# Patient Record
Sex: Female | Born: 1965 | Race: Black or African American | Hispanic: No | Smoking: Current every day smoker
Health system: Southern US, Community
[De-identification: ages and names within clinical notes are randomized; demographics above are authoritative.]

## PROBLEM LIST (undated history)

## (undated) DIAGNOSIS — F1910 Other psychoactive substance abuse, uncomplicated: Secondary | ICD-10-CM

## (undated) DIAGNOSIS — I219 Acute myocardial infarction, unspecified: Secondary | ICD-10-CM

## (undated) DIAGNOSIS — R0601 Orthopnea: Secondary | ICD-10-CM

## (undated) DIAGNOSIS — E559 Vitamin D deficiency, unspecified: Secondary | ICD-10-CM

## (undated) DIAGNOSIS — N200 Calculus of kidney: Secondary | ICD-10-CM

## (undated) DIAGNOSIS — M1990 Unspecified osteoarthritis, unspecified site: Secondary | ICD-10-CM

## (undated) DIAGNOSIS — E1042 Type 1 diabetes mellitus with diabetic polyneuropathy: Secondary | ICD-10-CM

## (undated) DIAGNOSIS — E08621 Diabetes mellitus due to underlying condition with foot ulcer: Secondary | ICD-10-CM

## (undated) DIAGNOSIS — K3184 Gastroparesis: Secondary | ICD-10-CM

## (undated) DIAGNOSIS — Z8719 Personal history of other diseases of the digestive system: Secondary | ICD-10-CM

## (undated) DIAGNOSIS — I10 Essential (primary) hypertension: Secondary | ICD-10-CM

## (undated) DIAGNOSIS — F329 Major depressive disorder, single episode, unspecified: Secondary | ICD-10-CM

## (undated) DIAGNOSIS — Z8711 Personal history of peptic ulcer disease: Secondary | ICD-10-CM

## (undated) DIAGNOSIS — K2210 Ulcer of esophagus without bleeding: Secondary | ICD-10-CM

## (undated) DIAGNOSIS — N181 Chronic kidney disease, stage 1: Secondary | ICD-10-CM

## (undated) DIAGNOSIS — E1069 Type 1 diabetes mellitus with other specified complication: Secondary | ICD-10-CM

## (undated) DIAGNOSIS — E119 Type 2 diabetes mellitus without complications: Secondary | ICD-10-CM

## (undated) DIAGNOSIS — A6000 Herpesviral infection of urogenital system, unspecified: Secondary | ICD-10-CM

## (undated) DIAGNOSIS — R51 Headache: Secondary | ICD-10-CM

## (undated) DIAGNOSIS — N289 Disorder of kidney and ureter, unspecified: Secondary | ICD-10-CM

## (undated) DIAGNOSIS — I200 Unstable angina: Secondary | ICD-10-CM

## (undated) DIAGNOSIS — I2119 ST elevation (STEMI) myocardial infarction involving other coronary artery of inferior wall: Secondary | ICD-10-CM

## (undated) DIAGNOSIS — I639 Cerebral infarction, unspecified: Secondary | ICD-10-CM

## (undated) DIAGNOSIS — E1143 Type 2 diabetes mellitus with diabetic autonomic (poly)neuropathy: Secondary | ICD-10-CM

## (undated) DIAGNOSIS — L723 Sebaceous cyst: Secondary | ICD-10-CM

## (undated) DIAGNOSIS — N1831 Chronic kidney disease, stage 3a: Secondary | ICD-10-CM

## (undated) DIAGNOSIS — F319 Bipolar disorder, unspecified: Secondary | ICD-10-CM

## (undated) DIAGNOSIS — D649 Anemia, unspecified: Secondary | ICD-10-CM

## (undated) DIAGNOSIS — I2129 ST elevation (STEMI) myocardial infarction involving other sites: Secondary | ICD-10-CM

## (undated) DIAGNOSIS — E1142 Type 2 diabetes mellitus with diabetic polyneuropathy: Secondary | ICD-10-CM

## (undated) DIAGNOSIS — K219 Gastro-esophageal reflux disease without esophagitis: Secondary | ICD-10-CM

## (undated) DIAGNOSIS — E1122 Type 2 diabetes mellitus with diabetic chronic kidney disease: Secondary | ICD-10-CM

## (undated) DIAGNOSIS — F419 Anxiety disorder, unspecified: Secondary | ICD-10-CM

## (undated) DIAGNOSIS — N179 Acute kidney failure, unspecified: Secondary | ICD-10-CM

## (undated) DIAGNOSIS — I209 Angina pectoris, unspecified: Secondary | ICD-10-CM

## (undated) DIAGNOSIS — J189 Pneumonia, unspecified organism: Secondary | ICD-10-CM

## (undated) DIAGNOSIS — R011 Cardiac murmur, unspecified: Secondary | ICD-10-CM

## (undated) DIAGNOSIS — K222 Esophageal obstruction: Secondary | ICD-10-CM

## (undated) DIAGNOSIS — M069 Rheumatoid arthritis, unspecified: Secondary | ICD-10-CM

## (undated) DIAGNOSIS — G43909 Migraine, unspecified, not intractable, without status migrainosus: Secondary | ICD-10-CM

## (undated) DIAGNOSIS — F32A Depression, unspecified: Secondary | ICD-10-CM

## (undated) DIAGNOSIS — E1043 Type 1 diabetes mellitus with diabetic autonomic (poly)neuropathy: Secondary | ICD-10-CM

## (undated) DIAGNOSIS — E785 Hyperlipidemia, unspecified: Secondary | ICD-10-CM

## (undated) DIAGNOSIS — L97509 Non-pressure chronic ulcer of other part of unspecified foot with unspecified severity: Secondary | ICD-10-CM

## (undated) DIAGNOSIS — R519 Headache, unspecified: Secondary | ICD-10-CM

## (undated) DIAGNOSIS — I2510 Atherosclerotic heart disease of native coronary artery without angina pectoris: Secondary | ICD-10-CM

## (undated) HISTORY — DX: Esophageal obstruction: K22.2

## (undated) HISTORY — DX: Type 1 diabetes mellitus with other specified complication: E10.69

## (undated) HISTORY — DX: Gastro-esophageal reflux disease without esophagitis: K21.9

## (undated) HISTORY — DX: Ulcer of esophagus without bleeding: K22.10

## (undated) HISTORY — DX: Type 1 diabetes mellitus with diabetic autonomic (poly)neuropathy: K31.84

## (undated) HISTORY — DX: Disorder of kidney and ureter, unspecified: N28.9

## (undated) HISTORY — DX: Type 1 diabetes mellitus with diabetic autonomic (poly)neuropathy: E10.43

## (undated) HISTORY — DX: Type 1 diabetes mellitus with diabetic polyneuropathy: E10.42

## (undated) HISTORY — DX: Gastroparesis: K31.84

## (undated) HISTORY — DX: Anxiety disorder, unspecified: F41.9

## (undated) HISTORY — DX: Chronic kidney disease, stage 1: E11.22

## (undated) HISTORY — DX: Vitamin D deficiency, unspecified: E55.9

## (undated) HISTORY — DX: Type 2 diabetes mellitus with diabetic polyneuropathy: E11.42

## (undated) HISTORY — DX: Atherosclerotic heart disease of native coronary artery without angina pectoris: I25.10

## (undated) HISTORY — DX: Calculus of kidney: N20.0

## (undated) HISTORY — PX: EYE SURGERY: SHX253

## (undated) HISTORY — DX: Sebaceous cyst: L72.3

## (undated) HISTORY — DX: Acute kidney failure, unspecified: N17.9

## (undated) HISTORY — DX: Depression, unspecified: F32.A

## (undated) HISTORY — DX: Non-pressure chronic ulcer of other part of unspecified foot with unspecified severity: L97.509

## (undated) HISTORY — DX: Major depressive disorder, single episode, unspecified: F32.9

## (undated) HISTORY — DX: Herpesviral infection of urogenital system, unspecified: A60.00

## (undated) HISTORY — DX: Type 2 diabetes mellitus with diabetic autonomic (poly)neuropathy: E11.43

## (undated) HISTORY — DX: Diabetes mellitus due to underlying condition with foot ulcer: E08.621

## (undated) HISTORY — DX: Chronic kidney disease, stage 3a: N18.31

## (undated) HISTORY — DX: Type 2 diabetes mellitus with diabetic autonomic (poly)neuropathy: K31.84

## (undated) HISTORY — DX: Other psychoactive substance abuse, uncomplicated: F19.10

## (undated) HISTORY — DX: Unstable angina: I20.0

## (undated) HISTORY — DX: Hyperlipidemia, unspecified: E78.5

## (undated) HISTORY — DX: Type 2 diabetes mellitus with diabetic chronic kidney disease: N18.1

## (undated) HISTORY — DX: Anemia, unspecified: D64.9

## (undated) HISTORY — DX: Rheumatoid arthritis, unspecified: M06.9

---

## 1998-12-07 ENCOUNTER — Emergency Department (HOSPITAL_COMMUNITY): Admission: EM | Admit: 1998-12-07 | Discharge: 1998-12-07 | Payer: Self-pay | Admitting: Emergency Medicine

## 1998-12-07 ENCOUNTER — Encounter: Payer: Self-pay | Admitting: Emergency Medicine

## 1998-12-08 ENCOUNTER — Inpatient Hospital Stay (HOSPITAL_COMMUNITY): Admission: EM | Admit: 1998-12-08 | Discharge: 1998-12-12 | Payer: Self-pay | Admitting: Emergency Medicine

## 1998-12-09 ENCOUNTER — Encounter: Payer: Self-pay | Admitting: Internal Medicine

## 1999-08-31 ENCOUNTER — Encounter: Payer: Self-pay | Admitting: Emergency Medicine

## 1999-08-31 ENCOUNTER — Emergency Department (HOSPITAL_COMMUNITY): Admission: EM | Admit: 1999-08-31 | Discharge: 1999-08-31 | Payer: Self-pay | Admitting: Emergency Medicine

## 1999-09-05 ENCOUNTER — Emergency Department (HOSPITAL_COMMUNITY): Admission: EM | Admit: 1999-09-05 | Discharge: 1999-09-05 | Payer: Self-pay | Admitting: Emergency Medicine

## 1999-12-31 ENCOUNTER — Encounter: Payer: Self-pay | Admitting: Emergency Medicine

## 1999-12-31 ENCOUNTER — Inpatient Hospital Stay (HOSPITAL_COMMUNITY): Admission: EM | Admit: 1999-12-31 | Discharge: 2000-01-03 | Payer: Self-pay | Admitting: Emergency Medicine

## 2000-01-11 ENCOUNTER — Encounter: Admission: RE | Admit: 2000-01-11 | Discharge: 2000-01-11 | Payer: Self-pay | Admitting: Hematology and Oncology

## 2000-07-28 ENCOUNTER — Emergency Department (HOSPITAL_COMMUNITY): Admission: EM | Admit: 2000-07-28 | Discharge: 2000-07-28 | Payer: Self-pay | Admitting: Emergency Medicine

## 2000-07-31 ENCOUNTER — Emergency Department (HOSPITAL_COMMUNITY): Admission: EM | Admit: 2000-07-31 | Discharge: 2000-07-31 | Payer: Self-pay | Admitting: Emergency Medicine

## 2000-08-03 ENCOUNTER — Inpatient Hospital Stay (HOSPITAL_COMMUNITY): Admission: EM | Admit: 2000-08-03 | Discharge: 2000-08-09 | Payer: Self-pay | Admitting: Emergency Medicine

## 2000-08-05 ENCOUNTER — Encounter: Payer: Self-pay | Admitting: Pulmonary Disease

## 2000-08-05 ENCOUNTER — Encounter: Payer: Self-pay | Admitting: Internal Medicine

## 2000-08-06 ENCOUNTER — Encounter: Payer: Self-pay | Admitting: Internal Medicine

## 2000-08-06 ENCOUNTER — Encounter: Payer: Self-pay | Admitting: Pulmonary Disease

## 2000-10-30 ENCOUNTER — Encounter: Payer: Self-pay | Admitting: Emergency Medicine

## 2000-10-30 ENCOUNTER — Inpatient Hospital Stay (HOSPITAL_COMMUNITY): Admission: EM | Admit: 2000-10-30 | Discharge: 2000-11-08 | Payer: Self-pay | Admitting: Emergency Medicine

## 2000-11-01 ENCOUNTER — Encounter: Payer: Self-pay | Admitting: Internal Medicine

## 2000-11-07 ENCOUNTER — Encounter: Payer: Self-pay | Admitting: Internal Medicine

## 2000-11-10 ENCOUNTER — Inpatient Hospital Stay (HOSPITAL_COMMUNITY): Admission: EM | Admit: 2000-11-10 | Discharge: 2000-11-16 | Payer: Self-pay | Admitting: Emergency Medicine

## 2000-11-10 ENCOUNTER — Encounter: Payer: Self-pay | Admitting: Internal Medicine

## 2000-11-13 ENCOUNTER — Encounter: Payer: Self-pay | Admitting: Internal Medicine

## 2000-11-20 ENCOUNTER — Encounter: Admission: RE | Admit: 2000-11-20 | Discharge: 2000-11-20 | Payer: Self-pay | Admitting: Internal Medicine

## 2000-11-28 ENCOUNTER — Encounter: Admission: RE | Admit: 2000-11-28 | Discharge: 2000-11-28 | Payer: Self-pay

## 2001-03-20 ENCOUNTER — Encounter: Payer: Self-pay | Admitting: Internal Medicine

## 2001-03-20 ENCOUNTER — Encounter: Payer: Self-pay | Admitting: Emergency Medicine

## 2001-03-20 ENCOUNTER — Inpatient Hospital Stay (HOSPITAL_COMMUNITY): Admission: EM | Admit: 2001-03-20 | Discharge: 2001-03-24 | Payer: Self-pay | Admitting: Emergency Medicine

## 2001-03-21 ENCOUNTER — Encounter: Payer: Self-pay | Admitting: Internal Medicine

## 2001-03-25 ENCOUNTER — Emergency Department (HOSPITAL_COMMUNITY): Admission: EM | Admit: 2001-03-25 | Discharge: 2001-03-25 | Payer: Self-pay

## 2001-03-29 ENCOUNTER — Encounter: Payer: Self-pay | Admitting: Emergency Medicine

## 2001-03-29 ENCOUNTER — Inpatient Hospital Stay (HOSPITAL_COMMUNITY): Admission: EM | Admit: 2001-03-29 | Discharge: 2001-04-02 | Payer: Self-pay | Admitting: Emergency Medicine

## 2001-04-01 ENCOUNTER — Encounter: Payer: Self-pay | Admitting: Internal Medicine

## 2001-05-28 ENCOUNTER — Encounter: Payer: Self-pay | Admitting: Internal Medicine

## 2001-05-28 ENCOUNTER — Inpatient Hospital Stay (HOSPITAL_COMMUNITY): Admission: EM | Admit: 2001-05-28 | Discharge: 2001-05-30 | Payer: Self-pay | Admitting: Emergency Medicine

## 2001-05-28 ENCOUNTER — Encounter: Payer: Self-pay | Admitting: Emergency Medicine

## 2001-05-29 ENCOUNTER — Encounter: Payer: Self-pay | Admitting: Internal Medicine

## 2001-06-12 ENCOUNTER — Encounter: Payer: Self-pay | Admitting: Emergency Medicine

## 2001-06-12 ENCOUNTER — Inpatient Hospital Stay (HOSPITAL_COMMUNITY): Admission: EM | Admit: 2001-06-12 | Discharge: 2001-06-16 | Payer: Self-pay | Admitting: Emergency Medicine

## 2001-06-13 ENCOUNTER — Encounter: Payer: Self-pay | Admitting: Family Medicine

## 2001-06-20 ENCOUNTER — Encounter: Admission: RE | Admit: 2001-06-20 | Discharge: 2001-06-20 | Payer: Self-pay | Admitting: Family Medicine

## 2001-06-24 ENCOUNTER — Inpatient Hospital Stay (HOSPITAL_COMMUNITY): Admission: EM | Admit: 2001-06-24 | Discharge: 2001-06-27 | Payer: Self-pay | Admitting: *Deleted

## 2001-06-24 ENCOUNTER — Encounter: Payer: Self-pay | Admitting: *Deleted

## 2001-06-24 ENCOUNTER — Encounter: Payer: Self-pay | Admitting: Internal Medicine

## 2001-09-17 ENCOUNTER — Encounter: Payer: Self-pay | Admitting: Emergency Medicine

## 2001-09-17 ENCOUNTER — Inpatient Hospital Stay (HOSPITAL_COMMUNITY): Admission: EM | Admit: 2001-09-17 | Discharge: 2001-09-22 | Payer: Self-pay

## 2001-09-18 ENCOUNTER — Encounter: Payer: Self-pay | Admitting: Family Medicine

## 2001-09-19 ENCOUNTER — Encounter: Payer: Self-pay | Admitting: Family Medicine

## 2002-01-01 ENCOUNTER — Encounter: Payer: Self-pay | Admitting: Internal Medicine

## 2002-01-01 ENCOUNTER — Inpatient Hospital Stay (HOSPITAL_COMMUNITY): Admission: EM | Admit: 2002-01-01 | Discharge: 2002-01-04 | Payer: Self-pay | Admitting: Emergency Medicine

## 2002-09-22 ENCOUNTER — Encounter: Admission: RE | Admit: 2002-09-22 | Discharge: 2002-09-22 | Payer: Self-pay | Admitting: Family Medicine

## 2002-09-22 ENCOUNTER — Encounter: Payer: Self-pay | Admitting: Family Medicine

## 2003-01-30 ENCOUNTER — Encounter: Payer: Self-pay | Admitting: Emergency Medicine

## 2003-01-30 ENCOUNTER — Observation Stay (HOSPITAL_COMMUNITY): Admission: EM | Admit: 2003-01-30 | Discharge: 2003-01-31 | Payer: Self-pay

## 2003-07-11 ENCOUNTER — Emergency Department (HOSPITAL_COMMUNITY): Admission: EM | Admit: 2003-07-11 | Discharge: 2003-07-12 | Payer: Self-pay | Admitting: Emergency Medicine

## 2004-04-05 ENCOUNTER — Ambulatory Visit: Payer: Self-pay | Admitting: *Deleted

## 2004-04-18 ENCOUNTER — Ambulatory Visit: Payer: Self-pay | Admitting: Family Medicine

## 2004-05-15 ENCOUNTER — Inpatient Hospital Stay (HOSPITAL_COMMUNITY): Admission: EM | Admit: 2004-05-15 | Discharge: 2004-05-19 | Payer: Self-pay | Admitting: Emergency Medicine

## 2004-05-15 ENCOUNTER — Ambulatory Visit: Payer: Self-pay | Admitting: Family Medicine

## 2004-05-23 ENCOUNTER — Ambulatory Visit: Payer: Self-pay | Admitting: Family Medicine

## 2004-06-27 ENCOUNTER — Ambulatory Visit: Payer: Self-pay | Admitting: Family Medicine

## 2004-07-24 ENCOUNTER — Emergency Department (HOSPITAL_COMMUNITY): Admission: EM | Admit: 2004-07-24 | Discharge: 2004-07-24 | Payer: Self-pay | Admitting: Emergency Medicine

## 2004-10-09 ENCOUNTER — Emergency Department (HOSPITAL_COMMUNITY): Admission: EM | Admit: 2004-10-09 | Discharge: 2004-10-09 | Payer: Self-pay | Admitting: Emergency Medicine

## 2004-12-13 ENCOUNTER — Ambulatory Visit: Payer: Self-pay | Admitting: Family Medicine

## 2004-12-15 IMAGING — CR DG CHEST 1V PORT
1 series · 1 of 1 positions shown · non-contrast
Comparison: none

CLINICAL DATA: Chest pain. 
 CHEST PORTABLE, ONE VIEW 05/15/04 AT 5761 HOURS
 The heart size and mediastinal contours are within normal limits.  The lungs are clear.
 IMPRESSION
 No acute disease.

[view not recorded]
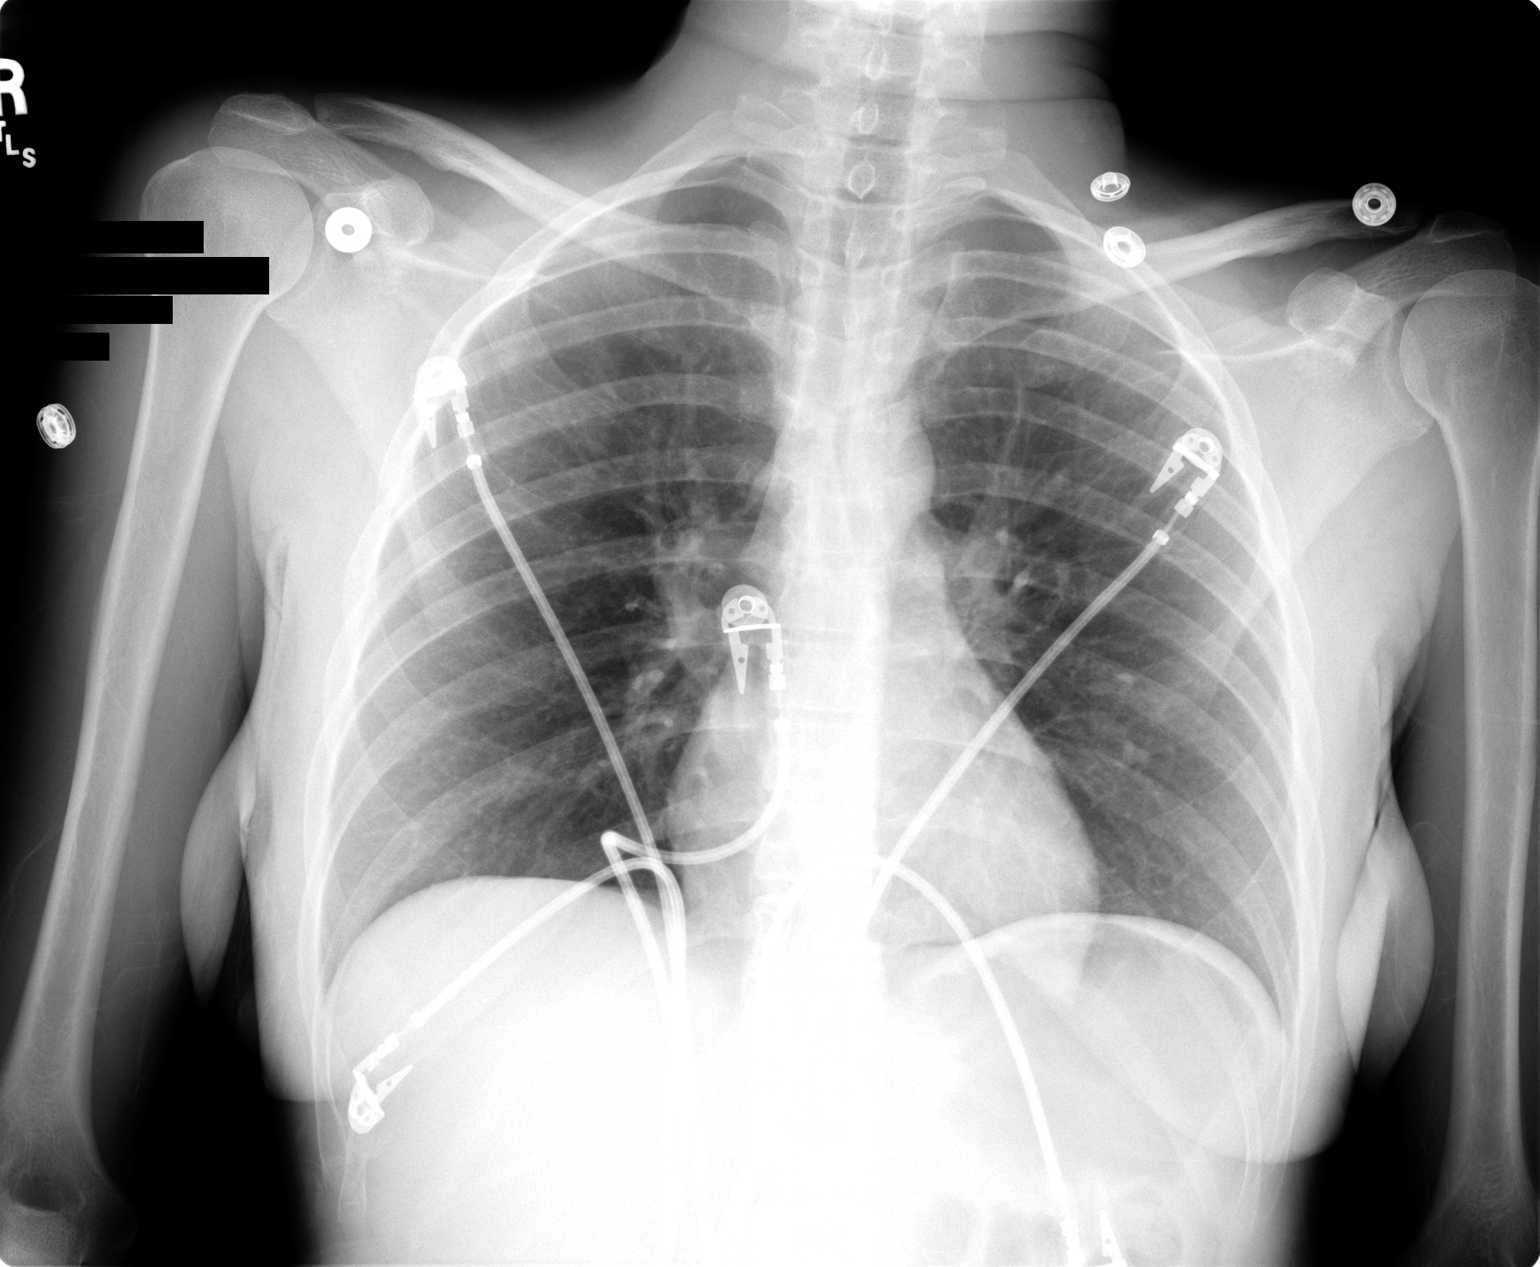

[1 of 1 positions shown; findings below may reference images not displayed]

## 2005-02-23 ENCOUNTER — Ambulatory Visit: Payer: Self-pay | Admitting: Family Medicine

## 2005-02-23 IMAGING — CR DG SHOULDER 2+V*L*
3 series · 3 of 3 positions shown · non-contrast
Comparison: none

CLINICAL DATA: Lt shoulder pain.
 THREE VIEWS OF THE LEFT SHOULDER:
 Glenohumeral alignment is anatomic.  Negative for fracture or subluxation.  AC joint degenerative changes appreciated.

[view not recorded (1 of 3)]
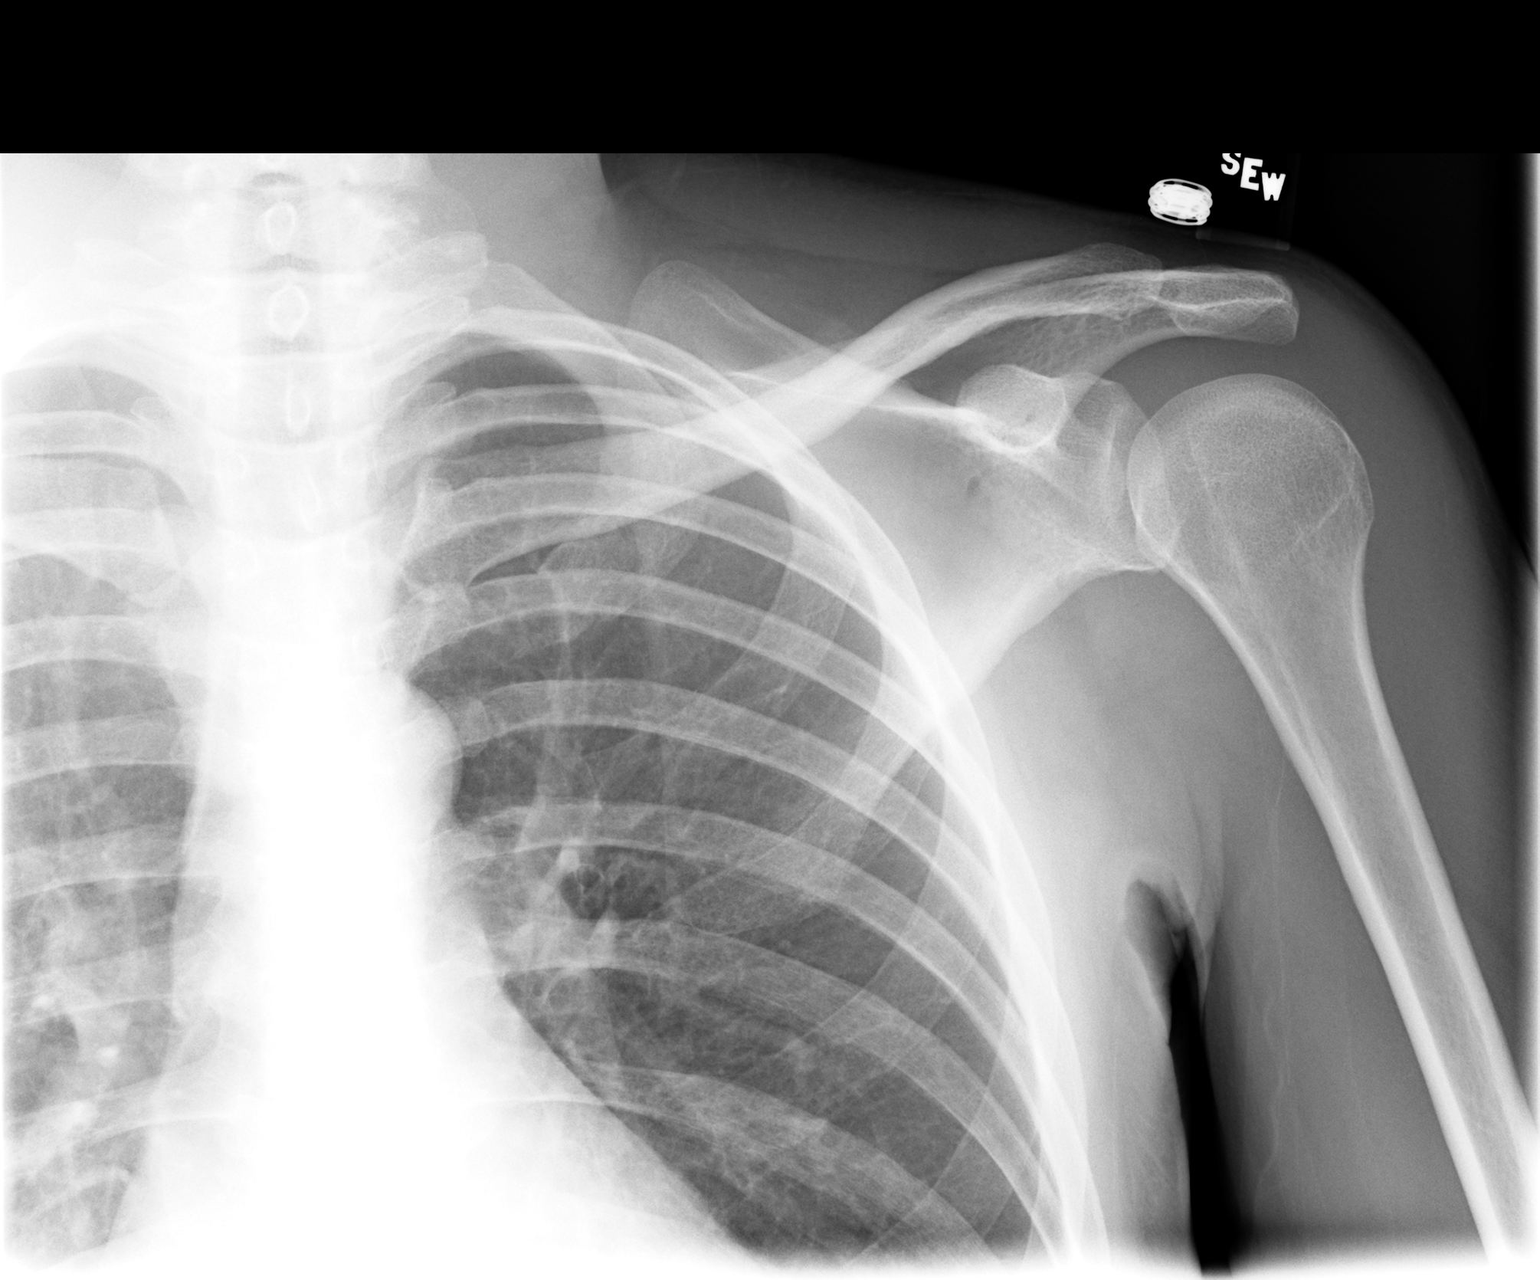

[view not recorded (2 of 3)]
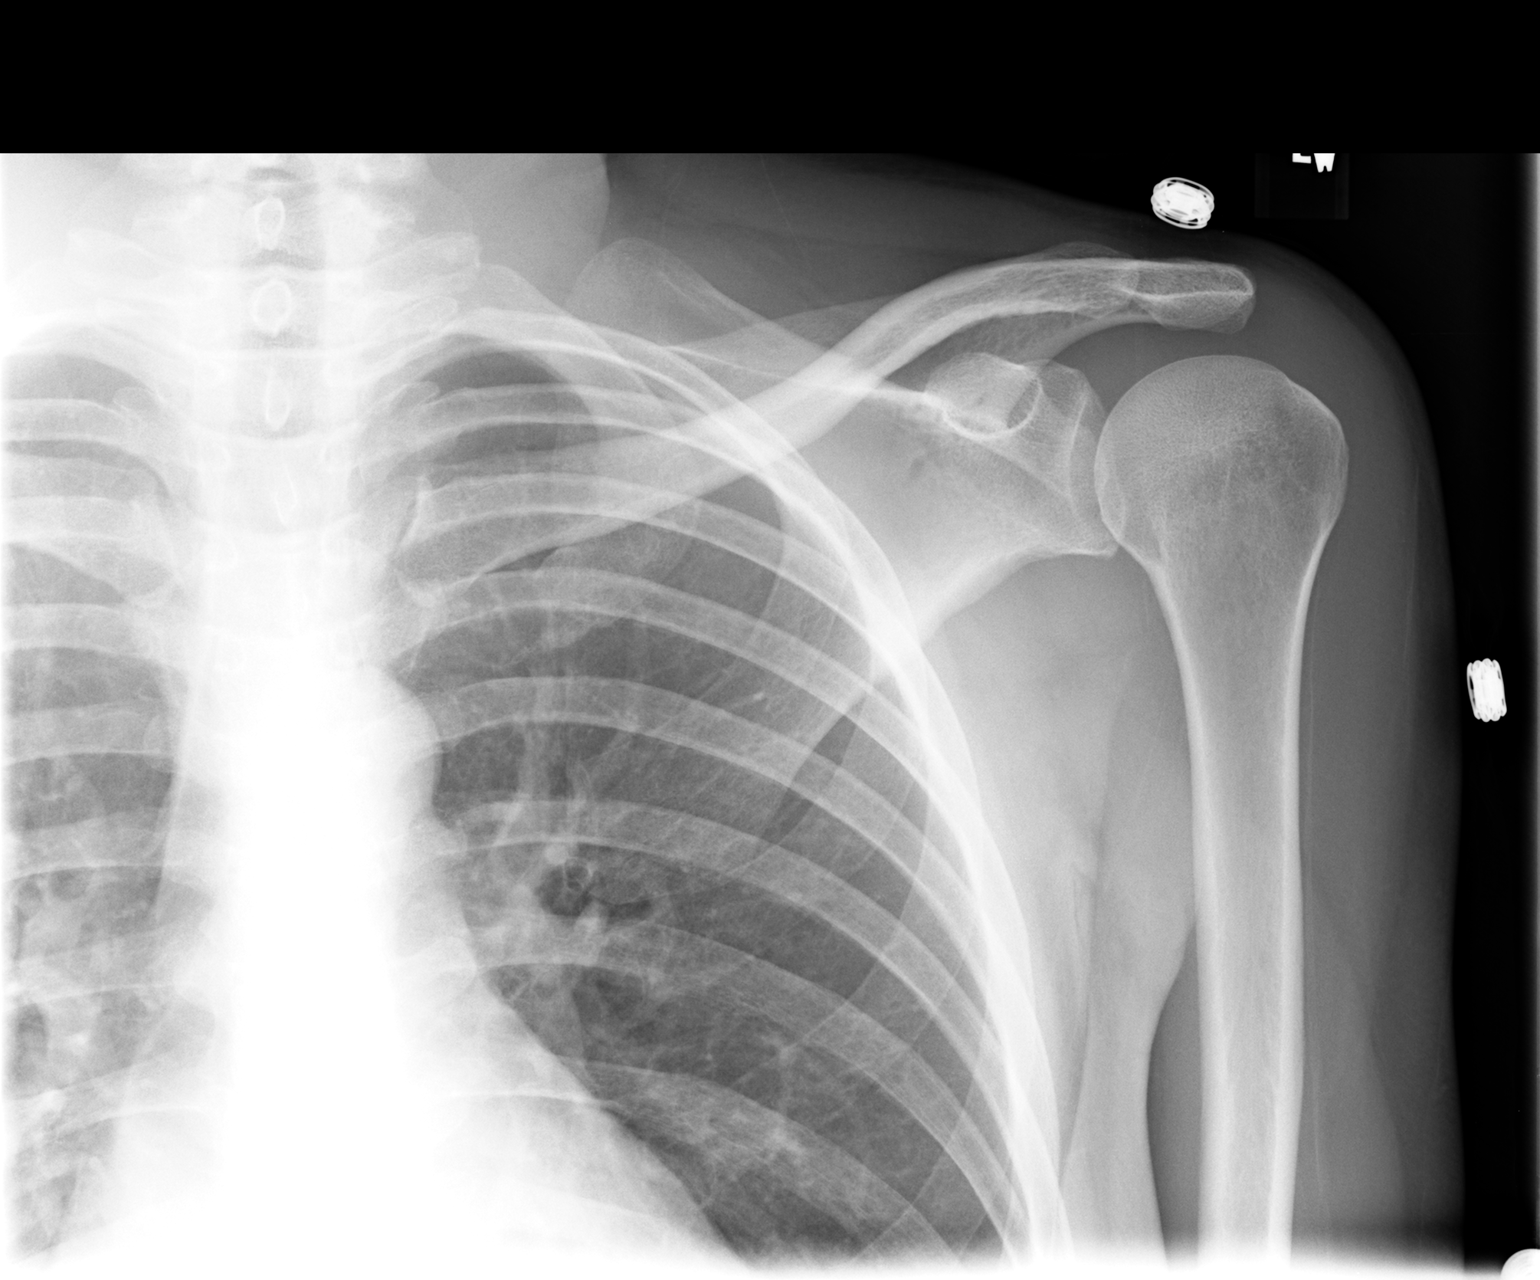

[view not recorded (3 of 3)]
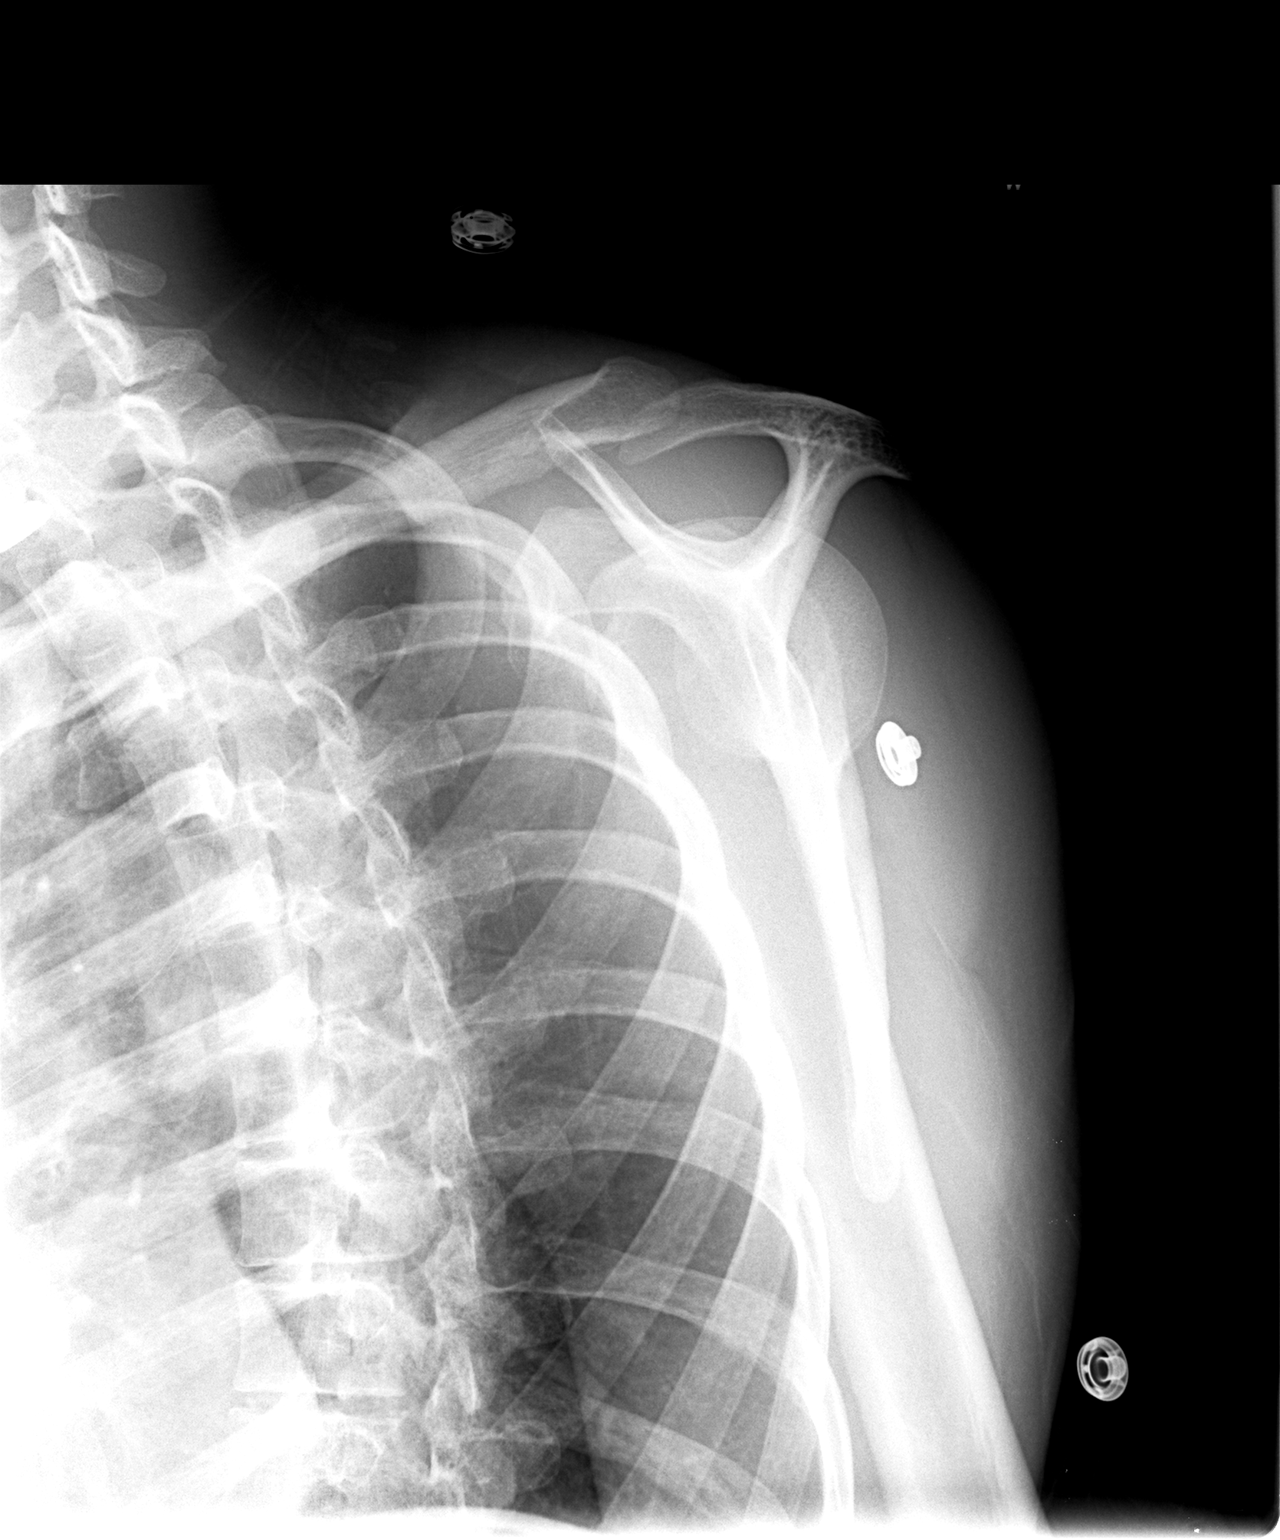

[3 of 3 positions shown; findings below may reference images not displayed]

IMPRESSION: Degenerative changes.  Negative for fracture or subluxation.

## 2005-05-15 ENCOUNTER — Ambulatory Visit: Payer: Self-pay | Admitting: Family Medicine

## 2005-07-12 ENCOUNTER — Ambulatory Visit: Payer: Self-pay | Admitting: Family Medicine

## 2005-08-11 ENCOUNTER — Ambulatory Visit: Payer: Self-pay | Admitting: Family Medicine

## 2005-08-22 ENCOUNTER — Ambulatory Visit: Payer: Self-pay | Admitting: Family Medicine

## 2005-08-30 ENCOUNTER — Ambulatory Visit (HOSPITAL_COMMUNITY): Admission: RE | Admit: 2005-08-30 | Discharge: 2005-08-30 | Payer: Self-pay | Admitting: Internal Medicine

## 2005-08-30 ENCOUNTER — Ambulatory Visit: Payer: Self-pay | Admitting: Family Medicine

## 2005-10-31 ENCOUNTER — Encounter (INDEPENDENT_AMBULATORY_CARE_PROVIDER_SITE_OTHER): Payer: Self-pay | Admitting: Family Medicine

## 2005-10-31 LAB — CONVERTED CEMR LAB

## 2005-11-08 ENCOUNTER — Encounter (INDEPENDENT_AMBULATORY_CARE_PROVIDER_SITE_OTHER): Payer: Self-pay | Admitting: Family Medicine

## 2005-11-08 ENCOUNTER — Ambulatory Visit: Payer: Self-pay | Admitting: Family Medicine

## 2005-12-05 ENCOUNTER — Ambulatory Visit: Payer: Self-pay | Admitting: Family Medicine

## 2005-12-22 ENCOUNTER — Ambulatory Visit: Payer: Self-pay | Admitting: Family Medicine

## 2006-01-08 ENCOUNTER — Ambulatory Visit: Payer: Self-pay | Admitting: Family Medicine

## 2006-02-12 ENCOUNTER — Ambulatory Visit: Payer: Self-pay | Admitting: Family Medicine

## 2006-03-28 ENCOUNTER — Ambulatory Visit: Payer: Self-pay | Admitting: Family Medicine

## 2006-05-09 ENCOUNTER — Ambulatory Visit: Payer: Self-pay | Admitting: Family Medicine

## 2006-06-01 ENCOUNTER — Emergency Department (HOSPITAL_COMMUNITY): Admission: EM | Admit: 2006-06-01 | Discharge: 2006-06-02 | Payer: Self-pay | Admitting: Emergency Medicine

## 2006-06-03 ENCOUNTER — Inpatient Hospital Stay (HOSPITAL_COMMUNITY): Admission: EM | Admit: 2006-06-03 | Discharge: 2006-06-09 | Payer: Self-pay | Admitting: Emergency Medicine

## 2006-06-11 ENCOUNTER — Ambulatory Visit: Payer: Self-pay | Admitting: Family Medicine

## 2006-06-11 ENCOUNTER — Emergency Department (HOSPITAL_COMMUNITY): Admission: EM | Admit: 2006-06-11 | Discharge: 2006-06-12 | Payer: Self-pay | Admitting: Emergency Medicine

## 2006-06-13 ENCOUNTER — Emergency Department (HOSPITAL_COMMUNITY): Admission: EM | Admit: 2006-06-13 | Discharge: 2006-06-13 | Payer: Self-pay | Admitting: Emergency Medicine

## 2006-06-14 ENCOUNTER — Inpatient Hospital Stay (HOSPITAL_COMMUNITY): Admission: EM | Admit: 2006-06-14 | Discharge: 2006-06-21 | Payer: Self-pay | Admitting: Emergency Medicine

## 2006-06-21 DIAGNOSIS — E118 Type 2 diabetes mellitus with unspecified complications: Secondary | ICD-10-CM | POA: Insufficient documentation

## 2006-06-21 DIAGNOSIS — E108 Type 1 diabetes mellitus with unspecified complications: Secondary | ICD-10-CM

## 2006-06-21 HISTORY — DX: Type 1 diabetes mellitus with unspecified complications: E10.8

## 2006-06-26 ENCOUNTER — Ambulatory Visit: Payer: Self-pay | Admitting: Family Medicine

## 2006-07-05 ENCOUNTER — Inpatient Hospital Stay (HOSPITAL_COMMUNITY): Admission: EM | Admit: 2006-07-05 | Discharge: 2006-07-18 | Payer: Self-pay | Admitting: Emergency Medicine

## 2006-07-06 ENCOUNTER — Encounter (INDEPENDENT_AMBULATORY_CARE_PROVIDER_SITE_OTHER): Payer: Self-pay | Admitting: Specialist

## 2006-07-06 DIAGNOSIS — K2210 Ulcer of esophagus without bleeding: Secondary | ICD-10-CM | POA: Insufficient documentation

## 2006-07-06 DIAGNOSIS — K222 Esophageal obstruction: Secondary | ICD-10-CM | POA: Insufficient documentation

## 2006-07-26 ENCOUNTER — Ambulatory Visit: Payer: Self-pay | Admitting: Internal Medicine

## 2006-07-30 ENCOUNTER — Ambulatory Visit (HOSPITAL_COMMUNITY): Admission: RE | Admit: 2006-07-30 | Discharge: 2006-07-30 | Payer: Self-pay | Admitting: Gastroenterology

## 2006-08-03 ENCOUNTER — Ambulatory Visit: Payer: Self-pay | Admitting: Family Medicine

## 2006-08-27 ENCOUNTER — Ambulatory Visit: Payer: Self-pay | Admitting: Family Medicine

## 2006-08-30 ENCOUNTER — Emergency Department (HOSPITAL_COMMUNITY): Admission: EM | Admit: 2006-08-30 | Discharge: 2006-08-30 | Payer: Self-pay | Admitting: Emergency Medicine

## 2006-08-30 ENCOUNTER — Ambulatory Visit (HOSPITAL_COMMUNITY): Admission: RE | Admit: 2006-08-30 | Discharge: 2006-08-30 | Payer: Self-pay | Admitting: Gastroenterology

## 2006-09-04 ENCOUNTER — Ambulatory Visit: Payer: Self-pay | Admitting: Family Medicine

## 2006-12-05 ENCOUNTER — Ambulatory Visit: Payer: Self-pay | Admitting: Family Medicine

## 2007-01-01 IMAGING — CR DG ABDOMEN ACUTE W/ 1V CHEST
3 series · 3 of 3 positions shown · non-contrast
Comparison: none

HISTORY: Hyperglycemia, abdominal pain, nausea, vomiting

[view not recorded (1 of 3)]
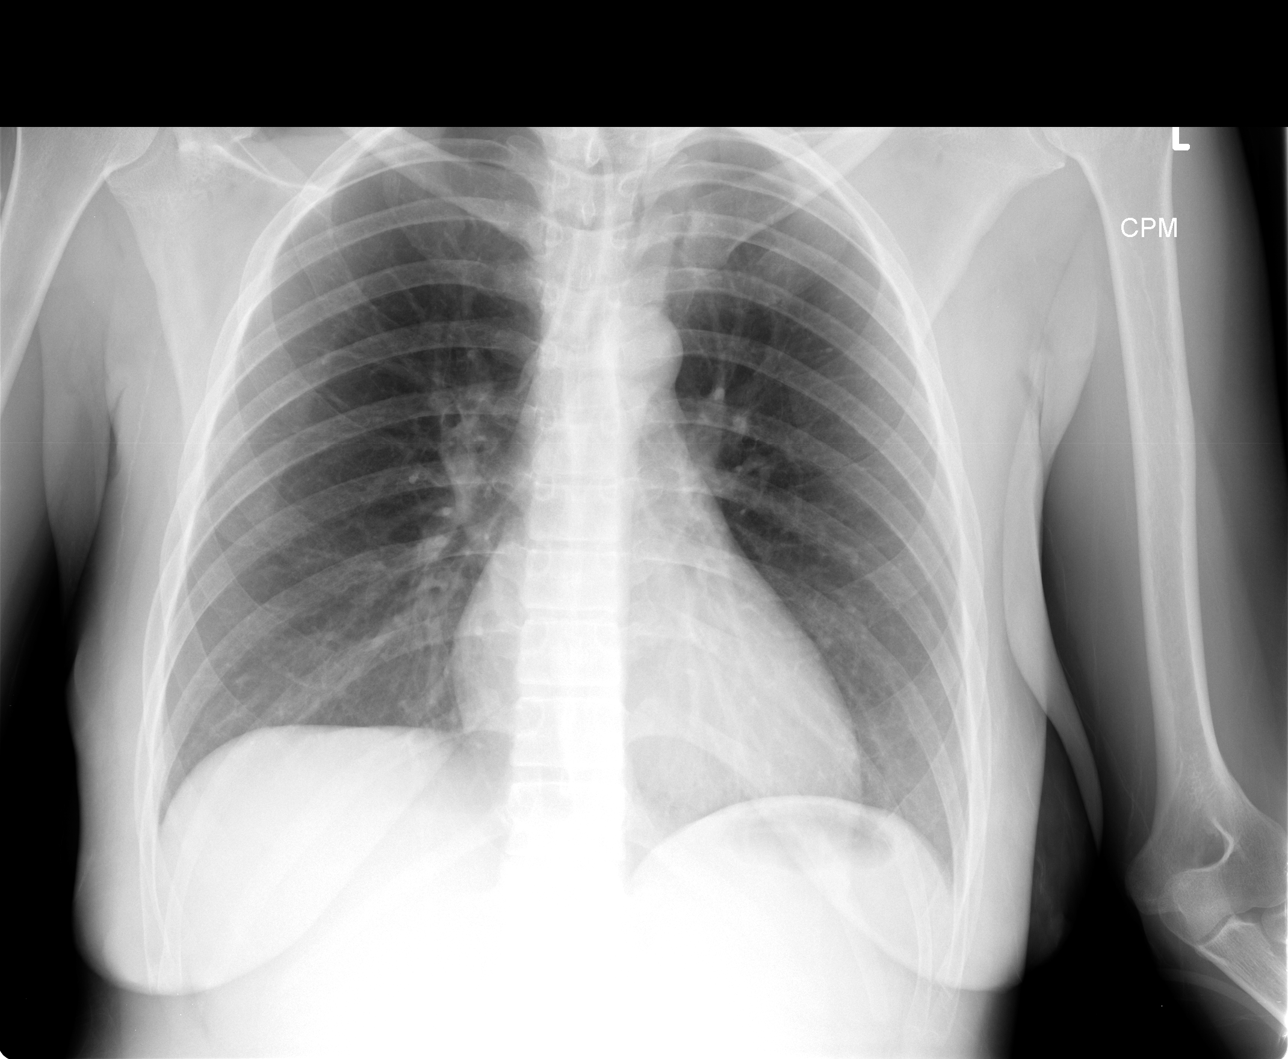

[view not recorded (2 of 3)]
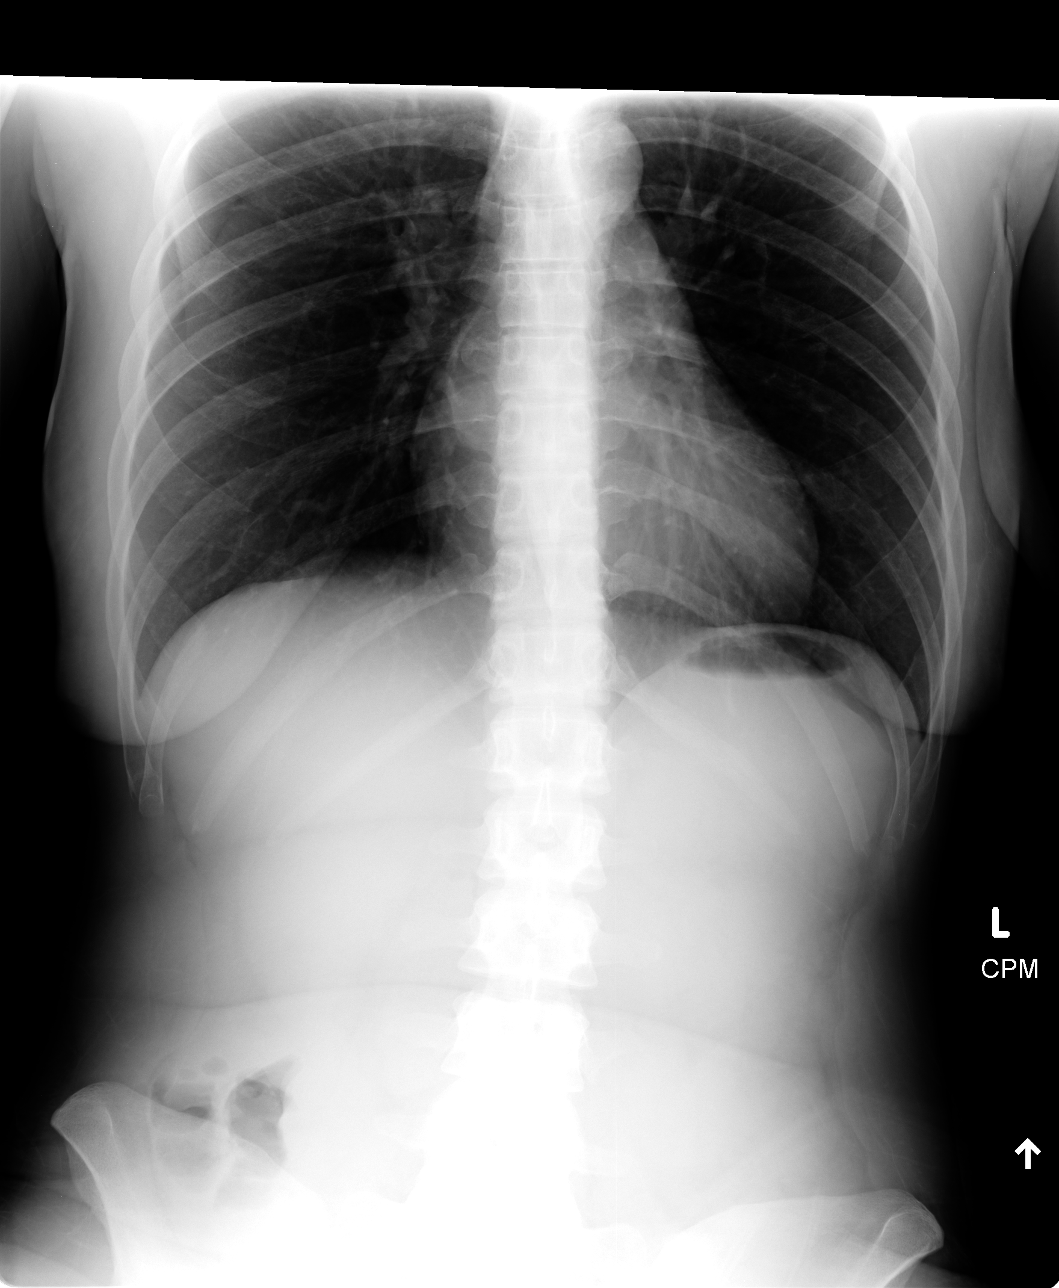

[view not recorded (3 of 3)]
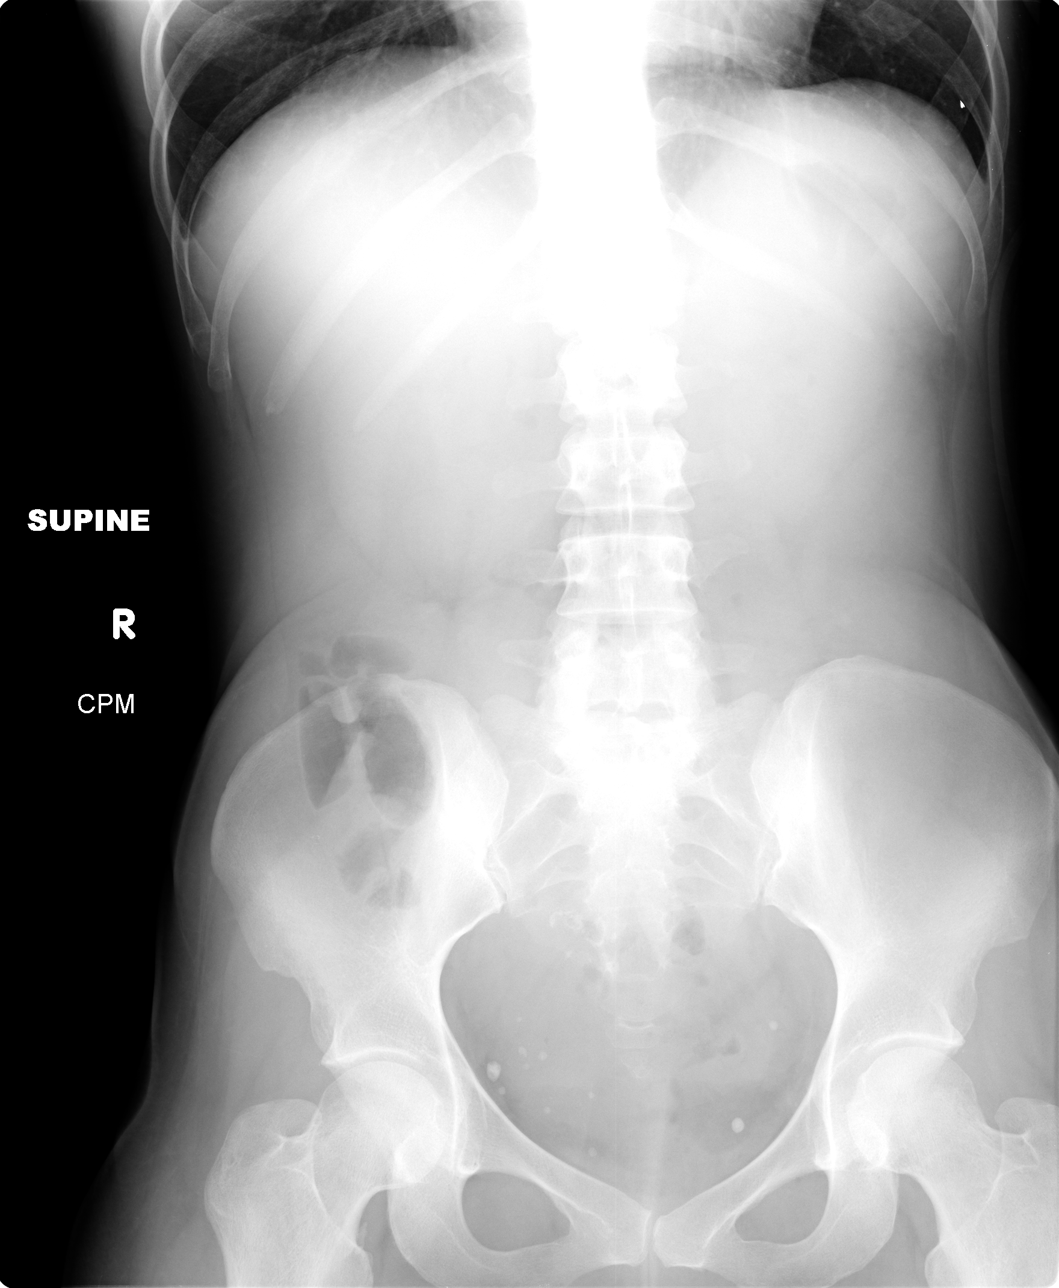

[3 of 3 positions shown; findings below may reference images not displayed]

ABDOMEN ACUTE WITH PA CHEST:

No prior exams available for comparison.

Normal heart size, mediastinal contours, and pulmonary vascularity.
Minimal peribronchial thickening.
No infiltrate or effusion.

Paucity of bowel gas in abdomen.
Numerous pelvic phleboliths.
No definite evidence of bowel obstruction or bowel wall thickening.
Bones unremarkable.
No definite urinary tract calcification.
IMPRESSION: No cardiopulmonary abnormality.
Paucity of bowel gas in abdomen, question fluid filled or decompressed bowel.
No other acute findings.

## 2007-01-02 ENCOUNTER — Emergency Department (HOSPITAL_COMMUNITY): Admission: EM | Admit: 2007-01-02 | Discharge: 2007-01-03 | Payer: Self-pay | Admitting: Emergency Medicine

## 2007-01-03 IMAGING — CR DG CHEST 2V
2 series · 2 of 2 positions shown · non-contrast
Comparison: 05/15/2004

CLINICAL DATA: Nausea and vomiting. Fever.

CHEST - 2 VIEW

[w chest pa]
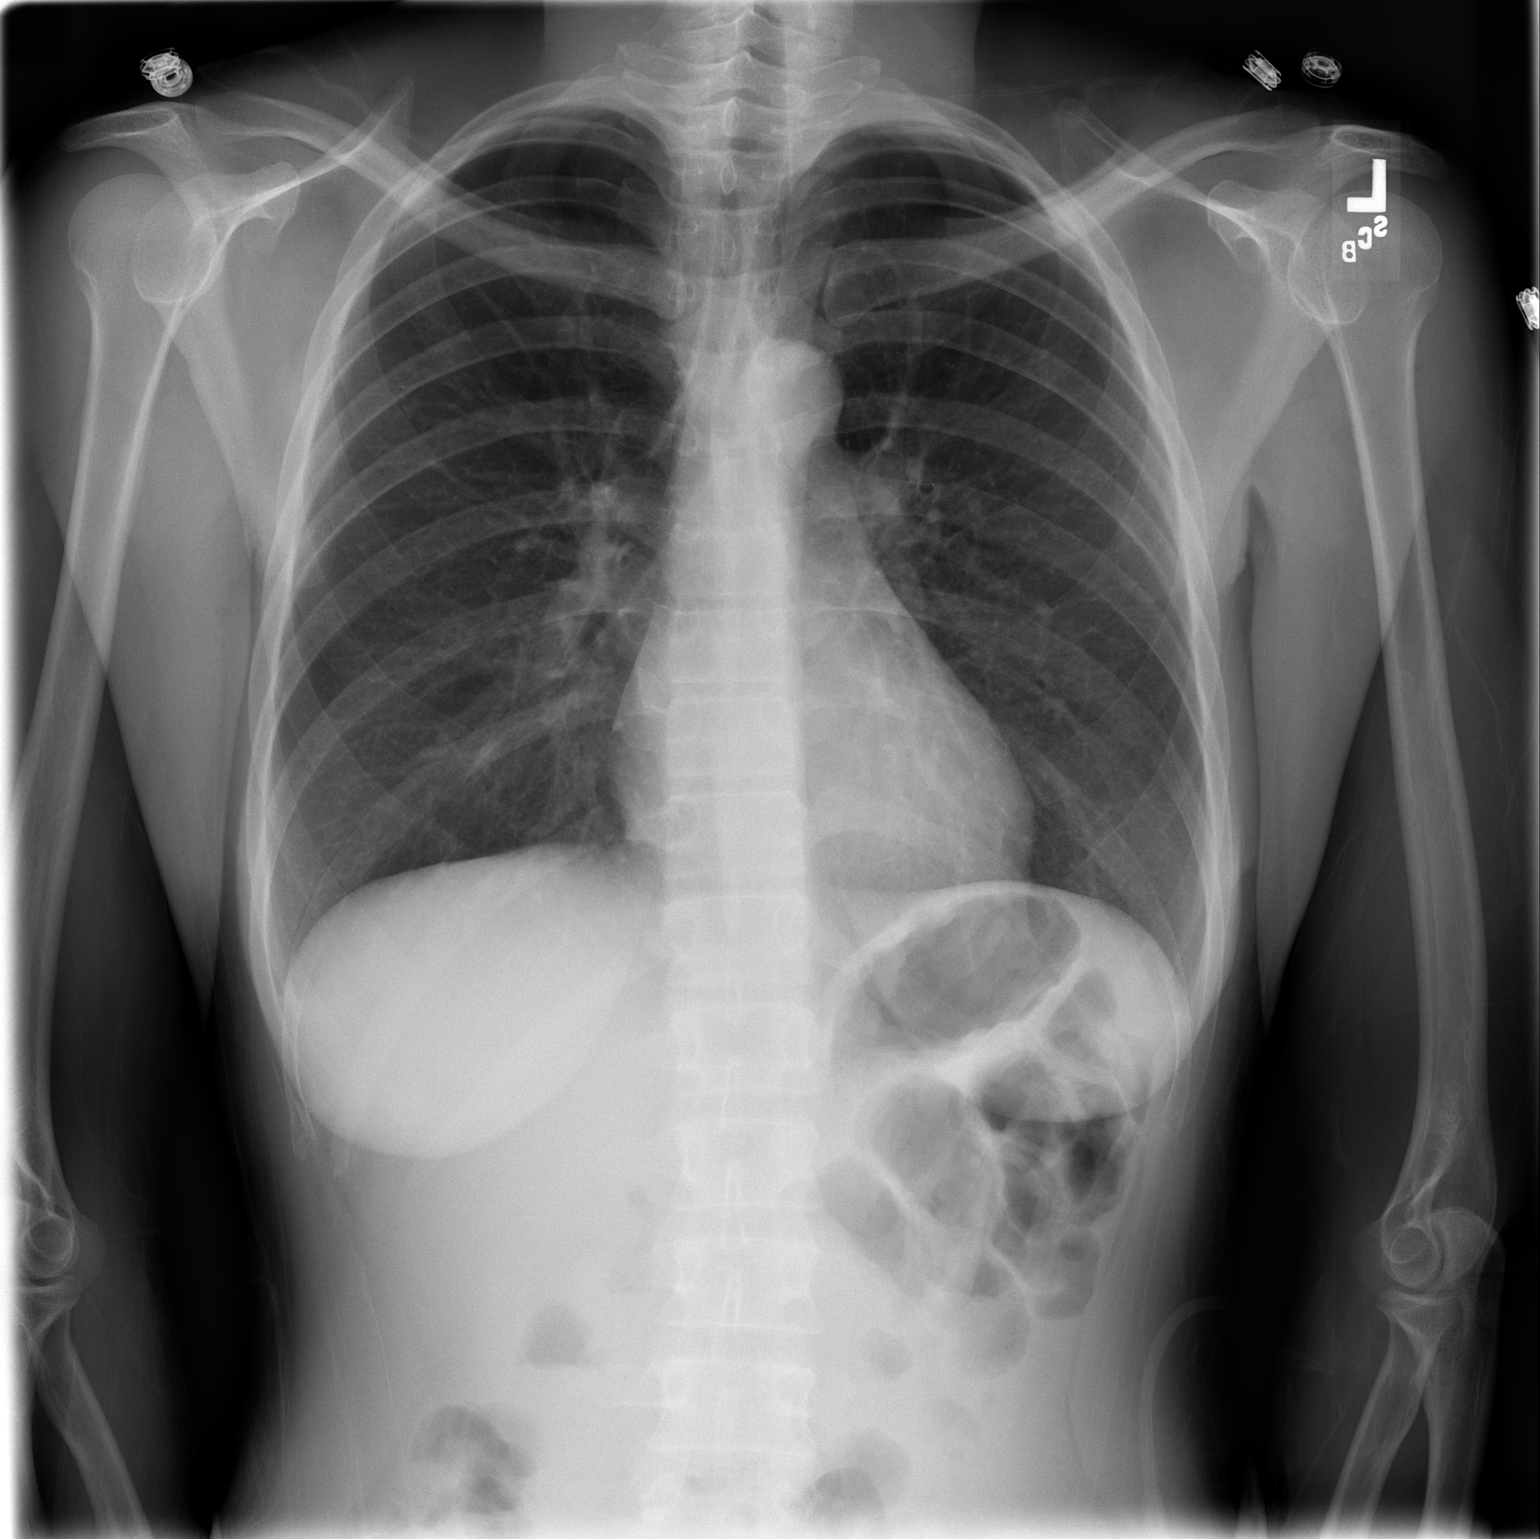

[w chest lat]
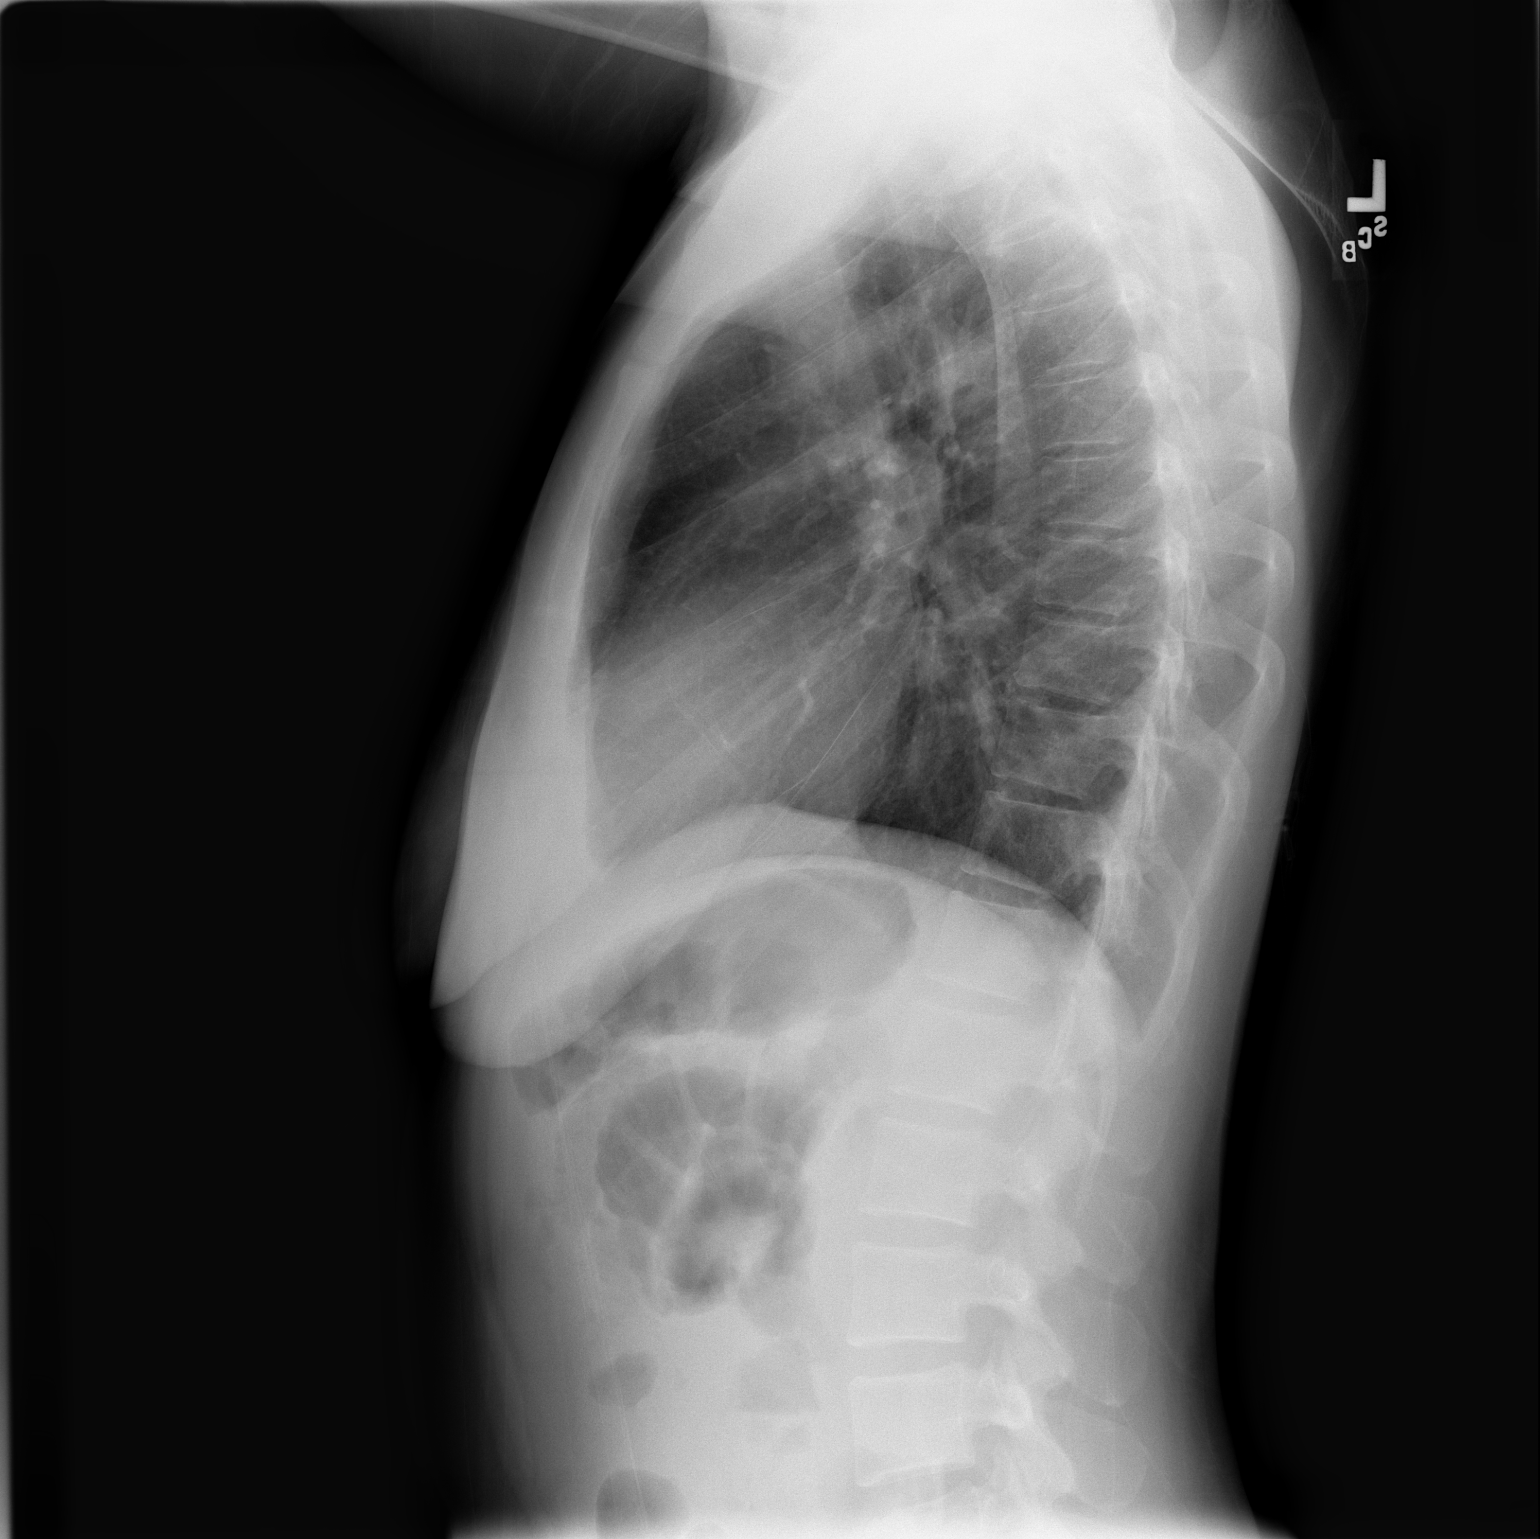

[2 of 2 positions shown; findings below may reference images not displayed]

FINDINGS: The lungs appear clear. The heart and mediastinum appear
unremarkable.

No free intraperitoneal gas is evident. A loop of small bowel in the left upper
quadrant measures 2.9 cm in diameter, which is at the upper limits of the normal
range. There also appears to be gas in the colon.

IMPRESSION

1. No active cardiopulmonary disease is radiographically apparent.
2. There is a borderline dilated loop of small bowel in the left upper quadrant.

## 2007-01-05 IMAGING — CT CT PELVIS W/ CM
2 of 5 series · 17 of 46 positions shown, 19 images · IV contrast (APPLIED)
Comparison: none

CLINICAL DATA: Nausea, vomiting.    
 ABDOMEN CT WITH CONTRAST:
TECHNIQUE: Multidetector CT imaging of the abdomen was performed following the standard protocol during bolus administration of intravenous contrast.
 Contrast:  100 cc Omnipaque 300
TECHNIQUE: Multidetector CT imaging of the pelvis was performed following the standard protocol during bolus administration of intravenous contrast.

[Series 2: abd/pelv with 5.0 b31f st · axial · 0.60mm/px · z∈[-402,-36]mm · 14 of 83 slices shown, 16 images]
[im 5/83  soft-tissue]
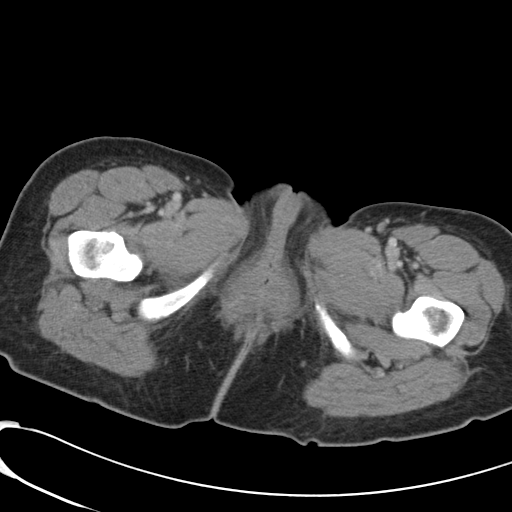
[im 5/83  bone]
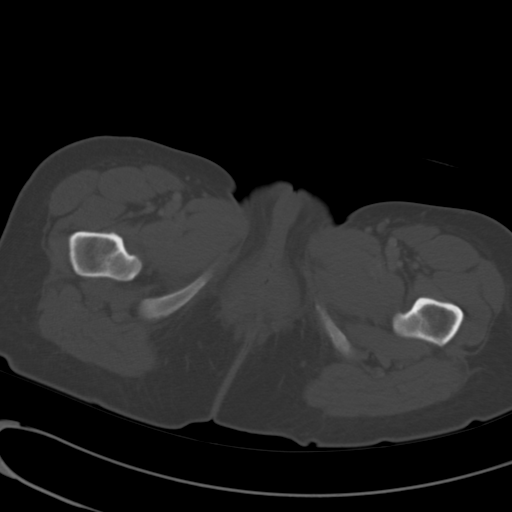
[im 10/83  soft-tissue]
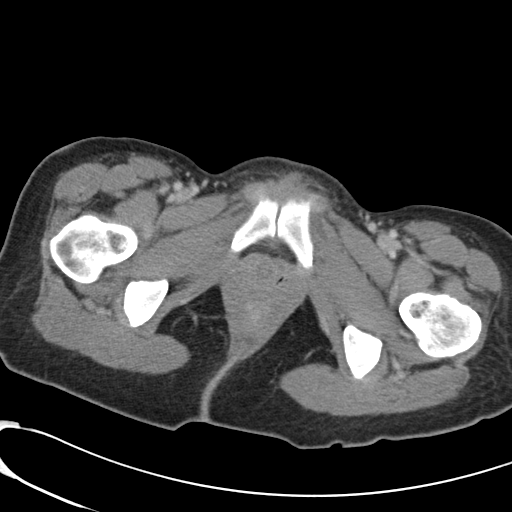
[im 15/83  soft-tissue]
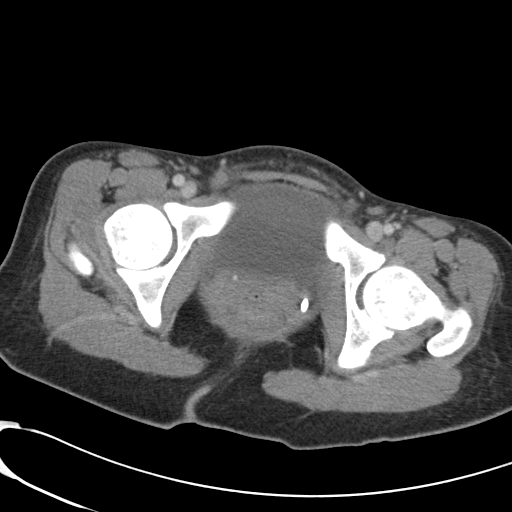
[im 25/83  soft-tissue]
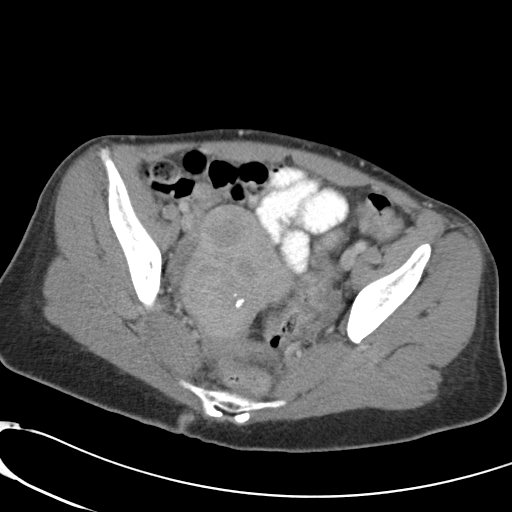
[im 29/83  soft-tissue]
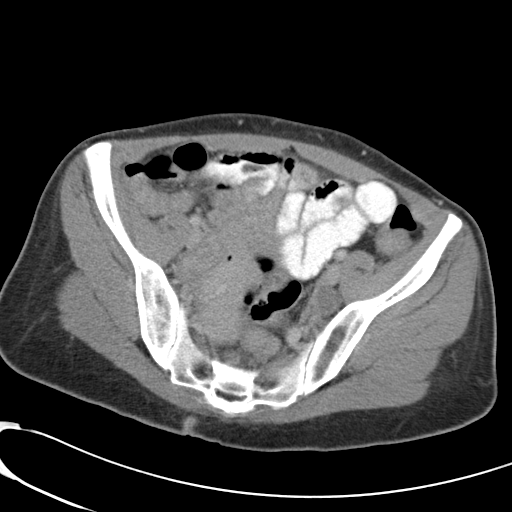
[im 34/83  soft-tissue]
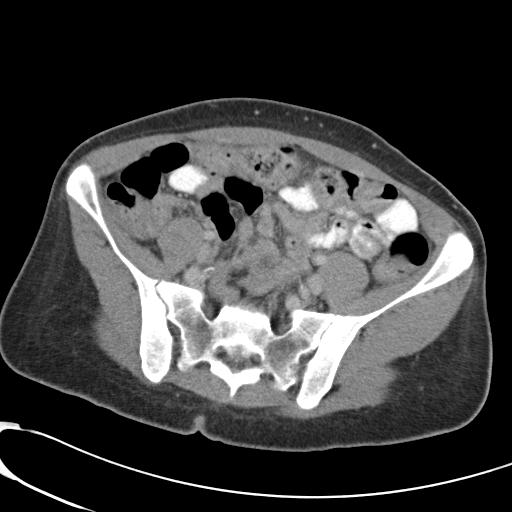
[im 39/83  soft-tissue]
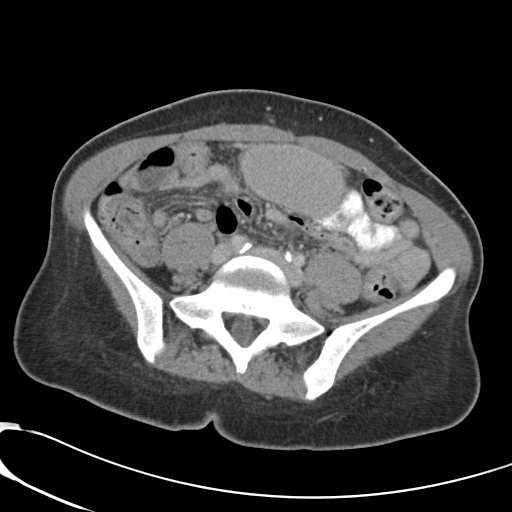
[im 44/83  soft-tissue]
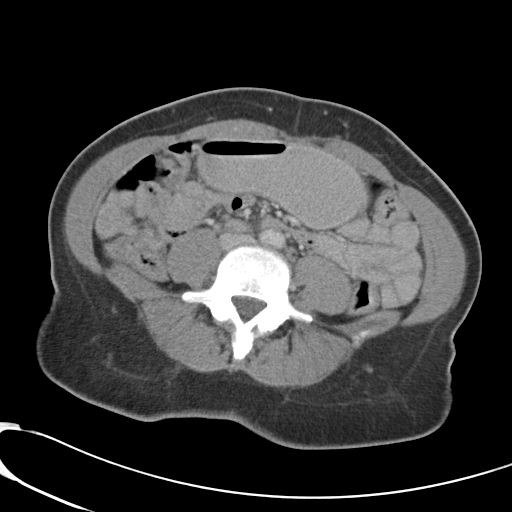
[im 49/83  soft-tissue]
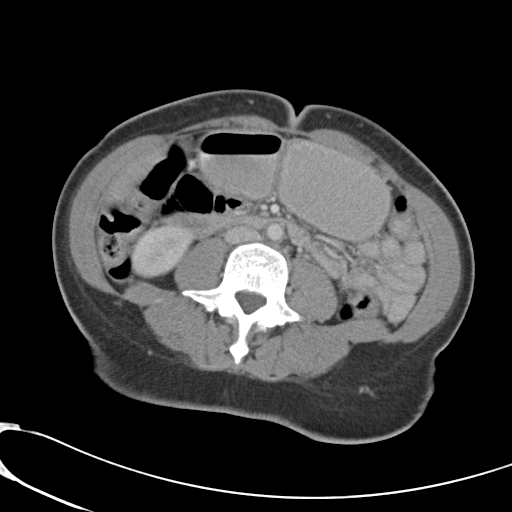
[im 49/83  bone]
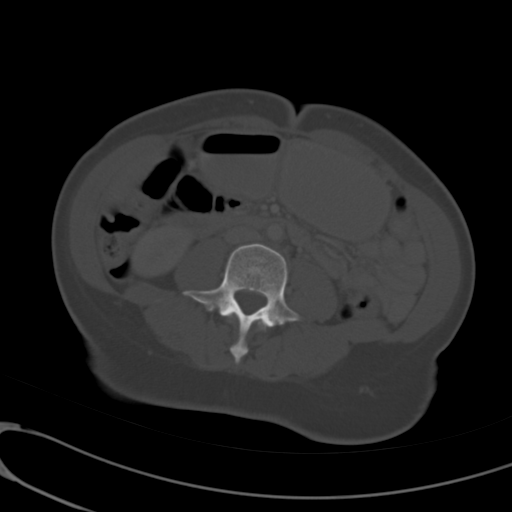
[im 54/83  soft-tissue]
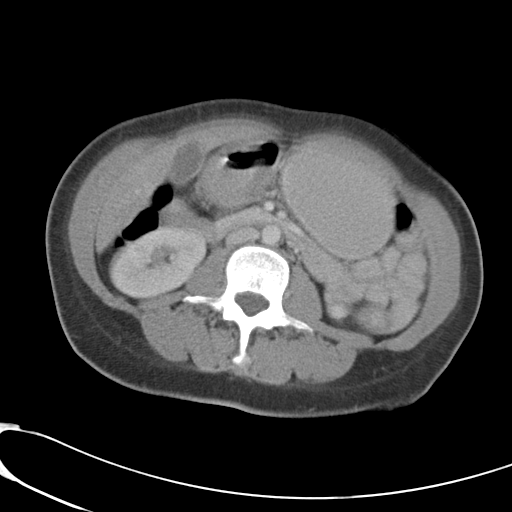
[im 63/83  soft-tissue]
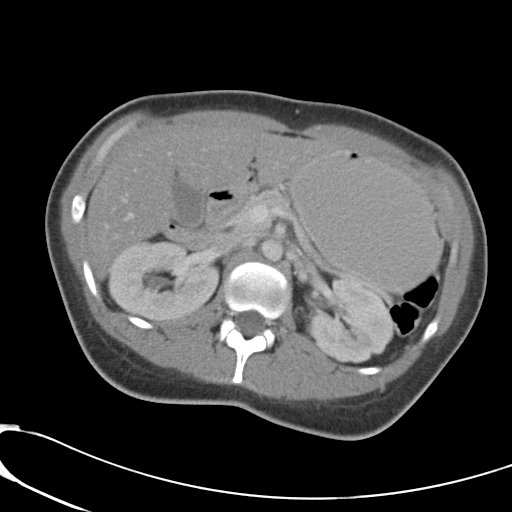
[im 68/83  soft-tissue]
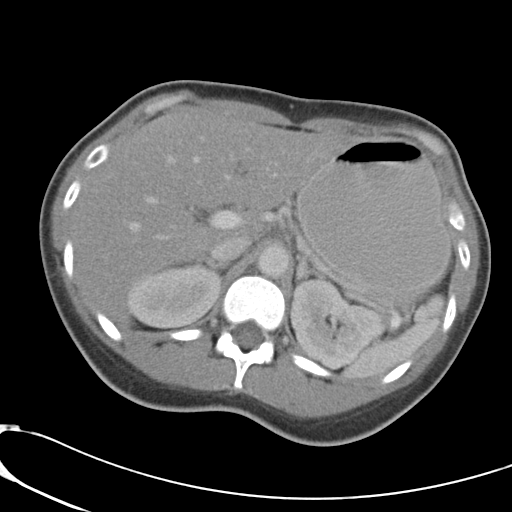
[im 73/83  soft-tissue]
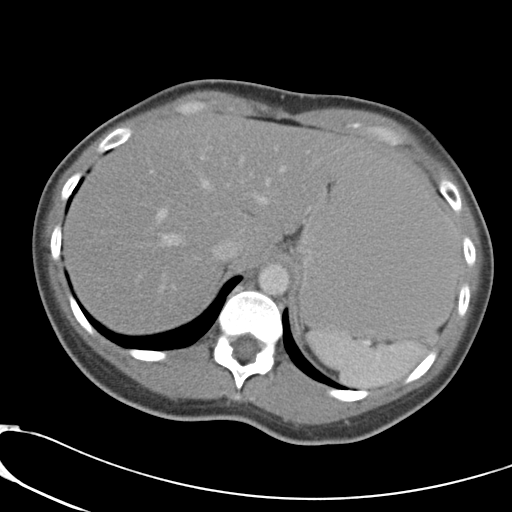
[im 78/83  soft-tissue]
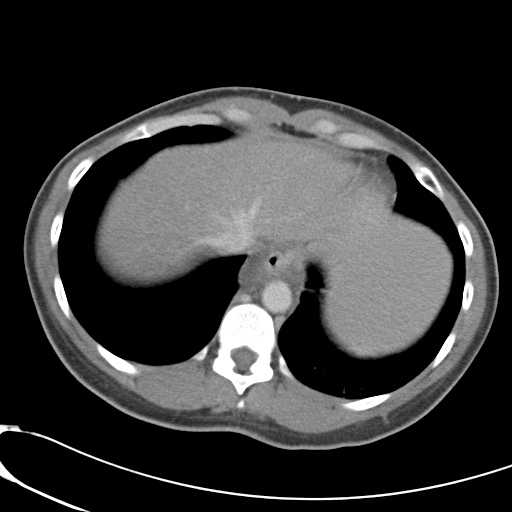

[Series 5: abd/pelv with 2.0 spo st · coronal · 0.81mm/px · 3 of 97 slices shown]
[im 33/97  soft-tissue]
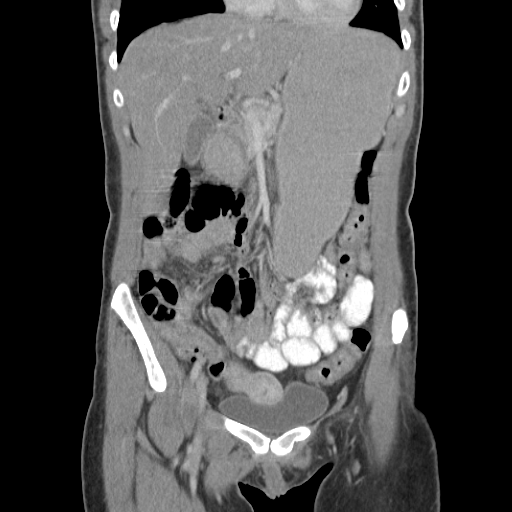
[im 43/97  soft-tissue]
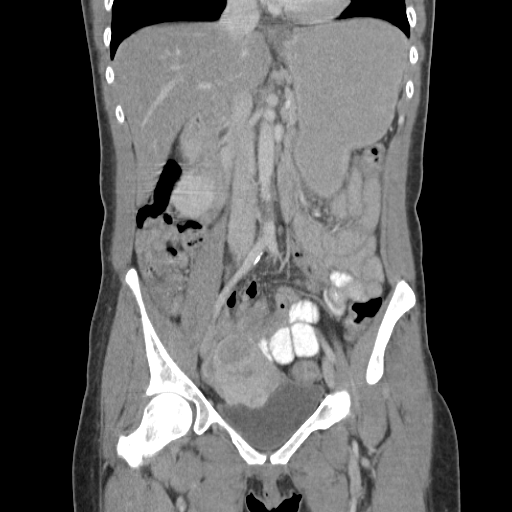
[im 54/97  soft-tissue]
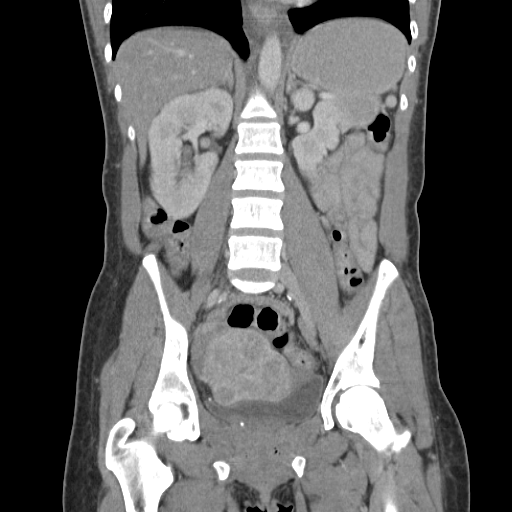

[17 of 46 positions shown; findings below may reference images not displayed]

FINDINGS: Marked thickening and edema of the distal esophageal wall is noted in the limited images of the lower chest.  
 Diffuse fatty infiltration of the liver is present.  The spleen is seen adjacent to small splenules.  There is an irregular contour of the left kidney which is stable compared to the prior CT compatible with scarring.  The pancreas is atrophic.  The gallbladder and adrenal glands are within normal limits.  The stomach is somewhat distended.   Negative free fluid.
IMPRESSION: 1.   Marked thickening of the distal esophageal wall with edema.   Considerations would include both inflammatory and neoplastic etiology.  Esophagram may be helpful. 
 2.  Fatty infiltration of the liver.  
 3.  Stable chronic changes of the left kidney.  
 PELVIS CT WITH CONTRAST:
FINDINGS: The uterus is enlarged containing multiple masses, most consistent with uterine fibroids. Some are calcified.  The appendix is not clearly defined.  The bladder is within normal limits.  Negative free fluid or abnormal adenopathy.
IMPRESSION: Multiple uterine fibroids.

## 2007-01-06 IMAGING — CR DG CHEST 1V PORT
1 series · 1 of 1 positions shown · non-contrast
Comparison: 06/03/06

CLINICAL DATA: 40 year-old with nausea and vomiting, shortness of breath.  
 PORTABLE CHEST- 1 VIEW:

[view not recorded]
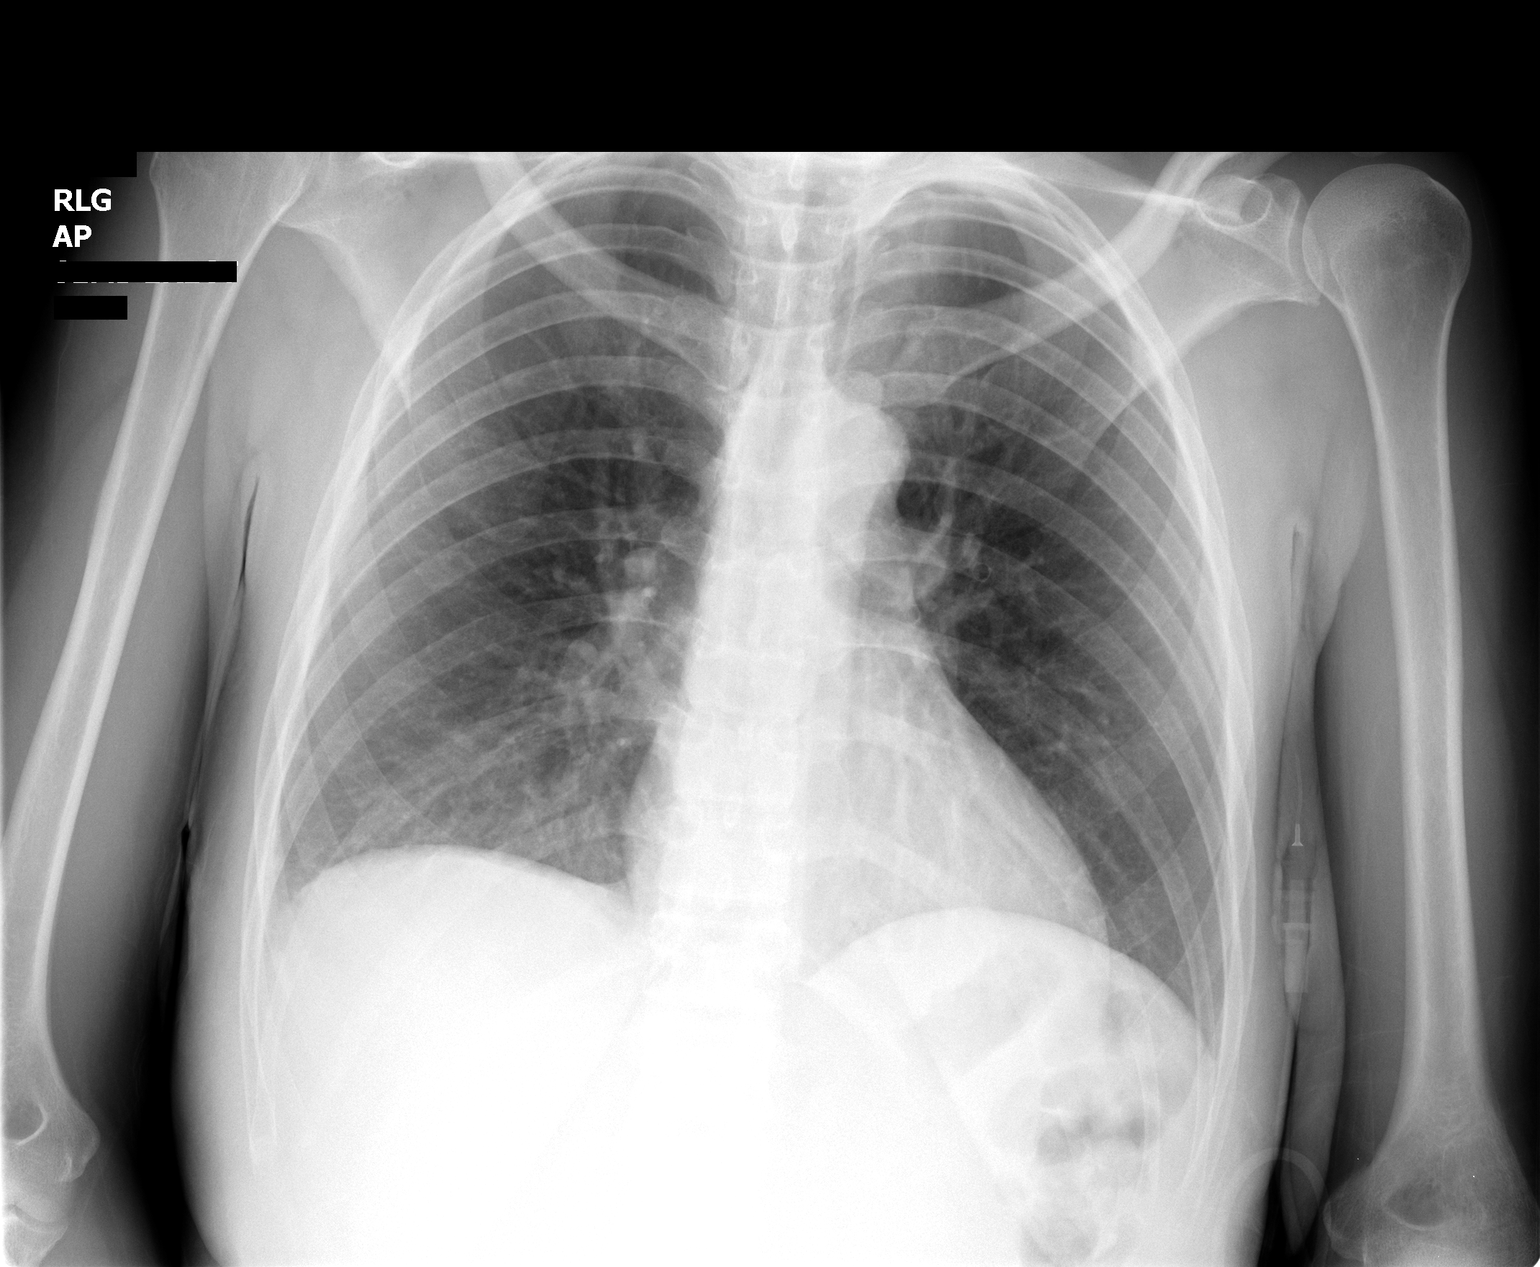

[1 of 1 positions shown; findings below may reference images not displayed]

FINDINGS: Cardiac silhouette, mediastinal and hilar contours are within normal limits and stable.  Lungs are slightly increased.  Streaky density at the right lung base likely reflecting atelectasis.  Could not exclude an early infiltrate.  No pulmonary edema or pleural effusions.
IMPRESSION: 1.  Streaky right basilar density new since prior study.  May reflect atelectasis or an early infiltrate.

## 2007-01-07 IMAGING — US US ABDOMEN COMPLETE
1 series · 13 of 25 positions shown · non-contrast
Comparison: CT scan of 06/05/06.

CLINICAL DATA: Abdominal pain, nausea and vomiting.
ABDOMEN ULTRASOUND:
TECHNIQUE: Complete abdominal ultrasound examination was performed including evaluation of the liver, gallbladder, bile ducts, pancreas, kidneys, spleen, IVC, and abdominal aorta.

[Series 1: unknown · 0.33mm/px · 13 of 55 slices shown]
[im 1/55]
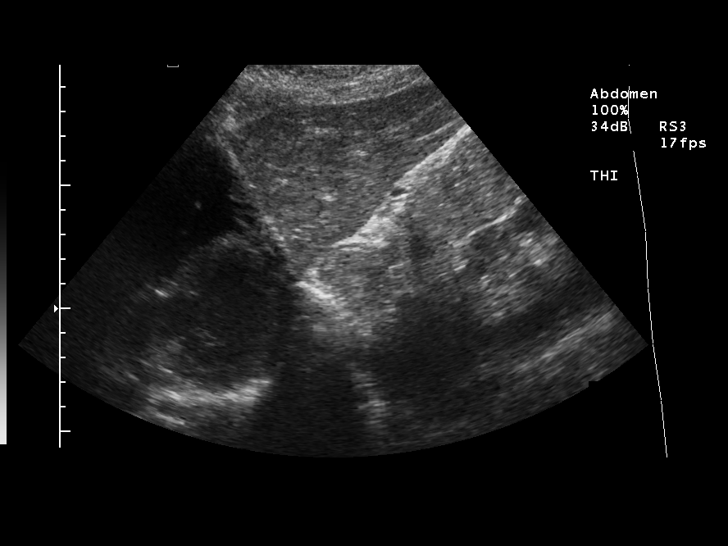
[im 5/55]
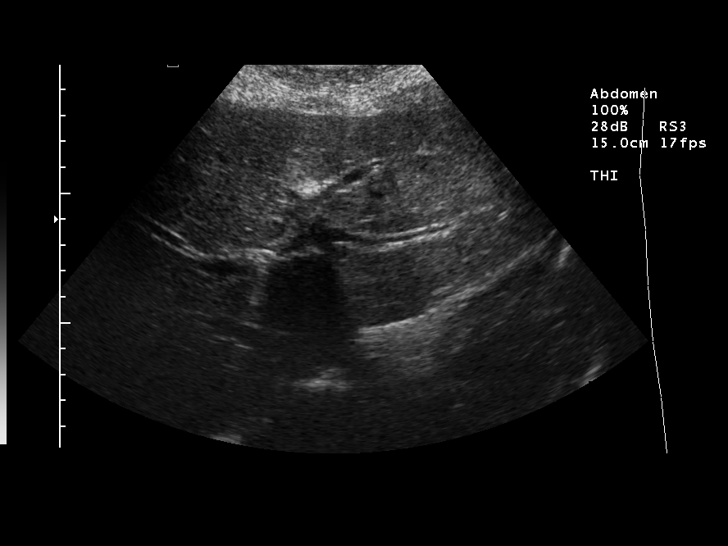
[im 10/55]
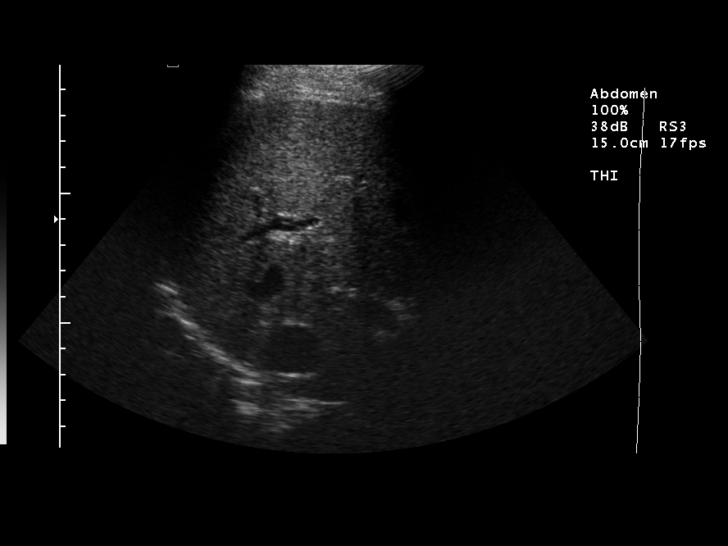
[im 14/55]
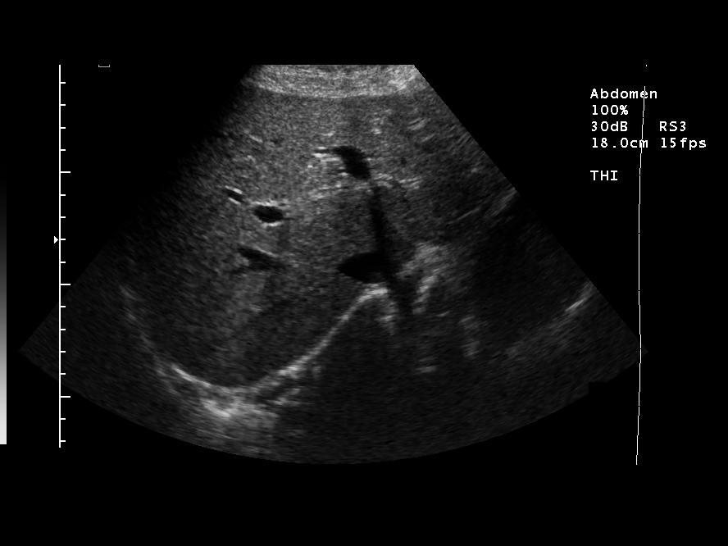
[im 19/55]
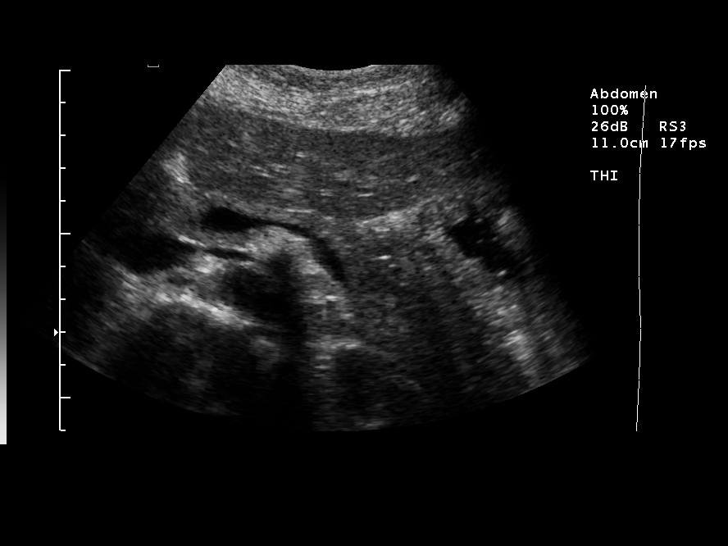
[im 23/55]
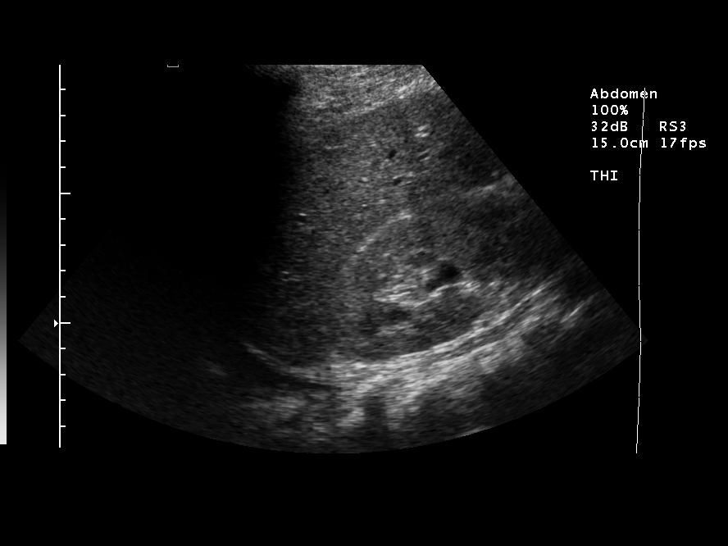
[im 28/55]
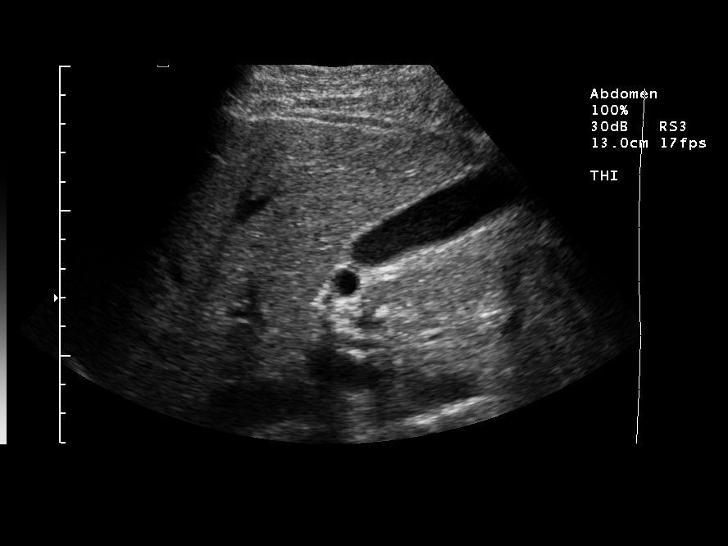
[im 32/55]
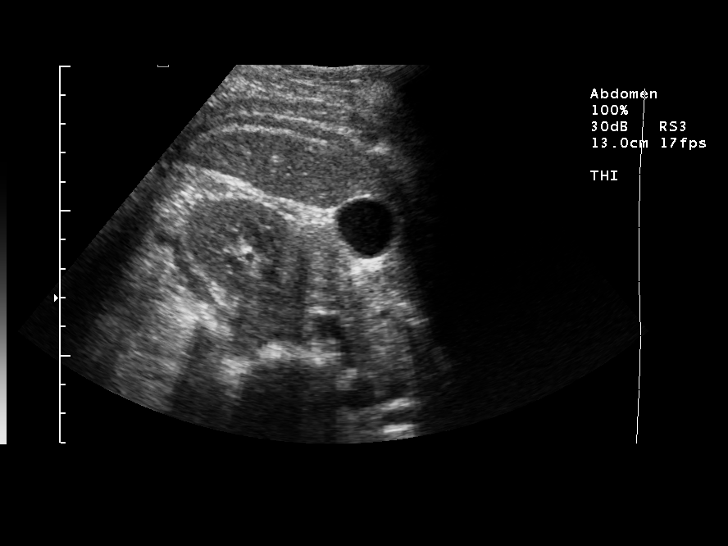
[im 37/55]
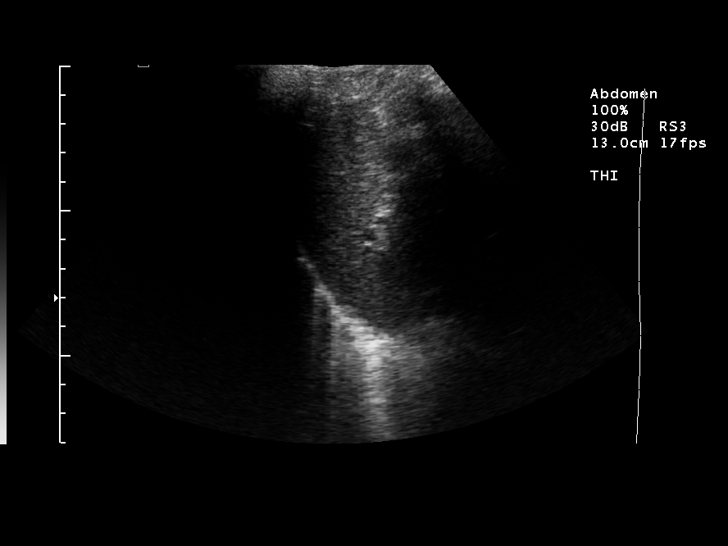
[im 41/55]
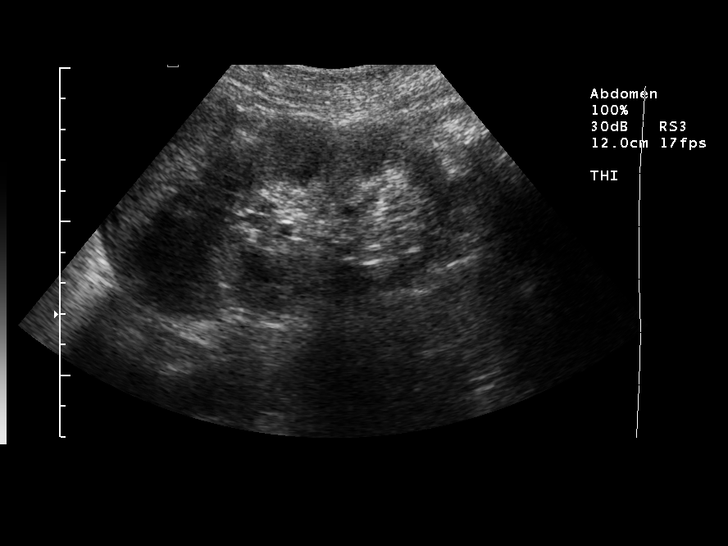
[im 46/55]
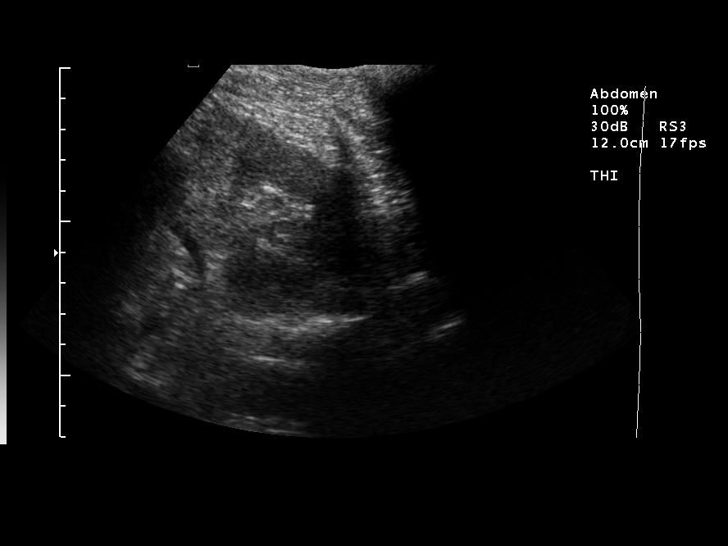
[im 50/55]
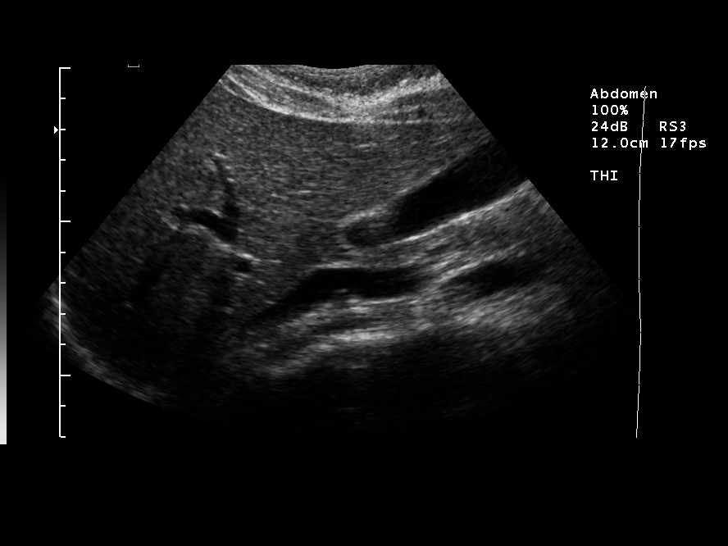
[im 55/55]
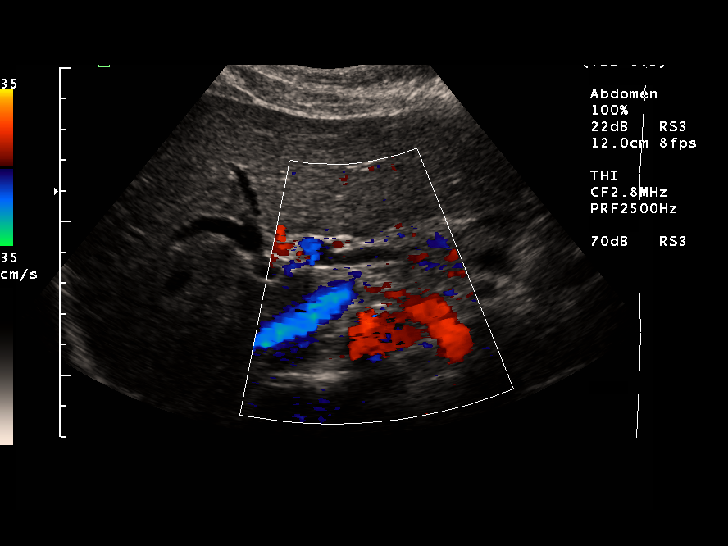

[13 of 25 positions shown; findings below may reference images not displayed]

FINDINGS: The liver is sonographically unremarkable.  No focal hepatic lesions or intrahepatic duct dilatation.  The common bile duct measures a maximum of 6.5 mm and the region of the porta hepatis are normal.  The region of the head of the pancreas appears normal in caliber.  They appeared normal on CT scan also.  The gallbladder demonstrates echogenic sludge.  No gallstones or evidence for acute cholecystitis. 
The pancreas is fairly well visualized and demonstrates no sonographic abnormalities.  The spleen is normal in size and demonstrates no lesions.
The IVC and aorta are normal in caliber.  The right kidney measures 11.7 cm.  The left kidney measures 10.8 cm.  Both kidneys demonstrate normal echogenicity without focal lesions or hydronephrosis.
IMPRESSION: 1. Echogenic gallbladder sludge but no gallstones or evidence for acute cholecystis. 
2. Common bile duct is normal in caliber.
3. Slightly lobulated appearance of the left kidney.  Appears stable since prior CT.  May reflect fetal lobulations.

## 2007-01-08 IMAGING — CR DG CHEST 1V PORT
1 series · 1 of 1 positions shown · non-contrast
Comparison: 06/06/06
 PICC line has been inserted via right upper extremity vein approach.

CLINICAL DATA: PICC line placement.  
 PORTABLE CHEST- 1 VIEW (7777 hours):

[view not recorded]
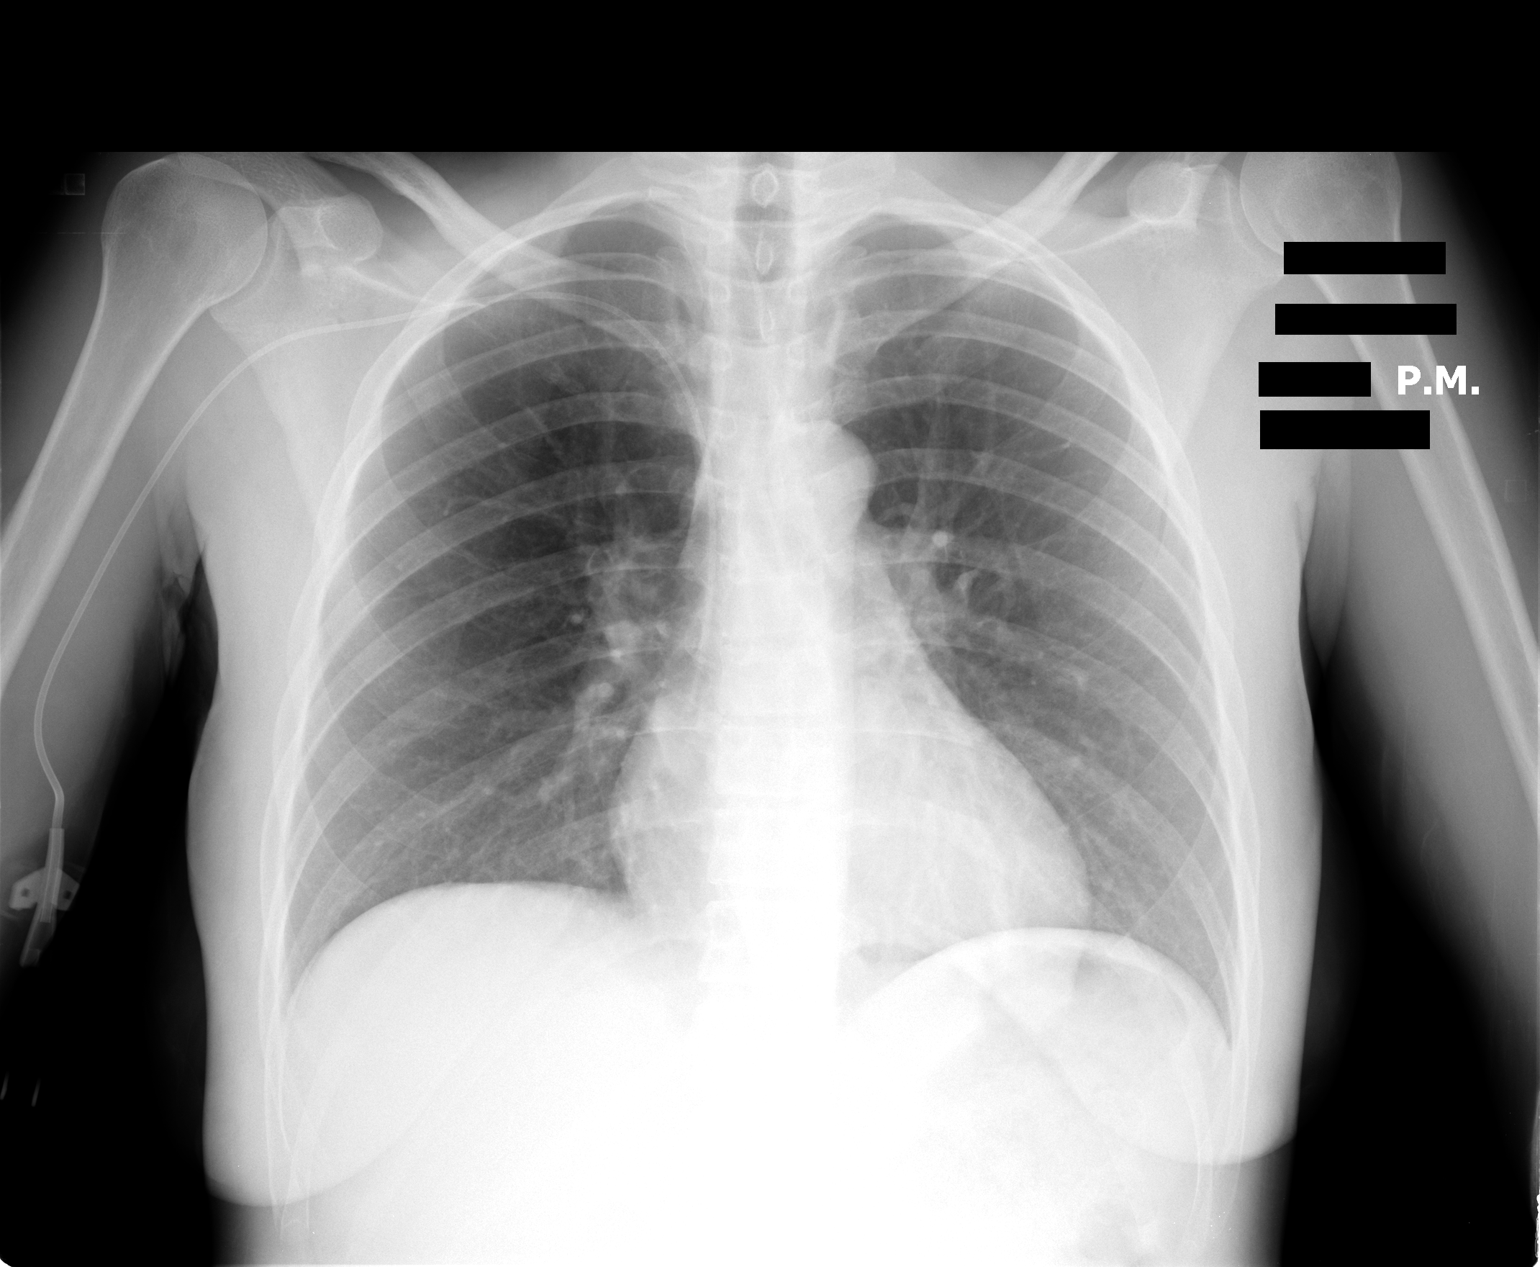

[1 of 1 positions shown; findings below may reference images not displayed]

Tip is very near the SVC/right atrial junction.  No evidence of active cardiopulmonary disease.
IMPRESSION: PICC line tip is at the SVC/right atrial junction region.

## 2007-01-11 IMAGING — CR DG ABDOMEN ACUTE W/ 1V CHEST
3 series · 3 of 3 positions shown · non-contrast
Comparison: Chest x-ray, 06/08/06.

CLINICAL DATA: Cough.  Chest pain.  Shortness of breath.  Abdominal pain and fever. 
 ACUTE ABDOMINAL SERIES:

[w chest pa]
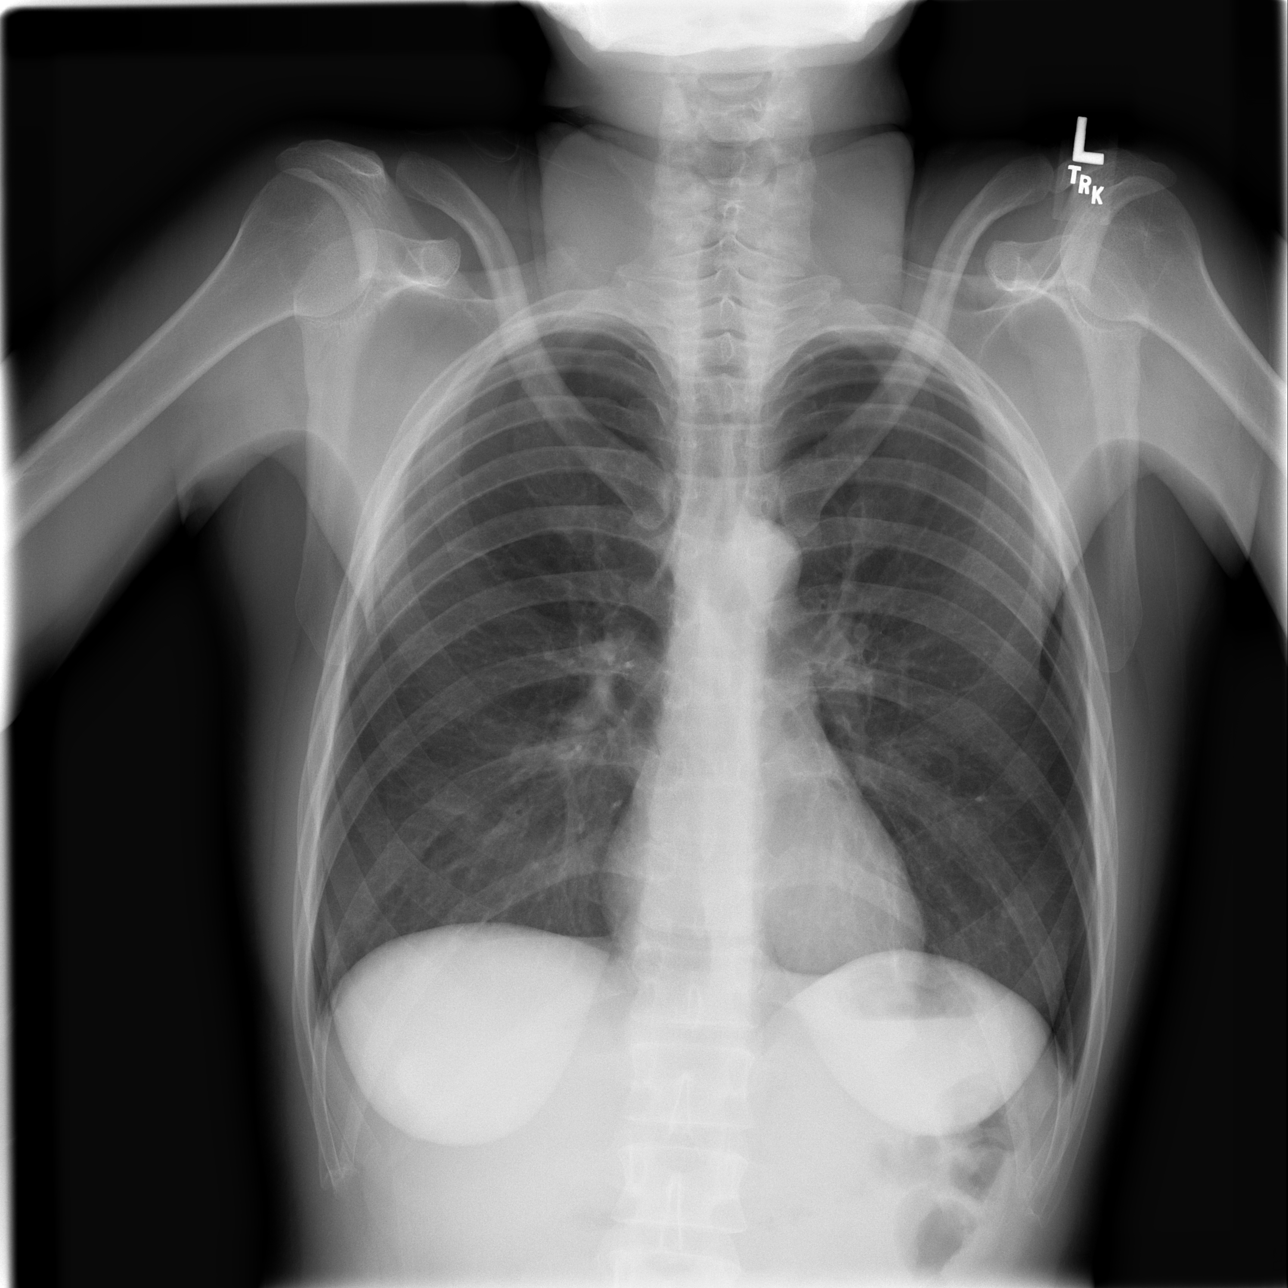

[w abdomen upright]
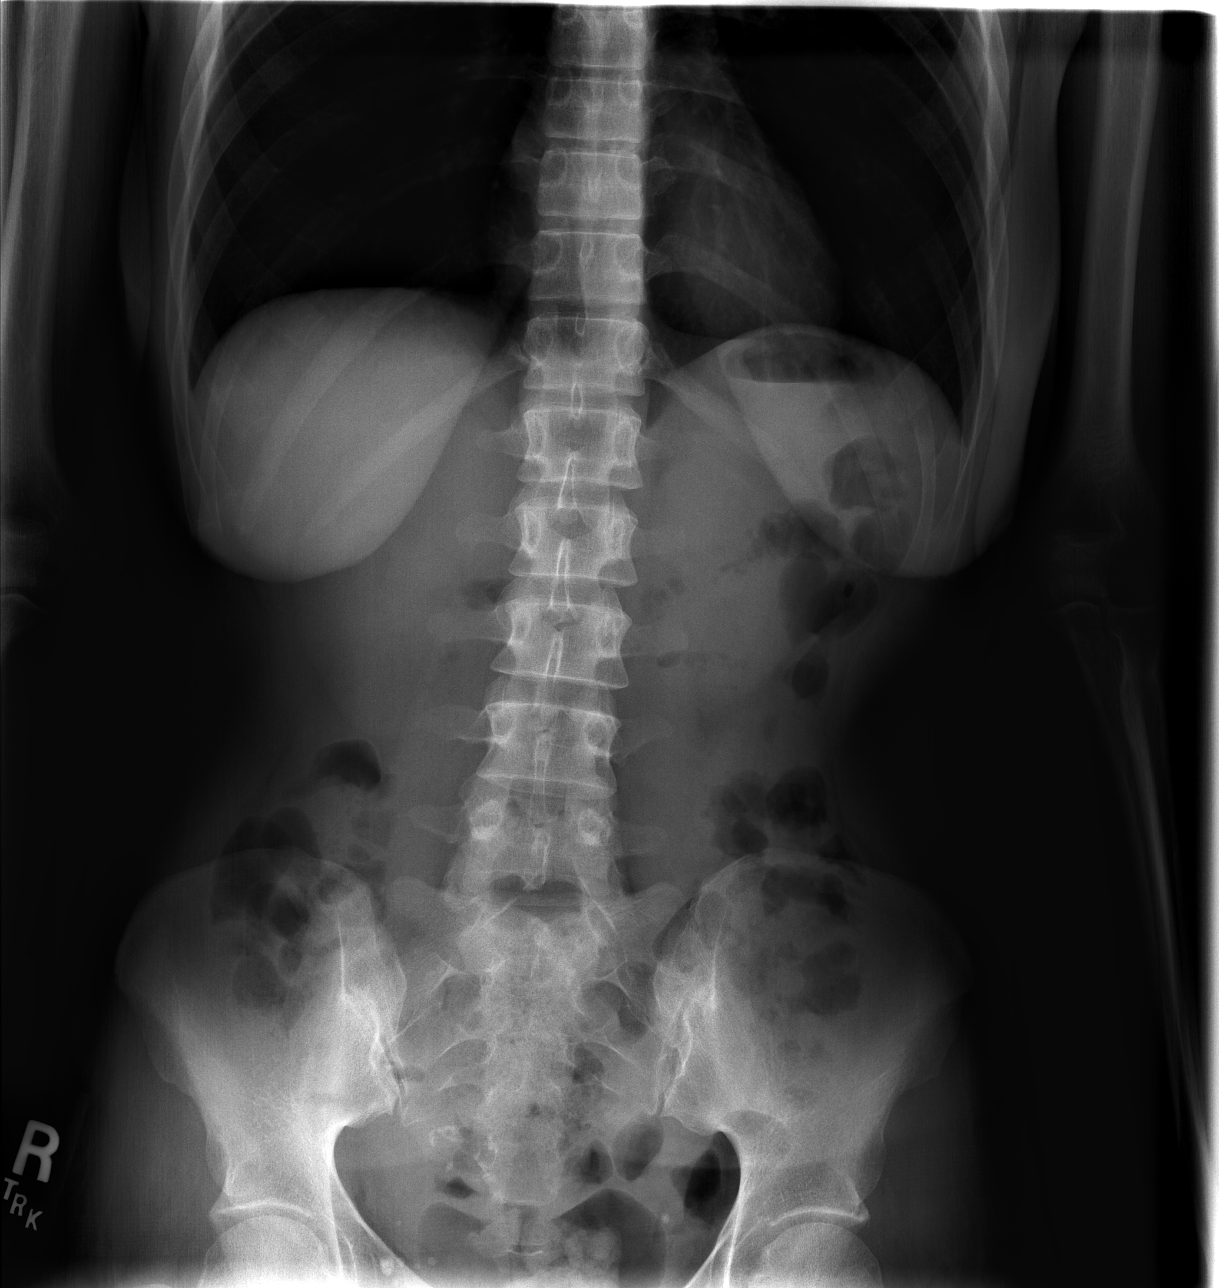

[t abdomen supine]
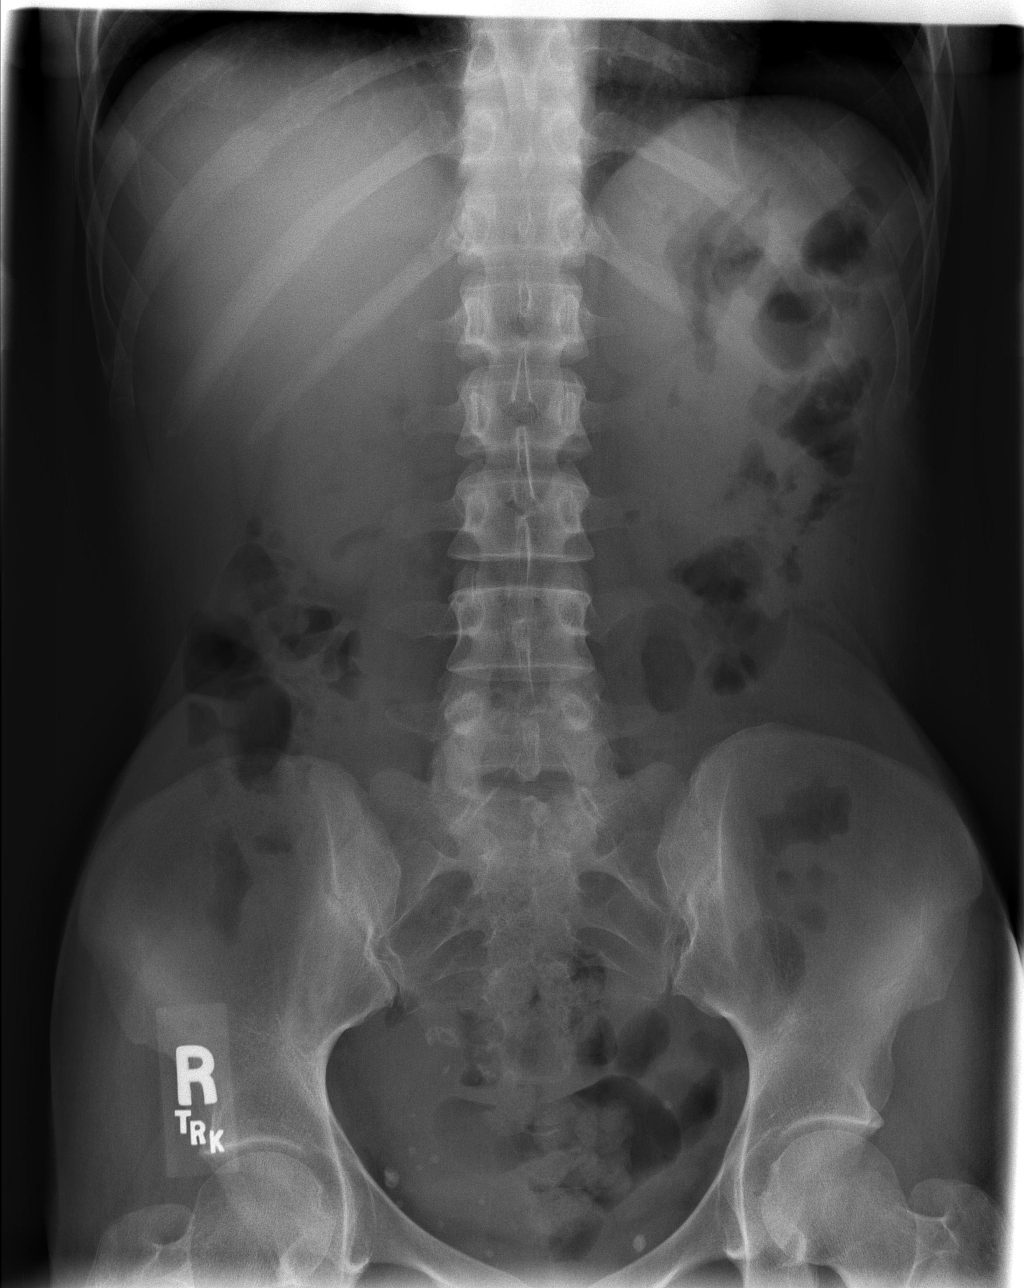

[3 of 3 positions shown; findings below may reference images not displayed]

FINDINGS: An upright view of the chest as well as supine and upright views of the abdomen were performed.  Previously noted PICC line has been removed.  Lungs are clear.  The heart size is normal. 
 Abdominal films show no evidence of bowel obstruction or free intraperitoneal air.  No abnormal calcifications.  Visualized bony structures are unremarkable.
IMPRESSION: No acute findings.

## 2007-01-12 IMAGING — CT CT ABDOMEN W/ CM
2 of 5 series · 13 of 32 positions shown, 18 images · IV contrast (100 ML OMNI 300)
Comparison: 06/05/06.

CLINICAL DATA: 40-year-old with abdominal pain.
 ABDOMEN CT WITH CONTRAST:
TECHNIQUE: Multidetector CT imaging of the abdomen was performed following the standard protocol during bolus administration of intravenous contrast.
 Contrast:  100 cc Omnipaque 300.
TECHNIQUE: Multidetector CT imaging of the pelvis was performed following the standard protocol during bolus administration of intravenous contrast.

[Series 2: routine abdomen · axial · 0.61mm/px · z∈[-377,-97]mm · 5 of 84 slices shown, 10 images]
[im 14/84  soft-tissue]
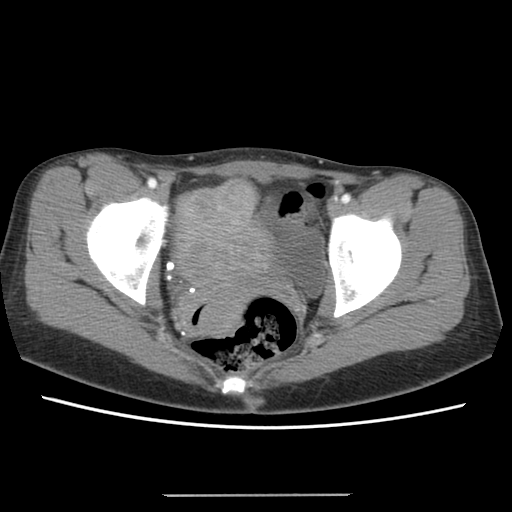
[im 14/84  bone]
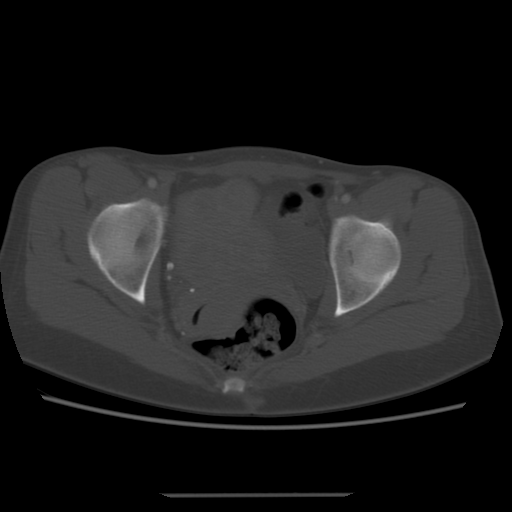
[im 28/84  soft-tissue]
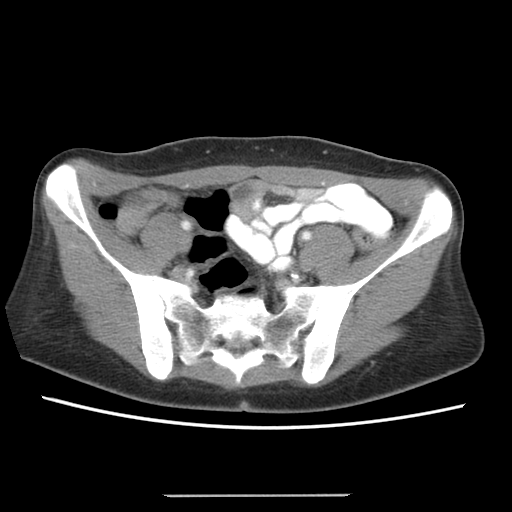
[im 28/84  lung]
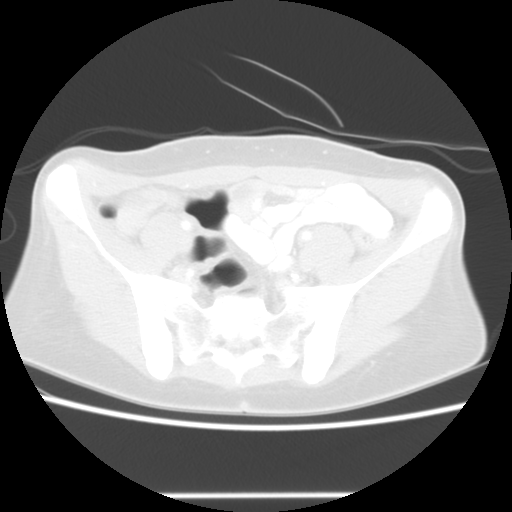
[im 42/84  soft-tissue]
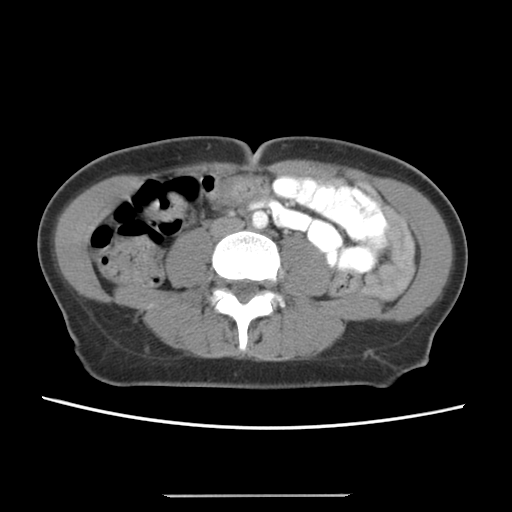
[im 42/84  lung]
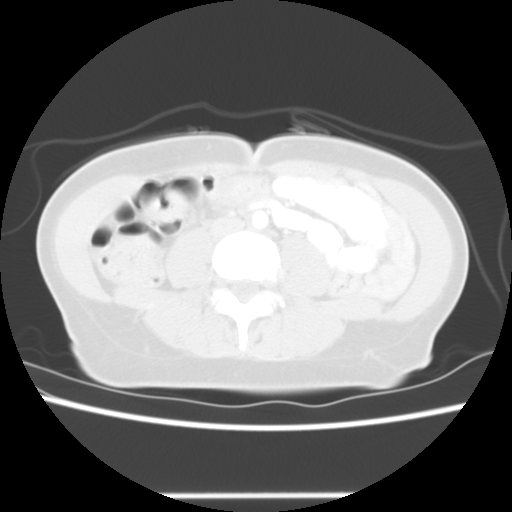
[im 56/84  soft-tissue]
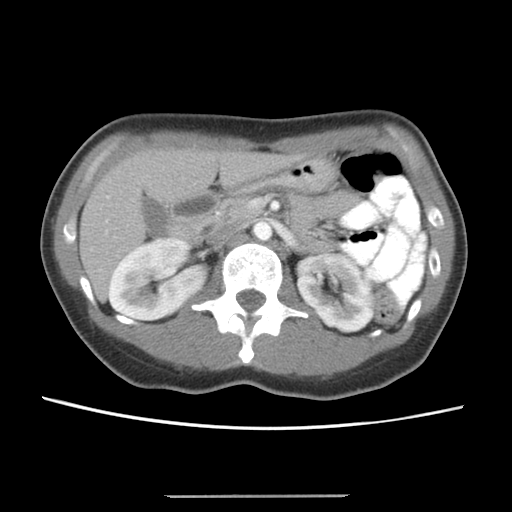
[im 56/84  lung]
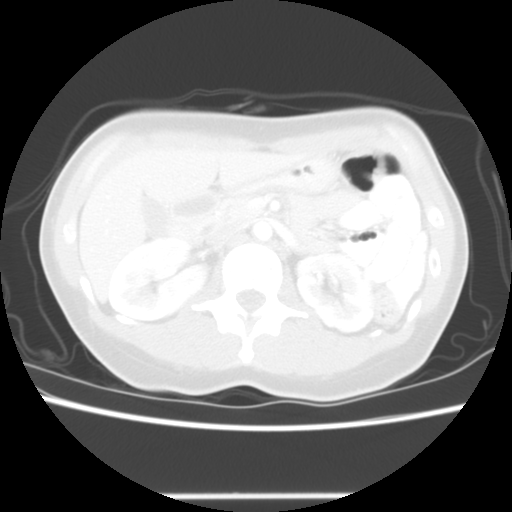
[im 70/84  soft-tissue]
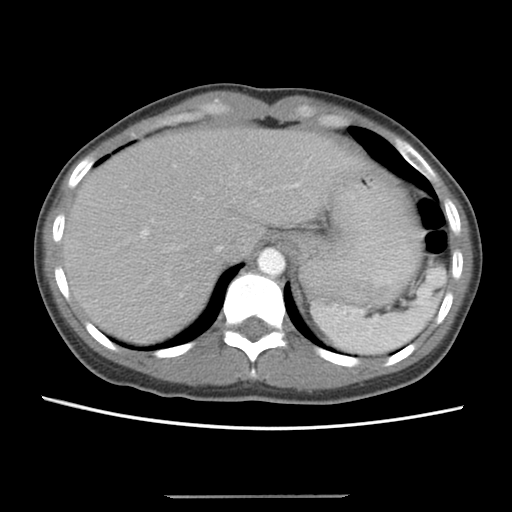
[im 70/84  lung]
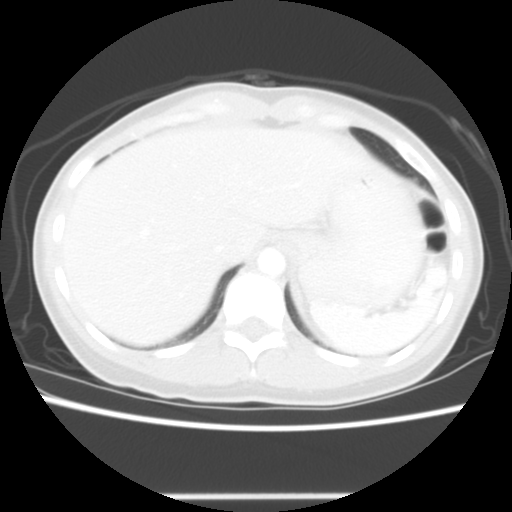

[Series 400: reformatted · sagittal · 0.88mm/px · 8 of 136 slices shown]
[im 14/136  soft-tissue]
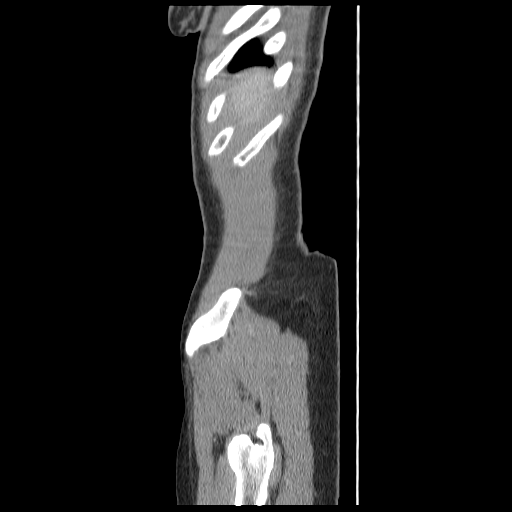
[im 28/136  soft-tissue]
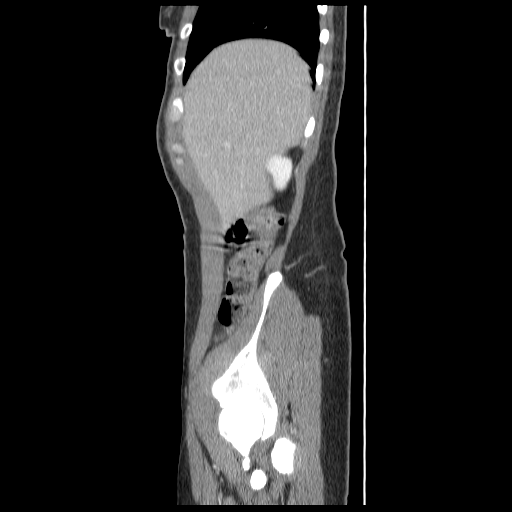
[im 41/136  soft-tissue]
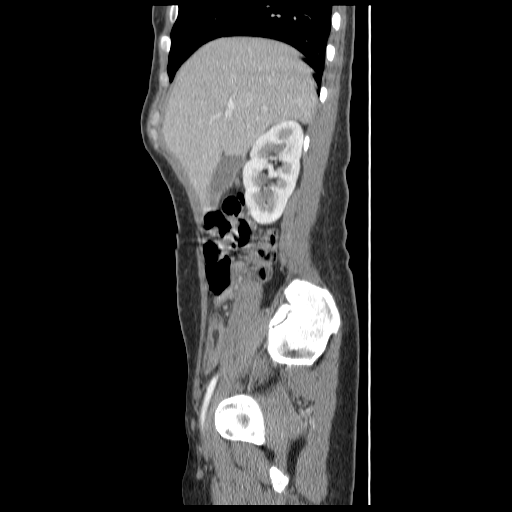
[im 55/136  soft-tissue]
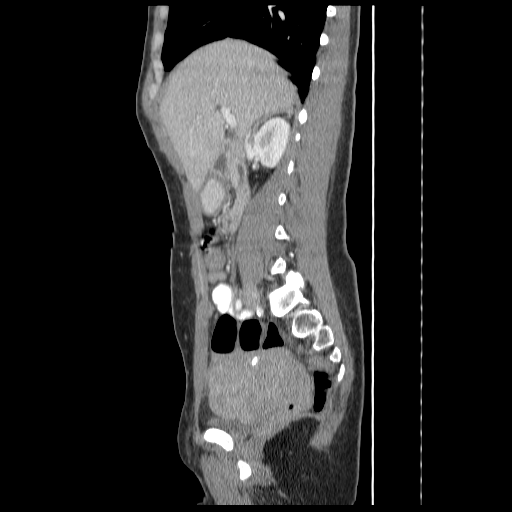
[im 82/136  soft-tissue]
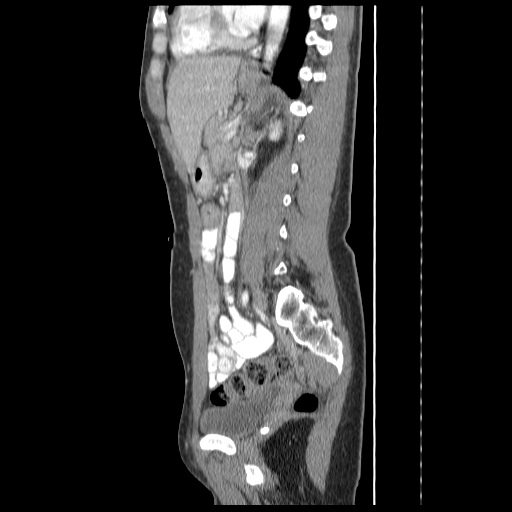
[im 95/136  soft-tissue]
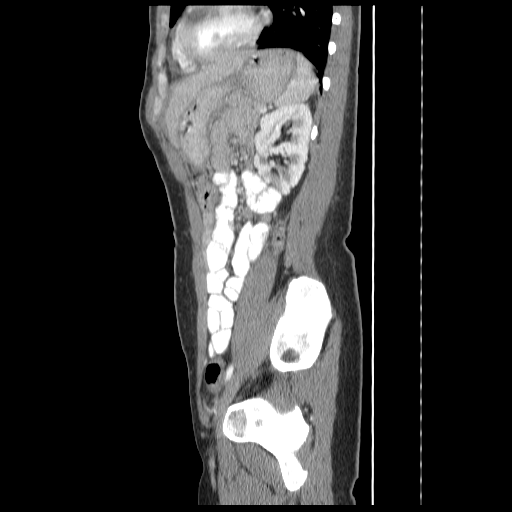
[im 109/136  soft-tissue]
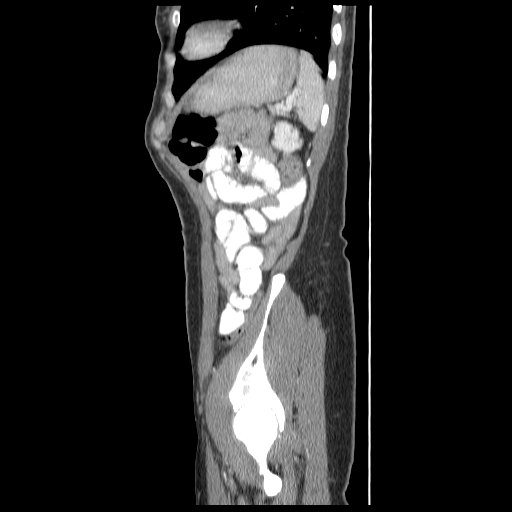
[im 122/136  soft-tissue]
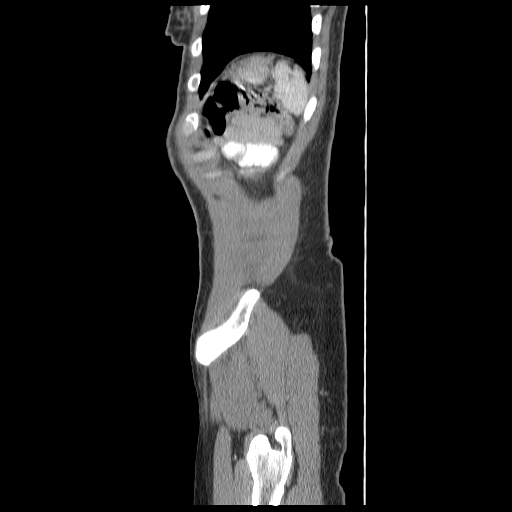

[13 of 32 positions shown; findings below may reference images not displayed]

FINDINGS: The lung bases are clear except for minimal dependent atelectasis.  Distal esophageal wall thickening is again noted and may be secondary to reflux esophagitis.  Recommend clinical correlation. 
 The liver and spleen are unremarkable and stable.  Small accessory spleens are noted. The pancreas demonstrates no significant findings. The adrenal glands and kidneys are stable with marked fetal lobulations of the left kidney.  The gallbladder appears normal. The stomach is fairly well distended with contrast and no gross abnormalities are seen. There is mild contraction in the region of the antral pylorus. 
 The aorta is normal in caliber. The major branch vessels are normal. The portal and splenic veins are patent. No enlarged mesenteric or retroperitoneal nodes.
IMPRESSION: Unremarkable and stable CT appearance of the abdomen.  There is persistent distal esophageal wall thickening, which may represent esophagitis. 
 PELVIS CT WITH CONTRAST:
FINDINGS: Enlarged fibroid uterus is again demonstrated. Both ovaries appear normal.  There is a small amount of free pelvic fluid. The rectum, sigmoid colon, and visualized small bowel loops are grossly normal. The appendix is not visualized for certain, but I do not see any secondary findings to suggest appendicitis. Mild narrowing of the terminal ileum may be due to contraction.
IMPRESSION: 1.  Enlarged fibroid uterus. 
 2.  Small amount of free pelvic fluid.
 3.  Normal appearance of the ovaries.
 4.  Appendix appears normal. There may be some contrast in it from the recent CT scan, but there is no evidence for appendicitis.

## 2007-01-15 IMAGING — CR DG CHEST 1V PORT
1 series · 1 of 1 positions shown · non-contrast
Comparison: 06/08/06.

CLINICAL DATA: Nausea/vomiting, PICC line placement. 
 PORTABLE CHEST - 1 VIEW ? 06/15/06:

[view not recorded]
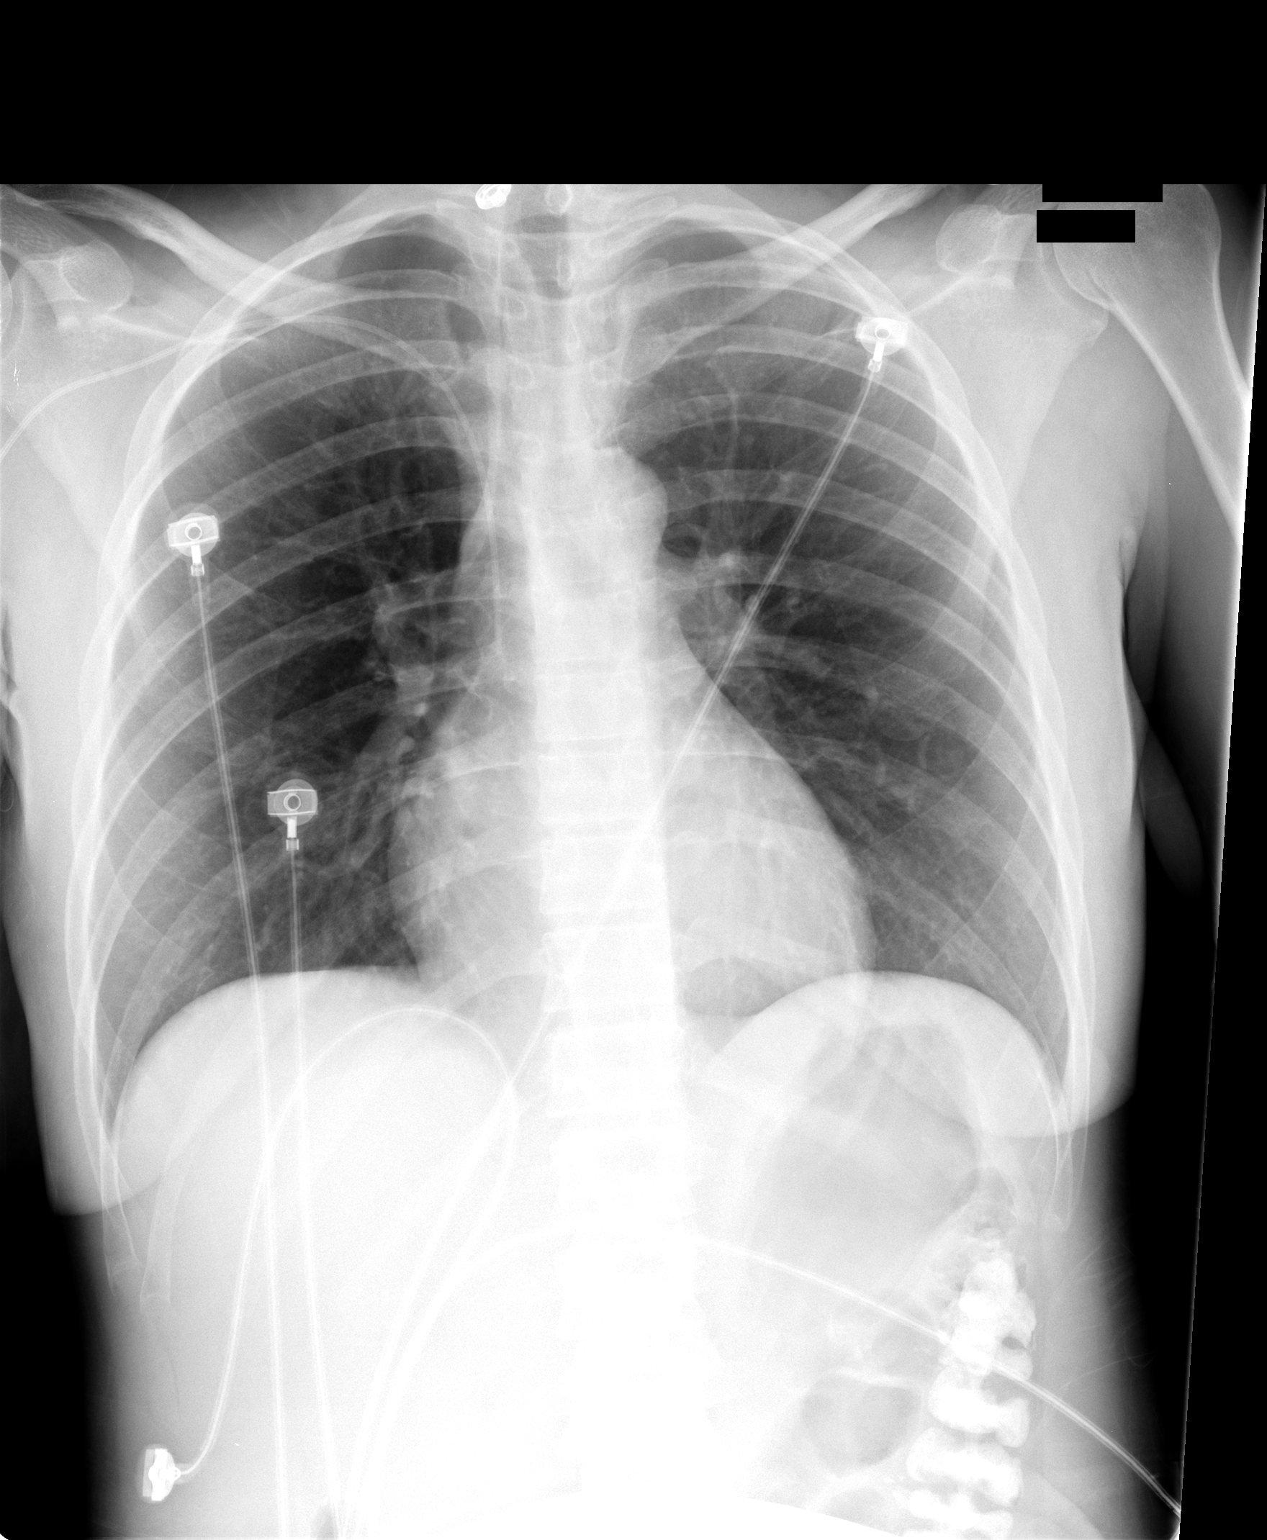

[1 of 1 positions shown; findings below may reference images not displayed]

FINDINGS: The patient has a right-sided PICC line with the tip in the superior vena cava.  No pneumothorax.  The lungs are clear.     No effusion.  Heart size normal.   Note is made of contrast material noted in the left colon.
IMPRESSION: PICC line in place without complicating features.  No acute disease.

## 2007-01-19 IMAGING — NM NM HEPATO W/GB/PHARM/[PERSON_NAME]
2 series · 12 of 12 positions shown · non-contrast
Comparison: none

CLINICAL DATA: Abdominal pain, nausea and vomiting.
NUCLEAR MEDICINE HEPATOBILIARY SCAN WITH EJECTION FRACTION:
TECHNIQUE: Sequential abdominal images were obtained following intravenous injection of radiopharmaceutical.  Sequential images were continued during slow intravenous infusion of sincalide, and the gallbladder ejection fraction was calculated.
Radiopharmaceutical:  5 mCi Xc-ZZm Choletec.

[Series 1: he hepatobiliary · 3.21mm/px · 6 of 60 frames shown (1 of 2)]
[frame 6/60]
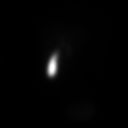
[frame 16/60]
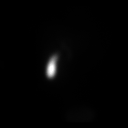
[frame 26/60]
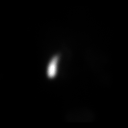
[frame 36/60]
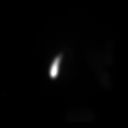
[frame 46/60]
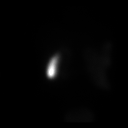
[frame 56/60]
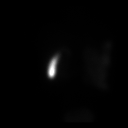

[Series 1: he hepatobiliary · 3.21mm/px · 6 of 54 frames shown (2 of 2)]
[frame 5/54]
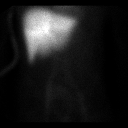
[frame 14/54]
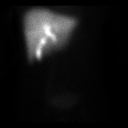
[frame 23/54]
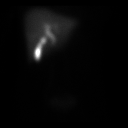
[frame 32/54]
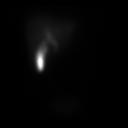
[frame 41/54]
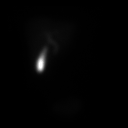
[frame 50/54]
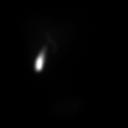

[12 of 12 positions shown; findings below may reference images not displayed]

FINDINGS: Normal tracer uptake by the liver.  Biliary ductal and gallbladder activity are noted at 13 to 14 minutes.  Small bowel activity is then appreciated.  Gallbladder ejection fraction is 24% at 1 hour.  Normally at 1 hour the gallbladder ejection fraction is greater than or equal to 50%.
IMPRESSION: Patency of the biliary and cystic ducts.  Gallbladder ejection fraction at 1 hour is low, 24%.

## 2007-02-02 ENCOUNTER — Ambulatory Visit: Payer: Self-pay | Admitting: Internal Medicine

## 2007-02-02 ENCOUNTER — Inpatient Hospital Stay (HOSPITAL_COMMUNITY): Admission: EM | Admit: 2007-02-02 | Discharge: 2007-02-05 | Payer: Self-pay | Admitting: Emergency Medicine

## 2007-02-04 ENCOUNTER — Encounter (INDEPENDENT_AMBULATORY_CARE_PROVIDER_SITE_OTHER): Payer: Self-pay | Admitting: Internal Medicine

## 2007-02-04 IMAGING — CR DG CHEST 1V PORT
1 series · 1 of 1 positions shown · non-contrast
Comparison: 06/15/06.

CLINICAL DATA: PICC line placement.
 PORTABLE CHEST - 1 VIEW - 07/05/06 AT 9830 HOURS:

[view not recorded]
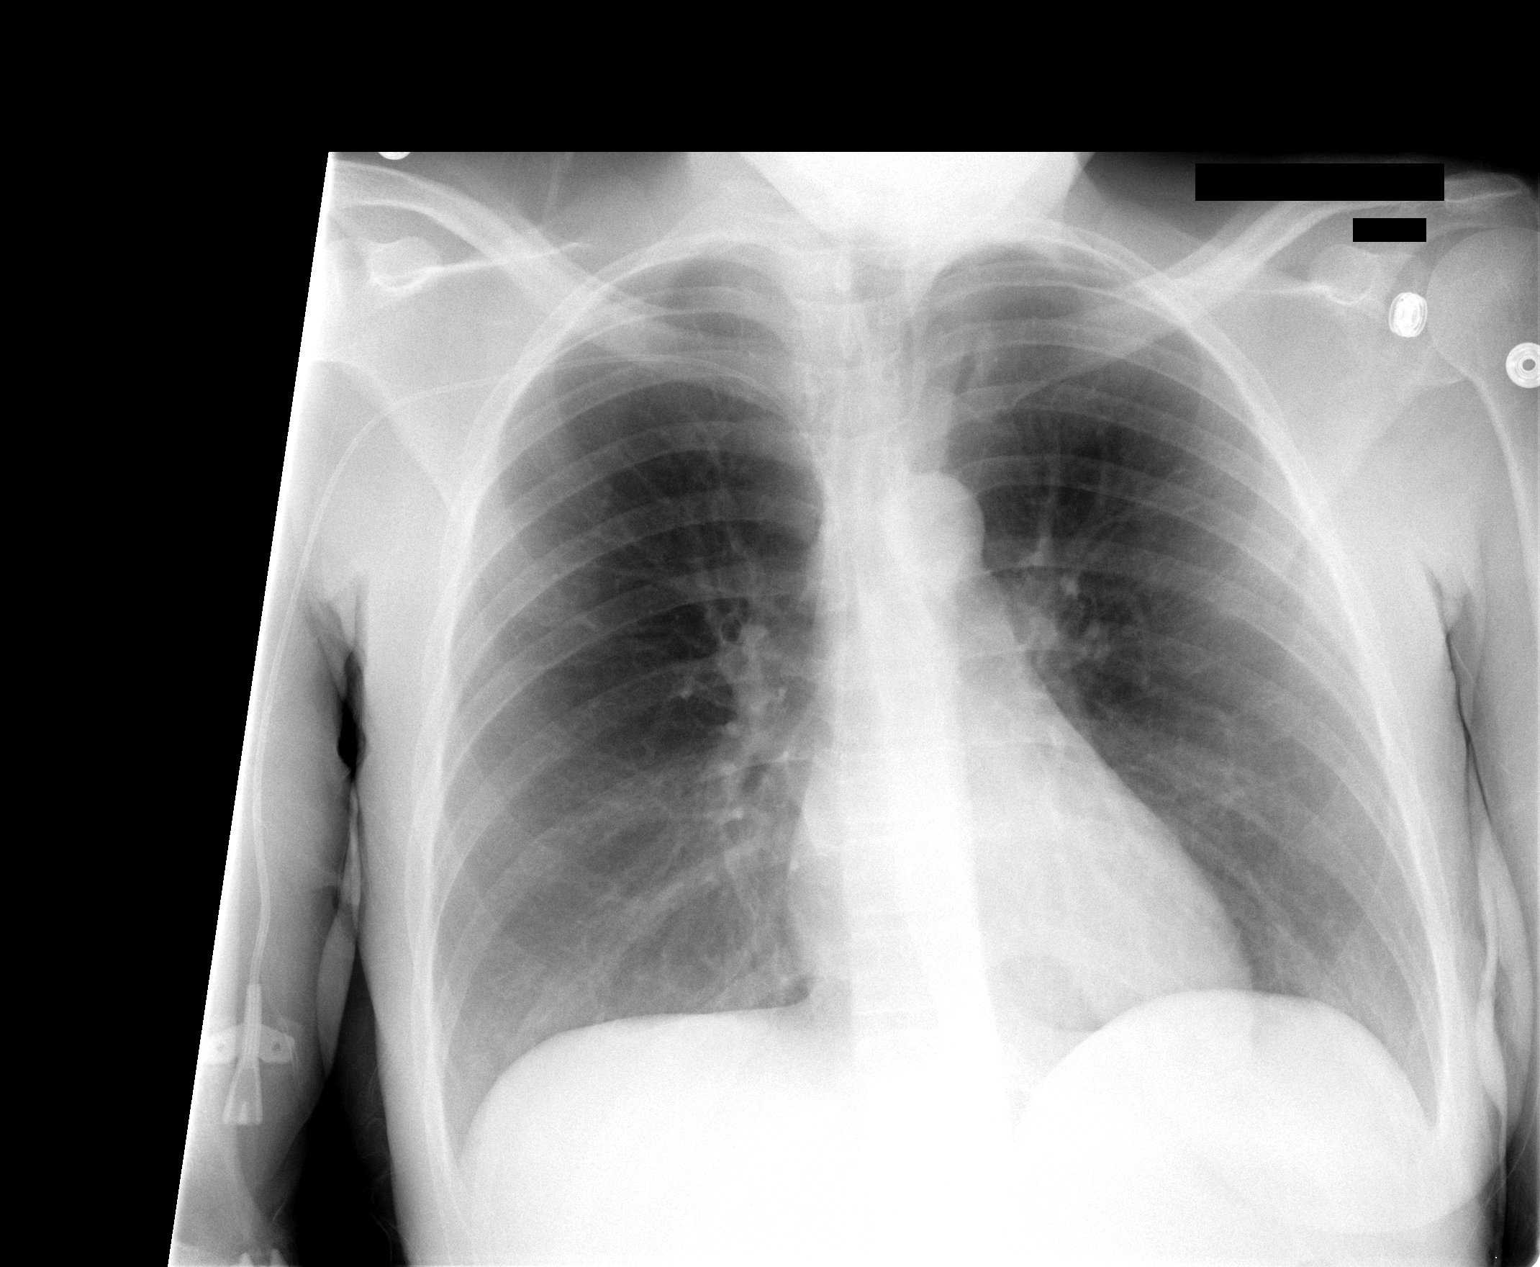

[1 of 1 positions shown; findings below may reference images not displayed]

FINDINGS: The lungs are clear.   A right PICC line is seen with the tip in the low SVC.  No pneumothorax is seen.  The heart is within normal limits in size.
IMPRESSION: PICC tip in low SVC.  No active lung disease.

## 2007-02-05 ENCOUNTER — Encounter (INDEPENDENT_AMBULATORY_CARE_PROVIDER_SITE_OTHER): Payer: Self-pay | Admitting: Internal Medicine

## 2007-02-05 IMAGING — RF DG ESOPHAGUS
14 of 24 series · 14 of 24 positions shown · non-contrast
Comparison: none

CLINICAL DATA: Dysphagia.  Sensation of food sticking in esophagus.  Frequent heartburn.  Twenty pound weight loss in the last month.
BARIUM ESOPHAGRAM ? 07/06/06:
TECHNIQUE: Single and double contrast studies were performed.  The patient was examined in the erect and recumbent positions.  I did not ask the patient to swallow a barium tablet because it was quite obvious that the tablet would become obstructed by a marked distal esophageal stricture noted.

[Series 1: run · 1 of 1 slices shown (1 of 14)]
[im 1/1]
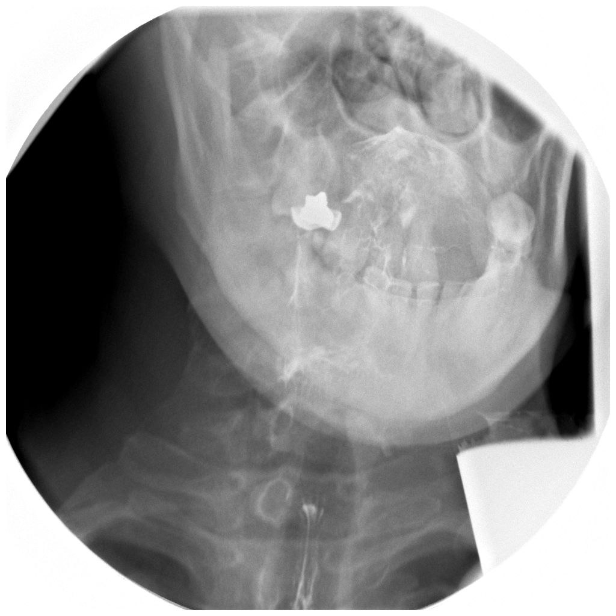

[Series 3: run · 1 of 4 slices shown (2 of 14)]
[im 1/4]
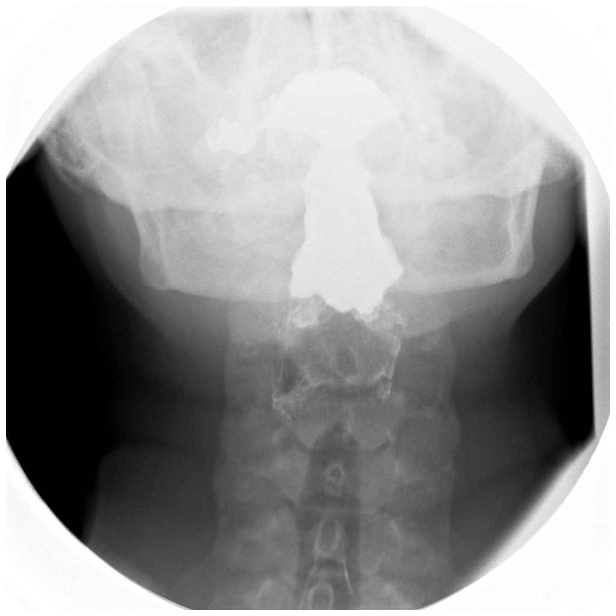

[Series 5: run · 1 of 3 slices shown (3 of 14)]
[im 1/3]
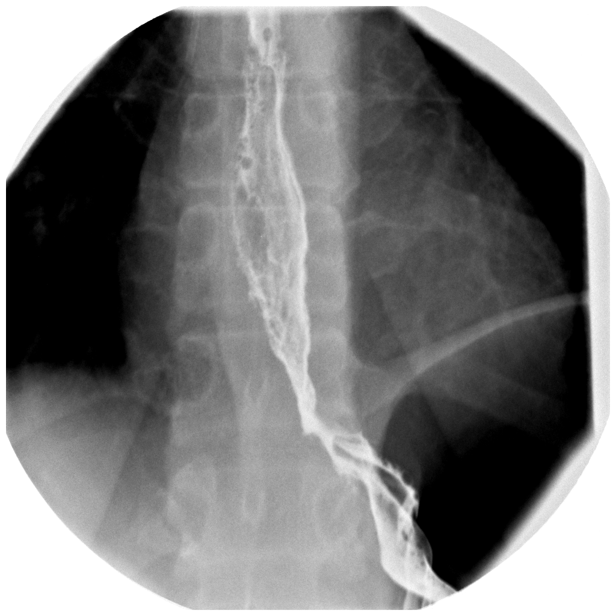

[Series 7: run · 1 of 1 slices shown (4 of 14)]
[im 1/1]
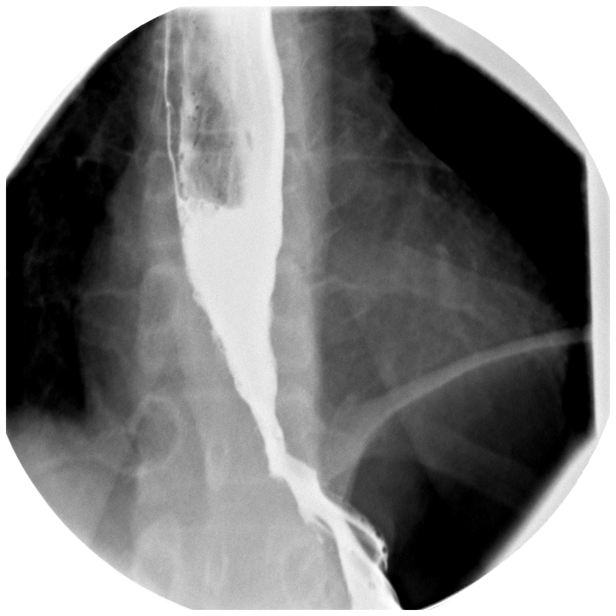

[Series 8: run · 1 of 2 slices shown (5 of 14)]
[im 1/2]
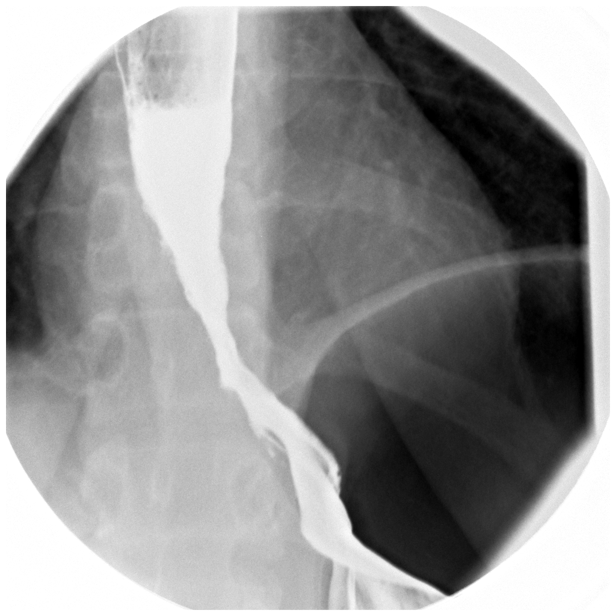

[Series 10: run · 1 of 5 slices shown (6 of 14)]
[im 1/5]
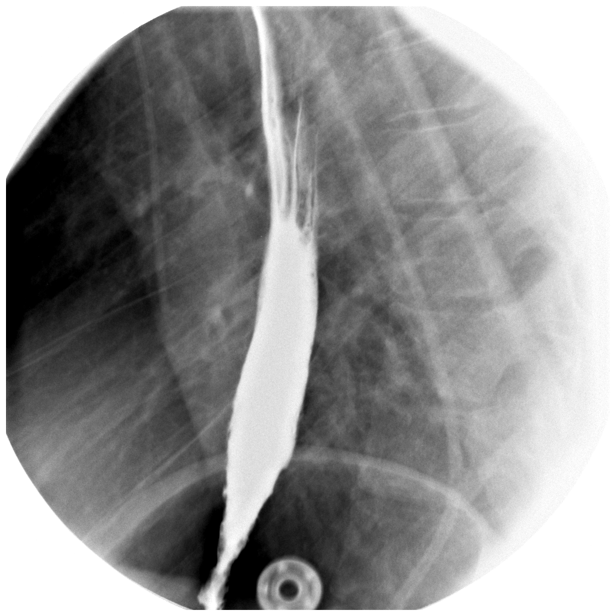

[Series 12: run · 1 of 3 slices shown (7 of 14)]
[im 1/3]
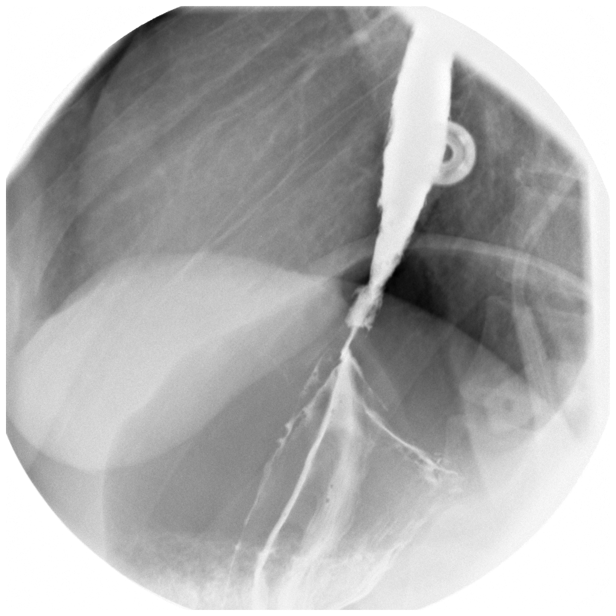

[Series 13: run · 1 of 1 slices shown (8 of 14)]
[im 1/1]
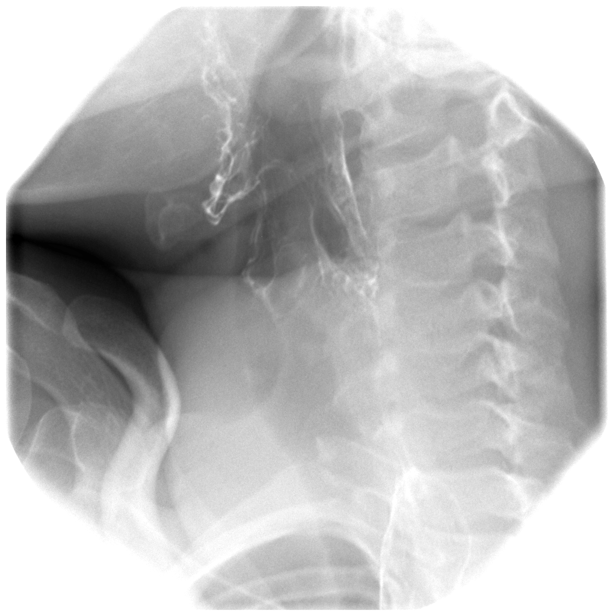

[Series 15: run · 1 of 1 slices shown (9 of 14)]
[im 1/1]
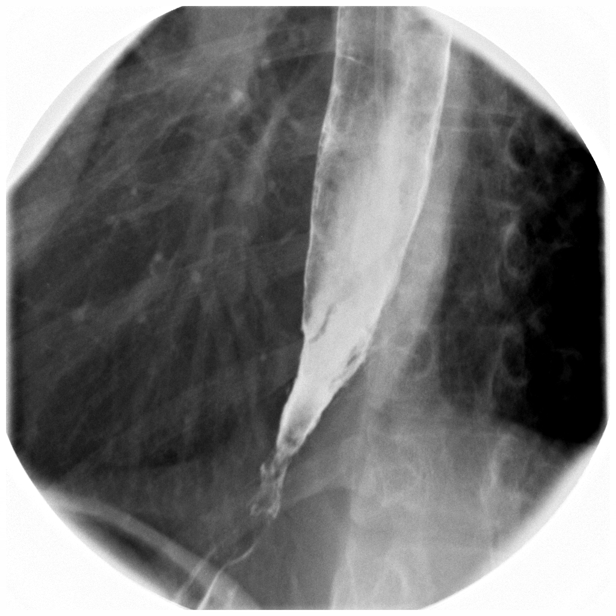

[Series 17: run · 1 of 1 slices shown (10 of 14)]
[im 1/1]
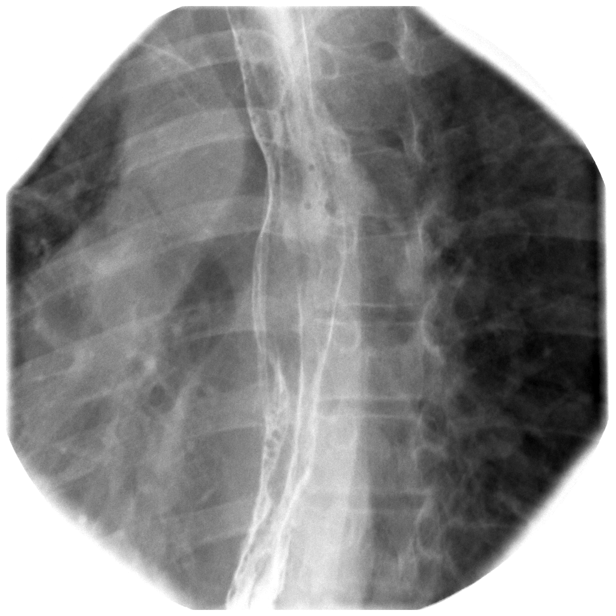

[Series 19: run · 1 of 1 slices shown (11 of 14)]
[im 1/1]
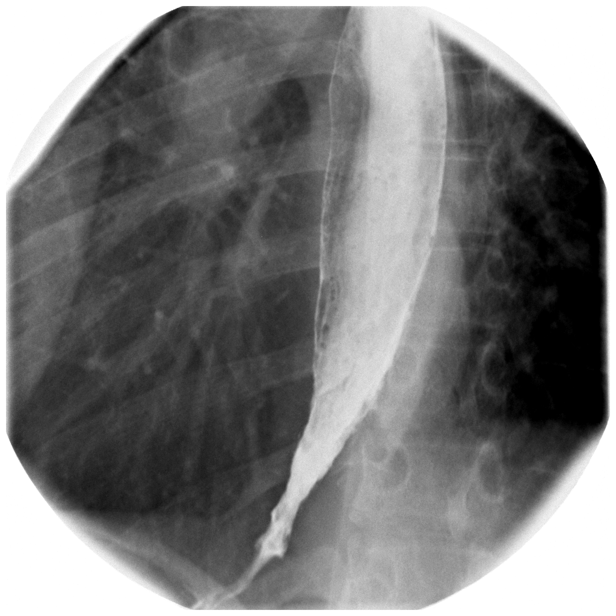

[Series 20: run · 1 of 1 slices shown (12 of 14)]
[im 1/1]
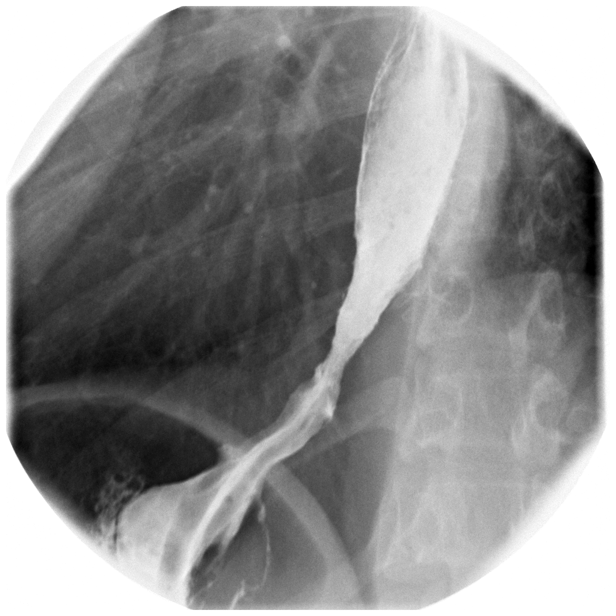

[Series 22: run · 1 of 1 slices shown (13 of 14)]
[im 1/1]
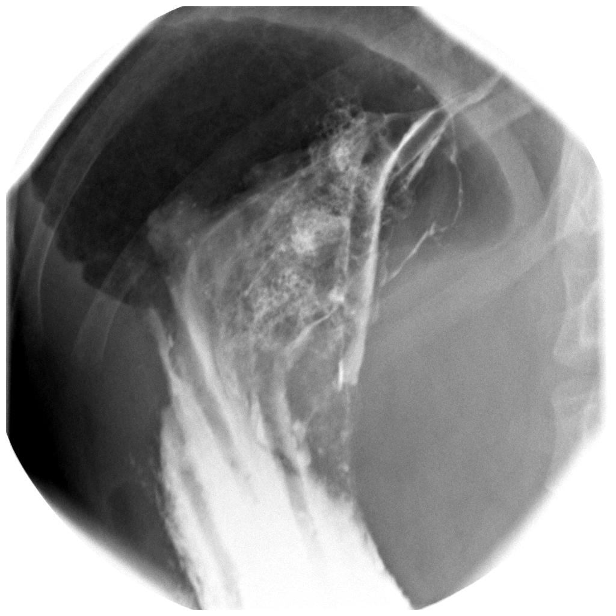

[Series 24: run · 1 of 1 slices shown (14 of 14)]
[im 1/1]
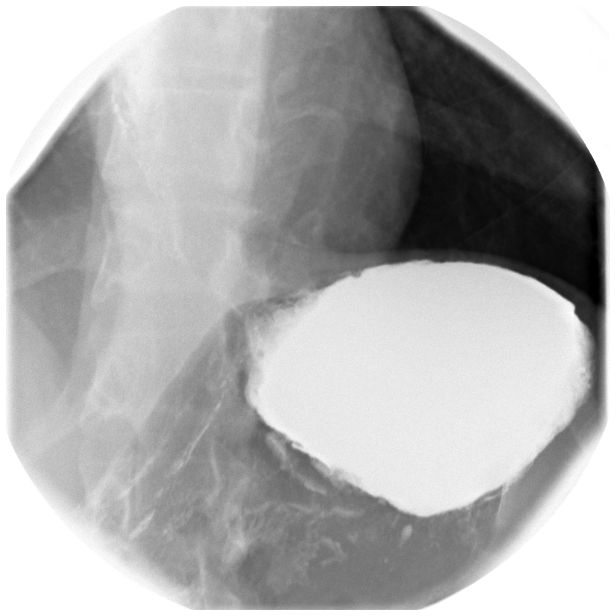

[14 of 24 positions shown; findings below may reference images not displayed]

FINDINGS: There is marked narrowing and irregularity of the distal esophagus extending down to the gastroesophageal junction.  Mild esophageal dilatation proximal to the stricture is noted.  Some of the irregularity may be due to esophageal ulcerations.  The findings are consistent with esophagitis in addition to the stricture.  However, esophageal carcinoma cannot be excluded.  
The primary stripping wave of the esophagus is intact.  There is a small hiatal hernia.  No gastroesophageal reflux was noted.
IMPRESSION: Marked distal esophageal stricture.  Extensive irregularity of the mucosa and probable ulcerations.  The major considerations are benign stricture due to chronic reflux with esophagitis versus esophageal carcinoma.  Recommend endoscopy/biopsy. Findings were called to Dr. Hartung.

## 2007-02-05 IMAGING — US US RENAL
1 series · 14 of 25 positions shown · non-contrast
Comparison: none

CLINICAL DATA: Pyelonephritis.  
RENAL/URINARY TRACT ULTRASOUND:
TECHNIQUE: Complete ultrasound examination of the urinary tract was performed including evaluation of the kidneys, renal collecting systems, and urinary bladder.

[Series 1: unknown · 0.28mm/px · 14 of 29 slices shown]
[im 1/29]
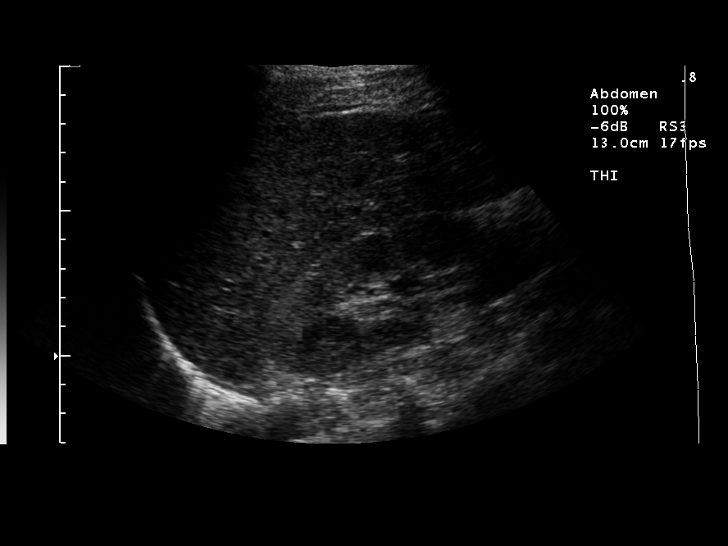
[im 3/29]
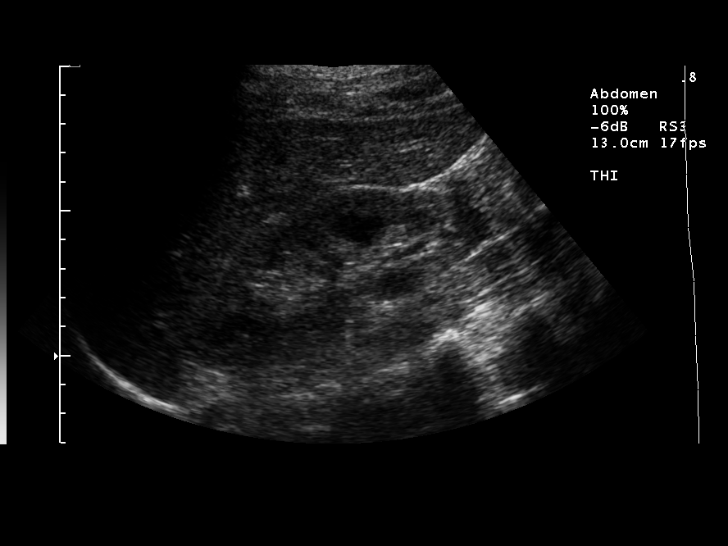
[im 5/29]
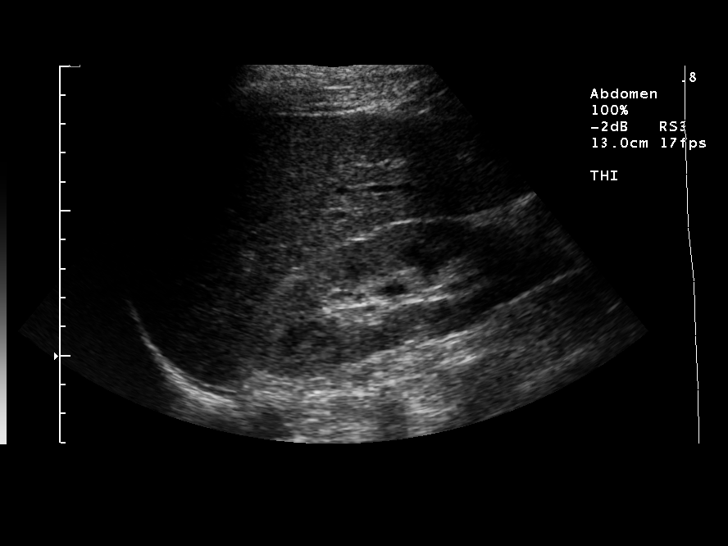
[im 8/29]
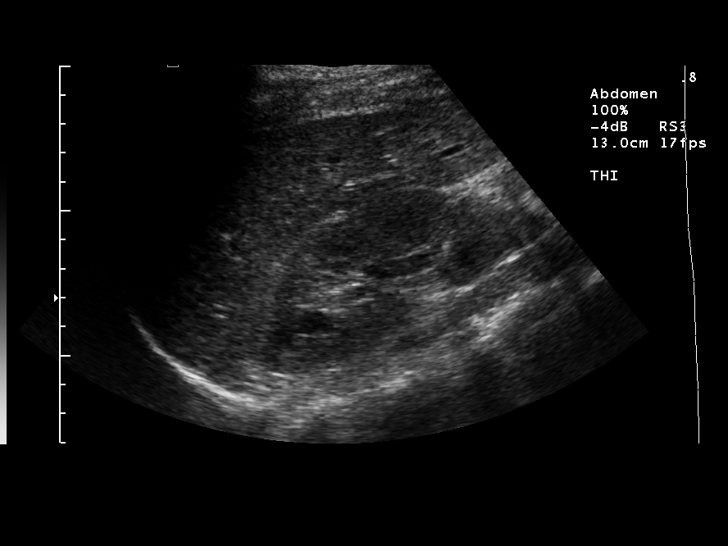
[im 10/29]
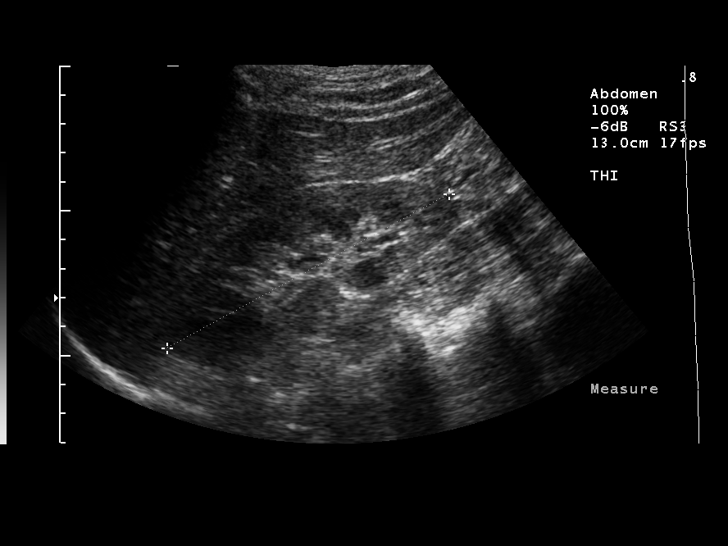
[im 11/29]
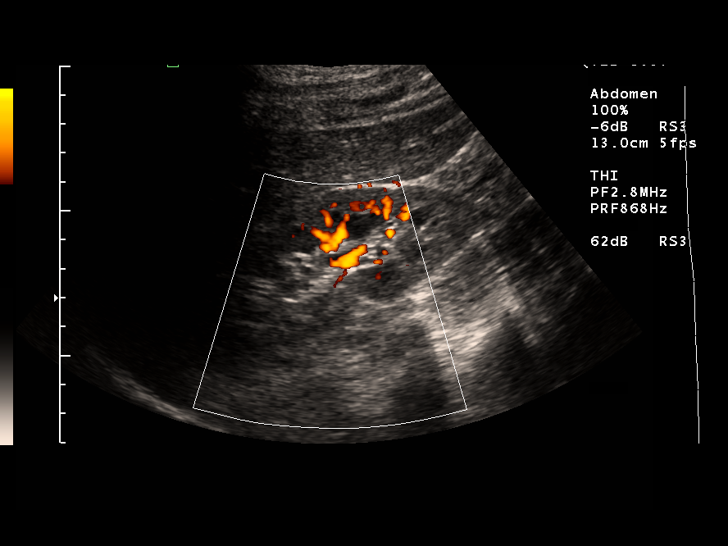
[im 13/29]
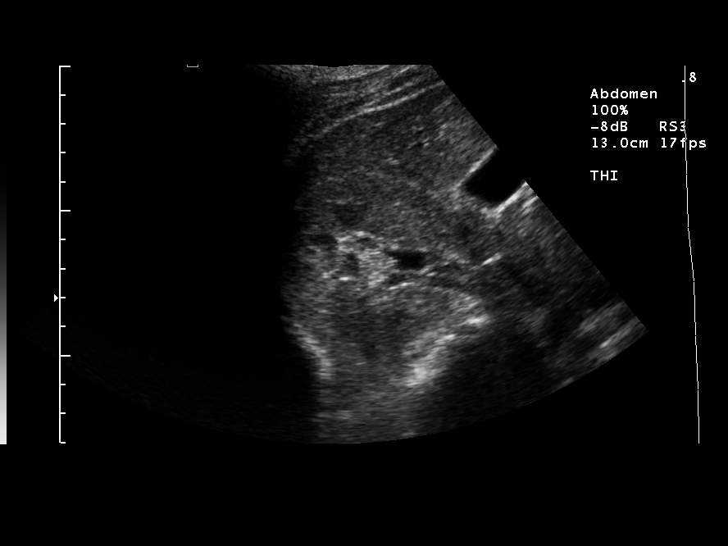
[im 16/29]
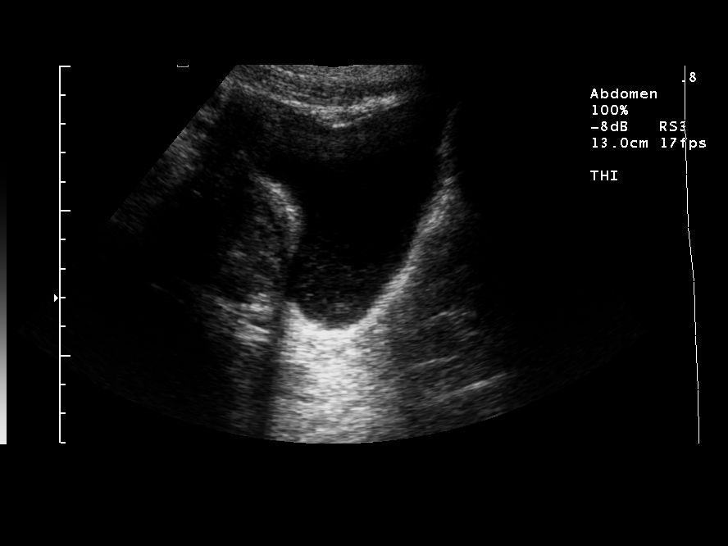
[im 18/29]
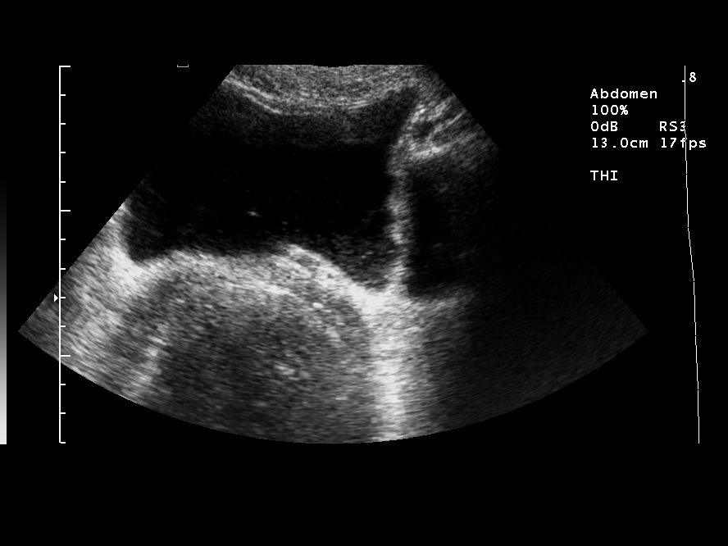
[im 19/29]
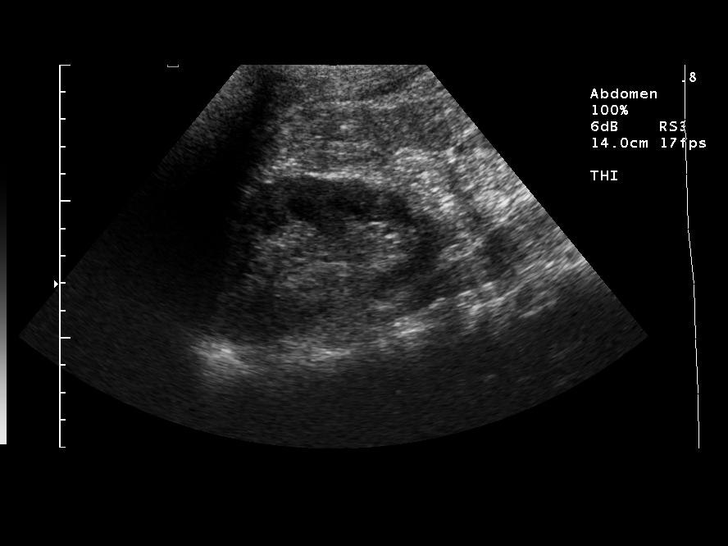
[im 22/29]
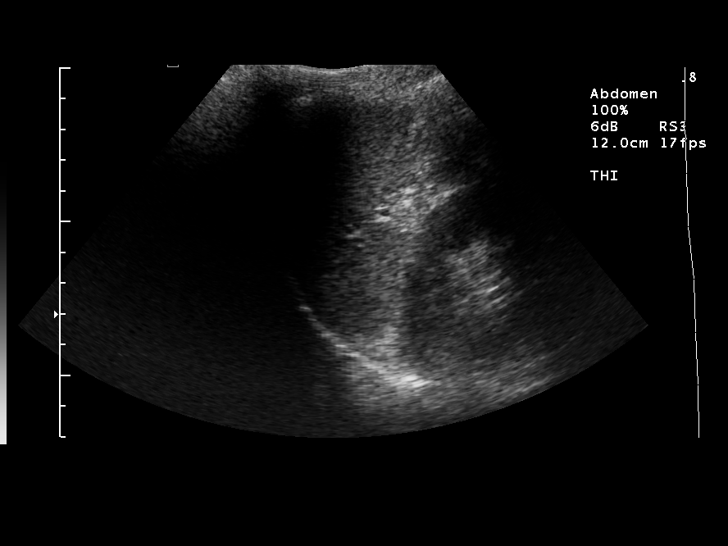
[im 24/29]
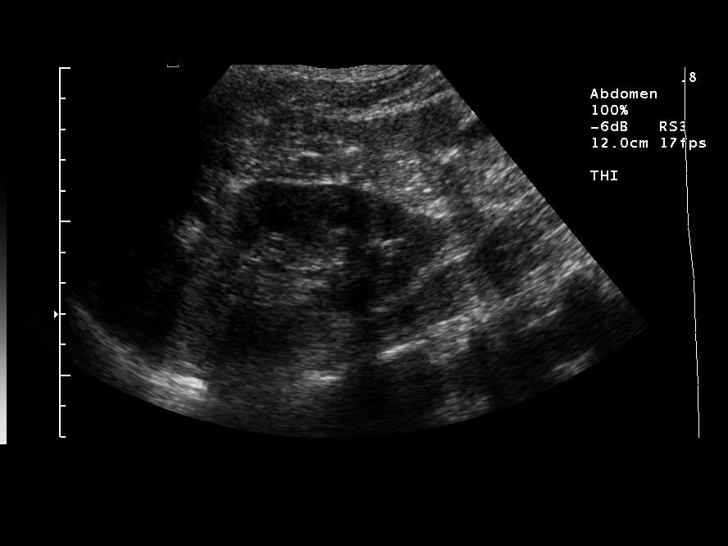
[im 26/29]
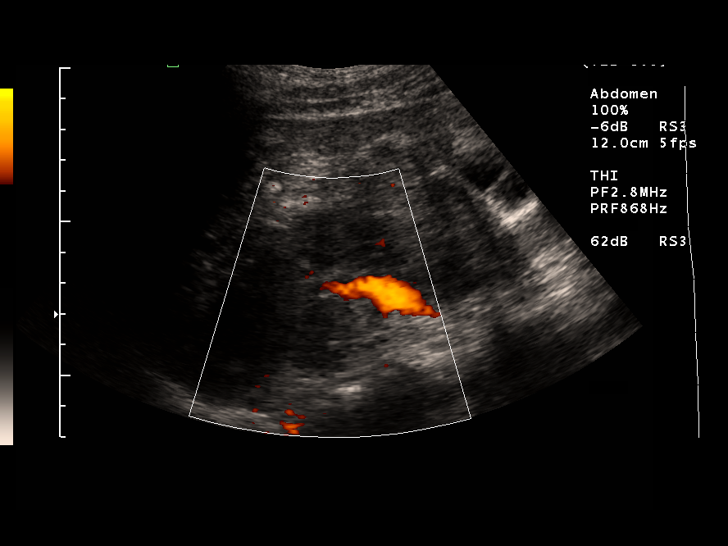
[im 29/29]
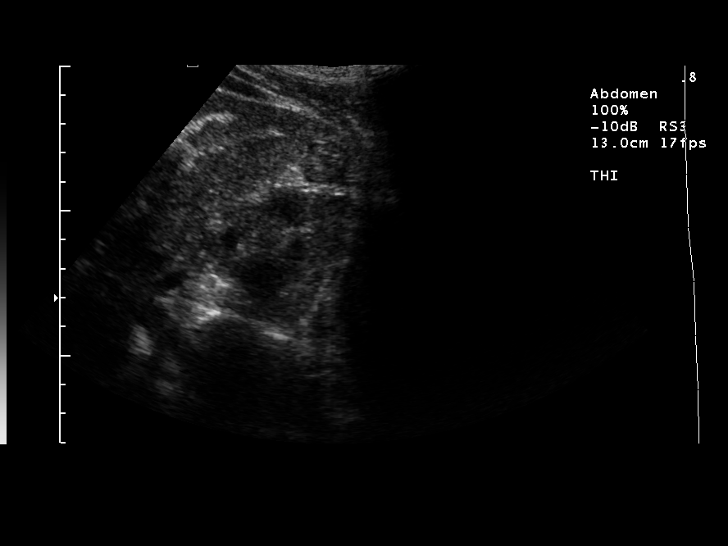

[14 of 25 positions shown; findings below may reference images not displayed]

FINDINGS: Right and left kidneys measure 11.3 cm and 9.6 cm in length, respectively.  No ultrasound findings to strongly suggest pyelonephritis.  Negative for renal or perirenal abscess.  Suggestion of mild increase in renal cortical echogenicity.  Medullary pyramids appear to standout as relatively prominent sonolucent structures compared to the echogenicity of the renal cortex.  No hydronephrosis.  There are some echoes in the bladder, which may represent debris.
IMPRESSION: Negative for renal abscess or findings to strongly suggest acute pyelonephritis.  Findings raise the question of nonspecific renal medical disease.  There may be some debris in the bladder.

## 2007-02-06 ENCOUNTER — Ambulatory Visit: Payer: Self-pay | Admitting: Family Medicine

## 2007-02-06 IMAGING — US US ABDOMEN COMPLETE
1 series · 14 of 25 positions shown · non-contrast
Comparison: none

CLINICAL DATA: Pyelonephritis.  Cholecystitis.  
 ABDOMEN ULTRASOUND:
TECHNIQUE: Complete abdominal ultrasound examination was performed including evaluation of the liver, gallbladder, bile ducts, pancreas, kidneys, spleen, IVC, and abdominal aorta.

[Series 1: unknown · 0.32mm/px · 14 of 50 slices shown]
[im 1/50]
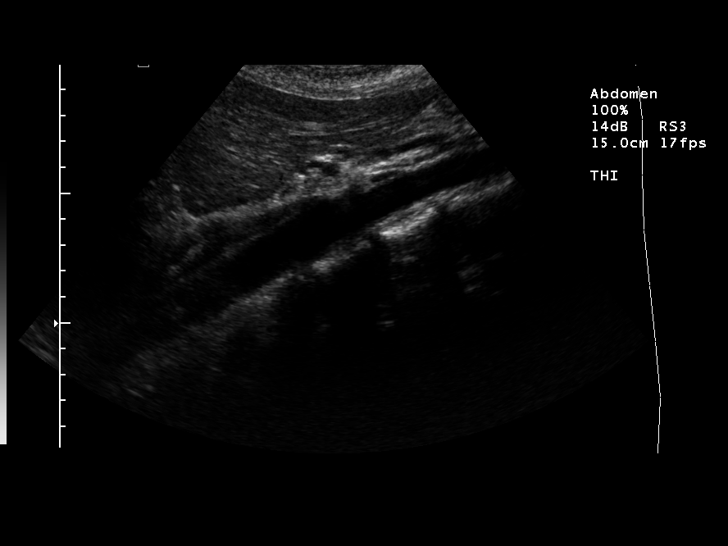
[im 5/50]
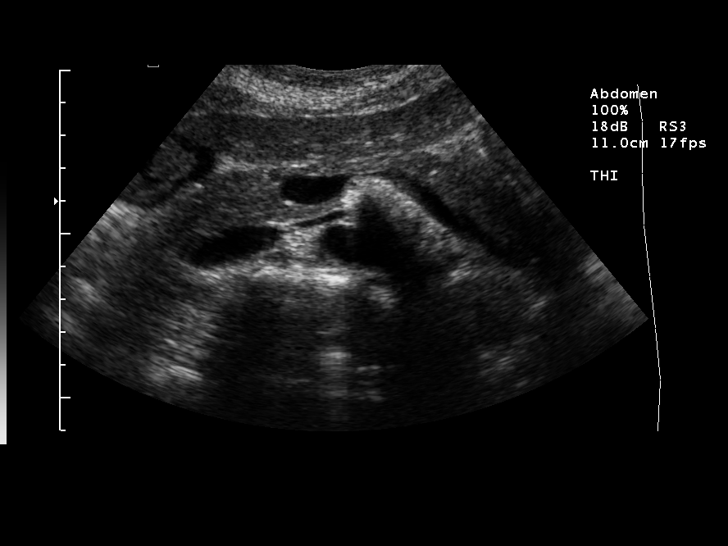
[im 9/50]
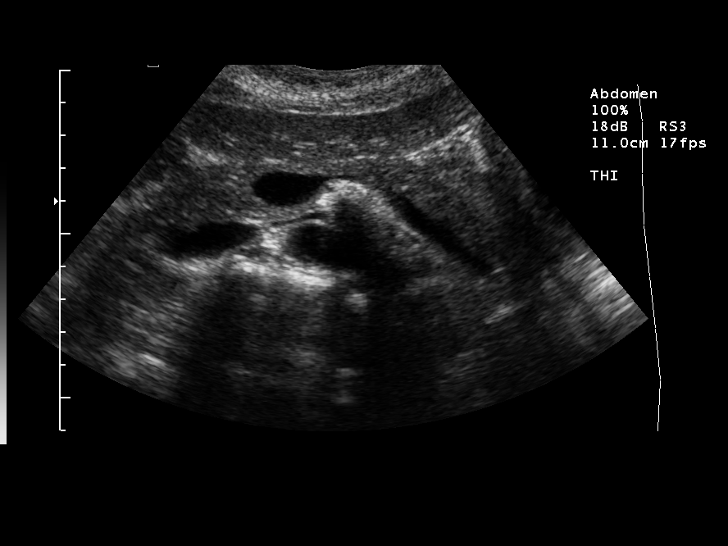
[im 13/50]
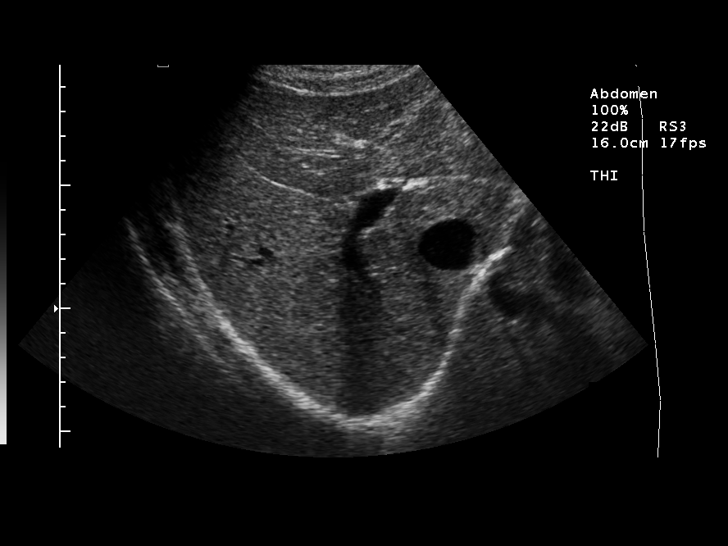
[im 17/50]
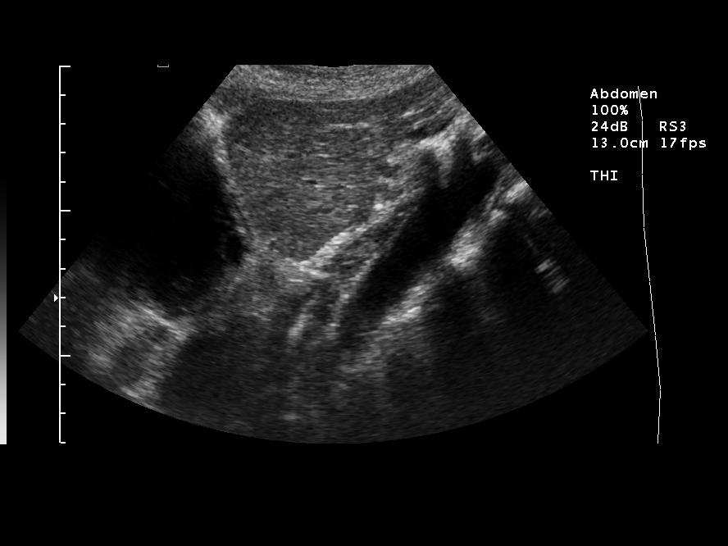
[im 19/50]
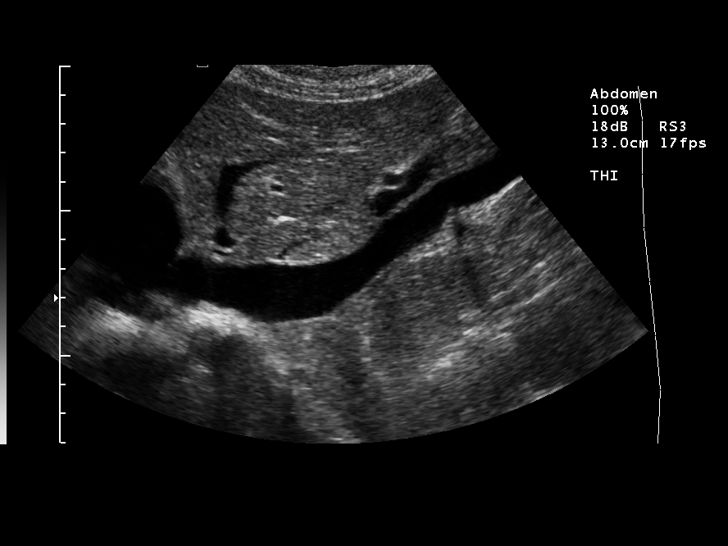
[im 23/50]
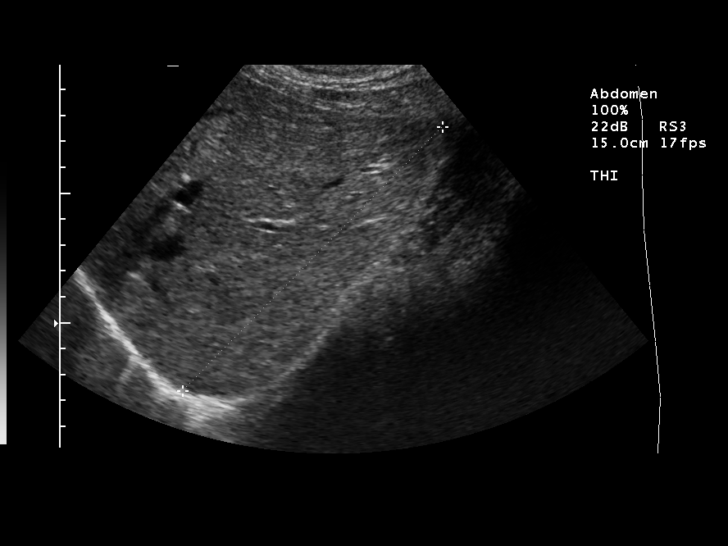
[im 27/50]
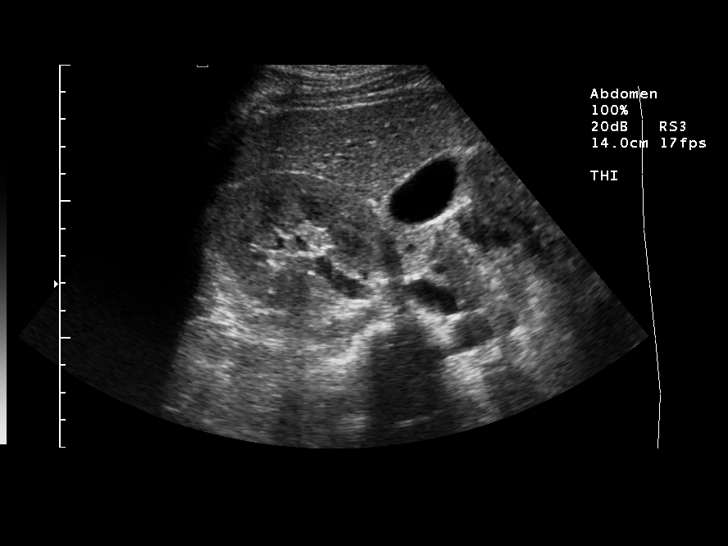
[im 31/50]
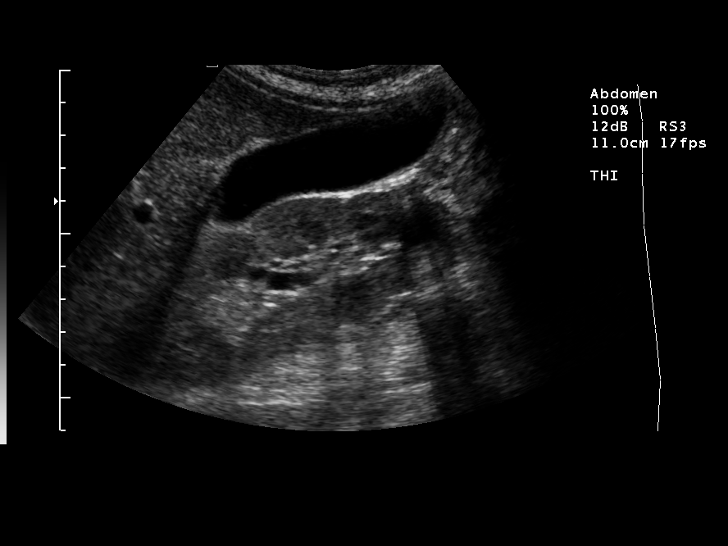
[im 33/50]
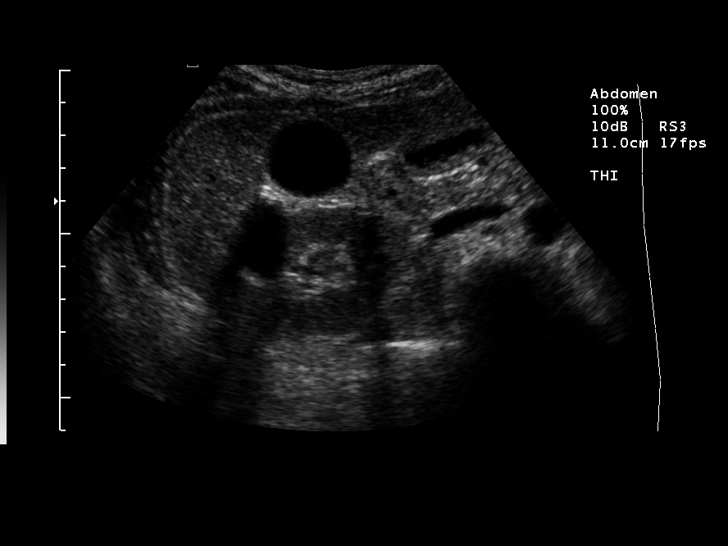
[im 37/50]
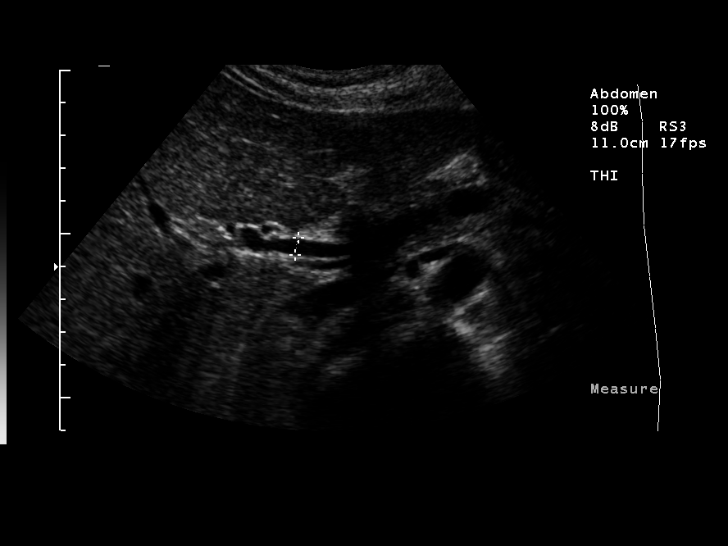
[im 41/50]
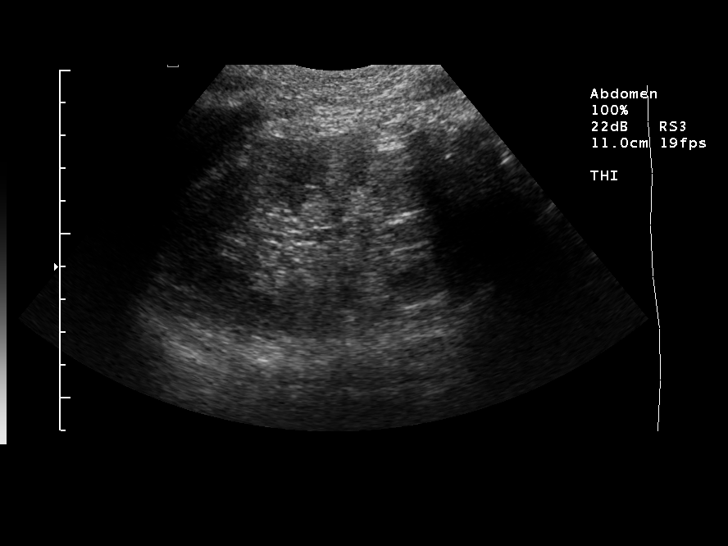
[im 45/50]
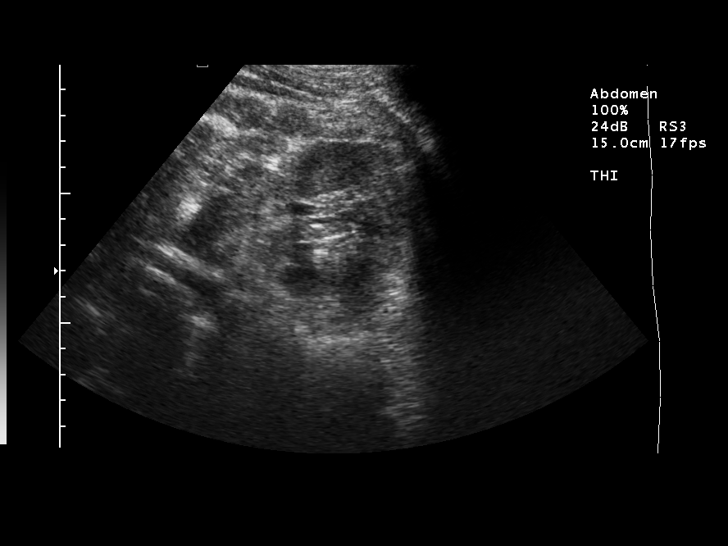
[im 50/50]
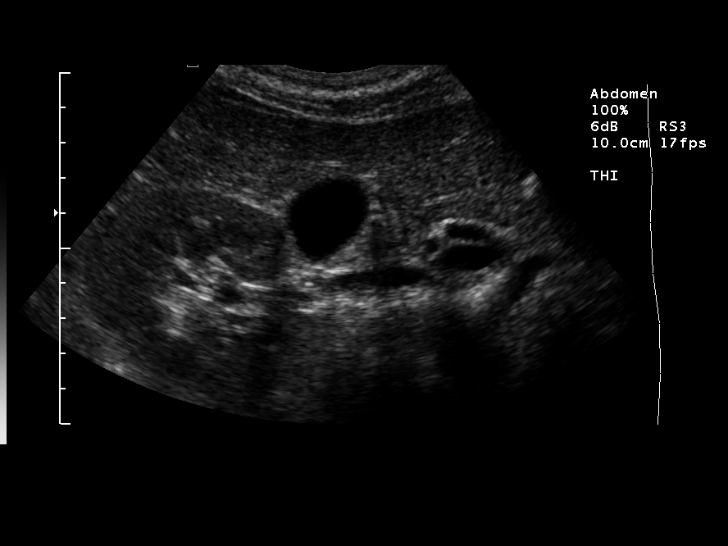

[14 of 25 positions shown; findings below may reference images not displayed]

FINDINGS: There is no evidence of gallstones or biliary ductal dilatation.  The liver is within normal limits in echogenicity, and no focal liver lesions are seen.  The visualized portions of the IVC and pancreas are unremarkable.
 There is no evidence of splenomegaly.  The kidneys are unremarkable, and there is no evidence of hydronephrosis.  The abdominal aorta is non-dilated.
IMPRESSION: Negative abdominal ultrasound.

## 2007-03-25 ENCOUNTER — Encounter (INDEPENDENT_AMBULATORY_CARE_PROVIDER_SITE_OTHER): Payer: Self-pay | Admitting: Family Medicine

## 2007-03-25 DIAGNOSIS — E785 Hyperlipidemia, unspecified: Secondary | ICD-10-CM | POA: Insufficient documentation

## 2007-03-25 DIAGNOSIS — F329 Major depressive disorder, single episode, unspecified: Secondary | ICD-10-CM

## 2007-03-25 DIAGNOSIS — F411 Generalized anxiety disorder: Secondary | ICD-10-CM

## 2007-03-27 ENCOUNTER — Ambulatory Visit: Payer: Self-pay | Admitting: Family Medicine

## 2007-03-27 ENCOUNTER — Encounter: Payer: Self-pay | Admitting: Family Medicine

## 2007-03-27 ENCOUNTER — Encounter (INDEPENDENT_AMBULATORY_CARE_PROVIDER_SITE_OTHER): Payer: Self-pay | Admitting: Family Medicine

## 2007-03-27 DIAGNOSIS — F1910 Other psychoactive substance abuse, uncomplicated: Secondary | ICD-10-CM

## 2007-03-27 DIAGNOSIS — E109 Type 1 diabetes mellitus without complications: Secondary | ICD-10-CM

## 2007-03-27 DIAGNOSIS — K219 Gastro-esophageal reflux disease without esophagitis: Secondary | ICD-10-CM

## 2007-03-27 DIAGNOSIS — N189 Chronic kidney disease, unspecified: Secondary | ICD-10-CM | POA: Insufficient documentation

## 2007-04-17 ENCOUNTER — Encounter (INDEPENDENT_AMBULATORY_CARE_PROVIDER_SITE_OTHER): Payer: Self-pay | Admitting: *Deleted

## 2007-05-24 ENCOUNTER — Encounter (INDEPENDENT_AMBULATORY_CARE_PROVIDER_SITE_OTHER): Payer: Self-pay | Admitting: Family Medicine

## 2007-05-27 ENCOUNTER — Telehealth (INDEPENDENT_AMBULATORY_CARE_PROVIDER_SITE_OTHER): Payer: Self-pay | Admitting: *Deleted

## 2007-05-28 ENCOUNTER — Encounter (INDEPENDENT_AMBULATORY_CARE_PROVIDER_SITE_OTHER): Payer: Self-pay | Admitting: Family Medicine

## 2007-05-30 ENCOUNTER — Encounter (INDEPENDENT_AMBULATORY_CARE_PROVIDER_SITE_OTHER): Payer: Self-pay | Admitting: Family Medicine

## 2007-06-08 ENCOUNTER — Emergency Department (HOSPITAL_COMMUNITY): Admission: EM | Admit: 2007-06-08 | Discharge: 2007-06-08 | Payer: Self-pay | Admitting: Emergency Medicine

## 2007-06-17 ENCOUNTER — Ambulatory Visit: Payer: Self-pay | Admitting: Family Medicine

## 2007-06-17 DIAGNOSIS — M25579 Pain in unspecified ankle and joints of unspecified foot: Secondary | ICD-10-CM

## 2007-06-17 LAB — CONVERTED CEMR LAB: Blood Glucose, Fingerstick: 114

## 2007-06-24 ENCOUNTER — Ambulatory Visit (HOSPITAL_COMMUNITY): Admission: RE | Admit: 2007-06-24 | Discharge: 2007-06-24 | Payer: Self-pay | Admitting: Family Medicine

## 2007-06-25 ENCOUNTER — Telehealth (INDEPENDENT_AMBULATORY_CARE_PROVIDER_SITE_OTHER): Payer: Self-pay | Admitting: Family Medicine

## 2007-06-25 DIAGNOSIS — M86179 Other acute osteomyelitis, unspecified ankle and foot: Secondary | ICD-10-CM | POA: Insufficient documentation

## 2007-07-01 ENCOUNTER — Encounter (INDEPENDENT_AMBULATORY_CARE_PROVIDER_SITE_OTHER): Payer: Self-pay | Admitting: Family Medicine

## 2007-07-11 ENCOUNTER — Encounter (INDEPENDENT_AMBULATORY_CARE_PROVIDER_SITE_OTHER): Payer: Self-pay | Admitting: Family Medicine

## 2007-08-05 ENCOUNTER — Telehealth (INDEPENDENT_AMBULATORY_CARE_PROVIDER_SITE_OTHER): Payer: Self-pay | Admitting: *Deleted

## 2007-08-13 ENCOUNTER — Ambulatory Visit: Payer: Self-pay | Admitting: Family Medicine

## 2007-08-13 DIAGNOSIS — E11319 Type 2 diabetes mellitus with unspecified diabetic retinopathy without macular edema: Secondary | ICD-10-CM | POA: Insufficient documentation

## 2007-08-13 DIAGNOSIS — E162 Hypoglycemia, unspecified: Secondary | ICD-10-CM

## 2007-08-23 ENCOUNTER — Encounter (INDEPENDENT_AMBULATORY_CARE_PROVIDER_SITE_OTHER): Payer: Self-pay | Admitting: Family Medicine

## 2007-09-04 IMAGING — CT CT HEAD W/O CM
1 of 2 series · 13 of 30 positions shown, 17 images · non-contrast
Comparison: none

CLINICAL DATA: Left-sided weakness and numbness

[Series 2: brain · axial · 0.47mm/px · z∈[-134,-11]mm · 13 of 28 slices shown, 17 images]
[im 2/28  brain]
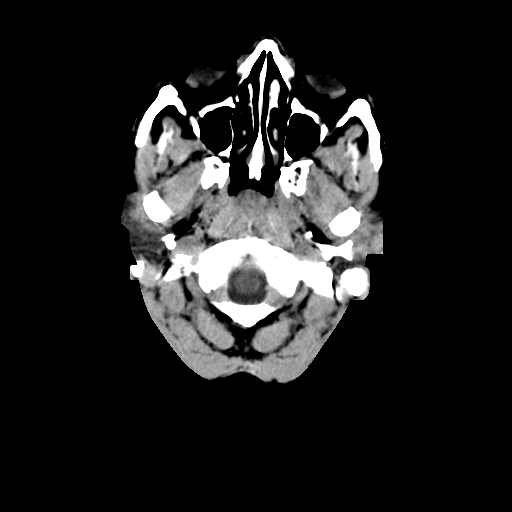
[im 2/28  bone]
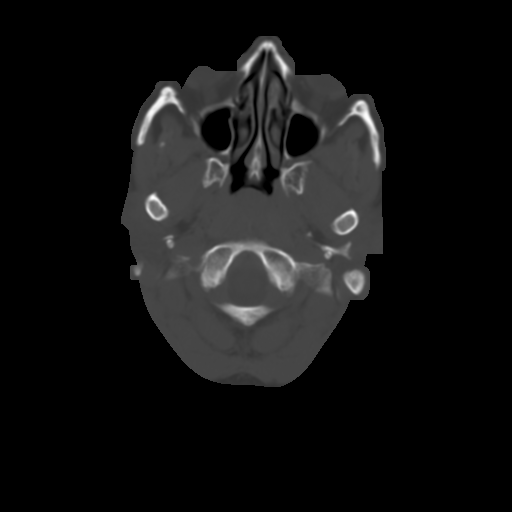
[im 4/28  brain]
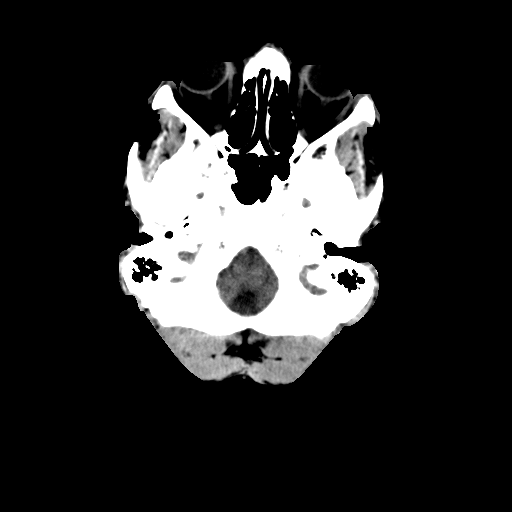
[im 6/28  brain]
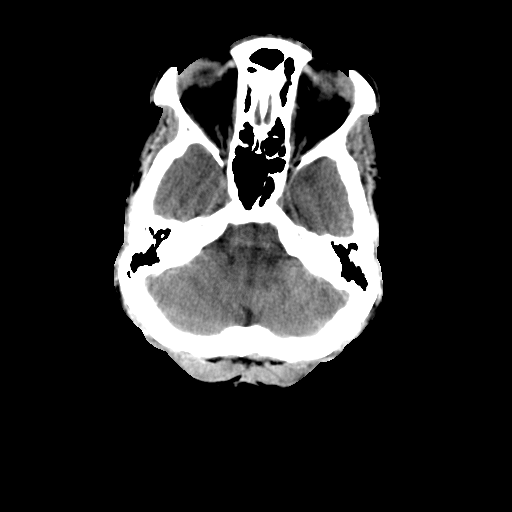
[im 8/28  brain]
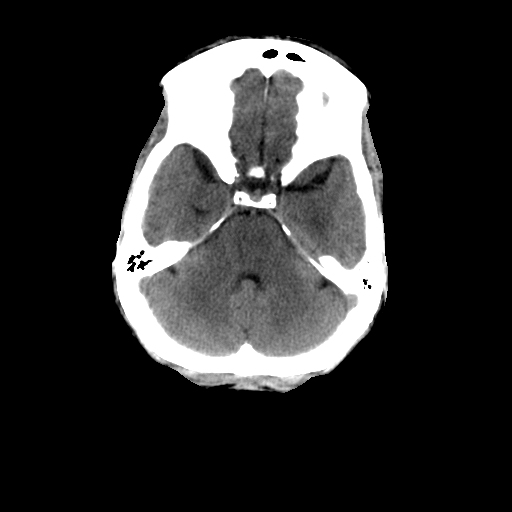
[im 10/28  brain]
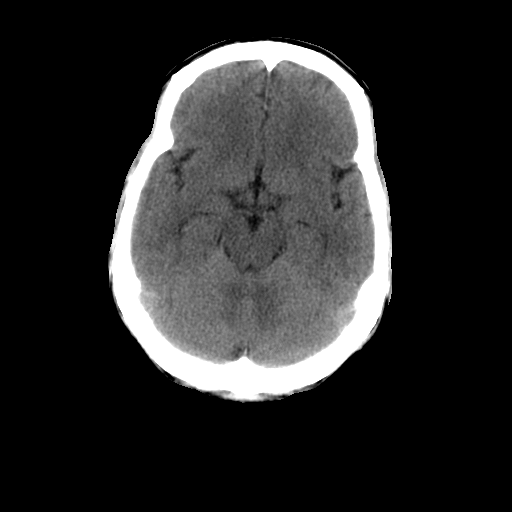
[im 10/28  bone]
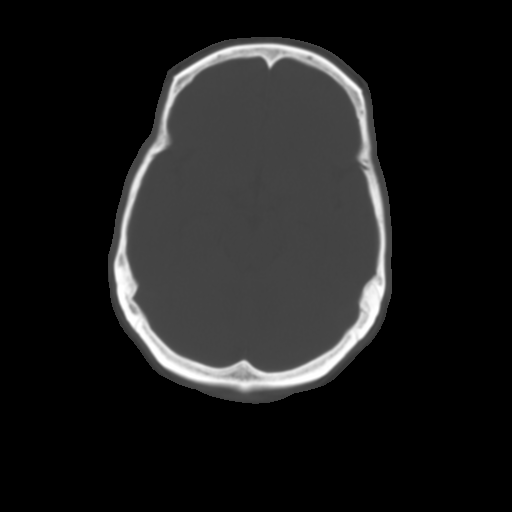
[im 12/28  brain]
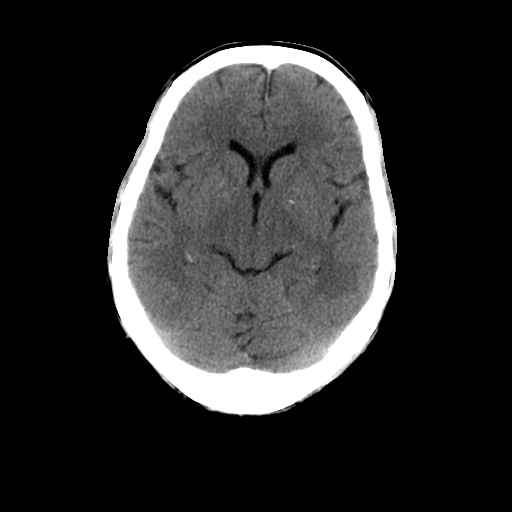
[im 14/28  brain]
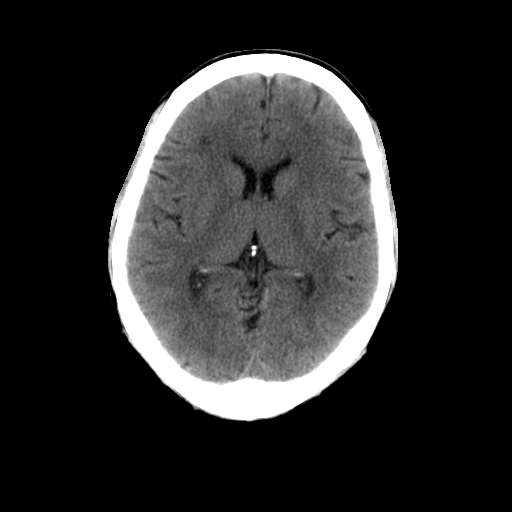
[im 16/28  brain]
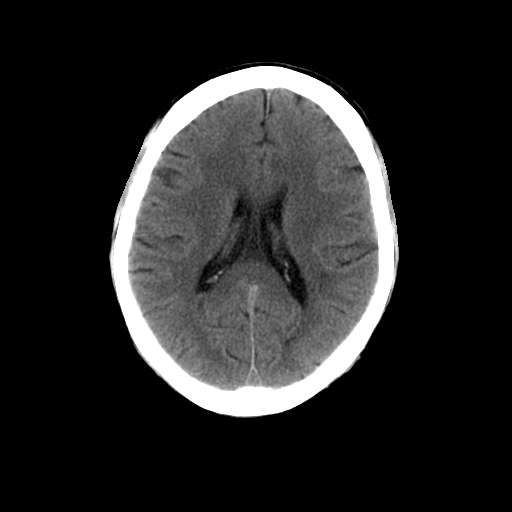
[im 18/28  brain]
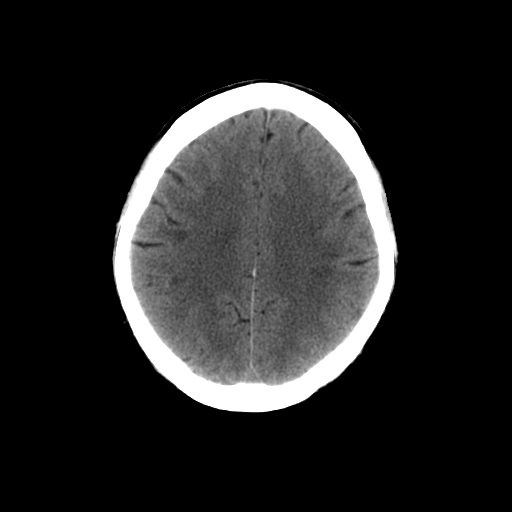
[im 18/28  bone]
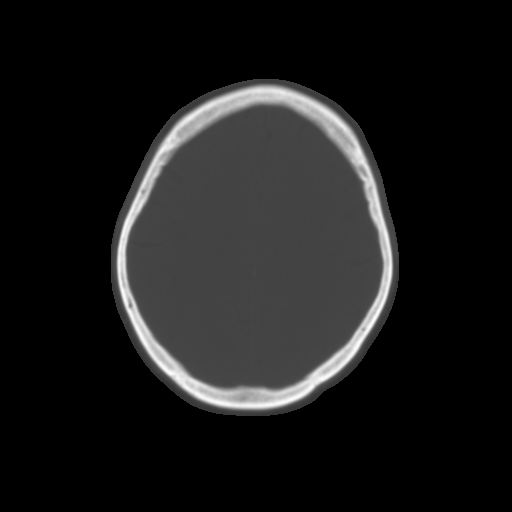
[im 20/28  brain]
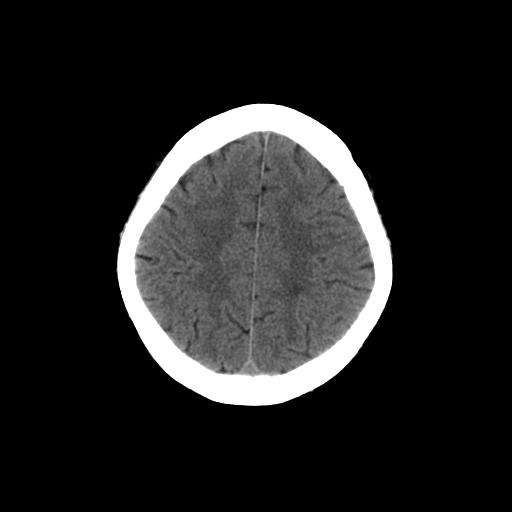
[im 22/28  brain]
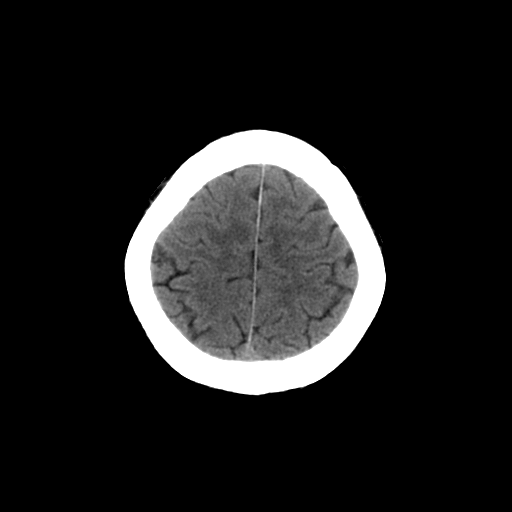
[im 24/28  brain]
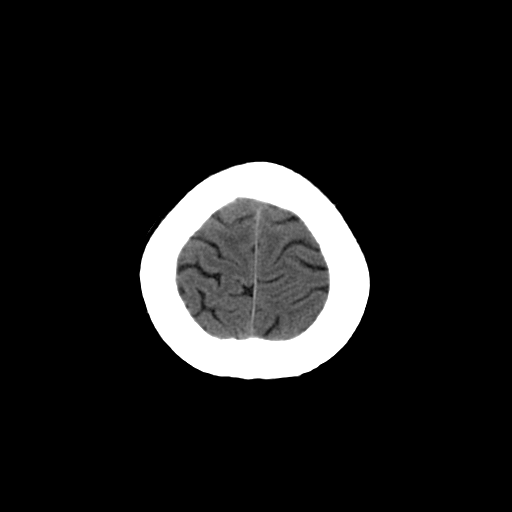
[im 26/28  brain]
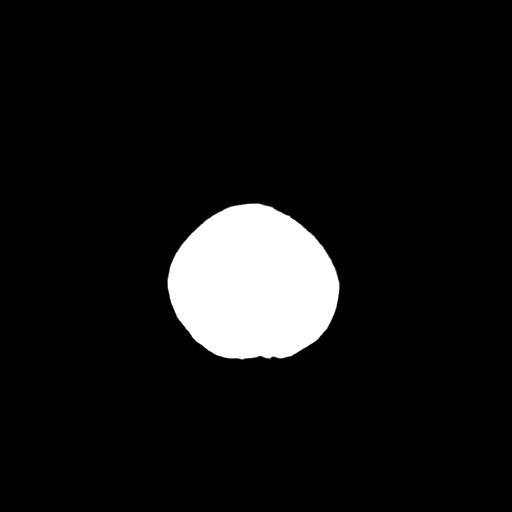
[im 26/28  bone]
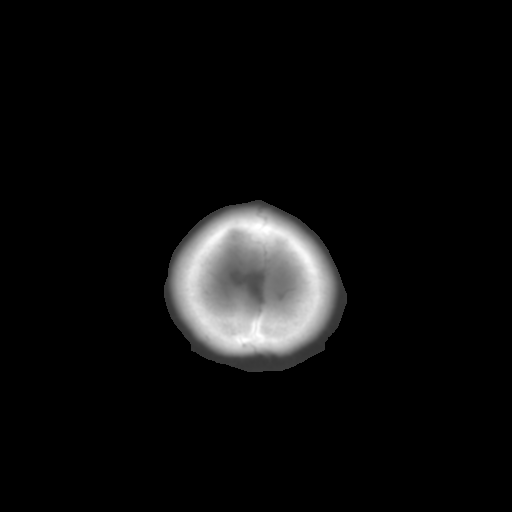

[13 of 30 positions shown; findings below may reference images not displayed]

CT head without contrast:

Comparison 08/31/1999 by report only. Patchy focal areas of hypoattenuation in
deep and periventricular white matter, more prominent than typically seen for
age. Negative for acute intracranial hemorrhage , midline shift, or mass-effect.
Basal ganglia   calcifications, which were described on previous study.
Ventricles and sulci remain normal in size and symmetry  Bone windows
unremarkable.
IMPRESSION: 1. Prominent focal areas of ill-defined hypoattenuation in deep and
periventricular white matter, more conspicuous than typical for age. Consider MR
for further characterization.
2. Negative for bleed or other acute intracranial process.

## 2007-09-04 IMAGING — CR DG CHEST 1V PORT
1 series · 1 of 1 positions shown · non-contrast
Comparison: Portable chest x-ray earlier in the [AGE] hours.

CLINICAL DATA: PICC placement.

PORTABLE CHEST - 1 VIEW  [DATE]/[id number] hours:

[view not recorded]
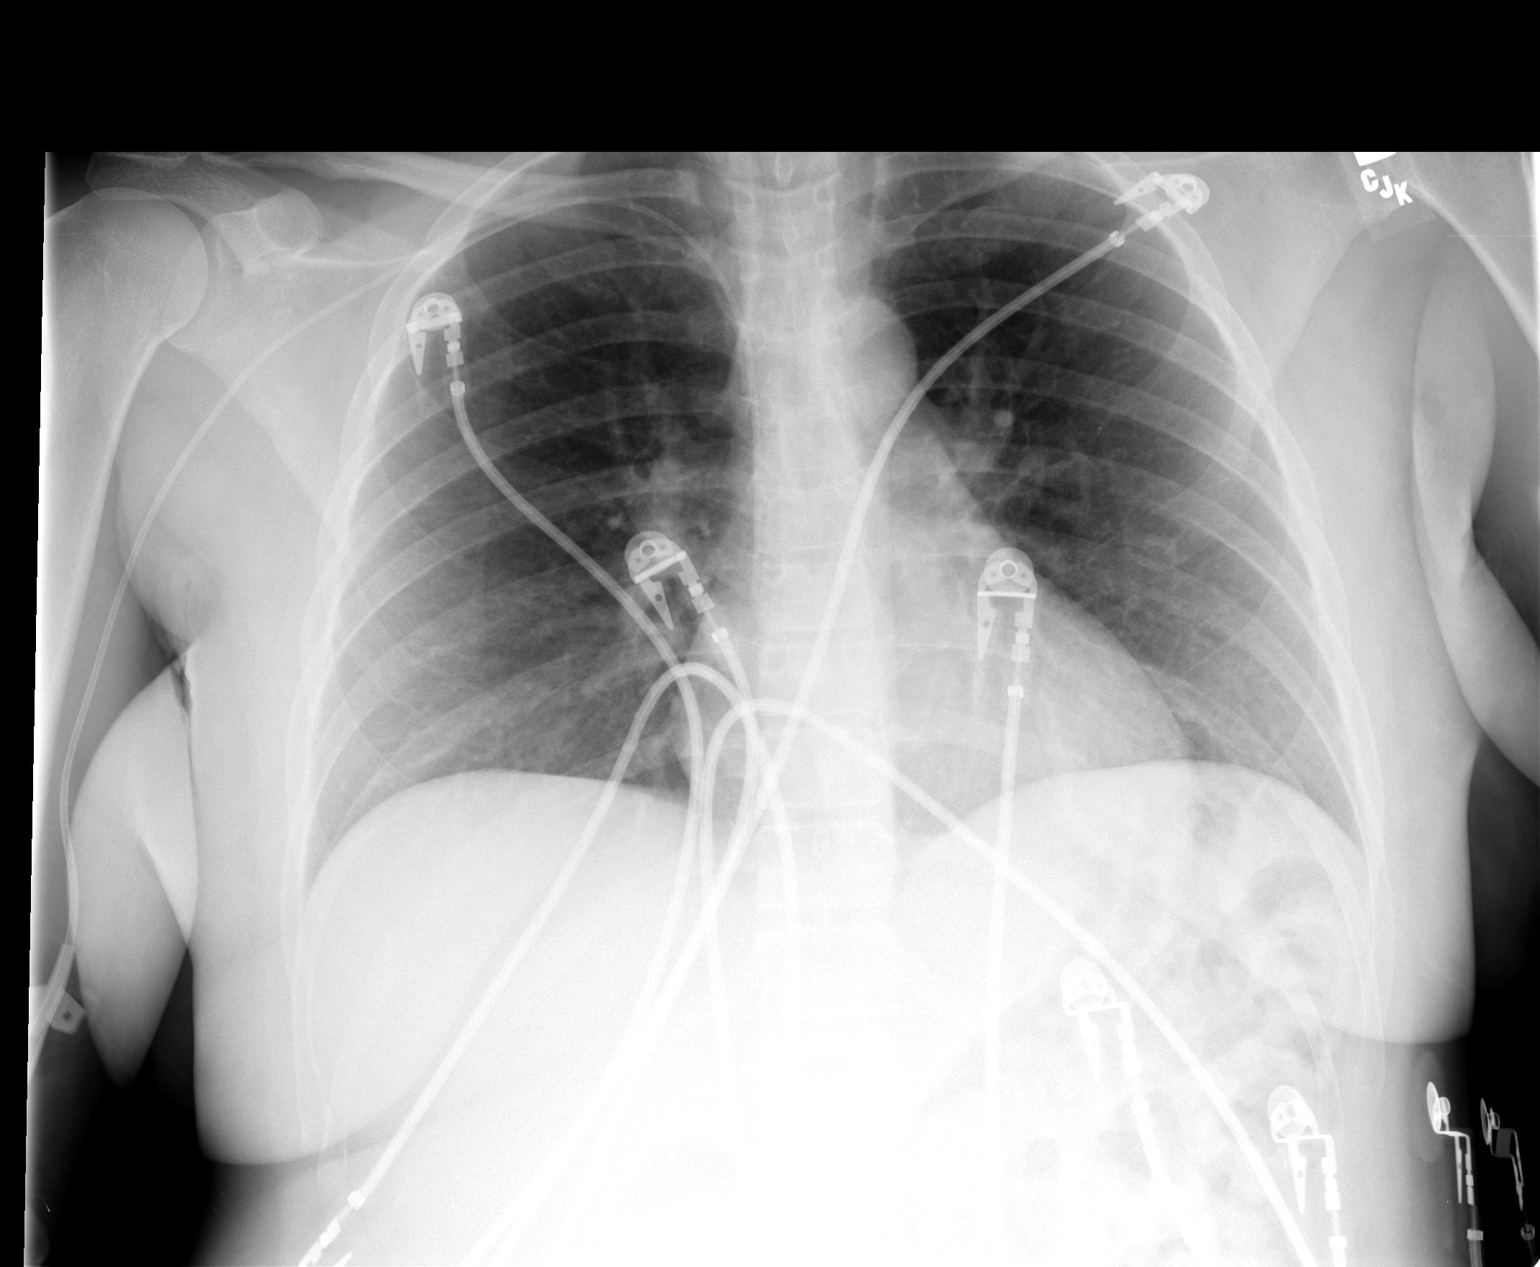

[1 of 1 positions shown; findings below may reference images not displayed]

FINDINGS: Right arm PICC tip in the lower SVC. Cardiomediastinal silhouette
unremarkable. Lungs remain clear.
IMPRESSION: 1. Right arm PICC tip in the lower SVC.
2. No acute cardiopulmonary disease.

## 2007-09-04 IMAGING — MR MR MRA NECK WO/W CM
12 of 18 series · 23 of 48 positions shown · IV contrast (20    MAGNEVIST)
Comparison: No comparison MRI.  Comparison CT 02/02/07.

CLINICAL DATA: Left arm numb with tingling and weakness.  
MRI BRAIN WITHOUT AND WITH CONTRAST:
TECHNIQUE: Multiplanar and multiecho pulse sequences of the brain and surrounding structures were obtained according to standard protocol before and after administration of intravenous contrast.
Contrast:  20cc Magnevist.
TECHNIQUE: 3-D time of flight pulse sequence was performed to examine the cerebral vasculature, centered at the circle of Willis, without IV contrast.  Multiplanar MR image reconstructions were generated to evaluate the vascular anatomy.
TECHNIQUE: 2-D and 3-D time of flight pulse sequences were performed to examine the cranial vasculature from the aortic arch to the circle of Willis, centered at the carotid bifurcation, both before and after bolus injection of intravenous contrast.  Multiplanar MR image reconstructions were generated to evaluate the vascular anatomy.

[Series 1: 3 plane loc · axial · 5.0mm · 0.94mm/px · 1 of 9 slices shown]
[im 1/9]
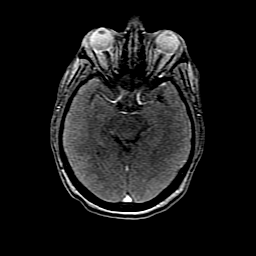

[Series 2: T1 · sagittal · 5.0mm · 0.43mm/px · 1 of 17 slices shown (1 of 3)]
[im 1/17]
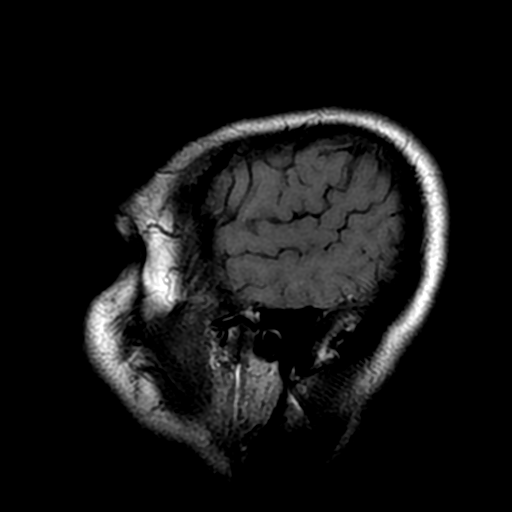

[Series 3: DWI · axial · 5.0mm · 1.25mm/px · 1 of 48 slices shown]
[im 1/48]
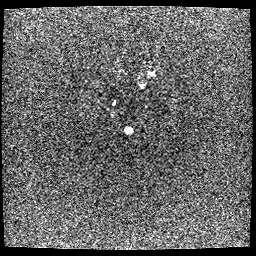

[Series 5: T2 · axial · 5.0mm · 0.43mm/px · 1 of 19 slices shown (1 of 2)]
[im 1/19]
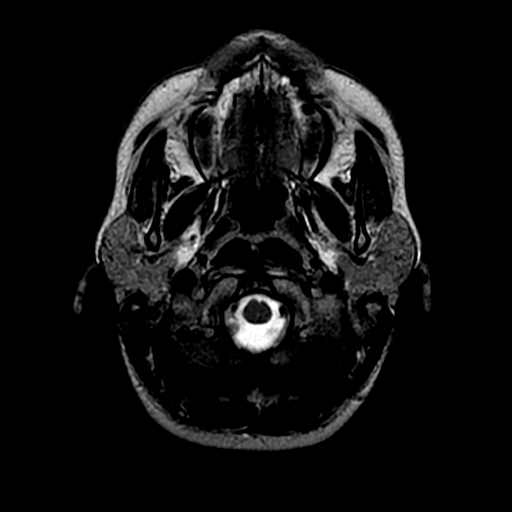

[Series 6: FLAIR · axial · 5.0mm · 0.43mm/px · 1 of 19 slices shown]
[im 1/19]
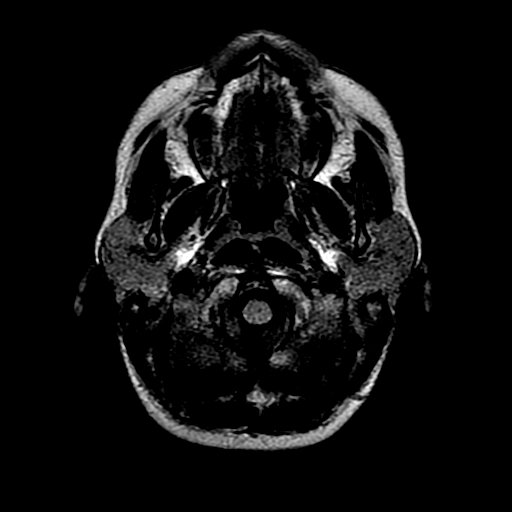

[Series 7: T2-star · axial · 5.0mm · 0.43mm/px · 1 of 19 slices shown]
[im 1/19]
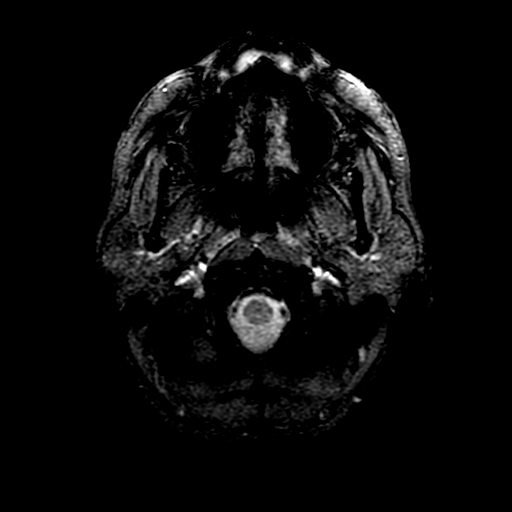

[Series 9: T2 · coronal · 5.0mm · 0.43mm/px · 1 of 20 slices shown (2 of 2)]
[im 1/20]
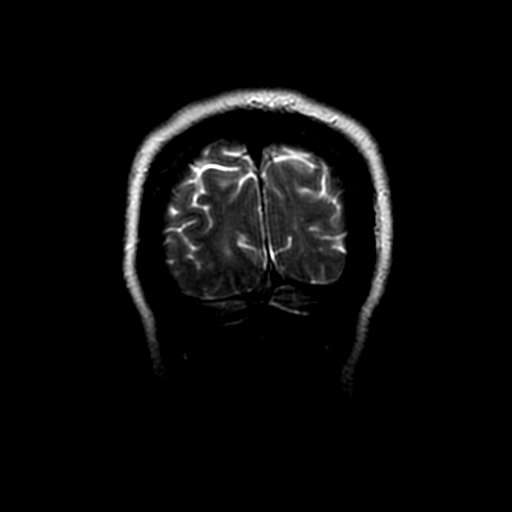

[Series 10: loc cor · axial · 5.0mm · 0.68mm/px · 1 of 21 slices shown]
[im 1/21]
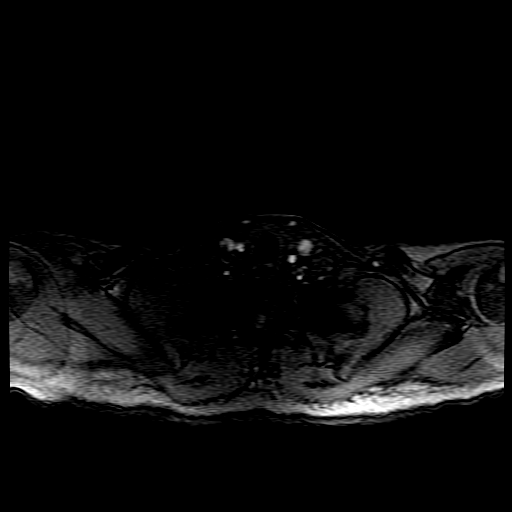

[Series 11: ax (id) spgr · axial · 2.6mm · 0.78mm/px · z∈[-197,-7]mm · 7 of 140 slices shown]
[im 1/140]
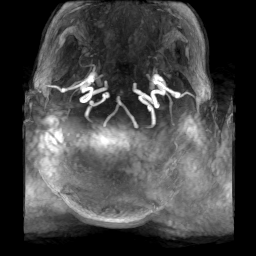
[im 24/140]
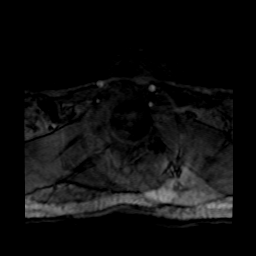
[im 47/140]
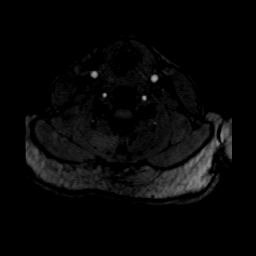
[im 70/140]
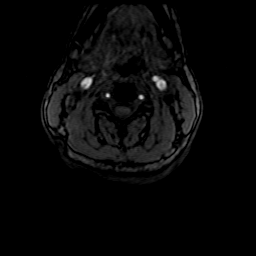
[im 93/140]
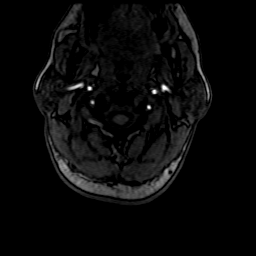
[im 116/140]
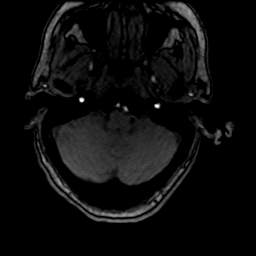
[im 140/140]
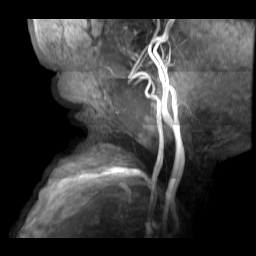

[Series 12: mask · sagittal · 1.4mm · 0.55mm/px · 6 of 132 slices shown]
[im 1/132]
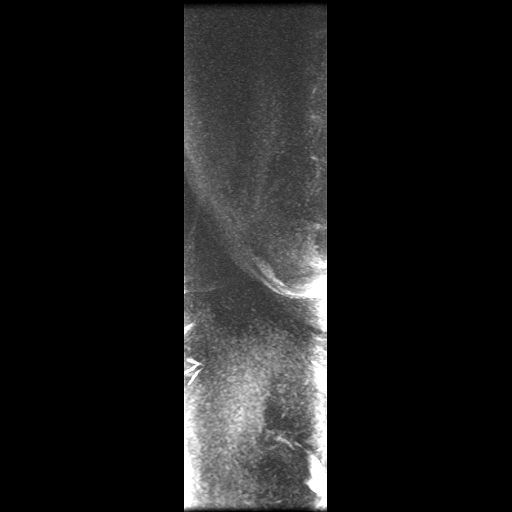
[im 27/132]
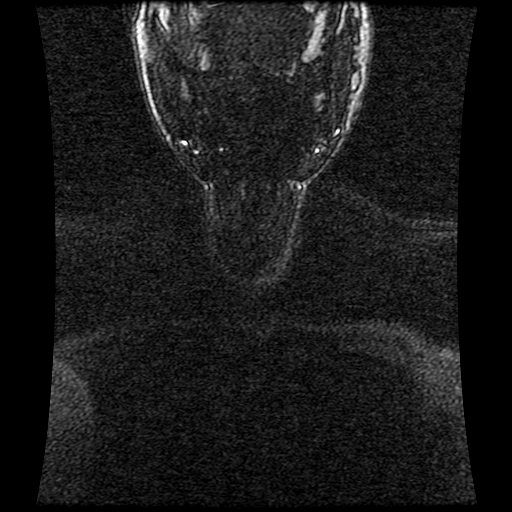
[im 53/132]
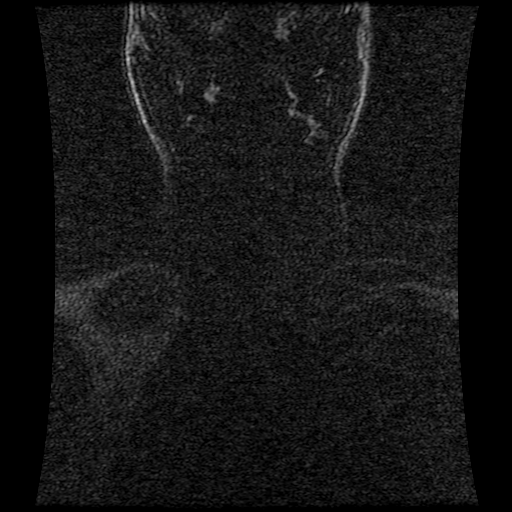
[im 79/132]
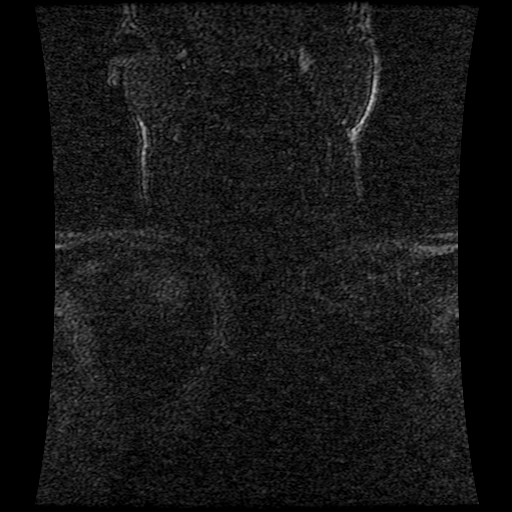
[im 105/132]
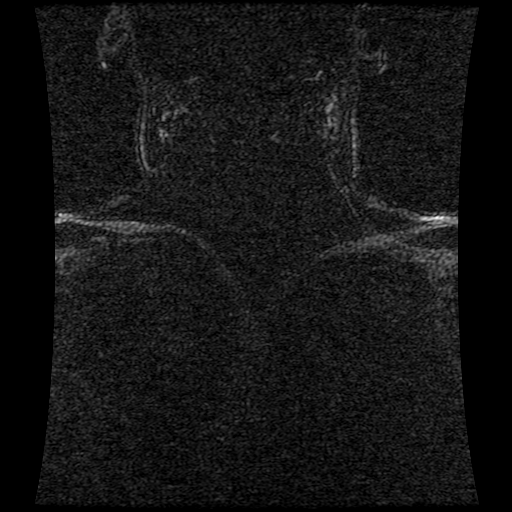
[im 132/132]
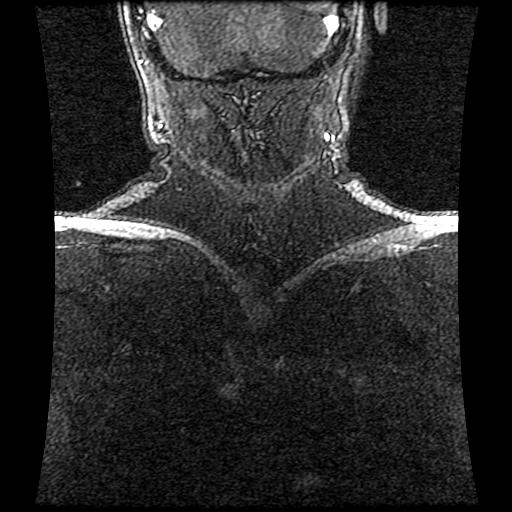

[Series 16: T1 · sagittal · 5.0mm · 0.43mm/px · 1 of 17 slices shown (2 of 3)]
[im 1/17]
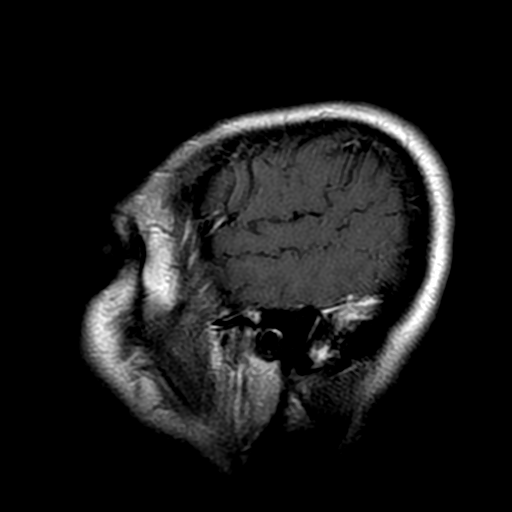

[Series 17: T1 · sagittal · 5.0mm · 0.43mm/px · 1 of 17 slices shown (3 of 3)]
[im 1/17]
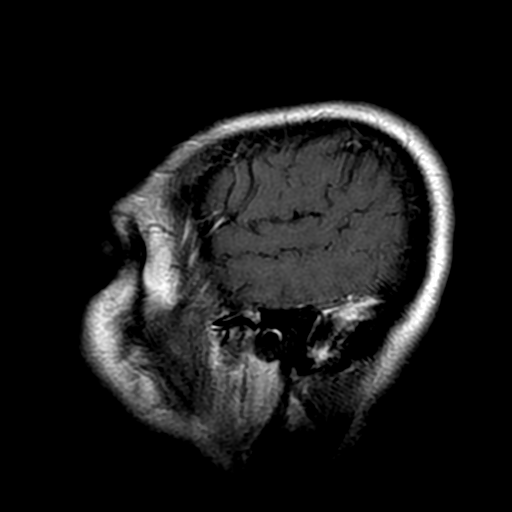

[23 of 48 positions shown; findings below may reference images not displayed]

FINDINGS: No acute infarct or abnormal intracranial enhancing lesion.  Several intracranial patchy white-matter type changes supratentorial periventricular and subcortical region bilaterally.  Additionally, there are white matter-type changes involving the pons.  The involvement of the pons raises the possibility of white matter changes related to small-vessel disease.  Is the patient hypertensive and/or diabetic?  In a patient of this age and gender, demyelinating process is also a prime consideration.  Other causes of white matter-type changes such as that related to; vasculitis, inflammatory process, or migraine headaches cannot be completely excluded.  Clinical and laboratory correlation and followup imaging may be necessary for further delineation.  Exophthalmus.
IMPRESSION: 1.  Prominent nonspecific white matter-type changes with above described considerations.
2.  No acute infarct.  
MR ANGIOGRAPHY OF HEAD WITHOUT CONTRAST:
FINDINGS: Prominent focal stenosis right internal carotid artery cavernous segment.  This has an appearance suggesting that the apparent narrowing may be at least partially caused by artifact.  Mild narrowing of the left internal carotid artery cavernous segment.  Mild narrowing proximal A1 and M1 segment of the left anterior cerebral artery and middle cerebral artery respectively.  Fenestration distal A1 segment left anterior cerebral artery.  No associated aneurysm.  Aneurysm 5mm or smaller may not be detected by the present technique.  If it were necessary to exclude an aneurysm of this size or to evaluate for the possibility of underlying vasculitis or to grade intracranial stenosis, a catheter angiogram would be necessary.  Moderate narrowing of proximal middle cerebral artery branch vessels bilaterally.  Mild narrowing distal basilar artery I suspect is artifactual in origin.  Branch-vessel narrowing may be related to limitation of present exam versus atherosclerotic-type changes or possibly vasculitis.  Mild narrowing distal right vertebral artery.
IMPRESSION: 1.  Mild irregularity of medium and small-sized vessels may represent limitation of present exam, atherosclerotic-type changes or vasculitis.  
2.  Narrowing of the cavernous segment of the internal carotid arteries, right greater than left, may be partially explained by artifact.  
MR ANGIOGRAPHY OF NECK WITHOUT AND WITH CONTRAST:
FINDINGS: Common origin of the right innominate artery and left common carotid artery.  No evidence of hemodynamically-significant stenosis involving either carotid bifurcation.  Codominant vertebral arteries without significant stenosis of proximal or mid aspect.  The complete course of the vertebral arteries at the level of C1-2 and skull base was not included on the present exam.
IMPRESSION: 1.  No evidence of hemodynamically-significant stenosis involving either carotid bifurcation.  
2.  Portion of the distal vertebral artery is not included on present exam as noted above.

## 2007-09-04 IMAGING — CR DG CHEST 1V PORT
1 series · 1 of 1 positions shown · non-contrast
Comparison: 07/05/2006

CLINICAL DATA: Left arm numbness.
 PORTABLE CHEST:

[view not recorded]
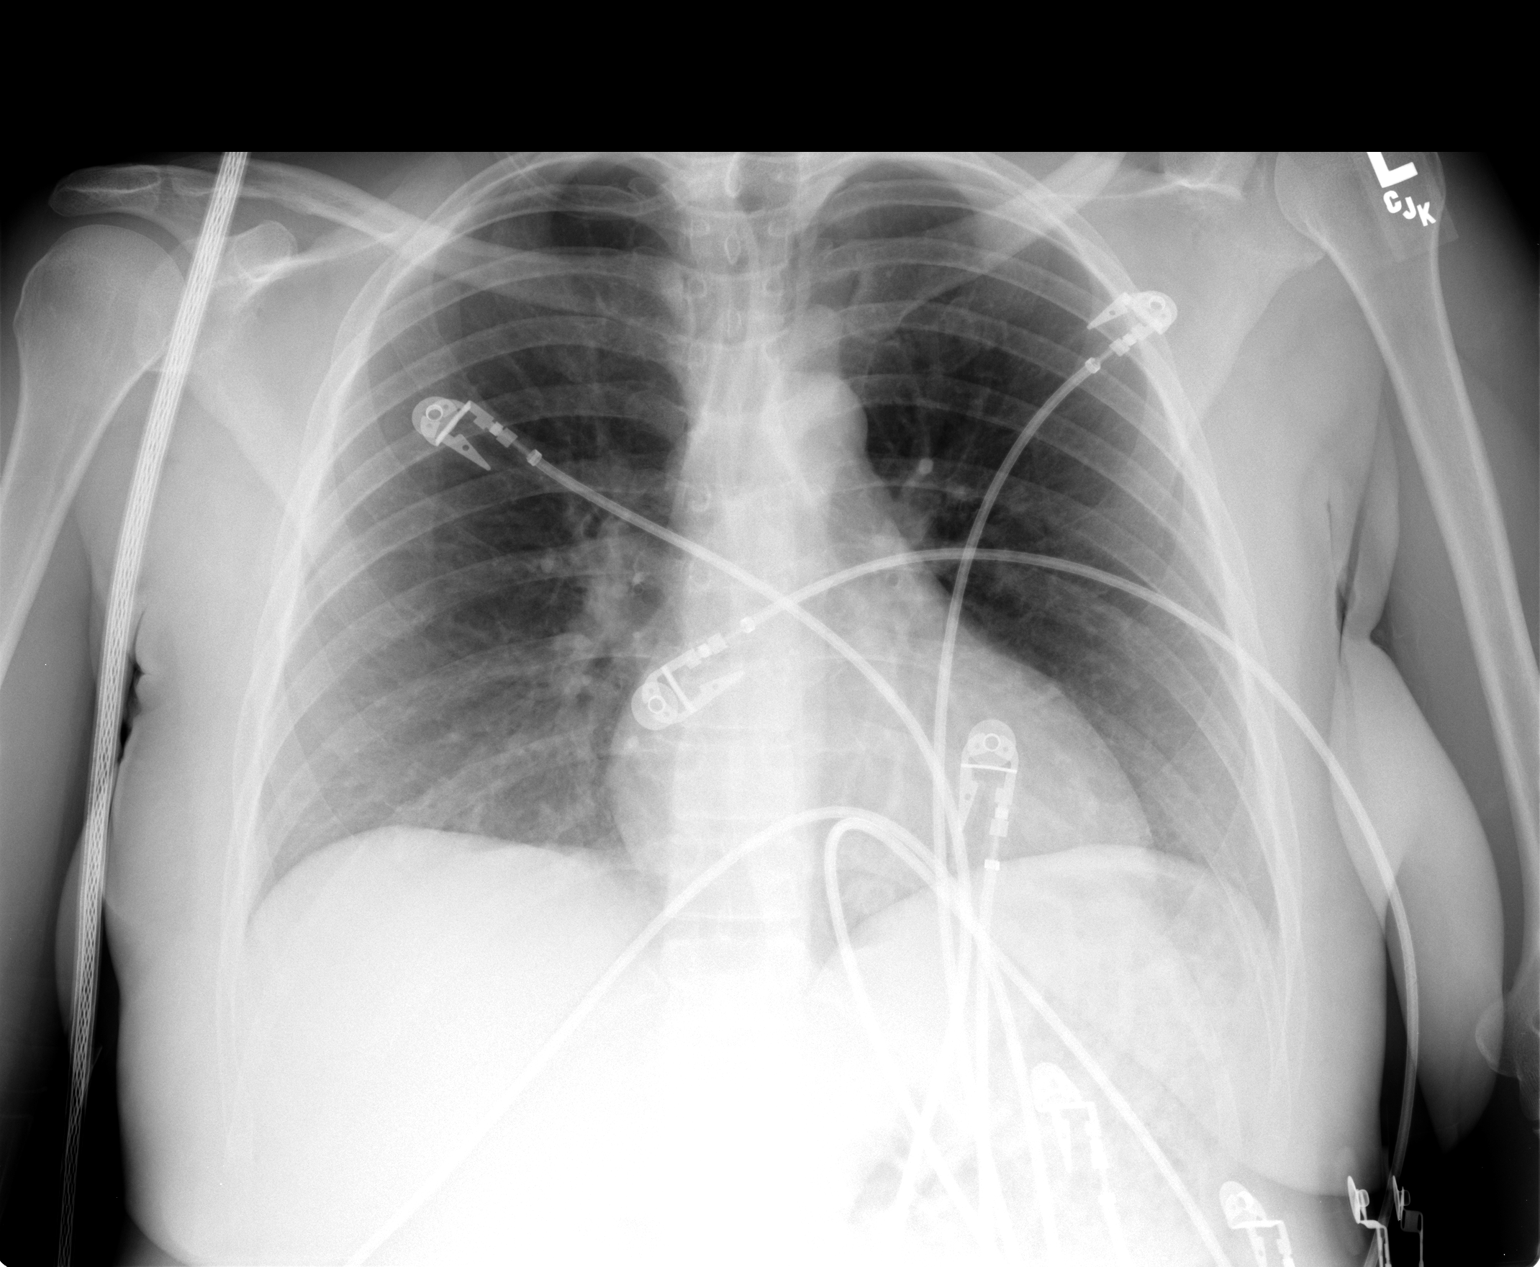

[1 of 1 positions shown; findings below may reference images not displayed]

FINDINGS: Cardiomediastinal silhouette is unremarkable. Lungs are clear. There are no pleural effusions or pneumothorax identified.
IMPRESSION: No evidence of acute cardiopulmonary disease.

## 2007-09-05 IMAGING — CR DG CHEST 2V
2 series · 2 of 2 positions shown · non-contrast
Comparison: Portable chest x-ray yesterday and 07/05/2006.

CLINICAL DATA: Possible meningitis. TIA.

CHEST - 2 VIEW  02/03/2007:

[w chest pa]
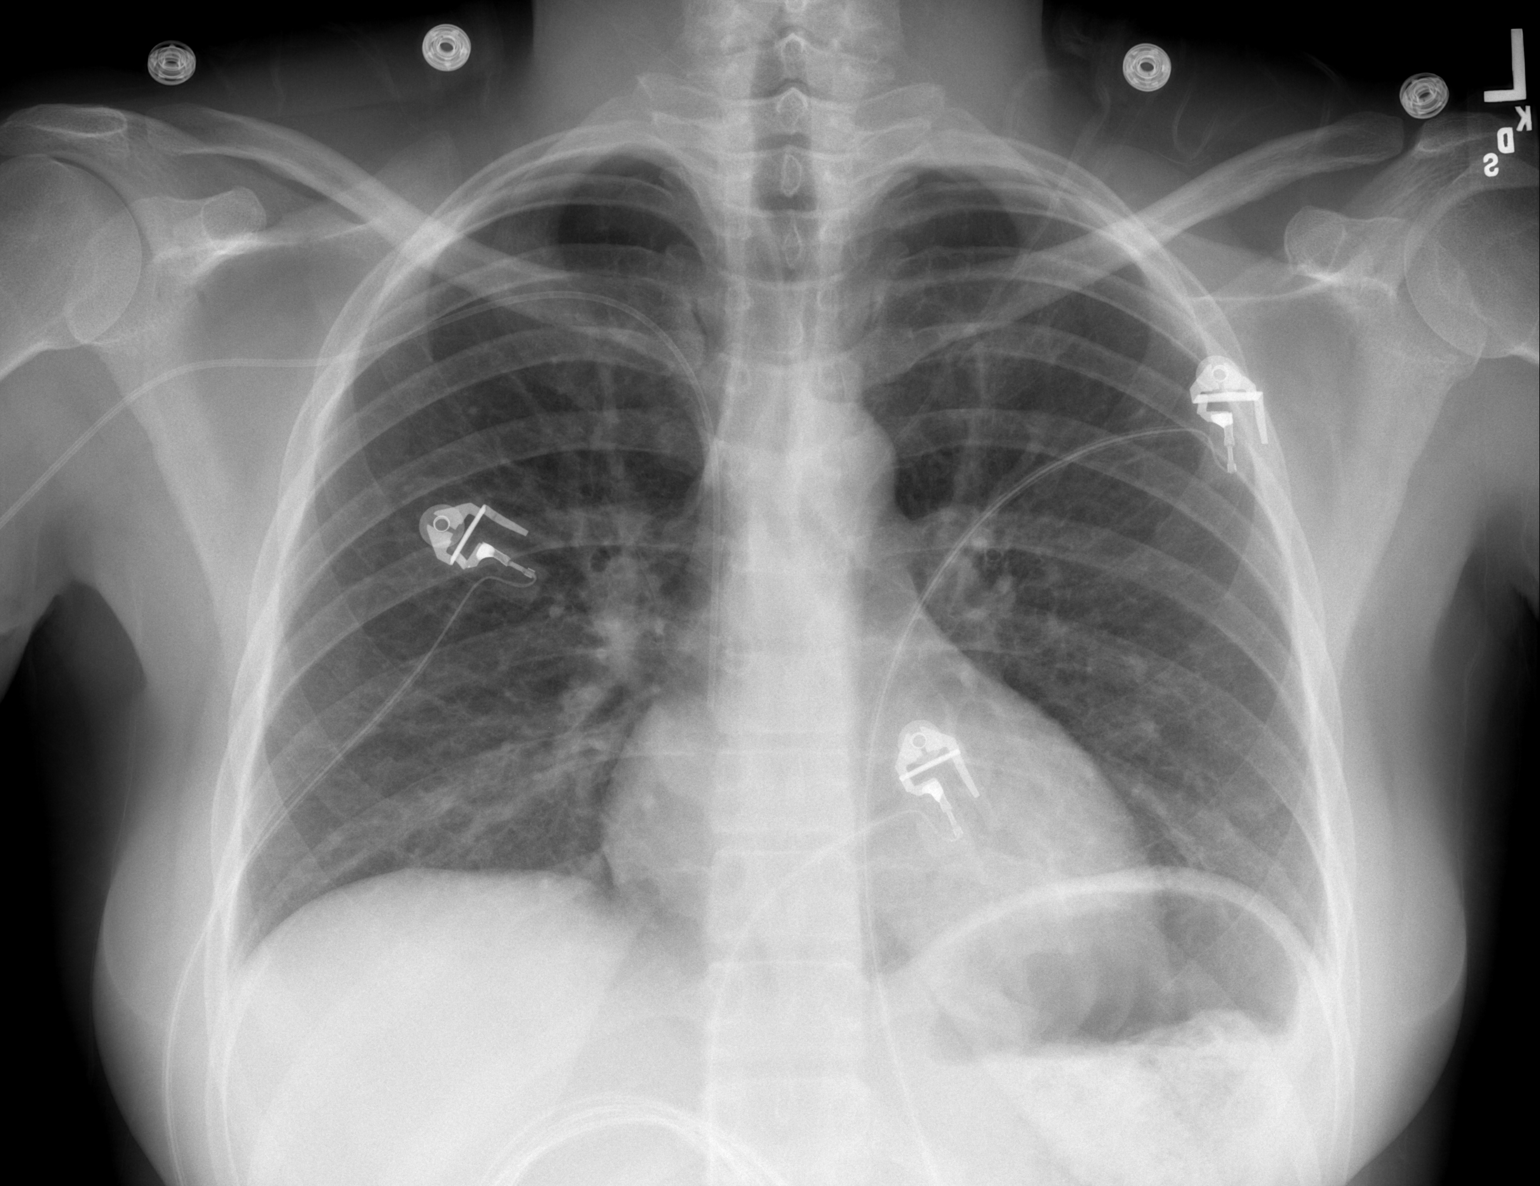

[w chest lat]
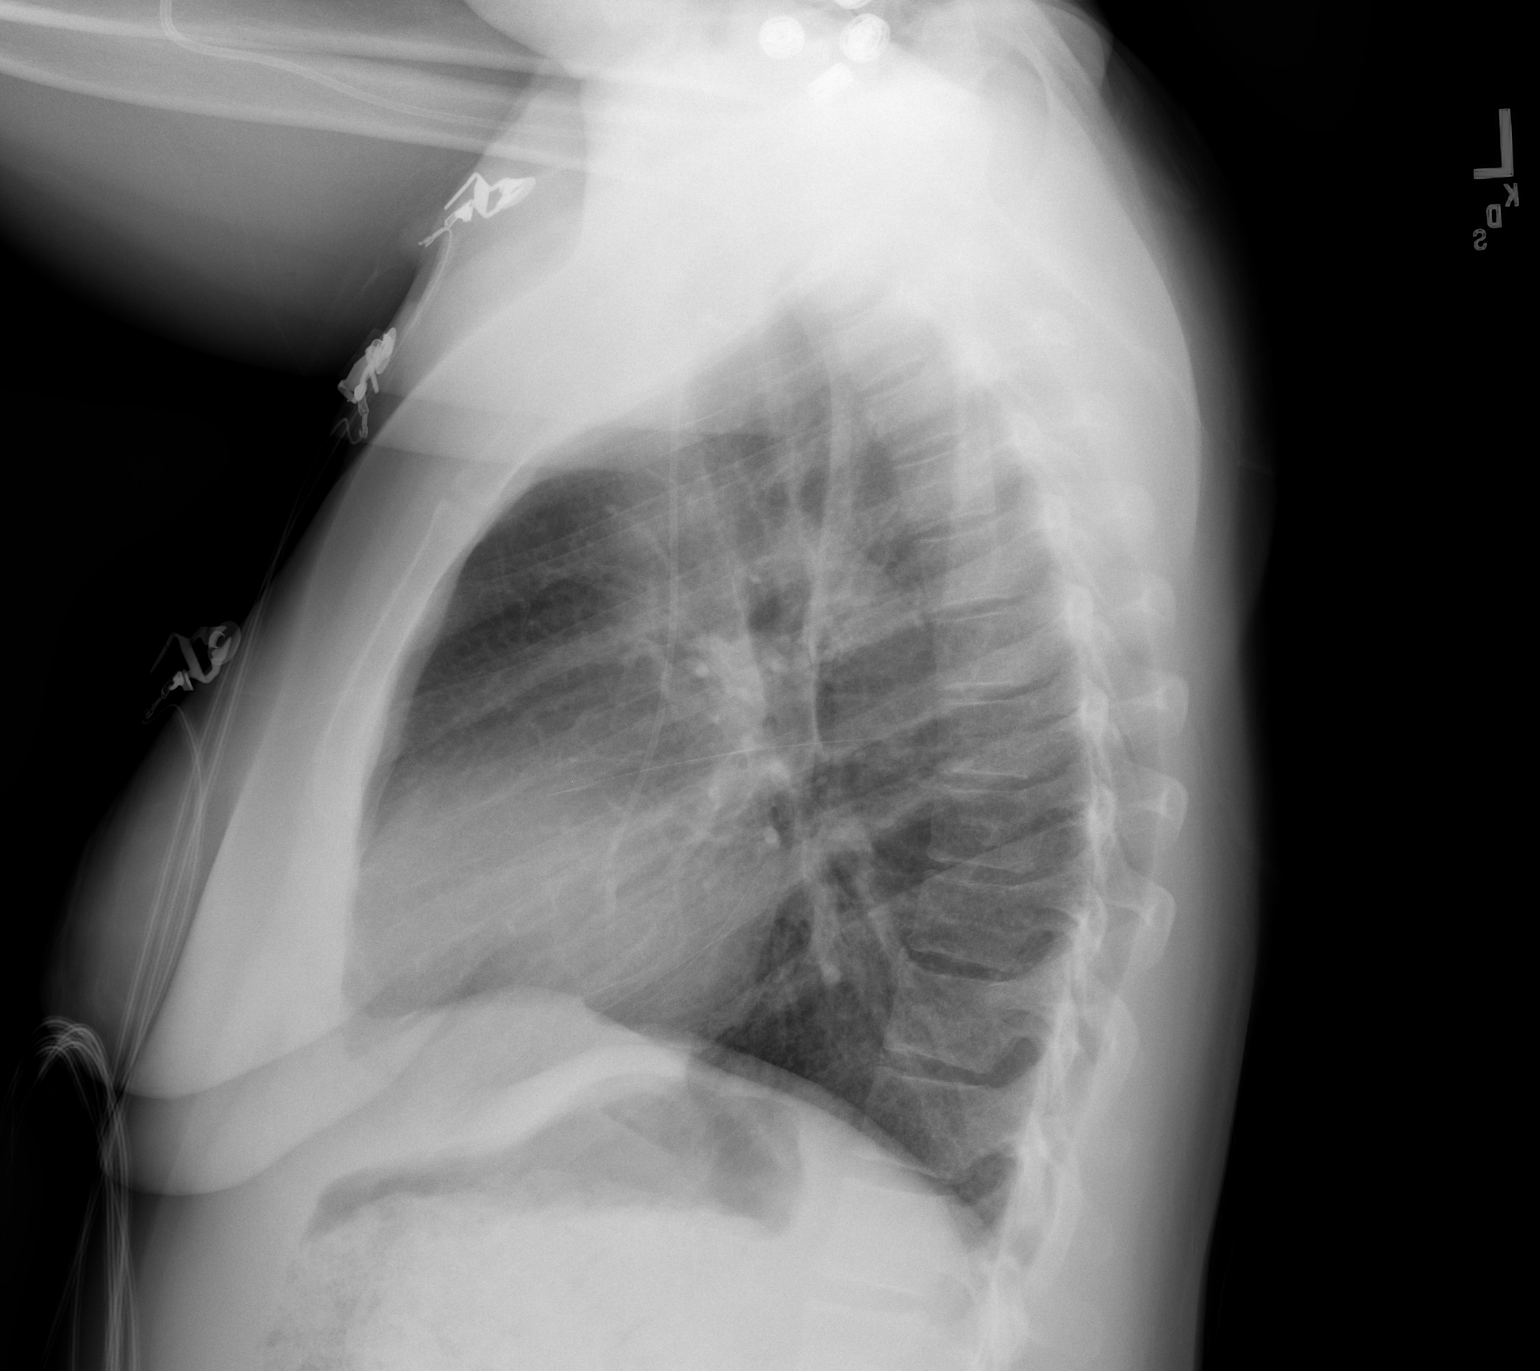

[2 of 2 positions shown; findings below may reference images not displayed]

FINDINGS: Suboptimal inspiration on the current examination accounting for the
crowded bronchovascular markings in the bases, also accentuating the heart size.
Taking this into account, heart size normal and lungs clear. No pleural
effusions. Right arm PICC tip at the cavoatrial junction, unchanged. Visualized
bony thorax intact.
IMPRESSION: Suboptimal inspiration. No acute cardiopulmonary disease.

## 2007-09-16 ENCOUNTER — Telehealth (INDEPENDENT_AMBULATORY_CARE_PROVIDER_SITE_OTHER): Payer: Self-pay | Admitting: Family Medicine

## 2007-11-20 ENCOUNTER — Telehealth (INDEPENDENT_AMBULATORY_CARE_PROVIDER_SITE_OTHER): Payer: Self-pay | Admitting: *Deleted

## 2007-12-02 ENCOUNTER — Ambulatory Visit: Payer: Self-pay | Admitting: Family Medicine

## 2007-12-03 ENCOUNTER — Telehealth (INDEPENDENT_AMBULATORY_CARE_PROVIDER_SITE_OTHER): Payer: Self-pay | Admitting: Family Medicine

## 2007-12-16 ENCOUNTER — Ambulatory Visit: Payer: Self-pay | Admitting: Family Medicine

## 2008-01-08 IMAGING — CR DG KNEE COMPLETE 4+V*L*
4 series · 4 of 4 positions shown · non-contrast
Comparison: none

CLINICAL DATA: Severe knee pain.  No known trauma. 
 LEFT KNEE ? 4 VIEW:

[t knee ap left]
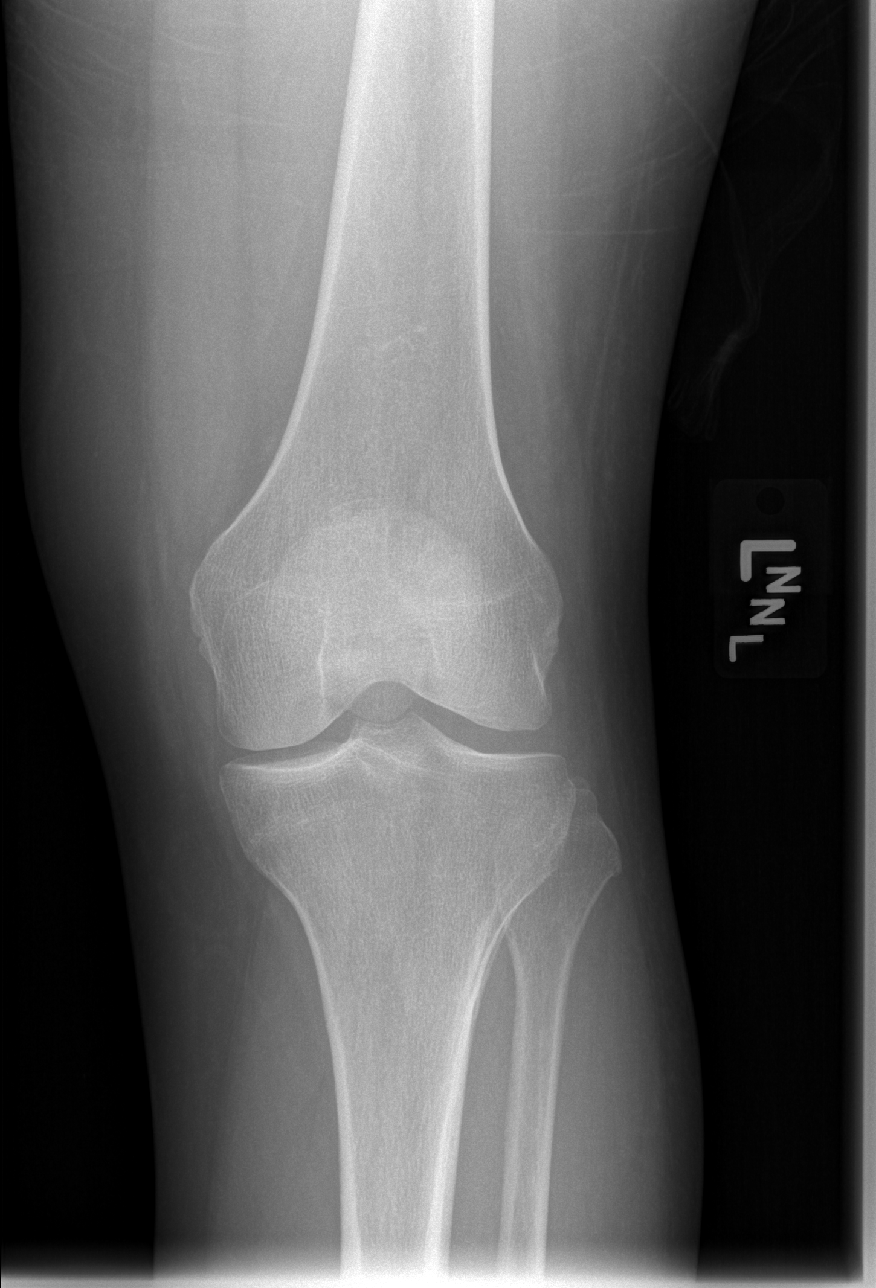

[t knee oblique left (1 of 2)]
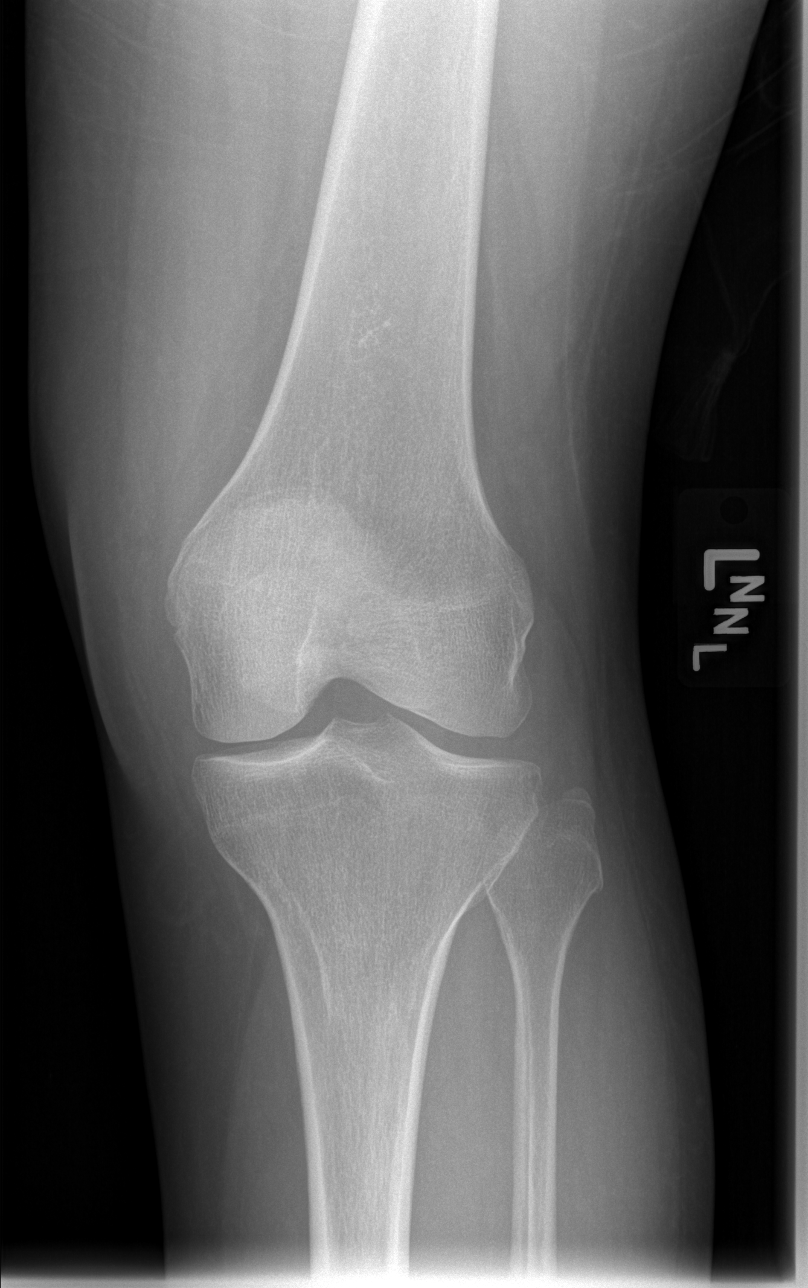

[t knee oblique left (2 of 2)]
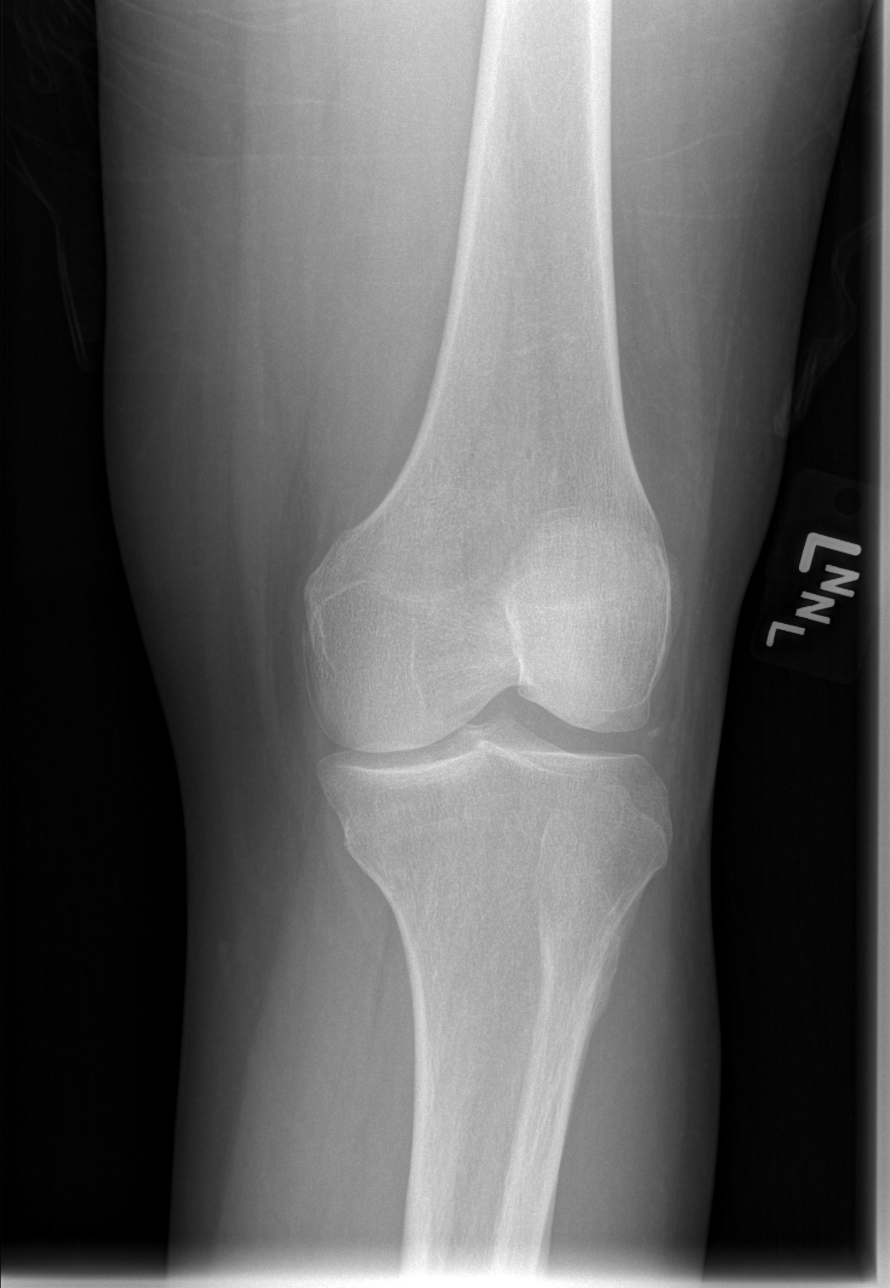

[t knee lat left]
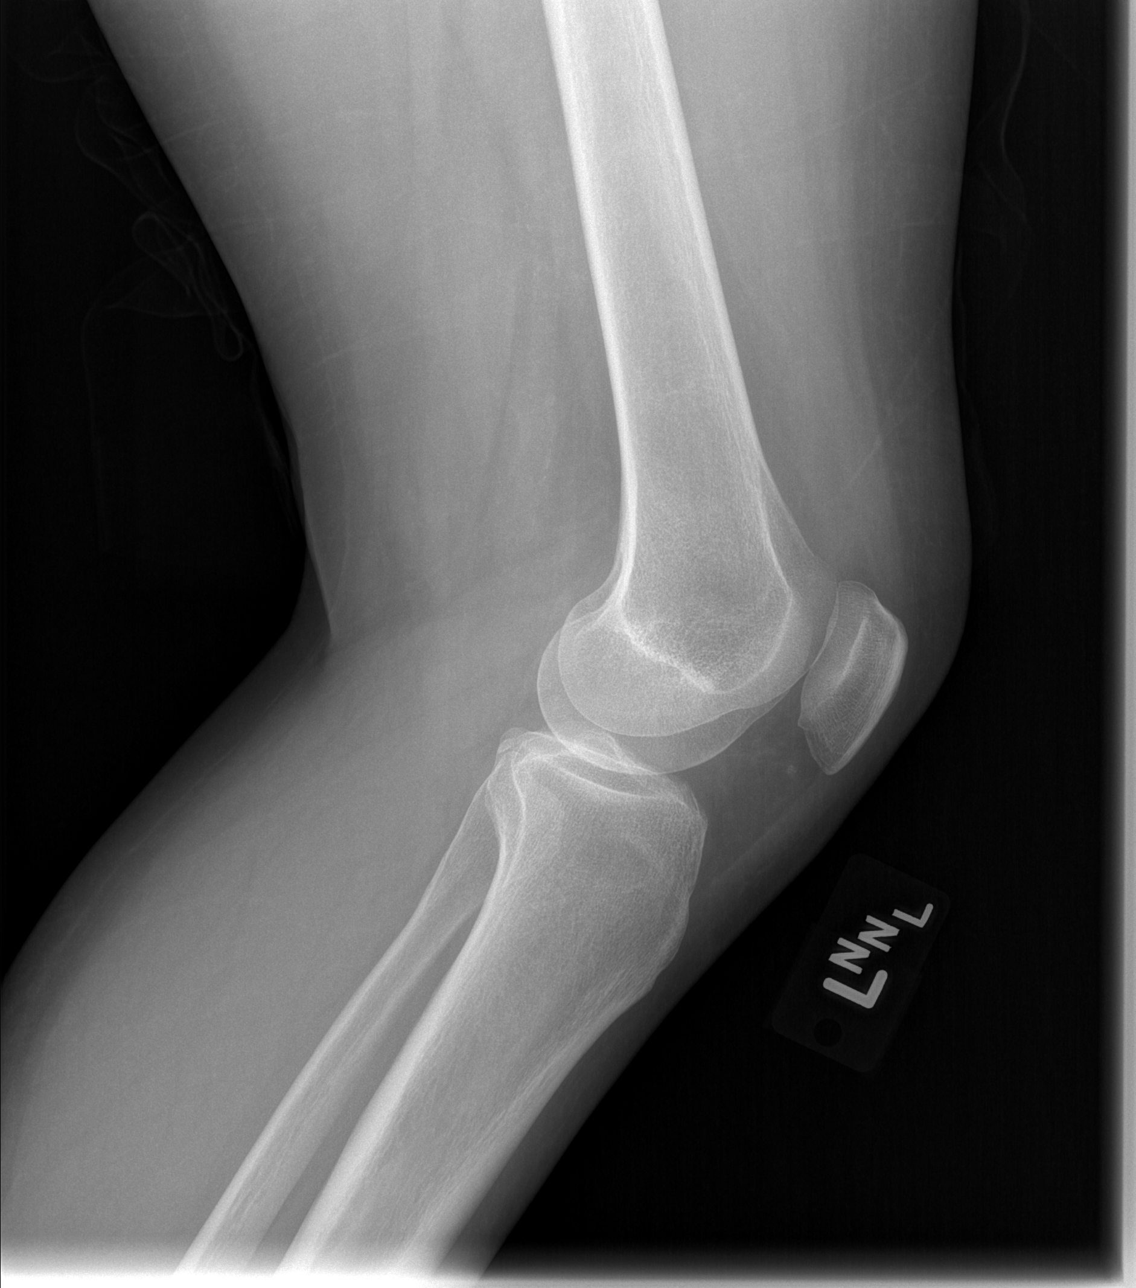

[4 of 4 positions shown; findings below may reference images not displayed]

FINDINGS: There is no evidence of fracture, dislocation, or joint effusion.  There is no evidence of arthropathy or other focal bone abnormality.  Soft tissues are unremarkable.
IMPRESSION: Negative.

## 2008-01-14 LAB — CONVERTED CEMR LAB
Alkaline Phosphatase: 148 units/L — ABNORMAL HIGH (ref 39–117)
CO2: 19 meq/L (ref 19–32)
Cholesterol: 233 mg/dL — ABNORMAL HIGH (ref 0–200)
Creatinine, Ser: 1.33 mg/dL — ABNORMAL HIGH (ref 0.40–1.20)
Eosinophils Absolute: 0.1 10*3/uL (ref 0.0–0.7)
Eosinophils Relative: 1 % (ref 0–5)
Glucose, Bld: 294 mg/dL — ABNORMAL HIGH (ref 70–99)
HCT: 39.3 % (ref 36.0–46.0)
HDL: 74 mg/dL (ref >39)
Hemoglobin: 13 g/dL (ref 12.0–15.0)
LDL Cholesterol: 141 mg/dL — ABNORMAL HIGH (ref 0–99)
Lymphs Abs: 1.6 10*3/uL (ref 0.7–4.0)
MCV: 88.5 fL (ref 78.0–100.0)
Monocytes Absolute: 0.6 10*3/uL (ref 0.1–1.0)
Platelets: 324 10*3/uL (ref 150–400)
RDW: 14.2 % (ref 11.5–15.5)
Sodium: 134 meq/L — ABNORMAL LOW (ref 135–145)
Total Bilirubin: 0.4 mg/dL (ref 0.3–1.2)
Total CHOL/HDL Ratio: 3.1
Total Protein: 8.1 g/dL (ref 6.0–8.3)
Triglycerides: 91 mg/dL (ref <150)
VLDL: 18 mg/dL (ref 0–40)
WBC: 9.8 10*3/uL (ref 4.0–10.5)

## 2008-01-24 IMAGING — NM NM BONE 3 PHASE
2 series · 12 of 12 positions shown · non-contrast
Comparison: None.

CLINICAL DATA: Diabetic with right foot pain.  Evaluate for osteomyelitis.
 NUCLEAR MEDICINE 3-PHASE BONE SCAN:
TECHNIQUE: Radionuclide angiographic, immediate static, and 3-hour delayed images were obtained after intravenous injection of radiopharmaceutical.
 Radiopharmaceutical:  22.1 mCi Dc-22m MDP.

[Series 1: bf bone flow · 9.39mm/px · 6 of 48 frames shown (1 of 2)]
[frame 5/48]
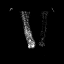
[frame 13/48]
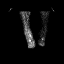
[frame 21/48]
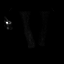
[frame 29/48]
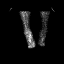
[frame 37/48]
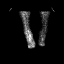
[frame 45/48]
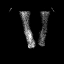

[Series 1: bf bone flow · 9.39mm/px · 6 of 48 frames shown (2 of 2)]
[frame 5/48]
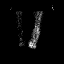
[frame 13/48]
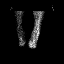
[frame 21/48]
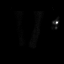
[frame 29/48]
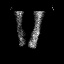
[frame 37/48]
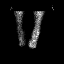
[frame 45/48]
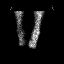

[12 of 12 positions shown; findings below may reference images not displayed]

FINDINGS: In the vascular phase, there is mild hyperemia of the right lower extremity compared to the left.  In the blood pool images, there is diffuse increase uptake in the right foot and to some degree in the ankle, compared to the left.  
 On the delayed static images, there is focal increase in uptake at the dorsum of the foot, near the midline anteriorly, at the tarsal metatarsal junction.  The findings are consistent with osteomyelitis.  Recommend careful clinical correlation.  Consider MRI if further assessment is needed clinically.
IMPRESSION: The study is abnormal with findings suspicious for osteomyelitis of the dorsum of the right foot as described above.

## 2008-02-11 ENCOUNTER — Ambulatory Visit: Payer: Self-pay | Admitting: Family Medicine

## 2008-02-12 ENCOUNTER — Encounter (INDEPENDENT_AMBULATORY_CARE_PROVIDER_SITE_OTHER): Payer: Self-pay | Admitting: Family Medicine

## 2008-02-12 LAB — CONVERTED CEMR LAB
AST: 45 units/L — ABNORMAL HIGH (ref 0–37)
Albumin: 3.6 g/dL (ref 3.5–5.2)
BUN: 33 mg/dL — ABNORMAL HIGH (ref 6–23)
CO2: 20 meq/L (ref 19–32)
Calcium: 9.2 mg/dL (ref 8.4–10.5)
Chloride: 104 meq/L (ref 96–112)
Creatinine, Ser: 1.08 mg/dL (ref 0.40–1.20)
Glucose, Bld: 100 mg/dL — ABNORMAL HIGH (ref 70–99)
Potassium: 4.9 meq/L (ref 3.5–5.3)

## 2008-03-27 ENCOUNTER — Telehealth (INDEPENDENT_AMBULATORY_CARE_PROVIDER_SITE_OTHER): Payer: Self-pay | Admitting: *Deleted

## 2008-03-31 ENCOUNTER — Telehealth (INDEPENDENT_AMBULATORY_CARE_PROVIDER_SITE_OTHER): Payer: Self-pay | Admitting: Family Medicine

## 2008-04-17 ENCOUNTER — Telehealth (INDEPENDENT_AMBULATORY_CARE_PROVIDER_SITE_OTHER): Payer: Self-pay | Admitting: *Deleted

## 2008-04-20 ENCOUNTER — Telehealth (INDEPENDENT_AMBULATORY_CARE_PROVIDER_SITE_OTHER): Payer: Self-pay | Admitting: *Deleted

## 2008-05-05 ENCOUNTER — Ambulatory Visit: Payer: Self-pay | Admitting: Family Medicine

## 2008-05-05 DIAGNOSIS — A088 Other specified intestinal infections: Secondary | ICD-10-CM | POA: Insufficient documentation

## 2008-05-05 LAB — CONVERTED CEMR LAB: Blood Glucose, Fingerstick: 306

## 2008-05-13 ENCOUNTER — Encounter (INDEPENDENT_AMBULATORY_CARE_PROVIDER_SITE_OTHER): Payer: Self-pay | Admitting: Family Medicine

## 2008-06-22 ENCOUNTER — Encounter (INDEPENDENT_AMBULATORY_CARE_PROVIDER_SITE_OTHER): Payer: Self-pay | Admitting: Family Medicine

## 2008-06-29 ENCOUNTER — Telehealth (INDEPENDENT_AMBULATORY_CARE_PROVIDER_SITE_OTHER): Payer: Self-pay | Admitting: Family Medicine

## 2008-07-01 ENCOUNTER — Encounter (INDEPENDENT_AMBULATORY_CARE_PROVIDER_SITE_OTHER): Payer: Self-pay | Admitting: Family Medicine

## 2008-07-21 ENCOUNTER — Ambulatory Visit: Payer: Self-pay | Admitting: Family Medicine

## 2008-07-21 LAB — CONVERTED CEMR LAB
ALT: 17 units/L (ref 0–35)
Albumin: 3.7 g/dL (ref 3.5–5.2)
Alkaline Phosphatase: 107 units/L (ref 39–117)
Basophils Absolute: 0 10*3/uL (ref 0.0–0.1)
Blood Glucose, Fingerstick: 324
CO2: 18 meq/L — ABNORMAL LOW (ref 19–32)
Eosinophils Absolute: 0.1 10*3/uL (ref 0.0–0.7)
Eosinophils Relative: 1 % (ref 0–5)
HCT: 36.3 % (ref 36.0–46.0)
LDL Cholesterol: 152 mg/dL — ABNORMAL HIGH (ref 0–99)
Lymphocytes Relative: 24 % (ref 12–46)
Lymphs Abs: 2.3 10*3/uL (ref 0.7–4.0)
Neutrophils Relative %: 68 % (ref 43–77)
Platelets: 313 10*3/uL (ref 150–400)
Potassium: 4.9 meq/L (ref 3.5–5.3)
RDW: 14.7 % (ref 11.5–15.5)
Sodium: 135 meq/L (ref 135–145)
Total Bilirubin: 0.3 mg/dL (ref 0.3–1.2)
Total Protein: 7.2 g/dL (ref 6.0–8.3)
VLDL: 29 mg/dL (ref 0–40)
WBC: 9.8 10*3/uL (ref 4.0–10.5)

## 2008-07-27 ENCOUNTER — Telehealth (INDEPENDENT_AMBULATORY_CARE_PROVIDER_SITE_OTHER): Payer: Self-pay | Admitting: *Deleted

## 2008-08-11 ENCOUNTER — Telehealth (INDEPENDENT_AMBULATORY_CARE_PROVIDER_SITE_OTHER): Payer: Self-pay | Admitting: Family Medicine

## 2008-08-13 ENCOUNTER — Ambulatory Visit (HOSPITAL_COMMUNITY): Admission: RE | Admit: 2008-08-13 | Discharge: 2008-08-13 | Payer: Self-pay | Admitting: Family Medicine

## 2008-08-19 ENCOUNTER — Encounter: Admission: RE | Admit: 2008-08-19 | Discharge: 2008-08-19 | Payer: Self-pay | Admitting: Family Medicine

## 2008-08-27 ENCOUNTER — Encounter (INDEPENDENT_AMBULATORY_CARE_PROVIDER_SITE_OTHER): Payer: Self-pay | Admitting: Family Medicine

## 2008-09-18 ENCOUNTER — Encounter (INDEPENDENT_AMBULATORY_CARE_PROVIDER_SITE_OTHER): Payer: Self-pay | Admitting: Family Medicine

## 2008-09-21 ENCOUNTER — Ambulatory Visit: Payer: Self-pay | Admitting: Family Medicine

## 2008-09-21 ENCOUNTER — Telehealth (INDEPENDENT_AMBULATORY_CARE_PROVIDER_SITE_OTHER): Payer: Self-pay | Admitting: Family Medicine

## 2008-09-22 ENCOUNTER — Encounter (INDEPENDENT_AMBULATORY_CARE_PROVIDER_SITE_OTHER): Payer: Self-pay | Admitting: Family Medicine

## 2008-10-02 ENCOUNTER — Encounter (INDEPENDENT_AMBULATORY_CARE_PROVIDER_SITE_OTHER): Payer: Self-pay | Admitting: Family Medicine

## 2008-10-10 ENCOUNTER — Emergency Department (HOSPITAL_COMMUNITY): Admission: EM | Admit: 2008-10-10 | Discharge: 2008-10-10 | Payer: Self-pay | Admitting: Emergency Medicine

## 2008-10-12 ENCOUNTER — Telehealth (INDEPENDENT_AMBULATORY_CARE_PROVIDER_SITE_OTHER): Payer: Self-pay | Admitting: *Deleted

## 2008-11-19 ENCOUNTER — Ambulatory Visit: Payer: Self-pay | Admitting: Family Medicine

## 2008-11-19 LAB — CONVERTED CEMR LAB
ALT: <8 units/L (ref 0–35)
AST: 12 units/L (ref 0–37)
Albumin: 3.6 g/dL (ref 3.5–5.2)
Alkaline Phosphatase: 132 units/L — ABNORMAL HIGH (ref 39–117)
Chloride: 103 meq/L (ref 96–112)
LDL Cholesterol: 79 mg/dL (ref 0–99)
Potassium: 4.8 meq/L (ref 3.5–5.3)
Sodium: 135 meq/L (ref 135–145)
Total Protein: 7.8 g/dL (ref 6.0–8.3)

## 2008-12-01 ENCOUNTER — Emergency Department (HOSPITAL_COMMUNITY): Admission: EM | Admit: 2008-12-01 | Discharge: 2008-12-02 | Payer: Self-pay | Admitting: Emergency Medicine

## 2008-12-02 ENCOUNTER — Telehealth (INDEPENDENT_AMBULATORY_CARE_PROVIDER_SITE_OTHER): Payer: Self-pay | Admitting: Family Medicine

## 2008-12-06 ENCOUNTER — Inpatient Hospital Stay (HOSPITAL_COMMUNITY): Admission: EM | Admit: 2008-12-06 | Discharge: 2008-12-07 | Payer: Self-pay | Admitting: Emergency Medicine

## 2008-12-07 ENCOUNTER — Encounter (INDEPENDENT_AMBULATORY_CARE_PROVIDER_SITE_OTHER): Payer: Self-pay | Admitting: *Deleted

## 2008-12-07 ENCOUNTER — Encounter (INDEPENDENT_AMBULATORY_CARE_PROVIDER_SITE_OTHER): Payer: Self-pay | Admitting: Family Medicine

## 2008-12-09 ENCOUNTER — Encounter (INDEPENDENT_AMBULATORY_CARE_PROVIDER_SITE_OTHER): Payer: Self-pay | Admitting: Family Medicine

## 2008-12-10 ENCOUNTER — Telehealth (INDEPENDENT_AMBULATORY_CARE_PROVIDER_SITE_OTHER): Payer: Self-pay | Admitting: Family Medicine

## 2008-12-11 ENCOUNTER — Emergency Department (HOSPITAL_COMMUNITY): Admission: EM | Admit: 2008-12-11 | Discharge: 2008-12-11 | Payer: Self-pay | Admitting: Emergency Medicine

## 2008-12-15 ENCOUNTER — Ambulatory Visit: Payer: Self-pay | Admitting: Family Medicine

## 2008-12-15 DIAGNOSIS — J40 Bronchitis, not specified as acute or chronic: Secondary | ICD-10-CM | POA: Insufficient documentation

## 2008-12-15 DIAGNOSIS — M069 Rheumatoid arthritis, unspecified: Secondary | ICD-10-CM

## 2008-12-15 DIAGNOSIS — M2550 Pain in unspecified joint: Secondary | ICD-10-CM | POA: Insufficient documentation

## 2008-12-15 HISTORY — DX: Rheumatoid arthritis, unspecified: M06.9

## 2008-12-15 LAB — CONVERTED CEMR LAB
Blood Glucose, Fingerstick: 98
Chlamydia, Swab/Urine, PCR: NEGATIVE
GC Probe Amp, Urine: NEGATIVE
Glucose, Urine, Semiquant: NEGATIVE
Nitrite: NEGATIVE
Specific Gravity, Urine: 1.02

## 2008-12-22 ENCOUNTER — Telehealth (INDEPENDENT_AMBULATORY_CARE_PROVIDER_SITE_OTHER): Payer: Self-pay | Admitting: Family Medicine

## 2008-12-25 ENCOUNTER — Encounter (INDEPENDENT_AMBULATORY_CARE_PROVIDER_SITE_OTHER): Payer: Self-pay | Admitting: Family Medicine

## 2008-12-30 ENCOUNTER — Ambulatory Visit: Payer: Self-pay | Admitting: Family Medicine

## 2008-12-30 LAB — CONVERTED CEMR LAB: Hgb A1c MFr Bld: 8.6 %

## 2009-01-13 ENCOUNTER — Observation Stay (HOSPITAL_COMMUNITY): Admission: EM | Admit: 2009-01-13 | Discharge: 2009-01-14 | Payer: Self-pay | Admitting: Emergency Medicine

## 2009-01-13 ENCOUNTER — Ambulatory Visit: Payer: Self-pay | Admitting: *Deleted

## 2009-01-14 ENCOUNTER — Encounter: Payer: Self-pay | Admitting: Internal Medicine

## 2009-01-20 ENCOUNTER — Ambulatory Visit: Payer: Self-pay | Admitting: Cardiology

## 2009-01-20 ENCOUNTER — Inpatient Hospital Stay (HOSPITAL_COMMUNITY): Admission: EM | Admit: 2009-01-20 | Discharge: 2009-01-29 | Payer: Self-pay | Admitting: Emergency Medicine

## 2009-01-20 DIAGNOSIS — I2119 ST elevation (STEMI) myocardial infarction involving other coronary artery of inferior wall: Secondary | ICD-10-CM

## 2009-01-20 HISTORY — DX: ST elevation (STEMI) myocardial infarction involving other coronary artery of inferior wall: I21.19

## 2009-01-20 HISTORY — PX: CORONARY ANGIOPLASTY WITH STENT PLACEMENT: SHX49

## 2009-01-29 ENCOUNTER — Encounter (INDEPENDENT_AMBULATORY_CARE_PROVIDER_SITE_OTHER): Payer: Self-pay | Admitting: Nurse Practitioner

## 2009-02-02 ENCOUNTER — Encounter (INDEPENDENT_AMBULATORY_CARE_PROVIDER_SITE_OTHER): Payer: Self-pay | Admitting: Nurse Practitioner

## 2009-02-02 ENCOUNTER — Encounter: Admission: RE | Admit: 2009-02-02 | Discharge: 2009-02-02 | Payer: Self-pay | Admitting: Family Medicine

## 2009-02-08 ENCOUNTER — Encounter (INDEPENDENT_AMBULATORY_CARE_PROVIDER_SITE_OTHER): Payer: Self-pay | Admitting: Nurse Practitioner

## 2009-02-09 ENCOUNTER — Encounter (INDEPENDENT_AMBULATORY_CARE_PROVIDER_SITE_OTHER): Payer: Self-pay | Admitting: Nurse Practitioner

## 2009-02-12 ENCOUNTER — Emergency Department (HOSPITAL_COMMUNITY): Admission: EM | Admit: 2009-02-12 | Discharge: 2009-02-12 | Payer: Self-pay | Admitting: Emergency Medicine

## 2009-02-15 ENCOUNTER — Telehealth: Payer: Self-pay | Admitting: Internal Medicine

## 2009-02-15 ENCOUNTER — Telehealth (INDEPENDENT_AMBULATORY_CARE_PROVIDER_SITE_OTHER): Payer: Self-pay | Admitting: Internal Medicine

## 2009-02-18 ENCOUNTER — Ambulatory Visit: Payer: Self-pay | Admitting: Internal Medicine

## 2009-02-18 DIAGNOSIS — M069 Rheumatoid arthritis, unspecified: Secondary | ICD-10-CM

## 2009-02-25 ENCOUNTER — Encounter (HOSPITAL_COMMUNITY): Admission: RE | Admit: 2009-02-25 | Discharge: 2009-05-26 | Payer: Self-pay | Admitting: Interventional Cardiology

## 2009-03-04 ENCOUNTER — Telehealth (INDEPENDENT_AMBULATORY_CARE_PROVIDER_SITE_OTHER): Payer: Self-pay | Admitting: Internal Medicine

## 2009-03-12 ENCOUNTER — Encounter (INDEPENDENT_AMBULATORY_CARE_PROVIDER_SITE_OTHER): Payer: Self-pay | Admitting: Internal Medicine

## 2009-03-19 ENCOUNTER — Ambulatory Visit: Payer: Self-pay | Admitting: Internal Medicine

## 2009-04-06 ENCOUNTER — Ambulatory Visit: Payer: Self-pay | Admitting: Internal Medicine

## 2009-04-06 LAB — CONVERTED CEMR LAB: Hgb A1c MFr Bld: 10.1 %

## 2009-04-13 ENCOUNTER — Telehealth (INDEPENDENT_AMBULATORY_CARE_PROVIDER_SITE_OTHER): Payer: Self-pay | Admitting: Internal Medicine

## 2009-05-04 ENCOUNTER — Ambulatory Visit: Payer: Self-pay | Admitting: Internal Medicine

## 2009-05-12 IMAGING — CR DG SHOULDER 2+V*R*
4 series · 4 of 4 positions shown · non-contrast
Comparison: None

CLINICAL DATA: Trauma

RIGHT SHOULDER - 2+ VIEW

[w shoulder ap internal righ]
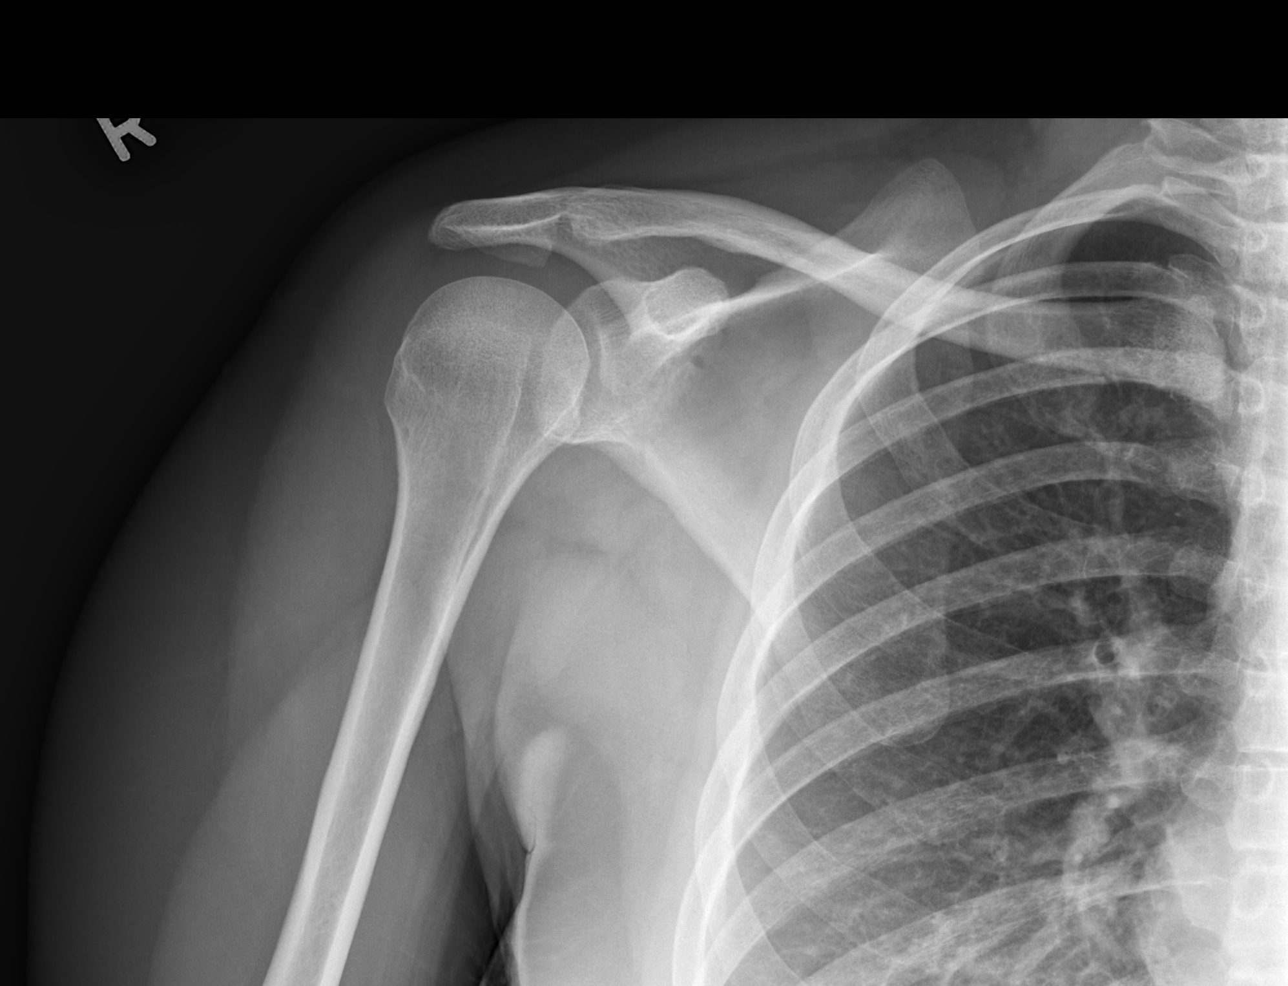

[w shoulder ap external righ]
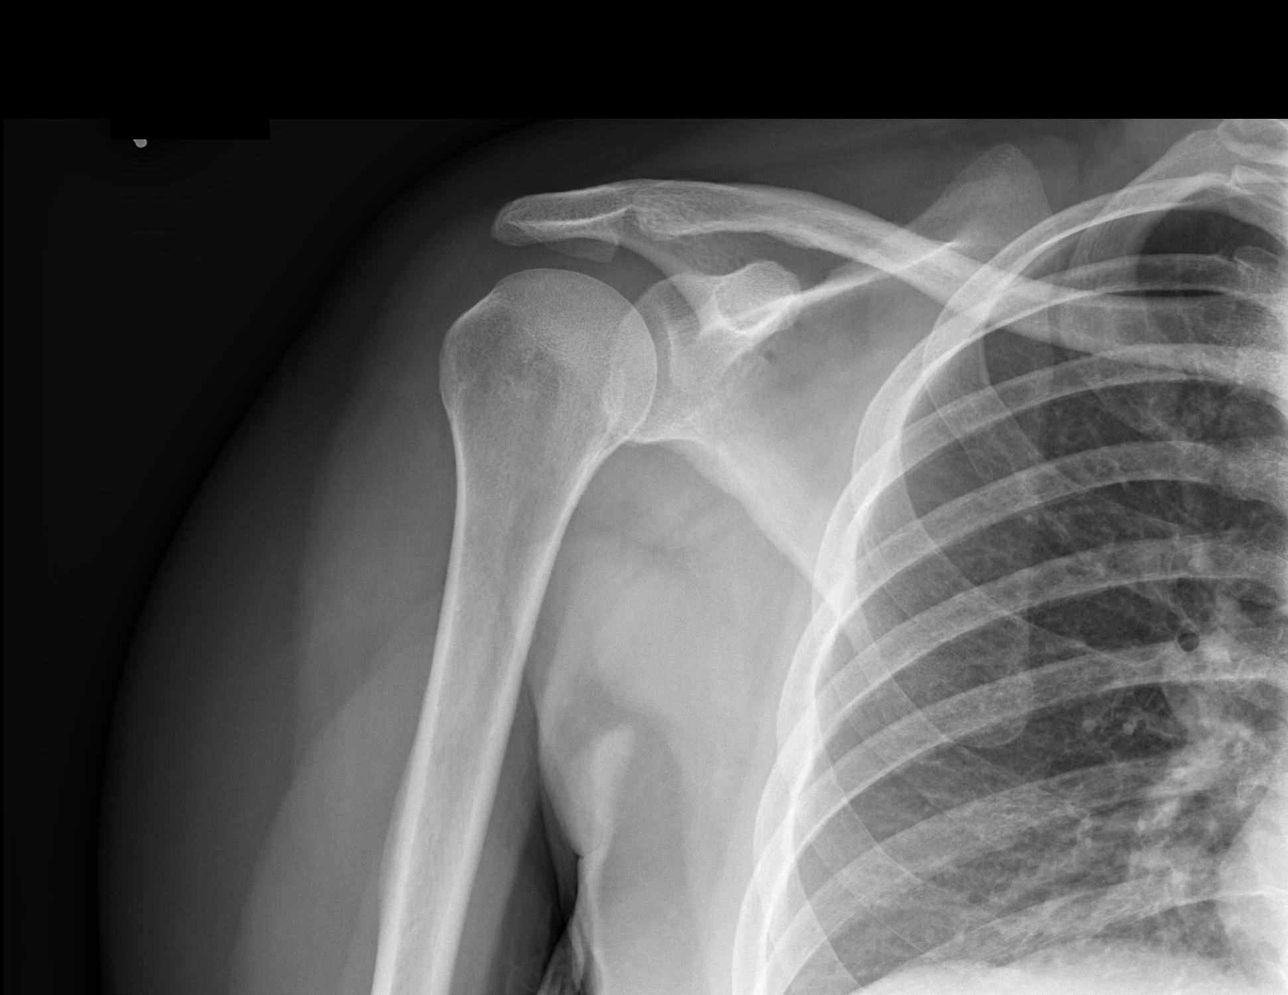

[w shoulder y view right (1 of 2)]
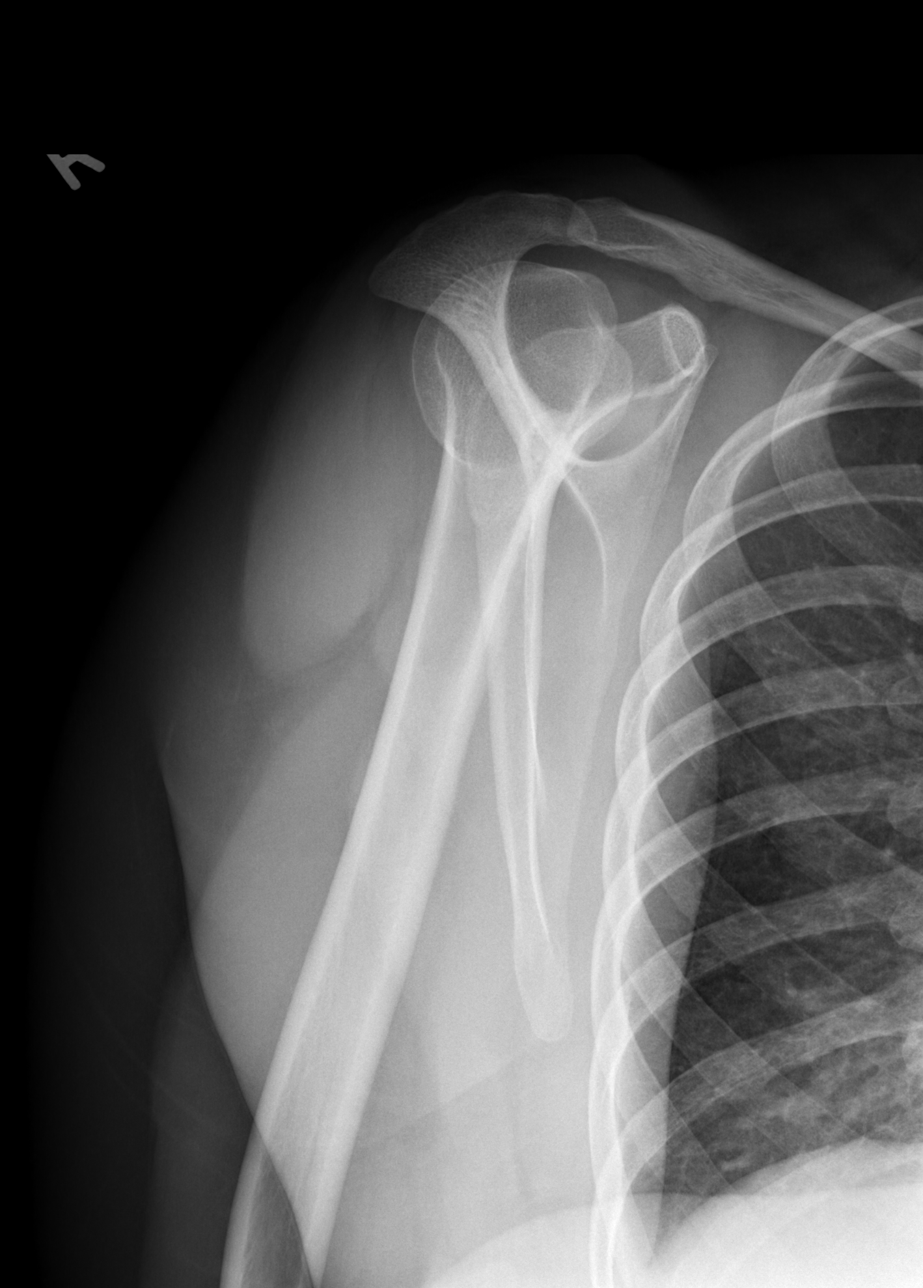

[w shoulder y view right (2 of 2)]
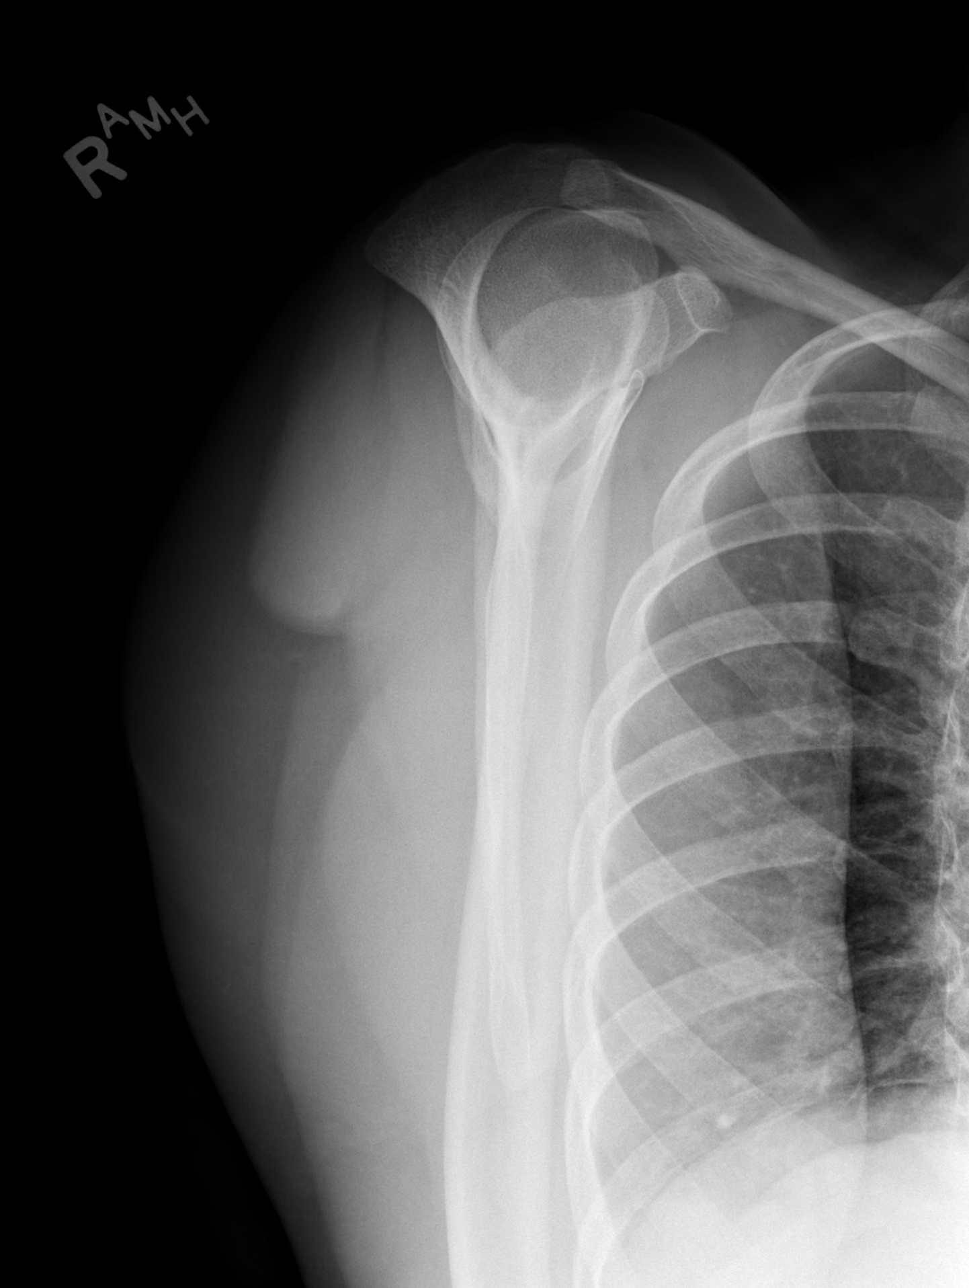

[4 of 4 positions shown; findings below may reference images not displayed]

FINDINGS: No acute fracture or dislocation.  Soft tissues are
within normal limits.
IMPRESSION: No acute bony pathology.

## 2009-06-04 ENCOUNTER — Ambulatory Visit: Payer: Self-pay | Admitting: Internal Medicine

## 2009-06-04 LAB — CONVERTED CEMR LAB: Blood Glucose, Fingerstick: 256

## 2009-06-07 ENCOUNTER — Telehealth (INDEPENDENT_AMBULATORY_CARE_PROVIDER_SITE_OTHER): Payer: Self-pay | Admitting: Internal Medicine

## 2009-06-10 ENCOUNTER — Emergency Department (HOSPITAL_COMMUNITY): Admission: EM | Admit: 2009-06-10 | Discharge: 2009-06-10 | Payer: Self-pay | Admitting: Emergency Medicine

## 2009-07-08 IMAGING — CR DG TMJ OPEN & CLOSE BILAT
2 series · 2 of 2 positions shown · non-contrast
Comparison: Left nanogram 12/06/2008.

CLINICAL DATA: Bilateral temporomandibular joint pain.

TEMPOROMANDIBULAR JOINTS

[w mandible,obl. * (1 of 2)]
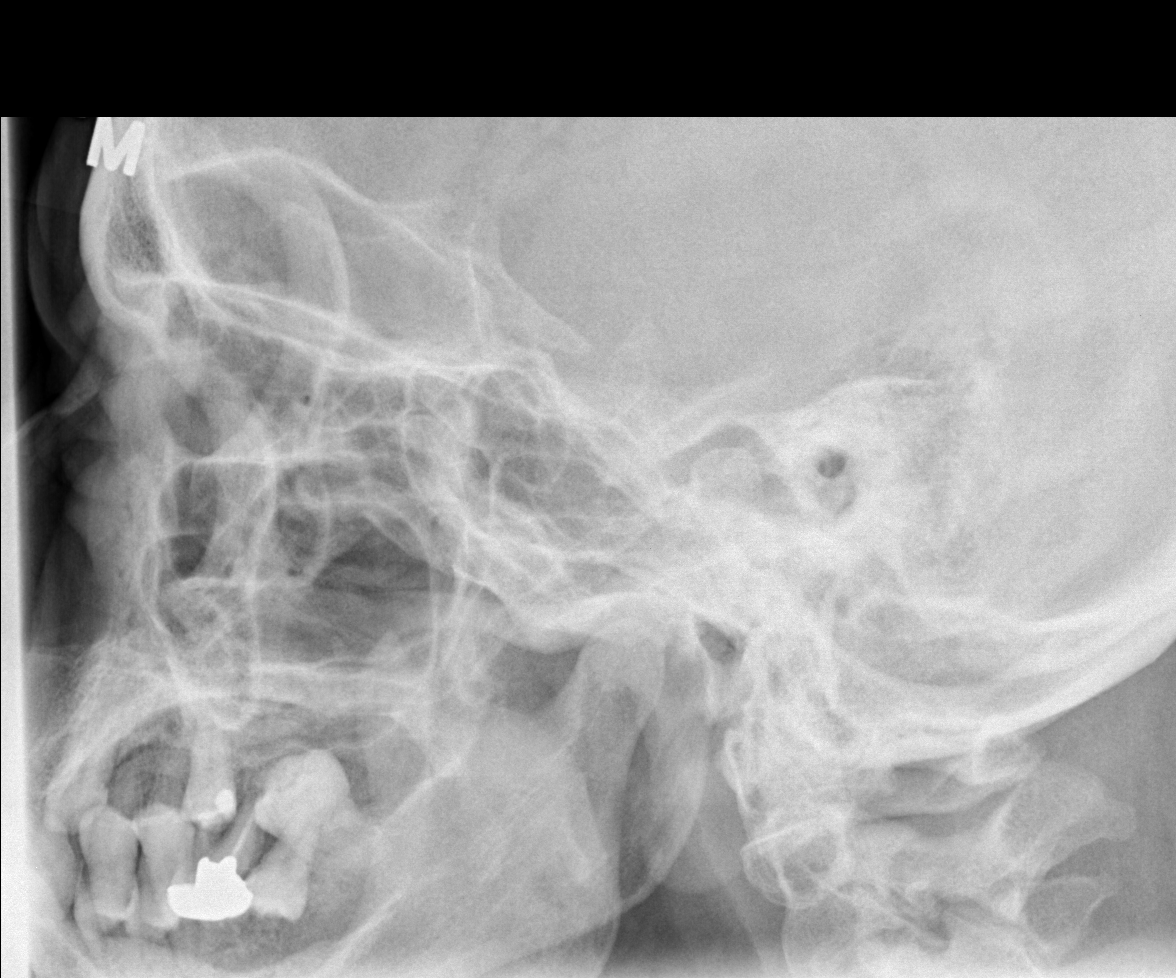

[w mandible,obl. * (2 of 2)]
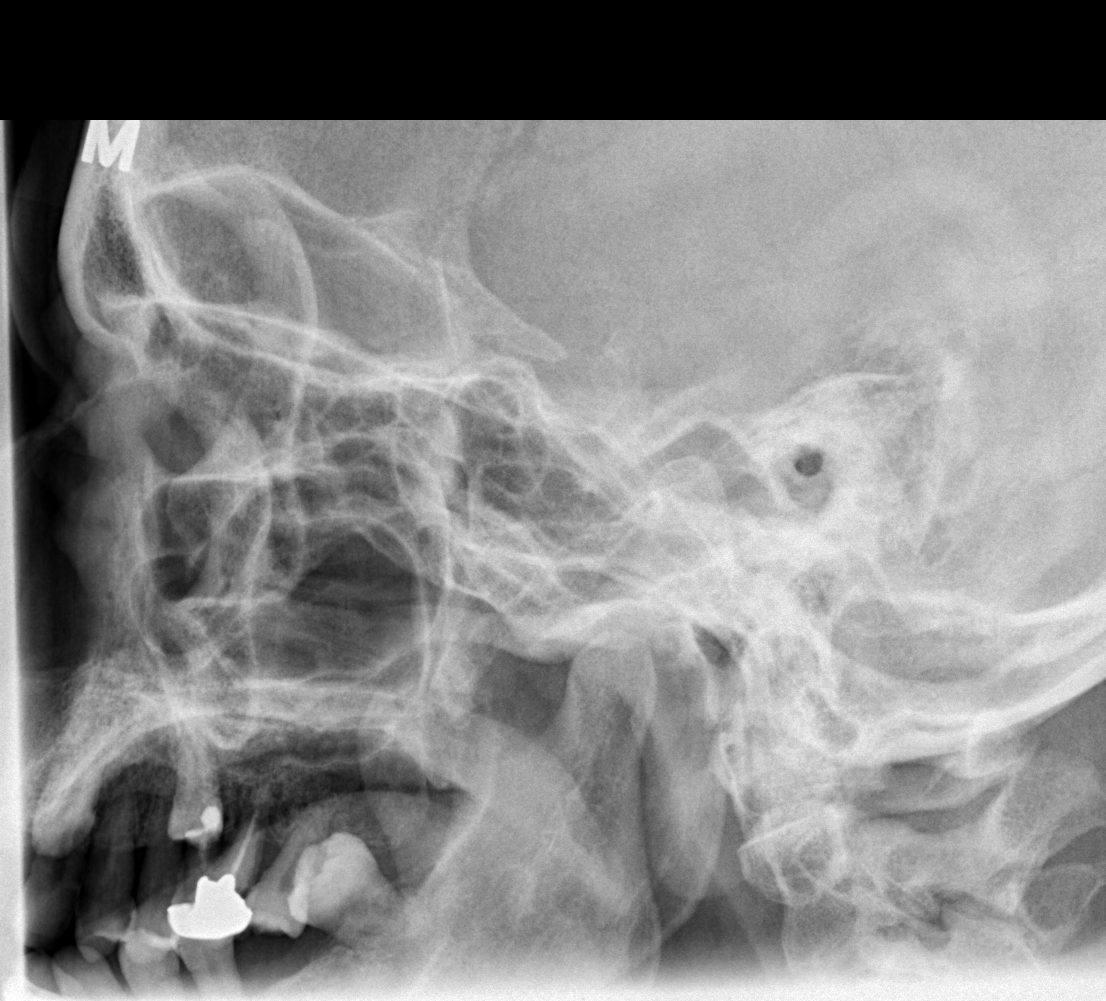

[2 of 2 positions shown; findings below may reference images not displayed]

FINDINGS: The temporomandibular joint is located bilaterally.
Appropriate anterior translation is present with opening of the
mouth bilaterally.  The joint space may be slightly smaller on the
left than right, consistent with degeneration of the cartilage
disc.
IMPRESSION: 1.  Suggestion of degenerated cartilage disc in the left TMJ.

## 2009-07-08 IMAGING — CR DG CHEST 2V
2 series · 2 of 2 positions shown · non-contrast
Comparison: 02/03/2007

CLINICAL DATA: Fever and shortness of breath.

CHEST - 2 VIEW

[w chest pa]
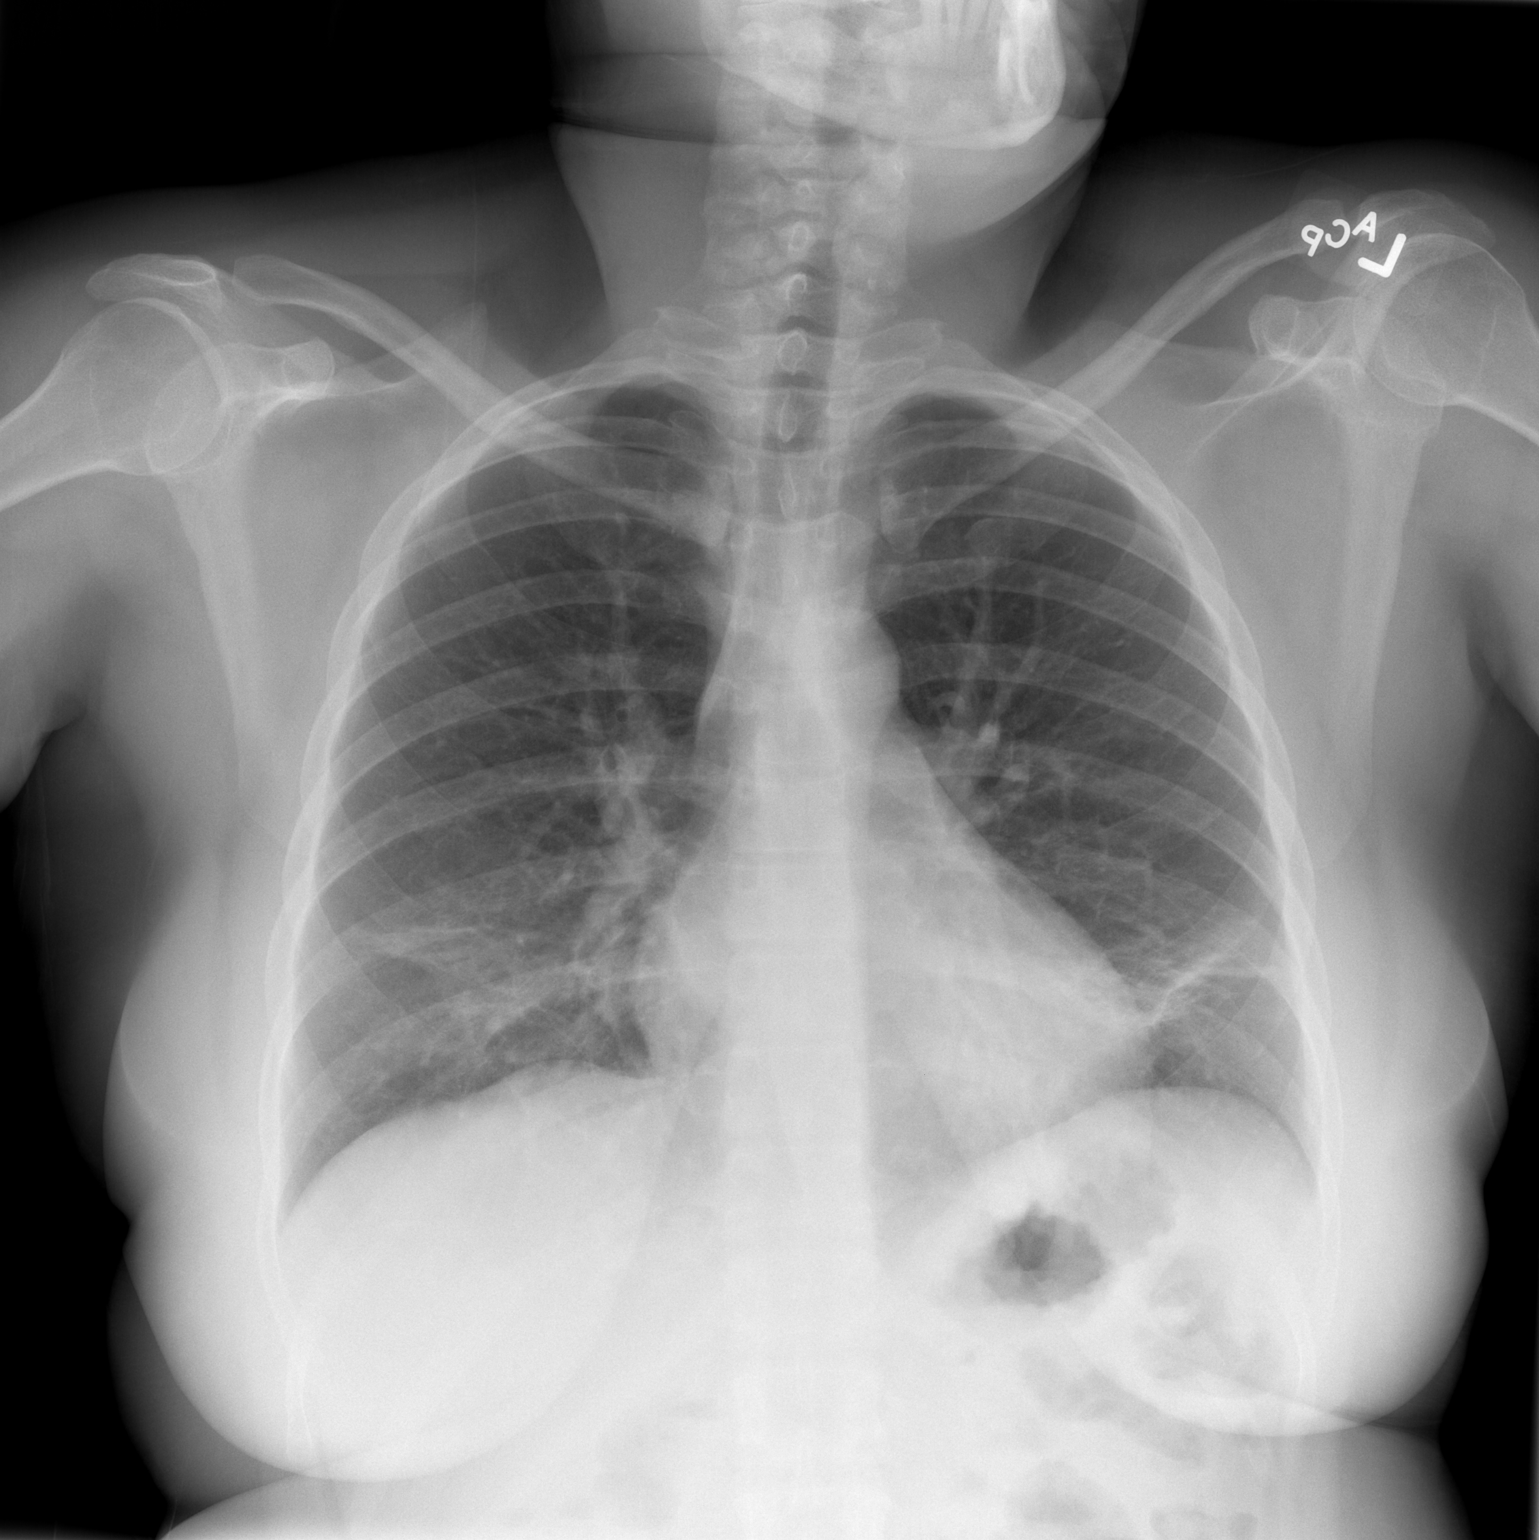

[w chest lat]
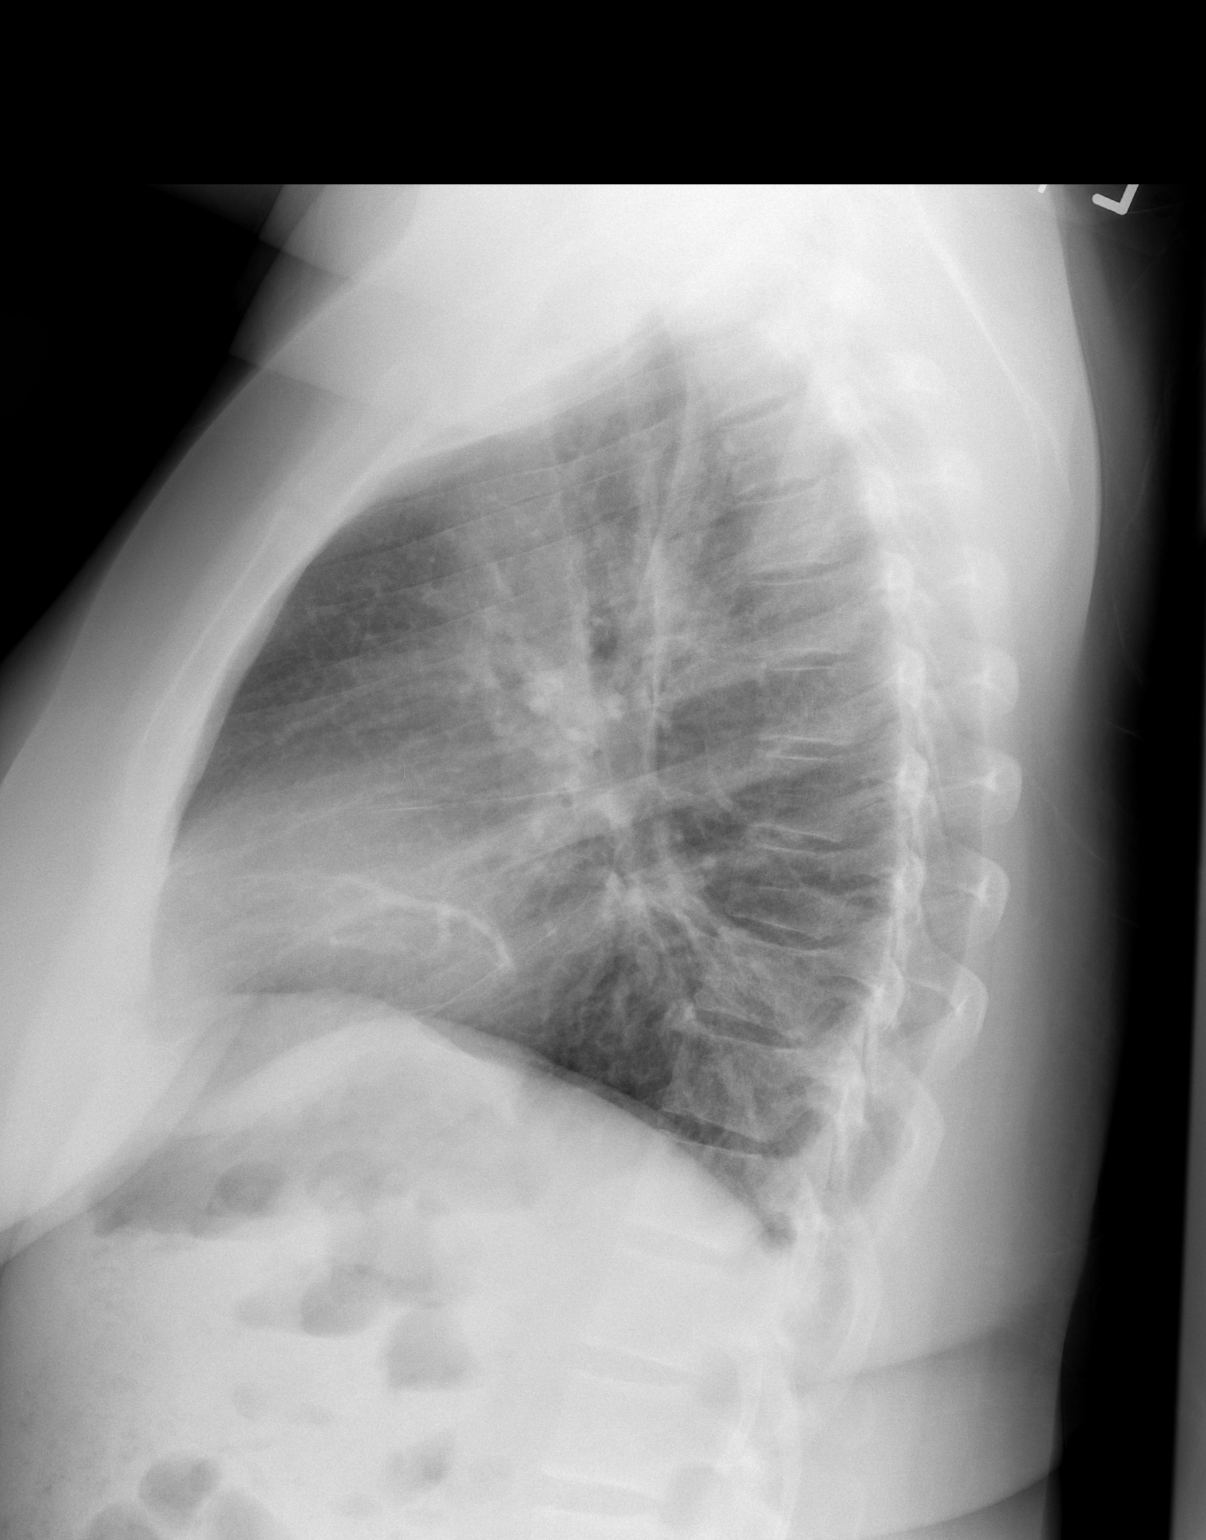

[2 of 2 positions shown; findings below may reference images not displayed]

FINDINGS: The cardiac silhouette, mediastinal and hilar contours
are within normal limits and stable.  Streaky bilateral lower lobe
airspace opacities.  This is most likely atelectasis but cannot
exclude developing infiltrates.  No effusions.  No edema.  The bony
thorax is intact.
IMPRESSION: Streaky bibasilar lung opacities most likely atelectasis.  Early
infiltrates cannot be excluded.

## 2009-07-31 DIAGNOSIS — I639 Cerebral infarction, unspecified: Secondary | ICD-10-CM

## 2009-07-31 HISTORY — DX: Cerebral infarction, unspecified: I63.9

## 2009-08-06 ENCOUNTER — Ambulatory Visit: Payer: Self-pay | Admitting: Internal Medicine

## 2009-08-13 ENCOUNTER — Telehealth (INDEPENDENT_AMBULATORY_CARE_PROVIDER_SITE_OTHER): Payer: Self-pay | Admitting: Internal Medicine

## 2009-08-15 IMAGING — CR DG CHEST 2V
2 series · 2 of 2 positions shown · non-contrast
Comparison: 12/06/2008

CLINICAL DATA: Left shoulder pain

CHEST - 2 VIEW

[w chest pa]
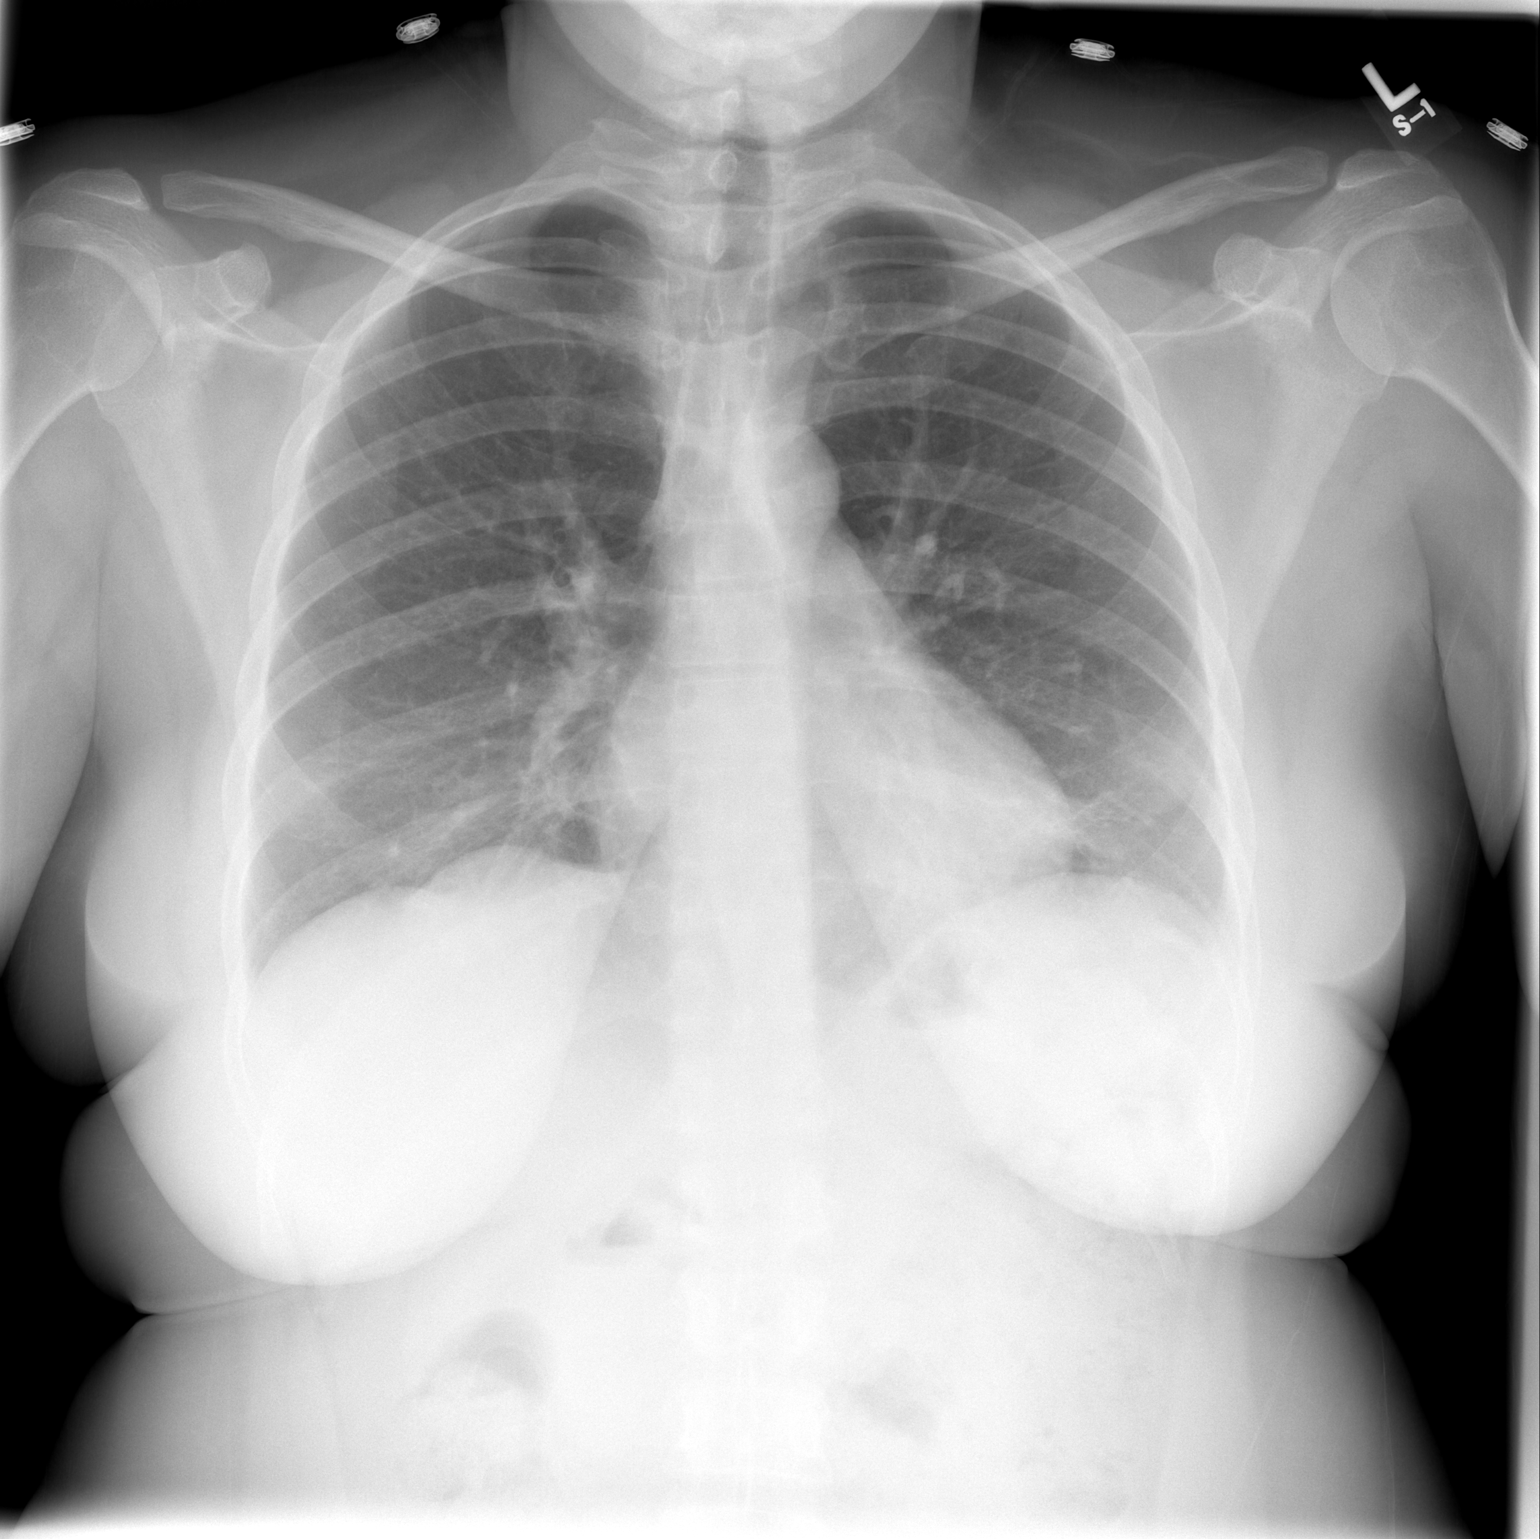

[w chest lat]
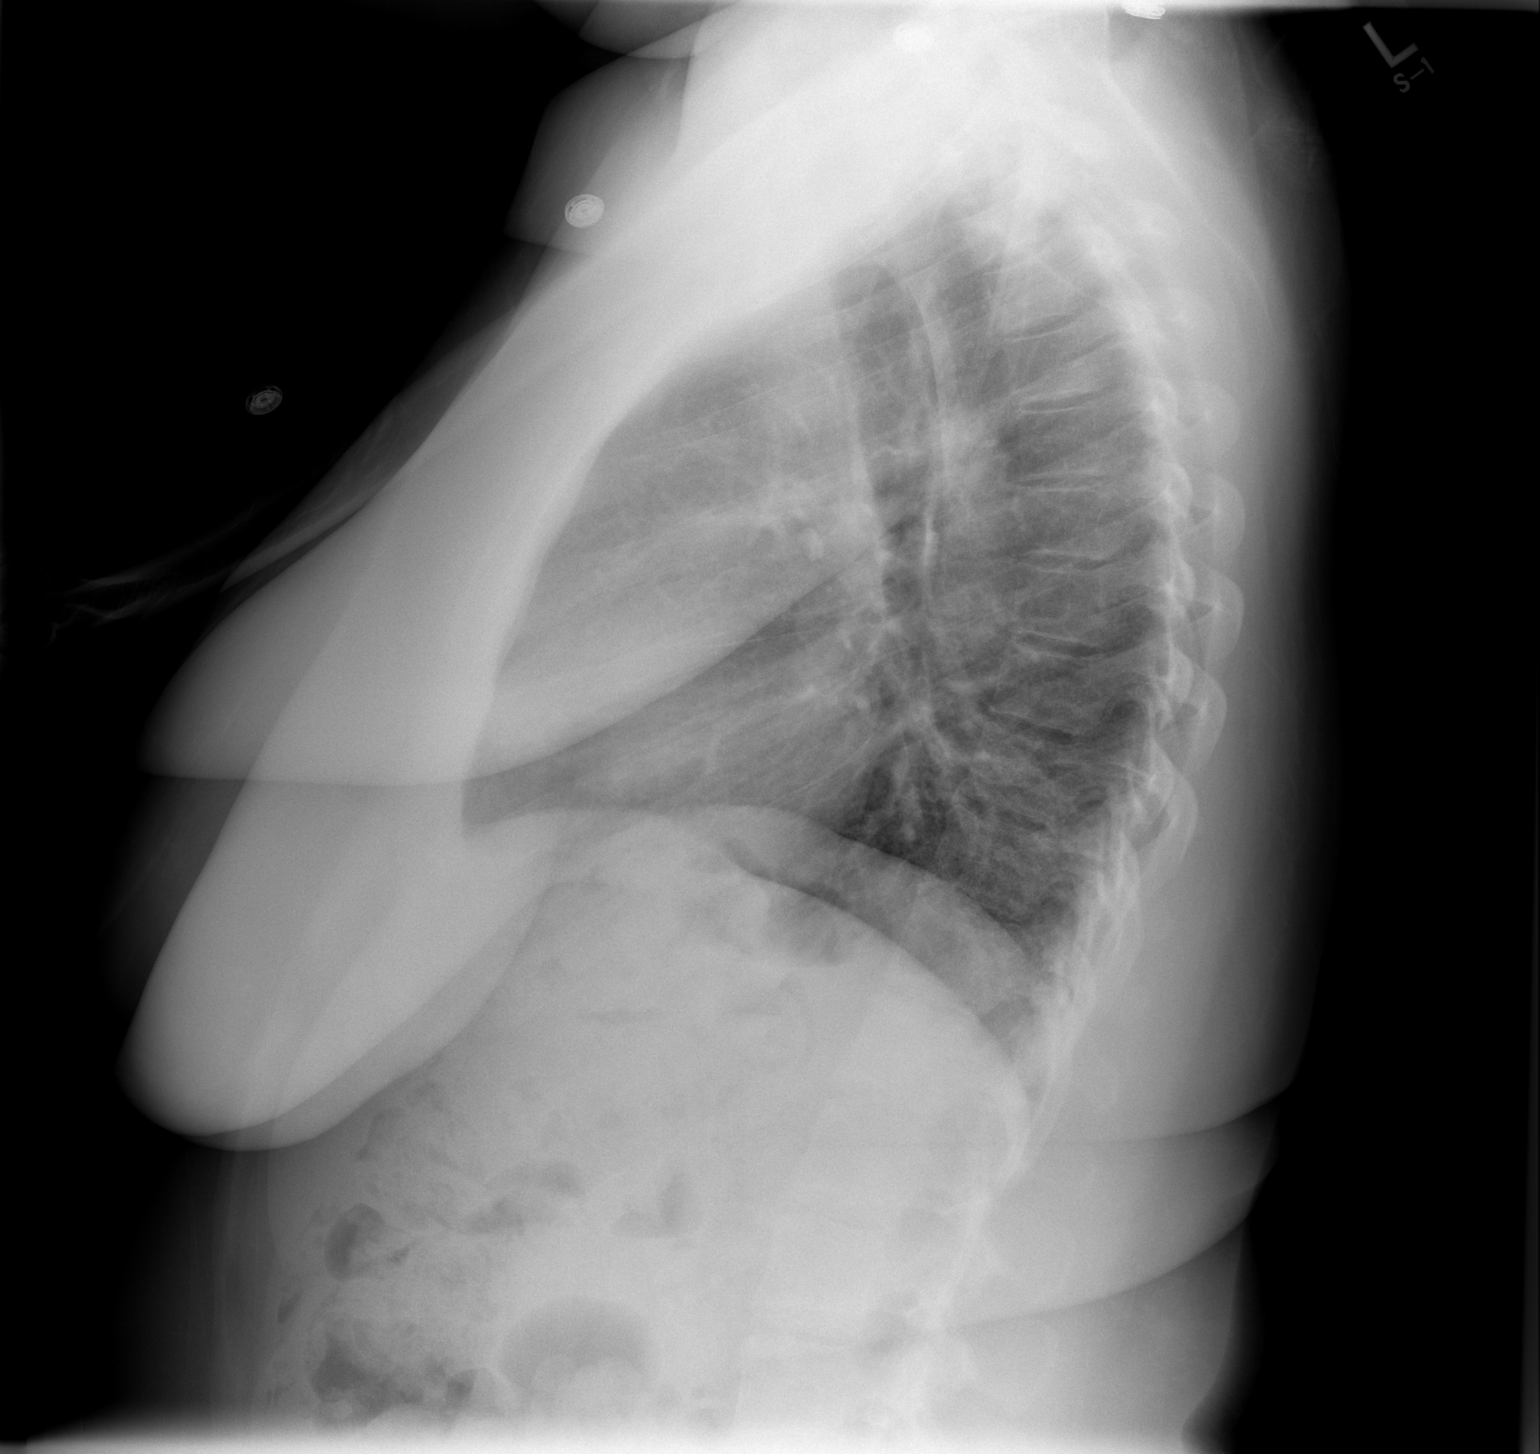

[2 of 2 positions shown; findings below may reference images not displayed]

FINDINGS: Minimal residual atelectasis or scar at the lung bases,
significantly improved compared to the prior study.  Doubt active
disease.

Heart and mediastinal contours normal.  Osseous structures intact.
IMPRESSION: Minimal atelectasis or scar at the lung bases - significantly
improved compared to 12/06/2008.  No new findings.

## 2009-08-16 ENCOUNTER — Encounter: Admission: RE | Admit: 2009-08-16 | Discharge: 2009-08-16 | Payer: Self-pay | Admitting: Internal Medicine

## 2009-08-17 ENCOUNTER — Ambulatory Visit: Payer: Self-pay | Admitting: Internal Medicine

## 2009-08-17 LAB — CONVERTED CEMR LAB
Blood Glucose, Fingerstick: 97
Hgb A1c MFr Bld: 8.7 %

## 2009-08-22 IMAGING — CT CT CERVICAL SPINE W/O CM
3 of 5 series · 12 of 29 positions shown, 13 images · non-contrast
Comparison: Unenhanced cranial CT and MRI brain 02/02/2007.

CT HEAD

time of her cardiac event today and complained of slurred speech
immediately thereafter.

CT HEAD WITHOUT CONTRAST
CT CERVICAL SPINE WITHOUT CONTRAST
TECHNIQUE: Multidetector CT imaging of the head and cervical spine
was performed following the standard protocol without intravenous
contrast.  Multiplanar CT image reconstructions of the cervical
spine were also generated.

[Series 3: recon 2: brain · axial · 0.47mm/px · z∈[-81,-10]mm · 3 of 72 slices shown]
[im 18/72  bone]
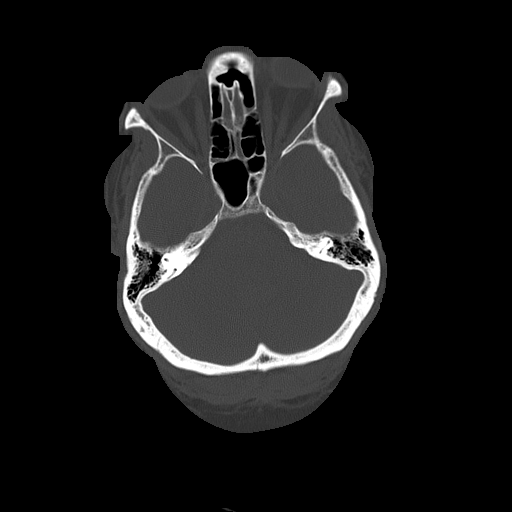
[im 36/72  bone]
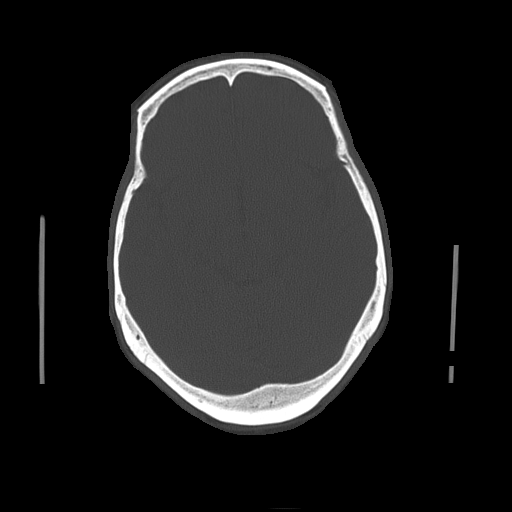
[im 54/72  bone]
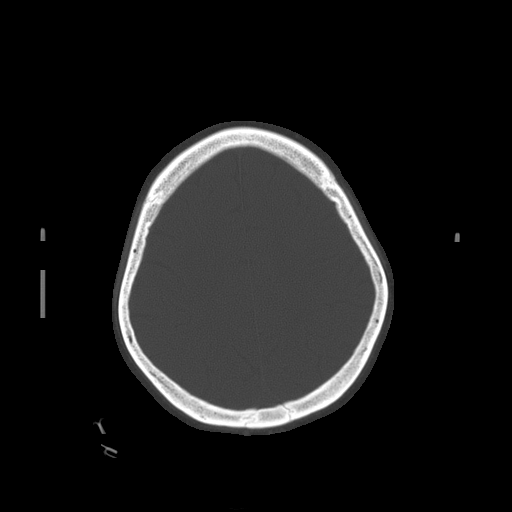

[Series 5: recon 2: cervical spine · axial · 0.35mm/px · z∈[-284,-166]mm · 4 of 79 slices shown, 5 images]
[im 16/79  soft-tissue]
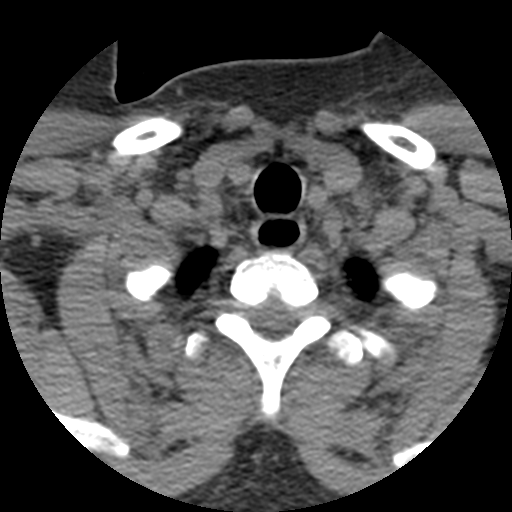
[im 16/79  bone]
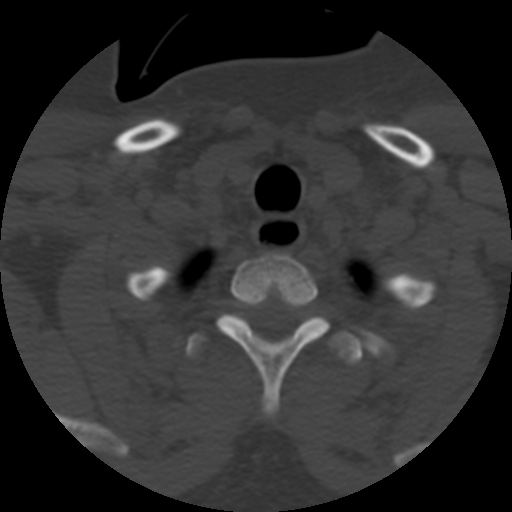
[im 32/79  bone]
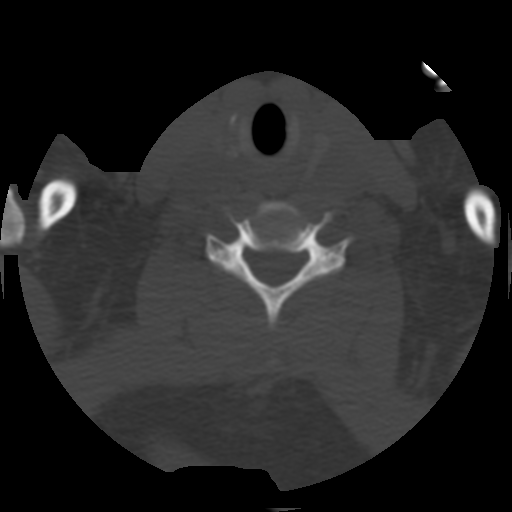
[im 47/79  bone]
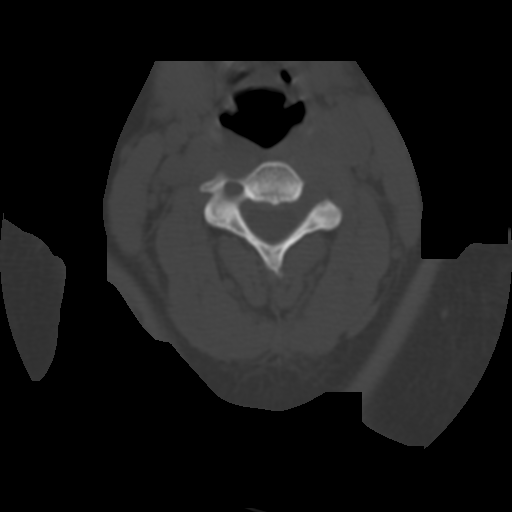
[im 63/79  bone]
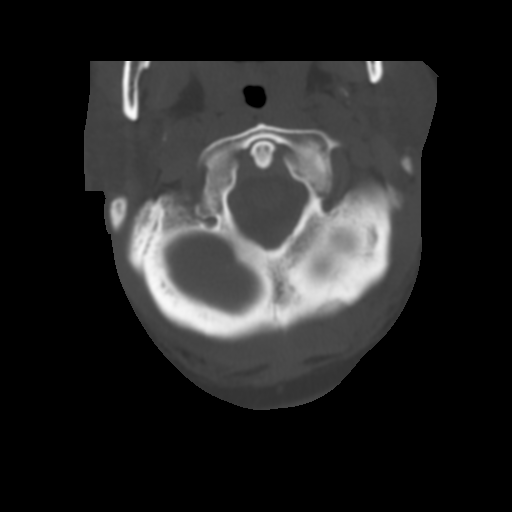

[Series 600: reformatted · coronal · 0.39mm/px · 5 of 59 slices shown]
[im 12/59  bone]
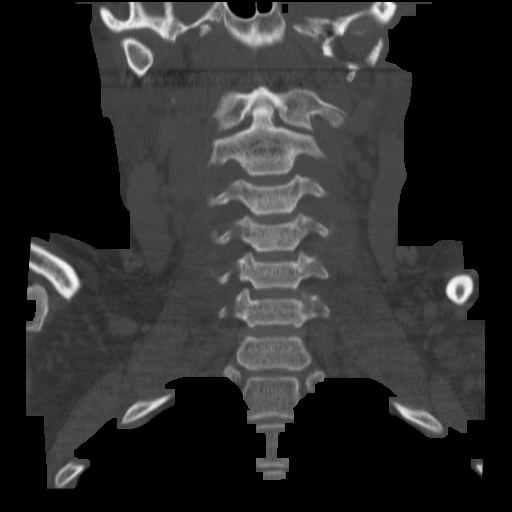
[im 21/59  soft-tissue]
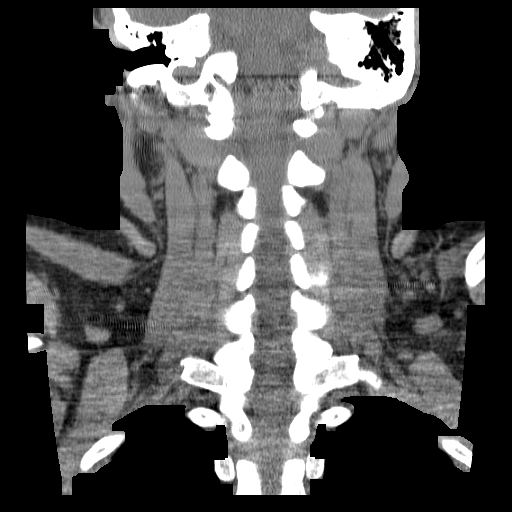
[im 24/59  bone]
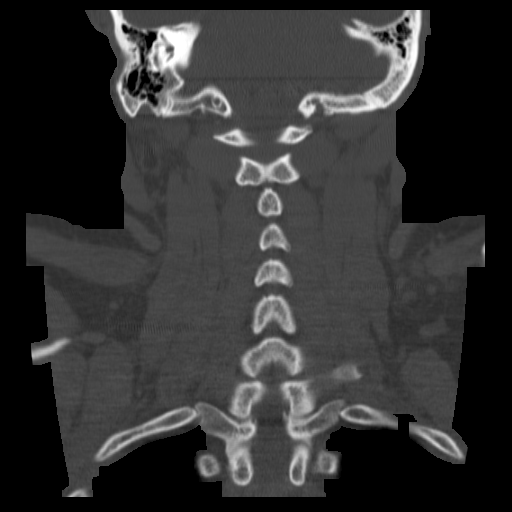
[im 35/59  bone]
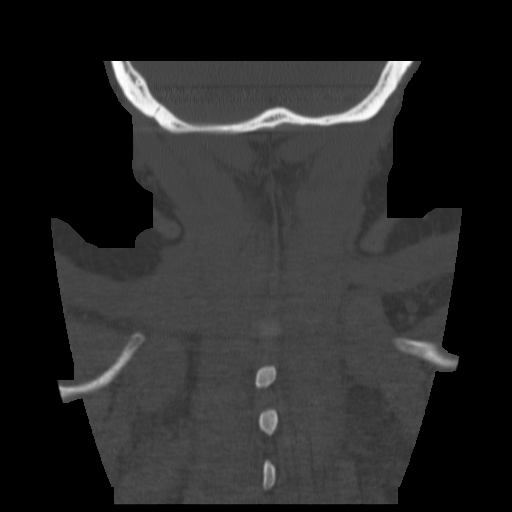
[im 47/59  bone]
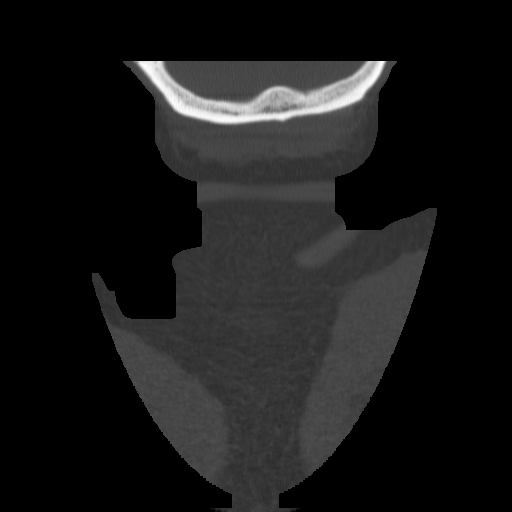

[12 of 29 positions shown; findings below may reference images not displayed]

FINDINGS: Ventricular system normal in size appearance for age,
unchanged.  No mass lesion.  No midline shift.  No acute hemorrhage
or hematoma.  No extra-axial fluid collections.  Physiologic
calcifications in the basal ganglia.  Scattered low attenuation
foci in the cerebral white matter, unchanged.  No new intracranial
abnormality.

No skull fractures.  Visualized paranasal sinuses, mastoid air
cells, and middle ear cavities well-aerated.  Early bilateral
carotid siphon atherosclerosis.
IMPRESSION: 1.  No acute intracranial abnormalities.
2.  Scattered white matter hypodensities, unchanged, likely related
to small vessel disease.
3.  Bilateral carotid siphon atherosclerosis, somewhat advanced for
age.

CT CERVICAL SPINE
FINDINGS: Patient motion blurred many of the images but a
diagnostic study was obtained.  No fractures identified involving
the cervical spine.  Soft tissue window images demonstrate no gross
disc protrusions.  Sagittal reconstructed images demonstrate
anatomic alignment.  Facet joints intact throughout.  No spinal
stenosis.  No significant bony foraminal stenosis.  Coronal
reformatted images demonstrate intact craniocervical junction,
intact C1-C2 articulation, intact dens, and intact lateral masses.
IMPRESSION: 1.  No cervical spine fractures identified.
2.  Essentially normal CT of the cervical spine.

Results were discussed directly with the attending cardiologist at
the time of interpretation on 01/20/2009 at approximately 0200
hours.

## 2009-08-24 IMAGING — CR DG CHEST 1V PORT
1 series · 1 of 1 positions shown · non-contrast
Comparison: 01/13/2009

CLINICAL DATA: Acute myocardial infarction.  Complete heart block.
Hypoxia.  Change in respiratory status

PORTABLE CHEST - 1 VIEW

[AP]
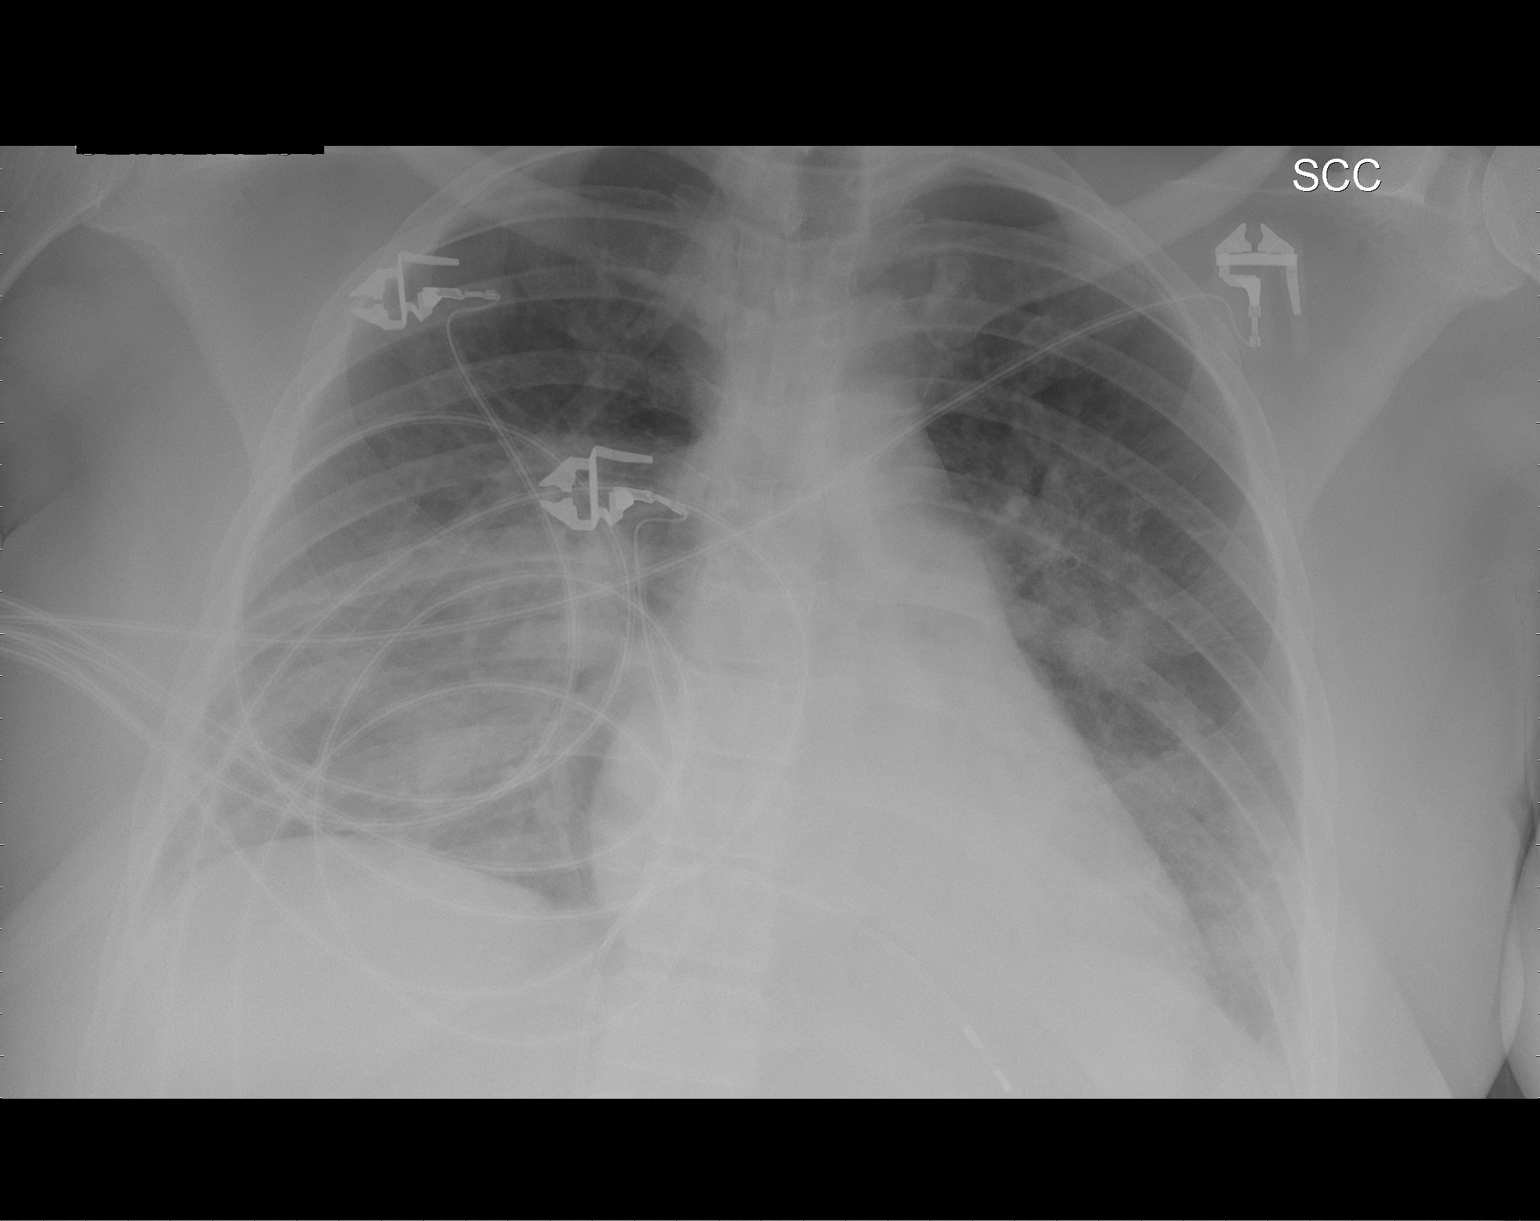

[1 of 1 positions shown; findings below may reference images not displayed]

FINDINGS: The patient has developed bilateral infiltrates in the
perihilar regions extending into the bases.  The heart size and
vascularity are within normal limits.  No bony abnormality.
Temporary pacer is in place.
IMPRESSION: New bilateral infiltrates which could represent pneumonia or
pulmonary edema.

## 2009-08-25 IMAGING — CR DG CHEST 1V PORT
1 series · 1 of 1 positions shown · non-contrast
Comparison: 01/22/2009

CLINICAL DATA: Evaluate PICC line placement

PORTABLE CHEST - 1 VIEW

[AP]
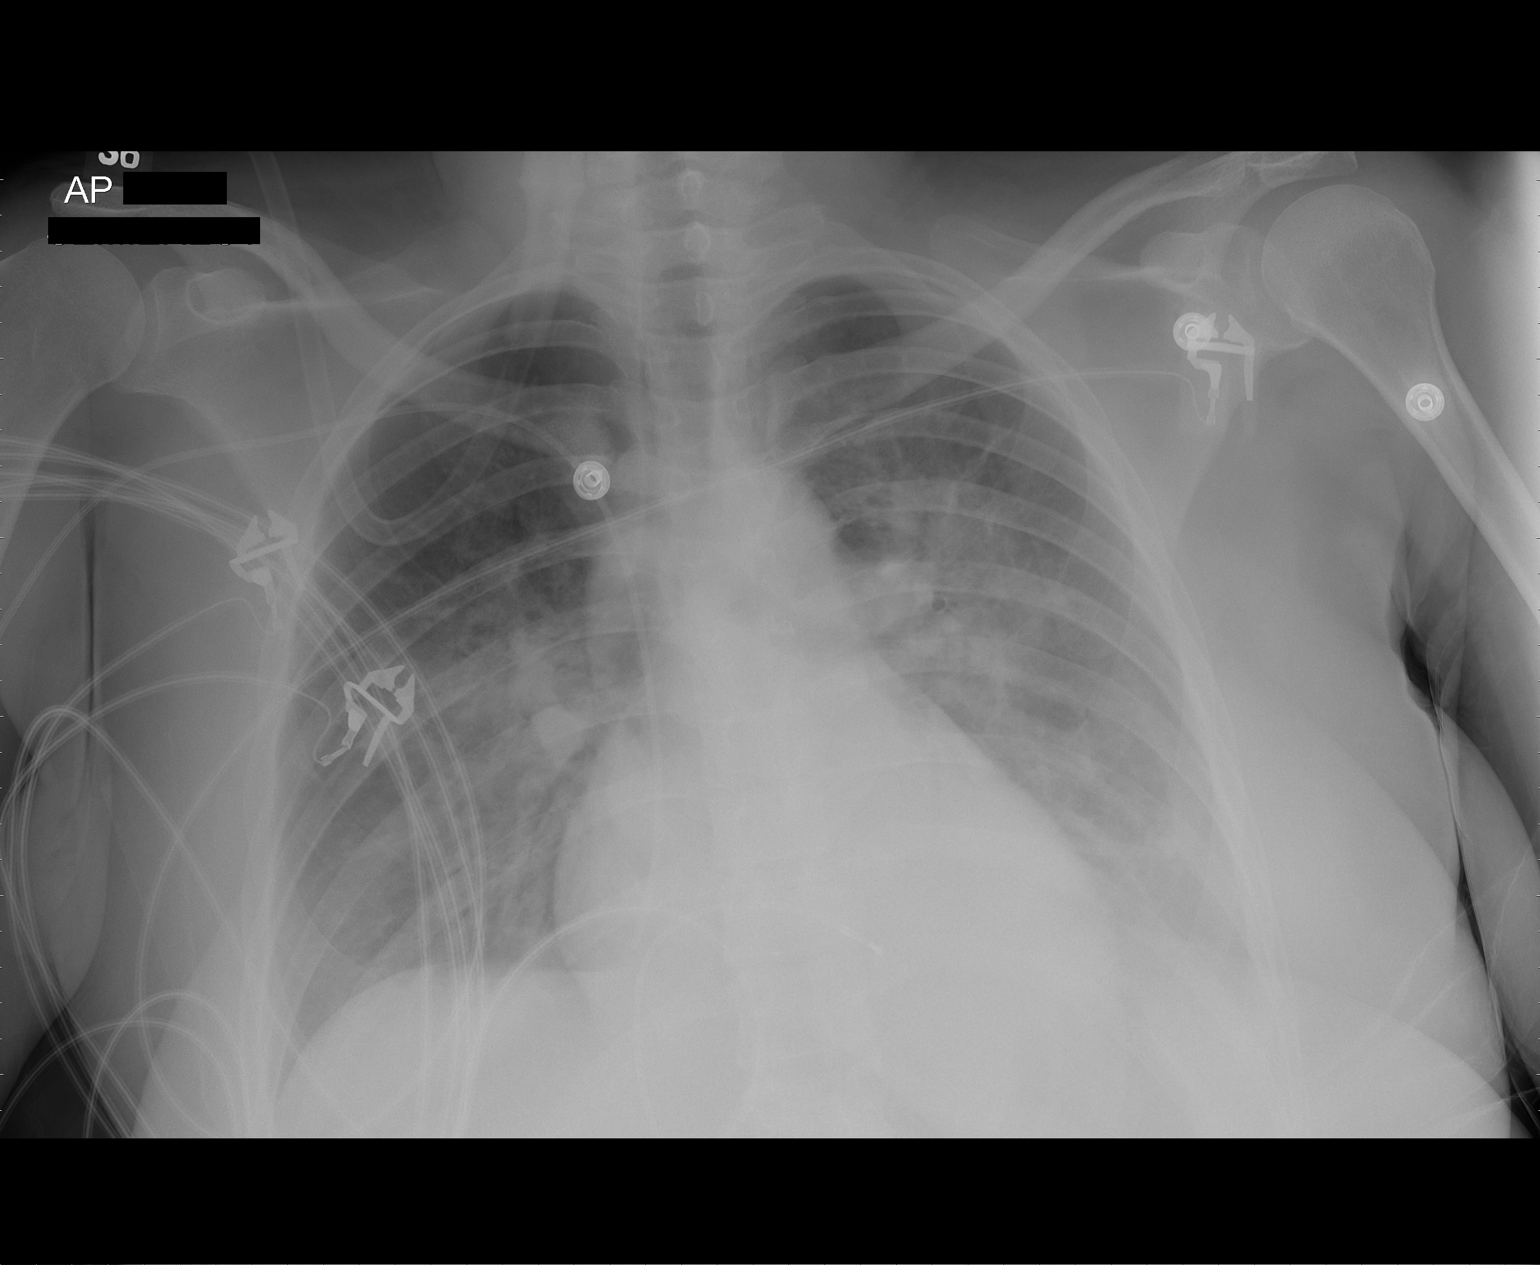

[1 of 1 positions shown; findings below may reference images not displayed]

FINDINGS: The patient has a right PICC line that terminates in the
right atrium.  The PICC line could be pulled back 4 cm for
placement at the cavoatrial junction.  There are diffuse hazy lung
opacifications most consistent with edema.  There is probably left
pleural fluid.  Cardiac silhouette is stable.
IMPRESSION: PICC line in the right atrium.

Diffuse haziness of the lungs suggestive for pulmonary edema and
left pleural fluid.

## 2009-08-26 ENCOUNTER — Encounter: Admission: RE | Admit: 2009-08-26 | Discharge: 2009-11-24 | Payer: Self-pay | Admitting: Internal Medicine

## 2009-08-27 ENCOUNTER — Encounter (INDEPENDENT_AMBULATORY_CARE_PROVIDER_SITE_OTHER): Payer: Self-pay | Admitting: Internal Medicine

## 2009-08-28 IMAGING — CR DG CHEST 1V PORT
1 series · 1 of 1 positions shown · non-contrast
Comparison: 01/23/2009

CLINICAL DATA: Myocardial infarction

PORTABLE CHEST - 1 VIEW

[AP]
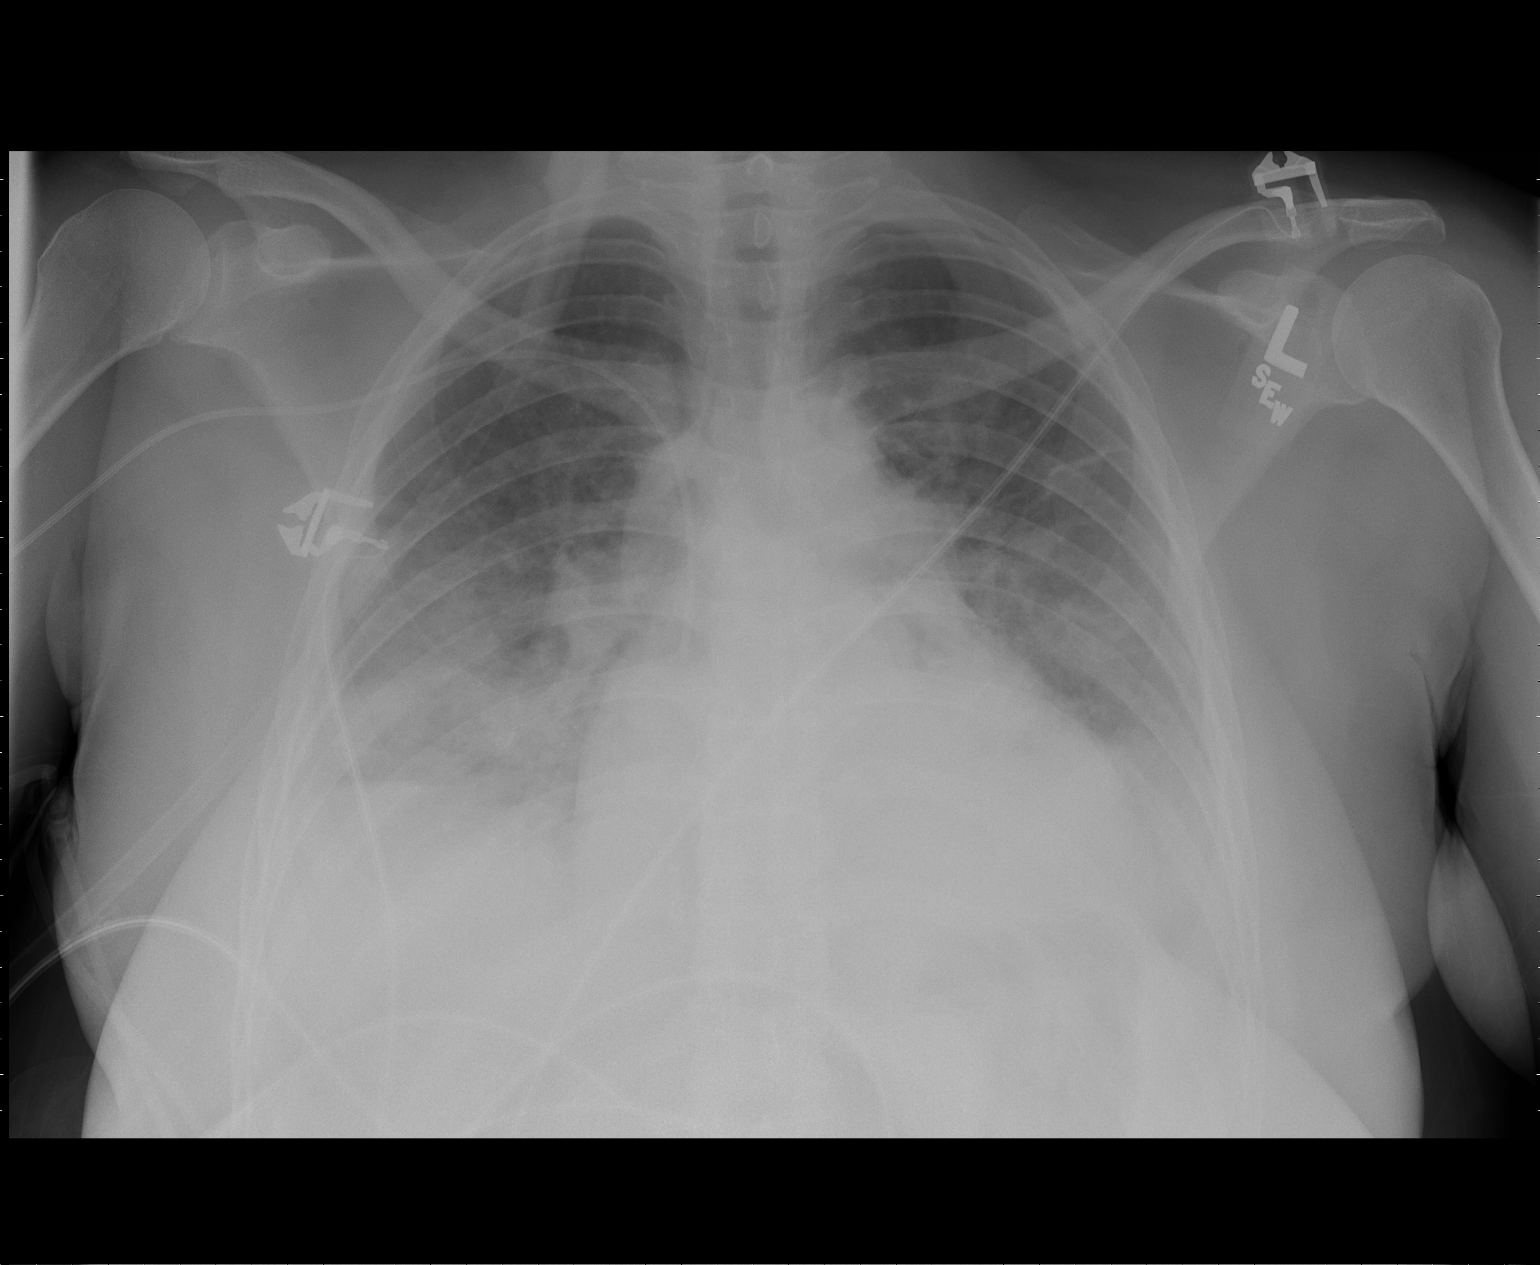

[1 of 1 positions shown; findings below may reference images not displayed]

FINDINGS: Right arm PICC extends to the low SVC.  There has been
some increase in bibasilar airspace opacities.  Perihilar edema and
pulmonary vascular congestion persist.  Suspect layering pleural
effusions.  Mild cardiomegaly stable.
IMPRESSION: 1.  Some increase in bibasilar edema or infiltrates.

## 2009-08-31 ENCOUNTER — Emergency Department (HOSPITAL_COMMUNITY): Admission: EM | Admit: 2009-08-31 | Discharge: 2009-08-31 | Payer: Self-pay | Admitting: Emergency Medicine

## 2009-09-10 ENCOUNTER — Ambulatory Visit: Payer: Self-pay | Admitting: Internal Medicine

## 2009-09-14 ENCOUNTER — Ambulatory Visit: Payer: Self-pay | Admitting: Internal Medicine

## 2009-09-14 IMAGING — CR DG ELBOW COMPLETE 3+V*L*
4 series · 4 of 4 positions shown · non-contrast
Comparison: None

CLINICAL DATA: Left elbow pain.

LEFT ELBOW - COMPLETE 3+ VIEW

[x elbow joint ap left]
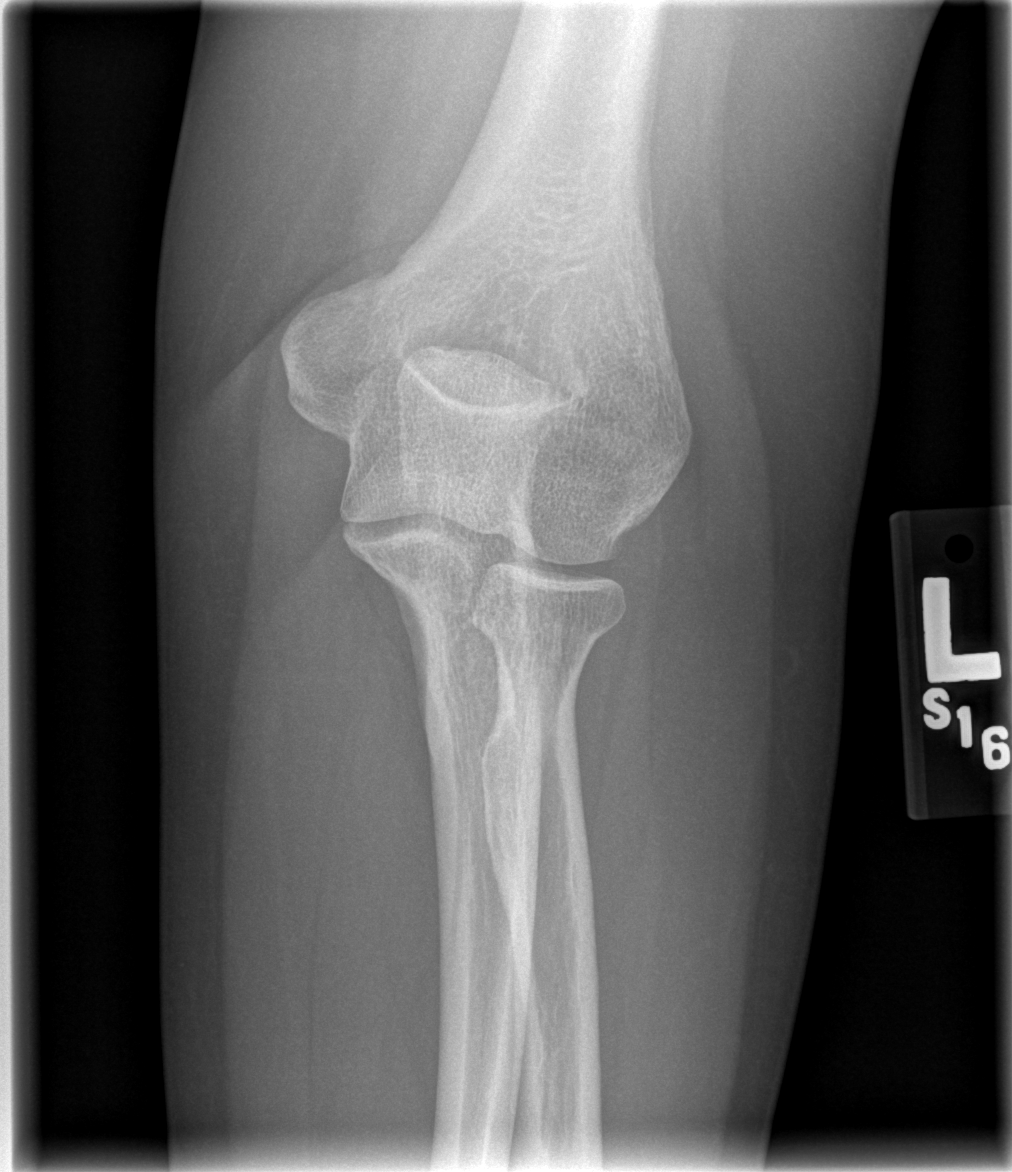

[x elbow joint obl. left (1 of 2)]
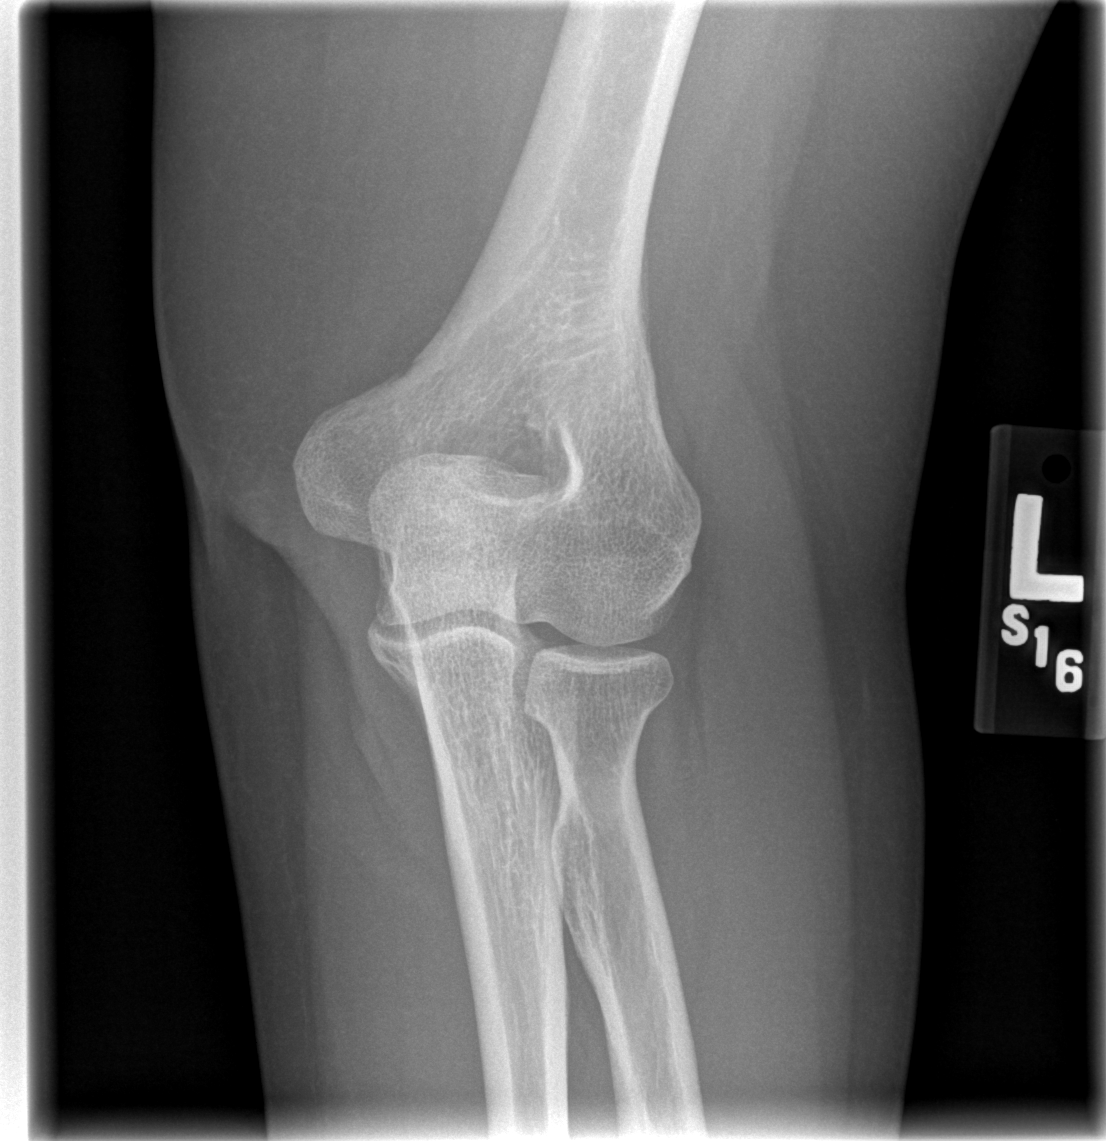

[x elbow joint obl. left (2 of 2)]
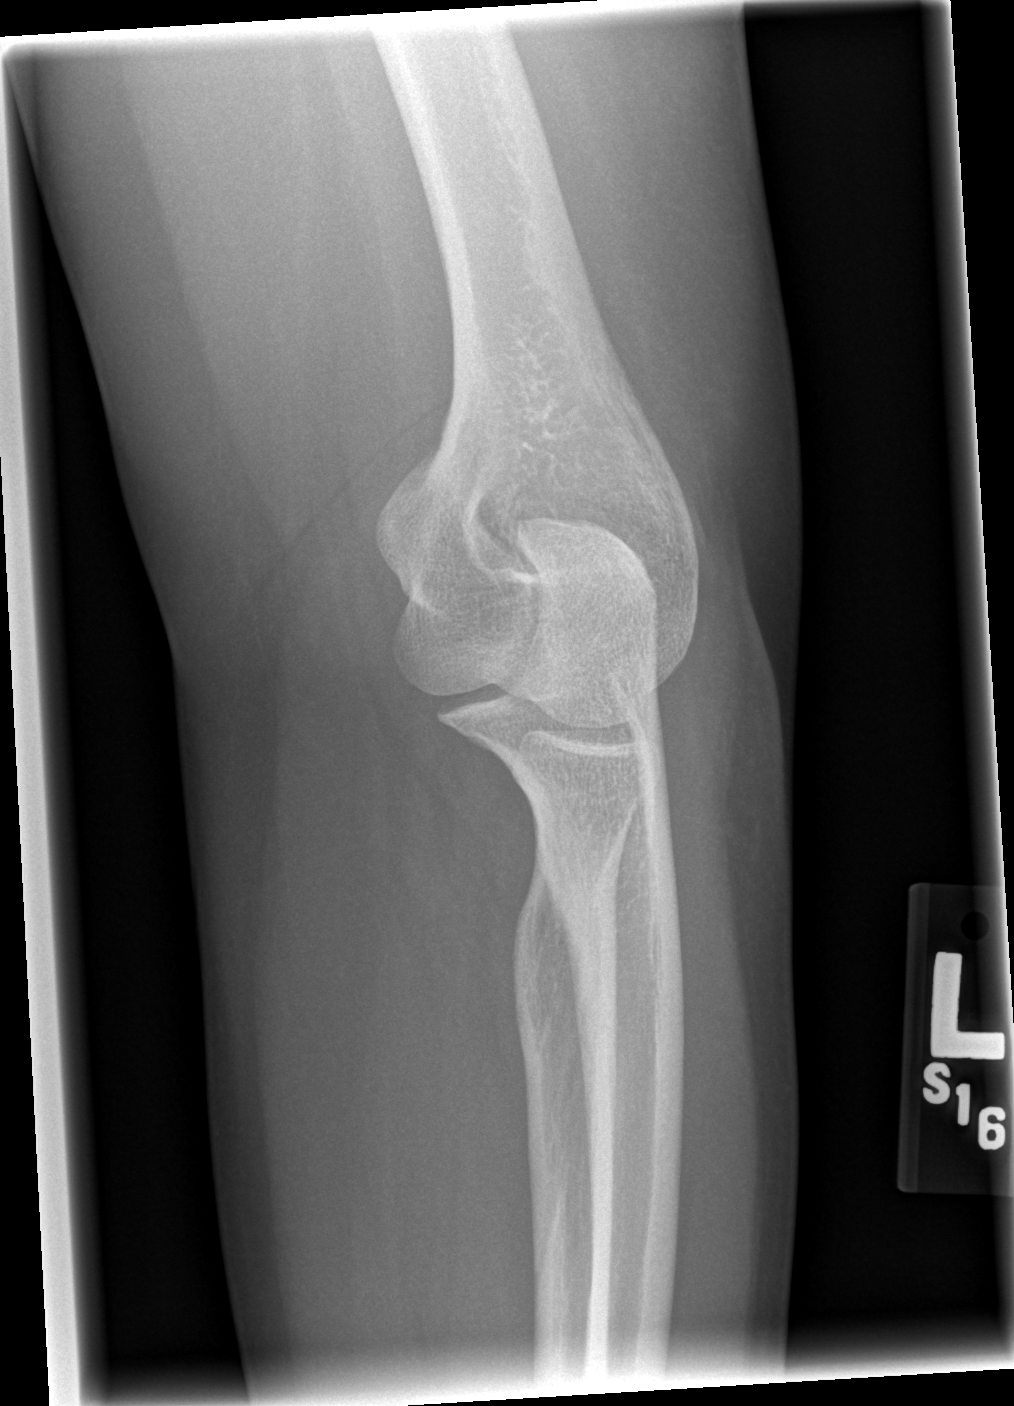

[x elbow joint lat left]
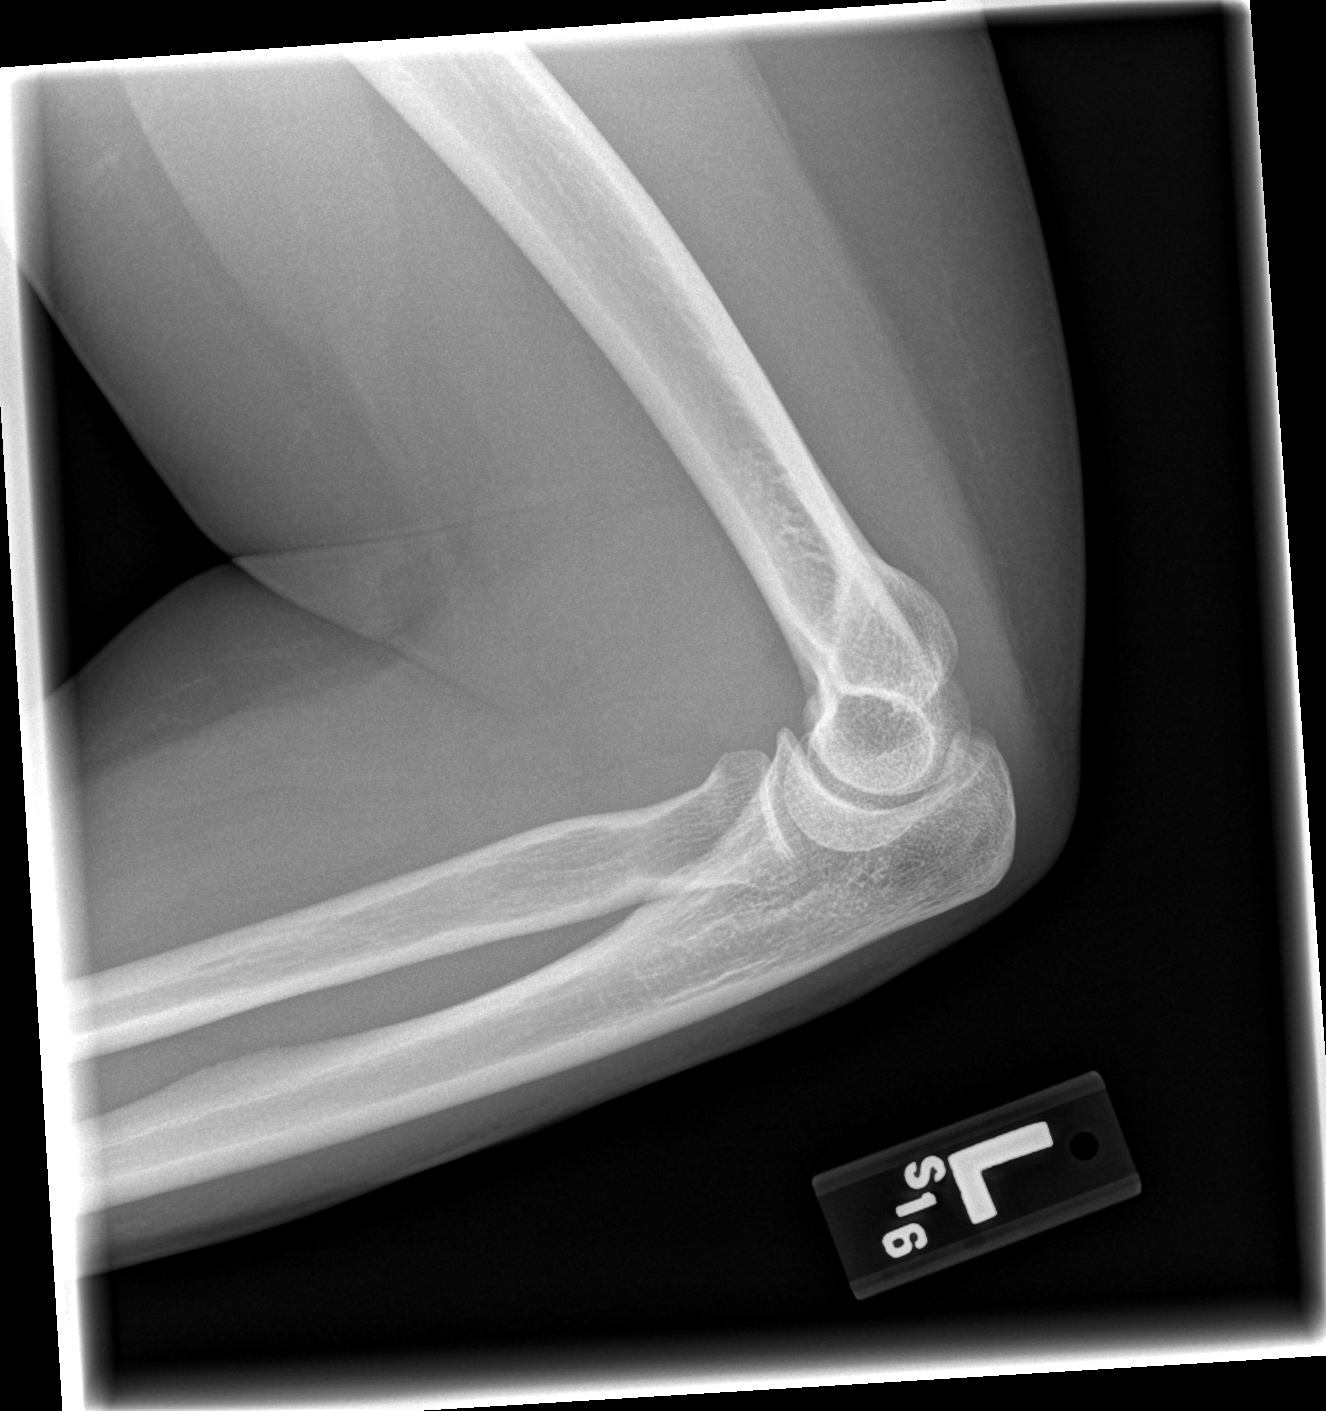

[4 of 4 positions shown; findings below may reference images not displayed]

FINDINGS: There is no evidence of acute bony abnormality.
No evidence of acute fracture, subluxation, or dislocation.
No focal bony lesions are present.
There may be a very small joint effusion present.
IMPRESSION: Question very small joint effusion.

No evidence of acute bony abnormality identified.

## 2009-09-14 IMAGING — CR DG ANKLE COMPLETE 3+V*L*
3 series · 3 of 3 positions shown · non-contrast
Comparison: None

CLINICAL DATA: Left ankle pain.

LEFT ANKLE COMPLETE - 3+ VIEW

[t ankle joint ap left]
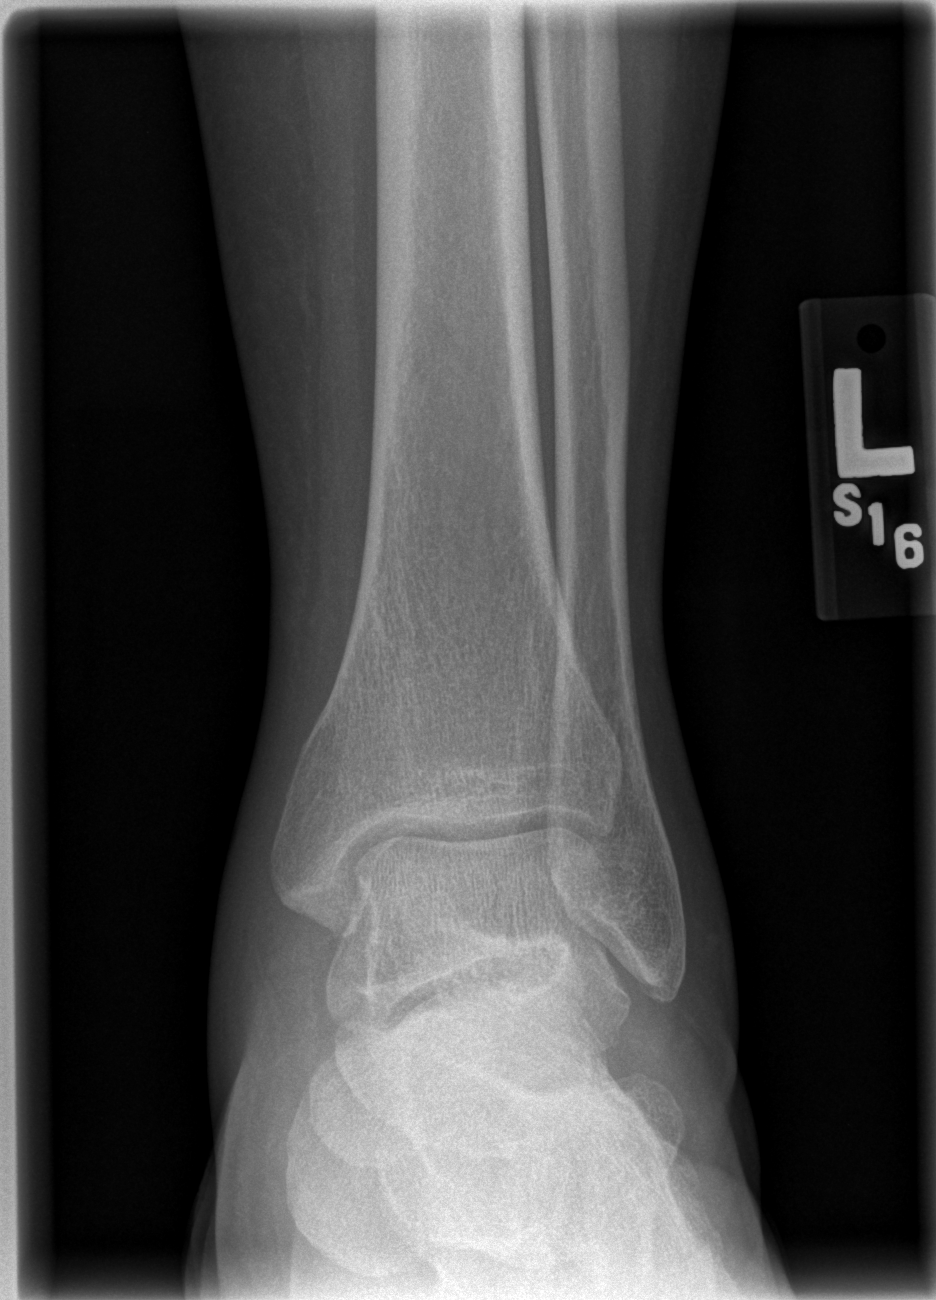

[t ankle joint oblique left]
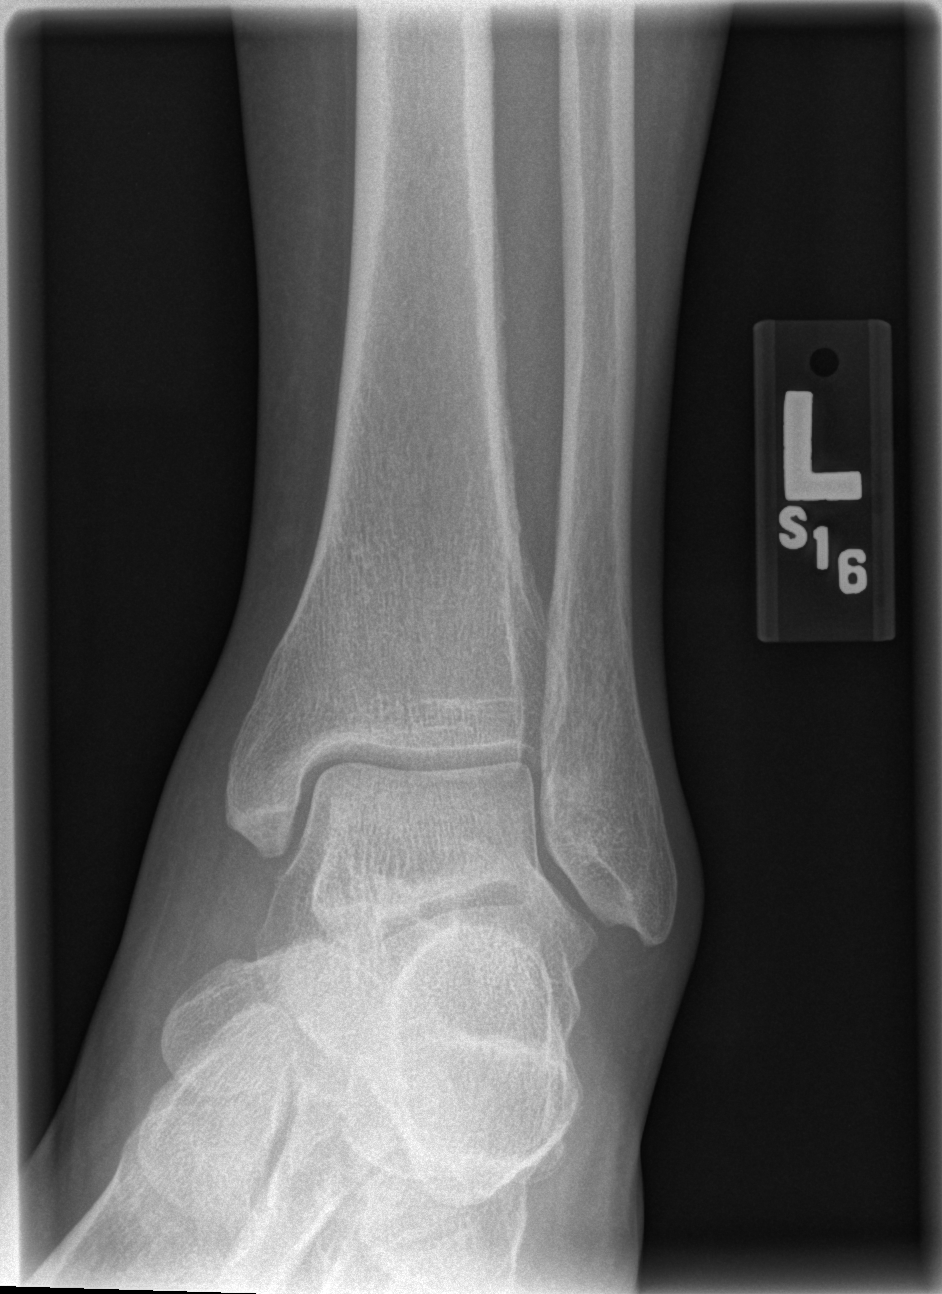

[t ankle joint lat left]
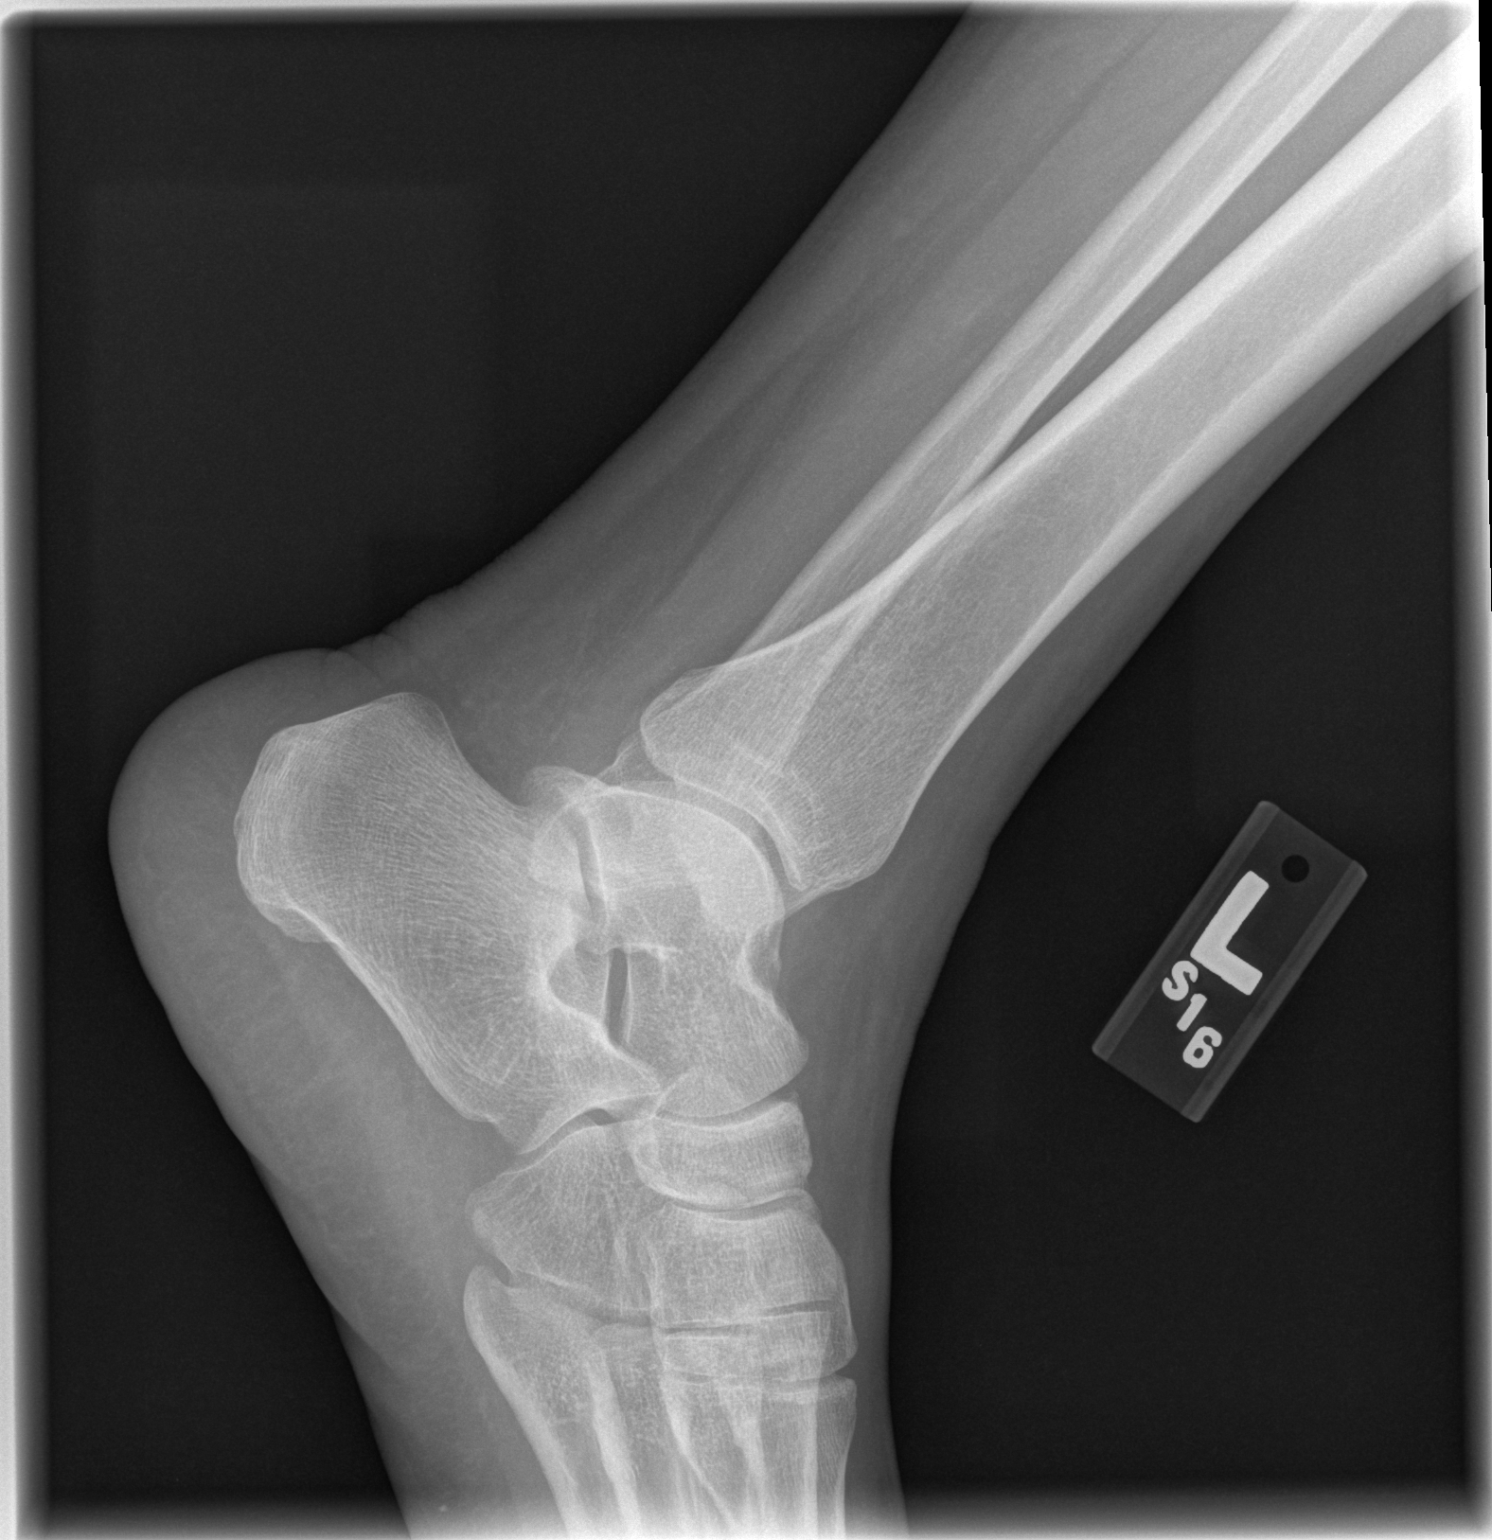

[3 of 3 positions shown; findings below may reference images not displayed]

FINDINGS: No evidence of acute fracture, subluxation or dislocation
identified.

No radio-opaque foreign bodies are present.

No focal bony lesions are noted.

The joint spaces are unremarkable.
IMPRESSION: No evidence of acute bony abnormality.

## 2009-09-21 ENCOUNTER — Observation Stay (HOSPITAL_COMMUNITY): Admission: EM | Admit: 2009-09-21 | Discharge: 2009-09-21 | Payer: Self-pay | Admitting: Emergency Medicine

## 2009-09-23 ENCOUNTER — Telehealth (INDEPENDENT_AMBULATORY_CARE_PROVIDER_SITE_OTHER): Payer: Self-pay | Admitting: Internal Medicine

## 2009-09-24 ENCOUNTER — Ambulatory Visit: Payer: Self-pay | Admitting: Internal Medicine

## 2009-09-24 ENCOUNTER — Ambulatory Visit (HOSPITAL_COMMUNITY): Admission: RE | Admit: 2009-09-24 | Discharge: 2009-09-24 | Payer: Self-pay | Admitting: Internal Medicine

## 2009-09-24 LAB — CONVERTED CEMR LAB: Blood Glucose, Fingerstick: 108

## 2009-09-25 ENCOUNTER — Encounter (INDEPENDENT_AMBULATORY_CARE_PROVIDER_SITE_OTHER): Payer: Self-pay | Admitting: Internal Medicine

## 2009-10-02 LAB — CONVERTED CEMR LAB
Alkaline Phosphatase: 113 units/L (ref 39–117)
CO2: 21 meq/L (ref 19–32)
Creatinine, Ser: 0.96 mg/dL (ref 0.40–1.20)
Cyclic Citrullin Peptide Ab: >340 units — ABNORMAL HIGH (ref <7)
Glucose, Bld: 114 mg/dL — ABNORMAL HIGH (ref 70–99)
Hep B Core Total Ab: NEGATIVE
Hep B S Ab: NEGATIVE
Hepatitis B Surface Ag: NEGATIVE
Rapid HIV Screen: NEGATIVE
Rhuematoid fact SerPl-aCnc: 342 intl units/mL — ABNORMAL HIGH (ref 0–20)
Sodium: 139 meq/L (ref 135–145)
Total Bilirubin: 0.3 mg/dL (ref 0.3–1.2)
Total Protein: 7.6 g/dL (ref 6.0–8.3)

## 2009-10-07 ENCOUNTER — Ambulatory Visit (HOSPITAL_COMMUNITY): Admission: RE | Admit: 2009-10-07 | Discharge: 2009-10-07 | Payer: Self-pay | Admitting: Internal Medicine

## 2009-10-08 ENCOUNTER — Ambulatory Visit: Payer: Self-pay | Admitting: Internal Medicine

## 2009-10-08 DIAGNOSIS — I10 Essential (primary) hypertension: Secondary | ICD-10-CM | POA: Insufficient documentation

## 2009-10-08 DIAGNOSIS — E1159 Type 2 diabetes mellitus with other circulatory complications: Secondary | ICD-10-CM | POA: Insufficient documentation

## 2009-10-08 LAB — CONVERTED CEMR LAB: Blood Glucose, Fingerstick: 47

## 2009-10-13 ENCOUNTER — Encounter (INDEPENDENT_AMBULATORY_CARE_PROVIDER_SITE_OTHER): Payer: Self-pay | Admitting: Internal Medicine

## 2009-10-14 ENCOUNTER — Ambulatory Visit: Payer: Self-pay | Admitting: Internal Medicine

## 2009-10-22 ENCOUNTER — Telehealth (INDEPENDENT_AMBULATORY_CARE_PROVIDER_SITE_OTHER): Payer: Self-pay | Admitting: *Deleted

## 2009-10-22 ENCOUNTER — Ambulatory Visit: Payer: Self-pay | Admitting: Internal Medicine

## 2009-10-22 LAB — CONVERTED CEMR LAB: Blood Glucose, Fingerstick: 91

## 2009-10-26 ENCOUNTER — Encounter (INDEPENDENT_AMBULATORY_CARE_PROVIDER_SITE_OTHER): Payer: Self-pay | Admitting: Internal Medicine

## 2009-10-26 LAB — CONVERTED CEMR LAB
AST: 16 units/L (ref 0–37)
Albumin: 3.6 g/dL (ref 3.5–5.2)
Alkaline Phosphatase: 130 units/L — ABNORMAL HIGH (ref 39–117)
Basophils Absolute: 0 10*3/uL (ref 0.0–0.1)
Basophils Relative: 0 % (ref 0–1)
Ferritin: 34 ng/mL (ref 10–291)
Glucose, Bld: 112 mg/dL — ABNORMAL HIGH (ref 70–99)
Hemoglobin: 11.2 g/dL — ABNORMAL LOW (ref 12.0–15.0)
Lymphocytes Relative: 19 % (ref 12–46)
MCHC: 31.7 g/dL (ref 30.0–36.0)
Monocytes Absolute: 0.4 10*3/uL (ref 0.1–1.0)
Neutro Abs: 6.4 10*3/uL (ref 1.7–7.7)
Neutrophils Relative %: 76 % (ref 43–77)
Platelets: 403 10*3/uL — ABNORMAL HIGH (ref 150–400)
Potassium: 4.5 meq/L (ref 3.5–5.3)
RDW: 17.6 % — ABNORMAL HIGH (ref 11.5–15.5)
Sodium: 138 meq/L (ref 135–145)
Total Bilirubin: 0.3 mg/dL (ref 0.3–1.2)
Total Protein: 7.5 g/dL (ref 6.0–8.3)
UIBC: 234 ug/dL
Vitamin B-12: 372 pg/mL (ref 211–911)

## 2009-10-27 ENCOUNTER — Ambulatory Visit: Payer: Self-pay | Admitting: Internal Medicine

## 2009-10-27 LAB — CONVERTED CEMR LAB: RBC Folate: 694 ng/mL — ABNORMAL HIGH (ref 180–600)

## 2009-11-01 ENCOUNTER — Encounter (INDEPENDENT_AMBULATORY_CARE_PROVIDER_SITE_OTHER): Payer: Self-pay | Admitting: Internal Medicine

## 2009-11-01 ENCOUNTER — Ambulatory Visit: Payer: Self-pay | Admitting: Infectious Diseases

## 2009-11-01 ENCOUNTER — Inpatient Hospital Stay (HOSPITAL_COMMUNITY): Admission: EM | Admit: 2009-11-01 | Discharge: 2009-11-04 | Payer: Self-pay | Admitting: Emergency Medicine

## 2009-11-03 ENCOUNTER — Telehealth (INDEPENDENT_AMBULATORY_CARE_PROVIDER_SITE_OTHER): Payer: Self-pay | Admitting: Internal Medicine

## 2009-11-04 ENCOUNTER — Telehealth (INDEPENDENT_AMBULATORY_CARE_PROVIDER_SITE_OTHER): Payer: Self-pay | Admitting: Internal Medicine

## 2009-11-04 ENCOUNTER — Encounter (INDEPENDENT_AMBULATORY_CARE_PROVIDER_SITE_OTHER): Payer: Self-pay | Admitting: Internal Medicine

## 2009-11-05 ENCOUNTER — Ambulatory Visit: Payer: Self-pay | Admitting: Internal Medicine

## 2009-11-05 ENCOUNTER — Telehealth (INDEPENDENT_AMBULATORY_CARE_PROVIDER_SITE_OTHER): Payer: Self-pay | Admitting: Internal Medicine

## 2009-11-15 ENCOUNTER — Encounter (INDEPENDENT_AMBULATORY_CARE_PROVIDER_SITE_OTHER): Payer: Self-pay | Admitting: Internal Medicine

## 2009-11-26 ENCOUNTER — Ambulatory Visit: Payer: Self-pay | Admitting: Internal Medicine

## 2009-11-26 LAB — CONVERTED CEMR LAB: Blood Glucose, Fingerstick: 150

## 2009-11-29 ENCOUNTER — Ambulatory Visit: Payer: Self-pay | Admitting: Internal Medicine

## 2009-12-20 ENCOUNTER — Telehealth (INDEPENDENT_AMBULATORY_CARE_PROVIDER_SITE_OTHER): Payer: Self-pay | Admitting: Internal Medicine

## 2009-12-22 ENCOUNTER — Telehealth (INDEPENDENT_AMBULATORY_CARE_PROVIDER_SITE_OTHER): Payer: Self-pay | Admitting: Internal Medicine

## 2009-12-28 ENCOUNTER — Ambulatory Visit: Payer: Self-pay | Admitting: Internal Medicine

## 2009-12-28 DIAGNOSIS — R110 Nausea: Secondary | ICD-10-CM

## 2009-12-28 DIAGNOSIS — J309 Allergic rhinitis, unspecified: Secondary | ICD-10-CM | POA: Insufficient documentation

## 2010-01-03 ENCOUNTER — Emergency Department (HOSPITAL_COMMUNITY): Admission: EM | Admit: 2010-01-03 | Discharge: 2010-01-03 | Payer: Self-pay | Admitting: Emergency Medicine

## 2010-01-03 DIAGNOSIS — S99919A Unspecified injury of unspecified ankle, initial encounter: Secondary | ICD-10-CM

## 2010-01-03 DIAGNOSIS — S99929A Unspecified injury of unspecified foot, initial encounter: Secondary | ICD-10-CM

## 2010-01-03 DIAGNOSIS — S8990XA Unspecified injury of unspecified lower leg, initial encounter: Secondary | ICD-10-CM

## 2010-01-10 IMAGING — CR DG HIP (WITH OR WITHOUT PELVIS) 2-3V*L*
3 series · 3 of 3 positions shown · non-contrast
Comparison: None.

CLINICAL DATA: 43-year-old female with left hip pain.  No known
trauma.

LEFT HIP - COMPLETE 2+ VIEW

[t pelvis a.p.]
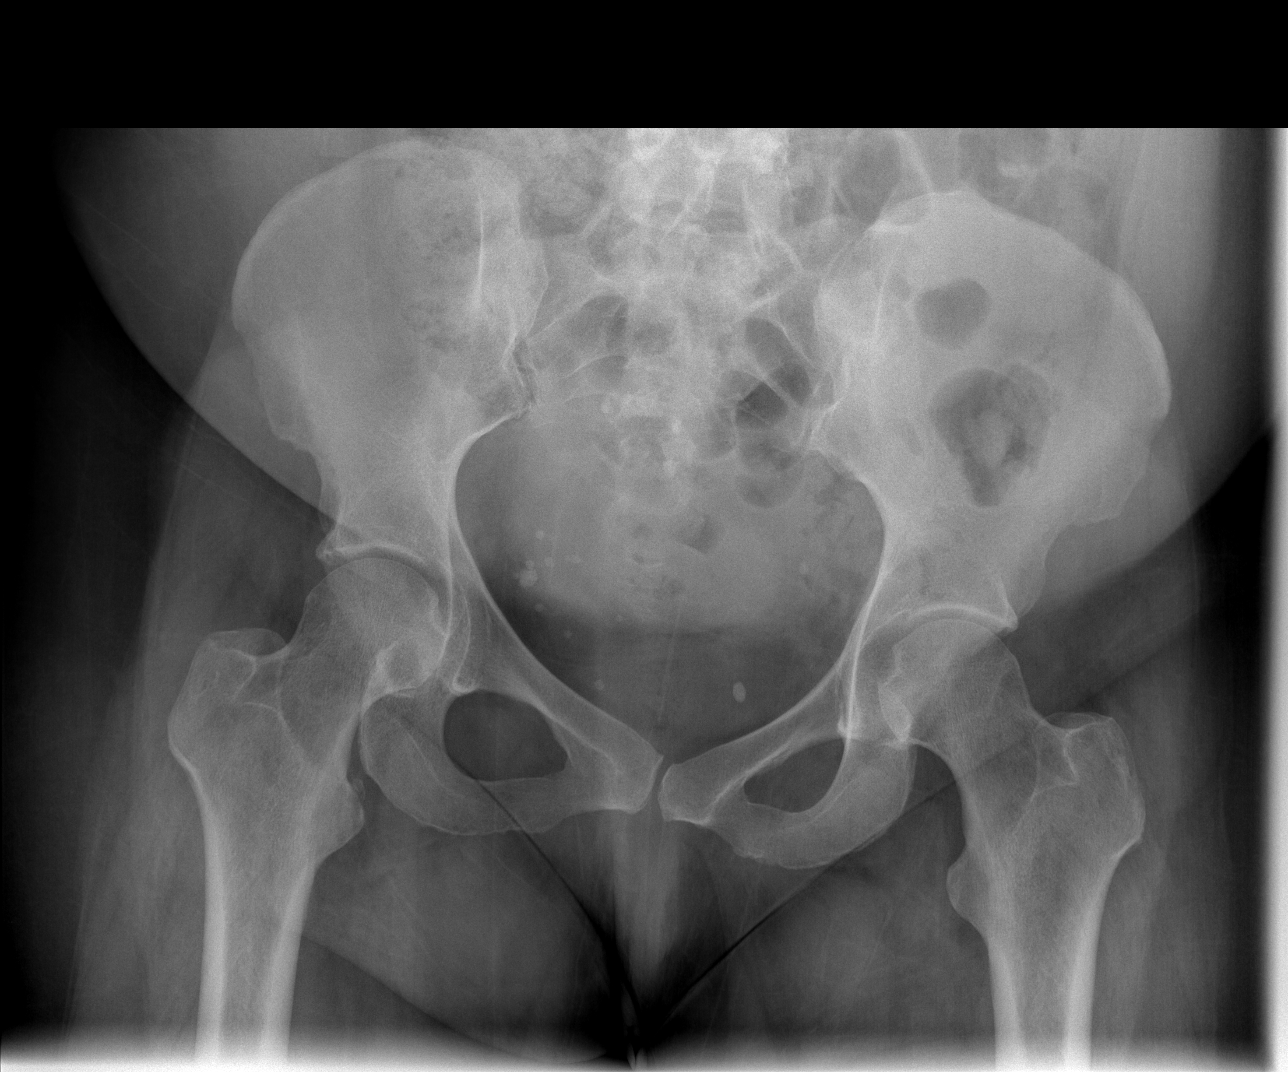

[t hip ap left]
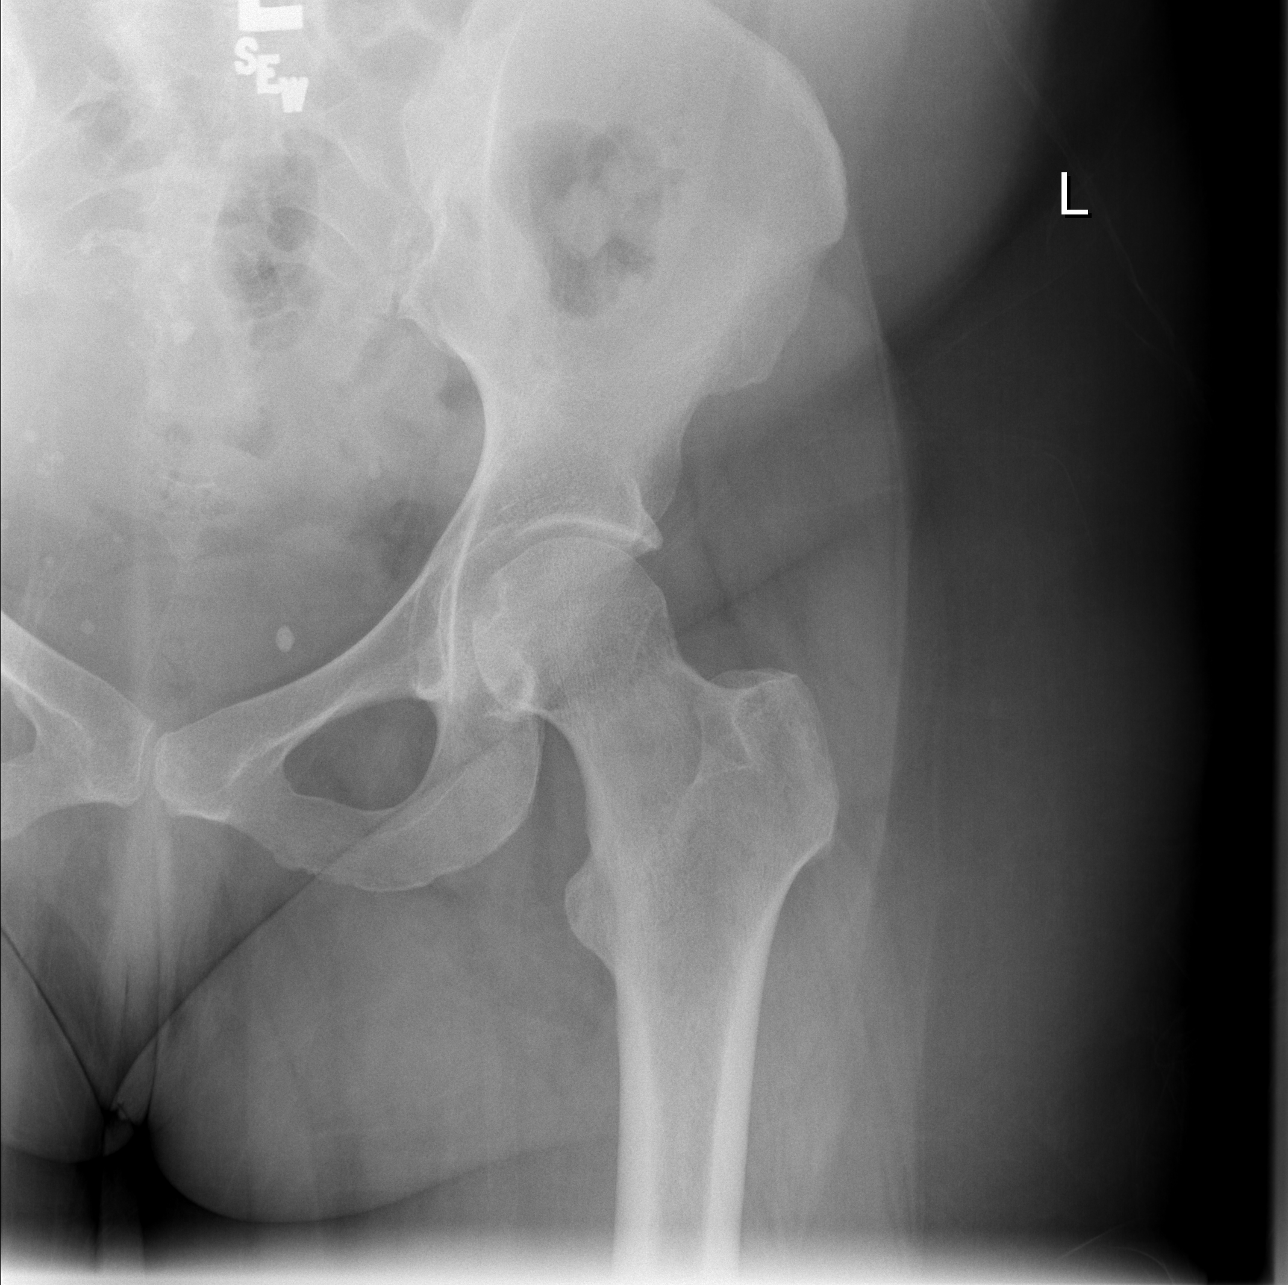

[t hip frog leg left]
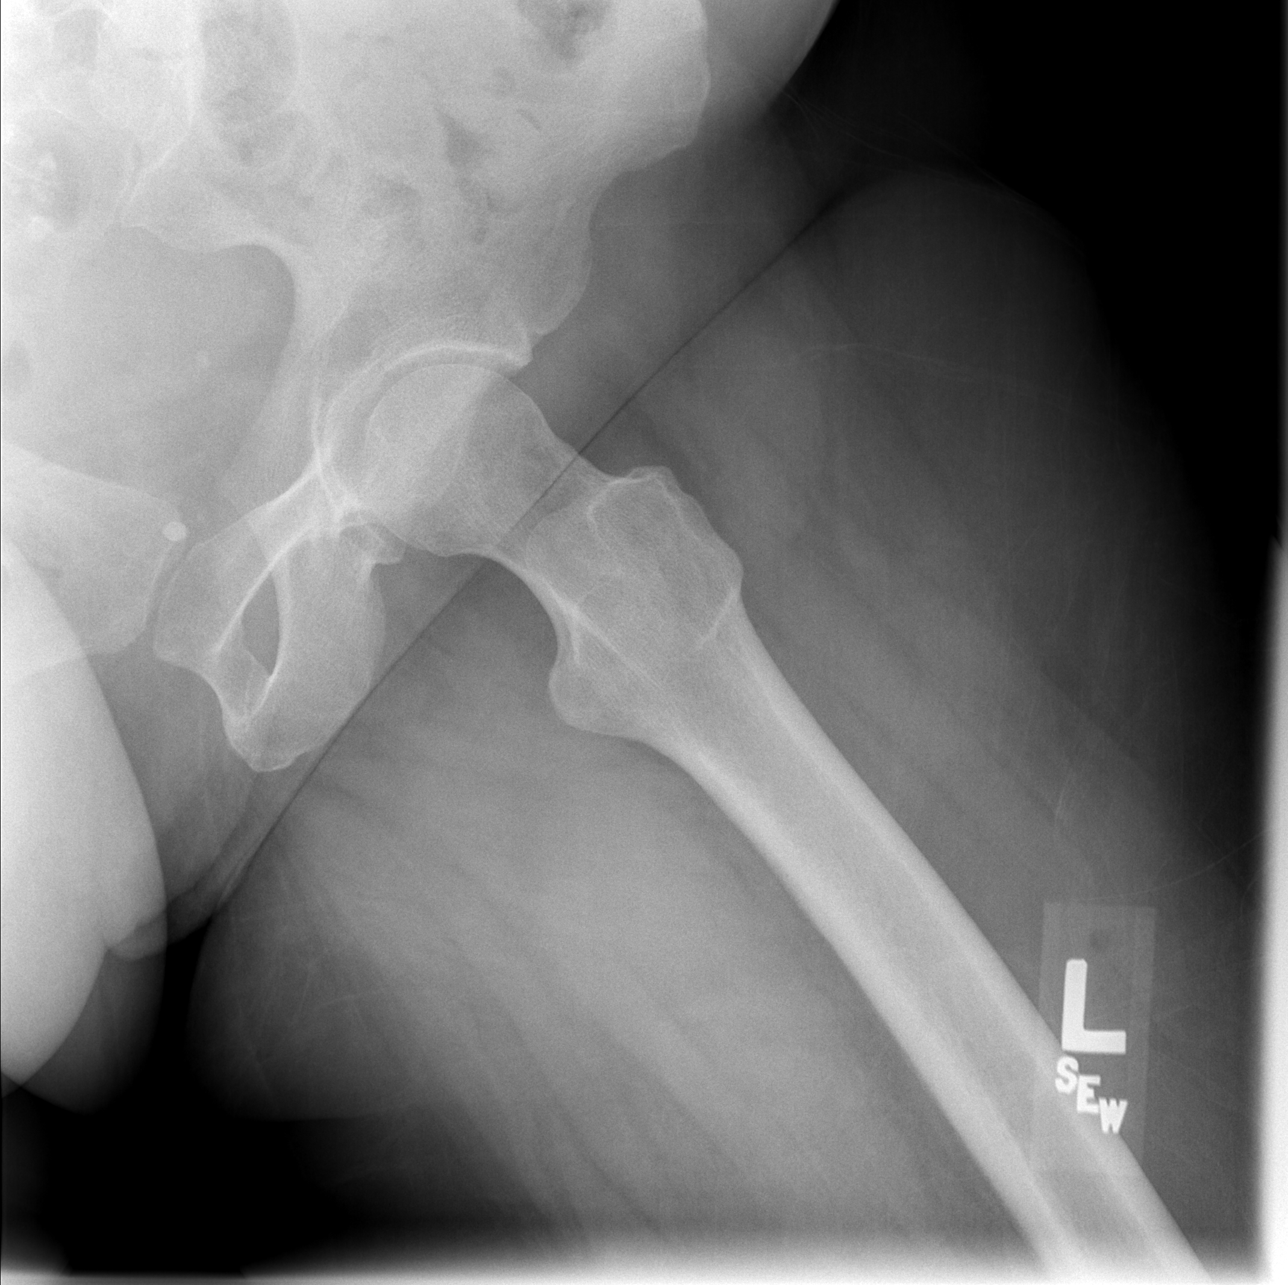

[3 of 3 positions shown; findings below may reference images not displayed]

FINDINGS: Femoral heads are normally located.  Proximal left femur
appears intact. Bone mineralization is within normal limits. Pelvic
phleboliths.  Pelvis appears intact.
IMPRESSION: No acute fracture or dislocation identified about the left hip or
pelvis.

## 2010-01-11 ENCOUNTER — Ambulatory Visit: Payer: Self-pay | Admitting: Internal Medicine

## 2010-01-12 ENCOUNTER — Encounter (INDEPENDENT_AMBULATORY_CARE_PROVIDER_SITE_OTHER): Payer: Self-pay | Admitting: Internal Medicine

## 2010-01-17 ENCOUNTER — Telehealth (INDEPENDENT_AMBULATORY_CARE_PROVIDER_SITE_OTHER): Payer: Self-pay | Admitting: Internal Medicine

## 2010-01-17 ENCOUNTER — Encounter (INDEPENDENT_AMBULATORY_CARE_PROVIDER_SITE_OTHER): Payer: Self-pay | Admitting: Internal Medicine

## 2010-01-20 ENCOUNTER — Ambulatory Visit: Payer: Self-pay | Admitting: Internal Medicine

## 2010-01-25 ENCOUNTER — Telehealth (INDEPENDENT_AMBULATORY_CARE_PROVIDER_SITE_OTHER): Payer: Self-pay | Admitting: Internal Medicine

## 2010-01-26 ENCOUNTER — Encounter: Admission: RE | Admit: 2010-01-26 | Discharge: 2010-04-26 | Payer: Self-pay | Admitting: Internal Medicine

## 2010-02-01 ENCOUNTER — Encounter (INDEPENDENT_AMBULATORY_CARE_PROVIDER_SITE_OTHER): Payer: Self-pay | Admitting: Internal Medicine

## 2010-02-02 ENCOUNTER — Ambulatory Visit: Payer: Self-pay | Admitting: Internal Medicine

## 2010-02-02 LAB — CONVERTED CEMR LAB
AST: 12 units/L (ref 0–37)
Alkaline Phosphatase: 131 units/L — ABNORMAL HIGH (ref 39–117)
Basophils Absolute: 0 10*3/uL (ref 0.0–0.1)
Basophils Relative: 0 % (ref 0–1)
Blood Glucose, Fingerstick: 256
Glucose, Bld: 244 mg/dL — ABNORMAL HIGH (ref 70–99)
Lymphocytes Relative: 19 % (ref 12–46)
MCHC: 31.4 g/dL (ref 30.0–36.0)
Neutro Abs: 7.7 10*3/uL (ref 1.7–7.7)
Neutrophils Relative %: 74 % (ref 43–77)
Platelets: 508 10*3/uL — ABNORMAL HIGH (ref 150–400)
RDW: 18.7 % — ABNORMAL HIGH (ref 11.5–15.5)
Sodium: 135 meq/L (ref 135–145)
Total Bilirubin: 0.3 mg/dL (ref 0.3–1.2)
Total Protein: 7.3 g/dL (ref 6.0–8.3)

## 2010-02-10 ENCOUNTER — Ambulatory Visit: Payer: Self-pay | Admitting: Internal Medicine

## 2010-02-18 ENCOUNTER — Telehealth (INDEPENDENT_AMBULATORY_CARE_PROVIDER_SITE_OTHER): Payer: Self-pay | Admitting: Internal Medicine

## 2010-02-25 ENCOUNTER — Ambulatory Visit: Payer: Self-pay | Admitting: Internal Medicine

## 2010-02-25 DIAGNOSIS — M25539 Pain in unspecified wrist: Secondary | ICD-10-CM | POA: Insufficient documentation

## 2010-02-25 DIAGNOSIS — M25519 Pain in unspecified shoulder: Secondary | ICD-10-CM

## 2010-02-28 ENCOUNTER — Ambulatory Visit (HOSPITAL_COMMUNITY): Admission: RE | Admit: 2010-02-28 | Discharge: 2010-02-28 | Payer: Self-pay | Admitting: Internal Medicine

## 2010-03-15 ENCOUNTER — Telehealth (INDEPENDENT_AMBULATORY_CARE_PROVIDER_SITE_OTHER): Payer: Self-pay | Admitting: Internal Medicine

## 2010-03-17 ENCOUNTER — Ambulatory Visit: Payer: Self-pay | Admitting: Internal Medicine

## 2010-03-18 ENCOUNTER — Encounter (INDEPENDENT_AMBULATORY_CARE_PROVIDER_SITE_OTHER): Payer: Self-pay | Admitting: Internal Medicine

## 2010-03-18 LAB — CONVERTED CEMR LAB
Alkaline Phosphatase: 140 units/L — ABNORMAL HIGH (ref 39–117)
BUN: 17 mg/dL (ref 6–23)
Eosinophils Absolute: 0.1 10*3/uL (ref 0.0–0.7)
Eosinophils Relative: 1 % (ref 0–5)
Glucose, Bld: 70 mg/dL (ref 70–99)
HCT: 35.3 % — ABNORMAL LOW (ref 36.0–46.0)
Lymphs Abs: 1.2 10*3/uL (ref 0.7–4.0)
MCHC: 30.6 g/dL (ref 30.0–36.0)
MCV: 84.9 fL (ref 78.0–100.0)
Monocytes Relative: 9 % (ref 3–12)
Platelets: 416 10*3/uL — ABNORMAL HIGH (ref 150–400)
Sodium: 139 meq/L (ref 135–145)
Total Bilirubin: 0.5 mg/dL (ref 0.3–1.2)
WBC: 10.2 10*3/uL (ref 4.0–10.5)

## 2010-03-21 ENCOUNTER — Encounter (INDEPENDENT_AMBULATORY_CARE_PROVIDER_SITE_OTHER): Payer: Self-pay | Admitting: Internal Medicine

## 2010-03-21 DIAGNOSIS — D649 Anemia, unspecified: Secondary | ICD-10-CM

## 2010-03-21 LAB — CONVERTED CEMR LAB
Ferritin: 33 ng/mL (ref 10–291)
TIBC: 342 ug/dL (ref 250–470)
UIBC: 260 ug/dL
Vitamin B-12: 433 pg/mL (ref 211–911)

## 2010-03-22 ENCOUNTER — Telehealth (INDEPENDENT_AMBULATORY_CARE_PROVIDER_SITE_OTHER): Payer: Self-pay | Admitting: *Deleted

## 2010-03-22 ENCOUNTER — Encounter (INDEPENDENT_AMBULATORY_CARE_PROVIDER_SITE_OTHER): Payer: Self-pay | Admitting: Internal Medicine

## 2010-03-28 ENCOUNTER — Encounter (INDEPENDENT_AMBULATORY_CARE_PROVIDER_SITE_OTHER): Payer: Self-pay | Admitting: Internal Medicine

## 2010-03-31 ENCOUNTER — Telehealth (INDEPENDENT_AMBULATORY_CARE_PROVIDER_SITE_OTHER): Payer: Self-pay | Admitting: Internal Medicine

## 2010-04-01 ENCOUNTER — Encounter (INDEPENDENT_AMBULATORY_CARE_PROVIDER_SITE_OTHER): Payer: Self-pay | Admitting: Internal Medicine

## 2010-04-02 IMAGING — CR DG CHEST 1V PORT
1 series · 1 of 1 positions shown · non-contrast
Comparison: 01/26/2009.

CLINICAL DATA: Left-sided chest pain for 1 day.  Shortness of
breath and cough.

PORTABLE CHEST - 1 VIEW

[AP]
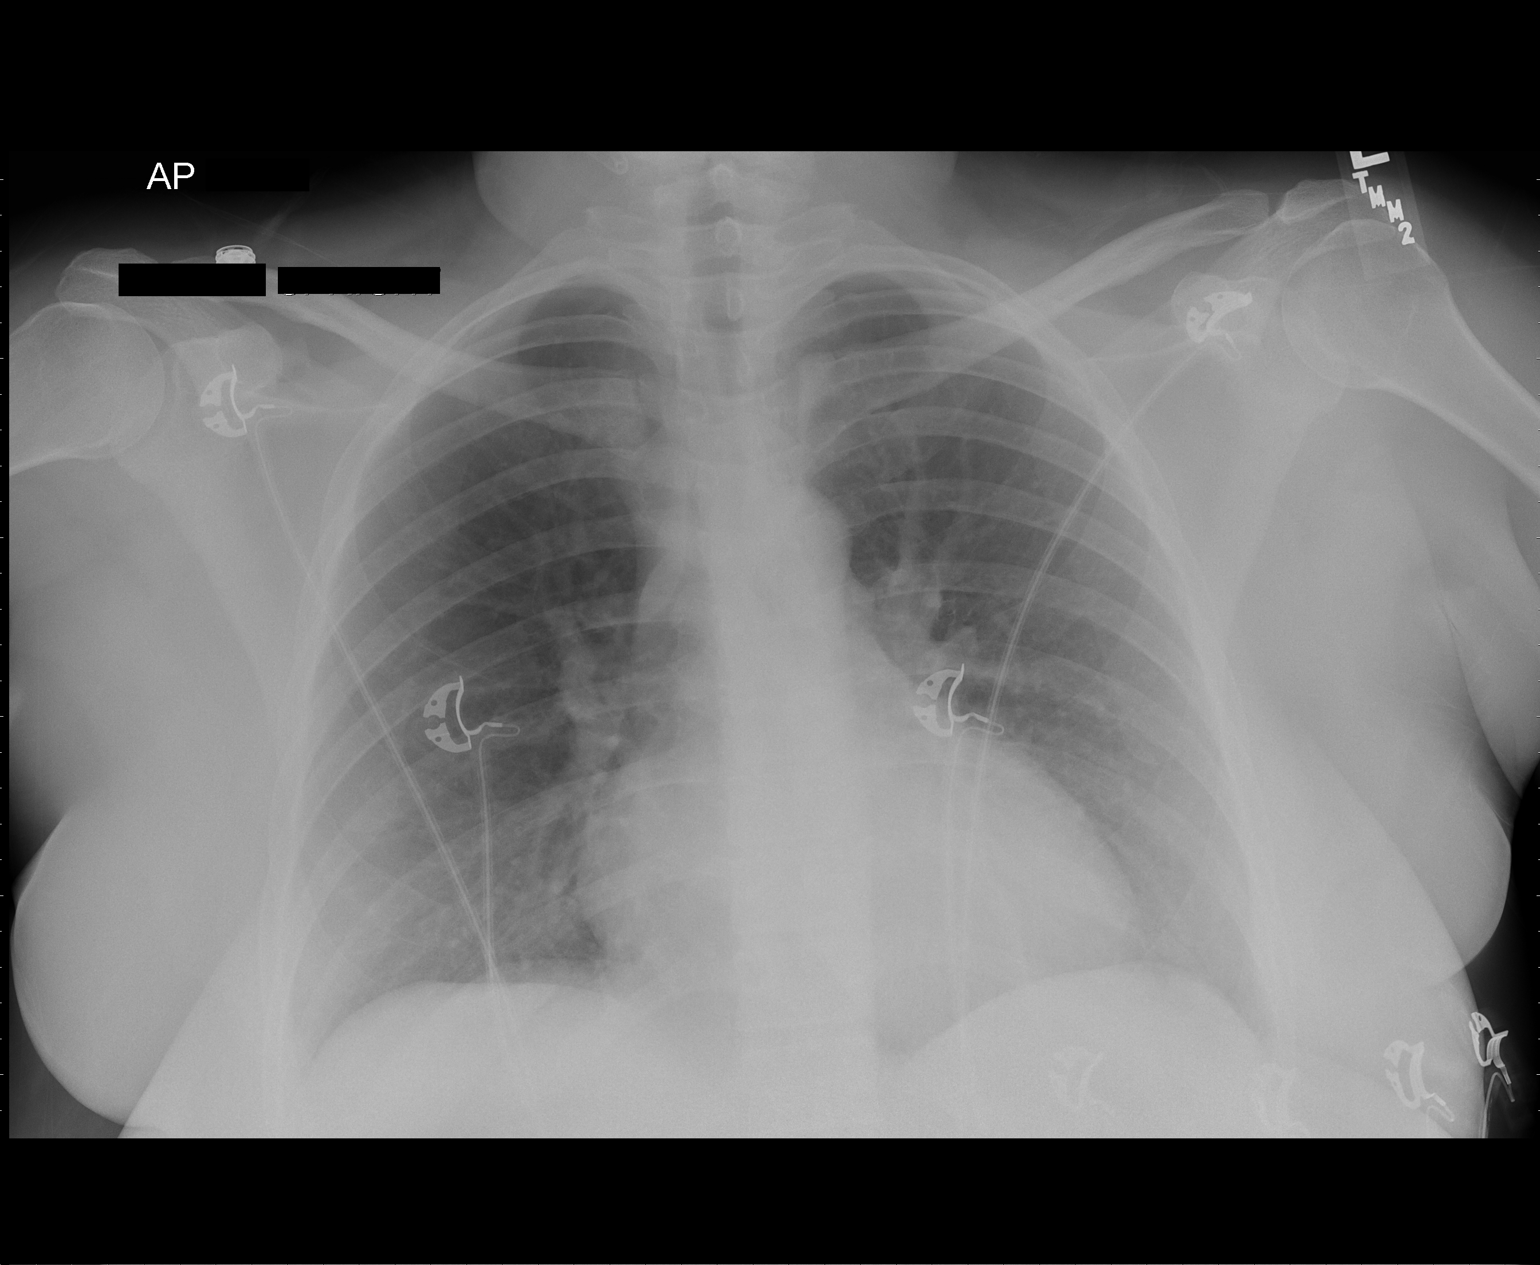

[1 of 1 positions shown; findings below may reference images not displayed]

FINDINGS: The lung volumes are low.  The heart size is normal.
Mild pulmonary vascular congestion is evident.  Minimal bibasilar
atelectasis is present without focal airspace disease elsewhere.
The visualized soft tissues and bony thorax are unremarkable.
IMPRESSION: 1.  Low lung volumes and mild pulmonary vascular congestion without
frank edema.
2.  No focal airspace disease.

## 2010-04-08 ENCOUNTER — Ambulatory Visit: Payer: Self-pay | Admitting: Internal Medicine

## 2010-04-15 ENCOUNTER — Telehealth (INDEPENDENT_AMBULATORY_CARE_PROVIDER_SITE_OTHER): Payer: Self-pay | Admitting: Internal Medicine

## 2010-04-22 ENCOUNTER — Ambulatory Visit: Payer: Self-pay | Admitting: Internal Medicine

## 2010-04-23 ENCOUNTER — Encounter (INDEPENDENT_AMBULATORY_CARE_PROVIDER_SITE_OTHER): Payer: Self-pay | Admitting: Internal Medicine

## 2010-04-23 LAB — CONVERTED CEMR LAB
Basophils Absolute: 0 10*3/uL (ref 0.0–0.1)
Lymphocytes Relative: 25 % (ref 12–46)
Neutro Abs: 5.4 10*3/uL (ref 1.7–7.7)
Platelets: 333 10*3/uL (ref 150–400)
RBC Folate: 755 ng/mL — ABNORMAL HIGH (ref 180–600)
RDW: 19.3 % — ABNORMAL HIGH (ref 11.5–15.5)

## 2010-04-26 IMAGING — CR DG HAND COMPLETE 3+V*R*
3 series · 3 of 3 positions shown · non-contrast
Comparison: None

CLINICAL DATA: Right hand pain and swelling.

RIGHT HAND - COMPLETE 3+ VIEW

[x hand pa right]
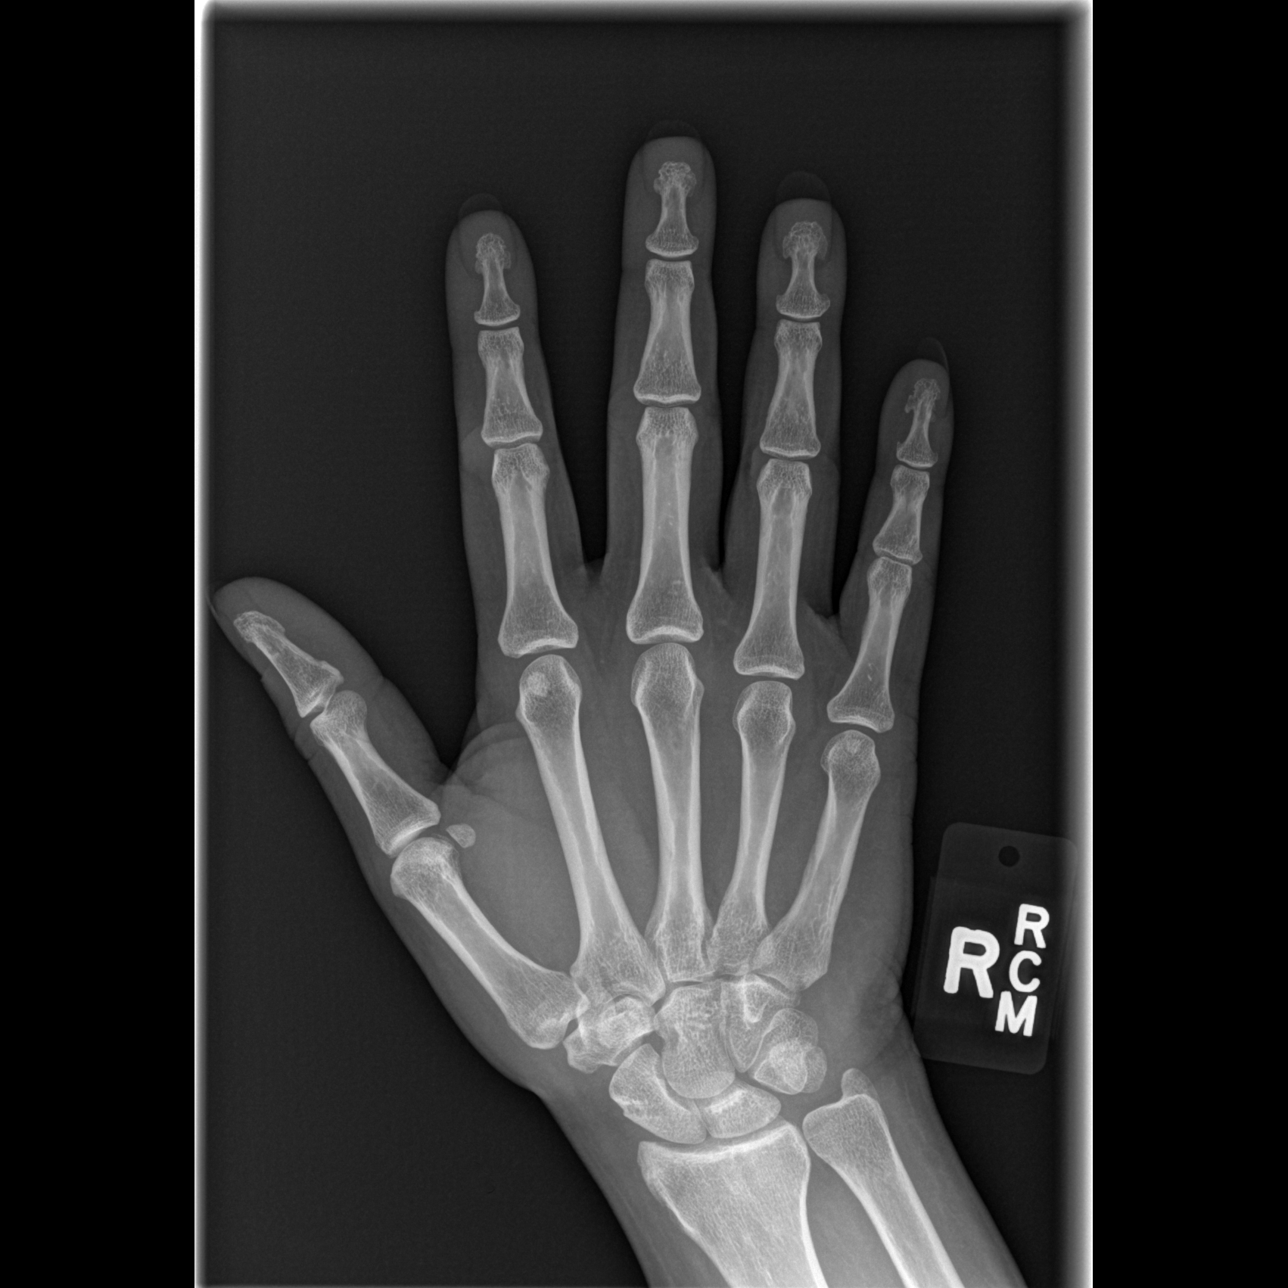

[x hand oblique right]
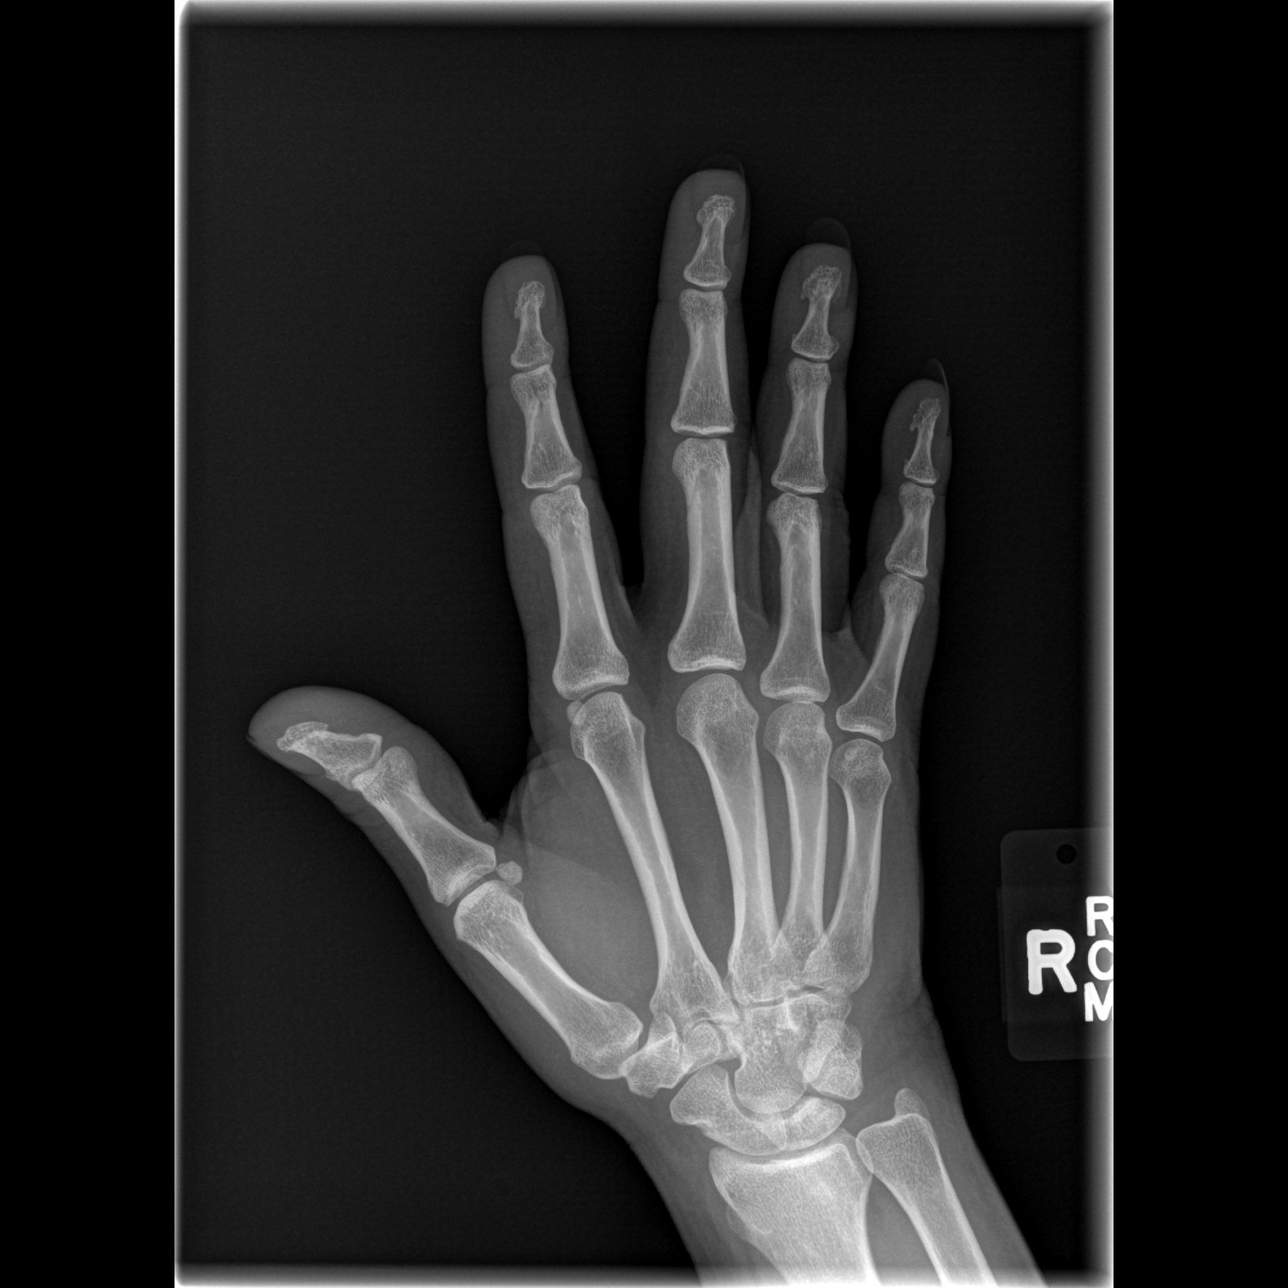

[x hand lat right]
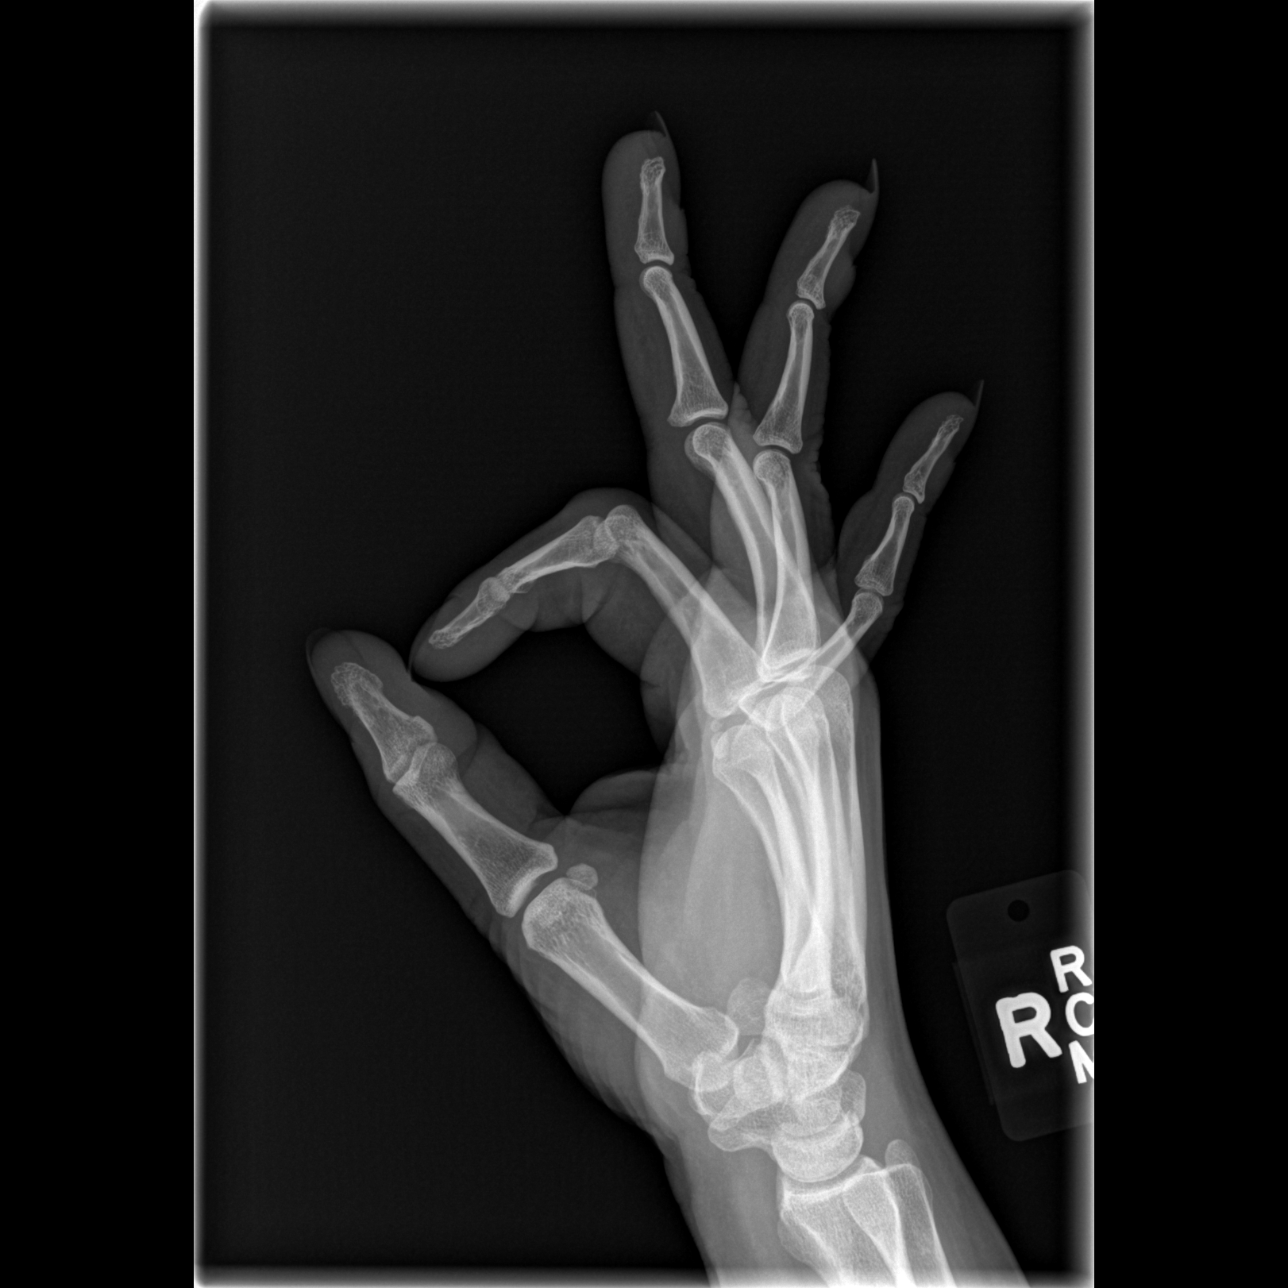

[3 of 3 positions shown; findings below may reference images not displayed]

FINDINGS: The joint spaces are maintained.  No fractures are seen.
No arthropathic changes.  Normal mineralization.
IMPRESSION: No acute bony findings or plain film evidence for inflammatory
arthropathy.

## 2010-04-26 IMAGING — CR DG FOOT COMPLETE 3+V*L*
3 series · 3 of 3 positions shown · non-contrast
Comparison: None

CLINICAL DATA: Left foot pain and swelling.

LEFT FOOT - COMPLETE 3+ VIEW

[t foot ap left]
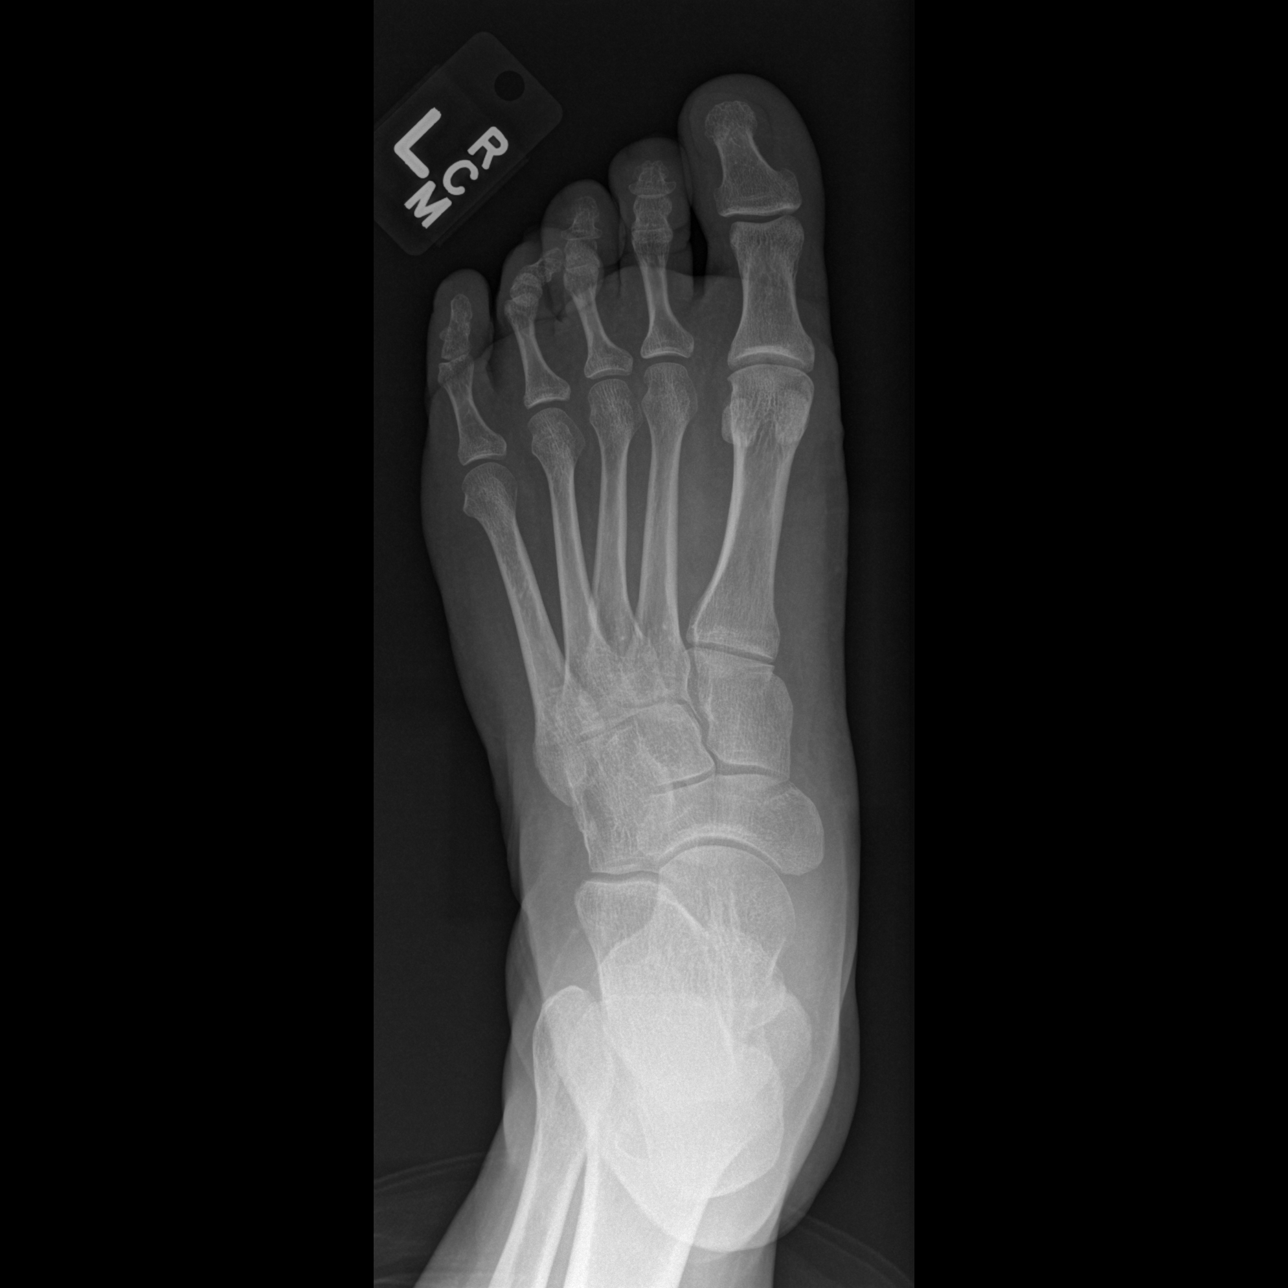

[t foot oblique left]
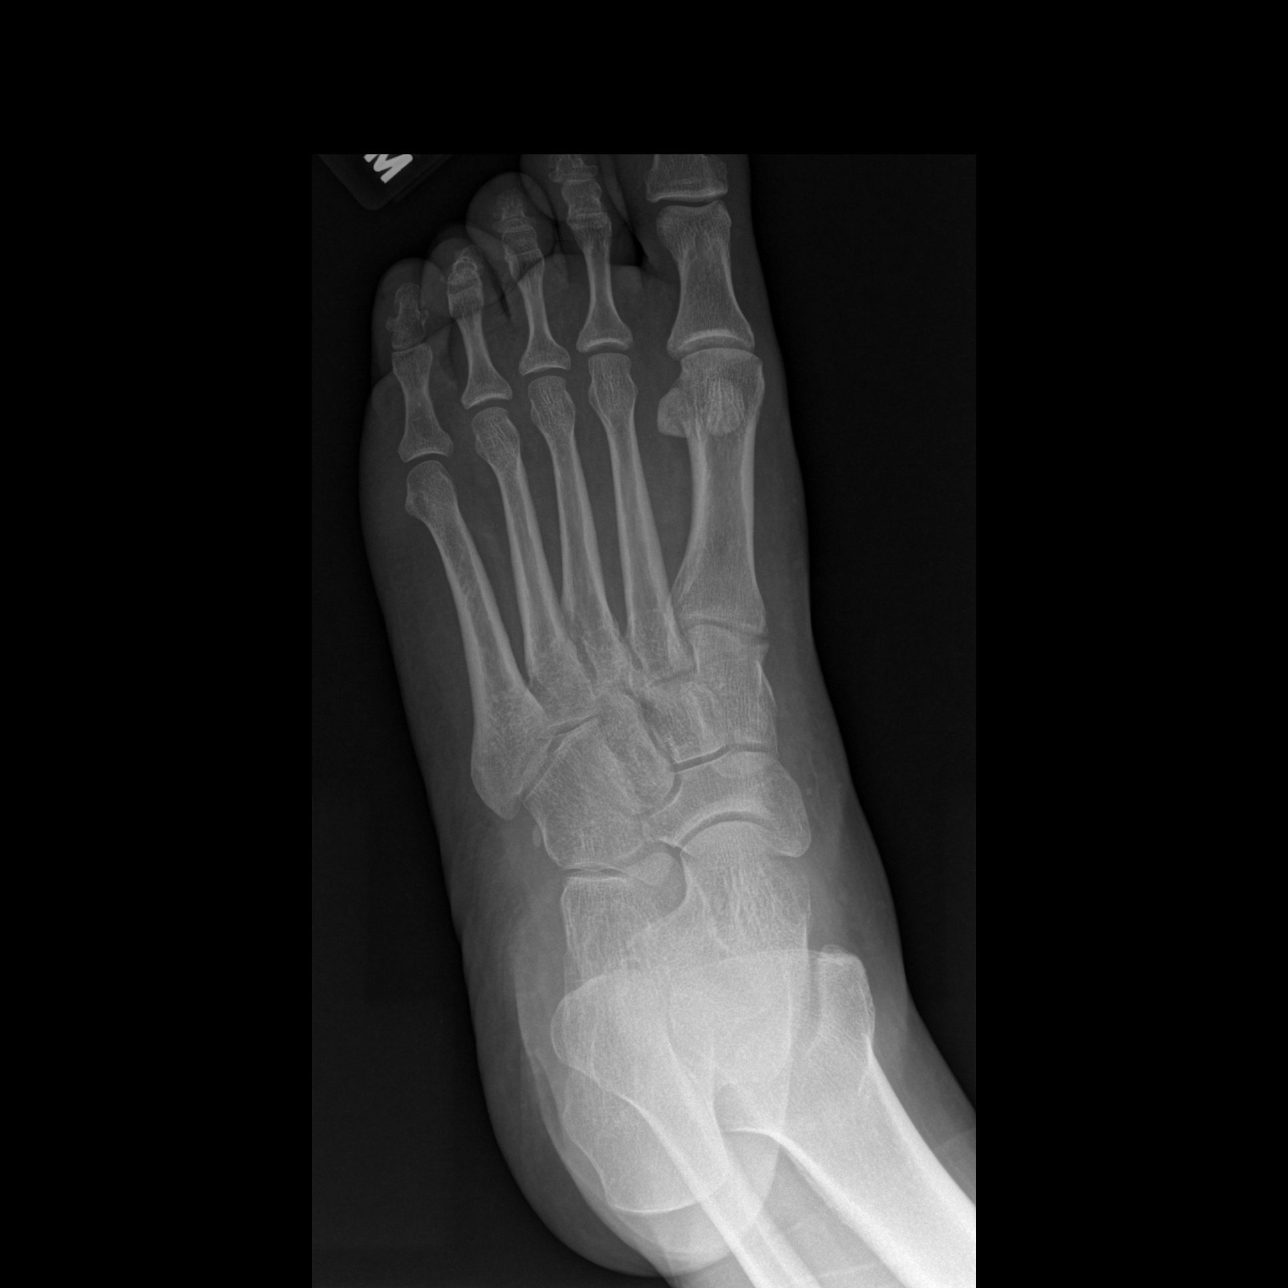

[t foot lat left]
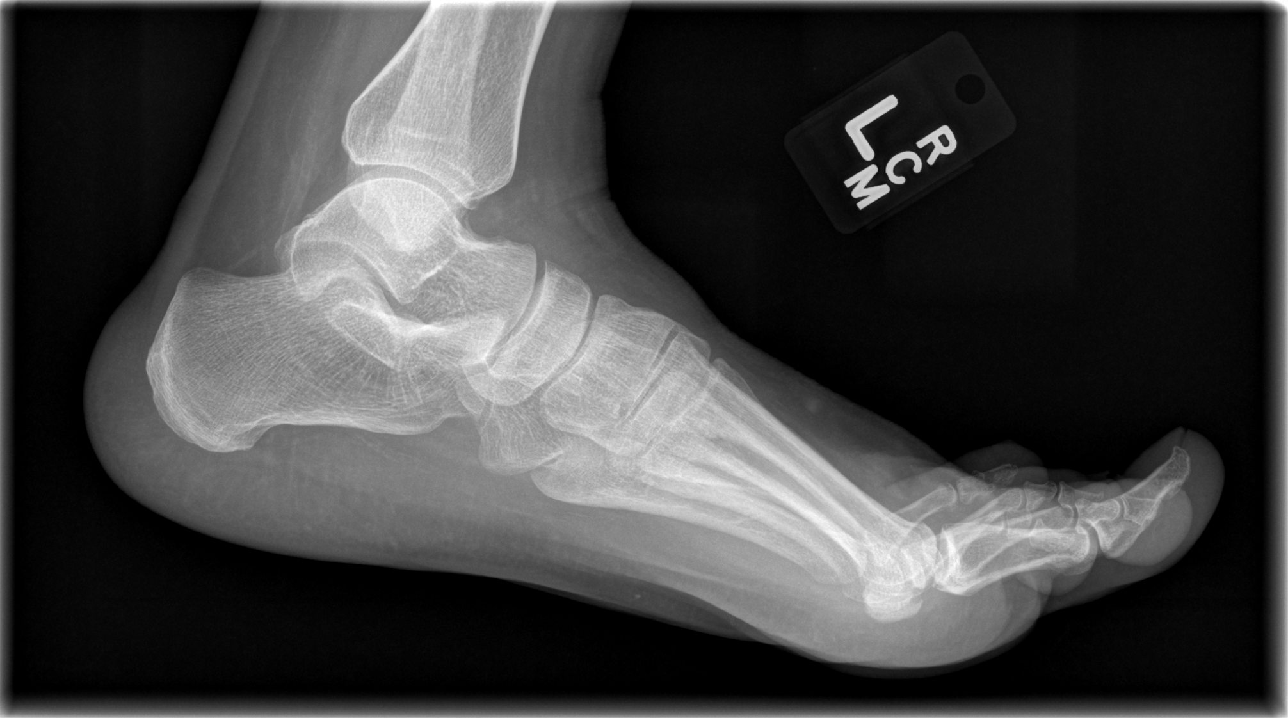

[3 of 3 positions shown; findings below may reference images not displayed]

FINDINGS: The joint spaces are maintained.  No fractures are seen.
No arthropathic changes.  Bone mineralization is relatively normal.
Small vessel calcifications are noted.
IMPRESSION: No acute bony findings or arthropathic changes.

## 2010-04-26 IMAGING — CR DG FOOT COMPLETE 3+V*R*
3 series · 3 of 3 positions shown · non-contrast
Comparison: None

CLINICAL DATA: Right foot pain and swelling.

RIGHT FOOT COMPLETE - 3+ VIEW

[t foot ap right]
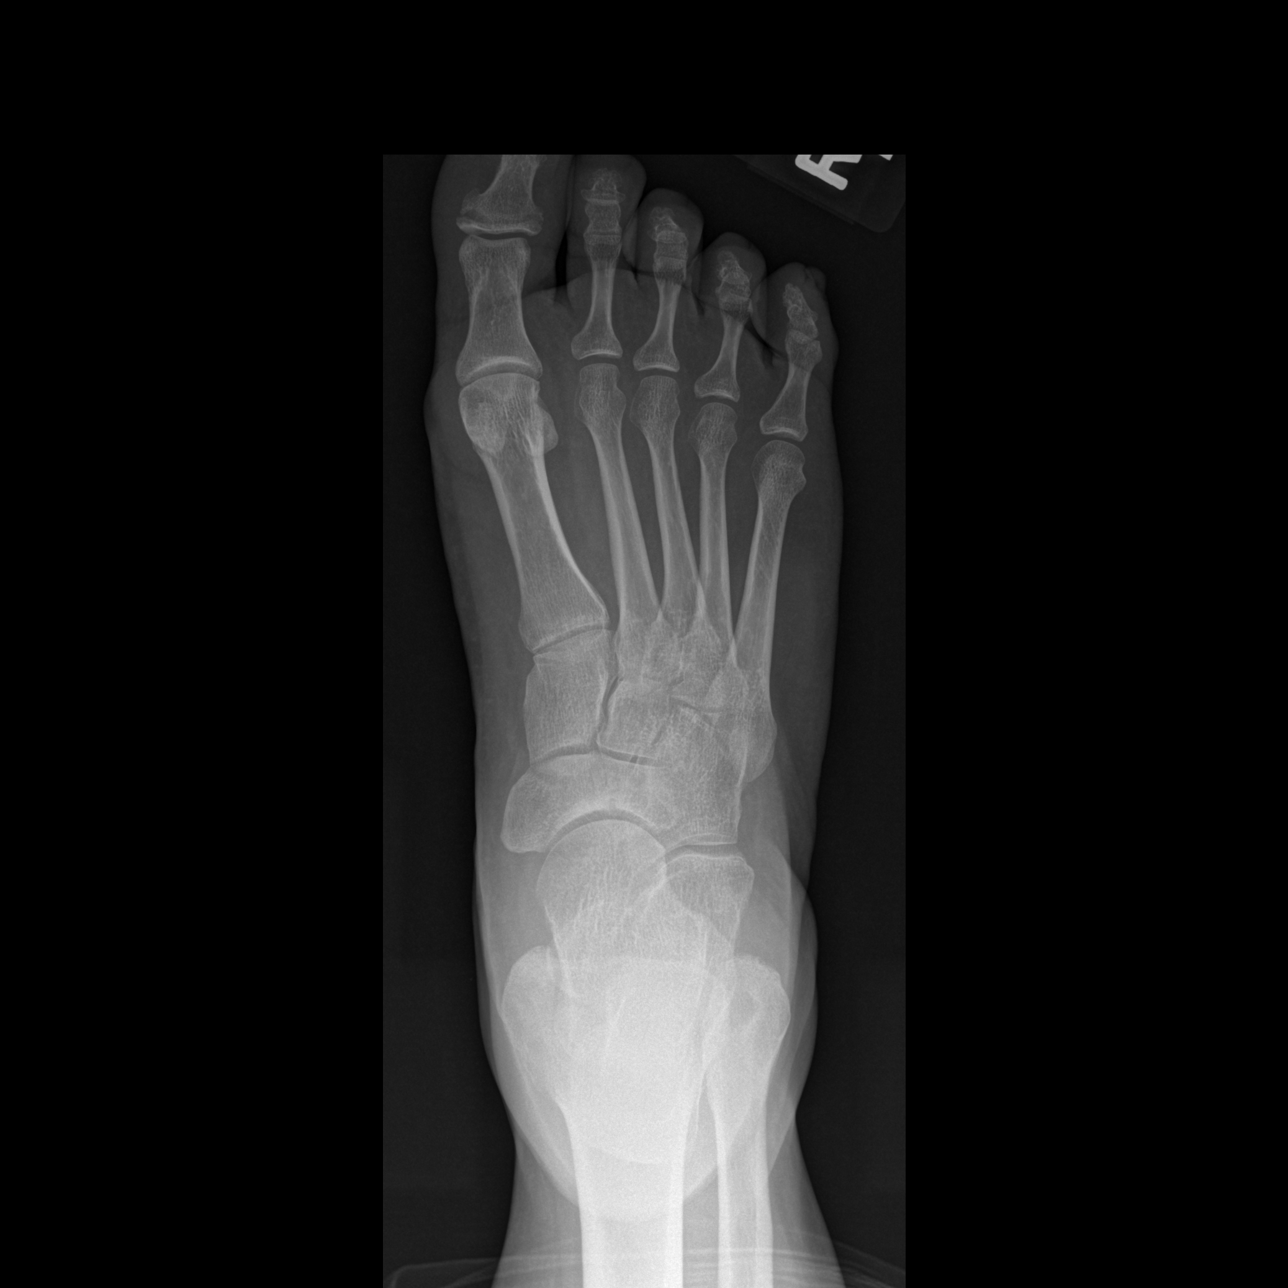

[t foot oblique right]
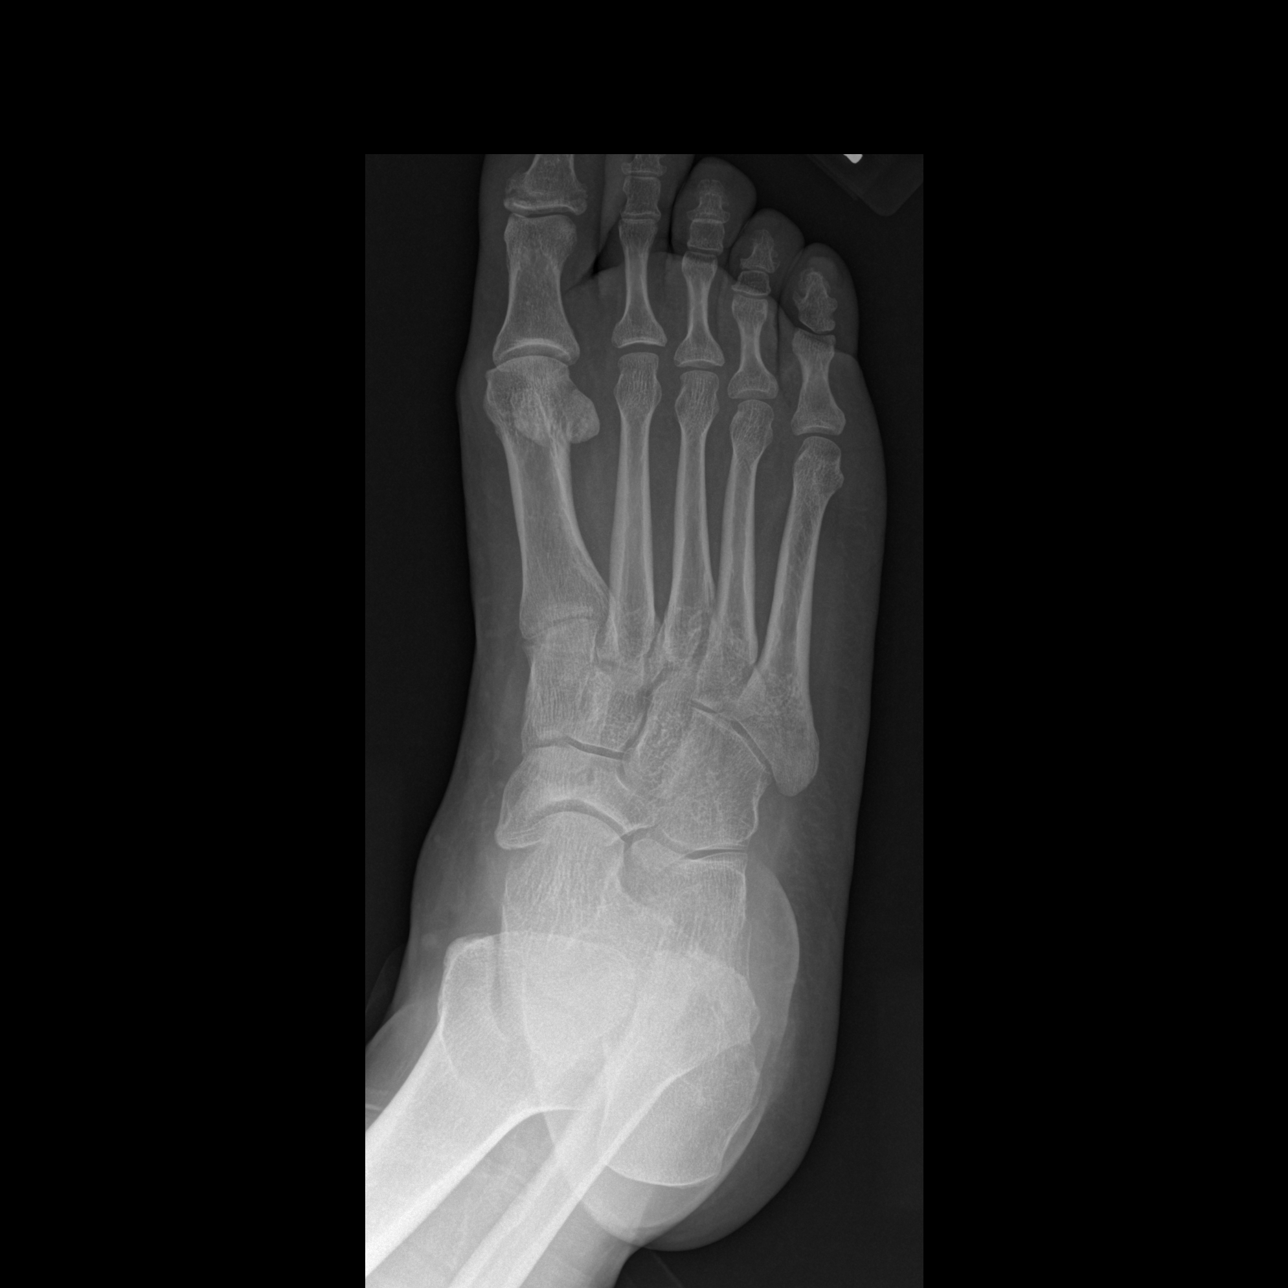

[t foot lat right]
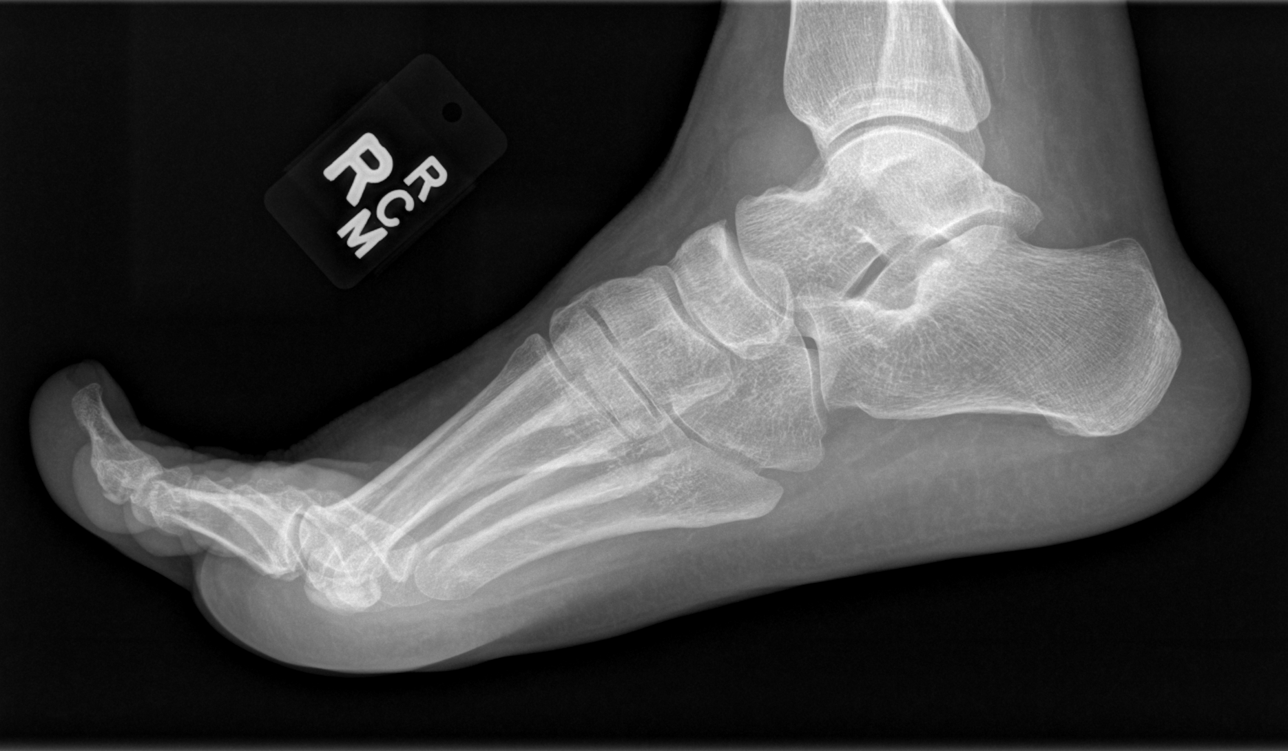

[3 of 3 positions shown; findings below may reference images not displayed]

FINDINGS: The joint spaces are maintained.  No fractures are seen.
No arthropathic changes.  The bone mineralization is relatively
normal.
IMPRESSION: No acute bony findings or arthropathic changes.

## 2010-04-26 IMAGING — CR DG HAND COMPLETE 3+V*L*
3 series · 3 of 3 positions shown · non-contrast
Comparison: None

CLINICAL DATA: Left hand swelling and pain.

LEFT HAND - COMPLETE 3+ VIEW

[x hand pa left]
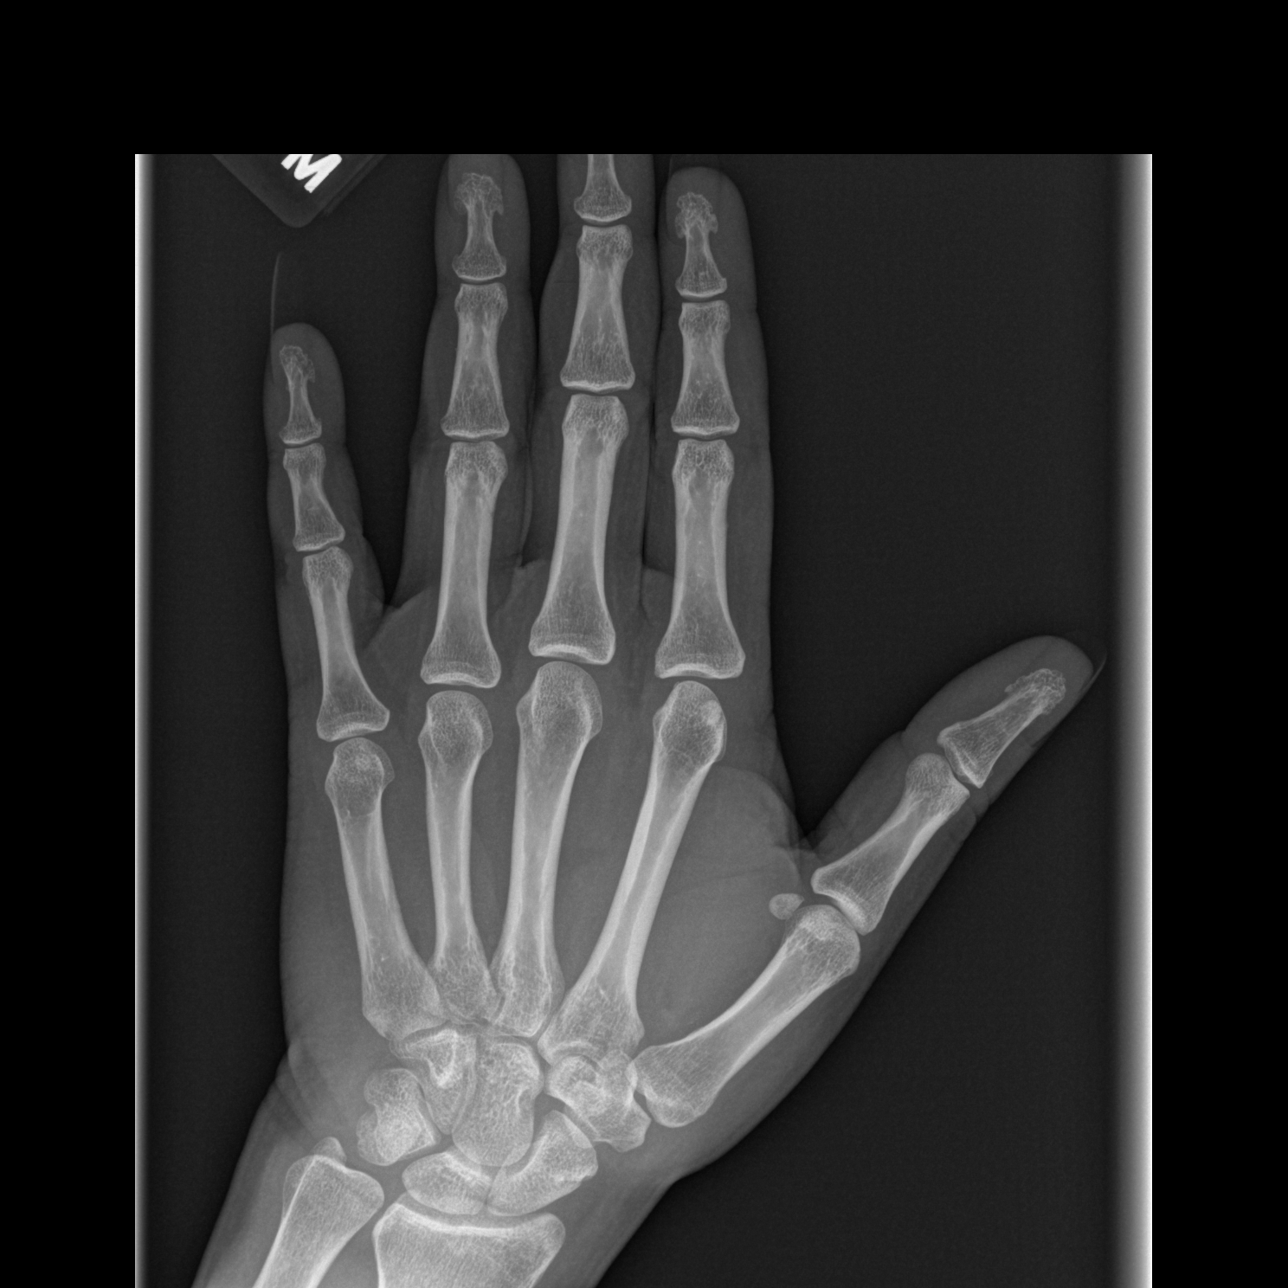

[x hand oblique left]
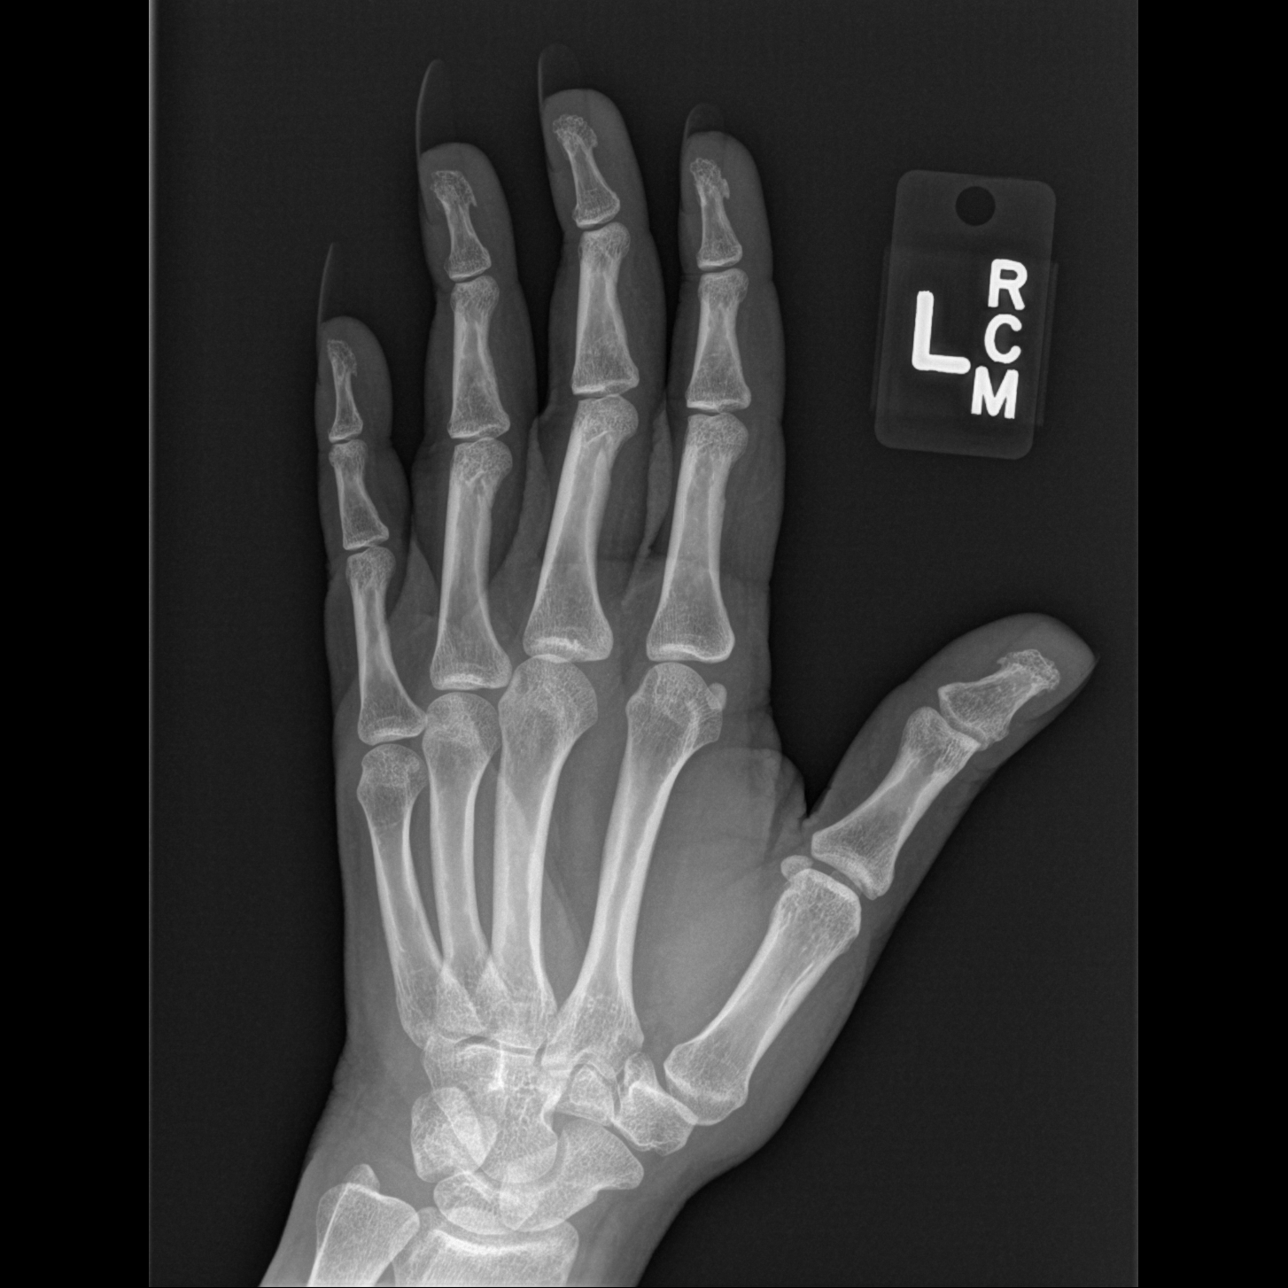

[x hand lat left]
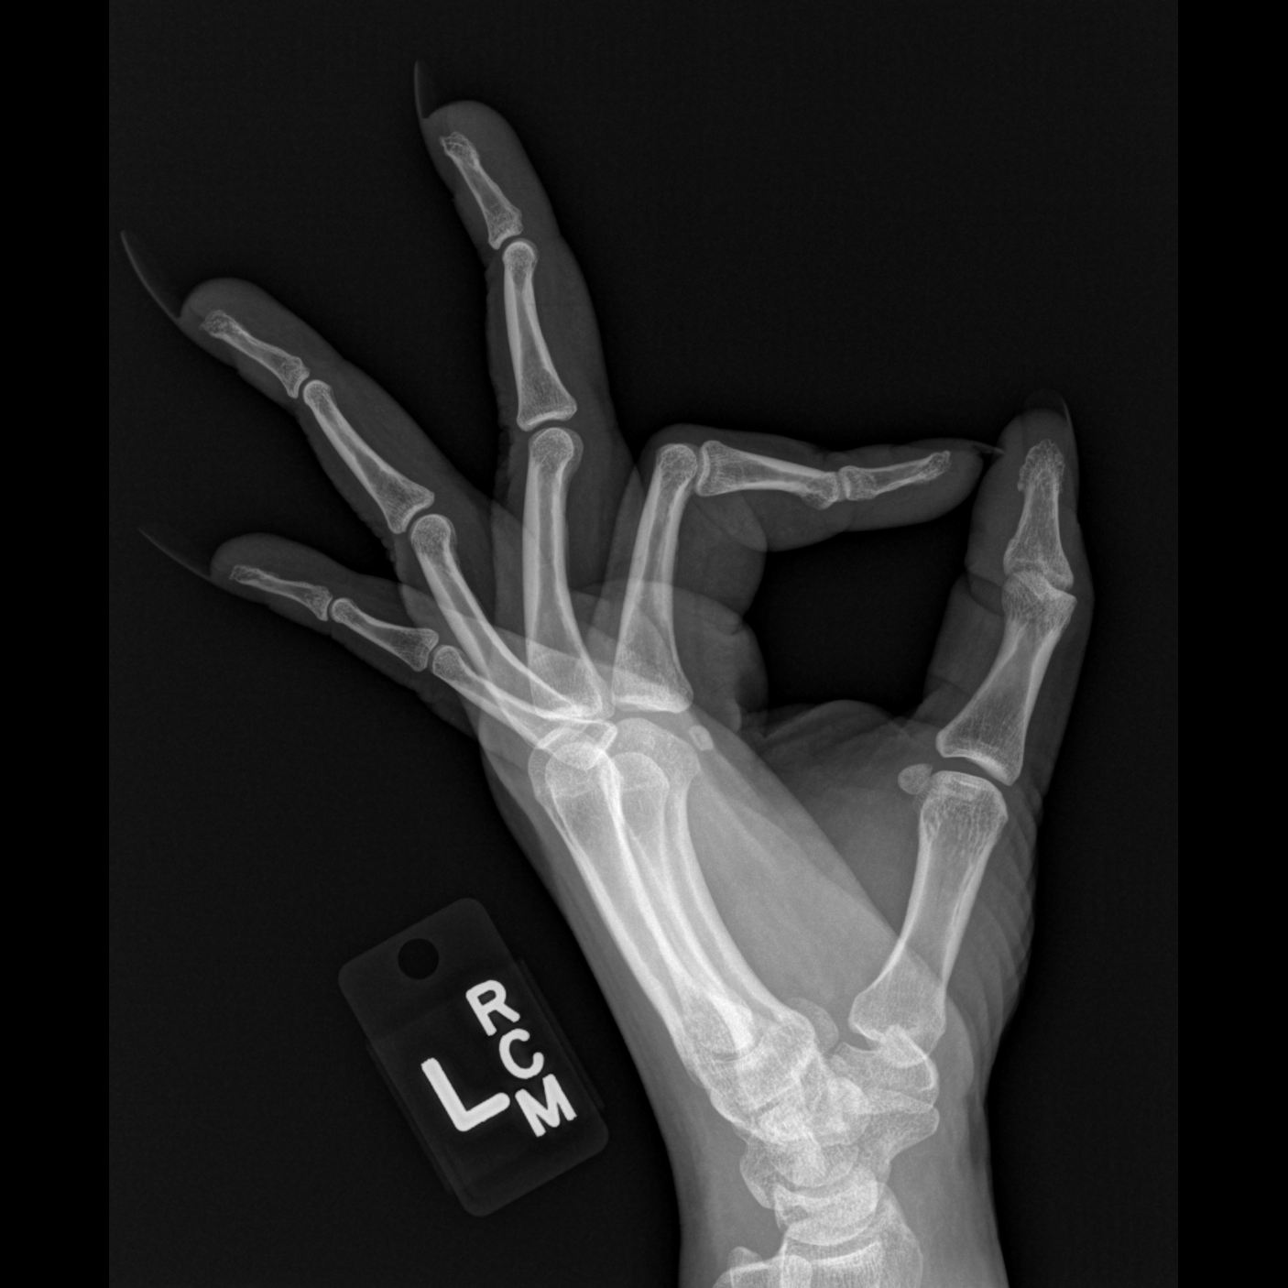

[3 of 3 positions shown; findings below may reference images not displayed]

FINDINGS: The joint spaces are maintained.  The mineralization is
normal.  No acute bony findings.  No arthropathic changes are
noted.
IMPRESSION: No acute bony findings or plain film evidence of an inflammatory
arthropathy.

## 2010-04-27 ENCOUNTER — Encounter (INDEPENDENT_AMBULATORY_CARE_PROVIDER_SITE_OTHER): Payer: Self-pay | Admitting: Internal Medicine

## 2010-04-28 ENCOUNTER — Ambulatory Visit: Payer: Self-pay | Admitting: Internal Medicine

## 2010-04-28 ENCOUNTER — Encounter: Admission: RE | Admit: 2010-04-28 | Discharge: 2010-05-04 | Payer: Self-pay | Admitting: Internal Medicine

## 2010-04-28 LAB — CONVERTED CEMR LAB: Hgb A1c MFr Bld: 7.2 %

## 2010-04-29 LAB — CONVERTED CEMR LAB
ALT: <8 units/L (ref 0–35)
AST: 18 units/L (ref 0–37)
Albumin: 4 g/dL (ref 3.5–5.2)
Alkaline Phosphatase: 125 units/L — ABNORMAL HIGH (ref 39–117)
Eosinophils Absolute: 0.1 10*3/uL (ref 0.0–0.7)
Glucose, Bld: 176 mg/dL — ABNORMAL HIGH (ref 70–99)
Lymphocytes Relative: 25 % (ref 12–46)
Lymphs Abs: 2.4 10*3/uL (ref 0.7–4.0)
MCV: 83.9 fL (ref 78.0–100.0)
Neutrophils Relative %: 67 % (ref 43–77)
Platelets: 396 10*3/uL (ref 150–400)
Potassium: 4.7 meq/L (ref 3.5–5.3)
Sodium: 135 meq/L (ref 135–145)
Total Protein: 7.3 g/dL (ref 6.0–8.3)
WBC: 9.6 10*3/uL (ref 4.0–10.5)

## 2010-05-02 ENCOUNTER — Telehealth (INDEPENDENT_AMBULATORY_CARE_PROVIDER_SITE_OTHER): Payer: Self-pay | Admitting: Internal Medicine

## 2010-05-04 ENCOUNTER — Encounter (INDEPENDENT_AMBULATORY_CARE_PROVIDER_SITE_OTHER): Payer: Self-pay | Admitting: Internal Medicine

## 2010-05-09 ENCOUNTER — Telehealth (INDEPENDENT_AMBULATORY_CARE_PROVIDER_SITE_OTHER): Payer: Self-pay | Admitting: Internal Medicine

## 2010-05-09 IMAGING — CR DG CHEST 2V
2 series · 2 of 2 positions shown · non-contrast
Comparison: August 31, 2009

CLINICAL DATA: Recent pneumonia; rheumatoid arthritis; history of
cardiac stent

CHEST - 2 VIEW

[w chest pa]
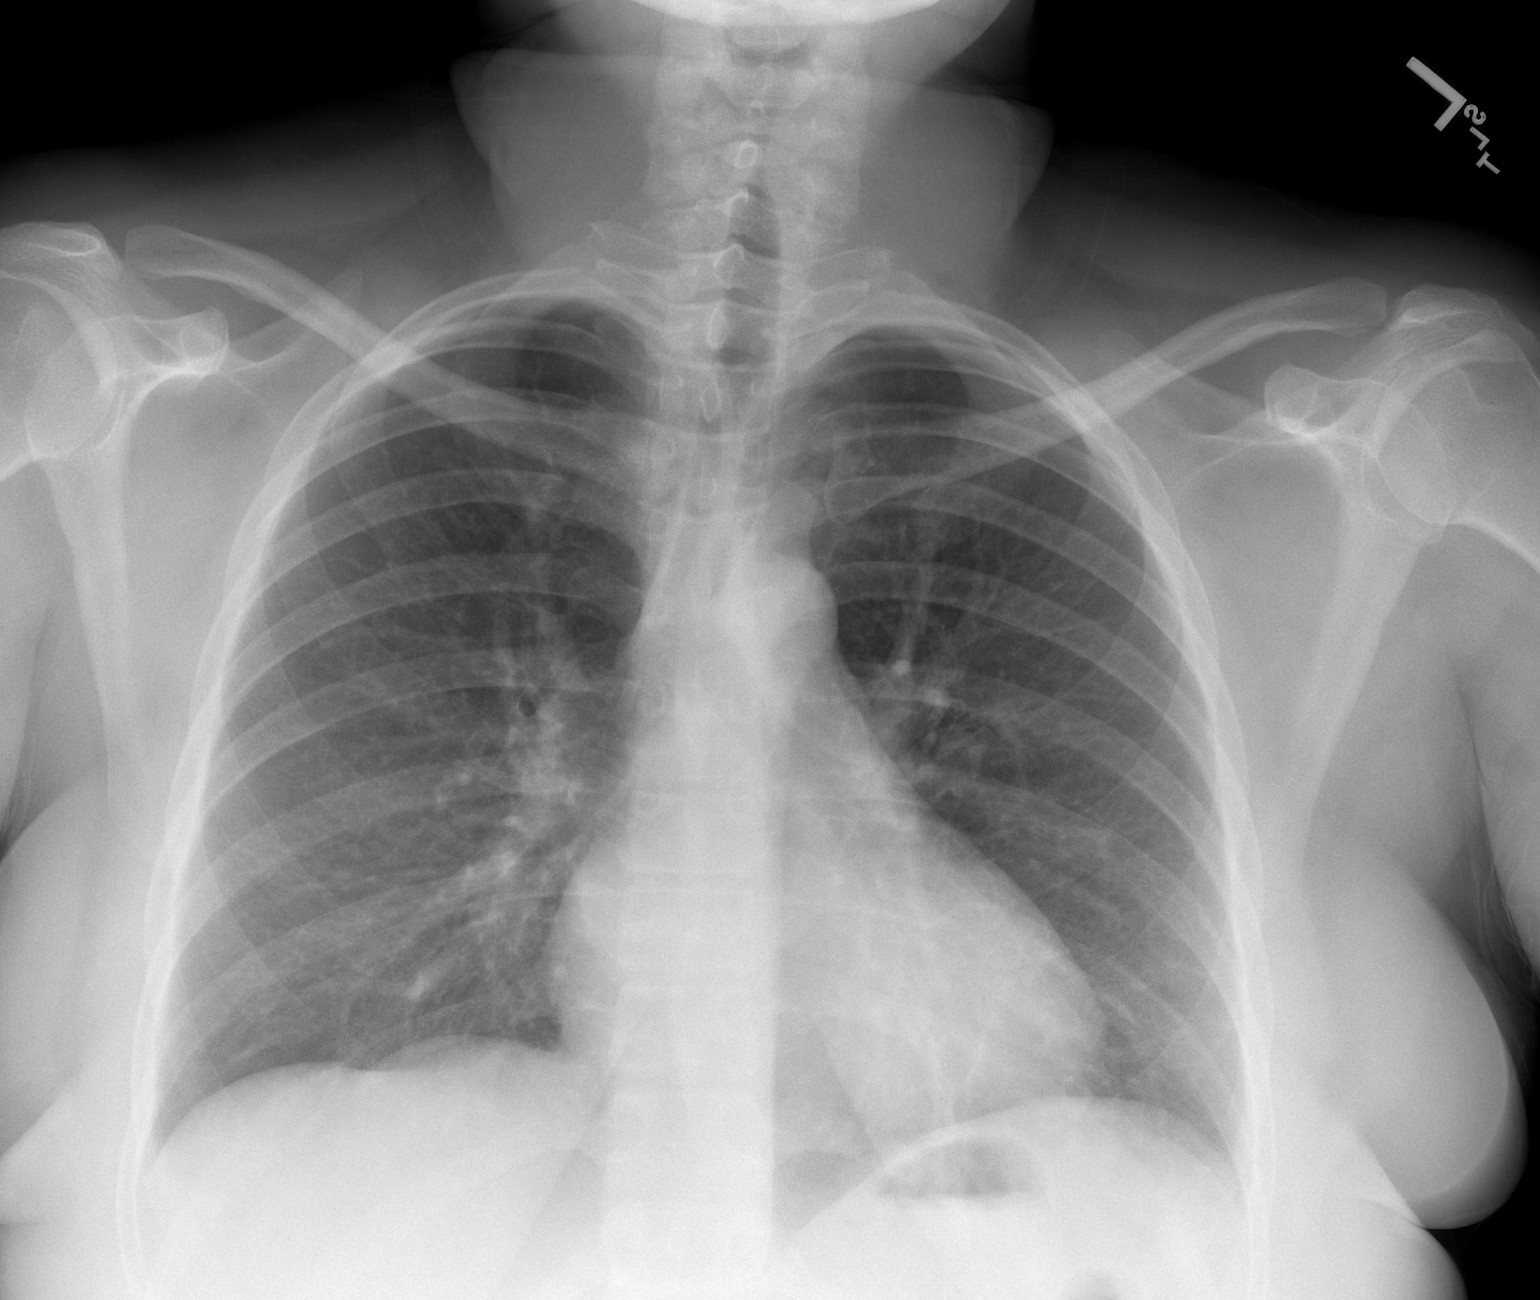

[w chest lat]
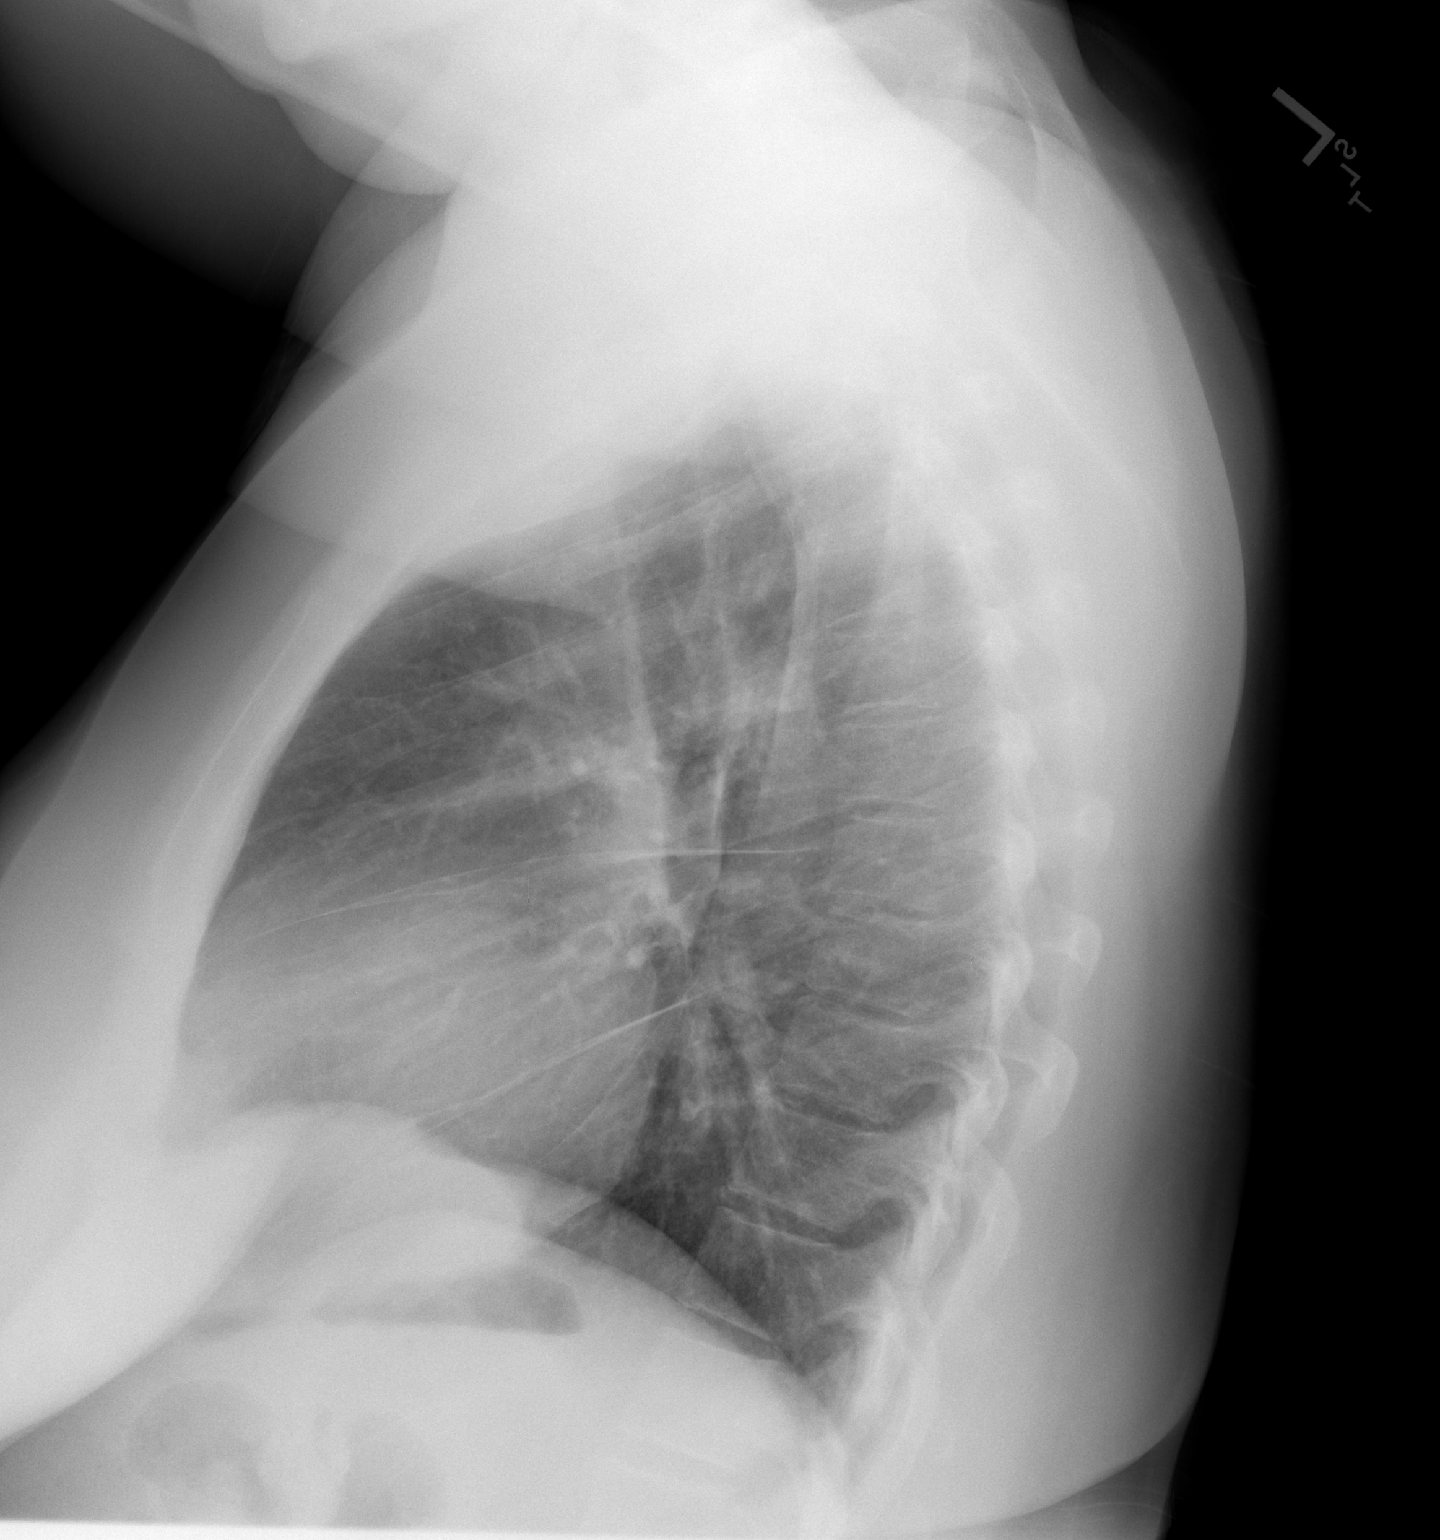

[2 of 2 positions shown; findings below may reference images not displayed]

FINDINGS: The cardiac silhouette, mediastinum, pulmonary
vasculature are within normal limits.  Both lungs are clear.
There is no acute bony abnormality.
IMPRESSION: Stable , normal chest x-ray.

## 2010-05-11 ENCOUNTER — Ambulatory Visit: Payer: Self-pay | Admitting: Physician Assistant

## 2010-05-11 ENCOUNTER — Encounter (INDEPENDENT_AMBULATORY_CARE_PROVIDER_SITE_OTHER): Payer: Self-pay | Admitting: Internal Medicine

## 2010-05-16 LAB — CONVERTED CEMR LAB
BUN: 28 mg/dL — ABNORMAL HIGH (ref 6–23)
Calcium: 9.1 mg/dL (ref 8.4–10.5)
Creatinine, Ser: 1.15 mg/dL (ref 0.40–1.20)
Potassium: 4.6 meq/L (ref 3.5–5.3)

## 2010-06-07 ENCOUNTER — Ambulatory Visit: Payer: Self-pay | Admitting: Internal Medicine

## 2010-06-07 DIAGNOSIS — R079 Chest pain, unspecified: Secondary | ICD-10-CM

## 2010-06-07 DIAGNOSIS — L723 Sebaceous cyst: Secondary | ICD-10-CM

## 2010-06-08 ENCOUNTER — Encounter (INDEPENDENT_AMBULATORY_CARE_PROVIDER_SITE_OTHER): Payer: Self-pay | Admitting: Internal Medicine

## 2010-06-09 ENCOUNTER — Ambulatory Visit: Payer: Self-pay | Admitting: Internal Medicine

## 2010-06-09 ENCOUNTER — Encounter
Admission: RE | Admit: 2010-06-09 | Discharge: 2010-07-28 | Payer: Self-pay | Source: Home / Self Care | Attending: Orthopedic Surgery | Admitting: Orthopedic Surgery

## 2010-06-09 LAB — CONVERTED CEMR LAB: Blood Glucose, Fingerstick: 467

## 2010-06-17 ENCOUNTER — Ambulatory Visit: Payer: Self-pay | Admitting: Internal Medicine

## 2010-06-17 DIAGNOSIS — E860 Dehydration: Secondary | ICD-10-CM | POA: Insufficient documentation

## 2010-06-17 LAB — CONVERTED CEMR LAB
ALT: <8 units/L (ref 0–35)
AST: 14 units/L (ref 0–37)
Basophils Absolute: 0 10*3/uL (ref 0.0–0.1)
Basophils Relative: 0 % (ref 0–1)
CO2: 18 meq/L — ABNORMAL LOW (ref 19–32)
Calcium: 8.8 mg/dL (ref 8.4–10.5)
Chloride: 100 meq/L (ref 96–112)
Hemoglobin: 11.2 g/dL — ABNORMAL LOW (ref 12.0–15.0)
Lymphocytes Relative: 14 % (ref 12–46)
Lymphs Abs: 1.6 10*3/uL (ref 0.7–4.0)
Monocytes Absolute: 0.6 10*3/uL (ref 0.1–1.0)
Neutro Abs: 8.9 10*3/uL — ABNORMAL HIGH (ref 1.7–7.7)
Neutrophils Relative %: 80 % — ABNORMAL HIGH (ref 43–77)
Platelets: 301 10*3/uL (ref 150–400)
RDW: 16.4 % — ABNORMAL HIGH (ref 11.5–15.5)
Sodium: 134 meq/L — ABNORMAL LOW (ref 135–145)
Total Protein: 6.9 g/dL (ref 6.0–8.3)

## 2010-06-22 ENCOUNTER — Telehealth (INDEPENDENT_AMBULATORY_CARE_PROVIDER_SITE_OTHER): Payer: Self-pay | Admitting: Internal Medicine

## 2010-06-29 LAB — CONVERTED CEMR LAB
ALT: <8 units/L (ref 0–35)
Albumin: 4.1 g/dL (ref 3.5–5.2)
Alkaline Phosphatase: 127 units/L — ABNORMAL HIGH (ref 39–117)
Basophils Absolute: 0 10*3/uL (ref 0.0–0.1)
Eosinophils Absolute: 0.1 10*3/uL (ref 0.0–0.7)
Eosinophils Relative: 1 % (ref 0–5)
Glucose, Bld: 48 mg/dL — ABNORMAL LOW (ref 70–99)
Lymphocytes Relative: 21 % (ref 12–46)
Lymphs Abs: 1.9 10*3/uL (ref 0.7–4.0)
MCV: 88.8 fL (ref 78.0–100.0)
Neutrophils Relative %: 74 % (ref 43–77)
Platelets: 388 10*3/uL (ref 150–400)
Potassium: 4.9 meq/L (ref 3.5–5.3)
RDW: 16.6 % — ABNORMAL HIGH (ref 11.5–15.5)
Sodium: 142 meq/L (ref 135–145)
Total Bilirubin: 0.3 mg/dL (ref 0.3–1.2)
Total Protein: 7.4 g/dL (ref 6.0–8.3)
WBC: 8.8 10*3/uL (ref 4.0–10.5)

## 2010-07-08 ENCOUNTER — Telehealth (INDEPENDENT_AMBULATORY_CARE_PROVIDER_SITE_OTHER): Payer: Self-pay | Admitting: Internal Medicine

## 2010-07-19 ENCOUNTER — Encounter
Admission: RE | Admit: 2010-07-19 | Discharge: 2010-07-28 | Payer: Self-pay | Source: Home / Self Care | Attending: Orthopedic Surgery | Admitting: Orthopedic Surgery

## 2010-07-31 DIAGNOSIS — J189 Pneumonia, unspecified organism: Secondary | ICD-10-CM

## 2010-07-31 HISTORY — PX: CORONARY ANGIOPLASTY WITH STENT PLACEMENT: SHX49

## 2010-07-31 HISTORY — DX: Pneumonia, unspecified organism: J18.9

## 2010-08-02 ENCOUNTER — Encounter: Admission: RE | Admit: 2010-08-02 | Payer: Self-pay | Source: Home / Self Care | Admitting: Orthopedic Surgery

## 2010-08-04 ENCOUNTER — Encounter: Admit: 2010-08-04 | Payer: Self-pay | Admitting: Orthopedic Surgery

## 2010-08-05 ENCOUNTER — Encounter (INDEPENDENT_AMBULATORY_CARE_PROVIDER_SITE_OTHER): Payer: Self-pay | Admitting: Internal Medicine

## 2010-08-05 IMAGING — CR DG KNEE COMPLETE 4+V*L*
4 series · 4 of 4 positions shown · non-contrast
Comparison: 06/08/2007.

CLINICAL DATA: Left patella pain following a fall today.

LEFT KNEE - COMPLETE 4+ VIEW

[t knee ap left]
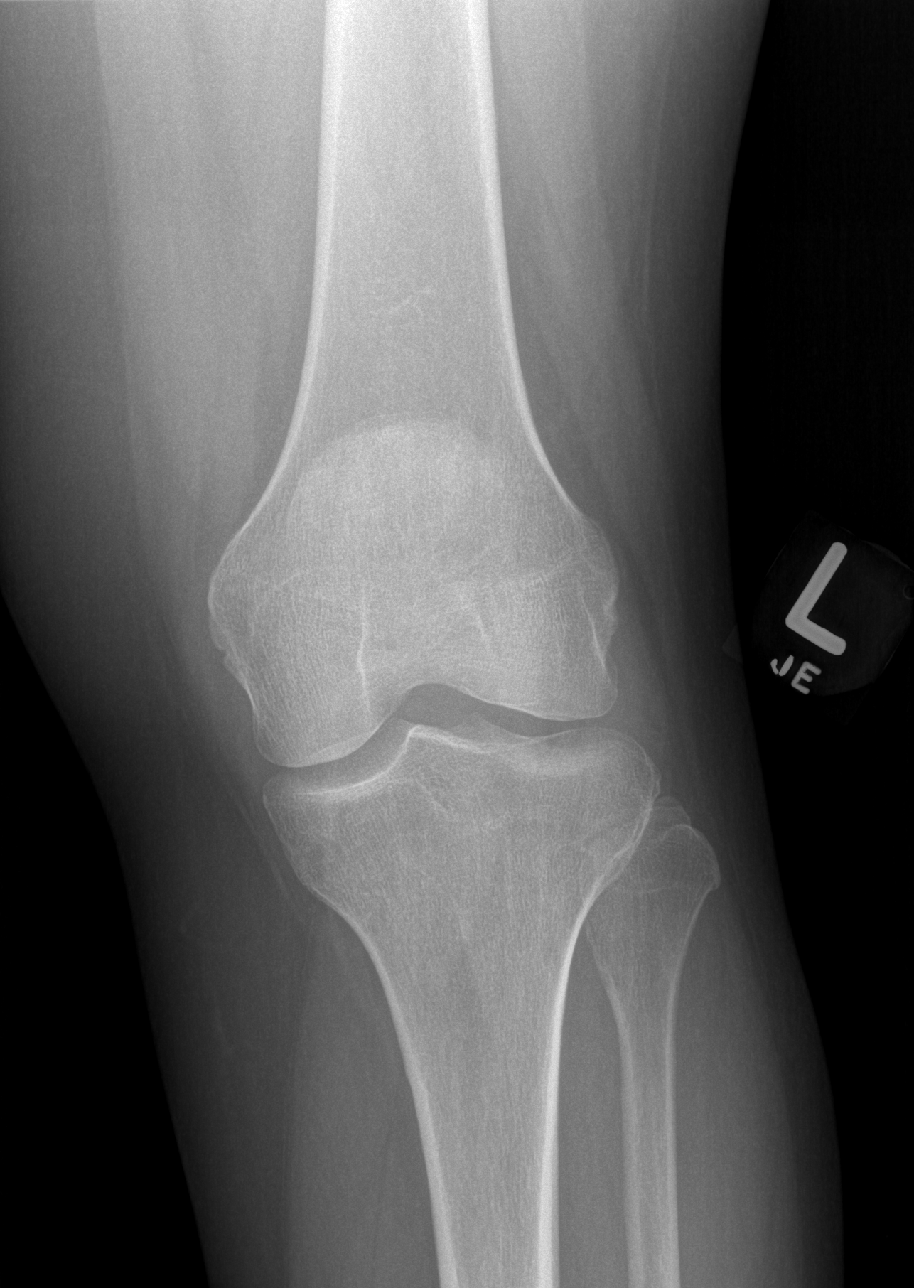

[t knee oblique left (1 of 2)]
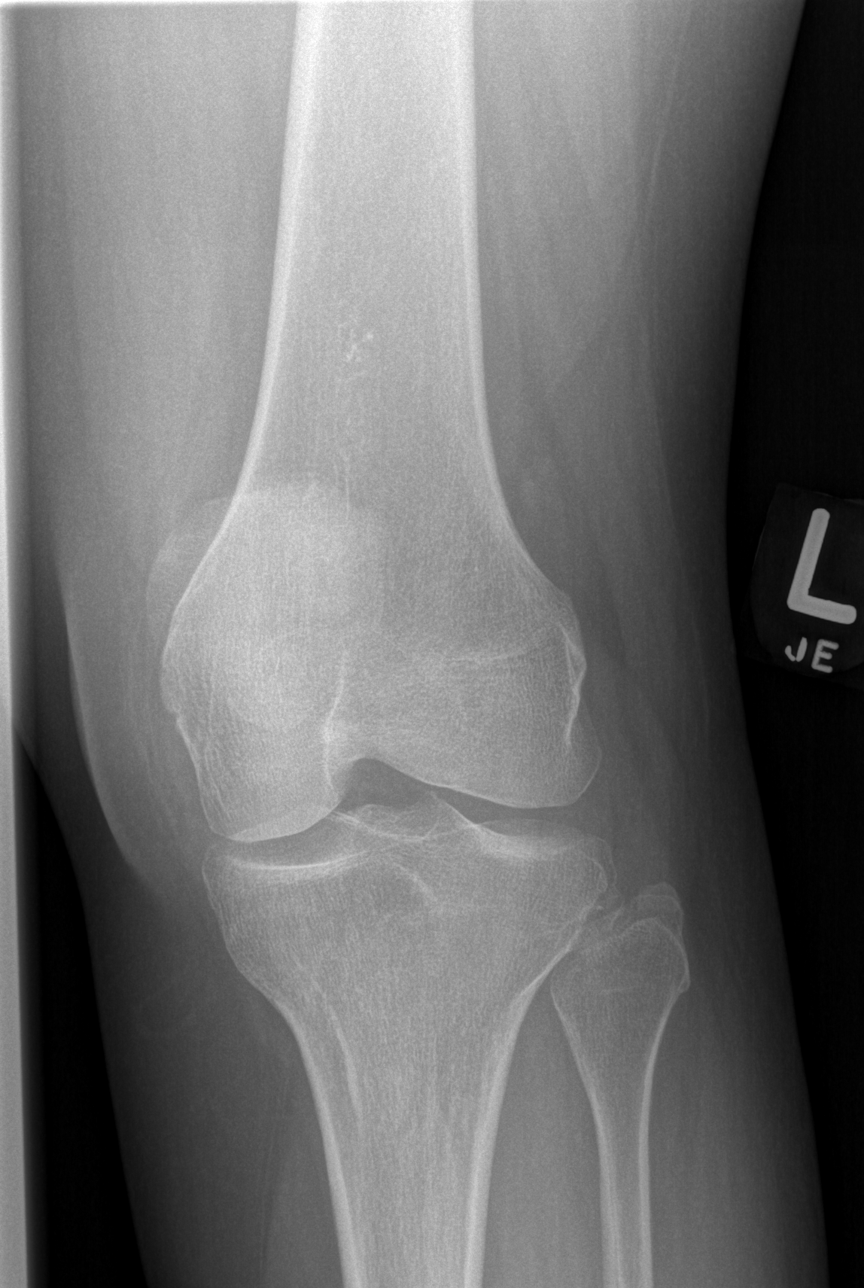

[t knee oblique left (2 of 2)]
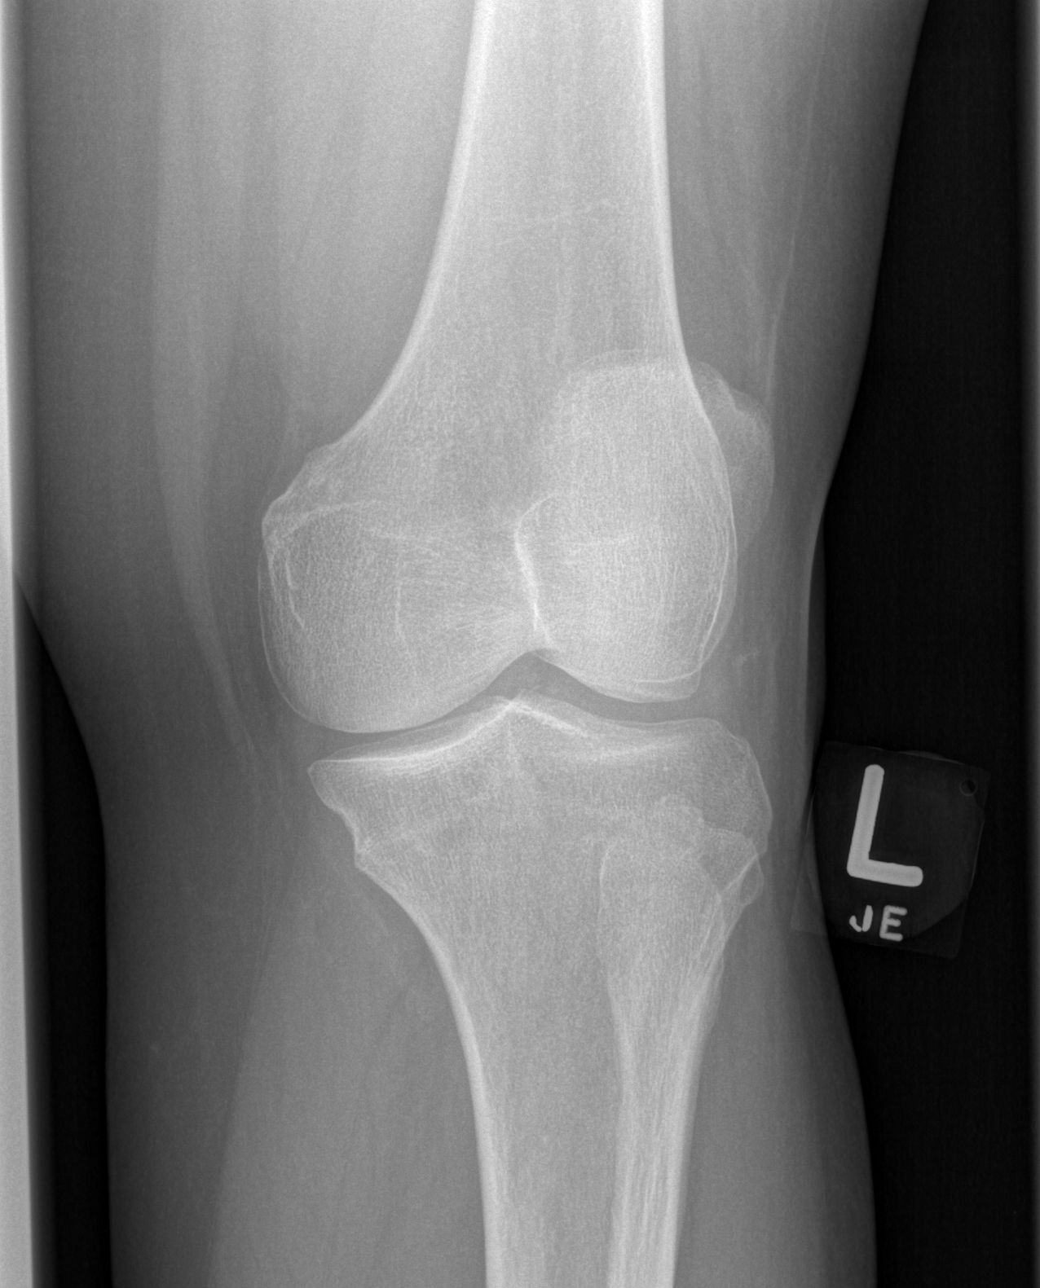

[t knee lat left]
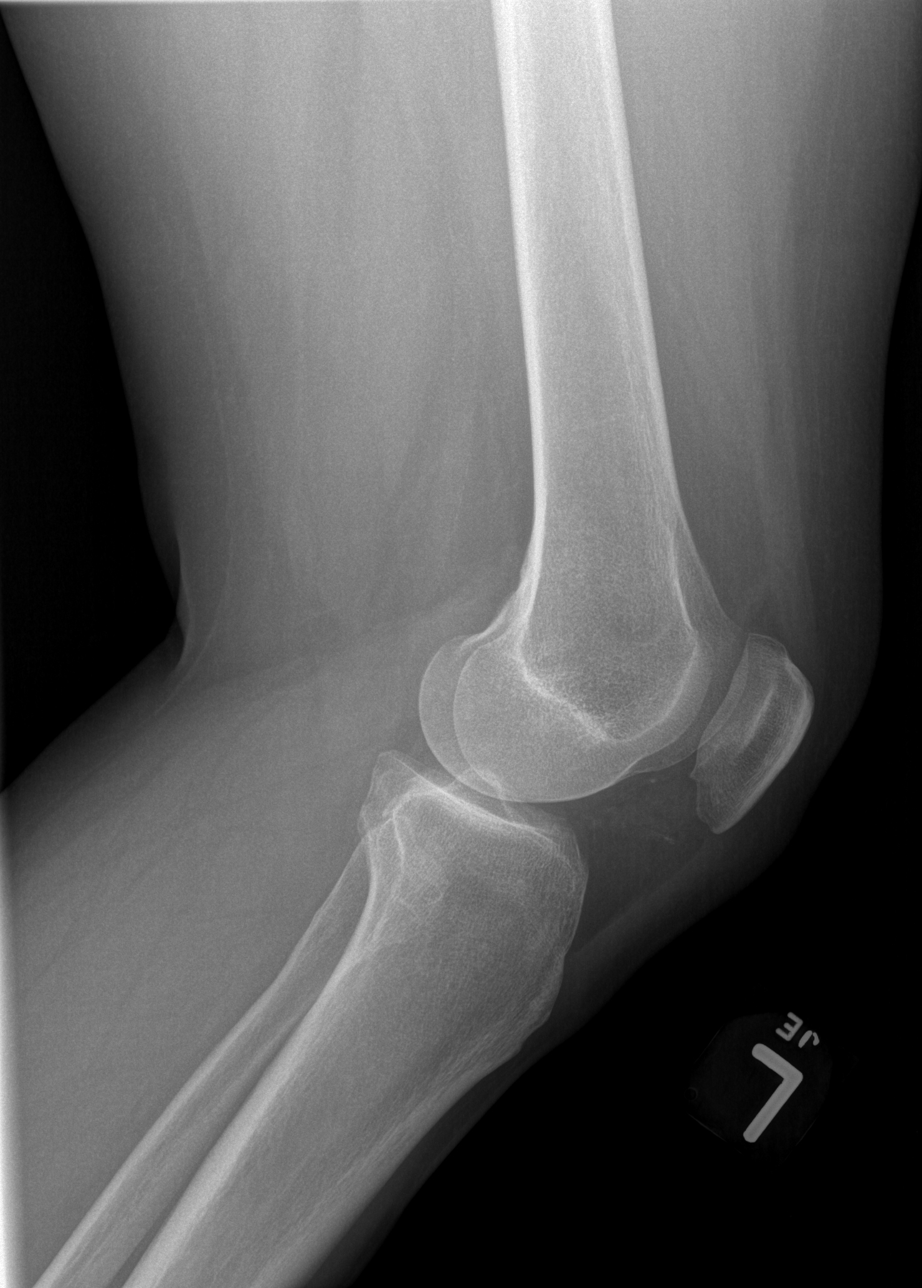

[4 of 4 positions shown; findings below may reference images not displayed]

FINDINGS: Stable normal appearing bones and soft tissues without
fracture or dislocation.  No effusion seen.
IMPRESSION: Normal examination, unchanged.

## 2010-08-08 ENCOUNTER — Telehealth (INDEPENDENT_AMBULATORY_CARE_PROVIDER_SITE_OTHER): Payer: Self-pay | Admitting: Internal Medicine

## 2010-08-11 ENCOUNTER — Telehealth (INDEPENDENT_AMBULATORY_CARE_PROVIDER_SITE_OTHER): Payer: Self-pay | Admitting: Internal Medicine

## 2010-08-18 ENCOUNTER — Ambulatory Visit: Admit: 2010-08-18 | Payer: Self-pay | Admitting: Internal Medicine

## 2010-08-21 ENCOUNTER — Encounter: Payer: Self-pay | Admitting: Internal Medicine

## 2010-08-25 ENCOUNTER — Other Ambulatory Visit: Payer: Self-pay | Admitting: Internal Medicine

## 2010-08-25 ENCOUNTER — Telehealth (INDEPENDENT_AMBULATORY_CARE_PROVIDER_SITE_OTHER): Payer: Self-pay | Admitting: Internal Medicine

## 2010-08-25 ENCOUNTER — Encounter (INDEPENDENT_AMBULATORY_CARE_PROVIDER_SITE_OTHER): Payer: Self-pay | Admitting: Internal Medicine

## 2010-08-25 ENCOUNTER — Ambulatory Visit
Admission: RE | Admit: 2010-08-25 | Discharge: 2010-08-25 | Payer: Self-pay | Source: Home / Self Care | Attending: Internal Medicine | Admitting: Internal Medicine

## 2010-08-25 ENCOUNTER — Other Ambulatory Visit
Admission: RE | Admit: 2010-08-25 | Discharge: 2010-08-25 | Payer: Self-pay | Source: Home / Self Care | Admitting: Internal Medicine

## 2010-08-25 DIAGNOSIS — I2510 Atherosclerotic heart disease of native coronary artery without angina pectoris: Secondary | ICD-10-CM | POA: Insufficient documentation

## 2010-08-25 DIAGNOSIS — L84 Corns and callosities: Secondary | ICD-10-CM | POA: Insufficient documentation

## 2010-08-25 DIAGNOSIS — J019 Acute sinusitis, unspecified: Secondary | ICD-10-CM | POA: Insufficient documentation

## 2010-08-25 LAB — CONVERTED CEMR LAB
Albumin: 4 g/dL (ref 3.5–5.2)
Alkaline Phosphatase: 134 units/L — ABNORMAL HIGH (ref 39–117)
BUN: 28 mg/dL — ABNORMAL HIGH (ref 6–23)
Blood Glucose, Fingerstick: 184
CO2: 19 meq/L (ref 19–32)
Calcium: 9.2 mg/dL (ref 8.4–10.5)
Chloride: 100 meq/L (ref 96–112)
Creatinine, Urine: 326.4 mg/dL
Eosinophils Absolute: 0.2 10*3/uL (ref 0.0–0.7)
GC Probe Amp, Genital: NEGATIVE
Glucose, Bld: 279 mg/dL — ABNORMAL HIGH (ref 70–99)
Glucose, Urine, Semiquant: NEGATIVE
HDL: 57 mg/dL (ref >39)
LDL Cholesterol: 101 mg/dL — ABNORMAL HIGH (ref 0–99)
Lymphocytes Relative: 12 % (ref 12–46)
Lymphs Abs: 1.9 10*3/uL (ref 0.7–4.0)
MCV: 92.3 fL (ref 78.0–100.0)
Microalb Creat Ratio: 177.5 mg/g — ABNORMAL HIGH (ref 0.0–30.0)
Microalb, Ur: 57.92 mg/dL — ABNORMAL HIGH (ref 0.00–1.89)
Monocytes Relative: 6 % (ref 3–12)
Neutro Abs: 13.4 10*3/uL — ABNORMAL HIGH (ref 1.7–7.7)
Neutrophils Relative %: 81 % — ABNORMAL HIGH (ref 43–77)
Platelets: 396 10*3/uL (ref 150–400)
Potassium: 5.1 meq/L (ref 3.5–5.3)
Protein, U semiquant: >=300
RBC: 3.92 M/uL (ref 3.87–5.11)
Sodium: 136 meq/L (ref 135–145)
Specific Gravity, Urine: >=1.03
Total Protein: 7.3 g/dL (ref 6.0–8.3)
Triglycerides: 123 mg/dL (ref <150)
WBC Urine, dipstick: NEGATIVE
WBC: 16.5 10*3/uL — ABNORMAL HIGH (ref 4.0–10.5)
pH: 5

## 2010-08-26 ENCOUNTER — Encounter
Admission: RE | Admit: 2010-08-26 | Discharge: 2010-08-26 | Payer: Self-pay | Source: Home / Self Care | Attending: Internal Medicine | Admitting: Internal Medicine

## 2010-08-26 ENCOUNTER — Telehealth (INDEPENDENT_AMBULATORY_CARE_PROVIDER_SITE_OTHER): Payer: Self-pay | Admitting: Internal Medicine

## 2010-08-28 LAB — CONVERTED CEMR LAB: Blood Glucose, Fingerstick: 123

## 2010-09-01 NOTE — Miscellaneous (Signed)
Summary: [facility]  out[facility]   Imported By: Arta Bruce 04/05/2010 14:14:50  _____________________________________________________________________  External Attachment:    Type:   Image     Comment:   External Document

## 2010-09-01 NOTE — Assessment & Plan Note (Signed)
Summary: nausea//   Vital Signs:  Patient profile:   45 year old female Menstrual status:  regular Weight:      178 pounds Temp:     97.3 degrees F Pulse rhythm:   regular BP sitting:   102 / 68  (left arm) Cuff size:   regular  Vitals Entered By: Vesta Mixer CMA (Dec 28, 2009 2:31 PM) CC: nausea since her last ov not everyday, just comes and goes Is Patient Diabetic? Yes Pain Assessment Patient in pain? no      CBG Result 54  Does patient need assistance? Ambulation Normal Comments Glucose tabs x 3 given.................... Vesta Mixer CMA  Dec 28, 2009 2:41 PM    Primary Care Provider:  [facility]  CC:  nausea since her last ov not everyday and just comes and goes.  History of Present Illness: 1.  Nausea:  Continuation of symptoms from hospitalization in April.  Nausea comes and goes.  Worse when pt. gets hot.  Gets nauseated when smokes cigarettes.  When eats, does not note nausea.  Has been out of Reglan as could not fill until the 22nd of the month.  Having a burning, acid taste coming up into throat.  Does recall that it was a problem when off the Reglan for a week.  Better now.  When gets upset, gets nauseated.  Since switch from Zoloft to Sertraline, has had more difficulties with getting upset.  2.  Mucous in throat.  Having itchy watery eyes and nose, sneezing.  Benadryl helps a bit.  3.  Panic Attacks:  really feels has worsened since changed to generic Sertraline.  Having much difficulty with husband, his kids moving in .  Has discussed leaving him with Aquilla Solian, her counselor.  Lots of guilt regarding leaving him.  Very angry and tearful.       Allergies (verified): 1)  ! Codeine  Physical Exam  General:  Crying and angry at times, eventually calming.   Nose:  Mild mucosal swelling and clear discharge Mouth:  pharynx pink and moist.   Neck:  No deformities, masses, or tenderness noted. Lungs:  Normal respiratory effort, chest expands symmetrically.  Lungs are clear to auscultation, no crackles or wheezes. Heart:  Normal rate and regular rhythm. S1 and S2 normal without gallop, murmur, click, rub or other extra sounds.  Radial pulses normal and equal Abdomen:  Bowel sounds positive,abdomen soft and non-tender without masses, organomegaly or hernias noted.   Impression & Recommendations:  Problem # 1:  NAUSEA (ICD-787.02) Feel most likely this is related gastroparesis. Better now back on Reglan--encouraged to take at lunch as well for 4 times daily dosing.  Her updated medication list for this problem includes:    Promethazine Hcl 25 Mg Tabs (Promethazine hcl) .Marland Kitchen... Take every six hours as needed for nausea  Problem # 2:  ANXIETY (ICD-300.00) and depression. Panic attacks Increase Sertraline Feel she needs to make a life change, that staying with husband is no longer viable Pt. escorted to [employer] office Her updated medication list for this problem includes:    Sertraline Hcl 100 Mg Tabs (Sertraline hcl) .Marland Kitchen... 1 1/2 tabs by mouth daily  Problem # 3:  ALLERGIC RHINITIS (ICD-477.9) Plans to use Benadryl as needed --mild No promethazine when using Benadryl Her updated medication list for this problem includes:    Promethazine Hcl 25 Mg Tabs (Promethazine hcl) .Marland Kitchen... Take every six hours as needed for nausea  Complete Medication List: 1)  Protonix 40  Mg Solr (Pantoprazole sodium) .Marland Kitchen.. 1 by mouth once daily 2)  Sertraline Hcl 100 Mg Tabs (Sertraline hcl) .Marland Kitchen.. 1 1/2 tabs by mouth daily 3)  Tylenol Extra Strength 500 Mg Tabs (Acetaminophen) .... 2 tabs po q 4-6 hrs as needed for fever,pain,headache. not to exceed 8 tabs in 24hrs. 4)  Novolog Flexpen 100 Unit/ml Soln (Insulin aspart) .... Sliding scale ac:  <100:  0 units;  101-150: 4 u ; 151-200: 6 u; 201-250:8 u; 251-300: 10 u; 301-350: 12 u;351-400:14 u; 401-450: 16 u. 5)  Lantus Pen Needles  .... Use with lantus pen  dx=250.3 severe risk hypoglycemia 6)  Lantus For Opticlik  100 Unit/ml Soln (Insulin glargine) .... Inject 45 units subcutaneously  every  morning 7)  Reglan 10 Mg Tabs (Metoclopramide hcl) .... Take one tablet prior to each meal and at bedtime 8)  Simvastatin 40 Mg Tabs (Simvastatin) .Marland Kitchen.. 1 tab by mouth daily 9)  Tramadol Hcl 50 Mg Tabs (Tramadol hcl) .... Take 1-2 tablets by mouth every 6 hours as needed for knee pain. can also be taken with tylenol. 10)  Pepcid 20 Mg Tabs (Famotidine) .... Take 1 tablet by mouth every 12 hours for stomach 11)  Lisinopril 5 Mg Tabs (Lisinopril) .Marland Kitchen.. 1 tab by mouth daily 12)  Plavix 75 Mg Tabs (Clopidogrel bisulfate) .... One tablet by mouth daily 13)  Metoprolol Tartrate 25 Mg Tabs (Metoprolol tartrate) .... 1/2 tablet by mouth two times a day 14)  Nitroglycerin 0.4 Mg/hr Pt24 (Nitroglycerin) .... Use as directed 15)  Methotrexate 2.5 Mg Tabs (Methotrexate sodium) .... 6 tabs by mouth once weekly--take all tabs at same time, same day each week 16)  Folic Acid 1 Mg Tabs (Folic acid) .Marland Kitchen.. 1 tab by mouth daily 17)  Promethazine Hcl 25 Mg Tabs (Promethazine hcl) .... Take every six hours as needed for nausea 18)  Prodigy Blood Glucose Monitor W/device Kit (Blood glucose monitoring suppl) .... 4 times daily blood sugars 19)  Prodigy Blood Glucose Test Strp (Glucose blood) .... 4 times daily blood testing 20)  Prodigy Lancets 26g Misc (Lancets) .... 4 times daily blood testing  Other Orders: Capillary Blood Glucose/CBG (82948) Hgb A1C (81191YN)  Patient Instructions: 1)  Follow up with Dr. Delrae Alfred in 2 weeks Prescriptions: SERTRALINE HCL 100 MG TABS (SERTRALINE HCL) 1 1/2 tabs by mouth daily  #45 x 6   Entered and Authorized by:   Julieanne Manson MD   Signed by:   Julieanne Manson MD on 12/28/2009   Method used:   Electronically to        [facility] [phone]* (retail)       [address]       [place], [place]  [address]       Ph: [phone]       Fax: [phone]   RxID:    [id number]   Laboratory Results   Blood Tests     HGBA1C: 7.4%   (Normal Range: Non-Diabetic - 3-6%   Control Diabetic - 6-8%) CBG Random:: 54mg /dL

## 2010-09-01 NOTE — Assessment & Plan Note (Signed)
Summary: FU FROM URGENT CARE///KT   Vital Signs:  Patient profile:   45 year old female Menstrual status:  regular Weight:      189.4 pounds BMI:     35.92 Temp:     98.2 degrees F oral Pulse rate:   87 / minute Pulse rhythm:   regular BP sitting:   110 / 77  (left arm) Cuff size:   regular  Vitals Entered By: Geanie Cooley  (September 10, 2009 12:35 PM) CC: Pt has sever pain in her left shoulder and arm, Pt states that she cant move the arm at all. Pt went to ER. pt states the ER didnt give her any medications Is Patient Diabetic? Yes Did you bring your meter with you today? No Pain Assessment Patient in pain? yes     Location: shoulder Intensity: 12 Type: throbbing Onset of pain  last night CBG Result 87  Does patient need assistance? Functional Status Self care Ambulation Normal   Primary Care Provider:  [facility]  CC:  Pt has sever pain in her left shoulder and arm and Pt states that she cant move the arm at all. Pt went to ER. pt states the ER didnt give her any medications.  History of Present Illness: 1.  Chest Pain for which she was seen in the ED on 08/31/09:  CXR, cardiac enzymes, EKG without remarkable change.  Had associated nausea.  Pt. stated had left arm pain, neck and jaw pain started that night and eventually into chest.  Was given a GI cocktail and pain apparently resolved.  Not clear how soon after she drank the medicine her symptoms resolved.  Pt. states only the chest pain resolved with the GI Cocktail--continues to intermittently have the arm, shoulder and jaw discomfort.  2.  Left shoulder pain:  feels like she has been hit there--points to anterior aspect of joint.  Left hand has been intermittently read and swollen on dorsum and to MCP joints.  Points to angle of left jaw as area of jaw discomfort.  Knees are bothering her on and off as well.  3.  DM:  sounds like better with eating habits--maybe.  States if just doesn't feel well, does not eat.   Decreased Lantus to 45 units once daily.  Not holding Novolog any longer.  Eating 3 meals daily.  Sugars have not been over 200 lately  Allergies: 1)  ! Codeine  Physical Exam  Lungs:  Normal respiratory effort, chest expands symmetrically. Lungs are clear to auscultation, no crackles or wheezes. Heart:  RRR.  Radial pulses normal and equal Msk:  Very tender over both heads of left trap, pectoral muscle, left sternocleidomastoid musculature.  No erythema or swelling of shoulder or over soft tissues of shoulder.  Decreased ROM secondary to pain.   Impression & Recommendations:  Problem # 1:  ARTHRITIS, RHEUMATOID (ICD-714.0) Feel shoulder discomfort likely related to RA. After long discussion with husband, pt., and daughter, pt. agrees to try a lower dose of prednisone as see if can control some of discomfort with that as well as Tramadol until can get into Rheumatology.  Recognize  concern for weight gain, worsening of DM, which infortunately, has not been well controlled previously, and mood changes while on higher dosing of prednisone previously. Still not clear if Medicaid going through--would be able to get her in in [facility] possibly. Had front office check and has not been formally started on Medicaid.  Problem # 2:  IDDM (ICD-250.01) Possibly  a bit better secondary to compliance with eating habits Still needs a lot of work in that regard. Her updated medication list for this problem includes:    Novolog Flexpen 100 Unit/ml Soln (Insulin aspart) ..... Sliding scale before each meal:  <100:  0 units;  101-150: 4 u ; 151-200: 6 u; 201-250:8 u; 251-300: 10 u; 301-350: 12 u;351-400:14 u; 401-450: 16 u.    Lantus For Opticlik 100 Unit/ml Soln (Insulin glargine) ..... Inject 45 units subcutaneously  every  morning    Lisinopril 5 Mg Tabs (Lisinopril) .Marland Kitchen... 1/2 tab by mouth daily--decrease by dr. Eldridge Dace.  Complete Medication List: 1)  Protonix 40 Mg Solr (Pantoprazole sodium) .Marland Kitchen.. 1  by mouth once daily 2)  Zoloft 100 Mg Tabs (Sertraline hcl) .Marland Kitchen.. 1 by mouth qday 3)  Tylenol Extra Strength 500 Mg Tabs (Acetaminophen) .... 2 tabs po q 4-6 hrs as needed for fever,pain,headache. not to exceed 8 tabs in 24hrs. 4)  Novolog Flexpen 100 Unit/ml Soln (Insulin aspart) .... Sliding scale before each meal:  <100:  0 units;  101-150: 4 u ; 151-200: 6 u; 201-250:8 u; 251-300: 10 u; 301-350: 12 u;351-400:14 u; 401-450: 16 u. 5)  Lantus Pen Needles  .... Use with lantus pen  dx=250.3 severe risk hypoglycemia 6)  Lantus For Opticlik 100 Unit/ml Soln (Insulin glargine) .... Inject 45 units subcutaneously  every  morning 7)  Vistaril 25 Mg Caps (Hydroxyzine pamoate) .... Take 1 capsule by mouth every 8 hours as needed for anxiety 8)  Reglan 10 Mg Tabs (Metoclopramide hcl) .... Take one tablet prior to each meal and at bedtime 9)  Crestor 5 Mg Tabs (Rosuvastatin calcium) .... Take 1 tab by mouth daily 10)  Tramadol Hcl 50 Mg Tabs (Tramadol hcl) .... Take 1-2 tablets by mouth every 6 hours as needed for knee pain. can also be taken with tylenol. 11)  Pepcid 20 Mg Tabs (Famotidine) .... Take 1 tablet by mouth every 12 hours for stomach 12)  Lisinopril 5 Mg Tabs (Lisinopril) .... 1/2 tab by mouth daily--decrease by dr. Eldridge Dace. 13)  Plavix 75 Mg Tabs (Clopidogrel bisulfate) .... One tablet by mouth daily 14)  Metoprolol Tartrate 25 Mg Tabs (Metoprolol tartrate) .... 1/2 tablet by mouth two times a day 15)  Nitroglycerin 0.4 Mg/hr Pt24 (Nitroglycerin) .... Use as directed  Other Orders: Capillary Blood Glucose/CBG (16109)  Patient Instructions: 1)  One 10 mg tab of Prednisone daily 2)  Cut it to 1/2 tab if any side effects and let me know. 3)  Follow up with Dr. Delrae Alfred in 2 weeks--arthritis Prescriptions: TRAMADOL HCL 50 MG TABS (TRAMADOL HCL) Take 1-2 tablets by mouth every 6 hours as needed for knee pain. Can also be taken with tylenol.  #60 x 2   Entered and Authorized by:   Julieanne Manson MD   Signed by:   Julieanne Manson MD on 09/10/2009   Method used:   Print then Give to Patient   RxID:   [id number]    Vital Signs:  Patient profile:   45 year old female Menstrual status:  regular Weight:      189.4 pounds BMI:     35.92 Temp:     98.2 degrees F oral Pulse rate:   87 / minute Pulse rhythm:   regular BP sitting:   110 / 77  (left arm) Cuff size:   regular  Vitals Entered By: Geanie Cooley  (September 10, 2009 12:35 PM)

## 2010-09-01 NOTE — Progress Notes (Signed)
   Phone Note Call from Patient Call back at [facility] Phone [phone]   Summary of Call: Pt went to the ER yesterday because she has back pain  and pneumonia although she is coming tomorrow for her office vsit with Dr Delrae Alfred she wants to make sure if she can take the tramadol for arthritis today. pt is concerned because she has pneumonia. Johann Santone MD   Initial call taken by: [name],  September 23, 2009 2:44 PM  Follow-up for Phone Call        pt here for her f/u. Follow-up by: Vesta Mixer CMA,  September 24, 2009 12:02 PM

## 2010-09-01 NOTE — Assessment & Plan Note (Signed)
Summary: F/U APP//[facility]   Vital Signs:  Patient profile:   45 year old female Menstrual status:  regular Weight:      174.31 pounds Temp:     97.1 degrees F oral Pulse rate:   80 / minute Pulse rhythm:   regular Resp:     20 per minute BP sitting:   102 / 60  (left arm) Cuff size:   regular  Vitals Entered By: Hale Drone, CMA (June 09, 2010 12:52 PM) CC: Pt. is here for a f/u for her hypoglycemia. Pt. states she feels fine but states she feels tired this morning. Pt. states she ate 2 tacos and 4 short bread cookies for breakfast with water. Pt. has not had lunch.  Is Patient Diabetic? Yes Pain Assessment Patient in pain? no      CBG Result 467 CBG Device ID A  Does patient need assistance? Functional Status Self care Ambulation Normal   Primary Care Provider:  [facility]  CC:  Pt. is here for a f/u for her hypoglycemia. Pt. states she feels fine but states she feels tired this morning. Pt. states she ate 2 tacos and 4 short bread cookies for breakfast with water. Pt. has not had lunch. .  History of Present Illness: 1.  Hypoglycemia:  in office 2 days ago with significant episode.  Since being seen, has been running sugars in low 100s until this morning.  Ate 4 tacos last night and a salad with dressing.  Also ate 2 tacos before coming in today.    2.  Superficial abscess in left groin feeling better.  No fever.  Taking Suprax daily.  Received Ceftriaxone when seen 2 days ago.  Not changing dressing and soaking in warm water two times daily as discussed.  3.  Needs labs for Rheum appt. next week.  Current Medications (verified): 1)  Omeprazole 40 Mg Cpdr (Omeprazole) .Marland Kitchen.. 1 Cap By Mouth 1/2 Hour Before First Meal of Day. 2)  Sertraline Hcl 100 Mg Tabs (Sertraline Hcl) .Marland Kitchen.. 1 1/2 Tabs By Mouth Daily 3)  Tylenol Extra Strength 500 Mg  Tabs (Acetaminophen) .... 2 Tabs Po Q 4-6 Hrs As Needed For Fever,pain,headache. Not To Exceed 8 Tabs in 24hrs. 4)  Novolog Flexpen 100  Unit/ml Soln (Insulin Aspart) .... Sliding Scale Ac:  <100:  0 Units;  101-150: 4 U ; 151-200: 6 U; 201-250:8 U; 251-300: 10 U; 301-350: 12 U;351-400:14 U; 401-450: 16 U. 5)  Lantus Pen Needles .... Use With Lantus Pen  Dx=250.3 Severe Risk Hypoglycemia 6)  Lantus For Opticlik 100 Unit/ml  Soln (Insulin Glargine) .... Inject 45 Units Subcutaneously  Every  Morning 7)  Reglan 10 Mg  Tabs (Metoclopramide Hcl) .... Take One Tablet Prior To Each Meal and At Bedtime 8)  Simvastatin 40 Mg Tabs (Simvastatin) .Marland Kitchen.. 1 Tab By Mouth Daily 9)  Tramadol Hcl 50 Mg Tabs (Tramadol Hcl) .... Take 1-2 Tablets By Mouth Every 6 Hours As Needed For Knee Pain. Can Also Be Taken With Tylenol. 10)  Pepcid 20 Mg Tabs (Famotidine) .... Take 1 Tablet By Mouth Every 12 Hours For Stomach 11)  Lisinopril 5 Mg Tabs (Lisinopril) .Marland Kitchen.. 1 Tab By Mouth Daily 12)  Plavix 75 Mg Tabs (Clopidogrel Bisulfate) .... One Tablet By Mouth Daily 13)  Metoprolol Tartrate 25 Mg Tabs (Metoprolol Tartrate) .... 1/2 Tablet By Mouth Two Times A Day 14)  Nitroglycerin 0.4 Mg/hr Pt24 (Nitroglycerin) .Marland Kitchen.. 1 Tab Sublingual As Needed Chest Pain.  May Repeat Every 5  Minutes X2 If Not Relieved. 15)  Methotrexate 2.5 Mg Tabs (Methotrexate Sodium) .... 6 Tabs By Mouth Once Weekly--Take All Tabs At Same Time, Same Day Each Week 16)  Folic Acid 1 Mg Tabs (Folic Acid) .Marland Kitchen.. 1 Tab By Mouth Daily 17)  Promethazine Hcl 25 Mg Tabs (Promethazine Hcl) .... Take Every Six Hours As Needed For Nausea 18)  Prodigy Blood Glucose Monitor W/device Kit (Blood Glucose Monitoring Suppl) .... 4 Times Daily Blood Sugars 19)  Prodigy Blood Glucose Test  Strp (Glucose Blood) .... 4 Times Daily Blood Testing 20)  Prodigy Lancets 26g  Misc (Lancets) .... 4 Times Daily Blood Testing 21)  Ferrous Sulfate 325 (65 Fe) Mg Tabs (Ferrous Sulfate) .Marland Kitchen.. 1 Tab By Mouth Daily 22)  Suprax 400 Mg Tabs (Cefixime) .Marland Kitchen.. 1 Tab By Mouth Daily For 7 Days  Allergies (verified): 1)  !  Codeine  Physical Exam  Lungs:  Normal respiratory effort, chest expands symmetrically. Lungs are clear to auscultation, no crackles or wheezes. Heart:  Normal rate and regular rhythm. S1 and S2 normal without gallop, murmur, click, rub or other extra sounds. Abdomen:  Left groin abscess with scant discharge, erythema is decreased and minimal tenderness.   Impression & Recommendations:  Problem # 1:  SEBACEOUS CYST, INFECTED (ICD-706.2) Much improved--finish Suprax Need to soak in warm water two times a day as discussed and change dressing.  Problem # 2:  IDDM (ICD-250.01) Need to work on diet--lot of carbs last night and today Her updated medication list for this problem includes:    Novolog Flexpen 100 Unit/ml Soln (Insulin aspart) ..... Sliding scale ac:  <100:  0 units;  101-150: 4 u ; 151-200: 6 u; 201-250:8 u; 251-300: 10 u; 301-350: 12 u;351-400:14 u; 401-450: 16 u.    Lantus For Opticlik 100 Unit/ml Soln (Insulin glargine) ..... Inject 45 units subcutaneously  every  morning    Lisinopril 5 Mg Tabs (Lisinopril) .Marland Kitchen... 1 tab by mouth daily  Complete Medication List: 1)  Omeprazole 40 Mg Cpdr (Omeprazole) .Marland Kitchen.. 1 cap by mouth 1/2 hour before first meal of day. 2)  Sertraline Hcl 100 Mg Tabs (Sertraline hcl) .Marland Kitchen.. 1 1/2 tabs by mouth daily 3)  Tylenol Extra Strength 500 Mg Tabs (Acetaminophen) .... 2 tabs po q 4-6 hrs as needed for fever,pain,headache. not to exceed 8 tabs in 24hrs. 4)  Novolog Flexpen 100 Unit/ml Soln (Insulin aspart) .... Sliding scale ac:  <100:  0 units;  101-150: 4 u ; 151-200: 6 u; 201-250:8 u; 251-300: 10 u; 301-350: 12 u;351-400:14 u; 401-450: 16 u. 5)  Lantus Pen Needles  .... Use with lantus pen  dx=250.3 severe risk hypoglycemia 6)  Lantus For Opticlik 100 Unit/ml Soln (Insulin glargine) .... Inject 45 units subcutaneously  every  morning 7)  Reglan 10 Mg Tabs (Metoclopramide hcl) .... Take one tablet prior to each meal and at bedtime 8)  Simvastatin 40 Mg  Tabs (Simvastatin) .Marland Kitchen.. 1 tab by mouth daily 9)  Tramadol Hcl 50 Mg Tabs (Tramadol hcl) .... Take 1-2 tablets by mouth every 6 hours as needed for knee pain. can also be taken with tylenol. 10)  Pepcid 20 Mg Tabs (Famotidine) .... Take 1 tablet by mouth every 12 hours for stomach 11)  Lisinopril 5 Mg Tabs (Lisinopril) .Marland Kitchen.. 1 tab by mouth daily 12)  Plavix 75 Mg Tabs (Clopidogrel bisulfate) .... One tablet by mouth daily 13)  Metoprolol Tartrate 25 Mg Tabs (Metoprolol tartrate) .... 1/2 tablet by mouth two times a  day 14)  Nitroglycerin 0.4 Mg/hr Pt24 (Nitroglycerin) .Marland Kitchen.. 1 tab sublingual as needed chest pain.  may repeat every 5 minutes x2 if not relieved. 15)  Methotrexate 2.5 Mg Tabs (Methotrexate sodium) .... 6 tabs by mouth once weekly--take all tabs at same time, same day each week 16)  Folic Acid 1 Mg Tabs (Folic acid) .Marland Kitchen.. 1 tab by mouth daily 17)  Promethazine Hcl 25 Mg Tabs (Promethazine hcl) .... Take every six hours as needed for nausea 18)  Prodigy Blood Glucose Monitor W/device Kit (Blood glucose monitoring suppl) .... 4 times daily blood sugars 19)  Prodigy Blood Glucose Test Strp (Glucose blood) .... 4 times daily blood testing 20)  Prodigy Lancets 26g Misc (Lancets) .... 4 times daily blood testing 21)  Ferrous Sulfate 325 (65 Fe) Mg Tabs (Ferrous sulfate) .Marland Kitchen.. 1 tab by mouth daily 22)  Suprax 400 Mg Tabs (Cefixime) .Marland Kitchen.. 1 tab by mouth daily for 7 days  Other Orders: Capillary Blood Glucose/CBG (45409) T-CBC w/Diff ([id number]) T-Comprehensive Metabolic Panel ([id number])  Patient Instructions: 1)  Keep appt. in January with Dr. Delrae Alfred   Orders Added: 1)  Capillary Blood Glucose/CBG [82948] 2)  Est. Patient Level II [99212] 3)  T-CBC w/Diff [[id number]] 4)  T-Comprehensive Metabolic Panel [[id number]]

## 2010-09-01 NOTE — Miscellaneous (Signed)
Summary: DISCHARGE SUMMARY  DISCHARGE SUMMARY   Imported By: Arta Bruce 03/30/2010 11:07:09  _____________________________________________________________________  External Attachment:    Type:   Image     Comment:   External Document

## 2010-09-01 NOTE — Progress Notes (Signed)
Summary: Re Rheumatology  Phone Note Call from Patient   Summary of Call: pt called to say she is itching in her vagina... pt says she just started her cycle.... pt says she has not used any new soaps... pt says she has white thick discharge she notice before her cycle...Marland KitchenMarland Kitchen pt says her right hip hurts very badly.... pt says it was her left hip first and now its her right.... pt says she has tramadol but its not working....  Initial call taken by: [place] Shannon,  August 11, 2010 4:49 PM  Follow-up for Phone Call        To come in for self swab. Needs ov to discuss pain meds and pain--is she following up with Rheumatology at all? Follow-up by: Julieanne Manson MD,  August 12, 2010 6:17 PM  Additional Follow-up for Phone Call Additional follow up Details #1::        Pt. will schedule appt. for lab visit wet prep once she goes off her period. She will also schedule OV to discuss pain.  Has been seeing Rheumatologist next appt. is in May. Gaylyn Cheers RN  August 16, 2010 4:13 PM

## 2010-09-01 NOTE — Assessment & Plan Note (Signed)
Summary: 2 WEEK FU/FELL IN [facility]///KT   Vital Signs:  Patient profile:   45 year old female Menstrual status:  regular Weight:      176 pounds Temp:     97.7 degrees F Pulse rate:   86 / minute Pulse rhythm:   regular Resp:     18 per minute BP sitting:   90 / 57  (left arm) Cuff size:   regular  Vitals Entered By: Vesta Mixer CMA (January 11, 2010 10:07 AM) CC: 2 week f/u from fall in [facility] fell on knee.  Went to ED was told she had a contusion, given vicodin Is Patient Diabetic? Yes Pain Assessment Patient in pain? yes     Location: lt knee Intensity: 10 CBG Result 117  Does patient need assistance? Ambulation Normal   Primary Care Provider:  [facility]  CC:  2 week f/u from fall in [facility] fell on knee.  Went to ED was told she had a contusion and given vicodin.  History of Present Illness: 1.  Anxiety/depression:  Pt. tolerating increase in Sertraline.  No definite improvement.  2.  Larey Seat in [place] on some water on floor per pt.  This Occurred June 6th.  Came down on left knee.  Was seen at ED.  Xrays done without bony injury.  Pt. states knee swelled up right away.  Noted to have a contusion over patella.  Knee still quite painful.  Swelling down a little bit.  Alternating heat and cold packs to knee.  Not clear if keeping leg elevated--at times it sounds like she is.  Does have crutches, but sounds like she is bearing weight without them.  Was given elastic sleeve for knee.  Feels she is overmedicating with Vicodin.  Using Tramadol.    Allergies (verified): 1)  ! Codeine  Physical Exam  Extremities:  Obvious effusion of left knee. Tender over lateral and medial joint lines as well as on palpation of patella.  No contusion noted today.  No crepitation.  Unable to adequately examine cruciates or collateral ligaments as pt. still with fair amt of pain and resistance with exam.  Distal pulses normal   Impression & Recommendations:  Problem # 1:  KNEE  INJURY, LEFT (ICD-959.7)  Orders: MRI without Contrast (MRI w/o Contrast)--to rule out internal derangement of cartilage or ligaments.  Complete Medication List: 1)  Omeprazole 40 Mg Cpdr (Omeprazole) .Marland Kitchen.. 1 cap by mouth 1/2 hour before first meal of day. 2)  Sertraline Hcl 100 Mg Tabs (Sertraline hcl) .Marland Kitchen.. 1 1/2 tabs by mouth daily 3)  Tylenol Extra Strength 500 Mg Tabs (Acetaminophen) .... 2 tabs po q 4-6 hrs as needed for fever,pain,headache. not to exceed 8 tabs in 24hrs. 4)  Novolog Flexpen 100 Unit/ml Soln (Insulin aspart) .... Sliding scale ac:  <100:  0 units;  101-150: 4 u ; 151-200: 6 u; 201-250:8 u; 251-300: 10 u; 301-350: 12 u;351-400:14 u; 401-450: 16 u. 5)  Lantus Pen Needles  .... Use with lantus pen  dx=250.3 severe risk hypoglycemia 6)  Lantus For Opticlik 100 Unit/ml Soln (Insulin glargine) .... Inject 45 units subcutaneously  every  morning 7)  Reglan 10 Mg Tabs (Metoclopramide hcl) .... Take one tablet prior to each meal and at bedtime 8)  Simvastatin 40 Mg Tabs (Simvastatin) .Marland Kitchen.. 1 tab by mouth daily 9)  Tramadol Hcl 50 Mg Tabs (Tramadol hcl) .... Take 1-2 tablets by mouth every 6 hours as needed for knee pain. can also be taken with tylenol.  10)  Pepcid 20 Mg Tabs (Famotidine) .... Take 1 tablet by mouth every 12 hours for stomach 11)  Lisinopril 5 Mg Tabs (Lisinopril) .Marland Kitchen.. 1 tab by mouth daily 12)  Plavix 75 Mg Tabs (Clopidogrel bisulfate) .... One tablet by mouth daily 13)  Metoprolol Tartrate 25 Mg Tabs (Metoprolol tartrate) .... 1/2 tablet by mouth two times a day 14)  Nitroglycerin 0.4 Mg/hr Pt24 (Nitroglycerin) .... Use as directed 15)  Methotrexate 2.5 Mg Tabs (Methotrexate sodium) .... 6 tabs by mouth once weekly--take all tabs at same time, same day each week 16)  Folic Acid 1 Mg Tabs (Folic acid) .Marland Kitchen.. 1 tab by mouth daily 17)  Promethazine Hcl 25 Mg Tabs (Promethazine hcl) .... Take every six hours as needed for nausea 18)  Prodigy Blood Glucose Monitor W/device  Kit (Blood glucose monitoring suppl) .... 4 times daily blood sugars 19)  Prodigy Blood Glucose Test Strp (Glucose blood) .... 4 times daily blood testing 20)  Prodigy Lancets 26g Misc (Lancets) .... 4 times daily blood testing  Other Orders: Capillary Blood Glucose/CBG (16109)  Patient Instructions: 1)  To limit all pain meds--choose one at least every 4 hours --do not overlap

## 2010-09-01 NOTE — Progress Notes (Signed)
Summary: podiatry referral--diabetic foot care/calluses  Phone Note Outgoing Call   Summary of Call: [name] [name]--podiatry referral Initial call taken by: Julieanne Manson MD,  August 25, 2010 2:17 PM  Follow-up for Phone Call        PT HAS AN APPT 09-14-10 @ 2:30PN [facility] PH # [phone] [address]ESS [address] LVM TO PT TO CALL ME BACK .Marland KitchenCheryll Dessert  August 25, 2010 5:03 PM

## 2010-09-01 NOTE — Progress Notes (Signed)
   Phone Note Outgoing Call   Summary of Call: Debra--can you fax off pt's recent OVs--at least the last 2 in March along with all the labs and xrays that were done in March--these need to be added to records previously sent to [facility] Rheum clinic.  She is now on Medicaid as well. Initial call taken by: Julieanne Manson MD,  October 22, 2009 11:35 AM

## 2010-09-01 NOTE — Assessment & Plan Note (Signed)
Summary: FOLLOW UP WITH DR Devarion Mcclanahan IN 4 MONTHS//GK   Vital Signs:  Patient profile:   45 year old female Menstrual status:  regular Weight:      189.5 pounds Temp:     98.9 degrees F Pulse rate:   96 / minute Pulse rhythm:   regular Resp:     20 per minute BP sitting:   135 / 104  (left arm) Cuff size:   regular  Vitals Entered By: Vesta Mixer CMA (October 08, 2009 9:05 AM) CC: 2 week f/u Sugar low  3 glucose tabs given. Is Patient Diabetic? Yes Pain Assessment Patient in pain? no      CBG Result 47  Does patient need assistance? Ambulation Normal   Primary Care Provider:  [facility]ins  CC:  2 week f/u Sugar low  3 glucose tabs given.Marland Kitchen  History of Present Illness: 1.  RA:  confirmatory testing, anti - CCP was quite elevated, though Xrays did not show erosions or characteristic changes of RA.  RF remains [place] in 300s as well.  Pt. currently without joint complaints, but using Tramadol regularly--later admits hands have been stiff and painful.  Did speak with local Rheumatologist.  Pt. willing to start MTX while awaiting Rheumatology  appt.  Liver enzymes, kidney function, Hepatitis and HIV all normal or negative.  Discussed at length complications of MTX listed in recent OV note.  2.  DM:  Sugars not getting any higher than mid 200s.  Cannot get a good history as to how she is eating.  States she did have grapes and oatmeal with milk this morning at about 8 a.m.  Took Lantus at 6 a.m.  States she ate a bit of a pork chop right after.  Has not used Novolog in one month.  We have had multiple discussion regarding this--she is afraid of bottoming out.  Has never had A1C below upper 8 % range.    3.  Htn:  states has been taking meds.  Allergies (verified): 1)  ! Codeine  Physical Exam  Msk:  Stiffness of 2nd-4th fingers bilaterally with mild swelling at MCPs and some synovial thickening of PIPs-mild tenderness.   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 continues to avoid short acting insulin--though sugars are better in general currently Her updated medication list for this problem includes:    Novolog Flexpen 100 Unit/ml Soln (Insulin aspart) ..... Sliding scale before each meal:  <100:  0 units;  101-150: 4 u ; 151-200: 6 u; 201-250:8 u; 251-300: 10 u; 301-350: 12 u;351-400:14 u; 401-450: 16 u.    Lantus For Opticlik 100 Unit/ml Soln (Insulin glargine) ..... Inject 45 units subcutaneously  every  morning    Lisinopril 5 Mg Tabs (Lisinopril) .Marland Kitchen... 1/2 tab by mouth daily--decrease by dr. Eldridge Dace.  Problem # 3:  ESSENTIAL HYPERTENSION (ICD-401.9) BP up today--follow up in 2 weeks--no med change for now. Encouraged pt. to make sure she is taking meds Her updated medication list for this problem includes:    Lisinopril 5 Mg Tabs (Lisinopril) .Marland Kitchen... 1/2 tab by mouth daily--decrease by dr. Eldridge Dace.    Metoprolol Tartrate 25  Mg Tabs (Metoprolol tartrate) .Marland Kitchen... 1/2 tablet by mouth two times a day  Complete Medication List: 1)  Protonix 40 Mg Solr (Pantoprazole sodium) .Marland Kitchen.. 1 by mouth once daily 2)  Zoloft 100 Mg Tabs (Sertraline hcl) .Marland Kitchen.. 1 by mouth qday 3)  Tylenol Extra Strength 500 Mg Tabs (Acetaminophen) .... 2 tabs po q 4-6 hrs as needed for fever,pain,headache. not to exceed 8 tabs in 24hrs. 4)  Novolog Flexpen 100 Unit/ml Soln (Insulin aspart) .... Sliding scale before each meal:  <100:  0 units;  101-150: 4 u ; 151-200: 6 u; 201-250:8 u; 251-300: 10 u; 301-350: 12 u;351-400:14 u; 401-450: 16 u. 5)  Lantus Pen Needles  .... Use with lantus pen  dx=250.3 severe risk hypoglycemia 6)  Lantus For Opticlik  100 Unit/ml Soln (Insulin glargine) .... Inject 45 units subcutaneously  every  morning 7)  Vistaril 25 Mg Caps (Hydroxyzine pamoate) .... Take 1 capsule by mouth every 8 hours as needed for anxiety 8)  Reglan 10 Mg Tabs (Metoclopramide hcl) .... Take one tablet prior to each meal and at bedtime 9)  Crestor 5 Mg Tabs (Rosuvastatin calcium) .... Take 1 tab by mouth daily 10)  Tramadol Hcl 50 Mg Tabs (Tramadol hcl) .... Take 1-2 tablets by mouth every 6 hours as needed for knee pain. can also be taken with tylenol. 11)  Pepcid 20 Mg Tabs (Famotidine) .... Take 1 tablet by mouth every 12 hours for stomach 12)  Lisinopril 5 Mg Tabs (Lisinopril) .... 1/2 tab by mouth daily--decrease by dr. Eldridge Dace. 13)  Plavix 75 Mg Tabs (Clopidogrel bisulfate) .... One tablet by mouth daily 14)  Metoprolol Tartrate 25 Mg Tabs (Metoprolol tartrate) .... 1/2 tablet by mouth two times a day 15)  Nitroglycerin 0.4 Mg/hr Pt24 (Nitroglycerin) .... Use as directed 16)  Methotrexate 2.5 Mg Tabs (Methotrexate sodium) .... 4 tabs by mouth once weekly--take all tabs at same time, same day each week 17)  Folic Acid 1 Mg Tabs (Folic acid) .Marland Kitchen.. 1 tab by mouth daily  Other Orders: Capillary Blood Glucose/CBG (16109)  Patient Instructions: 1)  CBC and CMET in [redacted] weeks along with OV 2)  Call if any concerns for side effects of med. 3)  Make sure you take the Folic Acid every day Prescriptions: FOLIC ACID 1 MG TABS (FOLIC ACID) 1 tab by mouth daily  #30 x 11   Entered and Authorized by:   Julieanne Manson MD   Signed by:   Julieanne Manson MD on 10/08/2009   Method used:   Faxed to ...       [facility] (retail)       [address] [place], [place]  [address]       Ph: [phone]       Fax: [phone]   RxID:   [id number] METHOTREXATE 2.5 MG TABS (METHOTREXATE SODIUM) 4 tabs by mouth once weekly--take all tabs at same time, same day each week  #16 x 0   Entered and  Authorized by:   Julieanne Manson MD   Signed by:   Julieanne Manson MD on 10/08/2009   Method used:   Faxed to ...       [facility] (retail)       [address] [place], [place]  [address]       Ph: [phone]       Fax: [phone]  RxID:   [id number]

## 2010-09-01 NOTE — Progress Notes (Signed)
   Phone Note Outgoing Call   Summary of Call: Please let Ms. Ord know that Medicaid denied her MRI--stating must continue with the sleeve she was wearing and rest for 4 weeks.  Will also get her set up with PT and have her follow up with me in 4 weeks--if still with problems, will get the MRI set up. Initial call taken by: Julieanne Manson MD,  January 17, 2010 8:40 AM  Follow-up for Phone Call        Pt notified. Follow-up by: Vesta Mixer CMA,  January 18, 2010 8:39 AM

## 2010-09-01 NOTE — Progress Notes (Signed)
Summary: lab question  Phone Note Call from Patient   Summary of Call: Laquan Berkery wants to know if she has to come to the app today for lab, she said she did lab last week please call her at [phone] Initial call taken by: Domenic Polite,  June 22, 2010 9:13 AM  Follow-up for Phone Call        pt did come and do lab test already.... [place] Shannon  June 22, 2010 10:03 AM

## 2010-09-01 NOTE — Miscellaneous (Signed)
   Clinical Lists Changes  Orders: Added new Referral order of Rheumatology Referral (Rheumatology) - Signed

## 2010-09-01 NOTE — Letter (Signed)
Summary: nutrtion & diabetes/NO SHOW  nutrtion & diabetes/NO SHOW   Imported By: Arta Bruce 03/18/2010 15:37:36  _____________________________________________________________________  External Attachment:    Type:   Image     Comment:   External Document

## 2010-09-01 NOTE — Assessment & Plan Note (Signed)
Summary: 3 WEEK FU///KT   Vital Signs:  Patient profile:   45 year old female Menstrual status:  regular Height:      61 inches Weight:      184 pounds BMI:     34.89 Temp:     98.0 degrees F oral Pulse rate:   92 / minute Pulse rhythm:   regular Resp:     18 per minute BP sitting:   88 / 57  (left arm) Cuff size:   regular  Vitals Entered By: [place] Shannon (November 26, 2009 2:14 PM)  CC: three week f/u.... pt says she is very tired lately....  Is Patient Diabetic? Yes Pain Assessment Patient in pain? no      CBG Result 150  Does patient need assistance? Functional Status Self care Ambulation Normal   Primary Care Provider:  [facility]ins  CC:  three week f/u.... pt says she is very tired lately.... .  History of Present Illness: 1.  RA:  Saw Rheumatology at [facility] on 11/15/09.  To stay on 6 tabs of MTX 2.5 mg once weekly.  Pt. not aware of what she was to do in follow up with them other than a regular OV.  Cannot recall if she was to have labs every 6 weeks.  Last CBC end of March--25.  Pt. states labs were done at the Rheumatology appt. as well.  Not using Tramadol much now.    2.  DM:  Sugars running low 100s fasting.  Sugars never above 200 now.    3.  Feeling tired all the time--bp a bit low today.  No light headedness. Started yesterday, but also did not sleep well last night.  Pt. recently in hospital for Norovirus gastroenteritis.  Current Medications (verified): 1)  Protonix 40 Mg  Solr (Pantoprazole Sodium) .Marland Kitchen.. 1 By Mouth Once Daily 2)  Zoloft 100 Mg  Tabs (Sertraline Hcl) .Marland Kitchen.. 1 By Mouth Qday 3)  Tylenol Extra Strength 500 Mg  Tabs (Acetaminophen) .... 2 Tabs Po Q 4-6 Hrs As Needed For Fever,pain,headache. Not To Exceed 8 Tabs in 24hrs. 4)  Novolog Flexpen 100 Unit/ml Soln (Insulin Aspart) .... Sliding Scale Ac:  <100:  0 Units;  101-150: 4 U ; 151-200: 6 U; 201-250:8 U; 251-300: 10 U; 301-350: 12 U;351-400:14 U; 401-450: 16 U. 5)  Lantus Pen Needles .... Use With  Lantus Pen  Dx=250.3 Severe Risk Hypoglycemia 6)  Lantus For Opticlik 100 Unit/ml  Soln (Insulin Glargine) .... Inject 45 Units Subcutaneously  Every  Morning 7)  Reglan 10 Mg  Tabs (Metoclopramide Hcl) .... Take One Tablet Prior To Each Meal and At Bedtime 8)  Crestor 5 Mg  Tabs (Rosuvastatin Calcium) .... Take 1 Tab By Mouth Daily 9)  Tramadol Hcl 50 Mg Tabs (Tramadol Hcl) .... Take 1-2 Tablets By Mouth Every 6 Hours As Needed For Knee Pain. Can Also Be Taken With Tylenol. 10)  Pepcid 20 Mg Tabs (Famotidine) .... Take 1 Tablet By Mouth Every 12 Hours For Stomach 11)  Lisinopril 5 Mg Tabs (Lisinopril) .Marland Kitchen.. 1 Tab By Mouth Daily 12)  Plavix 75 Mg Tabs (Clopidogrel Bisulfate) .... One Tablet By Mouth Daily 13)  Metoprolol Tartrate 25 Mg Tabs (Metoprolol Tartrate) .... 1/2 Tablet By Mouth Two Times A Day 14)  Nitroglycerin 0.4 Mg/hr Pt24 (Nitroglycerin) .... Use As Directed 15)  Methotrexate 2.5 Mg Tabs (Methotrexate Sodium) .... 6 Tabs By Mouth Once Weekly--Take All Tabs At Same Time, Same Day Each Week 16)  Folic Acid  1 Mg Tabs (Folic Acid) .Marland Kitchen.. 1 Tab By Mouth Daily 17)  Promethazine Hcl 25 Mg Tabs (Promethazine Hcl) .... Take Every Six Hours As Needed For Nausea 18)  Prodigy Blood Glucose Monitor W/device Kit (Blood Glucose Monitoring Suppl) .... 4 Times Daily Blood Sugars 19)  Prodigy Blood Glucose Test  Strp (Glucose Blood) .... 4 Times Daily Blood Testing 20)  Prodigy Lancets 26g  Misc (Lancets) .... 4 Times Daily Blood Testing  Allergies (verified): 1)  ! Codeine  Physical Exam  General:  Looks well Lungs:  Normal respiratory effort, chest expands symmetrically. Lungs are clear to auscultation, no crackles or wheezes. Heart:  Normal rate and regular rhythm. S1 and S2 normal without gallop, murmur, click, rub or other extra sounds.  Radial pulses normal and equal Extremities:  No acute inflammation of any joints or synovial thickening.   Impression & Recommendations:  Problem # 1:   ARTHRITIS, RHEUMATOID (ICD-714.0) Currently not flaring.   Tolerating MTX fine EVery 6 week labs--will fax to Rheum Her updated medication list for this problem includes:    Methotrexate 2.5 Mg Tabs (Methotrexate sodium) .Marland KitchenMarland KitchenMarland KitchenMarland Kitchen 6 tabs by mouth once weekly--take all tabs at same time, same day each week  Problem # 2:  IDDM (ICD-250.01) I believe she had an A1C in hospital that was quite good, but will need to check back on that. Much better control since off corticosteroids for RA Her updated medication list for this problem includes:    Novolog Flexpen 100 Unit/ml Soln (Insulin aspart) ..... Sliding scale ac:  <100:  0 units;  101-150: 4 u ; 151-200: 6 u; 201-250:8 u; 251-300: 10 u; 301-350: 12 u;351-400:14 u; 401-450: 16 u.    Lantus For Opticlik 100 Unit/ml Soln (Insulin glargine) ..... Inject 45 units subcutaneously  every  morning    Lisinopril 5 Mg Tabs (Lisinopril) .Marland Kitchen... 1 tab by mouth daily  Problem # 3:  ESSENTIAL HYPERTENSION (ICD-401.9) Fine on recheck. Will recheck BP when comes in for labs end of month. Her updated medication list for this problem includes:    Lisinopril 5 Mg Tabs (Lisinopril) .Marland Kitchen... 1 tab by mouth daily    Metoprolol Tartrate 25 Mg Tabs (Metoprolol tartrate) .Marland Kitchen... 1/2 tablet by mouth two times a day  Complete Medication List: 1)  Protonix 40 Mg Solr (Pantoprazole sodium) .Marland Kitchen.. 1 by mouth once daily 2)  Zoloft 100 Mg Tabs (Sertraline hcl) .Marland Kitchen.. 1 by mouth qday 3)  Tylenol Extra Strength 500 Mg Tabs (Acetaminophen) .... 2 tabs po q 4-6 hrs as needed for fever,pain,headache. not to exceed 8 tabs in 24hrs. 4)  Novolog Flexpen 100 Unit/ml Soln (Insulin aspart) .... Sliding scale ac:  <100:  0 units;  101-150: 4 u ; 151-200: 6 u; 201-250:8 u; 251-300: 10 u; 301-350: 12 u;351-400:14 u; 401-450: 16 u. 5)  Lantus Pen Needles  .... Use with lantus pen  dx=250.3 severe risk hypoglycemia 6)  Lantus For Opticlik 100 Unit/ml Soln (Insulin glargine) .... Inject 45 units  subcutaneously  every  morning 7)  Reglan 10 Mg Tabs (Metoclopramide hcl) .... Take one tablet prior to each meal and at bedtime 8)  Simvastatin 40 Mg Tabs (Simvastatin) .Marland Kitchen.. 1 tab by mouth daily 9)  Tramadol Hcl 50 Mg Tabs (Tramadol hcl) .... Take 1-2 tablets by mouth every 6 hours as needed for knee pain. can also be taken with tylenol. 10)  Pepcid 20 Mg Tabs (Famotidine) .... Take 1 tablet by mouth every 12 hours for stomach 11)  Lisinopril 5 Mg Tabs (Lisinopril) .Marland Kitchen.. 1 tab by mouth daily 12)  Plavix 75 Mg Tabs (Clopidogrel bisulfate) .... One tablet by mouth daily 13)  Metoprolol Tartrate 25 Mg Tabs (Metoprolol tartrate) .... 1/2 tablet by mouth two times a day 14)  Nitroglycerin 0.4 Mg/hr Pt24 (Nitroglycerin) .... Use as directed 15)  Methotrexate 2.5 Mg Tabs (Methotrexate sodium) .... 6 tabs by mouth once weekly--take all tabs at same time, same day each week 16)  Folic Acid 1 Mg Tabs (Folic acid) .Marland Kitchen.. 1 tab by mouth daily 17)  Promethazine Hcl 25 Mg Tabs (Promethazine hcl) .... Take every six hours as needed for nausea 18)  Prodigy Blood Glucose Monitor W/device Kit (Blood glucose monitoring suppl) .... 4 times daily blood sugars 19)  Prodigy Blood Glucose Test Strp (Glucose blood) .... 4 times daily blood testing 20)  Prodigy Lancets 26g Misc (Lancets) .... 4 times daily blood testing  Patient Instructions: 1)  Schedule CBC and CMET for end of May, begiining of June--nurse visit. 2)  Follow up with Dr. Delrae Alfred in 3 months - DM

## 2010-09-01 NOTE — Assessment & Plan Note (Signed)
Summary: 2wk f/u w/cmet & cbc//////rjp   Vital Signs:  Patient profile:   45 year old female Menstrual status:  regular Weight:      180 pounds Temp:     98.0 degrees F Pulse rate:   109 / minute Pulse rhythm:   regular Resp:     20 per minute BP sitting:   129 / 74  (left arm) Cuff size:   regular  Vitals Entered By: Vesta Mixer CMA (November 05, 2009 2:05 PM) CC: 2 week f/u and d/c'd from hospital yesterday for norovirus Is Patient Diabetic? Yes Pain Assessment Patient in pain? no      CBG Result 165  Does patient need assistance? Ambulation Normal   Primary Care Provider:  [facility]  CC:  2 week f/u and d/c'd from hospital yesterday for norovirus.  History of Present Illness: 1.  Hospitalized  on 4/5/11for Norovirus:  stayed about 2 days, discharged yesterday.  Still with a bit of nausea.  Lost 6 lbs.  Able to take meds.  2.  DM:  Sugars running higher with illness.  Sugar in 300s this a.m., but had held Lantus night before as she was unable to eat.  165 this afternoon.  Prior to illness, sugars were running from 89-135.  Was not having any lows.    3.  RA:  Was on her last week of 4 MTX tabs before became ill.  We held her MTX yesterday secondary to illness.  CBC showed Hgb of 10.5.  Had acute prerenal failure initially, but resolved with hydration and BMET save for sugar was normal at discharge.  Has appt. with Rheumatology 11/15/09.  No real morning. stiffness of fingers.  No mouth ulcers, no hair loss.  4.  Anemia:  as above--suspect related to RA.  Iron studies, folate, Vitamin B12 all normal.  Allergies (verified): 1)  ! Codeine  Physical Exam  General:  NAD Lungs:  Normal respiratory effort, chest expands symmetrically. Lungs are clear to auscultation, no crackles or wheezes. Heart:  Normal rate and regular rhythm. S1 and S2 normal without gallop, murmur, click, rub or other extra sounds.  radial pulses normal and equal. Abdomen:  Bowel sounds positive,abdomen  soft and non-tender without masses, organomegaly or hernias noted. Extremities:  some mild synovial thickening of middle pip joints, cyst noted on right index finger PIP.  Good flexion--fist a bit loose still.   Impression & Recommendations:  Problem # 1:  GASTROENTERITIS, NOROVIRUS, ACUTE (ICD-008.8) Much improved. Holding MTX this week.  Problem # 2:  IDDM (ICD-250.01) Fairly stable despite illness CPM Her updated medication list for this problem includes:    Novolog Flexpen 100 Unit/ml Soln (Insulin aspart) ..... Sliding scale ac:  <100:  0 units;  101-150: 4 u ; 151-200: 6 u; 201-250:8 u; 251-300: 10 u; 301-350: 12 u;351-400:14 u; 401-450: 16 u.    Lantus For Opticlik 100 Unit/ml Soln (Insulin glargine) ..... Inject 45 units subcutaneously  every  morning    Lisinopril 5 Mg Tabs (Lisinopril) .Marland Kitchen... 1 tab by mouth daily  Problem # 3:  ARTHRITIS, RHEUMATOID (ICD-714.0) Quiescent currently--discussed likely not due to start of MTX yet. Restart MTX at 6 tabs next Wednesday. To Rheumatology on April 18  Her updated medication list for this problem includes:    Methotrexate 2.5 Mg Tabs (Methotrexate sodium) .Marland KitchenMarland KitchenMarland KitchenMarland Kitchen 6 tabs by mouth once weekly--take all tabs at same time, same day each week  Complete Medication List: 1)  Protonix 40 Mg Solr (Pantoprazole  sodium) .Marland Kitchen.. 1 by mouth once daily 2)  Zoloft 100 Mg Tabs (Sertraline hcl) .Marland Kitchen.. 1 by mouth qday 3)  Tylenol Extra Strength 500 Mg Tabs (Acetaminophen) .... 2 tabs po q 4-6 hrs as needed for fever,pain,headache. not to exceed 8 tabs in 24hrs. 4)  Novolog Flexpen 100 Unit/ml Soln (Insulin aspart) .... Sliding scale ac:  <100:  0 units;  101-150: 4 u ; 151-200: 6 u; 201-250:8 u; 251-300: 10 u; 301-350: 12 u;351-400:14 u; 401-450: 16 u. 5)  Lantus Pen Needles  .... Use with lantus pen  dx=250.3 severe risk hypoglycemia 6)  Lantus For Opticlik 100 Unit/ml Soln (Insulin glargine) .... Inject 45 units subcutaneously  every  morning 7)  Reglan 10  Mg Tabs (Metoclopramide hcl) .... Take one tablet prior to each meal and at bedtime 8)  Crestor 5 Mg Tabs (Rosuvastatin calcium) .... Take 1 tab by mouth daily 9)  Tramadol Hcl 50 Mg Tabs (Tramadol hcl) .... Take 1-2 tablets by mouth every 6 hours as needed for knee pain. can also be taken with tylenol. 10)  Pepcid 20 Mg Tabs (Famotidine) .... Take 1 tablet by mouth every 12 hours for stomach 11)  Lisinopril 5 Mg Tabs (Lisinopril) .Marland Kitchen.. 1 tab by mouth daily 12)  Plavix 75 Mg Tabs (Clopidogrel bisulfate) .... One tablet by mouth daily 13)  Metoprolol Tartrate 25 Mg Tabs (Metoprolol tartrate) .... 1/2 tablet by mouth two times a day 14)  Nitroglycerin 0.4 Mg/hr Pt24 (Nitroglycerin) .... Use as directed 15)  Methotrexate 2.5 Mg Tabs (Methotrexate sodium) .... 6 tabs by mouth once weekly--take all tabs at same time, same day each week 16)  Folic Acid 1 Mg Tabs (Folic acid) .Marland Kitchen.. 1 tab by mouth daily 17)  Prestige Test Strp (Glucose blood) .... 4 times daily testing 18)  Promethazine Hcl 25 Mg Tabs (Promethazine hcl) .... Take every six hours as needed for nausea  Other Orders: Capillary Blood Glucose/CBG (16109)  Patient Instructions: 1)  Start with 6 tabs of Methotrexate next Wednesday 2)  Follow up with Dr. Delrae Alfred in 3 weeks Prescriptions: TRAMADOL HCL 50 MG TABS (TRAMADOL HCL) Take 1-2 tablets by mouth every 6 hours as needed for knee pain. Can also be taken with tylenol.  #60 x 2   Entered and Authorized by:   Julieanne Manson MD   Signed by:   Julieanne Manson MD on 11/05/2009   Method used:   Print then Give to Patient   RxID:   [id number]

## 2010-09-01 NOTE — Initial Assessments (Signed)
Summary: H&P  INTERNAL MEDICINE TEACHING SERVICE ADMISSION HISTORY & PHYSICAL   Attending: Dr. Darlina Sicilian First contact: Dr. Tobie Lords [phone] Second contact: Dr. Threasa Beards [phone] (Weekends, after-hours: [phone], [phone])     PCP: Dr. Julieanne Manson, [facility]   CC: NVD, Carns blood sugar    HPI: 45 yo woman with DM on insulin, RA on methotrexate, h/o PSA presented to the ER this morning with one day history of acute onset NV and abdominal pain. Her husband and daughter are having similar problem for the past couple of days. She woke up early this am with generalized abdominal pain and vomited more than ten times mostly food particles. Also had loose stool more than ten times, loose watery, no blood. No fever or chills. She is doing all her meds as prescribed including methotrexate which was started recently by Dr. Delrae Alfred.   Allergies: ! CODEINE   Past Medical History:   IDDM   H/O DKA-MULTIPLE HOSP   DIABETIC GASTROPARESIS   ?RENAL INSUFFICIENCY: but Cr. normal on the system, 0.92 last month in clinic.     DIABETIC RETINOPATHY, peripheral neuropathy Rheumatoid arthritis on methotrexate HTN HL POLYSUBSTANCE ABUSE (COCAINE,ETOH) ETOH ABUSE   ALCOHOLIC GASTRTITS   HX PANCREATITIS ANEMIA GERD   SEVERE ESOPHAGEAL STENOSIS/ULCERATION Anxiety Depression Hyperlipidemia   Past Surgical History:  ESOPHAGOGASTRODUDENSCOPY WITH ESOPHAGEAL DILATION 07/2006.Dr.Edwards   Current Meds:  PROTONIX 40 MG  SOLR (PANTOPRAZOLE SODIUM) 1 by mouth once daily ZOLOFT 100 MG  TABS (SERTRALINE HCL) 1 by mouth qday TYLENOL EXTRA STRENGTH 500 MG  TABS (ACETAMINOPHEN) 2 tabs Po q 4-6 hrs as needed for fever,pain,headache. Not to exceed 8 tabs in 24hrs. NOVOLOG FLEXPEN 100 UNIT/ML SOLN (INSULIN ASPART) Sliding scale ac:  <100:  0 units;  101-150: 4 u ; 151-200: 6 u; 201-250:8 u; 251-300: 10 u; 301-350: 12 u;351-400:14 u; 401-450: 16 u. * LANTUS PEN NEEDLES Use with lantus pen  Dx=250.3 Severe risk  hypoglycemia LANTUS FOR OPTICLIK 100 UNIT/ML  SOLN (INSULIN GLARGINE) Inject 45 units Subcutaneously  every  morning VISTARIL 25 MG  CAPS (HYDROXYZINE PAMOATE) Take 1 capsule by mouth every 8 hours as needed for anxiety REGLAN 10 MG  TABS (METOCLOPRAMIDE HCL) Take one tablet prior to each meal and at bedtime CRESTOR 5 MG  TABS (ROSUVASTATIN CALCIUM) Take 1 tab by mouth daily TRAMADOL HCL 50 MG TABS (TRAMADOL HCL) Take 1-2 tablets by mouth every 6 hours as needed for knee pain. Can also be taken with tylenol. PEPCID 20 MG TABS (FAMOTIDINE) Take 1 tablet by mouth every 12 hours for stomach LISINOPRIL 5 MG TABS (LISINOPRIL) 1 tab by mouth daily PLAVIX 75 MG TABS (CLOPIDOGREL BISULFATE) One tablet by mouth daily METOPROLOL TARTRATE 25 MG TABS (METOPROLOL TARTRATE) 1/2 tablet by mouth two times a day NITROGLYCERIN 0.4 MG/HR PT24 (NITROGLYCERIN) Use as directed METHOTREXATE 2.5 MG TABS (METHOTREXATE SODIUM) 6 tabs by mouth once weekly--take all tabs at same time, same day each week FOLIC ACID 1 MG TABS (FOLIC ACID) 1 tab by mouth daily    Social History: Unemployed. 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 Still abstaining from drugs and alcohol use.08/2008    Family History:  Non contributory to the current problem.   Review of System: Negative except per HPI.    Vital Signs: BP-89/43 HR-93 RR-18 Temp-97.2 Sat-100% ra  Physical Exam: GEN- Not in distress HEENT- NCAT, PERRL, no icterus/pallor, mucus membrane dry LUNGS- clear to auscultation.   CVS- RRR, no murmur ABD- soft,  gen tender EXT- no cyanosis, clubbing or edema.  NEURO- no focal deficit. PSY- appropriate.    LAB RESULTS  LAB RESULTS            12.9 12.9 >-----------<424          ANC:  11.9               MCV:79.2     134        107       30 ------------------------------< 521     5.4         17       2.21   Calcium: 7.7       Anion gap:  12       Bilirubin: 104         Alk phos:  134       AST:     23    ALT: <8         Protein: 8.5       Albumin: 3.4  EKG: no acute changes.  ABG: 7.25/36.6/67/16           mEq/L    Lipase 23 Occult Blood Positive   ASSESSMENT & PLAN  This is a 45 yo woman w/  DKA: Likely precipitated by ongoing gastroenteritis. Will hydrate adequately and start insulin drip. Switch to regular regimen once gap closes. K highish, will recheck and supplement as needed.  Ac. gastroenteritis: This is most likely viral given h/o sick contact. Will manage conservatively. Expect to improve in a day or two. Keep NPO for now, hydration.  Renal impairment: this is prerenal. Should improve with hydration.  HyperK: borderline. Will recheck b-met.  Hold antihypertensives Hold P O meds until able to take by mouth  Prophylaxis: SCD  ATTENDING PHYSICIAN: I performed and/or observed a history and physical examination of the patient.  I discussed the case with the residents as noted and reviewed the residents' notes.  I agree with the findings and plan--please refer to the attending physician note for more details.  Signature________________________________  Printed Name_____________________________

## 2010-09-01 NOTE — Assessment & Plan Note (Signed)
Summary: 3 MONTH FU//KT   Vital Signs:  Patient profile:   45 year old female Menstrual status:  regular Weight:      175 pounds Temp:     97.2 degrees F Pulse rate:   84 / minute Pulse rhythm:   regular Resp:     18 per minute BP sitting:   108 / 70  (left arm) Cuff size:   regular  Vitals Entered By: Vesta Mixer CMA (February 25, 2010 9:11 AM) CC: f/u and having right breast/shouler pain Is Patient Diabetic? Yes Pain Assessment Patient in pain? yes     Location: rt breast CBG Result 221  Does patient need assistance? Ambulation Normal   Primary Care Provider:  [facility]  CC:  f/u and having right breast/shouler pain.  History of Present Illness: 1.  Right wrist , shoulder and left knee pain:  Rest of joints better after prednisone burst and taper.  Sounds like residual pain not felt related to RA--pt. followed up with Rheumatology on the 18th just as finishing burst and taper of prednisone and still with pain specifically in joints above.  Felt related to fall beginning of JUne.  Pt. has been to PT for knee--continues to swelll off and on.  Rheumatology would like her to go for PT of right shoulder and wrist.  Have not received their visit notes yet.    Pt. also asks about removal of cyst on right index finger--discussed  giving MTX time to stabilize her disease.  Encouraged her to leave it alone.  Allergies (verified): 1)  ! Codeine  Physical Exam  General:  NAD Extremities:  Much less stiffness of fingers, hands and wrists than at last visit--able to make relatively tight fists.  Possibly some synovial thickening of right wrist.  Possibly some mild tenderness of snuffbox on right.  Decreased ROM in all movements of wrist.  Very tender on palpation of right thoracic and cervical paraspinous musculature--especially thoracic as well as trap to shoulder.  Tender over [facility] and CC joint.  No significant tenderness over subacromial bursa.  Decreased flexion and abduction of  right shoulder  Mild effusion of left knee.  Minimal joint line tenderness.  Full flexion and extension.   Impression & Recommendations:  Problem # 1:  ARTHRITIS, RHEUMATOID (ICD-714.0) Better after burst and taper of corticosteroids Leave the joint cyst alone Her updated medication list for this problem includes:    Methotrexate 2.5 Mg Tabs (Methotrexate sodium) .Marland KitchenMarland KitchenMarland KitchenMarland Kitchen 6 tabs by mouth once weekly--take all tabs at same time, same day each week  Problem # 2:  SHOULDER PAIN, RIGHT (ICD-719.41) Not clear any rotator cuff injury Involving pec and trap/upper back musculature Her updated medication list for this problem includes:    Tylenol Extra Strength 500 Mg Tabs (Acetaminophen) .Marland Kitchen... 2 tabs po q 4-6 hrs as needed for fever,pain,headache. not to exceed 8 tabs in 24hrs.    Tramadol Hcl 50 Mg Tabs (Tramadol hcl) .Marland Kitchen... Take 1-2 tablets by mouth every 6 hours as needed for knee pain. can also be taken with tylenol.  Orders: Physical Therapy Referral (PT)  Problem # 3:  WRIST PAIN, RIGHT (ZOX-096.04)  Orders: Diagnostic X-Ray/Fluoroscopy (Diagnostic X-Ray/Flu) Physical Therapy Referral (PT)  Problem # 4:  KNEE INJURY, LEFT (ICD-959.7) MRI if does not continue to improve--has already had several PT sessions Medicaid denied MRI previously Orders: Physical Therapy Referral (PT)  Complete Medication List: 1)  Omeprazole 40 Mg Cpdr (Omeprazole) .Marland Kitchen.. 1 cap by mouth 1/2 hour before first  meal of day. 2)  Sertraline Hcl 100 Mg Tabs (Sertraline hcl) .Marland Kitchen.. 1 1/2 tabs by mouth daily 3)  Tylenol Extra Strength 500 Mg Tabs (Acetaminophen) .... 2 tabs po q 4-6 hrs as needed for fever,pain,headache. not to exceed 8 tabs in 24hrs. 4)  Novolog Flexpen 100 Unit/ml Soln (Insulin aspart) .... Sliding scale ac:  <100:  0 units;  101-150: 4 u ; 151-200: 6 u; 201-250:8 u; 251-300: 10 u; 301-350: 12 u;351-400:14 u; 401-450: 16 u. 5)  Lantus Pen Needles  .... Use with lantus pen  dx=250.3 severe risk  hypoglycemia 6)  Lantus For Opticlik 100 Unit/ml Soln (Insulin glargine) .... Inject 45 units subcutaneously  every  morning 7)  Reglan 10 Mg Tabs (Metoclopramide hcl) .... Take one tablet prior to each meal and at bedtime 8)  Simvastatin 40 Mg Tabs (Simvastatin) .Marland Kitchen.. 1 tab by mouth daily 9)  Tramadol Hcl 50 Mg Tabs (Tramadol hcl) .... Take 1-2 tablets by mouth every 6 hours as needed for knee pain. can also be taken with tylenol. 10)  Pepcid 20 Mg Tabs (Famotidine) .... Take 1 tablet by mouth every 12 hours for stomach 11)  Lisinopril 5 Mg Tabs (Lisinopril) .Marland Kitchen.. 1 tab by mouth daily 12)  Plavix 75 Mg Tabs (Clopidogrel bisulfate) .... One tablet by mouth daily 13)  Metoprolol Tartrate 25 Mg Tabs (Metoprolol tartrate) .... 1/2 tablet by mouth two times a day 14)  Nitroglycerin 0.4 Mg/hr Pt24 (Nitroglycerin) .... Use as directed 15)  Methotrexate 2.5 Mg Tabs (Methotrexate sodium) .... 6 tabs by mouth once weekly--take all tabs at same time, same day each week 16)  Folic Acid 1 Mg Tabs (Folic acid) .Marland Kitchen.. 1 tab by mouth daily 17)  Promethazine Hcl 25 Mg Tabs (Promethazine hcl) .... Take every six hours as needed for nausea 18)  Prodigy Blood Glucose Monitor W/device Kit (Blood glucose monitoring suppl) .... 4 times daily blood sugars 19)  Prodigy Blood Glucose Test Strp (Glucose blood) .... 4 times daily blood testing 20)  Prodigy Lancets 26g Misc (Lancets) .... 4 times daily blood testing  Other Orders: Capillary Blood Glucose/CBG (04540)  Patient Instructions: 1)  Cancel any other OV follow ups with me--please schedule Ov for follow up of joint issues with me in 2 months

## 2010-09-01 NOTE — Miscellaneous (Signed)
Summary: Rehab Report//PROGRESS NOTE  Rehab Report//PROGRESS NOTE   Imported By: Arta Bruce 04/29/2010 15:16:12  _____________________________________________________________________  External Attachment:    Type:   Image     Comment:   External Document

## 2010-09-01 NOTE — Assessment & Plan Note (Addendum)
Summary: 4 MONTH FU FOR CPP///KT   Vital Signs:  Patient profile:   45 year old female Menstrual status:  regular Height:      60 inches Weight:      175 pounds BMI:     34.30 Temp:     96.8 degrees F Pulse rate:   78 / minute Pulse rhythm:   regular Resp:     18 per minute BP sitting:   100 / 64  (left arm) Cuff size:   regular  Vitals Entered By: [place] Shannon (August 25, 2010 8:46 AM) CC: cpp Is Patient Diabetic? Yes Pain Assessment Patient in pain? no      CBG Result 184  Does patient need assistance? Functional Status Self care Ambulation Normal  Vision Screening:Left eye w/o correction: 20 / 50 Both eyes w/o correction:  20/ 50       Vision Comments: pt unable to see 20/100 in right eye  Vision Entered By: Armenia Shannon (August 25, 2010 8:55 AM)   Primary Care Provider:  [facility]  CC:  cpp.  History of Present Illness: 45 yo female here for CPP.  Concerns:    1.  Urine leakage every time she coughs.  Has been having a cough and congestion for 1 month.  Lot of drainage down back of throat--bad taste.  Sneezing.  Mucous from nose and cough is yellow.  Feels feverish off and on.  Using Benadryl and Alka seltzer.    Allergies: 1)  ! 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  01/20/09:  Cardiac cath with stenting of RCA following AMI-inferior-Dr. Eldridge Dace 2.  07/2006:  ESOPHAGOGASTRODUDENSCOPY WITH ESOPHAGEAL DILATION   .Dr.Edwards  Family History: Mother, died 40:  Breast cancer, Father, died 59:  Hypertension, died of gangrene of leg Brother, died 28:  GSW Sister, died 55:  Stomach cancer Sister, died 59:  Unknown cancer. 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 Tobacco Use:  Started age 2.  1/2 ppd.  Went to tobacco cessation class and tried the patches, but states got a bad taste in her mouth with it.  Did not like the gum.  Not interested in Chantix currently. Alcohol:  Hx of abuse.  Abstaining since 2008 Drugs:  Crack cocaine and MJ--none since 2008  Review of Systems General:  Energy not good.. Eyes:  Blind in right eye.   Pt. states had a problem from childhood and just deteriorated over time. Last eye check was in 2010.  Goes to someone on [employer]. Plans to make an appt.. ENT:  Denies decreased hearing. CV:  Complains of bluish discoloration of lips or nails; denies chest pain or discomfort; Breasts feel uncomfortable, heavy, but no actual chest pain Occasional palpitations--short lived . Resp:  Denies shortness of breath. GI:  Denies bloody stools and dark tarry stools; Some cramping in Right upper quadrant with cough for past 1 month. Loose stools--since on insulin per pt..  No constipation. GU:  White vaginal discharge with itching recently.  Mild odor Urination is fine as long as not coughing.. MS:  Has RA--doing well.   Goes to [facility].  MTX increased to 15 mg  two times a day on Wednesdays.  Continues PT exercises for shoulder and knee at home.. Derm:  Denies lesion(s) and rash; Corn on left foot.  Checks feet nightly.  Has not had a podiatry evaluation.. Neuro:  Occasional numbness and tingling in feet.  Marland Kitchen Psych:  Denies depression and suicidal thoughts/plans.  Physical Exam  General:  Overweight, NAD Head:  Normocephalic and atraumatic without obvious abnormalities. No apparent alopecia or balding. Eyes:  Difficult performing disc exam on right as pupil small.  Similar problem with left exam, though able to make out sharp disc, and what appear  to be normal vessels.  No obvious exudates.   Ears:  External ear exam shows no significant lesions or deformities.  Otoscopic examination reveals clear canals, tympanic membranes are intact bilaterally without bulging, retraction, inflammation or discharge. Hearing is grossly normal bilaterally. Nose:  mucosal swelling with yellow discharge Mouth:  Partial upper, MMM, no oral lesions.  pharynx pink and moist.   Neck:  No deformities, masses, or tenderness noted. Breasts:  No mass, nodules, thickening, tenderness, bulging, retraction, inflamation, nipple discharge or skin changes noted.   Lungs:  Normal respiratory effort, chest expands symmetrically. Lungs are clear to auscultation, no crackles or wheezes. Heart:  Normal rate and regular rhythm. S1 and S2 normal without gallop, murmur, click, rub or other extra sounds. Abdomen:  Bowel sounds positive,abdomen soft and non-tender without masses, organomegaly or hernias noted. Rectal:  No external abnormalities noted. Normal sphincter tone. No rectal masses or tenderness.  Heme negative light brown stool Genitalia:  Pelvic Exam:        External: normal female genitalia without lesions or masses        Vagina: normal without lesions or masses        Cervix: normal without lesions or masses        Adnexa: normal bimanual exam without masses or fullness        Uterus: normal by palpation        Pap smear: performed Msk:  No deformity or scoliosis noted of thoracic or lumbar spine.   Pulses:  R and L carotid,radial,femoral,dorsalis pedis and posterior tibial pulses are full and equal bilaterally Extremities:  cystic lesion overlying PIP of right index finger.  Lumpy soft tissue lesion overlying extensor surface of left elbow as well.  Callous formation with flaking over plantar lateral distal left foot and also dorsal right 5th toe.  Nails are thickened and yellowed Neurologic:  No cranial nerve deficits noted. Station and gait are normal. Plantar  reflexes are down-going bilaterally. DTRs are symmetrical throughout. Sensory, motor and coordinative functions appear intact. Skin:  Intact without suspicious lesions or rashes Cervical Nodes:  No lymphadenopathy noted Axillary Nodes:  No palpable lymphadenopathy Inguinal Nodes:  No significant adenopathy Psych:  Cognition and judgment appear intact. Alert and cooperative with normal attention span and concentration. No apparent delusions, illusions, hallucinations  Diabetes Management Exam:    Foot Exam (with socks and/or shoes not present):       Sensory-Monofilament:          Left foot: normal          Right foot: normal   Impression & Recommendations:  Problem # 1:  ROUTINE GYNECOLOGICAL EXAMINATION (ICD-V72.31) Mammogram scheduled for tomorrow already Orders: KOH/ WET Mount [id number]) Pap Smear, Thin Prep ( Collection of) (U0454) UA Dipstick w/o Micro (automated)  (81003) T- GC Chlamydia (09811) T-HIV Antibody  (Reflex) ([id number]) T-Syphilis Test (RPR) (13086-57846)  Problem # 2:  HEALTH MAINTENANCE EXAM (ICD-V70.0) Hemoccult cards x 3 to return in 2 weeks. Immunizations up to date  Problem # 3:  IDDM (ICD-250.01) A1C 7.3%--pretty good for pt. Recent elevations as well with illness Her updated medication list for this problem includes:    Novolog Flexpen 100 Unit/ml Soln (Insulin aspart) ..... Sliding scale ac:  <100:  0 units;  101-150: 4 u ; 151-200: 6 u; 201-250:8 u; 251-300: 10 u; 301-350: 12 u;351-400:14 u; 401-450: 16 u.    Lantus For Opticlik 100 Unit/ml Soln (Insulin glargine) ..... Inject 45 units subcutaneously  every  morning    Lisinopril 5 Mg Tabs (Lisinopril) .Marland Kitchen... 1 tab by mouth daily  Orders: UA Dipstick w/o Micro (automated)  (81003) Hemoglobin A1C (83036) Capillary Blood Glucose/CBG (60454) T-Urine Microalbumin w/creat. ratio [phone])  Problem # 4:  SINUSITIS, ACUTE (ICD-461.9)  The following medications were removed from the medication  list:    Suprax 400 Mg Tabs (Cefixime) .Marland Kitchen... 1 tab by mouth daily for 7 days Her updated medication list for this problem includes:    Azithromycin 250 Mg Tabs (Azithromycin) .Marland Kitchen... 2 tabs by mouth today, then 1 tab by mouth daily for 4 more days  Problem # 5:  CAD (ICD-414.00) Stable Her updated medication list for this problem includes:    Lisinopril 5 Mg Tabs (Lisinopril) .Marland Kitchen... 1 tab by mouth daily    Plavix 75 Mg Tabs (Clopidogrel bisulfate) ..... One tablet by mouth daily    Metoprolol Tartrate 25 Mg Tabs (Metoprolol tartrate) .Marland Kitchen... 1/2 tablet by mouth two times a day    Nitroglycerin 0.4 Mg/hr Pt24 (Nitroglycerin) .Marland Kitchen... 1 tab sublingual as needed chest pain.  may repeat every 5 minutes x2 if not relieved.  Orders: T-Lipid Profile ([id number])  Complete Medication List: 1)  Omeprazole 40 Mg Cpdr (Omeprazole) .Marland Kitchen.. 1 cap by mouth 1/2 hour before first meal of day. 2)  Sertraline Hcl 100 Mg Tabs (Sertraline hcl) .Marland Kitchen.. 1 1/2 tabs by mouth daily 3)  Novolog Flexpen 100 Unit/ml Soln (Insulin aspart) .... Sliding scale ac:  <100:  0 units;  101-150: 4 u ; 151-200: 6 u; 201-250:8 u; 251-300: 10 u; 301-350: 12 u;351-400:14 u; 401-450: 16 u. 4)  Lantus Pen Needles  .... Use with lantus pen  dx=250.3 severe risk hypoglycemia 5)  Lantus For Opticlik 100 Unit/ml Soln (Insulin glargine) .... Inject 45 units subcutaneously  every  morning 6)  Reglan 10 Mg Tabs (Metoclopramide hcl) .... Take one tablet prior to each meal and at bedtime 7)  Pravastatin Sodium 40 Mg Tabs (Pravastatin sodium) .Marland Kitchen.. 1 tab by mouth daily with meal 8)  Tramadol Hcl 50 Mg Tabs (Tramadol hcl) .... Take 1-2 tablets by mouth every 6 hours as needed for knee pain. can also be taken with tylenol. 9)  Lisinopril 5 Mg Tabs (Lisinopril) .Marland Kitchen.. 1 tab by mouth daily 10)  Plavix 75 Mg Tabs (Clopidogrel bisulfate) .... One tablet by mouth daily 11)  Metoprolol Tartrate 25 Mg Tabs (Metoprolol tartrate) .... 1/2 tablet by mouth two times a  day 12)  Nitroglycerin 0.4 Mg/hr Pt24 (Nitroglycerin) .Marland Kitchen.. 1 tab sublingual as needed chest pain.  may repeat every 5 minutes x2 if not relieved. 13)  Trexall 5 Mg Tabs (Methotrexate sodium) .... 3 tabs by mouth two times a day on wednesdays--[facility] 14)  Folic Acid 1 Mg Tabs (Folic acid) .Marland Kitchen.. 1 tab by mouth daily 15)  Prodigy Blood Glucose Monitor W/device Kit (Blood glucose monitoring suppl) .... 4 times daily blood  sugars 16)  Prodigy Blood Glucose Test Strp (Glucose blood) .... 4 times daily blood testing 17)  Prodigy Lancets 26g Misc (Lancets) .... 4 times daily blood testing 18)  Ferrous Sulfate 325 (65 Fe) Mg Tabs (Ferrous sulfate) .Marland Kitchen.. 1 tab by mouth daily 19)  Azithromycin 250 Mg Tabs (Azithromycin) .... 2 tabs by mouth today, then 1 tab by mouth daily for 4 more days  Other Orders: T-Comprehensive Metabolic Panel ([id number]) T-CBC w/Diff (09811-91478) Podiatry Referral (Podiatry)  Patient Instructions: 1)  Please make a lab appt. every 6 weeks--CBC, CMET--fax to [facility] 2)  Follow up with Dr. Delrae Alfred in 4 months -IDDM Prescriptions: FOLIC ACID 1 MG TABS (FOLIC ACID) 1 tab by mouth daily  #30 x 11   Entered and Authorized by:   Julieanne Manson MD   Signed by:   Julieanne Manson MD on 08/25/2010   Method used:   Electronically to        [facility] [phone]* (retail)       [address]       [place], [place]  [address]       Ph: [phone]       Fax: [phone]   RxID:   [id number] METOPROLOL TARTRATE 25 MG TABS (METOPROLOL TARTRATE) 1/2 tablet by mouth two times a day  #30 x 11   Entered and Authorized by:   Julieanne Manson MD   Signed by:   Julieanne Manson MD on 08/25/2010   Method used:   Electronically to        [facility] [phone]* (retail)       [address]       [place], [place]  [address]       Ph: [phone]       Fax: [phone]   RxID:   [id number] PLAVIX 75 MG TABS (CLOPIDOGREL BISULFATE) One tablet by  mouth daily  #30 x 11   Entered and Authorized by:   Julieanne Manson MD   Signed by:   Julieanne Manson MD on 08/25/2010   Method used:   Electronically to        [facility] [phone]* (retail)       [address]       [place], [place]  [address]       Ph: [phone]       Fax: [phone]   RxID:   [id number] LISINOPRIL 5 MG TABS (LISINOPRIL) 1 tab by mouth daily  #30 x 11   Entered and Authorized by:   Julieanne Manson MD   Signed by:   Julieanne Manson MD on 08/25/2010   Method used:   Electronically to        [facility] [phone]* (retail)       [address]       [place], [place]  [address]       Ph: [phone]       Fax: [phone]   RxID:   [id number] PRAVASTATIN SODIUM 40 MG TABS (PRAVASTATIN SODIUM) 1 tab by mouth daily with meal  #30 x 11   Entered and Authorized by:   Julieanne Manson MD   Signed by:   Julieanne Manson MD on 08/25/2010   Method used:   Electronically to        [facility] [phone]* (retail)       [address]       [place], [place]  [address]       Ph: [phone]  Fax: [phone]   RxID:   [id number] REGLAN 10 MG  TABS (METOCLOPRAMIDE HCL) Take one tablet prior to each meal and at bedtime  #120 x 11   Entered and Authorized by:   Julieanne Manson MD   Signed by:   Julieanne Manson MD on 08/25/2010   Method used:   Electronically to        [facility] [phone]* (retail)       [address]       [place], [place]  [address]       Ph: [phone]       Fax: [phone]   RxID:   [id number] LANTUS FOR OPTICLIK 100 UNIT/ML  SOLN (INSULIN GLARGINE) Inject 45 units Subcutaneously  every  morning  #1 month x 11   Entered and Authorized by:   Julieanne Manson MD   Signed by:   Julieanne Manson MD on 08/25/2010   Method used:   Electronically to        [facility] [phone]* (retail)       [address]       [place], [place]  [address]       Ph: [phone]       Fax:  [phone]   RxID:   [id number] NOVOLOG FLEXPEN 100 UNIT/ML SOLN (INSULIN ASPART) Sliding scale ac:  <100:  0 units;  101-150: 4 u ; 151-200: 6 u; 201-250:8 u; 251-300: 10 u; 301-350: 12 u;351-400:14 u; 401-450: 16 u.  #1 month x 11   Entered and Authorized by:   Julieanne Manson MD   Signed by:   Julieanne Manson MD on 08/25/2010   Method used:   Electronically to        [facility] [phone]* (retail)       [address]       [place], [place]  [address]       Ph: [phone]       Fax: [phone]   RxID:   [id number] SERTRALINE HCL 100 MG TABS (SERTRALINE HCL) 1 1/2 tabs by mouth daily  #45 Each x 11   Entered and Authorized by:   Julieanne Manson MD   Signed by:   Julieanne Manson MD on 08/25/2010   Method used:   Electronically to        [facility] [phone]* (retail)       [address]       [place], [place]  [address]       Ph: [phone]       Fax: [phone]   RxID:   [id number] AZITHROMYCIN 250 MG TABS (AZITHROMYCIN) 2 tabs by mouth today, then 1 tab by mouth daily for 4 more days  #6 x 0   Entered and Authorized by:   Julieanne Manson MD   Signed by:   Julieanne Manson MD on 08/25/2010   Method used:   Electronically to        [facility] [phone]* (retail)       [address]       [place], [place]  [address]       Ph: [phone]       Fax: [phone]   RxID:   [id number]    Orders Added: 1)  Est. Patient age 59-64 [76] 2)  KOH/ WET Mount [phone] 3)  Pap Smear, Thin Prep ( Collection of) [Q0091] 4)  UA Dipstick w/o Micro (automated)  [81003] 5)  Hemoglobin A1C [83036] 6)  Capillary Blood  Glucose/CBG [82948] 7)  T- GC Chlamydia [16109] 8)  T-HIV Antibody  (Reflex) [60454-09811] 9)  T-Syphilis Test (RPR) [91478-29562] 10)  T-Comprehensive Metabolic Panel [80053-22900] 11)  T-CBC w/Diff [13086-57846] 12)  T-Lipid Profile [80061-22930] 13)  T-Urine Microalbumin w/creat. ratio [82043-[address]] 14)   Podiatry Referral [Podiatry]    Preventive Care Screening  Prior Values:    Pap Smear:  Done (10/31/2005)    Mammogram:  BI-RADS CATEGORY 1:  Negative.^MM DIGITAL DIAGNOSTIC BILAT (08/16/2009)    Last Tetanus Booster:  Td (06/02/2004)    Last Flu Shot:  Fluvax 3+ (04/28/2010)    Last Pneumovax:  Pneumovax (10/31/2005)     Mammogram scheduled for tomorrow morning SBE:  monthly--no changes LMP:  08/04/10--regular, no problems Osteoprevention:  Little dairy, no calcium or Vitamin D supplements.  Not very physically active. Guaiac cards:  Has done beforfe--no record in chart Colonscopy:  Never    Laboratory Results   Urine Tests  Date/Time Received: August 25, 2010 10:01 AM   Routine Urinalysis   Color: brown Appearance: Clear Glucose: negative   (Normal Range: Negative) Bilirubin: small   (Normal Range: Negative) Ketone: trace (5)   (Normal Range: Negative) Spec. Gravity: >=1.030   (Normal Range: 1.003-1.035) Blood: trace-intact   (Normal Range: Negative) pH: 5.0   (Normal Range: 5.0-8.0) Protein: >=300   (Normal Range: Negative) Urobilinogen: 1.0   (Normal Range: 0-1) Nitrite: negative   (Normal Range: Negative) Leukocyte Esterace: negative   (Normal Range: Negative)     Blood Tests   Date/Time Received: August 25, 2010 10:03 AM   HGBA1C: 7.3%   (Normal Range: Non-Diabetic - 3-6%   Control Diabetic - 6-8%) CBG Random:: 184mg /dL  Date/Time Received: August 25, 2010 10:25 AM   Other Tests  Rapid HIV: negative     Diabetic Foot Exam    10-g (5.07) Semmes-Weinstein Monofilament Test Performed by: Hale Drone CMA          Right Foot          Left Foot Visual Inspection     normal         normal Test Control      normal         normal Site 1         normal         normal Site 2         normal         normal Site 3         normal         normal Site 4         normal         normal Site 5         normal         normal Site 6         normal          normal Site 7         normal         normal Site 8         normal         normal Site 9         normal         normal Site 10         normal         abnormal  Impression      normal         normal  Appended Document: Hemoccult    Lab Visit  Laboratory Results  Date/Time Received: September 01, 2010 5:51 PM   Stool - Occult Blood Hemmoccult #1: negative Date: 09/01/2010 Hemoccult #2: negative Date: 09/01/2010 Hemoccult #3: negative Date: 09/01/2010  Orders Today:

## 2010-09-01 NOTE — Miscellaneous (Signed)
Summary: hosp d/c  Hospital Discharge  Date of admission: 11/01/09  Date of discharge: 11/04/09  Brief reason for admission/active problems: Pt was admitted for metabolic acidosis consistent with diarrhea etiology 2/2 viral gastroenteritis and hyperglycemia. Her pH on admission was 7.25 with a low bicarb and glucose >500, but anion gap was normal. She was hydrated and over the course of 3 days her n/v/d improved significantly.  Followup needed: She will follow-up with Dr. Delrae Alfred on Friday, April 8 @ 2pm. At this appointment, please ensure Ms. Roediger's symptoms have continued to improve. Please note that Ms. Hitchens had a + FOBT on admission without frank blood on rectal exam. She did have a 12.9 --> 10.4 drop in hemoglobin between admission and discharge, which was felt to be largely due to large amount of IVF given her GI symptoms. However, this should be followed up and a repeat CBC done. Also, please note that she had a mild troponin leak (max = 0.07) with normal CK and CKMB and no EKG changes during hospitalization. A radionuclide cardiac scan is suggested.  The medication and problem lists have been updated.  Please see the dictated discharge summary for details.   Complete Medication List: 1)  Protonix 40 Mg Solr (Pantoprazole sodium) .Marland Kitchen.. 1 by mouth once daily 2)  Zoloft 100 Mg Tabs (Sertraline hcl) .Marland Kitchen.. 1 by mouth qday 3)  Tylenol Extra Strength 500 Mg Tabs (Acetaminophen) .... 2 tabs po q 4-6 hrs as needed for fever,pain,headache. not to exceed 8 tabs in 24hrs. 4)  Novolog Flexpen 100 Unit/ml Soln (Insulin aspart) .... Sliding scale ac:  <100:  0 units;  101-150: 4 u ; 151-200: 6 u; 201-250:8 u; 251-300: 10 u; 301-350: 12 u;351-400:14 u; 401-450: 16 u. 5)  Lantus Pen Needles  .... Use with lantus pen  dx=250.3 severe risk hypoglycemia 6)  Lantus For Opticlik 100 Unit/ml Soln (Insulin glargine) .... Inject 45 units subcutaneously  every  morning 7)  Vistaril 25 Mg Caps (Hydroxyzine  pamoate) .... Take 1 capsule by mouth every 8 hours as needed for anxiety 8)  Reglan 10 Mg Tabs (Metoclopramide hcl) .... Take one tablet prior to each meal and at bedtime 9)  Crestor 5 Mg Tabs (Rosuvastatin calcium) .... Take 1 tab by mouth daily 10)  Tramadol Hcl 50 Mg Tabs (Tramadol hcl) .... Take 1-2 tablets by mouth every 6 hours as needed for knee pain. can also be taken with tylenol. 11)  Pepcid 20 Mg Tabs (Famotidine) .... Take 1 tablet by mouth every 12 hours for stomach 12)  Lisinopril 5 Mg Tabs (Lisinopril) .Marland Kitchen.. 1 tab by mouth daily 13)  Plavix 75 Mg Tabs (Clopidogrel bisulfate) .... One tablet by mouth daily 14)  Metoprolol Tartrate 25 Mg Tabs (Metoprolol tartrate) .... 1/2 tablet by mouth two times a day 15)  Nitroglycerin 0.4 Mg/hr Pt24 (Nitroglycerin) .... Use as directed 16)  Methotrexate 2.5 Mg Tabs (Methotrexate sodium) .... 6 tabs by mouth once weekly--take all tabs at same time, same day each week 17)  Folic Acid 1 Mg Tabs (Folic acid) .Marland Kitchen.. 1 tab by mouth daily 18)  Prestige Test Strp (Glucose blood) .... 4 times daily testing 19)  Promethazine Hcl 25 Mg Tabs (Promethazine hcl) .... Take every six hours as needed for nausea   Prescriptions: PROMETHAZINE HCL 25 MG TABS (PROMETHAZINE HCL) take every six hours as needed for nausea  #20 x 0   Entered and Authorized by:   Silvestre Gunner MD   Signed by:  Tenea Sens MD on 11/04/2009   Method used:   Electronically to        [facility] [phone]* (retail)       [address]       [place], [place]  [address]       Ph: [phone]       Fax: [phone]   RxID:   [id number]   Prescriptions: PROMETHAZINE HCL 25 MG TABS (PROMETHAZINE HCL) take every six hours as needed for nausea  #20 x 0   Entered and Authorized by:   Silvestre Gunner MD   Signed by:   Silvestre Gunner MD on 11/04/2009   Method used:   Electronically to        [facility] [phone]* (retail)       [address]       [place], [place]   [address]       Ph: [phone]       Fax: [phone]   RxID:   [id number]    Patient Instructions: 1)  You have a follow-up appointment with Dr. Delrae Alfred on Friday, April 8 @ 2 pm. 2)  I have prescribed Phenergan for you. Take it as directed, as needed for nausea. 3)  Please note that you may continue to have nausea and/or vomiting or diarrhea for the next couple days. This would not be unexpected as these symptoms can take 5-7 days to completely resolve. 4)  Please call [facility] if you have any questions or concerns prior to your appointment.   Complete Medication List: 1)  Protonix 40 Mg Solr (Pantoprazole sodium) .Marland Kitchen.. 1 by mouth once daily 2)  Zoloft 100 Mg Tabs (Sertraline hcl) .Marland Kitchen.. 1 by mouth qday 3)  Tylenol Extra Strength 500 Mg Tabs (Acetaminophen) .... 2 tabs po q 4-6 hrs as needed for fever,pain,headache. not to exceed 8 tabs in 24hrs. 4)  Novolog Flexpen 100 Unit/ml Soln (Insulin aspart) .... Sliding scale ac:  <100:  0 units;  101-150: 4 u ; 151-200: 6 u; 201-250:8 u; 251-300: 10 u; 301-350: 12 u;351-400:14 u; 401-450: 16 u. 5)  Lantus Pen Needles  .... Use with lantus pen  dx=250.3 severe risk hypoglycemia 6)  Lantus For Opticlik 100 Unit/ml Soln (Insulin glargine) .... Inject 45 units subcutaneously  every  morning 7)  Vistaril 25 Mg Caps (Hydroxyzine pamoate) .... Take 1 capsule by mouth every 8 hours as needed for anxiety 8)  Reglan 10 Mg Tabs (Metoclopramide hcl) .... Take one tablet prior to each meal and at bedtime 9)  Crestor 5 Mg Tabs (Rosuvastatin calcium) .... Take 1 tab by mouth daily 10)  Tramadol Hcl 50 Mg Tabs (Tramadol hcl) .... Take 1-2 tablets by mouth every 6 hours as needed for knee pain. can also be taken with tylenol. 11)  Pepcid 20 Mg Tabs (Famotidine) .... Take 1 tablet by mouth every 12 hours for stomach 12)  Lisinopril 5 Mg Tabs (Lisinopril) .Marland Kitchen.. 1 tab by mouth daily 13)  Plavix 75 Mg Tabs (Clopidogrel bisulfate) .... One tablet by mouth  daily 14)  Metoprolol Tartrate 25 Mg Tabs (Metoprolol tartrate) .... 1/2 tablet by mouth two times a day 15)  Nitroglycerin 0.4 Mg/hr Pt24 (Nitroglycerin) .... Use as directed 16)  Methotrexate 2.5 Mg Tabs (Methotrexate sodium) .... 6 tabs by mouth once weekly--take all tabs at same time, same day each week 17)  Folic Acid 1 Mg Tabs (Folic acid) .Marland Kitchen.. 1 tab by mouth daily 18)  Prestige Test Strp (Glucose blood) .Marland KitchenMarland KitchenMarland Kitchen  4 times daily testing 19)  Promethazine Hcl 25 Mg Tabs (Promethazine hcl) .... Take every six hours as needed for nausea

## 2010-09-01 NOTE — Miscellaneous (Signed)
Summary: Rehab Report//DISCHARGE SUMMARY  Rehab Report//DISCHARGE SUMMARY   Imported By: Arta Bruce 06/08/2010 14:25:02  _____________________________________________________________________  External Attachment:    Type:   Image     Comment:   External Document

## 2010-09-01 NOTE — Assessment & Plan Note (Signed)
Summary: 2 month fu///kt   Vital Signs:  Patient profile:   45 year old female Menstrual status:  regular Height:      61 inches Weight:      175 pounds BMI:     33.19 Temp:     96.9 degrees F oral Pulse rate:   86 / minute Pulse rhythm:   regular Resp:     18 per minute BP sitting:   130 / 76  (left arm) Cuff size:   regular  Vitals Entered By: Armenia Shannon (April 28, 2010 2:12 PM) CC: family history of colon /u... Is Patient Diabetic? No Pain Assessment Patient in pain? no      CBG Result 182  Does patient need assistance? Functional Status Self care Ambulation Normal   Primary Care Provider:  [facility]  CC:  family history of colon /u....  History of Present Illness: 1.  Right shoulder pain:  has improved with PT, but still a fair amt of discomfort.  Getting what sounds like iontophoresis.  Pain comes back after takes patch off and feels "doped up"  when takes Tramadol.  Left knee no longer bothering her.  2.  RA:  Arthritis has been well controlled.  Next appt. with Rheum, she believes is in February.  3.  Anemia:  stable--work up has not been revealing--suspect secondary to RA.  4.  Belching--bad taste.  Not taking Omeprazole on an empty stomach as prescribed.  5.  DM:  Always in the 100s.  HIghest since last here was 197.  Had a low when was delayed in eating and passed out.  Sounds like she was off on giving her insulin as well.  Has not been to [facility] for eye check yet. this year.  Current Medications (verified): 1)  Omeprazole 40 Mg Cpdr (Omeprazole) .Marland Kitchen.. 1 Cap By Mouth 1/2 Hour Before First Meal of Day. 2)  Sertraline Hcl 100 Mg Tabs (Sertraline Hcl) .Marland Kitchen.. 1 1/2 Tabs By Mouth Daily 3)  Tylenol Extra Strength 500 Mg  Tabs (Acetaminophen) .... 2 Tabs Po Q 4-6 Hrs As Needed For Fever,pain,headache. Not To Exceed 8 Tabs in 24hrs. 4)  Novolog Flexpen 100 Unit/ml Soln (Insulin Aspart) .... Sliding Scale Ac:  <100:  0 Units;  101-150: 4 U ;  151-200: 6 U; 201-250:8 U; 251-300: 10 U; 301-350: 12 U;351-400:14 U; 401-450: 16 U. 5)  Lantus Pen Needles .... Use With Lantus Pen  Dx=250.3 Severe Risk Hypoglycemia 6)  Lantus For Opticlik 100 Unit/ml  Soln (Insulin Glargine) .... Inject 45 Units Subcutaneously  Every  Morning 7)  Reglan 10 Mg  Tabs (Metoclopramide Hcl) .... Take One Tablet Prior To Each Meal and At Bedtime 8)  Simvastatin 40 Mg Tabs (Simvastatin) .Marland Kitchen.. 1 Tab By Mouth Daily 9)  Tramadol Hcl 50 Mg Tabs (Tramadol Hcl) .... Take 1-2 Tablets By Mouth Every 6 Hours As Needed For Knee Pain. Can Also Be Taken With Tylenol. 10)  Pepcid 20 Mg Tabs (Famotidine) .... Take 1 Tablet By Mouth Every 12 Hours For Stomach 11)  Lisinopril 5 Mg Tabs (Lisinopril) .Marland Kitchen.. 1 Tab By Mouth Daily 12)  Plavix 75 Mg Tabs (Clopidogrel Bisulfate) .... One Tablet By Mouth Daily 13)  Metoprolol Tartrate 25 Mg Tabs (Metoprolol Tartrate) .... 1/2 Tablet By Mouth Two Times A Day 14)  Nitroglycerin 0.4 Mg/hr Pt24 (Nitroglycerin) .Marland Kitchen.. 1 Tab Sublingual As Needed Chest Pain.  May Repeat Every 5 Minutes X2 If Not Relieved. 15)  Methotrexate 2.5 Mg Tabs (Methotrexate Sodium) .Marland KitchenMarland KitchenMarland Kitchen  6 Tabs By Mouth Once Weekly--Take All Tabs At Same Time, Same Day Each Week 16)  Folic Acid 1 Mg Tabs (Folic Acid) .Marland Kitchen.. 1 Tab By Mouth Daily 17)  Promethazine Hcl 25 Mg Tabs (Promethazine Hcl) .... Take Every Six Hours As Needed For Nausea 18)  Prodigy Blood Glucose Monitor W/device Kit (Blood Glucose Monitoring Suppl) .... 4 Times Daily Blood Sugars 19)  Prodigy Blood Glucose Test  Strp (Glucose Blood) .... 4 Times Daily Blood Testing 20)  Prodigy Lancets 26g  Misc (Lancets) .... 4 Times Daily Blood Testing 21)  Ferrous Sulfate 325 (65 Fe) Mg Tabs (Ferrous Sulfate) .Marland Kitchen.. 1 Tab By Mouth Daily  Allergies (verified): 1)  ! Codeine  Physical Exam  Lungs:  Normal respiratory effort, chest expands symmetrically. Lungs are clear to auscultation, no crackles or wheezes. Heart:  Normal rate and  regular rhythm. S1 and S2 normal without gallop, murmur, click, rub or other extra sounds. Extremities:  Full ROM of right shoulder joint with minimal discomfort, but wearing what appears to be iontophoresis patches, I believe. Skin:  using wart remover on cystic lesion from PIP joint of right index finger--states it's sore now.  Mild inflammation and peeling of skin overlying cystic lesion , right index finger.   Impression & Recommendations:  Problem # 1:  ANEMIA (ICD-285.9) Stable, likely secondary to RA Her updated medication list for this problem includes:    Folic Acid 1 Mg Tabs (Folic acid) .Marland Kitchen... 1 tab by mouth daily    Ferrous Sulfate 325 (65 Fe) Mg Tabs (Ferrous sulfate) .Marland Kitchen... 1 tab by mouth daily  Problem # 2:  IDDM (ICD-250.01)  Fairly good control Her updated medication list for this problem includes:    Novolog Flexpen 100 Unit/ml Soln (Insulin aspart) ..... Sliding scale ac:  <100:  0 units;  101-150: 4 u ; 151-200: 6 u; 201-250:8 u; 251-300: 10 u; 301-350: 12 u;351-400:14 u; 401-450: 16 u.    Lantus For Opticlik 100 Unit/ml Soln (Insulin glargine) ..... Inject 45 units subcutaneously  every  morning    Lisinopril 5 Mg Tabs (Lisinopril) .Marland Kitchen... 1 tab by mouth daily  Orders: Hemoglobin A1C (830[address])  Problem # 3:  ARTHRITIS, RHEUMATOID (ICD-714.0) With cytic degeneration of right index PIP--stop wart removal liquid--discussed should only use on warts. We were assuming when she called regarding this that she actually had a wart. Her updated medication list for this problem includes:    Methotrexate 2.5 Mg Tabs (Methotrexate sodium) .Marland KitchenMarland KitchenMarland KitchenMarland Kitchen 6 tabs by mouth once weekly--take all tabs at same time, same day each week  Problem # 4:  GERD (ICD-530.81) Discussed proper dosing of Omeprazole Her updated medication list for this problem includes:    Omeprazole 40 Mg Cpdr (Omeprazole) .Marland Kitchen... 1 cap by mouth 1/2 hour before first meal of day.    Pepcid 20 Mg Tabs (Famotidine) .Marland Kitchen... Take 1  tablet by mouth every 12 hours for stomach  Complete Medication List: 1)  Omeprazole 40 Mg Cpdr (Omeprazole) .Marland Kitchen.. 1 cap by mouth 1/2 hour before first meal of day. 2)  Sertraline Hcl 100 Mg Tabs (Sertraline hcl) .Marland Kitchen.. 1 1/2 tabs by mouth daily 3)  Tylenol Extra Strength 500 Mg Tabs (Acetaminophen) .... 2 tabs po q 4-6 hrs as needed for fever,pain,headache. not to exceed 8 tabs in 24hrs. 4)  Novolog Flexpen 100 Unit/ml Soln (Insulin aspart) .... Sliding scale ac:  <100:  0 units;  101-150: 4 u ; 151-200: 6 u; 201-250:8 u; 251-300: 10 u; 301-350: 12  u;351-400:14 u; 401-450: 16 u. 5)  Lantus Pen Needles  .... Use with lantus pen  dx=250.3 severe risk hypoglycemia 6)  Lantus For Opticlik 100 Unit/ml Soln (Insulin glargine) .... Inject 45 units subcutaneously  every  morning 7)  Reglan 10 Mg Tabs (Metoclopramide hcl) .... Take one tablet prior to each meal and at bedtime 8)  Simvastatin 40 Mg Tabs (Simvastatin) .Marland Kitchen.. 1 tab by mouth daily 9)  Tramadol Hcl 50 Mg Tabs (Tramadol hcl) .... Take 1-2 tablets by mouth every 6 hours as needed for knee pain. can also be taken with tylenol. 10)  Pepcid 20 Mg Tabs (Famotidine) .... Take 1 tablet by mouth every 12 hours for stomach 11)  Lisinopril 5 Mg Tabs (Lisinopril) .Marland Kitchen.. 1 tab by mouth daily 12)  Plavix 75 Mg Tabs (Clopidogrel bisulfate) .... One tablet by mouth daily 13)  Metoprolol Tartrate 25 Mg Tabs (Metoprolol tartrate) .... 1/2 tablet by mouth two times a day 14)  Nitroglycerin 0.4 Mg/hr Pt24 (Nitroglycerin) .Marland Kitchen.. 1 tab sublingual as needed chest pain.  may repeat every 5 minutes x2 if not relieved. 15)  Methotrexate 2.5 Mg Tabs (Methotrexate sodium) .... 6 tabs by mouth once weekly--take all tabs at same time, same day each week 16)  Folic Acid 1 Mg Tabs (Folic acid) .Marland Kitchen.. 1 tab by mouth daily 17)  Promethazine Hcl 25 Mg Tabs (Promethazine hcl) .... Take every six hours as needed for nausea 18)  Prodigy Blood Glucose Monitor W/device Kit (Blood glucose  monitoring suppl) .... 4 times daily blood sugars 19)  Prodigy Blood Glucose Test Strp (Glucose blood) .... 4 times daily blood testing 20)  Prodigy Lancets 26g Misc (Lancets) .... 4 times daily blood testing 21)  Ferrous Sulfate 325 (65 Fe) Mg Tabs (Ferrous sulfate) .Marland Kitchen.. 1 tab by mouth daily  Other Orders: Orthopedic Surgeon Referral (Ortho Surgeon) T-Comprehensive Metabolic Panel [phone]) T-CBC w/Diff [phone]) Flu Vaccine 45yrs + [phone]) Admin 1st Vaccine (13086) Admin 1st Vaccine [facility]) [phone])  Patient Instructions: 1)  CPP with Dr. Delrae Alfred in 4 months 2)  lab visit for CBC, CMET in 6 weeks--V58.69 Prescriptions: TRAMADOL HCL 50 MG TABS (TRAMADOL HCL) Take 1-2 tablets by mouth every 6 hours as needed for knee pain. Can also be taken with tylenol.  #60 x 0   Entered and Authorized by:   Julieanne Manson MD   Signed by:   Julieanne Manson MD on 04/28/2010   Method used:   Print then Give to Patient   RxID:   [id number]   Laboratory Results   Blood Tests     HGBA1C: 7.2%   (Normal Range: Non-Diabetic - 3-6%   Control Diabetic - 6-8%) CBG Random:: 182mg /dL      Influenza Vaccine    Vaccine Type: Fluvax 3+    Site: right deltoid    Mfr: GlaxoSmithKline    Dose: 0.5 ml    Route: IM    Given by: Armenia Shannon    Exp. Date: 12/2010    Lot #: [id number]    VIS given: 02/22/10 version given April 28, 2010.  Flu Vaccine Consent Questions    Do you have a history of severe allergic reactions to this vaccine? no    Any prior history of allergic reactions to egg and/or gelatin? no    Do you have a sensitivity to the preservative Thimersol? no    Do you have a past history of Guillan-Barre Syndrome? no    Do you currently have an acute febrile illness? no  Have you ever had a severe reaction to latex? no    Vaccine information given and explained to patient? yes    Are you currently pregnant? no

## 2010-09-01 NOTE — Progress Notes (Signed)
Summary: Left shoulder pain  Phone Note Call from Patient   Summary of Call: please call patient is having problems with left shoulder call her at [phone] Initial call taken by: Domenic Polite,  August 08, 2010 10:27 AM  Follow-up for Phone Call        Left shoulder is now hurting -- ran out of tramadol.  Wants to know  if she needs to see you or if she could just get a refill of the tramadol.  Pain started on Friday, denies trauma, not sure if she slept wrong.  Saturday, she put a heating pad on it which relieved some of the pain, along with the tramadol, but she ran out of tramadol yesterday.   Some improvement with mobility and pain since Friday, is taking Tylenol Extra-Strength which is effective, but doesn't relieve the pain as long as the tramadol.     Advised to apply moist heat to area or take warm showers, take Tylenol Extra-Strength for pain and move her shoulder/arm in the shower or after application of heat.   If mobility and pain do not improve in a couple of days, to call back.   Follow-up by: Dutch Quint RN,  August 08, 2010 2:33 PM  Additional Follow-up for Phone Call Additional follow up Details #1::        I written the Tramadol, but she should use only if severe pain Additional Follow-up by: Julieanne Manson MD,  August 08, 2010 10:14 PM    Additional Follow-up for Phone Call Additional follow up Details #2::    Left message on answer machine for pt. to return call. Gaylyn Cheers RN  August 09, 2010 10:54 AM  Pt. contacted and is aware. Gaylyn Cheers RN  August 10, 2010 9:26 AM    Prescriptions: TRAMADOL HCL 50 MG TABS (TRAMADOL HCL) Take 1-2 tablets by mouth every 6 hours as needed for knee pain. Can also be taken with tylenol.  #60 x 0   Entered and Authorized by:   Julieanne Manson MD   Signed by:   Julieanne Manson MD on 08/08/2010   Method used:   Printed then faxed to ...       [facility] [address] [phone]* (retail)       [address]      [place], [place]  [address]       Ph: [phone]       Fax: [phone]   RxID:   [id number]

## 2010-09-01 NOTE — Miscellaneous (Signed)
Summary: Orders Update   Clinical Lists Changes  Orders: Added new Referral order of Physical Therapy Referral (PT) - Signed  Appended Document: Orders Update     Clinical Lists Changes  Orders: Added new Referral order of Physical Therapy Referral (PT) - Signed

## 2010-09-01 NOTE — Miscellaneous (Signed)
Summary: OUTPATIENT PROGRAM  OUTPATIENT PROGRAM   Imported By: Arta Bruce 03/28/2010 09:53:16  _____________________________________________________________________  External Attachment:    Type:   Image     Comment:   External Document

## 2010-09-01 NOTE — Progress Notes (Signed)
Summary: REfills Request  Medications Added NITROGLYCERIN 0.4 MG/HR PT24 (NITROGLYCERIN) 1 tab sublingual as needed chest pain.  May repeat every 5 minutes x2 if not relieved.       Phone Note Call from Patient Call back at [phone]   Summary of Call: [name] would like to get more refills from [facility].  It seem that her prescription had been expired.  Also she wants to know if the provider receivde the x-rays results just to make sure if she needs therapy. [facility] [address])  Delrae Alfred MD Initial call taken by: [name],  March 15, 2010 10:31 AM  Follow-up for Phone Call        [facility] was prescribed by cardiologist.  Sent to [facility]. Follow-up by: Dutch Quint RN,  March 15, 2010 10:42 AM  Additional Follow-up for Phone Call Additional follow up Details #1::        Will fill nitro Let her know the xray was okay. She should have been called by PT for her shoulder, and knee Additional Follow-up by: Julieanne Manson MD,  March 16, 2010 9:36 PM    Additional Follow-up for Phone Call Additional follow up Details #2::    Pt aware, but has medicaid and can not use our pharmacy.  Rx sent to [facility] [address] instead. Follow-up by: Vesta Mixer CMA,  March 17, 2010 2:53 PM  New/Updated Medications: NITROGLYCERIN 0.4 MG/HR PT24 (NITROGLYCERIN) 1 tab sublingual as needed chest pain.  May repeat every 5 minutes x2 if not relieved. Prescriptions: NITROGLYCERIN 0.4 MG/HR PT24 (NITROGLYCERIN) 1 tab sublingual as needed chest pain.  May repeat every 5 minutes x2 if not relieved.  #1 vial x 1   Entered by:   Vesta Mixer CMA   Authorized by:   Julieanne Manson MD   Signed by:   Vesta Mixer CMA on 03/17/2010   Method used:   Electronically to        [facility] [phone]* (retail)       [address]       [place], [place]  [address]       Ph: [phone]       Fax: [phone]   RxID:   [id number] NITROGLYCERIN 0.4 MG/HR PT24 (NITROGLYCERIN) 1 tab  sublingual as needed chest pain.  May repeat every 5 minutes x2 if not relieved.  #1 vial x 1   Entered and Authorized by:   Julieanne Manson MD   Signed by:   Julieanne Manson MD on 03/16/2010   Method used:   Faxed to ...       [facility] (retail)       [address] [place], [place]  [address]       Ph: [phone]       Fax: [phone]   RxID:   [id number]

## 2010-09-01 NOTE — Progress Notes (Signed)
Summary: NEEDS REFILLS   Phone Note Call from Patient Call back at Home Phone [phone]   Reason for Call: Refill Medication Summary of Call: [name] [name] PT. [name] CALLED FOR A REFILL ON HER TRAMADOL AND HER NOVOLOG.  SHE HAS MEDICAID NOW AND THE  NEED TO CALLED INTO [facility] [address]. Initial call taken by: Leodis Rains,  March 31, 2010 2:13 PM  Follow-up for Phone Call        Fwd to PCP for approval Michelle Nasuti  March 31, 2010 5:02 PM   Additional Follow-up for Phone Call Additional follow up Details #1::        Did she call the pharmacy first--she should have refills on the Novolog at [facility] for several more months.  Tramadol to be faxed Additional Follow-up by: Julieanne Manson MD,  March 31, 2010 6:20 PM    Additional Follow-up for Phone Call Additional follow up Details #2::    Called pt. as did not see a response to above note--she did call pharmacy and got her Novolog filled. Follow-up by: Julieanne Manson MD,  April 01, 2010 5:50 PM  Prescriptions: TRAMADOL HCL 50 MG TABS (TRAMADOL HCL) Take 1-2 tablets by mouth every 6 hours as needed for knee pain. Can also be taken with tylenol.  #60 x 0   Entered and Authorized by:   Julieanne Manson MD   Signed by:   Julieanne Manson MD on 03/31/2010   Method used:   Printed then faxed to ...       [facility] [address] [phone]* (retail)       [address]       [place], [place]  [address]       Ph: [phone]       Fax: [phone]   RxID:   [id number]

## 2010-09-01 NOTE — Letter (Signed)
Summary: NUTRITION & DIABETES  NUTRITION & DIABETES   Imported By: Arta Bruce 10/19/2009 15:27:34  _____________________________________________________________________  External Attachment:    Type:   Image     Comment:   External Document

## 2010-09-01 NOTE — Progress Notes (Signed)
Summary: refill for Methotrexate  Phone Note Call from Patient Call back at Home Phone [phone]   Reason for Call: Refill Medication Summary of Call: Ramani Riva PT. Yanai CALLED FOR REFILLS ON HER METHOTREXATE TO BE SENT TO [facility]. [facility] SAYS TO HER THAT THEY NEED approval. Initial call taken by: Leodis Rains,  May 09, 2010 3:45 PM  Follow-up for Phone Call        FWD to provider Follow-up by: Michelle Nasuti,  May 09, 2010 4:11 PM  Additional Follow-up for Phone Call Additional follow up Details #1::        ms Davidoff called to see if she can get her rx today. Additional Follow-up by: Leodis Rains,  May 10, 2010 4:59 PM    Additional Follow-up for Phone Call Additional follow up Details #2::    Is she not getting this filled through Rheumatology?  We have discussed that they should be doing this--Only one office should be filling this to keep track of how she is taking.  Julieanne Manson MD  May 11, 2010 7:55 AM   Ms. Spieker called me on my personal cell phone as she states she called in today and was told that I was not in.  She has no knowledge as to who has been writing her Rx for Methotrexate.  I called [facility]--has been written by Dr. Darrol Angel.  She is to get her labs every 6 weeks, which we have been trying to get Ms. Poudrier in for--this has to be reiterated every time she comes to the office.   Ms. Depaolis also was not aware of her next appt. at [facility] a call to the Internal Medicine OPD at [facility], found out her next visit is Nov. 14th at 9:30 a.m.  ABove info regarding labs also confirmed.   Please call Ms. Wakeman and give her her appt. date for next Rheum appt., let her know I have spoken with Rheum office and hopefully, they will call in her Methotrexate today. She needs to come in today and get the BMET I requested after hydrating significantly--notified at the end of Sept.  We left a message at her home and she has not yet come in for that.  She needs  a lab appt. for CMEt and CBC 6 weeks after the last lab draw (which was 9/29) Will fax last labs to Rheum clinic. Follow-up by: Julieanne Manson MD,  May 11, 2010 2:37 PM  Additional Follow-up for Phone Call Additional follow up Details #3:: Details for Additional Follow-up Action Taken: pt aware of above information. pt states she was aware of [facility] appt. pt cannot come in today, as she does not have a ride. She has another appt 05/12/10 so cannot come. pt will speak with sister to see when she would be able to bring pt back to office for labs. will call back with this info  INteresting as she had no idea when I spoke with her  Julieanne Manson MD  May 12, 2010 6:53 PM  Additional Follow-up by: Michelle Nasuti,  May 11, 2010 2:56 PM

## 2010-09-01 NOTE — Progress Notes (Signed)
Summary: refills request   Phone Note Call from Patient   Summary of Call: The pt needs more refills from tramadol medication ([facility] [address]).  Please let her know when is ready. Chayla Shands MD Initial call taken by: [name],  January 25, 2010 8:25 AM  Follow-up for Phone Call        Last got #60/2 on 11/05/09  Follow-up by: Vesta Mixer CMA,  January 25, 2010 10:58 AM  Additional Follow-up for Phone Call Additional follow up Details #1::        Where is she having pain now?  Joints again? Additional Follow-up by: Julieanne Manson MD,  January 28, 2010 6:03 PM    Prescriptions: TRAMADOL HCL 50 MG TABS (TRAMADOL HCL) Take 1-2 tablets by mouth every 6 hours as needed for knee pain. Can also be taken with tylenol.  #60 x 0   Entered and Authorized by:   Julieanne Manson MD   Signed by:   Julieanne Manson MD on 01/28/2010   Method used:   Printed then faxed to ...       [facility] [address] [phone]* (retail)       [address]       [place], [place]  [address]       Ph: [phone]       Fax: [phone]   RxID:   [id number]

## 2010-09-01 NOTE — Progress Notes (Signed)
   Phone Note Outgoing Call   Summary of Call: Tiffany--did Ms.  Sando have an A1C done at her visit on 4/29?  If so, could you append the note with that result.  Also, please make sure she has an appt. coming up in next 2 weeks for labs--please add a BP recheck with that and make sure she is taking bp meds as states in chart--she was a bit low last visit Initial call taken by: Julieanne Manson MD,  Dec 20, 2009 9:52 AM  Follow-up for Phone Call        It does appear that she had one done, but no results were entered.  Will check at her lab visit on 12/28/09. Follow-up by: Vesta Mixer CMA,  Dec 20, 2009 11:12 AM

## 2010-09-01 NOTE — Miscellaneous (Signed)
Summary: INITIAL SUMMARY  INITIAL SUMMARY   Imported By: Arta Bruce 02/02/2010 10:19:20  _____________________________________________________________________  External Attachment:    Type:   Image     Comment:   External Document

## 2010-09-01 NOTE — Progress Notes (Signed)
Summary: NOSE BLEED  Phone Note Call from Patient Call back at Home Phone [phone]   Reason for Call: Talk to Nurse Summary of Call: MULBERRY PT. MS Buhrman CALLED AND SAYS THAT EVERY TIME SHE SNEEZES HER NOSE BLEEDS AND SHE SAYS IT HAS A FLOW TO IT. IT STARTED YESTERDAY, AND SHE WANTS TO KNOW IF SHE NEEDS TO GO TO THE ER Initial call taken by: Leodis Rains,  May 02, 2010 9:28 AM  Follow-up for Phone Call        When she sneezes, "more than a sneezeful" of blood.  Uses 3-4 kleenex, one is filled with blood. Is blowing her nose after each sneeze.   Blood is dark red, no other discharge noted.  Started yesterday, bleeding is sometimes one-sided, other is both.  Has not tried direct pressure.  Currently still bleeding.  Used nosebleed protocol -- instructed  to apply direct pressure to both nares for 10-15 minutes, releasting nares gradually and to avoid blowing her nose.   Advised to try for several attempts.  If this does not work, to call office back. Follow-up by: Dutch Quint RN,  May 02, 2010 1:07 PM  Additional Follow-up for Phone Call Additional follow up Details #1::        Please call back and see if she is doing better.  Julieanne Manson MD  May 09, 2010 9:31 AM    [place] please call as above.  If no better, let me talk to her. Tereso Newcomer PA-C  May 09, 2010 1:42 PM     Additional Follow-up for Phone Call Additional follow up Details #2::    pt was not at home.... [place] Shannon  May 09, 2010 3:16 PM  pt is feeling alot better.... [place] Shannon  May 09, 2010 3:59 PM

## 2010-09-01 NOTE — Progress Notes (Signed)
Summary: REFILLS Tramadol  Phone Note Refill Request Call back at [phone]   Refills Requested: Medication #1:  TRAMADOL HCL 50 MG TABS Take 1-2 tablets by mouth every 6 hours as needed for knee pain. Can also be taken with tylenol. PT IS CALLING BECAUSE SHE WENT TO [facility] ADN THE RX IS NOT THERE , [facility] [address]  Initial call taken by: Domenic Polite,  July 08, 2010 2:50 PM  Follow-up for Phone Call        MS  Carbajal CALLING AGAIN TO SEE IF HER TRAMADOL WILL BE FILLED Follow-up by: Leodis Rains,  July 08, 2010 3:41 PM    Prescriptions: TRAMADOL HCL 50 MG TABS (TRAMADOL HCL) Take 1-2 tablets by mouth every 6 hours as needed for knee pain. Can also be taken with tylenol.  #60 x 0   Entered by:   Gaylyn Cheers RN   Authorized by:   Julieanne Manson MD   Signed by:   Gaylyn Cheers RN on 07/08/2010   Method used:   Printed then faxed to ...       [facility] [address] [phone]* (retail)       [address]       [place], [place]  [address]       Ph: [phone]       Fax: [phone]   RxID:   [id number]  Pt. notified. Gaylyn Cheers RN  July 08, 2010 4:04 PM

## 2010-09-01 NOTE — Progress Notes (Signed)
Summary: Office Visit//DEPRESSION SCREENING  Office Visit//DEPRESSION SCREENING   Imported By: Arta Bruce 08/26/2010 15:43:20  _____________________________________________________________________  External Attachment:    Type:   Image     Comment:   External Document

## 2010-09-01 NOTE — Letter (Signed)
Summary: REFERRAL//PYSICAL THERAPY  REFERRAL//PYSICAL THERAPY   Imported By: Arta Bruce 01/18/2010 15:52:10  _____________________________________________________________________  External Attachment:    Type:   Image     Comment:   External Document

## 2010-09-01 NOTE — Assessment & Plan Note (Signed)
Summary: [facility]/LABS//KT   Vital Signs:  Patient profile:   45 year old female Menstrual status:  regular Weight:      171.5 pounds O2 Sat:      98 % Temp:     97.1 degrees F oral Pulse rate:   42 / minute Pulse rhythm:   irregular BP sitting:   80 / 60  (left arm) Cuff size:   regular  Vitals Entered By: Gaylyn Cheers RN (June 07, 2010 10:44 AM)   Serial Vital Signs/Assessments:  Time      Position  BP       Pulse  Resp  Temp     By                     98/64    87                    Melinda Madtes RN                                PEF    PreRx  PostRx Time      O2 Sat  O2 Type     L/min  L/min  L/min   By           98  %                                     Gaylyn Cheers RN  Comments: 110/68 78 12:40 By: Gaylyn Cheers RN   CC: diaphoretic pulse weak, feeling lightheaded, Dr. Delrae Alfred called to rm O2 @ 2L per n/c; repeat CBG 137 @ 11:30 Asa 325mg  given chewable, 12:00 CBG 173; Ate 4 crackers and some cheese. 12:40 CBG 223 Pain Assessment Patient in pain? yes     Location: abdomen Intensity: 6 Type: aching CBG Result 123 CBG Device ID A   Primary Care Provider:  [facility]  CC:  diaphoretic pulse weak, feeling lightheaded, Dr. Delrae Alfred called to rm O2 @ 2L per n/c; repeat CBG 137 @ 11:30 Asa 325mg  given chewable, and 12:00 CBG 173; Ate 4 crackers and some cheese. 12:40 CBG 223.  History of Present Illness: Called into room for diabetic pt. with above concerns.  Pt. states her sugar was 60 before coming in this morning and when she became symptomatic with diaphoesis, shakiness, light headedness.  She did eat, I believe, a sausage biscuit subsequently, just before coming in, but continues with above symptoms.  Pt. states her symptoms are very similar to what happens when her sugar drops too low.  Had a fleeting episode of sharp chest discomfort when she smoked a cigarette after symptoms started.  Does feel a bit dyspneic, which is often a  symptom when her sugar drops as well.    Pt. has also had a tender, red swelling in her left groin for a few days.  No fever in past 3 days.  Did have a fever of 101, 4 days ago with onset of some upper airway congestion--symptoms have improved over the weekend.  Sugars prior to this morning have been fine.  Mild nausea with onset of above episode, but has not vomited.  No diarrhea or abdominal pain.  SaO2 is 97-98 % on RA.  Allergies: 1)  ! Codeine  Physical Exam  General:  Pt.  is a bit tachypneic--SaO2 97% on RA as noted.  Clothing is soaked with perspiration both front and back.  Skin is cool and clammy.  Pt. is alert and speaking in full sentences. Eyes:  No corneal or conjunctival inflammation noted. EOMI. Perrla. Funduscopic exam benign, without hemorrhages, exudates or papilledema. Vision grossly normal. Mouth:  pharynx pink and moist.   Neck:  No deformities, masses, or tenderness noted. Lungs:  Normal respiratory effort, chest expands symmetrically. Lungs are clear to auscultation, no crackles or wheezes. Heart:  Normal rate and regular rhythm. S1 and S2 normal without gallop, murmur, click, rub or other extra sounds.  Radial pulses normal and equal. Abdomen:  Bowel sounds positive,abdomen soft and non-tender without masses, organomegaly or hernias noted. Neurologic:  alert & oriented X3, cranial nerves II-XII intact, and strength normal in all extremities.   Skin:  Left groin with 2 cm by 1 cm fluctuant cystic lesion with 2 mm opening and surrounding erythema--tender to palpation.  Cleaned with betadine, 2cc of 1% lidocaine injected to overlying skin.  11 blade used to I and D.  Possibly 2 cc of purulent drainage mixed with scant amt of cheesy yellow discharge expressed.  No packing of wound as fairly superficial.  Dressing applied.   Impression & Recommendations:  Problem # 1:  HYPOGLYCEMIA (ICD-251.2) Pt. kept in office for a couple of hours with resolution of symptoms as her  sugar maintained at a better level. She did take her Lantus this morning, but no Novolog. Her husband brought her lunch, which she ate well. Given 5 units of Novolin insulin as sugar up at time of lunch Her blood pressure, breathing returned to normal range at discharge and dyspnea resolved. EKG showed old inferior Q waves with unchanged flipped T waves in same leads--compared to EKG in April 2011 in hospital.   She was at baseline when leaving office.  Problem # 2:  CHEST PAIN (ICD-786.50) Fleeting --EKG was unchanged as above  Problem # 3:  SEBACEOUS CYST, INFECTED (ICD-706.2) Given Ceftriaxone 1 g IM after I and D To start Cefixime tomorrow morning.  Orders: T-Culture, Wound (87070/[id number]) T- * Misc. Laboratory test [id number]) I&D Abscess, Simple / Single (10060)  Complete Medication List: 1)  Omeprazole 40 Mg Cpdr (Omeprazole) .Marland Kitchen.. 1 cap by mouth 1/2 hour before first meal of day. 2)  Sertraline Hcl 100 Mg Tabs (Sertraline hcl) .Marland Kitchen.. 1 1/2 tabs by mouth daily 3)  Tylenol Extra Strength 500 Mg Tabs (Acetaminophen) .... 2 tabs po q 4-6 hrs as needed for fever,pain,headache. not to exceed 8 tabs in 24hrs. 4)  Novolog Flexpen 100 Unit/ml Soln (Insulin aspart) .... Sliding scale ac:  <100:  0 units;  101-150: 4 u ; 151-200: 6 u; 201-250:8 u; 251-300: 10 u; 301-350: 12 u;351-400:14 u; 401-450: 16 u. 5)  Lantus Pen Needles  .... Use with lantus pen  dx=250.3 severe risk hypoglycemia 6)  Lantus For Opticlik 100 Unit/ml Soln (Insulin glargine) .... Inject 45 units subcutaneously  every  morning 7)  Reglan 10 Mg Tabs (Metoclopramide hcl) .... Take one tablet prior to each meal and at bedtime 8)  Simvastatin 40 Mg Tabs (Simvastatin) .Marland Kitchen.. 1 tab by mouth daily 9)  Tramadol Hcl 50 Mg Tabs (Tramadol hcl) .... Take 1-2 tablets by mouth every 6 hours as needed for knee pain. can also be taken with tylenol. 10)  Pepcid 20 Mg Tabs (Famotidine) .... Take 1 tablet by mouth every 12 hours for  stomach 11)  Lisinopril  5 Mg Tabs (Lisinopril) .Marland Kitchen.. 1 tab by mouth daily 12)  Plavix 75 Mg Tabs (Clopidogrel bisulfate) .... One tablet by mouth daily 13)  Metoprolol Tartrate 25 Mg Tabs (Metoprolol tartrate) .... 1/2 tablet by mouth two times a day 14)  Nitroglycerin 0.4 Mg/hr Pt24 (Nitroglycerin) .Marland Kitchen.. 1 tab sublingual as needed chest pain.  may repeat every 5 minutes x2 if not relieved. 15)  Methotrexate 2.5 Mg Tabs (Methotrexate sodium) .... 6 tabs by mouth once weekly--take all tabs at same time, same day each week 16)  Folic Acid 1 Mg Tabs (Folic acid) .Marland Kitchen.. 1 tab by mouth daily 17)  Promethazine Hcl 25 Mg Tabs (Promethazine hcl) .... Take every six hours as needed for nausea 18)  Prodigy Blood Glucose Monitor W/device Kit (Blood glucose monitoring suppl) .... 4 times daily blood sugars 19)  Prodigy Blood Glucose Test Strp (Glucose blood) .... 4 times daily blood testing 20)  Prodigy Lancets 26g Misc (Lancets) .... 4 times daily blood testing 21)  Ferrous Sulfate 325 (65 Fe) Mg Tabs (Ferrous sulfate) .Marland Kitchen.. 1 tab by mouth daily 22)  Suprax 400 Mg Tabs (Cefixime) .Marland Kitchen.. 1 tab by mouth daily for 7 days  Other Orders: Capillary Blood Glucose/CBG (47829) Capillary Blood Glucose/CBG (56213) Admin of Therapeutic Inj  intramuscular or subcutaneous (08657) Insulin 5 units (Q4696) ASA 325mg  tab [facility]) Rocephin  250mg  (E9528) EKG w/ Interpretation (93000)  Patient Instructions: 1)  Follow up with Dr. Delrae Alfred on Thursday this week--hypoglycemia and infected epidermoid cyst. 2)  Start oral antibiotic tomorrow--cefixime 3)  Soak in warm bath after removing dressing two times a day for 20 minutes.  Pat area dry and place dressing each time. Prescriptions: SUPRAX 400 MG TABS (CEFIXIME) 1 tab by mouth daily for 7 days  #7 x 0   Entered and Authorized by:   Julieanne Manson MD   Signed by:   Julieanne Manson MD on 06/07/2010   Method used:   Electronically to        [facility]  [phone]* (retail)       [address]       [place], [place]  [address]       Ph: [phone]       Fax: [phone]   RxID:   [id number]    Medication Administration  Injection # 1:    Medication: Insulin 5 units    Diagnosis: IDDM (ICD-250.01)    Route: SQ    Site: L deltoid    Exp Date: 10/2010    Lot #: [id number]    Mfr: NovaNordisk    Comments: 4 units given    Patient tolerated injection without complications    Given by: Gaylyn Cheers RN (June 07, 2010 12:22 PM)  Injection # 2:    Medication: Rocephin  250mg     Diagnosis: IDDM (ICD-250.01)    Route: IM    Site: R deltoid    Exp Date: 12/2010    Lot #: [id number]    Mfr: hospira  Medication # 1:    Medication: ASA 325mg  tab    Diagnosis: IDDM (ICD-250.01)    Dose: 1 tablet    Route: po    Patient tolerated medication without complications    Given by: Gaylyn Cheers RN (June 07, 2010 12:30 PM)  Orders Added: 1)  Capillary Blood Glucose/CBG [82948] 2)  Capillary Blood Glucose/CBG [82948] 3)  Admin of Therapeutic Inj  intramuscular or subcutaneous [96372] 4)  Insulin 5 units [J1815] 5)  ASA  325mg  tab [EMRORAL] 6)  Rocephin  250mg  [J0696] 7)  EKG w/ Interpretation [93000] 8)  T-Culture, Wound [87070/[id number]] 9)  T- * Misc. Laboratory test [99999] 10)  Est. Patient Level IV [81191] 11)  I&D Abscess, Simple / Single [10060]     Medication Administration  Injection # 1:    Medication: Insulin 5 units    Diagnosis: IDDM (ICD-250.01)    Route: SQ    Site: L deltoid    Exp Date: 10/2010    Lot #: [id number]    Mfr: NovaNordisk    Comments: 4 units given    Patient tolerated injection without complications    Given by: Gaylyn Cheers RN (June 07, 2010 12:22 PM)  Injection # 2:    Medication: Rocephin  250mg     Diagnosis: IDDM (ICD-250.01)    Route: IM    Site: R deltoid    Exp Date: 12/2010    Lot #: [id number]    Mfr: hospira  Medication # 1:    Medication: ASA 325mg  tab    Diagnosis:  IDDM (ICD-250.01)    Dose: 1 tablet    Route: po    Patient tolerated medication without complications    Given by: Gaylyn Cheers RN (June 07, 2010 12:30 PM)  Orders Added: 1)  Capillary Blood Glucose/CBG [82948] 2)  Capillary Blood Glucose/CBG [82948] 3)  Admin of Therapeutic Inj  intramuscular or subcutaneous [96372] 4)  Insulin 5 units [J1815] 5)  ASA 325mg  tab [EMRORAL] 6)  Rocephin  250mg  [J0696] 7)  EKG w/ Interpretation [93000] 8)  T-Culture, Wound [87070/[id number]] 9)  T- * Misc. Laboratory test [99999] 10)  Est. Patient Level IV [78469] 11)  I&D Abscess, Simple / Single [10060]

## 2010-09-01 NOTE — Letter (Signed)
Summary: External Other  External Other   Imported By: Arta Bruce 09/07/2009 14:11:08  _____________________________________________________________________  External Attachment:    Type:   Image     Comment:   External Document

## 2010-09-01 NOTE — Progress Notes (Signed)
  Phone Note Call from Patient Call back at Home Phone [phone]   Caller: Patient Call For: Julieanne Manson MD Reason for Call: Talk to Doctor Summary of Call: Patients wants to know if she can use OTC wart removal on her hand and if there are any possible contraindications for diabtetics. Initial call taken by: Janace Aris,  April 15, 2010 12:04 PM  Follow-up for Phone Call        She can use salicylic acid patches--just needs to make sure she has good skin care and to be seen if any signs of infection. Follow-up by: Julieanne Manson MD,  April 18, 2010 8:24 AM  Additional Follow-up for Phone Call Additional follow up Details #1::        pt aware Additional Follow-up by: Michelle Nasuti,  April 18, 2010 11:34 AM

## 2010-09-01 NOTE — Letter (Signed)
Summary: MED/SOLUTIONS//DENIED  MED/SOLUTIONS//DENIED   Imported By: Arta Bruce 01/18/2010 11:02:39  _____________________________________________________________________  External Attachment:    Type:   Image     Comment:   External Document

## 2010-09-01 NOTE — Progress Notes (Signed)
Summary: Medical question   Phone Note Call from Patient   Summary of Call: Since last night, the pt has swelling knee and shoulder, so  she took prednizone and the pt is wondered if that is okay.  Please call her back Mayer Vondrak MD Initial call taken by: [name],  February 18, 2010 8:19 AM  Follow-up for Phone Call        Spoke with pt she states her knee started swelling yesterday eveing, so she took a prednisone 10mg  and she stated the swelling is still there, but it is not as painful today.  Also, her shoulder is hurting from using the cane.  She is still taking her tramdol.  She said PT is supposed to be calling to see if she can have her shoulder worked on also. Follow-up by: Vesta Mixer CMA,  February 18, 2010 9:30 AM  Additional Follow-up for Phone Call Additional follow up Details #1::        Has she been to her Rheumatology appt. yet?    Additional Follow-up for Phone Call Additional follow up Details #2::    Pt went last week.  She is here for f/u. Follow-up by: Vesta Mixer CMA,  February 25, 2010 10:00 AM

## 2010-09-01 NOTE — Letter (Signed)
Summary: [facility] [facility]PPLIES//FAXED  [facility] [facility] [facility]PPL[facility]//FAXED   Imported By: Arta Bruce 08/12/2010 10:36:00  _____________________________________________________________________  External Attachment:    Type:   Image     Comment:   External Document

## 2010-09-01 NOTE — Assessment & Plan Note (Signed)
Summary: FOOT SWOLLEN////KT   Vital Signs:  Patient profile:   45 year old female Menstrual status:  regular Weight:      197.8 pounds Temp:     97.7 degrees F oral Pulse rate:   93 / minute Pulse rhythm:   regular Resp:     18 per minute BP sitting:   116 / 78  (left arm) Cuff size:   regular  Vitals Entered By: Geanie Cooley  (August 17, 2009 4:38 PM) CC:  Pt here for swollen right foot its not swollen today. Pt states she stopped taking the Prednisone on 08/09/2009 because she couldnt deal with the mood swings and to see if it was the cause of her weight gain.  Pt also hasnt takin the Vastiril in a week because theres no refills on it and she need a new prescription for it. Pt states she has had a little sore throat for a week now. Pt states her feet are a little numb Is Patient Diabetic? Yes Did you bring your meter with you today? No Pain Assessment Patient in pain? no      CBG Result 97  Does patient need assistance? Functional Status Self care Ambulation Normal   Primary Care Provider:  [facility]  CC:   Pt here for swollen right foot its not swollen today. Pt states she stopped taking the Prednisone on 08/09/2009 because she couldnt deal with the mood swings and to see if it was the cause of her weight gain.  Pt also hasnt takin the Vastiril in a week because theres no refills on it and she need a new prescription for it. Pt states she has had a little sore throat for a week now. Pt states her feet are a little numb.  History of Present Illness: 1.  Mood problems since on prednisone--was considering killing husband and had a plan, so stopped prednisone.  Was down to 10 mg daily, but the last 3 days she was taking, she accidentally took 20 mg of the prednisone.  She finally threw the prednisone away on the 10th of January--about 1 week ago.  Discussed difficulty with adrenal insufficiency, including death, but pt. refuses to consider lower dose  prednisone taper at this  point.  2.  Right foot swelling:  Had not stopped taking the prednisone at the time.  Toes at MTP joints and dorsum of proximal foot extending medially over ankle.  Pt. states she twisted ankle 2 days earlier.  Has not had pain or swelling similar to that with polyarthritis suffered prior to starting prednisone.  Foot and ankle fine now.  Has some Tramadol and Tylenol Pain Arthritis she can use for joint pain ( in reference to polyarthritis/RA)  Discussed concerns if uses Tramadol regularly while on Zoloft.  3.  DM:  Sugars still up and down--into 500s.  Continues to skip breakfast and morning Novolog, sometimes Lantus as well.  Often gets up in morning, takes Lantus and goes back to sleep for an hour.  Often bottoms out with sugar before lunch.  Sugars in general, however, Belardo--250 up to 500s at times.  No significant change since stopping Prednisone last week.  Have discussed with pt. on multiple occasions that she cannot skip meals and not expect to chase sugars--ends up taking 4-6 glucose tabs for low sugars and then sugars very [place] later in day.  Still has not gone back for more Diabetic education other than here in our office.  Allergies (verified): 1)  !  Codeine  Physical Exam  General:  Obese, NAD, happy Lungs:  Normal respiratory effort, chest expands symmetrically. Lungs are clear to auscultation, no crackles or wheezes. Heart:  Normal rate and regular rhythm. S1 and S2 normal without gallop, murmur, click, rub or other extra sounds.  Radial pulses normal and equal. Abdomen:  soft, non-tender, and normal bowel sounds.   Extremities:  Mild edema bilaterally.  No focal swelling of right foot or ankle   Impression & Recommendations:  Problem # 1:  ARTHRITIS, RHEUMATOID (ICD-714.0)  Pt. to most likely get Medicaid in next 30-60 days.   She is to call and we will get her in with Rheumatology locally. Need to see who takes Medicaid The following medications were removed from the  medication list:    Prednisone 10 Mg Tabs (Prednisone) .Marland Kitchen... 1 1/2  tab by mouth daily  Orders: Rheumatology Referral (Rheumatology)  Problem # 2:  IDDM (ICD-250.01)  Unable to get beyond noncompliance with lifestyle to improve DM Send again to Diabetic Education--perhaps repetition will help. Her updated medication list for this problem includes:    Novolog Flexpen 100 Unit/ml Soln (Insulin aspart) ..... Sliding scale before each meal:  <100:  0 units;  101-150: 4 u ; 151-200: 6 u; 201-250:8 u; 251-300: 10 u; 301-350: 12 u;351-400:14 u; 401-450: 16 u.    Lantus For Opticlik 100 Unit/ml Soln (Insulin glargine) ..... Inject 50 units subcutaneously  every  morning    Lisinopril 5 Mg Tabs (Lisinopril) .Marland Kitchen... 1/2 tab by mouth daily--decrease by dr. Eldridge Dace.  Orders: Nutrition Referral (Nutrition)  Complete Medication List: 1)  Protonix 40 Mg Solr (Pantoprazole sodium) .Marland Kitchen.. 1 by mouth once daily 2)  Zoloft 100 Mg Tabs (Sertraline hcl) .Marland Kitchen.. 1 by mouth qday 3)  Tylenol Extra Strength 500 Mg Tabs (Acetaminophen) .... 2 tabs po q 4-6 hrs as needed for fever,pain,headache. not to exceed 8 tabs in 24hrs. 4)  Novolog Flexpen 100 Unit/ml Soln (Insulin aspart) .... Sliding scale before each meal:  <100:  0 units;  101-150: 4 u ; 151-200: 6 u; 201-250:8 u; 251-300: 10 u; 301-350: 12 u;351-400:14 u; 401-450: 16 u. 5)  Lantus Pen Needles  .... Use with lantus pen  dx=250.3 severe risk hypoglycemia 6)  Lantus For Opticlik 100 Unit/ml Soln (Insulin glargine) .... Inject 50 units subcutaneously  every  morning 7)  Vistaril 25 Mg Caps (Hydroxyzine pamoate) .... Take 1 capsule by mouth every 8 hours as needed for anxiety 8)  Reglan 10 Mg Tabs (Metoclopramide hcl) .... Take one tablet prior to each meal and at bedtime 9)  Crestor 5 Mg Tabs (Rosuvastatin calcium) .... Take 1 tab by mouth daily 10)  Tramadol Hcl 50 Mg Tabs (Tramadol hcl) .... Take 1-2 tablets by mouth every 6 hours as needed for knee pain. can  also be taken with tylenol. 11)  Pepcid 20 Mg Tabs (Famotidine) .... Take 1 tablet by mouth every 12 hours for stomach 12)  Lisinopril 5 Mg Tabs (Lisinopril) .... 1/2 tab by mouth daily--decrease by dr. Eldridge Dace. 13)  Plavix 75 Mg Tabs (Clopidogrel bisulfate) .... One tablet by mouth daily 14)  Metoprolol Tartrate 25 Mg Tabs (Metoprolol tartrate) .... 1/2 tablet by mouth two times a day 15)  Nitroglycerin 0.4 Mg/hr Pt24 (Nitroglycerin) .... Use as directed  Patient Instructions: 1)  Follow up with Dr. Delrae Alfred in 2 weeks--DM and RA 2)  To call if feeling poorly, fatigue, dizziness.   Vital Signs:  Patient profile:   45 year  old female Menstrual status:  regular Weight:      197.8 pounds Temp:     97.7 degrees F oral Pulse rate:   93 / minute Pulse rhythm:   regular Resp:     18 per minute BP sitting:   116 / 78  (left arm) Cuff size:   regular  Vitals Entered By: Geanie Cooley  (August 17, 2009 4:38 PM)     Last LDL:                                                 79 (11/19/2008 9:53:00 PM)      Diabetic Foot Exam    10-g (5.07) Semmes-Weinstein Monofilament Test           Right Foot          Left Foot Visual Inspection     normal            Test Control      abnormal         abnormal Site 1                    abnormal Site 2                    normal Site 3                    normal Site 4         abnormal         abnormal Site 5         abnormal         abnormal Site 6         abnormal         abnormal Site 7         normal         abnormal Site 8         abnormal         abnormal Site 9         abnormal         abnormal Site 10         normal         normal    Laboratory Results   Blood Tests   Date/Time Received: August 17, 2009 5:03 PM   HGBA1C: 8.7%   (Normal Range: Non-Diabetic - 3-6%   Control Diabetic - 6-8%) CBG Random:: 97mg /dL       Tetanus/Td Immunization History:    Tetanus/Td # 1:  Td (06/02/2004)  Influenza Immunization  History:    Influenza # 1:  Fluvax 3+ (05/04/2009)  Pneumovax Immunization History:    Pneumovax # 1:  Pneumovax (10/31/2005)  Other Immunization History:    H1N1 # 1 (07/21/2008)

## 2010-09-01 NOTE — Assessment & Plan Note (Signed)
Summary: follow up with Dr Delrae Alfred in 2 wks arthritis//gk   Vital Signs:  Patient profile:   45 year old female Menstrual status:  regular Weight:      189.4 pounds Temp:     98.0 degrees F Pulse rate:   105 / minute Pulse rhythm:   regular Resp:     20 per minute BP sitting:   105 / 72  (left arm) Cuff size:   regular  Vitals Entered By: Vesta Mixer CMA (September 24, 2009 11:53 AM) CC: 2 week f/u arthritis and went to ED Monday and dx with pneumonia Is Patient Diabetic? Yes Pain Assessment Patient in pain? yes     Location: hands Intensity: 10 CBG Result 108  Does patient need assistance? Ambulation Normal   Primary Care Provider:  [facility]  CC:  2 week f/u arthritis and went to ED Monday and dx with pneumonia.  History of Present Illness: 1.  Bilateral lower back pain--left worse than right:  Started almost 1 week ago.  Tramadol did not help.  CT of chest and abdomen  and pelvis showed new lower lobe infiltrates in bilateral lungs--was given 1 g of Zithromax. Did not have any symptoms of cough, fever.  Did have a bit of dyspnea at the time.   That is now better.  Low back is definitely better, but still with pain.  Has not started back on Tramadol as was placed on Valium for muscle relaxant.  Pt. was concerned as she has a history of drug abuse.  2. RA:  Pt. took one dose of Prednisone 10 mg after last visit and started the Tramadol ans awakened the next morning with no more chest or shoulder pain.  Has not used prednisone since and basically was discussed with Cardiology, Dr. Eldridge Dace  who did not really want her to be on it.  Allergies (verified): 1)  ! Codeine  Physical Exam  Lungs:  Normal respiratory effort, chest expands symmetrically. Lungs are clear to auscultation, no crackles or wheezes. Heart:  Normal rate and regular rhythm. S1 and S2 normal without gallop, murmur, click, rub or other extra sounds. Msk:  Mild lumbar paraspinous muscular tenderness. Moves  left shoulder easily today   Impression & Recommendations:  Problem # 1:  ARTHRITIS, RHEUMATOID (ICD-714.0) Spoke with Dr. Darnelle Catalan, Rheumatology at [facility] Explained difficulties getting pt. into see Rheum at [facility] and waiting on Medicaid Recommends the following to confirm RA:  Repeat RF, anti cyclic citrullinated peptide Ab, Xrays of hands and feet. Also need to rule out elevations of liver transaminases, fatty liver, chronic hepatitis as will be hard to follow pt. on many meds used if a problem and chronic hepatitis also with poly arthritis--this pt. is Poullard risk for Hep C and B with her hx of polydrug abuse.  HIV negative in past, but not checked since 2009 If labs support RA and no contraindication from a liver standpoint, start Methotrexate after getting baseline CXR. Discuss complications of pneumonitis,GI upset, hair loss, mouth ulcers--the later minimized with Folic Acid.   Discuss meds take 3-4 months to work, though may have some improvement earlier Discuss little to no alcohol intake on MTX Start Methotrexate 2.5 mg at 4 tabs once weekly with Folic Acid at 1 mg daily.   F/U in 2-3 weeks with CBC, CMET.  and increase to 6 tabs then if fine.  Leave on 6 tabs for 3-4 mos, checking labs every 6 weeks.   at the 3-4  mos follow up, may increase to 8 tabs if not controlled Eventually labs to be checked every 3-4 months. Orders: T-Rheumatoid Factor [id number]) T-Hepatitis C Antibody ([id number]) T-Hepatitis B Surface Antigen [id number]) T-Hepatitis B Surface Antibody ([id number]) T-Hepatitis B Core Antibody ([id number]) T-Hepatitis A Antibody ([id number]) T-Comprehensive Metabolic Panel ([id number]) T-HIV Antibody  (Reflex) ([id number]) T- * Misc. Laboratory test [id number]) Diagnostic X-Ray/Fluoroscopy (Diagnostic X-Ray/Flu)  Problem # 2:  IDDM (ICD-250.01)  Her updated medication list for this problem includes:    Novolog Flexpen 100 Unit/ml Soln  (Insulin aspart) ..... Sliding scale before each meal:  <100:  0 units;  101-150: 4 u ; 151-200: 6 u; 201-250:8 u; 251-300: 10 u; 301-350: 12 u;351-400:14 u; 401-450: 16 u.    Lantus For Opticlik 100 Unit/ml Soln (Insulin glargine) ..... Inject 45 units subcutaneously  every  morning    Lisinopril 5 Mg Tabs (Lisinopril) .Marland Kitchen... 1/2 tab by mouth daily--decrease by dr. Eldridge Dace.  Orders: T-Urine Microalbumin w/creat. ratio [id number])  Complete Medication List: 1)  Protonix 40 Mg Solr (Pantoprazole sodium) .Marland Kitchen.. 1 by mouth once daily 2)  Zoloft 100 Mg Tabs (Sertraline hcl) .Marland Kitchen.. 1 by mouth qday 3)  Tylenol Extra Strength 500 Mg Tabs (Acetaminophen) .... 2 tabs po q 4-6 hrs as needed for fever,pain,headache. not to exceed 8 tabs in 24hrs. 4)  Novolog Flexpen 100 Unit/ml Soln (Insulin aspart) .... Sliding scale before each meal:  <100:  0 units;  101-150: 4 u ; 151-200: 6 u; 201-250:8 u; 251-300: 10 u; 301-350: 12 u;351-400:14 u; 401-450: 16 u. 5)  Lantus Pen Needles  .... Use with lantus pen  dx=250.3 severe risk hypoglycemia 6)  Lantus For Opticlik 100 Unit/ml Soln (Insulin glargine) .... Inject 45 units subcutaneously  every  morning 7)  Vistaril 25 Mg Caps (Hydroxyzine pamoate) .... Take 1 capsule by mouth every 8 hours as needed for anxiety 8)  Reglan 10 Mg Tabs (Metoclopramide hcl) .... Take one tablet prior to each meal and at bedtime 9)  Crestor 5 Mg Tabs (Rosuvastatin calcium) .... Take 1 tab by mouth daily 10)  Tramadol Hcl 50 Mg Tabs (Tramadol hcl) .... Take 1-2 tablets by mouth every 6 hours as needed for knee pain. can also be taken with tylenol. 11)  Pepcid 20 Mg Tabs (Famotidine) .... Take 1 tablet by mouth every 12 hours for stomach 12)  Lisinopril 5 Mg Tabs (Lisinopril) .... 1/2 tab by mouth daily--decrease by dr. Eldridge Dace. 13)  Plavix 75 Mg Tabs (Clopidogrel bisulfate) .... One tablet by mouth daily 14)  Metoprolol Tartrate 25 Mg Tabs (Metoprolol tartrate) .... 1/2 tablet by  mouth two times a day 15)  Nitroglycerin 0.4 Mg/hr Pt24 (Nitroglycerin) .... Use as directed  Other Orders: Capillary Blood Glucose/CBG ([id number]) T-Hepatitis C Antibody ([id number])  Patient Instructions: 1)  Follow up with Dr. Delrae Alfred in 2 weeks

## 2010-09-01 NOTE — Progress Notes (Signed)
Summary: DROPPED OFF [facility] PAPER   Phone Note Call from Patient Call back at [facility] Phone [phone]   Summary of Call: MULBERRY PT. Kaileia DROPPED OFF A PAPER FROM [facility]. TO BE SIGNED AND TURNED BACK IN BEFORE 09/02 AND THEY TOLD HER THAT SHE WILL NEED TO GET A WRITTEN RX FOR REHAB ON HER SHOULDER AND WRIST ON THE RIGHT SIDE. Initial call taken by: Leodis Rains,  March 22, 2010 12:21 PM  Follow-up for Phone Call        Elbony called and says that she needs to have the paper back by this friday Follow-up by: Leodis Rains,  March 29, 2010 8:26 AM  Additional Follow-up for Phone Call Additional follow up Details #1::        She had a previous order for the right wrist and shoulder--not sure why this wasn't addressed with previous order. All I can find in paperwork is her rehab papers stating that she is being discharged as she has improved and is satisfied with the improvement of her knee--if there is something more, I do not see it.  I have rewritten the order for her shoulder and wrist--please make sure Arna Medici gets this. Additional Follow-up by: Julieanne Manson MD,  March 30, 2010 8:01 AM    Additional Follow-up for Phone Call Additional follow up Details #2::    PT AWARE Chantel Miller  March 30, 2010 10:06 AM   Additional Follow-up for Phone Call Additional follow up Details #3:: Details for Additional Follow-up Action Taken: PT HAVE AN APPT PT 04-01-10 @ 8:45 AM WRIST & SHOULDER PAIN . PT AWARE OF HER APPT Additional Follow-up by: Cheryll Dessert,  March 30, 2010 12:05 PM

## 2010-09-01 NOTE — Progress Notes (Signed)
Summary: refill   Phone Note Call from Patient Call back at Home Phone [phone]   Summary of Call: Pt doesn't know if the provider sent a refills request from her vistaril to the pharmacy. [facility]) [name]  August 13, 2009 12:49 PM  Follow-up for Phone Call        Last one was done 06/08/09 for #60/1. Will fwd to Dr. Delrae Alfred for refill Berkley Harvey. Follow-up by: Vesta Mixer CMA,  August 16, 2009 9:42 AM  Additional Follow-up for Phone Call Additional follow up Details #1::        Pt. in for office visit today--really doesn't know why she is taking the Vistaril--discussed it is for anxiety--she states she is doing okay with that currenly and was just taking the Vistaril because "I was told to"  She is to call if feels anxiety returning Additional Follow-up by: Julieanne Manson MD,  August 17, 2009 6:31 PM

## 2010-09-01 NOTE — Progress Notes (Signed)
   Phone Note Call from Patient   Summary of Call: Pt is in the hospital with the norovirus and missed her weekly dose of methotrexate.  She usally takes Wednesday mornings.  She is to go home this afternoon and wants to know if she can take it then.   [phone]. Initial call taken by: Vesta Mixer CMA,  November 04, 2009 9:22 AM  Follow-up for Phone Call        Per Dr. Delrae Alfred skip the methotrexate this week.  Pt's phone busy will keep calling to inform her.  Follow-up by: Vesta Mixer CMA,  November 04, 2009 11:03 AM  Additional Follow-up for Phone Call Additional follow up Details #1::        pt aware. Additional Follow-up by: Vesta Mixer CMA,  November 04, 2009 11:43 AM

## 2010-09-01 NOTE — Assessment & Plan Note (Signed)
Summary: 2 WEEK FU FOR CBC/CMET AND OV//KT   Vital Signs:  Patient profile:   45 year old female Menstrual status:  regular Weight:      186 pounds Temp:     98.1 degrees F Pulse rate:   97 / minute Pulse rhythm:   regular Resp:     20 per minute BP sitting:   106 / 68  (left arm) Cuff size:   regular  Vitals Entered By: Mikey College CMA (October 22, 2009 9:53 AM) CC: 2 week f/u and needs labs also Is Patient Diabetic? Yes Pain Assessment Patient in pain? no      CBG Result 91  Does patient need assistance? Ambulation Normal   Primary Care Provider:  [facility]  CC:  2 week f/u and needs labs also.  History of Present Illness: 1.  RA:  Did get started on MTX.  Taking 4 tabs on Wednesday of each week.   Has taken for 2 weeks now.  No mouth ulcers--pt. does have sore gums, but has been a problem from dentures for a while--has appt. with her dentist soon.  Currently, no significant joint swelling or stiffness.  Is using Tramadol really just about once- twice weekly.  Has appt. with [facility] on April 18th.  Due for blood work today.  Developed "knots"  on PIP joint of right index finger and one on extensor surface of left elbow in past week or so--nontender.  Right knee with some stiffness on flexion on further joint review with patient.  2.   DM:  A1C recently with Diabetic Nutrition was down to almost 7.0%--I believe I saw a 7.2 % on correspondence, though not yet scanned into chart. Using 45 units of Lantus.  Still not eating regularly and having low blood sugars at times because of it.  Allergies (verified): 1)  ! Codeine  Physical Exam  Extremities:  cyst - like lesion without erythema on dorsal surface of right index PIP.  Larger  cystic lesion on extensor surface of left elbow--just distal to olecranon.   Impression & Recommendations:  Problem # 1:  ARTHRITIS, RHEUMATOID (ICD-714.0) Pt. appears to be developing ganglion cysts--though not sure if could be  rheumatoid nodules. Increase MTX to 6 tabs weekly. Pt. now with Medicaid. Her updated medication list for this problem includes:    Methotrexate 2.5 Mg Tabs (Methotrexate sodium) .Marland KitchenMarland KitchenMarland KitchenMarland Kitchen 6 tabs by mouth once weekly--take all tabs at same time, same day each week  Problem # 2:  IDDM (ICD-250.01) Better control--discussed trying to fine tune by working on regular eating habits Her updated medication list for this problem includes:    Novolog Flexpen 100 Unit/ml Soln (Insulin aspart) ..... Sliding scale before each meal:  <100:  0 units;  101-150: 4 u ; 151-200: 6 u; 201-250:8 u; 251-300: 10 u; 301-350: 12 u;351-400:14 u; 401-450: 16 u.    Lantus For Opticlik 100 Unit/ml Soln (Insulin glargine) ..... Inject 45 units subcutaneously  every  morning    Lisinopril 5 Mg Tabs (Lisinopril) .Marland Kitchen... 1 tab by mouth daily  Complete Medication List: 1)  Protonix 40 Mg Solr (Pantoprazole sodium) .Marland Kitchen.. 1 by mouth once daily 2)  Zoloft 100 Mg Tabs (Sertraline hcl) .Marland Kitchen.. 1 by mouth qday 3)  Tylenol Extra Strength 500 Mg Tabs (Acetaminophen) .... 2 tabs po q 4-6 hrs as needed for fever,pain,headache. not to exceed 8 tabs in 24hrs. 4)  Novolog Flexpen 100 Unit/ml Soln (Insulin aspart) .... Sliding scale before each meal:  <  100:  0 units;  101-150: 4 u ; 151-200: 6 u; 201-250:8 u; 251-300: 10 u; 301-350: 12 u;351-400:14 u; 401-450: 16 u. 5)  Lantus Pen Needles  .... Use with lantus pen  dx=250.3 severe risk hypoglycemia 6)  Lantus For Opticlik 100 Unit/ml Soln (Insulin glargine) .... Inject 45 units subcutaneously  every  morning 7)  Vistaril 25 Mg Caps (Hydroxyzine pamoate) .... Take 1 capsule by mouth every 8 hours as needed for anxiety 8)  Reglan 10 Mg Tabs (Metoclopramide hcl) .... Take one tablet prior to each meal and at bedtime 9)  Crestor 5 Mg Tabs (Rosuvastatin calcium) .... Take 1 tab by mouth daily 10)  Tramadol Hcl 50 Mg Tabs (Tramadol hcl) .... Take 1-2 tablets by mouth every 6 hours as needed for knee pain.  can also be taken with tylenol. 11)  Pepcid 20 Mg Tabs (Famotidine) .... Take 1 tablet by mouth every 12 hours for stomach 12)  Lisinopril 5 Mg Tabs (Lisinopril) .Marland Kitchen.. 1 tab by mouth daily 13)  Plavix 75 Mg Tabs (Clopidogrel bisulfate) .... One tablet by mouth daily 14)  Metoprolol Tartrate 25 Mg Tabs (Metoprolol tartrate) .... 1/2 tablet by mouth two times a day 15)  Nitroglycerin 0.4 Mg/hr Pt24 (Nitroglycerin) .... Use as directed 16)  Methotrexate 2.5 Mg Tabs (Methotrexate sodium) .... 6 tabs by mouth once weekly--take all tabs at same time, same day each week 17)  Folic Acid 1 Mg Tabs (Folic acid) .Marland Kitchen.. 1 tab by mouth daily  Other Orders: Capillary Blood Glucose/CBG ([address]) T-Comprehensive Metabolic Panel ([address]454-09811) T-CBC w/Diff ([address]1478-29562)  Patient Instructions: 1)  CMET and CBC in 2 weeks with Winry Egnew OV Prescriptions: FOLIC ACID 1 MG TABS (FOLIC ACID) 1 tab by mouth daily  #30 x 11   Entered and Authorized by:   Julieanne Manson MD   Signed by:   Julieanne Manson MD on 10/22/2009   Method used:   Electronically to        [facility] [phone]* (retail)       [address]       [place], [place]  [address]       Ph: [phone]       Fax: [phone]   RxID:   [id number] PLAVIX 75 MG TABS (CLOPIDOGREL BISULFATE) One tablet by mouth daily  #30 x 11   Entered and Authorized by:   Julieanne Manson MD   Signed by:   Julieanne Manson MD on 10/22/2009   Method used:   Electronically to        [facility] [phone]* (retail)       [address]       [place], [place]  [address]       Ph: [phone]       Fax: [phone]   RxID:   [id number] METOPROLOL TARTRATE 25 MG TABS (METOPROLOL TARTRATE) 1/2 tablet by mouth two times a day  #30 x 11   Entered and Authorized by:   Julieanne Manson MD   Signed by:   Julieanne Manson MD on 10/22/2009   Method used:   Electronically to        [facility] [phone]* (retail)       [address]       [place], [place]  [address]       Ph: [phone]       Fax: [phone]   RxID:   [id number] LISINOPRIL 5 MG TABS (LISINOPRIL) 1 tab by mouth daily  #  30 x 11   Entered and Authorized by:   Julieanne Manson MD   Signed by:   Julieanne Manson MD on 10/22/2009   Method used:   Electronically to        [facility] [phone]* (retail)       [address]       [place], [place]  [address]       Ph: [phone]       Fax: [phone]   RxID:   [id number] REGLAN 10 MG  TABS (METOCLOPRAMIDE HCL) Take one tablet prior to each meal and at bedtime  #120 x 11   Entered and Authorized by:   Julieanne Manson MD   Signed by:   Julieanne Manson MD on 10/22/2009   Method used:   Electronically to        [facility] [phone]* (retail)       [address]       [place], [place]  [address]       Ph: [phone]       Fax: [phone]   RxID:   [id number] ZOLOFT 100 MG  TABS (SERTRALINE HCL) 1 by mouth qday  #30 x 11   Entered and Authorized by:   Julieanne Manson MD   Signed by:   Julieanne Manson MD on 10/22/2009   Method used:   Electronically to        [facility] [phone]* (retail)       [address]       [place], [place]  [address]       Ph: [phone]       Fax: [phone]   RxID:   [id number] PROTONIX 40 MG  SOLR (PANTOPRAZOLE SODIUM) 1 by mouth once daily  #30 x 11   Entered and Authorized by:   Julieanne Manson MD   Signed by:   Julieanne Manson MD on 10/22/2009   Method used:   Electronically to        [facility] [phone]* (retail)       [address]       [place], [place]  [address]       Ph: [phone]       Fax: [phone]   RxID:   [id number] METHOTREXATE 2.5 MG TABS (METHOTREXATE SODIUM) 6 tabs by mouth once weekly--take all tabs at same time, same day each week  #24 x 0   Entered and Authorized by:   Julieanne Manson MD   Signed by:   Julieanne Manson MD on 10/22/2009   Method used:    Electronically to        [facility] [phone]* (retail)       [address]       [place], [place]  [address]       Ph: [phone]       Fax: [phone]   RxID:   [id number]   Appended Document: 2 WEEK FU FOR CBC/CMET AND OV//KT Please call Burna Mortimer at [facility] pharmacy--pt. now on Medicaid--can she still get ICP meds for rest of current term?  Appended Document: 2 WEEK FU FOR CBC/CMET AND OV//KT Per Burna Mortimer if pt has Medicaid, she can not be on ICP.  Will need to get future meds via outside pharmacy.

## 2010-09-01 NOTE — Progress Notes (Signed)
Summary: NEEDS TEST STRIPS CALLED IN   Medications Added PRESTIGE TEST  STRP (GLUCOSE BLOOD) 4 times daily testing       Phone Note Call from Patient Call back at Home Phone [phone]   Summary of Call: [name] PT.  [name] [name] HAS MEDICAID AND SHE SAYS THAT SHE NEEDS TEST STRIPS FOR HER PRESTIGE MACHINE. SHE USES [facility] ON [address].  SHE ALSO WANTS TO LET YOU KNOW THAT SHE WAS ADMITTED TO [place] ON THIS PAST MONDAY MORNING Initial call taken by: Leodis Rains,  November 03, 2009 9:46 AM    New/Updated Medications: PRESTIGE TEST  STRP (GLUCOSE BLOOD) 4 times daily testing Prescriptions: PRESTIGE TEST  STRP (GLUCOSE BLOOD) 4 times daily testing  #120 x 11   Entered and Authorized by:   Julieanne Manson MD   Signed by:   Julieanne Manson MD on 11/03/2009   Method used:   Electronically to        [facility] [phone]* (retail)       [address]       [place], [place]  [address]       Ph: [phone]       Fax: [phone]   RxID:   [id number]

## 2010-09-01 NOTE — Progress Notes (Signed)
Summary: STILL FEELING NASUATED   Phone Note Call from Patient Call back at Home Phone [phone]   Summary of Call: [name] [name] PT. MS Droke RETURNED DR MULBERRYS CALL TO LET HER KNOW THAT STILL SOME FATIGUE AND NASUATED, AND SHE SAYS THAT SHE HAS TAKEN ALL OF HER NASUA MEDICINE. Initial call taken by: Leodis Rains,  Dec 22, 2009 10:39 AM  Follow-up for Phone Call        Sent to Dr. Delrae Alfred.  Dutch Quint RN  Dec 23, 2009 12:35 PM   Additional Follow-up for Phone Call Additional follow up Details #1::        If it is still causing problem with ability to keep food down or eat appropriately, she needs to make an appt. to seem me in next couple of weeks. Additional Follow-up by: Julieanne Manson MD,  Dec 23, 2009 6:04 PM    Additional Follow-up for Phone Call Additional follow up Details #2::    Left message with female to have pt. return call. Gaylyn Cheers RN 12/28/09 10:56  Additional Follow-up for Phone Call Additional follow up Details #3:: Details for Additional Follow-up Action Taken: Pt. returned call, she was suppose to have lab appt. this am, continues to complain of nausea appt. given for this afternoon. Additional Follow-up by: Gaylyn Cheers RN,  Dec 28, 2009 12:19 PM

## 2010-09-01 NOTE — Progress Notes (Signed)
Summary: NNEDS RX FOR NEW METER, STRIPS, & LANCETS  Medications Added PRODIGY BLOOD GLUCOSE MONITOR W/DEVICE KIT (BLOOD GLUCOSE MONITORING SUPPL) 4 times daily blood sugars PRODIGY BLOOD GLUCOSE TEST  STRP (GLUCOSE BLOOD) 4 times daily blood testing PRODIGY LANCETS 26G  MISC (LANCETS) 4 times daily blood testing       Phone Note Call from Patient Call back at Home Phone [phone]   Summary of Call: [name] [name] PT. Sundai CALLED BACK TO LET YOU KNOW THAT [facility] DOES NOT CARRY PRODIGY METERS, SO SHE WILL NEED FOR [place] TO CALL IN A NEW GLUCOSE METER, STRIPS AND LANCETS THAT MEDICAID WILL COVER. SHE USES [facility] ON [address] Initial call taken by: Leodis Rains,  November 05, 2009 3:17 PM  Follow-up for Phone Call        [name] [name] called back and said they do have the prodigy she just needs it sent in. Follow-up by: Vesta Mixer CMA,  November 05, 2009 4:26 PM  Additional Follow-up for Phone Call Additional follow up Details #1::        Pt needs the provider fax a prescription for a new meter and strips.[name]  November 05, 2009 4:40 PM    New/Updated Medications: PRODIGY BLOOD GLUCOSE MONITOR W/DEVICE KIT (BLOOD GLUCOSE MONITORING SUPPL) 4 times daily blood sugars PRODIGY BLOOD GLUCOSE TEST  STRP (GLUCOSE BLOOD) 4 times daily blood testing PRODIGY LANCETS 26G  MISC (LANCETS) 4 times daily blood testing Prescriptions: PRODIGY LANCETS 26G  MISC (LANCETS) 4 times daily blood testing  #120 x 11   Entered and Authorized by:   Julieanne Manson MD   Signed by:   Julieanne Manson MD on 11/07/2009   Method used:   Electronically to        [facility] [phone]* (retail)       [address]       [place], [place]  [address]       Ph: [phone]       Fax: [phone]   RxID:   [id number] PRODIGY BLOOD GLUCOSE TEST  STRP (GLUCOSE BLOOD) 4 times daily blood testing  #120 x 11   Entered and Authorized by:   Julieanne Manson MD   Signed by:   Julieanne Manson MD on 11/07/2009   Method used:   Electronically to        [facility] [phone]* (retail)       [address]       [place], [place]  [address]       Ph: [phone]       Fax: [phone]   RxID:   [id number] PRODIGY BLOOD GLUCOSE MONITOR W/DEVICE KIT (BLOOD GLUCOSE MONITORING SUPPL) 4 times daily blood sugars  #1 x 0   Entered and Authorized by:   Julieanne Manson MD   Signed by:   Julieanne Manson MD on 11/07/2009   Method used:   Electronically to        [facility] [phone]* (retail)       [address]       [place], [place]  [address]       Ph: [phone]       Fax: [phone]   RxID:   [id number]

## 2010-09-01 NOTE — Letter (Signed)
Summary: *Referral Letter  [facility]  [address] [place], [place] [address]   Phone: [phone]  Fax: [phone]    04/28/2010  Thank you in advance for agreeing to see my patient:  [name] [name] [address] [place], [place]  [address]  Phone: [phone]  Reason for Referral: Right shoulder pain.  Has improved significantly with PT, particularly with ROM, but still with a fair amt of pain.  Would like you to see if anything more would be helpful.  This is not felt to be secondary to her RA--she had a fall earlier this year that set it off.    Also with cyst over right index PIP--pt. having difficulty leaving alone.  I have assumed secondary to her RA or degenerative changes and have asked her to leave alone for now.  She would like you to look at this as well.  Procedures Requested: Evaluation and treatment of above.  Current Medical Problems: 1)  ANEMIA (ICD-285.9) 2)  WRIST PAIN, RIGHT (ICD-719.43) 3)  SHOULDER PAIN, RIGHT (ICD-719.41) 4)  KNEE INJURY, LEFT (ICD-959.7) 5)  ALLERGIC RHINITIS (ICD-477.9) 6)  NAUSEA (ICD-787.02) 7)  GASTROENTERITIS, NOROVIRUS, ACUTE (ICD-008.8) 8)  ENCOUNTER FOR LONG-TERM USE OF OTHER MEDICATIONS (ICD-V58.69) 9)  ESSENTIAL HYPERTENSION (ICD-401.9) 10)  ARTHRITIS, RHEUMATOID (ICD-714.0) 11)  BRONCHITIS NOT SPECIFIED AS ACUTE OR CHRONIC (ICD-490) 12)  POLYARTHRITIS (ICD-719.49) 13)  UNSPECIFIED BREAST SCREENING (ICD-V76.10) 14)  VIRAL GASTROENTERITIS (ICD-008.8) 15)  DIABETIC  RETINOPATHY (ICD-250.50) 16)  HYPOGLYCEMIA (ICD-251.2) 17)  ACUTE OSTEOMYELITIS, ANKLE AND FOOT (ICD-730.07) 18)  PAIN IN JOINT, ANKLE AND FOOT (ICD-719.47) 19)  DIABETIC PERIPHERAL NEUROPATHY (ICD-250.60) 20)  ULCER, ESOPHAGUS WITHOUT BLEEDING (ICD-530.20) 21)  ESOPHAGEAL STENOSIS (ICD-530.3) 22)  GASTROPARESIS, DIABETIC (ICD-250.60) 23)  RENAL INSUFFICIENCY, CHRONIC (ICD-585.9) 24)  SUBSTANCE ABUSE, MULTIPLE  (ICD-305.90) 25)  IDDM (ICD-250.01) 26)  GERD (ICD-530.81) 27)  HYPERLIPIDEMIA (ICD-272.4) 28)  DEPRESSION (ICD-311) 29)  ANXIETY (ICD-300.00)   Current Medications: 1)  OMEPRAZOLE 40 MG CPDR (OMEPRAZOLE) 1 cap by mouth 1/2 hour before first meal of day. 2)  SERTRALINE HCL 100 MG TABS (SERTRALINE HCL) 1 1/2 tabs by mouth daily 3)  TYLENOL EXTRA STRENGTH 500 MG  TABS (ACETAMINOPHEN) 2 tabs Po q 4-6 hrs as needed for fever,pain,headache. Not to exceed 8 tabs in 24hrs. 4)  NOVOLOG FLEXPEN 100 UNIT/ML SOLN (INSULIN ASPART) Sliding scale ac:  <100:  0 units;  101-150: 4 u ; 151-200: 6 u; 201-250:8 u; 251-300: 10 u; 301-350: 12 u;351-400:14 u; 401-450: 16 u. 5)  * LANTUS PEN NEEDLES Use with lantus pen  Dx=250.3 Severe risk hypoglycemia 6)  LANTUS FOR OPTICLIK 100 UNIT/ML  SOLN (INSULIN GLARGINE) Inject 45 units Subcutaneously  [name]  morning 7)  REGLAN 10 MG  TABS (METOCLOPRAMIDE HCL) Take one tablet prior to each meal and at bedtime 8)  SIMVASTATIN 40 MG TABS (SIMVASTATIN) 1 tab by mouth daily 9)  TRAMADOL HCL 50 MG TABS (TRAMADOL HCL) Take 1-2 tablets by mouth [name] 6 hours as needed for knee pain. Can also be taken with tylenol. 10)  PEPCID 20 MG TABS (FAMOTIDINE) Take 1 tablet by mouth [name] 12 hours for stomach 11)  LISINOPRIL 5 MG TABS (LISINOPRIL) 1 tab by mouth daily 12)  PLAVIX 75 MG TABS (CLOPIDOGREL BISULFATE) One tablet by mouth daily 13)  METOPROLOL TARTRATE 25 MG TABS (METOPROLOL TARTRATE) 1/2 tablet by mouth two times a day 14)  NITROGLYCERIN 0.4 MG/HR PT24 (NITROGLYCERIN) 1 tab sublingual as needed chest pain.  May repeat [name] 5 minutes x2 if not relieved. 15)  METHOTREXATE 2.5 MG TABS (METHOTREXATE SODIUM) 6 tabs by mouth once weekly--take all tabs at same time, same day each week 16)  FOLIC ACID 1 MG TABS (FOLIC ACID) 1 tab by mouth daily 17)  PROMETHAZINE HCL 25 MG TABS (PROMETHAZINE HCL) take [name] six hours as needed for nausea 18)  PRODIGY BLOOD GLUCOSE MONITOR W/DEVICE  KIT (BLOOD GLUCOSE MONITORING SUPPL) 4 times daily blood sugars 19)  PRODIGY BLOOD GLUCOSE TEST  STRP (GLUCOSE BLOOD) 4 times daily blood testing 20)  PRODIGY LANCETS 26G  MISC (LANCETS) 4 times daily blood testing 21)  FERROUS SULFATE 325 (65 FE) MG TABS (FERROUS SULFATE) 1 tab by mouth daily   Past Medical History: 1)  IDDM 2)  H/O DKA-MULTIPLE HOSP 3)  HX PANCREATITIS 4)  POLYSUBSTANCE ABUSE (COCAINE,ETOH) 5)  ANEMIA 6)  ALCOHOLIC GASTRTITS 7)  PROTIENURIA 8)  GERD 9)  DIABETIC GASTROPARESIS 10)  SEVERE ESOPHAGEAL STENOSIS/ULCERATION 11)  CHRONIC RENAL INSUFFICIENCY 12)  Anxiety 13)  Depression 14)  Hyperlipidemia   Prior History of Blood Transfusions:   Pertinent Labs:    Thank you again for agreeing to see our patient; please contact us if you have any further questions or need additional information.  Sincerely,  Julieanne Manson MD

## 2010-09-01 NOTE — Assessment & Plan Note (Signed)
Summary: JOINTS HURT/STOMACH HURTS/KNEE SWOLL//KT   Vital Signs:  Patient profile:   45 year old female Menstrual status:  regular Weight:      176 pounds Temp:     98.3 degrees F Pulse rate:   97 / minute Pulse rhythm:   regular Resp:     16 per minute BP sitting:   101 / 68  (left arm) Cuff size:   regular  Vitals Entered By: Vesta Mixer CMA (February 02, 2010 12:24 PM) CC: joints hurting all over. Swelling on right hand Is Patient Diabetic? Yes Pain Assessment Patient in pain? yes     Location: all over Intensity: 10 CBG Result 256  Does patient need assistance? Ambulation Impaired:Risk for fall    Primary Care Provider:  [facility]  CC:  joints hurting all over. Swelling on right hand.  History of Present Illness: 1.  Rheumatoid Arthritis:  Pt. states she has been using Methotrexate weekly--taking 6 tabs every week.  Pt. states she only has 4 tabs to take today and is having a flair of pain and swelling in right shoulder, dorsum of hand, most of PIPs of that hand and left knee as well.  Does not have follow up with Rheumatology until 02/14/10.   Unclear why she is out of MTX before her follow up date--pt. states she did not miss an appt.  Gets meds at [facility].  Pt. refuses to take corticosteroids.  Allergies (verified): 1)  ! Codeine  Physical Exam  Extremities:  Dorsum of right hand swollen and tender--along dorsal tendons.  Limited movement of wrist secondary to pain and swelling.  All right handed PIPs swollen and boggy with tenderness.  Unable to lift shoulder above 90 degress in abduction or flexion.   Left knee with obvious swelling and effusion.  No definite erythema.  Decreased flexion.   Impression & Recommendations:  Problem # 1:  ARTHRITIS, RHEUMATOID (ICD-714.0) Flair--Pt. has prednisone at home, 10 mg tabs Her updated medication list for this problem includes:    Methotrexate 2.5 Mg Tabs (Methotrexate sodium) .Marland KitchenMarland KitchenMarland KitchenMarland Kitchen 6 tabs by mouth once  weekly--take all tabs at same time, same day each week  Problem # 2:  IDDM (ICD-250.01) To watch sugars and call if get higher than 250 regularly Her updated medication list for this problem includes:    Novolog Flexpen 100 Unit/ml Soln (Insulin aspart) ..... Sliding scale ac:  <100:  0 units;  101-150: 4 u ; 151-200: 6 u; 201-250:8 u; 251-300: 10 u; 301-350: 12 u;351-400:14 u; 401-450: 16 u.    Lantus For Opticlik 100 Unit/ml Soln (Insulin glargine) ..... Inject 45 units subcutaneously  every  morning    Lisinopril 5 Mg Tabs (Lisinopril) .Marland Kitchen... 1 tab by mouth daily  Complete Medication List: 1)  Omeprazole 40 Mg Cpdr (Omeprazole) .Marland Kitchen.. 1 cap by mouth 1/2 hour before first meal of day. 2)  Sertraline Hcl 100 Mg Tabs (Sertraline hcl) .Marland Kitchen.. 1 1/2 tabs by mouth daily 3)  Tylenol Extra Strength 500 Mg Tabs (Acetaminophen) .... 2 tabs po q 4-6 hrs as needed for fever,pain,headache. not to exceed 8 tabs in 24hrs. 4)  Novolog Flexpen 100 Unit/ml Soln (Insulin aspart) .... Sliding scale ac:  <100:  0 units;  101-150: 4 u ; 151-200: 6 u; 201-250:8 u; 251-300: 10 u; 301-350: 12 u;351-400:14 u; 401-450: 16 u. 5)  Lantus Pen Needles  .... Use with lantus pen  dx=250.3 severe risk hypoglycemia 6)  Lantus For Opticlik 100 Unit/ml Soln (  Insulin glargine) .... Inject 45 units subcutaneously  every  morning 7)  Reglan 10 Mg Tabs (Metoclopramide hcl) .... Take one tablet prior to each meal and at bedtime 8)  Simvastatin 40 Mg Tabs (Simvastatin) .Marland Kitchen.. 1 tab by mouth daily 9)  Tramadol Hcl 50 Mg Tabs (Tramadol hcl) .... Take 1-2 tablets by mouth every 6 hours as needed for knee pain. can also be taken with tylenol. 10)  Pepcid 20 Mg Tabs (Famotidine) .... Take 1 tablet by mouth every 12 hours for stomach 11)  Lisinopril 5 Mg Tabs (Lisinopril) .Marland Kitchen.. 1 tab by mouth daily 12)  Plavix 75 Mg Tabs (Clopidogrel bisulfate) .... One tablet by mouth daily 13)  Metoprolol Tartrate 25 Mg Tabs (Metoprolol tartrate) .... 1/2 tablet  by mouth two times a day 14)  Nitroglycerin 0.4 Mg/hr Pt24 (Nitroglycerin) .... Use as directed 15)  Methotrexate 2.5 Mg Tabs (Methotrexate sodium) .... 6 tabs by mouth once weekly--take all tabs at same time, same day each week 16)  Folic Acid 1 Mg Tabs (Folic acid) .Marland Kitchen.. 1 tab by mouth daily 17)  Promethazine Hcl 25 Mg Tabs (Promethazine hcl) .... Take every six hours as needed for nausea 18)  Prodigy Blood Glucose Monitor W/device Kit (Blood glucose monitoring suppl) .... 4 times daily blood sugars 19)  Prodigy Blood Glucose Test Strp (Glucose blood) .... 4 times daily blood testing 20)  Prodigy Lancets 26g Misc (Lancets) .... 4 times daily blood testing  Other Orders: Capillary Blood Glucose/CBG ([address]) T-CBC w/Diff ([address]11-91478) T-Comprehensive Metabolic Panel ([id number])  Patient Instructions: 1)  Prednisone 10 mg  2)  Today:  2 tabs 3)  7/7: 2 tabs 4)  7/8: 1 1/2 tabs 5)  7/9:  1 1/2 tabs 6)  7/10:  1 tab 7)  7/11:  1 tab 8)  7/12:  1 tab 9)  7/13:  1 tab 10)  7/14:  1/2 tab 11)  7/15:  1/2 tab 12)  7/16:  1/2 tab 13)  7/17:  1/2 tab 14)  7/18:  off 15)  Call if no improvement or if pain and swellling returns as you taper off prednisone 16)  CBC and CMEt--labs in 6 weeks.

## 2010-09-01 NOTE — Letter (Signed)
Summary: External OtherPROGRESS NOTE  External OtherPROGRESS NOTE   Imported By: Arta Bruce 12/20/2009 12:41:21  _____________________________________________________________________  External Attachment:    Type:   Image     Comment:   External Document

## 2010-09-04 ENCOUNTER — Emergency Department (HOSPITAL_COMMUNITY): Payer: Medicaid Other

## 2010-09-04 ENCOUNTER — Observation Stay (HOSPITAL_COMMUNITY)
Admission: EM | Admit: 2010-09-04 | Discharge: 2010-09-05 | DRG: 303 | Disposition: A | Discharge status: Home / Self Care | Payer: Medicaid Other | Attending: Internal Medicine | Admitting: Internal Medicine

## 2010-09-04 DIAGNOSIS — Z794 Long term (current) use of insulin: Secondary | ICD-10-CM

## 2010-09-04 DIAGNOSIS — E785 Hyperlipidemia, unspecified: Secondary | ICD-10-CM | POA: Diagnosis present

## 2010-09-04 DIAGNOSIS — I209 Angina pectoris, unspecified: Secondary | ICD-10-CM | POA: Diagnosis present

## 2010-09-04 DIAGNOSIS — Z950 Presence of cardiac pacemaker: Secondary | ICD-10-CM

## 2010-09-04 DIAGNOSIS — I2510 Atherosclerotic heart disease of native coronary artery without angina pectoris: Principal | ICD-10-CM | POA: Diagnosis present

## 2010-09-04 DIAGNOSIS — Z7982 Long term (current) use of aspirin: Secondary | ICD-10-CM

## 2010-09-04 DIAGNOSIS — F17200 Nicotine dependence, unspecified, uncomplicated: Secondary | ICD-10-CM | POA: Diagnosis present

## 2010-09-04 DIAGNOSIS — R0789 Other chest pain: Secondary | ICD-10-CM

## 2010-09-04 DIAGNOSIS — R0602 Shortness of breath: Secondary | ICD-10-CM

## 2010-09-04 DIAGNOSIS — I252 Old myocardial infarction: Secondary | ICD-10-CM

## 2010-09-04 DIAGNOSIS — Z7902 Long term (current) use of antithrombotics/antiplatelets: Secondary | ICD-10-CM

## 2010-09-04 DIAGNOSIS — E119 Type 2 diabetes mellitus without complications: Secondary | ICD-10-CM | POA: Diagnosis present

## 2010-09-04 DIAGNOSIS — Z9861 Coronary angioplasty status: Secondary | ICD-10-CM

## 2010-09-04 LAB — HEPARIN LEVEL (UNFRACTIONATED): Heparin Unfractionated: 0.18 IU/mL — ABNORMAL LOW (ref 0.30–0.70)

## 2010-09-04 LAB — CBC
MCHC: 32.7 g/dL (ref 30.0–36.0)
Platelets: 337 10*3/uL (ref 150–400)
RDW: 15.5 % (ref 11.5–15.5)
WBC: 9.5 10*3/uL (ref 4.0–10.5)

## 2010-09-04 LAB — POCT CARDIAC MARKERS
CKMB, poc: <1 ng/mL — ABNORMAL LOW (ref 1.0–8.0)
CKMB, poc: <1 ng/mL — ABNORMAL LOW (ref 1.0–8.0)
Myoglobin, poc: 77.5 ng/mL (ref 12–200)
Myoglobin, poc: 57.1 ng/mL (ref 12–200)
Troponin i, poc: <0.05 ng/mL (ref 0.00–0.09)

## 2010-09-04 LAB — BASIC METABOLIC PANEL
Calcium: 9.6 mg/dL (ref 8.4–10.5)
Chloride: 110 mEq/L (ref 96–112)
Creatinine, Ser: 0.9 mg/dL (ref 0.4–1.2)
GFR calc Af Amer: >60 mL/min (ref >60)
GFR calc non Af Amer: >60 mL/min (ref >60)

## 2010-09-04 LAB — GLUCOSE, CAPILLARY: Glucose-Capillary: 141 mg/dL — ABNORMAL HIGH (ref 70–99)

## 2010-09-04 LAB — CK: Total CK: 37 U/L (ref 7–177)

## 2010-09-04 LAB — CARDIAC PANEL(CRET KIN+CKTOT+MB+TROPI)
CK, MB: 0.7 ng/mL (ref 0.3–4.0)
Total CK: 25 U/L (ref 7–177)

## 2010-09-04 LAB — RAPID URINE DRUG SCREEN, HOSP PERFORMED
Barbiturates: NOT DETECTED
Benzodiazepines: POSITIVE — AB
Cocaine: NOT DETECTED

## 2010-09-04 LAB — TROPONIN I: Troponin I: 0.03 ng/mL (ref 0.00–0.06)

## 2010-09-04 LAB — CK TOTAL AND CKMB (NOT AT ARMC): CK, MB: 0.7 ng/mL (ref 0.3–4.0)

## 2010-09-05 LAB — GLUCOSE, CAPILLARY

## 2010-09-05 LAB — CBC
HCT: 33.7 % — ABNORMAL LOW (ref 36.0–46.0)
MCHC: 32 g/dL (ref 30.0–36.0)
RDW: 15.2 % (ref 11.5–15.5)
WBC: 7.2 10*3/uL (ref 4.0–10.5)

## 2010-09-05 LAB — LIPID PANEL
Cholesterol: 184 mg/dL (ref 0–200)
LDL Cholesterol: 107 mg/dL — ABNORMAL HIGH (ref 0–99)
Total CHOL/HDL Ratio: 2.7 RATIO

## 2010-09-05 LAB — CARDIAC PANEL(CRET KIN+CKTOT+MB+TROPI): CK, MB: 0.6 ng/mL (ref 0.3–4.0)

## 2010-09-05 LAB — HEPARIN LEVEL (UNFRACTIONATED): Heparin Unfractionated: 0.43 IU/mL (ref 0.30–0.70)

## 2010-09-07 NOTE — Progress Notes (Signed)
  Phone Note Refill Request   Refills Requested: Medication #1:  AZITHROMYCIN 250 MG TABS 2 tabs by mouth today PT SAYS PHARMACY DID NOT RECIEVE THIS MED  Initial call taken by: Armenia Shannon,  August 26, 2010 3:53 PM  Follow-up for Phone Call        [facility] and spoke w/Tamika -- She stated that the pt's insurance will not pay for her Azithromycin until 08/31/10 -- Maybe b/c it was too early or b/c maybe she already p/u the same Rx this month?? -- Called pt. and notified her -- She told me they told her that Medicaid will only pay for so many Medicines per month and that she has not p/u this Rx anywhere else?? -- She just wanted to make sure this wouldn't make her Sinuses worse. -- Adv. pt. that she will be ok but if she feels the need to see a provider for any reason to call us. -- Till then, she is to p/u her Azithromycin on 08/31/10 -- Hale Drone CMA  August 29, 2010 4:39 PM   Additional Follow-up for Phone Call Additional follow up Details #1::        Merdis Delay MD  August 31, 2010 5:30 AM

## 2010-09-10 ENCOUNTER — Telehealth (INDEPENDENT_AMBULATORY_CARE_PROVIDER_SITE_OTHER): Payer: Self-pay | Admitting: Internal Medicine

## 2010-09-14 NOTE — Discharge Summary (Signed)
  NAME:  [name] [name], [name] [name]                    ACCOUNT NO.:  [id number]  MEDICAL RECORD NO.:  [id number]           PATIENT TYPE:  I  LOCATION:  2030                         FACILITY:  [facility]  PHYSICIAN:  Corky Crafts, MDDATE OF BIRTH:  April 15, 1966  DATE OF ADMISSION:  09/04/2010 DATE OF DISCHARGE:  09/05/2010                              DISCHARGE SUMMARY   DISCHARGE DIAGNOSES: 1. Coronary artery disease. 2. Atypical chest pain. 3. Diabetes. 4. Tobacco abuse.  PROCEDURE PERFORMED:  None.  HOSPITAL COURSE:  The patient was admitted after having chest discomfort.  She describes the discomfort as an indigestion.  She does admit that she has been eating quite poorly.  She was in the process of moving and has been eating a lot of fast food and potato chips.  She has also been smoking more than usual.  She noted one episode where she ate a [employer] hamburger and had severe heartburn after this.  The thing that alarmed her the most and made her come to the hospital was the fact that she had some jaw pain.  Her symptoms were still somewhat different from her prior MI.  She did not have any lightheadedness or syncope. She had jaw pain nearly all day before coming into the hospital.  Her troponins were negative x3.  She was on heparin.  Her hemoglobin dropped from 12.6 to 10.8.  She reported that she had just sent in stool guaiac cards to her primary care physician and had not yet heard the results. She did not show any signs of overt bleeding.  On the day of discharge, her heparin was stopped.  She ambulated in the halls and had no discomfort in her chest.  She felt well and requested several times that she wanted to go home.  We discussed various options for workup, and she did not want an invasive workup done.  She was agreeable to an outpatient stress test.  DISCHARGE MEDICATIONS: 1. Aspirin 81 mg daily. 2. Protonix 40 mg daily. 3. Iron 325 mg daily. 4. Folic acid 1 mg daily. 5.  Lantus 40 units in the morning. 6. Lisinopril 5 mg. 7. Metoprolol 12.5 mg b.i.d. 8. Methotrexate 15 mg every Wednesday. 9. NovoLog sliding scale. 10.Sublingual nitroglycerin p.r.n. 11.Plavix 75 mg daily. 12.Simvastatin 40 mg daily. 13.Ultram 50 mg b.i.d. p.r.n. 14.Zoloft 100 mg daily  She is to stop taking omeprazole because of the possible interaction with Plavix.  This will be substituted with Protonix, a prescription was given.  FOLLOWUP ACTIVITY:  As tolerated.  DIET:  Low-sodium, heart-healthy diet.  APPOINTMENTS:  With Dr. Eldridge Dace, the office will call to set up her stress test appointment.  She is also to stop smoking.  Greater than 30 minutes was spent on this discharge going over medication changes.     Corky Crafts, MD     JSV/MEDQ  D:  09/05/2010  T:  09/05/2010  Job:  [id number]  Electronically Signed by Lance Muss MD on 09/14/2010 09:40:27 AM

## 2010-09-21 NOTE — Progress Notes (Addendum)
Summary: Lab results  Phone Note Outgoing Call   Summary of Call: Tried to call pt. to get an update, but no one answers phone and no voicemail.   Please try again on Monday--just want to make sure she received abx and is feeling better. Please schedule her for repeat FLP and liver enzymes when she comes in for her every 6 week CMET and CBC, which I know she scheduled when leaving from last appt.  Let her know her pap and labs were othewise okay. Initial call taken by: Julieanne Manson MD,  September 10, 2010 4:48 PM  Follow-up for Phone Call        [phone] No answer, no voice mail. [phone] # D/C Gaylyn Cheers RN  September 13, 2010 1:04 PM   Additional Follow-up for Phone Call Additional follow up Details #1::        Wrote note to pharmacy with med refill--Tramadol to have pt. call--she apparently moved recently by discharge note now in chart. Additional Follow-up by: Julieanne Manson MD,  September 16, 2010 8:07 AM

## 2010-09-21 NOTE — H&P (Signed)
NAME:  [name] [name], [name] [name]                    ACCOUNT NO.:  [id number]  MEDICAL RECORD NO.:  [id number]           PATIENT TYPE:  I  LOCATION:  2030                         FACILITY:  [facility]  PHYSICIAN:  Bevelyn Buckles. Danae Oland, MDDATE OF BIRTH:  [birth year]  DATE OF ADMISSION:  09/04/2010 DATE OF DISCHARGE:                             HISTORY & PHYSICAL   PRIMARY CARDIOLOGIST:  Corky Crafts, MD  REASON FOR ADMISSION:  [name] is a 45 year old female, with previously documented coronary artery disease, status post acute inferior ST- elevation myocardial infarction, in June 2010, treated with emergent bare metal stenting of the distal right coronary artery.  Her presentation was complicated by complete heart block, requiring a temporary pacemaker, and residual anatomy notable for diffuse left circumflex artery disease.  Ejection fraction calculated at 50%, with mid inferior hypokinesis.  The patient then presented nearly 1 month later, again with inferior STEMI,  this time felt to be secondary to RCA spasm, with previously placed stents  found to be widely patent.  There was continued diffuse left circumflex disease,  with no appreciable change.  The patient states that she has been compliant with all of her medications, although she continues to smoke, as well as scheduled followups with Dr. Eldridge Dace.  She also states that she had been doing well, since her last catheterization, in July 2010.  Over this past week, however, while in the midst of relocating from her current apartment to another, and experiencing significant stress related to her husband, from whom she is trying to separate, she has developed some exertional dyspnea, and recent recurrent jaw pain.  She states that the jaw pain is reminiscent of her past presentation.  She awoke at 5:30 this morning, again with right-sided jaw pain, with some associated anterior chest discomfort, diaphoresis, nausea, and radiation to the  left upper extremity.  She rated it an 8/10, but elected to not take any nitroglycerin.  She was driven to the emergency room, by her sister, where she presented with a blood pressure of 115/65, pulse 90, and was afebrile.  She received 4 baby aspirins, Nitro paste, and was placed on intravenous heparin.  ALLERGIES:  CODEINE.  HOME MEDICATIONS: 1. Omeprazole 40 daily. 2. Sertraline 150 daily. 3. NovoLog sliding scale insulin. 4. Lantus 40 units daily. 5. Reglan 10 mg t.i.d. 6. Simvastatin 40 mg daily. 7. Pepcid 20 mg b.i.d. 8. Lisinopril 5 mg daily. 9. Plavix 75 mg daily. 10.Metoprolol tartrate 12.5 mg b.i.d. 11.Trexall 50 mg daily. 12.Folic acid 1 mg daily. 13.Ferrous sulfate 325 mg daily. 14.Azithromycin 250 mg daily.  PAST MEDICAL HISTORY: 1. Coronary artery disease.     a.     As outlined above. 2. IDDM. 3. Dyslipidemia. 4. Tobacco, longstanding. 5. Hypotension. 6. Polysubstance abuse.     a.     Including cocaine, marijuana, and alcohol. 7. GERD. 8. Anxiety/depression.  SOCIAL HISTORY:  The patient lives here in [place], currently with her husband and a daughter.  She continues to smoke approximately 5-10 cigarettes a day, and started smoking at the age of 70.  She is reportedly  clean with respect to both alcohol and cocaine, for 4 years, now.  FAMILY HISTORY:  Negative for coronary artery disease.  REVIEW OF SYSTEMS:  As outlined per HPI.  Remaining systems reviewed, and are negative.  PHYSICAL EXAMINATION:  VITAL SIGNS:  Blood pressure currently 106/66, pulse 80, respirations 18, temperature afebrile, sats 99%. GENERAL:  A 45 year old female, lying supine, no distress. HEENT:  Normocephalic, atraumatic.  PERRLA bilaterally.  EOMI. NECK:  Palpable bilateral carotid pulse without bruits; no JVD at 30 degrees. LUNGS:  Diminished breath sounds, without crackles or wheezes. HEART:  Regular rate and rhythm.  No significant murmurs, no rubs,  no gallops. ABDOMEN:  Soft, nontender, intact bowel sounds. EXTREMITIES:  Palpable bilateral femoral pulses with soft right-sided bruit; brisk dorsalis pedis pulses with no edema. SKIN:  Warm and dry. MUSCULOSKELETAL:  No obvious deformity. NEUROLOGIC:  No focal deficits.  Admission chest x-ray:  No acute changes.  Admission EKG:  Normal sinus rhythm at 88 bpm; normal axis; chronic Q-waves inferiorly, with chronic downsloping ST depression in leads III and aVF.  LABORATORY DATA:  Cardiac markers (POC):  MB less than 1.0, troponin I less than 0.05.  Sodium 138, potassium 4.5, BUN 20, creatinine 0.9, glucose 169.  WBC 9.5, hemoglobin 12, hematocrit 37, platelet 337.  IMPRESSION: 1. Unstable angina pectoris.     a.     Atypical/typical features. 2. Coronary artery disease.     a.     Status post inferior ST myocardial infarction, 6/10,      secondary to 100% distal right coronary artery.     b.     Status post emergent bare metal stenting.     c.     Complicated by complete heart block, requiring temporary      pacemaker.     d.     Ejection fraction 50%.     e.     Widely patent right coronary artery stent, by repeat      catheterization, July 2010. 3. Tobacco, ongoing. 4. Insulin-dependent diabetes mellitus. 5. Dyslipidemia. 6. History of substance abuse.  PLAN:  The patient will be admitted for continued close monitoring and rule out of myocardial infarction with serial cardiac markers.  She will  be maintained on current regimen, which includes aspirin and intravenous heparin, in addition to her home medications, which include Plavix, beta- blocker, and statin.  We will keep her n.p.o. after midnight, and defer to Dr. Eldridge Dace the decision of whether or not to proceed with a repeat cardiac catheterization, versus risk stratification with a stress test. Of note, given her history of polysubstance abuse, a urine drug screen will be also checked.  Smoking cessation will also  be advised.     Gene Serpe, PA-C   ______________________________ Bevelyn Buckles. Starlena Beil, MD    GS/MEDQ  D:  09/04/2010  T:  09/05/2010  Job:  [id number]  Electronically Signed by Rozell Searing PA-C on 09/06/2010 11:12:00 AM Electronically Signed by Arvilla Meres MD on 09/21/2010 01:56:53 PM

## 2010-09-26 ENCOUNTER — Ambulatory Visit (INDEPENDENT_AMBULATORY_CARE_PROVIDER_SITE_OTHER): Payer: Medicaid Other

## 2010-09-26 ENCOUNTER — Inpatient Hospital Stay (INDEPENDENT_AMBULATORY_CARE_PROVIDER_SITE_OTHER)
Admission: RE | Admit: 2010-09-26 | Discharge: 2010-09-26 | Disposition: A | Discharge status: Home / Self Care | Payer: Medicaid Other | Source: Ambulatory Visit | Attending: Family Medicine | Admitting: Family Medicine

## 2010-09-26 DIAGNOSIS — M25519 Pain in unspecified shoulder: Secondary | ICD-10-CM

## 2010-09-26 DIAGNOSIS — M940 Chondrocostal junction syndrome [Tietze]: Secondary | ICD-10-CM

## 2010-09-28 ENCOUNTER — Emergency Department (HOSPITAL_COMMUNITY)
Admission: EM | Admit: 2010-09-28 | Discharge: 2010-09-28 | Disposition: A | Discharge status: Home / Self Care | Payer: Medicaid Other | Attending: Emergency Medicine | Admitting: Emergency Medicine

## 2010-09-28 ENCOUNTER — Emergency Department (HOSPITAL_COMMUNITY): Payer: Medicaid Other

## 2010-09-28 ENCOUNTER — Encounter (HOSPITAL_COMMUNITY): Payer: Self-pay | Admitting: Radiology

## 2010-09-28 DIAGNOSIS — R10811 Right upper quadrant abdominal tenderness: Secondary | ICD-10-CM | POA: Insufficient documentation

## 2010-09-28 DIAGNOSIS — I2510 Atherosclerotic heart disease of native coronary artery without angina pectoris: Secondary | ICD-10-CM | POA: Insufficient documentation

## 2010-09-28 DIAGNOSIS — R918 Other nonspecific abnormal finding of lung field: Secondary | ICD-10-CM | POA: Insufficient documentation

## 2010-09-28 DIAGNOSIS — R109 Unspecified abdominal pain: Secondary | ICD-10-CM | POA: Insufficient documentation

## 2010-09-28 DIAGNOSIS — I252 Old myocardial infarction: Secondary | ICD-10-CM | POA: Insufficient documentation

## 2010-09-28 DIAGNOSIS — F341 Dysthymic disorder: Secondary | ICD-10-CM | POA: Insufficient documentation

## 2010-09-28 DIAGNOSIS — Z79899 Other long term (current) drug therapy: Secondary | ICD-10-CM | POA: Insufficient documentation

## 2010-09-28 DIAGNOSIS — E119 Type 2 diabetes mellitus without complications: Secondary | ICD-10-CM | POA: Insufficient documentation

## 2010-09-28 DIAGNOSIS — M129 Arthropathy, unspecified: Secondary | ICD-10-CM | POA: Insufficient documentation

## 2010-09-28 DIAGNOSIS — R110 Nausea: Secondary | ICD-10-CM | POA: Insufficient documentation

## 2010-09-28 HISTORY — DX: Essential (primary) hypertension: I10

## 2010-09-28 LAB — COMPREHENSIVE METABOLIC PANEL
ALT: <8 U/L (ref 0–35)
Alkaline Phosphatase: 121 U/L — ABNORMAL HIGH (ref 39–117)
BUN: 12 mg/dL (ref 6–23)
CO2: 25 mEq/L (ref 19–32)
GFR calc non Af Amer: 58 mL/min — ABNORMAL LOW (ref >60)
Glucose, Bld: 179 mg/dL — ABNORMAL HIGH (ref 70–99)
Potassium: 4.7 mEq/L (ref 3.5–5.1)
Sodium: 135 mEq/L (ref 135–145)
Total Bilirubin: 0.3 mg/dL (ref 0.3–1.2)

## 2010-09-28 LAB — CBC
HCT: 35 % — ABNORMAL LOW (ref 36.0–46.0)
Hemoglobin: 11.6 g/dL — ABNORMAL LOW (ref 12.0–15.0)
MCV: 89.1 fL (ref 78.0–100.0)
WBC: 9.5 10*3/uL (ref 4.0–10.5)

## 2010-09-28 LAB — URINALYSIS, ROUTINE W REFLEX MICROSCOPIC
Ketones, ur: NEGATIVE mg/dL
Leukocytes, UA: NEGATIVE
Protein, ur: 30 mg/dL — AB
Urobilinogen, UA: 1 mg/dL (ref 0.0–1.0)

## 2010-09-28 LAB — DIFFERENTIAL
Basophils Absolute: 0 10*3/uL (ref 0.0–0.1)
Lymphocytes Relative: 21 % (ref 12–46)
Lymphs Abs: 2 10*3/uL (ref 0.7–4.0)
Monocytes Absolute: 0.9 10*3/uL (ref 0.1–1.0)
Neutro Abs: 6.4 10*3/uL (ref 1.7–7.7)

## 2010-09-28 LAB — URINE MICROSCOPIC-ADD ON

## 2010-09-28 LAB — LIPASE, BLOOD: Lipase: 17 U/L (ref 11–59)

## 2010-09-28 MED ORDER — IOHEXOL 300 MG/ML  SOLN
80.0000 mL | Freq: Once | INTRAMUSCULAR | Status: AC | PRN
  Administered 2010-09-28: 80 mL via INTRAVENOUS

## 2010-09-30 IMAGING — CR DG WRIST COMPLETE 3+V*R*
4 series · 4 of 4 positions shown · non-contrast
Comparison: 09/24/2009 its hand films.

CLINICAL DATA: Fall in [REDACTED].  Chronic right-sided wrist pain ever
since.

RIGHT WRIST - COMPLETE 3+ VIEW

[x wrist pa right]
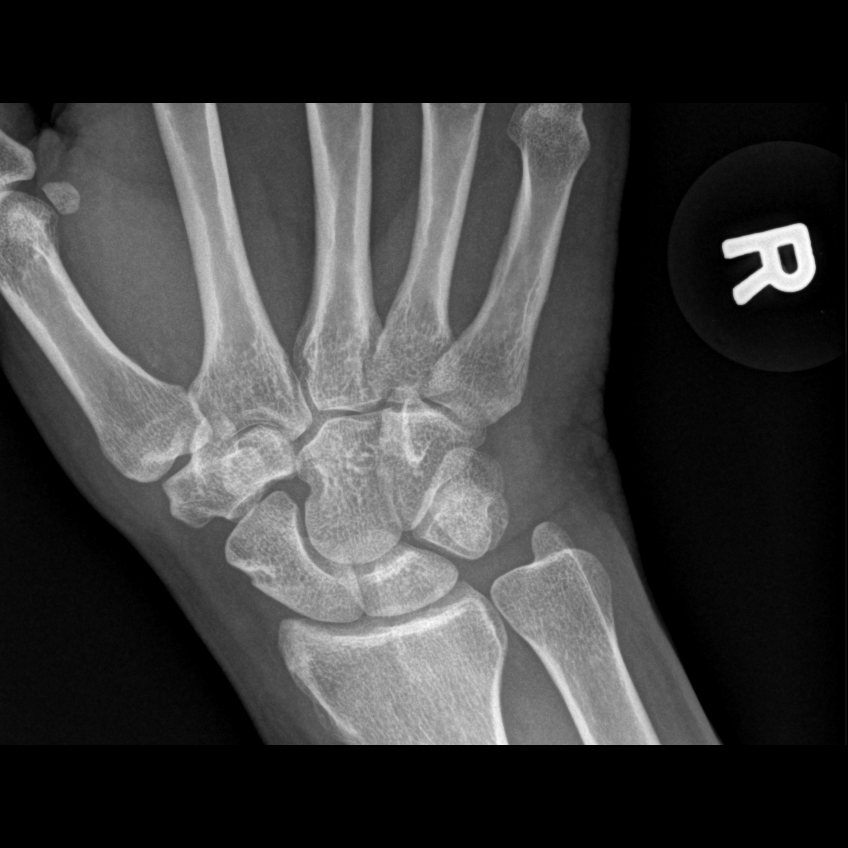

[x wrist obl right]
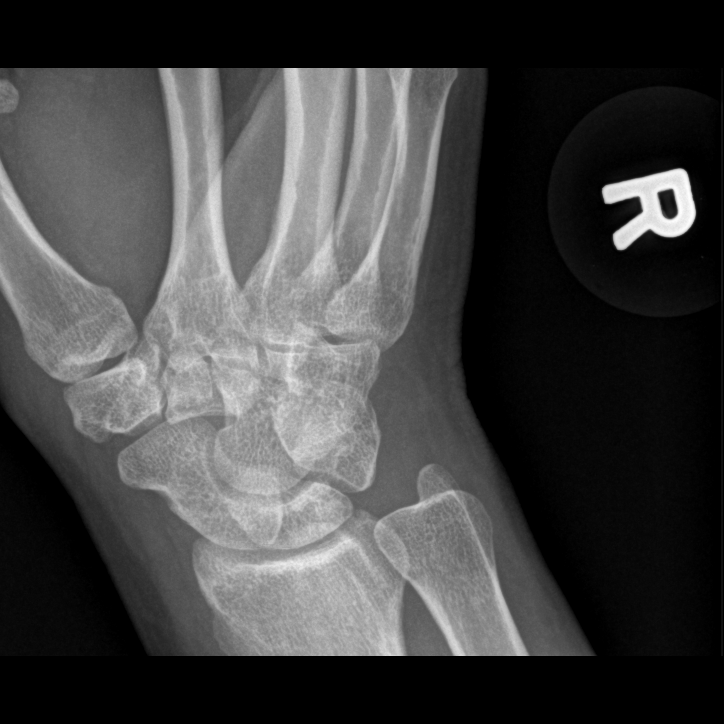

[x wrist lat right]
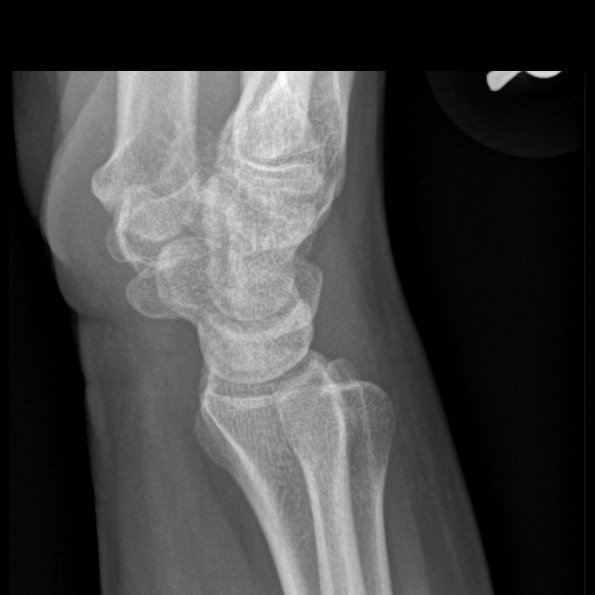

[x navicular]
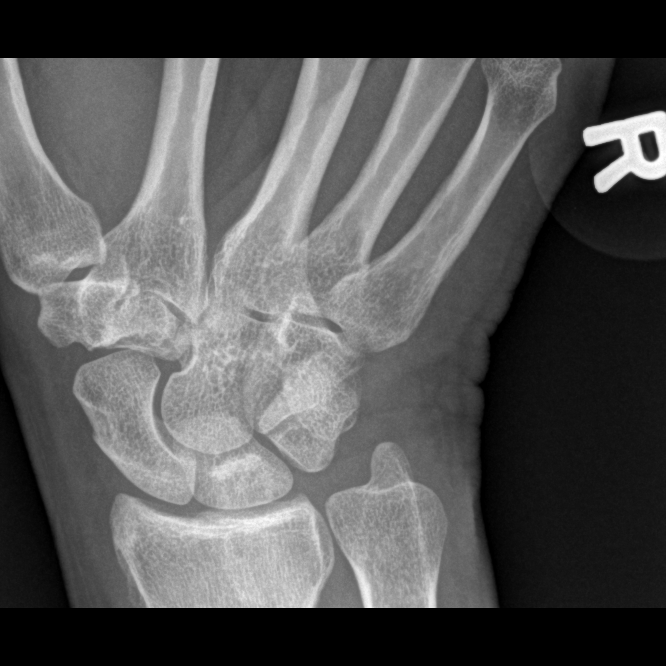

[4 of 4 positions shown; findings below may reference images not displayed]

FINDINGS: No acute fracture or dislocation.  Joint spaces
maintained.  No focal osseous lesion.
IMPRESSION: No acute findings about the right wrist.

## 2010-10-01 ENCOUNTER — Emergency Department (HOSPITAL_COMMUNITY)
Admission: EM | Admit: 2010-10-01 | Discharge: 2010-10-02 | Disposition: A | Discharge status: Home / Self Care | Payer: Medicaid Other | Attending: Emergency Medicine | Admitting: Emergency Medicine

## 2010-10-01 ENCOUNTER — Emergency Department (HOSPITAL_COMMUNITY): Payer: Medicaid Other

## 2010-10-01 DIAGNOSIS — R609 Edema, unspecified: Secondary | ICD-10-CM | POA: Insufficient documentation

## 2010-10-01 DIAGNOSIS — E860 Dehydration: Secondary | ICD-10-CM | POA: Insufficient documentation

## 2010-10-01 DIAGNOSIS — N289 Disorder of kidney and ureter, unspecified: Secondary | ICD-10-CM | POA: Insufficient documentation

## 2010-10-01 DIAGNOSIS — I252 Old myocardial infarction: Secondary | ICD-10-CM | POA: Insufficient documentation

## 2010-10-01 DIAGNOSIS — M129 Arthropathy, unspecified: Secondary | ICD-10-CM | POA: Insufficient documentation

## 2010-10-01 DIAGNOSIS — I2510 Atherosclerotic heart disease of native coronary artery without angina pectoris: Secondary | ICD-10-CM | POA: Insufficient documentation

## 2010-10-01 DIAGNOSIS — E119 Type 2 diabetes mellitus without complications: Secondary | ICD-10-CM | POA: Insufficient documentation

## 2010-10-01 DIAGNOSIS — F341 Dysthymic disorder: Secondary | ICD-10-CM | POA: Insufficient documentation

## 2010-10-01 LAB — CBC
HCT: 33.7 % — ABNORMAL LOW (ref 36.0–46.0)
Hemoglobin: 11.2 g/dL — ABNORMAL LOW (ref 12.0–15.0)
MCH: 29.3 pg (ref 26.0–34.0)
MCHC: 33.2 g/dL (ref 30.0–36.0)
RDW: 15.2 % (ref 11.5–15.5)

## 2010-10-01 LAB — DIFFERENTIAL
Basophils Absolute: 0 10*3/uL (ref 0.0–0.1)
Basophils Relative: 0 % (ref 0–1)
Eosinophils Relative: 1 % (ref 0–5)
Lymphocytes Relative: 20 % (ref 12–46)
Monocytes Absolute: 0.3 10*3/uL (ref 0.1–1.0)
Monocytes Relative: 3 % (ref 3–12)

## 2010-10-01 LAB — COMPREHENSIVE METABOLIC PANEL
ALT: 9 U/L (ref 0–35)
CO2: 21 mEq/L (ref 19–32)
Calcium: 9 mg/dL (ref 8.4–10.5)
Creatinine, Ser: 1.36 mg/dL — ABNORMAL HIGH (ref 0.4–1.2)
GFR calc non Af Amer: 42 mL/min — ABNORMAL LOW (ref >60)
Glucose, Bld: 87 mg/dL (ref 70–99)
Sodium: 137 mEq/L (ref 135–145)
Total Protein: 7 g/dL (ref 6.0–8.3)

## 2010-10-03 ENCOUNTER — Telehealth (INDEPENDENT_AMBULATORY_CARE_PROVIDER_SITE_OTHER): Payer: Self-pay | Admitting: Internal Medicine

## 2010-10-06 ENCOUNTER — Encounter (INDEPENDENT_AMBULATORY_CARE_PROVIDER_SITE_OTHER): Payer: Self-pay | Admitting: Internal Medicine

## 2010-10-11 NOTE — Letter (Signed)
Summary: [facility]//OFFICE VISIT  [facility]//OFFICE VISIT   Imported By: Arta Bruce 10/03/2010 11:35:36  _____________________________________________________________________  External Attachment:    Type:   Image     Comment:   External Document

## 2010-10-13 LAB — CONVERTED CEMR LAB
Alkaline Phosphatase: 116 units/L (ref 39–117)
BUN: 27 mg/dL — ABNORMAL HIGH (ref 6–23)
CO2: 18 meq/L — ABNORMAL LOW (ref 19–32)
Eosinophils Absolute: 0.2 10*3/uL (ref 0.0–0.7)
Eosinophils Relative: 2 % (ref 0–5)
Glucose, Bld: 55 mg/dL — ABNORMAL LOW (ref 70–99)
HCT: 36.3 % (ref 36.0–46.0)
Hemoglobin: 11.6 g/dL — ABNORMAL LOW (ref 12.0–15.0)
Lymphocytes Relative: 21 % (ref 12–46)
Lymphs Abs: 2 10*3/uL (ref 0.7–4.0)
MCV: 90.8 fL (ref 78.0–100.0)
Monocytes Absolute: 0.7 10*3/uL (ref 0.1–1.0)
Monocytes Relative: 7 % (ref 3–12)
Platelets: 369 10*3/uL (ref 150–400)
RBC: 4 M/uL (ref 3.87–5.11)
Sodium: 138 meq/L (ref 135–145)
Total Bilirubin: 0.3 mg/dL (ref 0.3–1.2)
Total Protein: 6.8 g/dL (ref 6.0–8.3)
WBC: 9.3 10*3/uL (ref 4.0–10.5)

## 2010-10-17 ENCOUNTER — Telehealth (INDEPENDENT_AMBULATORY_CARE_PROVIDER_SITE_OTHER): Payer: Self-pay | Admitting: Internal Medicine

## 2010-10-19 LAB — GLUCOSE, CAPILLARY
Glucose-Capillary: 108 mg/dL — ABNORMAL HIGH (ref 70–99)
Glucose-Capillary: 142 mg/dL — ABNORMAL HIGH (ref 70–99)
Glucose-Capillary: 153 mg/dL — ABNORMAL HIGH (ref 70–99)
Glucose-Capillary: 168 mg/dL — ABNORMAL HIGH (ref 70–99)
Glucose-Capillary: 182 mg/dL — ABNORMAL HIGH (ref 70–99)
Glucose-Capillary: 201 mg/dL — ABNORMAL HIGH (ref 70–99)
Glucose-Capillary: 224 mg/dL — ABNORMAL HIGH (ref 70–99)
Glucose-Capillary: 227 mg/dL — ABNORMAL HIGH (ref 70–99)
Glucose-Capillary: 240 mg/dL — ABNORMAL HIGH (ref 70–99)
Glucose-Capillary: 262 mg/dL — ABNORMAL HIGH (ref 70–99)
Glucose-Capillary: 293 mg/dL — ABNORMAL HIGH (ref 70–99)
Glucose-Capillary: 308 mg/dL — ABNORMAL HIGH (ref 70–99)
Glucose-Capillary: 340 mg/dL — ABNORMAL HIGH (ref 70–99)
Glucose-Capillary: 399 mg/dL — ABNORMAL HIGH (ref 70–99)
Glucose-Capillary: 438 mg/dL — ABNORMAL HIGH (ref 70–99)
Glucose-Capillary: 47 mg/dL — ABNORMAL LOW (ref 70–99)
Glucose-Capillary: 53 mg/dL — ABNORMAL LOW (ref 70–99)

## 2010-10-19 LAB — BASIC METABOLIC PANEL
BUN: 19 mg/dL (ref 6–23)
BUN: 3 mg/dL — ABNORMAL LOW (ref 6–23)
BUN: 33 mg/dL — ABNORMAL HIGH (ref 6–23)
BUN: 7 mg/dL (ref 6–23)
CO2: 17 mEq/L — ABNORMAL LOW (ref 19–32)
CO2: 17 mEq/L — ABNORMAL LOW (ref 19–32)
CO2: 19 mEq/L (ref 19–32)
CO2: 20 mEq/L (ref 19–32)
CO2: 21 mEq/L (ref 19–32)
CO2: 22 mEq/L (ref 19–32)
CO2: 23 mEq/L (ref 19–32)
Calcium: 7.4 mg/dL — ABNORMAL LOW (ref 8.4–10.5)
Calcium: 7.7 mg/dL — ABNORMAL LOW (ref 8.4–10.5)
Calcium: 7.7 mg/dL — ABNORMAL LOW (ref 8.4–10.5)
Calcium: 8.2 mg/dL — ABNORMAL LOW (ref 8.4–10.5)
Calcium: 8.7 mg/dL (ref 8.4–10.5)
Chloride: 107 mEq/L (ref 96–112)
Chloride: 108 mEq/L (ref 96–112)
Chloride: 113 mEq/L — ABNORMAL HIGH (ref 96–112)
Creatinine, Ser: 0.62 mg/dL (ref 0.4–1.2)
Creatinine, Ser: 0.79 mg/dL (ref 0.4–1.2)
Creatinine, Ser: 0.87 mg/dL (ref 0.4–1.2)
Creatinine, Ser: 1 mg/dL (ref 0.4–1.2)
Creatinine, Ser: 1.05 mg/dL (ref 0.4–1.2)
Creatinine, Ser: 1.18 mg/dL (ref 0.4–1.2)
Creatinine, Ser: 2.11 mg/dL — ABNORMAL HIGH (ref 0.4–1.2)
GFR calc Af Amer: 42 mL/min — ABNORMAL LOW (ref >60)
GFR calc Af Amer: >60 mL/min (ref >60)
GFR calc Af Amer: >60 mL/min (ref >60)
GFR calc Af Amer: >60 mL/min (ref >60)
GFR calc non Af Amer: 35 mL/min — ABNORMAL LOW (ref >60)
GFR calc non Af Amer: 56 mL/min — ABNORMAL LOW (ref >60)
GFR calc non Af Amer: 57 mL/min — ABNORMAL LOW (ref >60)
GFR calc non Af Amer: >60 mL/min (ref >60)
GFR calc non Af Amer: >60 mL/min (ref >60)
Glucose, Bld: 104 mg/dL — ABNORMAL HIGH (ref 70–99)
Glucose, Bld: 124 mg/dL — ABNORMAL HIGH (ref 70–99)
Glucose, Bld: 137 mg/dL — ABNORMAL HIGH (ref 70–99)
Glucose, Bld: 236 mg/dL — ABNORMAL HIGH (ref 70–99)
Glucose, Bld: 306 mg/dL — ABNORMAL HIGH (ref 70–99)
Glucose, Bld: 521 mg/dL — ABNORMAL HIGH (ref 70–99)
Potassium: 4.2 mEq/L (ref 3.5–5.1)
Potassium: 4.5 mEq/L (ref 3.5–5.1)
Potassium: 4.8 mEq/L (ref 3.5–5.1)
Potassium: 5.2 mEq/L — ABNORMAL HIGH (ref 3.5–5.1)
Sodium: 132 mEq/L — ABNORMAL LOW (ref 135–145)
Sodium: 134 mEq/L — ABNORMAL LOW (ref 135–145)
Sodium: 135 mEq/L (ref 135–145)
Sodium: 138 mEq/L (ref 135–145)

## 2010-10-19 LAB — CBC
HCT: 34.2 % — ABNORMAL LOW (ref 36.0–46.0)
HCT: 34.7 % — ABNORMAL LOW (ref 36.0–46.0)
Hemoglobin: 10.4 g/dL — ABNORMAL LOW (ref 12.0–15.0)
MCHC: 32.5 g/dL (ref 30.0–36.0)
MCHC: 32.8 g/dL (ref 30.0–36.0)
MCHC: 32.9 g/dL (ref 30.0–36.0)
MCV: 79.6 fL (ref 78.0–100.0)
MCV: 79.9 fL (ref 78.0–100.0)
Platelets: 373 10*3/uL (ref 150–400)
Platelets: 374 10*3/uL (ref 150–400)
Platelets: 424 10*3/uL — ABNORMAL HIGH (ref 150–400)
RBC: 4.3 MIL/uL (ref 3.87–5.11)
RBC: 4.33 MIL/uL (ref 3.87–5.11)
RDW: 19.3 % — ABNORMAL HIGH (ref 11.5–15.5)
RDW: 19.4 % — ABNORMAL HIGH (ref 11.5–15.5)
WBC: 12.9 10*3/uL — ABNORMAL HIGH (ref 4.0–10.5)

## 2010-10-19 LAB — DIFFERENTIAL
Basophils Relative: 0 % (ref 0–1)
Eosinophils Absolute: 0 10*3/uL (ref 0.0–0.7)
Eosinophils Relative: 0 % (ref 0–5)
Monocytes Absolute: 0.5 10*3/uL (ref 0.1–1.0)
Monocytes Relative: 4 % (ref 3–12)

## 2010-10-19 LAB — POCT I-STAT 3, ART BLOOD GAS (G3+)
Bicarbonate: 16.3 mEq/L — ABNORMAL LOW (ref 20.0–24.0)
TCO2: 17 mmol/L (ref 0–100)
pO2, Arterial: 67 mmHg — ABNORMAL LOW (ref 80.0–100.0)

## 2010-10-19 LAB — DRUGS OF ABUSE SCREEN W/O ALC, ROUTINE URINE
Barbiturate Quant, Ur: NEGATIVE
Cocaine Metabolites: NEGATIVE
Creatinine,U: 22 mg/dL
Methadone: NEGATIVE
Phencyclidine (PCP): NEGATIVE

## 2010-10-19 LAB — COMPREHENSIVE METABOLIC PANEL
ALT: <8 U/L (ref 0–35)
AST: 23 U/L (ref 0–37)
Albumin: 3.4 g/dL — ABNORMAL LOW (ref 3.5–5.2)
Alkaline Phosphatase: 134 U/L — ABNORMAL HIGH (ref 39–117)
Potassium: 7.4 mEq/L (ref 3.5–5.1)
Sodium: 130 mEq/L — ABNORMAL LOW (ref 135–145)
Total Protein: 8.5 g/dL — ABNORMAL HIGH (ref 6.0–8.3)

## 2010-10-19 LAB — URINALYSIS, ROUTINE W REFLEX MICROSCOPIC
Ketones, ur: 15 mg/dL — AB
Leukocytes, UA: NEGATIVE
Nitrite: NEGATIVE
Protein, ur: 30 mg/dL — AB
pH: 5 (ref 5.0–8.0)

## 2010-10-19 LAB — URINE CULTURE: Culture: NO GROWTH

## 2010-10-19 LAB — CARDIAC PANEL(CRET KIN+CKTOT+MB+TROPI)
CK, MB: 1.3 ng/mL (ref 0.3–4.0)
Relative Index: INVALID (ref 0.0–2.5)
Total CK: 37 U/L (ref 7–177)
Troponin I: 0.06 ng/mL (ref 0.00–0.06)
Troponin I: 0.07 ng/mL — ABNORMAL HIGH (ref 0.00–0.06)

## 2010-10-19 LAB — URINE MICROSCOPIC-ADD ON

## 2010-10-19 LAB — HEMOCCULT GUIAC POC 1CARD (OFFICE): Fecal Occult Bld: POSITIVE

## 2010-10-21 LAB — COMPREHENSIVE METABOLIC PANEL
ALT: 8 U/L (ref 0–35)
AST: 17 U/L (ref 0–37)
Albumin: 2.8 g/dL — ABNORMAL LOW (ref 3.5–5.2)
CO2: 22 mEq/L (ref 19–32)
Calcium: 9 mg/dL (ref 8.4–10.5)
Creatinine, Ser: 0.97 mg/dL (ref 0.4–1.2)
GFR calc Af Amer: >60 mL/min (ref >60)
GFR calc non Af Amer: >60 mL/min (ref >60)
Sodium: 135 mEq/L (ref 135–145)
Total Protein: 7.2 g/dL (ref 6.0–8.3)

## 2010-10-21 LAB — GC/CHLAMYDIA PROBE AMP, GENITAL
Chlamydia, DNA Probe: NEGATIVE
GC Probe Amp, Genital: NEGATIVE

## 2010-10-21 LAB — CBC
MCHC: 32.7 g/dL (ref 30.0–36.0)
MCHC: 33.2 g/dL (ref 30.0–36.0)
MCV: 84 fL (ref 78.0–100.0)
Platelets: 401 10*3/uL — ABNORMAL HIGH (ref 150–400)
Platelets: 412 10*3/uL — ABNORMAL HIGH (ref 150–400)
RDW: 16.2 % — ABNORMAL HIGH (ref 11.5–15.5)
RDW: 16.8 % — ABNORMAL HIGH (ref 11.5–15.5)

## 2010-10-21 LAB — WET PREP, GENITAL

## 2010-10-21 LAB — URINALYSIS, ROUTINE W REFLEX MICROSCOPIC
Glucose, UA: 250 mg/dL — AB
Leukocytes, UA: NEGATIVE
Nitrite: NEGATIVE
Specific Gravity, Urine: 1.016 (ref 1.005–1.030)
pH: 6 (ref 5.0–8.0)

## 2010-10-21 LAB — URINE MICROSCOPIC-ADD ON

## 2010-10-21 LAB — DIFFERENTIAL
Basophils Absolute: 0 10*3/uL (ref 0.0–0.1)
Basophils Relative: 0 % (ref 0–1)
Eosinophils Absolute: 0.2 10*3/uL (ref 0.0–0.7)
Eosinophils Relative: 2 % (ref 0–5)
Lymphocytes Relative: 19 % (ref 12–46)
Lymphs Abs: 1.7 10*3/uL (ref 0.7–4.0)
Monocytes Relative: 8 % (ref 3–12)
Neutro Abs: 11.9 10*3/uL — ABNORMAL HIGH (ref 1.7–7.7)
Neutrophils Relative %: 82 % — ABNORMAL HIGH (ref 43–77)

## 2010-10-21 LAB — POCT I-STAT, CHEM 8
Creatinine, Ser: 1 mg/dL (ref 0.4–1.2)
HCT: 40 % (ref 36.0–46.0)
Hemoglobin: 13.6 g/dL (ref 12.0–15.0)
Potassium: 4.5 mEq/L (ref 3.5–5.1)
Sodium: 133 mEq/L — ABNORMAL LOW (ref 135–145)
TCO2: 23 mmol/L (ref 0–100)

## 2010-10-21 LAB — PROTIME-INR: Prothrombin Time: 12.2 seconds (ref 11.6–15.2)

## 2010-10-21 LAB — POCT CARDIAC MARKERS
CKMB, poc: <1 ng/mL — ABNORMAL LOW (ref 1.0–8.0)
Myoglobin, poc: 66.6 ng/mL (ref 12–200)

## 2010-10-21 LAB — GLUCOSE, CAPILLARY: Glucose-Capillary: 238 mg/dL — ABNORMAL HIGH (ref 70–99)

## 2010-10-27 ENCOUNTER — Encounter (INDEPENDENT_AMBULATORY_CARE_PROVIDER_SITE_OTHER): Payer: Self-pay | Admitting: Internal Medicine

## 2010-10-27 ENCOUNTER — Encounter: Payer: Self-pay | Admitting: Internal Medicine

## 2010-10-27 DIAGNOSIS — R109 Unspecified abdominal pain: Secondary | ICD-10-CM | POA: Insufficient documentation

## 2010-10-27 NOTE — Progress Notes (Signed)
Phone Note Outgoing Call   Call placed by: Gerilyn Nestle Call placed to: Patient Summary of Call: Dr. Delrae Alfred -- Called pt. at new number -- Pt. has been to the [facility] x4 since last OV -- she was admitted on 09/01/10 for chest discomfort. I believe you already saw the D/C summary -- She went back on 09/26/10 for left shoulder pain which she is still in a lot of pain. Her pain is a "10" on a scale 1-10. I sched. her an appt. for 10/13/10 to have a f/u w/you and some lab work that she needs every 6 weeks. -- Pt. wants to know if you can see her earlier?? Told her I would relay this message to you -- She went back to ER on 09/28/10 and 10/01/10 for abd. pain. -- She did not finish her Azithromycin that you prescribed back in 1/12 -- She only took 5 tabs and then started to feel sick and that's when she went to the hosp. -- She sounded a little bit confused and dazed -- Says it's the percocet that is making her feel "disoriented" -- She also said that the hosp. prescribed her more Azithromycin and she finished it yesterday -- They gave her 6 tabs. -- What do you recommend?? -- Hale Drone CMA  October 03, 2010 11:01 AM   Follow-up for Phone Call        If we have a cancellation, see if can work in earlier. Is the pain her rheumatoid arthritis pain or something else? Is she using drugs other than what is prescribed? If the Percocet is making her confused, then she is taking too much--please have her cut her dose and frequency of taking down. Follow-up by: Julieanne Manson MD,  October 05, 2010 11:08 AM  Additional Follow-up for Phone Call Additional follow up Details #1::        Dr. Delrae Alfred... pt. has an appt. w/you tomorrow (10/12/10 @ 4pm)... She did say she was taking Tramadol 50mg  and Percocet together and maybe that was making her feel confused/dazed... Now only taking percocet and feeling fine and will need refill on it.... Hale Drone CMA  October 12, 2010 3:45 PM     Additional Follow-up for Phone Call Additional  follow up Details #2::    I would prefer she just take the Tramadol, not the Percocet for as needed pain meds. Her joint pain and Rheumatoid arthritis was previously under control--is she not following up with Rheum as she is supposed to?  When is her next appt. with them?  Pin her down--she often will not give specific answers and I find out she is not following up as recommended.  I am assuming her appt. is actually on the 15th and can discuss much of this then. Please also make sure she is drinking lots of water--her labs last week suggest she is not.   Julieanne Manson MD  October 13, 2010 3:09 PM   LM on [phone].. Appt. was on the 15th but r/s to 10/27/10  ... Hale Drone CMA  October 14, 2010 4:23 PM   Dr. Delrae Alfred... called pt. and gave her the above info... She is still taking percocet but told her that you prefer for her to take tramadol... Her next appt. w/her rheum. will be in July, it was resched. from May... She last saw her rheum in Nov/Dec... She states she drinks nothing but water... She is still concnered about a lump on her joint that is causing her  pain... She wants to be seen sooner than 10/27/10 as we r/s her orig. appt. she had on 10/13/10.... Hale Drone CMA  October 18, 2010 5:11 PM   Additional Follow-up for Phone Call Additional follow up Details #3:: Details for Additional Follow-up Action Taken: The lump on her finger is chronic.  Is she taking her Methotrexate regularly?  Julieanne Manson MD  October 19, 2010 8:17 AM   She is taking her Methotrexate regularly... Will wait till 10/27/10 to talk to you about her lump on finger.... Hale Drone CMA  October 19, 2010 8:58 AM

## 2010-10-27 NOTE — Progress Notes (Signed)
Summary: lab results   Phone Note Call from Patient Call back at Home Phone [phone]   Reason for Call: Lab or Test Results Summary of Call: Pt said [name] [name] called her Friday but she missed the call.  pls call bk Initial call taken by: Ayesha Rumpf,  October 17, 2010 12:56 PM  Follow-up for Phone Call        Talked to pt. already.... see phone note on 10/03/10... Hale Drone CMA  October 20, 2010 10:07 AM

## 2010-11-01 NOTE — Progress Notes (Signed)
Summary: Office Visit//MEDICINE HEALTH SCREEN  Office Visit//MEDICINE HEALTH SCREEN   Imported By: Arta Bruce 10/27/2010 14:34:02  _____________________________________________________________________  External Attachment:    Type:   Image     Comment:   External Document

## 2010-11-01 NOTE — Letter (Signed)
Summary: *Referral Letter  [facility]  [address] [place], [place] [address]   Phone: [phone]  Fax: [phone]    10/27/2010  Thank you in advance for agreeing to see my patient:  [name] [name] [address] [place], [place]  [address]  Phone: [phone]  Reason for Referral: Pt. with enlarging nodule overlying dorsal PIP joint of right index finger.  She does have RA, which has been controlled over the last year with Methotrexate.  Ms. Okray feels like the pain from the nodule is too great at this point and wants something done.  She follows with Rheumatology at [facility] outpatient clinics,  Procedures Requested: Evaluation and treatment of nodule, right index finger.  Current Medical Problems: 1)  LOCALIZED SUPERFICIAL SWELLING MASS OR LUMP, RIGHT INDEX FIN (ICD-782.2) 2)  FLANK PAIN, RIGHT (ICD-789.09) 3)  CALLUSES, FEET, BILATERAL (ICD-700) 4)  SINUSITIS, ACUTE (ICD-461.9) 5)  HEALTH MAINTENANCE EXAM (ICD-V70.0) 6)  ROUTINE GYNECOLOGICAL EXAMINATION (ICD-V72.31) 7)  CAD (ICD-414.00) 8)  DEHYDRATION (ICD-276.51) 9)  CHEST PAIN (ICD-786.50) 10)  SEBACEOUS CYST, INFECTED (ICD-706.2) 11)  ANEMIA (ICD-285.9) 12)  WRIST PAIN, RIGHT (ICD-719.43) 13)  SHOULDER PAIN, RIGHT (ICD-719.41) 14)  KNEE INJURY, LEFT (ICD-959.7) 15)  ALLERGIC RHINITIS (ICD-477.9) 16)  NAUSEA (ICD-787.02) 17)  GASTROENTERITIS, NOROVIRUS, ACUTE (ICD-008.8) 18)  ENCOUNTER FOR LONG-TERM USE OF OTHER MEDICATIONS (ICD-V58.69) 19)  ESSENTIAL HYPERTENSION (ICD-401.9) 20)  ARTHRITIS, RHEUMATOID (ICD-714.0) 21)  BRONCHITIS NOT SPECIFIED AS ACUTE OR CHRONIC (ICD-490) 22)  POLYARTHRITIS (ICD-719.49) 23)  UNSPECIFIED BREAST SCREENING (ICD-V76.10) 24)  VIRAL GASTROENTERITIS (ICD-008.8) 25)  DIABETIC  RETINOPATHY (ICD-250.50) 26)  HYPOGLYCEMIA (ICD-251.2) 27)  ACUTE OSTEOMYELITIS, ANKLE AND FOOT (ICD-730.07) 28)  PAIN IN JOINT, ANKLE AND FOOT (ICD-719.47) 29)  DIABETIC  PERIPHERAL NEUROPATHY (ICD-250.60) 30)  ULCER, ESOPHAGUS WITHOUT BLEEDING (ICD-530.20) 31)  ESOPHAGEAL STENOSIS (ICD-530.3) 32)  GASTROPARESIS, DIABETIC (ICD-250.60) 33)  RENAL INSUFFICIENCY, CHRONIC (ICD-585.9) 34)  SUBSTANCE ABUSE, MULTIPLE (ICD-305.90) 35)  IDDM (ICD-250.01) 36)  GERD (ICD-530.81) 37)  HYPERLIPIDEMIA (ICD-272.4) 38)  DEPRESSION (ICD-311) 39)  ANXIETY (ICD-300.00)   Current Medications: 1)  OMEPRAZOLE 40 MG CPDR (OMEPRAZOLE) 1 cap by mouth 1/2 hour before first meal of day. 2)  SERTRALINE HCL 100 MG TABS (SERTRALINE HCL) 1 1/2 tabs by mouth daily 3)  NOVOLOG FLEXPEN 100 UNIT/ML SOLN (INSULIN ASPART) Sliding scale ac:  <100:  0 units;  101-150: 4 u ; 151-200: 6 u; 201-250:8 u; 251-300: 10 u; 301-350: 12 u;351-400:14 u; 401-450: 16 u. 4)  * LANTUS PEN NEEDLES Use with lantus pen  Dx=250.3 Severe risk hypoglycemia 5)  LANTUS FOR OPTICLIK 100 UNIT/ML  SOLN (INSULIN GLARGINE) Inject 45 units Subcutaneously  every  morning 6)  REGLAN 10 MG  TABS (METOCLOPRAMIDE HCL) Take one tablet prior to each meal and at bedtime 7)  PRAVASTATIN SODIUM 40 MG TABS (PRAVASTATIN SODIUM) 1 tab by mouth daily with meal 8)  TRAMADOL HCL 50 MG TABS (TRAMADOL HCL) Take 1-2 tablets by mouth every 6 hours as needed for knee pain. Can also be taken with tylenol. 9)  LISINOPRIL 5 MG TABS (LISINOPRIL) 1 tab by mouth daily 10)  PLAVIX 75 MG TABS (CLOPIDOGREL BISULFATE) One tablet by mouth daily 11)  METOPROLOL TARTRATE 25 MG TABS (METOPROLOL TARTRATE) 1/2 tablet by mouth two times a day 12)  NITROGLYCERIN 0.4 MG/HR PT24 (NITROGLYCERIN) 1 tab sublingual as needed chest pain.  May repeat every 5 minutes x2 if not relieved. 13)  TREXALL 5 MG TABS (METHOTREXATE SODIUM) 3  tabs by mouth two times a day on [facility] 14)  FOLIC ACID 1 MG TABS (FOLIC ACID) 1 tab by mouth daily 15)  ACCU-CHEK AVIVA  KIT (BLOOD GLUCOSE MONITORING SUPPL) 4 times daily blood glucose monitoring 250.01 16)  ACCU-CHEK  AVIVA  STRP (GLUCOSE BLOOD) 4 times daily blood sugar testing. 17)  ACCU-CHEK SOFT TOUCH LANCETS  MISC (LANCETS) 4 times daily blood sugar testing. 18)  FERROUS SULFATE 325 (65 FE) MG TABS (FERROUS SULFATE) 1 tab by mouth daily   Past Medical History: 1)  IDDM 2)  H/O DKA-MULTIPLE HOSP 3)  HX PANCREATITIS 4)  POLYSUBSTANCE ABUSE (COCAINE,ETOH) 5)  ANEMIA 6)  ALCOHOLIC GASTRTITS 7)  PROTIENURIA 8)  GERD 9)  DIABETIC GASTROPARESIS 10)  SEVERE ESOPHAGEAL STENOSIS/ULCERATION 11)  CHRONIC RENAL INSUFFICIENCY 12)  Anxiety 13)  Depression 14)  Hyperlipidemia 15)  CAD with inferior MI 01/20/2009   Prior History of Blood Transfusions:   Pertinent Labs:    Thank you again for agreeing to see our patient; please contact us if you have any further questions or need additional information.  Sincerely,  Julieanne Manson MD

## 2010-11-01 NOTE — Assessment & Plan Note (Signed)
Summary: DM f/u and lump on index finer of right hand.    Vital Signs:  Patient profile:   45 year old female Menstrual status:  regular LMP:     10/14/2010 Weight:      168.56 pounds Temp:     98.6 degrees F oral Pulse rate:   90 / minute Pulse rhythm:   regular Resp:     26 per minute BP sitting:   112 / 63  (left arm) Cuff size:   regular  Vitals Entered By: Hale Drone CMA (October 27, 2010 12:25 PM) CC: DM f/u and concerned about lump on right index finger... Painful and not able to bend finger at time... Has been to the hosp several times... Case worker from [facility] is here w/pt. ... Her last insulin shot was around 10am.  Is Patient Diabetic? Yes Pain Assessment Patient in pain? yes      CBG Result 416 CBG Device ID [id number] non fasting   Does patient need assistance? Functional Status Self care Ambulation Normal LMP (date): 10/14/2010 LMP - Character: normal Menarche (age onset years): 13    Menstrual flow (days): 3-4 Enter LMP: 10/14/2010 Last PAP Result NEGATIVE FOR INTRAEPITHELIAL LESIONS OR MALIGNANCY.   Primary Care Provider:  [facility]  CC:  DM f/u and concerned about lump on right index finger... Painful and not able to bend finger at time... Has been to the hosp several times... Case worker from [facility] is here w/pt. ... Her last insulin shot was around 10am. .  History of Present Illness: 1.  DM:  needs a copy of her sliding scale for Novolog--just guesstimating her dose.  Discussed her dosing will always be listed on her discharge instruction sheets.  Needs diabetic eye check--[facility].  Needs note that she can have blood drawn from feet.  A1c in January was 7.3%.  States sugars generally in low to mid 100s.  Highest it has been is 184.  Ate twice today before taking today.  Was not at home to give lunch Novolog. P4HM is referring to Nutrition.  2.  Cyst on right index finger, PIP joint--getting bigger and painful.  Last Rheum visit was in January.  Rheum has  told her to leave alone.  Has been going to ED for pain medication repeatedly and got "messed up"  on pain meds.  Was also having right flank pain.  Still has both percocet and Tramadol for her different pains.  3.  Tobacco Cessation:  not interested in nicotine patches or Chantix.  Has been to [facility] smoking cessation program.    4.  Depression:  P4HM, Maryruth Hancock, is getting her set up with [facility].  Scored a 17 on PHQ 9 today, though not clear she really is having much difficulty--laughing about it today--was just marking down answers.  Is taking Sertraline regularly.  No suicidal thoughts.  Laughingly states she could cut somebody's head off at times when they make her mad.    5.  RA:  Except for right shoulder and left knee as well as the cyst on finger above, her joint symptoms are well controlled on MTX.  Has an appt. in July with Rheum at [facility]./  6.  Right flank pain:  went to ED 4 times for this:  Last was the 3rd of March, when they gave her Percocet and that "killed the pain"  Had CT of abdomen and pelvis that showed bibasilar atelectasis vs infiltrates--treated with abx then, also with moderate stool burden  on uterine fibroids.  No other abnormalities to explain.  STates when stopped her 2 cholesterol meds, the pain stopped.  Not clear what meds she is referring to as I have her only taking Pravastatin.  May have been on a second agent through Cardiology  Current Medications (verified): 1)  Omeprazole 40 Mg Cpdr (Omeprazole) .Marland Kitchen.. 1 Cap By Mouth 1/2 Hour Before First Meal of Day. 2)  Sertraline Hcl 100 Mg Tabs (Sertraline Hcl) .Marland Kitchen.. 1 1/2 Tabs By Mouth Daily 3)  Novolog Flexpen 100 Unit/ml Soln (Insulin Aspart) .... Sliding Scale Ac:  <100:  0 Units;  101-150: 4 U ; 151-200: 6 U; 201-250:8 U; 251-300: 10 U; 301-350: 12 U;351-400:14 U; 401-450: 16 U. 4)  Lantus Pen Needles .... Use With Lantus Pen  Dx=250.3 Severe Risk Hypoglycemia 5)  Lantus For Opticlik 100 Unit/ml  Soln (Insulin  Glargine) .... Inject 45 Units Subcutaneously  Every  Morning 6)  Reglan 10 Mg  Tabs (Metoclopramide Hcl) .... Take One Tablet Prior To Each Meal and At Bedtime 7)  Pravastatin Sodium 40 Mg Tabs (Pravastatin Sodium) .Marland Kitchen.. 1 Tab By Mouth Daily With Meal 8)  Tramadol Hcl 50 Mg Tabs (Tramadol Hcl) .... Take 1-2 Tablets By Mouth Every 6 Hours As Needed For Knee Pain. Can Also Be Taken With Tylenol. 9)  Lisinopril 5 Mg Tabs (Lisinopril) .Marland Kitchen.. 1 Tab By Mouth Daily 10)  Plavix 75 Mg Tabs (Clopidogrel Bisulfate) .... One Tablet By Mouth Daily 11)  Metoprolol Tartrate 25 Mg Tabs (Metoprolol Tartrate) .... 1/2 Tablet By Mouth Two Times A Day 12)  Nitroglycerin 0.4 Mg/hr Pt24 (Nitroglycerin) .Marland Kitchen.. 1 Tab Sublingual As Needed Chest Pain.  May Repeat Every 5 Minutes X2 If Not Relieved. 13)  Trexall 5 Mg Tabs (Methotrexate Sodium) .... 3 Tabs By Mouth Two Times A Day On Wednesdays--[facility] Rhe[facility] 14)  Folic Acid 1 Mg Tabs (Folic Acid) .Marland Kitchen.. 1 Tab By Mouth Daily 15)  Prodigy Blood Glucose Monitor W/device Kit (Blood Glucose Monitoring Suppl) .... 4 Times Daily Blood Sugars 16)  Prodigy Blood Glucose Test  Strp (Glucose Blood) .... 4 Times Daily Blood Testing 17)  Prodigy Lancets 26g  Misc (Lancets) .... 4 Times Daily Blood Testing 18)  Ferrous Sulfate 325 (65 Fe) Mg Tabs (Ferrous Sulfate) .Marland Kitchen.. 1 Tab By Mouth Daily 19)  Azithromycin 250 Mg Tabs (Azithromycin) .... 2 Tabs By Mouth Today, Then 1 Tab By Mouth Daily For 4 More Days  Allergies (verified): 1)  ! Codeine  Physical Exam  General:  NAD Lungs:  Normal respiratory effort, chest expands symmetrically. Lungs are clear to auscultation, no crackles or wheezes. Heart:  Normal rate and regular rhythm. S1 and S2 normal without gallop, murmur, click, rub or other extra sounds. Abdomen:  Bowel sounds positive,abdomen soft and non-tender without masses, organomegaly or hernias noted. Extremities:  Right index finger with hard nodules overlying PIP joint dorsally.   Larger then previously noted.  No erythema   Impression & Recommendations:  Problem # 1:  FLANK PAIN, RIGHT (ICD-789.09) Pt. relates to cholesterol lowering medication--we just have Pravastatin here--will check with pharmacy and pt. to call back with names of the meds she stopped and decide where to go from there. Her updated medication list for this problem includes:    Tramadol Hcl 50 Mg Tabs (Tramadol hcl) .Marland Kitchen... Take 1-2 tablets by mouth every 6 hours as needed for knee pain. can also be taken with tylenol.  Problem # 2:  ARTHRITIS, RHEUMATOID (ICD-714.0) Schedule for repeat labs  Her updated medication list for this problem includes:    Trexall 5 Mg Tabs (Methotrexate sodium) .Marland KitchenMarland KitchenMarland KitchenMarland Kitchen 3 tabs by mouth two times a day on wednesdays--[facility] rheum  Problem # 3:  IDDM (ICD-250.01) By report, under good control Send to eye doctor for yearly exam Her updated medication list for this problem includes:    Novolog Flexpen 100 Unit/ml Soln (Insulin aspart) ..... Sliding scale ac:  <100:  0 units;  101-150: 4 u ; 151-200: 6 u; 201-250:8 u; 251-300: 10 u; 301-350: 12 u;351-400:14 u; 401-450: 16 u.    Lantus For Opticlik 100 Unit/ml Soln (Insulin glargine) ..... Inject 45 units subcutaneously  every  morning    Lisinopril 5 Mg Tabs (Lisinopril) .Marland Kitchen... 1 tab by mouth daily  Problem # 4:  LOCALIZED SUPERFICIAL SWELLING MASS OR LUMP, RIGHT INDEX FIN (ICD-782.2)  Refer to hand surgeon-not sure if a rheumatoid nodule Will let Rheumatology know that she is being referred Pt feels the pain requires action at this point  Orders: Orthopedic Surgeon Referral (Ortho Surgeon)  Problem # 5:  DIABETIC  RETINOPATHY (ICD-250.50)  Her updated medication list for this problem includes:    Novolog Flexpen 100 Unit/ml Soln (Insulin aspart) ..... Sliding scale ac:  <100:  0 units;  101-150: 4 u ; 151-200: 6 u; 201-250:8 u; 251-300: 10 u; 301-350: 12 u;351-400:14 u; 401-450: 16 u.    Lantus For Opticlik 100 Unit/ml  Soln (Insulin glargine) ..... Inject 45 units subcutaneously  every  morning    Lisinopril 5 Mg Tabs (Lisinopril) .Marland Kitchen... 1 tab by mouth daily  Orders: Ophthalmology Referral (Ophthalmology)  Problem # 6:  DEPRESSION (ICD-311) Continues on meds. Not clear from speaking with pt. today how much difficulty she is truly having, but certainly counseling may be helpful. Her updated medication list for this problem includes:    Sertraline Hcl 100 Mg Tabs (Sertraline hcl) .Marland Kitchen... 1 1/2 tabs by mouth daily  Complete Medication List: 1)  Omeprazole 40 Mg Cpdr (Omeprazole) .Marland Kitchen.. 1 cap by mouth 1/2 hour before first meal of day. 2)  Sertraline Hcl 100 Mg Tabs (Sertraline hcl) .Marland Kitchen.. 1 1/2 tabs by mouth daily 3)  Novolog Flexpen 100 Unit/ml Soln (Insulin aspart) .... Sliding scale ac:  <100:  0 units;  101-150: 4 u ; 151-200: 6 u; 201-250:8 u; 251-300: 10 u; 301-350: 12 u;351-400:14 u; 401-450: 16 u. 4)  Lantus Pen Needles  .... Use with lantus pen  dx=250.3 severe risk hypoglycemia 5)  Lantus For Opticlik 100 Unit/ml Soln (Insulin glargine) .... Inject 45 units subcutaneously  every  morning 6)  Reglan 10 Mg Tabs (Metoclopramide hcl) .... Take one tablet prior to each meal and at bedtime 7)  Pravastatin Sodium 40 Mg Tabs (Pravastatin sodium) .Marland Kitchen.. 1 tab by mouth daily with meal 8)  Tramadol Hcl 50 Mg Tabs (Tramadol hcl) .... Take 1-2 tablets by mouth every 6 hours as needed for knee pain. can also be taken with tylenol. 9)  Lisinopril 5 Mg Tabs (Lisinopril) .Marland Kitchen.. 1 tab by mouth daily 10)  Plavix 75 Mg Tabs (Clopidogrel bisulfate) .... One tablet by mouth daily 11)  Metoprolol Tartrate 25 Mg Tabs (Metoprolol tartrate) .... 1/2 tablet by mouth two times a day 12)  Nitroglycerin 0.4 Mg/hr Pt24 (Nitroglycerin) .Marland Kitchen.. 1 tab sublingual as needed chest pain.  may repeat every 5 minutes x2 if not relieved. 13)  Trexall 5 Mg Tabs (Methotrexate sodium) .... 3 tabs by mouth two times a day on wednesdays--[facility] rheum 14)  Folic Acid 1 Mg Tabs (Folic acid) .Marland Kitchen.. 1 tab by mouth daily 15)  Accu-chek Aviva Kit (Blood glucose monitoring suppl) .... 4 times daily blood glucose monitoring 250.01 16)  Accu-chek Aviva Strp (Glucose blood) .... 4 times daily blood sugar testing. 17)  Accu-chek Soft Touch Lancets Misc (Lancets) .... 4 times daily blood sugar testing. 18)  Ferrous Sulfate 325 (65 Fe) Mg Tabs (Ferrous sulfate) .Marland Kitchen.. 1 tab by mouth daily  Other Orders: Capillary Blood Glucose/CBG (04540)  Patient Instructions: 1)  Please call office with info regarding what medication you stopped before pain went away. 2)  Please make sure pt. scheduled for repeat lab visit for CBC and Cmet --6 weeks after her last labs on 10/06/10 3)  Follow up with Dr. Delrae Alfred in 4 months  Prescriptions: ACCU-CHEK SOFT TOUCH LANCETS  MISC (LANCETS) 4 times daily blood sugar testing.  #125 x 11   Entered and Authorized by:   Julieanne Manson MD   Signed by:   Julieanne Manson MD on 10/27/2010   Method used:   Electronically to        [facility] [phone]* (retail)       [address]       [place], [place]  [address]       Ph: [phone]       Fax: [phone]   RxID:   [id number] ACCU-CHEK AVIVA  STRP (GLUCOSE BLOOD) 4 times daily blood sugar testing.  #125 x 11   Entered and Authorized by:   Julieanne Manson MD   Signed by:   Julieanne Manson MD on 10/27/2010   Method used:   Electronically to        [facility] [phone]* (retail)       [address]       [place], [place]  [address]       Ph: [phone]       Fax: [phone]   RxID:   [id number] ACCU-CHEK AVIVA  KIT (BLOOD GLUCOSE MONITORING SUPPL) 4 times daily blood glucose monitoring 250.01  #1 x 0   Entered and Authorized by:   Julieanne Manson MD   Signed by:   Julieanne Manson MD on 10/27/2010   Method used:   Electronically to        [facility] [phone]* (retail)       [address]       [place], [place]  [address]        Ph: [phone]       Fax: [phone]   RxID:   [id number]    Orders Added: 1)  Capillary Blood Glucose/CBG [03500] 2)  Ophthalmology Referral [Ophthalmology] 3)  Orthopedic Surgeon Referral [Ortho Surgeon] 4)  Est. Patient Level IV [93818]

## 2010-11-03 LAB — GLUCOSE, CAPILLARY
Glucose-Capillary: 115 mg/dL — ABNORMAL HIGH (ref 70–99)
Glucose-Capillary: 188 mg/dL — ABNORMAL HIGH (ref 70–99)
Glucose-Capillary: 329 mg/dL — ABNORMAL HIGH (ref 70–99)
Glucose-Capillary: 51 mg/dL — ABNORMAL LOW (ref 70–99)
Glucose-Capillary: 57 mg/dL — ABNORMAL LOW (ref 70–99)
Glucose-Capillary: 63 mg/dL — ABNORMAL LOW (ref 70–99)
Glucose-Capillary: 93 mg/dL (ref 70–99)

## 2010-11-04 LAB — GLUCOSE, CAPILLARY
Glucose-Capillary: 189 mg/dL — ABNORMAL HIGH (ref 70–99)
Glucose-Capillary: 201 mg/dL — ABNORMAL HIGH (ref 70–99)
Glucose-Capillary: 224 mg/dL — ABNORMAL HIGH (ref 70–99)

## 2010-11-05 LAB — GLUCOSE, CAPILLARY
Glucose-Capillary: 116 mg/dL — ABNORMAL HIGH (ref 70–99)
Glucose-Capillary: 175 mg/dL — ABNORMAL HIGH (ref 70–99)
Glucose-Capillary: 305 mg/dL — ABNORMAL HIGH (ref 70–99)
Glucose-Capillary: 82 mg/dL (ref 70–99)

## 2010-11-06 LAB — GLUCOSE, CAPILLARY
Glucose-Capillary: 186 mg/dL — ABNORMAL HIGH (ref 70–99)
Glucose-Capillary: 188 mg/dL — ABNORMAL HIGH (ref 70–99)
Glucose-Capillary: 199 mg/dL — ABNORMAL HIGH (ref 70–99)
Glucose-Capillary: 208 mg/dL — ABNORMAL HIGH (ref 70–99)
Glucose-Capillary: 232 mg/dL — ABNORMAL HIGH (ref 70–99)
Glucose-Capillary: 60 mg/dL — ABNORMAL LOW (ref 70–99)

## 2010-11-07 LAB — DIFFERENTIAL
Basophils Absolute: 0 10*3/uL (ref 0.0–0.1)
Basophils Absolute: 0.1 10*3/uL (ref 0.0–0.1)
Basophils Relative: 0 % (ref 0–1)
Basophils Relative: 1 % (ref 0–1)
Eosinophils Absolute: 0.1 10*3/uL (ref 0.0–0.7)
Eosinophils Absolute: 0.2 10*3/uL (ref 0.0–0.7)
Eosinophils Absolute: 0.2 10*3/uL (ref 0.0–0.7)
Eosinophils Relative: 1 % (ref 0–5)
Eosinophils Relative: 1 % (ref 0–5)
Lymphocytes Relative: 14 % (ref 12–46)
Lymphocytes Relative: 7 % — ABNORMAL LOW (ref 12–46)
Lymphocytes Relative: 9 % — ABNORMAL LOW (ref 12–46)
Lymphs Abs: 1.4 10*3/uL (ref 0.7–4.0)
Lymphs Abs: 1.8 10*3/uL (ref 0.7–4.0)
Lymphs Abs: 4.5 10*3/uL — ABNORMAL HIGH (ref 0.7–4.0)
Monocytes Absolute: 0.8 10*3/uL (ref 0.1–1.0)
Monocytes Absolute: 1.6 10*3/uL — ABNORMAL HIGH (ref 0.1–1.0)
Monocytes Absolute: 1.7 10*3/uL — ABNORMAL HIGH (ref 0.1–1.0)
Monocytes Relative: 10 % (ref 3–12)
Monocytes Relative: 7 % (ref 3–12)
Monocytes Relative: 8 % (ref 3–12)
Neutro Abs: 15.7 10*3/uL — ABNORMAL HIGH (ref 1.7–7.7)
Neutro Abs: 15.9 10*3/uL — ABNORMAL HIGH (ref 1.7–7.7)
Neutrophils Relative %: 75 % (ref 43–77)
Neutrophils Relative %: 76 % (ref 43–77)
Neutrophils Relative %: 81 % — ABNORMAL HIGH (ref 43–77)
Neutrophils Relative %: 84 % — ABNORMAL HIGH (ref 43–77)

## 2010-11-07 LAB — POCT I-STAT 3, ART BLOOD GAS (G3+)
Acid-base deficit: 3 mmol/L — ABNORMAL HIGH (ref 0.0–2.0)
Bicarbonate: 21.5 mEq/L (ref 20.0–24.0)
O2 Saturation: 88 %
O2 Saturation: 95 %
TCO2: 24 mmol/L (ref 0–100)
pCO2 arterial: 37.5 mmHg (ref 35.0–45.0)
pH, Arterial: 7.45 — ABNORMAL HIGH (ref 7.350–7.400)
pO2, Arterial: 78 mmHg — ABNORMAL LOW (ref 80.0–100.0)

## 2010-11-07 LAB — PROTIME-INR
INR: 2.6 — ABNORMAL HIGH (ref 0.00–1.49)
Prothrombin Time: 29.3 seconds — ABNORMAL HIGH (ref 11.6–15.2)

## 2010-11-07 LAB — CBC
HCT: 25.5 % — ABNORMAL LOW (ref 36.0–46.0)
HCT: 26.4 % — ABNORMAL LOW (ref 36.0–46.0)
HCT: 26.8 % — ABNORMAL LOW (ref 36.0–46.0)
HCT: 27 % — ABNORMAL LOW (ref 36.0–46.0)
HCT: 27.8 % — ABNORMAL LOW (ref 36.0–46.0)
HCT: 28.9 % — ABNORMAL LOW (ref 36.0–46.0)
HCT: 29 % — ABNORMAL LOW (ref 36.0–46.0)
HCT: 31.7 % — ABNORMAL LOW (ref 36.0–46.0)
HCT: 34.7 % — ABNORMAL LOW (ref 36.0–46.0)
Hemoglobin: 10.6 g/dL — ABNORMAL LOW (ref 12.0–15.0)
Hemoglobin: 11.7 g/dL — ABNORMAL LOW (ref 12.0–15.0)
Hemoglobin: 8.4 g/dL — ABNORMAL LOW (ref 12.0–15.0)
Hemoglobin: 8.7 g/dL — ABNORMAL LOW (ref 12.0–15.0)
Hemoglobin: 8.8 g/dL — ABNORMAL LOW (ref 12.0–15.0)
Hemoglobin: 9.1 g/dL — ABNORMAL LOW (ref 12.0–15.0)
Hemoglobin: 9.5 g/dL — ABNORMAL LOW (ref 12.0–15.0)
Hemoglobin: 9.6 g/dL — ABNORMAL LOW (ref 12.0–15.0)
MCHC: 31.9 g/dL (ref 30.0–36.0)
MCHC: 33.1 g/dL (ref 30.0–36.0)
MCHC: 33.3 g/dL (ref 30.0–36.0)
MCHC: 33.7 g/dL (ref 30.0–36.0)
MCHC: 34 g/dL (ref 30.0–36.0)
MCV: 79.7 fL (ref 78.0–100.0)
MCV: 80.7 fL (ref 78.0–100.0)
MCV: 81 fL (ref 78.0–100.0)
MCV: 81.5 fL (ref 78.0–100.0)
MCV: 83.8 fL (ref 78.0–100.0)
Platelets: 267 10*3/uL (ref 150–400)
Platelets: 332 10*3/uL (ref 150–400)
Platelets: 333 10*3/uL (ref 150–400)
Platelets: 336 10*3/uL (ref 150–400)
Platelets: 355 10*3/uL (ref 150–400)
Platelets: 378 10*3/uL (ref 150–400)
Platelets: 464 10*3/uL — ABNORMAL HIGH (ref 150–400)
Platelets: 478 10*3/uL — ABNORMAL HIGH (ref 150–400)
Platelets: 482 10*3/uL — ABNORMAL HIGH (ref 150–400)
RBC: 3.24 MIL/uL — ABNORMAL LOW (ref 3.87–5.11)
RBC: 3.35 MIL/uL — ABNORMAL LOW (ref 3.87–5.11)
RBC: 3.45 MIL/uL — ABNORMAL LOW (ref 3.87–5.11)
RBC: 3.46 MIL/uL — ABNORMAL LOW (ref 3.87–5.11)
RDW: 17.2 % — ABNORMAL HIGH (ref 11.5–15.5)
RDW: 17.5 % — ABNORMAL HIGH (ref 11.5–15.5)
RDW: 17.6 % — ABNORMAL HIGH (ref 11.5–15.5)
RDW: 18.1 % — ABNORMAL HIGH (ref 11.5–15.5)
RDW: 18.2 % — ABNORMAL HIGH (ref 11.5–15.5)
RDW: 18.2 % — ABNORMAL HIGH (ref 11.5–15.5)
RDW: 18.3 % — ABNORMAL HIGH (ref 11.5–15.5)
RDW: 18.5 % — ABNORMAL HIGH (ref 11.5–15.5)
RDW: 19 % — ABNORMAL HIGH (ref 11.5–15.5)
WBC: 10.1 10*3/uL (ref 4.0–10.5)
WBC: 11.5 10*3/uL — ABNORMAL HIGH (ref 4.0–10.5)
WBC: 12.5 10*3/uL — ABNORMAL HIGH (ref 4.0–10.5)
WBC: 14.5 10*3/uL — ABNORMAL HIGH (ref 4.0–10.5)
WBC: 16 10*3/uL — ABNORMAL HIGH (ref 4.0–10.5)
WBC: 19.6 10*3/uL — ABNORMAL HIGH (ref 4.0–10.5)
WBC: 20.4 10*3/uL — ABNORMAL HIGH (ref 4.0–10.5)

## 2010-11-07 LAB — GLUCOSE, CAPILLARY
Glucose-Capillary: 104 mg/dL — ABNORMAL HIGH (ref 70–99)
Glucose-Capillary: 109 mg/dL — ABNORMAL HIGH (ref 70–99)
Glucose-Capillary: 111 mg/dL — ABNORMAL HIGH (ref 70–99)
Glucose-Capillary: 112 mg/dL — ABNORMAL HIGH (ref 70–99)
Glucose-Capillary: 120 mg/dL — ABNORMAL HIGH (ref 70–99)
Glucose-Capillary: 123 mg/dL — ABNORMAL HIGH (ref 70–99)
Glucose-Capillary: 128 mg/dL — ABNORMAL HIGH (ref 70–99)
Glucose-Capillary: 131 mg/dL — ABNORMAL HIGH (ref 70–99)
Glucose-Capillary: 134 mg/dL — ABNORMAL HIGH (ref 70–99)
Glucose-Capillary: 142 mg/dL — ABNORMAL HIGH (ref 70–99)
Glucose-Capillary: 147 mg/dL — ABNORMAL HIGH (ref 70–99)
Glucose-Capillary: 154 mg/dL — ABNORMAL HIGH (ref 70–99)
Glucose-Capillary: 155 mg/dL — ABNORMAL HIGH (ref 70–99)
Glucose-Capillary: 162 mg/dL — ABNORMAL HIGH (ref 70–99)
Glucose-Capillary: 169 mg/dL — ABNORMAL HIGH (ref 70–99)
Glucose-Capillary: 183 mg/dL — ABNORMAL HIGH (ref 70–99)
Glucose-Capillary: 185 mg/dL — ABNORMAL HIGH (ref 70–99)
Glucose-Capillary: 191 mg/dL — ABNORMAL HIGH (ref 70–99)
Glucose-Capillary: 201 mg/dL — ABNORMAL HIGH (ref 70–99)
Glucose-Capillary: 201 mg/dL — ABNORMAL HIGH (ref 70–99)
Glucose-Capillary: 204 mg/dL — ABNORMAL HIGH (ref 70–99)
Glucose-Capillary: 208 mg/dL — ABNORMAL HIGH (ref 70–99)
Glucose-Capillary: 218 mg/dL — ABNORMAL HIGH (ref 70–99)
Glucose-Capillary: 249 mg/dL — ABNORMAL HIGH (ref 70–99)
Glucose-Capillary: 249 mg/dL — ABNORMAL HIGH (ref 70–99)
Glucose-Capillary: 251 mg/dL — ABNORMAL HIGH (ref 70–99)
Glucose-Capillary: 261 mg/dL — ABNORMAL HIGH (ref 70–99)
Glucose-Capillary: 281 mg/dL — ABNORMAL HIGH (ref 70–99)
Glucose-Capillary: 319 mg/dL — ABNORMAL HIGH (ref 70–99)
Glucose-Capillary: 320 mg/dL — ABNORMAL HIGH (ref 70–99)
Glucose-Capillary: 33 mg/dL — CL (ref 70–99)
Glucose-Capillary: 431 mg/dL — ABNORMAL HIGH (ref 70–99)
Glucose-Capillary: 584 mg/dL (ref 70–99)
Glucose-Capillary: 69 mg/dL — ABNORMAL LOW (ref 70–99)
Glucose-Capillary: 70 mg/dL (ref 70–99)
Glucose-Capillary: 91 mg/dL (ref 70–99)
Glucose-Capillary: 91 mg/dL (ref 70–99)
Glucose-Capillary: 95 mg/dL (ref 70–99)
Glucose-Capillary: 99 mg/dL (ref 70–99)

## 2010-11-07 LAB — BASIC METABOLIC PANEL
BUN: 19 mg/dL (ref 6–23)
BUN: 28 mg/dL — ABNORMAL HIGH (ref 6–23)
BUN: 28 mg/dL — ABNORMAL HIGH (ref 6–23)
BUN: 32 mg/dL — ABNORMAL HIGH (ref 6–23)
BUN: 41 mg/dL — ABNORMAL HIGH (ref 6–23)
BUN: 47 mg/dL — ABNORMAL HIGH (ref 6–23)
BUN: 48 mg/dL — ABNORMAL HIGH (ref 6–23)
CO2: 18 mEq/L — ABNORMAL LOW (ref 19–32)
CO2: 18 mEq/L — ABNORMAL LOW (ref 19–32)
CO2: 22 mEq/L (ref 19–32)
CO2: 23 mEq/L (ref 19–32)
CO2: 25 mEq/L (ref 19–32)
Calcium: 8 mg/dL — ABNORMAL LOW (ref 8.4–10.5)
Calcium: 8 mg/dL — ABNORMAL LOW (ref 8.4–10.5)
Calcium: 8.1 mg/dL — ABNORMAL LOW (ref 8.4–10.5)
Calcium: 8.2 mg/dL — ABNORMAL LOW (ref 8.4–10.5)
Chloride: 102 mEq/L (ref 96–112)
Chloride: 103 mEq/L (ref 96–112)
Chloride: 103 mEq/L (ref 96–112)
Chloride: 103 mEq/L (ref 96–112)
Chloride: 110 mEq/L (ref 96–112)
Chloride: 96 mEq/L (ref 96–112)
Creatinine, Ser: 1.08 mg/dL (ref 0.4–1.2)
Creatinine, Ser: 1.83 mg/dL — ABNORMAL HIGH (ref 0.4–1.2)
Creatinine, Ser: 3.18 mg/dL — ABNORMAL HIGH (ref 0.4–1.2)
Creatinine, Ser: 3.36 mg/dL — ABNORMAL HIGH (ref 0.4–1.2)
Creatinine, Ser: 3.81 mg/dL — ABNORMAL HIGH (ref 0.4–1.2)
GFR calc Af Amer: 16 mL/min — ABNORMAL LOW (ref >60)
GFR calc Af Amer: 18 mL/min — ABNORMAL LOW (ref >60)
GFR calc Af Amer: 37 mL/min — ABNORMAL LOW (ref >60)
GFR calc Af Amer: 44 mL/min — ABNORMAL LOW (ref >60)
GFR calc Af Amer: >60 mL/min (ref >60)
GFR calc Af Amer: >60 mL/min (ref >60)
GFR calc non Af Amer: 13 mL/min — ABNORMAL LOW (ref >60)
GFR calc non Af Amer: 15 mL/min — ABNORMAL LOW (ref >60)
GFR calc non Af Amer: 30 mL/min — ABNORMAL LOW (ref >60)
GFR calc non Af Amer: 33 mL/min — ABNORMAL LOW (ref >60)
GFR calc non Af Amer: 37 mL/min — ABNORMAL LOW (ref >60)
GFR calc non Af Amer: 56 mL/min — ABNORMAL LOW (ref >60)
GFR calc non Af Amer: 58 mL/min — ABNORMAL LOW (ref >60)
GFR calc non Af Amer: >60 mL/min (ref >60)
Glucose, Bld: 122 mg/dL — ABNORMAL HIGH (ref 70–99)
Glucose, Bld: 128 mg/dL — ABNORMAL HIGH (ref 70–99)
Glucose, Bld: 158 mg/dL — ABNORMAL HIGH (ref 70–99)
Glucose, Bld: 211 mg/dL — ABNORMAL HIGH (ref 70–99)
Glucose, Bld: 263 mg/dL — ABNORMAL HIGH (ref 70–99)
Glucose, Bld: 333 mg/dL — ABNORMAL HIGH (ref 70–99)
Potassium: 3.5 mEq/L (ref 3.5–5.1)
Potassium: 3.8 mEq/L (ref 3.5–5.1)
Potassium: 3.9 mEq/L (ref 3.5–5.1)
Potassium: 4.1 mEq/L (ref 3.5–5.1)
Potassium: 4.3 mEq/L (ref 3.5–5.1)
Potassium: 4.3 mEq/L (ref 3.5–5.1)
Potassium: 4.6 mEq/L (ref 3.5–5.1)
Potassium: 4.6 mEq/L (ref 3.5–5.1)
Potassium: 5.6 mEq/L — ABNORMAL HIGH (ref 3.5–5.1)
Sodium: 128 mEq/L — ABNORMAL LOW (ref 135–145)
Sodium: 133 mEq/L — ABNORMAL LOW (ref 135–145)
Sodium: 134 mEq/L — ABNORMAL LOW (ref 135–145)
Sodium: 135 mEq/L (ref 135–145)
Sodium: 136 mEq/L (ref 135–145)
Sodium: 136 mEq/L (ref 135–145)

## 2010-11-07 LAB — SJOGRENS SYNDROME-A EXTRACTABLE NUCLEAR ANTIBODY: SSA (Ro) (ENA) Antibody, IgG: 1.8 AI — ABNORMAL HIGH (ref <1.0)

## 2010-11-07 LAB — HEMOGLOBIN A1C
Hgb A1c MFr Bld: 8.3 % — ABNORMAL HIGH (ref 4.6–6.1)
Mean Plasma Glucose: 192 mg/dL

## 2010-11-07 LAB — CROSSMATCH
ABO/RH(D): O POS
Antibody Screen: NEGATIVE

## 2010-11-07 LAB — COMPREHENSIVE METABOLIC PANEL
ALT: <8 U/L (ref 0–35)
Albumin: 2.3 g/dL — ABNORMAL LOW (ref 3.5–5.2)
Alkaline Phosphatase: 94 U/L (ref 39–117)
Chloride: 105 mEq/L (ref 96–112)
Glucose, Bld: 370 mg/dL — ABNORMAL HIGH (ref 70–99)
Potassium: 4.6 mEq/L (ref 3.5–5.1)
Sodium: 130 mEq/L — ABNORMAL LOW (ref 135–145)
Total Bilirubin: 0.5 mg/dL (ref 0.3–1.2)
Total Protein: 6.8 g/dL (ref 6.0–8.3)

## 2010-11-07 LAB — POCT I-STAT 3, VENOUS BLOOD GAS (G3P V)
TCO2: 28 mmol/L (ref 0–100)
pCO2, Ven: 34 mmHg — ABNORMAL LOW (ref 45.0–50.0)
pH, Ven: 7.506 — ABNORMAL HIGH (ref 7.250–7.300)

## 2010-11-07 LAB — GC/CHLAMYDIA PROBE AMP, URINE
Chlamydia, Swab/Urine, PCR: NEGATIVE
GC Probe Amp, Urine: NEGATIVE

## 2010-11-07 LAB — LIPID PANEL
Cholesterol: 113 mg/dL (ref 0–200)
HDL: 37 mg/dL — ABNORMAL LOW (ref >39)
LDL Cholesterol: 63 mg/dL (ref 0–99)
Total CHOL/HDL Ratio: 3.1 RATIO
VLDL: 13 mg/dL (ref 0–40)

## 2010-11-07 LAB — URINALYSIS, ROUTINE W REFLEX MICROSCOPIC
Glucose, UA: >1000 mg/dL — AB
Ketones, ur: 15 mg/dL — AB
Nitrite: NEGATIVE
Specific Gravity, Urine: 1.024 (ref 1.005–1.030)
pH: 5.5 (ref 5.0–8.0)

## 2010-11-07 LAB — CARDIAC PANEL(CRET KIN+CKTOT+MB+TROPI)
CK, MB: 12.1 ng/mL — ABNORMAL HIGH (ref 0.3–4.0)
CK, MB: 13.4 ng/mL — ABNORMAL HIGH (ref 0.3–4.0)
Relative Index: 7.4 — ABNORMAL HIGH (ref 0.0–2.5)
Relative Index: 8 — ABNORMAL HIGH (ref 0.0–2.5)
Relative Index: 8.3 — ABNORMAL HIGH (ref 0.0–2.5)
Total CK: 170 U/L (ref 7–177)
Total CK: 198 U/L — ABNORMAL HIGH (ref 7–177)
Troponin I: 16.41 ng/mL (ref 0.00–0.06)
Troponin I: 22.84 ng/mL (ref 0.00–0.06)

## 2010-11-07 LAB — BRAIN NATRIURETIC PEPTIDE
Pro B Natriuretic peptide (BNP): 1115 pg/mL — ABNORMAL HIGH (ref 0.0–100.0)
Pro B Natriuretic peptide (BNP): 1922 pg/mL — ABNORMAL HIGH (ref 0.0–100.0)
Pro B Natriuretic peptide (BNP): 2362 pg/mL — ABNORMAL HIGH (ref 0.0–100.0)
Pro B Natriuretic peptide (BNP): 908 pg/mL — ABNORMAL HIGH (ref 0.0–100.0)

## 2010-11-07 LAB — ACETAMINOPHEN LEVEL: Acetaminophen (Tylenol), Serum: <10 ug/mL — ABNORMAL LOW (ref 10–30)

## 2010-11-07 LAB — URINE MICROSCOPIC-ADD ON

## 2010-11-07 LAB — ETHANOL: Alcohol, Ethyl (B): <5 mg/dL (ref 0–10)

## 2010-11-07 LAB — HIV ANTIBODY (ROUTINE TESTING W REFLEX): HIV: NONREACTIVE

## 2010-11-07 LAB — MYOGLOBIN, SERUM: Myoglobin: 36 ng/mL (ref <111)

## 2010-11-07 LAB — HEPATIC FUNCTION PANEL
ALT: 9 U/L (ref 0–35)
AST: 45 U/L — ABNORMAL HIGH (ref 0–37)
Bilirubin, Direct: <0.1 mg/dL (ref 0.0–0.3)
Total Protein: 5.7 g/dL — ABNORMAL LOW (ref 6.0–8.3)

## 2010-11-07 LAB — URINE CULTURE

## 2010-11-07 LAB — TROPONIN I: Troponin I: 16.98 ng/mL (ref 0.00–0.06)

## 2010-11-07 LAB — ANTI-SCLERODERMA ANTIBODY: Scleroderma (Scl-70) (ENA) Antibody, IgG: <0.2 AI (ref <1.0)

## 2010-11-07 LAB — CENTROMERE ANTIBODIES: Centromere Ab Screen: <0.2 AI (ref <1.0)

## 2010-11-07 LAB — CK TOTAL AND CKMB (NOT AT ARMC): Relative Index: 6.7 — ABNORMAL HIGH (ref 0.0–2.5)

## 2010-11-07 LAB — SJOGRENS SYNDROME-B EXTRACTABLE NUCLEAR ANTIBODY: SSB (La) (ENA) Antibody, IgG: <0.2 AI (ref <1.0)

## 2010-11-07 LAB — RHEUMATOID FACTOR: Rhuematoid fact SerPl-aCnc: 159 IU/mL — ABNORMAL HIGH (ref 0–20)

## 2010-11-07 LAB — JO-1 ANTIBODY-IGG: Jo-1 Antibody, IgG: <0.2 AI (ref <1.0)

## 2010-11-07 LAB — RPR: RPR Ser Ql: NONREACTIVE

## 2010-11-08 LAB — GLUCOSE, CAPILLARY
Glucose-Capillary: 167 mg/dL — ABNORMAL HIGH (ref 70–99)
Glucose-Capillary: 185 mg/dL — ABNORMAL HIGH (ref 70–99)
Glucose-Capillary: 214 mg/dL — ABNORMAL HIGH (ref 70–99)

## 2010-11-08 LAB — PROTIME-INR
INR: 1 (ref 0.00–1.49)
Prothrombin Time: 13.8 seconds (ref 11.6–15.2)

## 2010-11-08 LAB — APTT: aPTT: 39 seconds — ABNORMAL HIGH (ref 24–37)

## 2010-11-08 LAB — SYNOVIAL CELL COUNT + DIFF, W/ CRYSTALS: Neutrophil, Synovial: 82 % — ABNORMAL HIGH (ref 0–25)

## 2010-11-08 LAB — CBC
HCT: 31.2 % — ABNORMAL LOW (ref 36.0–46.0)
Hemoglobin: 10 g/dL — ABNORMAL LOW (ref 12.0–15.0)
Hemoglobin: 10.7 g/dL — ABNORMAL LOW (ref 12.0–15.0)
Hemoglobin: 11 g/dL — ABNORMAL LOW (ref 12.0–15.0)
MCHC: 33.9 g/dL (ref 30.0–36.0)
RBC: 3.68 MIL/uL — ABNORMAL LOW (ref 3.87–5.11)
RBC: 3.98 MIL/uL (ref 3.87–5.11)
RDW: 15.8 % — ABNORMAL HIGH (ref 11.5–15.5)
WBC: 12.6 10*3/uL — ABNORMAL HIGH (ref 4.0–10.5)
WBC: 14.3 10*3/uL — ABNORMAL HIGH (ref 4.0–10.5)
WBC: 8.8 10*3/uL (ref 4.0–10.5)

## 2010-11-08 LAB — DIFFERENTIAL
Basophils Absolute: 0 10*3/uL (ref 0.0–0.1)
Basophils Relative: 0 % (ref 0–1)
Basophils Relative: 0 % (ref 0–1)
Basophils Relative: 1 % (ref 0–1)
Eosinophils Absolute: 0.1 10*3/uL (ref 0.0–0.7)
Eosinophils Relative: 0 % (ref 0–5)
Lymphocytes Relative: 11 % — ABNORMAL LOW (ref 12–46)
Lymphocytes Relative: 17 % (ref 12–46)
Lymphocytes Relative: 9 % — ABNORMAL LOW (ref 12–46)
Lymphs Abs: 1.1 10*3/uL (ref 0.7–4.0)
Lymphs Abs: 2.3 10*3/uL (ref 0.7–4.0)
Monocytes Absolute: 1.1 10*3/uL — ABNORMAL HIGH (ref 0.1–1.0)
Monocytes Absolute: 1.1 10*3/uL — ABNORMAL HIGH (ref 0.1–1.0)
Monocytes Absolute: 1.3 10*3/uL — ABNORMAL HIGH (ref 0.1–1.0)
Monocytes Relative: 9 % (ref 3–12)
Monocytes Relative: 9 % (ref 3–12)
Neutro Abs: 10.4 10*3/uL — ABNORMAL HIGH (ref 1.7–7.7)
Neutro Abs: 10.8 10*3/uL — ABNORMAL HIGH (ref 1.7–7.7)
Neutro Abs: 10.8 10*3/uL — ABNORMAL HIGH (ref 1.7–7.7)
Neutrophils Relative %: 74 % (ref 43–77)
Neutrophils Relative %: 79 % — ABNORMAL HIGH (ref 43–77)
Neutrophils Relative %: 82 % — ABNORMAL HIGH (ref 43–77)

## 2010-11-08 LAB — COMPREHENSIVE METABOLIC PANEL
ALT: <8 U/L (ref 0–35)
Alkaline Phosphatase: 140 U/L — ABNORMAL HIGH (ref 39–117)
Alkaline Phosphatase: 92 U/L (ref 39–117)
CO2: 22 mEq/L (ref 19–32)
Chloride: 98 mEq/L (ref 96–112)
Creatinine, Ser: 0.93 mg/dL (ref 0.4–1.2)
GFR calc Af Amer: >60 mL/min (ref >60)
GFR calc non Af Amer: 45 mL/min — ABNORMAL LOW (ref >60)
GFR calc non Af Amer: >60 mL/min (ref >60)
Glucose, Bld: 478 mg/dL — ABNORMAL HIGH (ref 70–99)
Potassium: 4.3 mEq/L (ref 3.5–5.1)
Sodium: 129 mEq/L — ABNORMAL LOW (ref 135–145)
Sodium: 134 mEq/L — ABNORMAL LOW (ref 135–145)
Total Bilirubin: 0.6 mg/dL (ref 0.3–1.2)
Total Protein: 8.1 g/dL (ref 6.0–8.3)

## 2010-11-08 LAB — URINALYSIS, ROUTINE W REFLEX MICROSCOPIC
Bilirubin Urine: NEGATIVE
Glucose, UA: NEGATIVE mg/dL
Hgb urine dipstick: NEGATIVE
Ketones, ur: NEGATIVE mg/dL
Leukocytes, UA: NEGATIVE
Protein, ur: NEGATIVE mg/dL
Specific Gravity, Urine: 1.023 (ref 1.005–1.030)
Urobilinogen, UA: 1 mg/dL (ref 0.0–1.0)
pH: 6.5 (ref 5.0–8.0)

## 2010-11-08 LAB — EXPECTORATED SPUTUM ASSESSMENT W GRAM STAIN, RFLX TO RESP C

## 2010-11-08 LAB — BASIC METABOLIC PANEL
Calcium: 9 mg/dL (ref 8.4–10.5)
Creatinine, Ser: 0.89 mg/dL (ref 0.4–1.2)
GFR calc Af Amer: >60 mL/min (ref >60)
GFR calc Af Amer: >60 mL/min (ref >60)
GFR calc non Af Amer: >60 mL/min (ref >60)
GFR calc non Af Amer: >60 mL/min (ref >60)
Potassium: 4.5 mEq/L (ref 3.5–5.1)
Sodium: 131 mEq/L — ABNORMAL LOW (ref 135–145)
Sodium: 131 mEq/L — ABNORMAL LOW (ref 135–145)

## 2010-11-08 LAB — BODY FLUID CULTURE

## 2010-11-08 LAB — URIC ACID: Uric Acid, Serum: 3.5 mg/dL (ref 2.4–7.0)

## 2010-11-08 LAB — ANTI-SMITH ANTIBODY: ENA SM Ab Ser-aCnc: <0.2 AI (ref <1.0)

## 2010-11-08 LAB — CULTURE, BLOOD (ROUTINE X 2)
Culture: NO GROWTH
Culture: NO GROWTH

## 2010-11-08 LAB — URINE MICROSCOPIC-ADD ON

## 2010-11-08 LAB — LEGIONELLA ANTIGEN, URINE: Legionella Antigen, Urine: NEGATIVE

## 2010-11-08 LAB — CYCLIC CITRUL PEPTIDE ANTIBODY, IGG: Cyclic Citrullin Peptide Ab: 1567 U/mL — ABNORMAL HIGH (ref <7)

## 2010-11-08 LAB — SEDIMENTATION RATE: Sed Rate: 119 mm/hr — ABNORMAL HIGH (ref 0–22)

## 2010-11-08 LAB — MAGNESIUM: Magnesium: 1.7 mg/dL (ref 1.5–2.5)

## 2010-11-08 LAB — MYCOPLASMA PNEUMONIAE ANTIBODY, IGM: Mycoplasma pneumo IgM: 112 Units/mL (ref <770)

## 2010-11-08 LAB — KETONES, QUALITATIVE: Acetone, Bld: NEGATIVE

## 2010-11-08 LAB — ANTI-NUCLEAR AB-TITER (ANA TITER): ANA Titer 1: NEGATIVE

## 2010-11-11 ENCOUNTER — Encounter (HOSPITAL_COMMUNITY)
Admission: RE | Admit: 2010-11-11 | Discharge: 2010-11-11 | Disposition: A | Discharge status: Home / Self Care | Payer: Medicaid Other | Source: Ambulatory Visit | Attending: Orthopaedic Surgery | Admitting: Orthopaedic Surgery

## 2010-11-11 LAB — CBC
HCT: 35.5 % — ABNORMAL LOW (ref 36.0–46.0)
Hemoglobin: 11.7 g/dL — ABNORMAL LOW (ref 12.0–15.0)
MCH: 29.4 pg (ref 26.0–34.0)
MCV: 89.2 fL (ref 78.0–100.0)
RBC: 3.98 MIL/uL (ref 3.87–5.11)

## 2010-11-11 LAB — PROTIME-INR: Prothrombin Time: 11.9 seconds (ref 11.6–15.2)

## 2010-11-11 LAB — BASIC METABOLIC PANEL
Chloride: 105 mEq/L (ref 96–112)
GFR calc non Af Amer: 56 mL/min — ABNORMAL LOW (ref >60)
Glucose, Bld: 134 mg/dL — ABNORMAL HIGH (ref 70–99)
Potassium: 4.7 mEq/L (ref 3.5–5.1)
Sodium: 135 mEq/L (ref 135–145)

## 2010-11-11 LAB — SURGICAL PCR SCREEN: MRSA, PCR: NEGATIVE

## 2010-11-15 ENCOUNTER — Other Ambulatory Visit: Payer: Self-pay | Admitting: Orthopaedic Surgery

## 2010-11-15 ENCOUNTER — Ambulatory Visit (HOSPITAL_COMMUNITY)
Admission: RE | Admit: 2010-11-15 | Discharge: 2010-11-15 | Disposition: A | Discharge status: Home / Self Care | Payer: Medicaid Other | Source: Ambulatory Visit | Attending: Orthopaedic Surgery | Admitting: Orthopaedic Surgery

## 2010-11-15 DIAGNOSIS — Z01812 Encounter for preprocedural laboratory examination: Secondary | ICD-10-CM | POA: Insufficient documentation

## 2010-11-15 DIAGNOSIS — M799 Soft tissue disorder, unspecified: Secondary | ICD-10-CM | POA: Insufficient documentation

## 2010-11-15 LAB — GLUCOSE, CAPILLARY
Glucose-Capillary: 595 mg/dL (ref 70–99)
Glucose-Capillary: 468 mg/dL — ABNORMAL HIGH (ref 70–99)
Glucose-Capillary: 373 mg/dL — ABNORMAL HIGH (ref 70–99)

## 2010-11-21 NOTE — H&P (Signed)
  NAME:  [name] [name], [name] [name]                    ACCOUNT NO.:  [id number]  MEDICAL RECORD NO.:  [id number]           PATIENT TYPE:  O  LOCATION:  [facility]                         FACILITY:  [facility]  PHYSICIAN:  Vanita Panda. Magnus Ivan, M.D.DATE OF BIRTH:  [birth year]  DATE OF ADMISSION:  11/15/2010 DATE OF DISCHARGE:  11/15/2010                             HISTORY & PHYSICAL   CHIEF COMPLAINT:  Expanding right index finger mass.  HISTORY OF PRESENT ILLNESS:  [name] [name] is a pleasant 45 year old right- hand dominant female who was sent from Dr. Julieanne Manson from [facility] to evaluate her right index finger mass she has been growing since December, and it keeps growing. She said now it has gotten to be worse and painful.  It is directly over the index finger TIP joints where she points.  She denies any numbness or tingling in this area and or any injury to it.  PAST MEDICAL HISTORY: 1. Heart disease. 2. Diabetes. 3. Reflux. 4. Polysubstance abuse history. 5. History of stent placement.  SOCIAL HISTORY:  She reports that she is married.  She does smoke half a pack a day for the last 20 years.  She does not drink alcohol.  ALLERGIES:  No known drug allergies.  MEDICATIONS:  She has multiple medications including omeprazole, sertraline, NovoLog, Lantus, Reglan, pravastatin, tramadol, lisinopril, Plavix, metoprolol, nitroglycerin, Trexall, and folic acid.  REVIEW OF SYSTEMS:  Negative for chest pain, shortness of breath, fevers, chills, nausea, or vomiting.  PHYSICAL EXAMINATION:  VITAL SIGNS:  She is afebrile.  Stable vital signs. GENERAL:  She is alert and oriented x3, in no acute distress, but in obvious discomfort. HEENT:  Normocephalic and atraumatic. LUNGS:  Clear to auscultation bilaterally. HEART:  Regular rate and rhythm. ABDOMEN:  Benign. EXTREMITIES:  Right index finger shows expansile mass over the PIP joint that is in the tissue, she has  full range of motion, does not appear infectious in nature.  ASSESSMENT:  Expanding soft tissue mass, right index finger.  PLAN:  We are going to proceed to the operating room for excision of this mass.  We will sent it for pathology.  This will be performed as an outpatient.     Vanita Panda. Magnus Ivan, M.D.     CYB/MEDQ  D:  11/15/2010  T:  11/16/2010  Job:  [id number]  Electronically Signed by Doneen Poisson M.D. on 11/21/2010 10:55:18 PM

## 2010-11-21 NOTE — Op Note (Signed)
  NAME:  [name] [name], [name] [name]                    ACCOUNT NO.:  [id number]  MEDICAL RECORD NO.:  [id number]           PATIENT TYPE:  O  LOCATION:  [facility]                         FACILITY:  [facility]  PHYSICIAN:  Vanita Panda. Magnus Ivan, M.D.DATE OF BIRTH:  June 02, 1966  DATE OF PROCEDURE:  11/15/2010 DATE OF DISCHARGE:  11/15/2010                              OPERATIVE REPORT   PREOPERATIVE DIAGNOSIS:  Right index finger mass.  POSTOPERATIVE DIAGNOSIS:  Expansile right index finger soft tissue mass.  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  PROCEDURE:  Excision of right index finger soft tissue mass.  SURGEON:  Vanita Panda. Magnus Ivan, MD.  ANESTHESIA: 1. General. 2. Local digital block with 0.25% plain Sensorcaine.  BLOOD LOSS:  Minimal.  COMPLICATIONS:  None.  INDICATIONS:  [name] is a 45 year old female who noted an index finger mass on her right index finger over the PIP joint in December.  It has been slowly growing and it has now become painful too.  She wished to have this excised.  I obtained x-rays, did show this to be only involving the soft tissue.  I recommended she undergo excision.  We are sending this mass to Pathology.  She understands the risks and benefits of this and does wish to proceed with surgery.  DESCRIPTION OF PROCEDURE:  After informed consent was obtained, the appropriate right hand was marked.  She was brought to the operating room and placed supine on the operating table.  General anesthesia was then obtained.  Her right arm was placed on the arm table.  Her hand was prepped and draped with DuraPrep and sterile drapes.  A time-out was called to identify the correct patient and correct right index finger. I then made an incision directly over the soft tissue mass and was able to tee this out and excise directly with a solid appearing mass and it was closed with a flexor tendon, I removed this in its  entirety.  I then copiously irrigated the tissues and reapproximated the skin with interrupted 3-0 nylon suture.  I placed a digital block with 0.25% plain Sensorcaine as well.  Of note, I did feel like I got the entirety of the mass out.  The flexor tendon removed in its entirety after this and it did not appear to communicate with the joint.  This was passed off the table and was sent for permanent identification with Pathology. After sterile dressing was applied, the patient was awakened, extubated, and taken to the recovery room in stable condition.  Postoperatively, she will be discharged home.     Vanita Panda. Magnus Ivan, M.D.     CYB/MEDQ  D:  11/15/2010  T:  11/16/2010  Job:  [id number]  Electronically Signed by Doneen Poisson M.D. on 11/21/2010 10:55:20 PM

## 2010-12-13 ENCOUNTER — Emergency Department (HOSPITAL_COMMUNITY): Payer: Medicaid Other

## 2010-12-13 ENCOUNTER — Emergency Department (HOSPITAL_COMMUNITY)
Admission: EM | Admit: 2010-12-13 | Discharge: 2010-12-13 | Disposition: A | Discharge status: Home / Self Care | Payer: Medicaid Other | Attending: Emergency Medicine | Admitting: Emergency Medicine

## 2010-12-13 DIAGNOSIS — I2510 Atherosclerotic heart disease of native coronary artery without angina pectoris: Secondary | ICD-10-CM | POA: Insufficient documentation

## 2010-12-13 DIAGNOSIS — R5381 Other malaise: Secondary | ICD-10-CM | POA: Insufficient documentation

## 2010-12-13 DIAGNOSIS — R404 Transient alteration of awareness: Secondary | ICD-10-CM | POA: Insufficient documentation

## 2010-12-13 DIAGNOSIS — I252 Old myocardial infarction: Secondary | ICD-10-CM | POA: Insufficient documentation

## 2010-12-13 DIAGNOSIS — Z794 Long term (current) use of insulin: Secondary | ICD-10-CM | POA: Insufficient documentation

## 2010-12-13 DIAGNOSIS — E1169 Type 2 diabetes mellitus with other specified complication: Secondary | ICD-10-CM | POA: Insufficient documentation

## 2010-12-13 DIAGNOSIS — M25519 Pain in unspecified shoulder: Secondary | ICD-10-CM | POA: Insufficient documentation

## 2010-12-13 NOTE — Discharge Summary (Signed)
NAME:  [name] [name]                    ACCOUNT NO.:  [id number]   MEDICAL RECORD NO.:  [id number]          PATIENT TYPE:  INP   LOCATION:  2913                         FACILITY:  [facility]   PHYSICIAN:  [name] Dickens, MD     DATE OF BIRTH:  02/28/1966   DATE OF ADMISSION:  01/20/2009  DATE OF DISCHARGE:                               DISCHARGE SUMMARY   DISCHARGE DIAGNOSES:  1. Rheumatoid arthritis.  2. Diabetes.  3. Hypertension.  4. Hyperlipidemia.  5. Depression.  6. Tobacco abuse.  7. History of esophageal stricture, status post dilatation.  8. History of cardiovascular accident in 2000.  9. Remote history of cocaine abuse.  10.Remote history of alcohol abuse.   DISCHARGE MEDICATIONS:  1. Protonix 40 mg 1 tablet p.o. daily.  2. Zoloft 100 mg 1 tablet p.o. daily.  3. Tylenol Extra Strength 2 tablets p.o. q.4 h. as needed for      headache, not to exceed 8 tablets in 24 hours.  4. Sliding scale insulin.  5. Benazepril 10 mg 1 tablet p.o. every morning.  6. Lantus 45 units subcutaneously every evening.  7. Vistaril 25 mg 1 capsule by mouth every 8 hours as needed for      anxiety.  8. Reglan 10 mg 1 tablet p.o. prior to each meal and at bedtime.  9. Crestor 5 mg 1 tablet p.o. daily.  10.Tramadol 50 mg 1-2 tablets by mouth every 6 hours as needed for      pain.  11.Oxycodone and acetaminophen 5/325 mg 1 tablet p.o. every 4-6 hours      as needed for pain.  12.Pepcid 20 mg 1 tablet p.o. every 12 hours as needed.  13.Prednisone 10 mg 6 tablets by mouth for 2 days then decrease by 1      tablet every 2 days until you run out of medication.   CONDITION AT DISCHARGE:  The patient is medically stable to be  discharged.  The patient will be discharged on a prednisone taper.  The  patient will follow up at [facility] on February 02, 2009, with  [name] [name] at 1:30 p.m.  At follow up at [facility], the patient should  get started to get insurance through the Clinic with [name] [name].   After  health insurance is obtained, will schedule the patient to a  rheumatology consult.   PROCEDURE:  Chest x-ray.   IMPRESSION:  Minimal atelectasis with scar at the lung base has  significantly improved compared to Dec 06, 2008.  No new finding.   ADMITTING HISTORY AND PHYSICAL/HISTORY OF PRESENT ILLNESS:  A 45-year-  old woman with past medical history of multiple ED visits due to joint  pains and hospitalization for joint complaints in Dec 17, 2008, diabetes  1, comes to the ED stating that at 4:00 a.m. in the morning of  admission, her shoulder throws up and since then she cannot move her  left arm.  Associated with left shoulder pain, 10/10 intensity,  swelling, localize and left knee pain.  The patient states the left knee  became swollen day before admission during the evening and the left  shoulder became swollen day before admission earlier during the day.  Since Monday, the patient had noticed loss of range of motion that has  progressed until day of admission.  The patient started to have drug  problems on March of this year.  She reports that she gets flares when  it rains or when she is on a period.  Usually, joint pain lasts for 1  week and then subsides.  The patient has used tramadol and Tylenol  without much relief of pain.  The patient states that last month she had  wrist pain bilaterally and a month before that she had knee pain  bilaterally.  At that time, she got both knees tap.  No signs of septic  arthritis in studies done.   PHYSICAL EXAMINATION:  VITAL SIGNS:  Temperature 98, blood pressure  112/71, pulse 113, respiratory rate 16, and O2 saturation 100 room air.  GENERAL:  NAD.  EYES:  No scleral icterus.  ENT:  Moist mucosa.  NECK:  Supple.  No JVD.  RESPIRATORY:  Clear to auscultation.  No wheezes, rhonchi's, or  crackles.  CARDIOVASCULAR:  Tachycardic and regular.  No murmurs, rubs, or gallops.  GI:  Bowel sounds positive.  Nontender and rebound.   No guarding.  EXTREMITIES:  No edema.  MUSCULOSKELETAL:  Left arm, limited range of motion, secondary to pain.  Mild left shoulder swelling.  Mild left knee swelling.  NEUROLOGIC:  Alert and oriented x3.  Cranial nerves II through XII  intact.  Strength and motion of left arm could not be established due to  pain, otherwise 5/5 strength throughout, some numbness at the left hand  and thumb area, otherwise sensation intact.   ADMISSION LABORATORIES:  Sodium 128, potassium 5.6, chloride 96, bicarb  18, BUN 19, creatinine 1.06, and glucose 263.  White blood cells 15,  hemoglobin 12, hematocrit 35, and platelets 288.   HOSPITAL COURSE:  1. Rheumatoid arthritis with the patient's past medical history and      has evidence of positive anti-CCP, positive rheumatoid factor,      elevated CRP and ESR is suggestive of rheumatoid arthritis.  The      patient also has family history of rheumatoid arthritis.  The      patient has a negative urine GC and chlamydia.  Negative anti-DNA.      Negative anti-SCL/70.  Negative anti-Jo-1.  The patient has a      elevated anti-RO of 1.8.  The patient has a negative anti-LA.      Hepatitis panel was negative.  HIV antibody was nonreactive.      Anticentromere antibody was negative.  Syphilis was nonreactive.      After workup, the patient was thought to be medically stable to be      discharged.  The patient will be discharged on a prednisone taper.      The patient will follow up at [facility].  At follow      up appointment, the patient with thought process of her insurance.      After insurance is obtained, the patient will be sent to a      rheumatologist for further management of rheumatoid arteritis.  We      will have the patient to make an appointment with [name] [name] at      the Clinic.  2. Diabetes.  The patient's CBG ranged from 121-249.  The patient was      started on sliding scale insulin and ran this throughout       hospitalization.  The patient will be discharged on home dose      insulin.  The patient was told to keep checking CBGs at least 2-3      times a day and to bring meter with her at follow up appointment at      [facility].  At that time, further titration of      insulin will be done if needed.  3. Hypertension.  Stable throughout hospitalization.  The patient will      be discharged on home regimen and will continue to be followed up      at [facility].  4. Depression.  The patient will continue home dose regimen      antidepressants.  5. Hyperlipidemia.  In hospitalization, the patient's LDL was 63.  The      patient will continue taking statins.  We will continue to manage      as an outpatient.  6. Tobacco abuse.  The patient was given a nicotine patch during the      hospitalization.  Smoking cessation recommendations will continue      to be done as an outpatient.  7. Discharge vitals:  Temperature 97.8, blood pressure 124/70, pulse      102, respiratory rate 20, and O2 saturation 97 on room air.  8. Discharge laboratories:  Sodium 138, potassium 4.6, chloride 110,      bicarb 21, BUN 13, creatinine 0.83, and glucose 177.  White blood      cells 10.1, hemoglobin 8.9, hematocrit 27, and platelets 255.      Danne Harbor, MD  Electronically Signed      [name] Dickens, MD     RV/MEDQ  D:  01/20/2009  T:  01/21/2009  Job:  [id number]   cc:   [name] Dickens, MD

## 2010-12-13 NOTE — Discharge Summary (Signed)
NAME:  [name] [name], [name] [name]                    ACCOUNT NO.:  [id number]   MEDICAL RECORD NO.:  [id number]          PATIENT TYPE:  INP   LOCATION:  6704                         FACILITY:  [facility]   PHYSICIAN:  [name], M.D.    DATE OF BIRTH:  08-May-1966   DATE OF ADMISSION:  02/02/2007  DATE OF DISCHARGE:  02/05/2007                               DISCHARGE SUMMARY   DISCHARGE DIAGNOSES:  1. Left-sided weakness, most likely secondary to diabetes cortical      changes and or cocaine ischemic change.  2. Abnormal magnetic resonance imaging with lumbar puncture, cerebral      spinal fluid studies pending.  Primary results:  Ruling out      vasculitis and multiple sclerosis.  3. Diabetes.  4. Substance abuse.  5. Gastroesophageal reflux disease with strictures.   DISCHARGE MEDICATIONS:  1. Benazepril 5 mg p.o. daily.  2. Folic acid 1 mg p.o. daily.  3. Hydroxyzine 25 mg p.o. b.i.d.  4. Insulin glargine/Lantus 40 units subcutaneous q.h.s.  5. Metoclopramide 5 mg p.o. b.i.d.  6. Protonix 40 mg p.o. daily.  7. Zoloft 50 mg p.o. daily.  8. Zocor 20 mg p.o. daily.  9. Thiamine 100 mg p.o. daily.   DISPOSITION AND FOLLOWUP:  She has an appointment with her primary care  physician, Dr. Barbaraann Barthel, at [facility] on July 9, and she will be  discharged to [facility]   PROCEDURES PERFORMED DURING HER HOSPITALIZATION:  Include:  1. MRI of the head without contrast which was read as prominent      nonspecific white-type matter changes, no acute infarct.  2. MRI of the neck which is read as no evidence of hemodynamically      significant stenosis involving either carotid bifurcation portion      of distal vertebral artery not included on the exam.  3. MRI of brain with and without contrast which is read as mild      irregularity of medium and small-sized vessels, possibly      representing atherosclerotic changes with vasculitis narrowing of      cavernous segment of the internal carotid  arteries, right greater      than left, partially explained by artifact.   Impression was that there was no evidence of hemodynamically significant  stenosis involving either carotid bifurcation.  Portion of distal  vertebral artery was not included in the study.  All of these studies  were done on July 5.  On July 7, she had a lumbar puncture which showed  a total protein in the CSF of 28, total albumin less than 1, and 1 red  blood cell.  Further results on the CSF are pending.   There were no consultations.   She was admitted, as mentioned before, on July 5 with left-sided  weakness.  She had symptoms which started on July 4 at noon with left  leg pain and the thigh/hip area.  Leg pain increased.  By 4 p.m., she  was slurring her speech, had visual changes, and noticed left arm and  face weakness.  She continued to eat and then go to bed.  In the  morning, on July 5, she reported her symptoms to the staff at [facility].  They brought her to the emergency room.   EXAM ON ADMISSION:  VITAL SIGNS:  Her temperature was 98.1, blood  pressure 142/85, pulse was 102, respiration 22.  GENERAL:  She was alert and oriented x3.  HEENT:  Had pinpoint pupils which were minimally responsive to light.  He had slight lymphadenopathy on the right submandibular node area, poor  dentition, dry mucous membranes, and she had slurred speech.  Trachea  was midline.  There were no bruits.  LUNGS:  Respirations were 22.  Lungs were clear to auscultation with  good effort.  HEART:  Rate was regular rhythm and tachycardiac.  ABDOMEN:  Bowel sounds were present.  Abdomen was distended, was  slightly tender to palpation in both lower quadrants.  EXTREMITIES:  She had no edema, cyanosis, and good capillary refill.  She had bruise sites which she reported to be the sites of insulin  injections under her right side and right inner arm.  She had pain with  movement of her left hip.  No  crepitus, no erythema, no swelling.  NEURO:  She had minimal weakness in the left hand.  Strength 5/5 on all  other extremities.  Minimal droop of left side mouth.  Facial strength  symmetric.  Sensation intact throughout.  PSYCH:  She was tearful and denied suicidal ideation.   Her sodium was 136, potassium 4.7, chloride 101, bicarb 28, BUN 31,  creatinine 0.9, glucose was 159.  White blood cells 10.5, hemoglobin  11.4, hematocrit 34.3, platelets 375, an MCV of 84.2.  She had an ESR of  50.  AST was 27, protein 7.1, albumin was 3.0, calcium was 9.0.   HOSPITAL COURSE:  Problem:  1. During her hospital course, her complaints of left-sided weakness      were resolved.  This weakness is most likely secondary to      hypoglycemia, uncontrolled diabetes, or potentially from white      matter changes secondary to lifetime of chronic diabetes      uncontrolled and substance abuse.  2. Abnormal MRI as mentioned.  CSF studies are still pending.  3. Diabetes.  In the hospital, her CBGs ran between 11 to 400, and it      was controlled best on 40 units of Lantus once a day with no      sliding scale coverage.  4. Substance abuse.  During the hospital stay, it was not a problem.      She has been in rehabilitation for 1 month, and UDS was negative.      Blood alcohol level on this admission was negative.  5. Her gastroesophageal reflux disease was not a problem.  It was      controlled with Protonix while in the hospital.   She is being discharged today.  Her current labs are sodium 133,  potassium 4.5, chloride 104, pO2 bicarb 23, and glucose 338, BUN 28,  creatinine 0.9, calcium 8.9.  White blood cells 6.4, hemoglobin 9.9,  hematocrit 30.1, MCV of 83.3, platelet of 308.   Her vitals from this morning, morning of admission:  Temperature 98.6,  pulse 100, respirations 20, systolic blood pressure 119, diastolic blood  pressure 64, O2 sat 100.      Elby Showers, MD  Electronically  Signed      [name],  M.D.  Electronically Signed    CW/MEDQ  D:  02/05/2007  T:  02/05/2007  Job:  [id number]   cc:   Fanny Dance. Rankins, M.D.

## 2010-12-13 NOTE — H&P (Signed)
NAME:  [name] [name], [name] [name]                    ACCOUNT NO.:  [id number]   MEDICAL RECORD NO.:  [id number]          PATIENT TYPE:  INP   LOCATION:  5003                         FACILITY:  [facility]   PHYSICIAN:  Manus Gunning, MD      DATE OF BIRTH:  1966-06-03   DATE OF ADMISSION:  12/05/2008  DATE OF DISCHARGE:                              HISTORY & PHYSICAL   CHIEF COMPLAINT:  Jaw clenching x1 day with cough and fever.   HISTORY OF PRESENT ILLNESS:  Ms. Radwan is a pleasant 45 year old African  American female who presents with the above noted complaints of jaw  tightness, cough along with black and brown expectoration.  In the  emergency department, her temperature was 102 degrees Fahrenheit with  leukocytosis of approximately 14,300.  A chest x-ray demonstrates  atelectasis, bilateral bases versus a pneumonia.  She also has  complaints of generalized arthritic pains in her left shoulder, left  hip, left knee and left ankle.  Interestingly enough, she claims that on  Tuesday she had these symptoms on the right side.  She claims that she  has had chronic arthritis which is fleeting from joint to joint for  approximately 2 years now and largely involves her larger joints.  In  regards to her jaw trismus, she claims that this started today in the  afternoon and this is one of the reasons she was prompted to present to  the emergency department.  Her ESR is elevated at 119.  Her UA  demonstrates poor collection, multiple squamous cells with leukocyte  positive and multiple wbc's, and he creatinine is 0.9.  Her T-max at the  time of presentation as dictated above, was 102 degrees Fahrenheit.   She complains of headaches.  No blurring of vision, no tinnitus, no loss  of consciousness, no syncope, no presyncope.  She does have a history of  odynophagia and dysphagia with multiple esophageal dilatations in the  past.  There is no change in arthritic component with cold.  She denies  any changes in her  skin color with hot and cold temperatures.  She  denies any chest pain, palpitations, PND or orthopnea.  Positive cough.  Positive brown black expectoration. No dyspnea on exertion.  No sob. No  abdominal pain.  No diarrhea or constipation, no dysuria or polyuria,  hematuria or bright red blood per rectum; no melenic stools.  Multiple  musculoskeletal complaints mainly affecting the joints as dictated  above.   PAST MEDICAL/SURGICAL HISTORY:  1. Diabetes mellitus type 2.  2. Depression.  3. Anxiety.  4. Esophageal stricture status post dilatation.   ALLERGIES:  CODEINE.   SOCIAL HISTORY:  One pack per day x28 years, now smokes 10 cigarettes  per day.  Denies illicits or alcohol.  Claims she quit alcohol,  tetrahydrocannabinols and cocaine approximately 2 years ago.   FAMILY HISTORY:  Patient cannot recall.   HOME MEDICATIONS:  1. Lantus 38 units subcu q.h.s.  2. Zoloft 100 mg daily.  3. Benazepril 10 mg daily.  4. Vistaril 25 mg t.i.d.  5. Crestor  10 mg h.s.  6. NovoLog subcu daily.  7. Protonix 40 mg daily.  8. Reglan 10 mg a.c. and h.s.   REVIEW OF SYSTEMS:  A 14-point review of systems is performed. Pertinent  positives and negatives as described above.   PHYSICAL EXAMINATION:  VITAL SIGNS:  At the time of presentation,  temperature 102, heart rate 136, respiratory rate 22, blood pressure  135/85, O2 saturation is 98% in room air.  GENERAL:  Well-developed, well-nourished African American lady lying in  bed comfortably in no apparent distress.  HEENT:  Normocephalic, atraumatic.  Moist oral mucosa.  No thrush,  erythema or postnasal drip.  Eyes anicteric.  Extraocular muscles are  intact.  Pupils are equal, react to light and accommodation.  NECK:  Supple, good range of motion.  No thyromegaly.  Flat neck veins.  CARDIOVASCULAR:  S1 and S2 normal, regular rate and rhythm.  No murmurs,  rubs or gallops.  RESPIRATORY:  Air entry bilaterally equal.  Coarse breath  sounds  diffusely across the chest, lots of end-inspiratory wheezes. No rhonchi  appreciated, no rales.  ABDOMEN:  Soft and nontender.  Nondistended.  Positive bowel sounds. No  organomegaly.  EXTREMITIES:  No clubbing, cyanosis, or edema.  Positive bilateral  dorsalis pedis pulses.  CNS:  Alert and oriented x3.  Cranial nerves II through XII grossly  intact.  Power, sensation and reflexes bilaterally symmetrical.  SKIN:  No breakdown, swelling, ulceration or masses.  HEMATOLOGY/ONCOLOGY:  No palpable lymphadenopathy, ecchymoses, bruising  or petechia.   LABORATORY DATA:  ESR 119.  Urinalysis with 7-10 wbc's, 0-2 rbc's with  urine color yellow, nitrite negative, leukocytes moderate with few  squamous epithelial cell.  Sodium 131, potassium 4.5, chloride 102, CO2  22, glucose 132, BUN 16, creatinine 0.9.  Calcium 8.9.  WBC 14,300,  hemoglobin 10.7. hematocrit of 31.2, platelet count 313,000, polymorphs  76%.   ASSESSMENT AND PLAN:  1. Cough and fever with leukocytosis, most likely a pneumonia.  Start      Rocephin 1 gram every 24 hours, Zithromax 500 mg p.o. daily.  Check      blood culture, sputum cultures, and urine culture and sensitivity,      recheck chest x-ray.  IV fluids normal saline at 125 cc/hour.  Due      to her multiple arthritic complaints, also check urinary Legionella      antigen and mycoplasma IgM antibody which can cause multiple      arthritic complaints.  Start patient on nebulizers, Xopenex after      nebulizer treatment q.6 hours scheduled and q.2 hours p.r.n.  2. Arthralgias.  Etiology of this is uncertain.  Check ANA, ESR, CRP,      rheumatoid factor and uric acid.  X-ray of the left shoulder,      bilateral hips, left knee and left ankle.  Also check 2-D      echocardiogram as this a wandering arthralgia, also check rapid      strep antigen as infections with streptococcus can result in      similar complaints.  3. Trismus.  Whether this is secondary to  rheumatologic disease or      medications, will hold Reglan, hold Zoloft, hold Crestor and check      CPK.  Check Panorex and TMJ.  4. Gastrointestinal and deep vein thrombosis prophylaxis. Protonix 40      mg p.o. daily and heparin 5000 units      subcutaneously q.8 hours.  5.  Diabetes mellitus, type 2.  Check hemoglobin A1c, fasting lipid      profile, place on sliding scale insulin.  Continue home dose of      insulin.      Manus Gunning, MD  Electronically Signed     SP/MEDQ  D:  12/06/2008  T:  12/06/2008  Job:  [id number]

## 2010-12-13 NOTE — Discharge Summary (Signed)
NAME:  [name] [name], [name] [name]                    ACCOUNT NO.:  [id number]   MEDICAL RECORD NO.:  [id number]          PATIENT TYPE:  INP   LOCATION:  5003                         FACILITY:  [facility]   PHYSICIAN:  Charlestine Massed, MDDATE OF BIRTH:  1966-01-20   DATE OF ADMISSION:  12/05/2008  DATE OF DISCHARGE:  12/07/2008                               DISCHARGE SUMMARY   PRIMARY CARE PHYSICIAN:  Turkey R. Rankins, MD   RHEUMATOLOGY:  Aundra Dubin, MD   REASON FOR ADMISSION:  Pain in the mouth and pain in the right shoulder  and left shoulder and ankle.   DISCHARGE DIAGNOSES:  1. Migratory polyarthritis of unknown cause.  2. Bronchitis versus pneumonia with features of clinical pneumonia.  3. Depression.  4. Tobacco abuse.  5. Diabetes, type 2.  6. History of esophageal stricture, status post dilatation, currently      stable.   DISCHARGE MEDICATIONS:  1. Ambien 5 mg p.o. nightly p.r.n. for sleeplessness.  2. Naproxen 250 mg p.o. b.i.d. with meals for 2 weeks.  3. Vantin 200 mg p.o. b.i.d. for 4 days.  4. Zithromax 500 mg p.o. daily for 4 days.   Existing medications to be continued are:  1. Lantus 38 units subcutaneously nightly.  2. Zoloft 100 mg p.o. daily.  3. Benazepril 10 mg p.o. daily.  4. Crestor 10 mg p.o. nightly.  5. NovoLog pen subcutaneously q.a.c. nightly as coverage.  6. Protonix 40 mg p.o. daily.  7. Reglan 10 mg p.o. p.r.n.   Medication that was stopped is Vistaril.   HOSPITAL COURSE BY PROBLEM:  1. Pneumonia/bronchitis.  The patient came in with complaints of fever      with cough with sputum production.  She has had the home      temperature of 102 degrees.  She was seen in the hospital.  Chest x-      ray revealed questionable pneumonia versus atelectasis.      Clinically, there were some few coarse rales on the right side.      She did not have much of the sputum production, but she had cough,      so diagnosis was more in favor of bronchitis than  pneumonia, so the      patient was started on IV antibiotics, ceftriaxone and      azithromycin.  She had 2 days of antibiotics already.  Currently,      clinically she is very stable.  No cough, no fevers, and she is      very well active, and ambulatory so she is being discharged back      and active, and her oxygen saturations are stable, so she is being      discharged back home on oral antibiotics for the next 4 days on      Vantin and Zithromax.  2. Migratory polyarthritis.  The patient was evaluated here.  She had      arthritis kind of pain and swelling in the right knee, the left      ankle, left shoulder, and the left temporomandibular  joints.  She      had pain and she was unable to open her jaw very well because of      the pain.  She was started on naproxen, and she had a very good      relief with that and she was able to take her food very well.  All      her pains resolved.  She had a recent study in the hospital when      she had knee aspirate in the right knee, which did not reveal any      organisms and the cell count at that time was 15,335 with 82%      neutrophils and cloudy appearance and no crystals were seen in the      study, so this excludes crystal deposition disease such as gout or      pseudogout in the picture.  Also, she has polyarthritis, which is      not typical of gout.  She had other investigations done during this      admission, which includes ANA.  ANA was positive but the titer was      negative and was less than 1:40.  Also other tests suggest anti-      ribonucleoprotein was less than 0.2 and anti-Smith antibody was      also negative less than 0.2.  The patient's exact reason for the      migratory polyarthritis is not well known.  She said she has a      flare of this arthritis coming every time during her cycles and      right now she is having her cycle when she had all this pain.  She      is currently placed on naproxen.  She has been  advised to follow up      with Dr. Kellie Simmering at [phone] within the next 2 weeks for further      rheumatological workup and management.  3. Depression, to continue Zoloft.  She is very emotional and crying      yesterday.  Currently, her mental status is stable and there are no      issues identified.  She is not suicidal at this time.  4. Diabetes.  Her blood sugars were stable.  We will continue on      Lantus and NovoLog coverage and diabetic diet.  5. Hypertension.  We will continue Lotensin at 10 mg p.o. daily at      this time.  She does not have any issues with blood pressure.      Blood pressure is stable, so she is being discharged.  6. Tobacco abuse.  She has decided that she will see her primary care      doctor and asked for Chantix for further stopping of her smoking.      The patient has been educated very well.  I spoke to her for almost      10 minutes about smoking cessation and the harmful effects of      smoking.  She exhibited understanding.   DISPOSITION:  Discharged back home.   FOLLOWUP:  1. Follow up with Dr. Benetta Spar Rankins in the next 1 week.  2. Follow up with Dr. Kellie Simmering within the next 2 weeks.   A total of 40 minutes was spent on this discharge.      Charlestine Massed, MD  Electronically Signed     UT/MEDQ  D:  12/07/2008  T:  12/08/2008  Job:  [id number]   cc:   Fanny Dance. Rankins, M.D.  Aundra Dubin, M.D.

## 2010-12-13 NOTE — Cardiovascular Report (Signed)
NAME:  [name] [name], [name] [name]                    ACCOUNT NO.:  [id number]   MEDICAL RECORD NO.:  [id number]          PATIENT TYPE:  EMS   LOCATION:  MAJO                         FACILITY:  [facility]   PHYSICIAN:  Corky Crafts, MDDATE OF BIRTH:  Sep 15, 1965   DATE OF PROCEDURE:  02/21/2009  DATE OF DISCHARGE:  02/12/2009                            CARDIAC CATHETERIZATION   REFERRING PHYSICIAN:  Turkey R. Rankins, MD   PROCEDURE PERFORMED:  Coronary angiogram.   INDICATIONS FOR PROCEDURE:  Inferior ST elevation MI.   OPERATOR:  Corky Crafts, MD   PROCEDURE/NARRATIVE:  The risks and benefits of catheterization were  briefly explained to the patient and informed consent was obtained.  She  was brought to the cath lab.  She is prepped and draped in the usual  sterile fashion.  Her left groin was infiltrated with 1% lidocaine.  A 6-  French sheath was placed into the left femoral artery using modified  Seldinger technique.  Left coronary artery angiography was performed  using a JL-4.0 pigtail catheter.  The catheter was advanced to the  vessel ostium under fluoroscopic guidance.  Digital angiography was  performed in multiple projections using hand injection of contrast.  Right coronary artery angiography was performed using a JR-4 guiding  catheter.  The catheter was advanced to the vessel ostium under  fluoroscopic guidance.  Digital angiography was performed in multiple  projections using hand injection contrast.  The sheath was removed using  manual compression.   FINDINGS:  The left main was widely patent.   The circumflex is a small vessel with moderate-to-severe diffuse disease  in the midportion of the vessel.  There is TIMI III flow in this vessel.  There is a large OM1, which also had TIMI III flow and no significant  stenosis.  The left anterior descending was a large vessel, which  wrapped around the apex.  There are mild luminal irregularities  throughout.  The first  diagonal had an ostial 40% stenosis.   The right coronary artery had widely patent stents.  There was some  evidence of vasospasm, which resolved with nitroglycerin.  There is an  ostial 80% stenosis in the PDA with normal flow and posterolateral  artery is small and patent.   IMPRESSION:  1. ST elevation likely secondary to spasm of the right coronary      artery, prior stents are widely patent.  2. Continued diffuse disease in the left circumflex system with no      change compared to prior study a couple days ago.   RECOMMENDATIONS:  The patient will continue with medical therapy.  Will  try to wean off the dopamine as this may be worsening spasm.      Corky Crafts, MD  Electronically Signed     JSV/MEDQ  D:  02/23/2009  T:  02/24/2009  Job:  [id number]

## 2010-12-13 NOTE — Cardiovascular Report (Signed)
NAME:  [name] [name], [name] [name]                    ACCOUNT NO.:  [id number]   MEDICAL RECORD NO.:  [id number]          PATIENT TYPE:  INP   LOCATION:  2913                         FACILITY:  [facility]   PHYSICIAN:  Corky Crafts, MDDATE OF BIRTH:  1966/01/29   DATE OF PROCEDURE:  01/20/2009  DATE OF DISCHARGE:                            CARDIAC CATHETERIZATION   REFERRING PHYSICIAN:  Turkey R. Rankins, MD   PROCEDURES:  1. Left heart catheterization.  2. Coronary angiogram.  3. Left ventriculogram.  4. PCI of the RCA.  5. Temporary pacemaker placement.   OPERATOR:  Corky Crafts, MD   INDICATIONS:  Inferior ST elevation MI and complete heart block.   PROCEDURE NARRATIVE:  The patient was brought emergently to the cath  lab.  She was prepped and draped in the usual sterile fashion.  Her  right groin was infiltrated with 1% lidocaine.  A 6-French sheath was  placed into the right femoral vein using the modified Seldinger  technique.  A 6-French sheath was then placed into the right femoral  artery using the modified Seldinger technique.  Left coronary artery  angiography was performed using a JL-4.0 catheter.  The catheter was  advanced to the vessel ostium under fluoroscopic guidance.  Digital  angiography was performed in multiple projections using hand injection  of contrast.  A temporary wire was then advanced to the right ventricle  and the pacemaker was tested.  The pacer did activate during the  catheterization.  A JR-4 guiding catheter was then placed in the ostium  of the right coronary artery.  A Prowater wire was used for the  intervention.  Angiomax was used for anticoagulation.  Please see below  for further details.  The sheath will be removed using manual  compression.   FINDINGS:  The left main coronary artery is widely patent.   The left circumflex is a medium-sized vessel.  There is a long diffuse  70% lesion in the proximal circumflex extending into the OM-1.   The OM-1  is a large vessel with proximal disease.   The left anterior descending is a large vessel with mild luminal  irregularities.  The first diagonal is a medium-sized vessel.  This  appears widely patent with mild irregularities.  The ostium is not well  seen.   The right coronary artery is a dominant vessel.  There is a 70%  midvessel lesion.  The distal RCA is occluded..  There appears to be a  90% PDA lesion as well, but this is a small vessel.  This could only be  seen after the distal RCA was opened.   PCI NARRATIVE:  A JR-4 guiding catheter was used.  A Prowater wire was  placed across the lesion in distal RCA.  A 2.0 x 12 apex balloon was  then advanced to the distal RCA and inflated to 6 atmospheres for 26  seconds and then again to 12 atmospheres for 30 seconds and then 27  seconds.  There was restoration of flow at this point.  A 2.0 x 12  MiniVision stent  was advanced across the ballooned area and deployed at  12 atmospheres for 37 seconds.  The stent was postdilated with a 2.25 x  8 mm Hugo Quantum apex balloon to 16 atmospheres for 37 seconds and more  proximally for 25 seconds.  The mid RCA disease was then directly  stented with a 2.5 x 28 MiniVision stent deployed at 9 atmospheres for  35 seconds.  The stented area was postdilated with 2.5 x 20 Quantum  apex, inflated at 16 atmospheres for 20 seconds and again more  proximally for 20 seconds.  There is an excellent angiographic result  with no residual stenosis.  The ostium of the PDA still had a tight  lesion, but there was normal flow through this vessel.   The left ventriculogram showed basal of mid inferohypokinesis.  The  estimated ejection fraction is 50%.   HEMODYNAMICS:  Left ventricular pressure is 73/11 with an LVEDP of 20  mmHg.  Aortic pressure is 73/39 with a mean aortic pressure of 52 mmHg.   IMPRESSION:  1. Acute inferior ST-segment elevation myocardial infarction.  2. Diffuse left circumflex  disease.  3. Ejection fraction of 50%.  4. No aortic valve gradient.  The patient was mildly hypotensive      throughout the case, but maintained her ability to [facility] and make      urine.   RECOMMENDATIONS:  Continue aspirin and Plavix for at least 30 days post  MI.  We will continue her statin and recommend smoking cessation, when  her blood pressure is better, we will add a beta-blocker.      Corky Crafts, MD  Electronically Signed     JSV/MEDQ  D:  01/20/2009  T:  01/21/2009  Job:  [id number]

## 2010-12-13 NOTE — Discharge Summary (Signed)
NAME:  [name] [name], [name] [name]                    ACCOUNT NO.:  [id number]   MEDICAL RECORD NO.:  [id number]          PATIENT TYPE:  INP   LOCATION:  2039                         FACILITY:  [facility]   PHYSICIAN:  Corky Crafts, MDDATE OF BIRTH:  27-Sep-1965   DATE OF ADMISSION:  01/20/2009  DATE OF DISCHARGE:  01/29/2009                               DISCHARGE SUMMARY   DISCHARGE DIAGNOSES:  1. Right ventricular infarct.  2. Renal insufficiency, now back to baseline, transient elevation.  3. Diabetes mellitus.  4. Chronic anemia.  5. Leukocytosis, felt reactive.  6. Transient Rosch-grade atrioventricular block, resolved.  7. Hypotension, pressor support, now normalized.  8. Smoker, smoking cessation counseling.   HOSPITAL COURSE:  Ms. Hollar is a 45 year old female, who noted around 8  p.m. on the day of admission, shortness of breath and chest pain.  She  called her sister and her sister went to her house, and the patient  opened the door, but apparently she was found on the floor with slurred  speech.  The family was then concerned that she might have fallen down  the steps.  Her speech is cleared up in the emergency room as well as  the chest pain and shortness of breath.   The CT of the head and neck was negative for acute bleed or fracture.  EKG showed sinus rhythm with ventricular rate of 66 beats per minute.  She is in atrial rhythm in the ranges of 80-90 beats per minute.  She  was in complete heart block with a junctional escape.  She had inferior  ST elevations in II, III, and F with at least 1 mm with reciprocal  changes laterally and subtle ST depressions in V1 and V3.  The patient  was given 325 mg of aspirin and then taken to the cath lab.   In the cath lab, she was found to have a 70% proximal circumflex lesion.  The LAD showed mild luminal irregularities.  Right coronary artery had  70% midvessel stenosis with a distal vessel occluded.  Once the RCA was  opened, there appeared  to be a 90% distal PDA lesion.  A Mini Vision  stent was deployed into the area without problem.   The patient was mildly hypertensive throughout the case.  Because of her  bradycardia, we had put in a temporary pacing wire.  After several days,  we were able to bring the pacing wire out and her AV block completely  cleared up and we were then able to start a beta-blocker, low dose.   She did have some problems with blood pressure and eventually was weaned  off her dobutamine.  At one point, she was brought back to the cardiac  catheterization lab for ischemia on her EKG.  She had acute inferior ST-  segment elevation with hypoxia.  When she was taken back to the cath  lab, she was stable.  We did give intracoronary nitroglycerin to the  right coronary artery and this seemed to pump it up.  We felt this was  just an involving  stage of her RV infarct.   Over the next several days, we monitored her very closely and watched  since her creatinine normalized.  With no further AV block, she started  on beta-blocker and by the time she is being discharged, we were adding  an ACE inhibitor.   We will have her follow up in the office next week for a visit and to  check her BUN and creatinine.   LABORATORY WORK:  Sodium 136, potassium 4.1, BUN 15, and creatinine  1.04.  Last BNP at 1115, which was down significantly from previous  BNPs.  The patient does not appear to be in heart failure clinically,  maximum troponin of 22.84.  Last chest x-ray on January 26, 2009, showed  some bibasilar edema.   DISCHARGE MEDICATIONS:  1. Ambien 1 nightly p.r.n. sleep.  2. Lantus as before.  3. Insulin as before.  4. Zoloft 100 mg a day.  5. Lisinopril 5 mg a day.  6. Protonix 40 mg a day.  7. Reglan as needed.  8. Enteric-coated aspirin 325 mg a day.  9. Plavix 75 mg a day.  10.Lopressor 25 mg one-half tablet p.o. b.i.d.   She is to remain on a low-sodium heart-healthy diabetic diet.  Clean  cath site  gently with soap and water.  No scrubbing.  Increase activity  slowly.  No lifting over 10 pounds.  Follow up with Dr. Eldridge Dace on February 02, 2009, at 9:30 a.m. at this point, she will go to the lab 30 minutes  before her visit to have a BMET run to make sure that her creatinine has  remained stable, now that we are going to put her on ACE inhibitor.      Guy Franco, P.A.      Corky Crafts, MD  Electronically Signed    LB/MEDQ  D:  01/29/2009  T:  01/29/2009  Job:  [id number]   cc:   Melvern Banker  Alford Highland. Rankin, M.D.

## 2010-12-13 NOTE — Discharge Summary (Signed)
NAME:  [name] [name], [name] [name]                    ACCOUNT NO.:  [id number]   MEDICAL RECORD NO.:  [id number]          PATIENT TYPE:  INP   LOCATION:  2039                         FACILITY:  [facility]   PHYSICIAN:  Corky Crafts, MDDATE OF BIRTH:  10-03-1965   DATE OF ADMISSION:  01/20/2009  DATE OF DISCHARGE:  01/29/2009                               DISCHARGE SUMMARY   ADDENDUM   MEDICATIONS:  The rest of her medications include Crestor 1 tablet  daily.  Apparently, she was taking this before her heart attack.      Guy Franco, P.A.      Corky Crafts, MD  Electronically Signed    LB/MEDQ  D:  01/29/2009  T:  01/29/2009  Job:  [id number]

## 2010-12-13 NOTE — H&P (Signed)
NAME:  [name] [name], [name] [name]                    ACCOUNT NO.:  [id number]   MEDICAL RECORD NO.:  [id number]          PATIENT TYPE:  INP   LOCATION:  2913                         FACILITY:  [facility]   PHYSICIAN:  Darryl D. Prime, MD    DATE OF BIRTH:  10/06/1965   DATE OF ADMISSION:  01/20/2009  DATE OF DISCHARGE:                              HISTORY & PHYSICAL   The patient is full code.   PRIMARY CARE PHYSICIAN:  Turkey R. Rankins, MD.   RHEUMATOLOGIST:  Aundra Dubin, MD.   CHIEF COMPLAINT:  Chest pain, shortness of breath and I fell.   HISTORY OF PRESENT ILLNESS:  [name] [name] is a 45 year old female.  She has  a history of diabetes, depression, anxiety, history of esophageal  stricture, status post dilatation and she had a recent problem with jaw  trismus, very elevated rheumatoid factor and sed rate with concern for  migrating polyarthritis back in May 2010.  She notes around 8:00 p.m.  shortness of breath and chest pain.  She was home alone upstairs and  called her sister.  Sister came to the door but they were locked.  Eventually, the patient opened the door, but she was found on the floor  with slurred speech.  They were concerned that she fell down the stairs.  The patient's speech has since cleared up in the emergency room, as well  as the chest pain and shortness of breath.  She came in a cervical  collar.  The patient has a CT of the head and neck that was negative for  acute bleed or fracture.  An EKG showed sinus rhythm at a vent rate of  66 beats per minute.  This is the ventricular rhythm.  She had an atrial  rhythm in the range of 80-90 beats per minute.  She was in complete  heart block with a junctional escape.  Her QRS was 80 milliseconds, QT  corrected 427.  She had inferior ST elevations II, III and aVF at least  1 mm with reciprocal changes laterally and subtle ST depressions mildly  in V2 and V3.  The patient was given aspirin 324 and then taken to the  cath lab.  The  patient's past medical history and past surgical history is as  above.  For her diabetes, she is on insulin.  She has a history of  gastroesophageal reflux disease and a history of tobacco abuse  longstanding.  She has a history of hyperlipidemia.  She has a history  of polysubstance abuse, but denies any THC use, cocaine use or alcohol  use over the last 2 years.   SOCIAL HISTORY:  As above.  She has smoked for last 28 years one pack a  day.   FAMILY HISTORY:  She could not recall any coronary artery disease in the  family.   REVIEW OF SYSTEMS:  Could not be obtained due to the patient's acuity  and she hysterical in the emergency room and not able to answer all of  our questions.   PHYSICAL EXAMINATION:  VITAL SIGNS:  The patient is afebrile with a  blood pressure of 107/50, pulse of 54.  At the time of her vital signs  check here now, respiratory rate of 28.  Saturations are 100% on 2  liters nasal cannula.  HEENT:  Normocephalic, atraumatic.  Her pupils  are equal, round and reactive to light.  Extraocular movements intact.  The oropharynx did show poor dentition with no posterior pharyngeal  lesions.  NECK:  She had a C-collar in place and unable to examine the  neck in detail.  LUNGS:  Clear to auscultation bilaterally.  CARDIOVASCULAR:  Regular rhythm and rate.  There is no apparent murmur,  but the heart sounds were very distant.  ABDOMEN:  Obese, soft, nontender, nondistended with no  hepatosplenomegaly.  EXTREMITIES:  Show no clubbing, cyanosis or edema.  NEUROLOGIC:  She is moving all extremities well.  Her sensation appears  grossly intact.  The patient's neurologic exam, she is alert and  oriented x4 with cranial nerves II-XII grossly intact.  PSYCHIATRIC:  She is alert and oriented x4.  She was apparently  hysterical in the emergency room, but did calm down after receiving  Dilaudid.   LABORATORY DATA:  Pending.  She has had recent labs drawn as she was   hospitalized for again polyarthralgias as above.  The patient's  laboratory data then was dated January 14, 2009, and showed a white count  of 10, hemoglobin of 8.9, platelets 255, segs of 75%.  The patient's HCG  pregnancy test urine was negative.  At that time, BMET showed a sodium  of 138, potassium of 4.6, chloride 110, CO2 of 21, glucose 177, BUN 13,  creatinine 0.8, calcium of 8.5, glucose here in the ER is 189.  The  patient's EKG is as above.  CT of the head and neck at initial negative  as above.   ASSESSMENT/PLAN:  This is a patient with a history of diabetes and  tobacco abuse who is now having inferior ST elevation myocardial  infarction with complete heart block.  She did fall and the fall could  have been related to the complete heart block.  At this time, she will  be taken emergently to the cath lab.  She will receive anticoagulation  and antiplatelet therapy there.  The patient was advised to discontinue  smoking.  Deep venous thrombosis and gastrointestinal will be ordered.  We did discuss her plan of care extensively with the sister who is here  with her and [name] and she did sign consent for [place].      Darryl D. Prime, MD  Electronically Signed     DDP/MEDQ  D:  01/20/2009  T:  01/21/2009  Job:  [id number]

## 2010-12-16 NOTE — H&P (Signed)
NAME:  [name] [name], Arrow                    ACCOUNT NO.:  [id number]   MEDICAL RECORD NO.:  [id number]          PATIENT TYPE:  EMS   LOCATION:  MAJO                         FACILITY:  [facility]   PHYSICIAN:  Adrian Blackwater, MDDATE OF BIRTH:  27-Oct-1965   DATE OF ADMISSION:  05/15/2004  DATE OF DISCHARGE:                                HISTORY & PHYSICAL   CHIEF COMPLAINT:  Altered mental status and abdominal pain.   HISTORY OF PRESENT ILLNESS:  This is a 45 year old African American female  with a history of insulin-dependent diabetes mellitus with multiple  admissions for DKA noncompliant with treatment, not with diet, and also with  polysubstance abuse history, and pancreatitis/gastritis. The patient states  on Friday she went to [facility] because she had ran out of medication.  There she received her insulin dose of 50 units NPH 25 units a.m. and 25  units p.m. On Saturday, she just got half of her doses and was not careful  with her diet. She also has a history of cocaine use on Wednesday, no  alcohol consumption.  She has been sick with a cough for the last week.  Yesterday, she started complaining of abdominal pain, diffuse and mild, with  headache, back pain, and vomited several times small amounts of water and  __________. She came to the ED this morning, but left because it was too  busy, and was brought back by her sister with increasing abdominal pain. She  was seen in the afternoon by ED physician and was started on IV fluids. The  history was given by patient and her sister.   PAST MEDICAL HISTORY:  1.  Insulin-dependent diabetes mellitus, multiple episodes of DKA.  2.  Hypertension.  3.  GERD/gastritis/PUD.  4.  History of alcohol abuse, on detoxification for the last two years.  5.  Polysubstance abuse.  6.  Pancreatitis (alcoholic).  7.  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   ALLERGIES:  CODEINE.   FAMILY HISTORY:  Mother died of PCP-lung  cancer. Also had diabetes mellitus.  Father had CVA.  One sister has breast cancer and another sister has gastric  cancer.   PAST SURGICAL HISTORY:  Not record at this moment.   SOCIAL HISTORY:  The patient lives with two sisters and her daughter who is  73 years old.  Reported no alcohol consumption during the last two years.  Positive smoking and use of drugs, cocaine.   MEDICATIONS:  1.  Insulin NPH 50 units a day, 25 units in the a.m. and 25 units in the      p.m.  2.  Regular insulin, no dose prescribed.   REVIEW OF SYSTEMS:  GENERAL: Weakness, dizziness, chills.  GI: Nausea,  nonbilious vomitus. No hematemesis. Abdominal pain, moderate. Diffuse  micturition in the entire abdomen.   LABORATORY DATA:  In the ED, CBC reveals white blood cell count of 16.8,  hemoglobin 13.3, hematocrit 39, platelet count 361,000. BMP reveals a sodium  of 124, potassium 5.9, chloride 100, BUN 39, creatinine 1.7, glucose 658.  ANC 15, ALC 1.2. ABG reveals of pH of 7.23, 22, and 9.3. ABG deficit is 16.   EKG reveals sinus tachycardia with right axis deviation. Chest x-ray report  is pending.   PHYSICAL EXAMINATION:  VITAL SIGNS: Temperature 97.9, heart rate 120,  respiratory rate 23, blood pressure 100/36, O2 saturation 98% on two liters  oxygen.  GENERAL: The patient is kind of tired, somnolent, awakened when questioned,  but unable to give useful answers, ketonic smile, not well kept.  NECK: Right neck jugular vein line. No adenopathy. Thyroid not palpable.  HEENT: Pupils reactive to light, 2 mm each. Funduscopic examination within  normal limits. Dry mucous membranes. Oropharynx within normal limits.  CVS: Tachycardic. Sinus rate. No murmurs, rubs, or gallops. CPR less than 2  seconds. Peripheral pulses palpable.  RESPIRATORY: Clear to auscultation bilaterally. No rales, rhonchi, or  wheezes.  ABDOMEN: Positive bowel sounds, significantly tender to palpation, painful  with deep palpation in  the right upper quadrant and epigastrium.  EXTREMITIES: No edema. Decreased skin turgor.  NEUROLOGIC: The patient is unable to sustain conversation. Cranial nerves  grossly normal, unable to completely explore due to the patient in sedative  state.   ASSESSMENT:  A 45 year old African American female with diabetes mellitus  type 1 on diabetic ketoacidosis with abdominal and chest pain secondary to  noncompliance, perfuse hypertension, and pancreatitis.   PLAN:  1.  DKA probably secondary to noncompliance with treatment and drug use.      1.  Dehydration. Will hydrate with bolus normal saline 500 ml times two          and continue IV fluids 250 ml per hour, normal saline per liter of          ___________.      2.  Abdominal pain. Will check liver function tests, lipase, amylase.          Will get I.D. consult.  Will consult for abdominal pain, no trigger          DKA. The patient has not been drinking for the last 2 years.  Will          order a hemoglobin A1C to check on __________ status in the last          months.  2.  Polysubstance abuse. Will check urine drug screen.  3.  DKA. Will start back on insulin ____________ CBC every two hours.  4.  Follow up with labs, CBC, urinalysis, and urine culture. _________.  5.  Hypertension. The patient is to continue medications.  6.  ___________.       IM/MEDQ  D:  05/15/2004  T:  05/15/2004  Job:  [id number]

## 2010-12-16 NOTE — Discharge Summary (Signed)
[place]. [facility]  Patient:    [name] [name], [name] [name] Visit Number: [id number] MRN: [id number]          Service Type: MED Location: [phone] Attending Physician:  Tobin Chad Dictated by:   Clydie Braun, M.D. Admit Date:  09/17/2001 Discharge Date: 09/22/2001                             Discharge Summary  CONTINUATION  HOSPITAL COURSE: #1 - She was progressed very slowly to a normal diet, but continued to have nausea and abdominal pain with meals.  Her initial differential diagnoses on admission included pancreatitis versus alcohol-induced gastritis, versus pregnancy, versus substance abuse, versus biliary disease, versus cardiac source, versus diabetic gastroparesis.  Her amylase level was only mildly elevated to 161 and her lipase level was low at 14, making pancreatitis unlikely.  Her urine pregnancy test was negative.  A cardiac source for her abdominal pain was ruled out by one panel of negative cardiac enzymes and a normal EKG.  She had a KUB done which was normal, a CT scan of the abdomen which revealed thickening of the lower most segment of the distal esophagus in a thin rim of pericholecystic fluid, but otherwise negative.  Further investigation of this pericholecystic fluid by an abdominal ultrasound did not reveal any biliary cause of this abdominal pain.  The most likely etiology of this bilateral upper quadrant pain, right greater than left, is gastritis, esophagitis due to her prior history of this.  On hospital day #3, the patient started to menstruate and felt that some of her symptoms she has been having could be due to this.  An infectious etiology of her abdominal pain was unlikely due to the drop in her white count from 17.4 to 10.4 on the day before discharge.  Her abdominal pain and nausea improved during her admission with only mild symptoms present on the day of discharge.  #2 - POLYSUBSTANCE ABUSE:  On admission, the  patient had a positive urine drug screen for cocaine and THC and her blood alcohol level was less than 5.  Prior to admission, she had an argument with her daughter which caused her to use more than her normal amount.  She is well known to the care management team at [facility] for polysubstance abuse problems in the past and has refused information or treatment for these issues.  Social work came by and spoke to her again regarding her addiction problems and the patient was willing to accept information about outpatient treatment at the [facility]  #3 - HYPOKALEMIA:  The patients admission potassium was 2.1 and received a total of 20 mEq of potassium chloride by IV.  Her potassium normalized to 4.2 on the day of discharge.  #4 - DIABETES MELLITUS, TYPE 1:  The patient has a history of multiple admissions for diabetic ketoacidosis.  Her admission glucose was 88.  She was started on a sliding scale regimen and NPH 14 units in the morning and NPH 22 units in the evening which were found from prior admissions.  When the patient became more alert, her home regimen was found to be 20 units of NPH and 10 units of Regular in the morning, and 25 units of NPH and 10 units of Regular in the evening.  She was found to have a hemoglobin A1c of 9.1, which is fairly good for such a  noncompliant patient.  She had no hypoglycemia events nor any laboratory indications of DKA during this admission.  #5 - PICC LINE:  The patient lost IV access during her hospital stay and the IV team was not able to place a peripheral line.  Due to her inability to take p.o., a PICC line was placed in her right arm without any complications.  This was removed prior to discharge.  #6 - DEPRESSION:  The patient reports years of depression, and feels that her stress of her poor relationship with her daughter may be worsening her symptoms.  She spoke to care management about this and had a very  in-depth conversation with one of the social workers.  The social worker gave her information about seeing somebody at [facility] to help her through this stressful time, the patient said she has been seen over there, but did not feel it helped in the past.  She did take the number and said that she would think about it after discharge.  DISCHARGE LABORATORY DATA:  White blood cell count 10.4, hemoglobin and hematocrit 13.9 and 41.5, platelet count 278.  Sodium 130, potassium 4.2, chloride 97, CO2 24, glucose 268, BUN 9, creatinine 0.8, calcium 8.7.  FOLLOWUP:  The patient already had a scheduled appointment with [facility] for September 24, 2001.  She was given telephone numbers to contact [facility] for her depression and for outpatient treatment of her polysubstance abuse. Dictated by:   Clydie Braun, M.D. Attending Physician:  Tobin Chad DD:  09/25/01 TD:  09/25/01 Job: [phone] [id number]

## 2010-12-16 NOTE — H&P (Signed)
NAME:  [name] [name], [name] [name]                    ACCOUNT NO.:  [id number]   MEDICAL RECORD NO.:  [id number]          PATIENT TYPE:  INP   LOCATION:  1826                         FACILITY:  [facility]   PHYSICIAN:  Madaline Savage, MD        DATE OF BIRTH:  26-Mar-1966   DATE OF ADMISSION:  07/05/2006  DATE OF DISCHARGE:                              HISTORY & PHYSICAL   PRIMARY CARE PHYSICIAN:  This patient is unassigned to [place].   The patient sees a doctor at the [facility].   CHIEF COMPLAINT:  Nausea, vomiting and abdominal pain.   HISTORY OF PRESENT ILLNESS:  [name] [name] is a 45 year old lady who has a  history of diabetes mellitus, gastroparesis, who was discharged from the  hospital 2 weeks ago, who comes in with nausea, vomiting and abdominal  pain, which has been getting worse for the last few weeks.  She was  admitted in the hospital twice in the last month with similar  complaints.  She says she was not at her baseline at the time of  discharge, and she was asked to take smaller bites of food.  She had  abdominal pain at that time and has been getting worse.  She is getting  more nauseated now, and she has been throwing up multiple times.  She  says she threw up 4 to 5 times today.  She has never seen any blood in  her vomitus.  Her abdominal pain is located in her right flank.  It does  not radiate to any other place.  She describes it as a sharp pain, and  she states the pain gets worse when she eats something.  She denies any  fever, chills, any dysuria and no breathing difficulty or chest pain at  this point of time.   PAST MEDICAL HISTORY:  1. Diabetes mellitus.  She says her diabetes was diagnosed 21 years      ago.  She is now on insulin.  2. Gastroparesis which is presumed to be secondary to her diabetes      mellitus.  She is on Reglan for it.  3. Anxiety/depression.  4. Polysubstance abuse, history of cocaine and marijuana abuse.   PAST SURGICAL HISTORY:  None.   ALLERGIES:  ALLERGIC  TO CODEINE.   CURRENT MEDICATIONS:  1. Lantus 34 units once daily.  2. Reglan 10 mg twice daily.  3. Zoloft 100 mg daily.  4. She is also on Benazepril, Vistaril and Pepcid, but she does not      know the dose of these medications.   SOCIAL HISTORY:  She states she smokes and has been smoking for a long  time.  She denies any alcohol abuse.  She last used cocaine and  marijuana 2 days ago.   FAMILY HISTORY:  Noncontributory.   REVIEW OF SYSTEMS:  GENERAL:  She denies any recent weight loss or  weight loss or weight gain, no fever or chills.  HEENT:  No headaches,  no blurred vision.  She states that her throat is dry.  CARDIOVASCULAR:  Denies chest pain but does have some epigastric pain when she vomits.  No palpitations.  RESPIRATORY:  No shortness of breath or cough.  GI:  Positive for nausea, vomiting, abdominal pain.  No diarrhea or  constipation.  EXTREMITIES:  There is no tingling or numbness of toes or  fingers.   PHYSICAL EXAMINATION:  GENERAL:  She is alert and oriented x3.  VITAL SIGNS:  Temperature is 97.5.  Her heart rate is fast at 122 per  minute. Blood pressure is 127/77.  Oxygen saturation 99% on room air.  HEENT:  Head atraumatic, normocephalic.  Pupils bilaterally reactive to  light.  Membranes are moist.  NECK:  Supple.  No JVD, no carotid bruits.  CARDIOVASCULAR:  S1, S2 heard with regular rate and rhythm.  No murmurs,  rubs or gallops.  CHEST:  Clear to auscultation.  ABDOMEN:  There is tenderness in the right flank, which is tender to  percussion.  No hepatosplenomegaly, bowel sounds heard.  EXTREMITIES:  No edema, cyanosis or clubbing.   LABORATORY:  Show a pH of 7.37.  Urine HCG is negative.  Urinalysis  shows large leukocytes, nitrites negative, but microscopy shows 21-50  WBCs.  Her BNP shows a sodium 134, potassium 4.7.  Bicarb is elevated at  29.  Creatinine is 1.4.  Her baseline creatinine on June 20, 2006  was 0.8.  Her urine drug screen was  positive for cocaine, benzos and  cannabinoid.   ASSESSMENT/PLAN:  1. Pyelonephritis.  This is a 45 year old lady with diabetes and      gastroparesis who comes in with right flank pain and positive      urinalysis.  The culture is pending.  We will admit her.  We will      start her on IV fluids and IV antibiotics.  We will obtain a renal      ultrasound to look for any abscess or any obstructive lesion.  2. Severe dehydration secondary to nausea/vomiting.  She is      tachycardic with a heart rate of 122.  Her creatinine is 1.4, and      she has contraction alkalosis with a bicarb of 29.  She is      clinically dry to me.  I will start her on IV fluids, and I will      also put her on a clear liquid diet or put her on Phenergan and      Zofran for her nausea and vomiting.  3. Acute renal insufficiency.  The creatinine is 1.4 at this point of      time, which is [place] from her baseline value of 0.8.  Will get a      urine sodium to confirm the diagnosis, will continue her on IV      fluids.  4. Diabetic gastroparesis.  Will continue her on Reglan.  5. Diabetes mellitus.  Will continue her on the dose of Lantus she is      on at home.  Will put her on a sliding scale and watch her blood      sugars.  6. Hypotension.  She does not remember the dose of the benazepril she      takes at home.  Right now, the blood pressure is stable.  Will      restart Benazepril, once she brings the medications from home.  7. Will put her on DVT prophylaxis.      Madaline Savage, MD  Electronically Signed  PKN/MEDQ  D:  07/05/2006  T:  07/05/2006  Job:  [id number]

## 2010-12-16 NOTE — Discharge Summary (Signed)
[place]. [facility]  Patient:    [name] [name] Visit Number: [id number] MRN: [id number]          Service Type: MED Location: [phone] Attending Physician:  Tobin Chad Dictated by:   Bradly Bienenstock, M.D. Admit Date:  09/17/2001 Discharge Date: 09/22/2001                             Discharge Summary  DISCHARGE DIAGNOSES:  1. Abdominal pain, secondary to gastritis/esophagitis.  2. Hypertension.  3. Type 1 diabetes mellitus.  4. Polysubstance abuse.  5. Gastritis.  6. Esophagitis,  7. Gastroesophageal reflux disease.  8. Diabetic gastroparesis.  9. Hyperlipidemia. 10. History of diabetic ketoacidosis. 11. History of peptic ulcer disease. 12. History of pancreatitis. 13. History of recurrent urinary tract infections. 14. History of cerebrovascular accident in 2000 with return of function. 15. Noncompliance.  DISCHARGE MEDICATIONS: 1. Protonix 40 mg one tablet p.o. q.d. 2. Enalapril 10 mg one tablet p.o. q.d. 3. NPH Insulin 20 units subcutaneously q.a.m. and 25 units subcutaneously    q.p.m. 4. Regular Insulin 10 units subcutaneously q.a.m. and 10 units    subcutaneously q.p.m. 5. Aspirin 81 mg p.o. q.d.  PROCEDURES: 1. Placement of PICC line. 2. CT scan of the abdomen and pelvis revealed circumferential wall    thickening of the lower most segment of the esophagus consistent    with esophagitis or reflux but underlying distal esophageal mass    can not be excluded by CT.  A thin rim of pericholecystic fluid is    identified in the abdomen and may be indicative of underlying    cholecystitis.  Stable multiple regions of cortical scarring were    seen involving the left kidney.  Delayed in imaging shows symmetric    excretion of contrast bilaterally by the kidneys without hydronephrosis.    A moderate amount of free fluid in the pelvis was noted and a stable    enlarged uterus demonstrating multiple fibroids was also noted. 3. Ultrasound  of the abdomen, impression normal. 4. Chest x-ray:  Impression normal. 5. Abdominal film:  Impression normal.  HISTORY OF PRESENT ILLNESS:  [name] [name] is a 45 year old African-American female with multiple admissions for diabetic ketoacidosis secondary to noncompliance, polysubstance abuse, and pancreatitis/gastritis.  She presented to the emergency room on September 17, 2001 complaining of abdominal pain and protracted emesis since 11 a.m. of that day.  She localized her pain to the left upper quadrant and right upper and lower quadrants.  The patient reported that these symptoms began after an intense argument with her daughter after which she went on a cocaine and marijuana binge.  She was found by a female friend that morning vomiting and unable to keep p.o. down and was brought to the emergency room.  On exam, she was found to be hypersomnolent secondary to two consecutive doses of Phenergan and possible substances she ingested prior to arriving to the emergency room.  LABORATORY DATA:  On admission white count was 17.4, H&H 14.3 and 41.5, platelets 331,000; differential was 94% segs, 5% lymphocytes, 1% monocytes. CMP:  Her sodium was 139, potassium 2.1, chloride 97, bicarbonate 29, and BUN 11, creatinine 0.8, glucose 88, calcium 9.9, total protein 8.5, alkaline phosphatase 88, albumin 3.7, AST 30, ALT 14, bilirubin 1.3.  Her urinalysis had a specific gravity of greater than 1.040; she had ketones greater than 80, protein greater than 300, small leukocyte esterase, 0  to 2 white blood cells, 3 to 6 red blood cells, a few bacteria and many epithelial cells.  Her lipase was 14.  Her amylase was 161.  Her urine drug screen was negative for TCAs but positive for marijuana and positive for cocaine.  Her blood alcohol level was less than 5.  Her urine pregnancy test was negative.  She had one panel of negative cardiac enzymes.  Her hemoglobin A1C was 9.1.  Lipid panel: Her cholesterol was 225,  triglycerides 58, HDL 76, VLDL 12, and LDL 137.  An ABG showed mild metabolic alkalosis with pH 7.468, pCO2 38.7 and bicarbonate 28.3.  HOSPITAL COURSE:  1.  ABDOMINAL PAIN SECONDARY TO ESOPHAGITIS/GASTRITIS:  On admission the patient had intractable nausea, vomiting, and epigastric/right lower quadrant pain whenever she attempted to take food, but was able to tolerate liquids. After admission day one, the vomiting resolved but the abdominal pain and nausea continued causing her to have a decreased p.o. intake.  Protonix was started secondary to history of gastritis and esophagitis and current emesis. She was progressed very slowly to a normal diet but continued to have nausea and abdominal pain with meals. Dictated by:   Bradly Bienenstock, M.D. Attending Physician:  Tobin Chad DD:  09/22/01 TD:  09/23/01 Job: [id number]

## 2010-12-16 NOTE — Discharge Summary (Signed)
NAME:  [name] [name], [name] [name]                                ACCOUNT NO.:  [id number]   MEDICAL RECORD NO.:  [id number]                   PATIENT TYPE:  NP   LOCATION:  2901                                 FACILITY:  [facility]   PHYSICIAN:  No First Name Goward                DATE OF BIRTH:  October 13, 1965   DATE OF ADMISSION:  01/01/2002  DATE OF DISCHARGE:  01/04/2002                                 DISCHARGE SUMMARY   DISCHARGE DIAGNOSES:  1. Diabetic ketoacidosis.  2. Hypertension.  3. Gastroesophageal reflux disease.  4. Esophagitis and gastritis.  5. Dehydration.  6. Polysubstance abuse.  7. Leukocytosis.  8. Vaginitis and history of Trichomonas and bacterial vaginosis.  9. Menstrual cramps.   DISCHARGE MEDICATIONS:  1. Humulin N 25 units in the morning, 25 units in the evening.  2. Protonix 40 mg q.d.  3. Flagyl tapering dose on treatment as pertinent.   CONSULTANTS:  None.   PROCEDURES PERFORMED:  Insertion of peripherally inserted central catheter  line performed on January 01, 2002 under fluoroscopic guidance.   BRIEF HISTORY:  The patient is a 45 year old African American who has a  history of diabetes mellitus, type 1, and recurrent DKAs, as well as  polysubstance abuse, hypertension and gastritis, who presented with a  history of nausea, vomiting, abdominal pain and diarrhea for about eight  days with subjective fever.  Since then, the patient was unable to keep  anything down.  She denied alcohol intake but confirmed taking cocaine three  days before admission, also having vaginal discharge the day before  admission with some discharge at nighttime.  The patient has been on home  insulin and she currently is taking, although she apparently did not take it  at that point on that day.   PHYSICAL EXAMINATION:  On her exam then, she was found to have a temperature  of 98, blood pressure 122/70, pulse of 131, respirations of 28.  Her O2  saturation was 96% on room air.  She was  generally drowsy but arousable and  cognizant.  HEENT demonstrates small pupils but EOMI, with brown mucous  membranes.  Neck was supple.  No JVD.  No lymphadenopathy.  Chest was clear  to auscultation and symmetrical with good respirations.  She was tachycardic  with a regular rhythm.  Abdomen was scaphoid with mild diffuse tenderness  but no masses or bruits with positive bowel sounds.  EXTREMITIES:  She has  no edema, cyanosis or clubbing and she was able to move all extremities.  Neuro exam was nonfocal.  Her GU and pelvic exam shows normal genitalia with  thick, cheesy discharge.   LABORATORY AND ACCESSORY CLINICAL DATA:  Her initial labs showed a white  count of 10.2, hemoglobin 13.1, platelets 288,000 with an MCV of 88.5;  sodium 134, potassium 4.2, chloride 98, CO2 of 18, BUN 21, creatinine 1.3  and glucose 658.  Alkaline phosphatase was 103, AST 27, ALT 14, total  protein 7.7, albumin 3.8, total bilirubin 0.9.  Amylase was 89, lipase 20.  Blood acetone was positive but small.  Lactic acid was 1.7.  UA showed  specific gravity 1.034, pH 5.5, glucosuria of more than 1000, ketones more  than 80, nitrite negative, leukocyte esterase negative.  Pelvic exam was  positive for Trichomonas and positive for clue cells but no yeast.  Urine  microscopy showed wbc's 0-2 with rare bacteria.   EKG shows sinus tachycardia with a rate of 123 but no significant changes,  Q's.   Her alcohol level was less than 5.   HOSPITAL COURSE:  1. DIABETIC KETOACIDOSIS:  Based on the patient's presentation with Digeronimo     blood glucose, acidosis as well as positive for ketones, diagnosis of     diabetic ketoacidosis was made.  The patient was admitted into stepdown     and was given insulin drip at the rate of 1 u/kg initially, was given     lots of IV fluids as well as potassium replacement until her gap closed.     The patient responded very well to initial treatment and by the next     morning, her gap  was closed, however, the patient consistently continued     to have nausea and vomiting as well as abdominal pain and cramps.  She     was counseled once more on the need for her to continue taking her     insulin as prescribed.  At the time of discharge, she was discharged on     insulin dose while her CBGs are in the range of 70 to 110.   1. DEHYDRATION:  This was thought to be secondary to DKA above and the     patient was given lots of fluids and hydrated with close to 7 L of fluid     in the first 24 hours alone.   1. VAGINITIS:  The patient was positive for vaginitis with history of     Trichomonas and possible BV.  She was treated with metronidazole and she     was given a couple of medications, metronidazole pills, to give to her     partner at home.  She was to follow up  later at [facility] for further     evaluation.   1. LEUKOCYTOSIS:  This was thought to be probably secondary to the patient's     stressful state of being in DKA as well as her Trichomonas infection.     The patient was afebrile _________ ,   1. HYPERTENSION:  The patient has a history of hypertension, however, her     blood pressure throughout this hospitalization has remained stable.  She     was not started on any antihypertensive medication.   1. GASTROESOPHAGEAL REFLUX DISEASE:  The patient has a history of GERD with     a history of esophagitis and gastritis secondary to alcohol abuse and she     kept  having nausea and vomiting during this hospitalization.  As such,     she was placed on Protonix throughout and she was discharged on Protonix.   1. MENSTRUAL CRAMPS:  The patient had her menses while in the hospital as     she was having severe abdominal pain which she attributed to menstrual     cramps that occur monthly.  We gave the patient a few  doses of NSAIDs,     specifically ibuprofen, while in the hospital.  Her cramps seemed to    improve before discharge, so she was not discharged on any  further     medication.   DISCHARGE LABORATORY DATA:  On the day of discharge, the patient's labs were  as follows:  Sodium 137, potassium 3.1, chloride 103, CO2 27 and glucose is  124, BUN 15, creatinine 1.1 and calcium 9.5; also had a WBC of 8.5,  hemoglobin 13.5 and platelets 267,000.    DISPOSITION AND FOLLOWUP:  The patient is to follow up at [facility].  At  that followup, the patient's CBG will be checked and further adjustment of  her insulin dose will be made if needed.                                                No First Name Goward    NG/MEDQ  D:  02/28/2002  T:  03/06/2002  Job:  [id number]   cc:   [facility]  [facility]

## 2010-12-16 NOTE — Discharge Summary (Signed)
NAME:  [name] [name]                    ACCOUNT NO.:  [id number]   MEDICAL RECORD NO.:  [id number]          PATIENT TYPE:  INP   LOCATION:  5532                         FACILITY:  [facility]   PHYSICIAN:  Lonia Blood, M.D.       DATE OF BIRTH:  1966-05-07   DATE OF ADMISSION:  07/05/2006  DATE OF DISCHARGE:                               DISCHARGE SUMMARY   INTERIM DISCHARGE SUMMARY:   PRIMARY CARE PHYSICIAN:  [facility].   DISCHARGE DIAGNOSES:  1. 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  2. Gastroesophageal reflux disease.  3. Diabetic gastroparesis with recurrent nausea and vomiting.  4. Diabetes mellitus, type 1, with episodes of severe hypoglycemia.  5. Cocaine and marijuana abuse.  6. Anxiety and depression.  7. Hypertension.  8. Dehydration, resolved.  9. Acute renal insufficiency, resolved.   DISCHARGE MEDICATIONS:  Will be dictated at the time of actual discharge  of this patient.   DISPOSITION:  Will be determined at the time of actual discharge of this  patient.   PROCEDURES DURING THIS ADMISSION:  1. July 06, 2006, the patient underwent a renal ultrasound with      findings negative for renal abscess or findings to strongly suggest      acute pyelonephritis, possible nonspecific renal medical disease.  2. July 06, 2006, barium swallow with findings of a marked distal      esophageal stricture and extensive irregularity of the mucosa,      probable ulceration.  3. July 07, 2006, abdominal ultrasound with findings of no      gallstones.  4. July 06, 2006, endoscopy by Dr. Carman Ching with findings of      severe esophagitis with stricture and obstruction, possibly due to      infection versus ulcer versus malignancy.   CONSULTATIONS DURING THIS ADMISSION:  The patient was seen in  consultation by Dr. Randa Evens from [facility].   HISTORY OF PRESENT ILLNESS:  For admission History and Physical, please  refer to dictated H&P done  by Dr. Darene Lamer on July 05, 2006.   HOSPITAL COURSE:  #1.  DYSPHAGIA:  [name] [name] came in with complaints of food lodging in  her chest. 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  For this reason, the  patient was seen in consultation by Dr. Randa Evens, gastroenterology, and  she underwent upper endoscopy.  During the upper endoscopy, a Oboyle-grade  esophageal stricture was found, and samples were sent for fungal  cultures, viral cultures, and a biopsy was done.  The final results of  the fungal cultures and viral cultures are negative, and the results of  the pathology are confirming of benign stricture with an esophageal  ulceration.  There were no malignant cells identified.  The patient also  had an HIV antibody test which was negative.  [name] [name] received  treatment with intravenous Protonix as well as a clear liquid diet  during this period of hospitalization.  The current plan is to continue  a liquid diet and Padget-dose proton  pump inhibitors and to repeat an  upper endoscopy and do a possible dilatation in the next 3-4 days.   #2.  RECURRENT NAUSEA AND VOMITING:  This is most likely related to  diabetic gastroparesis.  [name] [name] was titrated on Dubach doses of Reglan,  and she has also received counseling about small, frequent meals and  sitting upright after each meal.   #3.  PROBABLE URINARY TRACT INFECTION ON ADMISSION:  [name] [name] was  treated with 3 days of Cipro, and she currently is asymptomatic.   #4.  DEHYDRATION AND ACUTE RENAL INSUFFICIENCY:  This was secondary to  the persistent nausea and vomiting.  By the time of my dictation, the  patient's dehydration and renal insufficiency have resolved with  generous intravenous fluids.   #5.  ANEMIA:  This has been evaluated with anemic panel, and it appears  to be secondary to chronic disease.   #6.  DIABETES MELLITUS, TYPE 1:  [name] [name] was kept on Lantus and   sliding scale insulin during this period of time, and her doses were  adjusted according to the CBG findings.      Lonia Blood, M.D.  Electronically Signed     SL/MEDQ  D:  07/10/2006  T:  07/10/2006  Job:  [id number]   cc:   Daine Gip L. Malon Kindle., M.D.

## 2010-12-16 NOTE — Consult Note (Signed)
   NAME:  [name] [name], [name] [name]                                ACCOUNT NO.:  [id number]   MEDICAL RECORD NO.:  [id number]                   PATIENT TYPE:  INP   LOCATION:  2019                                 FACILITY:  [facility]   PHYSICIAN:  Aquilla Hacker, M.D.                    DATE OF BIRTH:  1966/03/16   DATE OF CONSULTATION:  01/30/2003  DATE OF DISCHARGE:  01/31/2003                                   CONSULTATION   PLASTIC SURGERY CONSULTATION:   REASON FOR CONSULTATION:  This 45 year old black female is seen for  evaluation of the left arm post extravasation of IV contrast solution today.  She was admitted for evaluation of abdominal pain and nausea and vomiting.  She has a history of alcohol, tobacco, and cocaine abuse as well as insulin-  dependent diabetes.   PHYSICAL EXAMINATION:  On exam she still has swelling of the left forearm  with some mild pain in the area.  There is good range of motion of the  fingers and slight restriction of wrist range of motion.  There are no  sensory changes in the hand or arm.  There is good perfusion of the fingers  and arm.   RECOMMENDATIONS:  Warm compresses to the forearm until the swelling  decreases.  Circulation checks will be done for the hand over the next 12  hours.                                               Aquilla Hacker, M.D.    DB/MEDQ  D:  01/30/2003  T:  01/31/2003  Job:  [id number]

## 2010-12-16 NOTE — H&P (Signed)
NAME:  [name] [name], [name] [name]                    ACCOUNT NO.:  [id number]   MEDICAL RECORD NO.:  [id number]          PATIENT TYPE:  INP   LOCATION:  1824                         FACILITY:  [facility]   PHYSICIAN:  Kela Millin, M.D.DATE OF BIRTH:  Aug 01, 1965   DATE OF ADMISSION:  06/14/2006  DATE OF DISCHARGE:                              HISTORY & PHYSICAL   CHIEF COMPLAINT:  Persistent nausea and vomiting.   HISTORY OF PRESENT ILLNESS:  The patient is a 45 year old black female  with past medical history significant for diabetes mellitus and renal  insufficiency who was recently discharged from the hospital following  treatment for probable gastroenteritis (nausea, vomiting, and diarrhea)  and presents with above complaints.  She reports that she began having  nausea and vomiting and was seen at [facility] ER on the day prior to  the admission and discharged home at that time but states that her  nausea and vomiting has persisted, and so she came back to the [facility] ER.  She denies diarrhea.  It was noted that patient was given Dilaudid in  the ER, although she does not report any pain at this time.  No reports  of hematemesis, hematochezia, melena, and no dysuria, questionable  subjective fevers.   The patient was seen in the ER, and a CT scan of the abdomen was done  with no acute findings, normal appendix. She had a urine drug screen  done which was positive for cocaine.  Her chemistries showed a blood  glucose of 434 with a CO2 of 18, creatinine 1.9, BUN 30.  She is  admitted to the [facility] service for further management of a  DKA.   PAST MEDICAL HISTORY:  1. As stated above.  2. History of anxiety and depression.   MEDICATIONS:  1. Lantus 30 units nightly.  2. Zoloft.  3. Phenergan.  4. Benazepril.  5. Pepcid.  6. Oxycodone.  7. Acetaminophen.  8. Vistaril.   The patient does not know the doses of the rest of her medications.   ALLERGIES:  CODEINE.   SOCIAL  HISTORY:  Positive for tobacco.  Denies alcohol, states that her  last cocaine use was about 2 months ago (noted her urine drug screen  today is positive for cocaine).   FAMILY HISTORY:  Noncontributory.   REVIEW OF SYSTEMS:  As per HPI.  Other Review of Systems negative.   PHYSICAL EXAMINATION:  GENERAL: The patient is a middle-aged black  female.  She looks older than her stated age, in no respiratory  distress.  She is drowsy (status post Dilaudid in the ER).  VITAL SIGNS: Temperature 98.8, blood pressure 113/64, pulse 132,  respiratory rate 20, O2 saturation 100%.  HEENT:  Dry mucous membranes.  PERRLA.  EOMI.  No oral exudate.  NECK: Supple.  No adenopathy and no thyromegaly, no JVD.  LUNGS: Clear to auscultation bilaterally.  No crackles or wheezes.  CARDIOVASCULAR: Tachycardic, normal S1 and S2, regular rhythm.  ABDOMEN: Soft.  Bowel sounds present.  Nontender, nondistended.  No  organomegaly and no masses palpable.  EXTREMITIES: No cyanosis, clubbing, or edema.  NEUROLOGIC: She is drowsy, follows simple commands, moving all  extremities, grossly nonfocal.   LABORATORY DATA:  CT scan of abdomen and pelvis: No acute findings,  normal appendix, fibroid uterus.   Chest x-ray on November 12: No acute findings.   Sodium 133, potassium 141, chloride 98, BUN 31, glucose 434.  The pH is  7.34, pCO2 40, bicarb 21.7.  White cell count 10.3 with hemoglobin of  13.2, hematocrit 39.5, platelet count 562, neutrophil count 87%.  Urinalysis shows specific gravity of 1.026.  Urine glucose is greater  than 1000, urine ketones greater than 80, and urine nitrite negative,  leukocyte esterase negative.  Her urine drug screen is positive for  cocaine and benzodiazepines, also positive for opiates.  CO2 is 18, and  creatinine is 1.9.  Alcohol level is less than 5.   ASSESSMENT AND PLAN:  1. Diabetic ketoacidosis: Will aggressively hydrate with IV fluids, IV      insulin by Glucomander.  Her  chest x-ray is negative.  CT scan of      the abdomen is negative as well.  Likely precipitating factor is      volume depletion/dehydration.  Will obtain a set of cardiac enzymes      to further evaluate.  Monitor BMET q. 4 h.  2. Volume depletion: As above, hydrate.  3. Renal insufficiency, likely secondary to above: Hydrate and follow.  4. Persistent nausea and vomiting:  Will obtain serum amylase and      lipase, urinalysis negative as above. Hydrate, supportive care.      Place patient on Reglan and follow.  5. Cocaine abuse: Patient counseled to quit.      Kela Millin, M.D.  Electronically Signed     ACV/MEDQ  D:  06/14/2006  T:  06/14/2006  Job:  [id number]

## 2010-12-16 NOTE — Discharge Summary (Signed)
NAME:  [name] [name], [name] [name]                    ACCOUNT NO.:  [id number]   MEDICAL RECORD NO.:  [id number]          PATIENT TYPE:  INP   LOCATION:  5013                         FACILITY:  [facility]   PHYSICIAN:  Kela Millin, M.D.DATE OF BIRTH:  1966/04/15   DATE OF ADMISSION:  06/14/2006  DATE OF DISCHARGE:  06/21/2006                               DISCHARGE SUMMARY   DISCHARGE DIAGNOSES:  1. Diabetic ketoacidosis/uncontrolled diabetes mellitus.  2. Presumed gastroparesis.  3. Acute renal insufficiency - resolved.  4. History of anxiety and depression.  5. Cocaine abuse - the patient counseled to quit.   PROCEDURES AND STUDIES:  1. A CT scan of the abdomen and pelvis done June 12, 2006 -      unremarkable CT appearance of the abdomen, persistent distal      esophageal wall thickening which may represent esophagitis.  CT of      the pelvis which shows an enlarged fibroid uterus.  Normal      appearance of ovaries, normal appendix.  2. HIDA scan - ejection fraction after 60 minutes 24%, but noted that      the study is not valid per radiology since the patient was on      morphine which reduces the ejection fraction of the gallbladder.   HISTORY:  The patient is a 45 year old black female with a past medical  history significant for diabetes mellitus, renal insufficiency who  presented with a persistent nausea and vomiting.  It was noted that she  had just been recently discharged from the hospital with a diagnosis of  probable gastroenteritis.  She reported that she began having nausea,  vomiting, was seen at [facility] on the day prior to this  admission and was discharged home at the time but her nausea, vomiting  persisted, and so she came back to the ER.  She denied diarrhea.  The  patient denied hematemesis, hematochezia, melena, also denied dysuria,  questionable subjective fevers.  In the ER, it was noted that she had  had a CT during her previous ER visit and it showed  no acute findings as  above.  Urine drug screen was positive for cocaine and her chemistries  showed a blood glucose of 434 with a CO2 of 18, creatinine of 1.9, BUN  of 30.  She was admitted to the [facility] for further  management of DKA.   PHYSICAL EXAMINATION:  On admission, revealed a temperature of 98.8,  blood pressure 113/64, pulse of 132 with a respiratory rate of 20.  O2  sat of 100%.  GENERAL:  She looked older than her stated age in no respiratory  distress.  The other pertinent findings on exam, dry mucous membranes and the rest  of the physical exam noted to be within normal limits.   LABORATORY DATA:  The imaging studies are as stated above and has sodium  133, potassium 4.1, and chloride 98, BUN 31, glucose 434, CO2 of 18, pH  7.34, white cell count 10.3, hemoglobin 13.2, hematocrit 39.5, platelets  count of 562.  Urinalysis negative for infection.  Urine drug screen  positive for cocaine, benzodiazepines, and opiates.  Creatinine 1.9.  Alcohol level less than 5.   HOSPITAL COURSE:  Diabetic ketoacidosis/uncontrolled diabetes mellitus -  upon admission, the patient was aggressively hydrated with IV fluids and  placed on IV insulin per Glucometer.   IMPRESSION:  1. The precipitating factor for her diabetic ketoacidosis was volume      depletion/dehydration secondary to her persistent nausea and      vomiting.  A CT scan of the abdomen was done and was negative and      her cardiac enzymes were negative for myocardial infarction.  Once      her acidosis resolved, the intravenous insulin per Glucometer was      discontinued as per protocol and the patient was restarted on her      Lantus and the dose adjusted as appropriate for better blood      glucose control.  She has been discharged on Lantus 34 units, which      is an increase from the 30 units that she was on previously.  Her      blood sugars in the past 48 hours per BMET have ranged from 166 to       123.  2. Presumed gastroparesis - as discussed above, the patient presented      with persistent nausea, vomiting, and it was noted that her sugars      have been uncontrolled, as she has been a diabetic for a long time      as well.  She was placed on Reglan upon admission and her symptoms      improved although she intermittently while in the hospital      continued to have some nausea and vomiting.  She had a HIDA scan      and it showed an ejection fraction of 24% as above but, per      radiology, since the patient was on morphine, this is not a valid      study.  The patient had had workup including abdominal ultrasound      in the previous recent hospital stay which showed no gallstones or      evidence of acute cholecystitis.  After her Reglan was adjusted to      be given before meals and also her diet changed to 4-6 small meals      a day, she has not had any further nausea and vomiting.  A gastric      emptying study was ordered but, due to the holidays, it would not      be done and the patient is anxious to go home and is clinically      much improved.  So, she will continue the Reglan upon discharge and      the 4-6 small meals daily.  She is to followup with Dr. Beverley Fiedler and the gastric emptying study to be done as an outpatient.      Again, the patient is tolerating her diet well with no nausea,      vomiting at this time and she remains hemodynamically stable with      good blood glucose control.  3. Cocaine abuse - the patient was consulted within the hospital.      Social Services was consulted to see her as well and, during the      discussion, she indicated that she had taken  cocaine 2-3 weeks      prior to this admission but also indicated that she did not feel      substance use is a problem for her and so she declined information      on treatment or community resources or quitting. 4. Anxiety/depression - the patient was placed back on her Zoloft       during her hospital stay and she is to continue this upon      discharge.   DISCHARGE MEDICATIONS:  1. Reglan 10 mg p.o. q.a.c. and nightly.  2. Lantus change to 34 units daily.  3. The patient to continue her other preadmission medications.   FOLLOWUP CARE:  Dr. Barbaraann Barthel in 3-5 days.   DISCHARGE DIET:  Four to 6 small meals daily.   DISCHARGE CONDITION:  Improved/stable.      Kela Millin, M.D.  Electronically Signed     ACV/MEDQ  D:  06/21/2006  T:  06/21/2006  Job:  [id number]   cc:   Fanny Dance. Rankins, M.D.  [facility]

## 2010-12-16 NOTE — Discharge Summary (Signed)
[place]. [facility]  Patient:    [name], [name] Visit Number: [id number] MRN: [id number]          Service Type: MED Location: 5500 [id number] [id number] Attending Physician:  [name], [name] Post Dictated by:   [name] [name], M.D. Admit Date:  03/29/2001 Discharge Date: 04/02/2001                             Discharge Summary  ADMISSION DIAGNOSES: 1. Diabetic ketoacidosis. 2. Acute pancreatitis.  DISCHARGE DIAGNOSES: 1. Diabetic ketoacidosis. 2. Acute pancreatitis.  DISCHARGE MEDICATIONS: 1. Insulin 70/30 15 units q.h.s. 2. Protonix 40 mg p.o. q.d. 3. Phenergan 15 mg one tablet p.o. q.6h. p.r.n. nausea. 4. Darvocet-N 100 one tablet q.6h. p.o. p.r.n. abdominal pain.  PROBLEM LIST: 1. Diabetic ketoacidosis. 2. Polysubstance abuse. 3. Anemia, chronic, normocytic. 4. Reflux disease.  HISTORY OF PRESENT ILLNESS:  A 45 year old African-American female with a history of type 1 diabetes and a history of DKA, last seen at [facility]. [facility] in April of 2002.  She also has a history of alcoholic gastritis and polysubstance abuse.  She presented to the emergency department via EMS, accompanied by her daughter from whom we obtained information for the current H&P.  The daughter reports that the patient has had nausea and vomiting throughout the day since 10 a.m. this morning, however, was fine when the daughter left the mother in the early morning a 8 a.m. to head out for school.  The patient denies that the patient had complaints of chest pain/abdominal pain/constipation/diarrhea/fever/chills/dysuria.  Also denies hematemesis/hematochezia/melena.  Her primary M.D. is at [facility].  ALLERGIES:  CODEINE.  PAST MEDICAL HISTORY:  Per HPI.  Also, the daughter states that the patient may have had a stroke back in 2000 or 2001.  She is not sure about the year. It was left-sided with total return of function.  She also has a history of recurrent  pneumonias.  FAMILY HISTORY:  The patients mother died of lung cancer at unknown age.  She also had diabetes.  Her dad died of a stroke also at unknown age.  A sister with breast cancer.  A second sister died from a stomach cancer at the age of 23.  SOCIAL HISTORY:  Positive tobacco use x 10-12 years, roughly half of a pack a day for that entire duration.  Positive alcohol use.  Last drink in April of 2002.  Also positive cocaine, last use unknown.  [place] two sisters and [place]  REVIEW OF SYSTEMS:  Heat intolerance.  Weekly headaches, however, controlled with over-the-counter medicines, such as Tylenol, Motrin, and ibuprofen. Subjective weight loss.  Denies hoarseness, dysphagia, and odynophagia per HPI.  PHYSICAL EXAMINATION:  VITAL SIGNS:  Temperature 97.3 degrees, blood pressure 147/73, heart rate 141, respiratory rate 45, saturating 100% on room air.  GENERAL APPEARANCE:  She is a thin black female laying in bed in severe distress with Kussmaul breathing.  Afebrile.  Disoriented, but able to communicate to simple yes and no questions.  HEENT:  NCAT.  PERRL.  EOMI.  Muddy sclerae.  No LAD.  NECK:  No JVD.  The neck was supple.  No masses.  No bruits.  CARDIOVASCULAR:  Regular rhythm, but tachycardic.  Difficult to auscultate, however, did not hear any murmurs, rubs, or gallops.  LUNGS:  Tachypneic.  No crackles or wheezes.  Prominent use of accessory muscles.  ABDOMEN:  Benign.  Positive  bowel sounds.  No masses.  EXTREMITIES:  No edema.  Pulses 2+ in dorsalis pedis bilaterally.  NEUROLOGIC:  Alert, however, was confused.  No tongue deviation.  Deferred the rest of the neurologic exam until the next morning when she was more aware and alert.  SKIN:  A 1-2 cm bruise over her right cheek.  No warmth and no tenderness, however, there is erythema in the area apparent.  The patient stated that the lesion appeared as a pimple-like  lesion and then became more bullous in nature.  LABORATORY DATA:  Potassium 5.8, sodium 134, chloride 93, bicarbonate 2, BUN 33, creatinine 2.4, glucose 954, and anion gap 39.  Her initial ABG was pH 7.1, pCO2 7.4, and pO2 140.  UDS was negative.  She had a white count of 16.6, hemoglobin 12.5, and platelets 356 with an MCV of 96.  The UA was positive for glucose and a few bacteria, however, a specific gravity of 1.028 and pH 5.0.  The liver panel showed total protein 7.4, albumin 3.4, AST 14, ALT 16, alkaline phosphatase elevated at 121, and total bilirubin 3.4.  The chest x-ray showed no acute disease.  EKGs were sinus tachycardia with right atrial enlargement, otherwise no changes from previous EKGs.  HOSPITAL COURSE: #1 - DIABETIC KETOACIDOSIS:  Admitted to the ICU and placed on telemetry. Repeated EKG in the morning.  Gave her an IV fluid bolus of normal saline and also an insulin bolus initially in the ER and followed with aggressive hydration.  We had to place a central line since she has had bad peripherals and the IV team was unable to get a good stick.  Continued to hydrate until the anion gap closed, at which point we switched her over to D5 1/2 normal saline.  Started BMET checks to follow her potassium and her metabolic acidosis q.2h. and also were obtaining fingerstick blood glucoses q.1h.  When her bicarbonate reached approximately 20, we then began monitoring her potassium and repleted as necessary.  Within the overnight period, she had corrected her acidosis and her potassium was stable.  #2 - ANEMIA, NORMOCYTIC:  Previously worked during an April of 2002 admission. Consistent with iron deficiency and we supplemented with ferrous sulfate throughout her hospital course.  #3 - REFLUX:  Maintained her on Protonix 40 mg IV q.d.  We changed her to p.o. when she was tolerating and discharged her with p.o. medications  #4 - ELEVATED ALKALINE PHOSPHATASE AND TOTAL  BILIRUBIN:  We got a repeat in the morning.  Continued elevation.  Obtain an ultrasound which showed no  gallbladder pathology or signs of acute cholecystitis.  The patient never complained of right upper quadrant tenderness and her enzymes were normal, so we will just follow on an outpatient basis with repeat CMP to check alkaline phosphatase and total bilirubin.  She was also given IV pain medications for her abdominal pain most likely due to acute pancreatitis on top of chronic pancreatitis.  She was tolerating liquids the following morning and we advanced her diet to solids later that afternoon and she had no episodes of nausea or vomiting.  DISPOSITION:  She was discharged in stable condition.  FOLLOW-UP:  A follow-up appointment was made with [facility] to see her primary care physician there.  DISCHARGE INSTRUCTIONS:  Instructed to maintain a diabetic diet, monitor blood glucoses, and to return to the emergency room for any other similar episodes. Dictated by:   [name] [name], M.D. Attending Physician:  [name], [name] Post DD:  04/04/01 TD:  04/04/01 Job: [id number]

## 2010-12-16 NOTE — Discharge Summary (Signed)
NAME:  [name] [name], [name] [name]                    ACCOUNT NO.:  [id number]   MEDICAL RECORD NO.:  [id number]          PATIENT TYPE:  INP   LOCATION:  5532                         FACILITY:  [facility]   PHYSICIAN:  Hettie Holstein, D.O.    DATE OF BIRTH:  January 19, 1966   DATE OF ADMISSION:  07/05/2006  DATE OF DISCHARGE:                               DISCHARGE SUMMARY   ADDENDUM:  [name] is a 45 year old female who suffers from dysphagia,  felt to be related to esophageal stricture related to ulcers.  For full  details, please refer to the discharge summary as dictated by Dr. Lavera Guise;  however, briefly, she has continued to be troubled by this stricture  with poor dietary tolerance and sensation of food not moving down her  esophagus.  The subsequent hospital course has been that of attempting  to manage her morning nausea and vomiting, as well as erratic glycemic  control due to the dynamic nature of her oral intake.  In any event, it  is suspected that she does suffer from underlying gastroparesis that is  further exacerbated by poor glycemic control.  We have been working with  the diabetes Financial planner and following recommendations.  At this  time, we are up-titrating her Lantus to 25 units subcutaneous q.h.s.  She is currently NPO in preparation for her upper endoscopic evaluation.  I would recommend continuing moderate sliding scale with h.s. coverage.  If she continues to have labile control including multiple hypoglycemic  events, it may be reasonable to split her Lantus into b.i.d. dosing, and  instructions to hold her morning dose if she exhibits nausea, vomiting  and poor oral intact, though at present we are going to continue with  single daily dosing of Lantus at 25 units subcutaneous q.h.s. and  continue working with q.a.c., h.s. and meal coverage for now, and  attempt to achieve better glycemic control, and, as a result, achieve  some improvement in reference to her gastroparesis.   She  is being scheduled for EGD by gastroenterology today.  It was  initially anticipated that she would undergo this in the outpatient  setting, but due to her labile glycemic control, in addition to nausea  and vomiting and continued symptoms, it was elected to follow her as an  inpatient.      Hettie Holstein, D.O.  Electronically Signed     ESS/MEDQ  D:  07/17/2006  T:  07/17/2006  Job:  [id number]   cc:   Fayrene Fearing L. Malon Kindle., M.D.  [facility]

## 2010-12-16 NOTE — Op Note (Signed)
NAME:  [name] [name], [name] [name]                    ACCOUNT NO.:  [id number]   MEDICAL RECORD NO.:  [id number]          PATIENT TYPE:  INP   LOCATION:  5532                         FACILITY:  [facility]   PHYSICIAN:  John C. Madilyn Fireman, M.D.    DATE OF BIRTH:  1966-07-26   DATE OF PROCEDURE:  07/18/2006  DATE OF DISCHARGE:                               OPERATIVE REPORT   PROCEDURE:  Esophagogastroduodenoscopy with esophageal dilatation.   ENDOSCOPIST:  Everardo All. Madilyn Fireman, M.D.   INDICATIONS FOR PROCEDURE:  Known very tight, inflamed esophageal  stricture with dysphagia.   PROCEDURE:  The patient was placed in the left lateral decubitus  position and placed on the pulse monitor with continuous low-flow oxygen  delivered by nasal cannula.  She was sedated with 75 mcg of IV fentanyl  and 5 mg of IV Versed.  Olympus video endoscope was advanced under  direct vision into the oropharynx and esophagus.  The esophagus was  normal proximally, but tapered to a exudate-covered stricture with some  overlying whitish food particles that made visualization somewhat  difficult.  I could see small lumen with narrowing to a pinhole at about  30 cm.  The scope would not pass the stricture.  A guidewire was placed  through the stricture under fluoroscopic visualization; this went quite  easily into the stomach.  Savary dilators of 9-, 10- and 11-mm were  passed with mild to moderate resistance and minimal blood seen on the  last dilator only.  The scope was reintroduced and there was a bloody  appearance of the level of stricture and still with some retained food  particles and I was not able to safely attempt passage of the standard  endoscope beyond the stricture.  The scope was then withdrawn and the  patient returned to the recovery room in stable condition.  She  tolerated the procedure well and there were no immediate complications.   IMPRESSION:  Persistent distal tight esophageal stricture dilated to 11  mm.   PLAN:   Will need repeat in endoscopy with dilatation approximately every  2 weeks while staying on double-dose proton pump inhibitor and eventual  goal of dilating up to at least 14-16 mm.           ______________________________  Everardo All. Madilyn Fireman, M.D.     JCH/MEDQ  D:  07/17/2006  T:  07/18/2006  Job:  [id number]

## 2010-12-16 NOTE — H&P (Signed)
[place]. [facility]  Patient:    [name] [name], [name] [name] Visit Number: [id number] MRN: [id number]          Service Type: MED Location: 5500 5530 01 Attending Physician:  Doneta Public Dictated by:   Raynelle Jan Admit Date:  06/24/2001 Discharge Date: 06/27/2001                           History and Physical  CHIEF COMPLAINT: Nausea and epigastric pain.  HISTORY OF PRESENT ILLNESS: Ms. Mindi Curling is a 45 year old African-American female, with multiple admissions in the past for DKA, who presents to the ED from her primary M.D.s office secondary to nausea, epigastric pain, and "an irregular heart rate."  She states that she was doing well up until one day prior to admission, when she developed the epigastric pain and nausea.  Of note, she ran out of her diabetic medications four days prior to admission and on arrival to [facility] her CBG today was 463.  She denies any fever, chills, upper respiratory symptoms, shortness of breath, palpitations, change in her bowel movement, diarrhea, constipation, or melena.  She denies any vaginal discharge or dysuria.  PAST MEDICAL HISTORY:  1. Diabetes mellitus, type 1, with frequent admissions for DKA, and history     of medication noncompliance.  2. Polysubstance abuse, significant for alcohol, THC, tobacco, and cocaine     use.  3. Pancreatitis.  4. GERD and peptic ulcer disease, with an EGD in April 2002.  5. Alcoholic gastritis.  6. Diabetic gastroparesis.  7. 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  8. Pneumonia.  CURRENT MEDICATIONS:  1. Insulin 70/30 15 units in the morning, 18 units at night.  2. Protonix 40 mg p.o. q.d.  3. Phenergan 12.5 mg p.o. q.6h p.r.n.  ALLERGIES: CODEINE.  FAMILY HISTORY: Her mother is deceased from lung cancer, diabetes.  Her father is deceased from a stroke.  She has a sister with breast cancer and another sister with gastric cancer.  SOCIAL  HISTORY: Currently she is living with her sisters and her daughter. She smokes half a pack per day.  Denies any alcohol use for four months now. She last used marijuana and cocaine four days prior to admission.  REVIEW OF SYSTEMS: Per the HPI.  Also significant for poor appetite, weakness, nausea, vomiting, and abdominal pain.  PHYSICAL EXAMINATION:  VITAL SIGNS: TEMP 98.7 degrees, BP 137/78, pulse 126 and regular, respirations 22.  O2 saturation 99% on room air.  GENERAL: She is sedated but oriented to person and place.  She is tearful during the examination.  HEENT: No scleral icterus.  Oropharynx shows dry mucous membranes.  Throat without erythema or exudate.  NECK: Significant for some shotty lymphadenopathy in left cervical chain. Supple.  No thyromegaly.  CARDIOVASCULAR: She is tachycardic with a regular rate and rhythm.  No murmurs, rubs, or gallops.  LUNGS: Clear to auscultation bilaterally with good air movement throughout.  ABDOMEN: Soft.  Tender in epigastrium and right upper quadrant with voluntary guarding but no rebound.  Positive bowel sounds.  No hepatosplenomegaly or masses.  PELVIC: Significant for some yellowish colored discharge at the vault, but no cervical motion tenderness, adnexal tenderness, or masses.  RECTAL: Soft brown stool in rectal vault which is heme negative.  EXTREMITIES: No clubbing, cyanosis, or edema.  Capillary refill less than two seconds.  NEUROLOGIC: Intact.  LABORATORY DATA: Laboratories on admission significant  for ABG of 7.391, pCO2 33.7, pO2 98, saturation 98%.  CBC shows a WBC of 13.5 and H&H of 11.9 and 34.4; platelets 511,000; left shift with ANC of 13.2.  Comprehensive metabolic panel significant for a potassium of 3.2, BUN 21, creatinine 1.3.  Total bilirubin elevated at 1.6.  Albumin 3.4.  Lipase elevated at 57, amylase 115. Blood alcohol level less than 5.  Serum ketones trace.  Urinalysis and urine drug screen both  currently pending.  EKG shows sinus tachycardia with some T wave inversions in the inferior leads. Chest x-ray shows no active disease.  ASSESSMENT/PLAN: This is a 45 year old female, with diabetes mellitus, here with mild diabetic ketoacidosis and epigastric pain thought to be secondary to pancreatitis versus gastritis versus the diabetic ketoacidosis.  1. Diabetic ketoacidosis.  We will aggressively hydrate and replace her     potassium.  She will be started on insulin drip and we will add D5 to her     fluids until her CBG is less than 200.  We will monitor her ABGs until her     anion gap closes and will follow her CBGs closely.  2. Abdominal pain, questionable pancreatitis due to her mild elevation in     lipase versus gastritis versus pain secondary to the diabetic     ketoacidosis.  We will hydrate her, give morphine for pain control, add     Protonix to her regimen, give her sips of clears only, and will check an     acute abdominal series due to her nausea and vomiting.  3. Electrocardiogram changes.  We will rule her out with enzymes and add     aspirin to her regimen.  4. Peptic ulcer disease.  She will be placed on Protonix.  Will monitor for     any bleeding.  5. Polysubstance abuse.  We will encourage her to seek detox and therapy as     her substances likely add to her noncompliance. Dictated by:   Raynelle Jan Attending Physician:  Doneta Public DD:  06/26/01 TD:  06/26/01 Job: [id number]

## 2010-12-16 NOTE — Discharge Summary (Signed)
[place]. [facility]  Patient:    [name] [name], [name] [name] Visit Number: [id number] MRN: [id number]          Service Type: MED Location: 5500 5533 01 Attending Physician:  Phifer, Trinna Post Dictated by:   Gertha Calkin, M.D. Admit Date:  03/29/2001 Discharge Date: 04/02/2001                             Discharge Summary  DATE OF BIRTH:  March 13, 1966  DISCHARGE DIAGNOSES:  Diabetic ketoacidosis secondary to medicinal noncompliance.  DISCHARGE MEDICATIONS:  1. Insulin 70/30 15 units q.a.m., 15 units q.h.s.  2. Protonix 40 mg p.o. q.d.  3. Phenergan 12.5 mg one to two pills p.o. q. 4-6h p.r.n. nausea.  HISTORY OF PRESENT ILLNESS:  A 45 year old African-American female with diabetes mellitus who has been seen at [facility] several times for DKA presents again with DKA picture. The patient felt fine until yesterday; however, last night she began to feel nauseous and began vomiting. Unable to elicit really good history; however, her daughter was there and said she took her insulin yesterday but did not take insulin this morning. CBG in the ED was greater than 700. The patient has intense abdominal and back pain with painful breathing as well and very tachypneic.  PAST MEDICAL HISTORY:  1. Diabetes type 1.  2. History of DKA last episode was last week August 21 to March 24, 2001.  3. Substance abuse, uses alcohol, cocaine and ______ in the past.  4. Pancreatitis.  5. Esophageal reflux/bleeding ulcers.  6. Ethanol gastritis.  7. Stroke back in 2000/2001 unsure; however, it was left sided but she     regained all her motor function.  8. Pneumonia.  ALLERGIES:  CODEINE.  MEDICATIONS:  As listed above.  FAMILY HISTORY:  Mother died with diabetes mellitus and lung cancer unknown age. Father died with stroke unknown age. Sister has breast cancer and second sister has stomach cancer. The second sister with the stomach cancer is 56 years old.  SOCIAL  HISTORY:  Lives in [place] with two sisters and 28 year old daughter. Tobacco history of 5-6 packs years. Ethanol abuse but not since April of 2002. History of cocaine and marijuana use.  REVIEW OF SYSTEMS:  Per HPI.  PHYSICAL EXAMINATION:  VITAL SIGNS:  Blood pressure is 120/53, pulse 62, respirations 32, O2 saturations 92% on room air.  GENERAL:  She is in severe pain groaning and cannot get comfortable. Incoherent and with ______ respirations.  HEENT:  Normocephalic, atraumatic. PERRLA. EOMI. Sclera was clear.  NECK:  Supple, no LAD, no JVD.  CVS:  Regular rate, no murmurs, rubs or gallops; however, tachycardic during exam.  LUNGS:  Clear to auscultation bilaterally, however, she was using accessory muscles to breath.  ABDOMEN:  Soft, exquisitely tender to right palpation. No hepatosplenomegaly, voluntary guarding.  RECTAL:  Heme negative, good tone. Stool was present in the vault.  EXTREMITIES:  No cyanosis, clubbing or edema, 5/5 strength with 2+ pulses upper and lower extremities.  NEUROLOGIC:  The patient was alert and oriented x 3. Cranial nerves were grossly intact 2-12.  LABORATORY DATA:  White cell count of 17.8, hemoglobin of 11.3, glucose of 528, MCV of 95, anion gap was 35, calcium was 9.3, magnesium was 2.9 and phosphorus was 9.1. Amylase was 247, lipase was 327, total protein of 7.7, albumin 3.5, total bilirubin of 2.8, AST of 56, ALT of 18, alkaline phosphatase of  130, ABG on admission was 7.027. His CO2 was 7.5, O2 was 142, bicarb of 2, 98% on room air. UA with specific gravity of 1.027, pH of 5.0, ______ and nitrites were both negative. There was 30 proteins and glucose greater than 1000, ketones greater than 80.  Chest x-ray showed no acute disease. EKG showed an inverted T wave in lead 2 and peaked T waves throughout.  PROBLEM LIST:  1. DKA. Treat her per protocol with aggressive fluids initially on insulin     drip, monitor CBGs q. 1, BMPs q.  2. Continue to aggressively hydrate     until anion gap closed at which point we hydrated IV fluids at 250 cc     per hour until she tolerated p.o. intake. Regarding her hyperglycemia,     we monitored CBGs as well as serum gluocosed with her BMPs until they     came down to 250 at which point, we switched her over to long acting     insulin. Her home dose and one hour after administration of her first     dose of long acting insulin, we discontinued the insulin drip and     maintained her on her home regimen along with a sliding scale insulin.     She tolerated p.o. intake the next day and next afternoon, she had     solved any abdominal pain and was not requiring any IV pain medicines.  2. Pancreatitis. Amylase and lipase levels both slowly declined and as I     mentioned, she no longer needed IV pain medicines and on discharge was not     requiring any p.o. pain medicines as well. Tolerating solid foods with     no nausea, vomiting, or abdominal pain.  3. Anemia. Hemoglobin dropped secondary to her aggressive fluid rehydration;     however, remained stable and had no problems on discharge, no complaints,     no clinical symptoms of anemia.  4. Alcoholic gastritis, esophageal reflux/bleeding ulcers. We started her     on her Protonix at discharge as her anemic state had remained stable and     educated her on her alcohol use and the necessity for her to stop.  7. Substance abuse. Again gave her information on ADS and further education     on her necessity to stop.  On discharge she was stable. There were no instructions on activity. Diet was to be diabetic diet, approximately 2000 calories per day. Wound care not applicable.  SPECIAL INSTRUCTIONS:  N/A.  FOLLOW-UP:  With Dr. ______ at [facility] on September 16, Monday at 4 p.m. Dictated by:   Gertha Calkin, M.D. Attending Physician:  Phifer, Trinna Post DD:  04/22/01 TD:  04/23/01 Job: [id number]

## 2010-12-16 NOTE — H&P (Signed)
[place]  Patient:    [name] [name], [name] [name] Visit Number: [id number] MRN: [id number]          Service Type: MED Location: [id number] Attending Physician:  Doneta Public Dictated by:   Harrold Donath, M.D. Admit Date:  06/24/2001 Discharge Date: 06/27/2001                           History and Physical  CHIEF COMPLAINT:  Abdominal pain and emesis.  HISTORY OF PRESENT ILLNESS:  Patient is a 45 year old African American female with multiple admission for diabetic ketoacidosis secondary to noncompliance, polysubstance abuse, and pancreatitis/gastritis.  She presents today with abdominal pain and protracted emesis since 11 a.m. today.  No hematemesis per patient but states it is bilious in color.  I am unable to retrieve any other history due to influence of cocaine and possibly alcohol.  Remainder of history and physical is per patients charts from prior admissions.  PAST MEDICAL HISTORY: 1. Diabetes, type 1, with multiple admissions, noncompliance. 2. Polysubstance abuse, in particular cocaine, alcohol, marijuana, tobacco. 3. Pancreatitis (possibly secondary to alcohol). 4. Gastroesophageal reflux disease/peptic ulcer disease.  Her last EGD was in    April of 2002 and showed grade 2 to 3 esophagitis. 5. Alcoholic gastritis. 6. Diabetic gastroparesis. 7. Cerebrovascular accident in either 2000 or 2001 on the left side and    patient has return of function.  MEDICATIONS: 1. Protonix 40 mg p.o. b.i.d. 2. Iron sulfate 325 mg p.o. b.i.d. 3. Phenergan 12.5 mg p.o. q.6h. p.r.n. 4. NPH insulin 14 units subcu in the morning and 22 units subcu in the    evening.  ALLERGIES:  CODEINE.  SOCIAL HISTORY:  Per last admission, the patient lives with two sisters and her daughter in [place].  Today, she is in the emergency department with a female "friend."  Admits to tobacco.  No alcohol per her last admission.  She also admits to cocaine and  marijuana.  She denies any IV drug use or heroin.  FAMILY HISTORY:  Her mother died of lung cancer and also had diabetes.  Her father died of cerebrovascular accident.  One sister has breast cancer and the other sister has gastric cancer.  PHYSICAL EXAMINATION:  VITAL SIGNS:  Temperature 98.9.  Heart rate was 110 and on reexamination, 114. Blood pressure initially 170/91, then 170/98.  Respirations 24, then 28 and she was saturating 100% on room air.  GENERAL:  She is somnolent and awakened for questions but did not answer consistently.  She was oriented x3.  HEENT:  Pupils are sluggish.  Tympanic membranes are clear bilaterally. Oropharynx was clear without erythema or exudate.  She has relatively good dentition.  NECK:  No lymphadenopathy.  No thyromegaly.  No JVD.  She does have external jugular IV in place.  CARDIOVASCULAR:  Heart sounds were distant but she was regular rate and rhythm without murmurs and she had +2 peripheral pulses bilaterally.  LUNGS:  Decreased breath sounds secondary to effort, otherwise, was clear to auscultation bilaterally.  ABDOMEN:  Positive bowel sounds.  Soft.  She was tender to palpation in the left upper quadrant and right upper quadrant.  She has a palpable liver approximately 2 cm below the costal margin.  No splenomegaly.  No rebound or guarding and she was nondistended.  RECTAL:  Exam showed no masses, guaiac negative, but she had hard stool in the vault and so it was difficult  to obtain a specimen.  EXTREMITIES:  No clubbing, cyanosis, or edema.  No track marks.  NEUROLOGIC:  Her cranial nerves were difficult to assess secondary to noncompliance.  Her strength was +5.  Her deep tendon reflexes were +2 and her sensory exam was grossly intact.  PELVIC:  Deferred.  LABORATORY AND ACCESSORY DATA:  CBC:  White blood cell count 17.4, H&H 14.3 and 41.5, platelets 331,000; differential was 94% segs, 5% lymphocytes, 1% monocytes.  CMP:  Her  sodium 139, potassium 2.1, chloride 97, bicarb 29 and BUN 11, creatinine 0.8, glucose 88, calcium 9.9, total protein 8.5, alkaline phosphatase 88, albumin 3.7, AST 30, ALT 14, bilirubin 1.3.  Her urinalysis had a specific gravity of greater than 1.040; she had ketones greater than 80, protein greater than 300, small leukocyte esterase, 0-2 white blood cells, 3-6 red blood cells, a few bacteria and many epithelial cells.  Her lipase was 14.  Her urine drug screen was negative for TCAs but positive for marijuana and positive for cocaine.  Her chest x-ray showed no acute disease.  Her abdominal x-ray showed no acute disease.  ASSESSMENT AND PLAN:  This is a 45 year old African American with abdominal pain, emesis, polysubstance abuse and has a history of diabetes mellitus, type 1.  1. Abdominal pain/emesis:  This may be secondary to pancreatitis (acute on    chronic) versus alcoholic gastritis versus pregnancy versus substance abuse    versus biliary disease versus cardiac source versus diabetic gastroparesis.    Her white blood cells were increased on admission, her lipase was    decreased and she is cocaine-positive and has a negative abdominal x-ray.    Plan is to keep patient n.p.o. except for medications and start maintenance    fluids.  Will start Phenergan 25 mg intravenously q.4-6h. x12 hours, then    space at q.6h. p.r.n.  Will give morphine 1 mg intravenously q.4-6h. for    pain p.r.n.  Will check an amylase, electrocardiogram, alcohol level,    guaiac her stools x3, and check an abdominal CT with intravenous contrast    to evaluate for pancreatitis or biliary disease.  Will consider an    abdominal ultrasound as well. 2. Polysubstance abuse:  Patient is positive for cocaine and marijuana on    admission.  Will check an alcohol level.  Will consult social work since    patient has a positive urine drug screen.  We will also start folate and    thiamine. 3. Diabetes mellitus,  type 1:  Patient has a normal glucose of 88 on     admission.  We will cover her with a sliding-scale insulin and capillary    blood glucoses q.a.c. and h.s. for now.  We will check a hemoglobin A1c as    well. 4. Fluids, electrolytes and nutrition:  Patient is to be kept n.p.o. secondary    to her emesis.  She was noted to be dehydrated on admission with her    specific gravity greater than 1.040.  She was given 600 cc of normal saline    in the emergency department.  We will start D-5 one-half normal saline at    125 per hour with 20 of Kay Ciel and also replace her potassium, since it    was 3.1 on admission.  Keep strict intakes and outputs.  Nursing will need    to document emesis while the patient is in house. 5. Infectious disease:  Patient is afebrile on admission but  has increased    white blood cell count.  This may be from pancreatitis.  She currently has    no source; her urinalysis only showed a few bacteria and no white blood    cells.  We will send the urine for culture.  Her chest x-ray was negative.    We will check daily complete blood counts.  Hold on antibiotics until there    is a source or if patient becomes febrile.  Also consider a wet prep and    gonorrhea and chlamydial probes if the patient continues to have increased    white blood cell count or becomes febrile. 6. Social:  Patient has an unclear social situation.  I am unsure who her    "female friend" is who accompanied her.  He denies being her significant    other.  We will check a urine pregnancy.  It is also unclear if the patient    is an intravenous drug user so we will also check human immunodeficiency    virus.  If the patient does continue to have increased white blood cell    count, would also consider wet prep and gonorrhea and chlamydial probes    secondary to her social situation.  Also, thank you to case    management/social work for consult. Dictated by:   Harrold Donath, M.D. Attending  Physician:  Doneta Public DD:  09/18/01 TD:  09/18/01 Job: [id number]

## 2010-12-16 NOTE — Consult Note (Signed)
NAME:  [name] [name]                    ACCOUNT NO.:  [id number]   MEDICAL RECORD NO.:  [id number]          PATIENT TYPE:  INP   LOCATION:  5532                         FACILITY:  [facility]   PHYSICIAN:  James L. Randa Evens, M.D. DATE OF BIRTH:  Jul 06, 1966   DATE OF CONSULTATION:  07/06/2006  DATE OF DISCHARGE:                                 CONSULTATION   We were asked to a consult today by [facility], Dr. Darene Lamer, on the  patient [name] [name], date of birth February 25l, 1967.  The consult is for  continuous nausea and vomiting along with a possible esophageal  stricture.  Today's date is the date of consult, July 06, 2006.   HISTORY OF PRESENT ILLNESS:  This is a 45 year old female, a  polysubstance abuser with type 1 diabetes, with recurrent nausea and  vomiting x1 month.  She is complaining of regurgitation of solids  primarily.  She can drink clear liquids and they stay down.  She has  lost 20 pounds in the past month.  She reports cramping pain under her  right rib cage that wakes her at night.  She says she has dark urine,  pain around her right kidney.  She is positive for nausea and vomiting,  negative for diarrhea, negative for change in bowel habits, negative for  melena, negative for hematemesis, positive for GERD.  She reports that  she uses Pepcid and Mylanta at home but they do not  help anymore.   PAST MEDICAL HISTORY:  Significant for peptic ulcer disease, GERD,  pancreatitis, gastroparesis, gastritis, type 1 diabetes.  She had a CVA  and 2000 as well as 2001.  She has renal insufficiency, anxiety,  depression.  She has had no abdominal surgeries.   CURRENT MEDICATIONS:  Lantus, Zoloft, Phenergan, benazepril, Reglan,  oxycodone, Vistaril, Pepcid.   She has an allergy to CODEINE.   SOCIAL HISTORY:  She reports that she last used cocaine and marijuana  this past Wednesday.  She smokes approximately one pack of cigarettes  per week.  She has one 56 year old daughter.  She  used to work as a  Conservation officer, [employer].   FAMILY HISTORY:  Significant for a has a sister and a niece both with  stomach cancer, a sister with gallbladder disease, and her mother with  breast cancer.   PHYSICAL EXAMINATION:  GENERAL:  She is alert and oriented, in no  apparent distress.  She is pleasant to speak with.  CARDIAC:  Her heart has regular rate and rhythm.  LUNGS:  Clear to auscultation.  ABDOMEN:  Soft, thin, has positive bowel sounds.  No masses.  No bruits.   Her labs show hemoglobin that has dropped.  On December 6 it was 69, on  December 7 it was 10.9.  Do not know if this is due to hydration, lab  error, or genuine blood loss.  Her hematocrit was 47 on December 6, on  December it is 32.7.  Her Chem-7 is within normal limits with the  exception of creatinine of 1.4 and a glucose of 304.  Her UA shows  Trichomonas, clue cells, and wbc's.  A barium swallow done on December 7  shows distal esophageal stricture.  She had a HIDA scan done on November  20 that showed a 24% ejection fraction.  She had a CT done on November  13 that showed persistent distal esophageal wall thickening.   ASSESSMENT:  This is a 45 year old female with type 1 diabetes and  esophageal stricture, possible gallbladder disease as well.  Dr. Randa Evens  has seen and examined the patient and collected his history.  He will  perform upper endoscopy now and probably start her on clear liquids and  recommend a surgery consult for gallbladder disease.      Stephani Police, PA    ______________________________  Llana Aliment Randa Evens, M.D.    MLY/MEDQ  D:  07/06/2006  T:  07/07/2006  Job:  [id number]   cc:   Fanny Dance. Rankins, M.D.  Madaline Savage, MD

## 2010-12-16 NOTE — H&P (Signed)
NAME:  [name] [name], [name] [name]                    ACCOUNT NO.:  [id number]   MEDICAL RECORD NO.:  [id number]          PATIENT TYPE:  INP   LOCATION:  5511                         FACILITY:  [facility]   PHYSICIAN:  Jackie Plum, M.D.DATE OF BIRTH:  [birth year]   DATE OF ADMISSION:  06/02/2006  DATE OF DISCHARGE:                                HISTORY & PHYSICAL   CHIEF COMPLAINT:  Nausea, vomiting and diarrhea.   HISTORY OF PRESENT ILLNESS:  The patient is a 45 year old African-American  lady with history of diabetes mellitus, who presents with two days of  nausea, vomiting and diarrhea.  The onset is acute.  She presents today of  intently worsening symptoms with some general abdominal discomfort.  The  patient has not had any recent troubles or any contact with bad food.  She  is not having any chest pain or shortness of breath, or constant  micturition.  Her blood sugars have been [place].  She has not had any  headaches or rhinorrhea.   The patient was evaluated by EDP who requested admission on account of  worsening symptoms not relieved with antinauseants.   PAST MEDICAL HISTORY:  1. Diabetes type 1.  2. Anxiety.   PERMANENT HISTORY:  Positive for diabetes.   SOCIAL HISTORY:  The patient smokes 1/2 pack of cigarettes daily for several  years.  Denies any alcohol use.   ALLERGIES:  CODEINE.   MEDICATIONS:  Vistaril, Zoloft, Lantus.   PHYSICAL EXAMINATION:  VITAL SIGNS:  Blood pressure 158/97, pulse 139,  respirations 22, temperature 99.4.  GENERAL:  The patient is in no acute cardiopulmonary distress.  HEENT:  Normocephalic and atraumatic.  Pupils were equal, round and react to  light.  Extraocular movement intact.  Oropharynx moist.  NECK:  Supple; no JVD.  LUNGS:  Clear to auscultation.  CARDIAC:  The patient with mild multicardic; no gallop.  Rhythm was regular.  ABDOMEN:  Soft, nondistended with diffuse, mild generalized tenderness.  No  hepatosplenomegaly.  EXTREMITIES:  No  cyanosis and no edema.  NEUROLOGIC:  Nonfocal.   LABORATORY DATA:  Lab work was reviewed.  Of note, WBC 14.6, hemoglobin  13.1, hematocrit 19.8, MCV 85.9, platelet count 217.  Sodium 156, potassium  4.2, chloride 109, glucose 288, BUN 32, creatinine 1.7.  Urinalysis  indicated yellow clear urine, with more than 1000 mg/dL of glycosuria; more  than 80 mg/dL of proteinuria, moderate blood, protein more than 300 mg/dL;  negative nitrites and leukocytes.   IMPRESSION:  1. Gastroenteritis.  2. Renal insufficiency, related to diagnosis number 1 with some mild      dehydration.  3. Uncontrolled diabetes mellitus.   PLAN:  The patient is admitted for IV fluid and anti-nausea and anti-emetics  for supportive care with monitoring of her electrolytes.  The patient's  sugars should be followed carefully, and appropriate dispensing of her  antidiabetic regimen made.  There is a risk of hypoglycemia events if she  continues not to tolerate p.o.'s.  We will watch her carefully in suspicion  of possible hypoglycemic events.  If her glucose  stays Rick __________  fluid intake, as it is now, would optimize anti-diabetic medication at time  when a consistent trend gets realized.  Will get a KUB for completion of her  __________  abdominal discomfort.      Jackie Plum, M.D.  Electronically Signed     GO/MEDQ  D:  06/03/2006  T:  06/03/2006  Job:  [id number]

## 2010-12-16 NOTE — Op Note (Signed)
NAME:  Mccune, Gaia                    ACCOUNT NO.:  [id number]   MEDICAL RECORD NO.:  [id number]          PATIENT TYPE:  AMB   LOCATION:  ENDO                         FACILITY:  [facility]   PHYSICIAN:  John C. Madilyn Fireman, M.D.    DATE OF BIRTH:  26-Jul-1966   DATE OF PROCEDURE:  07/30/2006  DATE OF DISCHARGE:                               OPERATIVE REPORT   INDICATIONS FOR PROCEDURE:  Dysphagia with known esophageal stricture.   PROCEDURE:  The patient was placed in the left lateral decubitus  position and placed on the pulse monitor with continuous low-flow oxygen  delivered by nasal cannula.  She was sedated with 75 mcg IV of fentanyl  and 7 mg IV Versed.  The Olympus video endoscope was advanced under  direct vision into the oropharynx and esophagus.  The esophagus was  straight and of normal caliber with squamocolumnar line at 38 cm  embedded in a short stricture with mild inflammation.  The scope went  through it with some resistance and some friability and possibly a small  laceration induced by passage of the scope through it.  The stomach was  entered.  A small amount of liquid secretions were suctioned from the  fundus.  Retroflexed view of cardia was unremarkable.  The fundus, body,  antrum and pylorus all appeared normal.  The duodenum was entered, and  both the bulb and second portion were well-inspected and appeared to be  within normal limits.  The Savary guidewire was placed through the  endoscope channel and the scope withdrawn.  A 12.8-mm Savary dilator was  passed over the guidewire with moderate resistance and some blood seen  on withdrawal.  The dilator was removed together with the wire, and the  patient returned to the recovery room in stable condition.  She  tolerated the procedure well, and there were no immediate complications.   IMPRESSION:  Persistent distal stricture with improvement in diameter  and improvement in inflammation from previous study.   PLAN:  Will  continue Protonix.  Follow up in the office, and we will  probably need one or two more repeat dilatations, ideally up to about 16  mm.           ______________________________  Everardo All. Madilyn Fireman, M.D.     JCH/MEDQ  D:  07/30/2006  T:  07/30/2006  Job:  [id number]   cc:   Fanny Dance. Rankins, M.D.

## 2010-12-16 NOTE — Discharge Summary (Signed)
[place]. [facility]  Patient:    Gautreau, Jorgina                             MRN: [id number] Adm. Date:  [id number] Disc. Date: [id number] Attending:  [name] Dictator:   Myles Rosenthal, M.D. CC:         [facility]   Discharge Summary  DISCHARGE DIAGNOSES: 1. Diabetic ketoacidosis. 2. Insulin-dependent diabetes mellitus. 3. Alcohol gastritis/espohagitis. 4. Mild pancreatitis. 5. Anemia. 6. Polysubstance abuse.  DISCHARGE MEDICATIONS: 1. Protonix 40 mg p.o. q.d. 2. Phenergan 25 mg p.o. q.6h. p.r.n. vomiting. 3. Tylenol 325 mg two tablets q.4-6h. p.r.n. pain. 4. Insulin 70/30 16 units q.a.m. and 12 units with dinner.  SPECIAL INSTRUCTIONS:  The patient is advised to restrict activity to bed rest for at least three to four days following discharge.  DIET:  She is strongly advised to supplement her diet with Boost, Ensure or other protein supplements.  In addition to her gastritis, she has been given a list of foods to avoid that includes spicy foods, fatty, greasy, acitic foods, orange juice, lemon juice, onions, garlic, cranberry juice, chocolate, etc.  FOLLOWUP:  Ms. Shilling has an appointment on Monday, April 22, at 2 p.m. at [facility] with a Child psychotherapist.  It is the understanding that although [facility] does not have any recertification exams, following this appointment with the social worker, Ms. Mclamb will be able to once again receive her medications from [facility] starting Monday, April 22, as long as she attends this meeting.  The entire reason that Ms. Ragle had this past hospital stay that she was not able to pick up her medicines because she had not been able to attend her latest recertification exam.  If there are any questions about this arrangement, please call Dominica Severin, M.S.-IV, at [phone].  The patient also has a follow-up appointment with Dominica Severin, M.S.-IV at the [facility] on Tuesday, April 23, at  2:15 p.m. During this appointment, a basic metabolic panel will be drawn to make sure that the patient is not in acidosis.  In addition, we will make sure the patient has been compliant on her insulin and has been taking her other medications.  The patients blood sugar will be checked and her insulin regimen will be carefully assessed.  Physical exam will focus on whether the patient has epigastric tenderness, whether or not she is dehydrated and whether she has begun to regain some of her strength.  It should also be noted that arrangements have been made through case management to obtain medications for the patient from the time of discharge on April 20, through the time of the [facility] appointment on April 22, through the hospital pharmacys indigent fund so that the patient should not be without her medications through the weekend.  PROCEDURES: 1. On November 10, 2000, acute abdominal series radiograph showed nonspecific    bowel gas pattern without evidence of obstruction. 2. Chest x-ray showed clear lungs and mediastinal contours within normal    limits. 3. On April 16, computed tomography of the pelvis showed myomatous uterus,    small amount of gas in bladder correlating with recent instrumentation,    otherwise unremarkable CT of the pelvis. 4. Computed tomography of the abdomen showed no significant change since the    prior study of November 01, 2000.  CONSULTATIONS:  None.  HISTORY OF PRESENT ILLNESS:  Ms. Dlugosz is a 45 year old,  African-American woman with a past medical history significant for polysubstance abuse and IDDM who was discharged from [facility] on November 08, 2000, for erosive esophagitis who presents with a two-day history of severe nausea and vomiting.  She was discharged on Protonix, Carafate and Insulin 70/30 and admits to not having picked up her Protonix and Carafate.  She says she has been compliant on her 70/30 insulin.  Since she has been at home, she says she  has vomited dark emesis six to eight times and had diarrhea one to two times.  She denies bright red blood per rectum or dark tarry stools.  She says she has had chills, subjective fevers, headache and ongoing generalized weakness.  In addition, she says she has had a constant burning epigastric pain that radiates to her back which is not relieved by Mylanta.  Of note, the patient says she has not eaten or drunk anything since she has been at home.  She has smoked tobacco heavily and done one hit of crack.  She denies chest pain, but says she has been short of breath with exertion.  PAST MEDICAL HISTORY:  1. Severe esophagitis and alcoholic gastritis, status post [facility]     admission on October 30, 2000 to November 08, 2000.  2. Gastroesophageal reflux disease.  3. Insulin-dependent diabetes mellitus.  4. Polysubstance abuse including crack/cocain, alcohol and marijuana.  5. Hyperlipidemia.  6. Chronic pyelonephritis with history of recurrent UTI.  7. Normocytic anemia.  8. History of left lower lobe pneumonia.  9. History of DKA. 10. History of hypokalemia. 11. History of trichomoniasis. 12. Questionable history of alcoholic pancreatitis.  MEDICATIONS: 1. Carafate 1-2 tbs q.a.c. and q.h.s.  The patient is not taking this    medication. 2. Insulin 70-30 units at 7:30 a.m. and 20 units at 4:30 p.m. 3. Protonix 40 mg p.o. b.i.d.  The patient is not taking this medication.  ALLERGY:  CODEINE.  SOCIAL HISTORY:  The patient lives with her sister.  Her only daughter lives with her part of the year, but recently left to live with her father.  This precipitated her alcohol binge prior to her last [facility] admission.  The patient is very tearful recently secondary to the departure of her daughter and the 10-year anniversary of her mothers death.  She does not work currently, but she has worked recently at [employer].  She had not been drinking alcohol since January until her March  admission.  She smokes crack  approximately every other day and smokes about five cigarettes per day.  FAMILY HISTORY:  Mother with breast cancer at age 66.  Diabetes mellitus in her aunt.  PHYSICAL EXAMINATION:  VITAL SIGNS:  Temperature 98.5, pulse 137, respiratory rate 16, oxygen at 99% on room air.  Blood pressure supine 146/88, pulse 135; blood pressure standing 111/78 and pulse 140.  GENERAL:  This is a cachectic, African-American woman lying in bed crying, vomiting intermittently in mild distress.  HEENT:  Normocephalic, atraumatic, extraocular membranes intact.  Funduscopic exam with no AV nicking.  No scleral icterus.  Oropharynx clear without exudate, good dentition.  Mucous membranes dry.  NECK:  No JVD.  No bruits, thyromegaly or lymphadenopathy.  CHEST:  Clear to auscultation bilaterally.  CARDIOVASCULAR:  Regular rate and rhythm without murmurs, rubs or gallops. Pulses 1+ in upper and lower extremities.  ABDOMEN:  Soft, positive tenderness in the epigastrium midline without rebound.  Positive guarding.  No masses or hepatosplenomegaly.  Bowel sounds positive, but  diminished.  BACK:  There is paraspinal tenderness bilaterally in the thoracolumbar spine. There is no CVA tenderness.  NEUROLOGIC:  The patient is alert and oriented x 4.  Cranial nerves II-XII intact.  Reflexes are 2+ in upper and lower extremities.  Toes are downgoing.  LABORATORY DATA AND X-RAY FINDINGS:  ABG with pH 7.42, pACO2 29, pAO2 101. Sodium 136, potassium 3.9, chloride 85, bicarb 22, BUN 21, creatinine 1.6, glucose 496.  Anion gap 29.  White blood cell count 14.6, hemoglobin 11.7, hematocrit 34.8, platelets 416, ANC 12.6, ALC 0.7, AMC 0.7, AEC 0.0, ABC 0.0. Total bilirubin 3.1, direct bilirubin 0.2, Alk phos 89, AST 16, Alk phos 11, total protein 7.2, albumin 3.5, calcium 9.8, magnesium 2.2 phosphorus 5.5. Lipase 16, amylase 51.  Blood acetone small.  Serum alcohol level less than 10.   Urinalysis within normal limits except ketones greater than 80 and glucose greater than 1000.  Urine drug screen positive for cocaine and opiates.  Salicylates less than 4.0.  HOSPITAL COURSE:  #1 - DIABETIC KETOACIDOSIS:  When the patient came in, she was found to be in diabetic ketoacidosis.  This was the case even though her overall ph was alkalotic.  Upon close analysis, it was found that she likely had several acid based disorders going on simultaneously.  First of all, she had an anion gap metabolic acidosis secondary to the ketones from her diabetes and perhaps also from her alcoholism and starvation.  In addition, she had metabolic alkalosis from her vomiting.  The patient was initially begun on an insulin sliding scale and vigorous fluid resuscitation after which her anion gap resolved.  However, after one day of resolution, the patient was eating poorly and the anion gap opened again.  We started her on an insulin drip with a second round of vigorous rehydration and the patients anion gap once again closed for the remainder of her hospital stay.  The patient did become mildly hypokalemic.  By day #3 of her hospital stay, her potassium was 3.2.  This resolved with 80 mEq of K-Dur.  We assessed the patient for possible infectious triggers of her DKA with blood cultures, urine cultures and chest x-ray that were negative.  #2 - NAUSEA, VOMITING AND DIARRHEA:  These symptoms were likely of multifactorial etiology in this patient.  In addition to the patients DKA, the patient had alcoholic gastritis which was untreated at the time of admission given that she had not been taking her medications.  Also, the patient was found to have a normal lipase on admission, but on day #2 of her hospital stay, her lipase was found to be mildly elevated at 60.  Although her CT of the abdomen did not show any acute pancreatic process, it was thought to be the patient had a mild alcoholic pancreatitis  in addition to her gastritis. The lipase had decreased to 17 by the day prior to admission.  Liver function tests, other than indirect bilirubin, were normal and all other abdominal and pelvic organs were normal on ultrasound.  Zofran was discontinued two days prior to admission with minimal emesis.  The patient was discharged on Zofran.  #3 - INSULIN-DEPENDENT DIABETES MELLITUS:  The patient described her diabetes as follows:  She had gestational diabetes in pregnancy at the age of 13.  She was begun on insulin at the age of 22 and told that she was insulin dependent. 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  As mentioned above, the patient was first begun on sliding scale, switched to an insulin drip and then later maintained on 12 units NPH q.a.m. and q.p.m. with a sliding scale.  The patient was discharged on insulin 70/30.  This regimen will have to be followed closely as an outpatient.  #4 - ALCOHOLIC GASTRITIS/ESOPHAGITIS:  This was the primary reason for the patients previous admission.  Both of these were diagnosed by upper endoscopy.  The patient complained of substernal and epigastric pain throughout her hospital stay.  These were worse with eating.  We continued the patients Carafate and Protonix and discharged the patient on Protonix alone. In addition, the patient was given Darvocet in the hospital, but given her substance abuse history, we discharged her on Tylenol.  #5 - ANEMIA:  The patient has a normocytic anemia and is found to be chronic as demonstrated by old hospital records.  It is consistent with an erosive esophagitis and gastritis.  Given these findings, we did not treat her anemia.  #6 - POLYSUBSTANCE ABUSE:  The patient has serious and current addictions to crack, alcohol and tobacco which are directly inhibiting any progress she can make with her gastritis and diabetes.  In addition to being  extremely psychologically damaging, substance abuse was discussed at length with the patient by both the M.S.-IV and the social worker and options for treatment were discussed at the time of discharge.  DISCHARGE LABORATORY DATA AND X-RAY FINDINGS:  WBC 6.7, hemoglobin 11.0,  hematocrit 33.0, MCV 90.6, RDW 13.3, platelets 457, ANC 3.9, ALC 2.1, AMC 0.5, AEC 0.1, ABC 0.0.  Sodium 130, potassium 3.7, chloride 92, bicarb 31, glucose 317, BUN 11, creatinine 0.8, calcium 9.0.  Dictated by: Dominica Severin, M.S.-IV DD:  11/18/00 TD:  11/18/00 Job: [id number]

## 2010-12-16 NOTE — Discharge Summary (Signed)
[place]. [facility]  Patient:    [name] [name], [name] [name] Visit Number: [id number] MRN: [id number]          Service Type: MED Location: MICU 2102 01 Attending Physician:  Phifer, Trinna Post Dictated by:   Emelda Fear, M.D. Admit Date:  05/28/2001 Discharge Date: 05/30/2001                             Discharge Summary  DATE OF BIRTH:  [birth year]  CONSULTATIONS:  None.  PROCEDURES:  None.  DISCHARGE DIAGNOSES: 1. Diabetic ketoacidosis. 2. Abdominal pain. 3. Trichomonas vaginitis. 4. Normocytic anemia with history of iron-deficiency anemia. 5. Polysubstance abuse.  DISCHARGE MEDICATIONS: 1. Insulin 70/30 fifteen units q.a.m. and 15 units subcu q.h.s. 2. Protonix 40 mg 1 tablet p.o. q.d. 3. Phenergan 12.5 mg 1-2 tablets p.o. q.6h. p.r.n. nausea. 4. Ferrous sulfate 325 mg 1 tablet p.o. b.i.d.  HISTORY OF PRESENT ILLNESS:  Ms. Zellner is a 45 year old African-American female with a past medical history significant for diabetes mellitus type 1 with several previous hospitalizations for DKA who is presenting via EMS secondary to a one-day history of nausea, vomiting, and weakness.  The patient also complains of abdominal pain on presentation.  On presentation, the patients blood glucose was 777.  Sodium was 136, potassium 5.1, chloride 99, and CO2 of 16 with an anion gap of 21.  Lipase upon admission was 72.  The patient had elevated liver enzymes including an AST of 46, an ALT of 16, an alkaline phosphatase of 191, and a total bilirubin of 2.5.  Urine drug screen on admission was negative.  Serum acetone was moderate.  Cardiac enzymes were negative upon admission.  Urinalysis was positive for glucose and ketones and negative indication of infection.  Chest x-ray on admission was within normal limits.  EKG revealed tachycardia, regular rhythm with no Q waves or ST changes of concern.  The patient was admitted to the medicine teaching service and  initiated on an insulin drip at 3 units per hour.  The patient was also initiated on aggressive IV hydration with 3 L of normal saline boluses followed by D5 half-normal saline.  The patient was also supplemented with potassium during her IV hydration.  On hospital day #1, the patients anion gap closed from 21 to 5 within the first 24 hours on admission.  The patient was discharged on hospital day #2 with a normal anion gap.  DISCHARGE PHYSICAL EXAMINATION:  GENERAL:  Well-developed, thin female in no acute distress.  Alert and oriented x 4.  HEENT:  Normocephalic, atraumatic.  Pupils are equal, round and reactive to light.  Sclerae non-icteric.  Mucous membranes are moist.  NECK:  Supple without lymphadenopathy.  CARDIOVASCULAR:  Tachycardic.  Regular rhythm, 2/6 systolic ejection murmur.  PULMONARY:  Clear to auscultation bilaterally.  No wheezing, no crackles.  ABDOMEN:  Soft, nontender, nondistended.  Positive bowel sounds.  Normoactive.  EXTREMITIES:  No clubbing, cyanosis, or edema.  ADMISSION PROBLEM LIST: 1. Diabetic ketoacidosis.  On presentation, the patient received 10 units of    regular insulin, followed by an insulin drip at 3 units per hour.  The    patient also received aggressive IV hydration with a total of four boluses    followed by IV fluids 200 cc per hour.  Initially, BMETs were checked every    two hours, followed by every four hours, then they were decreased to every  six hours.  CBGs were checked every two hours throughout admission.  On    hospital day #1, the patient did lose IV access without ability by the IV    team to retain IV access.  At that time of IV access loss, the patients    anion gap was within normal limits.  The patient was then hydrated orally    and CBGs were checked every two hours.  Her anion gap remained within    normal limits.  Etiology of diabetic ketoacidosis most likely secondary to    alcohol intake and drug use over the  weekend prior to presentation. 2. Abdominal pain.  The patient presented with acute abdominal pain on    presentation.  The patients initial pain was treated with Vicodin p.r.n.    for pain.  The patient had an elevated lipase of 81 on admission and    elevated LFTs.  Three-way abdominal films on admission were within normal    limits with no perforation or free air noted.  Ultrasound of abdomen was    within normal limits without evidence of gallstones or abnormalities of the    pancreas.  The patient tolerated p.o. without further nausea or vomiting on    hospital day #1. 3. Trichomonas vaginitis.  Secondary to abdominal pain on    presentation, the patient received a ______ examination and wet prep that    was positive for Trichomonas.  The patient was treated with MetroGel during    her admission. 4. Anemia.  The patient has a significant history of peptic ulcer disease,    gastritis, and gastroesophageal reflux disease.  The patient was continued    on Protonix during her admission.  Her hemoglobin remained within normal    limits throughout her admission with a discharge hemoglobin of 9.8.  The    patient was discharged on iron therapy secondary to her history of iron    deficiency. 5. History of gastroesophageal reflux disease, peptic ulcer disease, and    gastritis.  The patient was continued on Protonix throughout her    hospitalization.  Last EGD was performed in April 2002, which revealed    17 cm of moderate esophagitis.  The patient was discharged on Protonix.    This will be followed up as an outpatient. 6. Polysubstance abuse.  The patient reports recent alcohol and cocaine    ingestion prior to presentation.  The patient was spoken to by the medicine    teaching service team regarding her polysubstance abuse.  The patient    denied a social work consult and stated she had spoken with them during    previous admissions. 7. History of noncompliance.  The patient does have a  significant history of    noncompliance for insulin medication.  During this admission, she denies     noncompliance of her medications; however, an issue to keep in mind for    further admission.  DISCHARGE LABORATORY DATA:  Sodium 133, potassium 4.4, chloride 104, bicarb 24, BUN 9, creatinine 0.8.  Anion gap 5.  Ethyl alcohol less than 5. Cholesterol 221, triglycerides 228, HDL 60, LDL 115.  Abdominal ultrasound was within normal limits.  Magnesium level 1.7.  Lipase 83.  Salicylate level less than 4, acetaminophen 4.2.  Urine hCG negative.  Serum osmolality 359.  Lactic acid 1.8.  Wet prep positive for Trichomonas.  PT 11.7, INR 0.8, PTT 29.  Urine drug screen negative.  Serum acetone moderate.  Urinalysis:  Straw, clear, 1.034, 5.0, glucose greater than 1000, hemoglobin trace, bilirubin negative, ketones greater than 80, protein was negative, nitrite negative, leukocyte esterase negative (admission urinalysis).  TCA negative.  Alcohol less than 5.  CK 41, MB 0.7, troponin 0.02.  Acute abdominal series with chest films:  Mild increase in large bowel gas. No evidence of obstruction of perforation.  Some atelectasis, left lung base. Otherwise, normal chest x-ray.  DISCHARGE ACTIVITY:  No restrictions.  DISCHARGE DIET:  Recommend low sugar diabetic diet.  WOUND CARE:  Not applicable.  SPECIAL INSTRUCTIONS:  Recommend the patient call her doctor for the development of nausea or vomiting.  FOLLOW-UP:  The patient has an appointment with her physician at [facility] on November 6 at 8:15. Dictated by:   Emelda Fear, M.D. Attending Physician:  Phifer, Trinna Post DD:  05/30/01 TD:  05/31/01 Job: [id number]

## 2010-12-16 NOTE — Op Note (Signed)
NAME:  [name] [name], [name] [name]                    ACCOUNT NO.:  [id number]   MEDICAL RECORD NO.:  [id number]          PATIENT TYPE:  INP   LOCATION:  5532                         FACILITY:  [facility]   PHYSICIAN:  James L. Malon Kindle., M.D.DATE OF BIRTH:  21-Mar-1966   DATE OF PROCEDURE:  07/06/2006  DATE OF DISCHARGE:                               OPERATIVE REPORT   SURGEON:  Fayrene Fearing L. Randa Evens, M.D.   PROCEDURE:  Esophagogastroduodenoscopy with biopsies and cultures.   INDICATIONS:  A patient with severe reflux symptoms, dysphagia and chest  pain, who was admitted with nausea and vomiting.  She is an insulin-  dependent diabetic.  Barium swallow showed a marked narrowing with an  ulcerated mass effect of the lower esophagus.  This procedure is done to  obtain biopsies and to evaluate this area endoscopically.   DESCRIPTION OF PROCEDURE:  The procedure had been explained to the  patient and consent obtained.  Left lateral decubitus position.  The  Pentax  scope was inserted and advanced.  As soon as we entered the  esophagus, it was severely ulcerated with large plaque-type areas in the  esophagus.  We arrived at the distal esophagus, and there was marked  stricturing, which was severely ulcerated.  The scope would not pass.  We pulled back up and examined the more proximal esophagus, obtained  biopsies for viral culture for CMV and herpes, and biopsies and  brushings for fungal smear and culture.  We then biopsied the stricture  at the distal esophagus and sent it for routine path.  The scope was  withdrawn.  The patient tolerated the procedure well.  Her blood  pressure was a bit low after the procedure, but she was arousable.   ASSESSMENT:  Severe esophagitis with stricture and obstruction, possibly  due to a fungal infection or viral infection, or to even a possible  malignancy.   PLAN:  Will keep her on liquids, add Carafate slurry, check path results  and cultures.     ______________________________  Llana Aliment Malon Kindle., M.D.     Waldron Session  D:  07/06/2006  T:  07/07/2006  Job:  [id number]

## 2010-12-16 NOTE — Discharge Summary (Signed)
[place]. [facility]  Patient:    [name] [name] Visit Number: [id number] MRN: [id number]          Service Type: MED Location: CCUB 2901 01 Attending Physician:  Phifer, Trinna Post Dictated by:   Emelda Fear, M.D. Admit Date:  01/01/2002 Discharge Date: 01/04/2002                             Discharge Summary  DATE OF BIRTH: [birth year]  CONSULTATIONS: None.  PROCEDURES: None.  DISCHARGE DIAGNOSES: 1. Diabetic ketoacidosis. 2. Abdominal pain. 3. Peptic ulcer disease. 4. Gastritis. 5. Gastroesophageal reflux disease. 6. Trichomonas vaginitis. 7. Bacterial vaginosis. 8. Anemia, iron deficiency. 9. Polysubstance abuse. 10.Hyponatremia.  DISCHARGE MEDICATIONS: 1. Protonix 40 mg one tab p.o. b.i.d. 2. Ferrous sulfate 325 mg one tab p.o. b.i.d. 3. Phenergan 12.5 mg one tab p.o. q.6h. p.r.n. nausea. 4. Metronidazole 500 mg one tab p.o. b.i.d. times eight days. 5. NPH insulin 14 units subcutaneous a.m. and 22 units subcutaneous in p.m.  FOLLOW-UP: The patient is to call [facility] at [phone] for follow-up appointment in the upcoming week.  HISTORY OF PRESENT ILLNESS: [name] [name] is a 45 year old African-American female with a history of multiple admission for diabetic ketoacidosis who presents to the ER from her PCP secondary to nausea, epigastric pain, heart palpitations, and elevated glucose. The patient was admitted for diabetic ketoacidosis with a presenting anion gap of 17 and a pH of 7.391. The patient was initiated on insulin drip and received aggressive IV hydration. Resolution of diabetic ketoacidosis occurred throughout hospitalization stabilization of glucose. Secondary to epigastric pain and heart palpitations, the patient was ruled out for acute MI. EKG remained stable throughout hospitalization. Secondary to abdominal pain, pelvic exam was performed and patient diagnosed with trichomonas vaginitis as well as bacterial  vaginosis and received appropriate antibiotic during hospitalization.  DISCHARGE PROBLEM LIST: #1 - DIABETIC KETOACIDOSIS: The patient is status post aggressive IV hydration and insulin drip. The patient was tolerating p.o. intake without difficulty. The patient will be discharge on NPH 14 units in the a.m. and 22 units in the p.m. The patient is encouraged to have close follow-up with her PCP at [facility]. The patient has a long standing history of noncompliance and polysubstance abuse which is contributing to her recurrent diabetic ketoacidosis.  #2 - ABDOMINAL PAIN WITH NAUSEA, RESOLVED WITH RESOLVING DIABETIC KETOACIDOSIS.  #3 - PEPTIC ULCER DISEASE: The patient with recent imaging and abdominal series as well as EGD within the past one to two years. Will discontinue patient on Protonix therapy.  #4 - GASTRITIS: Continue patient on Protonix therapy.  #5 - GASTROESOPHAGEAL REFLUX DISEASE: Again, will discharge patient on Protonix therapy.  #6 - TRICHOMONAS VAGINITIS: The patient received 2 grams of Flagyl during hospitalization.  #7 - BACTERIAL VAGINOSIS: The patient will continue on Flagyl therapy for complete ten day course.  #8 - ANEMIA, IRON DEFICIENCY: A chronic problem in this menstruating female. The patient will be discharge on iron sulfate therapy. Discharge hemoglobin 11.9.  #9 - POLYSUBSTANCE ABUSE: Again, appreciate social work consult and encouragement per rehab and attendance of alcoholic anonymous.  #10 - EPIGASTRIC PAIN WITH HEART PALPITATIONS: Cardiac enzymes as follows: CK 34, 39, then 34. CKMB is 0.3, 0.3, then 1.3. Troponin I is 0.1, 0.1, and 0.1. DIAGNOSTIC STUDIES: Initial EKG revealing sinus tachycardia with questionable T wave inversion of the inferior leads.  #11 - HYPONATREMIA SECONDARY TO AGGRESSIVE  OVER HYDRATION WITH HYPOTONIC FLUIDS: Discharge sodium of 132 and stable.  LABORATORY DATA: Sodium 132, potassium 4.1, chloride 94, CO2 29,  BUN 16, creatinine 0.8, glucose 85. WBC is 11.8. H&H 11.9 and 35.1. Platelets 352. Urine culture negative. Blood culture negative to date. CK 34, then 39 and then 34. CKMB of 0.3, 0.3, then 1.3. Troponin I 0.01 then 0.01, then 0.01. Alcohol less than 5. Amylase 115, lipase 57, total bilirubin 1.6, albumin 3.4. Urinalysis on presentation was yellow and clear. Specific gravity was 1.036 with pH of 5.0 and glucose greater than 1,000. Ketones greater than 80. Protein 30. Urine drug screen positive for cocaine. Urine pregnancy test negative.  DIAGNOSTIC STUDIES: Abdominal series revealed no active disease. Chest x-ray no active disease. GC probe negative for Chlamydia. Wet prep as follows: WBC moderate. Trichomonas yeast none. Clue cells few. DISCHARGE CONDITION: The patient is stable with resolution of diabetic ketoacidosis.  RECOMMENDATIONS:  ACTIVITY: No restrictions.  DIET: Low sugar/diabetic diet.  WOUND CARE: Not applicable.  SPECIAL INSTRUCTIONS: The patient to call [facility] for blood sugar greater than 400, for nausea, or for vomiting. Dictated by:   Emelda Fear, M.D. Attending Physician:  Phifer, Trinna Post DD:  02/03/02 TD:  02/05/02 Job: [id number]

## 2010-12-16 NOTE — Discharge Summary (Signed)
NAME:  [name] [name], [name] [name]                    ACCOUNT NO.:  [id number]   MEDICAL RECORD NO.:  [id number]          PATIENT TYPE:  INP   LOCATION:  5506                         FACILITY:  [facility]   PHYSICIAN:  Wayne A. Sheffield Slider, M.D.    DATE OF BIRTH:  [birth year]   DATE OF ADMISSION:  05/15/2004  DATE OF DISCHARGE:  05/19/2004                                 DISCHARGE SUMMARY   DISCHARGE DIAGNOSES:  1.  Diabetes mellitus, type 2, insulin dependent.  2.  Diabetic ketoacidosis.  3.  _____.  4.  Hypertension.  5.  Gastroesophageal reflux disease.  6.  Gastritis.  7.  Peptic ulcer disease.  8.  History of alcohol abuse  9.  History of polysubstance abuse.  10. Alcoholic pancreatitis.  11. History of cerebrovascular accident in 2000, totally recovered.   PROCEDURES:  None.   MEDICATIONS:  1.  Insulin Humulin 70/30 subcutaneous injections 25 units 30 minutes before      breakfast and 35 units subcutaneously 30 minutes before dinner.  2.  Nicotine transdermal patch 21 mg, apply 1 patch every day for six weeks.  3.  Nicotine transdermal patch 14 mg, apply 1 patch every for two weeks.  4.  Nicotine transdermal patch 7 mg, apply 1 patch every day for two weeks.  5.  Ferrous sulfate 325 mg 1 tablet b.i.d. with meals.  6.  Acetaminophen 325 mg 1 tablet every 6 hours for pain as needed.   DIET:  Modified carbohydrate diet for diabetic.   HOSPITAL COURSE:  This is a 45 year old patient with history of diabetes  mellitus, type 2, insulin dependent.  She has had multiple admissions and  DKA, has been noncompliant with treatment or diet.  Also has a history of  polysubstance abuse and pancreatitis.  The patient missed two days doses of  insulin.  She went to [facility] and they were closed.  Also, she admits  using cocaine on Wednesday, so she came to the emergency department with DKA  with altered mental status and abdominal pain.   #1.  DIABETIC KETOACIDOSIS:  DKA was controlled with profuse  hydration,  given the first two days of admission.  The patient needed close monitoring  due to sinus tachycardia and tachypnea.  She responded pretty well to  treatment, but has been noncompliant on p.o. intake when she was in frank  acidosis and hydration, so this delayed recovery.  On May 16, 2004, the  patient had a Burdette glycemic episode _____ were 21 and was given D5 half  normal saline plus 40 mEq potassium.  Saline drip to decreased to 2 units  per hour, and she was switched to Humulin 70/30, 25 units a.m. and 25 units  p.m.  She was discharged with this regimen of insulin.  She used to be  maintained on insulin 15 units NPH and 10 units regular, but it will be  easier for her to use 70/30 that is ready for her.   #2.  ABDOMINAL PAIN:  The patient presented with diffuse abdominal pain  later localized on the  lower quadrant.  Liver function tests and cardiac  enzymes were within normal limits.  She received morphine IV and later  Tylenol for abdominal pain.  She continued with right lower quadrant  abdominal pain.  This may be clinically _____ and she may need ultimately a  pelvic ultrasound to rule out any  abnormality.   #3.  CHEST PAIN:  The patient presented with chest pain, improving during  hospital stay.  Cardiac panel was drawn, and she shows increase in cardiac  enzymes, but the patient refused to have any more blood drawn.  CKP was 67  on admission 118 on October 17 at 4 p.m.  CK-MB went from 2.4 to 2.5.  Troponin I was 1.0 to 0.02.  EKG showed her to be in atrial tachycardia with  some PVCs.  At discharge, was normal sinus rhythm.   #4.  HYPERTENSION:  Under control and stable without requiring any  medication.   #5.  POLYSUBSTANCE ABUSE, ALCOHOL ABUSE:  The patient reports she has been  sober for years and that she does not need social worker consulted in  regards to any assistance, but she says she has an appointment with [facility] and  she is going to follow patient  and does not help on any matter at this  moment.   #6.  The patient was also consulted by psychiatric, Dr. Jeanie Sewer for  possible psychosis due to labile mood and crying _____. They concluded that  she was not at risk to harm herself or others.  She was _____.  She did not  require any medication at this moment.  He recommended follow up with [facility]  and AA.  Consulted Dr. Jeanie Sewer for psychiatric.   LABORATORY DATA:  Admission laboratory data:  White blood cell 15.8,  hemoglobin 13.3, hematocrit 39, platelets 361, ANC 15, ALP 1.2.  Sodium 124,  potassium 5.9, chloride 100, BUN 35, creatinine 1.7, glucose 158.  Urine  positive for ketones and glucose.  ABGs  showed pH 7.23, PCO2 22, and bicarb  9.3.   EKG showed sinus tachycardia, rightward axis.   Labs at discharge:  CBC:  White blood cells 8.9, hemoglobin 10.9, hematocrit  32.1, platelets 348.  Sodium 137, potassium 4.9, chloride 104, CO2 26, BUN  29, creatinine 1.3, glucose 88.  CBGs 210, 66 at discharge.  Discharge  weight 53.8.  Calcium 9.2   FOLLOWUP ITEMS:  ADA and AA consult.       IM/MEDQ  D:  05/20/2004  T:  05/20/2004  Job:  [id number]   cc:   [facility], [address]

## 2010-12-16 NOTE — Discharge Summary (Signed)
[place]. [facility]  Patient:    [name] [name], [name] [name]                             MRN: [id number] Adm. Date:  [id number] Disc. Date: [id number] Attending:  [name] Dictator:   Rene Paci, M.D. CC:         [name], M.D.                           Discharge Summary  DISCHARGE DIAGNOSES: 1. Status post diabetic ketoacidosis. 2. Polysubstance abuse including cocaine, marijuana, alcohol and tobacco. 3. Diabetes mellitus, insulin-dependent, poorly controlled.  DISCHARGE MEDICATIONS:  Insulin 70/30 15 units in the a.m. and 20 units in the p.m.  FOLLOWUP:  On January 11, 2000, at the [facility] at 2:30 p.m. to see Dr. Felicity Coyer.  At this visit, we will review her blood sugar log and adjust insulin as needed.  The patient was given information regarding alcohol and drug services and mental health regarding her substance abuse problem.  HISTORY OF PRESENT ILLNESS:  This is a 45 year old African-American woman who presents to [facility] complaining of nausea, vomiting and diarrhea since the night prior to admission.  She states she has been feeling poorly for the three days prior to admission vomiting once three days ago and was okay the following day, but then with nausea and vomiting persisting the day prior to admission.  She says she ate dinner yesterday evening, despite the nausea, and was able to keep the food down.  She drank a beer and enjoyed a cocaine and marijuana smoke, but then felt sick.  The diarrhea and cramping began soon thereafter and persisted all night.  The patient claims to have had subjective fevers and chills but denies hemoptysis or blood in stool.  The patient is currently on her menses.  She has had no sick contacts and has been taking her insulin regularly.  SOCIAL HISTORY:  The patient lives with her sister, 8 year old daughter and niece who is currently ill with cervical cancer.  She freely admits to  using any drugs including cocaine, marijuana and alcohol.  She also smokes one pack of tobacco a day.  FAMILY HISTORY:  Mother who died secondary to breast cancer and father who died secondary to a stroke.  PHYSICAL EXAMINATION:  VITAL SIGNS:  Temperature 98.6, blood pressure 170/84, pulse 116, respiratory rate 20, saturations 100% on room air.  GENERAL:  She is a sleepy woman after  Phenergan who appears uncomfortable in lying decubitus on a stretcher.  HEENT:  Normocephalic, atraumatic with mildly dry membranes, significant medial strabismus, otherwise unremarkable.  NECK:  Supple, nontender with no JVD, goiter or bruits.  HEART:  Regular rate and rhythm with normal S1, S2 with no murmurs appreciable.  LUNGS:  Clear bilaterally with good air movement.  No wheeze or crackles.  ABDOMEN:  Flat, soft, but diffusely tender with voluntary guarding, no rigidity.  Bowel sounds decreased.  EXTREMITIES:  Cool with no edema.  Strong pulses in the feed bilaterally.  NEUROLOGIC:  She is lethargic, but arousable.  Oriented x 4.  She moves all extremities spontaneously and has no gross deficits.  LABORATORY DATA AND X-RAY FINDINGS:  UA significant for glucose greater than 1000, ketones greater than 80, protein 30, negative for nitrites or leukocyte esterase.  Urine pregnancy test is negative.  UDS is positive for  cocaine. Alcohol level less than 10.  White count 18.9, hemoglobin 13.0, platelets 371. Sodium 136, potassium 3.8, chloride 97, bicarb 20, BUN 12, creatinine 1.0, glucose 389.  Blood acetone small.  Abdominal series is negative.  HOSPITAL COURSE:  #1 - MILD DKA:  As the patients overall symptoms were mild, it was opted not to treat the patient with an insulin drip but rather by giving additional subcutaneous insulin.  She was continued on her home dose of insulin 70/30 15 units b.i.d. with additional sliding scale coverage instead of the drip.  CBGs were checked every four  hours over the next 24 hours of this first admission and the patients gap closed within six hours without additional work.  She was rehydrated with IV fluid.  #2 - NAUSEA, VOMITING AND ABDOMINAL PAIN:  This was consistent with viral gastroenteritis.  This may have been the initiating factor of her mild DKA but it is also felt that medical noncompliance played a role in the patients manifestation.  This was treated symptomatically with IV rehydration as well as bowel rest until the patient was able to tolerate p.o. without cramping or further diarrhea.  Stool cultures were not performed and resolved by the day prior to discharge.  The patient began taking p.o. without difficulty by the third day of hospitalization and was discharged home with good appetite and no further diarrhea.  #3 - DIABETES MELLITUS TYPE 1:  Education went into helping the patient understand her diabetes.  She seems to believe that if her blood sugar is less than 200 she will feel poorly and is thus reluctant to increase her insulin dose.  After much struggle, the patient did consent to increase the a.m. dose of insulin to 20 units but maintained 15 units in the evening as her insulin schedule.  She will see me as an outpatient and we will continue to work on adjusting her insulin level for better compliance as her hemoglobin A1C this admission was 10.8.  She has no primary care physician at this time although she had previously been set up with [facility] but at this time agrees to follow up with me at the [facility].  #4 - SUBSTANCE ABUSE:  The patient freely admits that this is a problem for her and at this time has no interest in discontinuing her habit.  In fact, she says she feels better when she takes the cocaine and has been healthiest during the 16 years of her drug addiction.  Nonetheless, she is encouraged to discontinue cocaine and marijuana use and she has been provided the  numbers for alcohol and drug services as well as mental health.  The patient says she  knows she should quit but does not feel that it is a problem at this time. DD:  01/11/00 TD:  01/15/00 Job: [id number]

## 2010-12-16 NOTE — Discharge Summary (Signed)
[place]. [facility]  Patient:    [name] [name], [name] [name] Visit Number: [id number] MRN: [id number]          Service Type: MED Location: MICU 2102 01 Attending Physician:  Phifer, Trinna Post Dictated by:   [name], M.D. Admit Date:  05/28/2001 Discharge Date: 05/30/2001                             Discharge Summary  DATE OF BIRTH:  1966/05/11  CONSULTING PHYSICIANS:  None.  PROCEDURE:  None.  DISCHARGE DIAGNOSES: 1. Diabetic ketoacidosis. 2. Abdominal pain. 3. Trichomonas vaginitis. 4. Normocytic anemia with history of iron deficiency anemia. 5. Polysubstance abuse.  DISCHARGE MEDICATIONS: 1. Insulin 70/30 15 units q.a.m. and 15 units subcu q.h.s. 2. Protonix 40 mg one tablet p.o. q.d. 3. Phenergan 12.5 mg one to two tablets p.o. q.6h. p.r.n. nausea. 4. Ferrous sulfate 325 mg one tablet p.o. b.i.d.  HISTORY OF PRESENT ILLNESS:  [name] [name] is a 45 year old African-American female with a past medical history significant for diabetes mellitus type 1 with several previous hospitalizations for DKA who is presenting via EMS secondary to a one-day history of nausea, vomiting, and weakness.  The patient also complains of abdominal pain on presentation.  On presentation, the patients blood glucose was 777.  Sodium was 136, potassium 5.1, chloride 99, and CO2 16, with an anion gap of 21.  Lipase upon admission was 72, the patient had elevated liver enzymes including an AST 46, ALT 16, and alkaline phosphatase of 1 and total bilirubin of 2.5.  Urine drug screen on admission was negative. Serum acetone was moderate.  Cardiac enzymes were negative upon admission. Urinalysis was positive for glucose and ketones and negative indications of infection.  Chest x-ray on admission was within normal limits.  EKG revealed tachycardia regular rhythm with no Q waves or ST changes of concern.  The patient was admitted to the medicine teaching service and initiated on  an insulin drip at 3 units per hour.  The patient was also initiated on aggressive IV hydration with 3 liters of normal saline boluses followed by D5-1/2 normal saline. The patient was also supplemented with potassium during her IV hydration.  On hospital day #1, the patients anion gap closed from 21 to 5 within the first 24 hours of admission.  The patient was discharged on hospital day #2 with a normal anion gap.  DISCHARGE PHYSICAL EXAMINATION:  GENERAL: Well-developed, thin female in no acute distress.  Alert and oriented x 4.  HEENT: Normocephalic, atraumatic. Pupils are equal, round and reactive to light.  Sclerae nonicteric.  Mucous membranes are moist.  NECK: Supple without lymphadenopathy.  HEART: Tachycardic, regular rhythm, 2/6 systolic ejection murmur.  LUNGS: Clear to auscultation bilaterally.  No wheezing and no crackles.  ABDOMEN: Soft, nontender, and nondistended.  Positive bowel sounds, normoactive. EXTREMITIES: No clubbing, cyanosis, or edema.  DISCHARGE PROBLEM LIST:  #1 - DKA.  On presentation, the patient received 10 units of Regular Insulin followed by an insulin drip at 3 units per hour.  The patient also received aggressive IV hydration with a total of four boluses followed by IV fluids 200 cc per hour.  Initially, BMETs were checked every two hours followed by every four hours and then they were decreased to every six hours.  CBGs were checked every two hours or so at admission.  On hospital day #1, the patient did loose IV access without ability  by the IV team to retain IV access.  At that time of IV access loss, the patients anion gap was within normal limits.  The patient was then hydrated orally and CBGs were checked every two hours.  Her anion gap remained within normal limits. Etiology of DKA most likely secondary to alcohol intake and drug use over the weekend prior to presentation.  #2 - Abdominal pain.  The patient presented with acute abdominal pain  on presentation.  The patients initial pain was treated with Vicodin p.r.n. for pain.  The patient had an elevated lipse of 81 on admission and elevated LFTs. Three-way abdominal films on admission were within normal limits with no perforation or free air noted.  Ultrasound of the abdomen was within normal limits without evidence of gallstones or abnormalities of the pancreas.  The patient tolerated p.o. without further nausea and vomiting on hospital day #1.  #3 - Trichomonas vaginitis.  Secondary to abdominal pain on presentation, the patient received an examination and wet prep was positive for Trichomonas. The patient was treated with Metrojel during her admission.  #4 - Anemia.  The patient has a significant history of peptic ulcer disease, gastritis, and GERD.  The patient was continued on Protonix during her admission.  Her hemoglobin remained within normal limits throughout her admission with a discharge hemoglobin of 9.8.  The patient was discharged on iron therapy secondary to her history of iron deficiency.  #5 - History of GERD, peptic ulcer disease, and gastritis.  The patient was continued on Protonix throughout her hospitalization.  Last EGD was performed on April of 2002 which revealed 17 cm of moderate esophagitis.  The patient was discharged on Protonix.  This will be followed up as an outpatient.  #6 - Polysubstance abuse.  The patient reports recent alcohol and cocaine ingestion prior to presentation.  The patient was spoken to by the medicine teaching service team regarding her polysubstance abuse.  The patient denied social work consult and says she has spoken with them during previous admissions.  #7 - History of noncompliance.  The patient does have a significant history of noncompliance for her insulin medication.  During this admission she denies noncompliance of her medications, however, an issue to keep in mind for further admissions.  DISCHARGE  LABORATORY DATA:  Sodium 133, potassium 4.4, chloride 104, bicarb  24, BUN 9, creatinine 0.8, anion gap 5, ethyl alcohol less than 5, cholesterol 221, triglycerides 228, HDL 60, LDL 115.  Abdominal ultrasound was within normal limits.  Magnesium level 1.7, lipase 83.  Salicylate level less than 4, acetominophen 4.2, urine HCG negative, serum osmolality 359, lactic acid 1.8, wet prep positive for Trichomonas.  PT 11.7, INR 0.8, PTT 29, urine drug screen negative, serum acetone moderate.  Urinalysis; straw, clear, 1.034, 5.0, glucose greater than 1000, hemoglobin trace.  Bilirubin negative, ketones greater than 80, protein negative, nitrite negative, leukocyte esterase negative.  (This is admission urinalysis).  TCA negative.  Alcohol less than 5.  CK 41, MB 0.7, troponin 0.02.  Acute abdominal series with chest films; mild increase in large bowel gas.  No evidence of obstruction or perforation. Some atelectasis, left lung base, otherwise normal chest x-ray.  ACTIVITY:  No restrictions.  DIET:  Recommend low sugar, diabetic diet.  WOUND CARE:  Not applicable.  DISCHARGE INSTRUCTIONS:  Recommend the patient call her doctor for the development of nausea and vomiting.  FOLLOW-UP:  The patient has an appointment with her physician at [facility] on November  6, at 8:15. Dictated by:   [name], M.D. Attending Physician:  Phifer, Trinna Post DD:  05/30/01 TD:  05/31/01 Job: [id number]

## 2010-12-16 NOTE — Discharge Summary (Signed)
NAME:  [name] [name], [name] [name]                    ACCOUNT NO.:  [id number]   MEDICAL RECORD NO.:  [id number]            PATIENT TYPE:   LOCATION:                                 FACILITY:   PHYSICIAN:  Deirdre Peer. Polite, M.D. DATE OF BIRTH:  1966-03-16   DATE OF ADMISSION:  DATE OF DISCHARGE:                               DISCHARGE SUMMARY   DISCHARGE DIAGNOSES:  1. Type 1 diabetes.  2. Gastroparesis.  3. Anxiety disorder.  4. Leukocytosis.  5. Hypokalemia.  6. A history of polysubstance abuse.  7. A history of medication noncompliance, a poor understanding of      diabetes.   DISCHARGE MEDICINES:  1. Lantus 20 units daily.  2. Zoloft 100 mg daily.  3. Reglan 5 mg q.i.d.   DISPOSITION:  Discharged home in stable condition and asked to follow up  with [facility] in approximately 1 week.   HISTORY OF PRESENT ILLNESS:  A 45 year old female with a known history  of type 1 diabetes and anxiety presented to the hospital with the  complaints of nausea and vomiting.  In the ED, patient was evaluated and  admission was deemed necessary for poor-controlled diabetes, nausea and  vomiting, possible bowel gastroenteritis versus diabetic gastroparesis.  Please see dictated H&P for further details.   HOSPITAL COURSE:  The patient was admitted to a medicine floor//bed for  further evaluation.   TREATMENT:  The patient was given symptomatic treatment of antiemetics,  analgesia, IV fluids.  The patient's symptoms were slow to improve.  Patient had a lot of anxiety and complaints of pain while in the  hospital.  Patient had labs that showed a white count of 22, hemoglobin  11.5.  CT showed uterine fibroid and thickening of the distal esophagus.  Patient had a urine drug screen that was negative, UA negative.  Chest x-  ray showed a right base density, atelectasis versus infiltrate.  Patient  had a follow-up CBC which showed white count down to 15.6.  The patient  was without fever or chills.  The patient  did have an ultrasound of her  gallbladder that showed sludge, no stone, no evidence of cholecystitis.  While here, it appeared that the patient had very poor understanding of  her  diabetes.  Her symptoms of anxiety improved with restarting her  antidepressant.  The patient remained febrile.  Her BMET at discharge  was within normal limits.  Patient was given diabetic education and  strongly encouraged to follow up at [facility].      Deirdre Peer. Polite, M.D.  Electronically Signed     RDP/MEDQ  D:  08/14/2006  T:  08/15/2006  Job:  [id number]

## 2010-12-16 NOTE — Discharge Summary (Signed)
[place]. [facility]  Patient:    [name], [name] Visit Number: [id number] MRN: [id number]          Service Type: MED Location: [id number] Attending Physician:  Doneta Public Dictated by:   Emelda Fear, M.D. Admit Date:  06/24/2001 Discharge Date: 06/27/2001                             Discharge Summary  PROCEDURE:  Placement of PICC line.  DISCHARGE DIAGNOSES:  1. Diabetic ketoacidosis.  2. Nausea and vomiting secondary to #1.  3. Abdominal pain secondary to diabetic ketoacidosis.  4. Gastritis.  5. Peptic ulcer disease.  6. Gastroesophageal reflux disease.  7. History of pancreatitis.  8. Diabetic gastroparesis.  9. Polysubstance abuse, including alcohol, cocaine, and marijuana in the     past. 10. History of cerebrovascular accident in 2000 with return of function. 11. Noncompliance.  DISCHARGE MEDICATIONS: 1. Protonix 40 mg one tablet p.o. q.d. 2. Insulin 70/30 15 units subcutaneously q.a.m. and 18 units subcutaneously    q.p.m. 3. Phenergan 12.5 mg one tablet p.o. q.6h. p.r.n. nausea.  HOSPITAL COURSE:  [name] is a 45 year old African-American female with diabetes mellitus type 1 with numerous previous admissions for DKA, presenting secondary to a one week history of nausea and vomiting.  CBG on presentation was measured in the 500s per EMS.  Anion gap upon presentation was 23.  Urine drug screen was positive for cocaine.  The patient was admitted secondary to diabetic ketoacidosis and dehydration.  The patient received insulin drip, aggressive IV hydration, and Phenergan as needed for nausea.  During hospitalization, the patients anion gap normalized, and the patient became euvolemic with aggressive IV hydration.  Secondary to positive urine drug screen, social work visited with the patient.  The patient declined need for rehab or outpatient assistance.  As in past hospitalizations, the patient received extensive education  regarding importance of diabetic diet and regular meals.  Also stressed to the patient the importance of following up with [facility].  The patient has a history of noncompliance and lack of followup.  The patient was discharged on hospital day #5, in stable condition with a normal anion gap.  PROBLEM LIST: 1. Diabetic ketoacidosis.  The patient received insulin drip, as well as    aggressive IV hydration during admission.  The patient was gradually weaned    off of insulin drip and initiated on home insulin regimen.  On date of    discharge, the patient was tolerating p.o. without difficulty, and CBGs    were normalizing in the 100s to 150s.  The patient received extensive    education regarding importance of regular diet and compliance of insulin    regimen. 2. Nausea and vomiting.  Amylase and lipase within normal limits on admission.    Resolved with resolution of diabetic ketoacidosis.  The patient frequently    presents with nausea and vomiting with elevated CBGs.  The patient was    tolerating p.o. without difficulty on day of discharge. 3. Abdominal pain, resolved on day of discharge.  Extensive workup in the    past, including normal ultrasound of gallbladder and normal abdominal    series on last admission.  Normal CT of abdomen in April 2002.  Most likely    associated to DKA. 4. History of gastritis.  The patient was continued on Protonix throughout    admission.  Esophagogastroduodenoscopy in 2002,  revealed moderate    gastritis. 5. Peptic ulcer disease.  The patient was continued on Protonix throughout    admission. 6. Gastroesophageal reflux disease.  No issues during admission.  The patient    was continued on Protonix. 7. Polysubstance abuse.  The patient was positive for cocaine in her urine on    presentation.  Contributing to noncompliance and frequent diabetic    ketoacidosis episodes.  Social work visited with the patient once again    during this admission.   The patient declined any assistance with substance    abuse.  DISCHARGE LABORATORY DATA:  Sodium 131, potassium 3.7, chloride 97, CO2 27, BUN 8, creatinine 0.8, calcium 8.7.  White blood cell count 8.4, hemoglobin 10.0, hematocrit 28.9, platelets 221.  Blood cultures negative to date.  Blood acetone negative.  Amylase 84, lipase 46.  CK 36, MB 1.1, troponin-I 0.01.  ACTIVITY:  No restrictions.  DIET:  Recommended a low sugar diabetic diet.  WOUND CARE:  Not applicable.  DISCHARGE INSTRUCTIONS:  The patient is to call [facility] for onset of nausea or vomiting, or blood sugars above 300.  FOLLOWUP:  It was stressed to the patient the importance of keeping [facility] appointment in the upcoming week. Dictated by:   Emelda Fear, M.D. Attending Physician:  Doneta Public DD:  09/17/01 TD:  09/18/01 Job: [id number]

## 2010-12-16 NOTE — Consult Note (Signed)
[place]. [facility]  Patient:    [name] [name], [name] [name]                             MRN: [id number] Proc. Date: 11/03/00 Adm. Date:  [id number] Attending:  Edwyna Perfect                          Consultation Report  HISTORY OF PRESENT ILLNESS:  Subjectively, this 45 year old black female was referred for an evaluation because of melanotic stool that was present.  She was initially admitted into the hospital with nausea and vomiting associated with diarrhea over one days period of time.  She has had sharp epigastric pain and discomfort, which was radiating in the abdominal region at this time. She denies admission and any previous hematemesis or melanotic stools that were present up until the day of the consultation was requested.  She has had difficulties with eating at this time with persistent emesis, which the staff doctors at the hospital have been working with diligently to try to get better control.  The patient has a history of diabetes mellitus, for which she presently is taking insulin.  She also has a history of substance abuse that was present. She states that she has been in the hospital previously with pains and discomforts, but they usually resolve.  This is the first time she states that the pains and discomforts have worsened.  On November 03, 1998 a consultation was obtained because the patient has passed melanotic stools.  The stools were tested and were Heme-positive.  This appeared to show evidence of a second episode of melena that was present, and thus the consultation was obtained to evaluate the patient for possible GI bleeding.  The patient admits to having a history of peptic ulcer disease in the past, but she had only been on treatment for a short period of time.  The substances that she had used in the past were cocaine, as well as marijuana that was noted.  She denies any history of any aspirin use or abuse from that standpoint, except for  the medications mentioned above.  PAST MEDICAL HISTORY:  Past medical history has only been for the diabetes that was present at this time.  CURRENT MEDICATIONS:  Current medications the patient has mentioned taking were insulin, Tylenol, and sinus medications.  REVIEW OF SYSTEMS:  Has been associated with diabetes, for which she is presently taking insulin, and substance abuse.  PHYSICAL EXAMINATION:  GENERAL:  She is resting comfortably in bed with some abdominal discomfort noted.  VITAL SIGNS:  Temperature was approximately 99.8, her blood pressure ranged around 145/80.  Pulse rate was approximately 100-110.  Respiratory rate was about 18.  HEENT:  Shows evidence of muddy sclerae.  NECK:  Supple.  LUNGS:  Clear to auscultation.  HEART:  Heart had a regular rate and rhythm, slightly tachycardic without heaves, thrills, murmurs, or gallops noted.  ABDOMEN:  Tender, positive _________ noted in the supraumbilical area.  No distention appreciated.  No asymmetry was noted at this time.  I did not appreciate any rebound or referred tenderness noted at this time.  EXTREMITIES:  Showed no cyanosis, clubbing, or edema.  GROSS IMPRESSION: 1. Melanotic stools (Heme-positive), rule out secondary to Mallory-Weiss tear    (increased vomiting that has been ongoing for several days), rule out    possibly related to peptic ulcer disease that is  present.  Rule out other    causes as noted. 2. Persistent abdominal pains, possibly related to pancreatitis, rule out    other causes including _________ disease that is present at this time.  PRESENT RECOMMENDATIONS: 1. _________ the patient has endoscopic examination to be done in the a.m.    to determine the source of bleeding at this time. 2. Agree with starting the patient with PTI or an H2RA intravenously at this    time.  Because of her persistent vomiting I would use mostly parenteral    medications on the patient at this time. 3. I  checked in the chart and did not appreciate a serum amylase level and    would recommend an amylase level (even though her lipase level is normal)    to determine if evidence of pancreatitis does exist on the patient and    depending upon these results, will determine the course of therapy.  Thank you much for the consultation.  I will follow the patient with you. DD:  11/03/00 TD:  11/03/00 Job: [id number]

## 2010-12-16 NOTE — Discharge Summary (Signed)
[place]. [facility]  Patient:    [name] [name], [name] [name]                             MRN: [id number] Adm. Date:  [id number] Disc. Date: [id number] Attending:  Anise Salvo Dictator:   Blanch Media, M.D.                           Discharge Summary  CONTINUITY DOCTOR:  [facility].  PROCEDURES: 1. Right subclavian central line placed on August 05, 2000. 2. Transvaginal ultrasound and pelvic ultrasound showing multiple uterine    fibroids. 3. CT of the pelvis and abdomen on January 6, showing no evidence of acute    pyelonephritis.  DISCHARGE MEDICATIONS: 1. Insulin 70/30 20 units q.a.m. and 20 units q.p.m. 2. Ferrous sulfate 325 mg one p.o. q.d. 3. Avelox 400 mg one p.o. q.d. x 9 days.  DISCHARGE DIAGNOSES: 1. Urinary tract infection diagnosed December 30 with pansensitive E. coli, no    evidence of pyelonephritis on abdominal CT. 2. Dehydration secondary to nausea and vomiting. 3. 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  4. Trichomonas infection. 5. Substance abuse. 6. Uterine fibroids diagnosed on pelvic ultrasound and transvaginal ultrasound    on January 7. 7. Pneumonia, left basilar, diagnosed on CT on January 6. 8. Normocytic anemia of iron deficiency.  BRIEF HISTORY OF PRESENT ILLNESS:  The patient is a 45 year old female, a patient of [facility], who presented to the ER on December 30 with dysuria. The patient was diagnosed with a UTI.  Urine culture was obtained which greater than 100,000 colonies of E. coli pansensitive.  The patient was sent home on Cipro.  The patient states the Cipro made her sick with nausea and vomiting, and she was unable to take any pills.  Since December 30, the patient became increasingly ill with subjective fevers, chills, nausea, vomiting, and increased flank pain which is worse on the right side.  She also complains of a vaginal discharge of a foamy consistency with no  smell.  The patient states that she has not been taking her insulin secondary to not having her diabetic testing strips.  SOCIAL HISTORY:  The patient is not married, but has a boyfriend, and they are in a monogamous relationship.  She smokes one-half pack per day.  Previously smoked one pack per day.  She does use alcohol about a six-pack per week, although previously she drank a six-pack per day.  She uses marijuana and crack, and her last usage of crack was the Saturday before admission.  PHYSICAL EXAMINATION ON ADMISSION:  VITAL SIGNS:  Physical examination was pertinent for a temperature of 98.5.  ABDOMEN:  Flank tenderness, right greater than left, with tenderness in the right upper quadrant region.  No guarding, no rebound.  GENITOURINARY:  Gynecological exam performed in the ER by the nurse practitioner showed cervical motion tenderness with adnexal tenderness bilaterally, and the patient was menstruating.  LABORATORY AND X-RAY DATA:  White blood cell count 12.8, hemoglobin 14.4, hematocrit 44.0, platelets 359, ANC 10.1.  Sodium 133, potassium 3.3, chloride 93.  Comprehensive:  Normal.  A UA showed pH of 5.0, glucose greater than 1000, moderate bili, ketones greater than 80, total protein 100, negative nitrite, negative leukocyte esterase.  Microscopic showed many epithelial cells, 6-10 white blood cells, 0-5 red blood cells, and granular casts.  A wet prep  showed Trichomonas, no clue cells, many white blood cells.  Urine pregnancy test was negative.  ASSESSMENT AND PLAN IN THE EMERGENCY ROOM:  The patient was diagnosed with a urinary tract infection which grew pansensitive E. coli.  She was unable to complete antibiotic treatment secondary to nausea and vomiting.  She now presented with signs and symptoms consistent with pyelonephritis.  We felt that the causative organism was most likely the same E. coli.  We did not repeat a second urine culture.  The patient also  complained of a vaginal discharge and was diagnosed with Trichomonas.  She had received 1 g of Rocephin during her first ER visit which would cover her for gonorrhea.  A GC probe was performed which was negative for gonorrhea and Chlamydia.  HOSPITAL COURSE: #1 - PRESUMPTIVE ACUTE PYELONEPHRITIS:  The patient was to be started on Tequin 400 mg IV q.d., although we later found out that this was not started. The patient was also hydrated with IV fluids and provided with Phenergan p.r.n.  It was not discovered that the patient was not receiving her antibiotics until January 6.  The patients flank pain, nausea, and vomiting became worse, and the patient was transferred to the ICU.  At this time her white blood cell counts were increasing to 19.5, her bicarb had decreased to 10, and we were worried about sepsis and/or perinephritic abscess. Antibiotics were changed to Zosyn, and an abdominal and pelvic CT was obtained to rule out abscess.  The patient required central IV access on January 6. the patients abdominal and pelvic CT did not show any evidence of pyelonephritis.  The patient quickly responded to IV antibiotics and was transferred out of the ICU on January 7.  While in the ICU the patient had been receiving imipenem, Primaxin.  Once the patient was stabilized, antibiotics were simplified to Tequin p.o.  The patient had no more flank pain, nausea, vomiting, and dysuria and was ready for discharge on oral antibiotics.  #2 - DIABETES:  The patient states that she was insulin requiring after her last delivery 15 years ago.  She recently has not been taking her insulin because of running out of diabetic testing strips.  We maintained her on a sliding scale on her admission since she was n.p.o.  As before on January 6, the patient developed complications.  It was felt that she was sliding into diabetic ketoacidosis.  The patient had an anion gap of 21 with small serum acetone level.  The  patient was maintained on an insulin drop and D5 drip in the ICU, and she quickly closed her gap and resolved her acidosis.  Her  insulin drip was discontinued on January 8, and she was put on a reduced dosage of 70/30 on January 8.  She returned to her home dose of insulin on January 9.  CBGs prior to discharge ranged from 94 to 214.  The patient states that at her last doctors visit she was increased from 20 units b.i.d. to 24 units b.i.d.  I instructed her to take 20 units b.i.d. when she goes home and to check her sugars and write down her CBGs.  I told her that she could take her levels to her next doctors appointment, and that they can make further adjustments to her insulin regimen at that time.  She states that she works nights, and that she has had a difficult time managing her insulin around her work schedule.  She states that she eats her  big meal at 5:30 in the evening before going to work.  She has a snack break at 8 and midnight.  Taking her 20 units of insulin at 5 p.m. will cover her for the night.  She states that she typically takes her morning insulin around 7 a.m. along with a breakfast.  I told her that it would most likely work out taking her insulin at 7 a.m. and at 5 oclock at night.  She will need close follow-up of this.  #3 - PNEUMONIA:  The patient had no signs or symptoms of pneumonia on examination.  This was an incidental finding on CT of the abdomen obtained on January 6.  As before, antibiotics were simplified to Tequin to cover both her UTI and her pneumonia.  We plan to complete a total of 14 days of antibiotic therapy.  The patient received five days of antibiotics prior to her discharge from the hospital.  #4 - TRICHOMONAS INFECTION:  We had ordered Flagyl on her admission, although as before, she did not receive this antibiotic.  I will give her a 2-gram dose of Flagyl prior to her discharge.  I am providing her a prescription for a second dose of  Flagyl to give her to partner to treat him.  #5 - UTERINE FIBROIDS DIAGNOSED ON PELVIC ULTRASOUND:  The patient states that her period started on January 1, and she was continuing to have spotting on January 10.  She states that she has noticed this for some time now.  She also states that she has been having unprotected sex for the past 15 years and has not been pregnant.  We discussed what uterine fibroids were and the treatment if indicated.  I told her to talk with her gynecologist about whether or not she will need treatment.  #6 - NORMOCYTIC ANEMIA OF IRON DEFICIENCY:  The patients hemoglobin had decreased to a level of 9.5.  Iron studies were ordered which showed a ferritin level of 90, an iron of 18, a total iron-binding capacity of 185, and a percent saturation of 10.  The patient was started on ferrous sulfate 325 mg t.i.d.  The patient was having no complications on this therapy prior to discharge.  #7 - POLYSUBSTANCE ABUSE:  The patient states that she has recently significantly decreased her use of tobacco and alcohol.  The case manager talked with her prior to discharge and states that she goes to counseling at mental health for this problem.  She was interested in [place] and was given their telephone number.  DISPOSITION:  The patient is to be discharged to home.  She has a follow-up appointment at [facility] on January 24 at 8:30 a.m.  She receives her medications from [facility].  They have ferrous sulfate in stock; however, they do not have Tequin in stock, but do have Avelox which is another fourth generation fluoroquinolone.  She has a supply of insulin 70/30 at home and has diabetic testing strips at home. DD:  08/09/00 TD:  08/10/00 Job: [id number]

## 2010-12-16 NOTE — Discharge Summary (Signed)
[place]. [facility]  Patient:    [name] [name], [name] [name]                             MRN: [id number] Adm. Date:  [id number] Disc. Date: 11/08/00 Attending:  Edwyna Perfect Dictator:   Ebbie Ridge, M.D. CC:         Sabino Gasser, M.D., [facility]  [facility], Attention "[place]"  Sharyn Dross., M.D.   Discharge Summary  PROCEDURES:  1. CT of the pelvis and abdomen revealed marked mural thickening of     the distal esophagus and gastroesophageal junction region consistent with     tumor or esophagitis.  Hiatal hernia may be present.  Scarring of the left     kidney with findings consistent with chronic atrophic pyelonephritis     (reflux neuropathy).  No hydronephrosis.  Fibroid uterus with cystic     degeneration.  No free pelvic fluid.  2. Ultrasound of the abdomen, impression normal.  3. Endoscopy, impression esophageal inflammation of moderate     severity, erosions present.  Length of inflammation 17 cm.     Grade 2-3 esophagitis noted.  This is established as a result     of reflux.  CONSULTATIONS:  Gastroenterology, Dorise Hiss, M.D.  DISCHARGE DIAGNOSES:  1. Severe esophagitis.  2. Alcoholic gastritis.  3. Gastroesophageal reflux disease.  4. Type 1 diabetes mellitus.  5. Polysubstance abuse.  6. Hyperlipidemia.  7. Chronic pyelonephritis.  8. History of recurrent urinary tract infection.  9. Normocytic anemia. 10. History of left lower lobe pneumonia. 11. Diabetic ketoacidosis, resolved. 12. Hypokalemia. 13. Trichomonas.  DISCHARGE MEDICATIONS: 1. Carafate 1-2 tsp q.a.c. and q.h.s. 2. Insulin 70/30; the patient is to resume home regimen. 3. Protonix 40 mg one p.o. b.i.d.  SPECIAL INSTRUCTIONS:  The patient was told to avoid caffeine, tobacco, alcohol, onions, chocolate and fatty foods.  HISTORY OF PRESENT ILLNESS:  [name] is a 45 year old black female with a history of polysubstance abuse and type 1 diabetes  mellitus who presented to the emergency room with nausea, vomiting and diarrhea x 1 day.  Her abdominal pain was epigastric, sharp and stabbing, and associated with vomiting and diarrhea.  The patient reported these symptoms began after a two day alcohol, cocaine and marijuana binge triggered by her 47 year old daughter leaving on Sunday to return to her fathers home.  She was found late Monday night/early Tuesday morning by her sister vomiting and unable to keep p.o. down and was sent to the emergency room.   On exam, she was found to be hypersomnolent secondary to three consecutive doses of Phenergan she had received in an attempt to control her vomiting.  LABORATORY DATA:  On admission, white count was 13.2, where there were 89% neutrophils and 8% lymphocytes.  Hemoglobin 13.4 and hematocrit 39.7, platelet count 388,000.  Sodium was 137, potassium 3.2, chloride 94, bicarbonate 26, glucose 270, BUN 9, creatinine was 1.0.  Total bilirubin 1.5, alkaline phosphatase 102, AST 40, ALT 13, total protein 8.5, albumin 4.2. calcium 10.1. Blood alcohol level was less than 10.  Urine pregnancy test was negative. Blood lipase was normal at 17.  Urinalysis was positive for 500 glucose, greater than 80 ketones, 100 protein, and Trichomonas.  Blood cultures were negative.  Stool culture was negative for Yersinia or Escherichia coli, Salmonella, Shigella, or Campylobacter.  HOSPITAL COURSE: #1 - SEVERE ESOPHAGITIS:  The patient had intractable nausea,  vomiting and epigastric pain whenever she attempted to take food or medicine orally.  After she developed heme-positive stools and coffee-ground emesis, a GI consult was obtained.  Endoscopy was done with the findings as above.  The patient was started on Carafate slurry before meals and at night.  Protonix was increased to twice daily dosing given the presence of blood in the stool and emesis. [place]le was progressed very slowly to a normal diet.  She  continued to have nausea, vomiting and epigastric pain after meals.  This gradually improved to the point that she could tolerate meals without an antiemetic.  She understands that she will need to continue the Carafate and the proton pump inhibitor for at least three weeks.  If she is unable to pay for these prescriptions, she will call [facility] early in the morning to determine the best way to have these medications filled by the county pharmacy.  She knows that she is to avoid alcohol, smoking, cocaine, marijuana and caffeine.  #2 - TYPE 1 DIABETES MELLITUS:  The patient was very dehydrated on admission with an elevated glucose but did not have an acidosis.  However, after 24 hours of vomiting and receiving IV fluids, Insulin NPH and a sliding scale, she did develop a ketoacidosis.  An insulin drip was initiated.  Her ketoacidosis resolved.  She continued to run intermittently Purk sugars with only one episode of hypoglycemia.  At discharge, she is to resume her home regimen as she will be returning to her home diet.  #3 - HYPOKALEMIA:  This is likely secondary to the patients persistent vomiting.  She required oral supplementation on multiple occasions.  #4 - POLYSUBSTANCE ABUSE:  The patient reported that she wanted to quit drinking.  She refused rehab service, and any attempts made by case management stating that she could easily quit using alcohol and cocaine on her own. She did report that she would not be able to start using marijuana or smoking, and she had no intention of trying.  She reported that she is able to support her drug habit by buying more than she intends to use and selling the extra to pay for her portion.  #5 - DEPRESSION:  The patient reports years of depression, which has been worse since the death of her mother over 10 years ago.  She uses alcohol and drugs to medicate herself.  She was seeing Annia Friendly at [facility] but stopped in  January.  She agreed to recontact him and redo therapy after discharge.  #6 - TRICHOMONAS:  Treatment for this was not given due to its side effect of nausea and vomiting.  Once the patient was tolerating p.o.s we did not want to do anything to restimulate her nausea and vomiting.  Her infection was asymptomatic during her admission.  However, she should be treated with Flagyl after discharge.  Her alcoholism does present a complication to treatment, and the patient should be strongly advised not to drink during treatment.  #7 - ANEMIA:  Although the patient had a normal hemoglobin on admission, after hydration her hemoglobin decreased to 10.8 with an MCV of 91.7.  This may be secondary to her alcoholism or could be due to a slow GI blood loss due to her gastritis.  She has been on iron in the past.  She did not receive iron during this hospitalization because of GI side effects.  Once her esophagitis resolves, iron therapy should be reinitiated.  DISCHARGE LABORATORY DATA:  White blood count 10.2, hemoglobin 10.8, hematocrit 31.8, platelet count 352,000.  Sodium 130, potassium 4.1, chloride 95, bicarbonate 29, glucose 353, BUN 8, creatinine 0.9, calcium 8.6.  FOLLOW-UP:  The patient was discharge after business hours so it was impossible to schedule an appointment with [facility].  The patient is to call [facility] in the morning to schedule a follow-up appointment as soon as possible.  She will call [facility] to schedule an appointment in [place] in one to two weeks. DD:  11/08/00 TD:  11/09/00 Job: [id number]

## 2010-12-16 NOTE — Op Note (Signed)
NAME:  [name] [name], [name] [name]                    ACCOUNT NO.:  [id number]   MEDICAL RECORD NO.:  [id number]          PATIENT TYPE:  AMB   LOCATION:  ENDO                         FACILITY:  [facility]   PHYSICIAN:  John C. Madilyn Fireman, M.D.    DATE OF BIRTH:  17-Oct-1965   DATE OF PROCEDURE:  08/30/2006  DATE OF DISCHARGE:                               OPERATIVE REPORT   PROCEDURE:  Esophagogastroduodenoscopy with esophageal dilatation.   INDICATIONS FOR PROCEDURE:  Dysphagia with known stricture last dilated  July 30, 2006 at 12.8 cm.   PROCEDURE:  The patient was placed in the left lateral decubitus  position and placed on pulse monitor with continuous low-flow oxygen  delivered by nasal cannula.  No sedation was used due to inability to  get a peripheral IV.  The Olympus video endoscope was advanced under  direct vision into the oropharynx and esophagus was straight,  of normal  caliber and I did not really appreciate any significant strictures to my  surprise.  The GE junction was somewhat irregular at 38 cm, but there is  no resistance to passage of the scope through it and no friability  encountered with passing the scope.  The stomach was entered and a small  amount of liquid secretions were suctioned from the fundus.  Retroflexed  view of cardia was unremarkable.  The fundus, body, antrum and pylorus  all appeared normal.  The duodenum was entered and both bulb and second  portion were inspected and appeared be within normal limits.  The Savary  guidewire was placed through the endoscope channel and scope withdrawn.  A single 16-mm Savary guidewire was passed over the guidewire under  fluoroscopic visualization with minimal resistance.  The dilator was  removed together with the wire and the patient returned to the recovery  room in stable condition.  There was no blood seen on withdrawal of the  dilator.  The patient tolerated the procedure well.  There were no  immediate complications.   IMPRESSION:  Marked improvement of previous inflamed esophageal  stricture.   PLAN:  Continue proton pump inhibitor if she can afford it as  prophylaxis against further stricture or esophagitis. Otherwise follow  up p.r.n. for dysphagia.  carbon copy of [place] right talus or  history.           ______________________________  Everardo All Madilyn Fireman, M.D.     JCH/MEDQ  D:  08/30/2006  T:  08/30/2006  Job:  [id number]   cc:   Fanny Dance. Rankins, M.D.

## 2011-01-23 ENCOUNTER — Emergency Department (HOSPITAL_COMMUNITY): Payer: Medicaid Other

## 2011-01-23 ENCOUNTER — Emergency Department (HOSPITAL_COMMUNITY)
Admission: EM | Admit: 2011-01-23 | Discharge: 2011-01-23 | Disposition: A | Discharge status: Home / Self Care | Payer: Medicaid Other | Attending: Emergency Medicine | Admitting: Emergency Medicine

## 2011-01-23 DIAGNOSIS — M129 Arthropathy, unspecified: Secondary | ICD-10-CM | POA: Insufficient documentation

## 2011-01-23 DIAGNOSIS — I2510 Atherosclerotic heart disease of native coronary artery without angina pectoris: Secondary | ICD-10-CM | POA: Insufficient documentation

## 2011-01-23 DIAGNOSIS — F341 Dysthymic disorder: Secondary | ICD-10-CM | POA: Insufficient documentation

## 2011-01-23 DIAGNOSIS — Z79899 Other long term (current) drug therapy: Secondary | ICD-10-CM | POA: Insufficient documentation

## 2011-01-23 DIAGNOSIS — Z794 Long term (current) use of insulin: Secondary | ICD-10-CM | POA: Insufficient documentation

## 2011-01-23 DIAGNOSIS — Z9861 Coronary angioplasty status: Secondary | ICD-10-CM | POA: Insufficient documentation

## 2011-01-23 DIAGNOSIS — E119 Type 2 diabetes mellitus without complications: Secondary | ICD-10-CM | POA: Insufficient documentation

## 2011-01-23 DIAGNOSIS — I252 Old myocardial infarction: Secondary | ICD-10-CM | POA: Insufficient documentation

## 2011-01-23 DIAGNOSIS — R079 Chest pain, unspecified: Secondary | ICD-10-CM | POA: Insufficient documentation

## 2011-01-23 LAB — DIFFERENTIAL
Eosinophils Absolute: 0.2 10*3/uL (ref 0.0–0.7)
Lymphs Abs: 1.7 10*3/uL (ref 0.7–4.0)
Monocytes Relative: 4 % (ref 3–12)
Neutro Abs: 10.9 10*3/uL — ABNORMAL HIGH (ref 1.7–7.7)
Neutrophils Relative %: 81 % — ABNORMAL HIGH (ref 43–77)

## 2011-01-23 LAB — CBC
Hemoglobin: 10.7 g/dL — ABNORMAL LOW (ref 12.0–15.0)
MCH: 29.5 pg (ref 26.0–34.0)
MCV: 89.3 fL (ref 78.0–100.0)
RBC: 3.63 MIL/uL — ABNORMAL LOW (ref 3.87–5.11)

## 2011-01-23 LAB — COMPREHENSIVE METABOLIC PANEL
ALT: <5 U/L (ref 0–35)
BUN: 20 mg/dL (ref 6–23)
CO2: 22 mEq/L (ref 19–32)
Calcium: 9.6 mg/dL (ref 8.4–10.5)
Creatinine, Ser: 0.78 mg/dL (ref 0.50–1.10)
GFR calc Af Amer: >60 mL/min (ref >60)
GFR calc non Af Amer: >60 mL/min (ref >60)
Glucose, Bld: 86 mg/dL (ref 70–99)

## 2011-01-23 LAB — TROPONIN I: Troponin I: <0.3 ng/mL (ref <0.30)

## 2011-02-08 ENCOUNTER — Inpatient Hospital Stay (HOSPITAL_COMMUNITY)
Admission: EM | Admit: 2011-02-08 | Discharge: 2011-02-17 | DRG: 391 | Disposition: A | Discharge status: Home Health | Payer: Medicaid Other | Attending: Internal Medicine | Admitting: Internal Medicine

## 2011-02-08 DIAGNOSIS — I10 Essential (primary) hypertension: Secondary | ICD-10-CM | POA: Diagnosis present

## 2011-02-08 DIAGNOSIS — K228 Other specified diseases of esophagus: Secondary | ICD-10-CM | POA: Diagnosis present

## 2011-02-08 DIAGNOSIS — D72829 Elevated white blood cell count, unspecified: Secondary | ICD-10-CM | POA: Diagnosis present

## 2011-02-08 DIAGNOSIS — F17200 Nicotine dependence, unspecified, uncomplicated: Secondary | ICD-10-CM | POA: Diagnosis present

## 2011-02-08 DIAGNOSIS — E1310 Other specified diabetes mellitus with ketoacidosis without coma: Secondary | ICD-10-CM | POA: Diagnosis present

## 2011-02-08 DIAGNOSIS — I252 Old myocardial infarction: Secondary | ICD-10-CM

## 2011-02-08 DIAGNOSIS — E785 Hyperlipidemia, unspecified: Secondary | ICD-10-CM | POA: Diagnosis present

## 2011-02-08 DIAGNOSIS — Z9861 Coronary angioplasty status: Secondary | ICD-10-CM

## 2011-02-08 DIAGNOSIS — I498 Other specified cardiac arrhythmias: Secondary | ICD-10-CM | POA: Diagnosis present

## 2011-02-08 DIAGNOSIS — J189 Pneumonia, unspecified organism: Secondary | ICD-10-CM | POA: Diagnosis not present

## 2011-02-08 DIAGNOSIS — K208 Other esophagitis without bleeding: Principal | ICD-10-CM | POA: Diagnosis present

## 2011-02-08 DIAGNOSIS — B3781 Candidal esophagitis: Secondary | ICD-10-CM | POA: Diagnosis not present

## 2011-02-08 DIAGNOSIS — K2289 Other specified disease of esophagus: Secondary | ICD-10-CM | POA: Diagnosis present

## 2011-02-08 DIAGNOSIS — I2510 Atherosclerotic heart disease of native coronary artery without angina pectoris: Secondary | ICD-10-CM | POA: Diagnosis present

## 2011-02-08 DIAGNOSIS — E860 Dehydration: Secondary | ICD-10-CM | POA: Diagnosis not present

## 2011-02-08 DIAGNOSIS — Z794 Long term (current) use of insulin: Secondary | ICD-10-CM

## 2011-02-08 DIAGNOSIS — D62 Acute posthemorrhagic anemia: Secondary | ICD-10-CM | POA: Diagnosis not present

## 2011-02-08 LAB — DIFFERENTIAL
Basophils Absolute: 0 K/uL (ref 0.0–0.1)
Basophils Relative: 0 % (ref 0–1)
Eosinophils Absolute: 0.1 10*3/uL (ref 0.0–0.7)
Eosinophils Relative: 1 % (ref 0–5)
Lymphocytes Relative: 19 % (ref 12–46)
Lymphs Abs: 1.6 K/uL (ref 0.7–4.0)
Monocytes Absolute: 0.7 K/uL (ref 0.1–1.0)
Monocytes Relative: 8 % (ref 3–12)
Neutro Abs: 5.9 K/uL (ref 1.7–7.7)
Neutrophils Relative %: 71 % (ref 43–77)

## 2011-02-08 LAB — BASIC METABOLIC PANEL WITH GFR
BUN: 27 mg/dL — ABNORMAL HIGH (ref 6–23)
Chloride: 99 meq/L (ref 96–112)
Creatinine, Ser: 1.04 mg/dL (ref 0.50–1.10)
GFR calc Af Amer: >60 mL/min (ref >60)
Glucose, Bld: 171 mg/dL — ABNORMAL HIGH (ref 70–99)
Potassium: 4.8 meq/L (ref 3.5–5.1)

## 2011-02-08 LAB — URINALYSIS, ROUTINE W REFLEX MICROSCOPIC
Bilirubin Urine: NEGATIVE
Glucose, UA: >1000 mg/dL — AB
Ketones, ur: >80 mg/dL — AB
Leukocytes, UA: NEGATIVE
Nitrite: NEGATIVE
Protein, ur: 100 mg/dL — AB
Specific Gravity, Urine: 1.029 (ref 1.005–1.030)
Urobilinogen, UA: 0.2 mg/dL (ref 0.0–1.0)
pH: 5.5 (ref 5.0–8.0)

## 2011-02-08 LAB — BASIC METABOLIC PANEL
CO2: 21 mEq/L (ref 19–32)
Calcium: 9.9 mg/dL (ref 8.4–10.5)
GFR calc non Af Amer: 58 mL/min — ABNORMAL LOW (ref >60)
Sodium: 135 mEq/L (ref 135–145)

## 2011-02-08 LAB — CBC
HCT: 34.4 % — ABNORMAL LOW (ref 36.0–46.0)
Hemoglobin: 11.8 g/dL — ABNORMAL LOW (ref 12.0–15.0)
MCH: 30.3 pg (ref 26.0–34.0)
MCHC: 34.3 g/dL (ref 30.0–36.0)
MCV: 88.4 fL (ref 78.0–100.0)
Platelets: 378 10*3/uL (ref 150–400)
RBC: 3.89 MIL/uL (ref 3.87–5.11)
RDW: 15.8 % — ABNORMAL HIGH (ref 11.5–15.5)
WBC: 8.3 K/uL (ref 4.0–10.5)

## 2011-02-08 LAB — URINE MICROSCOPIC-ADD ON

## 2011-02-08 LAB — HEPATIC FUNCTION PANEL
Bilirubin, Direct: 0.1 mg/dL (ref 0.0–0.3)
Indirect Bilirubin: 0 mg/dL — ABNORMAL LOW (ref 0.3–0.9)
Total Bilirubin: 0.1 mg/dL — ABNORMAL LOW (ref 0.3–1.2)

## 2011-02-08 LAB — LIPASE, BLOOD: Lipase: 18 U/L (ref 11–59)

## 2011-02-08 LAB — GLUCOSE, CAPILLARY: Glucose-Capillary: 214 mg/dL — ABNORMAL HIGH (ref 70–99)

## 2011-02-09 ENCOUNTER — Inpatient Hospital Stay (HOSPITAL_COMMUNITY): Payer: Medicaid Other

## 2011-02-09 LAB — URINALYSIS, MICROSCOPIC ONLY
Ketones, ur: >80 mg/dL — AB
Leukocytes, UA: NEGATIVE
Nitrite: NEGATIVE
Protein, ur: >300 mg/dL — AB
Urobilinogen, UA: 0.2 mg/dL (ref 0.0–1.0)
pH: 5.5 (ref 5.0–8.0)

## 2011-02-09 LAB — CBC
HCT: 36.4 % (ref 36.0–46.0)
Hemoglobin: 12.2 g/dL (ref 12.0–15.0)
MCHC: 33.7 g/dL (ref 30.0–36.0)
MCV: 89.3 fL (ref 78.0–100.0)
Platelets: 409 10*3/uL — ABNORMAL HIGH (ref 150–400)
Platelets: 471 10*3/uL — ABNORMAL HIGH (ref 150–400)
RDW: 15.7 % — ABNORMAL HIGH (ref 11.5–15.5)
RDW: 15.9 % — ABNORMAL HIGH (ref 11.5–15.5)
WBC: 14.1 10*3/uL — ABNORMAL HIGH (ref 4.0–10.5)

## 2011-02-09 LAB — GASTRIC OCCULT BLOOD (1-CARD TO LAB): Occult Blood, Gastric: POSITIVE — AB

## 2011-02-09 LAB — GLUCOSE, CAPILLARY
Glucose-Capillary: 504 mg/dL — ABNORMAL HIGH (ref 70–99)
Glucose-Capillary: 203 mg/dL — ABNORMAL HIGH (ref 70–99)
Glucose-Capillary: 212 mg/dL — ABNORMAL HIGH (ref 70–99)

## 2011-02-09 LAB — COMPREHENSIVE METABOLIC PANEL
ALT: 7 U/L (ref 0–35)
AST: 12 U/L (ref 0–37)
Albumin: 4.2 g/dL (ref 3.5–5.2)
CO2: 14 mEq/L — ABNORMAL LOW (ref 19–32)
Calcium: 10.2 mg/dL (ref 8.4–10.5)
Chloride: 99 mEq/L (ref 96–112)
GFR calc non Af Amer: 33 mL/min — ABNORMAL LOW (ref >60)
Sodium: 143 mEq/L (ref 135–145)
Total Bilirubin: 0.2 mg/dL — ABNORMAL LOW (ref 0.3–1.2)

## 2011-02-09 LAB — BASIC METABOLIC PANEL
BUN: 35 mg/dL — ABNORMAL HIGH (ref 6–23)
Chloride: 100 mEq/L (ref 96–112)
Creatinine, Ser: 1.53 mg/dL — ABNORMAL HIGH (ref 0.50–1.10)
GFR calc Af Amer: 45 mL/min — ABNORMAL LOW (ref >60)
GFR calc non Af Amer: 37 mL/min — ABNORMAL LOW (ref >60)

## 2011-02-09 LAB — RAPID URINE DRUG SCREEN, HOSP PERFORMED
Barbiturates: NOT DETECTED
Cocaine: NOT DETECTED
Opiates: POSITIVE — AB

## 2011-02-09 LAB — TSH: TSH: 0.274 u[IU]/mL — ABNORMAL LOW (ref 0.350–4.500)

## 2011-02-09 LAB — CK TOTAL AND CKMB (NOT AT ARMC)
CK, MB: 1.2 ng/mL (ref 0.3–4.0)
Relative Index: INVALID (ref 0.0–2.5)

## 2011-02-09 LAB — HEMOGLOBIN A1C
Hgb A1c MFr Bld: 6.9 % — ABNORMAL HIGH (ref <5.7)
Mean Plasma Glucose: 151 mg/dL — ABNORMAL HIGH (ref <117)

## 2011-02-09 LAB — TROPONIN I: Troponin I: <0.3 ng/mL (ref <0.30)

## 2011-02-09 MED ORDER — IOHEXOL 300 MG/ML  SOLN
80.0000 mL | Freq: Once | INTRAMUSCULAR | Status: AC | PRN
  Administered 2011-02-09: 80 mL via INTRAVENOUS

## 2011-02-10 ENCOUNTER — Inpatient Hospital Stay (HOSPITAL_COMMUNITY): Payer: Medicaid Other

## 2011-02-10 DIAGNOSIS — R933 Abnormal findings on diagnostic imaging of other parts of digestive tract: Secondary | ICD-10-CM

## 2011-02-10 DIAGNOSIS — R1084 Generalized abdominal pain: Secondary | ICD-10-CM

## 2011-02-10 DIAGNOSIS — K920 Hematemesis: Secondary | ICD-10-CM

## 2011-02-10 DIAGNOSIS — K209 Esophagitis, unspecified: Secondary | ICD-10-CM

## 2011-02-10 LAB — CBC
HCT: 33.1 % — ABNORMAL LOW (ref 36.0–46.0)
MCHC: 33.8 g/dL (ref 30.0–36.0)
Platelets: 425 10*3/uL — ABNORMAL HIGH (ref 150–400)
RDW: 16.2 % — ABNORMAL HIGH (ref 11.5–15.5)
WBC: 26.5 10*3/uL — ABNORMAL HIGH (ref 4.0–10.5)

## 2011-02-10 LAB — PROTIME-INR
INR: 1.01 (ref 0.00–1.49)
Prothrombin Time: 13.5 seconds (ref 11.6–15.2)

## 2011-02-10 LAB — HEPATIC FUNCTION PANEL
Albumin: 3.3 g/dL — ABNORMAL LOW (ref 3.5–5.2)
Total Bilirubin: 0.3 mg/dL (ref 0.3–1.2)
Total Protein: 7.5 g/dL (ref 6.0–8.3)

## 2011-02-10 LAB — DIFFERENTIAL
Eosinophils Absolute: 0 10*3/uL (ref 0.0–0.7)
Eosinophils Relative: 0 % (ref 0–5)
Lymphs Abs: 1.9 10*3/uL (ref 0.7–4.0)
Monocytes Absolute: 1.9 10*3/uL — ABNORMAL HIGH (ref 0.1–1.0)
Neutrophils Relative %: 86 % — ABNORMAL HIGH (ref 43–77)

## 2011-02-10 LAB — COMPREHENSIVE METABOLIC PANEL
Albumin: 3.5 g/dL (ref 3.5–5.2)
BUN: 26 mg/dL — ABNORMAL HIGH (ref 6–23)
CO2: 17 mEq/L — ABNORMAL LOW (ref 19–32)
Calcium: 9.2 mg/dL (ref 8.4–10.5)
Chloride: 109 mEq/L (ref 96–112)
Creatinine, Ser: 1.22 mg/dL — ABNORMAL HIGH (ref 0.50–1.10)
GFR calc non Af Amer: 48 mL/min — ABNORMAL LOW (ref >60)
Total Bilirubin: 0.3 mg/dL (ref 0.3–1.2)

## 2011-02-10 LAB — HEMOGLOBIN AND HEMATOCRIT, BLOOD: HCT: 32.9 % — ABNORMAL LOW (ref 36.0–46.0)

## 2011-02-10 LAB — GLUCOSE, CAPILLARY
Glucose-Capillary: 160 mg/dL — ABNORMAL HIGH (ref 70–99)
Glucose-Capillary: 203 mg/dL — ABNORMAL HIGH (ref 70–99)
Glucose-Capillary: 215 mg/dL — ABNORMAL HIGH (ref 70–99)
Glucose-Capillary: 307 mg/dL — ABNORMAL HIGH (ref 70–99)
Glucose-Capillary: 348 mg/dL — ABNORMAL HIGH (ref 70–99)

## 2011-02-10 LAB — HEMOGLOBIN A1C
Hgb A1c MFr Bld: 7 % — ABNORMAL HIGH (ref <5.7)
Mean Plasma Glucose: 154 mg/dL — ABNORMAL HIGH (ref <117)

## 2011-02-10 LAB — MAGNESIUM: Magnesium: 2.2 mg/dL (ref 1.5–2.5)

## 2011-02-10 MED ORDER — TECHNETIUM TO 99M ALBUMIN AGGREGATED
6.0000 | Freq: Once | INTRAVENOUS | Status: AC | PRN
  Administered 2011-02-10: 6 via INTRAVENOUS

## 2011-02-10 MED ORDER — XENON XE 133 GAS
10.0000 | GAS_FOR_INHALATION | Freq: Once | RESPIRATORY_TRACT | Status: AC | PRN
  Administered 2011-02-10: 10 via RESPIRATORY_TRACT

## 2011-02-11 ENCOUNTER — Inpatient Hospital Stay (HOSPITAL_COMMUNITY): Payer: Medicaid Other

## 2011-02-11 DIAGNOSIS — R933 Abnormal findings on diagnostic imaging of other parts of digestive tract: Secondary | ICD-10-CM

## 2011-02-11 DIAGNOSIS — K222 Esophageal obstruction: Secondary | ICD-10-CM

## 2011-02-11 DIAGNOSIS — K208 Other esophagitis: Secondary | ICD-10-CM

## 2011-02-11 DIAGNOSIS — K920 Hematemesis: Secondary | ICD-10-CM

## 2011-02-11 LAB — DIFFERENTIAL
Basophils Relative: 0 % (ref 0–1)
Eosinophils Relative: 0 % (ref 0–5)
Lymphocytes Relative: 3 % — ABNORMAL LOW (ref 12–46)
Monocytes Relative: 7 % (ref 3–12)
Neutrophils Relative %: 90 % — ABNORMAL HIGH (ref 43–77)

## 2011-02-11 LAB — GLUCOSE, CAPILLARY
Glucose-Capillary: 164 mg/dL — ABNORMAL HIGH (ref 70–99)
Glucose-Capillary: 257 mg/dL — ABNORMAL HIGH (ref 70–99)

## 2011-02-11 LAB — CBC
HCT: 28.7 % — ABNORMAL LOW (ref 36.0–46.0)
MCV: 91.1 fL (ref 78.0–100.0)
RBC: 3.15 MIL/uL — ABNORMAL LOW (ref 3.87–5.11)
WBC: 20.9 10*3/uL — ABNORMAL HIGH (ref 4.0–10.5)

## 2011-02-11 LAB — BASIC METABOLIC PANEL
CO2: 14 mEq/L — ABNORMAL LOW (ref 19–32)
CO2: 17 mEq/L — ABNORMAL LOW (ref 19–32)
Calcium: 8.4 mg/dL (ref 8.4–10.5)
Calcium: 8.7 mg/dL (ref 8.4–10.5)
Creatinine, Ser: 1.23 mg/dL — ABNORMAL HIGH (ref 0.50–1.10)
Creatinine, Ser: 1.07 mg/dL (ref 0.50–1.10)
GFR calc non Af Amer: 47 mL/min — ABNORMAL LOW (ref >60)
GFR calc non Af Amer: 56 mL/min — ABNORMAL LOW (ref >60)
Glucose, Bld: 146 mg/dL — ABNORMAL HIGH (ref 70–99)
Glucose, Bld: 162 mg/dL — ABNORMAL HIGH (ref 70–99)
Sodium: 142 mEq/L (ref 135–145)

## 2011-02-11 LAB — COMPREHENSIVE METABOLIC PANEL
AST: 8 U/L (ref 0–37)
Alkaline Phosphatase: 108 U/L (ref 39–117)
BUN: 14 mg/dL (ref 6–23)
BUN: 18 mg/dL (ref 6–23)
CO2: 8 mEq/L — CL (ref 19–32)
CO2: 6 mEq/L — CL (ref 19–32)
Chloride: 112 mEq/L (ref 96–112)
Creatinine, Ser: 1.14 mg/dL — ABNORMAL HIGH (ref 0.50–1.10)
GFR calc Af Amer: 49 mL/min — ABNORMAL LOW (ref >60)
GFR calc non Af Amer: 52 mL/min — ABNORMAL LOW (ref >60)
GFR calc non Af Amer: 41 mL/min — ABNORMAL LOW (ref >60)
Glucose, Bld: 307 mg/dL — ABNORMAL HIGH (ref 70–99)
Potassium: 3.9 mEq/L (ref 3.5–5.1)
Total Bilirubin: 0.3 mg/dL (ref 0.3–1.2)
Total Protein: 6.6 g/dL (ref 6.0–8.3)

## 2011-02-11 LAB — HEMOGLOBIN AND HEMATOCRIT, BLOOD
HCT: 28.3 % — ABNORMAL LOW (ref 36.0–46.0)
Hemoglobin: 10 g/dL — ABNORMAL LOW (ref 12.0–15.0)

## 2011-02-11 LAB — POCT I-STAT 3, ART BLOOD GAS (G3+)
O2 Saturation: 65 %
O2 Saturation: 97 %
Patient temperature: 98.3
TCO2: 15 mmol/L (ref 0–100)
pCO2 arterial: 17.3 mmHg — CL (ref 35.0–45.0)
pH, Arterial: 7.31 — ABNORMAL LOW (ref 7.350–7.400)
pO2, Arterial: 42 mmHg — ABNORMAL LOW (ref 80.0–100.0)

## 2011-02-11 LAB — MRSA PCR SCREENING: MRSA by PCR: NEGATIVE

## 2011-02-11 LAB — LACTIC ACID, PLASMA: Lactic Acid, Venous: 1.1 mmol/L (ref 0.5–2.2)

## 2011-02-12 DIAGNOSIS — K920 Hematemesis: Secondary | ICD-10-CM

## 2011-02-12 DIAGNOSIS — K209 Esophagitis, unspecified: Secondary | ICD-10-CM

## 2011-02-12 DIAGNOSIS — R933 Abnormal findings on diagnostic imaging of other parts of digestive tract: Secondary | ICD-10-CM

## 2011-02-12 DIAGNOSIS — E1169 Type 2 diabetes mellitus with other specified complication: Secondary | ICD-10-CM

## 2011-02-12 DIAGNOSIS — R1084 Generalized abdominal pain: Secondary | ICD-10-CM

## 2011-02-12 DIAGNOSIS — B3781 Candidal esophagitis: Secondary | ICD-10-CM

## 2011-02-12 DIAGNOSIS — E872 Acidosis: Secondary | ICD-10-CM

## 2011-02-12 LAB — BASIC METABOLIC PANEL
BUN: 10 mg/dL (ref 6–23)
BUN: 7 mg/dL (ref 6–23)
CO2: 20 mEq/L (ref 19–32)
CO2: 20 mEq/L (ref 19–32)
Chloride: 109 mEq/L (ref 96–112)
Chloride: 107 mEq/L (ref 96–112)
Chloride: 109 mEq/L (ref 96–112)
Chloride: 100 mEq/L (ref 96–112)
Creatinine, Ser: 0.94 mg/dL (ref 0.50–1.10)
Creatinine, Ser: 0.81 mg/dL (ref 0.50–1.10)
Creatinine, Ser: 0.74 mg/dL (ref 0.50–1.10)
Creatinine, Ser: 0.68 mg/dL (ref 0.50–1.10)
GFR calc Af Amer: >60 mL/min (ref >60)
GFR calc Af Amer: >60 mL/min (ref >60)
GFR calc Af Amer: >60 mL/min (ref >60)
Glucose, Bld: 159 mg/dL — ABNORMAL HIGH (ref 70–99)
Glucose, Bld: 343 mg/dL — ABNORMAL HIGH (ref 70–99)
Potassium: 3.6 mEq/L (ref 3.5–5.1)
Sodium: 139 mEq/L (ref 135–145)
Sodium: 132 mEq/L — ABNORMAL LOW (ref 135–145)

## 2011-02-12 LAB — GLUCOSE, CAPILLARY
Glucose-Capillary: 193 mg/dL — ABNORMAL HIGH (ref 70–99)
Glucose-Capillary: 165 mg/dL — ABNORMAL HIGH (ref 70–99)
Glucose-Capillary: 146 mg/dL — ABNORMAL HIGH (ref 70–99)
Glucose-Capillary: 146 mg/dL — ABNORMAL HIGH (ref 70–99)
Glucose-Capillary: 132 mg/dL — ABNORMAL HIGH (ref 70–99)
Glucose-Capillary: 276 mg/dL — ABNORMAL HIGH (ref 70–99)
Glucose-Capillary: 243 mg/dL — ABNORMAL HIGH (ref 70–99)
Glucose-Capillary: 161 mg/dL — ABNORMAL HIGH (ref 70–99)

## 2011-02-12 LAB — CBC
HCT: 27 % — ABNORMAL LOW (ref 36.0–46.0)
Hemoglobin: 9.1 g/dL — ABNORMAL LOW (ref 12.0–15.0)
MCV: 88.8 fL (ref 78.0–100.0)
RBC: 3.04 MIL/uL — ABNORMAL LOW (ref 3.87–5.11)
WBC: 21.9 10*3/uL — ABNORMAL HIGH (ref 4.0–10.5)

## 2011-02-13 ENCOUNTER — Other Ambulatory Visit: Payer: Self-pay | Admitting: Gastroenterology

## 2011-02-13 LAB — CBC
MCH: 29.8 pg (ref 26.0–34.0)
MCV: 87.4 fL (ref 78.0–100.0)
Platelets: 266 10*3/uL (ref 150–400)
RDW: 15.9 % — ABNORMAL HIGH (ref 11.5–15.5)
WBC: 12.4 10*3/uL — ABNORMAL HIGH (ref 4.0–10.5)

## 2011-02-13 LAB — BASIC METABOLIC PANEL
Calcium: 8.5 mg/dL (ref 8.4–10.5)
Chloride: 101 mEq/L (ref 96–112)
Creatinine, Ser: 0.7 mg/dL (ref 0.50–1.10)
GFR calc Af Amer: >60 mL/min (ref >60)
Sodium: 133 mEq/L — ABNORMAL LOW (ref 135–145)

## 2011-02-13 LAB — GLUCOSE, CAPILLARY: Glucose-Capillary: 308 mg/dL — ABNORMAL HIGH (ref 70–99)

## 2011-02-14 DIAGNOSIS — K920 Hematemesis: Secondary | ICD-10-CM

## 2011-02-14 DIAGNOSIS — K209 Esophagitis, unspecified: Secondary | ICD-10-CM

## 2011-02-14 DIAGNOSIS — R1084 Generalized abdominal pain: Secondary | ICD-10-CM

## 2011-02-14 LAB — BASIC METABOLIC PANEL
Calcium: 9.2 mg/dL (ref 8.4–10.5)
Creatinine, Ser: 0.89 mg/dL (ref 0.50–1.10)
GFR calc Af Amer: >60 mL/min (ref >60)
GFR calc non Af Amer: >60 mL/min (ref >60)

## 2011-02-14 LAB — GLUCOSE, CAPILLARY
Glucose-Capillary: 156 mg/dL — ABNORMAL HIGH (ref 70–99)
Glucose-Capillary: 158 mg/dL — ABNORMAL HIGH (ref 70–99)
Glucose-Capillary: 159 mg/dL — ABNORMAL HIGH (ref 70–99)
Glucose-Capillary: 170 mg/dL — ABNORMAL HIGH (ref 70–99)
Glucose-Capillary: 223 mg/dL — ABNORMAL HIGH (ref 70–99)
Glucose-Capillary: 75 mg/dL (ref 70–99)

## 2011-02-14 LAB — CBC
MCH: 29.3 pg (ref 26.0–34.0)
MCHC: 33.5 g/dL (ref 30.0–36.0)
MCV: 87.5 fL (ref 78.0–100.0)
Platelets: 311 10*3/uL (ref 150–400)
RDW: 16 % — ABNORMAL HIGH (ref 11.5–15.5)

## 2011-02-14 LAB — MAGNESIUM: Magnesium: 1.7 mg/dL (ref 1.5–2.5)

## 2011-02-14 NOTE — H&P (Signed)
NAME:  [name] [name], [name] [name]                    ACCOUNT NO.:  [id number]  MEDICAL RECORD NO.:  [id number]  LOCATION:  [facility]                         FACILITY:  [facility]  PHYSICIAN:  Eduard Clos, MDDATE OF BIRTH:  Jul 17, 1966  DATE OF ADMISSION:  02/08/2011 DATE OF DISCHARGE:                             HISTORY & PHYSICAL   PRIMARY CARE PHYSICIAN:  [facility].  PRIMARY CARDIOLOGIST:  Dr. Eldridge Dace  CHIEF COMPLAINT:  Nausea, vomiting.  HISTORY OF PRESENTING ILLNESS:  A 45 year old female with known history of diabetes mellitus type 2 on insulin, history of CAD status post stenting, history of previous MI, hypertension, hyperlipidemia, polysubstance abuse including cocaine, marijuana, alcohol, and has been experiencing some nausea and vomiting with diarrhea and abdominal pain over the last 24 hours which has been progressively worsening and the patient is not able to keep anything.  The patient states the nausea, vomiting started yesterday morning.  She had gone to her PCP yesterday at noon time and was prescribed some antibiotics for sinusitis. Eventually, the patient was already able to take anything and her nausea and vomiting worsened with some diarrhea and abdominal pain.  She decided to come to the ER.  In the ER at this time, the patient was found to have blood sugar of 29.  She was given D-50 after which her blood sugars came up to 300.  The patient has been admitted for intractable nausea, vomiting, abdominal pain with diarrhea and hypoglycemia with Navia blood sugar.  The patient says her blood sugar has been running off and on for last 1 week sometimes [place] and sometimes very low but denies any chest pain, shortness of breath.  Denies any loss of consciousness, any focal deficit.  Denies any dysuria or discharge.  The patient's abdominal pain is mostly in the epigastric area.  It is dull aching, it comes off and on and she is unable to take anything because the pain worsened  and also it worsens her nausea and vomiting.  PAST MEDICAL HISTORY: 1. History of coronary disease status post stenting with history of     MI. 2. History of diabetes mellitus type 2 on insulin. 3. History of dyslipidemia. 4. History of hypertension. 5. History of polysubstance abuse including cocaine, marijuana, and     alcohol. 6. History of GERD. 7. History of anxiety and depression.  MEDICATIONS ON ADMISSION:  It has to be verified it includes ferrous sulfate, folic acid, Lantus insulin 12 units in the morning and NovoLog sliding scale .  Lisinopril, metoprolol tartrate, nitroglycerin, NovoLog, Plavix, pravastatin, Reglan, sertraline, tramadol, Trexall. All these medications has to be verified including the dose.  ALLERGIES:  CODEINE causes itching.  FAMILY HISTORY:  Negative for any coronary artery disease.  SOCIAL HISTORY:  She smokes cigarettes.  Used to drink alcohol and has not drunk alcohol for many years now.  Denies any drug abuse.  She used to use cocaine.  REVIEW OF SYSTEMS:  As per the history of present illness, nothing else significant.  PHYSICAL EXAMINATION:  GENERAL:  The patient examined at bedside not in acute distress. VITAL SIGNS:  Blood pressure is 140/70, pulse is 100 per minute, temperature  98.4, respirations 18 per minute, O2 sat 100%. HEENT:  Anicteric.  No pallor.  No discharge from ears, eyes, nose, or mouth. CHEST:  Bilateral air entry present.  No rhonchi.  No crepitation. HEART:  S1 and S2 heard. ABDOMEN:  Soft, mild tenderness particularly in the gastric area.  No guarding or rigidity. CNS:  Alert, awake, and oriented to time, place, person.  Moves upper and lower extremity 5/5. EXTREMITIES:  Peripheral pulses felt.  No edema.  LABORATORY DATA:  Labs EKG has been ordered along with cardiac enzyme. CT abdomen and pelvis also has been ordered.  CBC WBC is 8.3, hemoglobin 11.8, hematocrit 34.4, platelets 378.  Complete metabolic panel  sodium 135, potassium 4.8, chloride 99, carbon dioxide 21, anion gap is 15, glucose 171, BUN 25, creatinine 1, total bilirubin is 0.1, alkaline phosphatase 124, AST 19, ALT 5, total protein 8.74, calcium 9.9, lipase 18.  Pregnancy screen is negative.  UA shows negative nitrites, leukocyte, more than 1000 glucose, ketones more than 80, bacteria rare.  ASSESSMENT: 1. Intractable nausea and vomiting with abdominal pain and diarrhea. 2. Diabetes mellitus type 2 with hypoglycemic spells. 3. History of coronary artery disease status post stenting with     history of myocardial infarction. 4. History of polysubstance abuse. 5. History of hypertension. 6. History of hyperlipidemia.  PLAN: 1. At this time, admit the patient to medical floor. 2. For her intractable nausea, vomiting, abdominal pain with diarrhea     at this time, we will get CT abdomen and pelvis.  We will check     lactic acid level.  I am going to keep the patient n.p.o.  I will     her on Protonix hydrate.  The patient also check a lactic acid     level based on the patient's clinical condition.  If nausea,     vomiting improves, we will start clear liquid diet and advance.     Further recommendation will be based on her renal condition and CT     abdomen and pelvis. 3. Labile diabetes mellitus type 2.  The patient came to the ER her     blood sugar was 29 which improved with D-50.  At this time, I am     going to keep the patient on CBG checks q. 2 hourly and her blood     sugars have been stable and will change to a.c. and nightly once     she start diet.  Otherwise will be kept on q.4.  Her Lantus dose     was adjusted to 30 units in the a.m. which at this time has been on     hold because of the hypoglycemic spells     which can be resumed once the patient start eating or if her blood     sugar is more stable. 4. At this time, I am also going to check urine drug screen and     further cardiac enzymes and EKG. 5.  Further recommendation based on test order and clinic course.     Eduard Clos, MD     ANK/MEDQ  D:  02/09/2011  T:  02/09/2011  Job:  [id number]  cc:   Dr. Clydia Llano  Electronically Signed by Midge Minium MD on 02/14/2011 07:26:55 AM

## 2011-02-15 DIAGNOSIS — K209 Esophagitis, unspecified: Secondary | ICD-10-CM

## 2011-02-15 DIAGNOSIS — R1084 Generalized abdominal pain: Secondary | ICD-10-CM

## 2011-02-15 DIAGNOSIS — R933 Abnormal findings on diagnostic imaging of other parts of digestive tract: Secondary | ICD-10-CM

## 2011-02-15 LAB — GLUCOSE, CAPILLARY
Glucose-Capillary: 189 mg/dL — ABNORMAL HIGH (ref 70–99)
Glucose-Capillary: 199 mg/dL — ABNORMAL HIGH (ref 70–99)
Glucose-Capillary: 285 mg/dL — ABNORMAL HIGH (ref 70–99)

## 2011-02-15 LAB — CBC
MCH: 29.6 pg (ref 26.0–34.0)
MCHC: 33.8 g/dL (ref 30.0–36.0)
MCV: 87.4 fL (ref 78.0–100.0)
Platelets: 363 10*3/uL (ref 150–400)

## 2011-02-16 ENCOUNTER — Inpatient Hospital Stay (HOSPITAL_COMMUNITY): Payer: Medicaid Other

## 2011-02-16 DIAGNOSIS — R1084 Generalized abdominal pain: Secondary | ICD-10-CM

## 2011-02-16 DIAGNOSIS — K209 Esophagitis, unspecified without bleeding: Secondary | ICD-10-CM

## 2011-02-16 DIAGNOSIS — K3184 Gastroparesis: Secondary | ICD-10-CM

## 2011-02-16 LAB — CULTURE, BLOOD (ROUTINE X 2)
Culture  Setup Time: 201207131034
Culture: NO GROWTH

## 2011-02-16 LAB — BASIC METABOLIC PANEL
BUN: 13 mg/dL (ref 6–23)
Calcium: 9.3 mg/dL (ref 8.4–10.5)
GFR calc Af Amer: >60 mL/min (ref >60)
GFR calc non Af Amer: >60 mL/min (ref >60)
Glucose, Bld: 358 mg/dL — ABNORMAL HIGH (ref 70–99)
Potassium: 3.8 mEq/L (ref 3.5–5.1)

## 2011-02-16 LAB — CBC
HCT: 28.1 % — ABNORMAL LOW (ref 36.0–46.0)
MCH: 30 pg (ref 26.0–34.0)
MCHC: 34.5 g/dL (ref 30.0–36.0)
RDW: 15.6 % — ABNORMAL HIGH (ref 11.5–15.5)

## 2011-02-16 LAB — GLUCOSE, CAPILLARY
Glucose-Capillary: 231 mg/dL — ABNORMAL HIGH (ref 70–99)
Glucose-Capillary: 351 mg/dL — ABNORMAL HIGH (ref 70–99)

## 2011-02-16 MED ORDER — TECHNETIUM TC 99M SULFUR COLLOID
2.0000 | Freq: Once | INTRAVENOUS | Status: AC | PRN
  Administered 2011-02-16: 2 via INTRAVENOUS

## 2011-02-17 LAB — CBC
Hemoglobin: 9.3 g/dL — ABNORMAL LOW (ref 12.0–15.0)
MCHC: 33.5 g/dL (ref 30.0–36.0)
RDW: 15.9 % — ABNORMAL HIGH (ref 11.5–15.5)

## 2011-02-17 LAB — GLUCOSE, CAPILLARY
Glucose-Capillary: 388 mg/dL — ABNORMAL HIGH (ref 70–99)
Glucose-Capillary: 287 mg/dL — ABNORMAL HIGH (ref 70–99)

## 2011-02-25 NOTE — Discharge Summary (Signed)
NAME:  [name] [name], [name] [name]                    ACCOUNT NO.:  [id number]  MEDICAL RECORD NO.:  [id number]  LOCATION:  5508                         FACILITY:  [facility]  PHYSICIAN:  Conley Canal, MD      DATE OF BIRTH:  [birth year]  DATE OF ADMISSION:  02/08/2011 DATE OF DISCHARGE:  02/17/2011                              DISCHARGE SUMMARY   PRIMARY CARE PHYSICIAN:  Bo Merino, MD  GASTROENTEROLOGIST:  Venita Lick. Russella Dar, MD, [facility]  DISCHARGE DIAGNOSES: 1. Chest pain secondary to severe erosive esophagitis with possible     candidiasis. 2. Hematemesis. 3. Acute blood loss anemia. 4. 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 5. Type 2 diabetes with episodes of hyper and hypoglycemia. 6. Community-acquired pneumonia. 7. Gastroparesis. 8. Episode of anion gap acidosis.    The rest of the patient's diagnoses     are consistent with her past medical history and include coronary     artery disease with stent placement, dyslipidemia, hypertension,     polysubstance abuse, gastroesophageal reflux disease, anxiety and     depression.  DISCHARGE MEDICATIONS: 1. Reglan 10 mg by mouth three times a day before meals. 2. Zofran 4 mg by mouth every 6 hours as needed for nausea. 3. Sucralfate 1 g per 10 mL solutions 1 g by mouth before meals and at     bedtime. 4. Diflucan 100 mg 1 pill by mouth daily. 5. Levaquin 750 mg 1 pill by mouth daily take for two more days. 6. Omeprazole 40 mg 1 tablet by mouth twice daily take for 30 days. 7. Clopidogrel 75 mg 1 tablet by mouth daily. 8. Folic acid 1 mg 1 tablet by mouth daily. 9. Lantus 40 units subcutaneously every morning. 10.Lisinopril 5 mg 1 tablet by mouth daily. 11.Methotrexate 2.5 mg 6 tablets by mouth once a week on Wednesday. 12.Metoprolol tartrate 25 mg half tablet by mouth twice daily. 13.NovoLog  FlexPen 4-16 units subcutaneously three times a day     sliding scale. 14.Pravastatin 40 mg 1 tablet by mouth daily. 15.Sertraline 100  mg 1-1/2 tablets by mouth daily. 16.Tramadol 50 mg 1 tablet by mouth every 6 hours as needed for pain.  CONDITION AT THE TIME OF DISCHARGE:  The patient is still complaining of some epigastric pain that is stabbing in nature.  This is intermittent, however, she is stable for discharge to home.  She is eating small amounts of liquids and low fiber solids and is having regular bowel movements.  CONSULTATIONS DURING THIS ADMISSION: 1. [facility], Dr. Harlene Salts. 2. Critical Care, Dr. Micah Flesher.  HISTORY AND BRIEF HOSPITAL COURSE:  This is a 45 year old female with a known history of diabetes on insulin, also with a history of coronary artery disease status post stenting, hyperlipidemia, hypertension, polysubstance abuse including cocaine, marijuana and alcohol.  She came into the emergency department after she had experienced nausea and vomiting and diarrhea along with abdominal pain over the last 24 hours. She has not been able to keep any food or medications down.  She gone to her PCP the previous day and was prescribed antibiotics for sinusitis. However, the patient was unable to keep these  down as well.  In the emergency department, she was found to have a blood sugar of  29.  This was corrected with D50.  She was admitted to the Hospitalist Service with intractable nausea, vomiting, abdominal pain, and diarrhea.  A CT abdomen was ordered.  The patient was made n.p.o.  She was given IV fluid hydration and Protonix.  CBG checks were started every 2 hours to ensure that her glucose level was stabilized.  [facility] was called and when they saw the patient she was having hematemesis. They felt an endoscopy was needed.  The patient underwent endoscopy on February 11, 2011.  ENDOSCOPIC IMPRESSION: 1. Severe hemorrhagic esophagitis in the distal esophagus suspected     candidiasis and gastroesophageal reflux disease. 2. Stricture at the gastrointestinal  junction.  RECOMMENDATIONS FROM [facility]: 1. Anti-reflux regimen long-term. 2. Check pathology results. 3. PPI b.i.d. long-term for GERD with stricture. 4. Diflucan for 10-14 days pending pathology review.  Later that afternoon, a BMET was drawn and the patient was found to have a CO2 of 8.  Rapid response was called.  They were unable to obtain an ABG as the patient's artery would collapse despite having good pulses. Dr. Ashley Royalty of the Hospitalist Service was paged and 2 liters of fluid were ordered.  The patient was found to have metabolic acidosis likely DKA versus lactic acidosis.  The patient continued to decompensate and Pulmonary Critical Care was called.  The patient was seen by Dr. Micah Flesher.  The patient was put on a glucose stabilizer until her blood sugars have stabilized.  She was started on sips of clears but had significant pain secondary to her severe esophagitis.  Over the course of her hospital stay, the patient was evaluated by Physical Therapy. The Physical Therapy felt that the patient would be able to go home to be cared for by her family and that she would need a home health PT.    On February 11, 2011, the patient had a CT abdomen and pelvis and a CT of her chest.  She was found to have multifocal bilateral airspace consolidation, most severe on the left lower lobe consistent with pneumonia.  She also had a borderline right paratracheal lymph node enlargement.  This was all thought to be secondary to her excessive amount of vomiting.  The patient's DKA resolved.  She was placed on IV antibiotic therapy for her aspiration pneumonia.  She was also treated with b.i.d. PPIs, Reglan, and Carafate for her erosive esophagitis.  She did undergo a gastric emptying study on February 16, 2011, and was found to have markedly delayed gastric emptying consistent with gastroparesis. Consequently, the patient will be kept on 10 mg of Reglan before each meal.     Today, the patient's vital signs were stable.  Her labs have significantly improved.  She reports that she is ready for discharge to home.  We have discussed a gastroparesis diet.  She understands that she should not eat raw vegetables, salad or things with [place] fiber.  She should stick to a lot of liquids and low fiber foods such as potatoes, noodles, etc.  PERTINENT LABORATORIES THIS ADMISSION:  At the peak of her illness, the patient had a CBC with a white count of 26.5, hemoglobin 11.2, hematocrit 33.1, platelets 425.  An ABG was drawn on February 11, 2011, that showed pH of 7.158, pCO2 of 17.3 and a pO2 of 42.8.  Eight hours later, the patient's repeat ABG showed a pH  of 7.31, pCO2 27.5, and pO2 of 102. During her stay, she had a D-dimer that was elevated at 2.34.  Her followup V/Q scan was negative.  She had a TSH of 0.274, a free T4 0.93 and a free T3 of 1.8.  Blood cultures were drawn on February 10, 2011, no growth found after 5 days, this is final.  CBC done today the day of discharge shows WBC 8.6, hemoglobin 9.3, hematocrit 27.8, platelets 429.  RADIOLOGICAL EXAMINATION:  On February 09, 2011, the patient had a PICC placed for access.  There was no acute cardiopulmonary process.  On February 09, 2011, she had a CT of her abdomen and pelvis with contrast that showed significant thickening of the visualized portion of the distal esophagus.  This may represent prominent esophagitis.  She had enlarged uterus with multiple irregular fibroids, advanced atherosclerotic-type changes and prominent coronary artery calcification of the aorta and iliac arteries.  Also she had an under distended colon with slightly thickened walls.  This may be related to under distention although this limits evaluation for possibility of colitis.  V/Q scan done February 10, 2011, was negative for pulmonary embolus.  CT scan of her chest, abdomen and pelvis on February 11, 2011, shows dense airspace consolidation identified  in the lingular portion of the left upper lobe and left lower lobe, patchy airspace densities are also noted in the right upper lobe and right base.  CT of her abdomen and pelvis showed mild wall thickening involving the antrum of the stomach which may represent nonspecific gastritis.  Also showed enlarged fibroid uterus and no free fluid or intraperitoneal air.  Finally, her gastric emptying study done February 16, 2011, showed markedly delayed gastric emptying consistent with gastroparesis.  Essentially all of the original activity remained in the stomach at the 2-hour time frame.  DISCHARGE INSTRUCTIONS:  The patient will be discharged to home with followup home health physical therapy.  She is to increase her activity slowly.  She understands that she is post-diabetic and low fiber diet.  FOLLOWUP APPOINTMENTS:  She is to see Dr. Philipp Deputy with [facility] on February 21, 2011, at 11:15.  She is to see Dr. Claudette Head to follow up on her severe erosive esophagitis and her biopsy results at [facility].  She is to call for appointment to be seen within 2 weeks.  She will have [facility] for home health PT and RN as needed.     Stephani Police, PA   ______________________________ Conley Canal, MD    MLY/MEDQ  D:  02/17/2011  T:  02/18/2011  Job:  [id number]  cc:   Venita Lick. Russella Dar, MD, [facility] Perches L. Philipp Deputy, M.D.  Electronically Signed by Algis Downs PA on 02/23/2011 09:10:11 AM Electronically Signed by Conley Canal  on 02/25/2011 04:21:10 PM

## 2011-02-28 ENCOUNTER — Encounter: Payer: Self-pay | Admitting: Gastroenterology

## 2011-02-28 ENCOUNTER — Encounter: Payer: Self-pay | Admitting: Internal Medicine

## 2011-03-01 HISTORY — PX: IRRIGATION AND DEBRIDEMENT SEBACEOUS CYST: SHX5255

## 2011-03-15 ENCOUNTER — Ambulatory Visit: Payer: Medicaid Other | Admitting: Gastroenterology

## 2011-03-29 ENCOUNTER — Encounter: Payer: Self-pay | Admitting: Gastroenterology

## 2011-03-30 ENCOUNTER — Encounter: Payer: Self-pay | Admitting: Gastroenterology

## 2011-04-04 ENCOUNTER — Ambulatory Visit: Payer: Medicaid Other | Admitting: Internal Medicine

## 2011-04-06 IMAGING — CR DG CHEST 2V
2 series · 2 of 2 positions shown · non-contrast
Comparison: 10/07/2009

CLINICAL DATA: Chest pain.  History of coronary artery disease.

CHEST - 2 VIEW

[w chest pa]
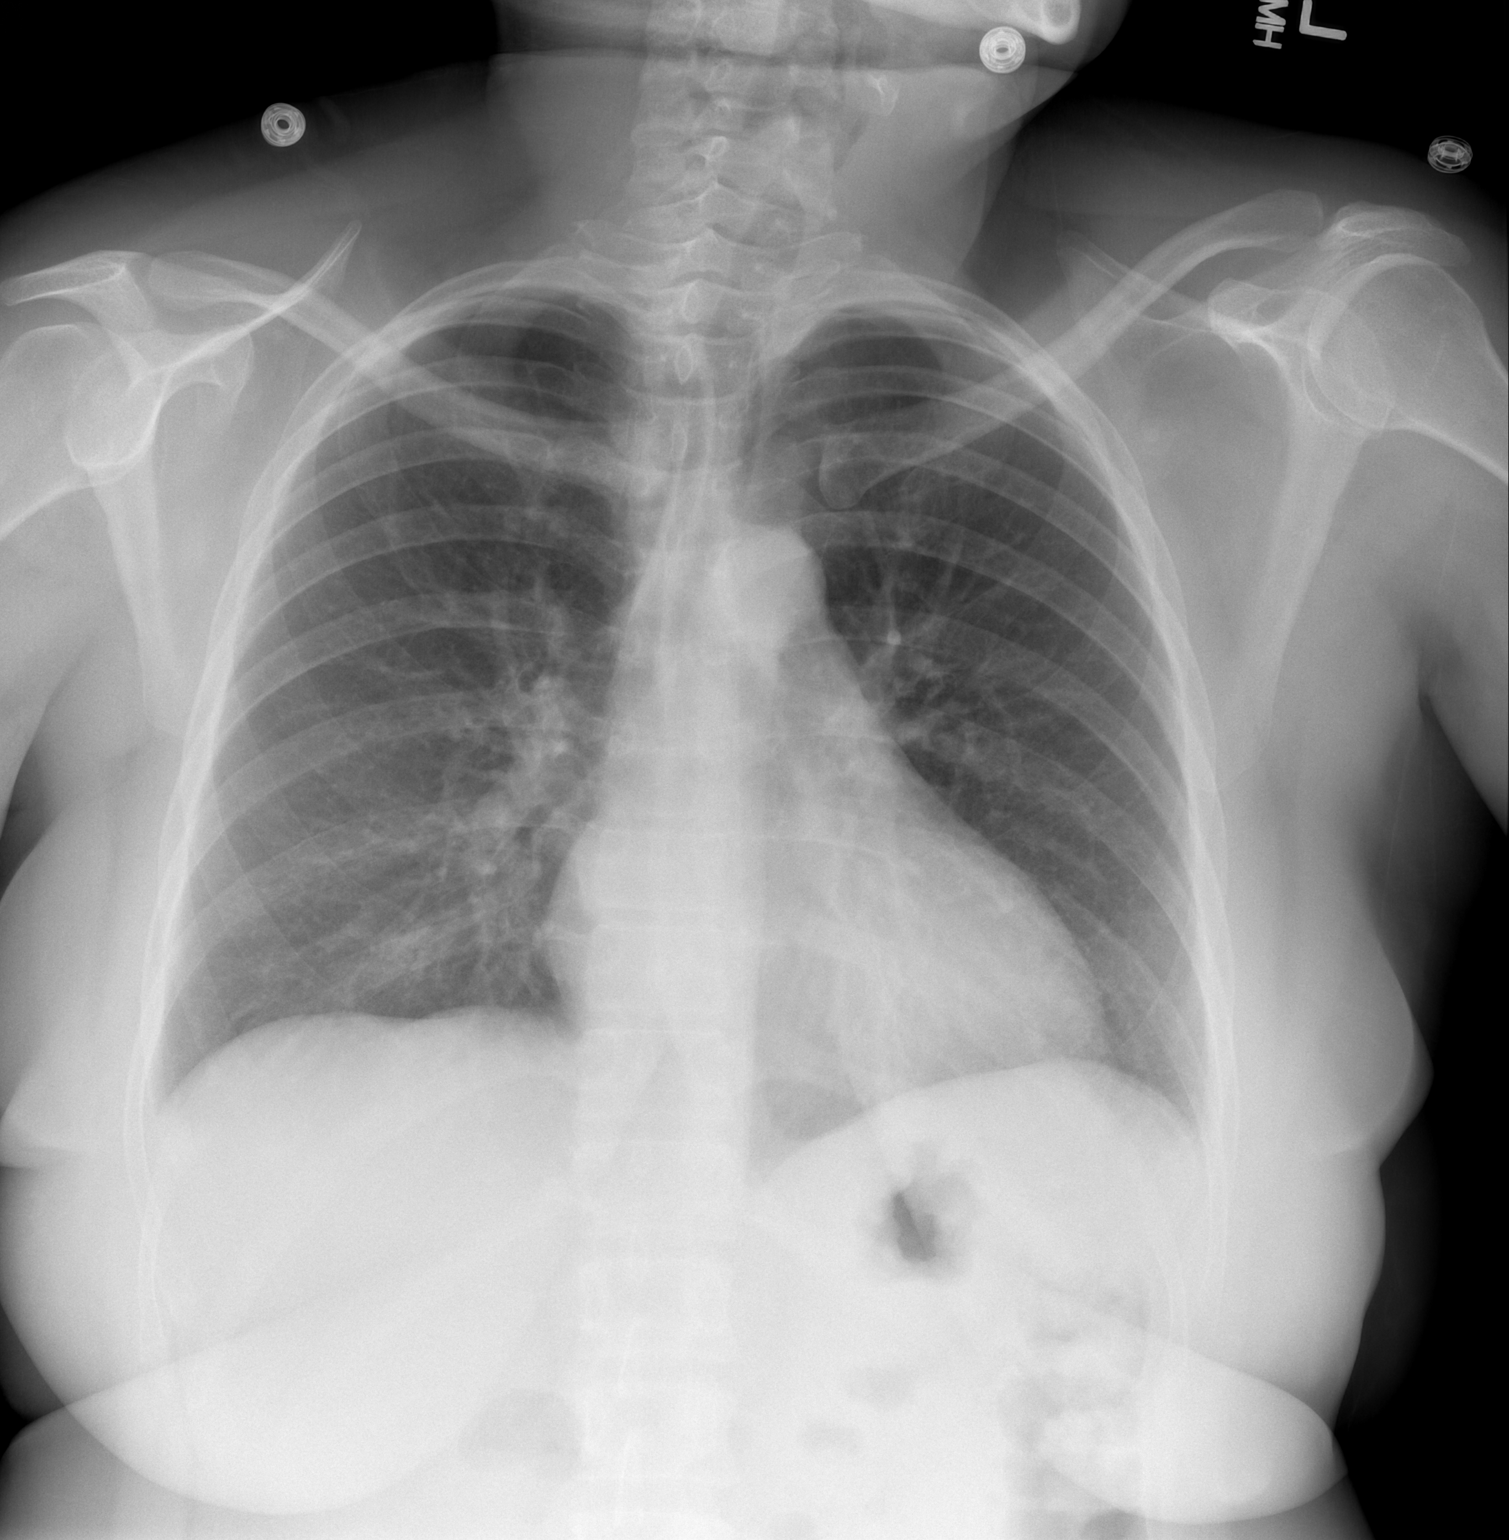

[w chest lat]
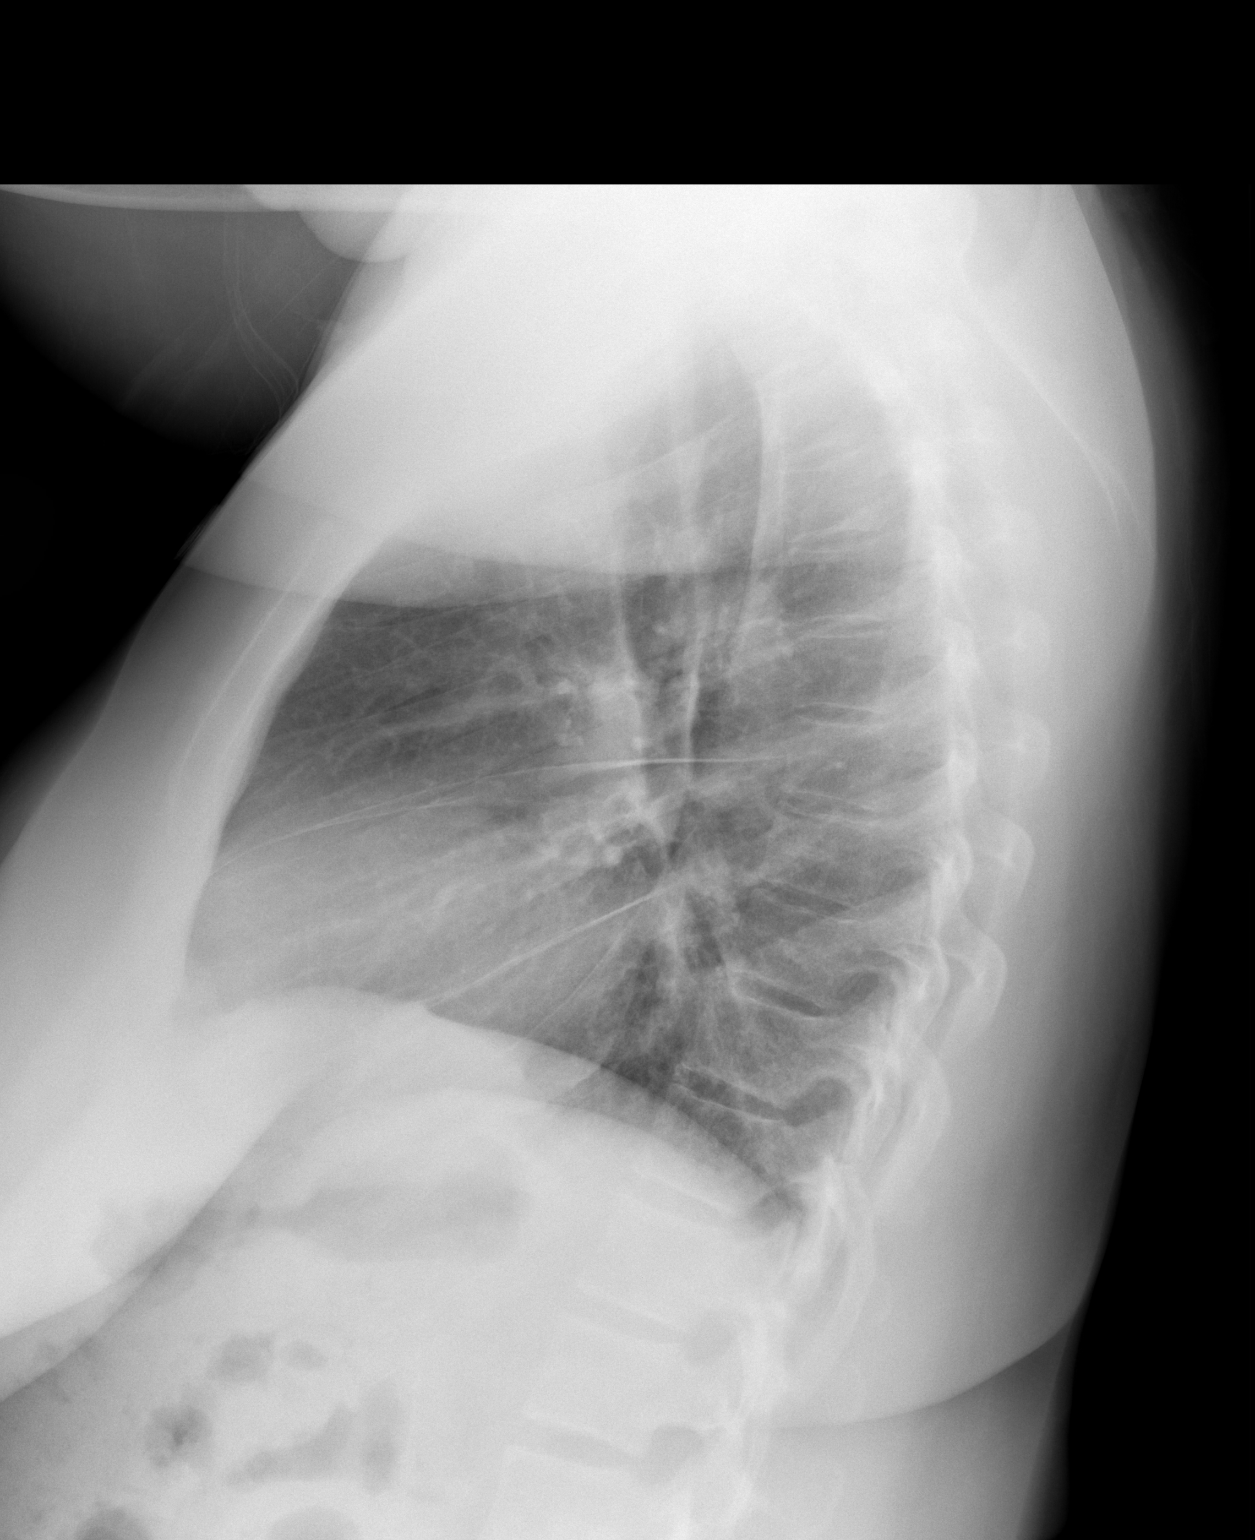

[2 of 2 positions shown; findings below may reference images not displayed]

FINDINGS: The heart size and mediastinal contours are within normal
limits.  Both lungs are clear.  The visualized skeletal structures
are unremarkable.
IMPRESSION: No active disease.

## 2011-04-20 ENCOUNTER — Encounter: Payer: Self-pay | Admitting: Gastroenterology

## 2011-04-20 ENCOUNTER — Ambulatory Visit (INDEPENDENT_AMBULATORY_CARE_PROVIDER_SITE_OTHER): Payer: Medicaid Other | Admitting: Gastroenterology

## 2011-04-20 VITALS — BP 128/60 | HR 74 | Ht 60.0 in | Wt 155.0 lb

## 2011-04-20 DIAGNOSIS — K3184 Gastroparesis: Secondary | ICD-10-CM

## 2011-04-20 DIAGNOSIS — K219 Gastro-esophageal reflux disease without esophagitis: Secondary | ICD-10-CM

## 2011-04-20 NOTE — Patient Instructions (Addendum)
Patient advised to avoid spicy, acidic, citrus, chocolate, mints, fruit and fruit juices.  Limit the intake of caffeine, alcohol and Soda.  Don't exercise too soon after eating.  Don't lie down within 3-4 hours of eating.  Elevate the head of your bed. Gastroparesis diet given. Gas prevention diet given. Use Gas-X four times a day as needed.  Follow up with Dr. Drue Second with your diabetes management and gastrointestinal symptoms.  cc: Collene Schlichter, MD

## 2011-04-20 NOTE — Progress Notes (Signed)
History of Present Illness: This is a 45 year old female that I saw as an unassigned hospital consult in July 2012. She was hospitalized with chest pain, hematemesis, pneumonia, diabetes mellitus and anemia. She was found to have erosive and hemorrhagic esophagitis. She also has underlying gastroparesis with poorly controlled diabetes mellitus and a history of polysubstance abuse. She states that she has had improvement in her GI symptoms since discharge and remaining on Prilosec and metoclopramide. She has frequent belching and occasional mild epigastric discomfort. She states she developed nausea and vomiting if she eats large meals. Her blood sugar of poorly controlled and the patient relates it fluctuates between 20 and 500 at various times. She has had a slow weight loss over several months of about 20 pounds which she attributes to decreased food intake.  Current Medications, Allergies, Past Medical History, Past Surgical History, Family History and Social History were reviewed in [employer] record.  Physical Exam: General: Well developed , well nourished, no acute distress Head: Normocephalic and atraumatic Eyes:  sclerae anicteric, EOMI Ears: Normal auditory acuity Mouth: No deformity or lesions Lungs: Clear throughout to auscultation Heart: Regular rate and rhythm; no murmurs, rubs or bruits Abdomen: Soft, non tender and non distended. No masses, hepatosplenomegaly or hernias noted. Normal Bowel sounds Musculoskeletal: Symmetrical with no gross deformities  Pulses:  Normal pulses noted Extremities: No clubbing, cyanosis, edema or deformities noted Neurological: Alert oriented x 4, grossly nonfocal Psychological:  Alert and cooperative. Normal mood and affect  Assessment and Recommendations:  1. GERD with a history of erosive hemorrhagic esophagitis. She will need to remain on a proton pump inhibitor twice daily long-term. Her reflux is undoubtedly exacerbated by  gastroparesis. Continue all standard antireflux measures.  2. Gastroparesis. This is worsened by her poorly controlled diabetes. Continue standard treatment for gastroparesis and most importantly she is advised to have very close followup with her primary physician to help improve management of her diabetes. If her blood sugars are not adequately controlled her gastroparesis will likely remain poorly controlled.  3. Diabetes mellitus. See above.

## 2011-04-28 IMAGING — CR DG CHEST 2V
2 series · 2 of 2 positions shown · non-contrast
Comparison: 09/04/2010

CLINICAL DATA: Difficulty breathing

CHEST - 2 VIEW

[view not recorded (1 of 2)]
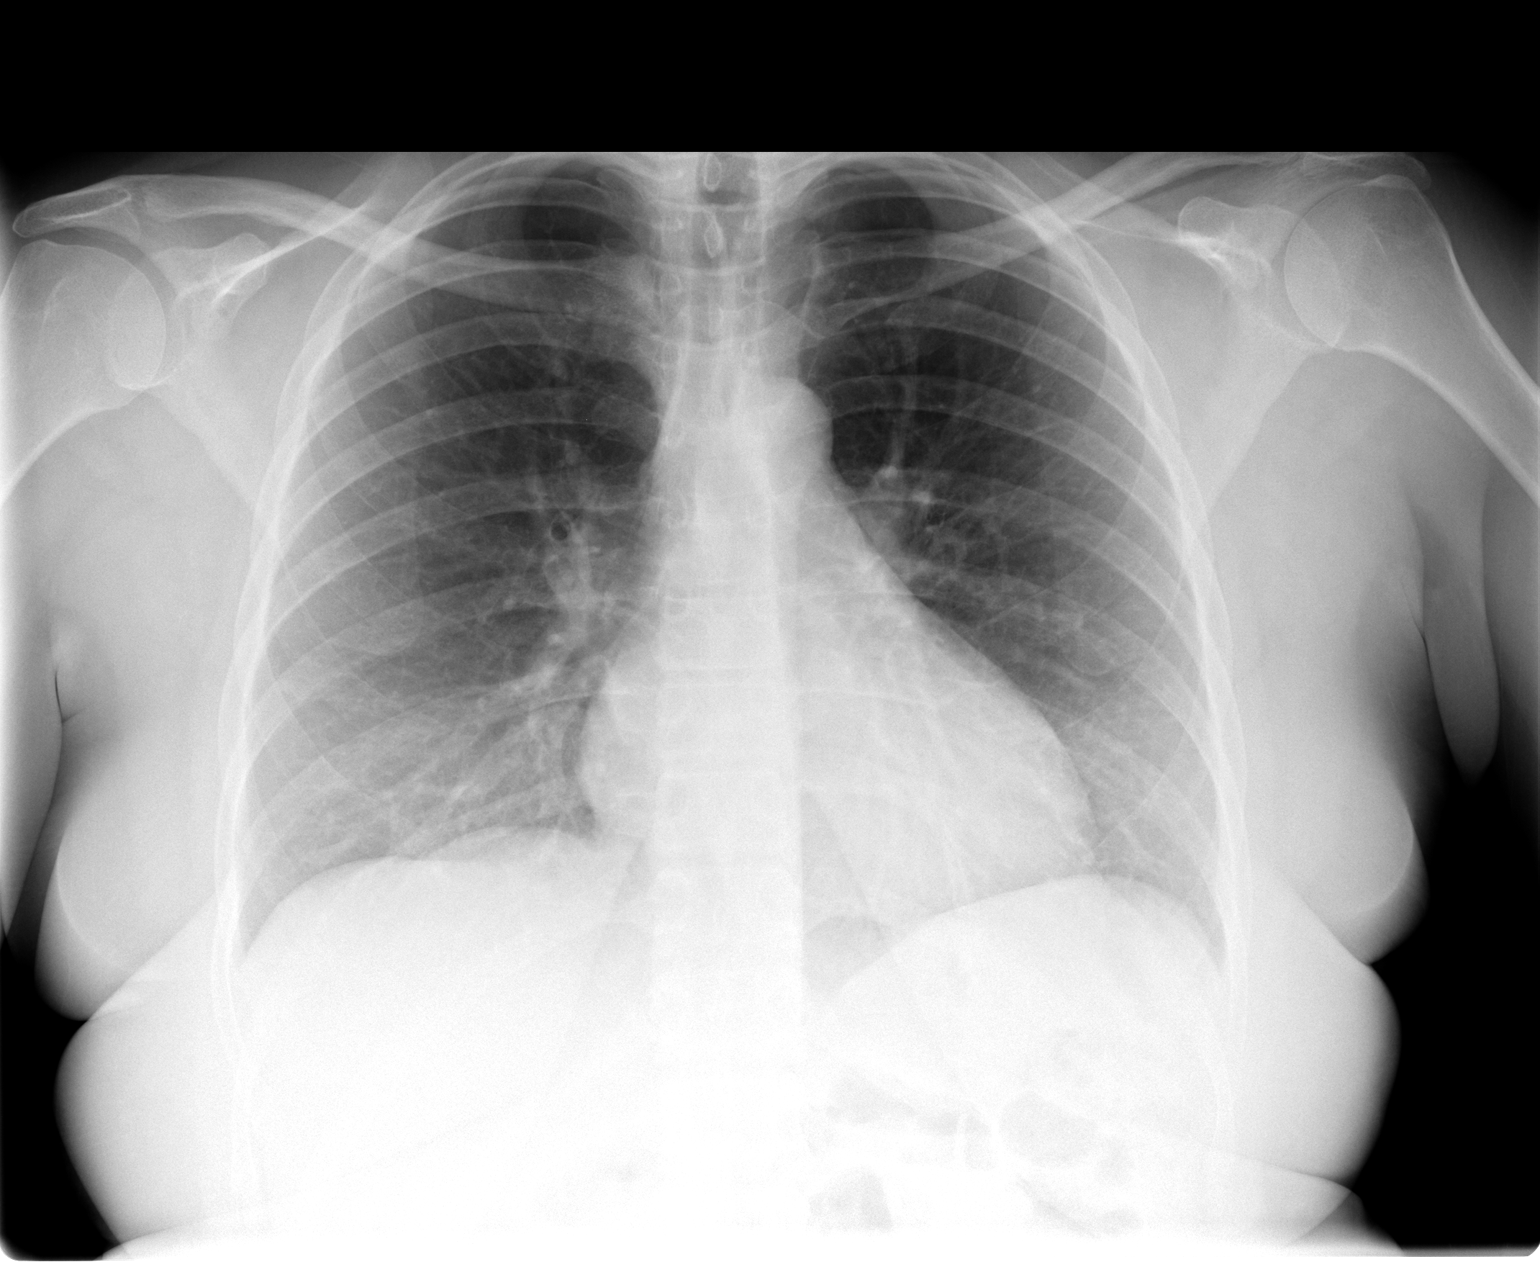

[view not recorded (2 of 2)]
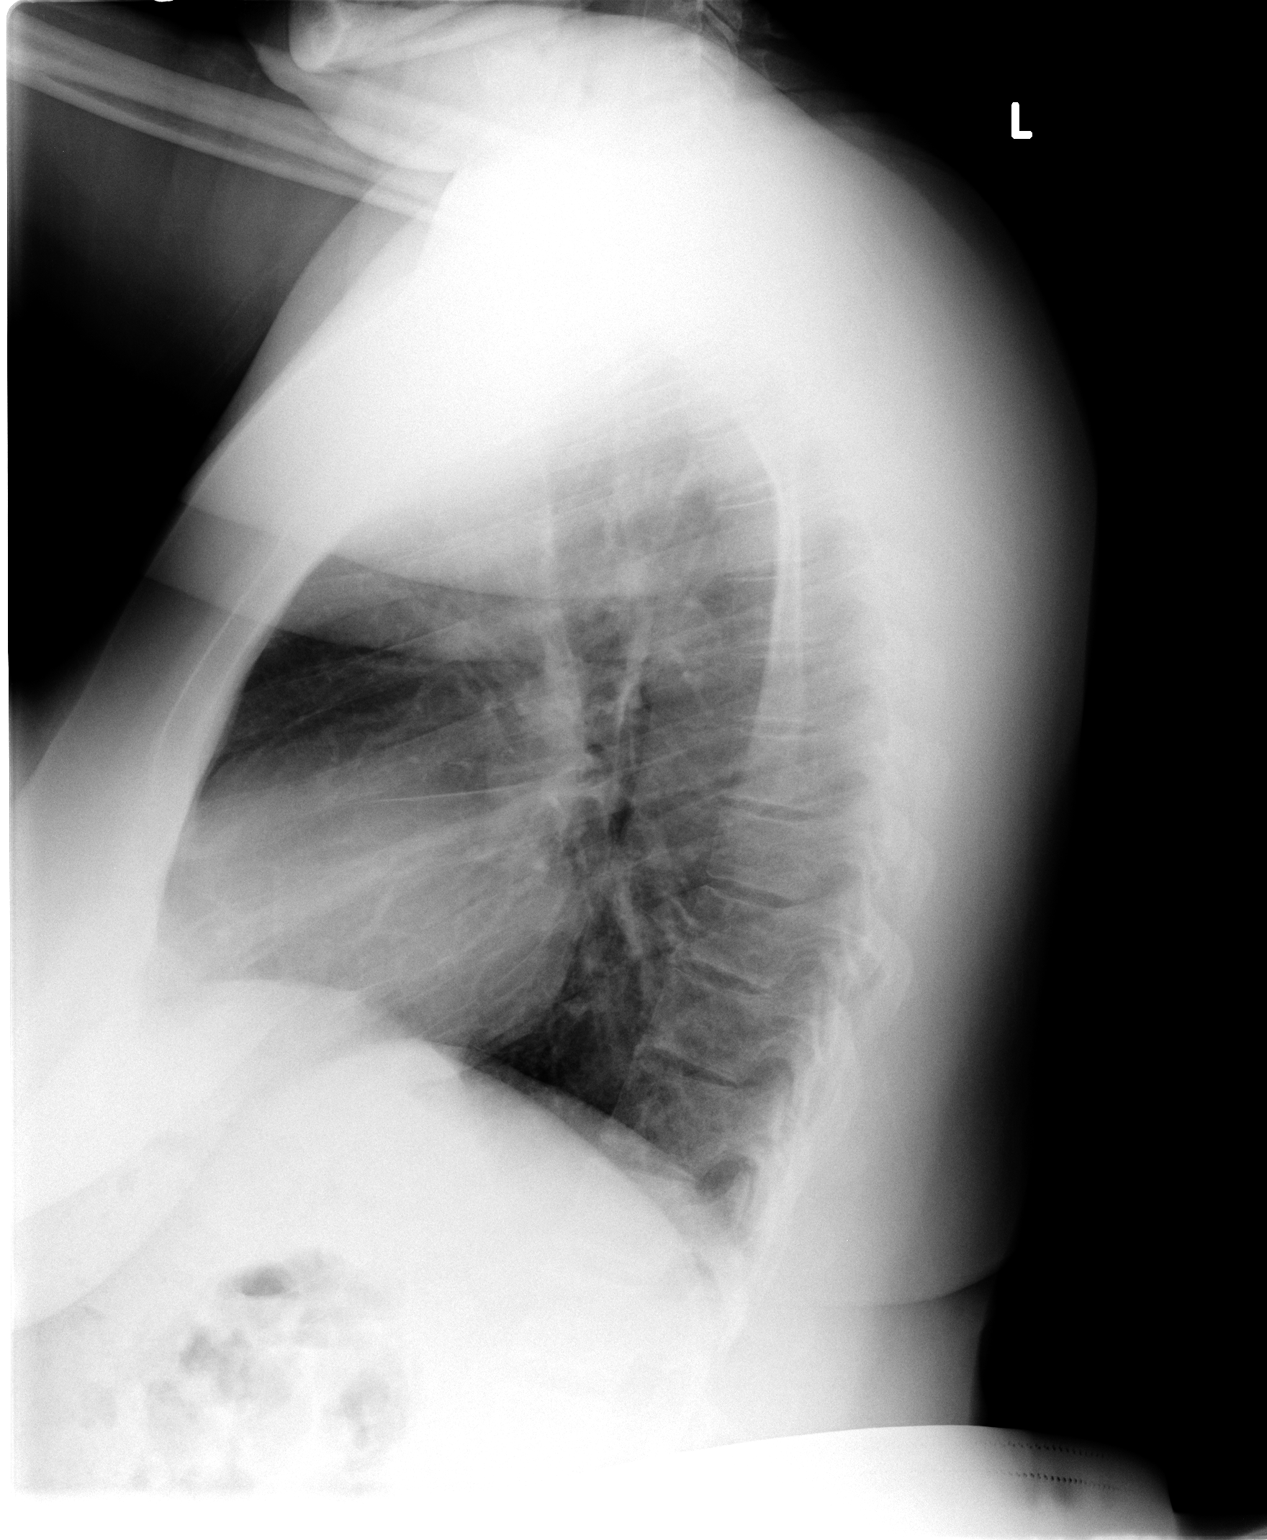

[2 of 2 positions shown; findings below may reference images not displayed]

FINDINGS: Cardiomediastinal silhouette is within normal limits.
Lungs are clear.  No pleural effusion.  Stable thoracic spine.
IMPRESSION: No active cardiopulmonary disease.

## 2011-04-28 IMAGING — CR DG SHOULDER 2+V*L*
3 series · 3 of 3 positions shown · non-contrast
Comparison: None.

CLINICAL DATA: Shoulder pain

LEFT SHOULDER - 2+ VIEW

[view not recorded (1 of 3)]
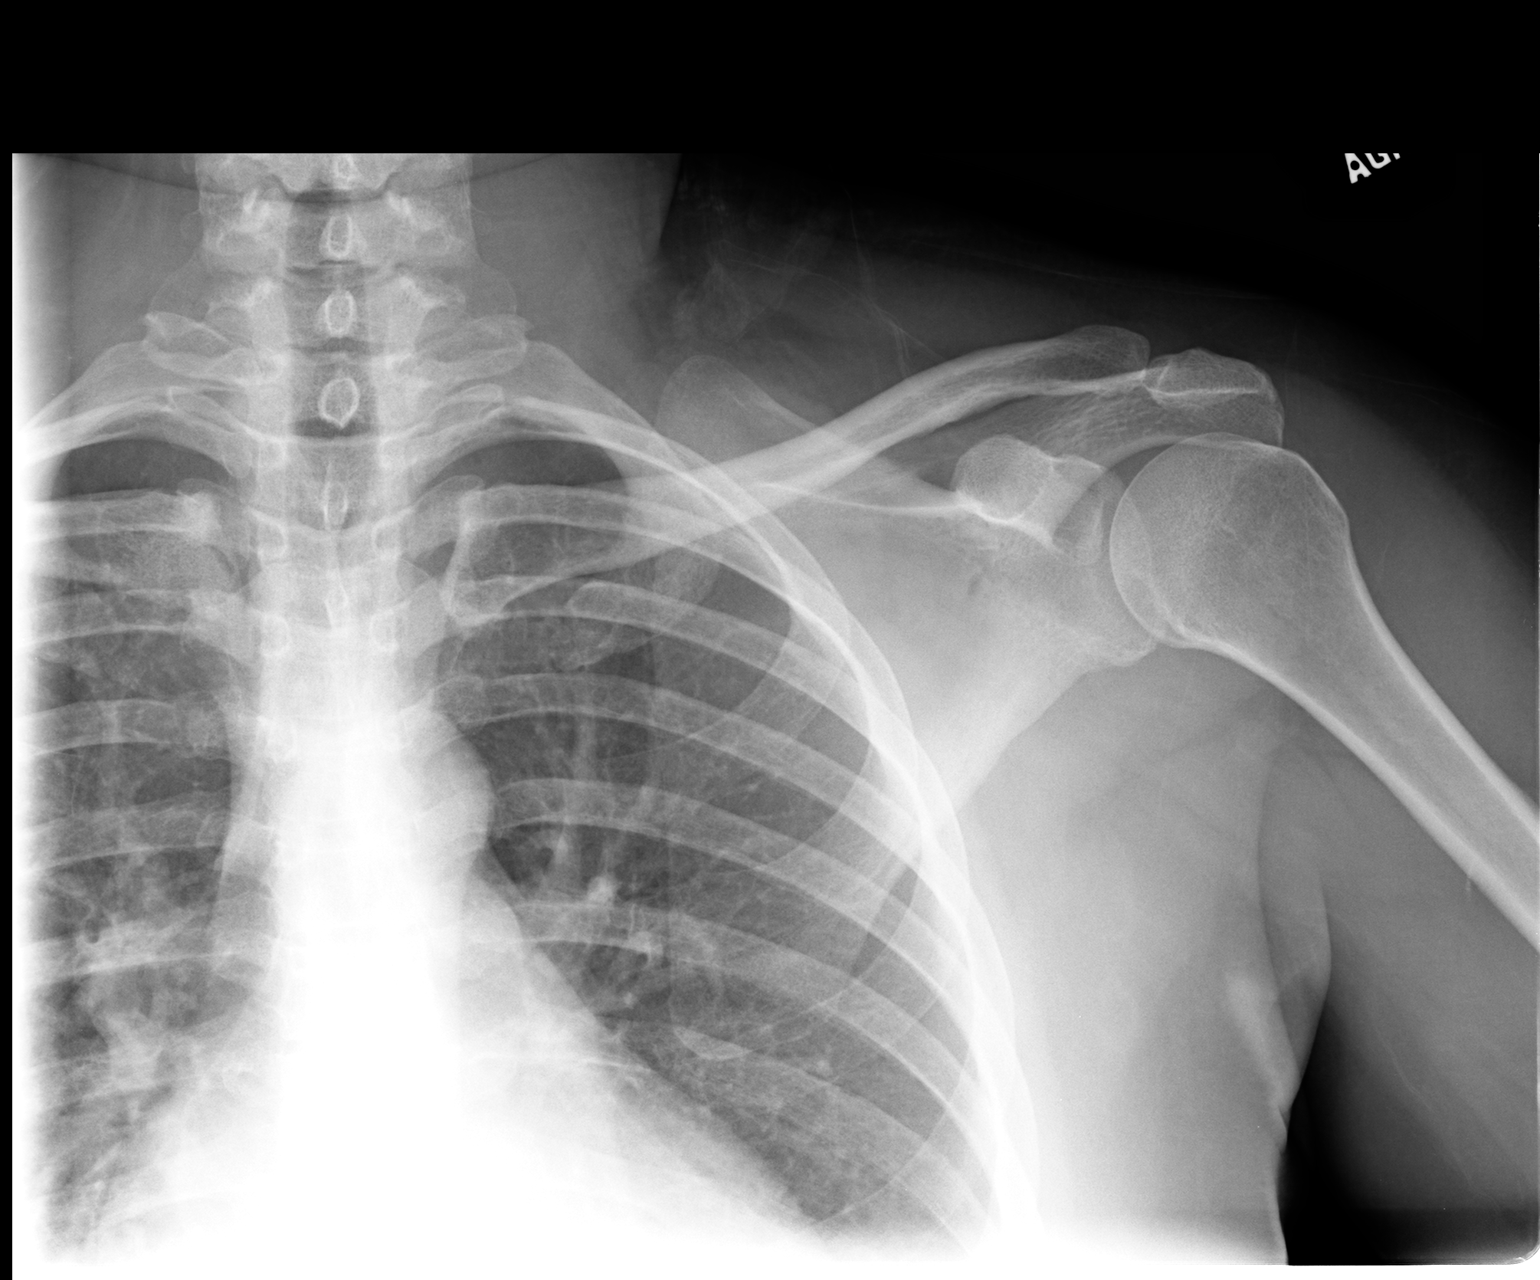

[view not recorded (2 of 3)]
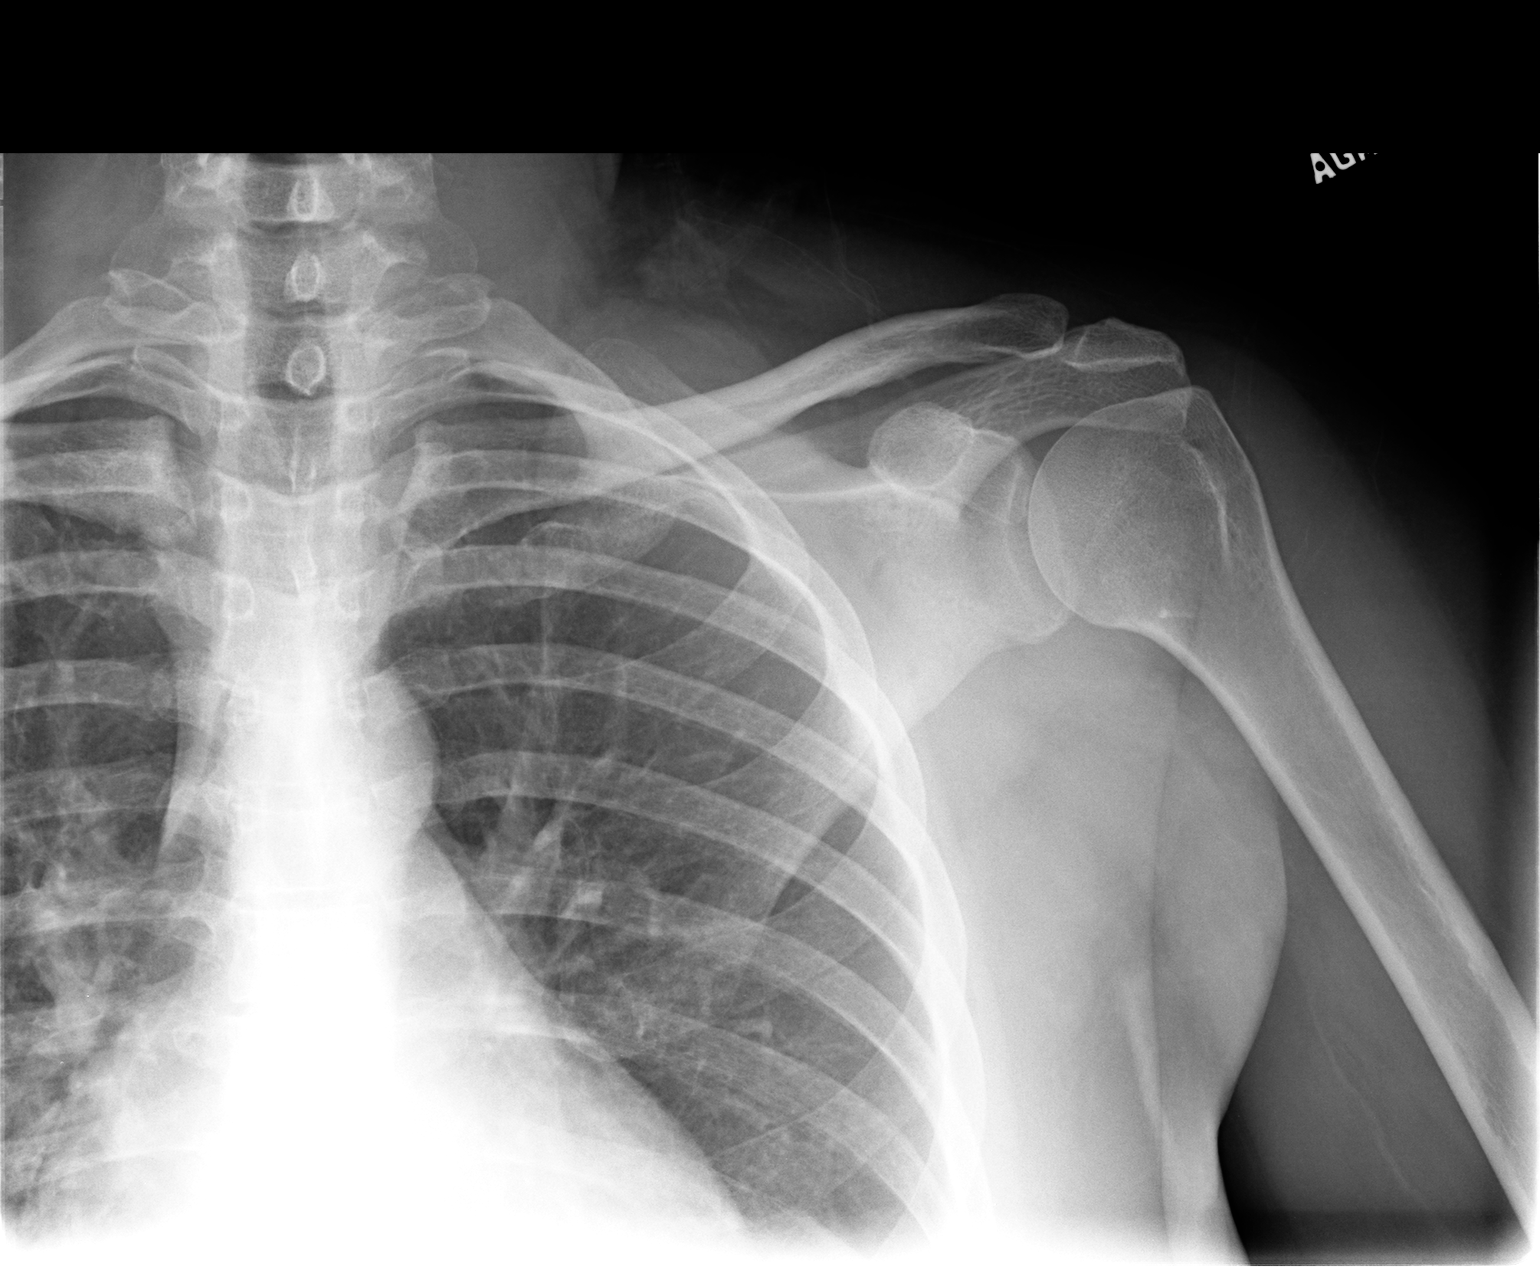

[view not recorded (3 of 3)]
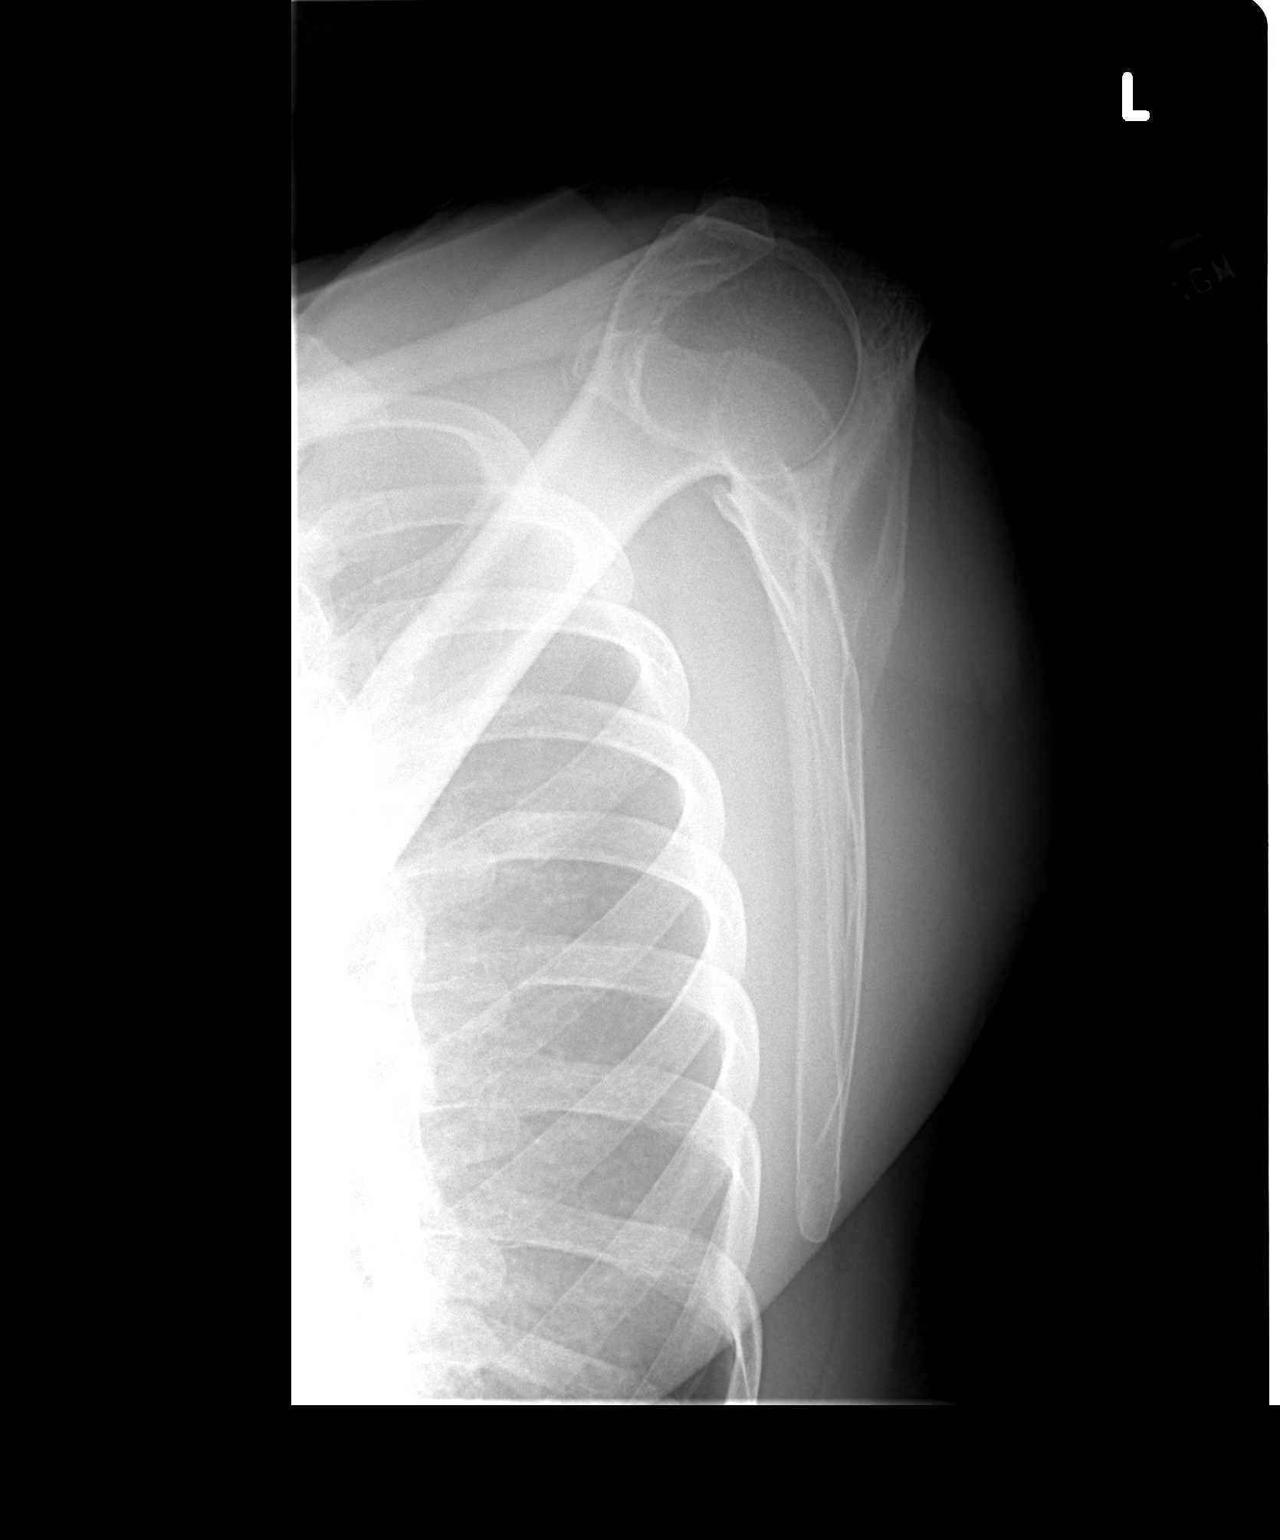

[3 of 3 positions shown; findings below may reference images not displayed]

FINDINGS: No acute fracture.  No dislocation.  Unremarkable soft
tissues.
IMPRESSION: No acute bony pathology.

## 2011-04-30 IMAGING — CR DG CHEST 2V
2 series · 2 of 2 positions shown · non-contrast
Comparison: 09/26/2010

CLINICAL DATA: Abdominal pain.

CHEST - 2 VIEW

[w chest pa]
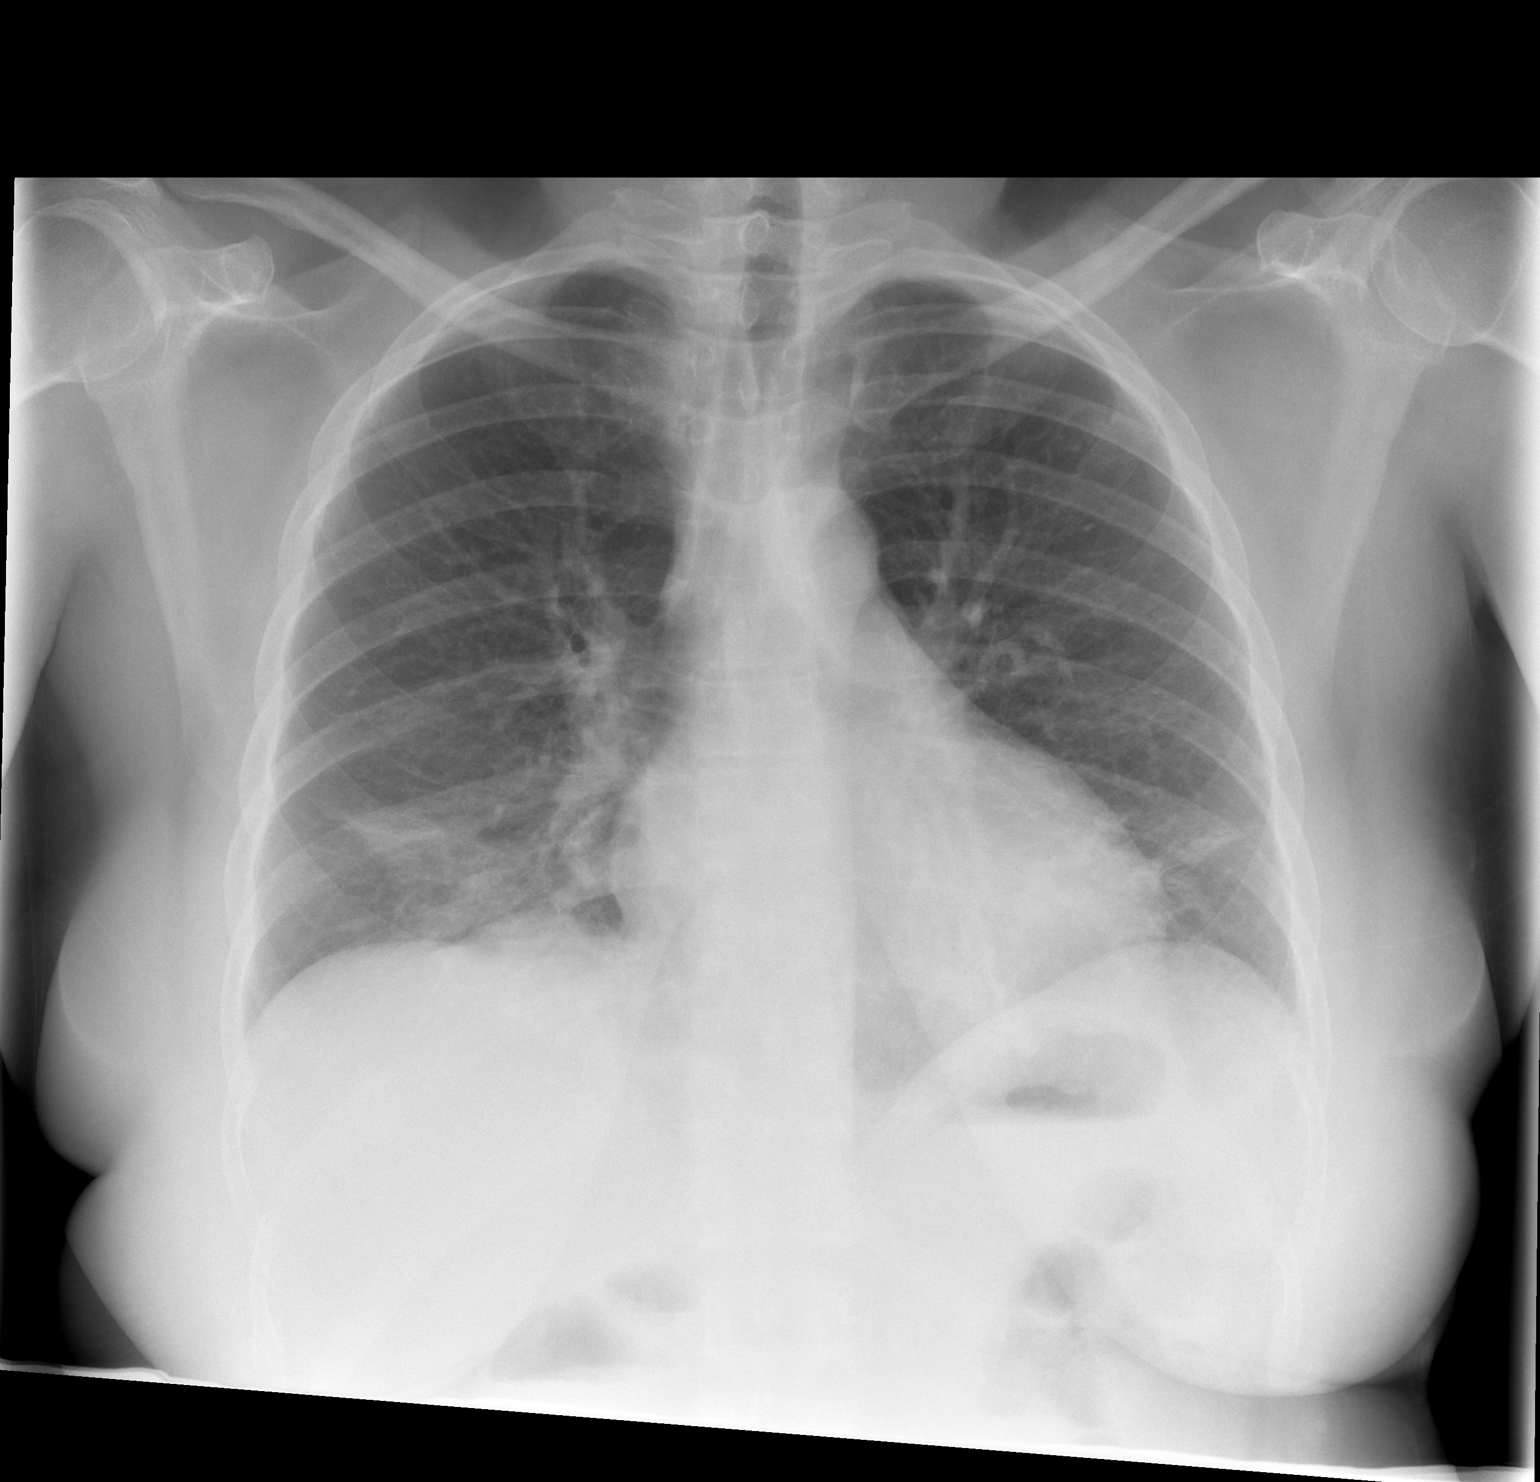

[w chest lat]
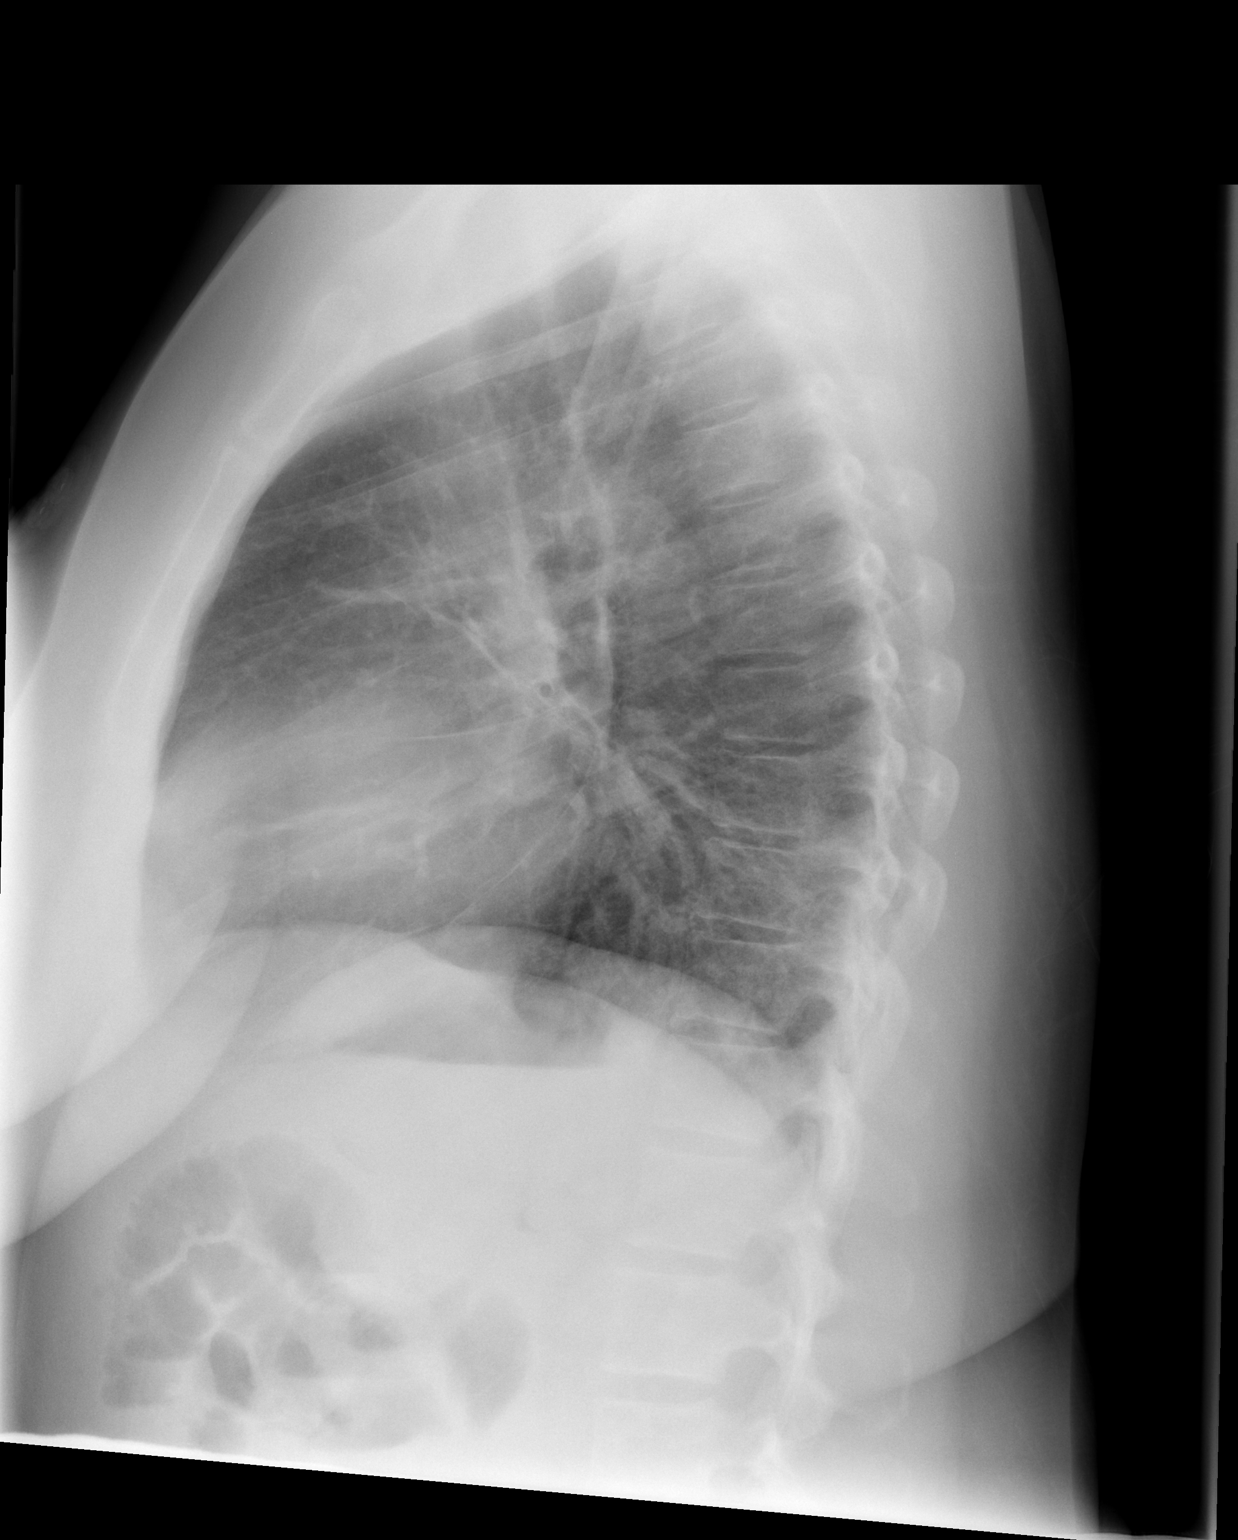

[2 of 2 positions shown; findings below may reference images not displayed]

FINDINGS: Opacities noted in the lower lobes bilaterally.  No
effusions.  There are low lung volumes.  Heart is normal size.  No
acute bony abnormality.
IMPRESSION: Bibasilar opacities, most likely atelectasis.  Recommend clinical
correlation to completely exclude infection.

## 2011-05-04 IMAGING — US US ABDOMEN COMPLETE
1 series · 14 of 25 positions shown · non-contrast
Comparison: CT abdomen 09/28/2010.

CLINICAL DATA: Abdominal pain.

COMPLETE ABDOMINAL ULTRASOUND

[Series 1: us abdomen complete · 0.21mm/px · 14 of 74 slices shown]
[im 1/74]
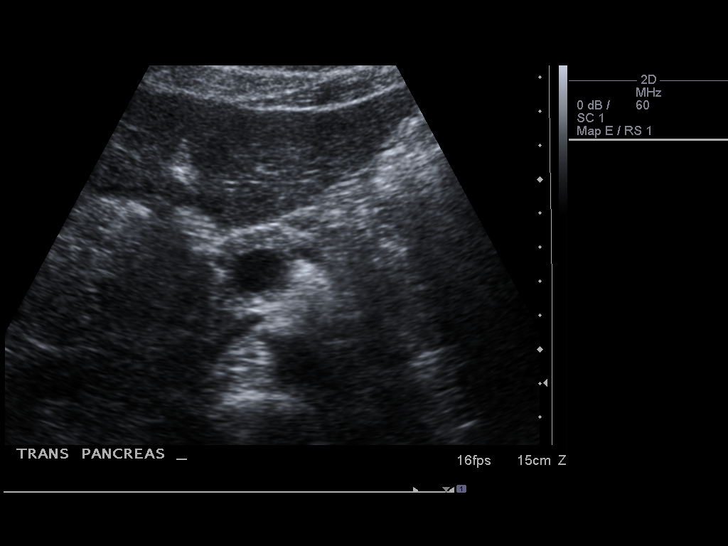
[im 7/74]
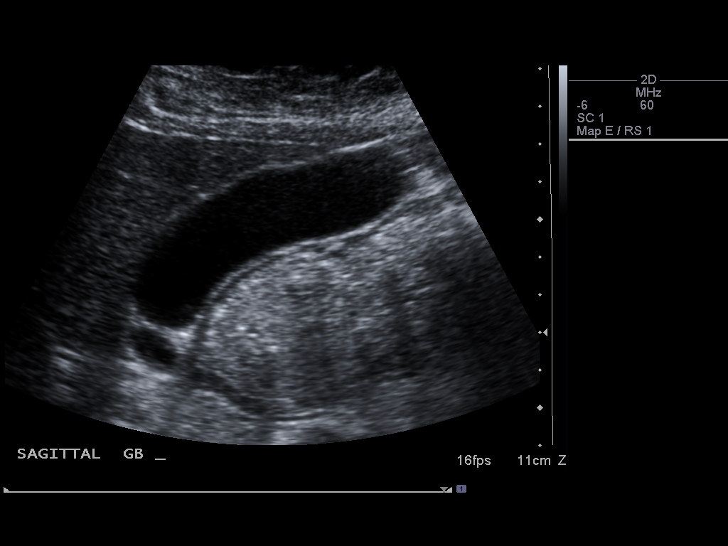
[im 13/74]
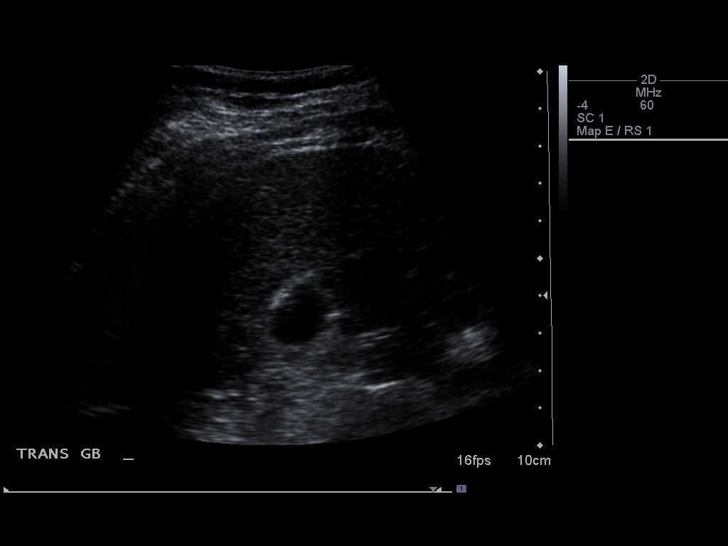
[im 19/74]
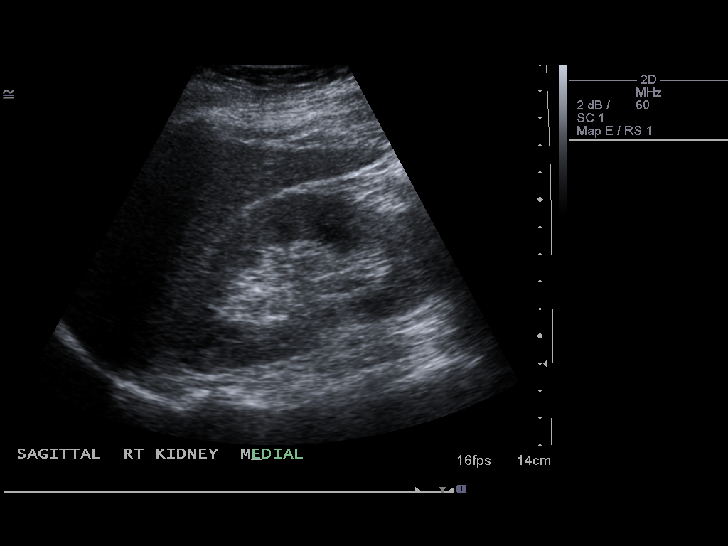
[im 25/74]
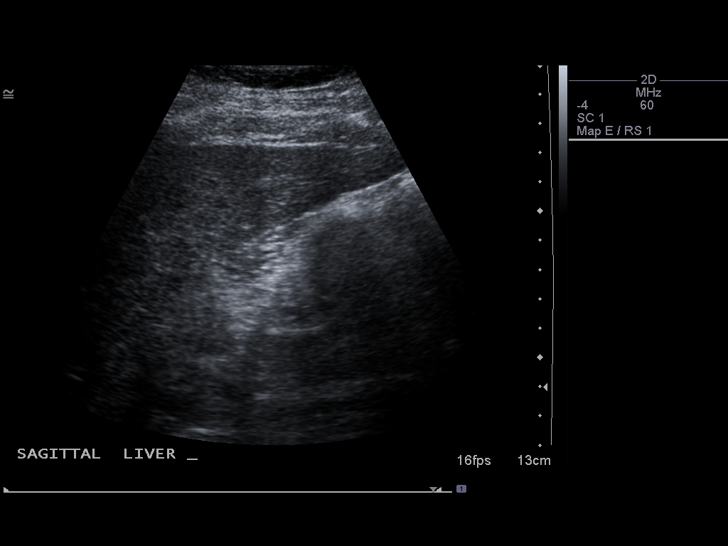
[im 28/74]
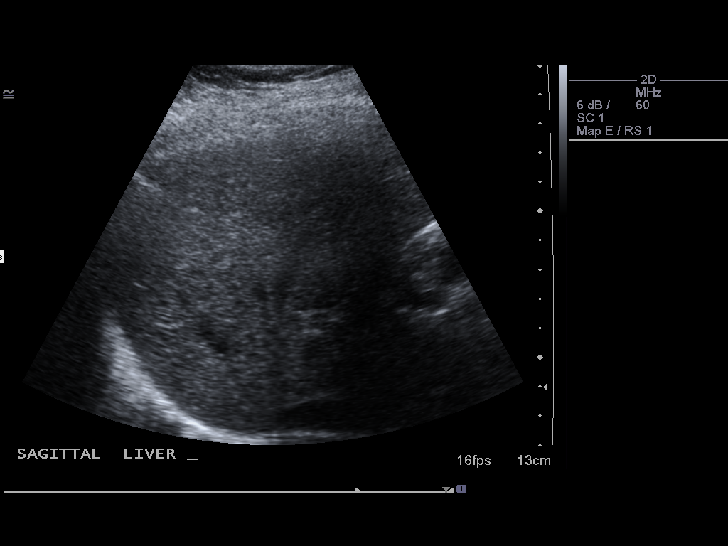
[im 34/74]
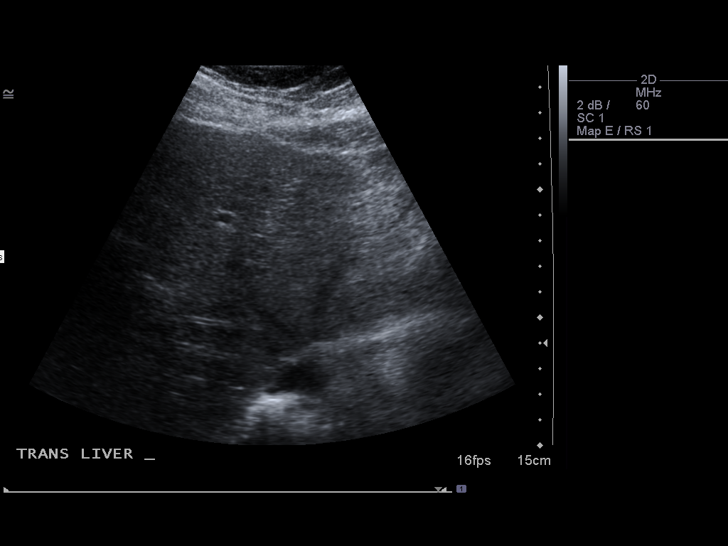
[im 40/74]
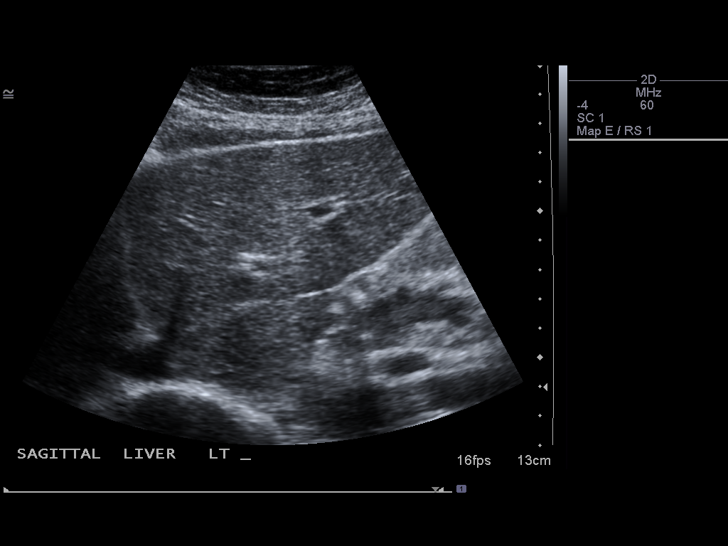
[im 46/74]
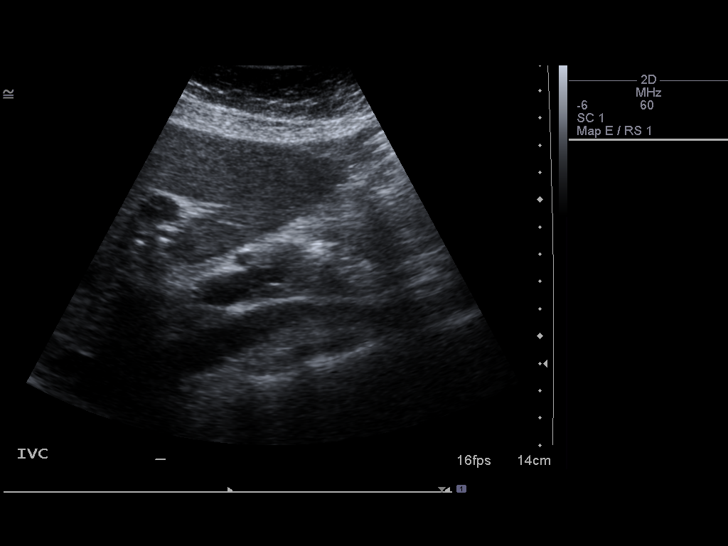
[im 49/74]
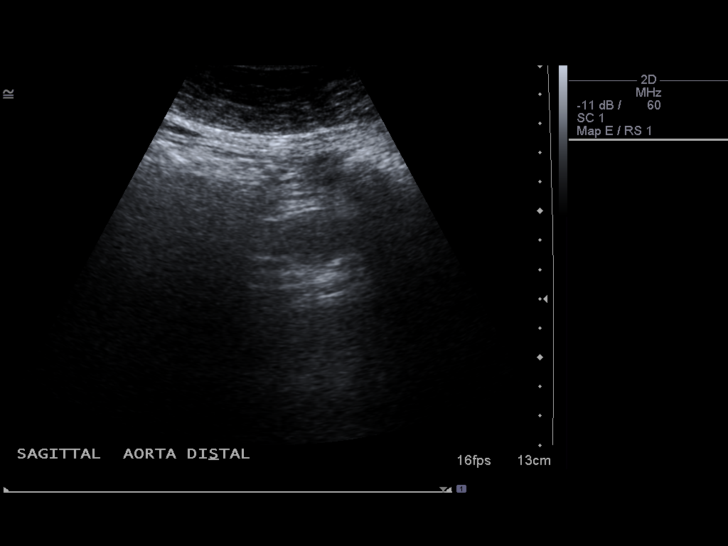
[im 55/74]
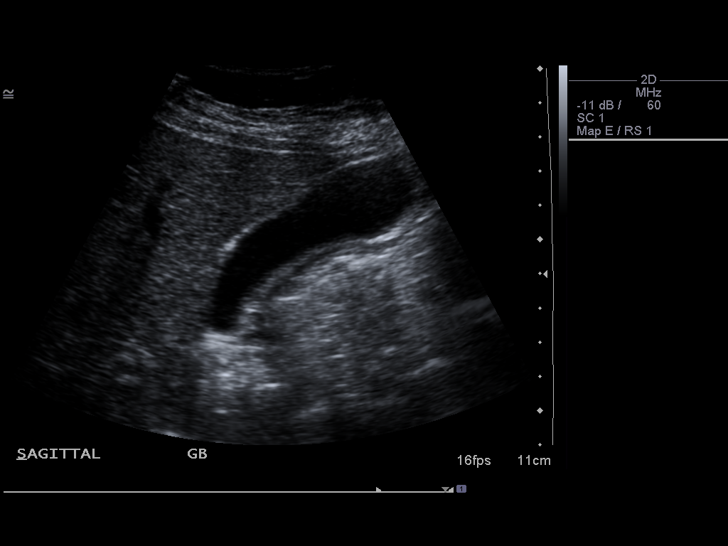
[im 61/74]
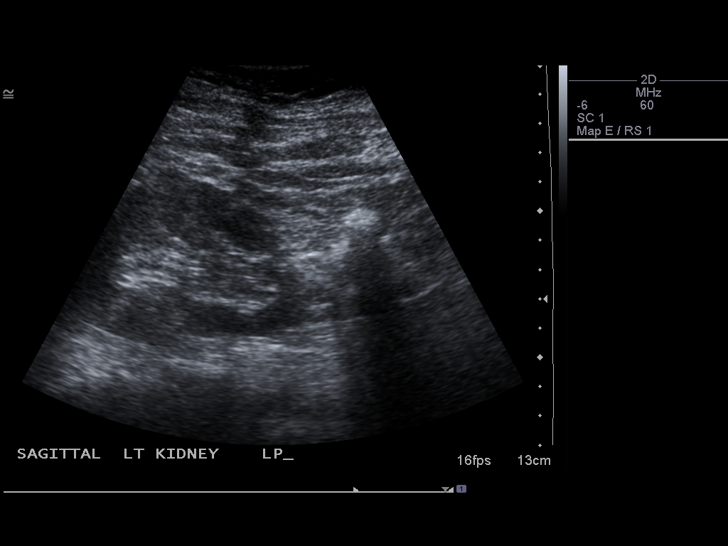
[im 67/74]
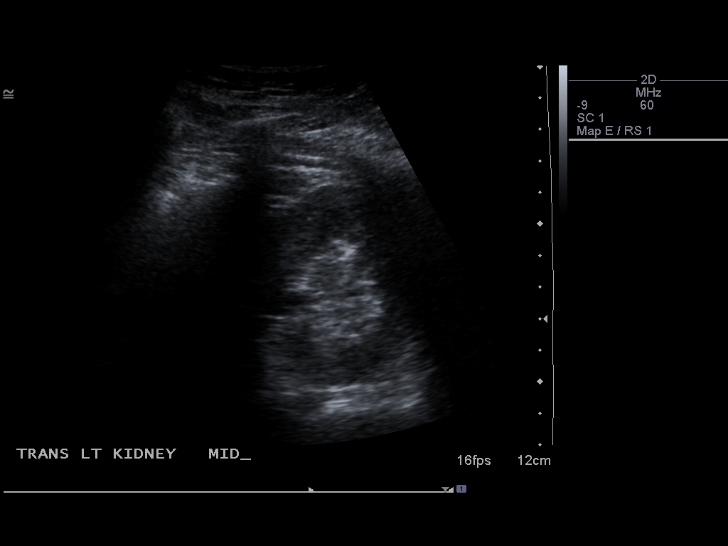
[im 74/74]
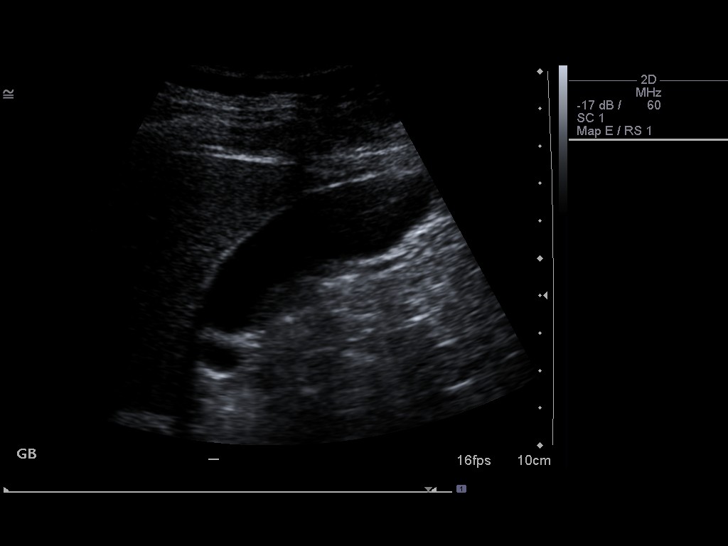

[14 of 25 positions shown; findings below may reference images not displayed]

FINDINGS: Gallbladder:  No gallstones, gallbladder wall thickening, or
pericholecystic fluid.

Common bile duct:  Borderline dilatation in the porta hepatis at
7.7 mm but normal and the head of pancreas.

Liver:  The liver is sonographically unremarkable.  There is normal
echogenicity without focal lesions or intrahepatic biliary
dilatation.

IVC:  Normal caliber.

Pancreas:  Sonographically normal.

Spleen:  Normal in size and echogenicity without focal lesions.

Right Kidney:  10.6 cm in length. Normal renal cortical thickness
and echogenicity without focal lesions or hydronephrosis.

Left Kidney:  9.2 cm in length. Normal renal cortical thickness and
echogenicity without focal lesions or hydronephrosis.

Abdominal aorta:  Normal caliber.  Distal atherosclerotic changes.
IMPRESSION: 1.  Normal sonographic appearance of the gallbladder and upper
limits of normal common bile duct.
2.  Normal sonographic appearance of the liver, spleen, pancreas
and both kidneys.

## 2011-05-09 LAB — GC/CHLAMYDIA PROBE AMP, GENITAL
Chlamydia, DNA Probe: NEGATIVE
GC Probe Amp, Genital: NEGATIVE

## 2011-05-09 LAB — SYNOVIAL CELL COUNT + DIFF, W/ CRYSTALS
Crystals, Fluid: NONE SEEN
Eosinophils-Synovial: 0
Lymphocytes-Synovial Fld: 0
Monocyte-Macrophage-Synovial Fluid: 8 — ABNORMAL LOW
Neutrophil, Synovial: 92 — ABNORMAL HIGH
WBC, Synovial: 11500 — ABNORMAL HIGH

## 2011-05-09 LAB — BODY FLUID CULTURE: Culture: NO GROWTH

## 2011-05-09 LAB — URIC ACID: Uric Acid, Serum: 4.3

## 2011-05-09 LAB — GRAM STAIN

## 2011-05-15 ENCOUNTER — Emergency Department (HOSPITAL_COMMUNITY)
Admission: EM | Admit: 2011-05-15 | Discharge: 2011-05-16 | Disposition: A | Discharge status: Home / Self Care | Payer: Medicaid Other | Attending: Emergency Medicine | Admitting: Emergency Medicine

## 2011-05-15 ENCOUNTER — Inpatient Hospital Stay (HOSPITAL_COMMUNITY)
Admission: RE | Admit: 2011-05-15 | Discharge: 2011-05-15 | Disposition: A | Discharge status: Home / Self Care | Payer: Medicaid Other | Source: Ambulatory Visit | Attending: Family Medicine | Admitting: Family Medicine

## 2011-05-15 DIAGNOSIS — J029 Acute pharyngitis, unspecified: Secondary | ICD-10-CM | POA: Insufficient documentation

## 2011-05-15 DIAGNOSIS — Z794 Long term (current) use of insulin: Secondary | ICD-10-CM | POA: Insufficient documentation

## 2011-05-15 DIAGNOSIS — F341 Dysthymic disorder: Secondary | ICD-10-CM | POA: Insufficient documentation

## 2011-05-15 DIAGNOSIS — M542 Cervicalgia: Secondary | ICD-10-CM | POA: Insufficient documentation

## 2011-05-15 DIAGNOSIS — M129 Arthropathy, unspecified: Secondary | ICD-10-CM | POA: Insufficient documentation

## 2011-05-15 DIAGNOSIS — I252 Old myocardial infarction: Secondary | ICD-10-CM | POA: Insufficient documentation

## 2011-05-15 DIAGNOSIS — Z79899 Other long term (current) drug therapy: Secondary | ICD-10-CM | POA: Insufficient documentation

## 2011-05-15 DIAGNOSIS — I2510 Atherosclerotic heart disease of native coronary artery without angina pectoris: Secondary | ICD-10-CM | POA: Insufficient documentation

## 2011-05-15 DIAGNOSIS — E119 Type 2 diabetes mellitus without complications: Secondary | ICD-10-CM | POA: Insufficient documentation

## 2011-05-15 DIAGNOSIS — R599 Enlarged lymph nodes, unspecified: Secondary | ICD-10-CM | POA: Insufficient documentation

## 2011-05-15 LAB — RAPID STREP SCREEN (MED CTR MEBANE ONLY): Streptococcus, Group A Screen (Direct): NEGATIVE

## 2011-05-16 LAB — CK: Total CK: 34

## 2011-05-16 LAB — CBC
HCT: 29.9 — ABNORMAL LOW
HCT: 30.1 — ABNORMAL LOW
Hemoglobin: 9.9 — ABNORMAL LOW
Hemoglobin: 9.9 — ABNORMAL LOW
MCHC: 33
MCV: 83.3
MCV: 84
Platelets: 375
RBC: 3.56 — ABNORMAL LOW
RDW: 14.9 — ABNORMAL HIGH
RDW: 15.2 — ABNORMAL HIGH
WBC: 10.5

## 2011-05-16 LAB — CSF CELL COUNT WITH DIFFERENTIAL: Tube #: 3

## 2011-05-16 LAB — RENAL FUNCTION PANEL
Albumin: 2.5 — ABNORMAL LOW
CO2: 24
Chloride: 100
GFR calc Af Amer: >60
GFR calc non Af Amer: >60
Potassium: 4.6
Sodium: 131 — ABNORMAL LOW

## 2011-05-16 LAB — URINALYSIS, ROUTINE W REFLEX MICROSCOPIC
Glucose, UA: >1000 — AB
Ketones, ur: NEGATIVE
Leukocytes, UA: NEGATIVE
Nitrite: NEGATIVE
Protein, ur: 30 — AB
pH: 6.5

## 2011-05-16 LAB — COMPREHENSIVE METABOLIC PANEL
AST: 27
Albumin: 3 — ABNORMAL LOW
Alkaline Phosphatase: 107
Chloride: 101
GFR calc Af Amer: >60
Potassium: 4.7
Total Bilirubin: 0.4

## 2011-05-16 LAB — BASIC METABOLIC PANEL
CO2: 25
Chloride: 103
Chloride: 104
GFR calc Af Amer: >60
GFR calc non Af Amer: >60
Glucose, Bld: 296 — ABNORMAL HIGH
Glucose, Bld: 338 — ABNORMAL HIGH
Potassium: 4.5
Sodium: 133 — ABNORMAL LOW
Sodium: 133 — ABNORMAL LOW

## 2011-05-16 LAB — OLIGOCLONAL BANDS, CSF + SERM
Albumin Index: 5 ratio (ref 0.0–9.0)
Albumin, CSF: 15 mg/dL (ref 0–35)
Albumin, Serum(Neph): 2980 mg/dL — ABNORMAL LOW (ref 3500–5200)
CSF Oligoclonal Bands: NEGATIVE
IgG, CSF: 3.7 mg/dL (ref 0.0–6.0)
IgG, Serum: 1400 mg/dL (ref 768–1632)

## 2011-05-16 LAB — DIFFERENTIAL
Eosinophils Absolute: 0.2
Lymphs Abs: 1.7
Neutro Abs: 7.4

## 2011-05-16 LAB — POCT CARDIAC MARKERS
Myoglobin, poc: 66.2
Operator id: 192351
Troponin i, poc: <0.05

## 2011-05-16 LAB — CULTURE, BLOOD (ROUTINE X 2): Culture: NO GROWTH

## 2011-05-16 LAB — LIPID PANEL
Cholesterol: 207 — ABNORMAL HIGH
HDL: 84
LDL Cholesterol: 107 — ABNORMAL HIGH
Total CHOL/HDL Ratio: 2.5
Triglycerides: 82
VLDL: 16

## 2011-05-16 LAB — PROTEIN, CSF: Total  Protein, CSF: 28

## 2011-05-16 LAB — URINE MICROSCOPIC-ADD ON

## 2011-05-16 LAB — SEDIMENTATION RATE: Sed Rate: 50 — ABNORMAL HIGH

## 2011-05-16 LAB — PROTIME-INR: INR: 1

## 2011-05-18 LAB — POCT I-STAT CREATININE: Creatinine, Ser: 1.3 — ABNORMAL HIGH

## 2011-05-18 LAB — I-STAT 8, (EC8 V) (CONVERTED LAB)
Chloride: 101
Glucose, Bld: 562
Potassium: 4.4
pCO2, Ven: 47.4
pH, Ven: 7.308 — ABNORMAL HIGH

## 2011-06-27 ENCOUNTER — Encounter (HOSPITAL_COMMUNITY): Payer: Self-pay | Admitting: Pharmacy Technician

## 2011-07-01 DIAGNOSIS — I2510 Atherosclerotic heart disease of native coronary artery without angina pectoris: Secondary | ICD-10-CM

## 2011-07-01 HISTORY — DX: Atherosclerotic heart disease of native coronary artery without angina pectoris: I25.10

## 2011-07-04 ENCOUNTER — Other Ambulatory Visit: Payer: Self-pay | Admitting: Interventional Cardiology

## 2011-07-06 ENCOUNTER — Other Ambulatory Visit: Payer: Self-pay

## 2011-07-06 ENCOUNTER — Ambulatory Visit (HOSPITAL_COMMUNITY)
Admission: RE | Admit: 2011-07-06 | Discharge: 2011-07-07 | Disposition: A | Discharge status: Home / Self Care | Payer: Medicaid Other | Source: Ambulatory Visit | Attending: Interventional Cardiology | Admitting: Interventional Cardiology

## 2011-07-06 ENCOUNTER — Encounter (HOSPITAL_COMMUNITY)
Admission: RE | Disposition: A | Discharge status: Home / Self Care | Payer: Self-pay | Source: Ambulatory Visit | Attending: Interventional Cardiology

## 2011-07-06 DIAGNOSIS — T82897A Other specified complication of cardiac prosthetic devices, implants and grafts, initial encounter: Secondary | ICD-10-CM | POA: Insufficient documentation

## 2011-07-06 DIAGNOSIS — Y831 Surgical operation with implant of artificial internal device as the cause of abnormal reaction of the patient, or of later complication, without mention of misadventure at the time of the procedure: Secondary | ICD-10-CM | POA: Insufficient documentation

## 2011-07-06 DIAGNOSIS — I2510 Atherosclerotic heart disease of native coronary artery without angina pectoris: Secondary | ICD-10-CM | POA: Insufficient documentation

## 2011-07-06 DIAGNOSIS — Z01810 Encounter for preprocedural cardiovascular examination: Secondary | ICD-10-CM | POA: Insufficient documentation

## 2011-07-06 DIAGNOSIS — E119 Type 2 diabetes mellitus without complications: Secondary | ICD-10-CM | POA: Insufficient documentation

## 2011-07-06 DIAGNOSIS — I209 Angina pectoris, unspecified: Secondary | ICD-10-CM | POA: Insufficient documentation

## 2011-07-06 HISTORY — PX: LEFT HEART CATHETERIZATION WITH CORONARY ANGIOGRAM: SHX5451

## 2011-07-06 HISTORY — PX: PERCUTANEOUS CORONARY STENT INTERVENTION (PCI-S): SHX5485

## 2011-07-06 LAB — PLATELET INHIBITION P2Y12: Platelet Function  P2Y12: 148 [PRU] — ABNORMAL LOW (ref 194–418)

## 2011-07-06 LAB — GLUCOSE, CAPILLARY

## 2011-07-06 SURGERY — LEFT HEART CATHETERIZATION WITH CORONARY ANGIOGRAM
Anesthesia: LOCAL

## 2011-07-06 SURGERY — PERCUTANEOUS CORONARY STENT INTERVENTION (PCI-S)
Anesthesia: LOCAL

## 2011-07-06 MED ORDER — LISINOPRIL 5 MG PO TABS
5.0000 mg | ORAL_TABLET | Freq: Every day | ORAL | Status: DC
  Filled 2011-07-06 (×2): qty 1

## 2011-07-06 MED ORDER — SERTRALINE HCL 100 MG PO TABS
100.0000 mg | ORAL_TABLET | Freq: Every day | ORAL | Status: DC
  Administered 2011-07-06 – 2011-07-07 (×2): 100 mg via ORAL
  Filled 2011-07-06 (×2): qty 1

## 2011-07-06 MED ORDER — INSULIN GLARGINE 100 UNIT/ML ~~LOC~~ SOLN
45.0000 [IU] | SUBCUTANEOUS | Status: DC
  Administered 2011-07-07: 45 [IU] via SUBCUTANEOUS
  Filled 2011-07-06: qty 3

## 2011-07-06 MED ORDER — ACETAMINOPHEN 325 MG PO TABS: 650.0000 mg | ORAL_TABLET | ORAL | Status: DC | PRN

## 2011-07-06 MED ORDER — BIVALIRUDIN 250 MG IV SOLR
INTRAVENOUS | Status: AC
  Filled 2011-07-06: qty 250

## 2011-07-06 MED ORDER — SODIUM CHLORIDE 0.9 % IJ SOLN: 3.0000 mL | Freq: Two times a day (BID) | INTRAMUSCULAR | Status: DC

## 2011-07-06 MED ORDER — METHOTREXATE 2.5 MG PO TABS: 2.5000 mg | ORAL_TABLET | Freq: Once | ORAL | Status: DC

## 2011-07-06 MED ORDER — HEPARIN (PORCINE) IN NACL 2-0.9 UNIT/ML-% IJ SOLN
INTRAMUSCULAR | Status: AC
  Filled 2011-07-06: qty 2000

## 2011-07-06 MED ORDER — FENTANYL CITRATE 0.05 MG/ML IJ SOLN
INTRAMUSCULAR | Status: AC
  Filled 2011-07-06: qty 2

## 2011-07-06 MED ORDER — CLOPIDOGREL BISULFATE 300 MG PO TABS
ORAL_TABLET | ORAL | Status: AC
  Filled 2011-07-06: qty 1

## 2011-07-06 MED ORDER — SODIUM CHLORIDE 0.9 % IV SOLN: INTRAVENOUS | Status: DC

## 2011-07-06 MED ORDER — INSULIN ASPART 100 UNIT/ML ~~LOC~~ SOLN
0.0000 [IU] | Freq: Three times a day (TID) | SUBCUTANEOUS | Status: DC
  Administered 2011-07-06: 9 [IU] via SUBCUTANEOUS
  Administered 2011-07-07: 3 [IU] via SUBCUTANEOUS
  Filled 2011-07-06: qty 3

## 2011-07-06 MED ORDER — METOCLOPRAMIDE HCL 10 MG PO TABS
10.0000 mg | ORAL_TABLET | Freq: Four times a day (QID) | ORAL | Status: DC
  Administered 2011-07-06 – 2011-07-07 (×2): 10 mg via ORAL
  Filled 2011-07-06 (×5): qty 1

## 2011-07-06 MED ORDER — SODIUM CHLORIDE 0.9 % IV SOLN
1.0000 mL/kg/h | INTRAVENOUS | Status: AC
  Administered 2011-07-07: 1 mL/kg/h via INTRAVENOUS

## 2011-07-06 MED ORDER — MIDAZOLAM HCL 2 MG/2ML IJ SOLN
INTRAMUSCULAR | Status: AC
  Filled 2011-07-06: qty 2

## 2011-07-06 MED ORDER — METHOTREXATE 2.5 MG PO TABS
15.0000 mg | ORAL_TABLET | Freq: Once | ORAL | Status: AC
  Administered 2011-07-06: 15 mg via ORAL
  Filled 2011-07-06: qty 6

## 2011-07-06 MED ORDER — DIAZEPAM 5 MG PO TABS
5.0000 mg | ORAL_TABLET | ORAL | Status: AC
  Administered 2011-07-06: 5 mg via ORAL
  Filled 2011-07-06: qty 1

## 2011-07-06 MED ORDER — NITROGLYCERIN IN D5W 200-5 MCG/ML-% IV SOLN
INTRAVENOUS | Status: AC
  Filled 2011-07-06: qty 250

## 2011-07-06 MED ORDER — TRAMADOL HCL 50 MG PO TABS
50.0000 mg | ORAL_TABLET | Freq: Four times a day (QID) | ORAL | Status: DC | PRN
  Administered 2011-07-06 (×2): 50 mg via ORAL
  Filled 2011-07-06 (×5): qty 1

## 2011-07-06 MED ORDER — NITROGLYCERIN 0.4 MG SL SUBL: 0.4000 mg | SUBLINGUAL_TABLET | SUBLINGUAL | Status: DC | PRN

## 2011-07-06 MED ORDER — PANTOPRAZOLE SODIUM 40 MG PO TBEC
40.0000 mg | DELAYED_RELEASE_TABLET | Freq: Every day | ORAL | Status: DC
  Administered 2011-07-07 (×2): 40 mg via ORAL
  Filled 2011-07-06 (×2): qty 1

## 2011-07-06 MED ORDER — SODIUM CHLORIDE 0.9 % IJ SOLN: 3.0000 mL | INTRAMUSCULAR | Status: DC | PRN

## 2011-07-06 MED ORDER — LIDOCAINE HCL (PF) 1 % IJ SOLN
INTRAMUSCULAR | Status: AC
  Filled 2011-07-06: qty 30

## 2011-07-06 MED ORDER — MORPHINE SULFATE 4 MG/ML IJ SOLN
1.0000 mg | INTRAMUSCULAR | Status: DC | PRN
  Administered 2011-07-06 – 2011-07-07 (×2): 2 mg via INTRAVENOUS
  Filled 2011-07-06: qty 1

## 2011-07-06 MED ORDER — CLOPIDOGREL BISULFATE 75 MG PO TABS
75.0000 mg | ORAL_TABLET | Freq: Every day | ORAL | Status: DC
  Administered 2011-07-07: 75 mg via ORAL
  Filled 2011-07-06: qty 1

## 2011-07-06 MED ORDER — LIDOCAINE-EPINEPHRINE 1 %-1:100000 IJ SOLN
INTRAMUSCULAR | Status: AC
  Filled 2011-07-06: qty 1

## 2011-07-06 MED ORDER — NITROGLYCERIN 0.2 MG/ML ON CALL CATH LAB
INTRAVENOUS | Status: AC
  Filled 2011-07-06: qty 1

## 2011-07-06 MED ORDER — ONDANSETRON HCL 4 MG/2ML IJ SOLN: 4.0000 mg | Freq: Four times a day (QID) | INTRAMUSCULAR | Status: DC | PRN

## 2011-07-06 MED ORDER — METOPROLOL TARTRATE 25 MG PO TABS
25.0000 mg | ORAL_TABLET | Freq: Two times a day (BID) | ORAL | Status: DC
  Administered 2011-07-06 – 2011-07-07 (×2): 25 mg via ORAL
  Filled 2011-07-06 (×3): qty 1

## 2011-07-06 MED ORDER — SODIUM CHLORIDE 0.9 % IV SOLN: 250.0000 mL | INTRAVENOUS | Status: DC | PRN

## 2011-07-06 MED ORDER — METHOTREXATE 2.5 MG PO TABS: 15.0000 mg | ORAL_TABLET | ORAL | Status: DC

## 2011-07-06 MED ORDER — FOLIC ACID 1 MG PO TABS
1.0000 mg | ORAL_TABLET | Freq: Every day | ORAL | Status: DC
  Administered 2011-07-06: 1 mg via ORAL
  Filled 2011-07-06 (×2): qty 1

## 2011-07-06 MED ORDER — MORPHINE SULFATE 4 MG/ML IJ SOLN
INTRAMUSCULAR | Status: AC
  Filled 2011-07-06: qty 1

## 2011-07-06 MED ORDER — SIMVASTATIN 40 MG PO TABS
40.0000 mg | ORAL_TABLET | Freq: Every day | ORAL | Status: DC
  Filled 2011-07-06: qty 1

## 2011-07-06 MED ORDER — NITROGLYCERIN IN D5W 200-5 MCG/ML-% IV SOLN: 10.0000 ug/min | INTRAVENOUS | Status: DC

## 2011-07-06 MED ORDER — INSULIN ASPART PROT & ASPART (70-30 MIX) 100 UNIT/ML ~~LOC~~ SUSP: 4.0000 [IU] | Freq: Two times a day (BID) | SUBCUTANEOUS | Status: DC

## 2011-07-06 MED ORDER — INSULIN ASPART 100 UNIT/ML ~~LOC~~ SOLN: 9.0000 [IU] | Freq: Once | SUBCUTANEOUS | Status: DC

## 2011-07-06 NOTE — Progress Notes (Signed)
IV team paged for IV start. 

## 2011-07-06 NOTE — Progress Notes (Signed)
IV Team notified and patient put on IV Team's list to receive PICC vs Midline catheter per MD order.

## 2011-07-06 NOTE — H&P (Signed)
  Date of Initial H&P: 06/27/11 History reviewed, patient examined, no change in status, stable for cardiac cath.

## 2011-07-06 NOTE — Op Note (Signed)
PROCEDURE:  Left heart catheterization with selective coronary angiography, left ventriculogram.  PCI RCA.  INDICATIONS:    The risks, benefits, and details of the procedure were explained to the patient.  The patient verbalized understanding and wanted to proceed.  Informed written consent was obtained.  PROCEDURE TECHNIQUE:  Unable to get IV access.  After Xylocaine anesthesia a 26F, sheath was placed in the right femoral vein and a 73F sheath was placed in the right femoral artery with a single anterior needle wall stick.   Left coronary angiography was done using a Judkins L4 guide catheter.  Right coronary angiography was done using a Judkins R4 guide catheter.  Left ventriculography was done using a pigtail catheter.  PCI performed with a JR 4 with sideholes.  Angiomax for anticoagulation.  Multiple doses of IC NTG given for vasospasm.  Angioseal for hemostasis.   CONTRAST:  Total of 185 cc.  COMPLICATIONS:  None.    HEMODYNAMICS:  Aortic pressure was 127/69; LV pressure was 127/5; LVEDP 9 mm mercury.  There was no gradient between the left ventricle and aorta.    ANGIOGRAPHIC DATA:   The left main coronary artery is a short vessel but widely patent.  The left anterior descending artery is is a large vessel which wraps around the apex.  There is a medium-sized first diagonal which is widely patent.  There is a 50% mid vessel lesion..  The left circumflex artery is is a large vessel with mild atherosclerosis in the midportion, up to 25%.  There is a large OM 2 branch which appears widely patent.  The OM1 is small but patent..  The right coronary artery is a medium size vessel proximally. there is moderate disease in the mid vessel.  The stent in the mid vessel has severe in-stent restenosis up to 90%. the stent in the posterior lateral artery also has 90% in-stent restenosis. the distal right circulation has competitive flow from left to right collaterals.  The entire distal right coronary  artery appears diseased.  LEFT VENTRICULOGRAM:  Left ventricular angiogram was done in the 30 RAO projection and revealed mid to basal inferior wall hypokinesis.  The estimated left ventricular ejection fraction is 50% %.  LVEDP was 9 mmHg.  PCI NARRATIVE: A JR 4 guiding catheter with sideholes was used to engage the right coronary artery.  A Proler wire was placed into the right coronary artery system and across the diseased area in the distal right and posterior lateral artery.  A 2.0 x 20 emergent balloon was advanced to the distal right coronary artery and multiple inflations were made up to 10 atmospheres.  The distal runoff from the right coronary artery was still small.  The hope was that with better forward flow, these vessels may  increase in size.  Multiple doses of intracoronary nitroglycerin was administered.  IV nitroglycerin was also started.  Subsequently, a 2.25 x 32 Promus elements stent was deployed in the most distal portion of the diseased area.  More proximal to this a 2.5 x 32 Promus elements stent was deployed to 14 atmospheres.  In overlapping fashion, another 2.5 x 32 Promus elements stent was deployed to cover the entire diseased area.  There is no residual stenosis in the stented area.  Despite multiple doses of intracoronary nitroglycerin, the distal vessels did not increase significantly in size.  There were 2 small vessels of runoff in the posterior lateral artery territory.  Flow was improved compared to the original images.  IMPRESSIONS:  1. 50% mid LAD lesion 2. Normal left ventricular systolic function.  LVEDP 9 mmHg.  Ejection fraction 50%. 3. Severe disease of the right coronary artery.  Successful overlapping drug-eluting stents placed from the mid to distal right coronary artery.  RECOMMENDATION:  The patient will be watched overnight.  Continue dual antiplatelet therapy indefinitely.  We'll also continue IV nitroglycerin for at least a few hours.

## 2011-07-06 NOTE — Progress Notes (Signed)
Site area: right groin  Site Prior to Removal:  Level 0  Pressure Applied For 10 minutes  Minutes Beginning at 2155  Manual:   yes  Patient Status During Pull:  stable  Post Pull Groin Site:  Level 0  Post Pull Instructions Given:  yes  Post Pull Pulses Present:  yes  Dressing Applied:  yes  Comments:  Pt tolerated procedure well

## 2011-07-07 ENCOUNTER — Other Ambulatory Visit: Payer: Self-pay

## 2011-07-07 LAB — BASIC METABOLIC PANEL
CO2: 24 mEq/L (ref 19–32)
Calcium: 9.1 mg/dL (ref 8.4–10.5)
Chloride: 100 mEq/L (ref 96–112)
Glucose, Bld: 186 mg/dL — ABNORMAL HIGH (ref 70–99)
Sodium: 134 mEq/L — ABNORMAL LOW (ref 135–145)

## 2011-07-07 LAB — CBC
HCT: 31 % — ABNORMAL LOW (ref 36.0–46.0)
MCH: 29.1 pg (ref 26.0–34.0)
MCV: 87.6 fL (ref 78.0–100.0)
RBC: 3.54 MIL/uL — ABNORMAL LOW (ref 3.87–5.11)
WBC: 9.8 10*3/uL (ref 4.0–10.5)

## 2011-07-07 LAB — GLUCOSE, CAPILLARY

## 2011-07-07 LAB — PLATELET INHIBITION P2Y12: Platelet Function  P2Y12: 77 [PRU] — ABNORMAL LOW (ref 194–418)

## 2011-07-07 MED ORDER — PANTOPRAZOLE SODIUM 40 MG PO TBEC: 40.0000 mg | DELAYED_RELEASE_TABLET | Freq: Every day | ORAL | Status: DC

## 2011-07-07 MED ORDER — INSULIN ASPART 100 UNIT/ML ~~LOC~~ SOLN
10.0000 [IU] | Freq: Once | SUBCUTANEOUS | Status: AC
  Administered 2011-07-07: 10 [IU] via SUBCUTANEOUS

## 2011-07-07 MED FILL — Dextrose Inj 5%: INTRAVENOUS | Qty: 50 | Status: AC

## 2011-07-07 NOTE — Progress Notes (Signed)
Pt refused lab draws. States she only gets lab draws from her foot. MD aware. Carollee Herter

## 2011-07-07 NOTE — Progress Notes (Signed)
CARDIAC REHAB PHASE I   PRE:  Rate/Rhythm: 86SR  BP:  Supine: 97/61  Sitting:   Standing:    SaO2:   MODE:  Ambulation: 340 ft   POST:  Rate/Rhythem: 99SR  BP:  Supine:   Sitting: 100/71  Standing:    SaO2:  1610-9604 Pt walked 340 ft with asst x 1. Tolerated well. No c/o CP. Completed education with pt. Permission given to refer to [place] Phase 2.  Duanne Limerick

## 2011-07-07 NOTE — Consult Note (Signed)
Pt smokes 1/2 ppd and is in action stage. After long discussion pt decided to try nicotine lozenges. Recommended the 4 mg nicotine lozenges PRN. Discussed lozenge use instructions. Referred to 1-800 quit now for f/u and support. Discussed oral fixation substitutes, second hand smoke and in home smoking policy. Reviewed and gave pt Written education/contact information.

## 2011-07-07 NOTE — Discharge Summary (Signed)
Patient IDAnalena [name] MRN: [id number] DOB/AGE: Apr 08, 1966 45 y.o.  Admit date: 07/06/2011 Discharge date: 07/07/2011  Primary Discharge Diagnosis CAD , angina Secondary Discharge Diagnosis Diabetes  Significant Diagnostic Studies: angiography: -Cardiac cath with overlapping DES to the RCA.  Consults: none  Hospital Course:  Patient was admitted after PCI to the RCA.  It was a long area of de novo stenosis and in stent restenosis.  3 overlapping DES were placed.  She tolerated the procedure well. She walked the next morning with cardiac rehab and had no angina.  She felt that she would have had angina doing this activity, prior to this procedure.  Due to the long area of stent to the right coronary artery, we checked Plavix platelet inhibition studies.  It appears that Plavix is adequate for antiplatelet therapy.  She is allergic to aspirin.  There is difficulty in drawing her blood.  We're allowing blood draws in the feet due to her preference.  We'll also change omeprazole to pantoprazole since she is relying on Plavix.     Discharge Exam: Blood pressure 114/73, pulse 98, temperature 98 F (36.7 C), temperature source Oral, resp. rate 14, height 5' (1.524 m), weight 72.9 kg (160 lb 11.5 oz), last menstrual period 06/11/2011, SpO2 92.00%.   [place]/AT RRR S1, S2 CTA bilaterally No right groin hematoma, no edema Labs: Hbg 10.3 Cr. 0.8  K 4.8   Lab Results  Component Value Date   WBC 8.6 02/17/2011   HGB 9.3* 02/17/2011   HCT 27.8* 02/17/2011   MCV 87.4 02/17/2011   PLT 429* 02/17/2011   No results found for this basename: NA,K,CL,CO2,BUN,CREATININE,CALCIUM,LABALBU,PROT,BILITOT,ALKPHOS,ALT,AST,GLUCOSE in the last 168 hours Lab Results  Component Value Date   CKTOTAL 40 02/09/2011   CKMB 1.2 02/09/2011   TROPONINI <0.30 02/09/2011    Lab Results  Component Value Date   CHOL  Value: 184        ATP III CLASSIFICATION:  <200     mg/dL   Desirable  409-811  mg/dL   Borderline Brash  >=914     mg/dL   [place]        02/05/2955   CHOL 183 08/25/2010   CHOL  Value: 113        ATP III CLASSIFICATION:  <200     mg/dL   Desirable  213-086  mg/dL   Borderline Reif  >=578    mg/dL   [place]        4/69/6295   Lab Results  Component Value Date   HDL 67 09/05/2010   HDL 57 08/25/2010   HDL 37* 01/14/2009   Lab Results  Component Value Date   LDLCALC  Value: 107        Total Cholesterol/HDL:CHD Risk Coronary Heart Disease Risk Table                     Men   Women  1/2 Average Risk   3.4   3.3  Average Risk       5.0   4.4  2 X Average Risk   9.6   7.1  3 X Average Risk  23.4   11.0        Use the calculated Patient Ratio above and the CHD Risk Table to determine the patient's CHD Risk.        ATP III CLASSIFICATION (LDL):  <100     mg/dL   Optimal  284-132  mg/dL   Near or Above  Optimal  130-159  mg/dL   Borderline  696-295  mg/dL   [place]  >284     mg/dL   Very [place]* 08/02/2438   LDLCALC 101* 08/25/2010   LDLCALC  Value: 63        Total Cholesterol/HDL:CHD Risk Coronary Heart Disease Risk Table                     Men   Women  1/2 Average Risk   3.4   3.3  Average Risk       5.0   4.4  2 X Average Risk   9.6   7.1  3 X Average Risk  23.4   11.0        Use the calculated Patient Ratio above and the CHD Risk Table to determine the patient's CHD Risk.        ATP III CLASSIFICATION (LDL):  <100     mg/dL   Optimal  102-725  mg/dL   Near or Above                    Optimal  130-159  mg/dL   Borderline  366-440  mg/dL   [place]  >347     mg/dL   Very [place] 11/22/9561   Lab Results  Component Value Date   TRIG 52 09/05/2010   TRIG 123 08/25/2010   TRIG 66 01/14/2009   Lab Results  Component Value Date   CHOLHDL 2.7 09/05/2010   CHOLHDL 3.2 Ratio 08/25/2010   CHOLHDL 3.1 01/14/2009   No results found for this basename: LDLDIRECT       EKG: Normal sinus rhythm, inferior Q waves, no significant ST segment changes  FOLLOW UP PLANS AND APPOINTMENTS  Current Discharge Medication List    START taking  these medications   Details  pantoprazole (PROTONIX) 40 MG tablet Take 1 tablet (40 mg total) by mouth daily at 6 (six) AM. Qty: 30 tablet, Refills: 11      CONTINUE these medications which have NOT CHANGED   Details  clopidogrel (PLAVIX) 75 MG tablet Take 75 mg by mouth daily.      ferrous sulfate 325 (65 FE) MG tablet Take 325 mg by mouth daily with breakfast.      folic acid (FOLVITE) 1 MG tablet Take 1 mg by mouth daily.      insulin aspart protamine-insulin aspart (NOVOLOG 70/30) (70-30) 100 UNIT/ML injection Inject 4-20 Units into the skin daily as needed. Sliding scale    insulin glargine (LANTUS) 100 UNIT/ML injection Inject 45 Units into the skin every morning.     lisinopril (PRINIVIL,ZESTRIL) 5 MG tablet Take 5 mg by mouth daily.      methotrexate (RHEUMATREX) 2.5 MG tablet Take 15 mg by mouth once a week. Wednesday. Caution:Chemotherapy. Protect from light.     metoCLOPramide (REGLAN) 10 MG tablet Take 10 mg by mouth 4 (four) times daily.      metoprolol tartrate (LOPRESSOR) 25 MG tablet Take 25 mg by mouth 2 (two) times daily.      pravastatin (PRAVACHOL) 80 MG tablet Take 80 mg by mouth daily.      sertraline (ZOLOFT) 100 MG tablet Take 100 mg by mouth daily.      traMADol (ULTRAM) 50 MG tablet Take 50 mg by mouth every 6 (six) hours as needed. For pain    nitroGLYCERIN (NITROSTAT) 0.4 MG SL tablet Place 0.4 mg under the tongue every 5 (five) minutes as needed.  For chest pain      STOP taking these medications     omeprazole (PRILOSEC) 40 MG capsule        Follow-up Information    Follow up with Yamilet Mcfayden S.. Make an appointment in 2 weeks. (12/26 10:45 AM)    Contact information:   [address] [employer] [place] [address] [phone]          BRING ALL MEDICATIONS WITH YOU TO FOLLOW UP APPOINTMENTS  Time spent with patient to include physician time:25 Signed: Corky Crafts. 07/07/2011, 8:34 AM

## 2011-07-15 IMAGING — CR DG CHEST 2V
2 series · 2 of 2 positions shown · non-contrast
Comparison: 09/28/2010

CLINICAL DATA: Pain and hypoglycemia.

CHEST - 2 VIEW

[w chest pa]
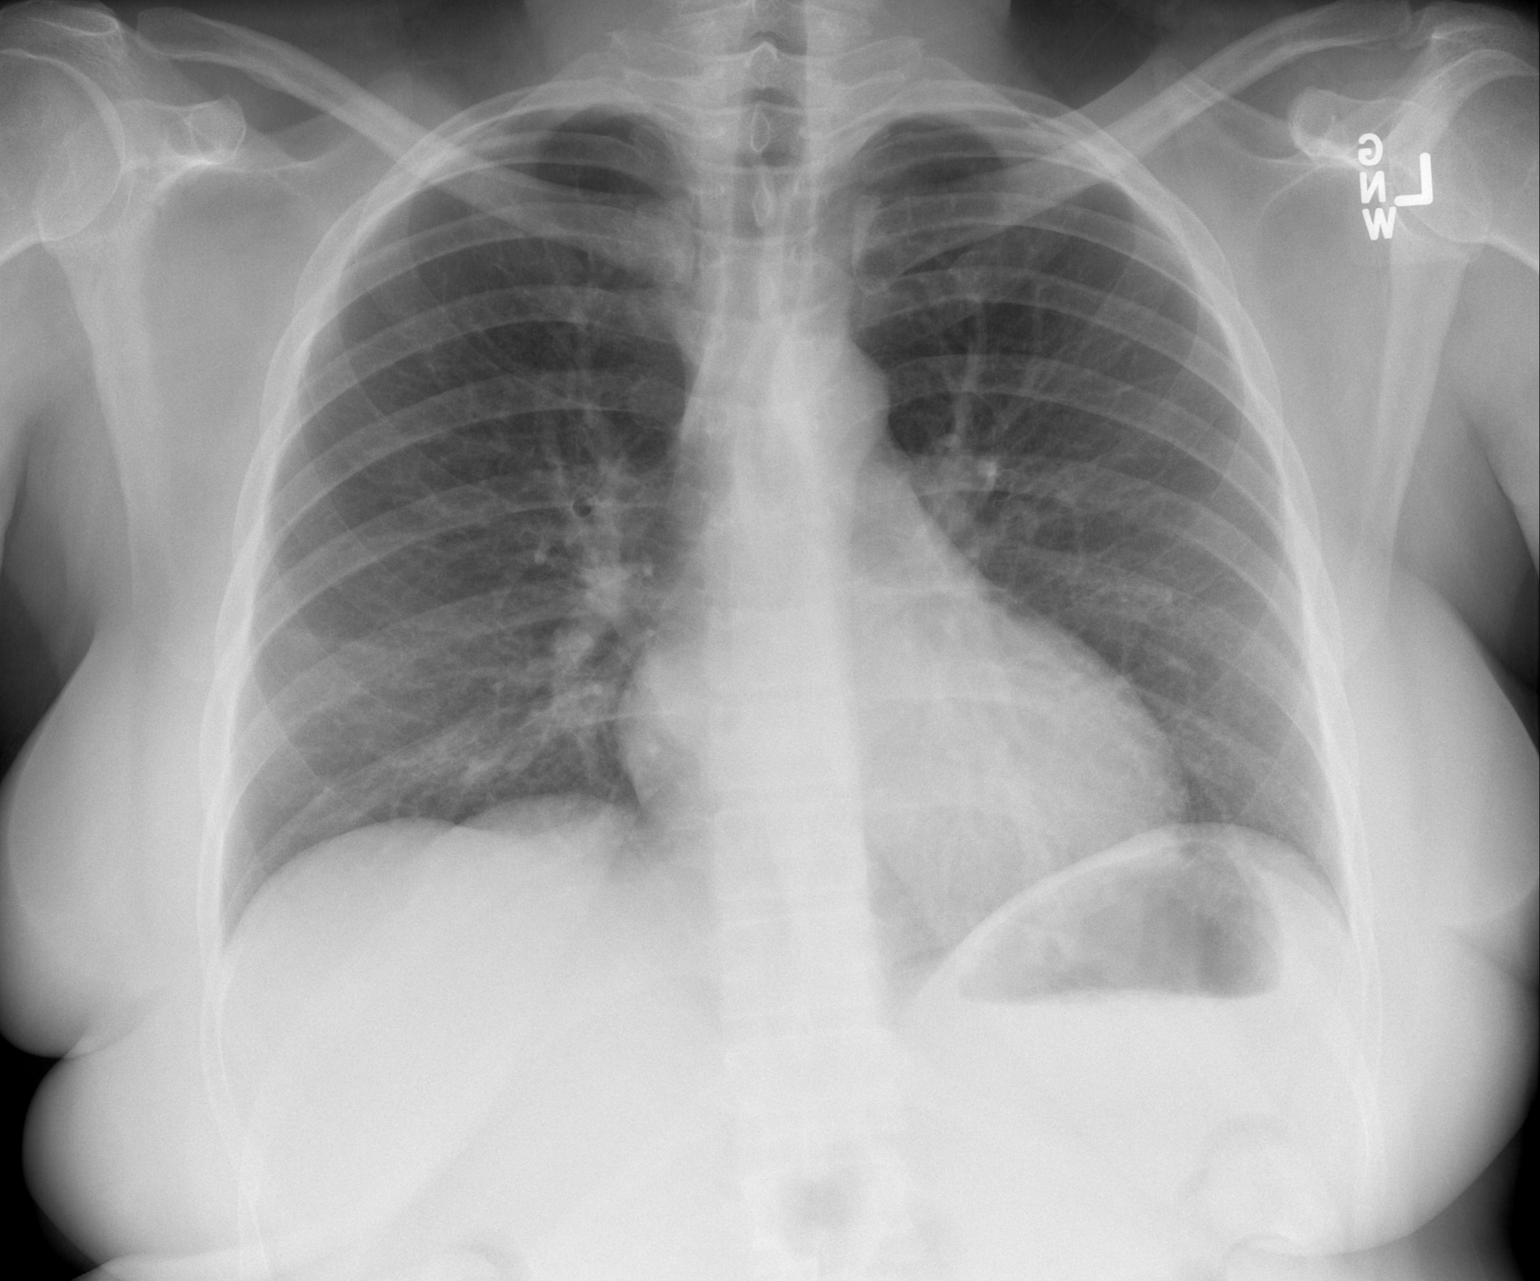

[w chest lat]
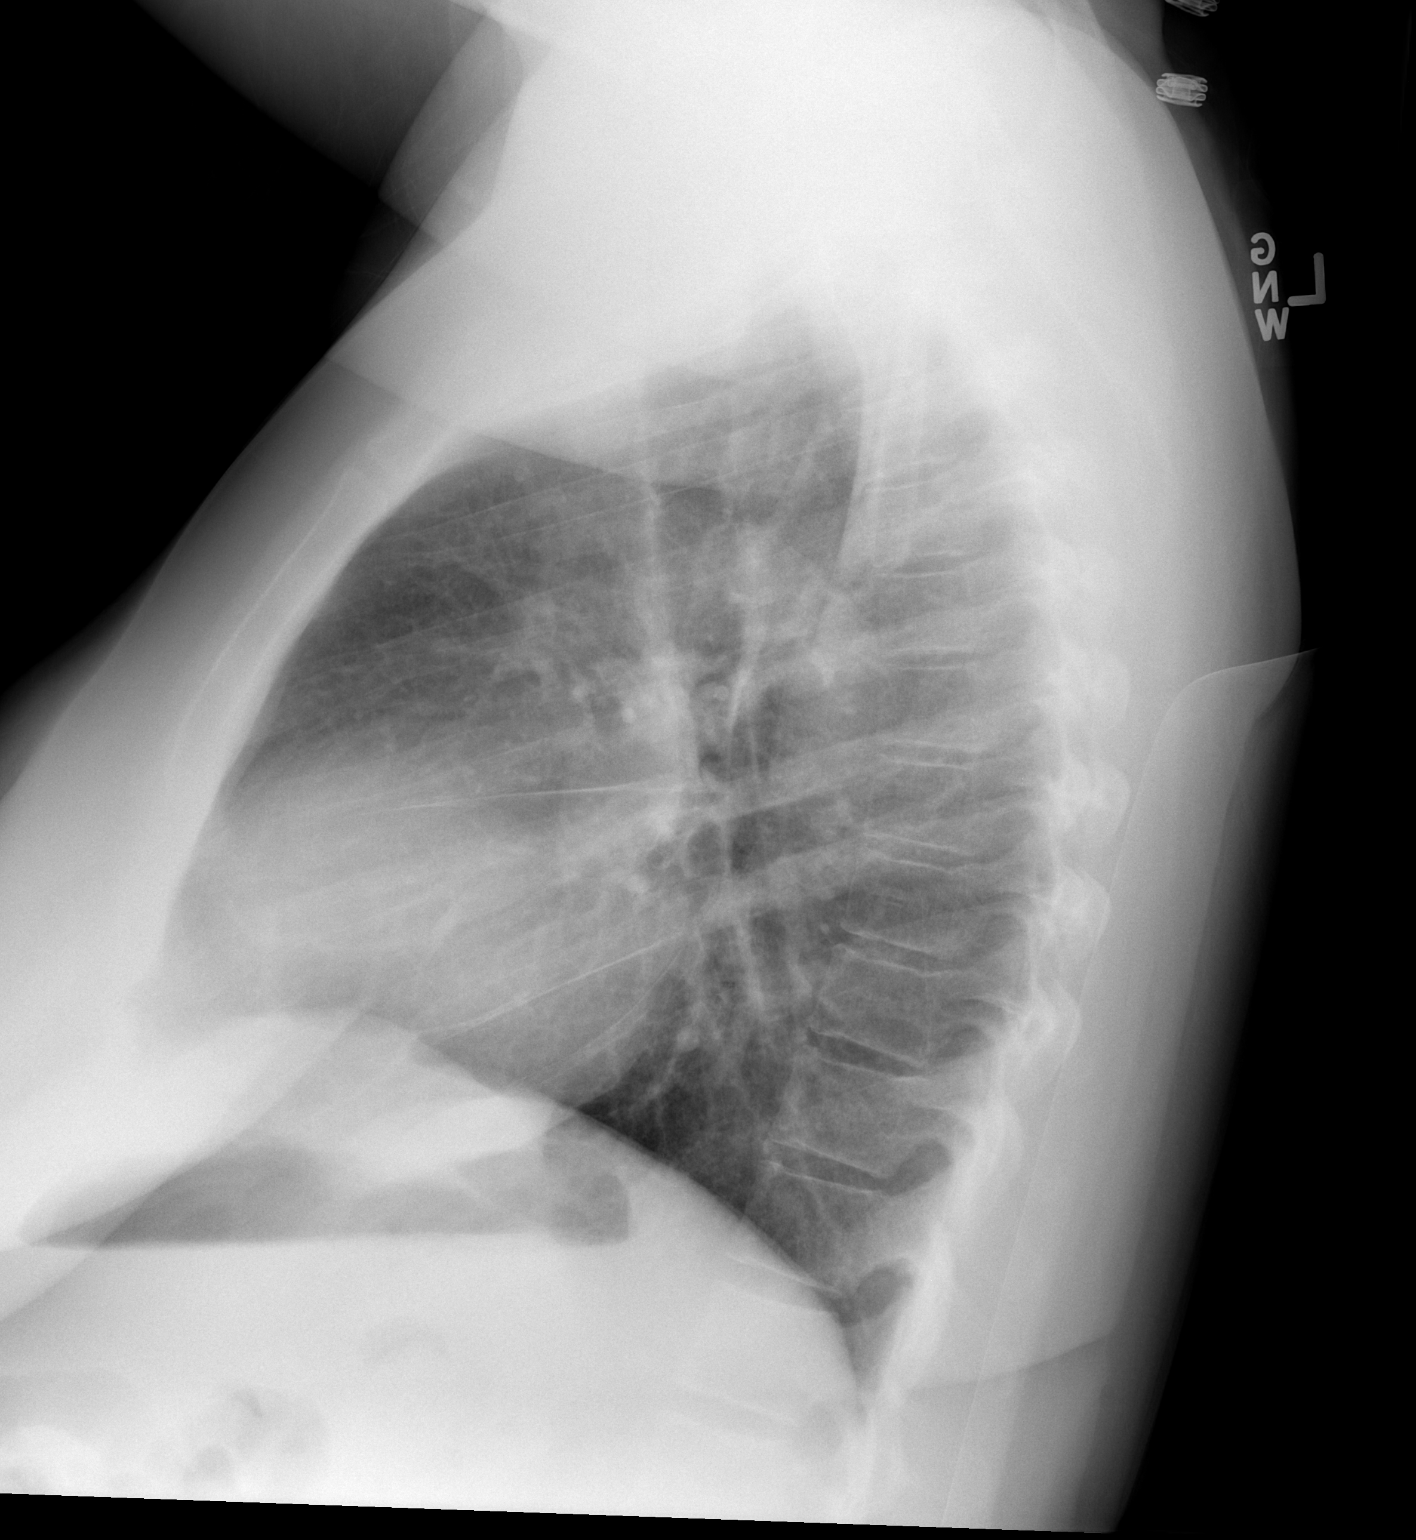

[2 of 2 positions shown; findings below may reference images not displayed]

FINDINGS: Trachea is midline.  Heart size normal.  Lungs are
somewhat low in volume but clear.  No pleural fluid.
IMPRESSION: Low lung volumes without acute finding.

## 2011-08-11 ENCOUNTER — Other Ambulatory Visit: Payer: Self-pay | Admitting: Internal Medicine

## 2011-08-11 DIAGNOSIS — Z1239 Encounter for other screening for malignant neoplasm of breast: Secondary | ICD-10-CM

## 2011-08-25 IMAGING — CR DG CHEST 1V PORT
1 series · 1 of 1 positions shown · non-contrast
Comparison: Chest x-ray 12/13/2010.

CLINICAL DATA: Chest pain.

PORTABLE CHEST - 1 VIEW

[view not recorded]
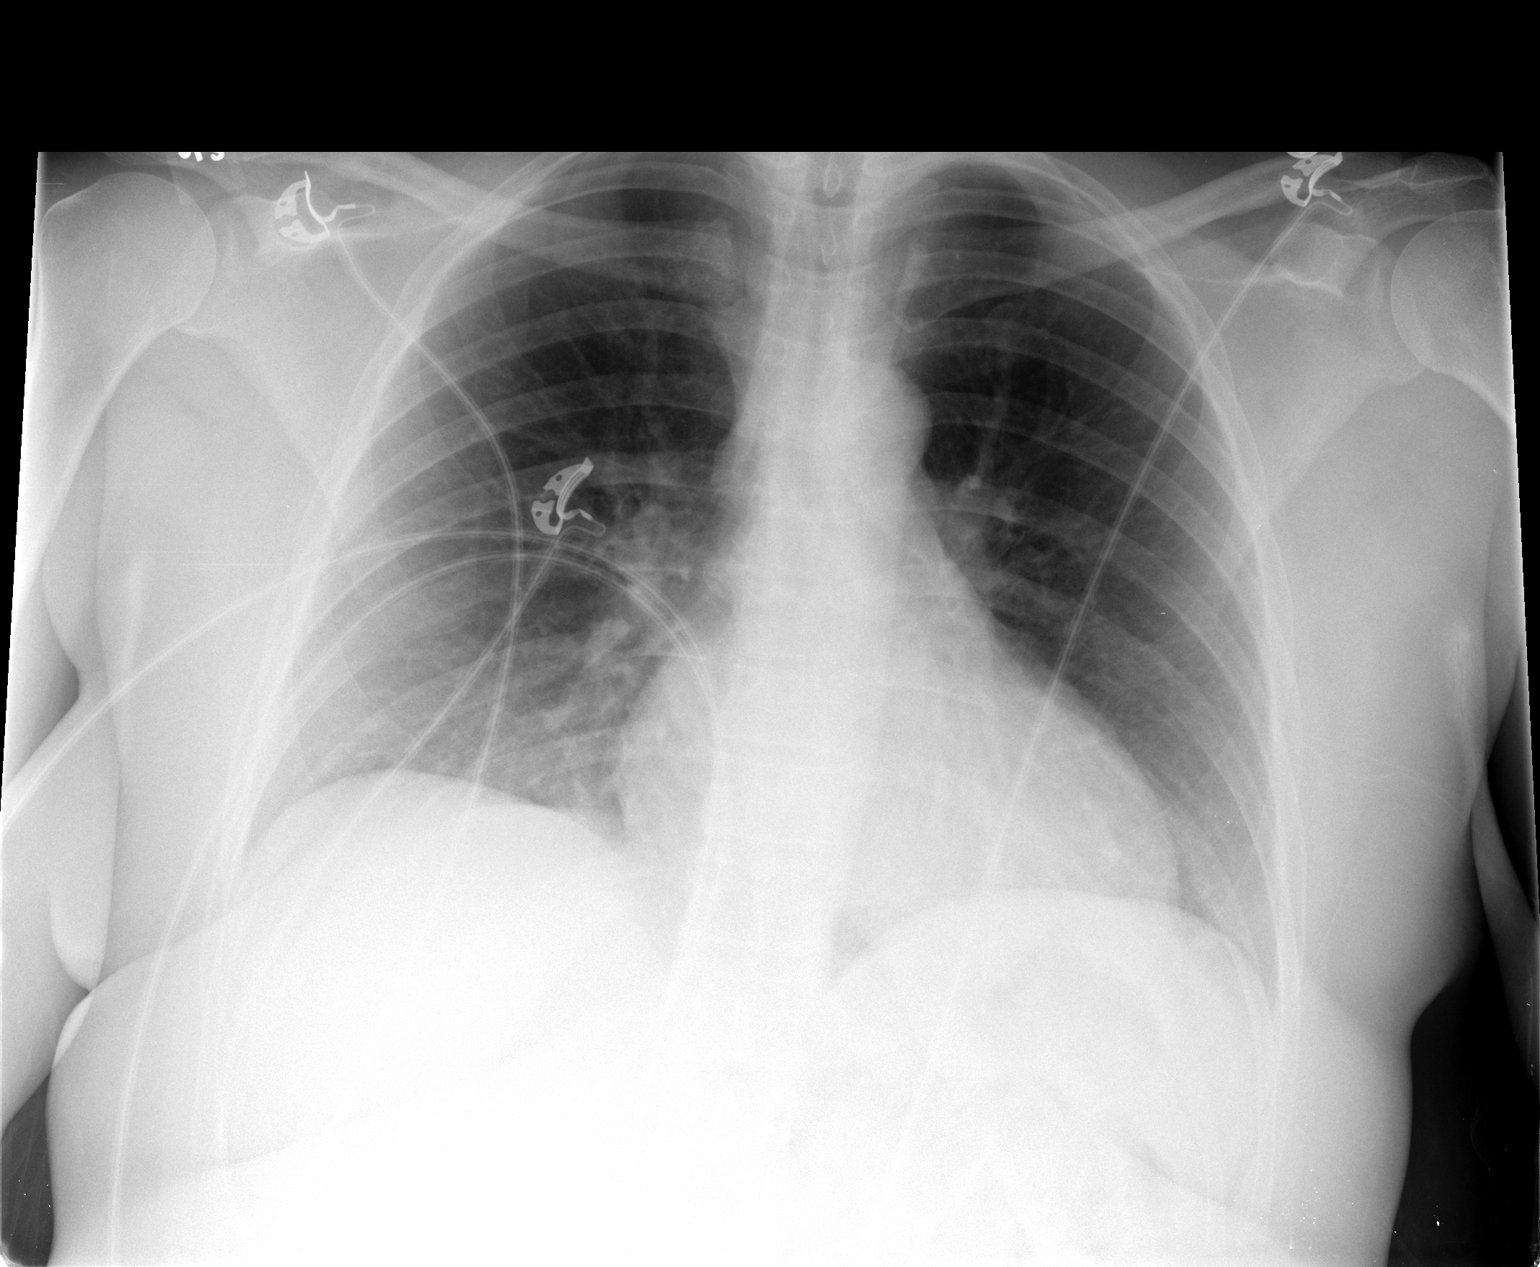

[1 of 1 positions shown; findings below may reference images not displayed]

FINDINGS: The cardiac silhouette, mediastinal and hilar contours
are within normal limits and stable. The lungs are clear.  No
pleural effusions.  The bony thorax is intact.
IMPRESSION: No acute cardiopulmonary findings.

## 2011-08-28 ENCOUNTER — Ambulatory Visit
Admission: RE | Admit: 2011-08-28 | Discharge: 2011-08-28 | Disposition: A | Discharge status: Home / Self Care | Payer: Medicaid Other | Source: Ambulatory Visit | Attending: Internal Medicine | Admitting: Internal Medicine

## 2011-08-28 DIAGNOSIS — Z1239 Encounter for other screening for malignant neoplasm of breast: Secondary | ICD-10-CM

## 2011-09-11 IMAGING — CR DG CHEST 1V PORT
1 series · 1 of 1 positions shown · non-contrast
Comparison: 02/09/2011.

CLINICAL DATA: Chest pain behind right breast.

PORTABLE CHEST - 1 VIEW

[view not recorded]
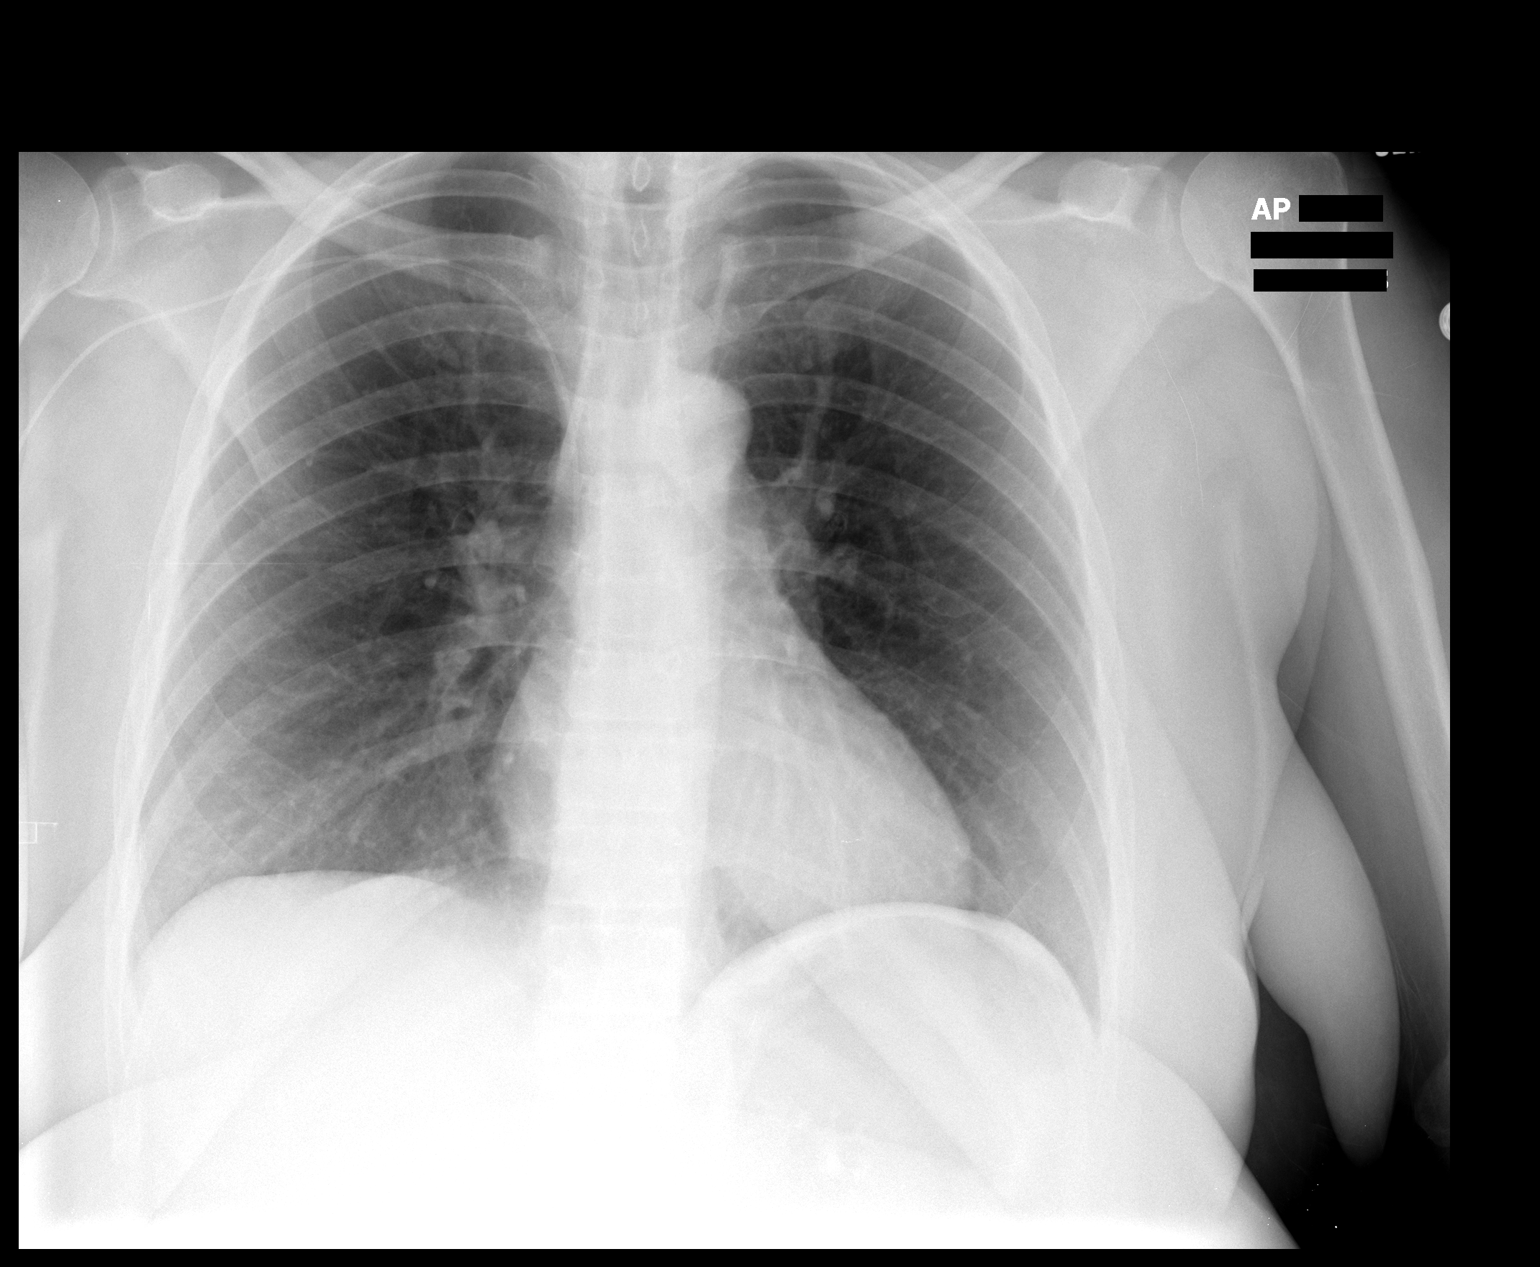

[1 of 1 positions shown; findings below may reference images not displayed]

FINDINGS: Right PICC line tip mid superior vena cava.  No gross
pneumothorax.

No infiltrate or congestive heart failure.  Mild central pulmonary
vascular prominence stable.  Heart size within normal limits.
IMPRESSION: Right PICC line tip mid superior vena cava.

No infiltrate, congestive heart failure or pneumothorax.

## 2011-09-11 IMAGING — CT CT ABD-PELV W/ CM
2 of 5 series · 13 of 32 positions shown, 18 images · IV contrast (water/omni  & 80ml omni 300)
Comparison: 09/28/2010.

CLINICAL DATA: Nausea and vomiting.  Diabetes, renal insufficiency,
anxiety and myocardial infarction.

CT ABDOMEN AND PELVIS WITH CONTRAST
TECHNIQUE: Multidetector CT imaging of the abdomen and pelvis was
performed following the standard protocol during bolus
administration of intravenous contrast.
Contrast: 80 ml Xmnipaque-UTT.

[Series 2: routine abdomen · axial · 0.70mm/px · z∈[-454,-144]mm · 7 of 83 slices shown, 12 images]
[im 11/83  soft-tissue]
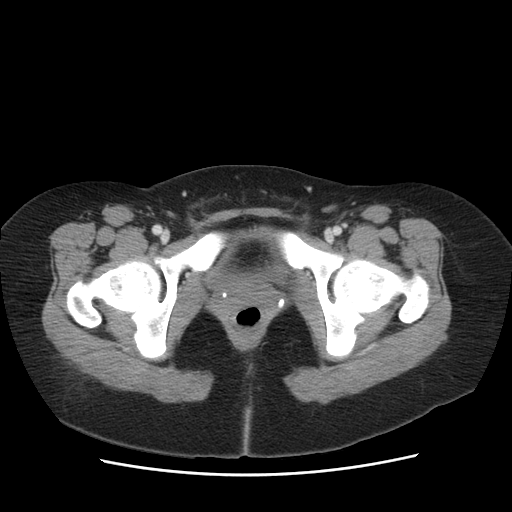
[im 11/83  bone]
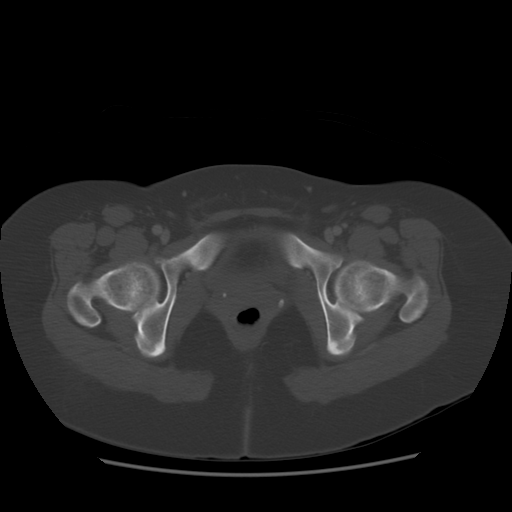
[im 21/83  soft-tissue]
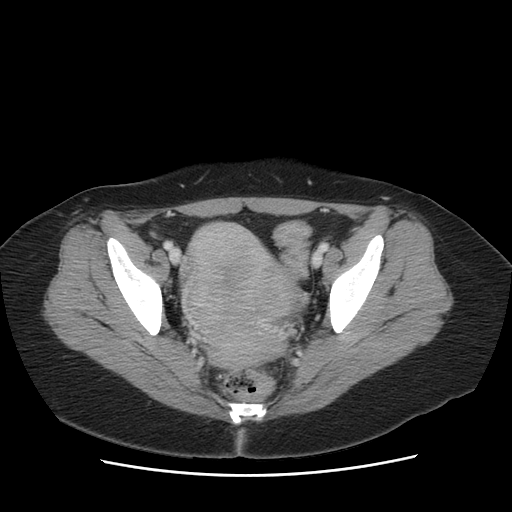
[im 31/83  soft-tissue]
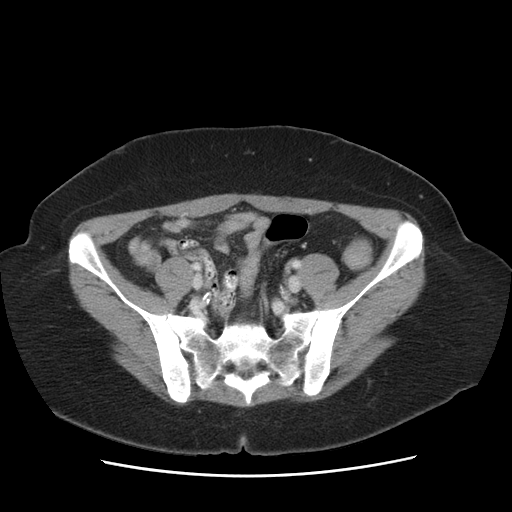
[im 42/83  soft-tissue]
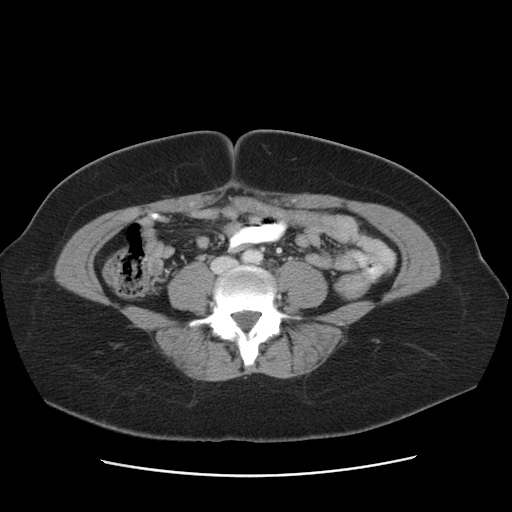
[im 42/83  lung]
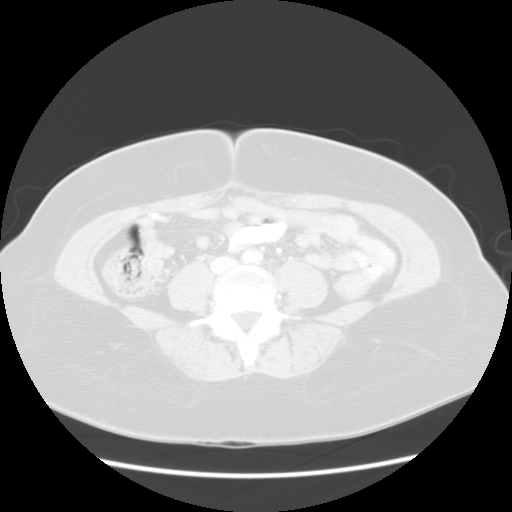
[im 52/83  soft-tissue]
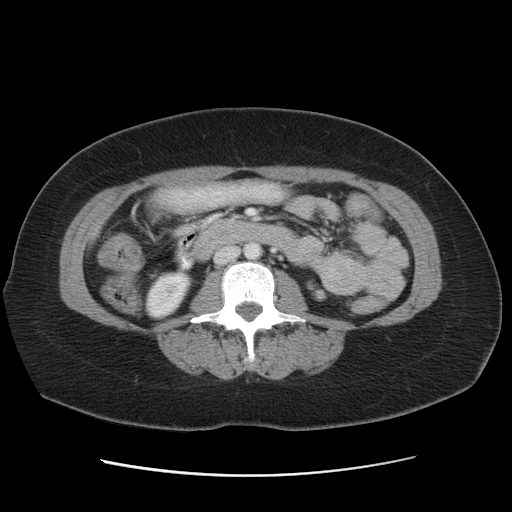
[im 52/83  lung]
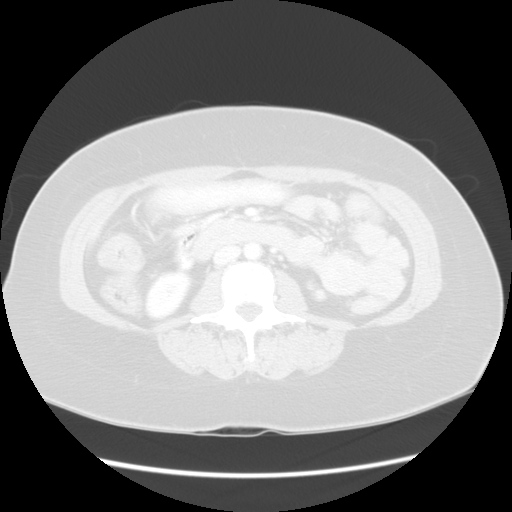
[im 62/83  soft-tissue]
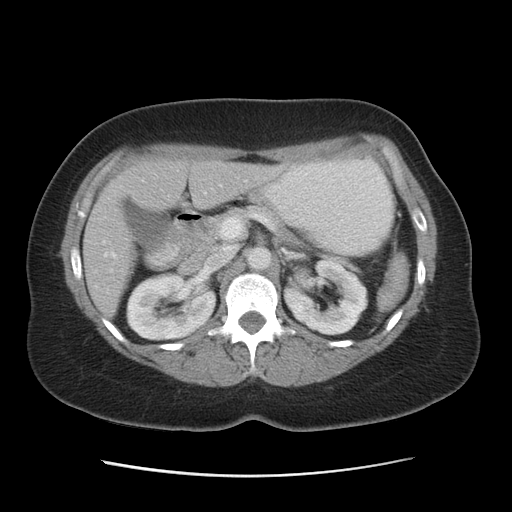
[im 62/83  lung]
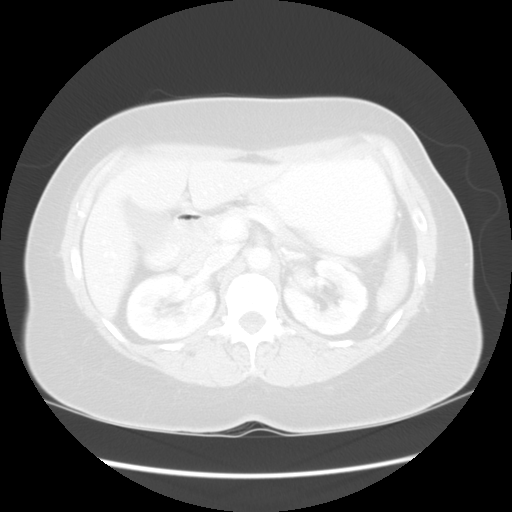
[im 72/83  soft-tissue]
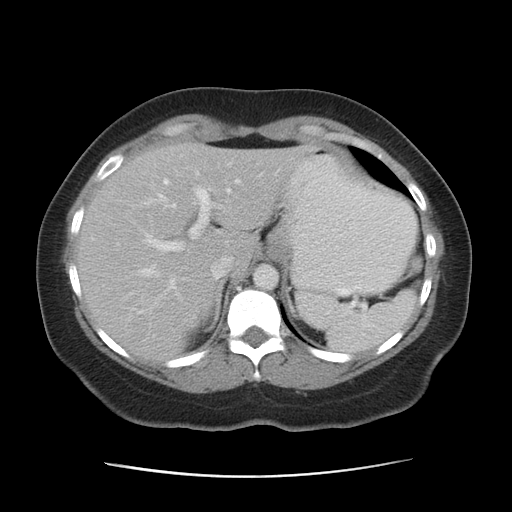
[im 72/83  lung]
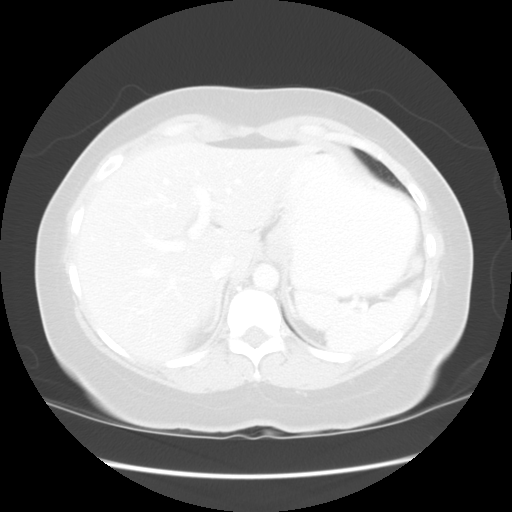

[Series 401: sagittals · sagittal · 0.84mm/px · 6 of 91 slices shown]
[im 11/91  soft-tissue]
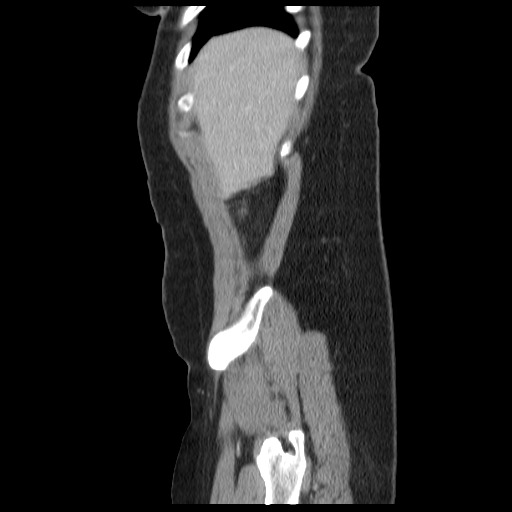
[im 21/91  soft-tissue]
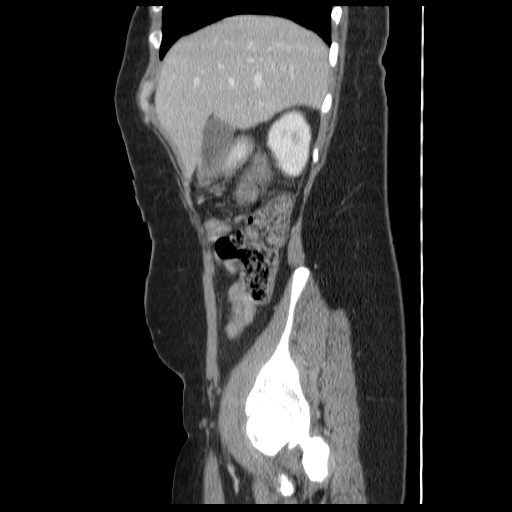
[im 31/91  soft-tissue]
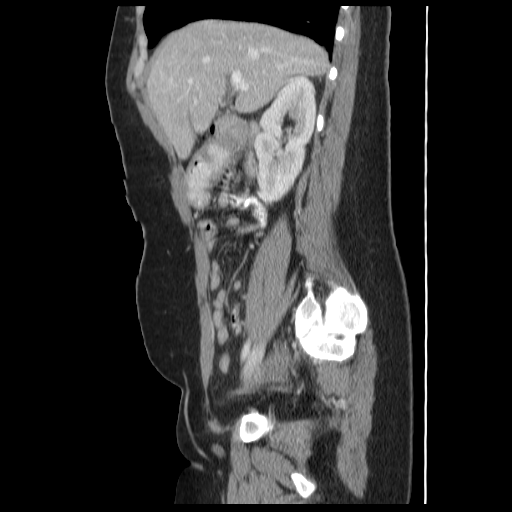
[im 41/91  soft-tissue]
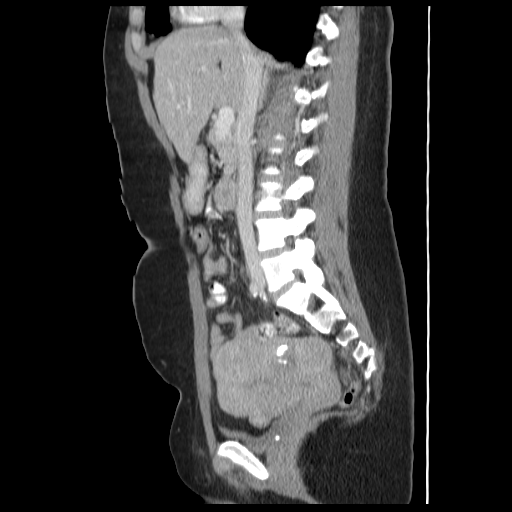
[im 51/91  soft-tissue]
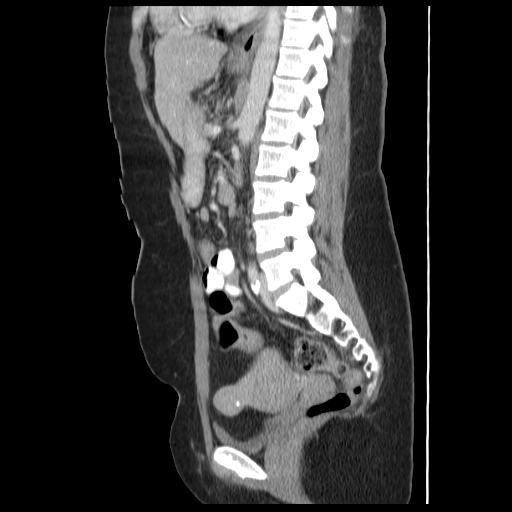
[im 61/91  soft-tissue]
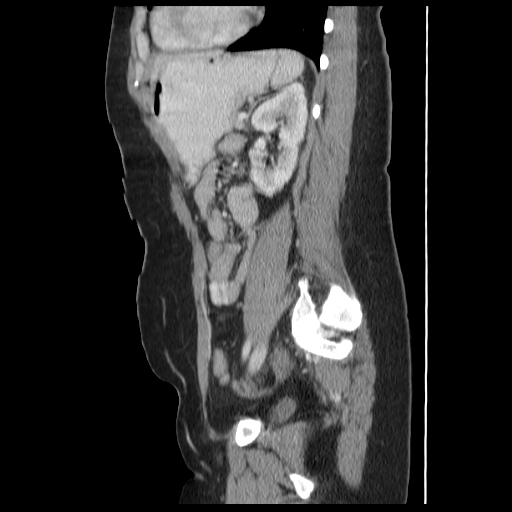

[13 of 32 positions shown; findings below may reference images not displayed]

FINDINGS: Significant thickening of the visualized portion of the
distal esophagus.  This is new from prior exam.  This may represent
prominent esophagitis.  Mass not excluded.

Under distended colon with slightly thickened walls.  This may be
related to under distension although limits evaluation for
possibility of colitis.  The area which is most thickened is the
proximal transverse colon. Attention to this on follow-up if there
are progressive symptoms.  No extraluminal fluid or free
intraperitoneal air.

No inflammation surrounds the appendix.

No focal liver, right renal, adrenal or pancreatic lesion.
Lobulated contour of the left kidney unchanged without focal mass
identified.  No hydronephrosis.  Assessed three splenic tissue.

No calcified gallstones.

Decompressed urinary bladder.

Enlarged uterus with multiple irregular fibroids, some of which are
calcified.  Small ovarian cysts/follicles measuring up to 1.7 cm.

No bony destructive lesion.

Prominent coronary artery calcifications.  Atherosclerotic type
changes of the aorta and iliac arteries with mild narrowing
representing advanced atherosclerotic type changes.

Basilar subsegmental atelectasis.
IMPRESSION: Significant thickening of the visualized portion of the distal
esophagus.  This is new from prior exam.  This may represent
prominent esophagitis.  Mass not excluded.

Under distended colon with slightly thickened walls.  This may be
related to under distension although limits evaluation for
possibility of colitis.  The area which is most thickened is the
proximal transverse colon. Attention to this on follow-up if there
are progressive symptoms.

 Enlarged uterus with multiple irregular fibroids

Advanced atherosclerotic type changes with prominent coronary
artery calcification and calcification of the aorta and iliac
arteries.

## 2011-09-11 IMAGING — CR DG CHEST 1V PORT
1 series · 1 of 1 positions shown · non-contrast
Comparison: 01/23/2011.

CLINICAL DATA: History of PICC placement.  History of hypertension
and diabetes.  History of smoking.

PORTABLE CHEST - 1 VIEW

[view not recorded]
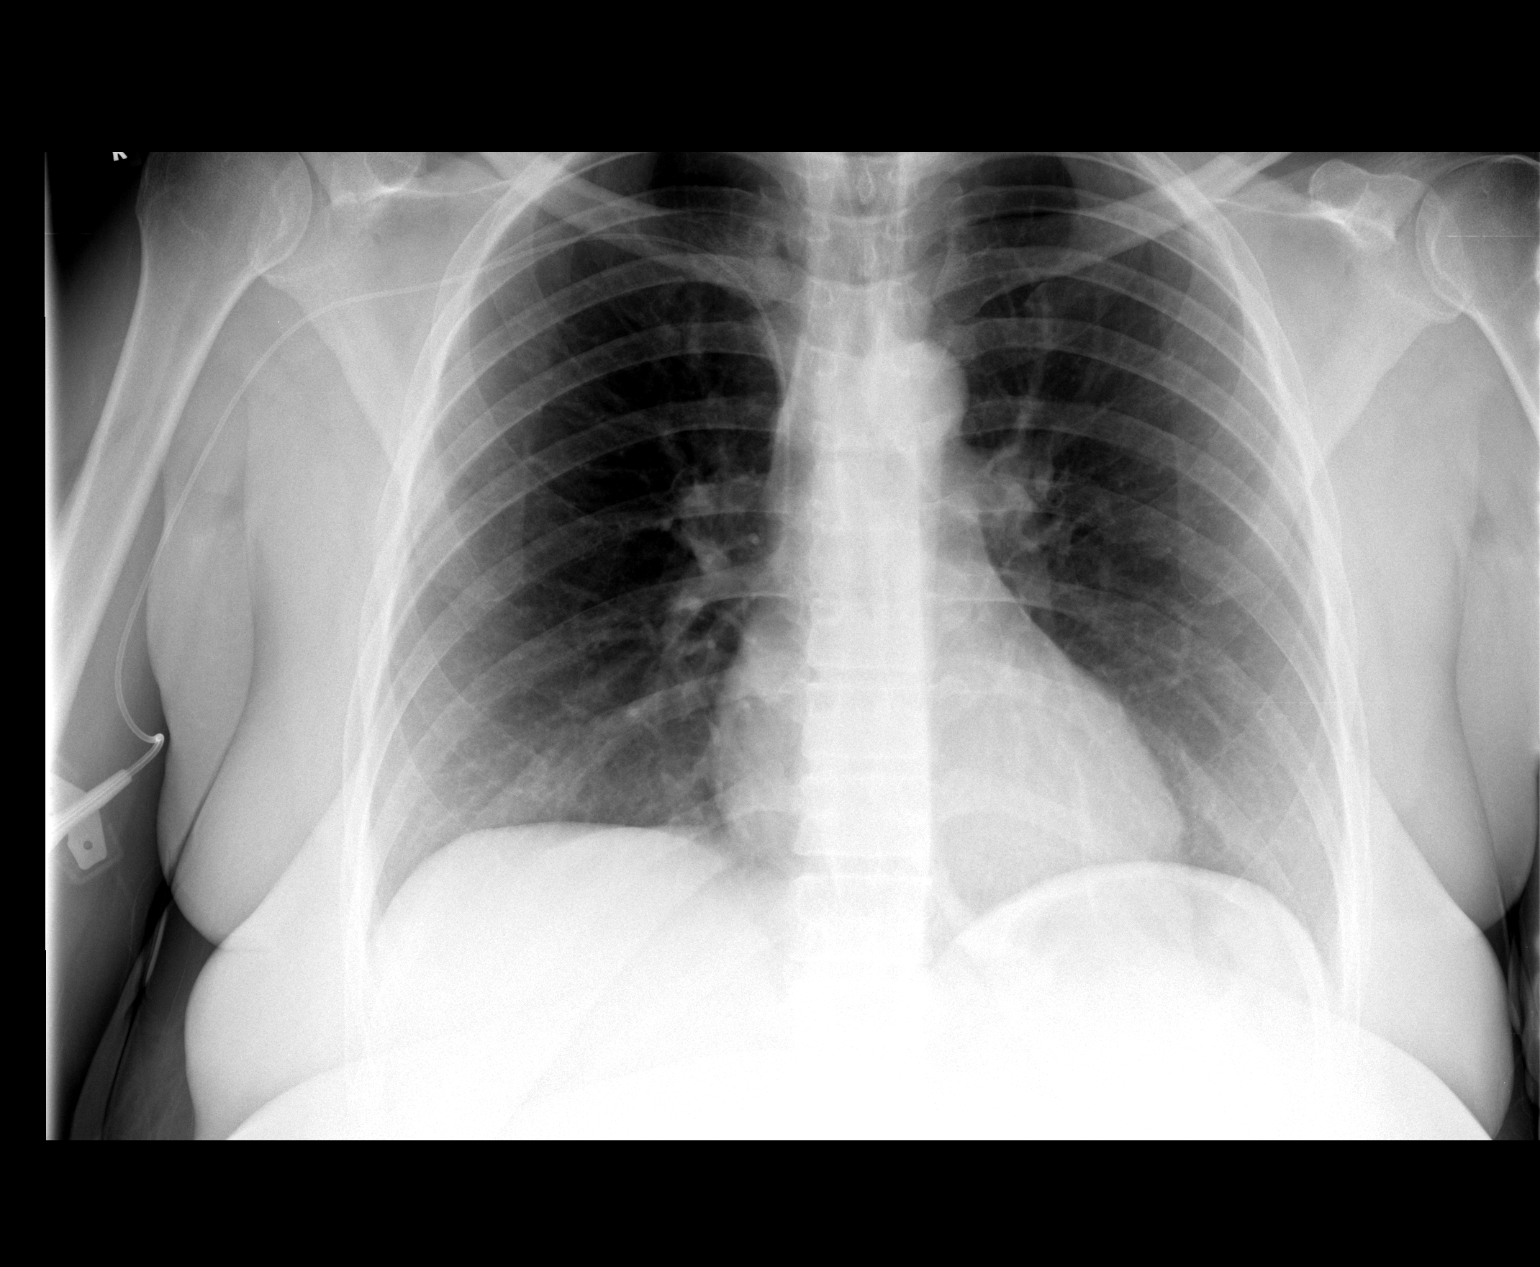

[1 of 1 positions shown; findings below may reference images not displayed]

FINDINGS: Tip of right PICC terminates in the lower superior vena
cava.

The cardiac silhouette is normal size and shape.  Hilar and
mediastinal contours appear stable.  Lungs are free of infiltrates.
No pleural effusion is evident.  No pneumothorax is seen.
IMPRESSION: Tip of right PICC terminates in the lower superior vena cava.  No
pneumothorax is evident.  No acute cardiopulmonary process is
evident.

## 2011-09-12 IMAGING — NM NM PULMONARY VENT & PERF
2 series · 12 of 12 positions shown · non-contrast
Comparison: No prior nuclear imaging.  Portable chest x-ray
yesterday.

CLINICAL DATA: Right-sided chest pain.

NUCLEAR MEDICINE VENTILATION - PERFUSION LUNG SCAN 02/10/2011:
TECHNIQUE: Wash-in, equilibrium, and wash-out phase ventilation
images were obtained using Fe-2RR gas.  Perfusion images were
obtained in multiple projections after intravenous injection of Tc-
99m MAA.
Radiopharmaceuticals:  10 mCi Fe-2RR gas and 6 mCi Qc-NNm MAA.

[vq scan · 2.52mm/px · 6 of 20 frames shown (1 of 2)]
[frame 2/20  full-range]
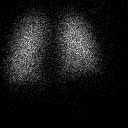
[frame 5/20  full-range]
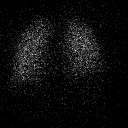
[frame 9/20]
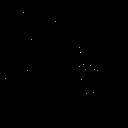
[frame 12/20]
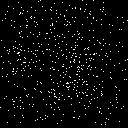
[frame 15/20]
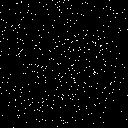
[frame 19/20  full-range]
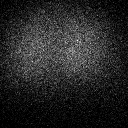

[vq scan · 2.52mm/px · 6 of 20 frames shown (2 of 2)]
[frame 2/20  full-range]
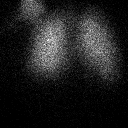
[frame 5/20  full-range]
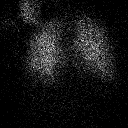
[frame 9/20  full-range]
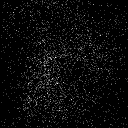
[frame 12/20]
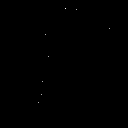
[frame 15/20]
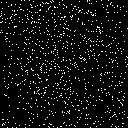
[frame 19/20  full-range]
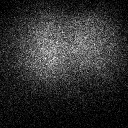

[12 of 12 positions shown; findings below may reference images not displayed]

FINDINGS: Ventilation images demonstrate normal wash-in and wash-
out with no significant air trapping.

Perfusion images demonstrate homogeneous perfusion throughout both
lungs.  No segmental or subsegmental perfusion defects to suggest
pulmonary emboli.
IMPRESSION: Normal nuclear medicine ventilation-perfusion pulmonary imaging.

## 2011-09-13 IMAGING — CT CT ABD-PELV W/O CM
2 of 4 series · 13 of 32 positions shown, 18 images · non-contrast
Comparison: None.

CT CHEST

CLINICAL DATA: Elevated white blood cell count.  Evaluate for drop
in hemoglobin.  Rule out of bowel perforation

CT CHEST, ABDOMEN AND PELVIS WITHOUT CONTRAST
TECHNIQUE: Multidetector CT imaging of the chest, abdomen and
pelvis was performed following the standard protocol without IV
contrast.

[Series 501: cor1 · coronal · 1.23mm/px · 5 of 92 slices shown]
[im 14/92  soft-tissue]
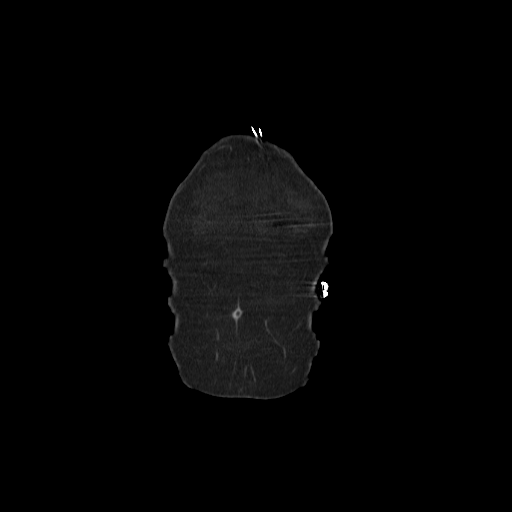
[im 27/92  soft-tissue]
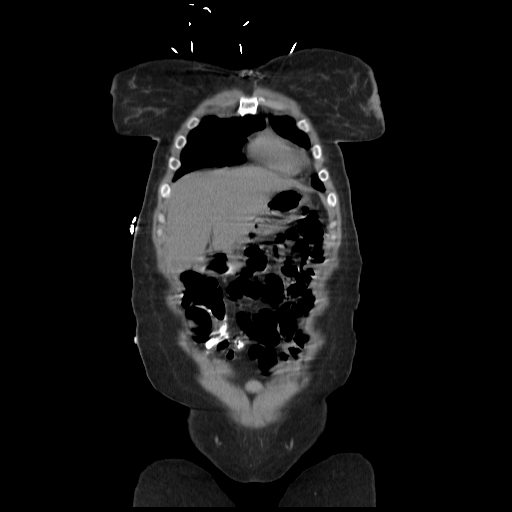
[im 40/92  soft-tissue]
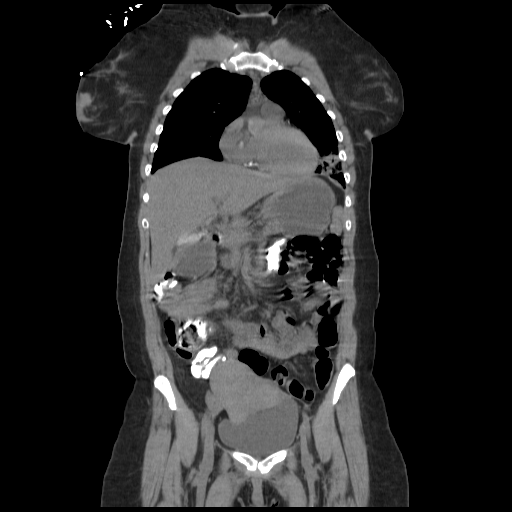
[im 53/92  soft-tissue]
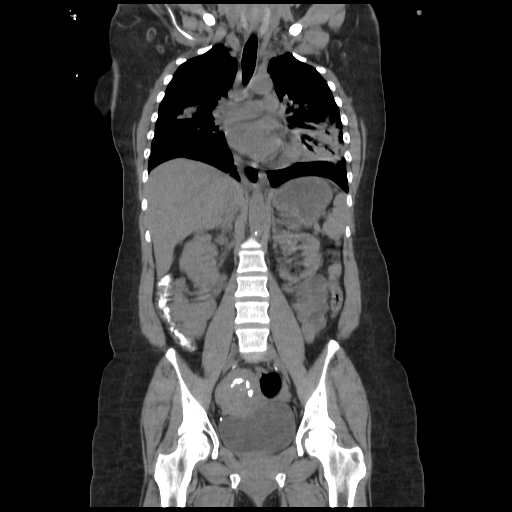
[im 66/92  soft-tissue]
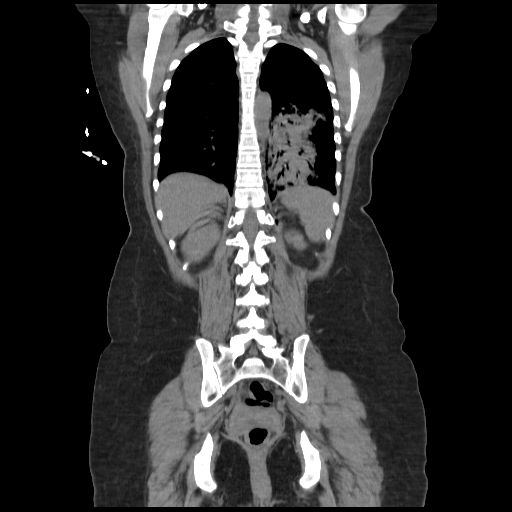

[Series 502: sag · sagittal · 1.23mm/px · 8 of 122 slices shown, 13 images]
[im 14/122  soft-tissue]
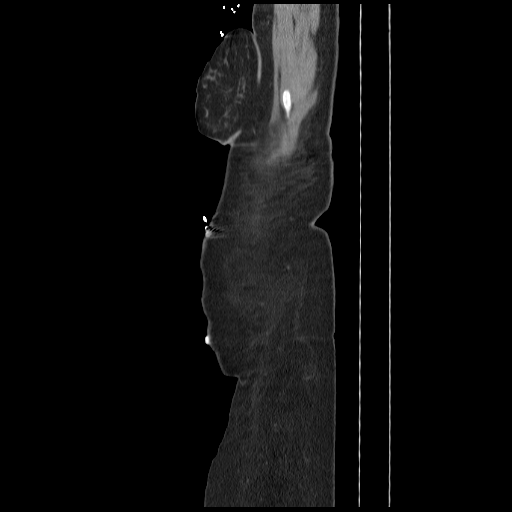
[im 14/122  lung]
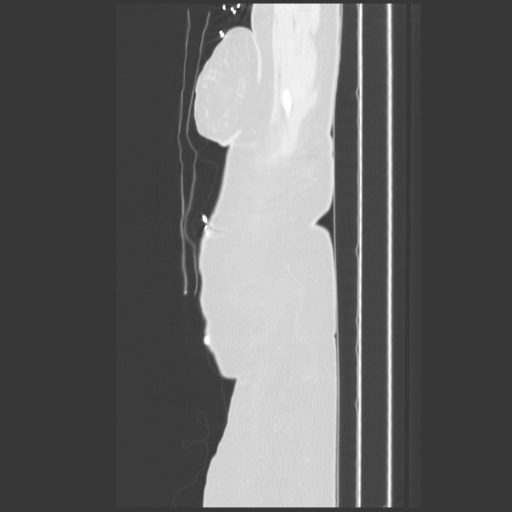
[im 14/122  bone]
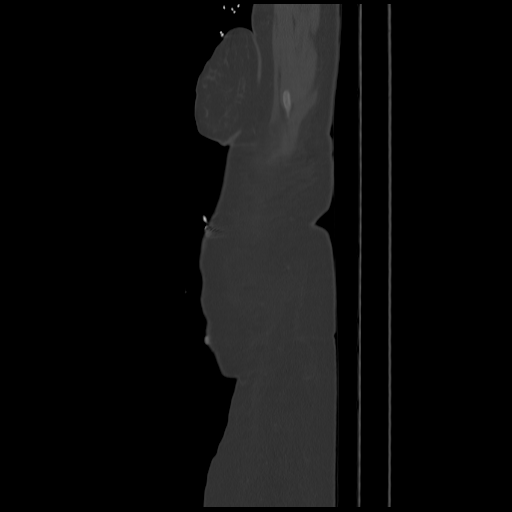
[im 27/122  soft-tissue]
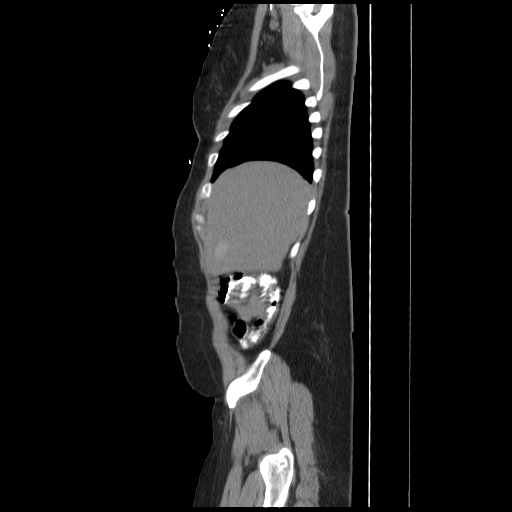
[im 27/122  lung]
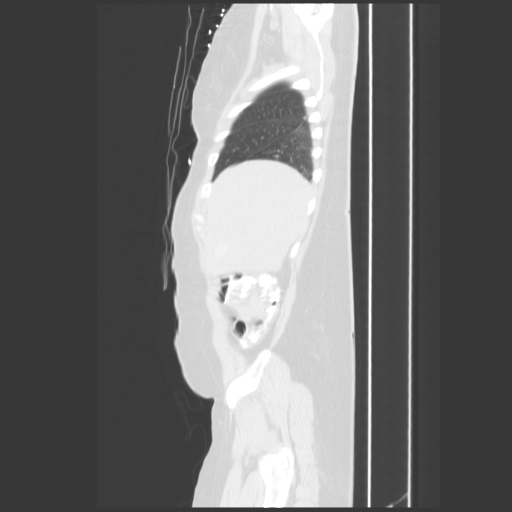
[im 41/122  soft-tissue]
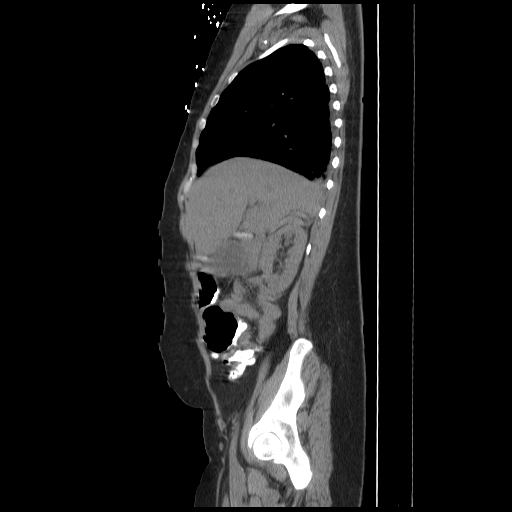
[im 41/122  lung]
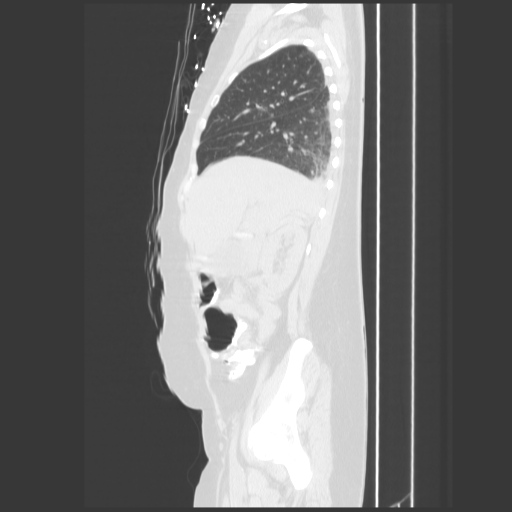
[im 54/122  soft-tissue]
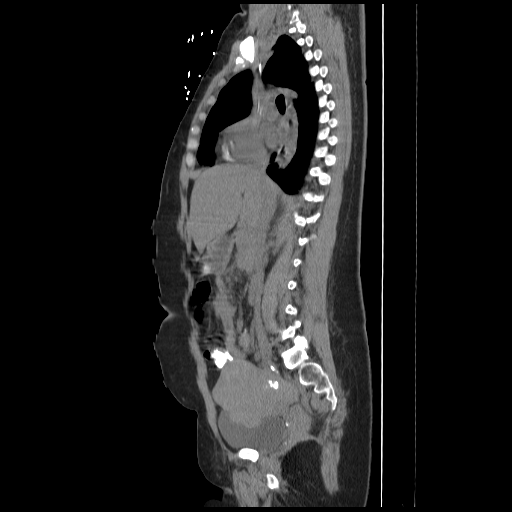
[im 54/122  lung]
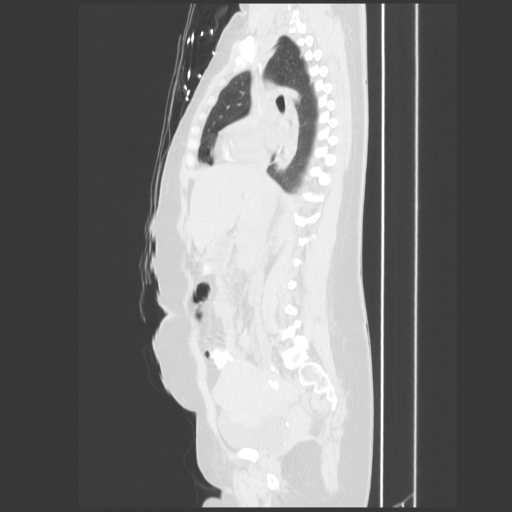
[im 68/122  soft-tissue]
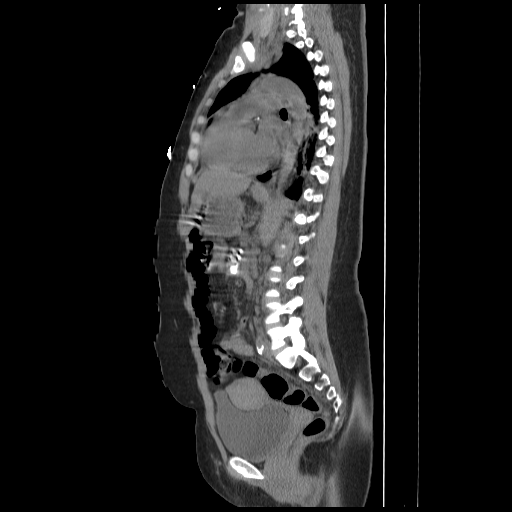
[im 81/122  soft-tissue]
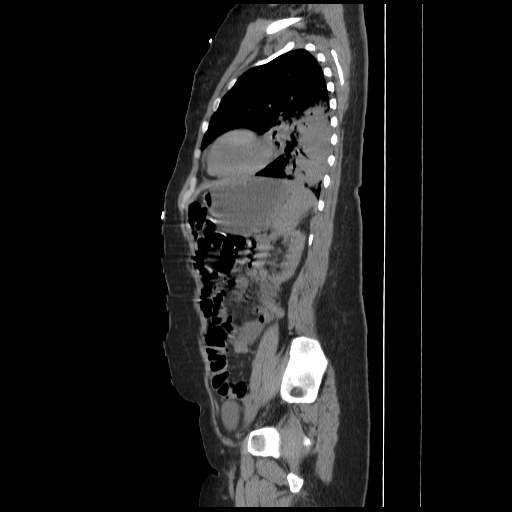
[im 95/122  soft-tissue]
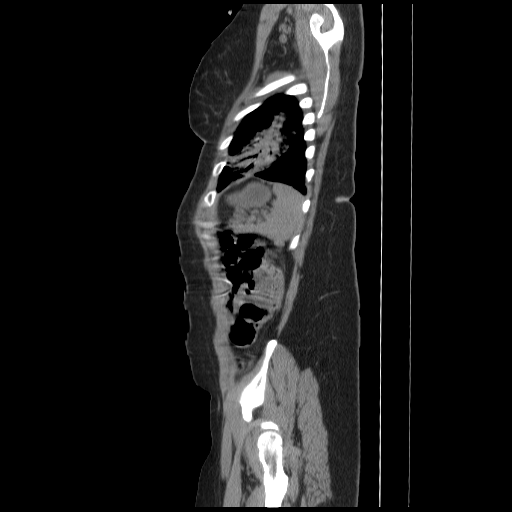
[im 108/122  soft-tissue]
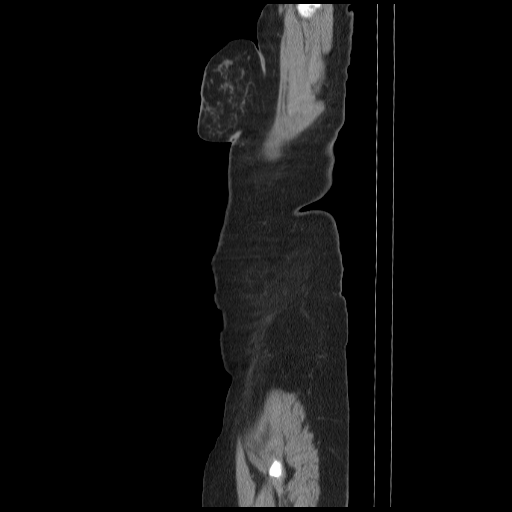

[13 of 32 positions shown; findings below may reference images not displayed]

FINDINGS: No enlarged axillary or supraclavicular lymph nodes.

There is a right paratracheal lymph node which is borderline
enlarged measuring 1.2 cm, image number 20.

No pericardial or pleural effusion.

There is abnormal wall thickening involving the mid and distal
thoracic esophagus.

Dense airspace consolidation is identified involving the lingular
portion of the left upper lobe and the left lower lobe.  Patchy
airspace densities are also noted within the right upper lobe and
right base.

No interstitial edema identified.

Review of the visualized osseous structures shows no acute bony
abnormalities.

There is abnormal wall thickening involving the mid and distal
thoracic esophagus.
IMPRESSION: 1.  Multifocal bilateral airspace consolidation.  This is most
severe in the left lower lobe.  Findings consistent with pneumonia.
2.  Borderline right paratracheal lymph nodes are nonspecific in
the setting of pneumonia.

CT ABDOMEN AND PELVIS
FINDINGS: There has been vicarious excretion of contrast material
into the gallbladder lumen.

No focal liver abnormalities identified.

The spleen appears normal.

Both adrenal glands appear normal.

The pancreas appears normal.

Normal appearance of the kidneys.  No evidence for obstructive
uropathy.

No enlarged upper abdominal lymph nodes.

There is no enlarged pelvic or inguinal lymph nodes.

Urinary bladder appears normal.

There is a enlarged partially calcified fibroid uterus which
measures 14 cm in length.

No free fluid or fluid collections identified.  There is mild wall
thickening involving the gastric antrum.  The stomach is otherwise
normal.  The small bowel loops have a normal caliber.

Colon is negative.

Review of the visualized osseous structures is unremarkable.
IMPRESSION: 1.  Mild wall thickening involving the antrum of the stomach which
may represent a nonspecific gastritis.
2.  Enlarged fibroid uterus.
3.  No free fluid or free intraperitoneal air.

## 2011-09-21 ENCOUNTER — Encounter (HOSPITAL_COMMUNITY)
Admission: RE | Admit: 2011-09-21 | Discharge: 2011-09-21 | Disposition: A | Discharge status: Home / Self Care | Payer: Medicaid Other | Source: Ambulatory Visit | Attending: Interventional Cardiology | Admitting: Interventional Cardiology

## 2011-09-21 ENCOUNTER — Encounter (HOSPITAL_COMMUNITY): Payer: Self-pay

## 2011-09-21 DIAGNOSIS — I252 Old myocardial infarction: Secondary | ICD-10-CM | POA: Insufficient documentation

## 2011-09-21 DIAGNOSIS — I209 Angina pectoris, unspecified: Secondary | ICD-10-CM | POA: Insufficient documentation

## 2011-09-21 DIAGNOSIS — I2510 Atherosclerotic heart disease of native coronary artery without angina pectoris: Secondary | ICD-10-CM | POA: Insufficient documentation

## 2011-09-21 DIAGNOSIS — F17200 Nicotine dependence, unspecified, uncomplicated: Secondary | ICD-10-CM | POA: Insufficient documentation

## 2011-09-21 DIAGNOSIS — I498 Other specified cardiac arrhythmias: Secondary | ICD-10-CM | POA: Insufficient documentation

## 2011-09-21 DIAGNOSIS — Z5189 Encounter for other specified aftercare: Secondary | ICD-10-CM | POA: Insufficient documentation

## 2011-09-21 DIAGNOSIS — E119 Type 2 diabetes mellitus without complications: Secondary | ICD-10-CM | POA: Insufficient documentation

## 2011-09-21 DIAGNOSIS — Z9861 Coronary angioplasty status: Secondary | ICD-10-CM | POA: Insufficient documentation

## 2011-09-21 DIAGNOSIS — Z794 Long term (current) use of insulin: Secondary | ICD-10-CM | POA: Insufficient documentation

## 2011-09-21 DIAGNOSIS — I10 Essential (primary) hypertension: Secondary | ICD-10-CM | POA: Insufficient documentation

## 2011-09-21 DIAGNOSIS — E785 Hyperlipidemia, unspecified: Secondary | ICD-10-CM | POA: Insufficient documentation

## 2011-09-21 NOTE — Progress Notes (Signed)
Cardiac Rehab Medication Review by a Pharmacist  Does the patient  feel that his/her medications are working for him/her?  yes  Has the patient been experiencing any side effects to the medications prescribed?  no  Does the patient measure his/her own blood pressure or blood glucose at home?  yes   Does the patient have any problems obtaining medications due to transportation or finances?   yes  Understanding of regimen: good Understanding of indications: fair Potential of compliance: good    Pharmacist comments: Patient has some complaints of feeling tired lately. Upon further discussion with patient, it may be related to her menstrual cycle. I did recommend a multi-vitamin if the patient would like to start taking.   Thank you,  Brett Fairy, PharmD Pager: [phone]  09/21/2011 8:34 AM

## 2011-09-25 ENCOUNTER — Encounter (HOSPITAL_COMMUNITY)
Admission: RE | Admit: 2011-09-25 | Discharge: 2011-09-25 | Disposition: A | Discharge status: Home / Self Care | Payer: Medicaid Other | Source: Ambulatory Visit | Attending: Interventional Cardiology | Admitting: Interventional Cardiology

## 2011-09-25 ENCOUNTER — Encounter (HOSPITAL_COMMUNITY): Payer: Self-pay

## 2011-09-25 LAB — GLUCOSE, CAPILLARY: Glucose-Capillary: 259 mg/dL — ABNORMAL HIGH (ref 70–99)

## 2011-09-25 NOTE — Progress Notes (Signed)
Pt started cardiac rehab today.  Pt tolerated light exercise without difficulty.  VSS.  Telemetry-NSR.  Pt denies chest pain or dyspnea.  Reviewed pt home medications.  Pt states she uses novolog as sliding scale insulin and only uses if CBG>200.  Pt also states she is currently smoking 1/2ppd.  Pt states she does not have desire to quit at this time, however she admits she is aware of the increased risk for cardiovascular disease progression r/t smoking.  Offered smoking cessation counseling.  Pt oriented to exercise equipment and routine.  Understanding verbalized.  Will continue to monitor the patient throughout  the program.

## 2011-09-27 ENCOUNTER — Encounter (HOSPITAL_COMMUNITY): Payer: Self-pay | Admitting: *Deleted

## 2011-09-27 ENCOUNTER — Encounter (HOSPITAL_COMMUNITY)
Admission: RE | Admit: 2011-09-27 | Discharge: 2011-09-27 | Disposition: A | Discharge status: Home / Self Care | Payer: Medicaid Other | Source: Ambulatory Visit | Attending: Interventional Cardiology | Admitting: Interventional Cardiology

## 2011-09-27 ENCOUNTER — Emergency Department (HOSPITAL_COMMUNITY)
Admission: EM | Admit: 2011-09-27 | Discharge: 2011-09-27 | Disposition: A | Discharge status: Home / Self Care | Payer: Medicaid Other | Attending: Emergency Medicine | Admitting: Emergency Medicine

## 2011-09-27 DIAGNOSIS — D649 Anemia, unspecified: Secondary | ICD-10-CM | POA: Insufficient documentation

## 2011-09-27 DIAGNOSIS — E162 Hypoglycemia, unspecified: Secondary | ICD-10-CM

## 2011-09-27 DIAGNOSIS — F411 Generalized anxiety disorder: Secondary | ICD-10-CM | POA: Insufficient documentation

## 2011-09-27 DIAGNOSIS — F329 Major depressive disorder, single episode, unspecified: Secondary | ICD-10-CM | POA: Insufficient documentation

## 2011-09-27 DIAGNOSIS — M069 Rheumatoid arthritis, unspecified: Secondary | ICD-10-CM | POA: Insufficient documentation

## 2011-09-27 DIAGNOSIS — I2510 Atherosclerotic heart disease of native coronary artery without angina pectoris: Secondary | ICD-10-CM | POA: Insufficient documentation

## 2011-09-27 DIAGNOSIS — Z803 Family history of malignant neoplasm of breast: Secondary | ICD-10-CM | POA: Insufficient documentation

## 2011-09-27 DIAGNOSIS — F3289 Other specified depressive episodes: Secondary | ICD-10-CM | POA: Insufficient documentation

## 2011-09-27 DIAGNOSIS — K219 Gastro-esophageal reflux disease without esophagitis: Secondary | ICD-10-CM | POA: Insufficient documentation

## 2011-09-27 DIAGNOSIS — E1169 Type 2 diabetes mellitus with other specified complication: Secondary | ICD-10-CM | POA: Insufficient documentation

## 2011-09-27 DIAGNOSIS — Z794 Long term (current) use of insulin: Secondary | ICD-10-CM | POA: Insufficient documentation

## 2011-09-27 DIAGNOSIS — E785 Hyperlipidemia, unspecified: Secondary | ICD-10-CM | POA: Insufficient documentation

## 2011-09-27 DIAGNOSIS — I10 Essential (primary) hypertension: Secondary | ICD-10-CM | POA: Insufficient documentation

## 2011-09-27 LAB — GLUCOSE, CAPILLARY
Glucose-Capillary: 39 mg/dL — CL (ref 70–99)
Glucose-Capillary: 50 mg/dL — ABNORMAL LOW (ref 70–99)
Glucose-Capillary: 54 mg/dL — ABNORMAL LOW (ref 70–99)
Glucose-Capillary: 55 mg/dL — ABNORMAL LOW (ref 70–99)
Glucose-Capillary: 81 mg/dL (ref 70–99)

## 2011-09-27 NOTE — ED Provider Notes (Signed)
History     CSN: [id number]  Arrival date & time 09/27/11  4696   First MD Initiated Contact with Patient 09/27/11 1010      Chief Complaint  Patient presents with  . Hypoglycemia    (Consider location/radiation/quality/duration/timing/severity/associated sxs/prior treatment) HPI Comments: Taimane, a 46 year old woman with history of insulin dependent diabetes mellitus, was brought to the ED from the cardiac rehab gym (within [facility] [place]) with a reportedly low finger stick blood glucose. Novelle took her normal insulin dosage of 40 lantus and had a breakfast consisting of orange juice. At the gym, her blood glucose read at 59. They gave her glucose gel, checked her insulin again and it still read low, so they sent her, by wheelchair, to the ED. Zykia denies having symptoms of hypoglycemia (weakness, sweating, nausea, odd behavior). Her blood sugar on arrival to the ED was 81. She has since eaten a sandwich, feels fine, and wants to go home.    Past Medical History  Diagnosis Date  . Diabetes mellitus   . Hypertension   . CAD (coronary artery disease)   . Sebaceous cyst   . Anemia   . Allergic rhinitis   . Hypertension   . Rheumatoid arthritis   . Diabetic retinopathy   . Acute osteomyelitis   . Esophageal ulcer   . Esophageal stenosis   . Gastroparesis   . Renal insufficiency   . Diabetes mellitus   . GERD (gastroesophageal reflux disease)   . Hyperlipidemia   . Depression   . Anxiety   . Heart attack   . Polysubstance abuse   . Esophagitis     Past Surgical History  Procedure Date  . Cardiac catheterization   . Coronary stent placement 12/2008    Family History  Problem Relation Age of Onset  . Breast cancer Mother 63  . Hypertension Father   . Stomach cancer Sister   . Hypertension Sister   . Colon cancer Neg Hx     History  Substance Use Topics  . Smoking status: Current Everyday Smoker -- 0.5 packs/day    Types: Cigarettes  . Smokeless tobacco: Never  Used  . Alcohol Use: No    OB History    Grav Para Term Preterm Abortions TAB SAB Ect Mult Living                  Review of Systems  Constitutional: Negative for chills and diaphoresis.  Respiratory: Negative for cough and shortness of breath.   Cardiovascular: Negative for chest pain.  Gastrointestinal: Negative for nausea, vomiting and abdominal pain.  Neurological: Negative for dizziness, tremors, syncope, speech difficulty, weakness and headaches.  Psychiatric/Behavioral: Negative for confusion and agitation.    Allergies  Aspirin and Codeine  Home Medications   Current Outpatient Rx  Name Route Sig Dispense Refill  . CLOPIDOGREL BISULFATE 75 MG PO TABS Oral Take 75 mg by mouth daily.      Marland Kitchen FERROUS SULFATE 325 (65 FE) MG PO TABS Oral Take 325 mg by mouth daily with breakfast.      . FOLIC ACID 1 MG PO TABS Oral Take 1 mg by mouth daily.      . INSULIN ASPART PROT & ASPART (70-30) 100 UNIT/ML [place] SUSP Subcutaneous Inject 4-20 Units into the skin daily as needed. Sliding scale    . INSULIN GLARGINE 100 UNIT/ML [place] SOLN Subcutaneous Inject 40 Units into the skin every morning.     Marland Kitchen METHOTREXATE 2.5 MG PO  TABS Oral Take 15 mg by mouth once a week. Wednesday. Caution:Chemotherapy. Protect from light.     Marland Kitchen METOCLOPRAMIDE HCL 10 MG PO TABS Oral Take 10 mg by mouth 4 (four) times daily.      Marland Kitchen METOPROLOL TARTRATE 25 MG PO TABS Oral Take 25 mg by mouth 2 (two) times daily.      Marland Kitchen PANTOPRAZOLE SODIUM 40 MG PO TBEC Oral Take 40 mg by mouth daily at 6 (six) AM.    . PRAVASTATIN SODIUM 80 MG PO TABS Oral Take 80 mg by mouth daily.      . SERTRALINE HCL 100 MG PO TABS Oral Take 100 mg by mouth daily.      . TRAMADOL HCL 50 MG PO TABS Oral Take 50 mg by mouth every 6 (six) hours as needed. For pain    . NITROGLYCERIN 0.4 MG SL SUBL Sublingual Place 0.4 mg under the tongue every 5 (five) minutes as needed. For chest pain      BP 118/58  Pulse 85  Temp(Src) 98.2 F (36.8 C) (Oral)   Resp 16  SpO2 100%  Physical Exam  Constitutional: She is oriented to person, place, and time. She appears well-developed and well-nourished. No distress.  Cardiovascular: Normal rate, regular rhythm and normal heart sounds.  Exam reveals no gallop and no friction rub.   No murmur heard. Pulmonary/Chest: Effort normal and breath sounds normal. No respiratory distress. She has no wheezes. She has no rales.  Neurological: She is alert and oriented to person, place, and time.  Skin: Skin is warm and dry. She is not diaphoretic.  Psychiatric: She has a normal mood and affect. Her behavior is normal.    ED Course  Procedures (including critical care time)  The patient has eaten and is having no ill effects here in the ER. The patient wants to leave.        MDM          Carlyle Dolly, PA-C 09/27/11 1058

## 2011-09-27 NOTE — Progress Notes (Signed)
Nutrition Note Consult re: hypoglycemia.  Pt well-known to this Clinical research associate from previous admission.  Pt reports CBG at 530 am 68 mg/dL.  Pt took 4 glucose tablets, drank approximately 12 ounces of juice, and ate a small bowl (~1 cup) of frosted flakes. Healthier pre-exercise food choices discussed. Pt encouraged to consume some protein with breakfast (e.g. Peanut butter and banana sandwich or cheese sandwich on whole wheat bread). Pt took her step sons dog for a walk and CBG before leaving home at 730 was 97 mg/dL and pt took 40 units of Lantus. Pre-exercise CBG 50 mg/dL.  Pt given a glucose gel and lemonade.  20 minutes after CBG was 55 mg/dL in the right hand and 54 mg/dL in the left hand. This Clinical research associate ? Pt if she may have mixed up her insulins and accidentally given herself 40 units of Novolog.  Pt states, "I don't think so." ? Pt's ability to answer ?'s appropriately given pt appeared confused, unable to read, and pt changing the facts re: this mornings events several times per discussion with Deveron Furlong, RN. Pt given another glucose gel and gingerale for hypoglycemia and approximately 25 minutes later, pt CBG 39 mg/dL. Deveron Furlong, RN called pt cardiologist and pt was sent to the ED. Continue client-centered nutrition education by RD as part of interdisciplinary care.  Monitor and evaluate progress toward nutrition goal with team.

## 2011-09-27 NOTE — Discharge Instructions (Signed)
Return here as needed. Follow up with your doctor. °

## 2011-09-27 NOTE — Progress Notes (Signed)
834 am Pt CBG- 50 upon arrival to cardiac rehab today.  At 835am-1 tube glucose gel given.  Pt also given lemonade.  855am CBG- 55, 857am CBG-54 (recheck to confirm accuracy)  858 pt given 1 tube glucose gel.  Pt given gingerale to drink.  Pt denies sx of hypoglycemia.   924am CBG-39, 927 recheck CBG-39.  Pt transferred to emergency room.  Dr. Hoyle Barr office made aware.   Pt states she took lantus 40mg  this am, with initial CBG at home 68 around 5am and recheck 97 prior to coming to cardiac rehab. -jr,rn

## 2011-09-27 NOTE — ED Provider Notes (Signed)
Medical screening examination/treatment/procedure(s) were performed by non-physician practitioner and as supervising physician I was immediately available for consultation/collaboration.   Eniola Cerullo A Chico Cawood, MD 09/27/11 1645 

## 2011-09-27 NOTE — Progress Notes (Signed)
[name] [name] 46 y.o. female       Nutrition Screen                                                                    YES  NO Do you live in a nursing home?  X   Do you eat out more than 3 times/week?    X If yes, how many times per week do you eat out?  Do you have food allergies?   X If yes, what are you allergic to?  Have you gained or lost more than 10 lbs without trying?              X  If yes, how much weight have you lost and over what time period? 20 lbs gained over month  Do you want to lose weight?    X  If yes, what is a goal weight or amount of weight you would like to lose? 145-150 lb  Do you eat alone most of the time?   X   Do you eat less than 2 meals/day?  X If yes, how many meals do you eat?  Do you drink more than 3 alcohol drinks/day?  X If yes, how many drinks per day?  Are you having trouble with constipation? *  X If yes, what are you doing to help relieve constipation?  Do you have financial difficulties with buying food?*    X   Are you experiencing regular nausea/ vomiting?*     X   Do you have a poor appetite? *                                        X   Do you have trouble chewing/swallowing? *   X    Pt with diagnoses of:  X Stent/ PTCA X GERD          X %  Body fat >goal / Body Mass Index >25 X HTN / BP >120/80 X MI       X DM/A1c >6 / CBG >126       Pt Risk Score   2       Diagnosis Risk Score  75       Total Risk Score   77                        X Capelle Risk                Low Risk    HT: 62.5" Ht Readings from Last 1 Encounters:  09/21/11 5' 2.5" (1.588 m)    WT:   171.2 lb (77.8 kg) Wt Readings from Last 3 Encounters:  09/21/11 171 lb 8.3 oz (77.8 kg)  07/07/11 160 lb 11.5 oz (72.9 kg)  07/07/11 160 lb 11.5 oz (72.9 kg)     IBW 51.1 152%IBW BMI 30.9 39.2%body fat  Meds reviewed: Ferrous Sulfate, Folvite, Novolog 70/30 4-20 units prn, Lantus 40 units q am, Reglan Past Medical History  Diagnosis Date  . Diabetes mellitus   . Hypertension     . CAD (coronary artery  disease)   . Sebaceous cyst   . Anemia   . Allergic rhinitis   . Hypertension   . Rheumatoid arthritis   . Diabetic retinopathy   . Acute osteomyelitis   . Esophageal ulcer   . Esophageal stenosis   . Gastroparesis   . Renal insufficiency   . Diabetes mellitus   . GERD (gastroesophageal reflux disease)   . Hyperlipidemia   . Depression   . Anxiety   . Heart attack   . Polysubstance abuse   . Esophagitis         Activity level: Pt is sedentary    Wt goal: 147-159 lb ( 66.8-72.3 kg) Current tobacco use? Yes If pt using tobacco, is pt taking a vit C? No  Food/Drug Interaction? No      Labs:  Lipid Panel     Component Value Date/Time   CHOL  Value: 184        ATP III CLASSIFICATION:  <200     mg/dL   Desirable  409-811  mg/dL   Borderline [place]  >=914    mg/dL   [place]        02/05/2955 0645   TRIG 52 09/05/2010 0645   HDL 67 09/05/2010 0645   CHOLHDL 2.7 09/05/2010 0645   VLDL 10 09/05/2010 0645   LDLCALC  Value: 107        Total Cholesterol/HDL:CHD Risk Coronary Heart Disease Risk Table                     Men   Women  1/2 Average Risk   3.4   3.3  Average Risk       5.0   4.4  2 X Average Risk   9.6   7.1  3 X Average Risk  23.4   11.0        Use the calculated Patient Ratio above and the CHD Risk Table to determine the patient's CHD Risk.        ATP III CLASSIFICATION (LDL):  <100     mg/dL   Optimal  213-086  mg/dL   Near or Above                    Optimal  130-159  mg/dL   Borderline  578-469  mg/dL   Kem  >629     mg/dL   Very [place]* 11/30/8411 2440    Lab Results  Component Value Date   HGBA1C 7.0* 02/10/2011   LDL goal: < 70      MI, DM and > 2:      HTN, tobacco Estimated Daily Nutrition Needs for: ? wt loss  1350-1600 Kcal , Total Fat 30-40 gm, Saturated Fat 8-12 gm, Trans Fat 1-1.6 gm,  Sodium less than 1500 mg, Grams of CHO 175

## 2011-09-27 NOTE — ED Notes (Signed)
Went to [facility] today, reports feeling fine, cbg was 40, given glucose gel x2 with juice and cbg was still 39. cbg is 81 at triage, pt reports feeling slightly lightheaded, took 40units lantus this am 0730 but did not eat.

## 2011-09-28 LAB — GLUCOSE, CAPILLARY: Glucose-Capillary: 39 mg/dL — CL (ref 70–99)

## 2011-09-29 ENCOUNTER — Encounter (HOSPITAL_COMMUNITY): Payer: Medicaid Other

## 2011-09-29 ENCOUNTER — Telehealth (HOSPITAL_COMMUNITY): Payer: Self-pay | Admitting: Cardiac Rehabilitation

## 2011-09-29 MED FILL — Glucose Gel 40%: ORAL | Qty: 1.65 | Status: AC

## 2011-09-29 MED FILL — Glucose Gel 40%: ORAL | Qty: 0.83 | Status: AC

## 2011-09-29 NOTE — Telephone Encounter (Signed)
pc received from pt reporting hypoglycemia this am.  At 530am CBG- 263 took Lantus 40units.  Rechecked at 720  CBG-39.  Took 4 glucose tablets, glass OJ, ate grapefruit, moonpie.  At 815, recheck CBG- 145.    Pt reports seeing "blue spots", associated with 39 reading.   Pt advised to contact PCP today for evaluation.  Understanding verbalized -jr,rn

## 2011-10-02 ENCOUNTER — Encounter (HOSPITAL_COMMUNITY): Admission: RE | Admit: 2011-10-02 | Payer: Medicaid Other | Source: Ambulatory Visit

## 2011-10-02 ENCOUNTER — Telehealth (HOSPITAL_COMMUNITY): Payer: Self-pay | Admitting: Cardiac Rehabilitation

## 2011-10-02 NOTE — Telephone Encounter (Signed)
pc received from pt stating she spoke to PCP office scheduled appt to be seen next week.  Pt states she was advised per PCP RN to decrease lantus dosage according to CBG readings, 30units for CBG-200-249, 35 units 250-299, 40 units for CBG >300.  Pt states she verbalized understanding as directed by her PCP.  -jrion,rn

## 2011-10-04 ENCOUNTER — Encounter (HOSPITAL_COMMUNITY)
Admission: RE | Admit: 2011-10-04 | Discharge: 2011-10-04 | Disposition: A | Discharge status: Home / Self Care | Payer: Medicaid Other | Source: Ambulatory Visit | Attending: Interventional Cardiology | Admitting: Interventional Cardiology

## 2011-10-04 DIAGNOSIS — Z9861 Coronary angioplasty status: Secondary | ICD-10-CM | POA: Insufficient documentation

## 2011-10-04 DIAGNOSIS — I10 Essential (primary) hypertension: Secondary | ICD-10-CM | POA: Insufficient documentation

## 2011-10-04 DIAGNOSIS — E119 Type 2 diabetes mellitus without complications: Secondary | ICD-10-CM | POA: Insufficient documentation

## 2011-10-04 DIAGNOSIS — E785 Hyperlipidemia, unspecified: Secondary | ICD-10-CM | POA: Insufficient documentation

## 2011-10-04 DIAGNOSIS — I209 Angina pectoris, unspecified: Secondary | ICD-10-CM | POA: Insufficient documentation

## 2011-10-04 DIAGNOSIS — I498 Other specified cardiac arrhythmias: Secondary | ICD-10-CM | POA: Insufficient documentation

## 2011-10-04 DIAGNOSIS — I2510 Atherosclerotic heart disease of native coronary artery without angina pectoris: Secondary | ICD-10-CM | POA: Insufficient documentation

## 2011-10-04 DIAGNOSIS — Z5189 Encounter for other specified aftercare: Secondary | ICD-10-CM | POA: Insufficient documentation

## 2011-10-04 DIAGNOSIS — Z794 Long term (current) use of insulin: Secondary | ICD-10-CM | POA: Insufficient documentation

## 2011-10-04 DIAGNOSIS — I252 Old myocardial infarction: Secondary | ICD-10-CM | POA: Insufficient documentation

## 2011-10-04 DIAGNOSIS — F17200 Nicotine dependence, unspecified, uncomplicated: Secondary | ICD-10-CM | POA: Insufficient documentation

## 2011-10-04 LAB — GLUCOSE, CAPILLARY
Glucose-Capillary: 95 mg/dL (ref 70–99)
Glucose-Capillary: 97 mg/dL (ref 70–99)

## 2011-10-04 NOTE — Progress Notes (Addendum)
CBG-97 upon arrival to cardiac rehab.  Pt given lemonade and peanut butter crackers. Recheck 20 minutes later, 95.   Pt reports she took lantus 30 units at 605am for CBG- 120.  ate cereal and orange at that time and then ate cereal at 720.  Pt did not participate in exercise today. Mickle Plumb, RD met with pt and advised her to take lantus 20units on Friday in preparation for exercise.  Pt also instructed to bring insulin pens with her to next rehab session.   Understanding verbalized-jr,rn

## 2011-10-06 ENCOUNTER — Encounter (HOSPITAL_COMMUNITY)
Admission: RE | Admit: 2011-10-06 | Discharge: 2011-10-06 | Disposition: A | Discharge status: Home / Self Care | Payer: Medicaid Other | Source: Ambulatory Visit | Attending: Interventional Cardiology | Admitting: Interventional Cardiology

## 2011-10-06 LAB — GLUCOSE, CAPILLARY: Glucose-Capillary: 80 mg/dL (ref 70–99)

## 2011-10-06 NOTE — Progress Notes (Signed)
Pt arrived at cardiac rehab 824am CBG-80.  Pt given 30gm carbohydrate drink and 15gm glucose gel. Recheck 845am-125.  Pt will exercise today.  Pt reports her home CBG- 226 at 6am, she took Lantus 20units.  At 730am, recheck CBG-95, she ate sausage biscuit and brownie with M&Ms. .  Pt states she will not take insulin prior to class unless her home CBG->300.  Pt did not bring insulin pens as advised.  Pt states she will bring them with her to class on Monday. Understanding verbalized-jrion,rn

## 2011-10-09 ENCOUNTER — Encounter (HOSPITAL_COMMUNITY): Admission: RE | Admit: 2011-10-09 | Payer: Medicaid Other | Source: Ambulatory Visit

## 2011-10-10 ENCOUNTER — Telehealth (HOSPITAL_COMMUNITY): Payer: Self-pay | Admitting: Cardiac Rehabilitation

## 2011-10-11 ENCOUNTER — Encounter (HOSPITAL_COMMUNITY)
Admission: RE | Admit: 2011-10-11 | Discharge: 2011-10-11 | Disposition: A | Discharge status: Home / Self Care | Payer: Medicaid Other | Source: Ambulatory Visit | Attending: Interventional Cardiology | Admitting: Interventional Cardiology

## 2011-10-11 LAB — GLUCOSE, CAPILLARY: Glucose-Capillary: 112 mg/dL — ABNORMAL HIGH (ref 70–99)

## 2011-10-11 NOTE — Progress Notes (Signed)
Pt arrived to cardiac rehab CBG- 112.  Pt states CBG-299 at home.  Pt states she took insulin however reports she did not eat. Pt met with dietician, importance of eating when taking insulin reinforced.  Pt advised she must be compliant with eating and medications in order to continue participation in rehab.

## 2011-10-13 ENCOUNTER — Encounter (HOSPITAL_COMMUNITY): Payer: Medicaid Other

## 2011-10-16 ENCOUNTER — Encounter (HOSPITAL_COMMUNITY): Payer: Medicaid Other

## 2011-10-16 ENCOUNTER — Encounter (HOSPITAL_COMMUNITY)
Admission: RE | Admit: 2011-10-16 | Discharge: 2011-10-16 | Disposition: A | Discharge status: Home / Self Care | Payer: Medicaid Other | Source: Ambulatory Visit | Attending: Interventional Cardiology | Admitting: Interventional Cardiology

## 2011-10-16 NOTE — Progress Notes (Signed)
Pt arrived to cardiac rehab at 115pm.  CBG- 87.  Pt reports her CBG at noon was 125, she ate tuna sandwich.  Pt given tang to drink.  Pt does not want to stay at rehab today and await cbg to come up enough to exercise.  Pt requests to await return to cardiac rehab until after endocrinology appt 11/02/2011.  Pt also requests to change appt time to either 945 or 1115.  Will discuss class times with pt when she returns

## 2011-10-18 ENCOUNTER — Encounter (HOSPITAL_COMMUNITY): Payer: Medicaid Other

## 2011-10-20 ENCOUNTER — Encounter (HOSPITAL_COMMUNITY): Payer: Medicaid Other

## 2011-10-23 ENCOUNTER — Encounter (HOSPITAL_COMMUNITY): Payer: Medicaid Other

## 2011-10-25 ENCOUNTER — Encounter (HOSPITAL_COMMUNITY): Payer: Medicaid Other

## 2011-10-27 ENCOUNTER — Encounter (HOSPITAL_COMMUNITY): Payer: Medicaid Other

## 2011-10-30 ENCOUNTER — Encounter (HOSPITAL_COMMUNITY): Payer: Medicaid Other

## 2011-11-01 ENCOUNTER — Encounter (HOSPITAL_COMMUNITY): Payer: Medicaid Other

## 2011-11-03 ENCOUNTER — Encounter (HOSPITAL_COMMUNITY): Payer: Medicaid Other

## 2011-11-03 ENCOUNTER — Telehealth (HOSPITAL_COMMUNITY): Payer: Self-pay | Admitting: Cardiac Rehabilitation

## 2011-11-03 NOTE — Telephone Encounter (Addendum)
Pt called to report medication changes made by endocrinologist, Dr. Sharl Ma.  Lantus 14units BID and Novolog 8units ac if CBG >150.  Pt was also instructed to check CBG 7 times daily for 3 days.  Pt reports her CBG at 6am-332, 8am-314, pt took lantus 14 units at 6am, took novolog 8units at breakfast, ate 3 hard boiled eggs, 10am-115.  Pt advised to continue current regimen.  She was advised to report her CBG readings to cardiac rehab on Monday to assess ability to return to exercise.  Understanding verbalized. Pt states she has appt with outpatient diabetic dietician 11/09/2011.  Will continue to monitor.

## 2011-11-06 ENCOUNTER — Telehealth (HOSPITAL_COMMUNITY): Payer: Self-pay | Admitting: Cardiac Rehabilitation

## 2011-11-06 ENCOUNTER — Encounter (HOSPITAL_COMMUNITY): Payer: Medicaid Other

## 2011-11-08 ENCOUNTER — Encounter (HOSPITAL_COMMUNITY): Payer: Medicaid Other

## 2011-11-08 ENCOUNTER — Telehealth (HOSPITAL_COMMUNITY): Payer: Self-pay | Admitting: Cardiac Rehabilitation

## 2011-11-08 NOTE — Telephone Encounter (Signed)
pc to pt to assess dental symptoms and CBG readings.  Pt reports she went to dentist, received antibiotic and her discomfort is relieved. Pt also reports improvement in CBG readings today.  Pt will continue current regimen. Pt advised to withhold from cardiac rehab on Wednesday, call to report her sx and CBG readings on Thursday/Friday to assess ability to return to rehab.  Understanding verbalized

## 2011-11-09 ENCOUNTER — Emergency Department (HOSPITAL_COMMUNITY)
Admission: EM | Admit: 2011-11-09 | Discharge: 2011-11-10 | Disposition: A | Discharge status: Home / Self Care | Payer: Medicaid Other | Attending: Emergency Medicine | Admitting: Emergency Medicine

## 2011-11-09 ENCOUNTER — Encounter (HOSPITAL_COMMUNITY): Payer: Self-pay | Admitting: *Deleted

## 2011-11-09 DIAGNOSIS — F17200 Nicotine dependence, unspecified, uncomplicated: Secondary | ICD-10-CM | POA: Insufficient documentation

## 2011-11-09 DIAGNOSIS — Z794 Long term (current) use of insulin: Secondary | ICD-10-CM | POA: Insufficient documentation

## 2011-11-09 DIAGNOSIS — E785 Hyperlipidemia, unspecified: Secondary | ICD-10-CM | POA: Insufficient documentation

## 2011-11-09 DIAGNOSIS — M069 Rheumatoid arthritis, unspecified: Secondary | ICD-10-CM | POA: Insufficient documentation

## 2011-11-09 DIAGNOSIS — IMO0002 Reserved for concepts with insufficient information to code with codable children: Secondary | ICD-10-CM | POA: Insufficient documentation

## 2011-11-09 DIAGNOSIS — I2510 Atherosclerotic heart disease of native coronary artery without angina pectoris: Secondary | ICD-10-CM | POA: Insufficient documentation

## 2011-11-09 DIAGNOSIS — Z79899 Other long term (current) drug therapy: Secondary | ICD-10-CM | POA: Insufficient documentation

## 2011-11-09 DIAGNOSIS — I10 Essential (primary) hypertension: Secondary | ICD-10-CM | POA: Insufficient documentation

## 2011-11-09 DIAGNOSIS — I252 Old myocardial infarction: Secondary | ICD-10-CM | POA: Insufficient documentation

## 2011-11-09 DIAGNOSIS — Y849 Medical procedure, unspecified as the cause of abnormal reaction of the patient, or of later complication, without mention of misadventure at the time of the procedure: Secondary | ICD-10-CM | POA: Insufficient documentation

## 2011-11-09 DIAGNOSIS — F341 Dysthymic disorder: Secondary | ICD-10-CM | POA: Insufficient documentation

## 2011-11-09 DIAGNOSIS — K219 Gastro-esophageal reflux disease without esophagitis: Secondary | ICD-10-CM | POA: Insufficient documentation

## 2011-11-09 MED ORDER — OXYCODONE-ACETAMINOPHEN 5-325 MG PO TABS
ORAL_TABLET | ORAL | Status: AC
  Administered 2011-11-09: 1 via ORAL
  Filled 2011-11-09: qty 1

## 2011-11-09 MED ORDER — OXYCODONE-ACETAMINOPHEN 5-325 MG PO TABS
1.0000 | ORAL_TABLET | Freq: Once | ORAL | Status: AC
  Administered 2011-11-09: 1 via ORAL

## 2011-11-09 MED ORDER — LIDOCAINE-EPINEPHRINE 1 %-1:100000 IJ SOLN
10.0000 mL | Freq: Once | INTRAMUSCULAR | Status: AC
  Administered 2011-11-09: 1 mL
  Filled 2011-11-09: qty 1

## 2011-11-09 MED ORDER — LIDOCAINE-EPINEPHRINE 1 %-1:100000 IJ SOLN
INTRAMUSCULAR | Status: AC
  Filled 2011-11-09: qty 1

## 2011-11-09 NOTE — ED Notes (Signed)
Paged Dr. Norwood Levo to [phone]

## 2011-11-09 NOTE — ED Provider Notes (Signed)
History     CSN: [id number]  Arrival date & time 11/09/11  1842   First MD Initiated Contact with Patient 11/09/11 1947      Chief Complaint  Patient presents with  . Dental Problem     The history is provided by the patient.  dental bleeding Onset - today Course - worsening Improved by - nothing Worsened by - nothing Associated symptoms - nausea  Pt presents after dental extraction - pt had tooth extraction earlier today and it hs been bleeding since.  No dizziness reported.  No vomiting reported.  She has tried pressure on the extraction site without relief.    Past Medical History  Diagnosis Date  . Diabetes mellitus   . Hypertension   . CAD (coronary artery disease)   . Sebaceous cyst   . Anemia   . Allergic rhinitis   . Hypertension   . Rheumatoid arthritis   . Diabetic retinopathy   . Acute osteomyelitis   . Esophageal ulcer   . Esophageal stenosis   . Gastroparesis   . Renal insufficiency   . Diabetes mellitus   . GERD (gastroesophageal reflux disease)   . Hyperlipidemia   . Depression   . Anxiety   . Heart attack   . Polysubstance abuse   . Esophagitis     Past Surgical History  Procedure Date  . Cardiac catheterization   . Coronary stent placement 12/2008    Family History  Problem Relation Age of Onset  . Breast cancer Mother 79  . Hypertension Father   . Stomach cancer Sister   . Hypertension Sister   . Colon cancer Neg Hx     History  Substance Use Topics  . Smoking status: Current Everyday Smoker -- 0.5 packs/day    Types: Cigarettes  . Smokeless tobacco: Never Used  . Alcohol Use: No    OB History    Grav Para Term Preterm Abortions TAB SAB Ect Mult Living                  Review of Systems  Constitutional: Negative for fever.  Gastrointestinal: Negative for vomiting.    Allergies  Aspirin and Codeine  Home Medications   Current Outpatient Rx  Name Route Sig Dispense Refill  . AMOXICILLIN 500 MG PO CAPS Oral  Take 500 mg by mouth 3 (three) times daily.    . ATORVASTATIN CALCIUM 40 MG PO TABS Oral Take 40 mg by mouth every morning.    Marland Kitchen CLOPIDOGREL BISULFATE 75 MG PO TABS Oral Take 75 mg by mouth daily.      Marland Kitchen FERROUS SULFATE 325 (65 FE) MG PO TABS Oral Take 325 mg by mouth daily with breakfast.      . FOLIC ACID 1 MG PO TABS Oral Take 1 mg by mouth daily.      . INSULIN ASPART PROT & ASPART (70-30) 100 UNIT/ML Rachel SUSP Subcutaneous Inject 4-20 Units into the skin 2 (two) times daily. Sliding scale as directed    . INSULIN GLARGINE 100 UNIT/ML [place] SOLN Subcutaneous Inject 14 Units into the skin 2 (two) times daily.     Marland Kitchen METHOTREXATE 2.5 MG PO TABS Oral Take 15 mg by mouth once a week. Wednesday. Caution:Chemotherapy. Protect from light.     Marland Kitchen METOCLOPRAMIDE HCL 10 MG PO TABS Oral Take 10 mg by mouth 4 (four) times daily.      Marland Kitchen METOPROLOL TARTRATE 25 MG PO TABS Oral Take 25 mg by mouth 2 (  two) times daily.      Marland Kitchen NITROGLYCERIN 0.4 MG SL SUBL Sublingual Place 0.4 mg under the tongue every 5 (five) minutes as needed. For chest pain    . PANTOPRAZOLE SODIUM 40 MG PO TBEC Oral Take 40 mg by mouth daily at 6 (six) AM.    . SERTRALINE HCL 100 MG PO TABS Oral Take 100 mg by mouth daily.      . TRAMADOL HCL 50 MG PO TABS Oral Take 50 mg by mouth every 6 (six) hours as needed. For pain      BP 123/73  Pulse 79  Temp(Src) 97.2 F (36.2 C) (Axillary)  Resp 16  SpO2 97%  Physical Exam CONSTITUTIONAL: Well developed/well nourished HEAD AND FACE: Normocephalic/atraumatic EYES: EOMI/PERRL ENMT: Mucous membranes moist.  Poor dentition.  Multiple missing teeth.  Right upper maxillary tooth extraction site has small amt of ooze of blood.   NECK: supple no meningeal signs SPINE:entire spine nontender CV: S1/S2 noted, no murmurs/rubs/gallops noted LUNGS: Lungs are clear to auscultation bilaterally, no apparent distress ABDOMEN: soft, nontender, no rebound or guarding NEURO: Pt is awake/alert, moves all  extremitiesx4 EXTREMITIES: pulses normal, full ROM SKIN: warm, color normal PSYCH: no abnormalities of mood noted  ED Course  Dental Date/Time: 11/09/2011 11:50 PM Performed by: Joya Gaskins Authorized by: Joya Gaskins Consent: Verbal consent obtained. Patient tolerance: Patient tolerated the procedure well with no immediate complications. Comments: I placed lidocaine/epinephrine in the socket without any success with blood oozing Multiple attempts at packing with pressure unsuccessful Also packed with gel foam without success Also unable to suture the socket  NERVE BLOCK Performed by: Joya Gaskins Authorized by: Joya Gaskins Consent: Verbal consent obtained. Time out: Immediately prior to procedure a "time out" was called to verify the correct patient, procedure, equipment, support staff and site/side marked as required. Indications: pain relief Body area: face/mouth (supraperiosteal nerve) Needle gauge: 24 G Local anesthetic: lidocaine 1% with epinephrine Anesthetic total: 2 ml Outcome: pain unchanged Patient tolerance: Patient tolerated the procedure well with no immediate complications.    11:57 PM Unable to control bleeding in ED Will check labs I spoke to on call dentist (benitez) and he will see in the ER      MDM  Nursing notes reviewed and considered in documentation         Joya Gaskins, MD 11/10/11 0006

## 2011-11-09 NOTE — ED Notes (Signed)
Gave pt yaunker for bleeding

## 2011-11-09 NOTE — ED Notes (Signed)
Patient had a tooth removed at 1500.  Pt got home and changed the gauze at 1600 and has had bleeding to same since.  Pt does have small amount of bleeding noted.  Pt is spitting in cup.

## 2011-11-09 NOTE — ED Notes (Signed)
Paged Dr. Ether Griffins to [phone]

## 2011-11-10 ENCOUNTER — Encounter (HOSPITAL_COMMUNITY): Payer: Medicaid Other

## 2011-11-10 ENCOUNTER — Telehealth (HOSPITAL_COMMUNITY): Payer: Self-pay | Admitting: Cardiac Rehabilitation

## 2011-11-10 LAB — DIFFERENTIAL
Eosinophils Absolute: 0.1 10*3/uL (ref 0.0–0.7)
Lymphs Abs: 2.1 10*3/uL (ref 0.7–4.0)
Monocytes Absolute: 0.5 10*3/uL (ref 0.1–1.0)
Monocytes Relative: 6 % (ref 3–12)
Neutrophils Relative %: 67 % (ref 43–77)

## 2011-11-10 LAB — APTT: aPTT: 27 seconds (ref 24–37)

## 2011-11-10 LAB — CBC
HCT: 34.8 % — ABNORMAL LOW (ref 36.0–46.0)
Hemoglobin: 11.4 g/dL — ABNORMAL LOW (ref 12.0–15.0)
MCH: 29.7 pg (ref 26.0–34.0)
RBC: 3.84 MIL/uL — ABNORMAL LOW (ref 3.87–5.11)

## 2011-11-10 LAB — PROTIME-INR: INR: 0.9 (ref 0.00–1.49)

## 2011-11-10 MED ORDER — LIDOCAINE-EPINEPHRINE 1 %-1:100000 IJ SOLN
INTRAMUSCULAR | Status: AC
  Filled 2011-11-10: qty 1

## 2011-11-10 MED ORDER — OXYCODONE-ACETAMINOPHEN 5-325 MG PO TABS
ORAL_TABLET | ORAL | Status: AC
  Administered 2011-11-10: 1 via ORAL
  Filled 2011-11-10: qty 1

## 2011-11-10 MED ORDER — OXYCODONE-ACETAMINOPHEN 5-325 MG PO TABS: 1.0000 | ORAL_TABLET | ORAL | Status: AC | PRN

## 2011-11-10 MED ORDER — OXYCODONE-ACETAMINOPHEN 5-325 MG PO TABS
1.0000 | ORAL_TABLET | Freq: Once | ORAL | Status: AC
  Administered 2011-11-10: 1 via ORAL

## 2011-11-10 NOTE — ED Notes (Signed)
Pt says that she has on file that she has to be stuck in her foot. Spoke with dr. Bebe Shaggy, okay to do blood draw in her foot

## 2011-11-10 NOTE — Telephone Encounter (Signed)
Pt states she had abcessed tooth pulled and was in ED all night for bleeding.  Pt states she continues to take amoxicillin and percocet.   Pt states CBG-51 10 minutes ago.  She is eating bologna sandwich, koolaid to treat hypoglycemia. Pt advised to eat fruit and sweet drink, wait until CBG >90 to eat [place] fat food.  Pt CBG-322 at 8am today.  Took lantus 14 units and novolog 10units.  Pt is currently checking CBG 4 times daily.  Dietician appt 11/14/2011. Pt advised to notify cardiac rehab Monday am with CBG readings to assess ability to attend rehab.  Understanding verbalized-jrion,rn

## 2011-11-10 NOTE — Discharge Instructions (Signed)
PLEASE CALL YOUR DENTIST TOMORROW

## 2011-11-13 ENCOUNTER — Encounter (HOSPITAL_COMMUNITY): Payer: Medicaid Other

## 2011-11-15 ENCOUNTER — Telehealth (HOSPITAL_COMMUNITY): Payer: Self-pay | Admitting: Cardiac Rehabilitation

## 2011-11-15 ENCOUNTER — Encounter (HOSPITAL_COMMUNITY): Payer: Medicaid Other

## 2011-11-17 ENCOUNTER — Encounter (HOSPITAL_COMMUNITY): Payer: Medicaid Other

## 2011-11-20 ENCOUNTER — Encounter (HOSPITAL_COMMUNITY): Payer: Medicaid Other

## 2011-11-20 ENCOUNTER — Encounter (HOSPITAL_COMMUNITY)
Admission: RE | Admit: 2011-11-20 | Discharge: 2011-11-20 | Disposition: A | Discharge status: Home / Self Care | Payer: Medicaid Other | Source: Ambulatory Visit | Attending: Interventional Cardiology | Admitting: Interventional Cardiology

## 2011-11-20 DIAGNOSIS — E785 Hyperlipidemia, unspecified: Secondary | ICD-10-CM | POA: Insufficient documentation

## 2011-11-20 DIAGNOSIS — I209 Angina pectoris, unspecified: Secondary | ICD-10-CM | POA: Insufficient documentation

## 2011-11-20 DIAGNOSIS — I2510 Atherosclerotic heart disease of native coronary artery without angina pectoris: Secondary | ICD-10-CM | POA: Insufficient documentation

## 2011-11-20 DIAGNOSIS — Z794 Long term (current) use of insulin: Secondary | ICD-10-CM | POA: Insufficient documentation

## 2011-11-20 DIAGNOSIS — I252 Old myocardial infarction: Secondary | ICD-10-CM | POA: Insufficient documentation

## 2011-11-20 DIAGNOSIS — Z9861 Coronary angioplasty status: Secondary | ICD-10-CM | POA: Insufficient documentation

## 2011-11-20 DIAGNOSIS — F17200 Nicotine dependence, unspecified, uncomplicated: Secondary | ICD-10-CM | POA: Insufficient documentation

## 2011-11-20 DIAGNOSIS — E119 Type 2 diabetes mellitus without complications: Secondary | ICD-10-CM | POA: Insufficient documentation

## 2011-11-20 DIAGNOSIS — I498 Other specified cardiac arrhythmias: Secondary | ICD-10-CM | POA: Insufficient documentation

## 2011-11-20 DIAGNOSIS — I10 Essential (primary) hypertension: Secondary | ICD-10-CM | POA: Insufficient documentation

## 2011-11-20 DIAGNOSIS — Z5189 Encounter for other specified aftercare: Secondary | ICD-10-CM | POA: Insufficient documentation

## 2011-11-20 NOTE — Progress Notes (Signed)
[name] [name] came to exercise CBG 88.  Patient given a banana and some water.  No exercise today per protocol. I asked [name] [name] to check her blood sugar prior to coming to exercise. Patient states understanding.

## 2011-11-22 ENCOUNTER — Encounter (HOSPITAL_COMMUNITY): Payer: Medicaid Other

## 2011-11-24 ENCOUNTER — Encounter (HOSPITAL_COMMUNITY): Payer: Medicaid Other

## 2011-11-27 ENCOUNTER — Encounter (HOSPITAL_COMMUNITY): Payer: Medicaid Other

## 2011-11-28 ENCOUNTER — Telehealth (HOSPITAL_COMMUNITY): Payer: Self-pay | Admitting: Cardiac Rehabilitation

## 2011-11-29 ENCOUNTER — Encounter (HOSPITAL_COMMUNITY): Payer: Medicaid Other

## 2011-12-01 ENCOUNTER — Encounter (HOSPITAL_COMMUNITY): Payer: Medicaid Other

## 2011-12-04 ENCOUNTER — Telehealth (HOSPITAL_COMMUNITY): Payer: Self-pay | Admitting: Cardiac Rehabilitation

## 2011-12-04 ENCOUNTER — Encounter (HOSPITAL_COMMUNITY): Payer: Medicaid Other

## 2011-12-06 ENCOUNTER — Encounter (HOSPITAL_COMMUNITY): Payer: Medicaid Other

## 2011-12-08 ENCOUNTER — Encounter (HOSPITAL_COMMUNITY): Payer: Medicaid Other

## 2011-12-11 ENCOUNTER — Encounter (HOSPITAL_COMMUNITY)
Admission: RE | Admit: 2011-12-11 | Discharge: 2011-12-11 | Disposition: A | Discharge status: Home / Self Care | Payer: Medicaid Other | Source: Ambulatory Visit | Attending: Interventional Cardiology | Admitting: Interventional Cardiology

## 2011-12-11 ENCOUNTER — Encounter (HOSPITAL_COMMUNITY): Payer: Medicaid Other

## 2011-12-11 ENCOUNTER — Telehealth (HOSPITAL_COMMUNITY): Payer: Self-pay | Admitting: Cardiac Rehabilitation

## 2011-12-11 DIAGNOSIS — F17200 Nicotine dependence, unspecified, uncomplicated: Secondary | ICD-10-CM | POA: Insufficient documentation

## 2011-12-11 DIAGNOSIS — E785 Hyperlipidemia, unspecified: Secondary | ICD-10-CM | POA: Insufficient documentation

## 2011-12-11 DIAGNOSIS — Z9861 Coronary angioplasty status: Secondary | ICD-10-CM | POA: Insufficient documentation

## 2011-12-11 DIAGNOSIS — E119 Type 2 diabetes mellitus without complications: Secondary | ICD-10-CM | POA: Insufficient documentation

## 2011-12-11 DIAGNOSIS — I498 Other specified cardiac arrhythmias: Secondary | ICD-10-CM | POA: Insufficient documentation

## 2011-12-11 DIAGNOSIS — I209 Angina pectoris, unspecified: Secondary | ICD-10-CM | POA: Insufficient documentation

## 2011-12-11 DIAGNOSIS — I10 Essential (primary) hypertension: Secondary | ICD-10-CM | POA: Insufficient documentation

## 2011-12-11 DIAGNOSIS — I252 Old myocardial infarction: Secondary | ICD-10-CM | POA: Insufficient documentation

## 2011-12-11 DIAGNOSIS — Z794 Long term (current) use of insulin: Secondary | ICD-10-CM | POA: Insufficient documentation

## 2011-12-11 DIAGNOSIS — Z5189 Encounter for other specified aftercare: Secondary | ICD-10-CM | POA: Insufficient documentation

## 2011-12-11 DIAGNOSIS — I2510 Atherosclerotic heart disease of native coronary artery without angina pectoris: Secondary | ICD-10-CM | POA: Insufficient documentation

## 2011-12-11 LAB — GLUCOSE, CAPILLARY
Glucose-Capillary: 58 mg/dL — ABNORMAL LOW (ref 70–99)
Glucose-Capillary: 74 mg/dL (ref 70–99)

## 2011-12-11 NOTE — Progress Notes (Signed)
Pt arrived at cardiac rehab reporting home CBG -401 this am.  Pt took lantus 20units and novolog 10 units.  Pt did not eat breakfast.  Pt CBG-58 upon arrival to cardiac rehab.  1 tube glucose gel and 30gm carbohydrate lemonade given.  CBG-74 15 minutes later.  Pt offered another snack of graham crackers.  Pt refused.  Pt states she prefers to go home to eat.  Pt has made the decision to not return to cardiac rehab.  Pt states she will exercise at home on her own.

## 2011-12-11 NOTE — Telephone Encounter (Signed)
Spoke to pt when she arrived at cardiac rehab today.  Pt is aware her 12 weeks allowed by medicaid expire 12/15/2011. Pt states she does not wish to return to cardiac rehab and will exercise on her own.

## 2011-12-11 NOTE — Progress Notes (Shared)
Cardiac Rehabilitation Program Outcomes Report   Orientation:  09/21/2011 Graduate Date:   Discharge Date:  12/11/2011 # of sessions completed: 2  Cardiologist: Eldridge Dace Family MD:  Lindon Romp Time:  0815  A.  Exercise Program:  Poor attendance due to lack of motivation and noncompliance and Discharged  B.  Mental Health:  Health related anxiety  C.  Education/Instruction/Skills:    D.  Nutrition/Weight Control/Body Composition:  Glycemic Control:  poor   E.  Blood Lipids    Lab Results  Component Value Date   CHOL  Value: 184        ATP III CLASSIFICATION:  <200     mg/dL   Desirable  161-096  mg/dL   Borderline [place]  >=045    mg/dL   [place]        4/0/9811   HDL 67 09/05/2010   LDLCALC  Value: 107        Total Cholesterol/HDL:CHD Risk Coronary Heart Disease Risk Table                     Men   Women  1/2 Average Risk   3.4   3.3  Average Risk       5.0   4.4  2 X Average Risk   9.6   7.1  3 X Average Risk  23.4   11.0        Use the calculated Patient Ratio above and the CHD Risk Table to determine the patient's CHD Risk.        ATP III CLASSIFICATION (LDL):  <100     mg/dL   Optimal  914-782  mg/dL   Near or Above                    Optimal  130-159  mg/dL   Borderline  956-213  mg/dL   [place]  >086     mg/dL   Very [place]* 12/05/8467   TRIG 52 09/05/2010   CHOLHDL 2.7 09/05/2010    F.  Lifestyle Changes:    G.  Symptoms noted with exercise:  Hypoglycemic and Hyperglycemic  Report Completed By:  Electronically signed by Harriett Sine MS on Monday Dec 11 2011 at 1640   Comments:  ***

## 2011-12-12 MED FILL — Glucose Gel 40%: ORAL | Qty: 0.83 | Status: AC

## 2011-12-13 ENCOUNTER — Encounter (HOSPITAL_COMMUNITY): Payer: Medicaid Other

## 2011-12-15 ENCOUNTER — Encounter (HOSPITAL_COMMUNITY): Payer: Medicaid Other

## 2011-12-15 NOTE — Progress Notes (Signed)
Addendum to Nutrition Section of Cardiac Rehab Program Progress Report  Pt is not following a Therapeutic Lifestyle Changes diet. Pt well-known to this Clinical research associate from previous admission. Pt needs continued encouragement from MD re: SMBG.

## 2011-12-18 ENCOUNTER — Encounter (HOSPITAL_COMMUNITY): Payer: Medicaid Other

## 2011-12-20 ENCOUNTER — Encounter (HOSPITAL_COMMUNITY): Payer: Medicaid Other

## 2011-12-22 ENCOUNTER — Encounter (HOSPITAL_COMMUNITY): Payer: Medicaid Other

## 2011-12-25 ENCOUNTER — Encounter (HOSPITAL_COMMUNITY): Payer: Medicaid Other

## 2011-12-27 ENCOUNTER — Encounter (HOSPITAL_COMMUNITY): Payer: Medicaid Other

## 2011-12-29 ENCOUNTER — Encounter (HOSPITAL_COMMUNITY): Payer: Medicaid Other

## 2012-01-31 NOTE — Progress Notes (Signed)
ADDENDUM TO CARDIAC REHAB PROGRESS NOTE:  Pt discharged from cardiac rehab program due to exceeding allowable dates to complete program.  Unfortunately pt only was able to participate in exercise 2 sessions.  Pt had frequently had blood glucose too low to exercise.  Pt also unfortunately had several absences for medical/dental reasons as well as awaiting endocrinology assistance for stabalization of glucose levels and insulin dosage adjustments.  Pt educated on proper insulin administration and proper nutrition prior to exercise.  Pt did not attend education classes and did not participate in smoking cessation counseling during outpatient rehab.  Pt plans to exercise on her own. Pt will need MD reinforcement to make positive lifestyle changes.

## 2012-03-18 ENCOUNTER — Other Ambulatory Visit: Payer: Self-pay | Admitting: Obstetrics and Gynecology

## 2012-03-18 ENCOUNTER — Other Ambulatory Visit (HOSPITAL_COMMUNITY)
Admission: RE | Admit: 2012-03-18 | Discharge: 2012-03-18 | Disposition: A | Discharge status: Home / Self Care | Payer: Medicaid Other | Source: Ambulatory Visit | Attending: Obstetrics and Gynecology | Admitting: Obstetrics and Gynecology

## 2012-03-18 DIAGNOSIS — Z1151 Encounter for screening for human papillomavirus (HPV): Secondary | ICD-10-CM | POA: Insufficient documentation

## 2012-03-18 DIAGNOSIS — Z01419 Encounter for gynecological examination (general) (routine) without abnormal findings: Secondary | ICD-10-CM | POA: Insufficient documentation

## 2012-07-02 ENCOUNTER — Emergency Department (HOSPITAL_COMMUNITY): Payer: Medicaid Other

## 2012-07-02 ENCOUNTER — Observation Stay (HOSPITAL_COMMUNITY)
Admission: EM | Admit: 2012-07-02 | Discharge: 2012-07-03 | Disposition: A | Discharge status: Home / Self Care | Payer: Medicaid Other | Attending: Interventional Cardiology | Admitting: Interventional Cardiology

## 2012-07-02 ENCOUNTER — Encounter (HOSPITAL_COMMUNITY): Payer: Self-pay | Admitting: *Deleted

## 2012-07-02 DIAGNOSIS — J069 Acute upper respiratory infection, unspecified: Secondary | ICD-10-CM | POA: Insufficient documentation

## 2012-07-02 DIAGNOSIS — M069 Rheumatoid arthritis, unspecified: Secondary | ICD-10-CM | POA: Insufficient documentation

## 2012-07-02 DIAGNOSIS — R635 Abnormal weight gain: Secondary | ICD-10-CM | POA: Insufficient documentation

## 2012-07-02 DIAGNOSIS — R0789 Other chest pain: Secondary | ICD-10-CM | POA: Insufficient documentation

## 2012-07-02 DIAGNOSIS — E162 Hypoglycemia, unspecified: Secondary | ICD-10-CM

## 2012-07-02 DIAGNOSIS — R0989 Other specified symptoms and signs involving the circulatory and respiratory systems: Secondary | ICD-10-CM | POA: Insufficient documentation

## 2012-07-02 DIAGNOSIS — R079 Chest pain, unspecified: Secondary | ICD-10-CM | POA: Diagnosis present

## 2012-07-02 DIAGNOSIS — F1911 Other psychoactive substance abuse, in remission: Secondary | ICD-10-CM | POA: Insufficient documentation

## 2012-07-02 DIAGNOSIS — F17200 Nicotine dependence, unspecified, uncomplicated: Secondary | ICD-10-CM | POA: Insufficient documentation

## 2012-07-02 DIAGNOSIS — I10 Essential (primary) hypertension: Secondary | ICD-10-CM | POA: Insufficient documentation

## 2012-07-02 DIAGNOSIS — R0609 Other forms of dyspnea: Secondary | ICD-10-CM | POA: Insufficient documentation

## 2012-07-02 DIAGNOSIS — I2510 Atherosclerotic heart disease of native coronary artery without angina pectoris: Principal | ICD-10-CM | POA: Insufficient documentation

## 2012-07-02 DIAGNOSIS — E11319 Type 2 diabetes mellitus with unspecified diabetic retinopathy without macular edema: Secondary | ICD-10-CM | POA: Insufficient documentation

## 2012-07-02 DIAGNOSIS — E1139 Type 2 diabetes mellitus with other diabetic ophthalmic complication: Secondary | ICD-10-CM | POA: Insufficient documentation

## 2012-07-02 LAB — CBC
Hemoglobin: 11.9 g/dL — ABNORMAL LOW (ref 12.0–15.0)
MCHC: 33.3 g/dL (ref 30.0–36.0)
RBC: 4.07 MIL/uL (ref 3.87–5.11)
WBC: 13.8 10*3/uL — ABNORMAL HIGH (ref 4.0–10.5)

## 2012-07-02 LAB — BASIC METABOLIC PANEL
Chloride: 102 mEq/L (ref 96–112)
GFR calc non Af Amer: 62 mL/min — ABNORMAL LOW (ref >90)
Glucose, Bld: 50 mg/dL — ABNORMAL LOW (ref 70–99)
Potassium: 3.8 mEq/L (ref 3.5–5.1)
Sodium: 135 mEq/L (ref 135–145)

## 2012-07-02 LAB — PROTIME-INR
INR: 0.97 (ref 0.00–1.49)
Prothrombin Time: 12.8 seconds (ref 11.6–15.2)

## 2012-07-02 LAB — CBC WITH DIFFERENTIAL/PLATELET
Basophils Absolute: 0 10*3/uL (ref 0.0–0.1)
Eosinophils Relative: 1 % (ref 0–5)
Lymphocytes Relative: 20 % (ref 12–46)
MCV: 87.7 fL (ref 78.0–100.0)
Neutro Abs: 9.1 10*3/uL — ABNORMAL HIGH (ref 1.7–7.7)
Neutrophils Relative %: 70 % (ref 43–77)
Platelets: 309 10*3/uL (ref 150–400)
RBC: 3.74 MIL/uL — ABNORMAL LOW (ref 3.87–5.11)
RDW: 16.5 % — ABNORMAL HIGH (ref 11.5–15.5)
WBC: 13 10*3/uL — ABNORMAL HIGH (ref 4.0–10.5)

## 2012-07-02 LAB — COMPREHENSIVE METABOLIC PANEL
ALT: <5 U/L (ref 0–35)
AST: 11 U/L (ref 0–37)
Albumin: 3.2 g/dL — ABNORMAL LOW (ref 3.5–5.2)
Chloride: 97 mEq/L (ref 96–112)
Creatinine, Ser: 0.85 mg/dL (ref 0.50–1.10)
Sodium: 128 mEq/L — ABNORMAL LOW (ref 135–145)
Total Bilirubin: 0.3 mg/dL (ref 0.3–1.2)

## 2012-07-02 LAB — POCT I-STAT TROPONIN I

## 2012-07-02 LAB — GLUCOSE, CAPILLARY
Glucose-Capillary: 32 mg/dL — CL (ref 70–99)
Glucose-Capillary: 340 mg/dL — ABNORMAL HIGH (ref 70–99)

## 2012-07-02 LAB — D-DIMER, QUANTITATIVE: D-Dimer, Quant: 2.58 ug/mL-FEU — ABNORMAL HIGH (ref 0.00–0.48)

## 2012-07-02 LAB — TROPONIN I: Troponin I: <0.3 ng/mL (ref <0.30)

## 2012-07-02 MED ORDER — SODIUM CHLORIDE 0.9 % IV SOLN: 250.0000 mL | INTRAVENOUS | Status: DC | PRN

## 2012-07-02 MED ORDER — CLOPIDOGREL BISULFATE 75 MG PO TABS
75.0000 mg | ORAL_TABLET | Freq: Every day | ORAL | Status: DC
  Administered 2012-07-03: 75 mg via ORAL
  Filled 2012-07-02 (×2): qty 1

## 2012-07-02 MED ORDER — SODIUM CHLORIDE 0.9 % IJ SOLN
3.0000 mL | Freq: Two times a day (BID) | INTRAMUSCULAR | Status: DC
  Administered 2012-07-02: 3 mL via INTRAVENOUS

## 2012-07-02 MED ORDER — INSULIN ASPART 100 UNIT/ML ~~LOC~~ SOLN: 0.0000 [IU] | Freq: Three times a day (TID) | SUBCUTANEOUS | Status: DC

## 2012-07-02 MED ORDER — INSULIN GLARGINE 100 UNIT/ML ~~LOC~~ SOLN: 14.0000 [IU] | Freq: Two times a day (BID) | SUBCUTANEOUS | Status: DC

## 2012-07-02 MED ORDER — MORPHINE SULFATE 2 MG/ML IJ SOLN
2.0000 mg | Freq: Once | INTRAMUSCULAR | Status: AC
  Administered 2012-07-02: 2 mg via INTRAVENOUS
  Filled 2012-07-02: qty 1

## 2012-07-02 MED ORDER — ONDANSETRON HCL 4 MG/2ML IJ SOLN: 4.0000 mg | Freq: Once | INTRAMUSCULAR | Status: DC

## 2012-07-02 MED ORDER — NITROGLYCERIN 0.4 MG SL SUBL
SUBLINGUAL_TABLET | SUBLINGUAL | Status: AC
  Administered 2012-07-02: 22:00:00
  Filled 2012-07-02: qty 25

## 2012-07-02 MED ORDER — NITROGLYCERIN 0.4 MG SL SUBL
0.4000 mg | SUBLINGUAL_TABLET | SUBLINGUAL | Status: DC | PRN
  Administered 2012-07-02 (×2): 0.4 mg via SUBLINGUAL
  Filled 2012-07-02: qty 25

## 2012-07-02 MED ORDER — OXYCODONE-ACETAMINOPHEN 5-325 MG PO TABS
2.0000 | ORAL_TABLET | Freq: Once | ORAL | Status: DC
  Filled 2012-07-02: qty 2

## 2012-07-02 MED ORDER — METHOTREXATE 2.5 MG PO TABS
15.0000 mg | ORAL_TABLET | ORAL | Status: DC
  Filled 2012-07-02: qty 6

## 2012-07-02 MED ORDER — SERTRALINE HCL 100 MG PO TABS
100.0000 mg | ORAL_TABLET | Freq: Every day | ORAL | Status: DC
  Filled 2012-07-02: qty 1

## 2012-07-02 MED ORDER — TRAMADOL HCL 50 MG PO TABS
50.0000 mg | ORAL_TABLET | Freq: Four times a day (QID) | ORAL | Status: DC | PRN
  Administered 2012-07-02 – 2012-07-03 (×2): 50 mg via ORAL
  Filled 2012-07-02 (×2): qty 1

## 2012-07-02 MED ORDER — MORPHINE SULFATE 2 MG/ML IJ SOLN
2.0000 mg | Freq: Once | INTRAMUSCULAR | Status: DC
  Filled 2012-07-02: qty 1

## 2012-07-02 MED ORDER — PANTOPRAZOLE SODIUM 40 MG PO TBEC
40.0000 mg | DELAYED_RELEASE_TABLET | Freq: Every day | ORAL | Status: DC
  Administered 2012-07-03: 40 mg via ORAL
  Filled 2012-07-02: qty 1

## 2012-07-02 MED ORDER — ATORVASTATIN CALCIUM 40 MG PO TABS
40.0000 mg | ORAL_TABLET | Freq: Every day | ORAL | Status: DC
  Filled 2012-07-02: qty 1

## 2012-07-02 MED ORDER — ONDANSETRON HCL 4 MG/2ML IJ SOLN: 4.0000 mg | Freq: Four times a day (QID) | INTRAMUSCULAR | Status: DC | PRN

## 2012-07-02 MED ORDER — NITROGLYCERIN 0.4 MG SL SUBL: 0.4000 mg | SUBLINGUAL_TABLET | SUBLINGUAL | Status: DC | PRN

## 2012-07-02 MED ORDER — MORPHINE SULFATE 4 MG/ML IJ SOLN
4.0000 mg | Freq: Once | INTRAMUSCULAR | Status: AC
  Administered 2012-07-02: 4 mg via INTRAMUSCULAR
  Filled 2012-07-02: qty 1

## 2012-07-02 MED ORDER — FERROUS SULFATE 325 (65 FE) MG PO TABS
325.0000 mg | ORAL_TABLET | Freq: Every day | ORAL | Status: DC
  Administered 2012-07-03: 325 mg via ORAL
  Filled 2012-07-02 (×2): qty 1

## 2012-07-02 MED ORDER — METOCLOPRAMIDE HCL 10 MG PO TABS
10.0000 mg | ORAL_TABLET | Freq: Two times a day (BID) | ORAL | Status: DC
Start: 2012-07-03 — End: 2012-07-03
  Administered 2012-07-03: 10 mg via ORAL
  Filled 2012-07-02 (×3): qty 1

## 2012-07-02 MED ORDER — METOPROLOL TARTRATE 25 MG PO TABS
25.0000 mg | ORAL_TABLET | Freq: Two times a day (BID) | ORAL | Status: DC
  Administered 2012-07-02: 25 mg via ORAL
  Filled 2012-07-02 (×3): qty 1

## 2012-07-02 MED ORDER — ACETAMINOPHEN 325 MG PO TABS
650.0000 mg | ORAL_TABLET | ORAL | Status: DC | PRN
  Administered 2012-07-02: 650 mg via ORAL
  Filled 2012-07-02: qty 2

## 2012-07-02 MED ORDER — SODIUM CHLORIDE 0.9 % IJ SOLN: 3.0000 mL | INTRAMUSCULAR | Status: DC | PRN

## 2012-07-02 MED ORDER — INSULIN ASPART 100 UNIT/ML ~~LOC~~ SOLN
0.0000 [IU] | Freq: Every day | SUBCUTANEOUS | Status: DC
  Administered 2012-07-02: 4 [IU] via SUBCUTANEOUS

## 2012-07-02 NOTE — ED Provider Notes (Signed)
I saw and evaluated the patient, reviewed the resident's note and I agree with the findings and plan.  Agree with EKG interpretation if present.   Pt with L sided chest pain, worse with cough and palpation, however associated with SOB and has history of prior cardiac stenting. She has been difficult to obtain IV access on and now has low blood sugar although remains alert. Will give PO juice and establish IV access for D50 as well.   Charles B. Bernette Mayers, MD 07/02/12 [phone]

## 2012-07-02 NOTE — Progress Notes (Signed)
Upon arrival to the unit, Pt c/o 8/10 CP and radiating to L arm. EKG done and verified NSR with possible left atrial enlargement. VSS. Pt resting on bed and watching TV. Pt stated that Tramadol didn't help her and asked for IV morphine. 3 nitro sublingual and 2L oxygen via [place] was given. CP came down to 6/10. Called on-call MD. Charm Barges. Received order for IV morphine 2 mg once. Pt ate sandwich and watched TV while stating CP is 6/10. Will continue to monitor.

## 2012-07-02 NOTE — ED Notes (Signed)
Per Dr. Mayford Knife, it is okay to start an IV in the patient's foot.

## 2012-07-02 NOTE — ED Notes (Signed)
Pt took 4 glucose tablets of her own. Pt given 2 orange juices and is eating graham crackers and peanut butter.

## 2012-07-02 NOTE — ED Notes (Signed)
Pt has 3 stents and here with chest pain to left chest area that started this am and complaints of sob.  Pt reports some cold symptoms too

## 2012-07-02 NOTE — ED Notes (Signed)
PT denies CP while eating [employer] food that family brought.

## 2012-07-02 NOTE — ED Notes (Signed)
IV start attempted by Selena Batten, RN x 2. IV team paged and saw pt. Attempted x1. Pt refused further attempts.

## 2012-07-02 NOTE — ED Notes (Signed)
Attempted to draw labs but unable to obtain specimens. Pt did state that she is a difficult stick.

## 2012-07-02 NOTE — ED Notes (Signed)
Lab notified regarding lab draw.

## 2012-07-02 NOTE — ED Notes (Signed)
PT sitting up in bed eating [employer] food. Family in room.

## 2012-07-02 NOTE — H&P (Addendum)
Admit date: 07/02/2012 Referring Physician:  Dr. Freida Busman Primary Cardiologist  Dr. Eldridge Dace Chief complaint/reason for admission:  Chest pain  HPI: This is a 46yo BF with a history of polysubstance abuse, CAD s/p 2 stents in RCA 12/12, DM, HTN and GERD/gastroparesis who presented to the ER with complaints of chest pain.   She was in her USOH until this am when she awakened and noticed left sided chest pain that awakened her from sleep.  She thought she had slept on her left side too much.  The pain was pleuritic and she was SOB.  She denied any nausea or diaphoresis.  She took her regular meds and got up.  She went to her eye MD appt but felt very slow moving.  Later in the day she took Tramadol without any relief.  She denies any fever or chills.  She does not have pain when she stops breathing but it occurs with deep breathing.  She says that the pain is identical to her heart pain she had in the past.      PMH:    Past Medical History  Diagnosis Date  . Diabetes mellitus   . Hypertension   . Sebaceous cyst   . Anemia   . Allergic rhinitis   . Hypertension   . Rheumatoid arthritis   . Diabetic retinopathy(362.0)   . Acute osteomyelitis   . Esophageal ulcer   . Esophageal stenosis   . Gastroparesis   . Renal insufficiency   . Diabetes mellitus   . GERD (gastroesophageal reflux disease)   . Hyperlipidemia   . Depression   . Anxiety   . Heart attack   . Polysubstance abuse   . Esophagitis   . CAD (coronary artery disease) 12/12    s/p DES mid and distal RCA with 50% LAD    PSH:    Past Surgical History  Procedure Date  . Cardiac catheterization   . Coronary stent placement 12/2008    ALLERGIES:   Aspirin and Codeine  Prior to Admit Meds:   (Not in a hospital admission) Family HX:    Family History  Problem Relation Age of Onset  . Breast cancer Mother 25  . Hypertension Father   . Stomach cancer Sister   . Hypertension Sister   . Colon cancer Neg Hx    Social HX:     History   Social History  . Marital Status: Married    Spouse Name: N/A    Number of Children: 1  . Years of Education: N/A   Occupational History  . Disabled     Social History Main Topics  . Smoking status: Current Every Day Smoker -- 0.5 packs/day    Types: Cigarettes  . Smokeless tobacco: Never Used  . Alcohol Use: No  . Drug Use: No  . Sexually Active: Not on file   Other Topics Concern  . Not on file   Social History Narrative   0 caffeine drinks daily      ROS:  All 11 ROS were addressed and are negative except what is stated in the HPI  PHYSICAL EXAM Filed Vitals:   07/02/12 1536  BP:   Pulse:   Temp:   Resp: 28   General: Well developed, well nourished, in no acute distress Head: Eyes PERRLA, No xanthomas.   Normal cephalic and atramatic  Lungs:   Clear bilaterally to auscultation and percussion. Heart:   HRRR S1 S2 Pulses are 2+ & equal.  No carotid bruit. No JVD.  No abdominal bruits. No femoral bruits. Abdomen: Bowel sounds are positive, abdomen soft and non-tender without masses  Extremities:   No clubbing, cyanosis or edema.  DP +1 Neuro: Alert and oriented X 3. Psych:  Good affect, responds appropriately   Labs:   Lab Results  Component Value Date   WBC 13.8* 07/02/2012   HGB 11.9* 07/02/2012   HCT 35.7* 07/02/2012   MCV 87.7 07/02/2012   PLT 320 07/02/2012    Lab 07/02/12 1456  NA 135  K 3.8  CL 102  CO2 22  BUN 19  CREATININE 1.06  CALCIUM 9.2  PROT --  BILITOT --  ALKPHOS --  ALT --  AST --  GLUCOSE 50*   Lab Results  Component Value Date   CKTOTAL 40 02/09/2011   CKMB 1.2 02/09/2011   TROPONINI <0.30 02/09/2011   No results found for this basename: PTT   Lab Results  Component Value Date   INR 0.90 11/09/2011   INR 1.01 02/10/2011   INR 0.86 11/11/2010     Lab Results  Component Value Date   CHOL  Value: 184        ATP III CLASSIFICATION:  <200     mg/dL   Desirable  119-147  mg/dL   Borderline [place]  >=829     mg/dL   [place]        12/03/2128   CHOL 183 08/25/2010   CHOL  Value: 113        ATP III CLASSIFICATION:  <200     mg/dL   Desirable  865-784  mg/dL   Borderline [place]  >=696    mg/dL   [place]        2/95/2841   Lab Results  Component Value Date   HDL 67 09/05/2010   HDL 57 08/25/2010   HDL 37* 01/14/2009   Lab Results  Component Value Date   LDLCALC  Value: 107        Total Cholesterol/HDL:CHD Risk Coronary Heart Disease Risk Table                     Men   Women  1/2 Average Risk   3.4   3.3  Average Risk       5.0   4.4  2 X Average Risk   9.6   7.1  3 X Average Risk  23.4   11.0        Use the calculated Patient Ratio above and the CHD Risk Table to determine the patient's CHD Risk.        ATP III CLASSIFICATION (LDL):  <100     mg/dL   Optimal  324-401  mg/dL   Near or Above                    Optimal  130-159  mg/dL   Borderline  027-253  mg/dL   [place]  >664     mg/dL   Very [place]* 4/0/3474   LDLCALC 101* 08/25/2010   LDLCALC  Value: 63        Total Cholesterol/HDL:CHD Risk Coronary Heart Disease Risk Table                     Men   Women  1/2 Average Risk   3.4   3.3  Average Risk       5.0   4.4  2 X Average Risk  9.6   7.1  3 X Average Risk  23.4   11.0        Use the calculated Patient Ratio above and the CHD Risk Table to determine the patient's CHD Risk.        ATP III CLASSIFICATION (LDL):  <100     mg/dL   Optimal  161-096  mg/dL   Near or Above                    Optimal  130-159  mg/dL   Borderline  045-409  mg/dL   [place]  >811     mg/dL   Very [place] 04/13/7828   Lab Results  Component Value Date   TRIG 52 09/05/2010   TRIG 123 08/25/2010   TRIG 66 01/14/2009   Lab Results  Component Value Date   CHOLHDL 2.7 09/05/2010   CHOLHDL 3.2 Ratio 08/25/2010   CHOLHDL 3.1 01/14/2009   No results found for this basename: LDLDIRECT      Radiology:   *RADIOLOGY REPORT*  Clinical Data: left-sided chest pain  CHEST - 2 VIEW  Comparison: 02/09/2011  Findings:  Cardiomediastinal silhouette is stable. No  acute infiltrate or  pleural effusion. No pulmonary edema. Bony thorax is stable.  IMPRESSION:  No active disease. No significant change.  Original Report Authenticated By: Natasha Mead, M.D.  EKG:  NSR with T wave inversions in inferolateral leads.  Compared to EKG a year ago T wave inversions in V5 and V6 are new  ASSESSMENT:  1.  Atypical chest pain with a strong pleuritic component and also reproducible with palpitation of left chest wall but she says that it is somewhat similar to her pain with her CAD in the past.  Her cardiac enzymes are negative x 1 but EKG shows new T wave changes in V5 and V6.  It is pleuritic in nature so need to consider PE as well.  Her WBC is elevated but chest xray is clear.  This could be an early URI as well.  Also need to consider pericardial CP with pleuritic component. 2.  CAD s/p PCI of mid and distal RCA with residual 50% LAD stenosis 3.  DM 4.  HTN 5.  History of polysubstance abuse 6.  Hypoglycemia  PLAN:   1.  Admit to tele bed 2.  Cycle cardiac enzymes 3.  No Heparin unless enzymes become elevated - pain is very atypical 4.  2D echo to rule out pericardial effusion 5.  Continue current meds except will hold Novolog 70/30 SS and Lantus due to hypoglycemia and will cover with SS regular 6.  NPO after midnight - further workup to be determined by Dr. Gennette Pac, MD  07/02/2012  6:50 PM

## 2012-07-02 NOTE — ED Notes (Signed)
Sheldon,MD notified of pt low CBG

## 2012-07-03 ENCOUNTER — Encounter (HOSPITAL_COMMUNITY): Payer: Self-pay | Admitting: Interventional Cardiology

## 2012-07-03 LAB — GLUCOSE, CAPILLARY: Glucose-Capillary: 281 mg/dL — ABNORMAL HIGH (ref 70–99)

## 2012-07-03 LAB — TSH: TSH: 0.603 u[IU]/mL (ref 0.350–4.500)

## 2012-07-03 LAB — TROPONIN I: Troponin I: <0.3 ng/mL (ref <0.30)

## 2012-07-03 LAB — HEMOGLOBIN A1C
Hgb A1c MFr Bld: 7.1 % — ABNORMAL HIGH (ref <5.7)
Mean Plasma Glucose: 157 mg/dL — ABNORMAL HIGH (ref <117)

## 2012-07-03 NOTE — Progress Notes (Signed)
Pt D-dimer noted to be 2.58 with last night labs. Dr Eldridge Dace made aware. Pt c/o unchanged CP in midsternal area. Denies SOB at this time. Will continue to monitor. Levonne Spiller, RN

## 2012-07-03 NOTE — Discharge Summary (Signed)
Patient IDTelena [name] MRN: [id number] DOB/AGE: 1965-08-15 46 y.o.  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  She felt some chest discomfort yesterday, worse with a deep breath and cough.  Her husband had a cold and she thinks she caught it.  She wanted to be sure it was not her heart and came to the ER.  She has not had any lower extremity edema.  She has not been on any long car trips.  She has not had any prolonged immobility.  She thought she could have had pneumonia as well based on her symptoms.  When she was told in the emergency room that she did not have pneumonia, she thought she would get to go home.  She was surprised that they kept her in the hospital.  This morning, she reports a very mild discomfort in her chest with deep breathing that she thinks is related to a combination of her cold that she caught from her husband, and her recent weight gain.  She has had this symptom before and it has improved with weight loss in the past.  She has looked into breast reduction surgery to help relieve some of the pressure that she feels on her chest at times.  She has been active of late and has not had exertional chest discomfort.  We discussed options for workup but she would like to hold off on anything at this time.  I think is reasonable as her ECG and her enzymes did not show objective evidence of ischemia.  She would like to go home.   Discharge Exam: Blood pressure 123/64, pulse 81, temperature 97.9 F (36.6 C), temperature source Oral, resp. rate 18, height 5' (1.524 m), weight 85.684 kg (188 lb 14.4 oz), last menstrual period 06/06/2012, SpO2 100.00%.  [place]/AT RRR, S1-S2 Clear to auscultation bilaterally Obese, soft  nontender No edema, IV in right foot No focal motor deficits  Labs:   Lab Results  Component Value Date   WBC 13.0* 07/02/2012   HGB 11.0* 07/02/2012   HCT 32.8* 07/02/2012   MCV 87.7 07/02/2012   PLT 309 07/02/2012    Lab 07/02/12 2211  NA 128*  K 3.9  CL 97  CO2 20  BUN 17  CREATININE 0.85  CALCIUM 8.8  PROT 7.0  BILITOT 0.3  ALKPHOS 109  ALT <5  AST 11  GLUCOSE 364*   Lab Results  Component Value Date   CKTOTAL 40 02/09/2011   CKMB 1.2 02/09/2011   TROPONINI <0.30 07/03/2012    Lab Results  Component Value Date   CHOL  Value: 184        ATP III CLASSIFICATION:  <200     mg/dL   Desirable  413-244  mg/dL   Borderline Dom[place]ki  >=010    mg/dL   [place]        09/06/2534   CHOL 183 08/25/2010   CHOL  Value: 113        ATP III CLASSIFICATION:  <200     mg/dL   Desirable  644-034  mg/dL   Borderline [place]  >=742    mg/dL   [place]        5/95/6387   Lab Results  Component Value Date   HDL  67 09/05/2010   HDL 57 08/25/2010   HDL 37* 01/14/2009   Lab Results  Component Value Date   LDLCALC  Value: 107        Total Cholesterol/HDL:CHD Risk Coronary Heart Disease Risk Table                     Men   Women  1/2 Average Risk   3.4   3.3  Average Risk       5.0   4.4  2 X Average Risk   9.6   7.1  3 X Average Risk  23.4   11.0        Use the calculated Patient Ratio above and the CHD Risk Table to determine the patient's CHD Risk.        ATP III CLASSIFICATION (LDL):  <100     mg/dL   Optimal  161-096  mg/dL   Near or Above                    Optimal  130-159  mg/dL   Borderline  045-409  mg/dL   [place]  >811     mg/dL   Very [place]* 03/31/4781   LDLCALC 101* 08/25/2010   LDLCALC  Value: 63        Total Cholesterol/HDL:CHD Risk Coronary Heart Disease Risk Table                     Men   Women  1/2 Average Risk   3.4   3.3  Average Risk       5.0   4.4  2 X Average Risk   9.6   7.1  3 X Average Risk  23.4   11.0        Use the calculated Patient Ratio above and the CHD Risk Table to determine the patient's  CHD Risk.        ATP III CLASSIFICATION (LDL):  <100     mg/dL   Optimal  956-213  mg/dL   Near or Above                    Optimal  130-159  mg/dL   Borderline  086-578  mg/dL   [place]  >469     mg/dL   Very [place] 01/27/5283   Lab Results  Component Value Date   TRIG 52 09/05/2010   TRIG 123 08/25/2010   TRIG 66 01/14/2009   Lab Results  Component Value Date   CHOLHDL 2.7 09/05/2010   CHOLHDL 3.2 Ratio 08/25/2010   CHOLHDL 3.1 01/14/2009   No results found for this basename: LDLDIRECT      Radiology: Chest x-ray showed no active disease EKG: Normal sinus rhythm, prior inferior MI  FOLLOW UP PLANS AND APPOINTMENTS    Medication List     As of 07/03/2012  9:03 AM    TAKE these medications         atorvastatin 40 MG tablet   Commonly known as: LIPITOR   Take 40 mg by mouth every morning.      clopidogrel 75 MG tablet   Commonly known as: PLAVIX   Take 75 mg by mouth daily.      ferrous sulfate 325 (65 FE) MG tablet   Take 325 mg by mouth daily with breakfast.      folic acid 1 MG tablet   Commonly known as: FOLVITE   Take 1 mg by  mouth daily.      insulin aspart protamine-insulin aspart (70-30) 100 UNIT/ML injection   Commonly known as: NOVOLOG 70/30   Inject 4-20 Units into the skin 2 (two) times daily. Sliding scale as directed      insulin glargine 100 UNIT/ML injection   Commonly known as: LANTUS   Inject 14 Units into the skin 2 (two) times daily.      methotrexate 2.5 MG tablet   Commonly known as: RHEUMATREX   Take 15 mg by mouth once a week. Wednesday. Caution:Chemotherapy. Protect from light.      metoCLOPramide 10 MG tablet   Commonly known as: REGLAN   Take 10 mg by mouth 2 (two) times daily.      metoprolol tartrate 25 MG tablet   Commonly known as: LOPRESSOR   Take 25 mg by mouth 2 (two) times daily.      nitroGLYCERIN 0.4 MG SL tablet   Commonly known as: NITROSTAT   Place 0.4 mg under the tongue every 5 (five) minutes as needed. For chest pain       pantoprazole 40 MG tablet   Commonly known as: PROTONIX   Take 40 mg by mouth daily at 6 (six) AM.      sertraline 100 MG tablet   Commonly known as: ZOLOFT   Take 100 mg by mouth daily.      traMADol 50 MG tablet   Commonly known as: ULTRAM   Take 50 mg by mouth every 6 (six) hours as needed. For pain           Follow-up Information    Follow up with Corky Crafts., MD. (as scheduled)    Contact information:   [address] [place] [address] [phone]          BRING ALL MEDICATIONS WITH YOU TO FOLLOW UP APPOINTMENTS  Time spent with patient to include physician time: 25 minutes Signed: Blase Beckner S. 07/03/2012, 9:03 AM

## 2012-07-03 NOTE — Progress Notes (Signed)
To follow up with CP, Pt stated no more CP. Pt resting on bed. Will continue to monitor.

## 2012-07-03 NOTE — ED Provider Notes (Signed)
History     CSN: [id number]  Arrival date & time 07/02/12  1330   First MD Initiated Contact with Patient 07/02/12 1510     Chief Complaint  Patient presents with  . Chest Pain   HPI: [name] is a 46 yo AAF with complex medical history including but not limited to DM, RA, HTN, tobacco abuse, CAD s/p 3 stents, most recent 07/2011 who presents with left sided chest pain and shortness of breath. Symptoms started at 5 am this morning, waking her up from sleep. She describes the pain as aching, radiating to her neck and left arm, constant, 7/10, worsened with laying on her left side and moving her arm. She has attempted to take Tramadol without relief of symptoms. Has associated shortness of breath due to pain with inspiration. She states these symptoms are similar to the last time she had a stent placed. She is concerned she may be having an MI so she presents for evaluation. She denies cough, fever, chills, nausea, diaphoresis, or syncope.   Past Medical History  Diagnosis Date  . Diabetes mellitus   . Hypertension   . Sebaceous cyst   . Anemia   . Allergic rhinitis   . Hypertension   . Rheumatoid arthritis   . Diabetic retinopathy(362.0)   . Acute osteomyelitis   . Esophageal ulcer   . Esophageal stenosis   . Gastroparesis   . Renal insufficiency   . Diabetes mellitus   . GERD (gastroesophageal reflux disease)   . Hyperlipidemia   . Depression   . Anxiety   . Heart attack   . Polysubstance abuse   . Esophagitis   . CAD (coronary artery disease) 12/12    s/p DES mid and distal RCA with 50% LAD    Past Surgical History  Procedure Date  . Cardiac catheterization   . Coronary stent placement 12/2008    Family History  Problem Relation Age of Onset  . Breast cancer Mother 36  . Hypertension Father   . Stomach cancer Sister   . Hypertension Sister   . Colon cancer Neg Hx     History  Substance Use Topics  . Smoking status: Current Every Day Smoker -- 0.5 packs/day     Types: Cigarettes  . Smokeless tobacco: Never Used  . Alcohol Use: No    OB History    Grav Para Term Preterm Abortions TAB SAB Ect Mult Living                  Review of Systems  Constitutional: Negative for fever, chills and fatigue.  HENT: Negative for nosebleeds and congestion.   Eyes: Negative for photophobia and visual disturbance.  Respiratory: Positive for shortness of breath.   Cardiovascular: Positive for chest pain.  Gastrointestinal: Negative for nausea, vomiting, abdominal pain and diarrhea.  Genitourinary: Negative for dysuria, urgency and decreased urine volume.  Musculoskeletal: Positive for myalgias (left arm). Negative for arthralgias.  Skin: Negative for pallor and rash.  Neurological: Negative for dizziness, weakness and headaches.  Psychiatric/Behavioral: Negative for confusion and agitation.  All other systems reviewed and are negative.    Allergies  Aspirin and Codeine  Home Medications  No current outpatient prescriptions on file.  BP 135/69  Pulse 89  Temp 98.5 F (36.9 C) (Oral)  Resp 17  Ht 5' (1.524 m)  Wt 188 lb 14.4 oz (85.684 kg)  BMI 36.89 kg/m2  SpO2 100%  LMP 06/06/2012  Physical Exam  Nursing note and vitals  reviewed. Constitutional: She is oriented to person, place, and time. No distress.       Overweight female, well-groomed, appears uncomfortable   HENT:  Head: Normocephalic and atraumatic.  Mouth/Throat: Oropharynx is clear and moist.  Eyes: Conjunctivae normal and EOM are normal. Pupils are equal, round, and reactive to light.  Neck: Normal range of motion. Neck supple.  Cardiovascular: Normal rate, regular rhythm and normal heart sounds.   No murmur heard. Pulmonary/Chest: Effort normal and breath sounds normal. No respiratory distress. She exhibits tenderness (along left lateral chest wall extending to axilla).  Abdominal: Soft. Bowel sounds are normal. There is no tenderness.  Musculoskeletal: Normal range of motion.  She exhibits no edema.  Neurological: She is alert and oriented to person, place, and time. No cranial nerve deficit.  Skin: Skin is warm and dry.    ED Course  Procedures   Labs Reviewed  CBC - Abnormal; Notable for the following:    WBC 13.8 (*)     Hemoglobin 11.9 (*)     HCT 35.7 (*)     RDW 16.5 (*)     All other components within normal limits  BASIC METABOLIC PANEL - Abnormal; Notable for the following:    Glucose, Bld 50 (*)     GFR calc non Af Amer 62 (*)     GFR calc Af Amer 72 (*)     All other components within normal limits  GLUCOSE, CAPILLARY - Abnormal; Notable for the following:    Glucose-Capillary 32 (*)     All other components within normal limits  GLUCOSE, CAPILLARY - Abnormal; Notable for the following:    Glucose-Capillary 139 (*)     All other components within normal limits  GLUCOSE, CAPILLARY - Abnormal; Notable for the following:    Glucose-Capillary 169 (*)     All other components within normal limits  CBC WITH DIFFERENTIAL - Abnormal; Notable for the following:    WBC 13.0 (*)     RBC 3.74 (*)     Hemoglobin 11.0 (*)     HCT 32.8 (*)     RDW 16.5 (*)     Neutro Abs 9.1 (*)     Monocytes Absolute 1.1 (*)     All other components within normal limits  COMPREHENSIVE METABOLIC PANEL - Abnormal; Notable for the following:    Sodium 128 (*)  DELTA CHECK NOTED   Glucose, Bld 364 (*)     Albumin 3.2 (*)     GFR calc non Af Amer 82 (*)     All other components within normal limits  PRO B NATRIURETIC PEPTIDE - Abnormal; Notable for the following:    Pro B Natriuretic peptide (BNP) 666.2 (*)     All other components within normal limits  D-DIMER, QUANTITATIVE - Abnormal; Notable for the following:    D-Dimer, Quant 2.58 (*)     All other components within normal limits  GLUCOSE, CAPILLARY - Abnormal; Notable for the following:    Glucose-Capillary 340 (*)     All other components within normal limits  POCT I-STAT TROPONIN I  TROPONIN I   PROTIME-INR  TROPONIN I  TROPONIN I  TSH  HEMOGLOBIN A1C  OCCULT BLOOD X 1 CARD TO LAB, STOOL   Dg Chest 2 View  07/02/2012  *RADIOLOGY REPORT*  Clinical Data:  left-sided chest pain  CHEST - 2 VIEW  Comparison: 02/09/2011  Findings: Cardiomediastinal silhouette is stable.  No acute infiltrate or pleural effusion.  No  pulmonary edema.  Bony thorax is stable.  IMPRESSION: No active disease.  No significant change.   Original Report Authenticated By: Natasha Mead, M.D.    1. Chest pain   2. Coronary atherosclerosis of unspecified type of vessel, native or graft   3. Hypoglycemia, unspecified   4. Unspecified essential hypertension    MDM  46 yo AAF with complex medical history including but not limited to DM, RA, HTN, tobacco abuse, CAD s/p 3 stents, most recent 07/2011 who presents with left sided chest pain and shortness of breath. Afebrile, vital signs stable. Although there is a pleuritic component to her CP, doubt PE as she is low risk by Well's and PERC negative. Symptoms most c/w MSK etiology as she is TTP diffusely along chest wall, but she states symptoms are similar to previous MI. Initial ECG without acute ischemia or infarction. Initial troponin normal. WBC mildly elevated to 13, Hgb 11, normal lytes and cr. One episode of hypoglycemia, treated with oral juice and peanut butter with improvement of glucose to 130. No further hypoglycemia in ED. CXR without acute disease, doubt PNA. Although symptoms are atypical for ACS she is Bronkema risk and therefore inappropriate for DC from ED. Admitted to Cardiology in stable condition. Patient in agreement with plan.   Reviewed imaging, ECG, labs and previous medical records, utilized in MDM  Discussed case with Dr. Bernette Mayers  Clinical Impression 1. Chest pain 2. Hypoglycemia 3. Leukocytosis  4. Chronic anemia        Margie Billet, MD 07/03/12 2106

## 2012-07-03 NOTE — Progress Notes (Signed)
Pt provided with dc instructions ande ducation. Pt verbalized understanding. Pt has no questions. VSS. IV removed with tip intact. Heart monitor removed and cleaned. Pt leaving by foot for home. Levonne Spiller, RN.

## 2012-07-09 ENCOUNTER — Ambulatory Visit: Payer: Medicaid Other | Attending: Internal Medicine | Admitting: Physical Therapy

## 2012-07-09 DIAGNOSIS — M542 Cervicalgia: Secondary | ICD-10-CM | POA: Insufficient documentation

## 2012-07-09 DIAGNOSIS — R293 Abnormal posture: Secondary | ICD-10-CM | POA: Insufficient documentation

## 2012-07-09 DIAGNOSIS — M25519 Pain in unspecified shoulder: Secondary | ICD-10-CM | POA: Insufficient documentation

## 2012-07-09 DIAGNOSIS — IMO0001 Reserved for inherently not codable concepts without codable children: Secondary | ICD-10-CM | POA: Insufficient documentation

## 2012-07-16 ENCOUNTER — Ambulatory Visit: Payer: Medicaid Other | Admitting: Rehabilitation

## 2012-07-25 ENCOUNTER — Ambulatory Visit: Payer: Medicaid Other | Admitting: Rehabilitation

## 2012-07-29 ENCOUNTER — Ambulatory Visit: Payer: Medicaid Other | Admitting: Rehabilitation

## 2012-08-06 ENCOUNTER — Other Ambulatory Visit: Payer: Self-pay | Admitting: Internal Medicine

## 2012-08-06 DIAGNOSIS — Z1231 Encounter for screening mammogram for malignant neoplasm of breast: Secondary | ICD-10-CM

## 2012-09-05 ENCOUNTER — Other Ambulatory Visit: Payer: Self-pay | Admitting: Internal Medicine

## 2012-09-05 ENCOUNTER — Ambulatory Visit
Admission: RE | Admit: 2012-09-05 | Discharge: 2012-09-05 | Disposition: A | Discharge status: Home / Self Care | Payer: Medicaid Other | Source: Ambulatory Visit | Attending: Internal Medicine | Admitting: Internal Medicine

## 2012-09-05 DIAGNOSIS — Z1231 Encounter for screening mammogram for malignant neoplasm of breast: Secondary | ICD-10-CM

## 2012-09-05 DIAGNOSIS — N6489 Other specified disorders of breast: Secondary | ICD-10-CM

## 2012-09-05 DIAGNOSIS — N644 Mastodynia: Secondary | ICD-10-CM

## 2012-10-15 ENCOUNTER — Ambulatory Visit
Admission: RE | Admit: 2012-10-15 | Discharge: 2012-10-15 | Disposition: A | Discharge status: Home / Self Care | Payer: Medicaid Other | Source: Ambulatory Visit | Attending: Internal Medicine | Admitting: Internal Medicine

## 2012-10-15 DIAGNOSIS — N6489 Other specified disorders of breast: Secondary | ICD-10-CM

## 2012-10-15 DIAGNOSIS — N644 Mastodynia: Secondary | ICD-10-CM

## 2012-12-19 ENCOUNTER — Inpatient Hospital Stay (HOSPITAL_COMMUNITY)
Admission: EM | Admit: 2012-12-19 | Discharge: 2012-12-20 | DRG: 246 | Disposition: A | Discharge status: Home / Self Care | Payer: Medicaid Other | Attending: Interventional Cardiology | Admitting: Interventional Cardiology

## 2012-12-19 ENCOUNTER — Encounter (HOSPITAL_COMMUNITY)
Admission: EM | Disposition: A | Discharge status: Home / Self Care | Payer: Self-pay | Source: Home / Self Care | Attending: Interventional Cardiology

## 2012-12-19 ENCOUNTER — Encounter (HOSPITAL_COMMUNITY): Payer: Self-pay | Admitting: Emergency Medicine

## 2012-12-19 ENCOUNTER — Emergency Department (HOSPITAL_COMMUNITY): Payer: Medicaid Other

## 2012-12-19 DIAGNOSIS — Z794 Long term (current) use of insulin: Secondary | ICD-10-CM

## 2012-12-19 DIAGNOSIS — I252 Old myocardial infarction: Secondary | ICD-10-CM

## 2012-12-19 DIAGNOSIS — Z955 Presence of coronary angioplasty implant and graft: Secondary | ICD-10-CM

## 2012-12-19 DIAGNOSIS — E11319 Type 2 diabetes mellitus with unspecified diabetic retinopathy without macular edema: Secondary | ICD-10-CM | POA: Diagnosis present

## 2012-12-19 DIAGNOSIS — F1910 Other psychoactive substance abuse, uncomplicated: Secondary | ICD-10-CM | POA: Diagnosis present

## 2012-12-19 DIAGNOSIS — F329 Major depressive disorder, single episode, unspecified: Secondary | ICD-10-CM

## 2012-12-19 DIAGNOSIS — F3289 Other specified depressive episodes: Secondary | ICD-10-CM | POA: Diagnosis present

## 2012-12-19 DIAGNOSIS — I2129 ST elevation (STEMI) myocardial infarction involving other sites: Secondary | ICD-10-CM | POA: Insufficient documentation

## 2012-12-19 DIAGNOSIS — I219 Acute myocardial infarction, unspecified: Secondary | ICD-10-CM | POA: Insufficient documentation

## 2012-12-19 DIAGNOSIS — F411 Generalized anxiety disorder: Secondary | ICD-10-CM

## 2012-12-19 DIAGNOSIS — K2210 Ulcer of esophagus without bleeding: Secondary | ICD-10-CM | POA: Diagnosis present

## 2012-12-19 DIAGNOSIS — K222 Esophageal obstruction: Secondary | ICD-10-CM | POA: Diagnosis present

## 2012-12-19 DIAGNOSIS — I214 Non-ST elevation (NSTEMI) myocardial infarction: Principal | ICD-10-CM | POA: Diagnosis present

## 2012-12-19 DIAGNOSIS — F17200 Nicotine dependence, unspecified, uncomplicated: Secondary | ICD-10-CM | POA: Diagnosis present

## 2012-12-19 DIAGNOSIS — I5021 Acute systolic (congestive) heart failure: Secondary | ICD-10-CM | POA: Diagnosis present

## 2012-12-19 DIAGNOSIS — N289 Disorder of kidney and ureter, unspecified: Secondary | ICD-10-CM | POA: Diagnosis present

## 2012-12-19 DIAGNOSIS — M069 Rheumatoid arthritis, unspecified: Secondary | ICD-10-CM | POA: Diagnosis present

## 2012-12-19 DIAGNOSIS — I509 Heart failure, unspecified: Secondary | ICD-10-CM

## 2012-12-19 DIAGNOSIS — K219 Gastro-esophageal reflux disease without esophagitis: Secondary | ICD-10-CM | POA: Diagnosis present

## 2012-12-19 DIAGNOSIS — D649 Anemia, unspecified: Secondary | ICD-10-CM | POA: Diagnosis present

## 2012-12-19 DIAGNOSIS — E109 Type 1 diabetes mellitus without complications: Secondary | ICD-10-CM

## 2012-12-19 DIAGNOSIS — E1139 Type 2 diabetes mellitus with other diabetic ophthalmic complication: Secondary | ICD-10-CM | POA: Diagnosis present

## 2012-12-19 DIAGNOSIS — I10 Essential (primary) hypertension: Secondary | ICD-10-CM | POA: Diagnosis present

## 2012-12-19 DIAGNOSIS — E785 Hyperlipidemia, unspecified: Secondary | ICD-10-CM

## 2012-12-19 DIAGNOSIS — I2510 Atherosclerotic heart disease of native coronary artery without angina pectoris: Secondary | ICD-10-CM | POA: Diagnosis present

## 2012-12-19 HISTORY — DX: Acute myocardial infarction, unspecified: I21.9

## 2012-12-19 HISTORY — DX: ST elevation (STEMI) myocardial infarction involving other sites: I21.29

## 2012-12-19 HISTORY — DX: Cardiac murmur, unspecified: R01.1

## 2012-12-19 HISTORY — DX: Headache: R51

## 2012-12-19 HISTORY — DX: Angina pectoris, unspecified: I20.9

## 2012-12-19 HISTORY — DX: Pneumonia, unspecified organism: J18.9

## 2012-12-19 HISTORY — DX: Personal history of peptic ulcer disease: Z87.11

## 2012-12-19 HISTORY — DX: Headache, unspecified: R51.9

## 2012-12-19 HISTORY — DX: Type 2 diabetes mellitus without complications: E11.9

## 2012-12-19 HISTORY — DX: ST elevation (STEMI) myocardial infarction involving other coronary artery of inferior wall: I21.19

## 2012-12-19 HISTORY — DX: Bipolar disorder, unspecified: F31.9

## 2012-12-19 HISTORY — DX: Personal history of other diseases of the digestive system: Z87.19

## 2012-12-19 HISTORY — PX: LEFT HEART CATHETERIZATION WITH CORONARY ANGIOGRAM: SHX5451

## 2012-12-19 HISTORY — DX: Cerebral infarction, unspecified: I63.9

## 2012-12-19 HISTORY — DX: Migraine, unspecified, not intractable, without status migrainosus: G43.909

## 2012-12-19 HISTORY — PX: CORONARY ANGIOPLASTY WITH STENT PLACEMENT: SHX49

## 2012-12-19 HISTORY — DX: Orthopnea: R06.01

## 2012-12-19 LAB — CBC
MCH: 30.2 pg (ref 26.0–34.0)
MCHC: 33.9 g/dL (ref 30.0–36.0)
Platelets: 272 10*3/uL (ref 150–400)
RDW: 17.2 % — ABNORMAL HIGH (ref 11.5–15.5)

## 2012-12-19 LAB — GLUCOSE, CAPILLARY
Glucose-Capillary: 315 mg/dL — ABNORMAL HIGH (ref 70–99)
Glucose-Capillary: 209 mg/dL — ABNORMAL HIGH (ref 70–99)
Glucose-Capillary: 280 mg/dL — ABNORMAL HIGH (ref 70–99)

## 2012-12-19 LAB — POCT I-STAT TROPONIN I

## 2012-12-19 LAB — BASIC METABOLIC PANEL
Calcium: 8.9 mg/dL (ref 8.4–10.5)
GFR calc Af Amer: 78 mL/min — ABNORMAL LOW (ref >90)
GFR calc non Af Amer: 67 mL/min — ABNORMAL LOW (ref >90)
Potassium: 3.8 mEq/L (ref 3.5–5.1)
Sodium: 135 mEq/L (ref 135–145)

## 2012-12-19 LAB — POCT ACTIVATED CLOTTING TIME: Activated Clotting Time: 258 seconds

## 2012-12-19 LAB — PROTIME-INR
INR: 0.93 (ref 0.00–1.49)
Prothrombin Time: 12.4 seconds (ref 11.6–15.2)

## 2012-12-19 SURGERY — LEFT HEART CATHETERIZATION WITH CORONARY ANGIOGRAM
Anesthesia: LOCAL

## 2012-12-19 SURGERY — LEFT HEART CATHETERIZATION WITH CORONARY ANGIOGRAM
Anesthesia: Moderate Sedation

## 2012-12-19 MED ORDER — DIAZEPAM 5 MG PO TABS: 5.0000 mg | ORAL_TABLET | ORAL | Status: AC

## 2012-12-19 MED ORDER — VERAPAMIL HCL 2.5 MG/ML IV SOLN
INTRAVENOUS | Status: AC
  Filled 2012-12-19: qty 2

## 2012-12-19 MED ORDER — CLOPIDOGREL BISULFATE 300 MG PO TABS
ORAL_TABLET | ORAL | Status: AC
  Filled 2012-12-19: qty 1

## 2012-12-19 MED ORDER — HEPARIN (PORCINE) IN NACL 2-0.9 UNIT/ML-% IJ SOLN
INTRAMUSCULAR | Status: AC
  Filled 2012-12-19: qty 3000

## 2012-12-19 MED ORDER — INSULIN ASPART PROT & ASPART (70-30 MIX) 100 UNIT/ML ~~LOC~~ SUSP
10.0000 [IU] | Freq: Two times a day (BID) | SUBCUTANEOUS | Status: DC
  Filled 2012-12-19: qty 10

## 2012-12-19 MED ORDER — DIAZEPAM 5 MG PO TABS
ORAL_TABLET | ORAL | Status: AC
  Administered 2012-12-19: 5 mg via ORAL
  Filled 2012-12-19: qty 1

## 2012-12-19 MED ORDER — METOCLOPRAMIDE HCL 10 MG PO TABS
10.0000 mg | ORAL_TABLET | Freq: Two times a day (BID) | ORAL | Status: DC
  Administered 2012-12-19 – 2012-12-20 (×2): 10 mg via ORAL
  Filled 2012-12-19 (×3): qty 1

## 2012-12-19 MED ORDER — SODIUM CHLORIDE 0.9 % IJ SOLN: 3.0000 mL | Freq: Two times a day (BID) | INTRAMUSCULAR | Status: DC

## 2012-12-19 MED ORDER — INSULIN ASPART 100 UNIT/ML ~~LOC~~ SOLN
0.0000 [IU] | Freq: Three times a day (TID) | SUBCUTANEOUS | Status: DC
  Administered 2012-12-19 – 2012-12-20 (×2): 5 [IU] via SUBCUTANEOUS
  Administered 2012-12-20: 09:00:00 10 [IU] via SUBCUTANEOUS

## 2012-12-19 MED ORDER — TRAMADOL HCL 50 MG PO TABS
50.0000 mg | ORAL_TABLET | Freq: Four times a day (QID) | ORAL | Status: DC | PRN
  Administered 2012-12-19 – 2012-12-20 (×4): 50 mg via ORAL
  Filled 2012-12-19 (×4): qty 1

## 2012-12-19 MED ORDER — INSULIN GLARGINE 100 UNIT/ML ~~LOC~~ SOLN
14.0000 [IU] | Freq: Two times a day (BID) | SUBCUTANEOUS | Status: DC
  Administered 2012-12-19 – 2012-12-20 (×2): 14 [IU] via SUBCUTANEOUS
  Filled 2012-12-19 (×3): qty 0.14

## 2012-12-19 MED ORDER — FERROUS SULFATE 325 (65 FE) MG PO TABS
325.0000 mg | ORAL_TABLET | Freq: Every day | ORAL | Status: DC
  Filled 2012-12-19 (×2): qty 1

## 2012-12-19 MED ORDER — SODIUM CHLORIDE 0.9 % IV SOLN: INTRAVENOUS | Status: DC

## 2012-12-19 MED ORDER — SODIUM CHLORIDE 0.9 % IJ SOLN: 3.0000 mL | INTRAMUSCULAR | Status: DC | PRN

## 2012-12-19 MED ORDER — CLOPIDOGREL BISULFATE 75 MG PO TABS
75.0000 mg | ORAL_TABLET | Freq: Every day | ORAL | Status: DC
  Filled 2012-12-19: qty 1

## 2012-12-19 MED ORDER — PANTOPRAZOLE SODIUM 40 MG PO TBEC
40.0000 mg | DELAYED_RELEASE_TABLET | Freq: Every day | ORAL | Status: DC
  Administered 2012-12-19 – 2012-12-20 (×2): 40 mg via ORAL
  Filled 2012-12-19 (×2): qty 1

## 2012-12-19 MED ORDER — METOPROLOL TARTRATE 25 MG PO TABS
25.0000 mg | ORAL_TABLET | Freq: Two times a day (BID) | ORAL | Status: DC
  Administered 2012-12-19 – 2012-12-20 (×2): 25 mg via ORAL
  Filled 2012-12-19 (×3): qty 1

## 2012-12-19 MED ORDER — HYDROMORPHONE HCL PF 2 MG/ML IJ SOLN
2.0000 mg | Freq: Once | INTRAMUSCULAR | Status: AC
  Administered 2012-12-19: 2 mg via INTRAMUSCULAR
  Filled 2012-12-19: qty 1

## 2012-12-19 MED ORDER — METHOTREXATE 2.5 MG PO TABS: 15.0000 mg | ORAL_TABLET | ORAL | Status: DC

## 2012-12-19 MED ORDER — CLOPIDOGREL BISULFATE 75 MG PO TABS
75.0000 mg | ORAL_TABLET | Freq: Every day | ORAL | Status: DC
  Administered 2012-12-20: 09:00:00 75 mg via ORAL

## 2012-12-19 MED ORDER — LIDOCAINE HCL (PF) 1 % IJ SOLN
INTRAMUSCULAR | Status: AC
  Filled 2012-12-19: qty 30

## 2012-12-19 MED ORDER — SERTRALINE HCL 100 MG PO TABS
100.0000 mg | ORAL_TABLET | Freq: Every day | ORAL | Status: DC
  Administered 2012-12-19 – 2012-12-20 (×2): 100 mg via ORAL
  Filled 2012-12-19 (×2): qty 1

## 2012-12-19 MED ORDER — ASPIRIN 81 MG PO CHEW: 324.0000 mg | CHEWABLE_TABLET | ORAL | Status: DC

## 2012-12-19 MED ORDER — FUROSEMIDE 10 MG/ML IJ SOLN
40.0000 mg | Freq: Once | INTRAMUSCULAR | Status: AC
  Administered 2012-12-19: 40 mg via INTRAVENOUS
  Filled 2012-12-19: qty 4

## 2012-12-19 MED ORDER — HEPARIN SODIUM (PORCINE) 1000 UNIT/ML IJ SOLN
INTRAMUSCULAR | Status: AC
  Filled 2012-12-19: qty 1

## 2012-12-19 MED ORDER — NITROGLYCERIN 0.4 MG SL SUBL: 0.4000 mg | SUBLINGUAL_TABLET | SUBLINGUAL | Status: DC | PRN

## 2012-12-19 MED ORDER — INSULIN ASPART PROT & ASPART (70-30 MIX) 100 UNIT/ML ~~LOC~~ SUSP: 4.0000 [IU] | Freq: Two times a day (BID) | SUBCUTANEOUS | Status: DC

## 2012-12-19 MED ORDER — INSULIN ASPART 100 UNIT/ML ~~LOC~~ SOLN
0.0000 [IU] | SUBCUTANEOUS | Status: DC
  Administered 2012-12-19: 9 [IU] via SUBCUTANEOUS
  Filled 2012-12-19: qty 1

## 2012-12-19 MED ORDER — SODIUM CHLORIDE 0.9 % IV SOLN: 250.0000 mL | INTRAVENOUS | Status: DC | PRN

## 2012-12-19 MED ORDER — ACETAMINOPHEN 325 MG PO TABS
650.0000 mg | ORAL_TABLET | ORAL | Status: DC | PRN
  Filled 2012-12-19: qty 2

## 2012-12-19 MED ORDER — NITROGLYCERIN IN D5W 200-5 MCG/ML-% IV SOLN
10.0000 ug/min | INTRAVENOUS | Status: DC
  Administered 2012-12-19: 10 ug/min via INTRAVENOUS
  Filled 2012-12-19: qty 250

## 2012-12-19 MED ORDER — FOLIC ACID 1 MG PO TABS
1.0000 mg | ORAL_TABLET | Freq: Every day | ORAL | Status: DC
  Administered 2012-12-19 – 2012-12-20 (×2): 1 mg via ORAL
  Filled 2012-12-19 (×2): qty 1

## 2012-12-19 MED ORDER — INSULIN GLARGINE 100 UNIT/ML ~~LOC~~ SOLN: 14.0000 [IU] | Freq: Two times a day (BID) | SUBCUTANEOUS | Status: DC

## 2012-12-19 MED ORDER — ONDANSETRON HCL 4 MG/2ML IJ SOLN: 4.0000 mg | Freq: Four times a day (QID) | INTRAMUSCULAR | Status: DC | PRN

## 2012-12-19 MED ORDER — ACETAMINOPHEN 325 MG PO TABS
650.0000 mg | ORAL_TABLET | ORAL | Status: DC | PRN
  Administered 2012-12-19 – 2012-12-20 (×2): 650 mg via ORAL
  Filled 2012-12-19: qty 2

## 2012-12-19 MED ORDER — ATORVASTATIN CALCIUM 40 MG PO TABS
40.0000 mg | ORAL_TABLET | Freq: Every day | ORAL | Status: DC
  Administered 2012-12-19 – 2012-12-20 (×2): 40 mg via ORAL
  Filled 2012-12-19 (×2): qty 1

## 2012-12-19 NOTE — H&P (Signed)
Admit date: 12/19/2012 Referring Physician Dr. Judd Lien Primary Cardiologist Eldridge Dace Chief complaint/reason for admission: chest pain, abnormal troponin, NSTEMI  HPI: 47 year old woman with prior inferior MI and right coronary artery stents.  She was in her usual state of health until Monday.  At that time, she had some mild discomfort in her chest.  She felt somewhat fluid overloaded.  She thought this was related to her menstrual cycle.  Symptoms eased off over the next couple days.  This morning about 2:30, she woke up with sharp pain in both sides of her chest.  She felt feverish and thought she had pneumonia.  She came to the emergency room.  Chest x-ray was abnormal.  She was given narcotics as well as nitroglycerin and has had relief of her chest pain.  BNP was elevated.  Troponin was also elevated.  We are called for further management.  Currently, she is not having any discomfort in her chest.  She feels like her heart is pounding.    PMH:    Past Medical History  Diagnosis Date  . Diabetes mellitus   . Hypertension   . Sebaceous cyst   . Anemia   . Allergic rhinitis   . Hypertension   . Rheumatoid arthritis   . Diabetic retinopathy(362.0)   . Acute osteomyelitis   . Esophageal ulcer   . Esophageal stenosis   . Gastroparesis   . Renal insufficiency   . Diabetes mellitus   . GERD (gastroesophageal reflux disease)   . Hyperlipidemia   . Depression   . Anxiety   . Heart attack   . Polysubstance abuse   . Esophagitis   . CAD (coronary artery disease) 12/12    s/p DES mid and distal RCA with 50% LAD    PSH:    Past Surgical History  Procedure Laterality Date  . Cardiac catheterization    . Coronary stent placement  12/2008    ALLERGIES:   Aspirin and Codeine  Prior to Admit Meds:   (Not in a hospital admission) Family HX:    Family History  Problem Relation Age of Onset  . Breast cancer Mother 68  . Hypertension Father   . Stomach cancer Sister   . Hypertension  Sister   . Colon cancer Neg Hx    Social HX:    History   Social History  . Marital Status: Married    Spouse Name: N/A    Number of Children: 1  . Years of Education: N/A   Occupational History  . Disabled     Social History Main Topics  . Smoking status: Current Every Day Smoker -- 0.50 packs/day    Types: Cigarettes  . Smokeless tobacco: Never Used  . Alcohol Use: No  . Drug Use: No  . Sexually Active: Not on file   Other Topics Concern  . Not on file   Social History Narrative   0 caffeine drinks daily      ROS:  All 11 ROS were addressed and are negative except what is stated in the HPI  PHYSICAL EXAM Filed Vitals:   12/19/12 0800  BP: 112/63  Pulse: 92  Temp:   Resp:    General: Well developed, well nourished, in no acute distress, drowsy Head: Eyes PERRLA, No xanthomas.   Normal cephalic and atramatic  Lungs:   Bibasilar crackles Heart:   HRRR S1 S2  Abdomen: Bowel sounds are positive, abdomen soft and non-tender without masses or  Hernia's noted. Msk:   Normal strength and tone for age. Extremities:   No edema.   Neuro: Alert and oriented X 3. Psych:  Normal affect, responds appropriately   Labs:   Lab Results  Component Value Date   WBC 15.5* 12/19/2012   HGB 10.0* 12/19/2012   HCT 29.5* 12/19/2012   MCV 89.1 12/19/2012   PLT 272 12/19/2012    Recent Labs Lab 12/19/12 0225  NA 135  K 3.8  CL 101  CO2 21  BUN 23  CREATININE 0.99  CALCIUM 8.9  GLUCOSE 213*   Lab Results  Component Value Date   CKTOTAL 40 02/09/2011   CKMB 1.2 02/09/2011   TROPONINI 1.10* 12/19/2012   No results found for this basename: PTT   Lab Results  Component Value Date   INR 0.93 12/19/2012   INR 0.97 07/02/2012   INR 0.90 11/09/2011     Lab Results  Component Value Date   CHOL  Value: 184        ATP III CLASSIFICATION:  <200     mg/dL   Desirable  161-096  mg/dL   Borderline [place]  >=045    mg/dL   [place]        4/0/9811   CHOL 183 08/25/2010    CHOL  Value: 113        ATP III CLASSIFICATION:  <200     mg/dL   Desirable  914-782  mg/dL   Borderline [place]  >=956    mg/dL   [place]        09/13/863   Lab Results  Component Value Date   HDL 67 09/05/2010   HDL 57 08/25/2010   HDL 37* 01/14/2009   Lab Results  Component Value Date   LDLCALC  Value: 107        Total Cholesterol/HDL:CHD Risk Coronary Heart Disease Risk Table                     Men   Women  1/2 Average Risk   3.4   3.3  Average Risk       5.0   4.4  2 X Average Risk   9.6   7.1  3 X Average Risk  23.4   11.0        Use the calculated Patient Ratio above and the CHD Risk Table to determine the patient's CHD Risk.        ATP III CLASSIFICATION (LDL):  <100     mg/dL   Optimal  784-696  mg/dL   Near or Above                    Optimal  130-159  mg/dL   Borderline  295-284  mg/dL   [place]  >132     mg/dL   Very [place]* 11/01/100   LDLCALC 101* 08/25/2010   LDLCALC  Value: 63        Total Cholesterol/HDL:CHD Risk Coronary Heart Disease Risk Table                     Men   Women  1/2 Average Risk   3.4   3.3  Average Risk       5.0   4.4  2 X Average Risk   9.6   7.1  3 X Average Risk  23.4   11.0        Use the calculated Patient Ratio above and the  CHD Risk Table to determine the patient's CHD Risk.        ATP III CLASSIFICATION (LDL):  <100     mg/dL   Optimal  096-045  mg/dL   Near or Above                    Optimal  130-159  mg/dL   Borderline  409-811  mg/dL   [place]  >914     mg/dL   Very [place] 7/82/9562   Lab Results  Component Value Date   TRIG 52 09/05/2010   TRIG 123 08/25/2010   TRIG 66 01/14/2009   Lab Results  Component Value Date   CHOLHDL 2.7 09/05/2010   CHOLHDL 3.2 Ratio 08/25/2010   CHOLHDL 3.1 01/14/2009   No results found for this basename: LDLDIRECT      Radiology:  @RISRSLT24 @  EKG:  Normal sinus rhythm, inferior T wave inversions  ASSESSMENT: Non-ST elevation MI.  Elevated BNP suggestive of fluid overload, diabetes  PLAN:  Given her chest discomfort and positive  troponin, we'll plan for cardiac cath.  The procedure was explained to the patient and she is agreeable.  Continue heparin and nitroglycerin for now.  Continue beta blocker, aspirin, Plavix, statin.  She has had complex RCA disease in the past.  She had moderate LAD disease in the past.  Nothing obvious on ECG indicating where her ischemia may be.  Adjust insulin for her n.p.o. status.  Blood pressure well controlled at this time.  May need diuresis for fluid overload given her chest x-ray findings.  Her white count is mildly elevated.  Corky Crafts., MD  12/19/2012  8:31 AM

## 2012-12-19 NOTE — ED Notes (Signed)
Pt assisted to use bed pan.

## 2012-12-19 NOTE — ED Notes (Signed)
EDP to attempt ultrasound guided IV.  

## 2012-12-19 NOTE — ED Notes (Signed)
Checked patient cbg it was 40 notified RN Berkley of blood sugar

## 2012-12-19 NOTE — CV Procedure (Signed)
PROCEDURE:  Left heart catheterization with selective coronary angiography, left ventriculogram.  PCI of left circumflex.  INDICATIONS:  NSTEMI  The risks, benefits, and details of the procedure were explained to the patient.  The patient verbalized understanding and wanted to proceed.  Informed written consent was obtained.  PROCEDURE TECHNIQUE:  After Xylocaine anesthesia a 30F sheath was placed in the right radial artery with a single anterior needle wall stick.   Left coronary angiography was done using a Judkins L3.0 guide catheter.  Right coronary angiography was done using a Judkins R4 guide catheter.  Left ventriculography was done using a pigtail catheter.    CONTRAST:  Total of 130 cc.  COMPLICATIONS:  None.    HEMODYNAMICS:  Aortic pressure was 143/84; LV pressure was 143/22; LVEDP 30.  There was no gradient between the left ventricle and aorta.    ANGIOGRAPHIC DATA:   The left main coronary artery is widely patent.  The left anterior descending artery is a large vessel which wraps around the apex.  There is mild diffuse atherosclerosis.  There is a large first diagonal which is widely patent.  There is a small patent second diagonal.  There are collaterals to the right system.  The left circumflex artery is a large vessel.  The first obtuse marginal is medium-sized and widely patent.  The second obtuse marginal is small and patent.  After 2 atrial branches, there is a focal 90% stenosis.  Just proximal to this, there is moderate diffuse disease.  There are collaterals to the right system.  The right coronary artery is occluded in the midportion.  In the proximal portion, there is a diffuse 80-90% stenosis.  There is a large RV marginal branch proximally.  LEFT VENTRICULOGRAM:  Left ventricular angiogram was done in the 30 RAO projection and revealed basal inferior hypokinesis and overall mildly decreased systolic function with an estimated ejection fraction of 40-45 %.  LVEDP was  30 mmHg.  PCI NARRATIVE: A 5 French JL 3.0 guide catheter was used to engage the left main.  A pro-water wire was placed across the area of disease in the left circumflex.  A 2.5 x 20 balloon was used to predilate the entire area disease.  A 2.75 x 23 expedition drug-eluting stent was deployed at 14 atmospheres.  The stent was postdilated with a 3.25 x 20 noncompliant balloon to 14 atmospheres.  There is an excellent angiographic result with TIMI 3 flow maintained throughout.  There is no residual stenosis.  The patient was pain-free at the end of the procedure.  IMPRESSIONS:  1. Normal left main coronary artery. 2. No significant disease in the left anterior descending artery and its branches. 3. 90% lesion in the mid left circumflex artery.  This appears to be a ruptured plaque and was successfully stented with a 2.75 x 23 drug-eluting stent, postdilated to 3.3 mm in diameter. 4. Occluded mid right coronary artery, which appears chronic.  The entire distal stented segment appears occluded.  The distal RCA system has faint collaterals from the left system. 5. Mildly decreased left ventricular systolic function.  LVEDP 30 mmHg.  Ejection fraction 40-45 %.  RECOMMENDATION:  She needs to remain on Plavix.  Her LVEDP is increased.  We will diuresis her.  She will need medical therapy for decreased LV function.  She'll be watched overnight.

## 2012-12-19 NOTE — ED Notes (Signed)
PT. REPORTS MID CHEST PAIN / PAIN UNDER BREASTS WITH SON AND NAUSEA FOR 3 DAYS , SLIGHT SOB WITH OCCASIONAL DRY COUGH . PT. STATED HISTORY OF CAD / CORONARY STENT , DR. VARANASI IS HER CARDIOLOGIST.

## 2012-12-19 NOTE — Progress Notes (Signed)
TR BAND REMOVAL  LOCATION:  right radial  DEFLATED PER PROTOCOL:  yes  TIME BAND OFF / DRESSING APPLIED:   1645   SITE UPON ARRIVAL:   Level 0  SITE AFTER BAND REMOVAL:  Level 0  REVERSE ALLEN'S TEST:    positive  CIRCULATION SENSATION AND MOVEMENT:  Within Normal Limits  yes  COMMENTS:    

## 2012-12-19 NOTE — ED Provider Notes (Signed)
History     CSN: [id number]  Arrival date & time 12/19/12  0217   First MD Initiated Contact with Patient 12/19/12 [phone]      Chief Complaint  Patient presents with  . Chest Pain    (Consider location/radiation/quality/duration/timing/severity/associated sxs/prior treatment) HPI Comments: Patient with history of CAD with stents.  Presents with complaints of "I think I have pneumonia".  She reports sharp pains in her back, cough, feels fevered.  No nausea, diaphoresis and this feels different that her prior cardiac pain.    Patient is a 47 y.o. female presenting with chest pain. The history is provided by the patient.  Chest Pain Pain location:  L lateral chest and R lateral chest Pain quality: sharp   Pain radiates to:  Does not radiate Pain radiates to the back: no   Pain severity:  Moderate Onset quality:  Gradual Duration:  3 days Timing:  Constant Progression:  Worsening Chronicity:  New Context: breathing   Relieved by:  Nothing Worsened by:  Coughing, movement and certain positions   Past Medical History  Diagnosis Date  . Diabetes mellitus   . Hypertension   . Sebaceous cyst   . Anemia   . Allergic rhinitis   . Hypertension   . Rheumatoid arthritis   . Diabetic retinopathy(362.0)   . Acute osteomyelitis   . Esophageal ulcer   . Esophageal stenosis   . Gastroparesis   . Renal insufficiency   . Diabetes mellitus   . GERD (gastroesophageal reflux disease)   . Hyperlipidemia   . Depression   . Anxiety   . Heart attack   . Polysubstance abuse   . Esophagitis   . CAD (coronary artery disease) 12/12    s/p DES mid and distal RCA with 50% LAD    Past Surgical History  Procedure Laterality Date  . Cardiac catheterization    . Coronary stent placement  12/2008    Family History  Problem Relation Age of Onset  . Breast cancer Mother 55  . Hypertension Father   . Stomach cancer Sister   . Hypertension Sister   . Colon cancer Neg Hx     History   Substance Use Topics  . Smoking status: Current Every Day Smoker -- 0.50 packs/day    Types: Cigarettes  . Smokeless tobacco: Never Used  . Alcohol Use: No    OB History   Grav Para Term Preterm Abortions TAB SAB Ect Mult Living                  Review of Systems  Cardiovascular: Positive for chest pain.  All other systems reviewed and are negative.    Allergies  Aspirin and Codeine  Home Medications   Current Outpatient Rx  Name  Route  Sig  Dispense  Refill  . atorvastatin (LIPITOR) 40 MG tablet   Oral   Take 40 mg by mouth every morning.         . clopidogrel (PLAVIX) 75 MG tablet   Oral   Take 75 mg by mouth daily.           . ferrous sulfate 325 (65 FE) MG tablet   Oral   Take 325 mg by mouth daily with breakfast.           . folic acid (FOLVITE) 1 MG tablet   Oral   Take 1 mg by mouth daily.           . insulin aspart protamine-insulin aspart (  NOVOLOG 70/30) (70-30) 100 UNIT/ML injection   Subcutaneous   Inject 4-20 Units into the skin 2 (two) times daily. Sliding scale as directed         . insulin glargine (LANTUS) 100 UNIT/ML injection   Subcutaneous   Inject 14 Units into the skin 2 (two) times daily.          . methotrexate (RHEUMATREX) 2.5 MG tablet   Oral   Take 15 mg by mouth once a week. Wednesday. Caution:Chemotherapy. Protect from light.          . metoCLOPramide (REGLAN) 10 MG tablet   Oral   Take 10 mg by mouth 2 (two) times daily.          . metoprolol tartrate (LOPRESSOR) 25 MG tablet   Oral   Take 25 mg by mouth 2 (two) times daily.           . nitroGLYCERIN (NITROSTAT) 0.4 MG SL tablet   Sublingual   Place 0.4 mg under the tongue every 5 (five) minutes as needed. For chest pain         . sertraline (ZOLOFT) 100 MG tablet   Oral   Take 100 mg by mouth daily.           . traMADol (ULTRAM) 50 MG tablet   Oral   Take 50 mg by mouth every 6 (six) hours as needed. For pain         . EXPIRED:  pantoprazole (PROTONIX) 40 MG tablet   Oral   Take 40 mg by mouth daily at 6 (six) AM.           BP 116/57  Pulse 101  Temp(Src) 99.4 F (37.4 C) (Oral)  Resp 18  SpO2 96%  LMP 12/19/2012  Physical Exam  Nursing note and vitals reviewed. Constitutional: She is oriented to person, place, and time. She appears well-developed and well-nourished. No distress.  HENT:  Head: Normocephalic and atraumatic.  Neck: Normal range of motion. Neck supple.  Cardiovascular: Normal rate and regular rhythm.  Exam reveals no gallop and no friction rub.   No murmur heard. Pulmonary/Chest: Effort normal and breath sounds normal. No respiratory distress. She has no wheezes.  Abdominal: Soft. Bowel sounds are normal. She exhibits no distension. There is no tenderness.  Musculoskeletal: Normal range of motion.  Neurological: She is alert and oriented to person, place, and time.  Skin: Skin is warm and dry. She is not diaphoretic.    ED Course  Procedures (including critical care time)  Labs Reviewed  CBC  BASIC METABOLIC PANEL  PRO B NATRIURETIC PEPTIDE  PROTIME-INR   No results found.   No diagnosis found.   Date: 12/19/2012  Rate: 101  Rhythm: sinus tachycardia  QRS Axis: normal  Intervals: normal  ST/T Wave abnormalities: nonspecific T wave changes  Conduction Disutrbances:none  Narrative Interpretation:   Old EKG Reviewed: none available    MDM  The patient presents with symptoms of chest pain, cough, and shortness of breath.  She has a history of cardiac stenting in 2012 by Dr. Eldridge Dace.  Today's symptoms seem somewhat atypical for cardiac pain, however labs did return with a troponin of 1.1, bnp of 5600, and patchy infiltrates on the chest xray suspicious for chf.  I have spoken with Dr. Katrinka Blazing from Cardiology who will see the patient in the ED.  He recommends a ntg drip.          Geoffery Lyons, MD 12/19/12 [phone]

## 2012-12-19 NOTE — ED Notes (Signed)
IV team to assess for access.

## 2012-12-19 NOTE — Progress Notes (Signed)
Utilization Review Completed Diarra Kos J. Erich Kochan, RN, BSN, NCM [phone]  

## 2012-12-20 LAB — BASIC METABOLIC PANEL
CO2: 24 mEq/L (ref 19–32)
Calcium: 8.6 mg/dL (ref 8.4–10.5)
Chloride: 95 mEq/L — ABNORMAL LOW (ref 96–112)
Glucose, Bld: 441 mg/dL — ABNORMAL HIGH (ref 70–99)
Potassium: 4.3 mEq/L (ref 3.5–5.1)
Sodium: 129 mEq/L — ABNORMAL LOW (ref 135–145)

## 2012-12-20 LAB — GLUCOSE, CAPILLARY
Glucose-Capillary: 362 mg/dL — ABNORMAL HIGH (ref 70–99)
Glucose-Capillary: 278 mg/dL — ABNORMAL HIGH (ref 70–99)

## 2012-12-20 LAB — CBC
Hemoglobin: 10.6 g/dL — ABNORMAL LOW (ref 12.0–15.0)
Platelets: 298 10*3/uL (ref 150–400)
RBC: 3.49 MIL/uL — ABNORMAL LOW (ref 3.87–5.11)
WBC: 10 10*3/uL (ref 4.0–10.5)

## 2012-12-20 MED ORDER — POTASSIUM CHLORIDE CRYS ER 20 MEQ PO TBCR
40.0000 meq | EXTENDED_RELEASE_TABLET | Freq: Once | ORAL | Status: AC
  Administered 2012-12-20: 40 meq via ORAL
  Filled 2012-12-20: qty 2

## 2012-12-20 MED ORDER — FUROSEMIDE 40 MG PO TABS: 40.0000 mg | ORAL_TABLET | Freq: Every day | ORAL | Status: DC

## 2012-12-20 MED ORDER — FUROSEMIDE 10 MG/ML IJ SOLN
20.0000 mg | Freq: Once | INTRAMUSCULAR | Status: DC
  Filled 2012-12-20: qty 2

## 2012-12-20 MED ORDER — FUROSEMIDE 40 MG PO TABS
40.0000 mg | ORAL_TABLET | Freq: Every day | ORAL | Status: DC
  Filled 2012-12-20: qty 1

## 2012-12-20 MED ORDER — POTASSIUM CHLORIDE CRYS ER 20 MEQ PO TBCR: 20.0000 meq | EXTENDED_RELEASE_TABLET | Freq: Every day | ORAL | Status: DC

## 2012-12-20 MED ORDER — FUROSEMIDE 10 MG/ML IJ SOLN
40.0000 mg | Freq: Once | INTRAMUSCULAR | Status: AC
  Administered 2012-12-20: 03:00:00 40 mg via INTRAVENOUS
  Filled 2012-12-20: qty 4

## 2012-12-20 MED ORDER — FUROSEMIDE 10 MG/ML IJ SOLN
40.0000 mg | Freq: Once | INTRAMUSCULAR | Status: AC
  Administered 2012-12-20: 09:00:00 40 mg via INTRAVENOUS
  Filled 2012-12-20: qty 4

## 2012-12-20 NOTE — Discharge Summary (Addendum)
Patient IDHermela [name] MRN: [id number] DOB/AGE: 1965-11-03 47 y.o.  Admit date: 12/19/2012 Discharge date: 12/20/2012  Primary Discharge Diagnosis NSTEMI, acute systolic heart failure Secondary Discharge DiagnosisCAD, DM, obesity, prior MI  Significant Diagnostic Studies: angiography: cath showing CTO of RCA.  Ruptured plaque in the Circ, stented with a 2.75 x 23 Drug eluting stent.  LVEF 40-45%. Inferior Hypokinesis.  Consults: None  Hospital Course: 47 y/o who had chest pain.  Troponin was positive so she was brought to the cath lab.  She had a successful PCI.  The day after, she had decreased oxygen sats with walking.  She was diuresed over the course of the day.  Oxygen sats improved.  SHe felt well enough to be discharged.  Infiltrates on xray were likely edema.BP was low at times.  Start ACE-I as OP when BP better.  Pharmacy said no contraindication to medicine for fibroids with plavix.  Lasix started as outpatient.   Discharge Exam: Blood pressure 105/71, pulse 77, temperature 98.1 F (36.7 C), temperature source Oral, resp. rate 24, height 5\' 4"  (1.626 m), weight 91.6 kg (201 lb 15.1 oz), last menstrual period 12/19/2012, SpO2 87.00%.   [place]/AT RRR s1s2 Basilar crackles Soft NT 2+ right radial pulse No edema  Labs:   Lab Results  Component Value Date   WBC 10.0 12/20/2012   HGB 10.6* 12/20/2012   HCT 31.4* 12/20/2012   MCV 90.0 12/20/2012   PLT 298 12/20/2012    Recent Labs Lab 12/20/12 0410  NA 129*  K 4.3  CL 95*  CO2 24  BUN 20  CREATININE 0.92  CALCIUM 8.6  GLUCOSE 441*   Lab Results  Component Value Date   CKTOTAL 40 02/09/2011   CKMB 1.2 02/09/2011   TROPONINI 1.10* 12/19/2012    Lab Results  Component Value Date   CHOL  Value: 184        ATP III CLASSIFICATION:  <200     mg/dL   Desirable  409-811  mg/dL   Borderline [place]  >=914    mg/dL   [place]        02/05/2955   CHOL 183 08/25/2010   CHOL  Value: 113        ATP III CLASSIFICATION:  <200     mg/dL    Desirable  213-086  mg/dL   Borderline [place]  >=578    mg/dL   [place]        4/69/6295   Lab Results  Component Value Date   HDL 67 09/05/2010   HDL 57 08/25/2010   HDL 37* 01/14/2009   Lab Results  Component Value Date   LDLCALC  Value: 107        Total Cholesterol/HDL:CHD Risk Coronary Heart Disease Risk Table                     Men   Women  1/2 Average Risk   3.4   3.3  Average Risk       5.0   4.4  2 X Average Risk   9.6   7.1  3 X Average Risk  23.4   11.0        Use the calculated Patient Ratio above and the CHD Risk Table to determine the patient's CHD Risk.        ATP III CLASSIFICATION (LDL):  <100     mg/dL   Optimal  284-132  mg/dL   Near or Above  Optimal  130-159  mg/dL   Borderline  469-629  mg/dL   [place]  >528     mg/dL   Very [place]* 10/30/3242   LDLCALC 101* 08/25/2010   LDLCALC  Value: 63        Total Cholesterol/HDL:CHD Risk Coronary Heart Disease Risk Table                     Men   Women  1/2 Average Risk   3.4   3.3  Average Risk       5.0   4.4  2 X Average Risk   9.6   7.1  3 X Average Risk  23.4   11.0        Use the calculated Patient Ratio above and the CHD Risk Table to determine the patient's CHD Risk.        ATP III CLASSIFICATION (LDL):  <100     mg/dL   Optimal  010-272  mg/dL   Near or Above                    Optimal  130-159  mg/dL   Borderline  536-644  mg/dL   [place]  >034     mg/dL   Very [place] 7/42/5956   Lab Results  Component Value Date   TRIG 52 09/05/2010   TRIG 123 08/25/2010   TRIG 66 01/14/2009   Lab Results  Component Value Date   CHOLHDL 2.7 09/05/2010   CHOLHDL 3.2 Ratio 08/25/2010   CHOLHDL 3.1 01/14/2009   No results found for this basename: LDLDIRECT      Radiology: bilateral infiltrates EKG:NSR inferior t wave inversions  FOLLOW UP PLANS AND APPOINTMENTS Discharge Orders   Future Orders Complete By Expires     Amb Referral to Cardiac Rehabilitation  As directed         Medication List    TAKE these medications        atorvastatin 40 MG tablet  Commonly known as:  LIPITOR  Take 40 mg by mouth every morning.     clopidogrel 75 MG tablet  Commonly known as:  PLAVIX  Take 75 mg by mouth daily.     ferrous sulfate 325 (65 FE) MG tablet  Take 325 mg by mouth daily with breakfast.     folic acid 1 MG tablet  Commonly known as:  FOLVITE  Take 1 mg by mouth daily.     furosemide 40 MG tablet  Commonly known as:  LASIX  Take 1 tablet (40 mg total) by mouth daily.     insulin aspart protamine- aspart (70-30) 100 UNIT/ML injection  Commonly known as:  NOVOLOG 70/30  Inject 4-20 Units into the skin 2 (two) times daily. Sliding scale as directed     insulin glargine 100 UNIT/ML injection  Commonly known as:  LANTUS  Inject 14 Units into the skin 2 (two) times daily.     methotrexate 2.5 MG tablet  Commonly known as:  RHEUMATREX  Take 15 mg by mouth once a week. Wednesday. Caution:Chemotherapy. Protect from light.     metoCLOPramide 10 MG tablet  Commonly known as:  REGLAN  Take 10 mg by mouth 2 (two) times daily.     metoprolol tartrate 25 MG tablet  Commonly known as:  LOPRESSOR  Take 25 mg by mouth 2 (two) times daily.     nitroGLYCERIN 0.4 MG SL tablet  Commonly known as:  NITROSTAT  Place 0.4  mg under the tongue every 5 (five) minutes as needed. For chest pain     pantoprazole 40 MG tablet  Commonly known as:  PROTONIX  Take 40 mg by mouth daily at 6 (six) AM.     potassium chloride SA 20 MEQ tablet  Commonly known as:  K-DUR,KLOR-CON  Take 1 tablet (20 mEq total) by mouth daily.     sertraline 100 MG tablet  Commonly known as:  ZOLOFT  Take 100 mg by mouth daily.     traMADol 50 MG tablet  Commonly known as:  ULTRAM  Take 50 mg by mouth every 6 (six) hours as needed. For pain           Follow-up Information   Follow up with Corky Crafts., MD. Schedule an appointment as soon as possible for a visit in 2 weeks.   Contact information:   [address] [place] [address] [phone]       BRING ALL MEDICATIONS WITH YOU TO FOLLOW UP APPOINTMENTS  Time spent with patient to include physician time:25 minutes Signed: Alline Pio S. 12/20/2012, 4:35 PM

## 2012-12-20 NOTE — Progress Notes (Signed)
CARDIAC REHAB PHASE I   PRE:  Rate/Rhythm: 80SR  BP:  Supine:   Sitting: 120/57  Standing:    SaO2: above 90% RA  Took it off bathing and had not put back on.  MODE:  Ambulation: 600 ft   POST:  Rate/Rhythm: 100SR  BP:  Supine:   Sitting: 137/70  Standing:    SaO2: 79-80%RA, 85% rest, 95% 3L 0750-0905  Pt walked 669ft on RA with minimal asst with steady gait. Denied CP. Appeared slightly SOB after walk and sats low. Put back on 3L to get sats above 90%. Education completed. Pt counseled on smoking cessation and handouts given. Gave CHF booklet and encouraged pt to get scales so she can weigh daily. She said she could afford to get scales. Discussed when to call MD re weight gain, low sodium foods, and signs to observe for. Gave diabetic and heart healthy diets also. Pt has attended CRP 2 twice and permission given to refer to [place] again. Cardiologist and Rn aware of low sats. MI ed completed also.   Luetta Nutting, RN BSN  12/20/2012 8:58 AM

## 2012-12-20 NOTE — Progress Notes (Signed)
0845 Patients cbg 362 per sliding scale 15 units of novolog indicated. Patient stated she would only take 10 units of novolog for a blood sugar of 362 according to her home sliding scale. Discussed with Dr. Eldridge Dace and patient given  10 units novolog as instructed by Dr. Eldridge Dace

## 2012-12-29 ENCOUNTER — Encounter (HOSPITAL_COMMUNITY): Payer: Self-pay | Admitting: *Deleted

## 2012-12-29 ENCOUNTER — Emergency Department (HOSPITAL_COMMUNITY): Payer: Medicaid Other

## 2012-12-29 ENCOUNTER — Inpatient Hospital Stay (HOSPITAL_COMMUNITY)
Admission: EM | Admit: 2012-12-29 | Discharge: 2013-01-01 | DRG: 074 | Disposition: A | Discharge status: Home / Self Care | Payer: Medicaid Other | Attending: Internal Medicine | Admitting: Internal Medicine

## 2012-12-29 DIAGNOSIS — E162 Hypoglycemia, unspecified: Secondary | ICD-10-CM

## 2012-12-29 DIAGNOSIS — E785 Hyperlipidemia, unspecified: Secondary | ICD-10-CM | POA: Diagnosis present

## 2012-12-29 DIAGNOSIS — F17200 Nicotine dependence, unspecified, uncomplicated: Secondary | ICD-10-CM | POA: Diagnosis present

## 2012-12-29 DIAGNOSIS — M25519 Pain in unspecified shoulder: Secondary | ICD-10-CM

## 2012-12-29 DIAGNOSIS — I2129 ST elevation (STEMI) myocardial infarction involving other sites: Secondary | ICD-10-CM

## 2012-12-29 DIAGNOSIS — Z79899 Other long term (current) drug therapy: Secondary | ICD-10-CM

## 2012-12-29 DIAGNOSIS — S99929A Unspecified injury of unspecified foot, initial encounter: Secondary | ICD-10-CM

## 2012-12-29 DIAGNOSIS — I252 Old myocardial infarction: Secondary | ICD-10-CM

## 2012-12-29 DIAGNOSIS — R1115 Cyclical vomiting syndrome unrelated to migraine: Secondary | ICD-10-CM

## 2012-12-29 DIAGNOSIS — E1143 Type 2 diabetes mellitus with diabetic autonomic (poly)neuropathy: Secondary | ICD-10-CM

## 2012-12-29 DIAGNOSIS — K2210 Ulcer of esophagus without bleeding: Secondary | ICD-10-CM

## 2012-12-29 DIAGNOSIS — L723 Sebaceous cyst: Secondary | ICD-10-CM

## 2012-12-29 DIAGNOSIS — Z7902 Long term (current) use of antithrombotics/antiplatelets: Secondary | ICD-10-CM

## 2012-12-29 DIAGNOSIS — K219 Gastro-esophageal reflux disease without esophagitis: Secondary | ICD-10-CM | POA: Diagnosis present

## 2012-12-29 DIAGNOSIS — E109 Type 1 diabetes mellitus without complications: Secondary | ICD-10-CM

## 2012-12-29 DIAGNOSIS — D649 Anemia, unspecified: Secondary | ICD-10-CM

## 2012-12-29 DIAGNOSIS — K222 Esophageal obstruction: Secondary | ICD-10-CM

## 2012-12-29 DIAGNOSIS — M25579 Pain in unspecified ankle and joints of unspecified foot: Secondary | ICD-10-CM

## 2012-12-29 DIAGNOSIS — R110 Nausea: Secondary | ICD-10-CM

## 2012-12-29 DIAGNOSIS — F3289 Other specified depressive episodes: Secondary | ICD-10-CM | POA: Diagnosis present

## 2012-12-29 DIAGNOSIS — Z8673 Personal history of transient ischemic attack (TIA), and cerebral infarction without residual deficits: Secondary | ICD-10-CM

## 2012-12-29 DIAGNOSIS — I2510 Atherosclerotic heart disease of native coronary artery without angina pectoris: Secondary | ICD-10-CM

## 2012-12-29 DIAGNOSIS — K5289 Other specified noninfective gastroenteritis and colitis: Secondary | ICD-10-CM | POA: Diagnosis present

## 2012-12-29 DIAGNOSIS — J40 Bronchitis, not specified as acute or chronic: Secondary | ICD-10-CM

## 2012-12-29 DIAGNOSIS — R112 Nausea with vomiting, unspecified: Secondary | ICD-10-CM

## 2012-12-29 DIAGNOSIS — G43909 Migraine, unspecified, not intractable, without status migrainosus: Secondary | ICD-10-CM | POA: Diagnosis present

## 2012-12-29 DIAGNOSIS — Z794 Long term (current) use of insulin: Secondary | ICD-10-CM

## 2012-12-29 DIAGNOSIS — M25539 Pain in unspecified wrist: Secondary | ICD-10-CM

## 2012-12-29 DIAGNOSIS — E1139 Type 2 diabetes mellitus with other diabetic ophthalmic complication: Secondary | ICD-10-CM | POA: Diagnosis present

## 2012-12-29 DIAGNOSIS — K529 Noninfective gastroenteritis and colitis, unspecified: Secondary | ICD-10-CM

## 2012-12-29 DIAGNOSIS — R109 Unspecified abdominal pain: Secondary | ICD-10-CM

## 2012-12-29 DIAGNOSIS — R3989 Other symptoms and signs involving the genitourinary system: Secondary | ICD-10-CM | POA: Diagnosis present

## 2012-12-29 DIAGNOSIS — K3184 Gastroparesis: Secondary | ICD-10-CM | POA: Diagnosis present

## 2012-12-29 DIAGNOSIS — I10 Essential (primary) hypertension: Secondary | ICD-10-CM | POA: Diagnosis present

## 2012-12-29 DIAGNOSIS — A088 Other specified intestinal infections: Secondary | ICD-10-CM

## 2012-12-29 DIAGNOSIS — J019 Acute sinusitis, unspecified: Secondary | ICD-10-CM

## 2012-12-29 DIAGNOSIS — E11319 Type 2 diabetes mellitus with unspecified diabetic retinopathy without macular edema: Secondary | ICD-10-CM | POA: Diagnosis present

## 2012-12-29 DIAGNOSIS — I219 Acute myocardial infarction, unspecified: Secondary | ICD-10-CM

## 2012-12-29 DIAGNOSIS — J309 Allergic rhinitis, unspecified: Secondary | ICD-10-CM

## 2012-12-29 DIAGNOSIS — M069 Rheumatoid arthritis, unspecified: Secondary | ICD-10-CM

## 2012-12-29 DIAGNOSIS — F329 Major depressive disorder, single episode, unspecified: Secondary | ICD-10-CM

## 2012-12-29 DIAGNOSIS — Z9861 Coronary angioplasty status: Secondary | ICD-10-CM

## 2012-12-29 DIAGNOSIS — M86179 Other acute osteomyelitis, unspecified ankle and foot: Secondary | ICD-10-CM

## 2012-12-29 DIAGNOSIS — N179 Acute kidney failure, unspecified: Secondary | ICD-10-CM | POA: Diagnosis present

## 2012-12-29 DIAGNOSIS — L84 Corns and callosities: Secondary | ICD-10-CM

## 2012-12-29 DIAGNOSIS — R197 Diarrhea, unspecified: Secondary | ICD-10-CM | POA: Diagnosis present

## 2012-12-29 DIAGNOSIS — E860 Dehydration: Secondary | ICD-10-CM | POA: Diagnosis present

## 2012-12-29 DIAGNOSIS — N189 Chronic kidney disease, unspecified: Secondary | ICD-10-CM

## 2012-12-29 DIAGNOSIS — E1149 Type 2 diabetes mellitus with other diabetic neurological complication: Principal | ICD-10-CM | POA: Diagnosis present

## 2012-12-29 DIAGNOSIS — F1910 Other psychoactive substance abuse, uncomplicated: Secondary | ICD-10-CM

## 2012-12-29 DIAGNOSIS — F411 Generalized anxiety disorder: Secondary | ICD-10-CM | POA: Diagnosis present

## 2012-12-29 DIAGNOSIS — M2550 Pain in unspecified joint: Secondary | ICD-10-CM

## 2012-12-29 DIAGNOSIS — R079 Chest pain, unspecified: Secondary | ICD-10-CM

## 2012-12-29 HISTORY — DX: Nausea with vomiting, unspecified: R11.2

## 2012-12-29 LAB — HEMOGLOBIN A1C: Mean Plasma Glucose: 151 mg/dL — ABNORMAL HIGH (ref <117)

## 2012-12-29 LAB — GLUCOSE, CAPILLARY
Glucose-Capillary: 215 mg/dL — ABNORMAL HIGH (ref 70–99)
Glucose-Capillary: 175 mg/dL — ABNORMAL HIGH (ref 70–99)

## 2012-12-29 LAB — CBC
Platelets: 599 10*3/uL — ABNORMAL HIGH (ref 150–400)
RDW: 16.3 % — ABNORMAL HIGH (ref 11.5–15.5)
WBC: 18.2 10*3/uL — ABNORMAL HIGH (ref 4.0–10.5)

## 2012-12-29 LAB — POCT I-STAT, CHEM 8
Calcium, Ion: 1.15 mmol/L (ref 1.12–1.23)
Chloride: 103 mEq/L (ref 96–112)
Creatinine, Ser: 1.1 mg/dL (ref 0.50–1.10)
Glucose, Bld: 261 mg/dL — ABNORMAL HIGH (ref 70–99)
HCT: 42 % (ref 36.0–46.0)

## 2012-12-29 LAB — POCT I-STAT 3, ART BLOOD GAS (G3+)
Acid-Base Excess: 7 mmol/L — ABNORMAL HIGH (ref 0.0–2.0)
Bicarbonate: 28.5 mEq/L — ABNORMAL HIGH (ref 20.0–24.0)
O2 Saturation: 61 %
Patient temperature: 98.6

## 2012-12-29 LAB — ETHANOL: Alcohol, Ethyl (B): <11 mg/dL (ref 0–11)

## 2012-12-29 MED ORDER — OXYCODONE HCL 5 MG PO TABS
5.0000 mg | ORAL_TABLET | ORAL | Status: DC | PRN
  Administered 2012-12-29 – 2013-01-01 (×7): 5 mg via ORAL
  Filled 2012-12-29 (×8): qty 1

## 2012-12-29 MED ORDER — SODIUM CHLORIDE 0.9 % IV SOLN
INTRAVENOUS | Status: DC
  Administered 2012-12-29: 10:00:00 via INTRAVENOUS

## 2012-12-29 MED ORDER — SODIUM CHLORIDE 0.9 % IV SOLN
INTRAVENOUS | Status: DC
  Administered 2012-12-29 – 2013-01-01 (×4): via INTRAVENOUS

## 2012-12-29 MED ORDER — NITROGLYCERIN 0.4 MG SL SUBL: 0.4000 mg | SUBLINGUAL_TABLET | SUBLINGUAL | Status: DC | PRN

## 2012-12-29 MED ORDER — ATORVASTATIN CALCIUM 40 MG PO TABS
40.0000 mg | ORAL_TABLET | ORAL | Status: DC
  Filled 2012-12-29 (×3): qty 1

## 2012-12-29 MED ORDER — FERROUS SULFATE 325 (65 FE) MG PO TABS
325.0000 mg | ORAL_TABLET | Freq: Every day | ORAL | Status: DC
Start: 2012-12-30 — End: 2012-12-30
  Filled 2012-12-29 (×2): qty 1

## 2012-12-29 MED ORDER — MORPHINE SULFATE 2 MG/ML IJ SOLN
1.0000 mg | INTRAMUSCULAR | Status: DC | PRN
  Administered 2012-12-29 – 2012-12-30 (×3): 1 mg via INTRAVENOUS
  Filled 2012-12-29 (×3): qty 1

## 2012-12-29 MED ORDER — HYDROMORPHONE HCL PF 1 MG/ML IJ SOLN
1.0000 mg | Freq: Once | INTRAMUSCULAR | Status: AC
  Administered 2012-12-29: 1 mg via INTRAVENOUS
  Filled 2012-12-29: qty 1

## 2012-12-29 MED ORDER — METOPROLOL TARTRATE 25 MG PO TABS
25.0000 mg | ORAL_TABLET | Freq: Two times a day (BID) | ORAL | Status: DC
  Administered 2012-12-29 – 2013-01-01 (×7): 25 mg via ORAL
  Filled 2012-12-29 (×8): qty 1

## 2012-12-29 MED ORDER — SERTRALINE HCL 100 MG PO TABS
100.0000 mg | ORAL_TABLET | Freq: Every day | ORAL | Status: DC
  Administered 2012-12-29 – 2013-01-01 (×4): 100 mg via ORAL
  Filled 2012-12-29 (×4): qty 1

## 2012-12-29 MED ORDER — INSULIN ASPART 100 UNIT/ML ~~LOC~~ SOLN
0.0000 [IU] | Freq: Three times a day (TID) | SUBCUTANEOUS | Status: DC
  Administered 2012-12-29: 2 [IU] via SUBCUTANEOUS
  Administered 2012-12-29: 7 [IU] via SUBCUTANEOUS
  Administered 2012-12-30: 9 [IU] via SUBCUTANEOUS

## 2012-12-29 MED ORDER — SODIUM CHLORIDE 0.9 % IJ SOLN
3.0000 mL | Freq: Two times a day (BID) | INTRAMUSCULAR | Status: DC
  Administered 2012-12-30 (×2): 3 mL via INTRAVENOUS

## 2012-12-29 MED ORDER — ONDANSETRON HCL 4 MG PO TABS
4.0000 mg | ORAL_TABLET | Freq: Four times a day (QID) | ORAL | Status: DC | PRN
  Administered 2012-12-31: 4 mg via ORAL
  Filled 2012-12-29: qty 1

## 2012-12-29 MED ORDER — PROMETHAZINE HCL 25 MG/ML IJ SOLN
25.0000 mg | Freq: Four times a day (QID) | INTRAMUSCULAR | Status: DC | PRN
  Administered 2012-12-29 – 2013-01-01 (×9): 25 mg via INTRAVENOUS
  Filled 2012-12-29 (×10): qty 1

## 2012-12-29 MED ORDER — HEPARIN SODIUM (PORCINE) 5000 UNIT/ML IJ SOLN
5000.0000 [IU] | Freq: Three times a day (TID) | INTRAMUSCULAR | Status: DC
  Administered 2012-12-29 – 2013-01-01 (×6): 5000 [IU] via SUBCUTANEOUS
  Filled 2012-12-29 (×12): qty 1

## 2012-12-29 MED ORDER — ONDANSETRON HCL 4 MG/2ML IJ SOLN
4.0000 mg | Freq: Four times a day (QID) | INTRAMUSCULAR | Status: DC | PRN
  Administered 2012-12-30: 4 mg via INTRAVENOUS
  Filled 2012-12-29 (×2): qty 2

## 2012-12-29 MED ORDER — PANTOPRAZOLE SODIUM 40 MG IV SOLR
40.0000 mg | INTRAVENOUS | Status: DC
  Administered 2012-12-30 – 2012-12-31 (×2): 40 mg via INTRAVENOUS
  Filled 2012-12-29 (×4): qty 40

## 2012-12-29 MED ORDER — SODIUM CHLORIDE 0.9 % IV BOLUS (SEPSIS)
1000.0000 mL | Freq: Once | INTRAVENOUS | Status: AC
  Administered 2012-12-29: 1000 mL via INTRAVENOUS

## 2012-12-29 MED ORDER — FOLIC ACID 1 MG PO TABS
1.0000 mg | ORAL_TABLET | Freq: Every day | ORAL | Status: DC
  Filled 2012-12-29 (×2): qty 1

## 2012-12-29 MED ORDER — ONDANSETRON 4 MG PO TBDP
ORAL_TABLET | ORAL | Status: AC
  Filled 2012-12-29: qty 2

## 2012-12-29 MED ORDER — ONDANSETRON 4 MG PO TBDP
8.0000 mg | ORAL_TABLET | Freq: Once | ORAL | Status: AC
  Administered 2012-12-29: 8 mg via ORAL

## 2012-12-29 MED ORDER — ACETAMINOPHEN 650 MG RE SUPP: 650.0000 mg | Freq: Four times a day (QID) | RECTAL | Status: DC | PRN

## 2012-12-29 MED ORDER — POTASSIUM CHLORIDE CRYS ER 20 MEQ PO TBCR
40.0000 meq | EXTENDED_RELEASE_TABLET | Freq: Four times a day (QID) | ORAL | Status: AC
  Administered 2012-12-29 (×2): 40 meq via ORAL
  Filled 2012-12-29 (×2): qty 2

## 2012-12-29 MED ORDER — PROMETHAZINE HCL 25 MG/ML IJ SOLN
25.0000 mg | Freq: Once | INTRAMUSCULAR | Status: AC
  Administered 2012-12-29: 25 mg via INTRAMUSCULAR
  Filled 2012-12-29: qty 1

## 2012-12-29 MED ORDER — ACETAMINOPHEN 325 MG PO TABS
650.0000 mg | ORAL_TABLET | Freq: Four times a day (QID) | ORAL | Status: DC | PRN
  Administered 2012-12-31: 650 mg via ORAL
  Filled 2012-12-29: qty 2

## 2012-12-29 MED ORDER — CLOPIDOGREL BISULFATE 75 MG PO TABS
75.0000 mg | ORAL_TABLET | Freq: Every day | ORAL | Status: DC
  Administered 2012-12-29 – 2013-01-01 (×4): 75 mg via ORAL
  Filled 2012-12-29 (×4): qty 1

## 2012-12-29 MED ORDER — POTASSIUM CHLORIDE 10 MEQ/100ML IV SOLN
10.0000 meq | Freq: Once | INTRAVENOUS | Status: AC
  Administered 2012-12-29: 10 meq via INTRAVENOUS
  Filled 2012-12-29: qty 100

## 2012-12-29 MED ORDER — PANTOPRAZOLE SODIUM 40 MG IV SOLR
40.0000 mg | Freq: Once | INTRAVENOUS | Status: AC
  Administered 2012-12-29: 40 mg via INTRAVENOUS
  Filled 2012-12-29: qty 40

## 2012-12-29 MED ORDER — INSULIN GLARGINE 100 UNIT/ML ~~LOC~~ SOLN
14.0000 [IU] | Freq: Every day | SUBCUTANEOUS | Status: DC
  Administered 2012-12-29 – 2012-12-31 (×3): 14 [IU] via SUBCUTANEOUS
  Filled 2012-12-29 (×4): qty 0.14

## 2012-12-29 MED ORDER — METHOTREXATE 2.5 MG PO TABS
15.0000 mg | ORAL_TABLET | ORAL | Status: DC
  Administered 2013-01-01: 15 mg via ORAL
  Filled 2012-12-29: qty 6

## 2012-12-29 MED ORDER — ONDANSETRON HCL 4 MG/2ML IJ SOLN
4.0000 mg | Freq: Once | INTRAMUSCULAR | Status: AC
  Administered 2012-12-29: 4 mg via INTRAVENOUS
  Filled 2012-12-29: qty 2

## 2012-12-29 NOTE — ED Notes (Addendum)
Pt c/o chest pain that started this morning. Pt describes pain as heavy pressure. Reports diarrhea starting this morning and nausea/vomiting since last. Pt c/o upper middle quad abd pain starting this am. Hx of MI and stent placement last week. Pt clammy/diaphoretic, unable to find a position that relieves pain. Pt states she "drank a six pack" last nt.

## 2012-12-29 NOTE — ED Notes (Signed)
Pt was helped to bedpan and had one small well formed BM

## 2012-12-29 NOTE — H&P (Addendum)
[facility] History and Physical  [name] [name] ZOX:[id number] DOB: [birth year] DOA: 12/29/2012  Referring physician: Sunnie Nielsen, MD PCP: Lonia Blood, MD  Specialists: Jim Like  Chief Complaint: Nausea and vomiting  HPI: [name] [name] is a 47 y.o. female with past medical history of CAD status post recent stent to the mid left circumflex artery on 12/19/2012. Patient came in to the hospital because of nausea and vomiting. She said yesterday she drank about 6 beers and she ate Chinese shrimp fried rice, later in the day she started to have nausea and vomiting as well as diarrhea. She has abdominal discomfort with bloating. Patient said she vomits everything she eats. She came to the hospital for further evaluation. She was found to have mild dehydration and prerenal azotemia, admitted to the hospital for further evaluation.  Review of Systems:  Constitutional: negative for anorexia, fevers and sweats Eyes: negative for irritation, redness and visual disturbance Ears, nose, mouth, throat, and face: negative for earaches, epistaxis, nasal congestion and sore throat Respiratory: negative for cough, dyspnea on exertion, sputum and wheezing Cardiovascular: negative for chest pain, dyspnea, lower extremity edema, orthopnea, palpitations and syncope Gastrointestinal: Per HPI Genitourinary:negative for dysuria, frequency and hematuria Hematologic/lymphatic: negative for bleeding, easy bruising and lymphadenopathy Musculoskeletal:negative for arthralgias, muscle weakness and stiff joints Neurological: negative for coordination problems, gait problems, headaches and weakness Endocrine: negative for diabetic symptoms including polydipsia, polyuria and weight loss Allergic/Immunologic: negative for anaphylaxis, hay fever and urticaria   Past Medical History  Diagnosis Date  . Hypertension   . Sebaceous cyst   . Anemia   . Allergic rhinitis   . Hypertension   . Diabetic retinopathy   . Acute  osteomyelitis   . Esophageal ulcer   . Esophageal stenosis   . Gastroparesis   . Renal insufficiency   . GERD (gastroesophageal reflux disease)   . Hyperlipidemia   . Depression   . Anxiety   . Polysubstance abuse   . Esophagitis   . CAD (coronary artery disease) 12/12    s/p DES mid and distal RCA with 50% LAD  . Heart murmur   . Anginal pain   . Pneumonia 2012  . Orthopnoea   . Type II diabetes mellitus   . Inferior MI 01/20/2009    /notes on 12/19/2012, "that's the only one I've had" (12/19/2012)  . Acute myocardial infarction, unspecified site, initial episode of care   . Acute myocardial infarction of other lateral wall, initial episode of care   . Daily headache   . Migraines   . Stroke 2011    denies residual on 12/19/2012  . Rheumatoid arthritis(714.0)   . Bipolar affective   . History of stomach ulcers    Past Surgical History  Procedure Laterality Date  . Coronary angioplasty with stent placement  01/20/2009    "2" (12/19/2012)  . Coronary angioplasty with stent placement  2012    "2" (12/19/2012)  . Coronary angioplasty with stent placement  12/19/2012    "2" (12/19/2012)  . Irrigation and debridement sebaceous cyst Right 03/2011    "pointer" (12/19/2012)   Social History:  reports that she has been smoking Cigarettes.  She has a 11 pack-year smoking history. She has never used smokeless tobacco. She reports that  drinks alcohol. She reports that she uses illicit drugs ("Crack" cocaine and Marijuana).   Allergies  Allergen Reactions  . Aspirin Other (See Comments)    Stomach bleeds  . Codeine Hives and Itching    Family History  Problem  Relation Age of Onset  . Breast cancer Mother 46  . Hypertension Father   . Stomach cancer Sister   . Hypertension Sister   . Colon cancer Neg Hx     Prior to Admission medications   Medication Sig Start Date End Date Taking? Authorizing Provider  atorvastatin (LIPITOR) 40 MG tablet Take 40 mg by mouth every morning.     Historical Provider, MD  clopidogrel (PLAVIX) 75 MG tablet Take 75 mg by mouth daily.      Historical Provider, MD  ferrous sulfate 325 (65 FE) MG tablet Take 325 mg by mouth daily with breakfast.      Historical Provider, MD  folic acid (FOLVITE) 1 MG tablet Take 1 mg by mouth daily.      Historical Provider, MD  furosemide (LASIX) 40 MG tablet Take 1 tablet (40 mg total) by mouth daily. 12/20/12   Corky Crafts, MD  insulin aspart protamine-insulin aspart (NOVOLOG 70/30) (70-30) 100 UNIT/ML injection Inject 4-20 Units into the skin 2 (two) times daily. Sliding scale as directed    Historical Provider, MD  insulin glargine (LANTUS) 100 UNIT/ML injection Inject 14 Units into the skin 2 (two) times daily.     Historical Provider, MD  methotrexate (RHEUMATREX) 2.5 MG tablet Take 15 mg by mouth once a week. Wednesday. Caution:Chemotherapy. Protect from light.     Historical Provider, MD  metoCLOPramide (REGLAN) 10 MG tablet Take 10 mg by mouth 2 (two) times daily.     Historical Provider, MD  metoprolol tartrate (LOPRESSOR) 25 MG tablet Take 25 mg by mouth 2 (two) times daily.      Historical Provider, MD  nitroGLYCERIN (NITROSTAT) 0.4 MG SL tablet Place 0.4 mg under the tongue every 5 (five) minutes as needed. For chest pain    Historical Provider, MD  pantoprazole (PROTONIX) 40 MG tablet Take 40 mg by mouth daily at 6 (six) AM. 07/07/11 07/06/12  Corky Crafts, MD  potassium chloride SA (K-DUR,KLOR-CON) 20 MEQ tablet Take 1 tablet (20 mEq total) by mouth daily. 12/20/12   Corky Crafts, MD  sertraline (ZOLOFT) 100 MG tablet Take 100 mg by mouth daily.      Historical Provider, MD  traMADol (ULTRAM) 50 MG tablet Take 50 mg by mouth every 6 (six) hours as needed. For pain    Historical Provider, MD   Physical Exam: Filed Vitals:   12/29/12 0515 12/29/12 0716 12/29/12 0834 12/29/12 0924  BP: 117/77 134/65 132/49 124/77  Pulse: 108 98  112  Temp:    98.8 F (37.1 C)  TempSrc:     Oral  Resp: 36 14 18 18   SpO2:  98% 93% 95%  General appearance: alert, cooperative and no distress  Head: Normocephalic, without obvious abnormality, atraumatic  Eyes: conjunctivae/corneas clear. PERRL, EOM's intact. Fundi benign.  Nose: Nares normal. Septum midline. Mucosa normal. No drainage or sinus tenderness.  Throat: lips, mucosa, and tongue normal; teeth and gums normal  Neck: Supple, no masses, no cervical lymphadenopathy, no JVD appreciated, no meningeal signs Resp: clear to auscultation bilaterally  Chest wall: no tenderness  Cardio: regular rate and rhythm, S1, S2 normal, no murmur, click, rub or gallop  GI: soft, non-tender; bowel sounds normal; no masses, no organomegaly  Extremities: extremities normal, atraumatic, no cyanosis or edema  Skin: Skin color, texture, turgor normal. No rashes or lesions  Neurologic: Alert and oriented X 3, normal strength and tone. Normal symmetric reflexes. Normal coordination and gait  Labs on  Admission:  Basic Metabolic Panel:  Recent Labs Lab 12/29/12 0714  NA 142  K 3.1*  CL 103  GLUCOSE 261*  BUN 28*  CREATININE 1.10   Liver Function Tests: No results found for this basename: AST, ALT, ALKPHOS, BILITOT, PROT, ALBUMIN,  in the last 168 hours  Recent Labs Lab 12/29/12 0700  LIPASE 14   No results found for this basename: AMMONIA,  in the last 168 hours CBC:  Recent Labs Lab 12/29/12 0700 12/29/12 0714  WBC 18.2*  --   HGB 12.9 14.3  HCT 37.5 42.0  MCV 87.2  --   PLT 599*  --    Cardiac Enzymes: No results found for this basename: CKTOTAL, CKMB, CKMBINDEX, TROPONINI,  in the last 168 hours  BNP (last 3 results)  Recent Labs  07/02/12 2211 12/19/12 0225  PROBNP 666.2* 5770.0*   CBG:  Recent Labs Lab 12/29/12 0621  GLUCAP 215*    Radiological Exams on Admission: Dg Chest Portable 1 View  12/29/2012   *RADIOLOGY REPORT*  Clinical Data: Chest pain  PORTABLE CHEST - 1 VIEW  Comparison: 12/19/2012   Findings: Patchy opacities noted on the prior study have essentially resolved, with possible minimal residual lingular opacity / scarring.  No pleural effusion or pneumothorax.  The heart is normal in size.  IMPRESSION: Patchy opacities noted on the prior study have essentially resolved.  Possible minimal residual lingular opacity / scarring.   Original Report Authenticated By: Charline Bills, M.D.    EKG: Independently reviewed.   Assessment/Plan Principal Problem:   Nausea and vomiting Active Problems:   IDDM   Dehydration   ARTHRITIS, RHEUMATOID   Gastroenteritis   Nausea and vomiting Likely transient gastroenteritis, ingestion of fried rice and alcohol yesterday. Can not rule out alcoholic gastritis, patient is a hard stick no LFTs were drawn in the emergency department. There was concern also about flareup of gastroparesis she had problem with it before. I will symptomatically with antiemetics and hydrated with IV fluids. Check her CMP and CBC in a.m.  Dehydration Likely secondary to GI losses and poor oral intake. Hydrate with IV fluids, check BMP in a.m.  Hypokalemia Try to replete orally, if patient vomits potassium supplements this can be parenterally.  IDDM She is on 14 units of Lantus twice a day. We'll give half of the dose. She is on clear liquids now, when she tolerates that we will put her back on her regular dose of insulin. Check A1c, utilize insulin sliding scale.  Rheumatoid arthritis She is on methotrexate, continue preadmission home medications.  CAD with recent non-STEMI Patient is on metoprolol and Plavix, continue. She is allergic to aspirin.   Code Status: Full code Family Communication: Plan discussed with patient Disposition Plan: Observation, telemetry  Time spent: 70 minutes  [facility] Pager [phone]  If 7PM-7AM, please contact night-coverage [email or web address] Password [facility] 12/29/2012, 10:49 AM

## 2012-12-29 NOTE — ED Notes (Signed)
Attempted report 

## 2012-12-29 NOTE — ED Provider Notes (Signed)
History     CSN: [id number]  Arrival date & time 12/29/12  0422   First MD Initiated Contact with Patient 12/29/12 [phone]      Chief complaint is epigastric pain   Patient is a 47 y.o. female presenting with chest pain.  Chest Pain Associated symptoms: abdominal pain   Associated symptoms: no back pain, no fever, no headache and no shortness of breath    History provided by patient. His diabetic with history of gastroparesis and GERD who presents with sharp epigastric pain and associated nausea and vomiting, multiple episodes onset tonight. She is brought in by EMS with very poor peripheral IV access. She denies any blood in her emesis, she admits to drinking a few beers earlier tonight. She has history of coronary artery disease, MI and stents. She has had similar symptoms to this in the past with her gastroparesis. Pain is severe and not radiating. No associated shortness of breath. No trauma. No rash. No fevers. No known alleviating factors Past Medical History  Diagnosis Date  . Hypertension   . Sebaceous cyst   . Anemia   . Allergic rhinitis   . Hypertension   . Diabetic retinopathy   . Acute osteomyelitis   . Esophageal ulcer   . Esophageal stenosis   . Gastroparesis   . Renal insufficiency   . GERD (gastroesophageal reflux disease)   . Hyperlipidemia   . Depression   . Anxiety   . Polysubstance abuse   . Esophagitis   . CAD (coronary artery disease) 12/12    s/p DES mid and distal RCA with 50% LAD  . Heart murmur   . Anginal pain   . Pneumonia 2012  . Orthopnoea   . Type II diabetes mellitus   . Inferior MI 01/20/2009    /notes on 12/19/2012, "that's the only one I've had" (12/19/2012)  . Acute myocardial infarction, unspecified site, initial episode of care   . Acute myocardial infarction of other lateral wall, initial episode of care   . Daily headache   . Migraines   . Stroke 2011    denies residual on 12/19/2012  . Rheumatoid arthritis(714.0)   . Bipolar  affective   . History of stomach ulcers     Past Surgical History  Procedure Laterality Date  . Coronary angioplasty with stent placement  01/20/2009    "2" (12/19/2012)  . Coronary angioplasty with stent placement  2012    "2" (12/19/2012)  . Coronary angioplasty with stent placement  12/19/2012    "2" (12/19/2012)  . Irrigation and debridement sebaceous cyst Right 03/2011    "pointer" (12/19/2012)    Family History  Problem Relation Age of Onset  . Breast cancer Mother 89  . Hypertension Father   . Stomach cancer Sister   . Hypertension Sister   . Colon cancer Neg Hx     History  Substance Use Topics  . Smoking status: Current Every Day Smoker -- 0.50 packs/day for 22 years    Types: Cigarettes  . Smokeless tobacco: Never Used  . Alcohol Use: Yes     Comment: 12/19/2012 "1 cooler/wk since mother's day; clean 6 years before that"    OB History   Grav Para Term Preterm Abortions TAB SAB Ect Mult Living                  Review of Systems  Constitutional: Negative for fever and chills.  HENT: Negative for neck pain and neck stiffness.   Eyes: Negative  for pain.  Respiratory: Negative for shortness of breath.   Cardiovascular: Negative for chest pain.  Gastrointestinal: Positive for abdominal pain.  Genitourinary: Negative for dysuria.  Musculoskeletal: Negative for back pain.  Skin: Negative for rash.  Neurological: Negative for headaches.  All other systems reviewed and are negative.    Allergies  Aspirin and Codeine  Home Medications   Current Outpatient Rx  Name  Route  Sig  Dispense  Refill  . atorvastatin (LIPITOR) 40 MG tablet   Oral   Take 40 mg by mouth every morning.         . clopidogrel (PLAVIX) 75 MG tablet   Oral   Take 75 mg by mouth daily.           . ferrous sulfate 325 (65 FE) MG tablet   Oral   Take 325 mg by mouth daily with breakfast.           . folic acid (FOLVITE) 1 MG tablet   Oral   Take 1 mg by mouth daily.            . furosemide (LASIX) 40 MG tablet   Oral   Take 1 tablet (40 mg total) by mouth daily.   30 tablet   11   . insulin aspart protamine-insulin aspart (NOVOLOG 70/30) (70-30) 100 UNIT/ML injection   Subcutaneous   Inject 4-20 Units into the skin 2 (two) times daily. Sliding scale as directed         . insulin glargine (LANTUS) 100 UNIT/ML injection   Subcutaneous   Inject 14 Units into the skin 2 (two) times daily.          . methotrexate (RHEUMATREX) 2.5 MG tablet   Oral   Take 15 mg by mouth once a week. Wednesday. Caution:Chemotherapy. Protect from light.          . metoCLOPramide (REGLAN) 10 MG tablet   Oral   Take 10 mg by mouth 2 (two) times daily.          . metoprolol tartrate (LOPRESSOR) 25 MG tablet   Oral   Take 25 mg by mouth 2 (two) times daily.           . nitroGLYCERIN (NITROSTAT) 0.4 MG SL tablet   Sublingual   Place 0.4 mg under the tongue every 5 (five) minutes as needed. For chest pain         . EXPIRED: pantoprazole (PROTONIX) 40 MG tablet   Oral   Take 40 mg by mouth daily at 6 (six) AM.         . potassium chloride SA (K-DUR,KLOR-CON) 20 MEQ tablet   Oral   Take 1 tablet (20 mEq total) by mouth daily.   30 tablet   11   . sertraline (ZOLOFT) 100 MG tablet   Oral   Take 100 mg by mouth daily.           . traMADol (ULTRAM) 50 MG tablet   Oral   Take 50 mg by mouth every 6 (six) hours as needed. For pain           Temp(Src) 97.6 F (36.4 C) (Oral)  SpO2 88%  LMP 12/19/2012  Physical Exam  Constitutional: She is oriented to person, place, and time. She appears well-developed and well-nourished.  HENT:  Head: Normocephalic and atraumatic.  Eyes: EOM are normal. Pupils are equal, round, and reactive to light.  Neck: Neck supple.  Cardiovascular: Normal rate, regular rhythm and  intact distal pulses.   Pulmonary/Chest: Effort normal and breath sounds normal. No respiratory distress.  Abdominal: Soft. She exhibits no  distension. There is no rebound and no guarding.  Obese. Tender epigastric. neg Murphy's sign  Musculoskeletal: Normal range of motion. She exhibits no edema.  Neurological: She is alert and oriented to person, place, and time.  Skin: Skin is warm and dry.    ED Course  Procedures (including critical care time)   Results for orders placed during the hospital encounter of 12/29/12  CBC      Result Value Range   WBC 18.2 (*) 4.0 - 10.5 K/uL   RBC 4.30  3.87 - 5.11 MIL/uL   Hemoglobin 12.9  12.0 - 15.0 g/dL   HCT 45.4  09.8 - 11.9 %   MCV 87.2  78.0 - 100.0 fL   MCH 30.0  26.0 - 34.0 pg   MCHC 34.4  30.0 - 36.0 g/dL   RDW 14.7 (*) 82.9 - 56.2 %   Platelets 599 (*) 150 - 400 K/uL  LIPASE, BLOOD      Result Value Range   Lipase 14  11 - 59 U/L  GLUCOSE, CAPILLARY      Result Value Range   Glucose-Capillary 215 (*) 70 - 99 mg/dL  ETHANOL      Result Value Range   Alcohol, Ethyl (B) <11  0 - 11 mg/dL  POCT I-STAT 3, BLOOD GAS (G3+)      Result Value Range   pH, Arterial 7.569 (*) 7.350 - 7.450   pCO2 arterial 31.1 (*) 35.0 - 45.0 mmHg   pO2, Arterial 26.0 (*) 80.0 - 100.0 mmHg   Bicarbonate 28.5 (*) 20.0 - 24.0 mEq/L   TCO2 29  0 - 100 mmol/L   O2 Saturation 61.0     Acid-Base Excess 7.0 (*) 0.0 - 2.0 mmol/L   Patient temperature 98.6 F     Collection site RADIAL, ALLEN'S TEST ACCEPTABLE     Drawn by RT     Sample type ARTERIAL     Comment NOTIFIED PHYSICIAN    POCT I-STAT, CHEM 8      Result Value Range   Sodium 142  135 - 145 mEq/L   Potassium 3.1 (*) 3.5 - 5.1 mEq/L   Chloride 103  96 - 112 mEq/L   BUN 28 (*) 6 - 23 mg/dL   Creatinine, Ser 1.30  0.50 - 1.10 mg/dL   Glucose, Bld 865 (*) 70 - 99 mg/dL   Calcium, Ion 7.84  6.96 - 1.23 mmol/L   TCO2 28  0 - 100 mmol/L   Hemoglobin 14.3  12.0 - 15.0 g/dL   HCT 29.5  28.4 - 13.2 %  POCT I-STAT TROPONIN I      Result Value Range   Troponin i, poc 0.00  0.00 - 0.08 ng/mL   Comment 3            Dg Chest 2  View  12/19/2012   *RADIOLOGY REPORT*  Clinical Data: Cough and congestion.  CHEST - 2 VIEW  Comparison: 07/02/2012.  Findings: The cardiac silhouette, mediastinal and hilar contours are within normal limits and stable.  There are patchy bilateral infiltrates.  No pleural effusion.  The bony thorax is intact.  IMPRESSION: Patchy bilateral lung infiltrates.   Original Report Authenticated By: Rudie Meyer, M.D.      Date: 12/29/2012  Rate: 104  Rhythm: sinus tachycardia  QRS Axis: normal  Intervals: normal  ST/T Wave abnormalities:  nonspecific ST/T changes  Conduction Disutrbances:none  Narrative Interpretation:   Old EKG Reviewed: unchanged  Very poor peripheral IV access. EMS unable to establish IV. Multiple nurses attempted establishing IVs without success. I evaluated patient for peripheral IV was able to find any. IV team evaluated patient and decision made to place peripheral in her foot. IV fluids provided. IV narcotics. Antiemetics.  8:07 AM condition unchanged. Medicine consult - and admit triad Dr. Arthor Captain MDM  Epigastric pain with vomiting in a patient with diabetes and history of gastroparesis. Screening EKG. labs. Imaging.  IV fluids. IV narcotics. Antiemetics. Serial evaluations  MED admit    Sunnie Nielsen, MD 12/29/12 [phone]

## 2012-12-29 NOTE — ED Notes (Signed)
Pt had BM in bed.  Pt was cleaned and folloowing chaninging linens, pt stated she need to have another BM.  Pt placed on bedpan and given call bell

## 2012-12-30 LAB — GLUCOSE, CAPILLARY: Glucose-Capillary: 207 mg/dL — ABNORMAL HIGH (ref 70–99)

## 2012-12-30 LAB — COMPREHENSIVE METABOLIC PANEL
ALT: 6 U/L (ref 0–35)
AST: 13 U/L (ref 0–37)
AST: 14 U/L (ref 0–37)
Albumin: 3.3 g/dL — ABNORMAL LOW (ref 3.5–5.2)
Albumin: 3.6 g/dL (ref 3.5–5.2)
Alkaline Phosphatase: 182 U/L — ABNORMAL HIGH (ref 39–117)
Chloride: 102 mEq/L (ref 96–112)
Creatinine, Ser: 0.85 mg/dL (ref 0.50–1.10)
Glucose, Bld: 304 mg/dL — ABNORMAL HIGH (ref 70–99)
Potassium: 4.7 mEq/L (ref 3.5–5.1)
Potassium: 3.8 mEq/L (ref 3.5–5.1)
Sodium: 141 mEq/L (ref 135–145)
Total Bilirubin: 0.5 mg/dL (ref 0.3–1.2)
Total Protein: 7.8 g/dL (ref 6.0–8.3)
Total Protein: 8.6 g/dL — ABNORMAL HIGH (ref 6.0–8.3)

## 2012-12-30 MED ORDER — MORPHINE SULFATE 2 MG/ML IJ SOLN
2.0000 mg | INTRAMUSCULAR | Status: DC | PRN
  Administered 2012-12-30 (×3): 2 mg via INTRAVENOUS
  Filled 2012-12-30 (×3): qty 1

## 2012-12-30 MED ORDER — INSULIN ASPART 100 UNIT/ML ~~LOC~~ SOLN
0.0000 [IU] | SUBCUTANEOUS | Status: DC
Start: 2012-12-30 — End: 2013-01-01
  Administered 2012-12-30 (×2): 3 [IU] via SUBCUTANEOUS
  Administered 2012-12-30: 5 [IU] via SUBCUTANEOUS
  Administered 2012-12-31 (×2): 3 [IU] via SUBCUTANEOUS
  Administered 2012-12-31: 2 [IU] via SUBCUTANEOUS
  Administered 2012-12-31: 5 [IU] via SUBCUTANEOUS
  Administered 2013-01-01 (×2): 3 [IU] via SUBCUTANEOUS
  Administered 2013-01-01 (×2): 2 [IU] via SUBCUTANEOUS

## 2012-12-30 NOTE — Progress Notes (Signed)
Inpatient Diabetes Program Recommendations  AACE/ADA: New Consensus Statement on Inpatient Glycemic Control (2013)  Target Ranges:  Prepandial:   less than 140 mg/dL      Peak postprandial:   less than 180 mg/dL (1-2 hours)      Critically ill patients:  140 - 180 mg/dL     Results for [name] [name], [name] [name] (MRN [id number]) as of 12/30/2012 11:45  Ref. Range 12/29/2012 06:21 12/29/2012 12:08 12/29/2012 16:47 12/29/2012 21:17  Glucose-Capillary Latest Range: 70-99 mg/dL 086 (H) 578 (H) 469 (H) 312 (H)    Results for [name] [name], [name] [name] (MRN [id number]) as of 12/30/2012 11:45  Ref. Range 12/30/2012 07:24  Glucose-Capillary Latest Range: 70-99 mg/dL 244 (H)    **Patient currently only receiving 14 units Lantus QHS here in hospital (1/2 home dose)  **Noted Novolog Sensitive correction scale changed to Q4 hour coverage today (was ordered tid)  Recommend the following: 1. Please add a morning dose of Lantus to patient's current regimen- Lantus 7 units QAM 2. Continue Lantus 14 units QHS   Will follow. Ambrose Finland RN, MSN, CDE Diabetes Coordinator Inpatient Diabetes Program [phone]

## 2012-12-30 NOTE — Progress Notes (Signed)
PROGRESS NOTE  [name] [name] JXB:[id number] DOB: January 18, 1966 DOA: 12/29/2012 PCP: Lonia Blood, MD  Brief narrative: 47 year old female admitted recently by [facility] cardiology for NSTEMI STATUS post stent mid L. Circ and CHF re presented to the emergency room 12/29/2012 with abdominal pain-presented with nausea vomiting and had 6 beers and ate Chinese fried rice later in the day and was noted to have nausea vomiting and some diarrhea  Past medical history-As per Problem list Chart reviewed as below- Admission 12/17/2012 for chest pain resulting in cardiac stent placement Admission 07/02/2012 for chest pain rule out Admission 07/06/2011 for chest pain with PCI to the RCA Admission 05/15/2004 for polysubstance abuse our card pancreatitis DKA2 diabetes mellitus history CVA 2000 Multiple admissions 2003 for DKA  Consultants:  none  Procedures:  Chest x-ray portable 1 view = patchy opacities and depressed very essentially resolved minimal lingular  Antibiotics:  None currently   Subjective  States still having some nausea and vomiting-nursing reports not taking much by mouth liquids by mouth.  No fever no chills no diarrhea currently Still has abdominal pain which is about the same as prior   Objective    Interim History: none  Telemetry:    Objective: Filed Vitals:   12/29/12 0924 12/29/12 1328 12/29/12 2000 12/30/12 0344  BP: 124/77 122/73 143/62 155/78  Pulse: 112 120 108 93  Temp: 98.8 F (37.1 C) 99.3 F (37.4 C) 99.2 F (37.3 C) 99.2 F (37.3 C)  TempSrc: Oral Oral    Resp: 18 18 18 18   SpO2: 95% 93% 95% 96%    Intake/Output Summary (Last 24 hours) at 12/30/12 1006 Last data filed at 12/30/12 0900  Gross per 24 hour  Intake 2452.08 ml  Output   1800 ml  Net 652.08 ml    Exam:  General: Alert sleepy appearing no apparent distress, trying to drink water in the room when I went in Cardiovascular: S1-S2 no murmur rub or gallop Respiratory: Clinically Abdomen: Soft  slightly tender in upper epigastric Skin no lower extremity edema rash Neuro grossly  Data Reviewed: Basic Metabolic Panel:  Recent Labs Lab 12/29/12 0714 12/30/12 0527  NA 142 138  K 3.1* 4.7  CL 103 102  CO2  --  24  GLUCOSE 261* 447*  BUN 28* 16  CREATININE 1.10 0.85  CALCIUM  --  9.1   Liver Function Tests:  Recent Labs Lab 12/30/12 0527  AST 13  ALT <5  ALKPHOS 160*  BILITOT 0.5  PROT 7.8  ALBUMIN 3.3*    Recent Labs Lab 12/29/12 0700  LIPASE 14   No results found for this basename: AMMONIA,  in the last 168 hours CBC:  Recent Labs Lab 12/29/12 0700 12/29/12 0714  WBC 18.2*  --   HGB 12.9 14.3  HCT 37.5 42.0  MCV 87.2  --   PLT 599*  --    Cardiac Enzymes: No results found for this basename: CKTOTAL, CKMB, CKMBINDEX, TROPONINI,  in the last 168 hours BNP: No components found with this basename: POCBNP,  CBG:  Recent Labs Lab 12/29/12 0621 12/29/12 1208 12/29/12 1647 12/29/12 2117 12/30/12 0724  GLUCAP 215* 338* 175* 312* 361*    No results found for this or any previous visit (from the past 240 hour(s)).   Studies:              All Imaging reviewed and is as per above notation   Scheduled Meds: . atorvastatin  40 mg Oral BH-q7a  . clopidogrel  75  mg Oral Daily  . ferrous sulfate  325 mg Oral Q breakfast  . folic acid  1 mg Oral Daily  . heparin  5,000 Units Subcutaneous Q8H  . insulin aspart  0-9 Units Subcutaneous TID WC  . insulin glargine  14 Units Subcutaneous QHS  . [START ON 01/01/2013] methotrexate  15 mg Oral Weekly  . metoprolol tartrate  25 mg Oral BID  . pantoprazole (PROTONIX) IV  40 mg Intravenous Q24H  . sertraline  100 mg Oral Daily  . sodium chloride  3 mL Intravenous Q12H   Continuous Infusions: . sodium chloride 125 mL/hr at 12/29/12 1803     Assessment/Plan: 1. Abdominal pain-potential gastroenteritis or alcoholic gastritis versus gallstones-suspect the former rather than bladder she also is burning in  her chest and reflux symptomatology. We will keep her on clear liquids today and monitor.   The patient is also Plavix and this may cause gastric irritation. Continue Phenergan 25 IV every 6 when necessary, IV morphine 1 mg every 4 when necessary, oxycodone every 4 when necessary moderate pain. Minimize nonessential medications) folic acid ETT ETT 2. History of recent end STEMI with stent placement-continue Plavix and 5 daily and metoprolol 25 twice a day 3. Acute kidney injury-baseline creatinine is 13/0.77 2012, on admission was 28/1.1-creatinine is trended down with IV fluids. 4. Elevated alkaline phosphatase--patient on methotrexate unclear if this is the cause. If persisting, we'll need to get imaging of the abdomen potentially CT with contrast. Suspect less likely secondary to cholelithiasis, pancreatitis etc as lipase was 14 on admission yesterday, LFTs otherwise are normal 5. Diabetes mellitus type 2-poorly controlled.  The sugar is 200 300-early this morning was 440.  She is not eating well will continue current regimen of Lantus 14 units daily as well as SSI q4hrly 6. History of rheumatoid arthritis continue methotrexate at current dose 15 mg weekly 7. Depression continue sertraline 100 mg daily  Code Status: Full Family Communication: None at bedside Disposition Plan: Inpatient   Pleas Koch, MD  [facility] Pager [phone] 12/30/2012, 10:06 AM    LOS: 1 day

## 2012-12-31 ENCOUNTER — Encounter (HOSPITAL_COMMUNITY): Payer: Self-pay | Admitting: General Practice

## 2012-12-31 LAB — COMPREHENSIVE METABOLIC PANEL
ALT: 5 U/L (ref 0–35)
AST: 13 U/L (ref 0–37)
Calcium: 8.9 mg/dL (ref 8.4–10.5)
GFR calc Af Amer: >90 mL/min (ref >90)
Sodium: 140 mEq/L (ref 135–145)
Total Protein: 8.1 g/dL (ref 6.0–8.3)

## 2012-12-31 LAB — GLUCOSE, CAPILLARY
Glucose-Capillary: 198 mg/dL — ABNORMAL HIGH (ref 70–99)
Glucose-Capillary: 254 mg/dL — ABNORMAL HIGH (ref 70–99)
Glucose-Capillary: 170 mg/dL — ABNORMAL HIGH (ref 70–99)
Glucose-Capillary: 216 mg/dL — ABNORMAL HIGH (ref 70–99)

## 2012-12-31 LAB — CBC
MCH: 29.7 pg (ref 26.0–34.0)
MCHC: 32.9 g/dL (ref 30.0–36.0)
Platelets: 520 10*3/uL — ABNORMAL HIGH (ref 150–400)

## 2012-12-31 MED ORDER — METOCLOPRAMIDE HCL 5 MG PO TABS
5.0000 mg | ORAL_TABLET | Freq: Three times a day (TID) | ORAL | Status: DC
  Administered 2012-12-31 – 2013-01-01 (×4): 5 mg via ORAL
  Filled 2012-12-31 (×7): qty 1

## 2012-12-31 NOTE — Progress Notes (Signed)
PROGRESS NOTE  [name] [name] ZOX:[id number] DOB: 03-22-1966 DOA: 12/29/2012 PCP: Lonia Blood, MD  Brief narrative: 47 year old female admitted recently by [facility] cardiology for NSTEMI STATUS post stent mid L. Circ and CHF re presented to the emergency room 12/29/2012 with abdominal pain-presented with nausea vomiting and had 6 beers and ate Chinese fried rice later in the day and was noted to have nausea vomiting and some diarrhea  Past medical history-As per Problem list Chart reviewed as below- Admission 12/17/2012 for chest pain resulting in cardiac stent placement Admission 07/02/2012 for chest pain rule out Admission 07/06/2011 for chest pain with PCI to the RCA Admission 05/15/2004 for polysubstance abuse our card pancreatitis DKA2 diabetes mellitus history CVA 2000 Multiple admissions 2003 for DKA  Consultants:  none  Procedures:  Chest x-ray portable 1 view = patchy opacities  Antibiotics:  None currently   Subjective  Denies further nausea vomiting still doesn't really want to eat No diarrhea  Tells me she has had gastroparesis in the past    Objective    Interim History: none  Telemetry:    Objective: Filed Vitals:   12/30/12 1408 12/30/12 2117 12/31/12 0507 12/31/12 1400  BP: 140/83 139/80 132/82 129/84  Pulse: 101 103 90 91  Temp: 98.1 F (36.7 C) 99.1 F (37.3 C) 98.4 F (36.9 C) 98.6 F (37 C)  TempSrc: Oral Oral Oral Oral  Resp: 18 18 18 18   SpO2: 98% 99% 96% 97%    Intake/Output Summary (Last 24 hours) at 12/31/12 1515 Last data filed at 12/31/12 1300  Gross per 24 hour  Intake      0 ml  Output    850 ml  Net   -850 ml    Exam:  General: Alert  appearing no apparent distress, Cardiovascular: S1-S2 no murmur rub or gallop Respiratory: Clinically Abdomen: Soft slightly tender in upper epigastric-no rebound no guarding Skin no lower extremity edema rash Neuro grossly  Data Reviewed: Basic Metabolic Panel:  Recent Labs Lab  12/29/12 0714 12/30/12 0527 12/30/12 1200 12/31/12 0415  NA 142 138 141 140  K 3.1* 4.7 3.8 3.6  CL 103 102 102 104  CO2  --  24 25 25   GLUCOSE 261* 447* 304* 220*  BUN 28* 16 16 12   CREATININE 1.10 0.85 0.79 0.71  CALCIUM  --  9.1 9.5 8.9   Liver Function Tests:  Recent Labs Lab 12/30/12 0527 12/30/12 1200 12/31/12 0415  AST 13 14 13   ALT 5 6 5   ALKPHOS 160* 182* 163*  BILITOT 0.5 0.5 0.5  PROT 7.8 8.6* 8.1  ALBUMIN 3.3* 3.6 3.3*    Recent Labs Lab 12/29/12 0700 12/30/12 1200  LIPASE 14 14   No results found for this basename: AMMONIA,  in the last 168 hours CBC:  Recent Labs Lab 12/29/12 0700 12/29/12 0714 12/31/12 0415  WBC 18.2*  --  13.0*  HGB 12.9 14.3 12.5  HCT 37.5 42.0 38.0  MCV 87.2  --  90.3  PLT 599*  --  520*   Cardiac Enzymes: No results found for this basename: CKTOTAL, CKMB, CKMBINDEX, TROPONINI,  in the last 168 hours BNP: No components found with this basename: POCBNP,  CBG:  Recent Labs Lab 12/30/12 2113 12/31/12 0042 12/31/12 0505 12/31/12 0723 12/31/12 1114  GLUCAP 228* 198* 215* 254* 282*    No results found for this or any previous visit (from the past 240 hour(s)).   Studies:  All Imaging reviewed and is as per above notation   Scheduled Meds: . clopidogrel  75 mg Oral Daily  . heparin  5,000 Units Subcutaneous Q8H  . insulin aspart  0-9 Units Subcutaneous Q4H  . insulin glargine  14 Units Subcutaneous QHS  . [START ON 01/01/2013] methotrexate  15 mg Oral Weekly  . metoCLOPramide  5 mg Oral TID AC & HS  . metoprolol tartrate  25 mg Oral BID  . pantoprazole (PROTONIX) IV  40 mg Intravenous Q24H  . sertraline  100 mg Oral Daily  . sodium chloride  3 mL Intravenous Q12H   Continuous Infusions: . sodium chloride 125 mL/hr at 12/31/12 1355     Assessment/Plan: 1. Abdominal pain-gastroenteritis versus gastroparesis-suspect the former rather than bladder she also is burning in her chest and reflux  symptomatology. We will keep her on clear liquids today and monitor.   The patient is also Plavix and this may cause gastric irritation. Continue Phenergan 25 IV every 6 when necessary, IV morphine 1 mg every 4 when necessary, oxycodone every 4 when necessary moderate pain. Minimize nonessential medications-if worsening of symptomatology, will image with CT abdomen pelvis however hold for now as I think she slowly improved 2. History of recent end STEMI with stent placement-continue Plavix and 5 daily and metoprolol 25 twice a day 3. Acute kidney injury-baseline creatinine is 13/0.77 2012, on admission was 28/1.1-creatinine is trended down with IV fluids-changed rate 6/03 to 50 cc an hour to poor by mouth intake 4. Elevated alkaline phosphatase--patient on methotrexate unclear if this is the cause. If persisting, we'll need to get imaging of the abdomen potentially CT with contrast. Suspect less likely secondary to cholelithiasis, pancreatitis etc as lipase was 14 on admission yesterday, LFTs otherwise are normal 5. Diabetes mellitus type 2-poorly controlled.  The sugar is 200 300-early this morning was 440.  She is not eating well will continue current regimen of Lantus 14 units daily as well as SSI q4hrly-would not aggressively control blood sugar with extra insulin at present until she starts taking by mouth a little better 6. History of rheumatoid arthritis continue methotrexate at current dose 15 mg weekly 7. Depression continue sertraline 100 mg daily  Code Status: Full Family Communication: None at bedside Disposition Plan: Inpatient   Pleas Koch, MD  [facility] Pager [phone] 12/31/2012, 3:15 PM    LOS: 2 days

## 2013-01-01 DIAGNOSIS — E860 Dehydration: Secondary | ICD-10-CM

## 2013-01-01 LAB — URINALYSIS, ROUTINE W REFLEX MICROSCOPIC
Glucose, UA: 500 mg/dL — AB
Protein, ur: >300 mg/dL — AB
pH: 7 (ref 5.0–8.0)

## 2013-01-01 LAB — COMPREHENSIVE METABOLIC PANEL
ALT: 6 U/L (ref 0–35)
Albumin: 3.4 g/dL — ABNORMAL LOW (ref 3.5–5.2)
Alkaline Phosphatase: 163 U/L — ABNORMAL HIGH (ref 39–117)
BUN: 13 mg/dL (ref 6–23)
Chloride: 103 mEq/L (ref 96–112)
Potassium: 3.7 mEq/L (ref 3.5–5.1)
Sodium: 137 mEq/L (ref 135–145)
Total Bilirubin: 0.6 mg/dL (ref 0.3–1.2)

## 2013-01-01 LAB — URINE MICROSCOPIC-ADD ON

## 2013-01-01 LAB — RAPID URINE DRUG SCREEN, HOSP PERFORMED
Amphetamines: NOT DETECTED
Benzodiazepines: NOT DETECTED
Opiates: NOT DETECTED

## 2013-01-01 LAB — GLUCOSE, CAPILLARY
Glucose-Capillary: 199 mg/dL — ABNORMAL HIGH (ref 70–99)
Glucose-Capillary: 235 mg/dL — ABNORMAL HIGH (ref 70–99)

## 2013-01-01 MED ORDER — OXYCODONE HCL 5 MG PO TABS: 5.0000 mg | ORAL_TABLET | ORAL | Status: DC | PRN

## 2013-01-01 MED ORDER — METOCLOPRAMIDE HCL 5 MG PO TABS: 10.0000 mg | ORAL_TABLET | Freq: Three times a day (TID) | ORAL | Status: DC

## 2013-01-01 MED ORDER — ONDANSETRON HCL 4 MG PO TABS: 4.0000 mg | ORAL_TABLET | Freq: Four times a day (QID) | ORAL | Status: DC | PRN

## 2013-01-01 MED ORDER — PANTOPRAZOLE SODIUM 40 MG PO TBEC
40.0000 mg | DELAYED_RELEASE_TABLET | Freq: Every day | ORAL | Status: DC
Start: 2013-01-01 — End: 2013-01-01
  Administered 2013-01-01: 40 mg via ORAL
  Filled 2013-01-01: qty 1

## 2013-01-01 NOTE — Progress Notes (Signed)
DC instructions given at this time.  Pt verbalized understanding.  No s/s of any acute distress at dc.  IV removed.  Tele dc'd.

## 2013-01-01 NOTE — Discharge Summary (Addendum)
Physician Discharge Summary  [name] [name] ZOX:[id number] DOB: February 20, 1966 DOA: 12/29/2012  PCP: Lonia Blood, MD  Admit date: 12/29/2012 Discharge date: 01/16/2013  Time spent: Greater than 30 minutes  Recommendations for Outpatient Follow-up:  -Have advised she followup with her primary care physician in 2 weeks.   Discharge Diagnoses:  Principal Problem:   Nausea and vomiting Active Problems:   IDDM   Dehydration   ARTHRITIS, RHEUMATOID   Gastroenteritis   Diabetic gastroparesis   Discharge Condition: Stable and improved  Filed Weights   01/01/13 0400  Weight: 84.233 kg (185 lb 11.2 oz)    History of present illness:  Patient is a 47 y.o. female with past medical history of CAD status post recent stent to the mid left circumflex artery on 12/19/2012. Patient came in to the hospital because of nausea and vomiting. She said yesterday she drank about 6 beers and she ate Chinese shrimp fried rice, later in the day she started to have nausea and vomiting as well as diarrhea. She has abdominal discomfort with bloating. Patient said she vomits everything she eats. She came to the hospital for further evaluation. She was found to have mild dehydration and prerenal azotemia, admitted to the hospital for further evaluation.   Hospital Course:   Nausea/vomiting/abdominal pain -Has a history of severe gastroparesis and I suspect this is the main etiology. -Have advised her that use of chronic narcotics and Phenergan will actually make intestinal transit slower and thus make her symptoms worse. -Have advised her on appropriate use of Reglan 30 minutes before each meal and at bedtime. We'll also prescribe some Zofran. -She is symptomatically improved and is thus has been deemed stable for discharge home today.  Coronary artery disease - stable this hospitalization. No chest pain. Continue home medications.  Acute renal failure -Resolved. -Presumed secondary to prerenal azotemia from GI  losses.  Transaminitis -Likely related to methotrexate use. -Will defer to primary care physician whether methotrexate need be continued or can be discontinued at this time.  Poorly controlled type 2 diabetes mellitus -Controlled. -Continue home regimen and can adjust as an outpatient as needed.  Procedures:  None   Consultations:  None  Discharge Instructions      Discharge Orders   Future Orders Complete By Expires     Diet - low sodium heart healthy  As directed     Discontinue IV  As directed     Increase activity slowly  As directed         Medication List    STOP taking these medications       furosemide 40 MG tablet  Commonly known as:  LASIX     pantoprazole 40 MG tablet  Commonly known as:  PROTONIX     potassium chloride SA 20 MEQ tablet  Commonly known as:  K-DUR,KLOR-CON      TAKE these medications       atorvastatin 40 MG tablet  Commonly known as:  LIPITOR  Take 40 mg by mouth every morning.     clopidogrel 75 MG tablet  Commonly known as:  PLAVIX  Take 75 mg by mouth daily.     ferrous sulfate 325 (65 FE) MG tablet  Take 325 mg by mouth daily with breakfast.     folic acid 1 MG tablet  Commonly known as:  FOLVITE  Take 1 mg by mouth daily.     insulin glargine 100 UNIT/ML injection  Commonly known as:  LANTUS  Inject 14 Units into the  skin 2 (two) times daily.     metoCLOPramide 5 MG tablet  Commonly known as:  REGLAN  Take 2 tablets (10 mg total) by mouth 4 (four) times daily -  before meals and at bedtime.     metoprolol tartrate 25 MG tablet  Commonly known as:  LOPRESSOR  Take 25 mg by mouth 2 (two) times daily.     nitroGLYCERIN 0.4 MG SL tablet  Commonly known as:  NITROSTAT  Place 0.4 mg under the tongue every 5 (five) minutes as needed for chest pain.     sertraline 100 MG tablet  Commonly known as:  ZOLOFT  Take 100 mg by mouth daily.     traMADol 50 MG tablet  Commonly known as:  ULTRAM  Take 50 mg by mouth  every 6 (six) hours as needed for pain.       Allergies  Allergen Reactions  . Aspirin Other (See Comments)    Stomach bleeds  . Codeine Hives and Itching   Follow-up Information   Follow up with GARBA,LAWAL, MD. Schedule an appointment as soon as possible for a visit in 2 weeks.   Contact information:   [address] [place] [address] [phone]        The results of significant diagnostics from this hospitalization (including imaging, microbiology, ancillary and laboratory) are listed below for reference.    Significant Diagnostic Studies: Dg Chest 2 View  12/19/2012   *RADIOLOGY REPORT*  Clinical Data: Cough and congestion.  CHEST - 2 VIEW  Comparison: 07/02/2012.  Findings: The cardiac silhouette, mediastinal and hilar contours are within normal limits and stable.  There are patchy bilateral infiltrates.  No pleural effusion.  The bony thorax is intact.  IMPRESSION: Patchy bilateral lung infiltrates.   Original Report Authenticated By: Rudie Meyer, M.D.   Dg Chest Portable 1 View  12/29/2012   *RADIOLOGY REPORT*  Clinical Data: Chest pain  PORTABLE CHEST - 1 VIEW  Comparison: 12/19/2012  Findings: Patchy opacities noted on the prior study have essentially resolved, with possible minimal residual lingular opacity / scarring.  No pleural effusion or pneumothorax.  The heart is normal in size.  IMPRESSION: Patchy opacities noted on the prior study have essentially resolved.  Possible minimal residual lingular opacity / scarring.   Original Report Authenticated By: Charline Bills, M.D.    Microbiology: No results found for this or any previous visit (from the past 240 hour(s)).   Labs: Basic Metabolic Panel: No results found for this basename: NA, K, CL, CO2, GLUCOSE, BUN, CREATININE, CALCIUM, MG, PHOS,  in the last 168 hours Liver Function Tests: No results found for this basename: AST, ALT, ALKPHOS, BILITOT, PROT, ALBUMIN,  in the last 168 hours No results  found for this basename: LIPASE, AMYLASE,  in the last 168 hours No results found for this basename: AMMONIA,  in the last 168 hours CBC: No results found for this basename: WBC, NEUTROABS, HGB, HCT, MCV, PLT,  in the last 168 hours Cardiac Enzymes: No results found for this basename: CKTOTAL, CKMB, CKMBINDEX, TROPONINI,  in the last 168 hours BNP: BNP (last 3 results)  Recent Labs  07/02/12 2211 12/19/12 0225 01/04/13 1758  PROBNP 666.2* 5770.0* 492.4*   CBG: No results found for this basename: GLUCAP,  in the last 168 hours     Signed:  Chaya Jan  [facility] Pager: [phone] 01/16/2013, 12:08 PM

## 2013-01-04 ENCOUNTER — Emergency Department (HOSPITAL_COMMUNITY): Payer: Medicaid Other

## 2013-01-04 ENCOUNTER — Encounter (HOSPITAL_COMMUNITY): Payer: Self-pay | Admitting: Nurse Practitioner

## 2013-01-04 ENCOUNTER — Inpatient Hospital Stay (HOSPITAL_COMMUNITY)
Admission: EM | Admit: 2013-01-04 | Discharge: 2013-01-06 | DRG: 302 | Disposition: A | Discharge status: Home / Self Care | Payer: Medicaid Other | Attending: Interventional Cardiology | Admitting: Interventional Cardiology

## 2013-01-04 DIAGNOSIS — F1910 Other psychoactive substance abuse, uncomplicated: Secondary | ICD-10-CM | POA: Diagnosis present

## 2013-01-04 DIAGNOSIS — F319 Bipolar disorder, unspecified: Secondary | ICD-10-CM | POA: Diagnosis present

## 2013-01-04 DIAGNOSIS — I10 Essential (primary) hypertension: Secondary | ICD-10-CM | POA: Diagnosis present

## 2013-01-04 DIAGNOSIS — E11319 Type 2 diabetes mellitus with unspecified diabetic retinopathy without macular edema: Secondary | ICD-10-CM | POA: Diagnosis present

## 2013-01-04 DIAGNOSIS — K859 Acute pancreatitis without necrosis or infection, unspecified: Secondary | ICD-10-CM | POA: Diagnosis present

## 2013-01-04 DIAGNOSIS — K3184 Gastroparesis: Secondary | ICD-10-CM | POA: Diagnosis present

## 2013-01-04 DIAGNOSIS — I2510 Atherosclerotic heart disease of native coronary artery without angina pectoris: Secondary | ICD-10-CM

## 2013-01-04 DIAGNOSIS — F17200 Nicotine dependence, unspecified, uncomplicated: Secondary | ICD-10-CM | POA: Diagnosis present

## 2013-01-04 DIAGNOSIS — R109 Unspecified abdominal pain: Secondary | ICD-10-CM

## 2013-01-04 DIAGNOSIS — E1139 Type 2 diabetes mellitus with other diabetic ophthalmic complication: Secondary | ICD-10-CM | POA: Diagnosis present

## 2013-01-04 DIAGNOSIS — R079 Chest pain, unspecified: Secondary | ICD-10-CM

## 2013-01-04 DIAGNOSIS — E1149 Type 2 diabetes mellitus with other diabetic neurological complication: Secondary | ICD-10-CM | POA: Diagnosis present

## 2013-01-04 DIAGNOSIS — M069 Rheumatoid arthritis, unspecified: Secondary | ICD-10-CM | POA: Diagnosis present

## 2013-01-04 DIAGNOSIS — F411 Generalized anxiety disorder: Secondary | ICD-10-CM | POA: Diagnosis present

## 2013-01-04 DIAGNOSIS — K219 Gastro-esophageal reflux disease without esophagitis: Secondary | ICD-10-CM | POA: Diagnosis present

## 2013-01-04 DIAGNOSIS — R112 Nausea with vomiting, unspecified: Secondary | ICD-10-CM

## 2013-01-04 DIAGNOSIS — I252 Old myocardial infarction: Secondary | ICD-10-CM

## 2013-01-04 DIAGNOSIS — Z886 Allergy status to analgesic agent status: Secondary | ICD-10-CM

## 2013-01-04 DIAGNOSIS — R0789 Other chest pain: Secondary | ICD-10-CM | POA: Diagnosis present

## 2013-01-04 DIAGNOSIS — E785 Hyperlipidemia, unspecified: Secondary | ICD-10-CM | POA: Diagnosis present

## 2013-01-04 DIAGNOSIS — D649 Anemia, unspecified: Secondary | ICD-10-CM | POA: Diagnosis present

## 2013-01-04 DIAGNOSIS — Z8673 Personal history of transient ischemic attack (TIA), and cerebral infarction without residual deficits: Secondary | ICD-10-CM

## 2013-01-04 DIAGNOSIS — Z794 Long term (current) use of insulin: Secondary | ICD-10-CM

## 2013-01-04 DIAGNOSIS — R000 Tachycardia, unspecified: Secondary | ICD-10-CM | POA: Diagnosis present

## 2013-01-04 LAB — CBC
HCT: 40.1 % (ref 36.0–46.0)
HCT: 35.5 % — ABNORMAL LOW (ref 36.0–46.0)
Hemoglobin: 13.4 g/dL (ref 12.0–15.0)
Hemoglobin: 12 g/dL (ref 12.0–15.0)
MCH: 30 pg (ref 26.0–34.0)
MCHC: 33.4 g/dL (ref 30.0–36.0)
MCHC: 33.8 g/dL (ref 30.0–36.0)
MCV: 88.8 fL (ref 78.0–100.0)
Platelets: 411 K/uL — ABNORMAL HIGH (ref 150–400)
RBC: 4 MIL/uL (ref 3.87–5.11)
RDW: 16.5 % — ABNORMAL HIGH (ref 11.5–15.5)
WBC: 13.5 10*3/uL — ABNORMAL HIGH (ref 4.0–10.5)
WBC: 11.4 K/uL — ABNORMAL HIGH (ref 4.0–10.5)

## 2013-01-04 LAB — BASIC METABOLIC PANEL
Chloride: 98 mEq/L (ref 96–112)
GFR calc Af Amer: 74 mL/min — ABNORMAL LOW (ref >90)
GFR calc non Af Amer: 64 mL/min — ABNORMAL LOW (ref >90)
Glucose, Bld: 153 mg/dL — ABNORMAL HIGH (ref 70–99)
Potassium: 4 mEq/L (ref 3.5–5.1)
Sodium: 136 mEq/L (ref 135–145)

## 2013-01-04 LAB — LIPASE, BLOOD: Lipase: 10 U/L — ABNORMAL LOW (ref 11–59)

## 2013-01-04 LAB — POCT I-STAT TROPONIN I: Troponin i, poc: 0.01 ng/mL (ref 0.00–0.08)

## 2013-01-04 LAB — CREATININE, SERUM: Creatinine, Ser: 0.99 mg/dL (ref 0.50–1.10)

## 2013-01-04 LAB — GLUCOSE, CAPILLARY: Glucose-Capillary: 95 mg/dL (ref 70–99)

## 2013-01-04 MED ORDER — ENOXAPARIN SODIUM 40 MG/0.4ML ~~LOC~~ SOLN
40.0000 mg | SUBCUTANEOUS | Status: DC
  Administered 2013-01-04 – 2013-01-05 (×2): 40 mg via SUBCUTANEOUS
  Filled 2013-01-04 (×3): qty 0.4

## 2013-01-04 MED ORDER — CLOPIDOGREL BISULFATE 75 MG PO TABS
75.0000 mg | ORAL_TABLET | Freq: Every day | ORAL | Status: DC
  Administered 2013-01-05 – 2013-01-06 (×2): 75 mg via ORAL
  Filled 2013-01-04 (×2): qty 1

## 2013-01-04 MED ORDER — SERTRALINE HCL 100 MG PO TABS
100.0000 mg | ORAL_TABLET | Freq: Every day | ORAL | Status: DC
  Administered 2013-01-05: 100 mg via ORAL
  Filled 2013-01-04 (×2): qty 1

## 2013-01-04 MED ORDER — TRAMADOL HCL 50 MG PO TABS
50.0000 mg | ORAL_TABLET | Freq: Four times a day (QID) | ORAL | Status: DC | PRN
  Administered 2013-01-05: 50 mg via ORAL
  Filled 2013-01-04: qty 1

## 2013-01-04 MED ORDER — ONDANSETRON HCL 4 MG/2ML IJ SOLN
4.0000 mg | Freq: Four times a day (QID) | INTRAMUSCULAR | Status: DC | PRN
  Administered 2013-01-05: 4 mg via INTRAVENOUS
  Filled 2013-01-04: qty 2

## 2013-01-04 MED ORDER — ONDANSETRON HCL 4 MG/2ML IJ SOLN
INTRAMUSCULAR | Status: AC
  Filled 2013-01-04: qty 2

## 2013-01-04 MED ORDER — ONDANSETRON HCL 4 MG/2ML IJ SOLN
4.0000 mg | Freq: Once | INTRAMUSCULAR | Status: AC
  Administered 2013-01-04: 4 mg via INTRAVENOUS

## 2013-01-04 MED ORDER — NITROGLYCERIN 0.4 MG SL SUBL
0.4000 mg | SUBLINGUAL_TABLET | SUBLINGUAL | Status: DC | PRN
  Administered 2013-01-04: 0.4 mg via SUBLINGUAL
  Filled 2013-01-04: qty 25

## 2013-01-04 MED ORDER — ONDANSETRON HCL 4 MG PO TABS
4.0000 mg | ORAL_TABLET | Freq: Four times a day (QID) | ORAL | Status: DC | PRN
Start: 2013-01-04 — End: 2013-01-06
  Administered 2013-01-05 (×2): 4 mg via ORAL
  Filled 2013-01-04 (×2): qty 1

## 2013-01-04 MED ORDER — METHOTREXATE 2.5 MG PO TABS: 15.0000 mg | ORAL_TABLET | ORAL | Status: DC

## 2013-01-04 MED ORDER — INSULIN GLARGINE 100 UNIT/ML ~~LOC~~ SOLN
14.0000 [IU] | Freq: Two times a day (BID) | SUBCUTANEOUS | Status: DC
  Administered 2013-01-05: 14 [IU] via SUBCUTANEOUS
  Filled 2013-01-04 (×5): qty 0.14

## 2013-01-04 MED ORDER — NITROGLYCERIN 0.4 MG SL SUBL: 0.4000 mg | SUBLINGUAL_TABLET | SUBLINGUAL | Status: DC | PRN

## 2013-01-04 MED ORDER — ACETAMINOPHEN 325 MG PO TABS: 650.0000 mg | ORAL_TABLET | ORAL | Status: DC | PRN

## 2013-01-04 MED ORDER — FOLIC ACID 1 MG PO TABS
1.0000 mg | ORAL_TABLET | Freq: Every day | ORAL | Status: DC
  Administered 2013-01-05: 1 mg via ORAL
  Filled 2013-01-04 (×2): qty 1

## 2013-01-04 MED ORDER — ATORVASTATIN CALCIUM 40 MG PO TABS
40.0000 mg | ORAL_TABLET | Freq: Every day | ORAL | Status: DC
  Administered 2013-01-05: 40 mg via ORAL
  Filled 2013-01-04 (×2): qty 1

## 2013-01-04 MED ORDER — FERROUS SULFATE 325 (65 FE) MG PO TABS
325.0000 mg | ORAL_TABLET | Freq: Every day | ORAL | Status: DC
  Administered 2013-01-05: 325 mg via ORAL
  Filled 2013-01-04 (×2): qty 1

## 2013-01-04 MED ORDER — OXYCODONE HCL 5 MG PO TABS
5.0000 mg | ORAL_TABLET | ORAL | Status: DC | PRN
  Administered 2013-01-05 – 2013-01-06 (×6): 5 mg via ORAL
  Filled 2013-01-04 (×6): qty 1

## 2013-01-04 MED ORDER — MORPHINE SULFATE 4 MG/ML IJ SOLN
4.0000 mg | INTRAMUSCULAR | Status: DC | PRN
  Administered 2013-01-04: 4 mg via INTRAVENOUS
  Filled 2013-01-04: qty 1

## 2013-01-04 MED ORDER — METOCLOPRAMIDE HCL 10 MG PO TABS
10.0000 mg | ORAL_TABLET | Freq: Three times a day (TID) | ORAL | Status: DC
  Administered 2013-01-04 – 2013-01-06 (×6): 10 mg via ORAL
  Filled 2013-01-04 (×10): qty 1

## 2013-01-04 MED ORDER — INSULIN ASPART PROT & ASPART (70-30 MIX) 100 UNIT/ML ~~LOC~~ SUSP: 4.0000 [IU] | Freq: Two times a day (BID) | SUBCUTANEOUS | Status: DC

## 2013-01-04 MED ORDER — METOPROLOL TARTRATE 25 MG PO TABS
25.0000 mg | ORAL_TABLET | Freq: Two times a day (BID) | ORAL | Status: DC
  Administered 2013-01-04 – 2013-01-05 (×3): 25 mg via ORAL
  Filled 2013-01-04 (×5): qty 1

## 2013-01-04 MED ORDER — MORPHINE SULFATE 4 MG/ML IJ SOLN
4.0000 mg | Freq: Once | INTRAMUSCULAR | Status: AC
  Administered 2013-01-04: 4 mg via INTRAVENOUS
  Filled 2013-01-04: qty 1

## 2013-01-04 NOTE — ED Provider Notes (Signed)
History     CSN: [id number]  Arrival date & time 01/04/13  1744   First MD Initiated Contact with Patient 01/04/13 1749      Chief Complaint  Patient presents with  . Chest Pain    (Consider location/radiation/quality/duration/timing/severity/associated sxs/prior treatment) HPI Comments: Patient presents emergency department with chief complaint of chest pain. She was recently discharged following an admission for pancreatitis, with persistent nausea and vomiting. Prior to that she was admitted on 12/19/2012 for chest pain, and had a stent placed by Dr. Eldridge Dace. Patient states that today she began having chest pain. She states that it is associated with shortness of breath and diaphoresis. She is currently taking Plavix. She has an allergy to aspirin. Additionally, she continues to complain of nausea and vomiting which are still persistent. She has no other complaints at this time. She has not taken anything to alleviate her symptoms. She states the chest pain is 10 out of 10, and radiates to the left side of her chest.  The history is provided by the patient. No language interpreter was used.    Past Medical History  Diagnosis Date  . Hypertension   . Sebaceous cyst   . Anemia   . Allergic rhinitis   . Hypertension   . Diabetic retinopathy   . Acute osteomyelitis   . Esophageal ulcer   . Esophageal stenosis   . Gastroparesis   . Renal insufficiency   . GERD (gastroesophageal reflux disease)   . Hyperlipidemia   . Depression   . Anxiety   . Polysubstance abuse   . Esophagitis   . CAD (coronary artery disease) 12/12    s/p DES mid and distal RCA with 50% LAD  . Heart murmur   . Anginal pain   . Pneumonia 2012  . Orthopnoea   . Type II diabetes mellitus   . Inferior MI 01/20/2009    /notes on 12/19/2012, "that's the only one I've had" (12/19/2012)  . Acute myocardial infarction, unspecified site, initial episode of care   . Acute myocardial infarction of other lateral wall,  initial episode of care   . Daily headache   . Migraines   . Stroke 2011    denies residual on 12/19/2012  . Rheumatoid arthritis(714.0)   . Bipolar affective   . History of stomach ulcers     Past Surgical History  Procedure Laterality Date  . Irrigation and debridement sebaceous cyst Right 03/2011    "pointer" (12/19/2012)  . Coronary angioplasty with stent placement  01/20/2009    "2" (12/19/2012)  . Coronary angioplasty with stent placement  2012    "2" (12/19/2012)  . Coronary angioplasty with stent placement  12/19/2012    "2" (12/19/2012)    Family History  Problem Relation Age of Onset  . Breast cancer Mother 82  . Hypertension Father   . Stomach cancer Sister   . Hypertension Sister   . Colon cancer Neg Hx     History  Substance Use Topics  . Smoking status: Current Every Day Smoker -- 0.50 packs/day for 22 years    Types: Cigarettes  . Smokeless tobacco: Never Used  . Alcohol Use: Yes     Comment: 12/31/2012 "1 cooler/wk since mother's day; none since last week; clean 6 years before that"    OB History   Grav Para Term Preterm Abortions TAB SAB Ect Mult Living                  Review of  Systems  All other systems reviewed and are negative.    Allergies  Aspirin and Codeine  Home Medications   Current Outpatient Rx  Name  Route  Sig  Dispense  Refill  . atorvastatin (LIPITOR) 40 MG tablet   Oral   Take 40 mg by mouth every morning.         . clopidogrel (PLAVIX) 75 MG tablet   Oral   Take 75 mg by mouth daily.           . ferrous sulfate 325 (65 FE) MG tablet   Oral   Take 325 mg by mouth daily with breakfast.           . folic acid (FOLVITE) 1 MG tablet   Oral   Take 1 mg by mouth daily.           . insulin aspart protamine-insulin aspart (NOVOLOG 70/30) (70-30) 100 UNIT/ML injection   Subcutaneous   Inject 4-20 Units into the skin 2 (two) times daily. Sliding scale as directed         . insulin glargine (LANTUS) 100 UNIT/ML  injection   Subcutaneous   Inject 14 Units into the skin 2 (two) times daily.          . methotrexate (RHEUMATREX) 2.5 MG tablet   Oral   Take 15 mg by mouth once a week. Wednesday. Caution:Chemotherapy. Protect from light.          . metoCLOPramide (REGLAN) 5 MG tablet   Oral   Take 2 tablets (10 mg total) by mouth 4 (four) times daily -  before meals and at bedtime.   120 tablet   1   . metoprolol tartrate (LOPRESSOR) 25 MG tablet   Oral   Take 25 mg by mouth 2 (two) times daily.           . nitroGLYCERIN (NITROSTAT) 0.4 MG SL tablet   Sublingual   Place 0.4 mg under the tongue every 5 (five) minutes as needed for chest pain.          Marland Kitchen ondansetron (ZOFRAN) 4 MG tablet   Oral   Take 4 mg by mouth every 6 (six) hours as needed for nausea.         Marland Kitchen oxyCODONE (OXY IR/ROXICODONE) 5 MG immediate release tablet   Oral   Take 5 mg by mouth every 4 (four) hours as needed.         . sertraline (ZOLOFT) 100 MG tablet   Oral   Take 100 mg by mouth daily.           . traMADol (ULTRAM) 50 MG tablet   Oral   Take 50 mg by mouth every 6 (six) hours as needed for pain.            BP 128/81  Pulse 116  Temp(Src) 98.7 F (37.1 C) (Oral)  Resp 18  SpO2 98%  LMP 12/19/2012  Physical Exam  Nursing note and vitals reviewed. Constitutional: She is oriented to person, place, and time. She appears well-developed and well-nourished.  HENT:  Head: Normocephalic and atraumatic.  Eyes: Conjunctivae and EOM are normal. Pupils are equal, round, and reactive to light.  Neck: Normal range of motion. Neck supple.  Cardiovascular: Regular rhythm, normal heart sounds and intact distal pulses.  Exam reveals no gallop and no friction rub.   No murmur heard. Tachycardia  Pulmonary/Chest: Effort normal and breath sounds normal. No respiratory distress. She has no wheezes. She  has no rales. She exhibits no tenderness.  Some left-sided chest wall tenderness  Abdominal: Soft.  Bowel sounds are normal. She exhibits no distension and no mass. There is tenderness. There is no rebound and no guarding.  Mid epigastric tenderness  Musculoskeletal: Normal range of motion. She exhibits no edema and no tenderness.  Neurological: She is alert and oriented to person, place, and time.  Skin: Skin is warm and dry.  Psychiatric: She has a normal mood and affect. Her behavior is normal. Judgment and thought content normal.    ED Course  Procedures (including critical care time)  Labs Reviewed  CBC  BASIC METABOLIC PANEL  PRO B NATRIURETIC PEPTIDE  LIPASE, BLOOD   Results for orders placed during the hospital encounter of 01/04/13  CBC      Result Value Range   WBC 13.5 (*) 4.0 - 10.5 K/uL   RBC 4.47  3.87 - 5.11 MIL/uL   Hemoglobin 13.4  12.0 - 15.0 g/dL   HCT 16.1  09.6 - 04.5 %   MCV 89.7  78.0 - 100.0 fL   MCH 30.0  26.0 - 34.0 pg   MCHC 33.4  30.0 - 36.0 g/dL   RDW 40.9 (*) 81.1 - 91.4 %   Platelets 444 (*) 150 - 400 K/uL  BASIC METABOLIC PANEL      Result Value Range   Sodium 136  135 - 145 mEq/L   Potassium 4.0  3.5 - 5.1 mEq/L   Chloride 98  96 - 112 mEq/L   CO2 23  19 - 32 mEq/L   Glucose, Bld 153 (*) 70 - 99 mg/dL   BUN 18  6 - 23 mg/dL   Creatinine, Ser 7.82  0.50 - 1.10 mg/dL   Calcium 9.9  8.4 - 95.6 mg/dL   GFR calc non Af Amer 64 (*) >90 mL/min   GFR calc Af Amer 74 (*) >90 mL/min  PRO B NATRIURETIC PEPTIDE      Result Value Range   Pro B Natriuretic peptide (BNP) 492.4 (*) 0 - 125 pg/mL  LIPASE, BLOOD      Result Value Range   Lipase 10 (*) 11 - 59 U/L  GLUCOSE, CAPILLARY      Result Value Range   Glucose-Capillary 95  70 - 99 mg/dL  POCT I-STAT TROPONIN I      Result Value Range   Troponin i, poc 0.01  0.00 - 0.08 ng/mL   Comment 3            Dg Chest 2 View  01/04/2013   *RADIOLOGY REPORT*  Clinical Data: Chest pain.  Nausea and dizziness.  CHEST - 2 VIEW  Comparison: Chest x-ray 12/29/2012.  Findings: Lung volumes are normal.  No  consolidative airspace disease.  No pleural effusions.  No pneumothorax.  No pulmonary nodule or mass noted.  Pulmonary vasculature and the cardiomediastinal silhouette are within normal limits.  Coronary artery stent in the right coronary artery.  IMPRESSION: 1. No radiographic evidence of acute cardiopulmonary disease. 2.  Right coronary artery stent.   Original Report Authenticated By: Trudie Reed, M.D.   Dg Chest 2 View  12/19/2012   *RADIOLOGY REPORT*  Clinical Data: Cough and congestion.  CHEST - 2 VIEW  Comparison: 07/02/2012.  Findings: The cardiac silhouette, mediastinal and hilar contours are within normal limits and stable.  There are patchy bilateral infiltrates.  No pleural effusion.  The bony thorax is intact.  IMPRESSION: Patchy bilateral lung infiltrates.  Original Report Authenticated By: Rudie Meyer, M.D.   Dg Chest Portable 1 View  12/29/2012   *RADIOLOGY REPORT*  Clinical Data: Chest pain  PORTABLE CHEST - 1 VIEW  Comparison: 12/19/2012  Findings: Patchy opacities noted on the prior study have essentially resolved, with possible minimal residual lingular opacity / scarring.  No pleural effusion or pneumothorax.  The heart is normal in size.  IMPRESSION: Patchy opacities noted on the prior study have essentially resolved.  Possible minimal residual lingular opacity / scarring.   Original Report Authenticated By: Charline Bills, M.D.     ED ECG REPORT  I personally interpreted this EKG   Date: 01/04/2013   Rate: 115  Rhythm: sinus tachycardia  QRS Axis: normal  Intervals: normal  ST/T Wave abnormalities: T wave inversions also ST abnormalities laterally  Conduction Disutrbances:none  Narrative Interpretation:   Old EKG Reviewed: changes noted    1. Chest pain       MDM  Patient with chest pain. Recently had a cardiac cath and stent placed on 5/22. Admitted on 6/1 for pancreatitis. Will check basic labs, pain control, oxygen, and sublingual nitroglycerin.  Reevaluate. Anticipate admission to cardiology.   Discussed the patient with Dr. Hyacinth Meeker.  In light of EKG changes and chest pain, will consult cardiology for admission.      Roxy Horseman, PA-C 01/04/13 2240

## 2013-01-04 NOTE — H&P (Signed)
[name] [name] is an 47 y.o. female.    Primary Cardiologist: Dr. Eldridge Dace  Chief Complaint: Chest pain  HPI: 47 y/o female with a PMH of IDDM and CAD presenting for chest pain evaluation.  She was previously admitted to cardiology for NSTEMI and underwent cardiac cath 12/19/2012 with placement of DES to circumflex artery; she also had a chronic total occlusion of RCA. Patient was started on Plavix and has not been on Aspirin due to a reported Aspirin allergy.  She was recently admitted to medicine for nausea and vomiting thought to be secondary to diabetic gastroparesis.  She complained of chest pain that started at rest today.  Her chest pain is described as a sharp pain, 7/10 in severity located underneath her breast area. The chest pain is associated with shortness of breath, made worse by movement and relieved by rest. It is somewhat reproducible on exam.  It is a bit different from the pain she had in May that resulted in stent placement.  Her pain is relieved by Morphine, but not nitroglycerin.  Her EKG obtained today showed sinus tachycardia with heart rate 115 bpm and T-wave inversion in leads II, III, aVF and V5-V6.  When compared to prior EKG from 12/29/2012, T-wave inversion in V5-V6 are relatively new. First set of cardiac markers are negative. Dr. Eldridge Dace would like for the patient to be admitted to cardiololgy.  Past Medical History  Diagnosis Date  . Hypertension   . Sebaceous cyst   . Anemia   . Allergic rhinitis   . Hypertension   . Diabetic retinopathy   . Acute osteomyelitis   . Esophageal ulcer   . Esophageal stenosis   . Gastroparesis   . Renal insufficiency   . GERD (gastroesophageal reflux disease)   . Hyperlipidemia   . Depression   . Anxiety   . Polysubstance abuse   . Esophagitis   . CAD (coronary artery disease) 12/12    s/p DES mid and distal RCA with 50% LAD  . Heart murmur   . Anginal pain   . Pneumonia 2012  . Orthopnoea   . Type II diabetes mellitus   .  Inferior MI 01/20/2009    /notes on 12/19/2012, "that's the only one I've had" (12/19/2012)  . Acute myocardial infarction, unspecified site, initial episode of care   . Acute myocardial infarction of other lateral wall, initial episode of care   . Daily headache   . Migraines   . Stroke 2011    denies residual on 12/19/2012  . Rheumatoid arthritis(714.0)   . Bipolar affective   . History of stomach ulcers     Past Surgical History  Procedure Laterality Date  . Irrigation and debridement sebaceous cyst Right 03/2011    "pointer" (12/19/2012)  . Coronary angioplasty with stent placement  01/20/2009    "2" (12/19/2012)  . Coronary angioplasty with stent placement  2012    "2" (12/19/2012)  . Coronary angioplasty with stent placement  12/19/2012    "2" (12/19/2012)    Family History  Problem Relation Age of Onset  . Breast cancer Mother 66  . Hypertension Father   . Stomach cancer Sister   . Hypertension Sister   . Colon cancer Neg Hx    Social History:  reports that she has been smoking Cigarettes.  She has a 11 pack-year smoking history. She has never used smokeless tobacco. She reports that  drinks alcohol. She reports that she uses illicit drugs ("Crack" cocaine and Marijuana).  Allergies:  Allergies  Allergen Reactions  . Aspirin Other (See Comments)    Stomach bleeds  . Codeine Hives and Itching     (Not in a hospital admission)  Results for orders placed during the hospital encounter of 01/04/13 (from the past 48 hour(s))  CBC     Status: Abnormal   Collection Time    01/04/13  5:58 PM      Result Value Range   WBC 13.5 (*) 4.0 - 10.5 K/uL   RBC 4.47  3.87 - 5.11 MIL/uL   Hemoglobin 13.4  12.0 - 15.0 g/dL   HCT 84.6  96.2 - 95.2 %   MCV 89.7  78.0 - 100.0 fL   MCH 30.0  26.0 - 34.0 pg   MCHC 33.4  30.0 - 36.0 g/dL   RDW 84.1 (*) 32.4 - 40.1 %   Platelets 444 (*) 150 - 400 K/uL  BASIC METABOLIC PANEL     Status: Abnormal   Collection Time    01/04/13  5:58 PM       Result Value Range   Sodium 136  135 - 145 mEq/L   Potassium 4.0  3.5 - 5.1 mEq/L   Chloride 98  96 - 112 mEq/L   CO2 23  19 - 32 mEq/L   Glucose, Bld 153 (*) 70 - 99 mg/dL   BUN 18  6 - 23 mg/dL   Creatinine, Ser 0.27  0.50 - 1.10 mg/dL   Calcium 9.9  8.4 - 25.3 mg/dL   GFR calc non Af Amer 64 (*) >90 mL/min   GFR calc Af Amer 74 (*) >90 mL/min   Comment:            The eGFR has been calculated     using the CKD EPI equation.     This calculation has not been     validated in all clinical     situations.     eGFR's persistently     <90 mL/min signify     possible Chronic Kidney Disease.  PRO B NATRIURETIC PEPTIDE     Status: Abnormal   Collection Time    01/04/13  5:58 PM      Result Value Range   Pro B Natriuretic peptide (BNP) 492.4 (*) 0 - 125 pg/mL  LIPASE, BLOOD     Status: Abnormal   Collection Time    01/04/13  5:58 PM      Result Value Range   Lipase 10 (*) 11 - 59 U/L  POCT I-STAT TROPONIN I     Status: None   Collection Time    01/04/13  6:21 PM      Result Value Range   Troponin i, poc 0.01  0.00 - 0.08 ng/mL   Comment 3            Comment: Due to the release kinetics of cTnI,     a negative result within the first hours     of the onset of symptoms does not rule out     myocardial infarction with certainty.     If myocardial infarction is still suspected,     repeat the test at appropriate intervals.   Dg Chest 2 View  01/04/2013   *RADIOLOGY REPORT*  Clinical Data: Chest pain.  Nausea and dizziness.  CHEST - 2 VIEW  Comparison: Chest x-ray 12/29/2012.  Findings: Lung volumes are normal.  No consolidative airspace disease.  No pleural effusions.  No pneumothorax.  No pulmonary  nodule or mass noted.  Pulmonary vasculature and the cardiomediastinal silhouette are within normal limits.  Coronary artery stent in the right coronary artery.  IMPRESSION: 1. No radiographic evidence of acute cardiopulmonary disease. 2.  Right coronary artery stent.   Original Report  Authenticated By: Trudie Reed, M.D.    Review of Systems  Constitutional: Negative for fever, chills, weight loss, malaise/fatigue and diaphoresis.  HENT: Negative for hearing loss, ear pain, nosebleeds, congestion, sore throat, neck pain, tinnitus and ear discharge.   Eyes: Negative for blurred vision and double vision.  Respiratory: Positive for cough and shortness of breath. Negative for hemoptysis, sputum production, wheezing and stridor.   Cardiovascular: Positive for chest pain. Negative for palpitations, orthopnea, claudication and leg swelling.  Gastrointestinal: Positive for nausea, vomiting and abdominal pain. Negative for heartburn, diarrhea, constipation and blood in stool.  Genitourinary: Negative for dysuria, urgency, frequency, hematuria and flank pain.  Musculoskeletal: Positive for joint pain. Negative for myalgias and back pain.  Skin: Negative for itching and rash.  Neurological: Negative for dizziness, tingling, tremors, sensory change, speech change, seizures, weakness and headaches.  Psychiatric/Behavioral: Negative for hallucinations.    Blood pressure 109/54, pulse 111, temperature 98.7 F (37.1 C), temperature source Oral, resp. rate 25, last menstrual period 12/19/2012, SpO2 97.00%. Physical Exam  Constitutional: She is oriented to person, place, and time. She appears well-developed and well-nourished. No distress.  HENT:  Head: Normocephalic and atraumatic.  Eyes: EOM are normal. Pupils are equal, round, and reactive to light. Right eye exhibits no discharge. Left eye exhibits no discharge. No scleral icterus.  Neck: Normal range of motion. Neck supple. No JVD present. No tracheal deviation present. No thyromegaly present.  Cardiovascular: Normal rate, regular rhythm and normal heart sounds.  Exam reveals no gallop and no friction rub.   No murmur heard. Respiratory: Effort normal and breath sounds normal. No stridor. No respiratory distress. She has no  wheezes. She has no rales. She exhibits no tenderness.  GI: Soft. Bowel sounds are normal. She exhibits no distension. There is tenderness. There is no rebound and no guarding.  Musculoskeletal: Normal range of motion. She exhibits no edema and no tenderness.  Neurological: She is alert and oriented to person, place, and time.  Skin: She is not diaphoretic.  Psychiatric: She has a normal mood and affect.     Assessment/Plan  1. Chest pain with largely atypical features: patient will be admitted to cardiology to be observed on telemetry per request of Dr. Eldridge Dace. We will obtain serial cardiac markers to rule out MI.  If she rules in for MI, we will start the patient on Heparin drip and will likely send her to the cath lab on Monday.  Since her chest pain is currently reproducible on examination, we will treat her with pain medication.  Currently, she states that only Morphine relieves her pain. She will be seen by her primary cardiologist in the morning.  2. CAD s/p circumflex artery stent 12/19/2012.  Will continue Plavix, Metoprolol and Lipitor. She is not on Aspirin due to Aspirin allergy  3. IDDM. Patient will be restarted on her home insulin. He Metoclopramide will be restarted as well since she has diabetic gastroparesis.  Taiwan Talcott E 01/04/2013, 8:19 PM

## 2013-01-04 NOTE — ED Notes (Signed)
D/c Wednesday, was admitted for n/v, pancreatitis. Reports n/v continues, denies diarrhea, last BM 5 days ago. States today 1130 started having intermittent left side CP/left breast area. Pain worsens with deep breaths, mvmt of LUE & area very tender to palpation.  Pt appears very drowsy & sleepy, states took something for nausea PTA, last took oxycodone for pain at 1200.

## 2013-01-04 NOTE — ED Notes (Signed)
Patient transported to X-ray 

## 2013-01-04 NOTE — ED Notes (Signed)
Pt was admitted here for N/v/d earlier this week and states she continues to have n/v/d since discharge then started having CP today. A&Ox4, resp e/u

## 2013-01-04 NOTE — ED Notes (Signed)
Pt gotten out of vehicle by [place], NT and myself; pt brought to room via wheelchair; myself and [place], NT undressed pt, placed in gown, on monitor, continuous pulse oximetry and blood pressure cuff; EKG and vitals performed; family at bedside

## 2013-01-04 NOTE — ED Notes (Signed)
Cards in to evaluate pt

## 2013-01-04 NOTE — ED Notes (Signed)
Vital signs stable. 

## 2013-01-05 LAB — CBC
MCV: 88.9 fL (ref 78.0–100.0)
Platelets: 399 10*3/uL (ref 150–400)
RBC: 4.14 MIL/uL (ref 3.87–5.11)
RDW: 16.4 % — ABNORMAL HIGH (ref 11.5–15.5)
WBC: 11 10*3/uL — ABNORMAL HIGH (ref 4.0–10.5)

## 2013-01-05 LAB — GLUCOSE, CAPILLARY
Glucose-Capillary: 117 mg/dL — ABNORMAL HIGH (ref 70–99)
Glucose-Capillary: 156 mg/dL — ABNORMAL HIGH (ref 70–99)
Glucose-Capillary: 57 mg/dL — ABNORMAL LOW (ref 70–99)
Glucose-Capillary: 59 mg/dL — ABNORMAL LOW (ref 70–99)
Glucose-Capillary: 206 mg/dL — ABNORMAL HIGH (ref 70–99)

## 2013-01-05 LAB — LIPID PANEL
Cholesterol: 212 mg/dL — ABNORMAL HIGH (ref 0–200)
HDL: 60 mg/dL (ref >39)

## 2013-01-05 LAB — HEMOGLOBIN A1C: Hgb A1c MFr Bld: 6.8 % — ABNORMAL HIGH (ref <5.7)

## 2013-01-05 LAB — TROPONIN I
Troponin I: <0.3 ng/mL (ref <0.30)
Troponin I: <0.3 ng/mL (ref <0.30)

## 2013-01-05 MED ORDER — GUAIFENESIN 100 MG/5ML PO SYRP
200.0000 mg | ORAL_SOLUTION | ORAL | Status: DC | PRN
  Administered 2013-01-05: 200 mg via ORAL
  Filled 2013-01-05 (×2): qty 10

## 2013-01-05 MED ORDER — MAGNESIUM HYDROXIDE 400 MG/5ML PO SUSP
30.0000 mL | Freq: Every day | ORAL | Status: DC | PRN
  Administered 2013-01-05: 30 mL via ORAL
  Filled 2013-01-05: qty 30

## 2013-01-05 MED ORDER — METHOTREXATE 2.5 MG PO TABS: 20.0000 mg | ORAL_TABLET | ORAL | Status: DC

## 2013-01-05 MED ORDER — DEXTROSE 50 % IV SOLN: 25.0000 mL | Freq: Once | INTRAVENOUS | Status: AC | PRN

## 2013-01-05 MED ORDER — INSULIN ASPART 100 UNIT/ML ~~LOC~~ SOLN
4.0000 [IU] | Freq: Three times a day (TID) | SUBCUTANEOUS | Status: DC
  Administered 2013-01-05: 6 [IU] via SUBCUTANEOUS

## 2013-01-05 MED ORDER — FERROUS SULFATE 325 (65 FE) MG PO TABS
325.0000 mg | ORAL_TABLET | Freq: Every day | ORAL | Status: DC
  Administered 2013-01-05: 325 mg via ORAL
  Filled 2013-01-05 (×2): qty 1

## 2013-01-05 MED ORDER — GLUCOSE 40 % PO GEL: 1.0000 | ORAL | Status: DC | PRN

## 2013-01-05 MED ORDER — GLUCOSE 40 % PO GEL
ORAL | Status: AC
  Administered 2013-01-05: 37.5 g via ORAL
  Filled 2013-01-05: qty 1

## 2013-01-05 MED ORDER — DEXTROSE 50 % IV SOLN
INTRAVENOUS | Status: AC
  Administered 2013-01-05: 25 mL via INTRAVENOUS
  Filled 2013-01-05: qty 50

## 2013-01-05 NOTE — Progress Notes (Signed)
SUBJECTIVE:  Chest pain resolved.  She has some nausea this morning.  OBJECTIVE:   Vitals:   Filed Vitals:   01/04/13 2016 01/04/13 2030 01/04/13 2128 01/05/13 0548  BP:  116/62 125/71 128/64  Pulse:  104 96 94  Temp:   98.4 F (36.9 C) 98 F (36.7 C)  TempSrc:   Oral Oral  Resp:  21 20 22   Height:   5' (1.524 m)   Weight:   82.1 kg (181 lb)   SpO2: 97% 97% 98% 100%   I&O's:  No intake or output data in the 24 hours ending 01/05/13 0858 TELEMETRY: Reviewed telemetry pt in normal sinus rhythm:     PHYSICAL EXAM General: Well developed, well nourished, in no acute distress Head:   Normal cephalic and atramatic  Lungs:  Clear bilaterally to auscultation and percussion. Heart:   HRRR S1 S2  Abdomen:  abdomen soft and non-tender, obese Msk:  . Normal strength and tone for age. Extremities:   No edema.   Neuro: Alert and oriented X 3. Psych:  Normal affect, responds appropriately   LABS: Basic Metabolic Panel:  Recent Labs  16/10/96 1758 01/04/13 2231  NA 136  --   K 4.0  --   CL 98  --   CO2 23  --   GLUCOSE 153*  --   BUN 18  --   CREATININE 1.03 0.99  CALCIUM 9.9  --    Liver Function Tests: No results found for this basename: AST, ALT, ALKPHOS, BILITOT, PROT, ALBUMIN,  in the last 72 hours  Recent Labs  01/04/13 1758  LIPASE 10*   CBC:  Recent Labs  01/04/13 1758 01/04/13 2231  WBC 13.5* 11.4*  HGB 13.4 12.0  HCT 40.1 35.5*  MCV 89.7 88.8  PLT 444* 411*   Cardiac Enzymes:  Recent Labs  01/04/13 2224  TROPONINI <0.30   BNP: No components found with this basename: POCBNP,  D-Dimer: No results found for this basename: DDIMER,  in the last 72 hours Hemoglobin A1C: No results found for this basename: HGBA1C,  in the last 72 hours Fasting Lipid Panel: No results found for this basename: CHOL, HDL, LDLCALC, TRIG, CHOLHDL, LDLDIRECT,  in the last 72 hours Thyroid Function Tests: No results found for this basename: TSH, T4TOTAL, FREET3,  T3FREE, THYROIDAB,  in the last 72 hours Anemia Panel: No results found for this basename: VITAMINB12, FOLATE, FERRITIN, TIBC, IRON, RETICCTPCT,  in the last 72 hours Coag Panel:   Lab Results  Component Value Date   INR 0.93 12/19/2012   INR 0.97 07/02/2012   INR 0.90 11/09/2011    RADIOLOGY: Dg Chest 2 View  01/04/2013   *RADIOLOGY REPORT*  Clinical Data: Chest pain.  Nausea and dizziness.  CHEST - 2 VIEW  Comparison: Chest x-ray 12/29/2012.  Findings: Lung volumes are normal.  No consolidative airspace disease.  No pleural effusions.  No pneumothorax.  No pulmonary nodule or mass noted.  Pulmonary vasculature and the cardiomediastinal silhouette are within normal limits.  Coronary artery stent in the right coronary artery.  IMPRESSION: 1. No radiographic evidence of acute cardiopulmonary disease. 2.  Right coronary artery stent.   Original Report Authenticated By: Trudie Reed, M.D.   Dg Chest 2 View  12/19/2012   *RADIOLOGY REPORT*  Clinical Data: Cough and congestion.  CHEST - 2 VIEW  Comparison: 07/02/2012.  Findings: The cardiac silhouette, mediastinal and hilar contours are within normal limits and stable.  There are patchy bilateral infiltrates.  No pleural effusion.  The bony thorax is intact.  IMPRESSION: Patchy bilateral lung infiltrates.   Original Report Authenticated By: Rudie Meyer, M.D.   Dg Chest Portable 1 View  12/29/2012   *RADIOLOGY REPORT*  Clinical Data: Chest pain  PORTABLE CHEST - 1 VIEW  Comparison: 12/19/2012  Findings: Patchy opacities noted on the prior study have essentially resolved, with possible minimal residual lingular opacity / scarring.  No pleural effusion or pneumothorax.  The heart is normal in size.  IMPRESSION: Patchy opacities noted on the prior study have essentially resolved.  Possible minimal residual lingular opacity / scarring.   Original Report Authenticated By: Charline Bills, M.D.      ASSESSMENT: Atypical chest pain  PLAN:  She is thus  far ruled out for MI.  We'll try to get one more troponin.  She is a difficult lab draw.  If the troponin is negative, we'll plan to go home on medical therapy.  She feels that her symptoms were from gas.  This does not feel like the MI she had several weeks ago.  Inferior T wave inversion persists.  Lateral T wave inversion slightly better.  Diabetic gastroparesis: She is on metoclopramide.  This may be some other cause for her upper abdominal and lower chest pain.  The pain was under her left breast.  It has resolved today.  Corky Crafts., MD  01/05/2013  8:58 AM

## 2013-01-05 NOTE — Discharge Summary (Signed)
Patient IDBetrice [name] MRN: [id number] DOB/AGE: 08/18/1965 47 y.o.  Admit date: 01/04/2013 Discharge date: 01/06/2013  Primary Discharge Diagnosis atypical chest pain Secondary Discharge Diagnosis coronary artery disease, diabetic gastroparesis  Significant Diagnostic Studies: None   Consults: None  Hospital Course:  47 year old woman with known coronary artery disease.  She had left sided abdominal and lower chest discomfort yesterday.  She was admitted and ruled out for MI.  She thinks her pain was more related to GI symptoms of gas and gastroparesis.  She was admitted a week ago for these symptoms.  She said the chest discomfort was different than what she had at the time of her heart attack.  She had no arrhythmias.  Her pain was much better.  She did have some vomiting on the day of discharge.  She thinks this was just because she did not like the hospital food.  Her discharge was delayed one day due to persistent nausea.  The next day, after milk of magnesia, she had a bowel movement.  Her stomach was still uncomfortable.  I explained to her that her pain medication slow down her GI tract.  This can cause some abdominal pain.  The laxative may help her.  She stated that she absolutely needs her narcotics to deal with pain.  She felt like she was ready to go home.  She may benefit from a GI evaluation.  In the future, I would plan on cardiac evaluation only if she rules in for MI.  Otherwise, it seems that her gastrointestinal complaints have been more significant.   Discharge Exam: Blood pressure 128/64, pulse 94, temperature 98 F (36.7 C), temperature source Oral, resp. rate 22, height 5' (1.524 m), weight 82.1 kg (181 lb), last menstrual period 12/19/2012, SpO2 100.00%.  [place]/AT RRRS1S2 No wheezing Obese, nontender No edema Labs:   Lab Results  Component Value Date   WBC 11.0* 01/05/2013   HGB 12.5 01/05/2013   HCT 36.8 01/05/2013   MCV 88.9 01/05/2013   PLT 399 01/05/2013    Recent  Labs Lab 01/01/13 0350 01/04/13 1758 01/04/13 2231  NA 137 136  --   K 3.7 4.0  --   CL 103 98  --   CO2 23 23  --   BUN 13 18  --   CREATININE 0.76 1.03 0.99  CALCIUM 9.3 9.9  --   PROT 8.2  --   --   BILITOT 0.6  --   --   ALKPHOS 163*  --   --   ALT 6  --   --   AST 15  --   --   GLUCOSE 201* 153*  --    Lab Results  Component Value Date   CKTOTAL 40 02/09/2011   CKMB 1.2 02/09/2011   TROPONINI <0.30 01/05/2013    Lab Results  Component Value Date   CHOL 212* 01/05/2013   CHOL  Value: 184        ATP III CLASSIFICATION:  <200     mg/dL   Desirable  147-829  mg/dL   Borderline Graziani  >=562    mg/dL   Capito        08/03/863   CHOL 183 08/25/2010   Lab Results  Component Value Date   HDL 60 01/05/2013   HDL 67 09/05/2010   HDL 57 08/25/2010   Lab Results  Component Value Date   LDLCALC 120* 01/05/2013   LDLCALC  Value: 107  Total Cholesterol/HDL:CHD Risk Coronary Heart Disease Risk Table                     Men   Women  1/2 Average Risk   3.4   3.3  Average Risk       5.0   4.4  2 X Average Risk   9.6   7.1  3 X Average Risk  23.4   11.0        Use the calculated Patient Ratio above and the CHD Risk Table to determine the patient's CHD Risk.        ATP III CLASSIFICATION (LDL):  <100     mg/dL   Optimal  914-782  mg/dL   Near or Above                    Optimal  130-159  mg/dL   Borderline  956-213  mg/dL   [place]  >086     mg/dL   Very Dumire* 12/05/8467   LDLCALC 101* 08/25/2010   Lab Results  Component Value Date   TRIG 158* 01/05/2013   TRIG 52 09/05/2010   TRIG 123 08/25/2010   Lab Results  Component Value Date   CHOLHDL 3.5 01/05/2013   CHOLHDL 2.7 09/05/2010   CHOLHDL 3.2 Ratio 08/25/2010   No results found for this basename: LDLDIRECT      Radiology: No acute cardiopulmonary disease by chest x-ray EKG: normal sinus rhythm, inferior ST depressions and T-wave inversions, lateral ST depression  FOLLOW UP PLANS AND APPOINTMENTS    Medication List    TAKE these medications        atorvastatin 40 MG tablet  Commonly known as:  LIPITOR  Take 40 mg by mouth every morning.     clopidogrel 75 MG tablet  Commonly known as:  PLAVIX  Take 75 mg by mouth daily.     ferrous sulfate 325 (65 FE) MG tablet  Take 325 mg by mouth daily with breakfast.     folic acid 1 MG tablet  Commonly known as:  FOLVITE  Take 1 mg by mouth daily.     insulin aspart 100 UNIT/ML injection  Commonly known as:  novoLOG  Inject 4-14 Units into the skin 3 (three) times daily with meals. CBG 100-150; 4 units, 151-200; 6 units, 201-250; 8 units, 251-300; 10 units, 301-350; 12 units, 351-400; 14 units, > 401; call doctor     insulin glargine 100 UNIT/ML injection  Commonly known as:  LANTUS  Inject 14 Units into the skin 2 (two) times daily.     methotrexate 2.5 MG tablet  Commonly known as:  RHEUMATREX  Take 20 mg by mouth. Caution:Chemotherapy. Protect from light. Takes on Wednesdays.     metoCLOPramide 5 MG tablet  Commonly known as:  REGLAN  Take 2 tablets (10 mg total) by mouth 4 (four) times daily -  before meals and at bedtime.     metoprolol tartrate 25 MG tablet  Commonly known as:  LOPRESSOR  Take 25 mg by mouth 2 (two) times daily.     nitroGLYCERIN 0.4 MG SL tablet  Commonly known as:  NITROSTAT  Place 0.4 mg under the tongue every 5 (five) minutes as needed for chest pain.     ondansetron 4 MG tablet  Commonly known as:  ZOFRAN  Take 4 mg by mouth every 6 (six) hours as needed for nausea.     oxyCODONE 5 MG immediate release tablet  Commonly known as:  Oxy IR/ROXICODONE  Take 5 mg by mouth every 4 (four) hours as needed.     sertraline 100 MG tablet  Commonly known as:  ZOLOFT  Take 100 mg by mouth daily.     traMADol 50 MG tablet  Commonly known as:  ULTRAM  Take 50 mg by mouth every 6 (six) hours as needed for pain.           Follow-up Information   Follow up with Corky Crafts., MD. (as scheduled)    Contact information:   [address] [place] [address] [phone]       BRING ALL MEDICATIONS WITH YOU TO FOLLOW UP APPOINTMENTS  Time spent with patient to include physician time: 35 minutes going over the plan of care. SignedCorky Crafts. 01/05/2013, 11:50 AM

## 2013-01-05 NOTE — ED Provider Notes (Signed)
Patient is a 47 year old female with a recent stent placed cardiology who presents with recurrent chest pain and EKG changes.  On my exam the patient is abdomen, clear heart and lung sounds, peripheral edema. EKG changes and no, I agree with the documentation PA Dahlia Client, patient will be admitted to the hospital to further evaluate for cardiac ischemia or other source of patient's symptoms. She is hemodynamically stable in the emergency.   Medical screening examination/treatment/procedure(s) were conducted as a shared visit with non-physician practitioner(s) and myself.  I personally evaluated the patient during the encounter    Vida Roller, MD 01/05/13 2115

## 2013-01-05 NOTE — Progress Notes (Signed)
Hypoglycemic Event  CBG: 67  Treatment: D50 IV 25 mL  Symptoms: None  Follow-up CBG: Time: 2322 CBG Result: 206  Possible Reasons for Event: Inadequate meal intake  Comments/MD notified: Hypoglycemia resolved.  Will continue to monitor CBGs overnight.    [name] [name]  Remember to initiate Hypoglycemia Order Set & complete

## 2013-01-05 NOTE — Progress Notes (Signed)
Hypoglycemic Event  CBG: 57  Treatment: 15 GM carbohydrate snack  Symptoms: None  Follow-up CBG: Time:2132 CBG Result:59  Possible Reasons for Event: Inadequate meal intake  Comments/MD notified: Tonight's Lantus held.  Patient unable to eat snack d/t decreased appetite.    [name] [name]  Remember to initiate Hypoglycemia Order Set & complete

## 2013-01-05 NOTE — Progress Notes (Signed)
Hypoglycemic Event  CBG: 59  Treatment: 15 GM gel  Symptoms: None  Follow-up CBG: Time: 2225 CBG Result: 67  Possible Reasons for Event: Inadequate meal intake  Comments/MD notified: patient sipping on ginger ale, but not eating    Buzzy Han, [place]e Staff  Remember to initiate Hypoglycemia Order Set & complete

## 2013-01-05 NOTE — Progress Notes (Signed)
Patient refusing to allow phlebotomist to draw labs from her arms/hands.  States that she had an order for foot sticks during her last admission, and she prefers for them to use her feet to draw labs.  MD notified and states that he cannot order foot sticks because the patient is diabetic.  Patient continues to refuse lab draw.  Labs rescheduled for 0800.  Will continue to monitor.  Alonza Bogus

## 2013-01-06 LAB — GLUCOSE, CAPILLARY: Glucose-Capillary: 187 mg/dL — ABNORMAL HIGH (ref 70–99)

## 2013-01-16 DIAGNOSIS — E1143 Type 2 diabetes mellitus with diabetic autonomic (poly)neuropathy: Secondary | ICD-10-CM

## 2013-02-01 IMAGING — CR DG CHEST 2V
2 series · 2 of 2 positions shown · non-contrast
Comparison: 02/09/2011

CLINICAL DATA: left-sided chest pain

CHEST - 2 VIEW

[w chest pa]
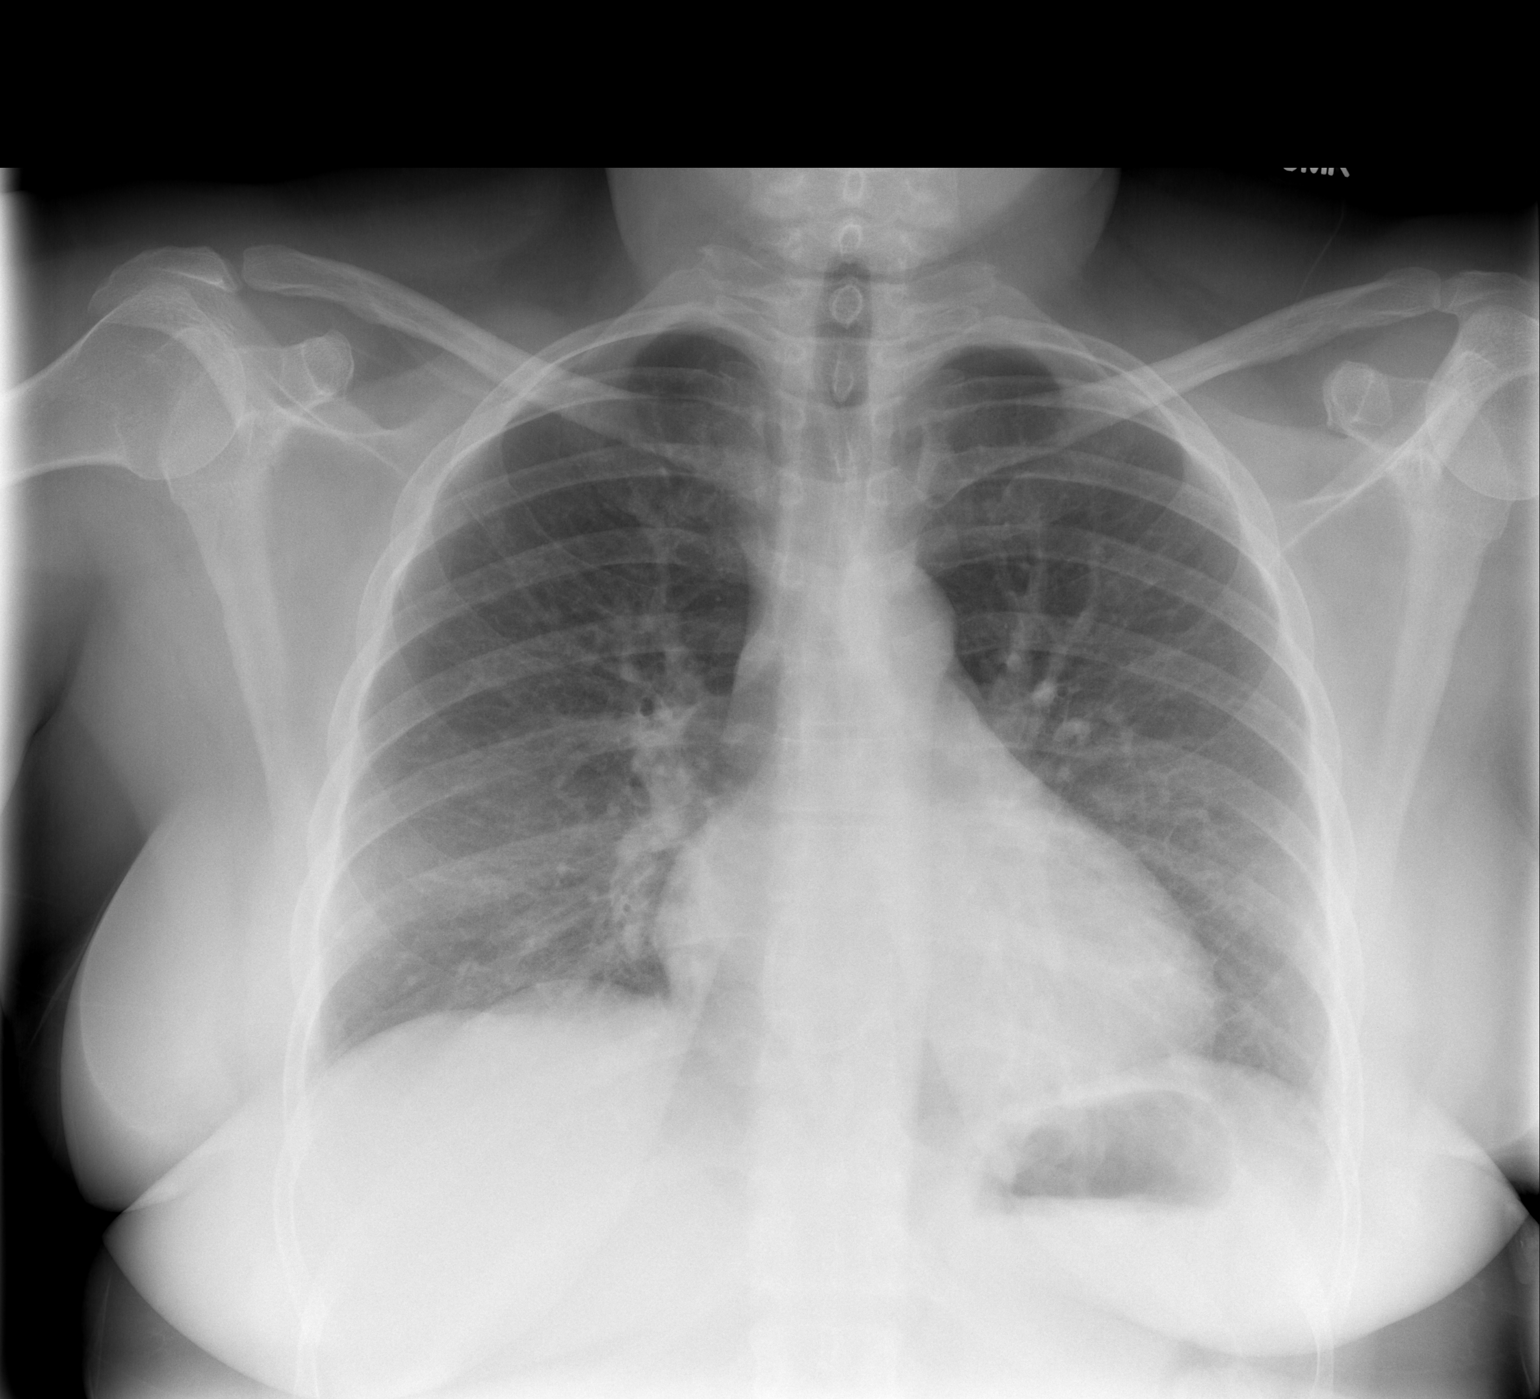

[w chest lat]
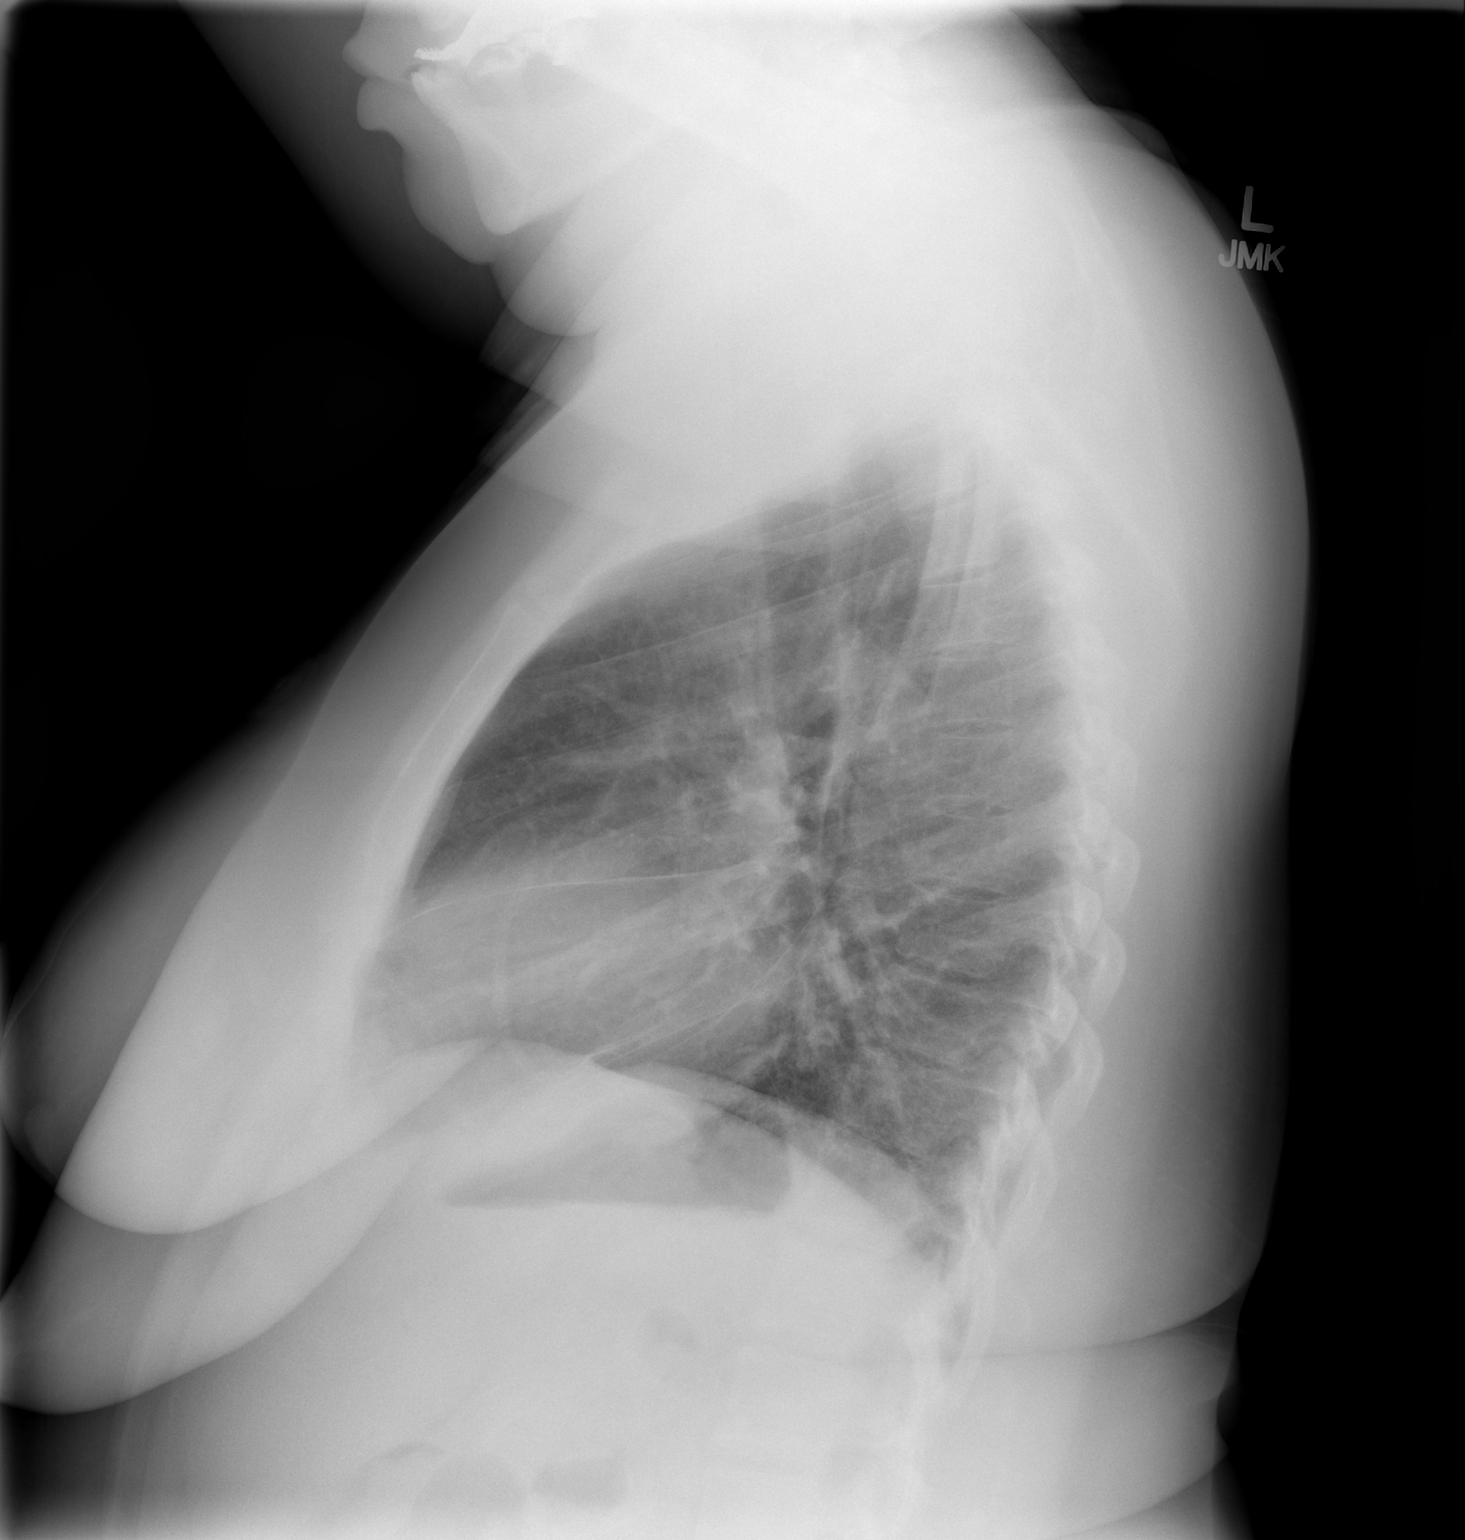

[2 of 2 positions shown; findings below may reference images not displayed]

FINDINGS: Cardiomediastinal silhouette is stable.  No acute infiltrate or
pleural effusion.  No pulmonary edema.  Bony thorax is stable.
IMPRESSION: No active disease.  No significant change.

## 2013-02-05 ENCOUNTER — Other Ambulatory Visit: Payer: Self-pay | Admitting: Gastroenterology

## 2013-02-05 DIAGNOSIS — R112 Nausea with vomiting, unspecified: Secondary | ICD-10-CM

## 2013-02-06 ENCOUNTER — Ambulatory Visit
Admission: RE | Admit: 2013-02-06 | Discharge: 2013-02-06 | Disposition: A | Discharge status: Home / Self Care | Payer: Medicaid Other | Source: Ambulatory Visit | Attending: Gastroenterology | Admitting: Gastroenterology

## 2013-02-06 DIAGNOSIS — R112 Nausea with vomiting, unspecified: Secondary | ICD-10-CM

## 2013-02-20 DIAGNOSIS — Z79899 Other long term (current) drug therapy: Secondary | ICD-10-CM | POA: Insufficient documentation

## 2013-03-06 ENCOUNTER — Encounter (HOSPITAL_COMMUNITY)
Admission: RE | Admit: 2013-03-06 | Discharge: 2013-03-06 | Disposition: A | Discharge status: Home / Self Care | Payer: Medicaid Other | Source: Ambulatory Visit | Attending: Interventional Cardiology | Admitting: Interventional Cardiology

## 2013-03-06 NOTE — Progress Notes (Signed)
Cardiac Rehab Medication Review by a Pharmacist  Does the patient  feel that his/her medications are working for him/her?  yes  Has the patient been experiencing any side effects to the medications prescribed?  yes  Does the patient measure his/her own blood pressure or blood glucose at home?  Yes  Does the patient have any problems obtaining medications due to transportation or finances?   yes  Understanding of regimen: excellent Understanding of indications: excellent Potential of compliance: excellent  Dolphus Jenny 03/06/2013 8:19 AM

## 2013-03-10 ENCOUNTER — Encounter (HOSPITAL_COMMUNITY): Payer: Medicaid Other

## 2013-03-10 ENCOUNTER — Telehealth (HOSPITAL_COMMUNITY): Payer: Self-pay | Admitting: *Deleted

## 2013-03-12 ENCOUNTER — Encounter (HOSPITAL_COMMUNITY): Payer: Medicaid Other

## 2013-03-14 ENCOUNTER — Encounter (HOSPITAL_COMMUNITY): Payer: Medicaid Other

## 2013-03-17 ENCOUNTER — Encounter (HOSPITAL_COMMUNITY): Payer: Medicaid Other

## 2013-03-19 ENCOUNTER — Encounter (HOSPITAL_COMMUNITY): Payer: Medicaid Other

## 2013-03-21 ENCOUNTER — Other Ambulatory Visit (HOSPITAL_COMMUNITY)
Admission: RE | Admit: 2013-03-21 | Discharge: 2013-03-21 | Disposition: A | Discharge status: Home / Self Care | Payer: Medicaid Other | Source: Ambulatory Visit | Attending: Obstetrics and Gynecology | Admitting: Obstetrics and Gynecology

## 2013-03-21 ENCOUNTER — Other Ambulatory Visit: Payer: Self-pay | Admitting: Nurse Practitioner

## 2013-03-21 ENCOUNTER — Encounter (HOSPITAL_COMMUNITY): Payer: Medicaid Other

## 2013-03-21 DIAGNOSIS — Z01419 Encounter for gynecological examination (general) (routine) without abnormal findings: Secondary | ICD-10-CM | POA: Insufficient documentation

## 2013-03-24 ENCOUNTER — Encounter (HOSPITAL_COMMUNITY): Payer: Medicaid Other

## 2013-03-26 ENCOUNTER — Encounter (HOSPITAL_COMMUNITY): Payer: Medicaid Other

## 2013-03-28 ENCOUNTER — Encounter (HOSPITAL_COMMUNITY): Payer: Medicaid Other

## 2013-03-31 ENCOUNTER — Encounter (HOSPITAL_COMMUNITY): Payer: Medicaid Other

## 2013-04-02 ENCOUNTER — Encounter (HOSPITAL_COMMUNITY): Payer: Medicaid Other

## 2013-04-04 ENCOUNTER — Encounter (HOSPITAL_COMMUNITY): Payer: Medicaid Other

## 2013-04-04 ENCOUNTER — Encounter (HOSPITAL_COMMUNITY)
Admission: RE | Admit: 2013-04-04 | Discharge: 2013-04-04 | Disposition: A | Discharge status: Home / Self Care | Payer: Medicaid Other | Source: Ambulatory Visit | Attending: Interventional Cardiology | Admitting: Interventional Cardiology

## 2013-04-04 DIAGNOSIS — I252 Old myocardial infarction: Secondary | ICD-10-CM | POA: Insufficient documentation

## 2013-04-04 DIAGNOSIS — M069 Rheumatoid arthritis, unspecified: Secondary | ICD-10-CM | POA: Insufficient documentation

## 2013-04-04 DIAGNOSIS — E1149 Type 2 diabetes mellitus with other diabetic neurological complication: Secondary | ICD-10-CM | POA: Insufficient documentation

## 2013-04-04 DIAGNOSIS — E1139 Type 2 diabetes mellitus with other diabetic ophthalmic complication: Secondary | ICD-10-CM | POA: Insufficient documentation

## 2013-04-04 DIAGNOSIS — I2510 Atherosclerotic heart disease of native coronary artery without angina pectoris: Secondary | ICD-10-CM | POA: Insufficient documentation

## 2013-04-04 DIAGNOSIS — Z5189 Encounter for other specified aftercare: Secondary | ICD-10-CM | POA: Insufficient documentation

## 2013-04-04 LAB — GLUCOSE, CAPILLARY
Glucose-Capillary: 277 mg/dL — ABNORMAL HIGH (ref 70–99)
Glucose-Capillary: 274 mg/dL — ABNORMAL HIGH (ref 70–99)

## 2013-04-04 NOTE — Progress Notes (Signed)
Pt started cardiac rehab today.  Pt tolerated light exercise without difficulty. Telemetry rhythm Sinus without ectopy. Vital signs stable. Will continue to monitor the patient throughout  the program.  

## 2013-04-07 ENCOUNTER — Encounter (HOSPITAL_COMMUNITY): Payer: Medicaid Other

## 2013-04-07 ENCOUNTER — Encounter (HOSPITAL_COMMUNITY)
Admission: RE | Admit: 2013-04-07 | Discharge: 2013-04-07 | Disposition: A | Discharge status: Home / Self Care | Payer: Medicaid Other | Source: Ambulatory Visit | Attending: Interventional Cardiology | Admitting: Interventional Cardiology

## 2013-04-07 LAB — GLUCOSE, CAPILLARY
Glucose-Capillary: 166 mg/dL — ABNORMAL HIGH (ref 70–99)
Glucose-Capillary: 124 mg/dL — ABNORMAL HIGH (ref 70–99)

## 2013-04-09 ENCOUNTER — Encounter (HOSPITAL_COMMUNITY): Payer: Medicaid Other

## 2013-04-09 ENCOUNTER — Encounter (HOSPITAL_COMMUNITY)
Admission: RE | Admit: 2013-04-09 | Discharge: 2013-04-09 | Disposition: A | Discharge status: Home / Self Care | Payer: Medicaid Other | Source: Ambulatory Visit | Attending: Interventional Cardiology | Admitting: Interventional Cardiology

## 2013-04-09 LAB — GLUCOSE, CAPILLARY: Glucose-Capillary: 220 mg/dL — ABNORMAL HIGH (ref 70–99)

## 2013-04-11 ENCOUNTER — Encounter (HOSPITAL_COMMUNITY): Payer: Medicaid Other

## 2013-04-11 ENCOUNTER — Encounter (HOSPITAL_COMMUNITY)
Admission: RE | Admit: 2013-04-11 | Discharge: 2013-04-11 | Disposition: A | Discharge status: Home / Self Care | Payer: Medicaid Other | Source: Ambulatory Visit | Attending: Interventional Cardiology | Admitting: Interventional Cardiology

## 2013-04-11 NOTE — Progress Notes (Signed)
[name] [name] reported not eating  Lunch today because she was rushing.  CBG 95.  Patient given peanut butter graham crackers and lemonade. Advised that Perlie not exercise today. I told Donelda to make sure she eats before coming to exercise at cardiac rehab.  Nahomi plans to return to exercise on Monday.

## 2013-04-14 ENCOUNTER — Encounter (HOSPITAL_COMMUNITY): Payer: Medicaid Other

## 2013-04-14 ENCOUNTER — Encounter (HOSPITAL_COMMUNITY)
Admission: RE | Admit: 2013-04-14 | Discharge: 2013-04-14 | Disposition: A | Discharge status: Home / Self Care | Payer: Medicaid Other | Source: Ambulatory Visit | Attending: Interventional Cardiology | Admitting: Interventional Cardiology

## 2013-04-14 LAB — GLUCOSE, CAPILLARY
Glucose-Capillary: 133 mg/dL — ABNORMAL HIGH (ref 70–99)
Glucose-Capillary: 162 mg/dL — ABNORMAL HIGH (ref 70–99)

## 2013-04-16 ENCOUNTER — Encounter (HOSPITAL_COMMUNITY)
Admission: RE | Admit: 2013-04-16 | Discharge: 2013-04-16 | Disposition: A | Discharge status: Home / Self Care | Payer: Medicaid Other | Source: Ambulatory Visit | Attending: Interventional Cardiology | Admitting: Interventional Cardiology

## 2013-04-16 ENCOUNTER — Encounter (HOSPITAL_COMMUNITY): Payer: Medicaid Other

## 2013-04-16 LAB — GLUCOSE, CAPILLARY: Glucose-Capillary: 227 mg/dL — ABNORMAL HIGH (ref 70–99)

## 2013-04-16 NOTE — Progress Notes (Signed)
Reviewed home exercise with pt today.  Pt plans to walk at home for exercise.  Reviewed THR, pulse, RPE, sign and symptoms, NTG use, and when to call 911 or MD.  Pt voiced understanding. Kinlee Garrison, MA, ACSM RCEP   

## 2013-04-18 ENCOUNTER — Encounter (HOSPITAL_COMMUNITY): Payer: Medicaid Other

## 2013-04-21 ENCOUNTER — Encounter (HOSPITAL_COMMUNITY): Payer: Medicaid Other

## 2013-04-21 ENCOUNTER — Encounter (HOSPITAL_COMMUNITY)
Admission: RE | Admit: 2013-04-21 | Discharge: 2013-04-21 | Disposition: A | Discharge status: Home / Self Care | Payer: Medicaid Other | Source: Ambulatory Visit | Attending: Interventional Cardiology | Admitting: Interventional Cardiology

## 2013-04-21 LAB — GLUCOSE, CAPILLARY
Glucose-Capillary: 186 mg/dL — ABNORMAL HIGH (ref 70–99)
Glucose-Capillary: 197 mg/dL — ABNORMAL HIGH (ref 70–99)

## 2013-04-21 NOTE — Progress Notes (Signed)
[name] [name] reported having left sided pain that travelled to her left lower quadrant of her abdomen. Areona denied having any chest pain and thought it was gas. Vital signs stable.  Telemetry rhythm Sinus.  Freedom said she felt better after using the bathroom. Will continue to monitor the patient throughout  the program.

## 2013-04-23 ENCOUNTER — Encounter (HOSPITAL_COMMUNITY): Payer: Medicaid Other

## 2013-04-23 ENCOUNTER — Telehealth (HOSPITAL_COMMUNITY): Payer: Self-pay | Admitting: Internal Medicine

## 2013-04-25 ENCOUNTER — Encounter (HOSPITAL_COMMUNITY): Payer: Medicaid Other

## 2013-04-25 ENCOUNTER — Encounter (HOSPITAL_COMMUNITY)
Admission: RE | Admit: 2013-04-25 | Discharge: 2013-04-25 | Disposition: A | Discharge status: Home / Self Care | Payer: Medicaid Other | Source: Ambulatory Visit | Attending: Interventional Cardiology | Admitting: Interventional Cardiology

## 2013-04-25 LAB — GLUCOSE, CAPILLARY
Glucose-Capillary: 82 mg/dL (ref 70–99)
Glucose-Capillary: 90 mg/dL (ref 70–99)

## 2013-04-25 NOTE — Progress Notes (Signed)
Lety post blood sugar was 83.  Patient asymptomatic. Kathrin was given recheck CBG 80 10 minutes  after first post exercise CBG checked. Candyce drank a little ginger ale and said she had to go pick up her grand baby.  Repeat CBG 90 at exit . Will continue to monitor the patient throughout  the program.

## 2013-04-28 ENCOUNTER — Encounter (HOSPITAL_COMMUNITY): Payer: Medicaid Other

## 2013-04-28 ENCOUNTER — Telehealth (HOSPITAL_COMMUNITY): Payer: Self-pay | Admitting: Internal Medicine

## 2013-04-30 ENCOUNTER — Encounter (HOSPITAL_COMMUNITY): Payer: Medicaid Other

## 2013-05-02 ENCOUNTER — Encounter (HOSPITAL_COMMUNITY): Payer: Medicaid Other

## 2013-05-05 ENCOUNTER — Encounter (HOSPITAL_COMMUNITY)
Admission: RE | Admit: 2013-05-05 | Discharge: 2013-05-05 | Disposition: A | Discharge status: Home / Self Care | Payer: Medicaid Other | Source: Ambulatory Visit | Attending: Interventional Cardiology | Admitting: Interventional Cardiology

## 2013-05-05 ENCOUNTER — Encounter (HOSPITAL_COMMUNITY): Payer: Medicaid Other

## 2013-05-05 DIAGNOSIS — E1149 Type 2 diabetes mellitus with other diabetic neurological complication: Secondary | ICD-10-CM | POA: Insufficient documentation

## 2013-05-05 DIAGNOSIS — I2510 Atherosclerotic heart disease of native coronary artery without angina pectoris: Secondary | ICD-10-CM | POA: Insufficient documentation

## 2013-05-05 DIAGNOSIS — I252 Old myocardial infarction: Secondary | ICD-10-CM | POA: Insufficient documentation

## 2013-05-05 DIAGNOSIS — Z5189 Encounter for other specified aftercare: Secondary | ICD-10-CM | POA: Insufficient documentation

## 2013-05-05 DIAGNOSIS — M069 Rheumatoid arthritis, unspecified: Secondary | ICD-10-CM | POA: Insufficient documentation

## 2013-05-05 DIAGNOSIS — E1139 Type 2 diabetes mellitus with other diabetic ophthalmic complication: Secondary | ICD-10-CM | POA: Insufficient documentation

## 2013-05-07 ENCOUNTER — Encounter (HOSPITAL_COMMUNITY): Payer: Medicaid Other

## 2013-05-09 ENCOUNTER — Encounter (HOSPITAL_COMMUNITY): Payer: Medicaid Other

## 2013-05-12 ENCOUNTER — Encounter (HOSPITAL_COMMUNITY): Payer: Medicaid Other

## 2013-05-14 ENCOUNTER — Encounter (HOSPITAL_COMMUNITY): Payer: Medicaid Other

## 2013-05-14 ENCOUNTER — Encounter (HOSPITAL_COMMUNITY)
Admission: RE | Admit: 2013-05-14 | Discharge: 2013-05-14 | Disposition: A | Discharge status: Home / Self Care | Payer: Medicaid Other | Source: Ambulatory Visit | Attending: Interventional Cardiology | Admitting: Interventional Cardiology

## 2013-05-14 LAB — GLUCOSE, CAPILLARY: Glucose-Capillary: 189 mg/dL — ABNORMAL HIGH (ref 70–99)

## 2013-05-14 NOTE — Progress Notes (Signed)
Pt arrived at cardiac rehab reporting she will have laser eye surgery 05/22/13 at Dr. Nadyne Coombes office.  Will fax clearance for pt to return to exercise to [phone].  Pt verbalized understanding of need to verify activity restrictions post procedure

## 2013-05-16 ENCOUNTER — Encounter (HOSPITAL_COMMUNITY): Payer: Medicaid Other

## 2013-05-16 ENCOUNTER — Encounter (HOSPITAL_COMMUNITY)
Admission: RE | Admit: 2013-05-16 | Discharge: 2013-05-16 | Disposition: A | Discharge status: Home / Self Care | Payer: Medicaid Other | Source: Ambulatory Visit | Attending: Interventional Cardiology | Admitting: Interventional Cardiology

## 2013-05-16 LAB — GLUCOSE, CAPILLARY
Glucose-Capillary: 48 mg/dL — ABNORMAL LOW (ref 70–99)
Glucose-Capillary: 51 mg/dL — ABNORMAL LOW (ref 70–99)
Glucose-Capillary: 116 mg/dL — ABNORMAL HIGH (ref 70–99)

## 2013-05-16 NOTE — Progress Notes (Signed)
Pt arrived at cardiac rehab with CBG-48. Pt given 1 tube glucose gel and ginger ale.  15 minute recheck CBG-51. Pt given another tube glucose gel and a banana.  15 recheck CBG-116.   Pt states CBG 118 this am.  Pt ate chicken and rice around 130 and took her usual lantus 16 units this am.  Pt reports she has been controlling serving portions using her "plate" prescribed by  Dr. Sharl Ma.  Pt did not exercise today.

## 2013-05-19 ENCOUNTER — Encounter (HOSPITAL_COMMUNITY)
Admission: RE | Admit: 2013-05-19 | Discharge: 2013-05-19 | Disposition: A | Discharge status: Home / Self Care | Payer: Medicaid Other | Source: Ambulatory Visit | Attending: Interventional Cardiology | Admitting: Interventional Cardiology

## 2013-05-19 ENCOUNTER — Encounter (HOSPITAL_COMMUNITY): Payer: Medicaid Other

## 2013-05-19 LAB — GLUCOSE, CAPILLARY
Glucose-Capillary: 147 mg/dL — ABNORMAL HIGH (ref 70–99)
Glucose-Capillary: 125 mg/dL — ABNORMAL HIGH (ref 70–99)

## 2013-05-19 NOTE — Progress Notes (Signed)
Patient complained of right breat pain. "it feels like I am having my breast squeezed in a mammogram machine". Patient lifted up her breast in the treatment room to show me. Shantia thinks she might of pulled something. I walked with Cintia around the track. Manjot said she felt better when she is moving the pain goes away. Makaley denied having any chest pain. Catharina said she had a mammogram in April of this year. I called the patient's primary care physician Dr Mikeal Hawthorne, Mafalda made an appointment to see Dr Mikeal Hawthorne next Tuesday to evaluate this further. Turquoise did not have any complaints upon exit from cardiac rehab. Valena's blood sugars were stable today.

## 2013-05-21 ENCOUNTER — Encounter (HOSPITAL_COMMUNITY)
Admission: RE | Admit: 2013-05-21 | Discharge: 2013-05-21 | Disposition: A | Discharge status: Home / Self Care | Payer: Medicaid Other | Source: Ambulatory Visit | Attending: Interventional Cardiology | Admitting: Interventional Cardiology

## 2013-05-21 ENCOUNTER — Encounter (HOSPITAL_COMMUNITY): Payer: Medicaid Other

## 2013-05-21 LAB — GLUCOSE, CAPILLARY: Glucose-Capillary: 244 mg/dL — ABNORMAL HIGH (ref 70–99)

## 2013-05-21 MED FILL — Glucose Gel 40%: ORAL | Qty: 2 | Status: AC

## 2013-05-23 ENCOUNTER — Encounter (HOSPITAL_COMMUNITY): Payer: Medicaid Other

## 2013-05-23 ENCOUNTER — Encounter (HOSPITAL_COMMUNITY)
Admission: RE | Admit: 2013-05-23 | Discharge: 2013-05-23 | Disposition: A | Discharge status: Home / Self Care | Payer: Medicaid Other | Source: Ambulatory Visit | Attending: Interventional Cardiology | Admitting: Interventional Cardiology

## 2013-05-26 ENCOUNTER — Telehealth (HOSPITAL_COMMUNITY): Payer: Self-pay | Admitting: Internal Medicine

## 2013-05-26 ENCOUNTER — Encounter (HOSPITAL_COMMUNITY): Payer: Medicaid Other

## 2013-05-28 ENCOUNTER — Encounter (HOSPITAL_COMMUNITY): Payer: Medicaid Other

## 2013-05-28 ENCOUNTER — Telehealth (HOSPITAL_COMMUNITY): Payer: Self-pay | Admitting: Internal Medicine

## 2013-05-30 ENCOUNTER — Encounter (HOSPITAL_COMMUNITY): Payer: Medicaid Other

## 2013-05-30 ENCOUNTER — Telehealth (HOSPITAL_COMMUNITY): Payer: Self-pay | Admitting: Internal Medicine

## 2013-06-02 ENCOUNTER — Encounter (HOSPITAL_COMMUNITY): Payer: Medicaid Other

## 2013-06-04 ENCOUNTER — Encounter (HOSPITAL_COMMUNITY): Payer: Medicaid Other

## 2013-06-04 ENCOUNTER — Encounter (HOSPITAL_COMMUNITY)
Admission: RE | Admit: 2013-06-04 | Discharge: 2013-06-04 | Disposition: A | Discharge status: Home / Self Care | Payer: Medicaid Other | Source: Ambulatory Visit | Attending: Interventional Cardiology | Admitting: Interventional Cardiology

## 2013-06-04 DIAGNOSIS — M069 Rheumatoid arthritis, unspecified: Secondary | ICD-10-CM | POA: Insufficient documentation

## 2013-06-04 DIAGNOSIS — I2510 Atherosclerotic heart disease of native coronary artery without angina pectoris: Secondary | ICD-10-CM | POA: Insufficient documentation

## 2013-06-04 DIAGNOSIS — E1139 Type 2 diabetes mellitus with other diabetic ophthalmic complication: Secondary | ICD-10-CM | POA: Insufficient documentation

## 2013-06-04 DIAGNOSIS — E1149 Type 2 diabetes mellitus with other diabetic neurological complication: Secondary | ICD-10-CM | POA: Insufficient documentation

## 2013-06-04 DIAGNOSIS — Z5189 Encounter for other specified aftercare: Secondary | ICD-10-CM | POA: Insufficient documentation

## 2013-06-04 DIAGNOSIS — I252 Old myocardial infarction: Secondary | ICD-10-CM | POA: Insufficient documentation

## 2013-06-04 LAB — GLUCOSE, CAPILLARY: Glucose-Capillary: 177 mg/dL — ABNORMAL HIGH (ref 70–99)

## 2013-06-04 NOTE — Progress Notes (Signed)
Reviewed [name] [name]'s quality of life questionnaire. [name] [name] denies feeling depressed. Permission granted by patient to forward questionnaire to Dr Eldridge Dace. [name] [name] has switched to the 1:15 exercise class.

## 2013-06-06 ENCOUNTER — Encounter (HOSPITAL_COMMUNITY): Payer: Medicaid Other

## 2013-06-06 ENCOUNTER — Encounter (HOSPITAL_COMMUNITY)
Admission: RE | Admit: 2013-06-06 | Discharge: 2013-06-06 | Disposition: A | Discharge status: Home / Self Care | Payer: Medicaid Other | Source: Ambulatory Visit | Attending: Interventional Cardiology | Admitting: Interventional Cardiology

## 2013-06-06 LAB — GLUCOSE, CAPILLARY: Glucose-Capillary: 161 mg/dL — ABNORMAL HIGH (ref 70–99)

## 2013-06-09 ENCOUNTER — Encounter (HOSPITAL_COMMUNITY): Payer: Medicaid Other

## 2013-06-11 ENCOUNTER — Encounter (HOSPITAL_COMMUNITY): Payer: Medicaid Other

## 2013-06-13 ENCOUNTER — Encounter (HOSPITAL_COMMUNITY): Payer: Medicaid Other

## 2013-06-13 ENCOUNTER — Telehealth (HOSPITAL_COMMUNITY): Payer: Self-pay | Admitting: Internal Medicine

## 2013-06-16 ENCOUNTER — Encounter (HOSPITAL_COMMUNITY): Payer: Medicaid Other

## 2013-06-16 ENCOUNTER — Encounter (HOSPITAL_COMMUNITY)
Admission: RE | Admit: 2013-06-16 | Payer: Medicaid Other | Source: Ambulatory Visit | Attending: Interventional Cardiology | Admitting: Interventional Cardiology

## 2013-06-16 ENCOUNTER — Telehealth (HOSPITAL_COMMUNITY): Payer: Self-pay | Admitting: Cardiac Rehabilitation

## 2013-06-16 NOTE — Telephone Encounter (Signed)
pc received from pt she was absent from cardiac rehab on Friday due to cold and will be absent today due to doctor appt.  06/20/13 is last day pt eligible to attend program due to insurance guidelines

## 2013-06-18 ENCOUNTER — Encounter (HOSPITAL_COMMUNITY): Payer: Medicaid Other

## 2013-06-18 ENCOUNTER — Encounter (HOSPITAL_COMMUNITY)
Admission: RE | Admit: 2013-06-18 | Payer: Medicaid Other | Source: Ambulatory Visit | Attending: Interventional Cardiology | Admitting: Interventional Cardiology

## 2013-06-18 ENCOUNTER — Telehealth (HOSPITAL_COMMUNITY): Payer: Self-pay | Admitting: *Deleted

## 2013-06-18 NOTE — Telephone Encounter (Signed)
pc to pt to discuss symptoms.  Pt states she is taking alka seltzer cold plus and theraflu for the cold symptoms as instructed by her PCP.  Pt c/o nose bleeds. Pt advised appropriate to use humidifier at night and saline nasal spray to moisten nasal passages. Pt also instructed to notify cardiologist if nose bleeds persist or worsen.  Understanding verbalized

## 2013-06-20 ENCOUNTER — Encounter (HOSPITAL_COMMUNITY): Payer: Medicaid Other

## 2013-06-20 ENCOUNTER — Encounter (HOSPITAL_COMMUNITY)
Admission: RE | Admit: 2013-06-20 | Discharge: 2013-06-20 | Disposition: A | Discharge status: Home / Self Care | Payer: Medicaid Other | Source: Ambulatory Visit | Attending: Interventional Cardiology | Admitting: Interventional Cardiology

## 2013-06-20 ENCOUNTER — Encounter (HOSPITAL_COMMUNITY): Payer: Self-pay

## 2013-06-20 LAB — GLUCOSE, CAPILLARY: Glucose-Capillary: 139 mg/dL — ABNORMAL HIGH (ref 70–99)

## 2013-06-20 NOTE — Progress Notes (Signed)
Pt graduated from cardiac rehab program today.  Medication list reconciled.  PHQ9 score- 0 .  Unfortunately, pt allowed time expired prior to her ability to complete 36 sessions.  Pt had frequent absences due to medical and personal issues.  Pt has not quit smoking and states she has no desire.  Pt reports she attended classes without success.  She states this was because she was not given chantix to help.  Dr. Abe People made aware pt desire to try chantix.  Pt counseled on importance of smoking cessation to lessen her cardiovascular risk, especially since she is diabetic.  Pt does not have definitive exercise plans.

## 2013-07-10 ENCOUNTER — Telehealth: Payer: Self-pay | Admitting: Cardiology

## 2013-07-10 NOTE — Telephone Encounter (Signed)
Lm for pt to r/c

## 2013-07-10 NOTE — Telephone Encounter (Signed)
Message copied by Theda Sers on Thu Jul 10, 2013  8:54 AM ------      Message from: Corky Crafts      Created: Thu Jul 10, 2013 12:49 AM      Regarding: FW: cardiac rehab       Ok to use chantix      ----- Message -----         From: Robyne Peers, RN         Sent: 06/20/2013   5:07 PM           To: Everette Rank, MD      Subject: cardiac rehab                                            Dear Dr. Eldridge Dace,            Pt completed cardiac rehab today. Unfortunately, she has not quit smoking.  She states she has attended classes without success because chantix was not made available to her.  Would you be willing to write prescription of chantix for her,  or would you prefer she request from her PCP?              Thank you,      Deveron Furlong, RN      Cardiac Pulmonary Rehab             ------

## 2013-07-11 ENCOUNTER — Encounter (HOSPITAL_COMMUNITY): Payer: Self-pay | Admitting: Pharmacy Technician

## 2013-07-11 ENCOUNTER — Other Ambulatory Visit: Payer: Self-pay | Admitting: Ophthalmology

## 2013-07-11 NOTE — H&P (Signed)
Patient Record  [name], [name]  Patient Number:  [id number] Date of Birth:  September 29, 1965 Age:  47 years old    Gender:  Female Date of Evaluation:  July 11, 2013 Referred by:  [facility],  Chief Complaint:   47 year old female presents for PRP for PDR,  VH OS. Vitreous hemorrhage noted OU on last visit. She is post PRP OD by Dr. Mitzi Davenport. DM x 28 years. Hx of addiction. History of Present Illness:   DM x 28 years. Hx of Amblyopia OD. PDR OU, early Cataracts OU. Past History:  Allergies:  Codeine, hives rash, Active Medications:   Other Medications:  Omeprazole 40mg , Sertraline HCL 100mg , Novolog Flexpen 100 unit/ml, Lantus Pen needles, Lantus for opticlik moounit,, Reglan 10mg , Pravastatin Sodium 40mg , Tramadol HCL 50mg , Lisinopril 5mg , Plavix 75 mg, Metoprolol Tartrate 25mg , Nitroglycerin 0.5mg ,, Trexall 5mg , Folic Acid 1mg , Accu-Chek aviva kit, Accu-chek aviva strip, accu-chek soft touch lancets misc., Ferrous Sulfate 325mg  tabs. Birth History:  none Past Ocular History:  none Past Medical History:   diabetes  heart disease  rheumatoid arthritis   Roehm blood pressure Delahoussaye cholesterol Past Surgical History:   angioplasty  PRP OD Family History:  no amblyopia, no blindness, no cataracts, no crossed eyes, no diabetic retinopathy, no glaucoma, no macular degeneration, no retinal detachment, + cancer (mother), + diabetes (aunt), no heart disease, no [place] blood pressure, no stroke Social History:   Smoking Status: current every day smoker  Alcohol:  none   Driving status:  not driving Marital status:  married Review of Systems:   Eyes: + decreased vision, + loss of vision  Cardiovascular:  + Madill blood pressure, + Tisdel cholesterol  Musculoskeletal: + rheumatic arthritis  Endocrine: + diabetes  All other systems are negative. Examination:  Visual Acuity:   Distance VA cc:  OD: 20/300  Distance VA [place]:  OD: 20/200    OS: 20/100 IOP:  OD:  20     OS:  16    @ 08:35AM  (Goldmann applanation) Pupils: OU:  Shape, size, direct and consensual reaction normal  Adnexa:  Preauricular LN, lacrimal drainage, lacrimal glands, orbit normal  Eyelids: Eyelids:  normal Conjunctiva: OU:  bulbar, palpebral normal  Cornea: OD:  epithelium, stroma, endothelium, tear film normal OS:  Scar without staining Anterior Chamber:  OU:  depth normal, no cell, no flare, 3+ deep / clear  Iris: OU:  normal, rubeosis absent Dilation:  OU: Tropicamide 1%/ N 2.5% @ 03:12PM  Lens: OU:  1+ nuclear sclerotic cataract  Vitreous:  OU:  Vitreous Hemorrhage OU with sub-hyaloid component  Optic Disc:  OD:  cupping: 0.45 , NVD  OS:  cupping: 0.45 , NVD  Macula:  OD:  normal  OU:  dot and blot heme OU, NVE    Vessels: OU:  PDR  Periphery:  OD:  normal, Hemorrhage inferior to macula OS:  NVE>10A; H and E OU:  cotton wool spots  Orientation to person, place and time:  Normal  Mood and affect:  Normal  Impression:  250.50  Diabetes, type II, 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 She expresses the desire to have the blood removed. Given the heme is non-clearing I concur with the plan to  proceed with surgery.  She indicated understanding our discussion and felt that her questions had been answered to her satisfaction.  She indicates that she desires to proceed with the recommended treatment/care plan.   Medications:   Continue customary medications prescribed by PCP Patient Instructions: Please do not eat anything after mignight the day before surgery. Return to clinic:  Dec 18th, 5:00 PM for post-procedure follow-up, PDR, VH OS, dilate OU,   Schedule:  [place], Vitrectomy, Membrane Peeling,  Endolaser left eye   (electronically signed)  Shade Flood, MD

## 2013-07-12 DIAGNOSIS — F17200 Nicotine dependence, unspecified, uncomplicated: Secondary | ICD-10-CM | POA: Insufficient documentation

## 2013-07-12 DIAGNOSIS — D649 Anemia, unspecified: Secondary | ICD-10-CM | POA: Insufficient documentation

## 2013-07-12 DIAGNOSIS — I252 Old myocardial infarction: Secondary | ICD-10-CM | POA: Insufficient documentation

## 2013-07-12 DIAGNOSIS — F1910 Other psychoactive substance abuse, uncomplicated: Secondary | ICD-10-CM | POA: Insufficient documentation

## 2013-07-12 DIAGNOSIS — M069 Rheumatoid arthritis, unspecified: Secondary | ICD-10-CM | POA: Insufficient documentation

## 2013-07-12 DIAGNOSIS — I10 Essential (primary) hypertension: Secondary | ICD-10-CM | POA: Insufficient documentation

## 2013-07-12 DIAGNOSIS — E785 Hyperlipidemia, unspecified: Secondary | ICD-10-CM | POA: Insufficient documentation

## 2013-07-12 DIAGNOSIS — Z79899 Other long term (current) drug therapy: Secondary | ICD-10-CM | POA: Insufficient documentation

## 2013-07-12 DIAGNOSIS — J309 Allergic rhinitis, unspecified: Secondary | ICD-10-CM | POA: Insufficient documentation

## 2013-07-12 DIAGNOSIS — Z885 Allergy status to narcotic agent status: Secondary | ICD-10-CM | POA: Insufficient documentation

## 2013-07-12 DIAGNOSIS — Z8669 Personal history of other diseases of the nervous system and sense organs: Secondary | ICD-10-CM | POA: Insufficient documentation

## 2013-07-12 DIAGNOSIS — Z888 Allergy status to other drugs, medicaments and biological substances status: Secondary | ICD-10-CM | POA: Insufficient documentation

## 2013-07-12 DIAGNOSIS — Z8673 Personal history of transient ischemic attack (TIA), and cerebral infarction without residual deficits: Secondary | ICD-10-CM | POA: Insufficient documentation

## 2013-07-12 DIAGNOSIS — F411 Generalized anxiety disorder: Secondary | ICD-10-CM | POA: Insufficient documentation

## 2013-07-12 DIAGNOSIS — Z8719 Personal history of other diseases of the digestive system: Secondary | ICD-10-CM | POA: Insufficient documentation

## 2013-07-12 DIAGNOSIS — R011 Cardiac murmur, unspecified: Secondary | ICD-10-CM | POA: Insufficient documentation

## 2013-07-12 DIAGNOSIS — M25559 Pain in unspecified hip: Secondary | ICD-10-CM | POA: Insufficient documentation

## 2013-07-12 DIAGNOSIS — M810 Age-related osteoporosis without current pathological fracture: Secondary | ICD-10-CM | POA: Insufficient documentation

## 2013-07-12 DIAGNOSIS — F319 Bipolar disorder, unspecified: Secondary | ICD-10-CM | POA: Insufficient documentation

## 2013-07-12 DIAGNOSIS — Z8701 Personal history of pneumonia (recurrent): Secondary | ICD-10-CM | POA: Insufficient documentation

## 2013-07-12 DIAGNOSIS — K219 Gastro-esophageal reflux disease without esophagitis: Secondary | ICD-10-CM | POA: Insufficient documentation

## 2013-07-12 DIAGNOSIS — E119 Type 2 diabetes mellitus without complications: Secondary | ICD-10-CM | POA: Insufficient documentation

## 2013-07-12 DIAGNOSIS — N289 Disorder of kidney and ureter, unspecified: Secondary | ICD-10-CM | POA: Insufficient documentation

## 2013-07-12 DIAGNOSIS — M549 Dorsalgia, unspecified: Secondary | ICD-10-CM | POA: Insufficient documentation

## 2013-07-12 DIAGNOSIS — I2510 Atherosclerotic heart disease of native coronary artery without angina pectoris: Secondary | ICD-10-CM | POA: Insufficient documentation

## 2013-07-12 DIAGNOSIS — Z7901 Long term (current) use of anticoagulants: Secondary | ICD-10-CM | POA: Insufficient documentation

## 2013-07-12 DIAGNOSIS — Z794 Long term (current) use of insulin: Secondary | ICD-10-CM | POA: Insufficient documentation

## 2013-07-12 DIAGNOSIS — Z8709 Personal history of other diseases of the respiratory system: Secondary | ICD-10-CM | POA: Insufficient documentation

## 2013-07-12 NOTE — ED Notes (Signed)
She has not been able to move her rt leg since 0600 today.  She has pain in her lt thigh and hip and is crying at triage.  She has a history of arthritis.

## 2013-07-13 ENCOUNTER — Emergency Department (HOSPITAL_COMMUNITY): Payer: Medicaid Other

## 2013-07-13 ENCOUNTER — Emergency Department (HOSPITAL_COMMUNITY)
Admission: EM | Admit: 2013-07-13 | Discharge: 2013-07-13 | Disposition: A | Discharge status: Home / Self Care | Payer: Medicaid Other | Attending: Emergency Medicine | Admitting: Emergency Medicine

## 2013-07-13 ENCOUNTER — Encounter (HOSPITAL_COMMUNITY): Payer: Self-pay | Admitting: Emergency Medicine

## 2013-07-13 DIAGNOSIS — M25552 Pain in left hip: Secondary | ICD-10-CM

## 2013-07-13 LAB — GLUCOSE, CAPILLARY: Glucose-Capillary: 208 mg/dL — ABNORMAL HIGH (ref 70–99)

## 2013-07-13 MED ORDER — HYDROCODONE-ACETAMINOPHEN 5-325 MG PO TABS
1.0000 | ORAL_TABLET | Freq: Once | ORAL | Status: AC
  Administered 2013-07-13: 1 via ORAL
  Filled 2013-07-13: qty 1

## 2013-07-13 NOTE — ED Notes (Signed)
Patient transported to X-ray 

## 2013-07-13 NOTE — ED Notes (Signed)
She now reports that she fell on her leg last week

## 2013-07-13 NOTE — ED Notes (Signed)
Pt asked to have sugar checked. cbg- 208

## 2013-07-13 NOTE — ED Provider Notes (Signed)
CSN: [id number]     Arrival date & time 07/12/13  2347 History   First MD Initiated Contact with Patient 07/13/13 0221     Chief Complaint  Patient presents with  . Leg Pain   (Consider location/radiation/quality/duration/timing/severity/associated sxs/prior Treatment) Patient is a 47 y.o. female presenting with leg pain. The history is provided by the patient and a significant other.  Leg Pain Location:  Hip Time since incident:  1 week Injury: no   Hip location:  L hip Pain details:    Quality:  Aching   Radiates to:  Does not radiate   Severity:  Moderate   Onset quality:  Gradual   Duration:  1 week   Timing:  Constant   Progression:  Waxing and waning Chronicity:  New Dislocation: no   Foreign body present:  No foreign bodies Prior injury to area:  Yes (pt fell 1 week ago and reports L hip pain) Relieved by: Ultram. Associated symptoms: back pain   Associated symptoms comment:  Pt has chronic Arthritis.  Uses a cane to ambulate.  Patient has a history of chronic arthritis and chronic pain. Risk factors comment:  No trauma, no falls, no systemic symptoms reported, no fever.   Past Medical History  Diagnosis Date  . Hypertension   . Sebaceous cyst   . Anemia   . Allergic rhinitis   . Hypertension   . Diabetic retinopathy   . Acute osteomyelitis   . Esophageal ulcer   . Esophageal stenosis   . Gastroparesis   . Renal insufficiency   . GERD (gastroesophageal reflux disease)   . Hyperlipidemia   . Depression   . Anxiety   . Polysubstance abuse   . Esophagitis   . CAD (coronary artery disease) 12/12    s/p DES mid and distal RCA with 50% LAD  . Heart murmur   . Anginal pain   . Pneumonia 2012  . Orthopnoea   . Type II diabetes mellitus   . Inferior MI 01/20/2009    /notes on 12/19/2012, "that's the only one I've had" (12/19/2012)  . Acute myocardial infarction, unspecified site, initial episode of care   . Acute myocardial infarction of other lateral wall,  initial episode of care   . Daily headache   . Migraines   . Stroke 2011    denies residual on 12/19/2012  . Rheumatoid arthritis(714.0)   . Bipolar affective   . History of stomach ulcers    Past Surgical History  Procedure Laterality Date  . Irrigation and debridement sebaceous cyst Right 03/2011    "pointer" (12/19/2012)  . Coronary angioplasty with stent placement  01/20/2009    "2" (12/19/2012)  . Coronary angioplasty with stent placement  2012    "2" (12/19/2012)  . Coronary angioplasty with stent placement  12/19/2012    "2" (12/19/2012)   Family History  Problem Relation Age of Onset  . Breast cancer Mother 54  . Hypertension Father   . Stomach cancer Sister   . Hypertension Sister   . Colon cancer Neg Hx    History  Substance Use Topics  . Smoking status: Current Every Day Smoker -- 0.50 packs/day for 22 years    Types: Cigarettes  . Smokeless tobacco: Never Used  . Alcohol Use: Yes     Comment: 12/31/2012 "1 cooler/wk since mother's day; none since last week; clean 6 years before that"   OB History   Grav Para Term Preterm Abortions TAB SAB Ect Mult Living  Review of Systems  Constitutional: Negative.   HENT: Negative.   Eyes: Negative.   Respiratory: Negative.   Cardiovascular: Negative.   Gastrointestinal: Negative.   Endocrine: Negative.   Genitourinary: Negative.   Musculoskeletal: Positive for back pain.       H/o RA and OA  Neurological: Negative.   Hematological: Negative.   Psychiatric/Behavioral: Negative.   All other systems reviewed and are negative.    Allergies  Aspirin and Codeine  Home Medications   Current Outpatient Rx  Name  Route  Sig  Dispense  Refill  . acetaminophen (TYLENOL) 500 MG tablet   Oral   Take 500 mg by mouth every 6 (six) hours as needed for moderate pain.          Marland Kitchen clopidogrel (PLAVIX) 75 MG tablet   Oral   Take 75 mg by mouth daily.          . ferrous sulfate 325 (65 FE) MG tablet    Oral   Take 325 mg by mouth daily with breakfast.          . furosemide (LASIX) 40 MG tablet   Oral   Take 40 mg by mouth daily.         . insulin aspart (NOVOLOG) 100 UNIT/ML injection   Subcutaneous   Inject 4-16 Units into the skin 3 (three) times daily with meals. Sliding scale CBG 100-150; 4 units, 151-200; 6 units, 201-250; 8 units, 251-300; 10 units, 301-350; 12 units, 351-400; 14 units, > 401; call doctor         . insulin glargine (LANTUS) 100 UNIT/ML injection   Subcutaneous   Inject 12 Units into the skin 2 (two) times daily.          . methotrexate (RHEUMATREX) 2.5 MG tablet   Oral   Take 20 mg by mouth every Wednesday. Caution:Chemotherapy. Protect from light. Takes on Wednesdays.         . metoCLOPramide (REGLAN) 5 MG tablet   Oral   Take 2 tablets (10 mg total) by mouth 4 (four) times daily -  before meals and at bedtime.   120 tablet   1   . metoprolol (LOPRESSOR) 50 MG tablet   Oral   Take 50 mg by mouth 2 (two) times daily.         . nitroGLYCERIN (NITROSTAT) 0.4 MG SL tablet   Sublingual   Place 0.4 mg under the tongue every 5 (five) minutes as needed for chest pain.          Marland Kitchen ondansetron (ZOFRAN) 4 MG tablet   Oral   Take 4 mg by mouth every 6 (six) hours as needed for nausea.         . pantoprazole (PROTONIX) 40 MG tablet   Oral   Take 40 mg by mouth 2 (two) times daily.         . sertraline (ZOLOFT) 100 MG tablet   Oral   Take 100 mg by mouth daily.          . sodium-potassium bicarbonate (ALKA-SELTZER GOLD) TBEF dissolvable tablet   Oral   Take 1 tablet by mouth daily as needed.         . traMADol (ULTRAM) 50 MG tablet   Oral   Take 50 mg by mouth every 6 (six) hours as needed for pain.           BP 133/64  Pulse 93  Resp 16  Wt 185 lb (83.915  kg)  SpO2 93%  LMP 05/13/2013 Physical Exam  Vitals reviewed. Constitutional: She appears well-developed and well-nourished.  HENT:  Head: Normocephalic and  atraumatic.  Mouth/Throat: Oropharynx is clear and moist.  Eyes: Conjunctivae are normal. Pupils are equal, round, and reactive to light.  Neck: Normal range of motion. Neck supple.  Cardiovascular: Normal rate and regular rhythm.   Pulmonary/Chest: Effort normal and breath sounds normal.  Abdominal: Bowel sounds are normal. She exhibits no distension and no mass. There is no tenderness. There is no rebound and no guarding.  Musculoskeletal:       Left hip: She exhibits decreased range of motion and tenderness. She exhibits normal strength, no bony tenderness, no swelling, no crepitus, no deformity and no laceration.       Legs: Distal n/v/m function intact. Full active range of motion. No clubbing, cyanosis, or edema, no erythema, no increased warmth.    ED Course  Procedures (including critical care time) Labs Review Labs Reviewed  GLUCOSE, CAPILLARY - Abnormal; Notable for the following:    Glucose-Capillary 208 (*)    All other components within normal limits   Imaging Review Dg Hip Complete Left  07/13/2013   CLINICAL DATA:  Status post fall; left hip pain.  EXAM: LEFT HIP - COMPLETE 2+ VIEW  COMPARISON:  Left hip radiographs performed 06/10/2009  FINDINGS: There is no evidence of fracture or dislocation. Both femoral heads are seated normally within their respective acetabula. The proximal left femur appears intact. No significant degenerative change is appreciated. The sacroiliac joints are unremarkable in appearance.  The visualized bowel gas pattern is grossly unremarkable in appearance. Scattered phleboliths are noted within the pelvis.  IMPRESSION: No evidence of fracture or dislocation.   Electronically Signed   By: Roanna Raider M.D.   On: 07/13/2013 03:55    EKG Interpretation   None       MDM   1. Hip pain, acute, left    47 year old female presents emergency department with left hip pain. History and physical and x-ray consistent with arthritic changes. No history  of trauma. No systemic symptoms. Doubt septic joint, fracture, dislocation, intra-abdominal source, pelvic source. I suspect this is arthritic changes causing acute on chronic pain. X-ray negative. Pain medications given in the emergency department. Patient requests prescription of narcotics as an outpatient, however this is chronic pain.  I recommended that she followup with her primary care provider so that he or she may manage this chronic pain. No narcotics were given on discharge. ER precautions were given patient agrees with this plan.    Darlys Gales, MD 07/13/13 [phone]

## 2013-07-14 NOTE — Telephone Encounter (Signed)
lmtrc

## 2013-07-15 ENCOUNTER — Encounter (HOSPITAL_COMMUNITY)
Admission: RE | Admit: 2013-07-15 | Discharge: 2013-07-15 | Disposition: A | Discharge status: Home / Self Care | Payer: Medicaid Other | Source: Ambulatory Visit | Attending: Ophthalmology | Admitting: Ophthalmology

## 2013-07-15 ENCOUNTER — Encounter (HOSPITAL_COMMUNITY): Payer: Self-pay

## 2013-07-15 LAB — BASIC METABOLIC PANEL
CO2: 24 mEq/L (ref 19–32)
Calcium: 9.5 mg/dL (ref 8.4–10.5)
Chloride: 100 mEq/L (ref 96–112)
GFR calc Af Amer: 78 mL/min — ABNORMAL LOW (ref >90)
GFR calc non Af Amer: 67 mL/min — ABNORMAL LOW (ref >90)
Glucose, Bld: 212 mg/dL — ABNORMAL HIGH (ref 70–99)
Potassium: 4.6 mEq/L (ref 3.5–5.1)
Sodium: 134 mEq/L — ABNORMAL LOW (ref 135–145)

## 2013-07-15 LAB — CBC
HCT: 37.3 % (ref 36.0–46.0)
Hemoglobin: 12.6 g/dL (ref 12.0–15.0)
MCH: 32.3 pg (ref 26.0–34.0)
MCHC: 33.8 g/dL (ref 30.0–36.0)
Platelets: 313 10*3/uL (ref 150–400)
RBC: 3.9 MIL/uL (ref 3.87–5.11)
WBC: 10.8 10*3/uL — ABNORMAL HIGH (ref 4.0–10.5)

## 2013-07-15 LAB — HCG, SERUM, QUALITATIVE: Preg, Serum: NEGATIVE

## 2013-07-15 MED ORDER — CYCLOPENTOLATE HCL 1 % OP SOLN
1.0000 [drp] | OPHTHALMIC | Status: AC | PRN
  Administered 2013-07-16 (×3): 1 [drp] via OPHTHALMIC

## 2013-07-15 MED ORDER — PHENYLEPHRINE HCL 2.5 % OP SOLN
1.0000 [drp] | OPHTHALMIC | Status: AC | PRN
  Administered 2013-07-16 (×3): 1 [drp] via OPHTHALMIC

## 2013-07-15 MED ORDER — PREDNISOLONE ACETATE 1 % OP SUSP
1.0000 [drp] | OPHTHALMIC | Status: AC
  Administered 2013-07-16: 1 [drp] via OPHTHALMIC
  Filled 2013-07-15: qty 5

## 2013-07-15 MED ORDER — TETRACAINE HCL 0.5 % OP SOLN
2.0000 [drp] | OPHTHALMIC | Status: AC
  Administered 2013-07-16: 2 [drp] via OPHTHALMIC
  Filled 2013-07-15: qty 2

## 2013-07-15 MED ORDER — GATIFLOXACIN 0.5 % OP SOLN
1.0000 [drp] | OPHTHALMIC | Status: AC | PRN
  Administered 2013-07-16 (×3): 1 [drp] via OPHTHALMIC

## 2013-07-15 NOTE — Progress Notes (Signed)
Anesthesia Chart Review:  Patient is a 47 year old female scheduled for vitrectomy pars plans tomorrow by Dr. Clarisa Kindred. Patient had already left her PAT appointment when I was called by her PAT RN to review her history.  History includes CAD/NSTEMI s/p DES RCA 12/19/12 with acute systolic CHF, smoking, obesity (BMI 36.7), HTN, anemia, GERD, gastroparesis, polysubstance abuse (quit cocaine and marijuana in 2008), depression, anxiety, Bipolar affective disorder, DM2, RA, migraines, CVA ~ '11, CKD. PCP is listed as Dr. Earlie Lou. Cardiologist is Dr. Eldridge Dace. He last saw her during an admission for N/V in 12/2012. He did not recommend further cardiology evaluation unless she ruled in for MI--which she didn't.  Her next appointment is scheduled for 10/03/13. Patient denied chest pain to her PAT RN. Dr. Clarisa Kindred is not having patient stop her dual anti-platelet therapy (ASA is not listed, but PAT RN reported that patient told her that she was on ASA as well).   Cardiac cath of 12/19/12 showed:  1. Normal left main coronary artery. 2. No significant disease in the left anterior descending artery and its branches. 3. 90% lesion in the mid left circumflex artery. This appears to be a ruptured plaque and was successfully stented with a 2.75 x 23 drug-eluting stent, postdilated to 3.3 mm in diameter. 4. Occluded mid right coronary artery, which appears chronic. The entire distal stented segment appears occluded. The distal RCA system has faint collaterals from the left system. 5. Mildly decreased left ventricular systolic function. LVEDP 30 mmHg. Ejection fraction 40-45 %.  Her last echo in Epic is from 2010 and showed normal LV systolic function, EF 55-65%.  EKG on 01/06/13 showed NSR, T wave abnormality consider inferior ischemia, consider old inferior infarct.   CXR on 01/04/13 showed:  1. No radiographic evidence of acute cardiopulmonary disease.  2. Right coronary artery stent.   Preoperative labs noted.  She will get a fasting CBG on arrival.  I reviewed above with anesthesiologist Dr. Katrinka Blazing.  Patient is s/p DES 11/2012, but denies current CV symptoms and will remain her anti-platelet therapy peri-operatively.  If no acute changes, new symptomology then it is anticipated that she can proceed as planned.  Velna Ochs [facility] Short Stay Center/Anesthesiology Phone [phone] 07/15/2013 1:07 PM

## 2013-07-15 NOTE — Pre-Procedure Instructions (Addendum)
[name] [name]  07/15/2013   Your procedure is scheduled on: Wednesday, December 17th.  Report to [facility], Main Entrance / Entrance "A" at 6:30 AM.  Call this number if you have problems the morning of surgery: [phone]   Remember:   Do not eat food or drink liquids after midnight.   Take these medicines the morning of surgery with A SIP OF WATER: Metoprolol, Reglan, Protonix, Zoloft. May take if needed: Pain medication, Nitroglycerin.    Do not wear jewelry, make-up or nail polish.  Do not wear lotions, powders, or perfumes. You may wear deodorant.  Do not shave 48 hours prior to surgery. Men may shave face and neck.  Do not bring valuables to the hospital.  [facility] is not responsible for any belongings or valuables.               Contacts, dentures or bridgework may not be worn into surgery.  Leave suitcase in the car. After surgery it may be brought to your room.  For patients admitted to the hospital, discharge time is determined by your treatment team.               Patients discharged the day of surgery will not be allowed to drive home.  Name and phone number of your driver: -   Special Instructions: Shower with CHG wash (Bactoshield) tonight and again in the am prior to arriving to hospital.  Please read over the following fact sheets that you were given: Pain Booklet, Coughing and Deep Breathing and Surgical Site Infection Prevention

## 2013-07-16 ENCOUNTER — Ambulatory Visit (HOSPITAL_COMMUNITY): Payer: Medicaid Other | Admitting: Anesthesiology

## 2013-07-16 ENCOUNTER — Ambulatory Visit (HOSPITAL_COMMUNITY)
Admission: RE | Admit: 2013-07-16 | Discharge: 2013-07-16 | Disposition: A | Discharge status: Home / Self Care | Payer: Medicaid Other | Source: Ambulatory Visit | Attending: Ophthalmology | Admitting: Ophthalmology

## 2013-07-16 ENCOUNTER — Encounter (HOSPITAL_COMMUNITY): Payer: Medicaid Other | Admitting: Vascular Surgery

## 2013-07-16 ENCOUNTER — Encounter (HOSPITAL_COMMUNITY): Payer: Self-pay | Admitting: Anesthesiology

## 2013-07-16 ENCOUNTER — Encounter (HOSPITAL_COMMUNITY)
Admission: RE | Disposition: A | Discharge status: Home / Self Care | Payer: Self-pay | Source: Ambulatory Visit | Attending: Ophthalmology

## 2013-07-16 DIAGNOSIS — E1139 Type 2 diabetes mellitus with other diabetic ophthalmic complication: Secondary | ICD-10-CM | POA: Insufficient documentation

## 2013-07-16 DIAGNOSIS — H2510 Age-related nuclear cataract, unspecified eye: Secondary | ICD-10-CM | POA: Insufficient documentation

## 2013-07-16 DIAGNOSIS — F17200 Nicotine dependence, unspecified, uncomplicated: Secondary | ICD-10-CM | POA: Insufficient documentation

## 2013-07-16 DIAGNOSIS — M069 Rheumatoid arthritis, unspecified: Secondary | ICD-10-CM | POA: Insufficient documentation

## 2013-07-16 DIAGNOSIS — Z79899 Other long term (current) drug therapy: Secondary | ICD-10-CM | POA: Insufficient documentation

## 2013-07-16 DIAGNOSIS — I10 Essential (primary) hypertension: Secondary | ICD-10-CM | POA: Insufficient documentation

## 2013-07-16 DIAGNOSIS — E11319 Type 2 diabetes mellitus with unspecified diabetic retinopathy without macular edema: Secondary | ICD-10-CM | POA: Insufficient documentation

## 2013-07-16 DIAGNOSIS — E7800 Pure hypercholesterolemia, unspecified: Secondary | ICD-10-CM | POA: Insufficient documentation

## 2013-07-16 DIAGNOSIS — I519 Heart disease, unspecified: Secondary | ICD-10-CM | POA: Insufficient documentation

## 2013-07-16 DIAGNOSIS — Z794 Long term (current) use of insulin: Secondary | ICD-10-CM | POA: Insufficient documentation

## 2013-07-16 DIAGNOSIS — H4310 Vitreous hemorrhage, unspecified eye: Secondary | ICD-10-CM | POA: Insufficient documentation

## 2013-07-16 DIAGNOSIS — Z01812 Encounter for preprocedural laboratory examination: Secondary | ICD-10-CM | POA: Insufficient documentation

## 2013-07-16 HISTORY — PX: PARS PLANA VITRECTOMY: SHX2166

## 2013-07-16 HISTORY — PX: PHOTOCOAGULATION WITH LASER: SHX6027

## 2013-07-16 HISTORY — PX: MEMBRANE PEEL: SHX5967

## 2013-07-16 HISTORY — PX: GAS INSERTION: SHX5336

## 2013-07-16 LAB — GLUCOSE, CAPILLARY
Glucose-Capillary: 182 mg/dL — ABNORMAL HIGH (ref 70–99)
Glucose-Capillary: 242 mg/dL — ABNORMAL HIGH (ref 70–99)

## 2013-07-16 SURGERY — PARS PLANA VITRECTOMY WITH 23 GAUGE
Anesthesia: Monitor Anesthesia Care | Site: Eye | Laterality: Left

## 2013-07-16 MED ORDER — GATIFLOXACIN 0.5 % OP SOLN
OPHTHALMIC | Status: AC
  Administered 2013-07-16: 1 [drp] via OPHTHALMIC
  Filled 2013-07-16: qty 2.5

## 2013-07-16 MED ORDER — TETRACAINE HCL 0.5 % OP SOLN
OPHTHALMIC | Status: DC | PRN
  Administered 2013-07-16: 2 [drp] via OPHTHALMIC

## 2013-07-16 MED ORDER — BACITRACIN-POLYMYXIN B 500-10000 UNIT/GM OP OINT
TOPICAL_OINTMENT | OPHTHALMIC | Status: AC
  Filled 2013-07-16: qty 3.5

## 2013-07-16 MED ORDER — BSS IO SOLN
INTRAOCULAR | Status: DC | PRN
  Administered 2013-07-16: 15 mL via INTRAOCULAR

## 2013-07-16 MED ORDER — HYPROMELLOSE (GONIOSCOPIC) 2.5 % OP SOLN
OPHTHALMIC | Status: DC | PRN
  Administered 2013-07-16: 2 [drp] via OPHTHALMIC

## 2013-07-16 MED ORDER — CEFAZOLIN SUBCONJUNCTIVAL INJECTION 100 MG/0.5 ML
200.0000 mg | INJECTION | SUBCONJUNCTIVAL | Status: AC
  Administered 2013-07-16: 100 mg via SUBCONJUNCTIVAL
  Filled 2013-07-16: qty 1

## 2013-07-16 MED ORDER — PHENYLEPHRINE HCL 2.5 % OP SOLN
OPHTHALMIC | Status: AC
  Administered 2013-07-16: 1 [drp] via OPHTHALMIC
  Filled 2013-07-16: qty 15

## 2013-07-16 MED ORDER — LIDOCAINE HCL 2 % IJ SOLN
INTRAMUSCULAR | Status: AC
  Filled 2013-07-16: qty 20

## 2013-07-16 MED ORDER — DEXAMETHASONE SODIUM PHOSPHATE 10 MG/ML IJ SOLN
INTRAMUSCULAR | Status: AC
  Filled 2013-07-16: qty 1

## 2013-07-16 MED ORDER — BACITRACIN-POLYMYXIN B 500-10000 UNIT/GM OP OINT
TOPICAL_OINTMENT | OPHTHALMIC | Status: DC | PRN
  Administered 2013-07-16: 1 via OPHTHALMIC

## 2013-07-16 MED ORDER — CYCLOPENTOLATE HCL 1 % OP SOLN
OPHTHALMIC | Status: AC
  Administered 2013-07-16: 1 [drp] via OPHTHALMIC
  Filled 2013-07-16: qty 2

## 2013-07-16 MED ORDER — LIDOCAINE HCL (CARDIAC) 20 MG/ML IV SOLN
INTRAVENOUS | Status: DC | PRN
Start: 2013-07-16 — End: 2013-07-16
  Administered 2013-07-16: 40 mg via INTRAVENOUS

## 2013-07-16 MED ORDER — SODIUM CHLORIDE 0.9 % IV SOLN: INTRAVENOUS | Status: DC

## 2013-07-16 MED ORDER — HYDROMORPHONE HCL PF 1 MG/ML IJ SOLN
0.2500 mg | INTRAMUSCULAR | Status: DC | PRN
  Administered 2013-07-16: 0.25 mg via INTRAVENOUS

## 2013-07-16 MED ORDER — SODIUM HYALURONATE 10 MG/ML IO SOLN
INTRAOCULAR | Status: AC
  Filled 2013-07-16: qty 0.85

## 2013-07-16 MED ORDER — DEXAMETHASONE SODIUM PHOSPHATE 10 MG/ML IJ SOLN
INTRAMUSCULAR | Status: DC | PRN
  Administered 2013-07-16: 5 mg

## 2013-07-16 MED ORDER — PROPOFOL 10 MG/ML IV BOLUS
INTRAVENOUS | Status: DC | PRN
  Administered 2013-07-16: 60 mg via INTRAVENOUS

## 2013-07-16 MED ORDER — BSS PLUS IO SOLN
INTRAOCULAR | Status: AC
  Filled 2013-07-16: qty 500

## 2013-07-16 MED ORDER — NA CHONDROIT SULF-NA HYALURON 40-30 MG/ML IO SOLN
INTRAOCULAR | Status: AC
  Filled 2013-07-16: qty 0.5

## 2013-07-16 MED ORDER — BSS IO SOLN
INTRAOCULAR | Status: AC
  Filled 2013-07-16: qty 15

## 2013-07-16 MED ORDER — HYPROMELLOSE (GONIOSCOPIC) 2.5 % OP SOLN
OPHTHALMIC | Status: AC
  Filled 2013-07-16: qty 15

## 2013-07-16 MED ORDER — LIDOCAINE HCL 2 % IJ SOLN
INTRAMUSCULAR | Status: DC | PRN
  Administered 2013-07-16: 09:00:00 via RETROBULBAR

## 2013-07-16 MED ORDER — ACETAZOLAMIDE SODIUM 500 MG IJ SOLR
INTRAMUSCULAR | Status: AC
  Filled 2013-07-16: qty 500

## 2013-07-16 MED ORDER — SODIUM CHLORIDE 0.9 % IV SOLN
INTRAVENOUS | Status: DC | PRN
  Administered 2013-07-16: 08:00:00 via INTRAVENOUS

## 2013-07-16 MED ORDER — HYDROMORPHONE HCL PF 1 MG/ML IJ SOLN
INTRAMUSCULAR | Status: AC
  Filled 2013-07-16: qty 1

## 2013-07-16 MED ORDER — EPINEPHRINE HCL 1 MG/ML IJ SOLN
INTRAMUSCULAR | Status: AC
  Filled 2013-07-16: qty 1

## 2013-07-16 MED ORDER — TRIAMCINOLONE ACETONIDE 40 MG/ML IJ SUSP
INTRAMUSCULAR | Status: AC
  Filled 2013-07-16: qty 5

## 2013-07-16 MED ORDER — CEFAZOLIN SUBCONJUNCTIVAL INJECTION 100 MG/0.5 ML
100.0000 mg | INJECTION | SUBCONJUNCTIVAL | Status: DC
  Filled 2013-07-16: qty 0.5

## 2013-07-16 MED ORDER — ONDANSETRON HCL 4 MG/2ML IJ SOLN: 4.0000 mg | Freq: Once | INTRAMUSCULAR | Status: DC | PRN

## 2013-07-16 MED ORDER — TETRACAINE HCL 0.5 % OP SOLN
OPHTHALMIC | Status: AC
  Filled 2013-07-16: qty 2

## 2013-07-16 MED ORDER — EPINEPHRINE HCL 1 MG/ML IJ SOLN
INTRAOCULAR | Status: DC | PRN
  Administered 2013-07-16: 08:00:00

## 2013-07-16 MED ORDER — BUPIVACAINE HCL (PF) 0.75 % IJ SOLN
INTRAMUSCULAR | Status: AC
  Filled 2013-07-16: qty 10

## 2013-07-16 SURGICAL SUPPLY — 69 items
APPLICATOR COTTON TIP 6IN STRL (MISCELLANEOUS) ×3 IMPLANT
APPLICATOR DR MATTHEWS STRL (MISCELLANEOUS) IMPLANT
BAG MINI COLL DRAIN (WOUND CARE) IMPLANT
BLADE MVR KNIFE 20G (BLADE) IMPLANT
BLADE STAB KNIFE 15DEG (BLADE) IMPLANT
CANNULA DUAL BORE 23G (CANNULA) IMPLANT
CANNULA FLEX TIP 23G (CANNULA) IMPLANT
CORDS BIPOLAR (ELECTRODE) IMPLANT
COVER MAYO STAND STRL (DRAPES) ×3 IMPLANT
DRAPE OPHTHALMIC 77X100 STRL (CUSTOM PROCEDURE TRAY) ×3 IMPLANT
DRAPE POUCH INSTRU U-SHP 10X18 (DRAPES) ×3 IMPLANT
DRAPE PROXIMA HALF (DRAPES) IMPLANT
DRSG TEGADERM 4X4.75 (GAUZE/BANDAGES/DRESSINGS) ×3 IMPLANT
FILTER BLUE MILLIPORE (MISCELLANEOUS) IMPLANT
GAS AUTO FILL CONSTEL (OPHTHALMIC) ×3
GAS AUTO FILL CONSTELLATION (OPHTHALMIC) ×2 IMPLANT
GAS IO SF6 125GM ISPAN CNSTL (MEDICAL GASES) ×2 IMPLANT
GAS OPHTHALMIC (MISCELLANEOUS) IMPLANT
GAS TANK CONSTELL (MEDICAL GASES) ×1
GLOVE BIO SURGEON STRL SZ7.5 (GLOVE) IMPLANT
GLOVE SS BIOGEL STRL SZ 6.5 (GLOVE) ×2 IMPLANT
GLOVE SUPERSENSE BIOGEL SZ 6.5 (GLOVE) ×1
GLOVE SURG SS PI 6.5 STRL IVOR (GLOVE) ×3 IMPLANT
GLOVE SURG SS PI 7.0 STRL IVOR (GLOVE) ×3 IMPLANT
GOWN EXTRA PROTECTION XL (GOWNS) ×3 IMPLANT
GOWN STRL NON-REIN LRG LVL3 (GOWN DISPOSABLE) ×3 IMPLANT
ILLUMINATOR WIDEFIELD DIFF (MISCELLANEOUS) IMPLANT
KIT BASIN OR (CUSTOM PROCEDURE TRAY) ×3 IMPLANT
KIT ROOM TURNOVER OR (KITS) ×3 IMPLANT
MARKER SKIN DUAL TIP RULER LAB (MISCELLANEOUS) IMPLANT
MASK EYE SHIELD (GAUZE/BANDAGES/DRESSINGS) ×3 IMPLANT
NEEDLE 18GX1X1/2 (RX/OR ONLY) (NEEDLE) IMPLANT
NEEDLE 22X1 1/2 (OR ONLY) (NEEDLE) ×3 IMPLANT
NEEDLE 25GX 5/8IN NON SAFETY (NEEDLE) ×6 IMPLANT
NEEDLE FILTER BLUNT 18X 1/2SAF (NEEDLE) ×1
NEEDLE FILTER BLUNT 18X1 1/2 (NEEDLE) ×2 IMPLANT
NEEDLE HYPO 30X.5 LL (NEEDLE) ×9 IMPLANT
NS IRRIG 1000ML POUR BTL (IV SOLUTION) ×3 IMPLANT
PACK VITRECTOMY CUSTOM (CUSTOM PROCEDURE TRAY) ×3 IMPLANT
PACK VITRECTOMY PIC MCHSVP (PACKS) IMPLANT
PAD ARMBOARD 7.5X6 YLW CONV (MISCELLANEOUS) ×6 IMPLANT
PAD EYE OVAL STERILE LF (GAUZE/BANDAGES/DRESSINGS) ×3 IMPLANT
PAK VITRECTOMY PIK  23GA (OPHTHALMIC RELATED) ×3 IMPLANT
PROBE LASER ILLUM FLEX CVD 23G (OPHTHALMIC) ×3 IMPLANT
ROLLS DENTAL (MISCELLANEOUS) IMPLANT
SCRAPER DIAMOND 25GA (OPHTHALMIC RELATED) IMPLANT
SET FLUID INJECTOR (SET/KITS/TRAYS/PACK) IMPLANT
SET VGFI TUBING 8065808002 (SET/KITS/TRAYS/PACK) IMPLANT
SOLUTION ANTI FOG 6CC (MISCELLANEOUS) IMPLANT
SPEAR EYE SURG WECK-CEL (MISCELLANEOUS) ×9 IMPLANT
SUT ETHILON 10 0 CS140 6 (SUTURE) IMPLANT
SUT ETHILON 5 0 P 3 18 (SUTURE)
SUT ETHILON 9 0 TG140 8 (SUTURE) IMPLANT
SUT NYLON ETHILON 5-0 P-3 1X18 (SUTURE) IMPLANT
SUT PLAIN 6 0 TG1408 (SUTURE) IMPLANT
SUT POLY NON ABSORB 10-0 8 STR (SUTURE) IMPLANT
SUT VICRYL 6-0 S14 (SUTURE) IMPLANT
SUT VICRYL 7 0 TG140 8 (SUTURE) IMPLANT
SYR 20CC LL (SYRINGE) IMPLANT
SYR 5ML LL (SYRINGE) IMPLANT
SYR TB 1ML LUER SLIP (SYRINGE) ×3 IMPLANT
SYRINGE 10CC LL (SYRINGE) IMPLANT
TAPE PAPER MEDFIX 1IN X 10YD (GAUZE/BANDAGES/DRESSINGS) ×3 IMPLANT
TOWEL OR 17X24 6PK STRL BLUE (TOWEL DISPOSABLE) ×3 IMPLANT
TOWEL OR 17X26 10 PK STRL BLUE (TOWEL DISPOSABLE) ×3 IMPLANT
TUBE EXTENSION HAMMER (TUBING) IMPLANT
WATER STERILE IRR 1000ML POUR (IV SOLUTION) ×3 IMPLANT
WIPE INSTRUMENT ADHESIVE BACK (MISCELLANEOUS) ×3 IMPLANT
WIPE INSTRUMENT VISIWIPE 73X73 (MISCELLANEOUS) ×3 IMPLANT

## 2013-07-16 NOTE — Interval H&P Note (Signed)
History and Physical Interval Note:  07/16/2013 8:13 AM  [name] [name]  has presented today for surgery, with the diagnosis of Vitreous Hemorrhage, Proliferative Diabetic Retinopathy Left Eye  The various methods of treatment have been discussed with the patient and family. After consideration of risks, benefits and other options for treatment, the patient has consented to  Procedure(s): VITRECTOMY PARS PLANA 23 GAUGE FOR ENDOPHTHALMITIS (Left) as a surgical intervention .  The patient's history has been reviewed, patient examined, no change in status, stable for surgery.  I have reviewed the patient's chart and labs.  Questions were answered to the patient's satisfaction.    BP 121/53  Pulse 85  Temp(Src) 98.7 F (37.1 C) (Oral)  Resp 22  SpO2 99%  LMP 04/30/2013  There are no contraindications fr planned surgery.   Jacara Benito, Waynette Buttery

## 2013-07-16 NOTE — Op Note (Signed)
Procedure: Pars Plana Vitrectomy, Membrane Peeling, Endolaser and Fluid Gas Exchange Diagnosis: Proliferative Diabetic Retinopathy with Vitreous Hemorrhage Operative Eye:  left eye  Surgeon: Shade Flood Estimated Blood Loss: minimal Specimens for Pathology:  None Complications: none   The  patient was prepped and draped in the usual fashion for ocular surgery on the  left eye .  A solid lid speculum was placed. The conjunctiva was displaced with a cotton tipped applicator at the  4:30  meridian. A trocar/cannula was placed 3.5 mm from the surgical limbus. The cannula was visualized in the vitreous cavity. The infusion line was allowed to run and then clamped when placed at the cannula opening. The line was inserted and secured to the drape with an adhesive strip. Trocar/Cannulas were then placed at the 9:30 and 2:30 meridian. The light pipe and vitreous cutter were inserted into the vitreous cavity and the wide field lens was placed.  The patient was noted to have sub-hayloid and vitreous hemorrhage with multiple foci of NVE throughout the posterior pole. Care was taken to elevate and separate the posterior vitreous face from the retina. Some of the fibrovascular attachments peeled away from the retina in that process. Anterior posterior areas of traction were severed with the vitreous cutter.  Core vitrectomy was carried out uneventfully with care taken to remove the vitreous up to the vitreous base for 360 degrees. The more braod neovascular foci were then delaminated carefully with the vitreous cutter. Once all fibrovascular tissue was removed, endolaser in a pan-retinal scatter pattern was applied. Focal applications were made ove rthe areas associated with any residual bleeding.  Total air/Fluid Exchange was carried out with a flex tipped cannula. SF6 gas was mixed by the surgeon at 20% concentration. A total of 50cc SF6 at that concentration was passed through the eye to insure uniform  concentration.  The cannulas were removed from the 9:30 and 2:30 positions with concommitant tamponade using a cotton tipped applicator. Subconjunctival injections of Ancef 100mg /0.64ml and Dexamethasone 4mg /32ml were placed in the infero-medial quadrant to avoid proximity to the cannula sites. The tip of the needle was directly visualized during injection.  The infusion cannula was removed with concomitant tamponade with the cotton tipped applicator leaving the ocular pressure less than 20 by palpation.  The speculum and drapes were removed and the eye was patched with Polymixin/Bacitracin ophthalmic ointment. An eye shield was placed and the patient was transferred alert and conversant with stable vital signs to the post operative recovery area.  Shade Flood MD

## 2013-07-16 NOTE — H&P (View-Only) (Signed)
Patient Record  [name] [name], [name] [name]  Patient Number:  [id number] Date of Birth:  September 24, 1965 Age:  47 years old    Gender:  Female Date of Evaluation:  July 11, 2013 Referred by:  [facility],  Chief Complaint:   47 year old female presents for PRP for PDR,  VH OS. Vitreous hemorrhage noted OU on last visit. She is post PRP OD by Dr. Brewington. DM x 28 years. Hx of addiction. History of Present Illness:   DM x 28 years. Hx of Amblyopia OD. PDR OU, early Cataracts OU. Past History:  Allergies:  Codeine, hives rash, Active Medications:   Other Medications:  Omeprazole 40mg, Sertraline HCL 100mg, Novolog Flexpen 100 unit/ml, Lantus Pen needles, Lantus for opticlik moounit,, Reglan 10mg, Pravastatin Sodium 40mg, Tramadol HCL 50mg, Lisinopril 5mg, Plavix 75 mg, Metoprolol Tartrate 25mg, Nitroglycerin 0.5mg,, Trexall 5mg, Folic Acid 1mg, Accu-Chek aviva kit, Accu-chek aviva strip, accu-chek soft touch lancets misc., Ferrous Sulfate 325mg tabs. Birth History:  none Past Ocular History:  none Past Medical History:   diabetes  heart disease  rheumatoid arthritis   Hiller blood pressure [place] cholesterol Past Surgical History:   angioplasty  PRP OD Family History:  no amblyopia, no blindness, no cataracts, no crossed eyes, no diabetic retinopathy, no glaucoma, no macular degeneration, no retinal detachment, + cancer (mother), + diabetes (aunt), no heart disease, no Mcbreen blood pressure, no stroke Social History:   Smoking Status: current every day smoker  Alcohol:  none   Driving status:  not driving Marital status:  married Review of Systems:   Eyes: + decreased vision, + loss of vision  Cardiovascular:  + Padovano blood pressure, + [place] cholesterol  Musculoskeletal: + rheumatic arthritis  Endocrine: + diabetes  All other systems are negative. Examination:  Visual Acuity:   Distance VA cc:  OD: 20/300  Distance VA [place]:  OD: 20/200    OS: 20/100 IOP:  OD:  20     OS:  16    @ 08:35AM  (Goldmann applanation) Pupils: OU:  Shape, size, direct and consensual reaction normal  Adnexa:  Preauricular LN, lacrimal drainage, lacrimal glands, orbit normal  Eyelids: Eyelids:  normal Conjunctiva: OU:  bulbar, palpebral normal  Cornea: OD:  epithelium, stroma, endothelium, tear film normal OS:  Scar without staining Anterior Chamber:  OU:  depth normal, no cell, no flare, 3+ deep / clear  Iris: OU:  normal, rubeosis absent Dilation:  OU: [place] 1%/ N 2.5% @ 03:12PM  Lens: OU:  1+ nuclear sclerotic cataract  Vitreous:  OU:  Vitreous Hemorrhage OU with sub-hyaloid component  Optic Disc:  OD:  cupping: 0.45 , NVD  OS:  cupping: 0.45 , NVD  Macula:  OD:  normal  OU:  dot and blot heme OU, NVE    Vessels: OU:  PDR  Periphery:  OD:  normal, Hemorrhage inferior to macula OS:  NVE>10A; H and E OU:  cotton wool spots  Orientation to person, place and time:  Normal  Mood and affect:  Normal  Impression:  250.50  Diabetes, type II, 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 She expresses the desire to have the blood removed. Given the heme is non-clearing I concur with the plan to   proceed with surgery.  She indicated understanding our discussion and felt that her questions had been answered to her satisfaction.  She indicates that she desires to proceed with the recommended treatment/care plan.   Medications:   Continue customary medications prescribed by PCP Patient Instructions: Please do not eat anything after mignight the day before surgery. Return to clinic:  Dec 18th, 5:00 PM for post-procedure follow-up, PDR, VH OS, dilate OU,   Schedule:  Pars Plana, Vitrectomy, Membrane Peeling,  Endolaser left eye   (electronically signed)  Pritesh Sobecki, MD 

## 2013-07-16 NOTE — Preoperative (Signed)
Beta Blockers   Reason not to administer Beta Blockers:Patient took BB this am 

## 2013-07-16 NOTE — Anesthesia Postprocedure Evaluation (Signed)
  Anesthesia Post-op Note  Patient: [name] [name]  Procedure(s) Performed: Procedure(s) with comments: PARS PLANA VITRECTOMY WITH 23 GAUGE (Left) MEMBRANE PEEL (Left) PHOTOCOAGULATION WITH LASER (Left) - ENDOLASER INSERTION OF GAS (Left) - SF6  Patient Location: PACU  Anesthesia Type:MAC  Level of Consciousness: awake, alert , oriented and patient cooperative  Airway and Oxygen Therapy: Patient Spontanous Breathing  Post-op Pain: mild  Post-op Assessment: Post-op Vital signs reviewed, Patient's Cardiovascular Status Stable, Respiratory Function Stable, Patent Airway, No signs of Nausea or vomiting and Pain level controlled  Post-op Vital Signs: stable  Complications: No apparent anesthesia complications

## 2013-07-16 NOTE — OR Nursing (Signed)
Shade Flood, MD inserted SF6 gas for 23 gauge pars plana vitrectomy procedure. An information card regarding the gas insertion with MD contact information was placed on the hard copy chart. A green information armband regarding the gas insertion with MD contact information was placed on the patient's left arm.  Oralia Manis, RN

## 2013-07-16 NOTE — Anesthesia Preprocedure Evaluation (Signed)
Anesthesia Evaluation  Patient identified by MRN, date of birth, ID band Patient awake    Reviewed: Allergy & Precautions, H&P , NPO status , Patient's Chart, lab work & pertinent test results  Airway       Dental   Pulmonary Current Smoker,          Cardiovascular hypertension, + angina + CAD, + Past MI, + Cardiac Stents and + Orthopnea     Neuro/Psych  Headaches, Anxiety Depression CVA    GI/Hepatic PUD, GERD-  ,  Endo/Other  diabetes, Type 1, Insulin Dependent  Renal/GU CRFRenal disease     Musculoskeletal  (+) Arthritis -,   Abdominal   Peds  Hematology  (+) Blood dyscrasia, anemia ,   Anesthesia Other Findings   Reproductive/Obstetrics                           Anesthesia Physical Anesthesia Plan  ASA: III  Anesthesia Plan: MAC   Post-op Pain Management:    Induction: Intravenous  Airway Management Planned:   Additional Equipment:   Intra-op Plan:   Post-operative Plan:   Informed Consent: I have reviewed the patients History and Physical, chart, labs and discussed the procedure including the risks, benefits and alternatives for the proposed anesthesia with the patient or authorized representative who has indicated his/her understanding and acceptance.     Plan Discussed with:   Anesthesia Plan Comments:         Anesthesia Quick Evaluation

## 2013-07-16 NOTE — Transfer of Care (Signed)
Immediate Anesthesia Transfer of Care Note  Patient: [name] [name]  Procedure(s) Performed: Procedure(s) with comments: PARS PLANA VITRECTOMY WITH 23 GAUGE (Left) MEMBRANE PEEL (Left) PHOTOCOAGULATION WITH LASER (Left) - ENDOLASER INSERTION OF GAS (Left) - SF6  Patient Location: PACU  Anesthesia Type:MAC  Level of Consciousness: awake, alert , oriented and patient cooperative  Airway & Oxygen Therapy: Patient Spontanous Breathing  Post-op Assessment: Report given to PACU RN, Post -op Vital signs reviewed and stable and Patient moving all extremities  Post vital signs: Reviewed and stable  Complications: No apparent anesthesia complications

## 2013-07-17 ENCOUNTER — Encounter (HOSPITAL_COMMUNITY): Payer: Self-pay | Admitting: Ophthalmology

## 2013-07-21 IMAGING — CR DG CHEST 2V
2 series · 2 of 2 positions shown · non-contrast
Comparison: 07/02/2012.

CLINICAL DATA: Cough and congestion.

CHEST - 2 VIEW

[w chest pa]
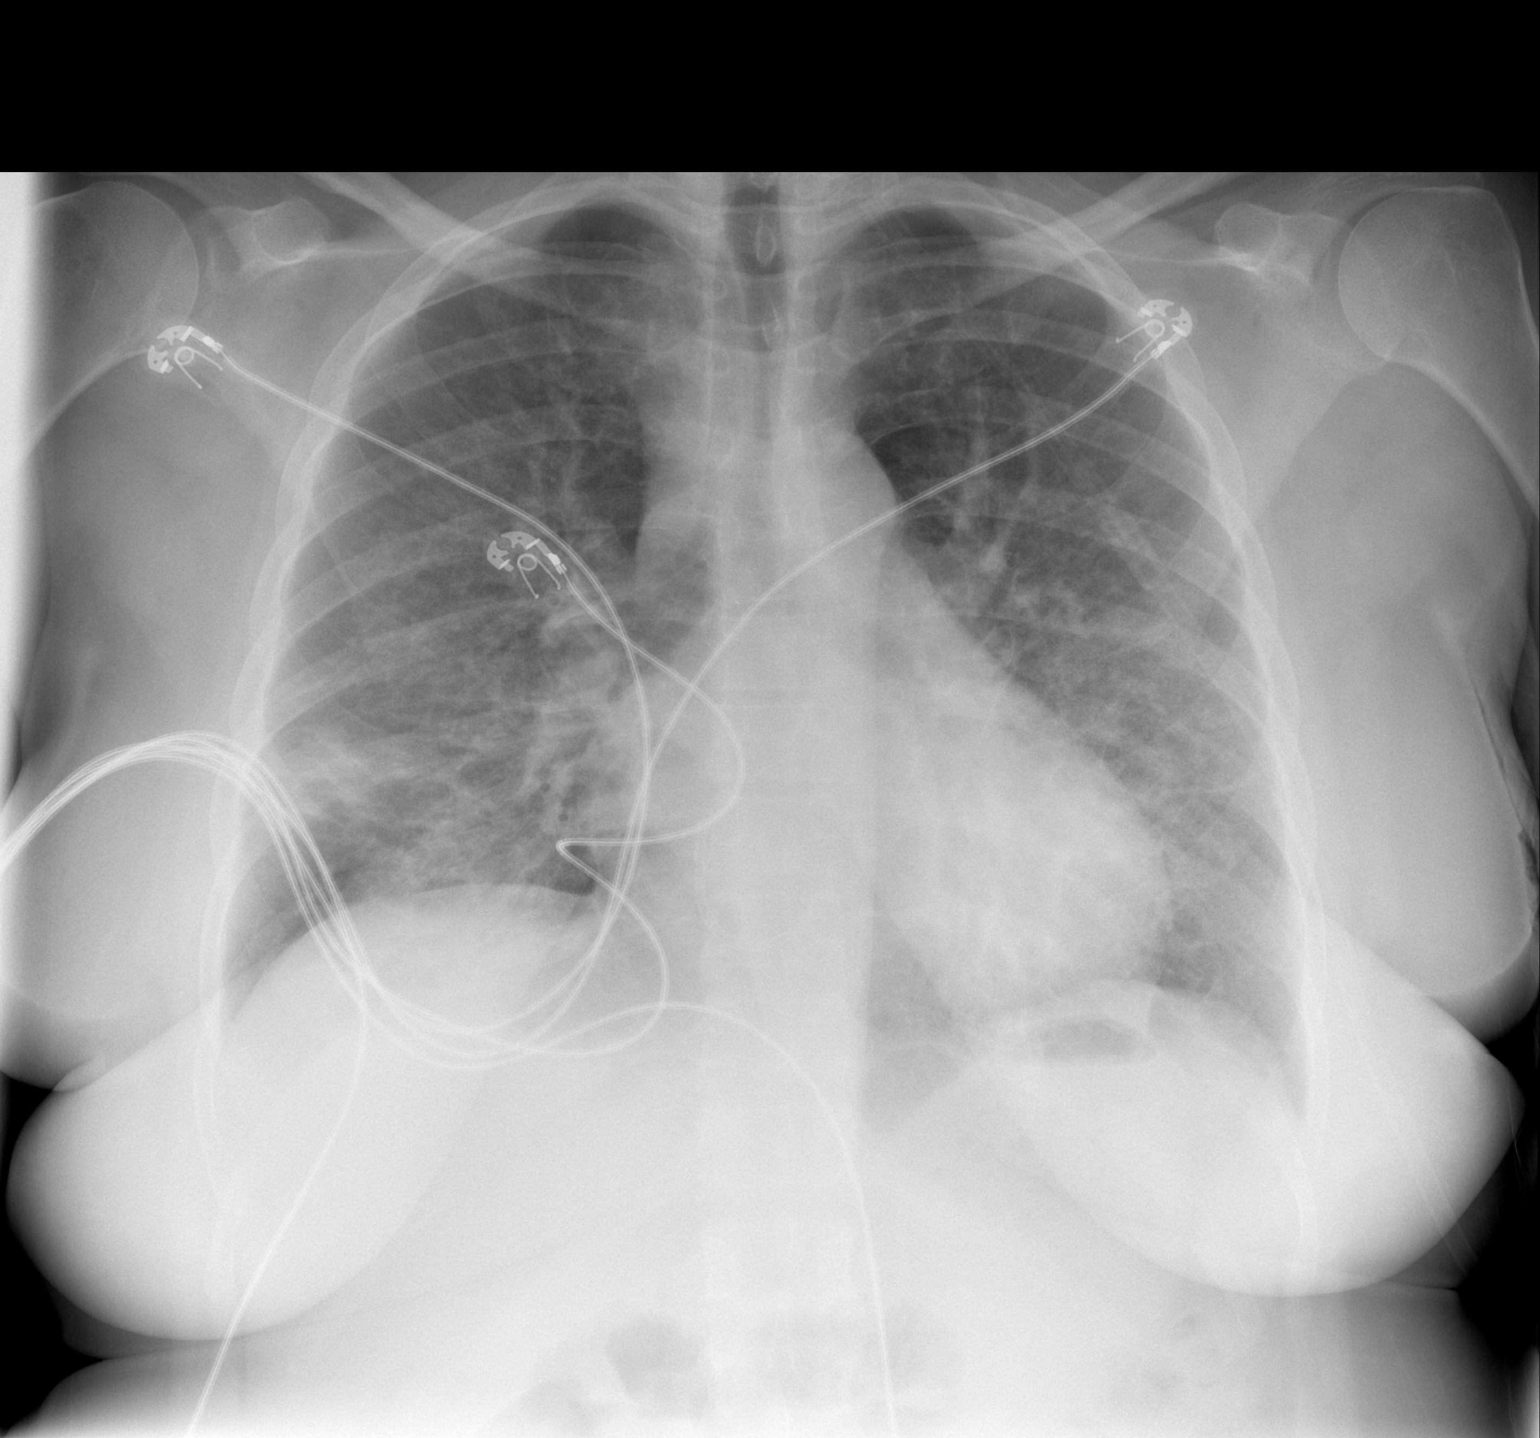

[w chest lat]
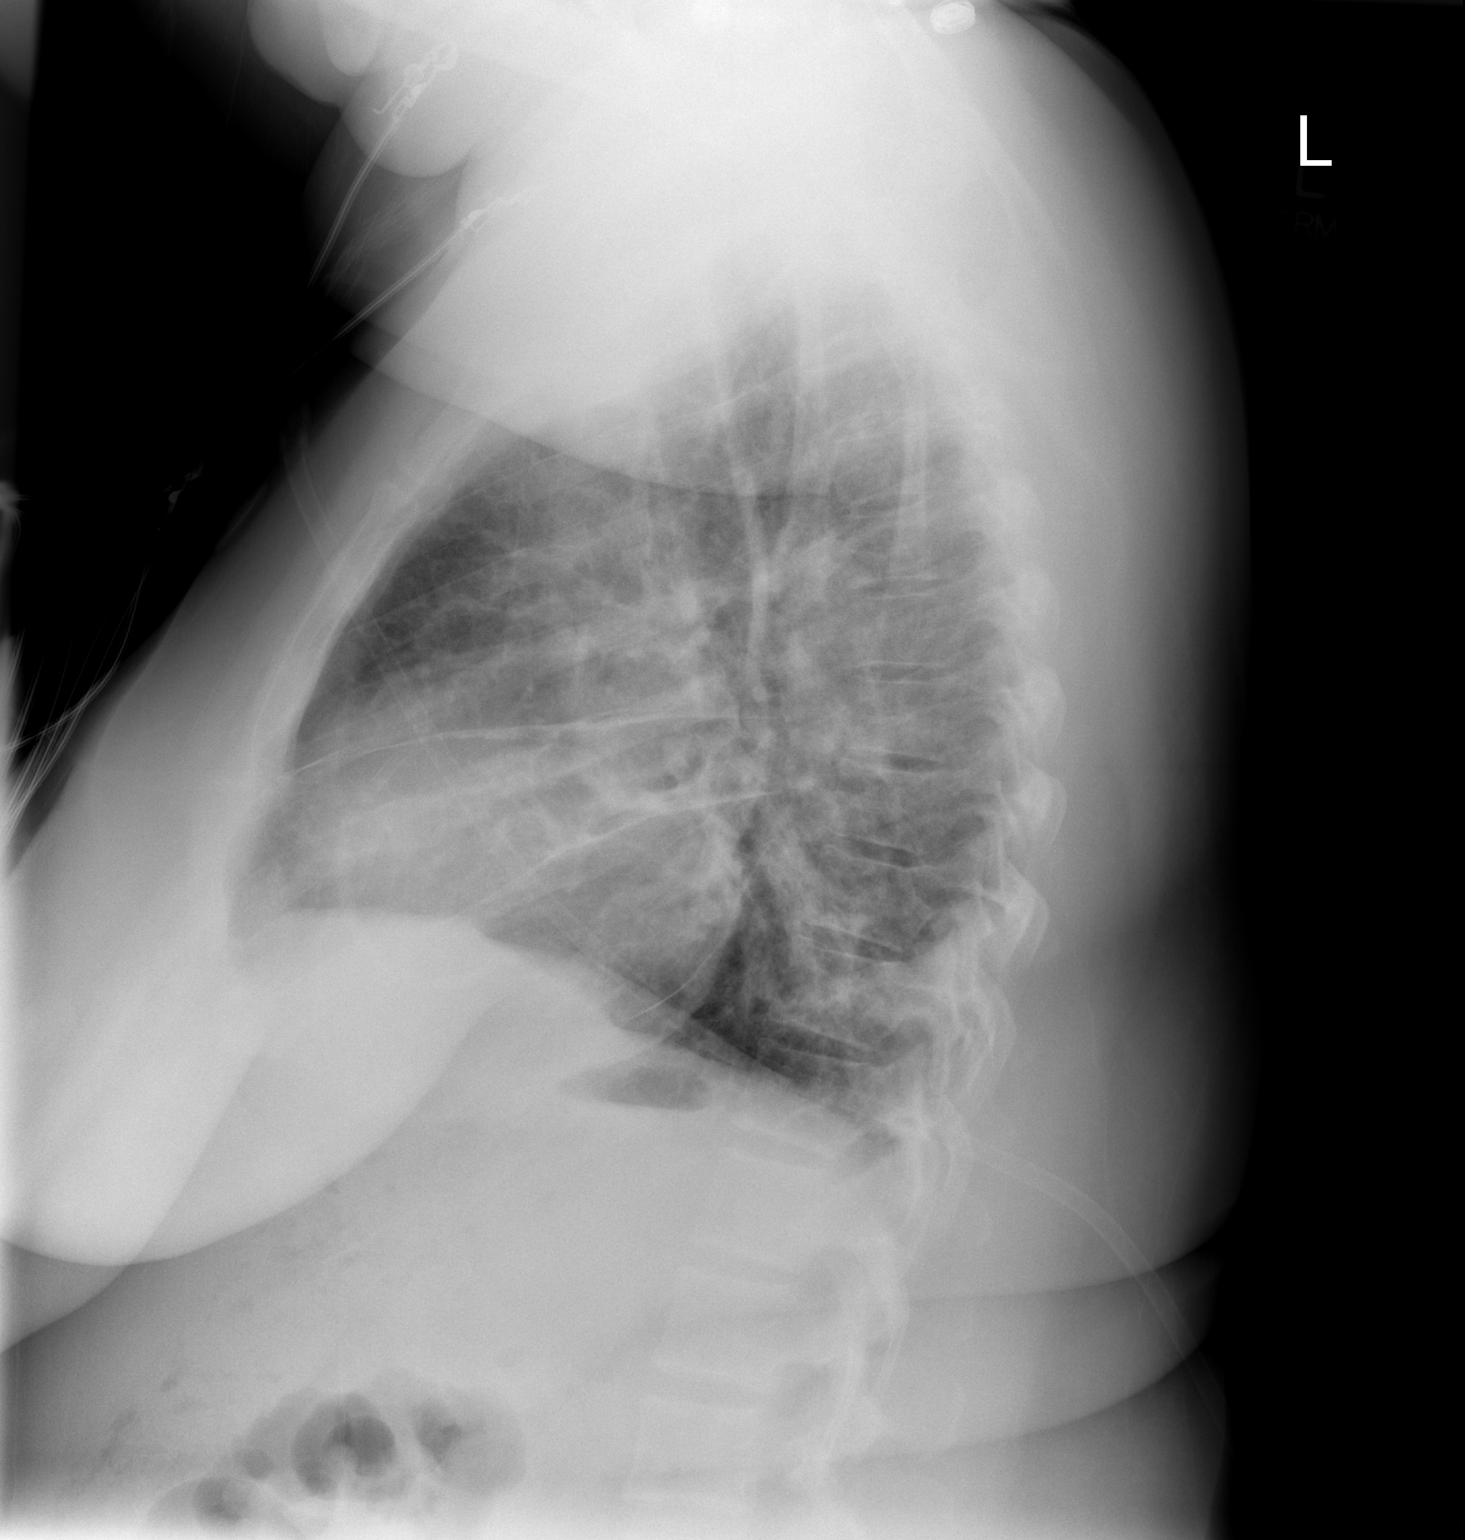

[2 of 2 positions shown; findings below may reference images not displayed]

FINDINGS: The cardiac silhouette, mediastinal and hilar contours
are within normal limits and stable.  There are patchy bilateral
infiltrates.  No pleural effusion.  The bony thorax is intact.
IMPRESSION: Patchy bilateral lung infiltrates.

## 2013-07-22 MED ORDER — VARENICLINE TARTRATE 0.5 MG X 11 & 1 MG X 42 PO MISC: ORAL | Status: DC

## 2013-07-22 MED ORDER — VARENICLINE TARTRATE 1 MG PO TABS: 1.0000 mg | ORAL_TABLET | Freq: Two times a day (BID) | ORAL | Status: DC

## 2013-07-22 NOTE — Telephone Encounter (Signed)
Pt notified. I will send in the Chantix.

## 2013-07-28 ENCOUNTER — Other Ambulatory Visit: Payer: Self-pay | Admitting: Ophthalmology

## 2013-07-28 NOTE — H&P (Signed)
Patient Record  [name], [name]  Patient Number:  [id number] Date of Birth:  September 24, 1965 Age:  47 years old    Gender:  Female Date of Evaluation:  July 28, 2013 Referred by:  [facility],  Chief Complaint:   47 year old female is 2 weeks post PPV, MP, EL for  PDR with  VH OS. She was noted to have a sugnificant hyphema on POD 1. When asked if she was on plavix the patient indicated that she was no longer taking it. It appears she is indeed on the generic version which likely has been contributing to her persistent heme.   History of Present Illness:   DM x 28 years. Hx of Amblyopia OD. PDR OU, early Cataracts OU. Past History:  Allergies:  Codeine, hives rash, Active Medications:   Ocular Medications:  Diamox po QID Combigan 1 gtt OU Q12 hrs Dorzolamide 1 gtt OS Q12 hrs Zymaxid 1 gtt OS QID Other Medications:  Omeprazole 40mg, Sertraline HCL 100mg, Novolog Flexpen 100 unit/ml, Lantus Pen needles, Lantus for opticlik moounit,, Reglan 10mg, Pravastatin Sodium 40mg, Tramadol HCL 50mg, Lisinopril 5mg, Plavix 75 mg, Metoprolol Tartrate 25mg, Nitroglycerin 0.5mg,, Trexall 5mg, Folic Acid 1mg, Accu-Chek aviva kit, Accu-chek aviva strip, accu-chek soft touch lancets misc., Ferrous Sulfate 325mg tabs., Clopidogrel Bisulfate( Plavix) daily Past Ocular History:  none Past Medical History:   diabetes  heart disease  rheumatoid arthritis   [place] blood pressure [place] cholesterol Past Surgical History:   angioplasty  PRP OD Family History:  no amblyopia, no blindness, no cataracts, no crossed eyes, no diabetic retinopathy, no glaucoma, no macular degeneration, no retinal detachment, + cancer (mother), + diabetes (aunt), no heart disease, no [place] blood pressure, no stroke Social History:   Smoking Status: current every day smoker  Alcohol:  none   Driving status:  not driving Marital status:  married Review of Systems:   Eyes: + decreased vision, + loss of vision  Cardiovascular:  +  Absher blood pressure, + Barnard cholesterol  Musculoskeletal: + rheumatic arthritis  Endocrine: + diabetes  All other systems are negative.  Examination:  Visual Acuity:   Distance VA [place]:  OD: 20/200    OS: LP  IOP:  OD:  26    @ 4:15PM (Goldmann applanation) Manifest Refraction:    Sphere    Cyl Axis       VA         Add       VA Prism Base R:  +3.25               20/200       +1.50                      L:  +1.50  -1.25   90    20/30       +1.50                        Pupils:  OU:  Shape, size, direct and consensual reaction normal  Adnexa:  Preauricular LN, lacrimal drainage, lacrimal glands, orbit normal  Eyelids:  Eyelids:  normal Conjunctiva:  OD:  bulbar, palpebral normal OS:  injected  Cornea:  OD:  epithelium, stroma, endothelium, tear film normal OS:  early hematocornea, spk  Anterior Chamber:  OD:  3+ deep / clear OS:  depth normal, no cell, no flare, total hyphema  Iris:  OS:  No view currently OU:    normal, rubeosis absent Dilation:  OU: Tropicamide 1%/ N 2.5% @ 03:12PM  Lens:  OU:  1+ nuclear sclerotic cataract  Vitreous:  OS:  no view  Optic Disc:  OD:  cupping: 0.45 , NVD OS:  cupping: 0.45 , NVD  Macula:  OD:  normal OS:  hazy view due to gas fill OU:  dot and blot heme OU, NVE  Vessels:  OS:  post PPV removal of multiple NVE OU:  PDR    Periphery:  OD:  normal, Hemorrhage inferior to macula OU:  cotton wool spots    Orientation to person, place and time:  Normal  Mood and affect:  Normal  Impression:  250.50  Diabetes, type II, with ophthalmic manifestations (not stated as controlled) 362.02  Proliferative Diabetic Retinopathy OU 379.23  -Vitreous Hemorrhage OU, sub-hyaloid heme OU -  --post PPV, MP, EL, FGX OD x 07/16/2013 364.41  ---Hyphema of iris and ciliary body: non-clearing -  --post PRP OS x 05/22/2013 365.04  Ocular Hypertension OU: inc IOP related to Hyphema OS 366.16  Nuclear Sclerotic Cataract: early  Plan/Treatment:  She is  asked to continue her  topical pressure medications as well as her Diamox.  We will schedule evacuation of Hyphema and vitrectomy 07/30/2013. I discussed the bleeding as likley related to the multiple vessels that were dissected from the retina at surgery along with being on Plavix. I will discuss our findings at surgery. She has brisk LP which is consistent with a total hyphema.  She indicated understanding our discussion and felt that her questions had been answered to her satisfaction.  She indicates that she desires to proceed with the recommended treatment/care plan.   Medications:   Zymaxid 1 gtt operative eye QID  Prednisolone Acetate 1 gtt operative eye  QID  Combigan 1 gtt operative eye BID  Dozolamide 1 gtt OS Q 12 hrs Patient Instructions: Please call for any pain, sensitivity to light, redness, tenderness to touch, drainage from your eye. Please do not eat anything after mignight the day before surgery. Return to clinic:  07/31/2013 at 9:00 AM for post-procedure follow-up, PDR, VH, Hyphema OS  Schedule:  Pars Plana Vitrectomy, probable Endolaser, Evacuation of Hyphema OS x 07/30/2013.   (electronically signed)  Harlyn Italiano, MD   

## 2013-07-29 ENCOUNTER — Encounter (HOSPITAL_COMMUNITY): Payer: Self-pay | Admitting: Pharmacy Technician

## 2013-07-29 ENCOUNTER — Encounter (HOSPITAL_COMMUNITY): Payer: Self-pay | Admitting: *Deleted

## 2013-07-30 ENCOUNTER — Encounter (HOSPITAL_COMMUNITY)
Admission: RE | Disposition: A | Discharge status: Home / Self Care | Payer: Self-pay | Source: Ambulatory Visit | Attending: Ophthalmology

## 2013-07-30 ENCOUNTER — Encounter (HOSPITAL_COMMUNITY): Payer: Medicaid Other | Admitting: Anesthesiology

## 2013-07-30 ENCOUNTER — Ambulatory Visit (HOSPITAL_COMMUNITY)
Admission: RE | Admit: 2013-07-30 | Discharge: 2013-07-30 | Disposition: A | Discharge status: Home / Self Care | Payer: Medicaid Other | Source: Ambulatory Visit | Attending: Ophthalmology | Admitting: Ophthalmology

## 2013-07-30 ENCOUNTER — Encounter (HOSPITAL_COMMUNITY): Payer: Self-pay | Admitting: *Deleted

## 2013-07-30 ENCOUNTER — Ambulatory Visit (HOSPITAL_COMMUNITY): Payer: Medicaid Other | Admitting: Anesthesiology

## 2013-07-30 DIAGNOSIS — Z8673 Personal history of transient ischemic attack (TIA), and cerebral infarction without residual deficits: Secondary | ICD-10-CM | POA: Insufficient documentation

## 2013-07-30 DIAGNOSIS — M069 Rheumatoid arthritis, unspecified: Secondary | ICD-10-CM | POA: Insufficient documentation

## 2013-07-30 DIAGNOSIS — E7800 Pure hypercholesterolemia, unspecified: Secondary | ICD-10-CM | POA: Insufficient documentation

## 2013-07-30 DIAGNOSIS — E11359 Type 2 diabetes mellitus with proliferative diabetic retinopathy without macular edema: Secondary | ICD-10-CM | POA: Insufficient documentation

## 2013-07-30 DIAGNOSIS — H4310 Vitreous hemorrhage, unspecified eye: Secondary | ICD-10-CM | POA: Insufficient documentation

## 2013-07-30 DIAGNOSIS — H40059 Ocular hypertension, unspecified eye: Secondary | ICD-10-CM | POA: Insufficient documentation

## 2013-07-30 DIAGNOSIS — Z9861 Coronary angioplasty status: Secondary | ICD-10-CM | POA: Insufficient documentation

## 2013-07-30 DIAGNOSIS — F17200 Nicotine dependence, unspecified, uncomplicated: Secondary | ICD-10-CM | POA: Insufficient documentation

## 2013-07-30 DIAGNOSIS — E1139 Type 2 diabetes mellitus with other diabetic ophthalmic complication: Secondary | ICD-10-CM | POA: Insufficient documentation

## 2013-07-30 DIAGNOSIS — H2100 Hyphema, unspecified eye: Secondary | ICD-10-CM | POA: Insufficient documentation

## 2013-07-30 DIAGNOSIS — Z794 Long term (current) use of insulin: Secondary | ICD-10-CM | POA: Insufficient documentation

## 2013-07-30 DIAGNOSIS — F319 Bipolar disorder, unspecified: Secondary | ICD-10-CM | POA: Insufficient documentation

## 2013-07-30 DIAGNOSIS — I2510 Atherosclerotic heart disease of native coronary artery without angina pectoris: Secondary | ICD-10-CM | POA: Insufficient documentation

## 2013-07-30 DIAGNOSIS — H31419 Hemorrhagic choroidal detachment, unspecified eye: Secondary | ICD-10-CM | POA: Insufficient documentation

## 2013-07-30 DIAGNOSIS — I10 Essential (primary) hypertension: Secondary | ICD-10-CM | POA: Insufficient documentation

## 2013-07-30 DIAGNOSIS — I252 Old myocardial infarction: Secondary | ICD-10-CM | POA: Insufficient documentation

## 2013-07-30 DIAGNOSIS — H2510 Age-related nuclear cataract, unspecified eye: Secondary | ICD-10-CM | POA: Insufficient documentation

## 2013-07-30 HISTORY — PX: PARS PLANA VITRECTOMY: SHX2166

## 2013-07-30 HISTORY — PX: GAS/FLUID EXCHANGE: SHX5334

## 2013-07-30 LAB — GLUCOSE, CAPILLARY: Glucose-Capillary: 148 mg/dL — ABNORMAL HIGH (ref 70–99)

## 2013-07-30 LAB — POCT I-STAT 4, (NA,K, GLUC, HGB,HCT)
HCT: 42 % (ref 36.0–46.0)
Hemoglobin: 14.3 g/dL (ref 12.0–15.0)
Potassium: 3.6 mEq/L — ABNORMAL LOW (ref 3.7–5.3)
Sodium: 143 mEq/L (ref 137–147)

## 2013-07-30 SURGERY — PARS PLANA VITRECTOMY WITH 23 GAUGE
Anesthesia: General | Site: Eye | Laterality: Left

## 2013-07-30 MED ORDER — FENTANYL CITRATE 0.05 MG/ML IJ SOLN: 25.0000 ug | INTRAMUSCULAR | Status: DC | PRN

## 2013-07-30 MED ORDER — BUPIVACAINE HCL (PF) 0.75 % IJ SOLN
INTRAMUSCULAR | Status: AC
  Filled 2013-07-30: qty 10

## 2013-07-30 MED ORDER — CYCLOPENTOLATE-PHENYLEPHRINE OP SOLN OPTIME - NO CHARGE
OPHTHALMIC | Status: AC
  Filled 2013-07-30: qty 2

## 2013-07-30 MED ORDER — TRIAMCINOLONE ACETONIDE 40 MG/ML IJ SUSP
INTRAMUSCULAR | Status: AC
  Filled 2013-07-30: qty 5

## 2013-07-30 MED ORDER — LIDOCAINE HCL 2 % IJ SOLN
INTRAMUSCULAR | Status: DC | PRN
  Administered 2013-07-30: 08:00:00 via RETROBULBAR

## 2013-07-30 MED ORDER — CYCLOPENTOLATE HCL 2 % OP SOLN
3.0000 [drp] | OPHTHALMIC | Status: DC
  Administered 2013-07-30 (×3): 1 [drp] via OPHTHALMIC
  Filled 2013-07-30 (×2): qty 2

## 2013-07-30 MED ORDER — SODIUM CHLORIDE 0.9 % IV SOLN
INTRAVENOUS | Status: DC | PRN
  Administered 2013-07-30: 08:00:00 via INTRAVENOUS

## 2013-07-30 MED ORDER — ACETAZOLAMIDE SODIUM 500 MG IJ SOLR
INTRAMUSCULAR | Status: AC
  Filled 2013-07-30: qty 500

## 2013-07-30 MED ORDER — HOMATROPINE HBR 2 % OP SOLN
1.0000 [drp] | OPHTHALMIC | Status: DC | PRN
  Filled 2013-07-30: qty 5

## 2013-07-30 MED ORDER — CEFAZOLIN SUBCONJUNCTIVAL INJECTION 100 MG/0.5 ML
INJECTION | SUBCONJUNCTIVAL | Status: DC | PRN
  Administered 2013-07-30: 200 mg via SUBCONJUNCTIVAL

## 2013-07-30 MED ORDER — TETRACAINE HCL 0.5 % OP SOLN
OPHTHALMIC | Status: AC
  Filled 2013-07-30: qty 2

## 2013-07-30 MED ORDER — ACETAZOLAMIDE SODIUM 500 MG IJ SOLR
500.0000 mg | Freq: Once | INTRAMUSCULAR | Status: AC
  Administered 2013-07-30: 500 mg via INTRAVENOUS
  Filled 2013-07-30: qty 500

## 2013-07-30 MED ORDER — LIDOCAINE HCL 2 % IJ SOLN
INTRAMUSCULAR | Status: AC
  Filled 2013-07-30: qty 20

## 2013-07-30 MED ORDER — HYPROMELLOSE (GONIOSCOPIC) 2.5 % OP SOLN
OPHTHALMIC | Status: AC
  Filled 2013-07-30: qty 15

## 2013-07-30 MED ORDER — CEFAZOLIN SUBCONJUNCTIVAL INJECTION 100 MG/0.5 ML
200.0000 mg | INJECTION | SUBCONJUNCTIVAL | Status: DC
  Filled 2013-07-30: qty 1

## 2013-07-30 MED ORDER — BACITRACIN-POLYMYXIN B 500-10000 UNIT/GM OP OINT
TOPICAL_OINTMENT | OPHTHALMIC | Status: DC | PRN
  Administered 2013-07-30: 1 via OPHTHALMIC

## 2013-07-30 MED ORDER — BSS PLUS IO SOLN: INTRAOCULAR | Status: DC | PRN

## 2013-07-30 MED ORDER — BSS PLUS IO SOLN
INTRAOCULAR | Status: AC
  Filled 2013-07-30: qty 500

## 2013-07-30 MED ORDER — BACITRACIN-POLYMYXIN B 500-10000 UNIT/GM OP OINT
TOPICAL_OINTMENT | OPHTHALMIC | Status: AC
  Filled 2013-07-30: qty 3.5

## 2013-07-30 MED ORDER — DEXAMETHASONE SODIUM PHOSPHATE 10 MG/ML IJ SOLN
INTRAMUSCULAR | Status: AC
  Filled 2013-07-30: qty 1

## 2013-07-30 MED ORDER — FENTANYL CITRATE 0.05 MG/ML IJ SOLN: 50.0000 ug | Freq: Once | INTRAMUSCULAR | Status: DC

## 2013-07-30 MED ORDER — DEXAMETHASONE SODIUM PHOSPHATE 10 MG/ML IJ SOLN
INTRAMUSCULAR | Status: DC | PRN
  Administered 2013-07-30: 10 mg via INTRAVENOUS

## 2013-07-30 MED ORDER — NA CHONDROIT SULF-NA HYALURON 40-30 MG/ML IO SOLN
INTRAOCULAR | Status: DC | PRN
  Administered 2013-07-30: 0.5 mL via INTRAOCULAR

## 2013-07-30 MED ORDER — PHENYLEPHRINE HCL 2.5 % OP SOLN
1.0000 [drp] | OPHTHALMIC | Status: DC | PRN
  Administered 2013-07-30 (×2): 1 [drp] via OPHTHALMIC

## 2013-07-30 MED ORDER — GATIFLOXACIN 0.5 % OP SOLN
1.0000 [drp] | OPHTHALMIC | Status: DC | PRN
  Administered 2013-07-30 (×2): 1 [drp] via OPHTHALMIC

## 2013-07-30 MED ORDER — EPINEPHRINE HCL 1 MG/ML IJ SOLN
INTRAOCULAR | Status: DC | PRN
  Administered 2013-07-30: 08:00:00

## 2013-07-30 MED ORDER — PREDNISOLONE ACETATE 1 % OP SUSP
1.0000 [drp] | OPHTHALMIC | Status: AC
  Administered 2013-07-30: 1 [drp] via OPHTHALMIC
  Filled 2013-07-30: qty 5

## 2013-07-30 MED ORDER — ONDANSETRON HCL 4 MG/2ML IJ SOLN
INTRAMUSCULAR | Status: DC | PRN
  Administered 2013-07-30: 4 mg via INTRAVENOUS

## 2013-07-30 MED ORDER — LIDOCAINE HCL (CARDIAC) 20 MG/ML IV SOLN
INTRAVENOUS | Status: DC | PRN
  Administered 2013-07-30: 80 mg via INTRAVENOUS

## 2013-07-30 MED ORDER — ROCURONIUM BROMIDE 100 MG/10ML IV SOLN
INTRAVENOUS | Status: DC | PRN
  Administered 2013-07-30: 40 mg via INTRAVENOUS

## 2013-07-30 MED ORDER — PHENYLEPHRINE HCL 2.5 % OP SOLN
OPHTHALMIC | Status: AC
  Administered 2013-07-30: 1 [drp] via OPHTHALMIC
  Filled 2013-07-30: qty 15

## 2013-07-30 MED ORDER — PROPOFOL 10 MG/ML IV BOLUS
INTRAVENOUS | Status: DC | PRN
  Administered 2013-07-30: 160 mg via INTRAVENOUS

## 2013-07-30 MED ORDER — EPINEPHRINE HCL 1 MG/ML IJ SOLN
INTRAMUSCULAR | Status: AC
  Filled 2013-07-30: qty 1

## 2013-07-30 MED ORDER — TETRACAINE HCL 0.5 % OP SOLN
2.0000 [drp] | OPHTHALMIC | Status: AC
  Administered 2013-07-30: 2 [drp] via OPHTHALMIC
  Filled 2013-07-30: qty 2

## 2013-07-30 MED ORDER — SODIUM HYALURONATE 10 MG/ML IO SOLN
INTRAOCULAR | Status: AC
  Filled 2013-07-30: qty 0.85

## 2013-07-30 MED ORDER — GLYCOPYRROLATE 0.2 MG/ML IJ SOLN
INTRAMUSCULAR | Status: DC | PRN
  Administered 2013-07-30: 0.6 mg via INTRAVENOUS

## 2013-07-30 MED ORDER — FENTANYL CITRATE 0.05 MG/ML IJ SOLN
INTRAMUSCULAR | Status: DC | PRN
  Administered 2013-07-30 (×3): 50 ug via INTRAVENOUS

## 2013-07-30 MED ORDER — HYPROMELLOSE (GONIOSCOPIC) 2.5 % OP SOLN
OPHTHALMIC | Status: DC | PRN
  Administered 2013-07-30: 2 [drp] via OPHTHALMIC

## 2013-07-30 MED ORDER — GATIFLOXACIN 0.5 % OP SOLN
OPHTHALMIC | Status: AC
  Administered 2013-07-30: 1 [drp] via OPHTHALMIC
  Filled 2013-07-30: qty 2.5

## 2013-07-30 MED ORDER — 0.9 % SODIUM CHLORIDE (POUR BTL) OPTIME
TOPICAL | Status: DC | PRN
  Administered 2013-07-30: 1000 mL

## 2013-07-30 MED ORDER — NA CHONDROIT SULF-NA HYALURON 40-30 MG/ML IO SOLN
INTRAOCULAR | Status: AC
  Filled 2013-07-30: qty 0.5

## 2013-07-30 MED ORDER — PROMETHAZINE HCL 25 MG/ML IJ SOLN: 6.2500 mg | INTRAMUSCULAR | Status: DC | PRN

## 2013-07-30 MED ORDER — BSS IO SOLN
INTRAOCULAR | Status: DC | PRN
  Administered 2013-07-30: 15 mL via INTRAOCULAR

## 2013-07-30 MED ORDER — NEOSTIGMINE METHYLSULFATE 1 MG/ML IJ SOLN
INTRAMUSCULAR | Status: DC | PRN
  Administered 2013-07-30: 5 mg via INTRAVENOUS

## 2013-07-30 MED ORDER — MIDAZOLAM HCL 2 MG/2ML IJ SOLN: 1.0000 mg | INTRAMUSCULAR | Status: DC | PRN

## 2013-07-30 SURGICAL SUPPLY — 75 items
APPLICATOR COTTON TIP 6IN STRL (MISCELLANEOUS) ×2 IMPLANT
APPLICATOR DR MATTHEWS STRL (MISCELLANEOUS) IMPLANT
BAG MINI COLL DRAIN (WOUND CARE) ×2 IMPLANT
BLADE EYE MINI 60D BEAVER (BLADE) IMPLANT
BLADE KERATOME 2.75 (BLADE) IMPLANT
BLADE MVR KNIFE 19G (BLADE) IMPLANT
BLADE STAB KNIFE 15DEG (BLADE) IMPLANT
CANNULA DUAL BORE 23G (CANNULA) IMPLANT
CANNULA VLV SOFT TIP 23GA (OPHTHALMIC) ×2 IMPLANT
CHANDELIER CONSTEL 25G RFID (OPHTHALMIC) ×2 IMPLANT
CORDS BIPOLAR (ELECTRODE) IMPLANT
COVER MAYO STAND STRL (DRAPES) ×2 IMPLANT
DRAPE OPHTHALMIC 77X100 STRL (CUSTOM PROCEDURE TRAY) ×2 IMPLANT
DRAPE POUCH INSTRU U-SHP 10X18 (DRAPES) ×2 IMPLANT
DRSG TEGADERM 4X4.75 (GAUZE/BANDAGES/DRESSINGS) ×2 IMPLANT
ERASER HMR WETFIELD 23G BP (MISCELLANEOUS) IMPLANT
FILTER BLUE MILLIPORE (MISCELLANEOUS) IMPLANT
FORCEPS GRIESHABER ILM 25G A (INSTRUMENTS) IMPLANT
GAS AUTO FILL CONSTEL (OPHTHALMIC) ×2
GAS AUTO FILL CONSTELLATION (OPHTHALMIC) ×1 IMPLANT
GLOVE SS BIOGEL STRL SZ 6.5 (GLOVE) ×1 IMPLANT
GLOVE SUPERSENSE BIOGEL SZ 6.5 (GLOVE) ×1
GLOVE SURG SS PI 6.5 STRL IVOR (GLOVE) ×6 IMPLANT
GLOVE SURG SS PI 7.0 STRL IVOR (GLOVE) ×2 IMPLANT
GOWN SRG XL XLNG 56XLVL 4 (GOWN DISPOSABLE) ×1 IMPLANT
GOWN STRL NON-REIN LRG LVL3 (GOWN DISPOSABLE) ×6 IMPLANT
GOWN STRL NON-REIN XL XLG LVL4 (GOWN DISPOSABLE) ×1
HANDLE PNEUMATIC FOR CONSTEL (OPHTHALMIC) IMPLANT
ILLUMINATOR WD SAPHIRE 23G (OPHTHALMIC) ×2 IMPLANT
KIT BASIN OR (CUSTOM PROCEDURE TRAY) ×2 IMPLANT
KIT ROOM TURNOVER OR (KITS) ×2 IMPLANT
KNIFE CRESCENT 1.75 EDGEAHEAD (BLADE) IMPLANT
KNIFE GRIESHABER SHARP 2.5MM (MISCELLANEOUS) ×2 IMPLANT
MASK EYE SHIELD (GAUZE/BANDAGES/DRESSINGS) ×2 IMPLANT
NEEDLE 18GX1X1/2 (RX/OR ONLY) (NEEDLE) ×2 IMPLANT
NEEDLE 22X1 1/2 (OR ONLY) (NEEDLE) IMPLANT
NEEDLE 25GX 5/8IN NON SAFETY (NEEDLE) ×2 IMPLANT
NEEDLE 27GX1/2 REG BEVEL ECLIP (NEEDLE) ×2 IMPLANT
NEEDLE FILTER BLUNT 18X 1/2SAF (NEEDLE) ×1
NEEDLE FILTER BLUNT 18X1 1/2 (NEEDLE) ×1 IMPLANT
NEEDLE HYPO 30X.5 LL (NEEDLE) ×6 IMPLANT
NS IRRIG 1000ML POUR BTL (IV SOLUTION) ×2 IMPLANT
PACK FRAGMATOME (OPHTHALMIC) IMPLANT
PACK VITRECTOMY CUSTOM (CUSTOM PROCEDURE TRAY) ×2 IMPLANT
PAD ARMBOARD 7.5X6 YLW CONV (MISCELLANEOUS) ×4 IMPLANT
PAD EYE OVAL STERILE LF (GAUZE/BANDAGES/DRESSINGS) ×2 IMPLANT
PAK VITRECTOMY PIK  23GA (OPHTHALMIC RELATED) ×2 IMPLANT
PENCIL BIPOLAR 25GA STR DISP (OPHTHALMIC RELATED) IMPLANT
PROBE LASER ILLUM FLEX CVD 23G (OPHTHALMIC) IMPLANT
ROLLS DENTAL (MISCELLANEOUS) ×4 IMPLANT
SCISSORS TIP ADVANCED DSP 25GA (INSTRUMENTS) IMPLANT
SCISSORS TIP GREISHABER 23 VER (OPHTHALMIC) IMPLANT
SCRAPER DIAMOND DUST MEMBRANE (MISCELLANEOUS) IMPLANT
SPEAR EYE SURG WECK-CEL (MISCELLANEOUS) ×14 IMPLANT
SUT ETHILON 10 0 CS140 6 (SUTURE) IMPLANT
SUT ETHILON 4 0 P 3 18 (SUTURE) IMPLANT
SUT ETHILON 5 0 P 3 18 (SUTURE)
SUT ETHILON 9 0 TG140 8 (SUTURE) IMPLANT
SUT NYLON 10.0 BLK (SUTURE) IMPLANT
SUT NYLON ETHILON 5-0 P-3 1X18 (SUTURE) IMPLANT
SUT PLAIN 6 0 TG1408 (SUTURE) IMPLANT
SUT POLY NON ABSORB 10-0 8 STR (SUTURE) IMPLANT
SUT VICRYL 7 0 TG140 8 (SUTURE) IMPLANT
SUT VICRYL ABS 6-0 S29 18IN (SUTURE) IMPLANT
SYR 20CC LL (SYRINGE) ×2 IMPLANT
SYR 5ML LL (SYRINGE) IMPLANT
SYR TB 1ML LUER SLIP (SYRINGE) ×2 IMPLANT
SYRINGE 10CC LL (SYRINGE) IMPLANT
TAPE SURG TRANSPORE 1 IN (GAUZE/BANDAGES/DRESSINGS) ×1 IMPLANT
TAPE SURGICAL TRANSPORE 1 IN (GAUZE/BANDAGES/DRESSINGS) ×1
TOWEL OR 17X24 6PK STRL BLUE (TOWEL DISPOSABLE) ×6 IMPLANT
TUBE EXTENSION HAMMER (TUBING) IMPLANT
WATER STERILE IRR 1000ML POUR (IV SOLUTION) ×2 IMPLANT
WIPE INSTRUMENT ADHESIVE BACK (MISCELLANEOUS) ×2 IMPLANT
WIPE INSTRUMENT VISIWIPE 73X73 (MISCELLANEOUS) ×2 IMPLANT

## 2013-07-30 NOTE — Progress Notes (Signed)
Spoke with Dr.Edwards about using labs from the 16th of Dec-wishes for [place] to just do an [place]

## 2013-07-30 NOTE — Op Note (Signed)
[name] [name] 07/30/2013 Diagnosis: Hemorrhagic Choroidal Detachment                      Hyphema  Procedure: Pars Plana Vitrectomy and Fluid Gas Exchange, Evacuation of Hyphema using Paracentesis with displacement of Anterior Chamber blood Operative Eye:  left eye  Surgeon: Shade Flood Estimated Blood Loss: 1 cc Specimens for Pathology:  None Complications: none   The  patient was prepped and draped in the usual fashion for ocular surgery on the  left eye .  A solid lid speculum was placed. The conjunctiva was displaced with a cotton tipped applicator at the  7:30  meridian. A trocar/cannula was placed 3.5 mm from the surgical limbus. The cannula could not bevisualized in the vitreous cavity. The infusion line was allowed to run and then clamped when placed at the cannula opening. The line was inserted and secured to the drape with an adhesive strip.  A peripheral clear corneal stab incision was made at the 9:00 meridian and the 2:00 meridian to permit displacement of anterior chamber blood. BSS was used on a blunt cannula initially with displacement of dark blood. Viscoat viscoelastic was then used to maintain the [facility] to permit visualization of the posterior pole. She was noted to have early hematocornea which gave [place] a hazy view. An inferior chandelier light pipe was inserted at the 5:30 meridian.  There was dense blood noted in the posterior Ole but the light could be visualized. During insertion of the superior cannula at the 9:30 meridian dark blood was noted to backflow into the infusion line. On initial cannula insertion dark blood exited the cannula confirming the suspicion of a Hemorrhagic Choroidal Detachment. Approximately 1 ml of dark sub-choroidal blood was evacuated in this manner. The retina could just be visualized and the infusion cannula was confirmed to be within the vitreous cavity with a hazy view. The choroidal was flattened in the periphery. Cannulas were then placed at the 9:00 and  2:00 meridians. The vitreous cutter and light pipe were inserted into the vitreous cavity without complication. Additional vitreous hemorrhage was removed using the vitreous cutter. There was some residual pre-retinal hemorrhage layered over the macula, but the view was poor for doing removal without risking touching the retina. I elected to do Fluid Gas exchange which would tamponade the retina, allowing the heme to complete reabsorbing post-operatively. I infused the eye with 50 cc of 15 % C3F8 gas using a 30g needle for egress to insure uniform concentration. The ocular pressure was less than 20 palpably at the close of the procedure.  The cannulas were removed from the 9:30 and 2:30 and 5:30  positions with concommitant tamponade using a cotton tipped applicator. Subconjunctival injections of Ancef 100mg /0.9ml and Dexamethasone 4mg /3ml were placed in the infero-medial quadrant to avoid proximity to the cannula sites.   The speculum and drapes were removed and the eye was patched with Polymixin/Bacitracin ophthalmic ointment. An eye shield was placed and the patient was uneventfully extubated and  transferred to the post-operative recovery area with stable vital signs.  Shade Flood MD

## 2013-07-30 NOTE — H&P (View-Only) (Signed)
Patient Record  [name], [name]  Patient Number:  [id number] Date of Birth:  September 24, 1965 Age:  47 years old    Gender:  Female Date of Evaluation:  July 11, 2013 Referred by:  [facility],  Chief Complaint:   47 year old female presents for PRP for PDR,  VH OS. Vitreous hemorrhage noted OU on last visit. She is post PRP OD by Dr. Brewington. DM x 28 years. Hx of addiction. History of Present Illness:   DM x 28 years. Hx of Amblyopia OD. PDR OU, early Cataracts OU. Past History:  Allergies:  Codeine, hives rash, Active Medications:   Other Medications:  Omeprazole 40mg, Sertraline HCL 100mg, Novolog Flexpen 100 unit/ml, Lantus Pen needles, Lantus for opticlik moounit,, Reglan 10mg, Pravastatin Sodium 40mg, Tramadol HCL 50mg, Lisinopril 5mg, Plavix 75 mg, Metoprolol Tartrate 25mg, Nitroglycerin 0.5mg,, Trexall 5mg, Folic Acid 1mg, Accu-Chek aviva kit, Accu-chek aviva strip, accu-chek soft touch lancets misc., Ferrous Sulfate 325mg tabs. Birth History:  none Past Ocular History:  none Past Medical History:   diabetes  heart disease  rheumatoid arthritis   [place] blood pressure Riggins cholesterol Past Surgical History:   angioplasty  PRP OD Family History:  no amblyopia, no blindness, no cataracts, no crossed eyes, no diabetic retinopathy, no glaucoma, no macular degeneration, no retinal detachment, + cancer (mother), + diabetes (aunt), no heart disease, no [place]lli blood pressure, no stroke Social History:   Smoking Status: current every day smoker  Alcohol:  none   Driving status:  not driving Marital status:  married Review of Systems:   Eyes: + decreased vision, + loss of vision  Cardiovascular:  + [place] blood pressure, + [place] cholesterol  Musculoskeletal: + rheumatic arthritis  Endocrine: + diabetes  All other systems are negative. Examination:  Visual Acuity:   Distance VA [place]:  OD: 20/300  Distance VA [place]:  OD: 20/200    OS: 20/100 IOP:  OD:  20     OS:  16    @ 08:35AM  (Goldmann applanation) Pupils: OU:  Shape, size, direct and consensual reaction normal  Adnexa:  Preauricular LN, lacrimal drainage, lacrimal glands, orbit normal  Eyelids: Eyelids:  normal Conjunctiva: OU:  bulbar, palpebral normal  Cornea: OD:  epithelium, stroma, endothelium, tear film normal OS:  Scar without staining Anterior Chamber:  OU:  depth normal, no cell, no flare, 3+ deep / clear  Iris: OU:  normal, rubeosis absent Dilation:  OU: Tropicamide 1%/ N 2.5% @ 03:12PM  Lens: OU:  1+ nuclear sclerotic cataract  Vitreous:  OU:  Vitreous Hemorrhage OU with sub-hyaloid component  Optic Disc:  OD:  cupping: 0.45 , NVD  OS:  cupping: 0.45 , NVD  Macula:  OD:  normal  OU:  dot and blot heme OU, NVE    Vessels: OU:  PDR  Periphery:  OD:  normal, Hemorrhage inferior to macula OS:  NVE>10A; H and E OU:  cotton wool spots  Orientation to person, place and time:  Normal  Mood and affect:  Normal  Impression:  250.50  Diabetes, type II, 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 She expresses the desire to have the blood removed. Given the heme is non-clearing I concur with the plan to   proceed with surgery.  She indicated understanding our discussion and felt that her questions had been answered to her satisfaction.  She indicates that she desires to proceed with the recommended treatment/care plan.   Medications:   Continue customary medications prescribed by PCP Patient Instructions: Please do not eat anything after mignight the day before surgery. Return to clinic:  Dec 18th, 5:00 PM for post-procedure follow-up, PDR, VH OS, dilate OU,   Schedule:  Pars Plana, Vitrectomy, Membrane Peeling,  Endolaser left eye   (electronically signed)  Arushi Partridge, MD 

## 2013-07-30 NOTE — Interval H&P Note (Signed)
History and Physical Interval Note:  07/30/2013 8:14 AM  [name] [name]  has presented today for surgery, with the diagnosis of Hyphema, Vitreous Hemorrhage Left eye  The various methods of treatment have been discussed with the patient and family. After consideration of risks, benefits and other options for treatment, the patient has consented to  Procedure(s): PARS PLANA VITRECTOMY WITH 23 GAUGE (Left), Evacuation of Hyphema Left  as a surgical intervention .  The patient's history has been reviewed, patient examined, no change in status, stable for surgery.  I have reviewed the patient's chart and labs.  Questions were answered to the patient's satisfaction.   BP 117/68  Pulse 76  Temp(Src) 97 F (36.1 C) (Oral)  Resp 20  SpO2 100%  LMP 05/13/2013 There are no contraindications for planned surgery.   Kandiss Ihrig, Waynette Buttery

## 2013-07-30 NOTE — H&P (View-Only) (Signed)
Patient Record  [name] [name], [name] [name]  Patient Number:  [id number] Date of Birth:  1966-02-21 Age:  47 years old    Gender:  Female Date of Evaluation:  July 28, 2013 Referred by:  [facility],  Chief Complaint:   47 year old female is 2 weeks post PPV, MP, EL for  PDR with  VH OS. She was noted to have a sugnificant hyphema on POD 1. When asked if she was on plavix the patient indicated that she was no longer taking it. It appears she is indeed on the generic version which likely has been contributing to her persistent heme.   History of Present Illness:   DM x 28 years. Hx of Amblyopia OD. PDR OU, early Cataracts OU. Past History:  Allergies:  Codeine, hives rash, Active Medications:   Ocular Medications:  Diamox po QID Combigan 1 gtt OU Q12 hrs Dorzolamide 1 gtt OS Q12 hrs Zymaxid 1 gtt OS QID Other Medications:  Omeprazole 40mg , Sertraline HCL 100mg , Novolog Flexpen 100 unit/ml, Lantus Pen needles, Lantus for opticlik moounit,, Reglan 10mg , Pravastatin Sodium 40mg , Tramadol HCL 50mg , Lisinopril 5mg , Plavix 75 mg, Metoprolol Tartrate 25mg , Nitroglycerin 0.5mg ,, Trexall 5mg , Folic Acid 1mg , Accu-Chek aviva kit, Accu-chek aviva strip, accu-chek soft touch lancets misc., Ferrous Sulfate 325mg  tabs., Clopidogrel Bisulfate( Plavix) daily Past Ocular History:  none Past Medical History:   diabetes  heart disease  rheumatoid arthritis   [place] blood pressure Dani cholesterol Past Surgical History:   angioplasty  PRP OD Family History:  no amblyopia, no blindness, no cataracts, no crossed eyes, no diabetic retinopathy, no glaucoma, no macular degeneration, no retinal detachment, + cancer (mother), + diabetes (aunt), no heart disease, no [place] blood pressure, no stroke Social History:   Smoking Status: current every day smoker  Alcohol:  none   Driving status:  not driving Marital status:  married Review of Systems:   Eyes: + decreased vision, + loss of vision  Cardiovascular:  +  [place] blood pressure, + [place] cholesterol  Musculoskeletal: + rheumatic arthritis  Endocrine: + diabetes  All other systems are negative.  Examination:  Visual Acuity:   Distance VA [place]:  OD: 20/200    OS: LP  IOP:  OD:  26    @ 4:15PM (Goldmann applanation) Manifest Refraction:    Sphere    Cyl Axis       VA         Add       VA Prism Base R:  +3.25               20/200       +1.50                      L:  +1.50  -1.25   90    20/30       +1.50                        Pupils:  OU:  Shape, size, direct and consensual reaction normal  Adnexa:  Preauricular LN, lacrimal drainage, lacrimal glands, orbit normal  Eyelids:  Eyelids:  normal Conjunctiva:  OD:  bulbar, palpebral normal OS:  injected  Cornea:  OD:  epithelium, stroma, endothelium, tear film normal OS:  early hematocornea, spk  Anterior Chamber:  OD:  3+ deep / clear OS:  depth normal, no cell, no flare, total hyphema  Iris:  OS:  No view currently OU:  normal, rubeosis absent Dilation:  OU: Tropicamide 1%/ N 2.5% @ 03:12PM  Lens:  OU:  1+ nuclear sclerotic cataract  Vitreous:  OS:  no view  Optic Disc:  OD:  cupping: 0.45 , NVD OS:  cupping: 0.45 , NVD  Macula:  OD:  normal OS:  hazy view due to gas fill OU:  dot and blot heme OU, NVE  Vessels:  OS:  post PPV removal of multiple NVE OU:  PDR    Periphery:  OD:  normal, Hemorrhage inferior to macula OU:  cotton wool spots    Orientation to person, place and time:  Normal  Mood and affect:  Normal  Impression:  250.50  Diabetes, type II, with ophthalmic manifestations (not stated as controlled) 362.02  Proliferative Diabetic Retinopathy OU 379.23  -Vitreous Hemorrhage OU, sub-hyaloid heme OU -  --post PPV, MP, EL, FGX OD x 07/16/2013 364.41  ---Hyphema of iris and ciliary body: non-clearing -  --post PRP OS x 05/22/2013 365.04  Ocular Hypertension OU: inc IOP related to Hyphema OS 366.16  Nuclear Sclerotic Cataract: early  Plan/Treatment:  She is  asked to continue her  topical pressure medications as well as her Diamox.  We will schedule evacuation of Hyphema and vitrectomy 07/30/2013. I discussed the bleeding as likley related to the multiple vessels that were dissected from the retina at surgery along with being on Plavix. I will discuss our findings at surgery. She has brisk LP which is consistent with a total hyphema.  She indicated understanding our discussion and felt that her questions had been answered to her satisfaction.  She indicates that she desires to proceed with the recommended treatment/care plan.   Medications:   Zymaxid 1 gtt operative eye QID  Prednisolone Acetate 1 gtt operative eye  QID  Combigan 1 gtt operative eye BID  Dozolamide 1 gtt OS Q 12 hrs Patient Instructions: Please call for any pain, sensitivity to light, redness, tenderness to touch, drainage from your eye. Please do not eat anything after mignight the day before surgery. Return to clinic:  07/31/2013 at 9:00 AM for post-procedure follow-up, PDR, VH, Hyphema OS  Schedule:  Pars Plana Vitrectomy, probable Endolaser, Evacuation of Hyphema OS x 07/30/2013.   (electronically signed)  Shade Flood, MD

## 2013-07-30 NOTE — Anesthesia Procedure Notes (Signed)
Procedure Name: Intubation Date/Time: 07/30/2013 8:52 AM Performed by: Brien Mates D Pre-anesthesia Checklist: Patient being monitored, Emergency Drugs available, Patient identified, Suction available and Timeout performed Patient Re-evaluated:Patient Re-evaluated prior to inductionPreoxygenation: Pre-oxygenation with 100% oxygen Intubation Type: IV induction Ventilation: Mask ventilation without difficulty Laryngoscope Size: Miller and 2 Grade View: Grade I Tube type: Oral Tube size: 7.5 mm Number of attempts: 1 Airway Equipment and Method: Stylet Placement Confirmation: ETT inserted through vocal cords under direct vision,  positive ETCO2 and breath sounds checked- equal and bilateral Secured at: 21 cm Dental Injury: Teeth and Oropharynx as per pre-operative assessment

## 2013-07-30 NOTE — Progress Notes (Signed)
Dr.geiger notified that Atropine is on back order and unable to obtain;orders received to continue with Cyclodryl 2%

## 2013-07-30 NOTE — Anesthesia Postprocedure Evaluation (Signed)
  Anesthesia Post-op Note  Patient: [name] [name]  Procedure(s) Performed: Procedure(s) with comments: PARS PLANA VITRECTOMY WITH 23 GAUGE WITH ENDOLASER (Left) - with endolaser GAS/FLUID EXCHANGE (Left)  Patient Location: PACU  Anesthesia Type:General  Level of Consciousness: awake  Airway and Oxygen Therapy: Patient Spontanous Breathing  Post-op Pain: none  Post-op Assessment: Post-op Vital signs reviewed, Patient's Cardiovascular Status Stable, Respiratory Function Stable, Patent Airway, No signs of Nausea or vomiting and Pain level controlled  Post-op Vital Signs: Reviewed and stable  Complications: No apparent anesthesia complications

## 2013-07-30 NOTE — Transfer of Care (Signed)
Immediate Anesthesia Transfer of Care Note  Patient: [name] [name]  Procedure(s) Performed: Procedure(s) with comments: PARS PLANA VITRECTOMY WITH 23 GAUGE WITH ENDOLASER (Left) - with endolaser GAS/FLUID EXCHANGE (Left)  Patient Location: PACU  Anesthesia Type:General  Level of Consciousness: sedated  Airway & Oxygen Therapy: Patient Spontanous Breathing and Patient connected to face mask oxygen  Post-op Assessment: Report given to PACU RN and Post -op Vital signs reviewed and stable  Post vital signs: Reviewed and stable  Complications: No apparent anesthesia complications

## 2013-07-30 NOTE — Anesthesia Preprocedure Evaluation (Signed)
Anesthesia Evaluation  Patient identified by MRN, date of birth, ID band Patient awake    Reviewed: Allergy & Precautions, H&P , NPO status , Patient's Chart, lab work & pertinent test results  Airway Mallampati: II TM Distance: >3 FB Neck ROM: Full    Dental   Pulmonary shortness of breath, Current Smoker,  + rhonchi         Cardiovascular hypertension, + angina + CAD, + Past MI, + Cardiac Stents and + Orthopnea Rhythm:Regular Rate:Normal     Neuro/Psych  Headaches, Anxiety Depression Bipolar Disorder CVA    GI/Hepatic PUD, GERD-  ,(+)     substance abuse   ,   Endo/Other  diabetes, Type 1, Insulin Dependent  Renal/GU CRFRenal disease     Musculoskeletal  (+) Arthritis -,   Abdominal (+) + obese,   Peds  Hematology  (+) Blood dyscrasia, anemia ,   Anesthesia Other Findings   Reproductive/Obstetrics                           Anesthesia Physical Anesthesia Plan  ASA: IV  Anesthesia Plan: General   Post-op Pain Management:    Induction: Intravenous  Airway Management Planned: Oral ETT  Additional Equipment:   Intra-op Plan:   Post-operative Plan: Extubation in OR  Informed Consent: I have reviewed the patients History and Physical, chart, labs and discussed the procedure including the risks, benefits and alternatives for the proposed anesthesia with the patient or authorized representative who has indicated his/her understanding and acceptance.     Plan Discussed with: CRNA and Surgeon  Anesthesia Plan Comments:         Anesthesia Quick Evaluation

## 2013-07-30 NOTE — Interval H&P Note (Signed)
History and Physical Interval Note:  07/30/2013 8:15 AM  [name] [name]  has presented today for surgery, with the diagnosis of Hyphema, Vitreous Hemorrhage Left eye  The various methods of treatment have been discussed with the patient and family. After consideration of risks, benefits and other options for treatment, the patient has consented to  Procedure(s): PARS PLANA VITRECTOMY WITH 23 GAUGE (Left) as a surgical intervention .  The patient's history has been reviewed, patient examined, no change in status, stable for surgery.  I have reviewed the patient's chart and labs.  Questions were answered to the patient's satisfaction.     Shaqueta Casady, Waynette Buttery

## 2013-07-31 ENCOUNTER — Emergency Department (HOSPITAL_COMMUNITY): Payer: Medicaid Other

## 2013-07-31 ENCOUNTER — Encounter (HOSPITAL_COMMUNITY): Payer: Self-pay | Admitting: Emergency Medicine

## 2013-07-31 ENCOUNTER — Emergency Department (HOSPITAL_COMMUNITY)
Admission: EM | Admit: 2013-07-31 | Discharge: 2013-08-01 | Discharge status: Against Medical Advice | Payer: Medicaid Other | Attending: Emergency Medicine | Admitting: Emergency Medicine

## 2013-07-31 DIAGNOSIS — Z8673 Personal history of transient ischemic attack (TIA), and cerebral infarction without residual deficits: Secondary | ICD-10-CM | POA: Insufficient documentation

## 2013-07-31 DIAGNOSIS — K219 Gastro-esophageal reflux disease without esophagitis: Secondary | ICD-10-CM | POA: Insufficient documentation

## 2013-07-31 DIAGNOSIS — R0602 Shortness of breath: Secondary | ICD-10-CM | POA: Insufficient documentation

## 2013-07-31 DIAGNOSIS — R109 Unspecified abdominal pain: Secondary | ICD-10-CM | POA: Insufficient documentation

## 2013-07-31 DIAGNOSIS — R9431 Abnormal electrocardiogram [ECG] [EKG]: Secondary | ICD-10-CM

## 2013-07-31 DIAGNOSIS — R110 Nausea: Secondary | ICD-10-CM | POA: Insufficient documentation

## 2013-07-31 DIAGNOSIS — R040 Epistaxis: Secondary | ICD-10-CM | POA: Insufficient documentation

## 2013-07-31 DIAGNOSIS — Z7902 Long term (current) use of antithrombotics/antiplatelets: Secondary | ICD-10-CM | POA: Insufficient documentation

## 2013-07-31 DIAGNOSIS — E860 Dehydration: Secondary | ICD-10-CM | POA: Insufficient documentation

## 2013-07-31 DIAGNOSIS — I129 Hypertensive chronic kidney disease with stage 1 through stage 4 chronic kidney disease, or unspecified chronic kidney disease: Secondary | ICD-10-CM | POA: Insufficient documentation

## 2013-07-31 DIAGNOSIS — Z794 Long term (current) use of insulin: Secondary | ICD-10-CM | POA: Insufficient documentation

## 2013-07-31 DIAGNOSIS — M069 Rheumatoid arthritis, unspecified: Secondary | ICD-10-CM | POA: Insufficient documentation

## 2013-07-31 DIAGNOSIS — F17200 Nicotine dependence, unspecified, uncomplicated: Secondary | ICD-10-CM | POA: Insufficient documentation

## 2013-07-31 DIAGNOSIS — Z79899 Other long term (current) drug therapy: Secondary | ICD-10-CM | POA: Insufficient documentation

## 2013-07-31 DIAGNOSIS — F1910 Other psychoactive substance abuse, uncomplicated: Secondary | ICD-10-CM | POA: Insufficient documentation

## 2013-07-31 DIAGNOSIS — I2510 Atherosclerotic heart disease of native coronary artery without angina pectoris: Secondary | ICD-10-CM | POA: Insufficient documentation

## 2013-07-31 DIAGNOSIS — E785 Hyperlipidemia, unspecified: Secondary | ICD-10-CM | POA: Insufficient documentation

## 2013-07-31 DIAGNOSIS — J309 Allergic rhinitis, unspecified: Secondary | ICD-10-CM | POA: Insufficient documentation

## 2013-07-31 DIAGNOSIS — F319 Bipolar disorder, unspecified: Secondary | ICD-10-CM | POA: Insufficient documentation

## 2013-07-31 DIAGNOSIS — Z888 Allergy status to other drugs, medicaments and biological substances status: Secondary | ICD-10-CM | POA: Insufficient documentation

## 2013-07-31 DIAGNOSIS — R6889 Other general symptoms and signs: Secondary | ICD-10-CM | POA: Insufficient documentation

## 2013-07-31 DIAGNOSIS — R079 Chest pain, unspecified: Secondary | ICD-10-CM

## 2013-07-31 DIAGNOSIS — N289 Disorder of kidney and ureter, unspecified: Secondary | ICD-10-CM | POA: Insufficient documentation

## 2013-07-31 DIAGNOSIS — R197 Diarrhea, unspecified: Secondary | ICD-10-CM | POA: Insufficient documentation

## 2013-07-31 DIAGNOSIS — Z885 Allergy status to narcotic agent status: Secondary | ICD-10-CM | POA: Insufficient documentation

## 2013-07-31 DIAGNOSIS — E119 Type 2 diabetes mellitus without complications: Secondary | ICD-10-CM | POA: Insufficient documentation

## 2013-07-31 DIAGNOSIS — R0789 Other chest pain: Secondary | ICD-10-CM | POA: Insufficient documentation

## 2013-07-31 DIAGNOSIS — R011 Cardiac murmur, unspecified: Secondary | ICD-10-CM | POA: Insufficient documentation

## 2013-07-31 DIAGNOSIS — I252 Old myocardial infarction: Secondary | ICD-10-CM | POA: Insufficient documentation

## 2013-07-31 DIAGNOSIS — Z8669 Personal history of other diseases of the nervous system and sense organs: Secondary | ICD-10-CM | POA: Insufficient documentation

## 2013-07-31 DIAGNOSIS — Z8719 Personal history of other diseases of the digestive system: Secondary | ICD-10-CM | POA: Insufficient documentation

## 2013-07-31 DIAGNOSIS — D649 Anemia, unspecified: Secondary | ICD-10-CM | POA: Insufficient documentation

## 2013-07-31 DIAGNOSIS — F411 Generalized anxiety disorder: Secondary | ICD-10-CM | POA: Insufficient documentation

## 2013-07-31 DIAGNOSIS — Z8701 Personal history of pneumonia (recurrent): Secondary | ICD-10-CM | POA: Insufficient documentation

## 2013-07-31 DIAGNOSIS — M8610 Other acute osteomyelitis, unspecified site: Secondary | ICD-10-CM | POA: Insufficient documentation

## 2013-07-31 LAB — BASIC METABOLIC PANEL
BUN: 39 mg/dL — ABNORMAL HIGH (ref 6–23)
CO2: 13 meq/L — AB (ref 19–32)
CREATININE: 1.28 mg/dL — AB (ref 0.50–1.10)
Calcium: 9 mg/dL (ref 8.4–10.5)
Chloride: 107 mEq/L (ref 96–112)
GFR calc Af Amer: 57 mL/min — ABNORMAL LOW (ref >90)
GFR calc non Af Amer: 49 mL/min — ABNORMAL LOW (ref >90)
GLUCOSE: 202 mg/dL — AB (ref 70–99)
Potassium: 3.4 mEq/L — ABNORMAL LOW (ref 3.7–5.3)
Sodium: 138 mEq/L (ref 137–147)

## 2013-07-31 LAB — CBC
HEMATOCRIT: 37.4 % (ref 36.0–46.0)
HEMOGLOBIN: 12.9 g/dL (ref 12.0–15.0)
MCH: 32.4 pg (ref 26.0–34.0)
MCHC: 34.5 g/dL (ref 30.0–36.0)
MCV: 94 fL (ref 78.0–100.0)
Platelets: 279 10*3/uL (ref 150–400)
RBC: 3.98 MIL/uL (ref 3.87–5.11)
RDW: 16.2 % — ABNORMAL HIGH (ref 11.5–15.5)
WBC: 13.5 10*3/uL — ABNORMAL HIGH (ref 4.0–10.5)

## 2013-07-31 LAB — GLUCOSE, CAPILLARY
Glucose-Capillary: 86 mg/dL (ref 70–99)
Glucose-Capillary: 110 mg/dL — ABNORMAL HIGH (ref 70–99)
Glucose-Capillary: 182 mg/dL — ABNORMAL HIGH (ref 70–99)

## 2013-07-31 LAB — POCT I-STAT TROPONIN I: Troponin i, poc: 0.01 ng/mL (ref 0.00–0.08)

## 2013-07-31 LAB — LIPASE, BLOOD: Lipase: 14 U/L (ref 11–59)

## 2013-07-31 IMAGING — CR DG CHEST 1V PORT
1 series · 1 of 1 positions shown · non-contrast
Comparison: 12/19/2012

CLINICAL DATA: Chest pain

PORTABLE CHEST - 1 VIEW

[AP]
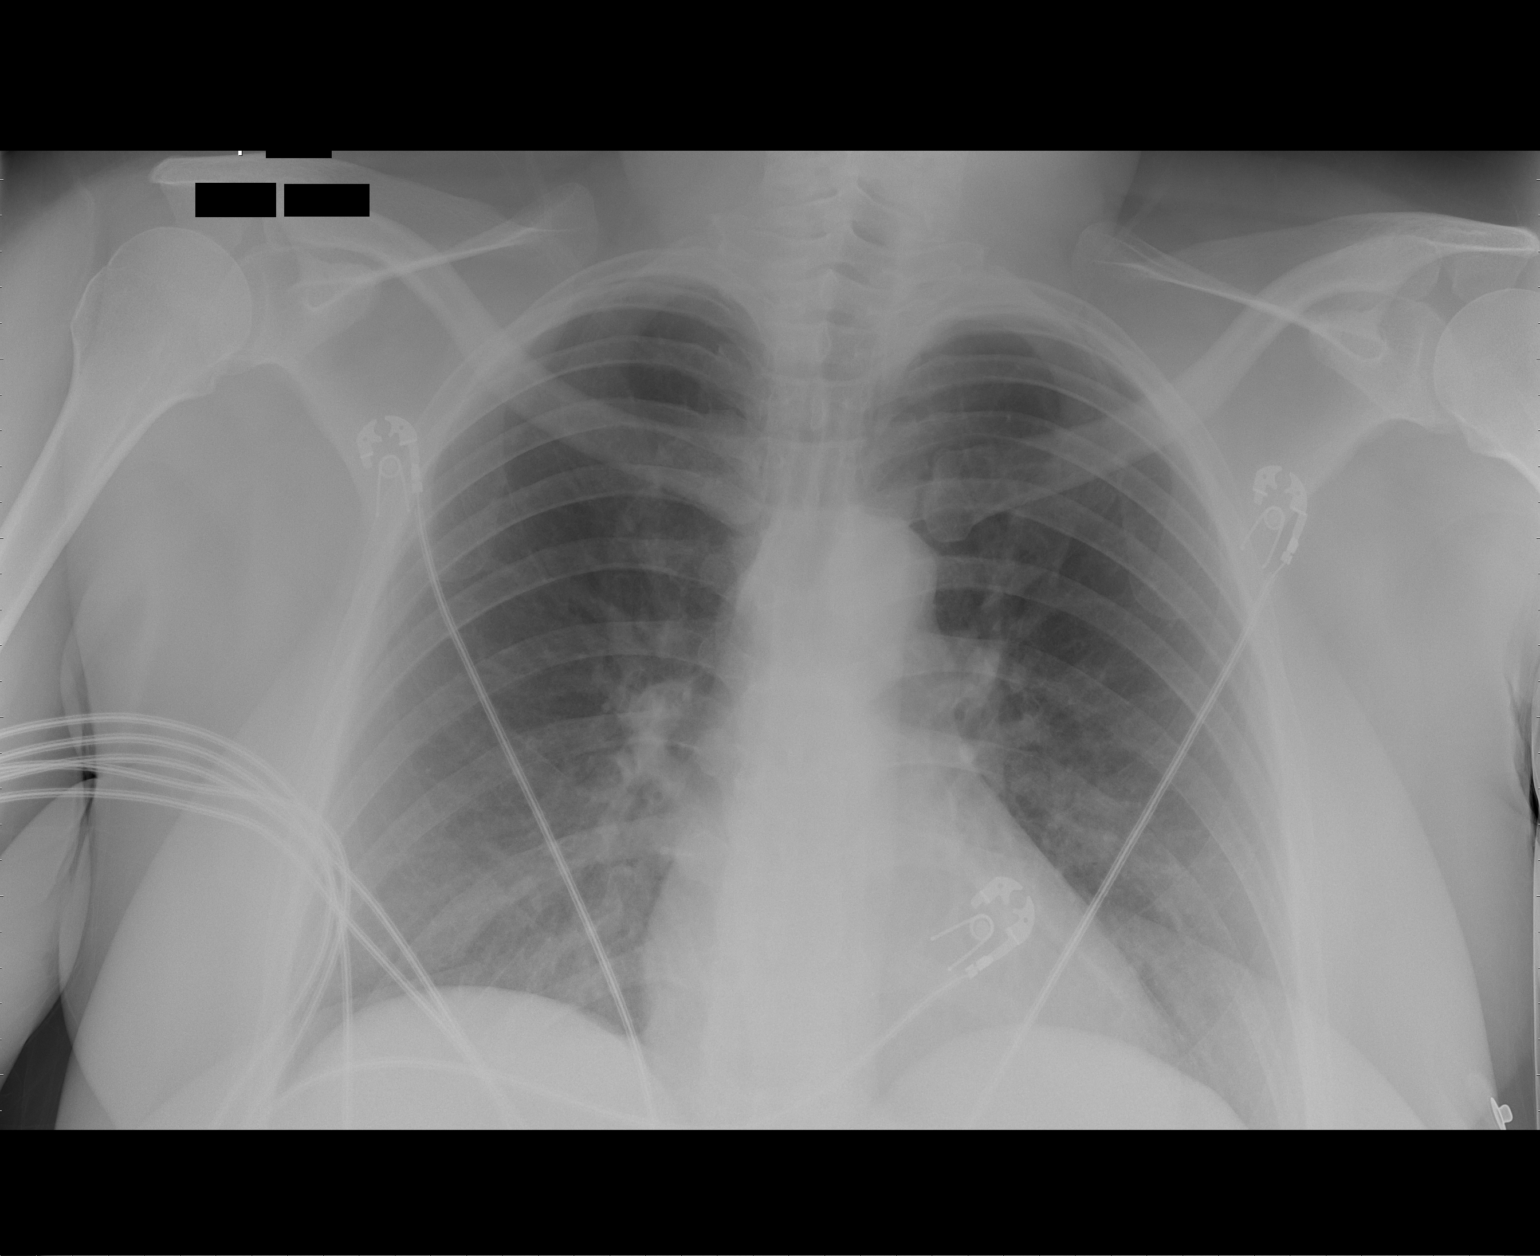

[1 of 1 positions shown; findings below may reference images not displayed]

FINDINGS: Patchy opacities noted on the prior study have
essentially resolved, with possible minimal residual lingular
opacity / scarring.

No pleural effusion or pneumothorax.

The heart is normal in size.
IMPRESSION: Patchy opacities noted on the prior study have essentially
resolved.

Possible minimal residual lingular opacity / scarring.

## 2013-07-31 MED ORDER — MORPHINE SULFATE 4 MG/ML IJ SOLN
4.0000 mg | Freq: Once | INTRAMUSCULAR | Status: AC
  Administered 2013-07-31: 4 mg via INTRAMUSCULAR

## 2013-07-31 MED ORDER — MORPHINE SULFATE 4 MG/ML IJ SOLN
4.0000 mg | Freq: Once | INTRAMUSCULAR | Status: DC
  Filled 2013-07-31: qty 1

## 2013-07-31 NOTE — ED Notes (Signed)
Attempted blood draw x1. Unable to get return.  Called phlebotomy

## 2013-07-31 NOTE — ED Notes (Signed)
Spoke with Patient about IV stick and CT scan. Pt is refusing to be stuck except for her foot. Pt is diabetic. Pt states that she is going home. PA informed that pt is leaving AMA. PA at bedside to go over risks and benefits to staying and leaving AMA.

## 2013-07-31 NOTE — ED Provider Notes (Signed)
CSN: [id number]     Arrival date & time 07/31/13  1547 History   First MD Initiated Contact with Patient 07/31/13 1645     Chief Complaint  Patient presents with  . Chest Pain   HPI  History provided by the patient. Patient is a 48 year old female with history of hypertension, diabetes and hyperlipidemia with CAD and coronary stents who presents with complaints of brief right-sided chest pain. Patient states around 3 PM she was walking to sit on her bed and suddenly sneezed. She also states that she suddenly felt the urge to use the bathroom and it had a bowel movement. Patient then took one sublingual nitroglycerin. She states she did not have much change of her pain but did have abdominal pains which she took Zofran for her. She then began to feel better but had to use the bathroom again 2 more times with bowel movements. They were described as soft not watery. No blood or mucus. Her chest and abdominal pains lasted approximately 30 minutes or less. She came to the emergency room around 4 PM and had been doing well for several hours. She does report that she had some return of her abdominal pains while she was waiting for lab testing and x-rays. She was given a dose of morphine and states this has helped her pain and currently denies any chest pain.  Patient was seen and evaluated by Mellody Drown PA-C. Initial workup had been started. She did speak with cardiologist on call about slight T-wave changes on EKG.     Past Medical History  Diagnosis Date  . Hypertension   . Sebaceous cyst   . Anemia   . Allergic rhinitis   . Hypertension   . Diabetic retinopathy   . Acute osteomyelitis   . Esophageal ulcer   . Esophageal stenosis   . Gastroparesis   . Renal insufficiency   . GERD (gastroesophageal reflux disease)   . Hyperlipidemia   . Depression   . Anxiety   . Polysubstance abuse   . Esophagitis   . CAD (coronary artery disease) 12/12    s/p DES mid and distal RCA with 50% LAD  .  Heart murmur   . Pneumonia 2012  . Orthopnoea   . Type II diabetes mellitus   . Inferior MI 01/20/2009    /notes on 12/19/2012, "that's the only one I've had" (12/19/2012)  . Acute myocardial infarction, unspecified site, initial episode of care   . Acute myocardial infarction of other lateral wall, initial episode of care   . Rheumatoid arthritis(714.0)   . Bipolar affective   . History of stomach ulcers   . Anginal pain     07/15/13- no chest pain in months"  . Stroke 2011    denies residual on 12/19/2012.  "Years ago"  . Daily headache     not daily  . Migraines    Past Surgical History  Procedure Laterality Date  . Irrigation and debridement sebaceous cyst Right 03/2011    "pointer" (12/19/2012)  . Coronary angioplasty with stent placement  01/20/2009    "2" (12/19/2012)  . Coronary angioplasty with stent placement  2012    "2" (12/19/2012)  . Coronary angioplasty with stent placement  12/19/2012    "2" (12/19/2012)  . Pars plana vitrectomy Left 07/16/2013    Procedure: PARS PLANA VITRECTOMY WITH 23 GAUGE;  Surgeon: Shade Flood, MD;  Location: [facility] OR;  Service: Ophthalmology;  Laterality: Left;  Marland Kitchen Membrane peel Left 07/16/2013  Procedure: MEMBRANE PEEL;  Surgeon: Shade Flood, MD;  Location: [facility] OR;  Service: Ophthalmology;  Laterality: Left;  . Photocoagulation with laser Left 07/16/2013    Procedure: PHOTOCOAGULATION WITH LASER;  Surgeon: Shade Flood, MD;  Location: [facility] OR;  Service: Ophthalmology;  Laterality: Left;  ENDOLASER  . Gas insertion Left 07/16/2013    Procedure: INSERTION OF GAS;  Surgeon: Shade Flood, MD;  Location: [facility] OR;  Service: Ophthalmology;  Laterality: Left;  SF6   Family History  Problem Relation Age of Onset  . Breast cancer Mother 72  . Hypertension Father   . Stomach cancer Sister   . Hypertension Sister   . Colon cancer Neg Hx    History  Substance Use Topics  . Smoking status: Current Every Day Smoker -- 0.50 packs/day for 22 years    Types:  Cigarettes  . Smokeless tobacco: Never Used  . Alcohol Use: Yes     Comment: 12/31/2012 "1 cooler/wk since mother's day; none since last week; clean 6 years before that"   OB History   Grav Para Term Preterm Abortions TAB SAB Ect Mult Living                 Review of Systems  Constitutional: Negative for fever, chills and diaphoresis.  Respiratory: Negative for cough and shortness of breath.   Cardiovascular: Positive for chest pain.  Gastrointestinal: Positive for abdominal pain and diarrhea. Negative for nausea, vomiting and constipation.  All other systems reviewed and are negative.    Allergies  Aspirin and Codeine  Home Medications   Current Outpatient Rx  Name  Route  Sig  Dispense  Refill  . acetaminophen (TYLENOL) 500 MG tablet   Oral   Take 500 mg by mouth every 6 (six) hours as needed for moderate pain.          . brinzolamide (AZOPT) 1 % ophthalmic suspension   Right Eye   Place 1 drop into the right eye 3 (three) times daily.         . clopidogrel (PLAVIX) 75 MG tablet   Oral   Take 75 mg by mouth daily.          . ferrous sulfate 325 (65 FE) MG tablet   Oral   Take 325 mg by mouth daily with breakfast.          . furosemide (LASIX) 40 MG tablet   Oral   Take 40 mg by mouth daily.         Marland Kitchen HYDROcodone-acetaminophen (NORCO/VICODIN) 5-325 MG per tablet   Oral   Take 1 tablet by mouth every 6 (six) hours as needed for moderate pain.         Marland Kitchen insulin aspart (NOVOLOG) 100 UNIT/ML injection   Subcutaneous   Inject 4-16 Units into the skin 3 (three) times daily with meals. Sliding scale CBG 100-150; 4 units, 151-200; 6 units, 201-250; 8 units, 251-300; 10 units, 301-350; 12 units, 351-400; 14 units, > 401; call doctor         . insulin glargine (LANTUS) 100 UNIT/ML injection   Subcutaneous   Inject 12 Units into the skin 2 (two) times daily.          . methotrexate (RHEUMATREX) 2.5 MG tablet   Oral   Take 20 mg by mouth every Wednesday.  Caution:Chemotherapy. Protect from light. Takes on Wednesdays.         . metoCLOPramide (REGLAN) 5 MG tablet   Oral   Take 2  tablets (10 mg total) by mouth 4 (four) times daily -  before meals and at bedtime.   120 tablet   1   . metoprolol (LOPRESSOR) 50 MG tablet   Oral   Take 50 mg by mouth 2 (two) times daily.         . nitroGLYCERIN (NITROSTAT) 0.4 MG SL tablet   Sublingual   Place 0.4 mg under the tongue every 5 (five) minutes as needed for chest pain.          Marland Kitchen ondansetron (ZOFRAN) 4 MG tablet   Oral   Take 4 mg by mouth every 6 (six) hours as needed for nausea.         . pantoprazole (PROTONIX) 40 MG tablet   Oral   Take 40 mg by mouth 2 (two) times daily.         . sertraline (ZOLOFT) 100 MG tablet   Oral   Take 100 mg by mouth daily.          . sodium-potassium bicarbonate (ALKA-SELTZER GOLD) TBEF dissolvable tablet   Oral   Take 1 tablet by mouth 2 (two) times daily as needed (indigestion).          . traMADol (ULTRAM) 50 MG tablet   Oral   Take 50 mg by mouth every 6 (six) hours as needed for pain.           BP 104/48  Pulse 77  Temp(Src) 97.8 F (36.6 C) (Oral)  Resp 16  SpO2 100%  LMP 05/13/2013 Physical Exam  Nursing note and vitals reviewed. Constitutional: She is oriented to person, place, and time. She appears well-developed and well-nourished. No distress.  HENT:  Head: Normocephalic.  Eyes: Conjunctivae are normal.  Eye patch over left eye from recent surgery  Cardiovascular: Normal rate and regular rhythm.   Pulmonary/Chest: Effort normal and breath sounds normal. No respiratory distress. She has no wheezes. She has no rales. She exhibits tenderness.    Patient with significant reproducible tenderness over the anterior right chest and upper breast. There is no bruising or deformities. No crepitus.  Abdominal: Soft. There is tenderness in the right upper quadrant, epigastric area and left upper quadrant. There is no rebound,  no guarding, no CVA tenderness and negative Murphy's sign.  Musculoskeletal: Normal range of motion. She exhibits no edema and no tenderness.  Neurological: She is alert and oriented to person, place, and time.  Skin: Skin is warm and dry. No rash noted.  Psychiatric: She has a normal mood and affect. Her behavior is normal.    ED Course  Procedures   DIAGNOSTIC STUDIES: Oxygen Saturation is 100% on room air.    COORDINATION OF CARE:  Nursing notes reviewed. Vital signs reviewed. Initial pt interview and examination performed.   10:00 PM patient seen and evaluated. Patient was discussed in sign out at 8 PM. Patient is a very hard IV and blood draw stick. Blood is planning to be taken arterially.  Patient with atypical right chest wall pains, there were slight T wave changes on EKG discussed with cardiologist. Plans are to evaluate labs and patient condition.   Patient continues to be pain-free at rest. She does have reproducible pain to palpation over the right upper chest wall. She also has some diffuse tenderness across upper abdomen. X-ray unremarkable. First troponin negative.  Patient was seen and evaluated with Dr. Bebe Shaggy.  Given patient's abdominal tenderness and abnormal lab findings we strongly encouraged that she receive an  IV in the CAT scan. Patient took time to think about this and did not wish to have any further testing. We discussed possibilities of a concerning cause of her abdominal pain that may lead to death, disability or disfigurement and patient still does not wish to receive any treatments or further testing. Patient was advised that she may return at any time if she changes her mind and strongly encouraged to return if symptoms change or worsen. She will be signed out AMA.  Treatment plan initiated: Medications  morphine 4 MG/ML injection 4 mg (4 mg Intramuscular Given 07/31/13 2024)   Results for orders placed during the hospital encounter of 07/31/13  CBC       Result Value Range   WBC 13.5 (*) 4.0 - 10.5 K/uL   RBC 3.98  3.87 - 5.11 MIL/uL   Hemoglobin 12.9  12.0 - 15.0 g/dL   HCT 81.8  29.9 - 37.1 %   MCV 94.0  78.0 - 100.0 fL   MCH 32.4  26.0 - 34.0 pg   MCHC 34.5  30.0 - 36.0 g/dL   RDW 69.6 (*) 78.9 - 38.1 %   Platelets 279  150 - 400 K/uL  GLUCOSE, CAPILLARY      Result Value Range   Glucose-Capillary 86  70 - 99 mg/dL  GLUCOSE, CAPILLARY      Result Value Range   Glucose-Capillary 110 (*) 70 - 99 mg/dL  GLUCOSE, CAPILLARY      Result Value Range   Glucose-Capillary 182 (*) 70 - 99 mg/dL  POCT I-STAT TROPONIN I      Result Value Range   Troponin i, poc 0.01  0.00 - 0.08 ng/mL   Comment 3               Imaging Review Dg Chest 2 View  07/31/2013   CLINICAL DATA:  History of myocardial infarction, chest pain  EXAM: CHEST  2 VIEW  COMPARISON:  01/04/2013  FINDINGS: The heart size and mediastinal contours are within normal limits. Both lungs are clear. The visualized skeletal structures are unremarkable. Right coronary artery stent noted.  IMPRESSION: No active cardiopulmonary disease.   Electronically Signed   By: Esperanza Heir M.D.   On: 07/31/2013 19:21    EKG Interpretation    Date/Time:  Thursday July 31 2013 15:53:27 EST Ventricular Rate:  69 PR Interval:  154 QRS Duration: 90 QT Interval:  402 QTC Calculation: 430 R Axis:   113 Text Interpretation:  Normal sinus rhythm Left posterior fascicular block Inferior infarct , age undetermined Anterior infarct , age undetermined ST \\T \ T wave abnormality, consider lateral ischemia Abnormal ECG Since previous tracing t wave changes lateral leads Confirmed by Karma Ganja  MD, MARTHA [phone]) on 07/31/2013 8:17:41 PM            MDM   1. Abdominal pain   2. Chest pain   3. Dehydration   4. Abnormal EKG        Angus Seller, PA-C 08/01/13 0008

## 2013-07-31 NOTE — ED Notes (Signed)
IV team paged. Pt continues to refuse blood draws and IV attempts pt reports she would only allow people to stick her in the foot.

## 2013-07-31 NOTE — ED Provider Notes (Signed)
I was asked to evaluate patient with PA Dammen after he received signout Pt with multiple complaints, however she tells me that her main issue is abdominal pain and diarrhea She is in no distress but she does have significant lower abdominal tenderness and significant dehydration noted (bicarb 13) I advised IV fluids and CT imaging of abdomen/pelvis She also had EKG changes from previous which need to be explored further     Joya Gaskins, MD 07/31/13 2348

## 2013-07-31 NOTE — ED Notes (Signed)
Pt reports she sneezed this afternoon about and felt pulling and squeezing in her chest. Pt with hx of stents. Pt reports SOB with the pain. Denies diaphoresis. States pain radiates to jaw. Just had eye surgery here yesterday. Pt weak, lethargic appearance. Took 1 nitro PTA.

## 2013-07-31 NOTE — ED Provider Notes (Signed)
CSN: [id number]     Arrival date & time 07/31/13  1547 History   First MD Initiated Contact with Patient 07/31/13 1645     Chief Complaint  Patient presents with  . Chest Pain   (Consider location/radiation/quality/duration/timing/severity/associated sxs/prior Treatment) HPI Comments: [name] [name] is a 48 year-old female with a past medical history of MI, s/p 2 stents , presenting the Emergency Department with a chief complaint of right sided chest pain since 1500.  The patient reports she sneezed today and immediately felt "tugging" in her right chest. The patient states she took a nitro and zofran for her nausea. She reports the discomfort lasted for 30 minutes. She reports partial relief with nitro.  She reports she is taking Plavix, stopped for two days for her eye surgery and restarted today.  She denies abdominal pain, reports "feels like I have diarrhea", no palpitations, fever, or chills.  Cardiologist: Corky Crafts.  Patient is a 48 y.o. female presenting with chest pain. The history is provided by the patient and medical records. No language interpreter was used.  Chest Pain Associated symptoms: cough, nausea and shortness of breath   Associated symptoms: no abdominal pain, no fever and not vomiting     Past Medical History  Diagnosis Date  . Hypertension   . Sebaceous cyst   . Anemia   . Allergic rhinitis   . Hypertension   . Diabetic retinopathy   . Acute osteomyelitis   . Esophageal ulcer   . Esophageal stenosis   . Gastroparesis   . Renal insufficiency   . GERD (gastroesophageal reflux disease)   . Hyperlipidemia   . Depression   . Anxiety   . Polysubstance abuse   . Esophagitis   . CAD (coronary artery disease) 12/12    s/p DES mid and distal RCA with 50% LAD  . Heart murmur   . Pneumonia 2012  . Orthopnoea   . Type II diabetes mellitus   . Inferior MI 01/20/2009    /notes on 12/19/2012, "that's the only one I've had" (12/19/2012)  . Acute myocardial  infarction, unspecified site, initial episode of care   . Acute myocardial infarction of other lateral wall, initial episode of care   . Rheumatoid arthritis(714.0)   . Bipolar affective   . History of stomach ulcers   . Anginal pain     07/15/13- no chest pain in months"  . Stroke 2011    denies residual on 12/19/2012.  "Years ago"  . Daily headache     not daily  . Migraines    Past Surgical History  Procedure Laterality Date  . Irrigation and debridement sebaceous cyst Right 03/2011    "pointer" (12/19/2012)  . Coronary angioplasty with stent placement  01/20/2009    "2" (12/19/2012)  . Coronary angioplasty with stent placement  2012    "2" (12/19/2012)  . Coronary angioplasty with stent placement  12/19/2012    "2" (12/19/2012)  . Pars plana vitrectomy Left 07/16/2013    Procedure: PARS PLANA VITRECTOMY WITH 23 GAUGE;  Surgeon: Shade Flood, MD;  Location: [facility] OR;  Service: Ophthalmology;  Laterality: Left;  Marland Kitchen Membrane peel Left 07/16/2013    Procedure: MEMBRANE PEEL;  Surgeon: Shade Flood, MD;  Location: [facility] OR;  Service: Ophthalmology;  Laterality: Left;  . Photocoagulation with laser Left 07/16/2013    Procedure: PHOTOCOAGULATION WITH LASER;  Surgeon: Shade Flood, MD;  Location: [facility] OR;  Service: Ophthalmology;  Laterality: Left;  ENDOLASER  . Gas insertion Left 07/16/2013  Procedure: INSERTION OF GAS;  Surgeon: Shade Flood, MD;  Location: [facility] OR;  Service: Ophthalmology;  Laterality: Left;  SF6   Family History  Problem Relation Age of Onset  . Breast cancer Mother 11  . Hypertension Father   . Stomach cancer Sister   . Hypertension Sister   . Colon cancer Neg Hx    History  Substance Use Topics  . Smoking status: Current Every Day Smoker -- 0.50 packs/day for 22 years    Types: Cigarettes  . Smokeless tobacco: Never Used  . Alcohol Use: Yes     Comment: 12/31/2012 "1 cooler/wk since mother's day; none since last week; clean 6 years before that"   OB History   Grav  Para Term Preterm Abortions TAB SAB Ect Mult Living                 Review of Systems  Constitutional: Negative for fever and chills.  HENT: Positive for sneezing.   Respiratory: Positive for cough, chest tightness and shortness of breath.   Cardiovascular: Positive for chest pain.  Gastrointestinal: Positive for nausea. Negative for vomiting, abdominal pain, diarrhea and constipation.    Allergies  Aspirin and Codeine  Home Medications   Current Outpatient Rx  Name  Route  Sig  Dispense  Refill  . acetaminophen (TYLENOL) 500 MG tablet   Oral   Take 500 mg by mouth every 6 (six) hours as needed for moderate pain.          . brinzolamide (AZOPT) 1 % ophthalmic suspension   Right Eye   Place 1 drop into the right eye 3 (three) times daily.         . clopidogrel (PLAVIX) 75 MG tablet   Oral   Take 75 mg by mouth daily.          . ferrous sulfate 325 (65 FE) MG tablet   Oral   Take 325 mg by mouth daily with breakfast.          . furosemide (LASIX) 40 MG tablet   Oral   Take 40 mg by mouth daily.         Marland Kitchen HYDROcodone-acetaminophen (NORCO/VICODIN) 5-325 MG per tablet   Oral   Take 1 tablet by mouth every 6 (six) hours as needed for moderate pain.         Marland Kitchen insulin aspart (NOVOLOG) 100 UNIT/ML injection   Subcutaneous   Inject 4-16 Units into the skin 3 (three) times daily with meals. Sliding scale CBG 100-150; 4 units, 151-200; 6 units, 201-250; 8 units, 251-300; 10 units, 301-350; 12 units, 351-400; 14 units, > 401; call doctor         . insulin glargine (LANTUS) 100 UNIT/ML injection   Subcutaneous   Inject 12 Units into the skin 2 (two) times daily.          . methotrexate (RHEUMATREX) 2.5 MG tablet   Oral   Take 20 mg by mouth every Wednesday. Caution:Chemotherapy. Protect from light. Takes on Wednesdays.         . metoCLOPramide (REGLAN) 5 MG tablet   Oral   Take 2 tablets (10 mg total) by mouth 4 (four) times daily -  before meals and at  bedtime.   120 tablet   1   . metoprolol (LOPRESSOR) 50 MG tablet   Oral   Take 50 mg by mouth 2 (two) times daily.         . nitroGLYCERIN (NITROSTAT) 0.4 MG SL  tablet   Sublingual   Place 0.4 mg under the tongue every 5 (five) minutes as needed for chest pain.          Marland Kitchen ondansetron (ZOFRAN) 4 MG tablet   Oral   Take 4 mg by mouth every 6 (six) hours as needed for nausea.         . pantoprazole (PROTONIX) 40 MG tablet   Oral   Take 40 mg by mouth 2 (two) times daily.         . sertraline (ZOLOFT) 100 MG tablet   Oral   Take 100 mg by mouth daily.          . sodium-potassium bicarbonate (ALKA-SELTZER GOLD) TBEF dissolvable tablet   Oral   Take 1 tablet by mouth 2 (two) times daily as needed (indigestion).          . traMADol (ULTRAM) 50 MG tablet   Oral   Take 50 mg by mouth every 6 (six) hours as needed for pain.           BP 106/55  Pulse 84  Temp(Src) 97.8 F (36.6 C) (Oral)  Resp 23  SpO2 97%  LMP 05/13/2013 Physical Exam  Nursing note and vitals reviewed. Constitutional: She appears well-developed and well-nourished. No distress.  HENT:  Head: Normocephalic and atraumatic.  Nose: Epistaxis is observed.  Left eye covered by patch. Left epistaxis, small lesion at the anterior nare.  Neck: Neck supple.  Cardiovascular: Normal rate and regular rhythm.   No lower extremity edema  Pulmonary/Chest: Effort normal and breath sounds normal. No respiratory distress. She has no wheezes. She has no rales. She exhibits no tenderness.  Abdominal: Soft. Bowel sounds are normal. There is no tenderness. There is no rebound and no guarding.  Musculoskeletal: Normal range of motion.  Neurological: She is alert.  Skin: Skin is warm. She is not diaphoretic.  Psychiatric: She has a normal mood and affect. Her behavior is normal.    ED Course  Procedures (including critical care time) Labs Review Labs Reviewed  GLUCOSE, CAPILLARY - Abnormal; Notable for the  following:    Glucose-Capillary 110 (*)    All other components within normal limits  GLUCOSE, CAPILLARY  CBC  BASIC METABOLIC PANEL   Imaging Review Dg Chest 2 View  07/31/2013   CLINICAL DATA:  History of myocardial infarction, chest pain  EXAM: CHEST  2 VIEW  COMPARISON:  01/04/2013  FINDINGS: The heart size and mediastinal contours are within normal limits. Both lungs are clear. The visualized skeletal structures are unremarkable. Right coronary artery stent noted.  IMPRESSION: No active cardiopulmonary disease.   Electronically Signed   By: Esperanza Heir M.D.   On: 07/31/2013 19:21    EKG Interpretation    Date/Time:  Thursday July 31 2013 15:53:27 EST Ventricular Rate:  69 PR Interval:  154 QRS Duration: 90 QT Interval:  402 QTC Calculation: 430 R Axis:   113 Text Interpretation:  Normal sinus rhythm Left posterior fascicular block Inferior infarct , age undetermined Anterior infarct , age undetermined ST \\T \ T wave abnormality, consider lateral ischemia Abnormal ECG Since previous tracing t wave changes lateral leads Confirmed by Karma Ganja  MD, MARTHA [phone]) on 07/31/2013 8:17:41 PM            MDM  Chest pain Pt with a history of two previous stenting presents with Right sided chest pain. EKG with T-wave inversion in lateral leads. Phlebotomy was unable to draw labs due to the  patient refusing an IV in the upper extremity. Discussed patient history, condition Dr. Karma Ganja who advised ABG for blood sample. Discussed patient history and condition with Ivonne Andrew, PA-C who will assume care of the patient.      Clabe Seal, PA-C 08/02/13 1655

## 2013-08-01 ENCOUNTER — Telehealth: Payer: Self-pay | Admitting: Interventional Cardiology

## 2013-08-01 LAB — HEPATIC FUNCTION PANEL
ALT: 5 U/L (ref 0–35)
AST: 10 U/L (ref 0–37)
Albumin: 3.6 g/dL (ref 3.5–5.2)
Alkaline Phosphatase: 122 U/L — ABNORMAL HIGH (ref 39–117)
Bilirubin, Direct: <0.2 mg/dL (ref 0.0–0.3)
TOTAL PROTEIN: 7.8 g/dL (ref 6.0–8.3)
Total Bilirubin: 0.4 mg/dL (ref 0.3–1.2)

## 2013-08-01 NOTE — Telephone Encounter (Signed)
New message     Pt eye dr wants her to stop her plavix for an upcoming procedure is this ok

## 2013-08-01 NOTE — ED Provider Notes (Signed)
Medical screening examination/treatment/procedure(s) were conducted as a shared visit with non-physician practitioner(s) and myself.  I personally evaluated the patient during the encounter.  EKG Interpretation    Date/Time:  Thursday July 31 2013 15:53:27 EST Ventricular Rate:  69 PR Interval:  154 QRS Duration: 90 QT Interval:  402 QTC Calculation: 430 R Axis:   113 Text Interpretation:  Normal sinus rhythm Left posterior fascicular block Inferior infarct , age undetermined Anterior infarct , age undetermined ST \\T \ T wave abnormality, consider lateral ischemia Abnormal ECG Since previous tracing t wave changes lateral leads Confirmed by Karma Ganja  MD, MARTHA [phone]) on 07/31/2013 8:17:41 PM              Joya Gaskins, MD 08/01/13 [phone]

## 2013-08-01 NOTE — Telephone Encounter (Signed)
To Dr. Varanasi, please advise.  

## 2013-08-02 NOTE — ED Provider Notes (Signed)
Medical screening examination/treatment/procedure(s) were performed by non-physician practitioner and as supervising physician I was immediately available for consultation/collaboration.  EKG Interpretation    Date/Time:  Thursday July 31 2013 15:53:27 EST Ventricular Rate:  69 PR Interval:  154 QRS Duration: 90 QT Interval:  402 QTC Calculation: 430 R Axis:   113 Text Interpretation:  Normal sinus rhythm Left posterior fascicular block Inferior infarct , age undetermined Anterior infarct , age undetermined ST \\T \ T wave abnormality, consider lateral ischemia Abnormal ECG Since previous tracing t wave changes lateral leads Confirmed by Karma Ganja  MD, Indra Wolters [phone]) on 07/31/2013 8:17:41 PM             Ethelda Chick, MD 08/02/13 1701

## 2013-08-04 ENCOUNTER — Encounter (HOSPITAL_COMMUNITY): Payer: Self-pay | Admitting: Ophthalmology

## 2013-08-04 NOTE — Telephone Encounter (Signed)
Last stent was placed in 5/14.  Ideally, she would not stop plavix until 6/14.  If surgery is urgent, and cannot wait, ok to stop plavix at this point since it has been over 6 months,

## 2013-08-05 NOTE — Telephone Encounter (Signed)
Pt already had eye surgery in December (twice). Pt stopped plavix for those procedures. Pt just held plavix on the day of the procedure and when she went back on 07/31/13 and they told her to stop plavix. Pt hasnt taken plavix since then, because her eye was bleeding.  Pt will be going back to eye doctor tomorrow and see if they will let her go back on the plavix.

## 2013-08-06 IMAGING — CR DG CHEST 2V
2 series · 2 of 2 positions shown · non-contrast
Comparison: Chest x-ray 12/29/2012.

CLINICAL DATA: Chest pain.  Nausea and dizziness.

CHEST - 2 VIEW

[w chest pa]
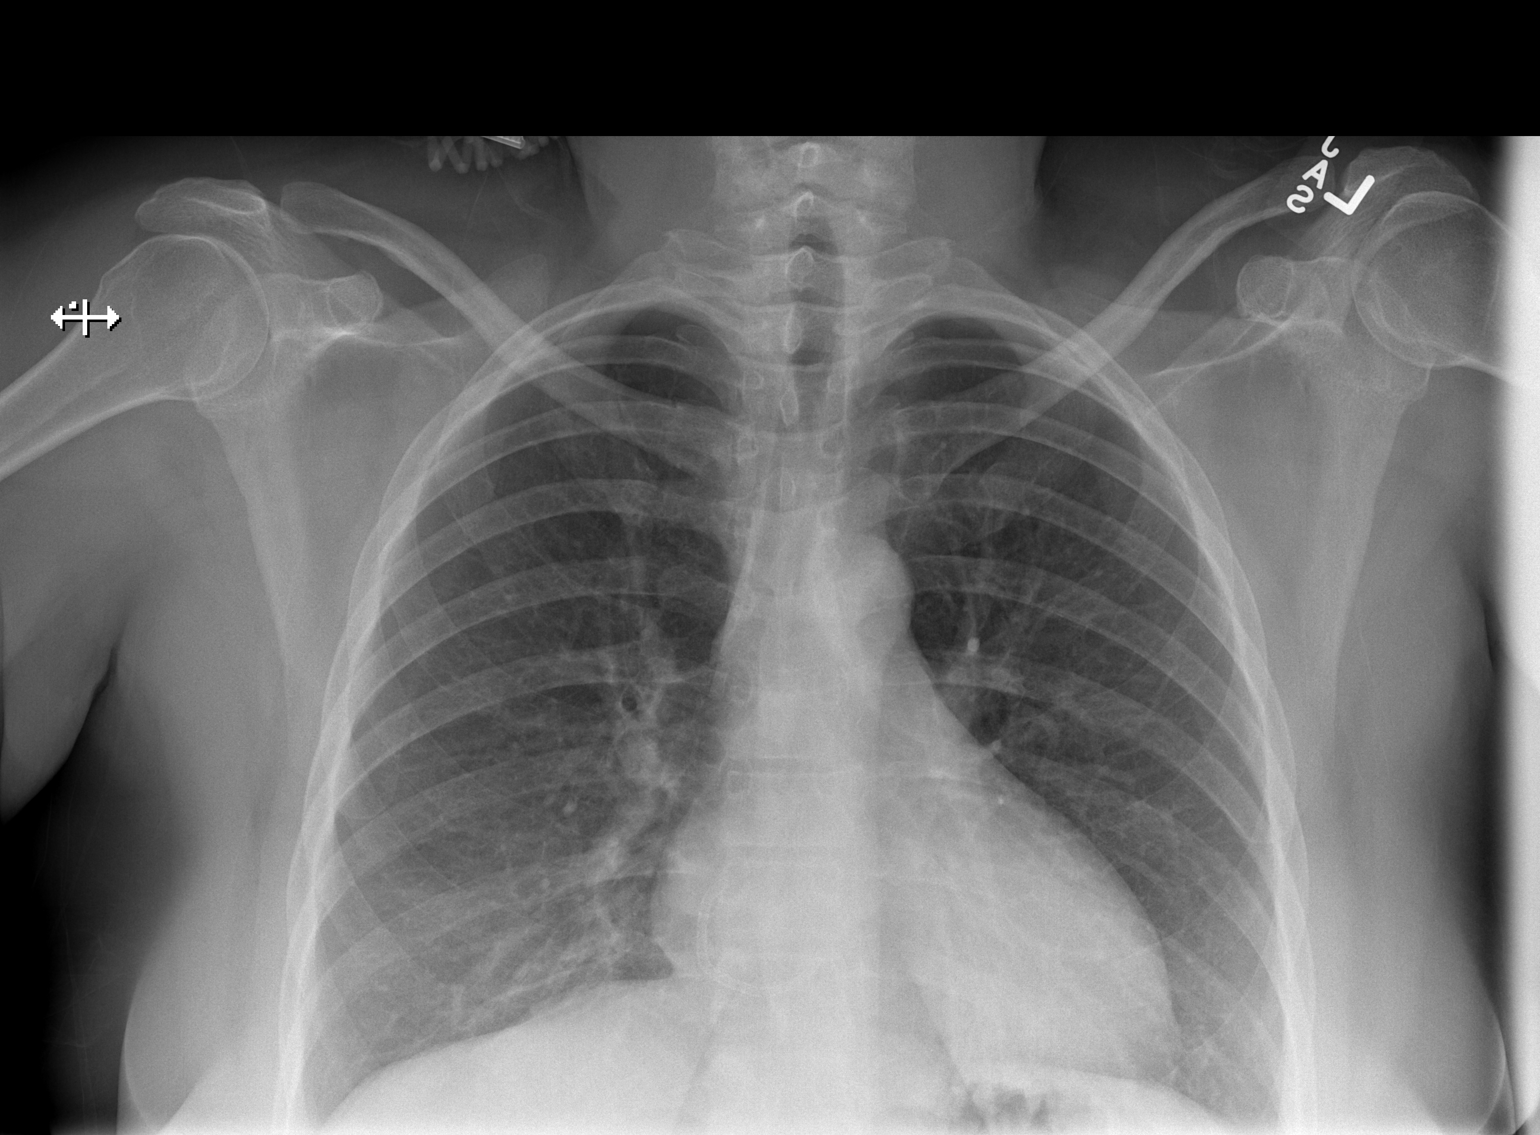

[w chest lat]
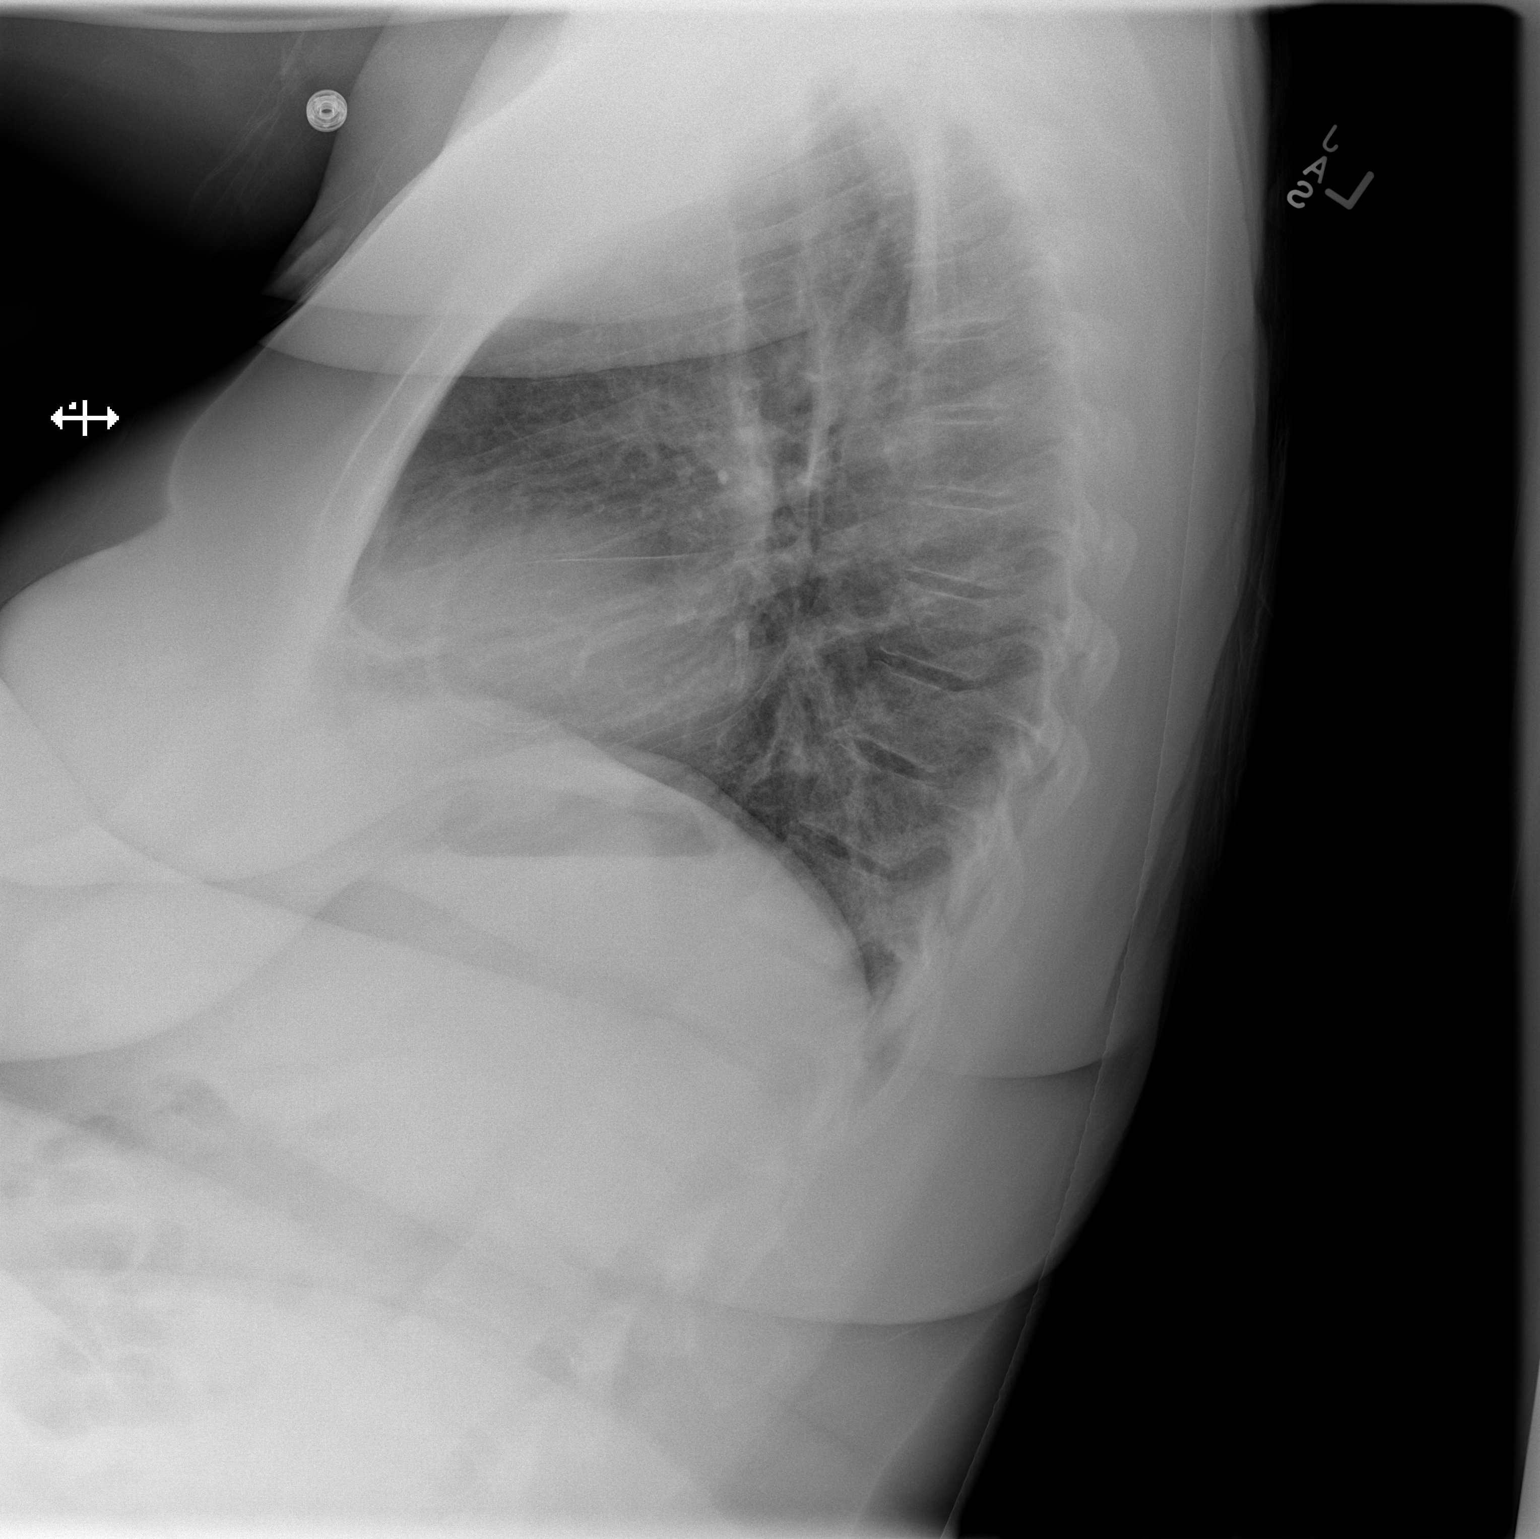

[2 of 2 positions shown; findings below may reference images not displayed]

FINDINGS: Lung volumes are normal.  No consolidative airspace
disease.  No pleural effusions.  No pneumothorax.  No pulmonary
nodule or mass noted.  Pulmonary vasculature and the
cardiomediastinal silhouette are within normal limits.  Coronary
artery stent in the right coronary artery.
IMPRESSION: 1. No radiographic evidence of acute cardiopulmonary disease.
2.  Right coronary artery stent.

## 2013-08-12 NOTE — Telephone Encounter (Signed)
Can she go back on Plavix?

## 2013-08-13 NOTE — Telephone Encounter (Signed)
Pt is going to start back taking plavix on 08/15/13 per her eye doctor.

## 2013-08-21 ENCOUNTER — Telehealth: Payer: Self-pay | Admitting: Interventional Cardiology

## 2013-08-21 NOTE — Telephone Encounter (Signed)
New message     Saw eye dr today--he want her to stop plavix because it is causing her eye to bleed.  It there something else she can take?

## 2013-08-21 NOTE — Telephone Encounter (Signed)
To Dr. Varanasi. 

## 2013-08-25 NOTE — Telephone Encounter (Signed)
[name] [name], is there another option other than plavix for pt to take? See last few notes.

## 2013-08-25 NOTE — Telephone Encounter (Signed)
Looks like DES placed 11/2012.  She's had a h/o GI bleed on aspirin, and even if she were to use aspirin with a PPI, there's likely going to be a similar risk of bleeding into her eye with aspirin vs plavix.  Effient and Brilinta will also be a  Greater than or equal bleed risk than Plavix.  I don't think there is another medication option unless surgical intervention is done to prevent future bleeding in the eye.

## 2013-08-25 NOTE — Telephone Encounter (Signed)
Pt has already had 2 surgeries. Pt was told that the plavix is the reason she had bleeding. Pt is back on plavix even though her eye doctor told her to go off the plavix for the bleeding. Pt cant see out of her left eye because of the bleeding. Pt will go back to see her eye doctor on 08/29/13 and she may need to go off the plavix for 1-2 weeks to see if the bleeding stops or she may switch to another eye doctor.

## 2013-08-26 ENCOUNTER — Other Ambulatory Visit: Payer: Self-pay | Admitting: Nurse Practitioner

## 2013-08-28 ENCOUNTER — Other Ambulatory Visit: Payer: Self-pay

## 2013-08-28 DIAGNOSIS — Z1231 Encounter for screening mammogram for malignant neoplasm of breast: Secondary | ICD-10-CM

## 2013-08-30 NOTE — Telephone Encounter (Signed)
Stop plavix given significant issues with her eye.

## 2013-09-01 NOTE — Addendum Note (Signed)
Addended byOrlene Plum H on: 09/01/2013 11:20 AM   Modules accepted: Orders, Medications

## 2013-09-01 NOTE — Telephone Encounter (Signed)
Pt notified. Meds updated.  

## 2013-09-08 IMAGING — US US ABDOMEN LIMITED
1 series · 14 of 25 positions shown · non-contrast
Comparison: CT abdomen pelvis of 02/11/2011

CLINICAL DATA: Nausea, vomiting, smoking history, diabetes

LIMITED ABDOMINAL ULTRASOUND - RIGHT UPPER QUADRANT

[Series 1: us abdomen limited · 0.30mm/px · 14 of 41 slices shown]
[im 1/41]
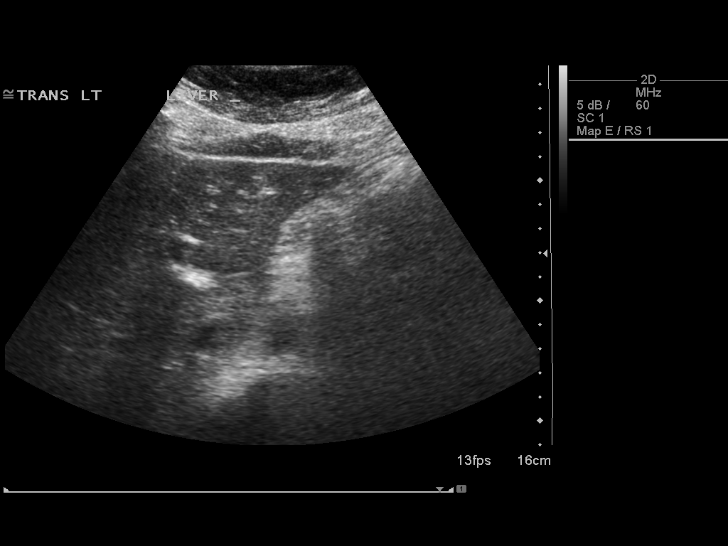
[im 4/41]
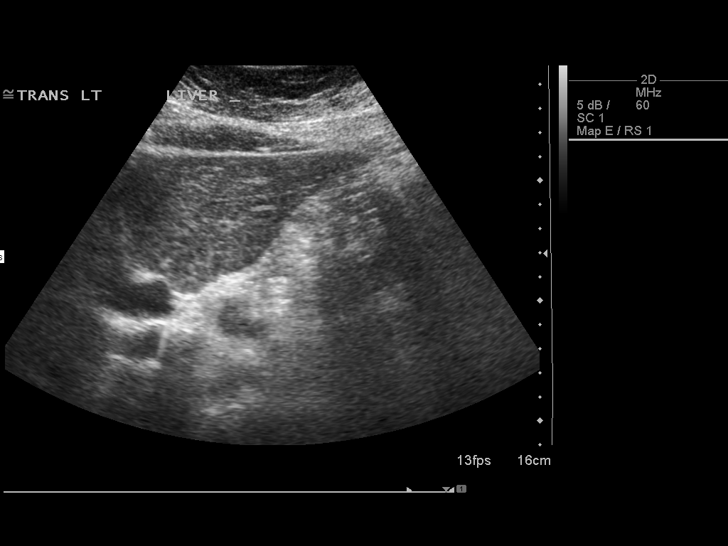
[im 7/41]
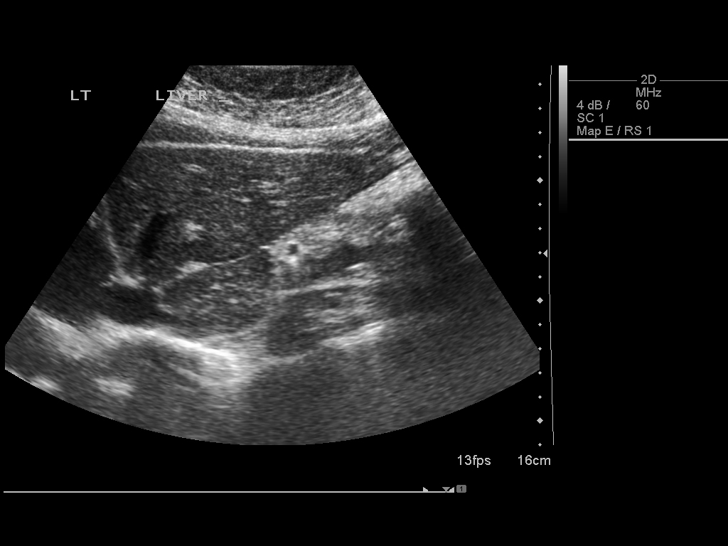
[im 11/41]
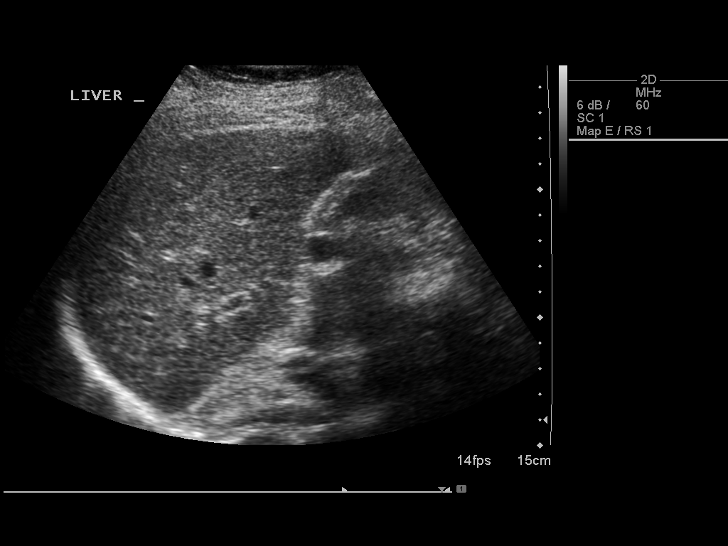
[im 14/41]
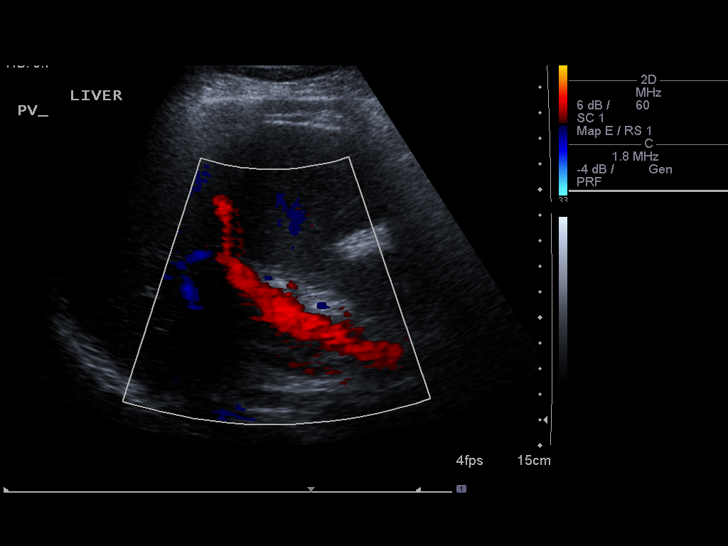
[im 16/41]
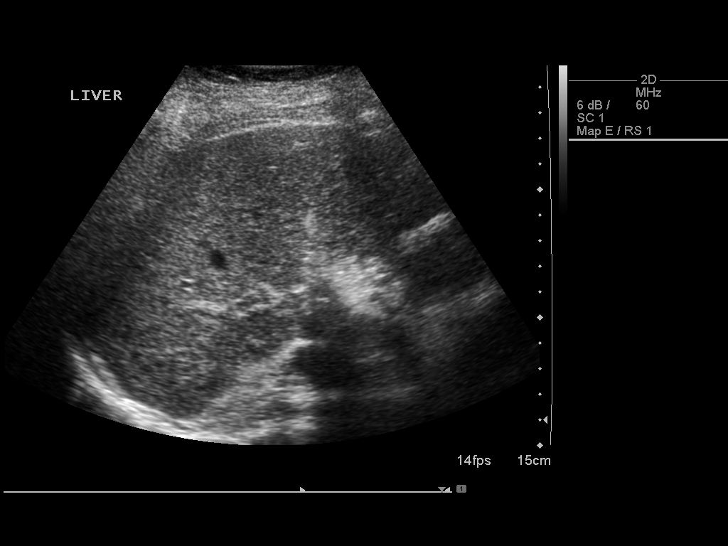
[im 19/41]
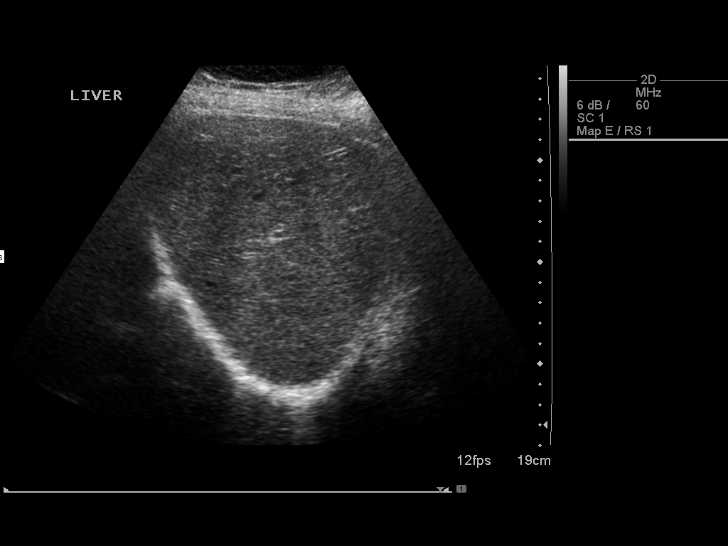
[im 22/41]
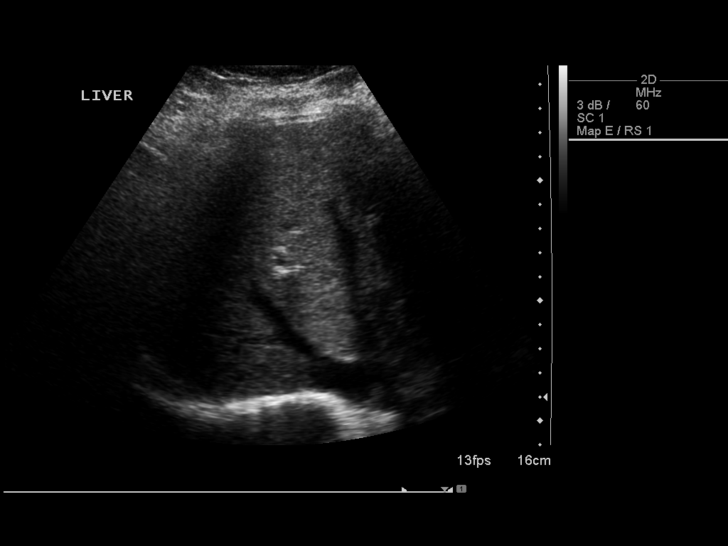
[im 26/41]
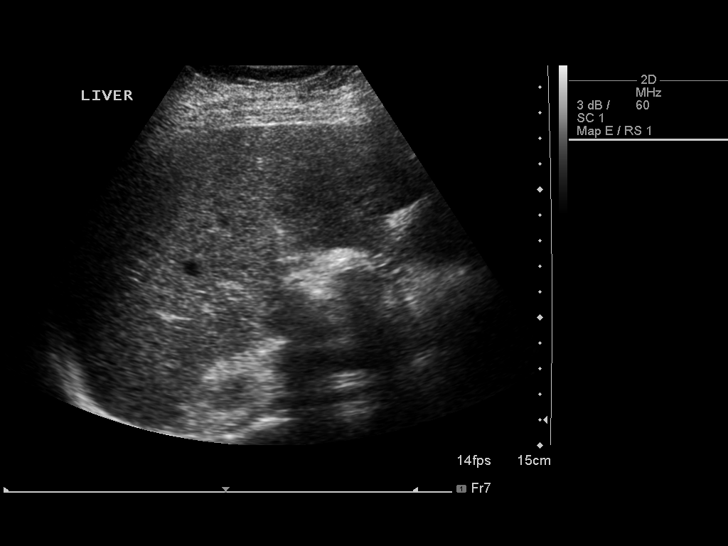
[im 27/41]
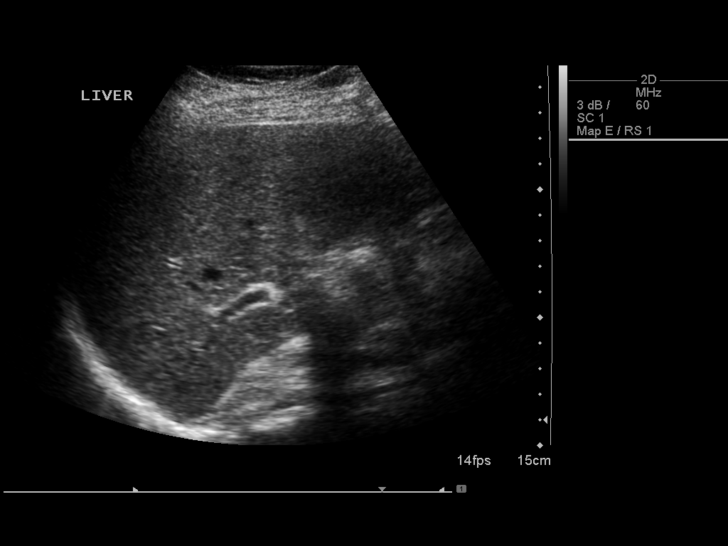
[im 31/41]
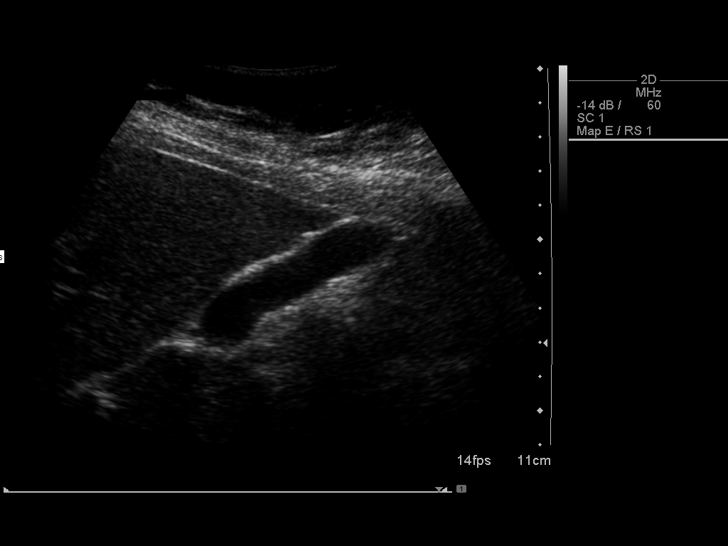
[im 34/41]
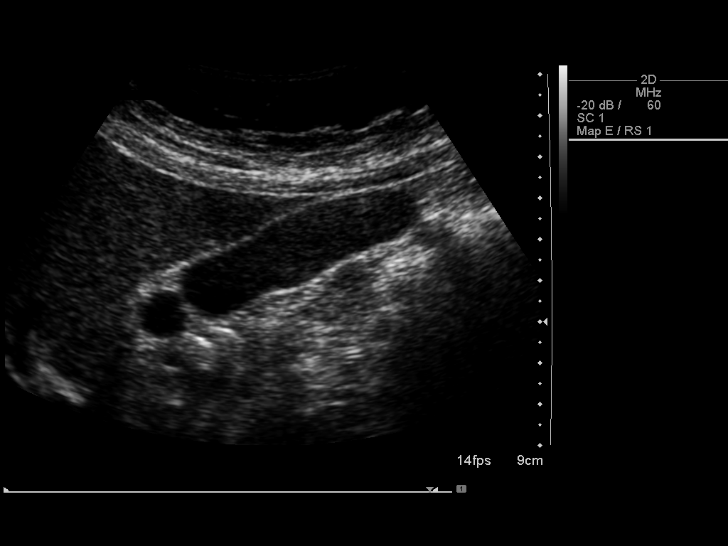
[im 37/41]
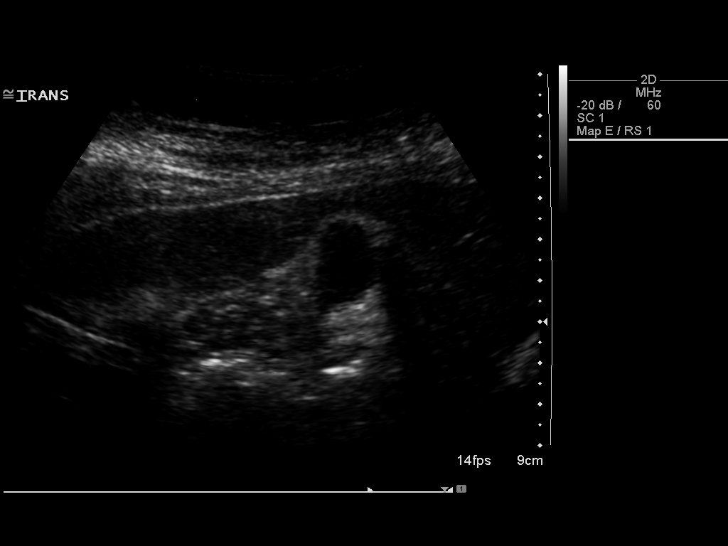
[im 41/41]
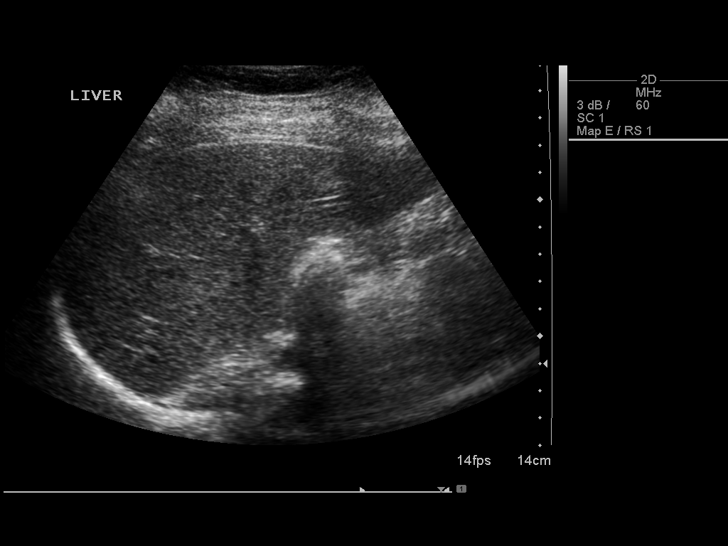

[14 of 25 positions shown; findings below may reference images not displayed]

FINDINGS: Gallbladder:  The gallbladder is visualized and no gallstones are
noted.  There is no pain over the gallbladder with compression.

Common bile duct:  The common bile duct is normal measuring 1.9 mm
in diameter.

Liver:  The liver has a normal echogenic pattern.  No focal
abnormality is seen.
IMPRESSION: Negative limited ultrasound of the right upper quadrant.

## 2013-10-02 DIAGNOSIS — H3342 Traction detachment of retina, left eye: Secondary | ICD-10-CM | POA: Insufficient documentation

## 2013-10-02 DIAGNOSIS — H53009 Unspecified amblyopia, unspecified eye: Secondary | ICD-10-CM | POA: Insufficient documentation

## 2013-10-02 DIAGNOSIS — H179 Unspecified corneal scar and opacity: Secondary | ICD-10-CM | POA: Insufficient documentation

## 2013-10-03 ENCOUNTER — Encounter: Payer: Self-pay | Admitting: Interventional Cardiology

## 2013-10-03 ENCOUNTER — Ambulatory Visit (INDEPENDENT_AMBULATORY_CARE_PROVIDER_SITE_OTHER): Payer: Medicaid Other | Admitting: Interventional Cardiology

## 2013-10-03 ENCOUNTER — Encounter: Payer: Self-pay | Admitting: Cardiology

## 2013-10-03 VITALS — BP 102/70 | HR 83 | Ht 61.0 in | Wt 187.0 lb

## 2013-10-03 DIAGNOSIS — E785 Hyperlipidemia, unspecified: Secondary | ICD-10-CM

## 2013-10-03 DIAGNOSIS — I10 Essential (primary) hypertension: Secondary | ICD-10-CM

## 2013-10-03 DIAGNOSIS — Z720 Tobacco use: Secondary | ICD-10-CM | POA: Insufficient documentation

## 2013-10-03 DIAGNOSIS — I2510 Atherosclerotic heart disease of native coronary artery without angina pectoris: Secondary | ICD-10-CM

## 2013-10-03 DIAGNOSIS — F17200 Nicotine dependence, unspecified, uncomplicated: Secondary | ICD-10-CM | POA: Insufficient documentation

## 2013-10-03 NOTE — Progress Notes (Signed)
Patient ID: [name] [name], female   DOB: [birth year], 48 y.o.   MRN: [id number]    [address] [place], [place]  [address] Phone: [phone] Fax:  [phone]  Date:  10/03/2013   ID:  Nigel Berthold, DOB 1966/03/04, MRN [id number]  PCP:  Lonia Blood, MD      History of Present Illness: [name] [name] is a 48 y.o. female -year-old woman with history of inferior MI several years ago. She had a non-STEMI in May of 2014 and had a stent placed in her circumflex. This was a drug-eluting stent. She has done well since that time. She denies any recent chest discomfort or shortness of breath. She had Zoeller surgery in January unfortunately, had bleeding problems after this. Her vision in her left eye is severely decreased. She may need more surgery. She has not been taking aspirin because she is allergic. She had been on only clopidogrel. We stopped this medication several weeks ago since it had been more than 6 months since her last stent. She is wondering if she can come off of the clopidogrel Indefinitely.   Wt Readings from Last 3 Encounters:  10/03/13 187 lb (84.823 kg)  07/15/13 188 lb (85.276 kg)  07/12/13 185 lb (83.915 kg)     Past Medical History  Diagnosis Date  . Hypertension   . Sebaceous cyst   . Anemia   . Allergic rhinitis   . Hypertension   . Diabetic retinopathy   . Acute osteomyelitis   . Esophageal ulcer   . Esophageal stenosis   . Gastroparesis   . Renal insufficiency   . GERD (gastroesophageal reflux disease)   . Hyperlipidemia   . Depression   . Anxiety   . Polysubstance abuse   . Esophagitis   . CAD (coronary artery disease) 12/12    s/p DES mid and distal RCA with 50% LAD  . Heart murmur   . Pneumonia 2012  . Orthopnoea   . Type II diabetes mellitus   . Inferior MI 01/20/2009    /notes on 12/19/2012, "that's the only one I've had" (12/19/2012)  . Acute myocardial infarction, unspecified site, initial episode of care   . Acute myocardial infarction of other  lateral wall, initial episode of care   . Rheumatoid arthritis(714.0)   . Bipolar affective   . History of stomach ulcers   . Anginal pain     07/15/13- no chest pain in months"  . Stroke 2011    denies residual on 12/19/2012.  "Years ago"  . Daily headache     not daily  . Migraines     Current Outpatient Prescriptions  Medication Sig Dispense Refill  . acetaminophen (TYLENOL) 500 MG tablet Take 500 mg by mouth every 6 (six) hours as needed for moderate pain.       . brinzolamide (AZOPT) 1 % ophthalmic suspension Place 1 drop into the right eye 3 (three) times daily.      . ferrous sulfate 325 (65 FE) MG tablet Take 325 mg by mouth daily with breakfast.       . furosemide (LASIX) 40 MG tablet Take 40 mg by mouth daily.      . insulin aspart (NOVOLOG) 100 UNIT/ML injection Inject 4-16 Units into the skin 3 (three) times daily with meals. Sliding scale CBG 100-150; 4 units, 151-200; 6 units, 201-250; 8 units, 251-300; 10 units, 301-350; 12 units, 351-400; 14 units, > 401; call doctor      . insulin  glargine (LANTUS) 100 UNIT/ML injection Inject 12 Units into the skin 2 (two) times daily.       . methotrexate (RHEUMATREX) 2.5 MG tablet Take 20 mg by mouth every Wednesday. Caution:Chemotherapy. Protect from light. Takes on Wednesdays.      . metoCLOPramide (REGLAN) 5 MG tablet Take 2 tablets (10 mg total) by mouth 4 (four) times daily -  before meals and at bedtime.  120 tablet  1  . metoprolol (LOPRESSOR) 50 MG tablet Take 50 mg by mouth 2 (two) times daily.      . nitroGLYCERIN (NITROSTAT) 0.4 MG SL tablet Place 0.4 mg under the tongue every 5 (five) minutes as needed for chest pain.       Marland Kitchen ondansetron (ZOFRAN) 4 MG tablet Take 4 mg by mouth every 6 (six) hours as needed for nausea.      . pantoprazole (PROTONIX) 40 MG tablet Take 40 mg by mouth 2 (two) times daily.      . sertraline (ZOLOFT) 100 MG tablet Take 100 mg by mouth daily.       . sodium-potassium bicarbonate (ALKA-SELTZER GOLD)  TBEF dissolvable tablet Take 1 tablet by mouth 2 (two) times daily as needed (indigestion).       . traMADol (ULTRAM) 50 MG tablet Take 50 mg by mouth every 6 (six) hours as needed for pain.        No current facility-administered medications for this visit.    Allergies:    Allergies  Allergen Reactions  . Aspirin Other (See Comments)    Stomach bleeds  . Codeine Hives and Itching    Social History:  The patient  reports that she has been smoking Cigarettes.  She has a 11 pack-year smoking history. She has never used smokeless tobacco. She reports that she drinks alcohol. She reports that she does not use illicit drugs.   Family History:  The patient's family history includes Breast cancer (age of onset: 85) in her mother; Hypertension in her father and sister; Stomach cancer in her sister. There is no history of Colon cancer.   ROS:  Please see the history of present illness.  No nausea, vomiting.  No fevers, chills.  No focal weakness.  No dysuria. Visual problems in her left eye   All other systems reviewed and negative.   PHYSICAL EXAM: VS:  BP 102/70  Pulse 83  Ht 5\' 1"  (1.549 m)  Wt 187 lb (84.823 kg)  BMI 35.35 kg/m2  SpO2 99% Well nourished, well developed, in no acute distress HEENT: normal Neck: no JVD, no carotid bruits Cardiac:  normal S1, S2; RRR;  Lungs:  clear to auscultation bilaterally, no wheezing, rhonchi or rales Abd: soft, obese Ext: no edema Skin: warm and dry Neuro:   no focal abnormalities noted     ASSESSMENT AND PLAN:  Coronary atherosclerosis of native coronary artery  Stopped Plavix Tablet, 75 MG, 1 tablet, Orally, Once a day per PCP Notes: No angina. Last stent was 5/14. Allergic to aspirin.    2. MI, Old   continue Metoprolol Tartrate Tablet, 25 MG, 1 tablet, Orally, Twice a day, 30 day(s), 60, Refills 11 Notes: No CHF. 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    3. Hypertension, essential  Continue Losartan  Potassium Tablet, 25 MG, 1/2 tablet, Orally, Once a day Notes: Controlled.     4. Tobacco dependence  Notes: She needs to stop smoking.    5. Mixed hyperlipidemia  Continue Atorvastatin Calcium  Tablet, 40 MG, 1 tablet, Orally, Once a day per PCP; she does require a lipid check. She is a very difficult stick and is typically stuck in the foot to get blood. Her last LDL was above target. She states that she continues to take her statin. This may need to be increased to 80 mg daily based on her next blood test result.    Preventive Medicine  Adult topics discussed:  Exercise: 5 days a week, at least 30 minutes of aerobic exercise.      Signed, Fredric Mare, MD, [facility] 10/03/2013 11:55 AM

## 2013-10-03 NOTE — Patient Instructions (Signed)
Your physician recommends that you continue on your current medications as directed. Please refer to the Current Medication list given to you today.  Your physician recommends that you return for a FASTING lipid profile: get your PCP to check.  Your physician wants you to follow-up in: 6 months with Dr. Eldridge Dace. You will receive a reminder letter in the mail two months in advance. If you don't receive a letter, please call our office to schedule the follow-up appointment.  It is very important that she quit smoking. There are various alternatives available to help with this difficult task, but first and foremost, she must make a firm commitment and decision to quit. The nature of nicotine addiction is discussed. The usefulness of behavioral therapy is discussed and suggested.  The correct use, cost and side effects of nicotine replacement therapy such as gum or patches is discussed. Bupropion and its cost (sometimes not covered fully by insurance) and side effects are reviewed. The quit rates are discussed. I recommend she not allow potential costs of treatment to deter her from using nicotine replacement therapy or bupropion, as the long term economic and health benefits are obvious.

## 2013-10-10 ENCOUNTER — Other Ambulatory Visit: Payer: Self-pay | Admitting: Cardiology

## 2013-10-10 ENCOUNTER — Ambulatory Visit: Payer: Medicaid Other | Admitting: *Deleted

## 2013-10-10 DIAGNOSIS — Z79899 Other long term (current) drug therapy: Secondary | ICD-10-CM

## 2013-10-10 DIAGNOSIS — E782 Mixed hyperlipidemia: Secondary | ICD-10-CM

## 2013-10-10 DIAGNOSIS — E785 Hyperlipidemia, unspecified: Secondary | ICD-10-CM | POA: Insufficient documentation

## 2013-10-10 DIAGNOSIS — E1169 Type 2 diabetes mellitus with other specified complication: Secondary | ICD-10-CM | POA: Insufficient documentation

## 2013-10-10 DIAGNOSIS — E1069 Type 1 diabetes mellitus with other specified complication: Secondary | ICD-10-CM | POA: Insufficient documentation

## 2013-10-10 NOTE — Addendum Note (Signed)
Addended byOrlene Plum H on: 10/10/2013 11:29 AM   Modules accepted: Orders

## 2013-10-13 ENCOUNTER — Other Ambulatory Visit: Payer: Self-pay | Admitting: *Deleted

## 2013-10-13 DIAGNOSIS — E785 Hyperlipidemia, unspecified: Secondary | ICD-10-CM

## 2013-10-13 NOTE — Addendum Note (Signed)
Addended byOrlene Plum H on: 10/13/2013 10:07 AM   Modules accepted: Orders

## 2013-10-13 NOTE — Addendum Note (Signed)
Addended byOrlene Plum H on: 10/13/2013 10:09 AM   Modules accepted: Orders

## 2013-10-14 ENCOUNTER — Ambulatory Visit
Admission: RE | Admit: 2013-10-14 | Discharge: 2013-10-14 | Disposition: A | Discharge status: Home / Self Care | Payer: Medicaid Other | Source: Ambulatory Visit

## 2013-10-14 DIAGNOSIS — Z1231 Encounter for screening mammogram for malignant neoplasm of breast: Secondary | ICD-10-CM

## 2013-10-16 ENCOUNTER — Ambulatory Visit: Payer: Medicaid Other

## 2013-10-17 ENCOUNTER — Telehealth: Payer: Self-pay | Admitting: Interventional Cardiology

## 2013-10-17 NOTE — Telephone Encounter (Signed)
Pt had [facility] lab draw and stated she thought our office called her today with results, informed her dr/cma are out of office but will watch for labs. Pt was very groggy during conversation and stated she just got out of the hospital. She will wait for results.

## 2013-10-17 NOTE — Telephone Encounter (Signed)
New message ° ° ° ° ° ° °Calling to get lab results °

## 2013-10-20 NOTE — Telephone Encounter (Signed)
Noted, see lipid note. I will try and call pt today with lab results.

## 2013-10-21 ENCOUNTER — Other Ambulatory Visit: Payer: Self-pay | Admitting: Cardiology

## 2013-10-21 DIAGNOSIS — E785 Hyperlipidemia, unspecified: Secondary | ICD-10-CM

## 2013-10-22 NOTE — Progress Notes (Signed)
Lipids controlled

## 2013-11-26 DIAGNOSIS — H26499 Other secondary cataract, unspecified eye: Secondary | ICD-10-CM | POA: Insufficient documentation

## 2013-12-18 ENCOUNTER — Other Ambulatory Visit: Payer: Self-pay

## 2013-12-18 MED ORDER — FUROSEMIDE 40 MG PO TABS: 40.0000 mg | ORAL_TABLET | Freq: Every day | ORAL | Status: DC

## 2014-01-28 ENCOUNTER — Encounter: Payer: Self-pay | Admitting: *Deleted

## 2014-01-28 ENCOUNTER — Encounter: Payer: Medicaid Other | Attending: Internal Medicine | Admitting: *Deleted

## 2014-01-28 VITALS — Ht 60.0 in | Wt 187.0 lb

## 2014-01-28 DIAGNOSIS — Z794 Long term (current) use of insulin: Secondary | ICD-10-CM | POA: Diagnosis not present

## 2014-01-28 DIAGNOSIS — E109 Type 1 diabetes mellitus without complications: Secondary | ICD-10-CM | POA: Diagnosis not present

## 2014-01-28 DIAGNOSIS — Z713 Dietary counseling and surveillance: Secondary | ICD-10-CM | POA: Diagnosis not present

## 2014-01-28 NOTE — Progress Notes (Signed)
Introduction to Insulin Pump Therapy:  Appt start time: 1530 end time:  1700.  Assessment:  This patient has DM 1 and their primary concerns today: interested in pursuing insulin pump and possibly CGM.  This patient is interested in learning more about insulin pump therapy because of taking several injections a day, hypoglycemia with hyperglycemia She is here with her grown daughter who appears supportive.  MEDICATIONS: Basal Insulin: 32 units of Lantus at 16 units twice a day via pen  Bolus Insulin: Novolog  via pen with correction dose only Total of insulin doses per day 4 Other diabetes medications: no  Patient does not currently have Ketone Strips  This patient is  currently adjusting bolus insulin based BG at a correction ratio of 2 units per 50 mg/dl above 790 This patient is not currently adjusting bolus insulin based on carb intake   Patient states knowledge of Carb Counting is poor  Usual physical activity: walks as able within her house daily  Last A1c was 7.4%  Patient states complications from diabetes include Retinopathy along with corneal implant, neuropathy She has visual impairment and requires magnifying glass to read. Patient states they have had hypoglycemia several times in the past month Patient states their biggest barrier with diabetes is wide BG swings  Progress Towards Obtaining an Insulin PumpGoal(s):  In progress.  Patient states their expectations of pump therapy include: better BG control and fewer injections Patient expresses understanding that for improved outcomes for their diabetes on an insulin pump they will:  Check BG 4-6 times per day  Change out pump infusion set at least every 3 days  Upload pump information to software on a regular basis so provider can assess patterns and make setting adjustments.  If CGM is covered by insurance, she may pursue that. If she obtains CGM, she understands she will need to calibrate at least twice a day      Intervention:    Taught difference between delivery of insulin via syringe/pen compared to insulin pump.  Demonstrated improved insulin delivery via pump due to improved accuracy of dose and flexibility of adjusting bolus insulin based on carb intake and BG correction.  Demonstrated Medtronic and T-Slim pump, insulin reservoir and infusion set options, and button pushing for bolus delivery of insulin through the pump. She was only able to see the T-Slim screen with color screen so she choose that for her pump of choice.  Explained importance of testing BG at least 4 times per day for appropriate correction of [place] BG   Emphasized importance of follow up after Pump Start for appropriate pump setting adjustments and on-going training on more advanced features.  Handouts given during visit include:  Intro to Pumping Handout  Monitoring/Evaluation:    Patient does want to continue with pursuit of insulin pump.  She gave me permission to contact [employer] pump company to initiate pump paperwork and purchase process  Patient to let this office know when pump is ordered so we can make appointment for training.

## 2014-03-02 IMAGING — CR DG CHEST 2V
2 series · 2 of 2 positions shown · non-contrast
Comparison: 01/04/2013

CLINICAL DATA: History of myocardial infarction, chest pain

EXAM:
CHEST  2 VIEW

[w chest pa]
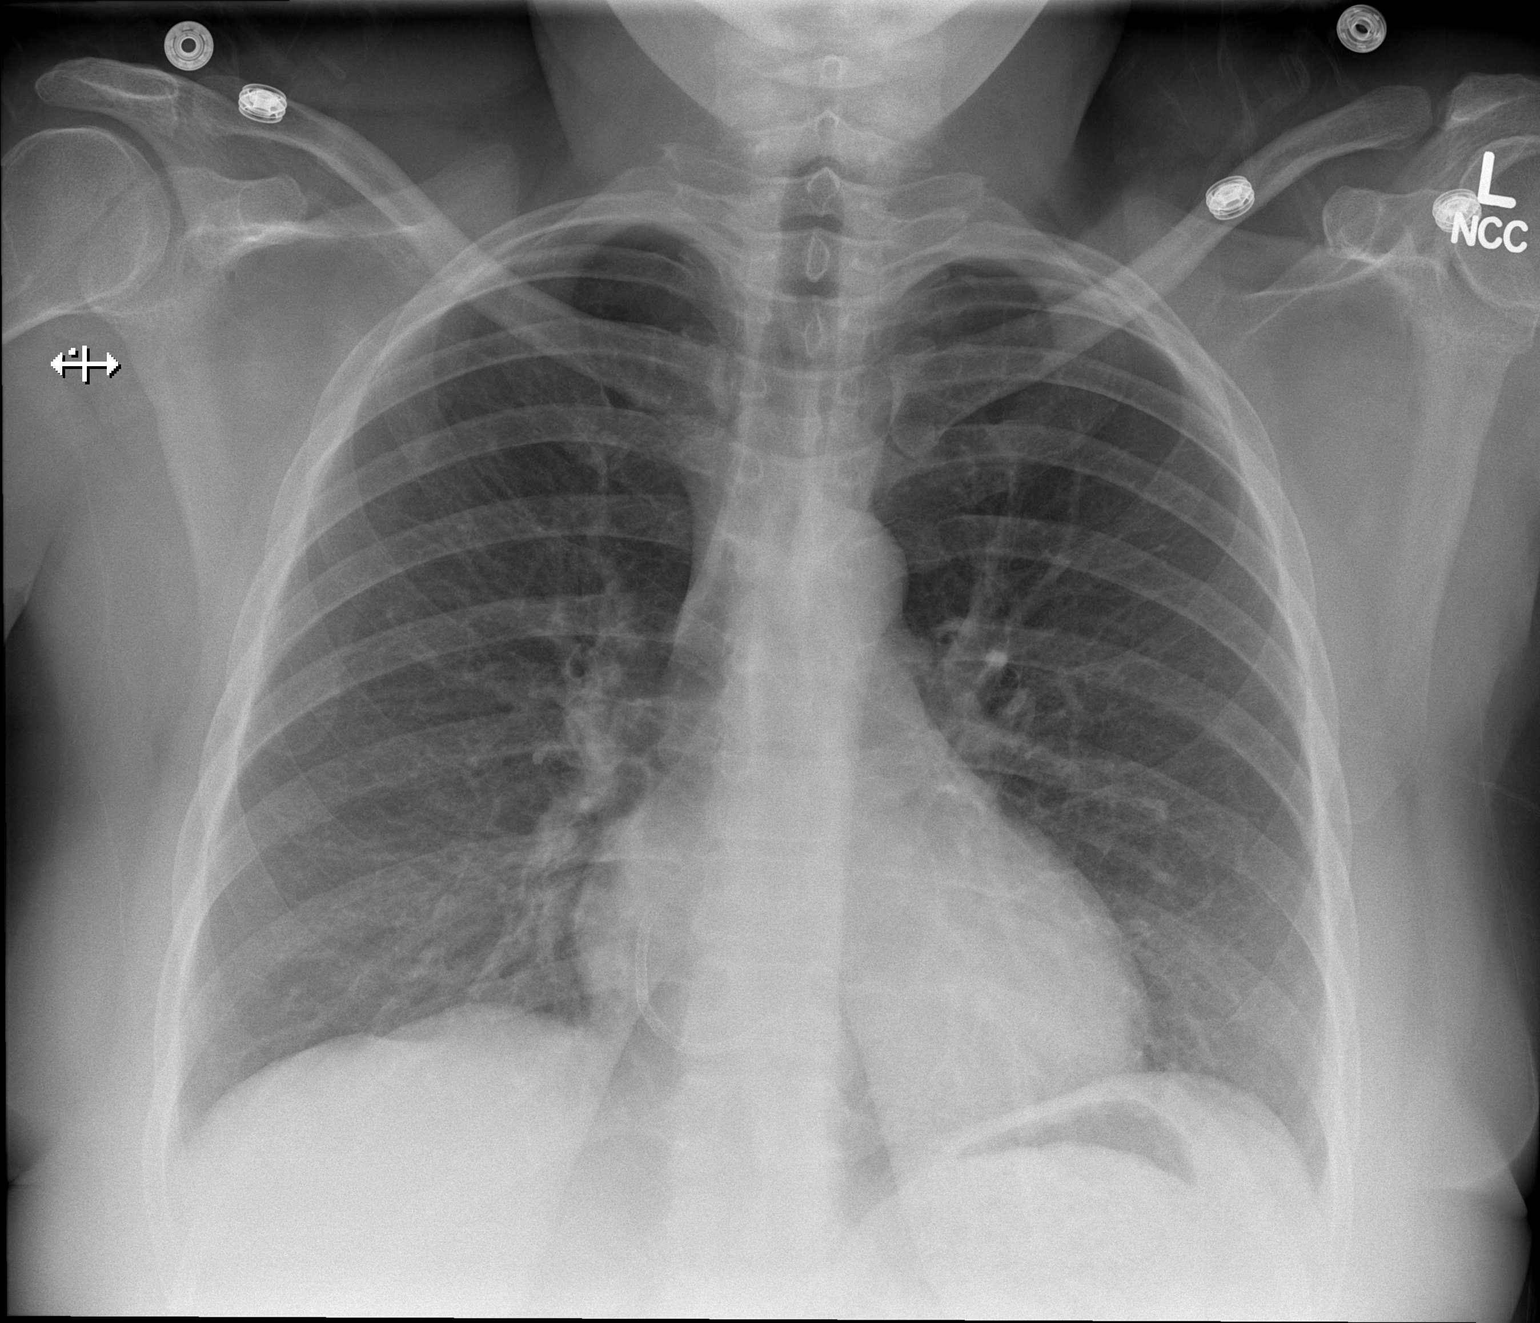

[w chest lat]
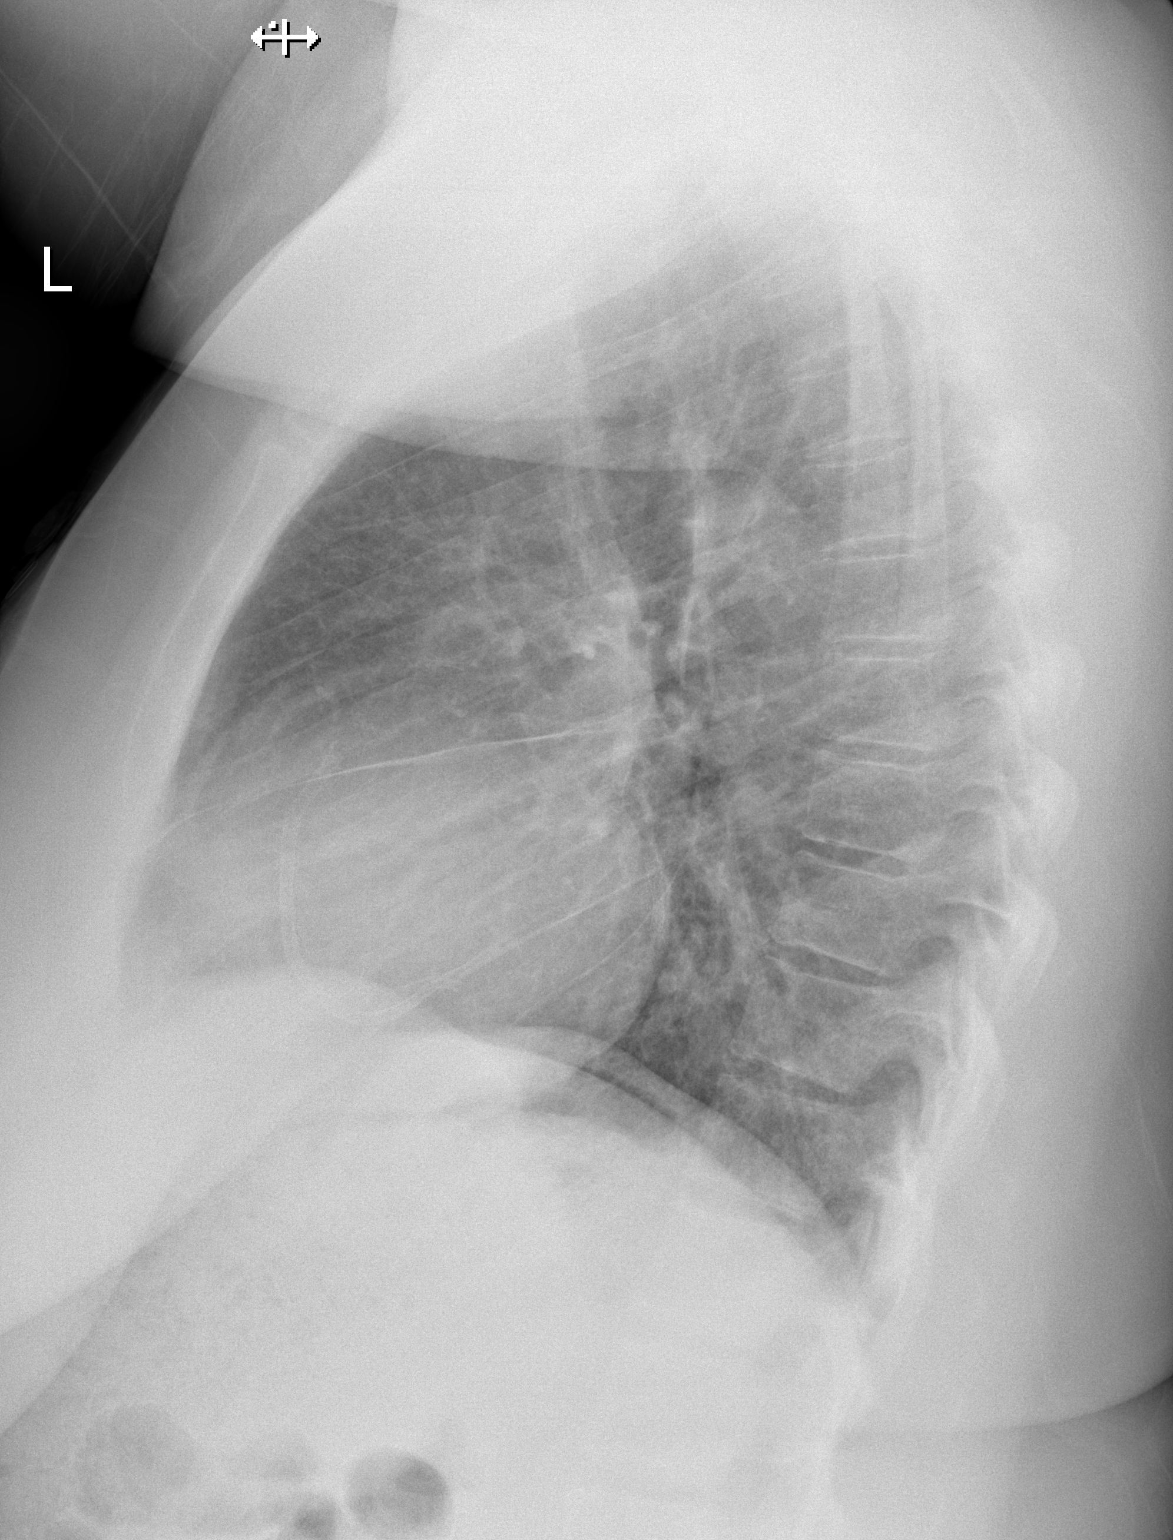

[2 of 2 positions shown; findings below may reference images not displayed]

FINDINGS: The heart size and mediastinal contours are within normal limits.
Both lungs are clear. The visualized skeletal structures are
unremarkable. Right coronary artery stent noted.
IMPRESSION: No active cardiopulmonary disease.

## 2014-06-28 DIAGNOSIS — H4050X0 Glaucoma secondary to other eye disorders, unspecified eye, stage unspecified: Secondary | ICD-10-CM | POA: Insufficient documentation

## 2014-07-07 ENCOUNTER — Other Ambulatory Visit: Payer: Self-pay | Admitting: *Deleted

## 2014-07-07 MED ORDER — FUROSEMIDE 40 MG PO TABS: 40.0000 mg | ORAL_TABLET | Freq: Every day | ORAL | Status: DC

## 2014-07-08 ENCOUNTER — Encounter (HOSPITAL_COMMUNITY): Payer: Self-pay | Admitting: Interventional Cardiology

## 2014-07-09 ENCOUNTER — Encounter (HOSPITAL_COMMUNITY): Payer: Self-pay | Admitting: Interventional Cardiology

## 2014-08-05 ENCOUNTER — Other Ambulatory Visit: Payer: Self-pay

## 2014-08-05 MED ORDER — FUROSEMIDE 40 MG PO TABS: 40.0000 mg | ORAL_TABLET | Freq: Every day | ORAL | Status: DC

## 2014-08-24 ENCOUNTER — Other Ambulatory Visit: Payer: Self-pay

## 2014-08-24 DIAGNOSIS — Z1231 Encounter for screening mammogram for malignant neoplasm of breast: Secondary | ICD-10-CM

## 2014-09-17 DIAGNOSIS — IMO0002 Reserved for concepts with insufficient information to code with codable children: Secondary | ICD-10-CM | POA: Insufficient documentation

## 2014-09-17 DIAGNOSIS — Z961 Presence of intraocular lens: Secondary | ICD-10-CM | POA: Insufficient documentation

## 2014-10-02 ENCOUNTER — Ambulatory Visit (INDEPENDENT_AMBULATORY_CARE_PROVIDER_SITE_OTHER): Payer: Medicaid Other | Admitting: Interventional Cardiology

## 2014-10-02 ENCOUNTER — Encounter: Payer: Self-pay | Admitting: Interventional Cardiology

## 2014-10-02 VITALS — BP 122/64 | HR 82 | Ht 60.0 in | Wt 198.0 lb

## 2014-10-02 DIAGNOSIS — I2510 Atherosclerotic heart disease of native coronary artery without angina pectoris: Secondary | ICD-10-CM

## 2014-10-02 DIAGNOSIS — E785 Hyperlipidemia, unspecified: Secondary | ICD-10-CM

## 2014-10-02 DIAGNOSIS — I252 Old myocardial infarction: Secondary | ICD-10-CM

## 2014-10-02 DIAGNOSIS — Z720 Tobacco use: Secondary | ICD-10-CM

## 2014-10-02 NOTE — Patient Instructions (Signed)
Your physician recommends that you return for FASTING lab work : CMP/ lipid  Your physician wants you to follow-up in: 6 months with Dr. Eldridge Dace. You will receive a reminder letter in the mail two months in advance. If you don't receive a letter, please call our office to schedule the follow-up appointment.  Your physician recommends that you continue on your current medications as directed. Please refer to the Current Medication list given to you today.

## 2014-10-02 NOTE — Progress Notes (Signed)
Patient ID: [name] [name], female   DOB: 1966/03/27, 49 y.o.   MRN: [id number]    [address] [place], [place]  [address] Phone: [phone] Fax:  [phone]  Date:  10/02/2014   ID:  [name] [name], DOB [birth year], MRN [id number]  PCP:  Lonia Blood, MD      History of Present Illness: [name] [name] is a 49 y.o. female -year-old woman with history of inferior MI several years ago. She had a non-STEMI in May of 2014 and had a stent placed in her circumflex. This was a drug-eluting stent. She has done well since that time. She denies any recent chest discomfort or shortness of breath. She had several eye surgeries in January unfortunately, had bleeding problems after this. Her vision in her left eye is severely decreased. She may need more surgery in cluing a corneal transplant. She has not been taking aspirin because she is allergic. She had been on only clopidogrel. We stopped this medication in 2015 ago since it had been more than 6 months since her last stent.   She still smokes.  She has gained weight.  SHe wants to use a diet pill.     Wt Readings from Last 3 Encounters:  10/02/14 198 lb (89.812 kg)  01/28/14 187 lb (84.823 kg)  10/03/13 187 lb (84.823 kg)     Past Medical History  Diagnosis Date  . Hypertension   . Sebaceous cyst   . Anemia   . Allergic rhinitis   . Hypertension   . Diabetic retinopathy   . Acute osteomyelitis   . Esophageal ulcer   . Esophageal stenosis   . Gastroparesis   . Renal insufficiency   . GERD (gastroesophageal reflux disease)   . Hyperlipidemia   . Depression   . Anxiety   . Polysubstance abuse   . Esophagitis   . CAD (coronary artery disease) 12/12    s/p DES mid and distal RCA with 50% LAD  . Heart murmur   . Pneumonia 2012  . Orthopnoea   . Type II diabetes mellitus   . Inferior MI 01/20/2009    /notes on 12/19/2012, "that's the only one I've had" (12/19/2012)  . Acute myocardial infarction, unspecified site, initial episode of care     . Acute myocardial infarction of other lateral wall, initial episode of care   . Rheumatoid arthritis(714.0)   . Bipolar affective   . History of stomach ulcers   . Anginal pain     07/15/13- no chest pain in months"  . Stroke 2011    denies residual on 12/19/2012.  "Years ago"  . Daily headache     not daily  . Migraines     Current Outpatient Prescriptions  Medication Sig Dispense Refill  . acetaminophen (TYLENOL) 500 MG tablet Take 500 mg by mouth every 6 (six) hours as needed for moderate pain.     . brinzolamide (AZOPT) 1 % ophthalmic suspension Place 1 drop into the left eye 3 (three) times daily.     . ferrous sulfate 325 (65 FE) MG tablet Take 325 mg by mouth daily with breakfast.     . furosemide (LASIX) 40 MG tablet Take 1 tablet (40 mg total) by mouth daily. 30 tablet 2  . insulin aspart (NOVOLOG) 100 UNIT/ML injection Inject 4-16 Units into the skin 3 (three) times daily with meals. Sliding scale CBG 100-150; 4 units, 151-200; 6 units, 201-250; 8 units, 251-300; 10 units, 301-350; 12 units, 351-400; 14  units, > 401; call doctor    . insulin glargine (LANTUS) 100 UNIT/ML injection Inject 16 Units into the skin 2 (two) times daily.     . metoprolol (LOPRESSOR) 50 MG tablet Take 50 mg by mouth 2 (two) times daily.    . nitroGLYCERIN (NITROSTAT) 0.4 MG SL tablet Place 0.4 mg under the tongue every 5 (five) minutes as needed for chest pain.     Marland Kitchen ondansetron (ZOFRAN) 4 MG tablet Take 4 mg by mouth every 6 (six) hours as needed for nausea.    . pantoprazole (PROTONIX) 40 MG tablet Take 40 mg by mouth 2 (two) times daily.    . sertraline (ZOLOFT) 100 MG tablet Take 100 mg by mouth daily.     . traMADol (ULTRAM) 50 MG tablet Take 50 mg by mouth every 6 (six) hours as needed for pain.      No current facility-administered medications for this visit.    Allergies:    Allergies  Allergen Reactions  . Aspirin Other (See Comments)    Stomach bleeds  . Codeine Hives and Itching     Social History:  The patient  reports that she has been smoking Cigarettes.  She has a 11 pack-year smoking history. She has never used smokeless tobacco. She reports that she drinks alcohol. She reports that she does not use illicit drugs.   Family History:  The patient's family history includes Breast cancer (age of onset: 25) in her mother; Hypertension in her father and sister; Stomach cancer in her sister. There is no history of Colon cancer.   ROS:  Please see the history of present illness.  No nausea, vomiting.  No fevers, chills.  No focal weakness.  No dysuria. Visual problems in her left eye   All other systems reviewed and negative.   PHYSICAL EXAM: VS:  BP 122/64 mmHg  Pulse 82  Ht 5' (1.524 m)  Wt 198 lb (89.812 kg)  BMI 38.67 kg/m2 Well nourished, well developed, in no acute distress HEENT: normal Neck: no JVD, no carotid bruits Cardiac:  normal S1, S2; RRR;  Lungs:  clear to auscultation bilaterally, no wheezing, rhonchi or rales Abd: soft, obese Ext: no edema Skin: warm and dry Neuro:   no focal abnormalities noted Psych: normal affect   ECG: NSR, inferior T wave changes, no change from 1/15  ASSESSMENT AND PLAN:  Coronary atherosclerosis of native coronary artery  Stopped Plavix Tablet, 75 MG, 1 tablet, Orally, Once a day per PCP Notes: No angina. Last stent was 5/14. Allergic to aspirin. Bleeding in the eye is why we stopped antiplatelet therapy.   2. MI, Old   continue Metoprolol Tartrate Tablet, 25 MG, 1 tablet, Orally, Twice a day, 30 day(s), 60, Refills 11 Notes: No CHF. 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    3. Hypertension, essential  stopped Losartan Potassium Tablet, 25 MG, 1/2 tablet, Orally, Once a day due to hypotension Notes: Controlled.     4. Tobacco dependence  Notes: She needs to stop smoking. Can try nicorette gum.  Had a rash from the patch.  She did not want an e cig.    5. Mixed hyperlipidemia  Continue  Atorvastatin Calcium Tablet, 40 MG, 1 tablet, Orally, Once a day per PCP; she does require a lipid check. She is a very difficult stick and is typically stuck in the foot to get blood. Her last LDL was above target. She states that she continues to take  her statin. This may need to be increased to 80 mg daily based on her next blood test result.  Will check CMet and lipids.     Preventive Medicine  Adult topics discussed:  Exercise: 5 days a week, at least 30 minutes of aerobic exercise.  Weight loss- diet pill not allowed.  Improve diet.  Weight loss.     Signed, Fredric Mare, MD, [facility] 10/02/2014 4:03 PM

## 2014-10-14 ENCOUNTER — Other Ambulatory Visit (INDEPENDENT_AMBULATORY_CARE_PROVIDER_SITE_OTHER): Payer: Medicaid Other | Admitting: *Deleted

## 2014-10-14 ENCOUNTER — Other Ambulatory Visit: Payer: Medicaid Other

## 2014-10-14 DIAGNOSIS — I2510 Atherosclerotic heart disease of native coronary artery without angina pectoris: Secondary | ICD-10-CM

## 2014-10-14 DIAGNOSIS — E785 Hyperlipidemia, unspecified: Secondary | ICD-10-CM

## 2014-10-14 LAB — COMPREHENSIVE METABOLIC PANEL
ALBUMIN: 3.8 g/dL (ref 3.5–5.2)
ALK PHOS: 170 U/L — AB (ref 39–117)
ALT: 5 U/L (ref 0–35)
AST: 17 U/L (ref 0–37)
BUN: 18 mg/dL (ref 6–23)
CO2: 26 mEq/L (ref 19–32)
CREATININE: 0.91 mg/dL (ref 0.40–1.20)
Calcium: 9.4 mg/dL (ref 8.4–10.5)
Chloride: 105 mEq/L (ref 96–112)
GFR: 84.84 mL/min (ref >60.00)
GLUCOSE: 60 mg/dL — AB (ref 70–99)
Potassium: 3.9 mEq/L (ref 3.5–5.1)
Sodium: 137 mEq/L (ref 135–145)
Total Bilirubin: 0.5 mg/dL (ref 0.2–1.2)
Total Protein: 8.2 g/dL (ref 6.0–8.3)

## 2014-10-14 LAB — LIPID PANEL
CHOL/HDL RATIO: 3
Cholesterol: 186 mg/dL (ref 0–200)
HDL: 72.8 mg/dL (ref >39.00)
LDL Cholesterol: 99 mg/dL (ref 0–99)
NonHDL: 113.2
TRIGLYCERIDES: 71 mg/dL (ref 0.0–149.0)
VLDL: 14.2 mg/dL (ref 0.0–40.0)

## 2014-10-16 ENCOUNTER — Ambulatory Visit
Admission: RE | Admit: 2014-10-16 | Discharge: 2014-10-16 | Disposition: A | Discharge status: Home / Self Care | Payer: Medicaid Other | Source: Ambulatory Visit

## 2014-10-16 DIAGNOSIS — Z1231 Encounter for screening mammogram for malignant neoplasm of breast: Secondary | ICD-10-CM

## 2014-11-22 ENCOUNTER — Other Ambulatory Visit: Payer: Self-pay | Admitting: Interventional Cardiology

## 2015-02-21 ENCOUNTER — Emergency Department (HOSPITAL_COMMUNITY): Payer: Medicaid Other

## 2015-02-21 ENCOUNTER — Encounter (HOSPITAL_COMMUNITY): Payer: Self-pay | Admitting: Emergency Medicine

## 2015-02-21 ENCOUNTER — Inpatient Hospital Stay (HOSPITAL_COMMUNITY)
Admission: EM | Admit: 2015-02-21 | Discharge: 2015-03-02 | DRG: 074 | Disposition: A | Discharge status: Home / Self Care | Payer: Medicaid Other | Attending: Internal Medicine | Admitting: Internal Medicine

## 2015-02-21 DIAGNOSIS — J309 Allergic rhinitis, unspecified: Secondary | ICD-10-CM | POA: Diagnosis present

## 2015-02-21 DIAGNOSIS — K222 Esophageal obstruction: Secondary | ICD-10-CM

## 2015-02-21 DIAGNOSIS — N183 Type 1 diabetes mellitus with diabetic chronic kidney disease: Secondary | ICD-10-CM | POA: Diagnosis present

## 2015-02-21 DIAGNOSIS — K219 Gastro-esophageal reflux disease without esophagitis: Secondary | ICD-10-CM | POA: Diagnosis present

## 2015-02-21 DIAGNOSIS — Z8673 Personal history of transient ischemic attack (TIA), and cerebral infarction without residual deficits: Secondary | ICD-10-CM

## 2015-02-21 DIAGNOSIS — N200 Calculus of kidney: Secondary | ICD-10-CM | POA: Diagnosis present

## 2015-02-21 DIAGNOSIS — R109 Unspecified abdominal pain: Secondary | ICD-10-CM

## 2015-02-21 DIAGNOSIS — I5022 Chronic systolic (congestive) heart failure: Secondary | ICD-10-CM | POA: Diagnosis not present

## 2015-02-21 DIAGNOSIS — E108 Type 1 diabetes mellitus with unspecified complications: Secondary | ICD-10-CM

## 2015-02-21 DIAGNOSIS — F319 Bipolar disorder, unspecified: Secondary | ICD-10-CM | POA: Diagnosis present

## 2015-02-21 DIAGNOSIS — I2510 Atherosclerotic heart disease of native coronary artery without angina pectoris: Secondary | ICD-10-CM | POA: Diagnosis present

## 2015-02-21 DIAGNOSIS — Z79899 Other long term (current) drug therapy: Secondary | ICD-10-CM

## 2015-02-21 DIAGNOSIS — Z794 Long term (current) use of insulin: Secondary | ICD-10-CM

## 2015-02-21 DIAGNOSIS — N1831 Chronic kidney disease, stage 3a: Secondary | ICD-10-CM | POA: Insufficient documentation

## 2015-02-21 DIAGNOSIS — N179 Acute kidney failure, unspecified: Secondary | ICD-10-CM | POA: Diagnosis not present

## 2015-02-21 DIAGNOSIS — E1069 Type 1 diabetes mellitus with other specified complication: Secondary | ICD-10-CM | POA: Diagnosis present

## 2015-02-21 DIAGNOSIS — I509 Heart failure, unspecified: Secondary | ICD-10-CM | POA: Diagnosis present

## 2015-02-21 DIAGNOSIS — B9689 Other specified bacterial agents as the cause of diseases classified elsewhere: Secondary | ICD-10-CM | POA: Diagnosis present

## 2015-02-21 DIAGNOSIS — E785 Hyperlipidemia, unspecified: Secondary | ICD-10-CM | POA: Diagnosis present

## 2015-02-21 DIAGNOSIS — D649 Anemia, unspecified: Secondary | ICD-10-CM | POA: Diagnosis present

## 2015-02-21 DIAGNOSIS — Z791 Long term (current) use of non-steroidal anti-inflammatories (NSAID): Secondary | ICD-10-CM | POA: Diagnosis not present

## 2015-02-21 DIAGNOSIS — F17210 Nicotine dependence, cigarettes, uncomplicated: Secondary | ICD-10-CM | POA: Diagnosis present

## 2015-02-21 DIAGNOSIS — E118 Type 2 diabetes mellitus with unspecified complications: Secondary | ICD-10-CM

## 2015-02-21 DIAGNOSIS — E1110 Type 2 diabetes mellitus with ketoacidosis without coma: Secondary | ICD-10-CM | POA: Diagnosis present

## 2015-02-21 DIAGNOSIS — I10 Essential (primary) hypertension: Secondary | ICD-10-CM | POA: Diagnosis present

## 2015-02-21 DIAGNOSIS — Z8249 Family history of ischemic heart disease and other diseases of the circulatory system: Secondary | ICD-10-CM | POA: Diagnosis not present

## 2015-02-21 DIAGNOSIS — Z955 Presence of coronary angioplasty implant and graft: Secondary | ICD-10-CM | POA: Diagnosis not present

## 2015-02-21 DIAGNOSIS — E1310 Other specified diabetes mellitus with ketoacidosis without coma: Secondary | ICD-10-CM

## 2015-02-21 DIAGNOSIS — E876 Hypokalemia: Secondary | ICD-10-CM | POA: Diagnosis not present

## 2015-02-21 DIAGNOSIS — K210 Gastro-esophageal reflux disease with esophagitis: Secondary | ICD-10-CM | POA: Diagnosis present

## 2015-02-21 DIAGNOSIS — E1143 Type 2 diabetes mellitus with diabetic autonomic (poly)neuropathy: Secondary | ICD-10-CM | POA: Diagnosis not present

## 2015-02-21 DIAGNOSIS — E1169 Type 2 diabetes mellitus with other specified complication: Secondary | ICD-10-CM | POA: Diagnosis present

## 2015-02-21 DIAGNOSIS — K3184 Gastroparesis: Secondary | ICD-10-CM | POA: Diagnosis present

## 2015-02-21 DIAGNOSIS — E1165 Type 2 diabetes mellitus with hyperglycemia: Secondary | ICD-10-CM | POA: Diagnosis not present

## 2015-02-21 DIAGNOSIS — R1011 Right upper quadrant pain: Secondary | ICD-10-CM

## 2015-02-21 DIAGNOSIS — I252 Old myocardial infarction: Secondary | ICD-10-CM

## 2015-02-21 DIAGNOSIS — F17200 Nicotine dependence, unspecified, uncomplicated: Secondary | ICD-10-CM | POA: Diagnosis not present

## 2015-02-21 DIAGNOSIS — F419 Anxiety disorder, unspecified: Secondary | ICD-10-CM | POA: Diagnosis present

## 2015-02-21 DIAGNOSIS — E860 Dehydration: Secondary | ICD-10-CM | POA: Diagnosis present

## 2015-02-21 DIAGNOSIS — Z885 Allergy status to narcotic agent status: Secondary | ICD-10-CM | POA: Diagnosis not present

## 2015-02-21 DIAGNOSIS — E0810 Diabetes mellitus due to underlying condition with ketoacidosis without coma: Secondary | ICD-10-CM | POA: Diagnosis not present

## 2015-02-21 DIAGNOSIS — I129 Hypertensive chronic kidney disease with stage 1 through stage 4 chronic kidney disease, or unspecified chronic kidney disease: Secondary | ICD-10-CM | POA: Diagnosis present

## 2015-02-21 DIAGNOSIS — Z886 Allergy status to analgesic agent status: Secondary | ICD-10-CM

## 2015-02-21 DIAGNOSIS — M069 Rheumatoid arthritis, unspecified: Secondary | ICD-10-CM | POA: Diagnosis present

## 2015-02-21 DIAGNOSIS — R112 Nausea with vomiting, unspecified: Secondary | ICD-10-CM | POA: Diagnosis present

## 2015-02-21 DIAGNOSIS — N390 Urinary tract infection, site not specified: Secondary | ICD-10-CM | POA: Diagnosis not present

## 2015-02-21 DIAGNOSIS — E11319 Type 2 diabetes mellitus with unspecified diabetic retinopathy without macular edema: Secondary | ICD-10-CM | POA: Diagnosis present

## 2015-02-21 DIAGNOSIS — E1159 Type 2 diabetes mellitus with other circulatory complications: Secondary | ICD-10-CM | POA: Diagnosis present

## 2015-02-21 DIAGNOSIS — R197 Diarrhea, unspecified: Secondary | ICD-10-CM | POA: Diagnosis not present

## 2015-02-21 DIAGNOSIS — Z720 Tobacco use: Secondary | ICD-10-CM | POA: Diagnosis present

## 2015-02-21 DIAGNOSIS — R1319 Other dysphagia: Secondary | ICD-10-CM

## 2015-02-21 DIAGNOSIS — E1022 Type 1 diabetes mellitus with diabetic chronic kidney disease: Secondary | ICD-10-CM | POA: Diagnosis present

## 2015-02-21 DIAGNOSIS — E1122 Type 2 diabetes mellitus with diabetic chronic kidney disease: Secondary | ICD-10-CM | POA: Diagnosis present

## 2015-02-21 DIAGNOSIS — R1310 Dysphagia, unspecified: Secondary | ICD-10-CM

## 2015-02-21 HISTORY — DX: Chronic kidney disease, stage 3 unspecified: N18.30

## 2015-02-21 LAB — BASIC METABOLIC PANEL
ANION GAP: 7 (ref 5–15)
Anion gap: 17 — ABNORMAL HIGH (ref 5–15)
BUN: 20 mg/dL (ref 6–20)
BUN: 15 mg/dL (ref 6–20)
CHLORIDE: 101 mmol/L (ref 101–111)
CO2: 24 mmol/L (ref 22–32)
CO2: 27 mmol/L (ref 22–32)
CREATININE: 0.99 mg/dL (ref 0.44–1.00)
Calcium: 9.3 mg/dL (ref 8.9–10.3)
Calcium: 8.7 mg/dL — ABNORMAL LOW (ref 8.9–10.3)
Chloride: 114 mmol/L — ABNORMAL HIGH (ref 101–111)
Creatinine, Ser: 1.34 mg/dL — ABNORMAL HIGH (ref 0.44–1.00)
GFR calc Af Amer: 53 mL/min — ABNORMAL LOW (ref >60)
GFR calc Af Amer: >60 mL/min (ref >60)
GFR calc non Af Amer: 46 mL/min — ABNORMAL LOW (ref >60)
Glucose, Bld: 367 mg/dL — ABNORMAL HIGH (ref 65–99)
Glucose, Bld: 149 mg/dL — ABNORMAL HIGH (ref 65–99)
POTASSIUM: 3.1 mmol/L — AB (ref 3.5–5.1)
Potassium: 3 mmol/L — ABNORMAL LOW (ref 3.5–5.1)
Sodium: 142 mmol/L (ref 135–145)
Sodium: 148 mmol/L — ABNORMAL HIGH (ref 135–145)

## 2015-02-21 LAB — URINALYSIS, ROUTINE W REFLEX MICROSCOPIC
Glucose, UA: 100 mg/dL — AB
Ketones, ur: 40 mg/dL — AB
NITRITE: NEGATIVE
Protein, ur: >300 mg/dL — AB
SPECIFIC GRAVITY, URINE: 1.022 (ref 1.005–1.030)
Urobilinogen, UA: 1 mg/dL (ref 0.0–1.0)
pH: 6 (ref 5.0–8.0)

## 2015-02-21 LAB — COMPREHENSIVE METABOLIC PANEL
ALBUMIN: 4 g/dL (ref 3.5–5.0)
ALT: 9 U/L — AB (ref 14–54)
AST: 28 U/L (ref 15–41)
Alkaline Phosphatase: 162 U/L — ABNORMAL HIGH (ref 38–126)
Anion gap: 19 — ABNORMAL HIGH (ref 5–15)
BILIRUBIN TOTAL: 0.9 mg/dL (ref 0.3–1.2)
BUN: 20 mg/dL (ref 6–20)
CALCIUM: 9.9 mg/dL (ref 8.9–10.3)
CHLORIDE: 99 mmol/L — AB (ref 101–111)
CO2: 22 mmol/L (ref 22–32)
CREATININE: 1.72 mg/dL — AB (ref 0.44–1.00)
GFR, EST AFRICAN AMERICAN: 39 mL/min — AB (ref >60)
GFR, EST NON AFRICAN AMERICAN: 34 mL/min — AB (ref >60)
Glucose, Bld: 276 mg/dL — ABNORMAL HIGH (ref 65–99)
Potassium: 2.8 mmol/L — ABNORMAL LOW (ref 3.5–5.1)
SODIUM: 140 mmol/L (ref 135–145)
Total Protein: 9.6 g/dL — ABNORMAL HIGH (ref 6.5–8.1)

## 2015-02-21 LAB — GLUCOSE, CAPILLARY
GLUCOSE-CAPILLARY: 137 mg/dL — AB (ref 65–99)
Glucose-Capillary: 230 mg/dL — ABNORMAL HIGH (ref 65–99)
Glucose-Capillary: 220 mg/dL — ABNORMAL HIGH (ref 65–99)
Glucose-Capillary: 155 mg/dL — ABNORMAL HIGH (ref 65–99)
Glucose-Capillary: 131 mg/dL — ABNORMAL HIGH (ref 65–99)
Glucose-Capillary: 152 mg/dL — ABNORMAL HIGH (ref 65–99)
Glucose-Capillary: 157 mg/dL — ABNORMAL HIGH (ref 65–99)

## 2015-02-21 LAB — ACETAMINOPHEN LEVEL: Acetaminophen (Tylenol), Serum: <10 ug/mL — ABNORMAL LOW (ref 10–30)

## 2015-02-21 LAB — RAPID URINE DRUG SCREEN, HOSP PERFORMED
Amphetamines: NOT DETECTED
BENZODIAZEPINES: NOT DETECTED
Barbiturates: NOT DETECTED
Cocaine: NOT DETECTED
Opiates: NOT DETECTED
Tetrahydrocannabinol: NOT DETECTED

## 2015-02-21 LAB — CBC
HCT: 45.9 % (ref 36.0–46.0)
Hemoglobin: 15.6 g/dL — ABNORMAL HIGH (ref 12.0–15.0)
MCH: 27.2 pg (ref 26.0–34.0)
MCHC: 34 g/dL (ref 30.0–36.0)
MCV: 80 fL (ref 78.0–100.0)
PLATELETS: 400 10*3/uL (ref 150–400)
RBC: 5.74 MIL/uL — AB (ref 3.87–5.11)
RDW: 17.1 % — ABNORMAL HIGH (ref 11.5–15.5)
WBC: 12.8 10*3/uL — ABNORMAL HIGH (ref 4.0–10.5)

## 2015-02-21 LAB — I-STAT CG4 LACTIC ACID, ED: Lactic Acid, Venous: 2.58 mmol/L (ref 0.5–2.0)

## 2015-02-21 LAB — LIPASE, BLOOD: Lipase: 20 U/L — ABNORMAL LOW (ref 22–51)

## 2015-02-21 LAB — I-STAT TROPONIN, ED: Troponin i, poc: 0.01 ng/mL (ref 0.00–0.08)

## 2015-02-21 LAB — SALICYLATE LEVEL: Salicylate Lvl: <4 mg/dL (ref 2.8–30.0)

## 2015-02-21 LAB — CBG MONITORING, ED
Glucose-Capillary: 292 mg/dL — ABNORMAL HIGH (ref 65–99)
Glucose-Capillary: 337 mg/dL — ABNORMAL HIGH (ref 65–99)
Glucose-Capillary: 283 mg/dL — ABNORMAL HIGH (ref 65–99)

## 2015-02-21 LAB — URINE MICROSCOPIC-ADD ON

## 2015-02-21 LAB — ETHANOL: Alcohol, Ethyl (B): <5 mg/dL (ref <5)

## 2015-02-21 LAB — LACTIC ACID, PLASMA: Lactic Acid, Venous: 2.4 mmol/L (ref 0.5–2.0)

## 2015-02-21 LAB — MRSA PCR SCREENING: MRSA BY PCR: NEGATIVE

## 2015-02-21 LAB — POC URINE PREG, ED: Preg Test, Ur: NEGATIVE

## 2015-02-21 MED ORDER — SODIUM CHLORIDE 0.9 % IV SOLN: INTRAVENOUS | Status: DC

## 2015-02-21 MED ORDER — CEFAZOLIN SODIUM 1-5 GM-% IV SOLN
1.0000 g | Freq: Once | INTRAVENOUS | Status: AC
  Administered 2015-02-21: 1 g via INTRAVENOUS
  Filled 2015-02-21: qty 50

## 2015-02-21 MED ORDER — IOHEXOL 300 MG/ML  SOLN
80.0000 mL | Freq: Once | INTRAMUSCULAR | Status: AC | PRN
  Administered 2015-02-21: 80 mL via INTRAVENOUS

## 2015-02-21 MED ORDER — DEXTROSE-NACL 5-0.45 % IV SOLN
INTRAVENOUS | Status: DC
  Administered 2015-02-21: 12:00:00 via INTRAVENOUS

## 2015-02-21 MED ORDER — INSULIN REGULAR BOLUS VIA INFUSION
0.0000 [IU] | Freq: Three times a day (TID) | INTRAVENOUS | Status: DC
  Filled 2015-02-21: qty 10

## 2015-02-21 MED ORDER — ACETAMINOPHEN 650 MG RE SUPP: 650.0000 mg | Freq: Four times a day (QID) | RECTAL | Status: DC | PRN

## 2015-02-21 MED ORDER — ACETAMINOPHEN 325 MG PO TABS
650.0000 mg | ORAL_TABLET | Freq: Four times a day (QID) | ORAL | Status: DC | PRN
  Administered 2015-02-22 – 2015-02-28 (×3): 650 mg via ORAL
  Filled 2015-02-21 (×3): qty 2

## 2015-02-21 MED ORDER — CIPROFLOXACIN IN D5W 400 MG/200ML IV SOLN
400.0000 mg | Freq: Two times a day (BID) | INTRAVENOUS | Status: DC
  Administered 2015-02-21: 400 mg via INTRAVENOUS
  Filled 2015-02-21 (×2): qty 200

## 2015-02-21 MED ORDER — SODIUM CHLORIDE 0.9 % IJ SOLN
10.0000 mL | INTRAMUSCULAR | Status: DC | PRN
  Administered 2015-02-22: 10 mL
  Administered 2015-02-27 – 2015-02-28 (×3): 30 mL
  Filled 2015-02-21 (×4): qty 40

## 2015-02-21 MED ORDER — METOPROLOL TARTRATE 1 MG/ML IV SOLN: 5.0000 mg | Freq: Four times a day (QID) | INTRAVENOUS | Status: DC

## 2015-02-21 MED ORDER — ONDANSETRON HCL 4 MG/2ML IJ SOLN
4.0000 mg | Freq: Once | INTRAMUSCULAR | Status: AC
  Administered 2015-02-21: 4 mg via INTRAVENOUS
  Filled 2015-02-21: qty 2

## 2015-02-21 MED ORDER — FOLIC ACID 5 MG/ML IJ SOLN
1.0000 mg | Freq: Every day | INTRAMUSCULAR | Status: DC
  Administered 2015-02-21 – 2015-03-01 (×9): 1 mg via INTRAVENOUS
  Filled 2015-02-21 (×9): qty 0.2

## 2015-02-21 MED ORDER — SODIUM CHLORIDE 0.9 % IJ SOLN
3.0000 mL | Freq: Two times a day (BID) | INTRAMUSCULAR | Status: DC
  Administered 2015-02-21 – 2015-03-02 (×13): 3 mL via INTRAVENOUS

## 2015-02-21 MED ORDER — SODIUM CHLORIDE 0.9 % IV SOLN
INTRAVENOUS | Status: AC
  Administered 2015-02-21: 2.8 [IU]/h via INTRAVENOUS
  Filled 2015-02-21: qty 2.5

## 2015-02-21 MED ORDER — POTASSIUM CHLORIDE IN NACL 20-0.9 MEQ/L-% IV SOLN
INTRAVENOUS | Status: DC
  Administered 2015-02-21: 1000 mL via INTRAVENOUS
  Administered 2015-02-21 – 2015-02-22 (×2): via INTRAVENOUS
  Administered 2015-02-22: 150 mL via INTRAVENOUS
  Administered 2015-02-22 – 2015-02-25 (×5): via INTRAVENOUS
  Filled 2015-02-21 (×17): qty 1000

## 2015-02-21 MED ORDER — PROMETHAZINE HCL 25 MG/ML IJ SOLN
25.0000 mg | Freq: Four times a day (QID) | INTRAMUSCULAR | Status: DC | PRN
  Administered 2015-02-21 – 2015-03-02 (×24): 25 mg via INTRAVENOUS
  Filled 2015-02-21 (×27): qty 1

## 2015-02-21 MED ORDER — PROMETHAZINE HCL 25 MG/ML IJ SOLN
25.0000 mg | Freq: Once | INTRAMUSCULAR | Status: AC
  Administered 2015-02-21: 25 mg via INTRAMUSCULAR

## 2015-02-21 MED ORDER — POTASSIUM CHLORIDE 10 MEQ/100ML IV SOLN
10.0000 meq | INTRAVENOUS | Status: DC
  Administered 2015-02-21 (×2): 10 meq via INTRAVENOUS
  Filled 2015-02-21 (×2): qty 100

## 2015-02-21 MED ORDER — METOPROLOL TARTRATE 1 MG/ML IV SOLN
5.0000 mg | Freq: Four times a day (QID) | INTRAVENOUS | Status: DC
  Administered 2015-02-21 – 2015-02-26 (×21): 5 mg via INTRAVENOUS
  Filled 2015-02-21 (×25): qty 5

## 2015-02-21 MED ORDER — BRINZOLAMIDE 1 % OP SUSP
1.0000 [drp] | Freq: Three times a day (TID) | OPHTHALMIC | Status: DC
  Administered 2015-02-21 – 2015-03-02 (×28): 1 [drp] via OPHTHALMIC
  Filled 2015-02-21: qty 10

## 2015-02-21 MED ORDER — POTASSIUM CHLORIDE 10 MEQ/100ML IV SOLN
10.0000 meq | INTRAVENOUS | Status: AC
  Administered 2015-02-21 (×4): 10 meq via INTRAVENOUS
  Filled 2015-02-21 (×4): qty 100

## 2015-02-21 MED ORDER — ONDANSETRON HCL 4 MG/2ML IJ SOLN
4.0000 mg | Freq: Once | INTRAMUSCULAR | Status: AC | PRN
  Administered 2015-02-21: 4 mg via INTRAVENOUS
  Filled 2015-02-21: qty 2

## 2015-02-21 MED ORDER — SODIUM CHLORIDE 0.9 % IV BOLUS (SEPSIS)
1000.0000 mL | Freq: Once | INTRAVENOUS | Status: AC
  Administered 2015-02-21: 1000 mL via INTRAVENOUS

## 2015-02-21 MED ORDER — METRONIDAZOLE IN NACL 5-0.79 MG/ML-% IV SOLN
500.0000 mg | Freq: Three times a day (TID) | INTRAVENOUS | Status: DC
  Administered 2015-02-22 – 2015-02-24 (×8): 500 mg via INTRAVENOUS
  Filled 2015-02-21 (×9): qty 100

## 2015-02-21 MED ORDER — IOHEXOL 300 MG/ML  SOLN
25.0000 mL | Freq: Once | INTRAMUSCULAR | Status: AC | PRN
  Administered 2015-02-21: 25 mL via ORAL

## 2015-02-21 MED ORDER — THIAMINE HCL 100 MG/ML IJ SOLN
100.0000 mg | Freq: Every day | INTRAMUSCULAR | Status: DC
  Administered 2015-02-21 – 2015-03-01 (×9): 100 mg via INTRAVENOUS
  Filled 2015-02-21 (×10): qty 1

## 2015-02-21 MED ORDER — METRONIDAZOLE IN NACL 5-0.79 MG/ML-% IV SOLN
500.0000 mg | Freq: Three times a day (TID) | INTRAVENOUS | Status: DC
  Administered 2015-02-21: 500 mg via INTRAVENOUS
  Filled 2015-02-21 (×4): qty 100

## 2015-02-21 MED ORDER — INSULIN ASPART 100 UNIT/ML ~~LOC~~ SOLN
0.0000 [IU] | SUBCUTANEOUS | Status: DC
  Administered 2015-02-22: 2 [IU] via SUBCUTANEOUS
  Administered 2015-02-22 (×2): 8 [IU] via SUBCUTANEOUS
  Administered 2015-02-23: 3 [IU] via SUBCUTANEOUS
  Administered 2015-02-23: 2 [IU] via SUBCUTANEOUS
  Administered 2015-02-23 – 2015-02-24 (×4): 5 [IU] via SUBCUTANEOUS
  Administered 2015-02-24: 3 [IU] via SUBCUTANEOUS
  Administered 2015-02-25 (×2): 5 [IU] via SUBCUTANEOUS
  Administered 2015-02-25 (×2): 3 [IU] via SUBCUTANEOUS
  Administered 2015-02-25 – 2015-02-26 (×2): 5 [IU] via SUBCUTANEOUS
  Administered 2015-02-26 – 2015-02-27 (×2): 3 [IU] via SUBCUTANEOUS
  Administered 2015-02-27: 5 [IU] via SUBCUTANEOUS
  Administered 2015-02-27: 2 [IU] via SUBCUTANEOUS
  Administered 2015-02-27: 3 [IU] via SUBCUTANEOUS
  Administered 2015-02-28: 5 [IU] via SUBCUTANEOUS
  Administered 2015-02-28: 3 [IU] via SUBCUTANEOUS
  Administered 2015-02-28: 5 [IU] via SUBCUTANEOUS
  Administered 2015-02-28: 3 [IU] via SUBCUTANEOUS
  Administered 2015-02-28: 5 [IU] via SUBCUTANEOUS
  Administered 2015-03-01: 2 [IU] via SUBCUTANEOUS
  Administered 2015-03-01: 5 [IU] via SUBCUTANEOUS
  Administered 2015-03-01: 8 [IU] via SUBCUTANEOUS
  Administered 2015-03-02 (×3): 3 [IU] via SUBCUTANEOUS

## 2015-02-21 MED ORDER — CIPROFLOXACIN IN D5W 400 MG/200ML IV SOLN
400.0000 mg | Freq: Two times a day (BID) | INTRAVENOUS | Status: DC
  Administered 2015-02-22 – 2015-02-24 (×5): 400 mg via INTRAVENOUS
  Filled 2015-02-21 (×6): qty 200

## 2015-02-21 MED ORDER — METRONIDAZOLE IN NACL 5-0.79 MG/ML-% IV SOLN
500.0000 mg | Freq: Three times a day (TID) | INTRAVENOUS | Status: DC
  Filled 2015-02-21: qty 100

## 2015-02-21 MED ORDER — MORPHINE SULFATE 2 MG/ML IJ SOLN
1.0000 mg | INTRAMUSCULAR | Status: DC | PRN
  Administered 2015-02-21 – 2015-02-25 (×20): 1 mg via INTRAVENOUS
  Filled 2015-02-21 (×20): qty 1

## 2015-02-21 MED ORDER — ONDANSETRON HCL 4 MG/2ML IJ SOLN
4.0000 mg | INTRAMUSCULAR | Status: DC
  Administered 2015-02-22 – 2015-02-23 (×6): 4 mg via INTRAVENOUS
  Filled 2015-02-21 (×7): qty 2

## 2015-02-21 MED ORDER — INSULIN GLARGINE 100 UNIT/ML ~~LOC~~ SOLN
16.0000 [IU] | Freq: Two times a day (BID) | SUBCUTANEOUS | Status: DC
  Administered 2015-02-21 – 2015-02-22 (×2): 16 [IU] via SUBCUTANEOUS
  Filled 2015-02-21 (×3): qty 0.16

## 2015-02-21 MED ORDER — SODIUM CHLORIDE 0.9 % IJ SOLN
10.0000 mL | Freq: Two times a day (BID) | INTRAMUSCULAR | Status: DC
  Administered 2015-02-21: 10 mL
  Administered 2015-02-22: 20 mL
  Administered 2015-02-22 – 2015-03-02 (×10): 10 mL

## 2015-02-21 MED ORDER — FENTANYL CITRATE (PF) 100 MCG/2ML IJ SOLN
25.0000 ug | Freq: Once | INTRAMUSCULAR | Status: AC
  Administered 2015-02-21: 25 ug via INTRAVENOUS
  Filled 2015-02-21: qty 2

## 2015-02-21 MED ORDER — FENTANYL CITRATE (PF) 100 MCG/2ML IJ SOLN
50.0000 ug | Freq: Once | INTRAMUSCULAR | Status: DC
  Filled 2015-02-21: qty 2

## 2015-02-21 MED ORDER — ENOXAPARIN SODIUM 40 MG/0.4ML ~~LOC~~ SOLN
40.0000 mg | SUBCUTANEOUS | Status: DC
  Administered 2015-02-21 – 2015-03-01 (×8): 40 mg via SUBCUTANEOUS
  Filled 2015-02-21 (×10): qty 0.4

## 2015-02-21 MED ORDER — CIPROFLOXACIN IN D5W 400 MG/200ML IV SOLN
400.0000 mg | Freq: Two times a day (BID) | INTRAVENOUS | Status: DC
  Filled 2015-02-21: qty 200

## 2015-02-21 MED ORDER — NALOXONE HCL 1 MG/ML IJ SOLN
INTRAMUSCULAR | Status: AC
  Filled 2015-02-21: qty 2

## 2015-02-21 MED ORDER — HYDROMORPHONE HCL 1 MG/ML IJ SOLN
1.0000 mg | Freq: Once | INTRAMUSCULAR | Status: AC
  Administered 2015-02-21: 1 mg via INTRAVENOUS
  Filled 2015-02-21: qty 1

## 2015-02-21 MED ORDER — DEXTROSE 50 % IV SOLN
25.0000 mL | INTRAVENOUS | Status: DC | PRN
  Administered 2015-02-22: 25 mL via INTRAVENOUS

## 2015-02-21 NOTE — ED Provider Notes (Signed)
  Physical Exam  BP 161/90 mmHg  Pulse 106  Temp(Src) 97.8 F (36.6 C) (Oral)  Resp 24  Ht 5' (1.524 m)  Wt 190 lb (86.183 kg)  BMI 37.11 kg/m2  SpO2 100%  LMP 01/12/2015  Physical Exam  ED Course  Procedures  MDM Care assumed at sign out from Dr. Elesa Massed. Patient here with vomiting, diarrhea since yesterday. Did drink wine yesterday but denies chronic alcohol use. Patient takes tylenol but denies overdose. Initial labs showed acute renal failure, hypokalemia, AG 19. Has diffuse ab pain so CT ab/pel ordered and pending at sign out. CT unremarkable. Still tender RUQ. Was briefly hypoxic before sign out from narcotics and given narcan. ETOH neg. UA + UTI, given ceftriaxone. K supplemented. I don't understand why she has AG acidosis. Alcohol neg and not chronic drinker so I doubt AKA. Denies overdose. Will add on tylenol, salicylate. Can be from renal failure from dehydration. Will admit for hydration, further workup.   Richardean Canal, MD 02/21/15 [phone]

## 2015-02-21 NOTE — ED Notes (Signed)
Phlebotomist and Admitting at bedside.

## 2015-02-21 NOTE — ED Notes (Signed)
Patient states she only consumed wine cooler.

## 2015-02-21 NOTE — ED Notes (Signed)
Phlebotomy at the bedside  

## 2015-02-21 NOTE — ED Notes (Signed)
Dr. Elesa Massed called to the bedside as patient's ekg has changes, and is lethargic, o2 sats 52%. Applied nonrebreather, administered 1mg  of narcan. Patient became alert, o2 sats increased to 100%, repeat ekg obtained. Informed MD that ethanol level obtained.

## 2015-02-21 NOTE — Progress Notes (Signed)
Peripherally Inserted Central Catheter/Midline Placement  The IV Nurse has discussed with the patient and/or persons authorized to consent for the patient, the purpose of this procedure and the potential benefits and risks involved with this procedure.  The benefits include less needle sticks, lab draws from the catheter and patient may be discharged home with the catheter.  Risks include, but not limited to, infection, bleeding, blood clot (thrombus formation), and puncture of an artery; nerve damage and irregular heat beat.  Alternatives to this procedure were also discussed.  PICC/Midline Placement Documentation  PICC Triple Lumen 02/21/15 PICC Right Brachial 37 cm 1 cm (Active)  Indication for Insertion or Continuance of Line Chronic illness with exacerbations (CF, Sickle Cell, etc.);Limited venous access - need for IV therapy >5 days (PICC only);Poor Vasculature-patient has had multiple peripheral attempts or PIVs lasting less than 24 hours 02/21/2015  5:10 PM  Exposed Catheter (cm) 1 cm 02/21/2015  5:10 PM  Site Assessment Clean;Dry;Intact 02/21/2015  5:10 PM  Lumen #1 Status Flushed;Saline locked;Blood return noted 02/21/2015  5:10 PM  Lumen #2 Status Flushed;Saline locked;Blood return noted 02/21/2015  5:10 PM  Lumen #3 Status Flushed;Saline locked;Blood return noted 02/21/2015  5:10 PM  Dressing Type Transparent 02/21/2015  5:10 PM  Dressing Status Clean;Dry;Intact;Antimicrobial disc in place 02/21/2015  5:10 PM  Line Care Connections checked and tightened 02/21/2015  5:10 PM  Line Adjustment (NICU/IV Team Only) No 02/21/2015  5:10 PM  Dressing Intervention New dressing 02/21/2015  5:10 PM  Dressing Change Due 02/28/15 02/21/2015  5:10 PM       Elliot Dally 02/21/2015, 5:11 PM

## 2015-02-21 NOTE — H&P (Signed)
[facility] History and Physical                                                                                    [name] [name], is a 49 y.o. female  MRN: [id number]   DOB - [birth year]  Admit Date - 02/21/2015  Outpatient Primary MD for the patient is Lonia Blood, MD  Referring MD: Silverio Lay / ER  With History of -  Past Medical History  Diagnosis Date  . Hypertension   . Sebaceous cyst   . Anemia   . Allergic rhinitis   . Hypertension   . Diabetic retinopathy   . Acute osteomyelitis   . Esophageal ulcer   . Esophageal stenosis   . Gastroparesis   . Renal insufficiency   . GERD (gastroesophageal reflux disease)   . Hyperlipidemia   . Depression   . Anxiety   . Polysubstance abuse   . Esophagitis   . CAD (coronary artery disease) 12/12    s/p DES mid and distal RCA with 50% LAD  . Heart murmur   . Pneumonia 2012  . Orthopnoea   . Type II diabetes mellitus   . Inferior MI 01/20/2009    /notes on 12/19/2012, "that's the only one I've had" (12/19/2012)  . Acute myocardial infarction, unspecified site, initial episode of care   . Acute myocardial infarction of other lateral wall, initial episode of care   . Rheumatoid arthritis(714.0)   . Bipolar affective   . History of stomach ulcers   . Anginal pain     07/15/13- no chest pain in months"  . Stroke 2011    denies residual on 12/19/2012.  "Years ago"  . Daily headache     not daily  . Migraines       Past Surgical History  Procedure Laterality Date  . Irrigation and debridement sebaceous cyst Right 03/2011    "pointer" (12/19/2012)  . Coronary angioplasty with stent placement  01/20/2009    "2" (12/19/2012)  . Coronary angioplasty with stent placement  2012    "2" (12/19/2012)  . Coronary angioplasty with stent placement  12/19/2012    "2" (12/19/2012)  . Pars plana vitrectomy Left 07/16/2013    Procedure: PARS PLANA VITRECTOMY WITH 23 GAUGE;  Surgeon: Shade Flood, MD;  Location: [facility] OR;  Service: Ophthalmology;   Laterality: Left;  Marland Kitchen Membrane peel Left 07/16/2013    Procedure: MEMBRANE PEEL;  Surgeon: Shade Flood, MD;  Location: [facility] OR;  Service: Ophthalmology;  Laterality: Left;  . Photocoagulation with laser Left 07/16/2013    Procedure: PHOTOCOAGULATION WITH LASER;  Surgeon: Shade Flood, MD;  Location: [facility] OR;  Service: Ophthalmology;  Laterality: Left;  ENDOLASER  . Gas insertion Left 07/16/2013    Procedure: INSERTION OF GAS;  Surgeon: Shade Flood, MD;  Location: [facility] OR;  Service: Ophthalmology;  Laterality: Left;  SF6  . Pars plana vitrectomy Left 07/30/2013    Procedure: PARS PLANA VITRECTOMY WITH 23 GAUGE WITH ENDOLASER;  Surgeon: Shade Flood, MD;  Location: [facility] OR;  Service: Ophthalmology;  Laterality: Left;  with endolaser  . Gas/fluid exchange Left 07/30/2013    Procedure: GAS/FLUID EXCHANGE;  Surgeon: Shade Flood,  MD;  Location: [facility] OR;  Service: Ophthalmology;  Laterality: Left;  . Left heart catheterization with coronary angiogram N/A 07/06/2011    Procedure: LEFT HEART CATHETERIZATION WITH CORONARY ANGIOGRAM;  Surgeon: Corky Crafts, MD;  Location: [facility] CATH LAB;  Service: Cardiovascular;  Laterality: N/A;  possible PCI  . Percutaneous coronary stent intervention (pci-s) N/A 07/06/2011    Procedure: PERCUTANEOUS CORONARY STENT INTERVENTION (PCI-S);  Surgeon: Corky Crafts, MD;  Location: [facility] CATH LAB;  Service: Cardiovascular;  Laterality: N/A;  . Left heart catheterization with coronary angiogram N/A 12/19/2012    Procedure: LEFT HEART CATHETERIZATION WITH CORONARY ANGIOGRAM;  Surgeon: Corky Crafts, MD;  Location: [facility] CATH LAB;  Service: Cardiovascular;  Laterality: N/A;    in for   Chief Complaint  Patient presents with  . Nausea  . Emesis  . Diarrhea     HPI This is a 49 year old female patient with known diabetes with associated nephropathy/CKD III and gastroparesis, hypertension, single-vessel coronary artery disease with prior NSTEMI s/p DES May 2014, rheumatoid  arthritis, apparent bipolar affective disorder currently not on medications, history of polysubstance abuse in the past, & hyperlipidemia who presented to the ER with acute onset of intractable nausea and vomiting with diarrhea. These symptoms were associated with sharp/ diffuse abdominal pain and began around 9 PM on 7/23. Patient endorses watery diarrhea with greater than 20 episodes of emesis and diarrhea since onset of symptoms. Patient denied consuming food she had not prepared, foods containing mayonnaise or raw seafood. She denied fevers or chills. She denied blood in emesis or stool. She denied consuming homemade alcoholic beverages although she reported she did have some wine yesterday evening.  In the ER patient was afebrile, initially with an increased respiratory rate which has improved to normal, she has remained mildly tachycardic with heart rates between 103 and 115, she is remained normotensive with blood pressures between 132/71 and 158/81. She is maintained room air saturations of 100%. She has had 2 stools since arrival to the ER. She was noted to have marked electrolyte abnormalities with sodium 140, potassium 2.8 and chloride 99, BUN normal at 20 but creatinine elevated at 1.72, glucose elevated at 292, lipase 20, anion gap 19, alkaline phosphatase 162, total bilirubin normal. White count was also elevated at 12,800, hemoglobin was elevated for this patient 15.6 with baseline hemoglobin around 12.9. Pulmonary and EKG were negative. Lactic acid was elevated at 2.58. Pregnancy was negative. Urinalysis appeared consistent with a UTI as well as dehydration with a small amount of bilirubin, 100 of glucose, moderate hemoglobin, 40 ketones, moderate leukocytes, greater than 300 protein, 11-20 WBCs. CT of the abdomen and pelvis was unremarkable and unrevealing.   Review of Systems   In addition to the HPI above,  No Fever-chills, myalgias or other constitutional symptoms No Headache, changes  with Vision or hearing, new weakness, tingling, numbness in any extremity, No problems swallowing food or Liquids, indigestion/reflux No Chest pain, Cough or Shortness of Breath, palpitations, orthopnea or DOE No melena or hematochezia, no dark tarry stools No dysuria, hematuria or flank pain No new skin rashes, lesions, masses or bruises, No new joints pains-aches No recent weight gain or loss No polyuria, polydypsia or polyphagia,  *A full 10 point Review of Systems was done, except as stated above, all other Review of Systems were negative.  Social History History  Substance Use Topics  . Smoking status: Current Every Day Smoker -- 0.50 packs/day for 22 years    Types: Cigarettes  .  Smokeless tobacco: Never Used  . Alcohol Use: Yes     Comment: 12/31/2012 "1 cooler/wk since mother's day; none since last week; clean 6 years before that"    Resides at: Private residence  Lives with: Alone  Ambulatory status: Without assistive devices   Family History Family History  Problem Relation Age of Onset  . Breast cancer Mother 61  . Hypertension Father   . Stomach cancer Sister   . Hypertension Sister   . Colon cancer Neg Hx      Prior to Admission medications   Medication Sig Start Date End Date Taking? Authorizing Provider  acetaminophen (TYLENOL) 500 MG tablet Take 500 mg by mouth every 6 (six) hours as needed for moderate pain.    Yes Historical Provider, MD  brinzolamide (AZOPT) 1 % ophthalmic suspension Place 1 drop into the left eye 3 (three) times daily.    Yes Historical Provider, MD  ferrous sulfate 325 (65 FE) MG tablet Take 325 mg by mouth daily with breakfast.    Yes Historical Provider, MD  furosemide (LASIX) 40 MG tablet TAKE 1 TABLET (40 MG TOTAL) BY MOUTH DAILY. 11/23/14  Yes Corky Crafts, MD  insulin aspart (NOVOLOG) 100 UNIT/ML injection Inject 4-16 Units into the skin 3 (three) times daily with meals. Sliding scale CBG 100-150; 4 units, 151-200; 6 units,  201-250; 8 units, 251-300; 10 units, 301-350; 12 units, 351-400; 14 units, > 401; call doctor   Yes Historical Provider, MD  insulin glargine (LANTUS) 100 UNIT/ML injection Inject 16 Units into the skin 2 (two) times daily.    Yes Historical Provider, MD  metoprolol (LOPRESSOR) 50 MG tablet Take 50 mg by mouth 2 (two) times daily.   Yes Historical Provider, MD  nitroGLYCERIN (NITROSTAT) 0.4 MG SL tablet Place 0.4 mg under the tongue every 5 (five) minutes as needed for chest pain.    Yes Historical Provider, MD  ondansetron (ZOFRAN) 4 MG tablet Take 4 mg by mouth every 6 (six) hours as needed for nausea. 01/01/13  Yes Estela Isaiah Blakes, MD  pantoprazole (PROTONIX) 40 MG tablet Take 40 mg by mouth 2 (two) times daily.   Yes Historical Provider, MD  sertraline (ZOLOFT) 100 MG tablet Take 100 mg by mouth daily.    Yes Historical Provider, MD  traMADol (ULTRAM) 50 MG tablet Take 50 mg by mouth every 6 (six) hours as needed for pain.    Yes Historical Provider, MD    Allergies  Allergen Reactions  . Aspirin Other (See Comments)    Stomach bleeds  . Codeine Hives and Itching    Physical Exam  Vitals  Blood pressure 152/72, pulse 102, temperature 97.8 F (36.6 C), temperature source Oral, resp. rate 18, height 5' (1.524 m), weight 190 lb (86.183 kg), last menstrual period 01/12/2015, SpO2 98 %.   General:  In moderate acute distress evidenced by ongoing abdominal pain with nausea vomiting and diarrhea  Psych:  Normal affect, Denies Suicidal or Homicidal ideations, Awake Alert, Oriented X 3. Speech and thought patterns are clear and appropriate, no apparent short term memory deficits  Neuro:   No focal neurological deficits, CN II through XII intact, Strength 5/5 all 4 extremities, Sensation intact all 4 extremities.  ENT:  Ears and Eyes appear Normal, Conjunctivae clear, PER. Her dry oral mucosa without erythema or exudates.  Neck:  Supple, No lymphadenopathy  appreciated  Respiratory:  Symmetrical chest wall movement, Good air movement bilaterally, CTAB. Room Air  Cardiac:  RRR,  No Murmurs, no LE edema noted, no JVD, No carotid bruits, peripheral pulses palpable at 2+  Abdomen:  Positive bowel sounds, Soft, minimal diffuse tenderness without guarding or rebounding, Non distended,  No masses appreciated, no obvious hepatosplenomegaly  Skin:  No Cyanosis, Normal Skin Turgor, No Skin Rash or Bruise.  Extremities: Symmetrical without obvious trauma or injury,  no effusions.  Data Review  CBC  Recent Labs Lab 02/21/15 0357  WBC 12.8*  HGB 15.6*  HCT 45.9  PLT 400  MCV 80.0  MCH 27.2  MCHC 34.0  RDW 17.1*    Chemistries   Recent Labs Lab 02/21/15 0357  NA 140  K 2.8*  CL 99*  CO2 22  GLUCOSE 276*  BUN 20  CREATININE 1.72*  CALCIUM 9.9  AST 28  ALT 9*  ALKPHOS 162*  BILITOT 0.9    estimated creatinine clearance is 39 mL/min (by C-G formula based on Cr of 1.72).  No results for input(s): TSH, T4TOTAL, T3FREE, THYROIDAB in the last 72 hours.  Invalid input(s): FREET3  Coagulation profile No results for input(s): INR, PROTIME in the last 168 hours.  No results for input(s): DDIMER in the last 72 hours.  Cardiac Enzymes No results for input(s): CKMB, TROPONINI, MYOGLOBIN in the last 168 hours.  Invalid input(s): CK  Invalid input(s): POCBNP  Urinalysis    Component Value Date/Time   COLORURINE YELLOW 02/21/2015 0432   APPEARANCEUR CLEAR 02/21/2015 0432   LABSPEC 1.022 02/21/2015 0432   PHURINE 6.0 02/21/2015 0432   GLUCOSEU 100* 02/21/2015 0432   HGBUR MODERATE* 02/21/2015 0432   HGBUR trace-intact 08/25/2010 0841   BILIRUBINUR SMALL* 02/21/2015 0432   KETONESUR 40* 02/21/2015 0432   PROTEINUR >300* 02/21/2015 0432   UROBILINOGEN 1.0 02/21/2015 0432   NITRITE NEGATIVE 02/21/2015 0432   LEUKOCYTESUR MODERATE* 02/21/2015 0432    Imaging results:   Ct Abdomen Pelvis W Contrast  02/21/2015   CLINICAL  DATA:  Nausea, vomiting, diarrhea.  Diffuse abdominal pain.  EXAM: CT ABDOMEN AND PELVIS WITH CONTRAST  TECHNIQUE: Multidetector CT imaging of the abdomen and pelvis was performed using the standard protocol following bolus administration of intravenous contrast.  CONTRAST:  21mL OMNIPAQUE IOHEXOL 300 MG/ML  SOLN  COMPARISON:  02/11/2011  FINDINGS: Lower chest: Presumed cardiac stent partly visualized. Trace dependent pleural fluid and adjacent atelectasis or scarring noted.  Hepatobiliary: Liver and gallbladder are unremarkable.  Pancreas: Atrophic. No mass or pancreatic ductal dilatation visualized.  Spleen: Normal, with splenule is incidentally noted.  Adrenals/Urinary Tract: Lobulated left renal contour is identified compatible with scarring. Nonobstructing bilateral renal calculi noted measuring 2-3 mm. No radiopaque ureteral or bladder calculus. No hydroureteronephrosis.  Stomach/Bowel: No bowel wall thickening or focal segmental dilatation is identified.  Vascular/Lymphatic: Moderate atheromatous aortic calcification without aneurysm. No lymphadenopathy. Small retroperitoneal nodes are identified, largest representative periaortic node measuring 0.6 cm short axis diameter image 33.  Reproductive: Lobulated uterine contour with internal coarse calcifications compatible with fibroids. Ovaries are unremarkable.  Other: The appendix is normal.  No free air or fluid.  Musculoskeletal: No acute osseous abnormality.  IMPRESSION: No acute intra-abdominal or pelvic pathology.   Electronically Signed   By: Christiana Pellant M.D.   On: 02/21/2015 08:32     EKG: (Independently reviewed) sinus tachycardia with r-r1 pattern concerning for incomplete right bundle branch block versus underlying right ventricular hypertrophy, this is associated with elevated J-point in the inferior lateral leads, this EKG has only subtly changed when compared with previous EKG 2015  Assessment & Plan  Principal Problem:   DKA  /Uncontrolled type 2 diabetes mellitus with insulin therapy -Admit to stepdown -DKA very mild noting only 100 of glucose in urine although this may be affected by patient's degree of dehydration -Suspect elevated lactic acid and elevated AG likely more reflective of volume depletion and acute renal failure in setting of GI losses -Suspect precipitated by infectious processes likely combination of suspected UTI with gastroenteritis symptoms; treat underlying causes -We'll utilize IV insulin with glucose stabilizer protocol until GI symptoms improved i.e. nausea vomiting diarrhea resolved -Chek HgbA1c -Because of acidosis, EDP checking Tylenol and aspirin level of the patient denied intentional overdose or extra use of either of those medications; alcohol level was less than 5  Active Problems:   Nausea vomiting and diarrhea -Unclear if stand alone problem related to viral gastroenteritis or precipitated by presumed UTI -Possible bacterial etiology so agree with C. difficile PCR as ordered by ER although patient reports no recent antibiotics or hospitalizations -Empiric IV Flagyl -NPO and bowel rest -Symptom management with scheduled Zofran and when necessary Phenergan    Severe dehydration with hypokalemia -Utilize normal saline IV fluids with potassium -Patient received IV potassium boluses in the ER -Follow-up on BMETs ordered while on glucose stabilizer   Mild chronic systolic heart failure EF 40-45% in 2014 -Holding preadmission Lasix -Check follow-up echocardiogram    Acute renal failure on CKD, stage III -Review of previous admission for gastroenteritis demonstrated similar pattern of elevated anion gap metabolic acidosis while profoundly dehydrated -Treat as above -Follow labs    Acute UTI -Given dose of Ancef in ER -Since has associated GI symptoms that are not fully clarified we'll utilize Cipro and Flagyl IV for now -Follow up on cultures -CT scan without evidence of  pyelonephritis but patient does have nonobstructing bilateral renal calculi measuring 2-3 mm -Patient continues to complain of abdominal pain so we'll continue IV morphine when necessary -Does not appear septic but since lactic acid elevated will briefly cycle/follow    HTN -Currently controlled -While NPO utilize scheduled IV Lopressor    Rheumatoid arthritis -Not on immunosuppressants/DMARDs prior to admission    Diabetic gastroparesis -Patient reports quiescent prior to onset of current symptoms    History of NSTEMI w/ DES to cfx May 2014 -No cardiac ischemic symptoms reported and current troponin and EKG unremarkable except for possibility of right ventricular hypertrophy based on voltage criteria  -Continue beta blocker as above    Anemia -Baseline hemoglobin around 12.9 and current hemoglobin 15.6 consistent with hemoconcentration from profound volume depletion -Preadmission iron on hold    ESOPHAGEAL STENOSIS/GERD -Patient currently denying symptoms -Preadmission PPI on hold in setting of diarrhea and need to rule out C. difficile colitis    Tobacco abuse -Counseled on cessation    Hyperlipidemia -Not on medications prior to admission    DVT Prophylaxis: Lovenox  Family Communication:   No family at bedside  Code Status:  Full code  Condition:  Stable  Discharge disposition: Anticipate discharge back to home in next 48-72 hours pending resolution of GI symptoms, correction of anion gap acidosis and renal failure  Time spent in minutes : 60      ELLIS,ALLISON L. ANP on 02/21/2015 at 10:05 AM  Between 7am to 7pm - Pager - [phone]  After 7pm go to [email or web address] - password [email or web address]  And look for the night coverage person covering me after hours  [facility]  I have taken an interval history, reviewed the  chart and examined the patient. I agree with the Advanced Practice Provider's note, impression and recommendations. I have made any necessary  editorial changes. 49 year old female with a history of diabetes mellitus, hypertension, C KD stage III, CAD who is being admitted with mild DKA nausea vomiting diarrhea and UTI. We'll start the patient on IV insulin glucose stabilizer, only has small AG. IV antibiotic. IV fluids.  Monitor the patient in stepdown unit.

## 2015-02-21 NOTE — ED Notes (Signed)
Per EMS, the pateint reports n/v/d since 9pm. She requests pain medication. zofran given for vomiting with ems. From home. VS with ems 158/90, p 100, cbg 183. 22g in left wrist.

## 2015-02-21 NOTE — ED Provider Notes (Signed)
TIME SEEN: 5:30 AM  CHIEF COMPLAINT: Nausea, vomiting, diarrhea  HPI: Pt is a 49 y.o. female with history of hypertension, diabetes, coronary artery disease who presents to the emergency department with complaints of nausea, vomiting and diarrhea that started at 9 PM yesterday. She is also complaining of sharp, diffuse abdominal pain that is moderate in nature without radiation. No aggravating or alleviating factors. Denies fever. Denies sick contacts or recent travel. Denies abdominal surgeries but states she's had her esophagus dilated before.  PCP is August Luz  ROS: See HPI Constitutional: no fever  Eyes: no drainage  ENT: no runny nose   Cardiovascular:  no chest pain  Resp: no SOB  GI:  vomiting GU: no dysuria Integumentary: no rash  Allergy: no hives  Musculoskeletal: no leg swelling  Neurological: no slurred speech ROS otherwise negative  PAST MEDICAL HISTORY/PAST SURGICAL HISTORY:  Past Medical History  Diagnosis Date  . Hypertension   . Sebaceous cyst   . Anemia   . Allergic rhinitis   . Hypertension   . Diabetic retinopathy   . Acute osteomyelitis   . Esophageal ulcer   . Esophageal stenosis   . Gastroparesis   . Renal insufficiency   . GERD (gastroesophageal reflux disease)   . Hyperlipidemia   . Depression   . Anxiety   . Polysubstance abuse   . Esophagitis   . CAD (coronary artery disease) 12/12    s/p DES mid and distal RCA with 50% LAD  . Heart murmur   . Pneumonia 2012  . Orthopnoea   . Type II diabetes mellitus   . Inferior MI 01/20/2009    /notes on 12/19/2012, "that's the only one I've had" (12/19/2012)  . Acute myocardial infarction, unspecified site, initial episode of care   . Acute myocardial infarction of other lateral wall, initial episode of care   . Rheumatoid arthritis(714.0)   . Bipolar affective   . History of stomach ulcers   . Anginal pain     07/15/13- no chest pain in months"  . Stroke 2011    denies residual on 12/19/2012.  "Years  ago"  . Daily headache     not daily  . Migraines     MEDICATIONS:  Prior to Admission medications   Medication Sig Start Date End Date Taking? Authorizing Provider  acetaminophen (TYLENOL) 500 MG tablet Take 500 mg by mouth every 6 (six) hours as needed for moderate pain.     Historical Provider, MD  brinzolamide (AZOPT) 1 % ophthalmic suspension Place 1 drop into the left eye 3 (three) times daily.     Historical Provider, MD  ferrous sulfate 325 (65 FE) MG tablet Take 325 mg by mouth daily with breakfast.     Historical Provider, MD  furosemide (LASIX) 40 MG tablet TAKE 1 TABLET (40 MG TOTAL) BY MOUTH DAILY. 11/23/14   Corky Crafts, MD  insulin aspart (NOVOLOG) 100 UNIT/ML injection Inject 4-16 Units into the skin 3 (three) times daily with meals. Sliding scale CBG 100-150; 4 units, 151-200; 6 units, 201-250; 8 units, 251-300; 10 units, 301-350; 12 units, 351-400; 14 units, > 401; call doctor    Historical Provider, MD  insulin glargine (LANTUS) 100 UNIT/ML injection Inject 16 Units into the skin 2 (two) times daily.     Historical Provider, MD  metoprolol (LOPRESSOR) 50 MG tablet Take 50 mg by mouth 2 (two) times daily.    Historical Provider, MD  nitroGLYCERIN (NITROSTAT) 0.4 MG SL tablet Place 0.4 mg  under the tongue every 5 (five) minutes as needed for chest pain.     Historical Provider, MD  ondansetron (ZOFRAN) 4 MG tablet Take 4 mg by mouth every 6 (six) hours as needed for nausea. 01/01/13   Henderson Cloud, MD  pantoprazole (PROTONIX) 40 MG tablet Take 40 mg by mouth 2 (two) times daily.    Historical Provider, MD  sertraline (ZOLOFT) 100 MG tablet Take 100 mg by mouth daily.     Historical Provider, MD  traMADol (ULTRAM) 50 MG tablet Take 50 mg by mouth every 6 (six) hours as needed for pain.     Historical Provider, MD    ALLERGIES:  Allergies  Allergen Reactions  . Aspirin Other (See Comments)    Stomach bleeds  . Codeine Hives and Itching    SOCIAL  HISTORY:  History  Substance Use Topics  . Smoking status: Current Every Day Smoker -- 0.50 packs/day for 22 years    Types: Cigarettes  . Smokeless tobacco: Never Used  . Alcohol Use: Yes     Comment: 12/31/2012 "1 cooler/wk since mother's day; none since last week; clean 6 years before that"    FAMILY HISTORY: Family History  Problem Relation Age of Onset  . Breast cancer Mother 42  . Hypertension Father   . Stomach cancer Sister   . Hypertension Sister   . Colon cancer Neg Hx     EXAM: BP 158/81 mmHg  Temp(Src) 97.8 F (36.6 C) (Oral)  Resp 29  Ht 5' (1.524 m)  Wt 190 lb (86.183 kg)  BMI 37.11 kg/m2  SpO2 100%  LMP 01/12/2015 CONSTITUTIONAL: Alert and oriented and responds appropriately to questions. Patient appears uncomfortable, actively vomiting HEAD: Normocephalic EYES: Conjunctivae clear, PERRL ENT: normal nose; no rhinorrhea; dry mucous membranes; pharynx without lesions noted NECK: Supple, no meningismus, no LAD  CARD: Regular and minimally tachycardic; S1 and S2 appreciated; no murmurs, no clicks, no rubs, no gallops RESP: Normal chest excursion without splinting or tachypnea; breath sounds clear and equal bilaterally; no wheezes, no rhonchi, no rales, no hypoxia or respiratory distress, speaking full sentences ABD/GI: Normal bowel sounds; non-distended; soft, tender to palpation throughout her abdomen without focality, no rebound, no guarding, no peritoneal signs, negative Murphy sign, minimally tender to palpation at McBurney's point but is tender everywhere BACK:  The back appears normal and is non-tender to palpation, there is no CVA tenderness EXT: Normal ROM in all joints; non-tender to palpation; no edema; normal capillary refill; no cyanosis, no calf tenderness or swelling    SKIN: Normal color for age and race; warm NEURO: Moves all extremities equally, sensation to light touch intact diffusely, cranial nerves II through XII intact PSYCH: The patient's mood  and manner are appropriate. Grooming and personal hygiene are appropriate.  MEDICAL DECISION MAKING: Patient here with nausea, vomiting and diarrhea. She does appear dehydrated on exam and is slightly tachycardic but not hypotensive. We'll give IV fluids. Labs show leukocytosis which appears chronic for patient. She is also hypokalemic and has a slightly prolonged QT that appears new. Will replace with IV potassium given she is unable to tolerate oral potassium. Creatinine is mildly elevated at 1.72. She is receiving IV fluids. Urine shows moderate hemoglobin and moderate leukocytes but she is not having urinary symptoms. Urine culture is pending. Given her diffuse abdominal pain, intractable vomiting, will obtain a CT of her abdomen and pelvis.  ED PROGRESS: Called into room by nursing staff. There is concern that she  had ST elevation on the monitor. EKG shows. Depression that is similar compared to prior EKG in 2000. Do not think she is having an MI. She denies chest pressure, shortness of breath. She was also initially very drowsy likely from recent dose of Dilaudid. Had to receive Narcan in the ED. Now awake, alert, protecting airway, no hypoxia. Patient now smells more of alcohol that she did previously. Will obtain an ethanol level. We'll also obtain a troponin. She is receiving IV fluids, IV potassium. Suspect she will need admission for dehydration, acute renal failure, hypokalemia but given her abdominal tenderness on exam CT scan is pending to rule out surgical pathology. Discussed with Dr. Silverio Lay to follow-up on CT scan and admit patient.    EKG Interpretation  Date/Time:  Sunday February 21 2015 06:20:25 EDT Ventricular Rate:  104 PR Interval:  169 QRS Duration: 103 QT Interval:  388 QTC Calculation: 510 R Axis:   87 Text Interpretation:  Sinus tachycardia Ventricular premature complex Consider RVH w/ secondary repol abnormality Probable left ventricular hypertrophy Inferior infarct, age  indeterminate Lateral leads are also involved Prolonged QT interval Confirmed by Elesa Massed,  DO, KRISTEN [phone]) on 02/21/2015 6:25:55 AM       EKG Interpretation  Date/Time:  Sunday February 21 2015 07:05:41 EDT Ventricular Rate:  100 PR Interval:  168 QRS Duration: 102 QT Interval:  349 QTC Calculation: 450 R Axis:   87 Text Interpretation:  Sinus tachycardia Consider right ventricular hypertrophy PR depression Lateral leads are also involved No significant change since last tracing since 2000 Confirmed by WARD,  DO, KRISTEN [phone]) on 02/21/2015 7:11:42 AM        [name] Maw Ward, DO 02/21/15 8832

## 2015-02-22 ENCOUNTER — Ambulatory Visit (HOSPITAL_COMMUNITY): Payer: Medicaid Other | Attending: Nurse Practitioner

## 2015-02-22 ENCOUNTER — Inpatient Hospital Stay (HOSPITAL_COMMUNITY): Payer: Medicaid Other

## 2015-02-22 DIAGNOSIS — I509 Heart failure, unspecified: Secondary | ICD-10-CM | POA: Diagnosis not present

## 2015-02-22 DIAGNOSIS — E785 Hyperlipidemia, unspecified: Secondary | ICD-10-CM | POA: Insufficient documentation

## 2015-02-22 DIAGNOSIS — F17200 Nicotine dependence, unspecified, uncomplicated: Secondary | ICD-10-CM | POA: Insufficient documentation

## 2015-02-22 DIAGNOSIS — E860 Dehydration: Secondary | ICD-10-CM

## 2015-02-22 DIAGNOSIS — Z720 Tobacco use: Secondary | ICD-10-CM

## 2015-02-22 DIAGNOSIS — I10 Essential (primary) hypertension: Secondary | ICD-10-CM | POA: Insufficient documentation

## 2015-02-22 LAB — COMPREHENSIVE METABOLIC PANEL
ALT: 6 U/L — ABNORMAL LOW (ref 14–54)
ANION GAP: 4 — AB (ref 5–15)
AST: 18 U/L (ref 15–41)
Albumin: 2.7 g/dL — ABNORMAL LOW (ref 3.5–5.0)
Alkaline Phosphatase: 120 U/L (ref 38–126)
BUN: 9 mg/dL (ref 6–20)
CO2: 26 mmol/L (ref 22–32)
CREATININE: 0.84 mg/dL (ref 0.44–1.00)
Calcium: 8.2 mg/dL — ABNORMAL LOW (ref 8.9–10.3)
Chloride: 112 mmol/L — ABNORMAL HIGH (ref 101–111)
GFR calc Af Amer: >60 mL/min (ref >60)
Glucose, Bld: 115 mg/dL — ABNORMAL HIGH (ref 65–99)
POTASSIUM: 3.2 mmol/L — AB (ref 3.5–5.1)
SODIUM: 142 mmol/L (ref 135–145)
TOTAL PROTEIN: 6.6 g/dL (ref 6.5–8.1)
Total Bilirubin: 0.6 mg/dL (ref 0.3–1.2)

## 2015-02-22 LAB — GLUCOSE, CAPILLARY
GLUCOSE-CAPILLARY: 193 mg/dL — AB (ref 65–99)
GLUCOSE-CAPILLARY: 25 mg/dL — AB (ref 65–99)
GLUCOSE-CAPILLARY: 100 mg/dL — AB (ref 65–99)
Glucose-Capillary: 139 mg/dL — ABNORMAL HIGH (ref 65–99)
Glucose-Capillary: 187 mg/dL — ABNORMAL HIGH (ref 65–99)
Glucose-Capillary: 157 mg/dL — ABNORMAL HIGH (ref 65–99)
Glucose-Capillary: 104 mg/dL — ABNORMAL HIGH (ref 65–99)
Glucose-Capillary: 104 mg/dL — ABNORMAL HIGH (ref 65–99)
Glucose-Capillary: 132 mg/dL — ABNORMAL HIGH (ref 65–99)
Glucose-Capillary: 108 mg/dL — ABNORMAL HIGH (ref 65–99)
Glucose-Capillary: 305 mg/dL — ABNORMAL HIGH (ref 65–99)

## 2015-02-22 LAB — CBC
HEMATOCRIT: 36 % (ref 36.0–46.0)
Hemoglobin: 11.4 g/dL — ABNORMAL LOW (ref 12.0–15.0)
MCH: 26.6 pg (ref 26.0–34.0)
MCHC: 31.7 g/dL (ref 30.0–36.0)
MCV: 83.9 fL (ref 78.0–100.0)
Platelets: 312 10*3/uL (ref 150–400)
RBC: 4.29 MIL/uL (ref 3.87–5.11)
RDW: 17.8 % — ABNORMAL HIGH (ref 11.5–15.5)
WBC: 9.6 10*3/uL (ref 4.0–10.5)

## 2015-02-22 MED ORDER — METOCLOPRAMIDE HCL 5 MG/ML IJ SOLN
5.0000 mg | Freq: Four times a day (QID) | INTRAMUSCULAR | Status: DC
  Administered 2015-02-22 – 2015-02-24 (×8): 5 mg via INTRAVENOUS
  Filled 2015-02-22 (×13): qty 1

## 2015-02-22 MED ORDER — DEXTROSE 50 % IV SOLN
INTRAVENOUS | Status: AC
  Filled 2015-02-22: qty 50

## 2015-02-22 MED ORDER — INSULIN GLARGINE 100 UNIT/ML ~~LOC~~ SOLN
10.0000 [IU] | Freq: Two times a day (BID) | SUBCUTANEOUS | Status: DC
  Administered 2015-02-22 – 2015-03-01 (×14): 10 [IU] via SUBCUTANEOUS
  Filled 2015-02-22 (×15): qty 0.1

## 2015-02-22 MED ORDER — POTASSIUM CHLORIDE 10 MEQ/100ML IV SOLN
10.0000 meq | INTRAVENOUS | Status: AC
  Administered 2015-02-22 (×3): 10 meq via INTRAVENOUS
  Filled 2015-02-22 (×3): qty 100

## 2015-02-22 NOTE — Progress Notes (Signed)
  Echocardiogram 2D Echocardiogram has been performed.  [name] [name] 02/22/2015, 1:08 PM

## 2015-02-22 NOTE — Progress Notes (Signed)
Notified md about pt's potassium 3.2.  Will continue to monitor. [name] [name]

## 2015-02-22 NOTE — Progress Notes (Signed)
Pt c/o nausea and vomiting throughout shift. Pt given phenergan, and scheduled zofran. Pt a/so c/o abdominal pain, morphine given x1. Pt observed with dry heaving and crying, Dr. Roda Shutters paged.   Dr. Roda Shutters on floor and given update on pt's status. New orders received and carried out. Will monitor closely.

## 2015-02-22 NOTE — Progress Notes (Signed)
Results for Bruntz, Tilda (MRN [id number]) as of 02/22/2015 14:12  Ref. Range 02/21/2015 23:28 02/22/2015 04:03 02/22/2015 07:52 02/22/2015 11:28 02/22/2015 11:59  Glucose-Capillary Latest Ref Range: 65-99 mg/dL 468 (H) 032 (H) 122 (H) 25 (LL) 100 (H)  Noted that blood sugars less than 100 mg/dl.  Recommend decreasing Novolog correction scale to TID & HS if patient is eating.  May need to decrease to SENSITIVE if blood sugars continue to be less than 100 mg/dl.  Smith Mince RN BSN CDE

## 2015-02-22 NOTE — Progress Notes (Signed)
PROGRESS NOTE  [name] [name] NWG:[id number] DOB: 06-Aug-1965 DOA: 02/21/2015 PCP: Lonia Blood, MD  HPI/Recap of past 24 hours:  Off insulin drip, C/o ab pain, n/v. bp elevated  Assessment/Plan: Principal Problem:   DKA (diabetic ketoacidoses) Active Problems:   Uncontrolled type 2 diabetes mellitus with insulin therapy   Anemia   HTN (hypertension)   ESOPHAGEAL STENOSIS   GERD   Rheumatoid arthritis   Nausea vomiting and diarrhea   Diabetic gastroparesis   Tobacco abuse   Hyperlipidemia   History of NSTEMI w/ DES to cfx May 2014   Acute hypokalemia   Severe dehydration   Acute renal failure   Acute UTI   CKD (chronic kidney disease), stage III   Chronic systolic congestive heart failure, NYHA class 1  DKA /Uncontrolled type 2 diabetes mellitus with insulin therapy -resolved, off insulin drip, on SSI -Suspect precipitated by infectious processes likely combination of suspected UTI with gastroenteritis symptoms; treat underlying causes -Chek HgbA1c -Because of acidosis, EDP checking Tylenol and aspirin level of the patient denied intentional overdose or extra use of either of those medications; alcohol level was less than 5  Active Problems:  Nausea vomiting and ? diarrhea -likely combination of gastroparesis/uti+/-gastritis? Doubt c diff, no bm today, possible overflow diarrhea from chronic constipation. Will keep on flagyl for another 24hrs.  -add reglan, continue symptom management with scheduled Zofran and when necessary Phenergan -start clears, continue ivf   Severe dehydration with hypokalemia -Patient received IV potassium boluses in the ER -on normal saline IV fluids with potassium since admission    Mild chronic systolic heart 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   Acute renal failure on CKD, stage III -from UTI and prerenal due to dehydration -cr improved   Acute  UTI -Given dose of Ancef in ER -Since has associated GI symptoms that are not fully clarified we'll utilize Cipro and Flagyl IV for now -Follow up on cultures -CT scan without evidence of pyelonephritis but patient does have nonobstructing bilateral renal calculi measuring 2-3 mm, ua no RBC -Patient continues to complain of abdominal pain so we'll continue IV morphine when necessary -Does not appear septic but since lactic acid elevated will briefly cycle/follow   HTN - scheduled IV Lopressor, add prn hydralazine, will restart po meds  In the next 1-2 days when n/v improves   Rheumatoid arthritis -Not on immunosuppressants/DMARDs prior to admission   Diabetic gastroparesis -Patient reports quiescent prior to onset of current symptoms   History of NSTEMI w/ DES to cfx May 2014 -No cardiac ischemic symptoms reported and current troponin and EKG unremarkable except for possibility of right ventricular hypertrophy based on voltage criteria  -Continue beta blocker as above   Anemia -Baseline hemoglobin around 12.9 and current hemoglobin 15.6 consistent with hemoconcentration from profound volume depletion -Preadmission iron on hold   ESOPHAGEAL STENOSIS/GERD -Patient currently denying symptoms -Preadmission PPI on hold in setting of diarrhea and need to rule out C. difficile colitis   Tobacco abuse -Counseled on cessation   Hyperlipidemia -Not on medications prior to admission  Code Status: full  Family Communication: patient  Disposition Plan: remain in stepdown   Consultants:  none  Procedures:  PICC 7/24  Antibiotics:  cipro/flagyl   Objective: BP 172/93 mmHg  Pulse 90  Temp(Src) 98 F (36.7 C) (Oral)  Resp 21  Ht 5' (1.524 m)  Wt 84.2 kg (185 lb 10 oz)  BMI 36.25 kg/m2  SpO2 96%  LMP 01/12/2015  Intake/Output Summary (Last 24 hours) at 02/22/15 1717 Last data filed at 02/22/15 1500  Gross per 24 hour  Intake 4396.22 ml  Output   3075 ml    Net 1321.22 ml   Filed Weights   02/21/15 0323 02/21/15 1113  Weight: 86.183 kg (190 lb) 84.2 kg (185 lb 10 oz)    Exam:   General:  In moderate distress due to dry heave, ab pain, crying  Cardiovascular: RRR  Respiratory: CTABL  Abdomen: epigastric tender, no rebound, Soft/ND, positive BS  Musculoskeletal: No Edema  Neuro: aaox3  Data Reviewed: Basic Metabolic Panel:  Recent Labs Lab 02/21/15 0357 02/21/15 0939 02/21/15 1700 02/22/15 0440  NA 140 142 148* 142  K 2.8* 3.1* 3.0* 3.2*  CL 99* 101 114* 112*  CO2 22 24 27 26   GLUCOSE 276* 367* 149* 115*  BUN 20 20 15 9   CREATININE 1.72* 1.34* 0.99 0.84  CALCIUM 9.9 9.3 8.7* 8.2*   Liver Function Tests:  Recent Labs Lab 02/21/15 0357 02/22/15 0440  AST 28 18  ALT 9* 6*  ALKPHOS 162* 120  BILITOT 0.9 0.6  PROT 9.6* 6.6  ALBUMIN 4.0 2.7*    Recent Labs Lab 02/21/15 0357  LIPASE 20*   No results for input(s): AMMONIA in the last 168 hours. CBC:  Recent Labs Lab 02/21/15 0357 02/22/15 0440  WBC 12.8* 9.6  HGB 15.6* 11.4*  HCT 45.9 36.0  MCV 80.0 83.9  PLT 400 312   Cardiac Enzymes:   No results for input(s): CKTOTAL, CKMB, CKMBINDEX, TROPONINI in the last 168 hours. BNP (last 3 results) No results for input(s): BNP in the last 8760 hours.  ProBNP (last 3 results) No results for input(s): PROBNP in the last 8760 hours.  CBG:  Recent Labs Lab 02/22/15 0403 02/22/15 0752 02/22/15 1128 02/22/15 1159 02/22/15 1504  GLUCAP 104* 132* 25* 100* 108*    Recent Results (from the past 240 hour(s))  Urine culture     Status: None (Preliminary result)   Collection Time: 02/21/15  4:32 AM  Result Value Ref Range Status   Specimen Description URINE, RANDOM  Final   Special Requests NONE  Final   Culture >=100,000 COLONIES/mL GRAM NEGATIVE RODS  Final   Report Status PENDING  Incomplete  MRSA PCR Screening     Status: None   Collection Time: 02/21/15 12:22 PM  Result Value Ref Range Status    MRSA by PCR NEGATIVE NEGATIVE Final    Comment:        The GeneXpert MRSA Assay (FDA approved for NASAL specimens only), is one component of a comprehensive MRSA colonization surveillance program. It is not intended to diagnose MRSA infection nor to guide or monitor treatment for MRSA infections.      Studies: Dg Abd Portable 1v  02/22/2015   CLINICAL DATA:  Subsequent encounter for lower abdominal pain with distention and vomiting.  EXAM: PORTABLE ABDOMEN - 1 VIEW  COMPARISON:  CT scan from 02/21/2015.  FINDINGS: Supine portable film at 1630 hours shows no gaseous bowel dilatation to suggest obstruction. Visualized bony structures are unremarkable. Coarse calcification over the central anatomic pelvis compatible with fibroid disease as seen on the previous CT scan. Phleboliths overlie the inferior anatomic pelvis bilaterally.  IMPRESSION: No evidence for bowel obstruction.   Electronically Signed   By: Kennith Center M.D.   On: 02/22/2015 16:56    Scheduled Meds: . brinzolamide  1 drop Left Eye TID  .  ciprofloxacin  400 mg Intravenous Q12H  . dextrose      . enoxaparin (LOVENOX) injection  40 mg Subcutaneous Q24H  . folic acid  1 mg Intravenous Daily  . insulin aspart  0-15 Units Subcutaneous 6 times per day  . insulin glargine  10 Units Subcutaneous BID  . insulin regular  0-10 Units Intravenous TID WC  . metoCLOPramide (REGLAN) injection  5 mg Intravenous 4 times per day  . metoprolol  5 mg Intravenous 4 times per day  . metronidazole  500 mg Intravenous Q8H  . ondansetron (ZOFRAN) IV  4 mg Intravenous Q4H  . sodium chloride  10-40 mL Intracatheter Q12H  . sodium chloride  3 mL Intravenous Q12H  . thiamine  100 mg Intravenous Daily    Continuous Infusions: . 0.9 % NaCl with KCl 20 mEq / L 150 mL/hr at 02/22/15 1519     Time spent:  Lianni Kanaan MD, PhD  [facility] Pager [phone]. If 7PM-7AM, please contact night-coverage at [email or web address], password  [facility] 02/22/2015, 5:17 PM  LOS: 1 day

## 2015-02-22 NOTE — Care Management Note (Signed)
Adm w dka. Lives w fam. pcp dr lawal Mikeal Hawthorne. Ur review done.

## 2015-02-23 ENCOUNTER — Encounter (HOSPITAL_COMMUNITY): Payer: Self-pay | Admitting: *Deleted

## 2015-02-23 DIAGNOSIS — I252 Old myocardial infarction: Secondary | ICD-10-CM

## 2015-02-23 DIAGNOSIS — K222 Esophageal obstruction: Secondary | ICD-10-CM

## 2015-02-23 DIAGNOSIS — M069 Rheumatoid arthritis, unspecified: Secondary | ICD-10-CM

## 2015-02-23 LAB — GLUCOSE, CAPILLARY
GLUCOSE-CAPILLARY: 141 mg/dL — AB (ref 65–99)
Glucose-Capillary: 129 mg/dL — ABNORMAL HIGH (ref 65–99)
Glucose-Capillary: 133 mg/dL — ABNORMAL HIGH (ref 65–99)
Glucose-Capillary: 151 mg/dL — ABNORMAL HIGH (ref 65–99)
Glucose-Capillary: 203 mg/dL — ABNORMAL HIGH (ref 65–99)
Glucose-Capillary: 205 mg/dL — ABNORMAL HIGH (ref 65–99)
Glucose-Capillary: 262 mg/dL — ABNORMAL HIGH (ref 65–99)

## 2015-02-23 LAB — COMPREHENSIVE METABOLIC PANEL
ALBUMIN: 3.2 g/dL — AB (ref 3.5–5.0)
ALT: 8 U/L — ABNORMAL LOW (ref 14–54)
ANION GAP: 11 (ref 5–15)
AST: 22 U/L (ref 15–41)
Alkaline Phosphatase: 136 U/L — ABNORMAL HIGH (ref 38–126)
BUN: 6 mg/dL (ref 6–20)
CO2: 25 mmol/L (ref 22–32)
Calcium: 9 mg/dL (ref 8.9–10.3)
Chloride: 102 mmol/L (ref 101–111)
Creatinine, Ser: 0.75 mg/dL (ref 0.44–1.00)
GFR calc Af Amer: >60 mL/min (ref >60)
GFR calc non Af Amer: >60 mL/min (ref >60)
GLUCOSE: 133 mg/dL — AB (ref 65–99)
Potassium: 3.6 mmol/L (ref 3.5–5.1)
SODIUM: 138 mmol/L (ref 135–145)
TOTAL PROTEIN: 7.8 g/dL (ref 6.5–8.1)
Total Bilirubin: 0.7 mg/dL (ref 0.3–1.2)

## 2015-02-23 LAB — CBC
HCT: 42.2 % (ref 36.0–46.0)
Hemoglobin: 13.9 g/dL (ref 12.0–15.0)
MCH: 27.3 pg (ref 26.0–34.0)
MCHC: 32.9 g/dL (ref 30.0–36.0)
MCV: 82.9 fL (ref 78.0–100.0)
Platelets: 357 10*3/uL (ref 150–400)
RBC: 5.09 MIL/uL (ref 3.87–5.11)
RDW: 17.3 % — AB (ref 11.5–15.5)
WBC: 14.8 10*3/uL — ABNORMAL HIGH (ref 4.0–10.5)

## 2015-02-23 LAB — URINE CULTURE

## 2015-02-23 LAB — LACTIC ACID, PLASMA: Lactic Acid, Venous: 1 mmol/L (ref 0.5–2.0)

## 2015-02-23 MED ORDER — ONDANSETRON HCL 4 MG/2ML IJ SOLN
4.0000 mg | Freq: Three times a day (TID) | INTRAMUSCULAR | Status: DC | PRN
  Administered 2015-02-24 – 2015-03-01 (×5): 4 mg via INTRAVENOUS
  Filled 2015-02-23 (×5): qty 2

## 2015-02-23 NOTE — Progress Notes (Signed)
Received report

## 2015-02-23 NOTE — Progress Notes (Signed)
Pt was transferred to 2W-6 in stable condition. Report given to June @ 1812. All questions/concerns answered. Belongings with patient. Family accompanying patient. Plan of care to be continued upon arrival to the floor.

## 2015-02-23 NOTE — Progress Notes (Signed)
PROGRESS NOTE  [name] [name] VVO:[id number] DOB: 1966/07/30 DOA: 02/21/2015 PCP: Lonia Blood, MD  HPI/Recap of past 24 hours:  Reported not feeling well,  C/o persistent ab pain, n/v. bp elevated Reported bmx1  Assessment/Plan: Principal Problem:   DKA (diabetic ketoacidoses) Active Problems:   Uncontrolled type 2 diabetes mellitus with insulin therapy   Anemia   HTN (hypertension)   ESOPHAGEAL STENOSIS   GERD   Rheumatoid arthritis   Nausea vomiting and diarrhea   Diabetic gastroparesis   Tobacco abuse   Hyperlipidemia   History of NSTEMI w/ DES to cfx May 2014   Acute hypokalemia   Severe dehydration   Acute renal failure   Acute UTI   CKD (chronic kidney disease), stage III   Chronic systolic congestive heart failure, NYHA class 1  DKA /Uncontrolled type 2 diabetes mellitus with insulin therapy -Suspect precipitated by infectious processes likely combination of suspected UTI with gastroenteritis symptoms; treat underlying causes -HgbA1c pending -resolved, off insulin drip, on lantus/SSI  Lactic acidosis: resolved.  Negative acetaminophen level, negative salicylate level.   Active Problems:  Nausea vomiting and ? diarrhea -likely combination of gastroparesis/uti+/-gastritis? Doubt c diff, no bm today, possible overflow diarrhea from chronic constipation. Will keep on flagyl for another 24hrs.  -add reglan, continue symptom management with scheduled Zofran and when necessary Phenergan -continue clears, continue ivf at reduced rate   Severe dehydration with hypokalemia -Patient received IV potassium boluses in the ER -on normal saline IV fluids with potassium since admission   Mild chronic systolic heart 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   Acute renal failure on CKD, stage III -from UTI and prerenal due to dehydration -cr on admission 1.72, normalized.    Acute  UTI -Given dose of Ancef in ER -CT scan without evidence of pyelonephritis but patient does have nonobstructing bilateral renal calculi measuring 2-3 mm, ua no RBC -Patient continues to complain of abdominal pain so we'll continue IV morphine when necessary -continue cipro/flagyl for another 24hrs   HTN - scheduled IV Lopressor, add prn hydralazine, will restart po meds  In the next 1-2 days when n/v improves   Rheumatoid arthritis -Not on immunosuppressants/DMARDs prior to admission   Diabetic gastroparesis -Patient reports quiescent prior to onset of current symptoms   History of NSTEMI w/ DES to cfx May 2014 -No cardiac ischemic symptoms reported and current troponin and EKG unremarkable except for possibility of right ventricular hypertrophy based on voltage criteria  -Continue beta blocker as above   Anemia -Baseline hemoglobin around 12.9 and current hemoglobin 15.6 consistent with hemoconcentration from profound volume depletion -Preadmission iron on hold   ESOPHAGEAL STENOSIS/GERD -Patient currently denying symptoms -Preadmission PPI on hold in setting of diarrhea and need to rule out C. difficile colitis   Tobacco abuse -Counseled on cessation   Hyperlipidemia -Not on medications prior to admission  Code Status: full  Family Communication: patient  Disposition Plan: transfer to tele   Consultants:  none  Procedures:  PICC 7/24  Antibiotics:  cipro/flagyl   Objective: BP 173/89 mmHg  Pulse 105  Temp(Src) 98.7 F (37.1 C) (Oral)  Resp 27  Ht 5' (1.524 m)  Wt 84.2 kg (185 lb 10 oz)  BMI 36.25 kg/m2  SpO2 95%  LMP 01/12/2015  Intake/Output Summary (Last 24 hours) at 02/23/15 1542 Last data filed at 02/23/15 1539  Gross per 24 hour  Intake   2920 ml  Output  3601 ml  Net   -681 ml   Filed Weights   02/21/15 0323 02/21/15 1113  Weight: 86.183 kg (190 lb) 84.2 kg (185 lb 10 oz)    Exam:   General:  Look better than yesterday,  though patient report did not feel any better  Cardiovascular: sinus tachycardia  Respiratory: CTABL  Abdomen: epigastric tender, no rebound, Soft/ND, positive BS  Musculoskeletal: No Edema  Neuro: aaox3  Data Reviewed: Basic Metabolic Panel:  Recent Labs Lab 02/21/15 0357 02/21/15 0939 02/21/15 1700 02/22/15 0440 02/23/15 0400  NA 140 142 148* 142 138  K 2.8* 3.1* 3.0* 3.2* 3.6  CL 99* 101 114* 112* 102  CO2 22 24 27 26 25   GLUCOSE 276* 367* 149* 115* 133*  BUN 20 20 15 9 6   CREATININE 1.72* 1.34* 0.99 0.84 0.75  CALCIUM 9.9 9.3 8.7* 8.2* 9.0   Liver Function Tests:  Recent Labs Lab 02/21/15 0357 02/22/15 0440 02/23/15 0400  AST 28 18 22   ALT 9* 6* 8*  ALKPHOS 162* 120 136*  BILITOT 0.9 0.6 0.7  PROT 9.6* 6.6 7.8  ALBUMIN 4.0 2.7* 3.2*    Recent Labs Lab 02/21/15 0357  LIPASE 20*   No results for input(s): AMMONIA in the last 168 hours. CBC:  Recent Labs Lab 02/21/15 0357 02/22/15 0440 02/23/15 0400  WBC 12.8* 9.6 14.8*  HGB 15.6* 11.4* 13.9  HCT 45.9 36.0 42.2  MCV 80.0 83.9 82.9  PLT 400 312 357   Cardiac Enzymes:   No results for input(s): CKTOTAL, CKMB, CKMBINDEX, TROPONINI in the last 168 hours. BNP (last 3 results) No results for input(s): BNP in the last 8760 hours.  ProBNP (last 3 results) No results for input(s): PROBNP in the last 8760 hours.  CBG:  Recent Labs Lab 02/22/15 2022 02/22/15 2342 02/23/15 0359 02/23/15 0755 02/23/15 1221  GLUCAP 305* 262* 129* 151* 203*    Recent Results (from the past 240 hour(s))  Urine culture     Status: None   Collection Time: 02/21/15  4:32 AM  Result Value Ref Range Status   Specimen Description URINE, RANDOM  Final   Special Requests NONE  Final   Culture >=100,000 COLONIES/mL CITROBACTER KOSERI  Final   Report Status 02/23/2015 FINAL  Final   Organism ID, Bacteria CITROBACTER KOSERI  Final      Susceptibility   Citrobacter koseri - MIC*    CEFAZOLIN <=4 SENSITIVE Sensitive      CEFTRIAXONE <=1 SENSITIVE Sensitive     CIPROFLOXACIN <=0.25 SENSITIVE Sensitive     GENTAMICIN <=1 SENSITIVE Sensitive     IMIPENEM <=0.25 SENSITIVE Sensitive     NITROFURANTOIN 32 SENSITIVE Sensitive     TRIMETH/SULFA <=20 SENSITIVE Sensitive     PIP/TAZO <=4 SENSITIVE Sensitive     * >=100,000 COLONIES/mL CITROBACTER KOSERI  MRSA PCR Screening     Status: None   Collection Time: 02/21/15 12:22 PM  Result Value Ref Range Status   MRSA by PCR NEGATIVE NEGATIVE Final    Comment:        The GeneXpert MRSA Assay (FDA approved for NASAL specimens only), is one component of a comprehensive MRSA colonization surveillance program. It is not intended to diagnose MRSA infection nor to guide or monitor treatment for MRSA infections.      Studies: Dg Abd Portable 1v  02/22/2015   CLINICAL DATA:  Subsequent encounter for lower abdominal pain with distention and vomiting.  EXAM: PORTABLE ABDOMEN - 1 VIEW  COMPARISON:  CT scan from 02/21/2015.  FINDINGS: Supine portable film at 1630 hours shows no gaseous bowel dilatation to suggest obstruction. Visualized bony structures are unremarkable. Coarse calcification over the central anatomic pelvis compatible with fibroid disease as seen on the previous CT scan. Phleboliths overlie the inferior anatomic pelvis bilaterally.  IMPRESSION: No evidence for bowel obstruction.   Electronically Signed   By: Kennith Center M.D.   On: 02/22/2015 16:56    Scheduled Meds: . brinzolamide  1 drop Left Eye TID  . ciprofloxacin  400 mg Intravenous Q12H  . enoxaparin (LOVENOX) injection  40 mg Subcutaneous Q24H  . folic acid  1 mg Intravenous Daily  . insulin aspart  0-15 Units Subcutaneous 6 times per day  . insulin glargine  10 Units Subcutaneous BID  . insulin regular  0-10 Units Intravenous TID WC  . metoCLOPramide (REGLAN) injection  5 mg Intravenous 4 times per day  . metoprolol  5 mg Intravenous 4 times per day  . metronidazole  500 mg Intravenous Q8H   . ondansetron (ZOFRAN) IV  4 mg Intravenous Q4H  . sodium chloride  10-40 mL Intracatheter Q12H  . sodium chloride  3 mL Intravenous Q12H  . thiamine  100 mg Intravenous Daily    Continuous Infusions: . 0.9 % NaCl with KCl 20 mEq / L 150 mL/hr at 02/23/15 1300   Changed to 75cc/hr on 7/26  Time spent:  Alashia Brownfield MD, PhD  [facility] Pager [phone]. If 7PM-7AM, please contact night-coverage at [email or web address], password [facility] 02/23/2015, 3:42 PM  LOS: 2 days

## 2015-02-23 NOTE — Progress Notes (Signed)
Pt arrived to unit. Pt complains of pain. Given pain medication. Oriented to room and unit.

## 2015-02-24 DIAGNOSIS — R1110 Vomiting, unspecified: Secondary | ICD-10-CM

## 2015-02-24 LAB — COMPREHENSIVE METABOLIC PANEL
ALBUMIN: 3.2 g/dL — AB (ref 3.5–5.0)
ALT: 8 U/L — ABNORMAL LOW (ref 14–54)
ANION GAP: 10 (ref 5–15)
AST: 16 U/L (ref 15–41)
Alkaline Phosphatase: 129 U/L — ABNORMAL HIGH (ref 38–126)
BUN: 6 mg/dL (ref 6–20)
CHLORIDE: 101 mmol/L (ref 101–111)
CO2: 24 mmol/L (ref 22–32)
Calcium: 8.8 mg/dL — ABNORMAL LOW (ref 8.9–10.3)
Creatinine, Ser: 0.91 mg/dL (ref 0.44–1.00)
GFR calc Af Amer: >60 mL/min (ref >60)
GFR calc non Af Amer: >60 mL/min (ref >60)
GLUCOSE: 204 mg/dL — AB (ref 65–99)
POTASSIUM: 3.6 mmol/L (ref 3.5–5.1)
Sodium: 135 mmol/L (ref 135–145)
TOTAL PROTEIN: 8 g/dL (ref 6.5–8.1)
Total Bilirubin: 1 mg/dL (ref 0.3–1.2)

## 2015-02-24 LAB — CBC
HEMATOCRIT: 43.6 % (ref 36.0–46.0)
Hemoglobin: 14.2 g/dL (ref 12.0–15.0)
MCH: 27.2 pg (ref 26.0–34.0)
MCHC: 32.6 g/dL (ref 30.0–36.0)
MCV: 83.5 fL (ref 78.0–100.0)
Platelets: 308 10*3/uL (ref 150–400)
RBC: 5.22 MIL/uL — AB (ref 3.87–5.11)
RDW: 17.6 % — AB (ref 11.5–15.5)
WBC: 12.2 10*3/uL — ABNORMAL HIGH (ref 4.0–10.5)

## 2015-02-24 LAB — GLUCOSE, CAPILLARY
GLUCOSE-CAPILLARY: 128 mg/dL — AB (ref 65–99)
GLUCOSE-CAPILLARY: 214 mg/dL — AB (ref 65–99)
GLUCOSE-CAPILLARY: 181 mg/dL — AB (ref 65–99)
Glucose-Capillary: 261 mg/dL — ABNORMAL HIGH (ref 65–99)
Glucose-Capillary: 223 mg/dL — ABNORMAL HIGH (ref 65–99)
Glucose-Capillary: 109 mg/dL — ABNORMAL HIGH (ref 65–99)

## 2015-02-24 LAB — HEMOGLOBIN A1C
HEMOGLOBIN A1C: 7.5 % — AB (ref 4.8–5.6)
Mean Plasma Glucose: 169 mg/dL

## 2015-02-24 LAB — MAGNESIUM: MAGNESIUM: 1.5 mg/dL — AB (ref 1.7–2.4)

## 2015-02-24 MED ORDER — SODIUM CHLORIDE 0.9 % IV SOLN
10.0000 mg | Freq: Two times a day (BID) | INTRAVENOUS | Status: DC
  Administered 2015-02-24 – 2015-02-28 (×9): 10 mg via INTRAVENOUS
  Filled 2015-02-24 (×11): qty 1

## 2015-02-24 MED ORDER — METOCLOPRAMIDE HCL 5 MG/ML IJ SOLN
10.0000 mg | Freq: Four times a day (QID) | INTRAMUSCULAR | Status: DC
  Administered 2015-02-24 – 2015-02-25 (×3): 10 mg via INTRAVENOUS
  Filled 2015-02-24 (×7): qty 2

## 2015-02-24 MED ORDER — PANTOPRAZOLE SODIUM 40 MG IV SOLR
40.0000 mg | INTRAVENOUS | Status: DC
  Administered 2015-02-24 – 2015-02-25 (×2): 40 mg via INTRAVENOUS
  Filled 2015-02-24 (×3): qty 40

## 2015-02-24 MED ORDER — BOOST / RESOURCE BREEZE PO LIQD
1.0000 | Freq: Three times a day (TID) | ORAL | Status: DC
  Administered 2015-02-24 – 2015-02-26 (×2): 1 via ORAL

## 2015-02-24 MED ORDER — CEFTRIAXONE SODIUM IN DEXTROSE 20 MG/ML IV SOLN
1.0000 g | INTRAVENOUS | Status: DC
  Administered 2015-02-24: 1 g via INTRAVENOUS
  Filled 2015-02-24 (×2): qty 50

## 2015-02-24 NOTE — Progress Notes (Addendum)
Initial Nutrition Assessment  DOCUMENTATION CODES:   Obesity unspecified  INTERVENTION:   Boost Breeze po TID, each supplement provides 250 kcal and 9 grams of protein  NUTRITION DIAGNOSIS:   Inadequate oral intake related to nausea, vomiting (abdominal pain) as evidenced by per patient/family report  GOAL:   Patient will meet greater than or equal to 90% of their needs  MONITOR:   PO intake, Supplement acceptance, Weight trends, Labs, I & O's  REASON FOR ASSESSMENT:   Malnutrition Screening Tool  ASSESSMENT:   49 yo Female with known DM with associated nephropathy/CKD III and gastroparesis, HTN, CAD, with prior NSTEMI s/p DES May 2014 and polysubstance abuse in the past who presented to the ER with acute onset of intractable nausea and vomiting with diarrhea.  Pt reported a poor appetite since last Saturday, 7/23 then suddenly started c/o severe abdominal pain.  RD unable to complete nutrition hx interview.  RN notified.  Per H&P, pt also with N/V/D.  Currently on Clear Liquids.  Will order Boost Breeze clear liquid supplement to help meet kcal, protein needs.  Limited Nutrition Focused Physical Exam completed.  No muscle or subcutaneous fat depletion noticed.  Diet Order:  Diet clear liquid Room service appropriate?: Yes; Fluid consistency:: Thin  Skin:  Reviewed, no issues  Last BM:  7/26  Height:   Ht Readings from Last 1 Encounters:  02/21/15 5' (1.524 m)    Weight:   Wt Readings from Last 1 Encounters:  02/24/15 183 lb 3.2 oz (83.1 kg)    Ideal Body Weight:  45.4 kg  BMI:  Body mass index is 35.78 kg/(m^2).  Estimated Nutritional Needs:   Kcal:  1800-2000  Protein:  90-100 gm  Fluid:  1.8-2.0 L  EDUCATION NEEDS:   No education needs identified at this time  Maureen Chatters, RD, LDN Pager #: [phone] After-Hours Pager #: [phone]

## 2015-02-24 NOTE — Progress Notes (Signed)
PROGRESS NOTE  [name] [name] MWN:[id number] DOB: 01-Jun-1966 DOA: 02/21/2015 PCP: Lonia Blood, MD  HPI/Recap of past 24 hours:  Reported not feeling well,  C/o persistent ab pain, n/v. bp elevated Reported no diarrhea  Assessment/Plan: Principal Problem:   DKA (diabetic ketoacidoses) Active Problems:   Uncontrolled type 2 diabetes mellitus with insulin therapy   Anemia   HTN (hypertension)   ESOPHAGEAL STENOSIS   GERD   Rheumatoid arthritis   Nausea vomiting and diarrhea   Diabetic gastroparesis   Tobacco abuse   Hyperlipidemia   History of NSTEMI w/ DES to cfx May 2014   Acute hypokalemia   Severe dehydration   Acute renal failure   Acute UTI   CKD (chronic kidney disease), stage III   Chronic systolic congestive heart failure, NYHA class 1  DKA /Uncontrolled type 2 diabetes mellitus with insulin therapy -Suspect precipitated by infectious processes likely combination of suspected UTI with gastroenteritis symptoms; treat underlying causes -HgbA1c 7.5 -resolved, off insulin drip, initially admitted to stepdown, no on tele floor -on lantus/SSI  Lactic acidosis: resolved.  Negative acetaminophen level, negative salicylate level.   Active Problems:  intractable Nausea vomiting  -likely combination of gastroparesis/uti+/-gastritis? Doubt c diff, no bm today, possible overflow diarrhea from chronic constipation. Was on flagyl for 48hrs.  -increase reglan, continue symptom management with scheduled Zofran and when necessary Phenergan -continue clears, continue ivf at reduced rate    Severe dehydration with hypokalemia -Patient received IV potassium boluses in the ER -on normal saline IV fluids with potassium since admission   Mild chronic systolic heart 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   Acute renal failure on CKD, stage III -from UTI and prerenal due to dehydration -cr on  admission 1.72, normalized.    Acute UTI --CT scan without evidence of pyelonephritis but patient does have nonobstructing bilateral renal calculi measuring 2-3 mm, ua no RBC -urine culture +citrobacter, pansensitivy -Given dose of Ancef in ER, received iv cipro/flagyl for 48hrs -changed to rocephin since 7/27   HTN - scheduled IV Lopressor, add prn hydralazine, will restart po meds   when n/v improves   Rheumatoid arthritis -Not on immunosuppressants/DMARDs prior to admission   Diabetic gastroparesis -iv reglan   History of NSTEMI w/ DES to cfx May 2014 -No cardiac ischemic symptoms reported and current troponin and EKG unremarkable except for possibility of right ventricular hypertrophy based on voltage criteria  -Continue beta blocker as above   h/o Anemia -Baseline hemoglobin around 12.9 and current hemoglobin 15.6 consistent with hemoconcentration from profound volume depletion -Preadmission iron on hold   ESOPHAGEAL STENOSIS/GERD ( reported h/o of EGD with dilation by Dr. Madilyn Fireman) -Preadmission PPI held initially due to suspicion of  C. difficile colitis -no diarrhea in the hospital, ppi restarted due to significant epigastric pain -GI consulted due to persistent epigastric pain, intractable n/v, barium pill swallow study ordered per Dr. Randa Evens   Tobacco abuse -Counseled on cessation   Hyperlipidemia -Not on medications prior to admission  Code Status: full  Family Communication: patient  Disposition Plan: transfer to tele   Consultants:  [facility]  Procedures:  PICC 7/24  Antibiotics:  Cipro/flagyl from admission to 7/26  Rocephin from 7/27   Objective: BP 155/96 mmHg  Pulse 121  Temp(Src) 98.6 F (37 C) (Oral)  Resp 18  Ht 5' (1.524 m)  Wt 83.1 kg (183 lb 3.2 oz)  BMI 35.78 kg/m2  SpO2 98%  LMP 01/12/2015  Intake/Output Summary (Last 24 hours) at 02/24/15 1710 Last data filed at 02/24/15 0800  Gross per 24 hour  Intake 2192.5  ml  Output      0 ml  Net 2192.5 ml   Filed Weights   02/21/15 0323 02/21/15 1113 02/24/15 0503  Weight: 86.183 kg (190 lb) 84.2 kg (185 lb 10 oz) 83.1 kg (183 lb 3.2 oz)    Exam:   General:  Look better than yesterday, though patient report did not feel any better  Cardiovascular: sinus tachycardia  Respiratory: CTABL  Abdomen: epigastric tender, no rebound, Soft/ND, positive BS  Musculoskeletal: No Edema  Neuro: aaox3  Data Reviewed: Basic Metabolic Panel:  Recent Labs Lab 02/21/15 0939 02/21/15 1700 02/22/15 0440 02/23/15 0400 02/24/15 0555  NA 142 148* 142 138 135  K 3.1* 3.0* 3.2* 3.6 3.6  CL 101 114* 112* 102 101  CO2 24 27 26 25 24   GLUCOSE 367* 149* 115* 133* 204*  BUN 20 15 9 6 6   CREATININE 1.34* 0.99 0.84 0.75 0.91  CALCIUM 9.3 8.7* 8.2* 9.0 8.8*  MG  --   --   --   --  1.5*   Liver Function Tests:  Recent Labs Lab 02/21/15 0357 02/22/15 0440 02/23/15 0400 02/24/15 0555  AST 28 18 22 16   ALT 9* 6* 8* 8*  ALKPHOS 162* 120 136* 129*  BILITOT 0.9 0.6 0.7 1.0  PROT 9.6* 6.6 7.8 8.0  ALBUMIN 4.0 2.7* 3.2* 3.2*    Recent Labs Lab 02/21/15 0357  LIPASE 20*   No results for input(s): AMMONIA in the last 168 hours. CBC:  Recent Labs Lab 02/21/15 0357 02/22/15 0440 02/23/15 0400 02/24/15 0555  WBC 12.8* 9.6 14.8* 12.2*  HGB 15.6* 11.4* 13.9 14.2  HCT 45.9 36.0 42.2 43.6  MCV 80.0 83.9 82.9 83.5  PLT 400 312 357 308   Cardiac Enzymes:   No results for input(s): CKTOTAL, CKMB, CKMBINDEX, TROPONINI in the last 168 hours. BNP (last 3 results) No results for input(s): BNP in the last 8760 hours.  ProBNP (last 3 results) No results for input(s): PROBNP in the last 8760 hours.  CBG:  Recent Labs Lab 02/24/15 0449 02/24/15 0816 02/24/15 1044 02/24/15 1238 02/24/15 1638  GLUCAP 128* 214* 261* 223* 181*    Recent Results (from the past 240 hour(s))  Urine culture     Status: None   Collection Time: 02/21/15  4:32 AM  Result  Value Ref Range Status   Specimen Description URINE, RANDOM  Final   Special Requests NONE  Final   Culture >=100,000 COLONIES/mL CITROBACTER KOSERI  Final   Report Status 02/23/2015 FINAL  Final   Organism ID, Bacteria CITROBACTER KOSERI  Final      Susceptibility   Citrobacter koseri - MIC*    CEFAZOLIN <=4 SENSITIVE Sensitive     CEFTRIAXONE <=1 SENSITIVE Sensitive     CIPROFLOXACIN <=0.25 SENSITIVE Sensitive     GENTAMICIN <=1 SENSITIVE Sensitive     IMIPENEM <=0.25 SENSITIVE Sensitive     NITROFURANTOIN 32 SENSITIVE Sensitive     TRIMETH/SULFA <=20 SENSITIVE Sensitive     PIP/TAZO <=4 SENSITIVE Sensitive     * >=100,000 COLONIES/mL CITROBACTER KOSERI  MRSA PCR Screening     Status: None   Collection Time: 02/21/15 12:22 PM  Result Value Ref Range Status   MRSA by PCR NEGATIVE NEGATIVE Final    Comment:        The GeneXpert MRSA Assay (FDA  approved for NASAL specimens only), is one component of a comprehensive MRSA colonization surveillance program. It is not intended to diagnose MRSA infection nor to guide or monitor treatment for MRSA infections.      Studies: No results found.  Scheduled Meds: . brinzolamide  1 drop Left Eye TID  . cefTRIAXone (ROCEPHIN)  IV  1 g Intravenous Q24H  . enoxaparin (LOVENOX) injection  40 mg Subcutaneous Q24H  . famotidine (PEPCID) IV  10 mg Intravenous Q12H  . feeding supplement  1 Container Oral TID BM  . folic acid  1 mg Intravenous Daily  . insulin aspart  0-15 Units Subcutaneous 6 times per day  . insulin glargine  10 Units Subcutaneous BID  . insulin regular  0-10 Units Intravenous TID WC  . metoCLOPramide (REGLAN) injection  10 mg Intravenous 4 times per day  . metoprolol  5 mg Intravenous 4 times per day  . pantoprazole (PROTONIX) IV  40 mg Intravenous Q24H  . sodium chloride  10-40 mL Intracatheter Q12H  . sodium chloride  3 mL Intravenous Q12H  . thiamine  100 mg Intravenous Daily    Continuous Infusions: . 0.9 %  NaCl with KCl 20 mEq / L 75 mL/hr at 02/24/15 1609     Time spent:  Leather Estis MD, PhD  [facility] Pager [phone]. If 7PM-7AM, please contact night-coverage at [email or web address], password [facility] 02/24/2015, 5:10 PM  LOS: 3 days

## 2015-02-25 ENCOUNTER — Inpatient Hospital Stay (HOSPITAL_COMMUNITY): Payer: Medicaid Other

## 2015-02-25 DIAGNOSIS — I5022 Chronic systolic (congestive) heart failure: Secondary | ICD-10-CM

## 2015-02-25 DIAGNOSIS — E0810 Diabetes mellitus due to underlying condition with ketoacidosis without coma: Secondary | ICD-10-CM

## 2015-02-25 DIAGNOSIS — N179 Acute kidney failure, unspecified: Secondary | ICD-10-CM

## 2015-02-25 LAB — COMPREHENSIVE METABOLIC PANEL
ALBUMIN: 3.1 g/dL — AB (ref 3.5–5.0)
ALT: 8 U/L — ABNORMAL LOW (ref 14–54)
AST: 15 U/L (ref 15–41)
Alkaline Phosphatase: 128 U/L — ABNORMAL HIGH (ref 38–126)
Anion gap: 8 (ref 5–15)
BUN: 9 mg/dL (ref 6–20)
CALCIUM: 9.2 mg/dL (ref 8.9–10.3)
CO2: 26 mmol/L (ref 22–32)
Chloride: 105 mmol/L (ref 101–111)
Creatinine, Ser: 0.82 mg/dL (ref 0.44–1.00)
GFR calc Af Amer: >60 mL/min (ref >60)
Glucose, Bld: 167 mg/dL — ABNORMAL HIGH (ref 65–99)
Potassium: 3.8 mmol/L (ref 3.5–5.1)
SODIUM: 139 mmol/L (ref 135–145)
TOTAL PROTEIN: 7.9 g/dL (ref 6.5–8.1)
Total Bilirubin: 1 mg/dL (ref 0.3–1.2)

## 2015-02-25 LAB — GLUCOSE, CAPILLARY
GLUCOSE-CAPILLARY: 117 mg/dL — AB (ref 65–99)
GLUCOSE-CAPILLARY: 154 mg/dL — AB (ref 65–99)
GLUCOSE-CAPILLARY: 161 mg/dL — AB (ref 65–99)
GLUCOSE-CAPILLARY: 236 mg/dL — AB (ref 65–99)
Glucose-Capillary: 225 mg/dL — ABNORMAL HIGH (ref 65–99)
Glucose-Capillary: 220 mg/dL — ABNORMAL HIGH (ref 65–99)

## 2015-02-25 LAB — CBC
HCT: 45.2 % (ref 36.0–46.0)
Hemoglobin: 14.8 g/dL (ref 12.0–15.0)
MCH: 27.3 pg (ref 26.0–34.0)
MCHC: 32.7 g/dL (ref 30.0–36.0)
MCV: 83.4 fL (ref 78.0–100.0)
PLATELETS: 335 10*3/uL (ref 150–400)
RBC: 5.42 MIL/uL — ABNORMAL HIGH (ref 3.87–5.11)
RDW: 17.7 % — ABNORMAL HIGH (ref 11.5–15.5)
WBC: 19.6 10*3/uL — AB (ref 4.0–10.5)

## 2015-02-25 MED ORDER — MORPHINE SULFATE 2 MG/ML IJ SOLN
1.0000 mg | INTRAMUSCULAR | Status: DC | PRN
  Administered 2015-02-25 – 2015-03-02 (×4): 1 mg via INTRAVENOUS
  Filled 2015-02-25 (×4): qty 1

## 2015-02-25 MED ORDER — METOCLOPRAMIDE HCL 5 MG/ML IJ SOLN
10.0000 mg | Freq: Three times a day (TID) | INTRAMUSCULAR | Status: DC
  Administered 2015-02-25 – 2015-02-26 (×3): 10 mg via INTRAVENOUS
  Filled 2015-02-25 (×5): qty 2

## 2015-02-25 NOTE — Consult Note (Signed)
[facility] Consult Note  Referring Provider: No ref. provider found Primary Care Physician:  Barbette Merino, MD Primary Gastroenterologist:  Dr.  Laurel Dimmer Complaint: Nausea and vomiting with diarrhea HPI: [name] [name] is an 49 y.o. black female  with a 30 year history of diabetes mellitus who presents with nausea vomiting and diabetic ketoacidosis. There is some question of her possibly having dysphagia and I have done endoscopies on her with dilation of esophageal stricture in 2012 in 2009 with some associated inflammation. On her last EGD in 2014 she was not complaining of dysphagia but of abdominal pain and had mild gastritis but no appreciable stricture. When questioned closely it sounds like currently she is experiencing nausea, not esophageal obstruction. Her symptoms began rather acutely a few days ago but does have some preceding nausea and very mild occasional solid food dysphagia without forced regurgitation. She attributes this nausea to Vicodin. She is having right upper quadrant pain since the onset of her acute symptoms. She denies early satiety prior to the onset of her vomiting and diarrhea. She was sent for a barium swallow this morning results are pending. She is on Protonix daily  Past Medical History  Diagnosis Date  . Hypertension   . Sebaceous cyst   . Anemia   . Allergic rhinitis   . Hypertension   . Diabetic retinopathy   . Acute osteomyelitis   . Esophageal ulcer   . Esophageal stenosis   . Gastroparesis   . Renal insufficiency   . GERD (gastroesophageal reflux disease)   . Hyperlipidemia   . Depression   . Anxiety   . Polysubstance abuse   . Esophagitis   . CAD (coronary artery disease) 12/12    s/p DES mid and distal RCA with 50% LAD  . Heart murmur   . Pneumonia 2012  . Orthopnoea   . Type II diabetes mellitus   . Inferior MI 01/20/2009    /notes on 12/19/2012, "that's the only one I've had" (12/19/2012)  . Acute myocardial infarction, unspecified  site, initial episode of care   . Acute myocardial infarction of other lateral wall, initial episode of care   . Rheumatoid arthritis(714.0)   . Bipolar affective   . History of stomach ulcers   . Anginal pain     07/15/13- no chest pain in months"  . Stroke 2011    denies residual on 12/19/2012.  "Years ago"  . Daily headache     not daily  . Migraines     Past Surgical History  Procedure Laterality Date  . Irrigation and debridement sebaceous cyst Right 03/2011    "pointer" (12/19/2012)  . Coronary angioplasty with stent placement  01/20/2009    "2" (12/19/2012)  . Coronary angioplasty with stent placement  2012    "2" (12/19/2012)  . Coronary angioplasty with stent placement  12/19/2012    "2" (12/19/2012)  . Pars plana vitrectomy Left 07/16/2013    Procedure: PARS PLANA VITRECTOMY WITH 23 GAUGE;  Surgeon: Adonis Brook, MD;  Location: [facility];  Service: Ophthalmology;  Laterality: Left;  Marland Kitchen Membrane peel Left 07/16/2013    Procedure: MEMBRANE PEEL;  Surgeon: Adonis Brook, MD;  Location: [facility];  Service: Ophthalmology;  Laterality: Left;  . Photocoagulation with laser Left 07/16/2013    Procedure: PHOTOCOAGULATION WITH LASER;  Surgeon: Adonis Brook, MD;  Location: [facility];  Service: Ophthalmology;  Laterality: Left;  ENDOLASER  . Gas insertion Left 07/16/2013    Procedure: INSERTION OF GAS;  Surgeon: Adonis Brook, MD;  Location: [facility];  Service: Ophthalmology;  Laterality: Left;  SF6  . Pars plana vitrectomy Left 07/30/2013    Procedure: PARS PLANA VITRECTOMY WITH 23 GAUGE WITH ENDOLASER;  Surgeon: Adonis Brook, MD;  Location: [facility];  Service: Ophthalmology;  Laterality: Left;  with endolaser  . Gas/fluid exchange Left 07/30/2013    Procedure: GAS/FLUID EXCHANGE;  Surgeon: Adonis Brook, MD;  Location: [facility];  Service: Ophthalmology;  Laterality: Left;  . Left heart catheterization with coronary angiogram N/A 07/06/2011    Procedure: LEFT HEART CATHETERIZATION WITH CORONARY ANGIOGRAM;   Surgeon: Jettie Booze, MD;  Location: [facility];  Service: Cardiovascular;  Laterality: N/A;  possible PCI  . Percutaneous coronary stent intervention (pci-s) N/A 07/06/2011    Procedure: PERCUTANEOUS CORONARY STENT INTERVENTION (PCI-S);  Surgeon: Jettie Booze, MD;  Location: [facility] CATH LAB;  Service: Cardiovascular;  Laterality: N/A;  . Left heart catheterization with coronary angiogram N/A 12/19/2012    Procedure: LEFT HEART CATHETERIZATION WITH CORONARY ANGIOGRAM;  Surgeon: Jettie Booze, MD;  Location: [facility] CATH LAB;  Service: Cardiovascular;  Laterality: N/A;    Medications Prior to Admission  Medication Sig Dispense Refill  . acetaminophen (TYLENOL) 500 MG tablet Take 500 mg by mouth every 6 (six) hours as needed for moderate pain.     Marland Kitchen atorvastatin (LIPITOR) 40 MG tablet Take 40 mg by mouth daily.    . brinzolamide (AZOPT) 1 % ophthalmic suspension Place 1 drop into the left eye 3 (three) times daily.     . ferrous sulfate 325 (65 FE) MG tablet Take 325 mg by mouth daily with breakfast.     . furosemide (LASIX) 40 MG tablet TAKE 1 TABLET (40 MG TOTAL) BY MOUTH DAILY. 30 tablet 3  . insulin aspart (NOVOLOG) 100 UNIT/ML injection Inject 4-16 Units into the skin 3 (three) times daily with meals. Sliding scale CBG 100-150; 4 units, 151-200; 6 units, 201-250; 8 units, 251-300; 10 units, 301-350; 12 units, 351-400; 14 units, > 401; call doctor    . insulin glargine (LANTUS) 100 UNIT/ML injection Inject 16 Units into the skin 2 (two) times daily.     . metoprolol (LOPRESSOR) 50 MG tablet Take 50 mg by mouth 2 (two) times daily.    . nitroGLYCERIN (NITROSTAT) 0.4 MG SL tablet Place 0.4 mg under the tongue every 5 (five) minutes as needed for chest pain.     Marland Kitchen ondansetron (ZOFRAN) 4 MG tablet Take 4 mg by mouth every 6 (six) hours as needed for nausea.    . pantoprazole (PROTONIX) 40 MG tablet Take 40 mg by mouth 2 (two) times daily.    . sertraline (ZOLOFT) 100 MG tablet Take 100  mg by mouth daily.     . traMADol (ULTRAM) 50 MG tablet Take 50 mg by mouth every 6 (six) hours as needed for pain.       Allergies:  Allergies  Allergen Reactions  . Aspirin Other (See Comments)    Stomach bleeds  . Codeine Hives and Itching    Family History  Problem Relation Age of Onset  . Breast cancer Mother 41  . Hypertension Father   . Stomach cancer Sister   . Hypertension Sister   . Colon cancer Neg Hx     Social History:  reports that she has been smoking Cigarettes.  She has a 11 pack-year smoking history. She has never used smokeless tobacco. She reports that she drinks alcohol. She reports that she does not use illicit drugs.  Review of Systems: negative except as above   Blood pressure 156/93, pulse 123, temperature 98.9 F (37.2 C), temperature source Oral, resp. rate 18, height 5' (1.524 m), weight 82.736 kg (182 lb 6.4 oz), last menstrual period 01/12/2015, SpO2 96 %. Head: Normocephalic, without obvious abnormality, atraumatic Neck: no adenopathy, no carotid bruit, no JVD, supple, symmetrical, trachea midline and thyroid not enlarged, symmetric, no tenderness/mass/nodules Resp: clear to auscultation bilaterally Cardio: regular rate and rhythm, S1, S2 normal, no murmur, click, rub or gallop GI: Abdomen soft with mild tenderness in the right upper quadrant but no megaly masses or guarding Extremities: extremities normal, atraumatic, no cyanosis or edema  Results for orders placed or performed during the hospital encounter of 02/21/15 (from the past 48 hour(s))  Glucose, capillary     Status: Abnormal   Collection Time: 02/23/15 12:21 PM  Result Value Ref Range   Glucose-Capillary 203 (H) 65 - 99 mg/dL   Comment 1 Notify RN   Glucose, capillary     Status: Abnormal   Collection Time: 02/23/15  4:58 PM  Result Value Ref Range   Glucose-Capillary 141 (H) 65 - 99 mg/dL  Hemoglobin A1c     Status: Abnormal   Collection Time: 02/23/15  5:46 PM  Result Value  Ref Range   Hgb A1c MFr Bld 7.5 (H) 4.8 - 5.6 %    Comment: (NOTE)         Pre-diabetes: 5.7 - 6.4         Diabetes: >6.4         Glycemic control for adults with diabetes: <7.0    Mean Plasma Glucose 169 mg/dL    Comment: (NOTE) Performed At: [facility] [place], [place] [id number] Lindon Romp MD FS:[id number]   Glucose, capillary     Status: Abnormal   Collection Time: 02/23/15  8:14 PM  Result Value Ref Range   Glucose-Capillary 133 (H) 65 - 99 mg/dL   Comment 1 Notify RN    Comment 2 Document in Chart   Glucose, capillary     Status: Abnormal   Collection Time: 02/23/15 11:52 PM  Result Value Ref Range   Glucose-Capillary 205 (H) 65 - 99 mg/dL   Comment 1 Notify RN    Comment 2 Document in Chart   Glucose, capillary     Status: Abnormal   Collection Time: 02/24/15  4:49 AM  Result Value Ref Range   Glucose-Capillary 128 (H) 65 - 99 mg/dL   Comment 1 Notify RN    Comment 2 Document in Chart   CBC     Status: Abnormal   Collection Time: 02/24/15  5:55 AM  Result Value Ref Range   WBC 12.2 (H) 4.0 - 10.5 K/uL   RBC 5.22 (H) 3.87 - 5.11 MIL/uL   Hemoglobin 14.2 12.0 - 15.0 g/dL   HCT 43.6 36.0 - 46.0 %   MCV 83.5 78.0 - 100.0 fL   MCH 27.2 26.0 - 34.0 pg   MCHC 32.6 30.0 - 36.0 g/dL   RDW 17.6 (H) 11.5 - 15.5 %   Platelets 308 150 - 400 K/uL  Comprehensive metabolic panel     Status: Abnormal   Collection Time: 02/24/15  5:55 AM  Result Value Ref Range   Sodium 135 135 - 145 mmol/L   Potassium 3.6 3.5 - 5.1 mmol/L   Chloride 101 101 - 111 mmol/L   CO2 24 22 - 32 mmol/L   Glucose, Bld 204 (H) 65 -  99 mg/dL   BUN 6 6 - 20 mg/dL   Creatinine, Ser 0.91 0.44 - 1.00 mg/dL   Calcium 8.8 (L) 8.9 - 10.3 mg/dL   Total Protein 8.0 6.5 - 8.1 g/dL   Albumin 3.2 (L) 3.5 - 5.0 g/dL   AST 16 15 - 41 U/L   ALT 8 (L) 14 - 54 U/L   Alkaline Phosphatase 129 (H) 38 - 126 U/L   Total Bilirubin 1.0 0.3 - 1.2 mg/dL   GFR calc non Af Amer >60 >60 mL/min    GFR calc Af Amer >60 >60 mL/min    Comment: (NOTE) The eGFR has been calculated using the CKD EPI equation. This calculation has not been validated in all clinical situations. eGFR's persistently <60 mL/min signify possible Chronic Kidney Disease.    Anion gap 10 5 - 15  Magnesium     Status: Abnormal   Collection Time: 02/24/15  5:55 AM  Result Value Ref Range   Magnesium 1.5 (L) 1.7 - 2.4 mg/dL  Glucose, capillary     Status: Abnormal   Collection Time: 02/24/15  8:16 AM  Result Value Ref Range   Glucose-Capillary 214 (H) 65 - 99 mg/dL  Glucose, capillary     Status: Abnormal   Collection Time: 02/24/15 10:44 AM  Result Value Ref Range   Glucose-Capillary 261 (H) 65 - 99 mg/dL   Comment 1 Notify RN   Glucose, capillary     Status: Abnormal   Collection Time: 02/24/15 12:38 PM  Result Value Ref Range   Glucose-Capillary 223 (H) 65 - 99 mg/dL  Glucose, capillary     Status: Abnormal   Collection Time: 02/24/15  4:38 PM  Result Value Ref Range   Glucose-Capillary 181 (H) 65 - 99 mg/dL   Comment 1 Notify RN    Comment 2 Document in Chart   Glucose, capillary     Status: Abnormal   Collection Time: 02/24/15  8:23 PM  Result Value Ref Range   Glucose-Capillary 109 (H) 65 - 99 mg/dL   Comment 1 Notify RN    Comment 2 Document in Chart   Glucose, capillary     Status: Abnormal   Collection Time: 02/25/15 12:08 AM  Result Value Ref Range   Glucose-Capillary 117 (H) 65 - 99 mg/dL  Glucose, capillary     Status: Abnormal   Collection Time: 02/25/15  4:25 AM  Result Value Ref Range   Glucose-Capillary 154 (H) 65 - 99 mg/dL  CBC     Status: Abnormal   Collection Time: 02/25/15  4:39 AM  Result Value Ref Range   WBC 19.6 (H) 4.0 - 10.5 K/uL   RBC 5.42 (H) 3.87 - 5.11 MIL/uL   Hemoglobin 14.8 12.0 - 15.0 g/dL   HCT 45.2 36.0 - 46.0 %   MCV 83.4 78.0 - 100.0 fL   MCH 27.3 26.0 - 34.0 pg   MCHC 32.7 30.0 - 36.0 g/dL   RDW 17.7 (H) 11.5 - 15.5 %   Platelets 335 150 - 400  K/uL  Comprehensive metabolic panel     Status: Abnormal   Collection Time: 02/25/15  4:39 AM  Result Value Ref Range   Sodium 139 135 - 145 mmol/L   Potassium 3.8 3.5 - 5.1 mmol/L   Chloride 105 101 - 111 mmol/L   CO2 26 22 - 32 mmol/L   Glucose, Bld 167 (H) 65 - 99 mg/dL   BUN 9 6 - 20 mg/dL   Creatinine, Ser  0.82 0.44 - 1.00 mg/dL   Calcium 9.2 8.9 - 10.3 mg/dL   Total Protein 7.9 6.5 - 8.1 g/dL   Albumin 3.1 (L) 3.5 - 5.0 g/dL   AST 15 15 - 41 U/L   ALT 8 (L) 14 - 54 U/L   Alkaline Phosphatase 128 (H) 38 - 126 U/L   Total Bilirubin 1.0 0.3 - 1.2 mg/dL   GFR calc non Af Amer >60 >60 mL/min   GFR calc Af Amer >60 >60 mL/min    Comment: (NOTE) The eGFR has been calculated using the CKD EPI equation. This calculation has not been validated in all clinical situations. eGFR's persistently <60 mL/min signify possible Chronic Kidney Disease.    Anion gap 8 5 - 15  Glucose, capillary     Status: Abnormal   Collection Time: 02/25/15  7:57 AM  Result Value Ref Range   Glucose-Capillary 161 (H) 65 - 99 mg/dL   Comment 1 Notify RN    No results found.  Assessment: 1. Nausea vomiting and diarrhea, I do not believe she is having dysphagia, symptoms likely due to DKA possibly associated with viral gastroenteritis and possibly exacerbated by low level gastroparesis which otherwise she does not have severe chronic symptoms of 2. Right upper quadrant abdominal pain of recent origin unclear whether separate etiology for this Plan:  Will review barium swallow but  from the history I doubt she will need EGD with dilatation Symptomatic treatment for nausea and specific treatment of DKA. Although CT scan was unrevealing will obtain a gallbladder ultrasound.  Teran Daughenbaugh C 02/25/2015, 8:41 AM  Pager [phone] If no answer or after 5 PM call [phone]

## 2015-02-25 NOTE — Progress Notes (Signed)
PROGRESS NOTE  [name] [name] ZOX:[id number] DOB: 1965/11/21 DOA: 02/21/2015 PCP: Lonia Blood, MD  HPI/Recap of past 24 hours:  Just back from BA study, still reports some abd pain and nausea, no further vomiting  Assessment/Plan: DKA /Uncontrolled type 2 diabetes mellitus with insulin therapy -Suspect precipitated by infectious processes likely combination of suspected UTI with gastroparesis symptoms; treat underlying causes -HgbA1c 7.5 -resolved, off insulin drip -on lantus/SSI, stable -Lactic acidosis: resolved, Negative acetaminophen level, negative salicylate level.   Intractable Nausea vomiting  -likely combination of gastroparesis/uti+/-gastritis -Esophogram/Ba swallow with Marked esophageal dilation and mucosal hypertrophy without a definite focal lesion, Marked dysfunction at the GE junction with a functional stricture, GE junction does open intermittently to be a relatively normal caliber, No definite reflux was present -Change reglan to TID AC, increase reglan, continue symptom management with scheduled Zofran and when necessary Phenergan -continue clears, stop IVF -Gi following, Gallbladder US for tomorrow   Severe dehydration with hypokalemia -Patient received IV potassium boluses in the ER -on normal saline IV fluids with potassium since admission  Mild chronic systolic heart 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 -stop IVF, resume lasix when tolerating Pos -resume PO Metoprolol when stable   Acute renal failure on CKD, stage III -from UTI and prerenal due to dehydration -cr on admission 1.72, normalized.   Citrobacter UTI --Completed 5day course of IV rocephin today, Stop after todays dose   HTN - scheduled IV Lopressor, add prn hydralazine, will restart po meds   when n/v improves   Rheumatoid arthritis -Not on immunosuppressants/DMARDs prior to admission    Diabetic gastroparesis -iv reglan   History of NSTEMI w/ DES to cfx May 2014 -stable -Continue beta blocker as above   h/o Anemia -Baseline hemoglobin around 12.9 and current hemoglobin 15.6 consistent with hemoconcentration from profound volume depletion -Preadmission iron on hold   Tobacco abuse -Counseled on cessation   Hyperlipidemia -Not on medications prior to admission  Code Status: full Family Communication: none at bedside Disposition Plan: home when improved, ? 1-2days   Consultants:  [facility]  Procedures:  PICC 7/24  Antibiotics:  Cipro/flagyl from admission to 7/26  Rocephin from 7/27   Objective: BP 156/93 mmHg  Pulse 123  Temp(Src) 98.9 F (37.2 C) (Oral)  Resp 18  Ht 5' (1.524 m)  Wt 82.736 kg (182 lb 6.4 oz)  BMI 35.62 kg/m2  SpO2 96%  LMP 01/12/2015  Intake/Output Summary (Last 24 hours) at 02/25/15 1239 Last data filed at 02/25/15 0800  Gross per 24 hour  Intake    120 ml  Output      0 ml  Net    120 ml   Filed Weights   02/21/15 1113 02/24/15 0503 02/25/15 0434  Weight: 84.2 kg (185 lb 10 oz) 83.1 kg (183 lb 3.2 oz) 82.736 kg (182 lb 6.4 oz)    Exam:   General:  AAOx3, no distress  Cardiovascular: sinus tachycardia  Respiratory: CTABL  Abdomen: epigastric tender, no rebound, Soft/ND, positive BS  Musculoskeletal: No Edema  Neuro: aaox3  Data Reviewed: Basic Metabolic Panel:  Recent Labs Lab 02/21/15 1700 02/22/15 0440 02/23/15 0400 02/24/15 0555 02/25/15 0439  NA 148* 142 138 135 139  K 3.0* 3.2* 3.6 3.6 3.8  CL 114* 112* 102 101 105  CO2 27 26 25 24 26   GLUCOSE 149* 115* 133* 204* 167*  BUN 15 9 6 6 9   CREATININE 0.99 0.84  0.75 0.91 0.82  CALCIUM 8.7* 8.2* 9.0 8.8* 9.2  MG  --   --   --  1.5*  --    Liver Function Tests:  Recent Labs Lab 02/21/15 0357 02/22/15 0440 02/23/15 0400 02/24/15 0555 02/25/15 0439  AST 28 18 22 16 15   ALT 9* 6* 8* 8* 8*  ALKPHOS 162* 120 136* 129* 128*    BILITOT 0.9 0.6 0.7 1.0 1.0  PROT 9.6* 6.6 7.8 8.0 7.9  ALBUMIN 4.0 2.7* 3.2* 3.2* 3.1*    Recent Labs Lab 02/21/15 0357  LIPASE 20*   No results for input(s): AMMONIA in the last 168 hours. CBC:  Recent Labs Lab 02/21/15 0357 02/22/15 0440 02/23/15 0400 02/24/15 0555 02/25/15 0439  WBC 12.8* 9.6 14.8* 12.2* 19.6*  HGB 15.6* 11.4* 13.9 14.2 14.8  HCT 45.9 36.0 42.2 43.6 45.2  MCV 80.0 83.9 82.9 83.5 83.4  PLT 400 312 357 308 335   Cardiac Enzymes:   No results for input(s): CKTOTAL, CKMB, CKMBINDEX, TROPONINI in the last 168 hours. BNP (last 3 results) No results for input(s): BNP in the last 8760 hours.  ProBNP (last 3 results) No results for input(s): PROBNP in the last 8760 hours.  CBG:  Recent Labs Lab 02/24/15 2023 02/25/15 0008 02/25/15 0425 02/25/15 0757 02/25/15 1149  GLUCAP 109* 117* 154* 161* 225*    Recent Results (from the past 240 hour(s))  Urine culture     Status: None   Collection Time: 02/21/15  4:32 AM  Result Value Ref Range Status   Specimen Description URINE, RANDOM  Final   Special Requests NONE  Final   Culture >=100,000 COLONIES/mL CITROBACTER KOSERI  Final   Report Status 02/23/2015 FINAL  Final   Organism ID, Bacteria CITROBACTER KOSERI  Final      Susceptibility   Citrobacter koseri - MIC*    CEFAZOLIN <=4 SENSITIVE Sensitive     CEFTRIAXONE <=1 SENSITIVE Sensitive     CIPROFLOXACIN <=0.25 SENSITIVE Sensitive     GENTAMICIN <=1 SENSITIVE Sensitive     IMIPENEM <=0.25 SENSITIVE Sensitive     NITROFURANTOIN 32 SENSITIVE Sensitive     TRIMETH/SULFA <=20 SENSITIVE Sensitive     PIP/TAZO <=4 SENSITIVE Sensitive     * >=100,000 COLONIES/mL CITROBACTER KOSERI  MRSA PCR Screening     Status: None   Collection Time: 02/21/15 12:22 PM  Result Value Ref Range Status   MRSA by PCR NEGATIVE NEGATIVE Final    Comment:        The GeneXpert MRSA Assay (FDA approved for NASAL specimens only), is one component of a comprehensive  MRSA colonization surveillance program. It is not intended to diagnose MRSA infection nor to guide or monitor treatment for MRSA infections.      Studies: Dg Esophagus  02/25/2015   CLINICAL DATA:  Esophageal stricture and dilation. Esophageal ulcer in esophagitis patient feels the it food and liquids are stuck in her chest.  EXAM: ESOPHOGRAM/BARIUM SWALLOW  TECHNIQUE: Combined double contrast and single contrast examination performed using effervescent crystals, thick barium liquid, and thin barium liquid.  FLUOROSCOPY TIME:  Radiation Exposure Index (as provided by the fluoroscopic device): 506.65 uGy*m2  COMPARISON:  Esophagram Wm 07/06/2006  FINDINGS: Initial laryngeal images demonstrate no significant laryngeal penetration or aspiration.  The distal esophagus is dilated. There is mucosal irregularity suggesting hypertrophy. A distal esophageal stricture is again noted. This may be functional as there is intermittent opening of the GE junction to a more normal caliber.  Esophageal motility is disorganized with significant stasis of contrast in the distal esophagus.  A 13 mm barium tablet was administered. This passed freely through the GE junction.  Dense coronary artery calcifications are noted.  IMPRESSION: 1. Marked esophageal dilation and mucosal hypertrophy without a definite focal lesion. 2. 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 The GE junction does open intermittently to what appears to be a relatively normal caliber. 3. No definite reflux was present. However, the patient did belch several times during the exam with significant stasis in the distal esophagus.   Electronically Signed   By: Marin Roberts M.D.   On: 02/25/2015 09:11    Scheduled Meds: . brinzolamide  1 drop Left Eye TID  . enoxaparin (LOVENOX) injection  40 mg Subcutaneous Q24H  . famotidine (PEPCID) IV  10 mg Intravenous Q12H  . feeding supplement  1 Container Oral TID BM  . folic acid   1 mg Intravenous Daily  . insulin aspart  0-15 Units Subcutaneous 6 times per day  . insulin glargine  10 Units Subcutaneous BID  . insulin regular  0-10 Units Intravenous TID WC  . metoCLOPramide (REGLAN) injection  10 mg Intravenous TID AC  . metoprolol  5 mg Intravenous 4 times per day  . pantoprazole (PROTONIX) IV  40 mg Intravenous Q24H  . sodium chloride  10-40 mL Intracatheter Q12H  . sodium chloride  3 mL Intravenous Q12H  . thiamine  100 mg Intravenous Daily    Continuous Infusions:     Time spent:  Katharyn Schauer MD,  [facility] Pager [phone] If 7PM-7AM, please contact night-coverage at [email or web address], password [facility] 02/25/2015, 12:39 PM  LOS: 4 days

## 2015-02-25 NOTE — Progress Notes (Signed)
UR Completed. Joyelle Siedlecki, RN, BSN.  [phone] 

## 2015-02-25 NOTE — Progress Notes (Signed)
PT Cancellation Note  Patient Details Name: [name] [name] MRN: [id number] DOB: [birth year]   Cancelled Treatment:    Reason Eval/Treat Not Completed: Pt declines participating with therapy at this time. Pt with eyes closed and making minimal movements with her head to indicate yes or no to questions. Asked if pt could open her eyes and speak with therapist, and pt would only maintain for a few seconds at a time. Per nursing staff pt is able to walk in room without assistance. Will check back as schedule allows to complete PT eval.    Conni Slipper 02/25/2015, 11:43 AM   Conni Slipper, PT, DPT Acute Rehabilitation Services Pager: [phone]

## 2015-02-26 ENCOUNTER — Encounter (HOSPITAL_COMMUNITY)
Admission: EM | Disposition: A | Discharge status: Home / Self Care | Payer: Medicaid Other | Source: Home / Self Care | Attending: Internal Medicine

## 2015-02-26 HISTORY — PX: ESOPHAGOGASTRODUODENOSCOPY: SHX5428

## 2015-02-26 LAB — GLUCOSE, CAPILLARY
Glucose-Capillary: 112 mg/dL — ABNORMAL HIGH (ref 65–99)
Glucose-Capillary: 164 mg/dL — ABNORMAL HIGH (ref 65–99)
Glucose-Capillary: 174 mg/dL — ABNORMAL HIGH (ref 65–99)
Glucose-Capillary: 181 mg/dL — ABNORMAL HIGH (ref 65–99)
Glucose-Capillary: 218 mg/dL — ABNORMAL HIGH (ref 65–99)
Glucose-Capillary: 96 mg/dL (ref 65–99)

## 2015-02-26 LAB — COMPREHENSIVE METABOLIC PANEL
ALK PHOS: 123 U/L (ref 38–126)
ALT: 6 U/L — AB (ref 14–54)
AST: 13 U/L — AB (ref 15–41)
Albumin: 2.9 g/dL — ABNORMAL LOW (ref 3.5–5.0)
Anion gap: 9 (ref 5–15)
BILIRUBIN TOTAL: 1.1 mg/dL (ref 0.3–1.2)
BUN: 7 mg/dL (ref 6–20)
CHLORIDE: 103 mmol/L (ref 101–111)
CO2: 25 mmol/L (ref 22–32)
CREATININE: 0.84 mg/dL (ref 0.44–1.00)
Calcium: 9 mg/dL (ref 8.9–10.3)
GFR calc non Af Amer: >60 mL/min (ref >60)
Glucose, Bld: 145 mg/dL — ABNORMAL HIGH (ref 65–99)
POTASSIUM: 3.2 mmol/L — AB (ref 3.5–5.1)
Sodium: 137 mmol/L (ref 135–145)
Total Protein: 7.6 g/dL (ref 6.5–8.1)

## 2015-02-26 LAB — CBC
HCT: 43.3 % (ref 36.0–46.0)
HEMOGLOBIN: 14.1 g/dL (ref 12.0–15.0)
MCH: 27.1 pg (ref 26.0–34.0)
MCHC: 32.6 g/dL (ref 30.0–36.0)
MCV: 83.1 fL (ref 78.0–100.0)
Platelets: 289 10*3/uL (ref 150–400)
RBC: 5.21 MIL/uL — ABNORMAL HIGH (ref 3.87–5.11)
RDW: 17.7 % — AB (ref 11.5–15.5)
WBC: 18.5 10*3/uL — AB (ref 4.0–10.5)

## 2015-02-26 SURGERY — EGD (ESOPHAGOGASTRODUODENOSCOPY)
Anesthesia: Moderate Sedation

## 2015-02-26 MED ORDER — MIDAZOLAM HCL 10 MG/2ML IJ SOLN
INTRAMUSCULAR | Status: DC | PRN
  Administered 2015-02-26 (×2): 2 mg via INTRAVENOUS

## 2015-02-26 MED ORDER — POTASSIUM CHLORIDE 20 MEQ/15ML (10%) PO SOLN
40.0000 meq | Freq: Once | ORAL | Status: AC
  Administered 2015-02-26: 40 meq via ORAL
  Filled 2015-02-26 (×2): qty 30

## 2015-02-26 MED ORDER — METOCLOPRAMIDE HCL 5 MG/ML IJ SOLN
10.0000 mg | Freq: Three times a day (TID) | INTRAMUSCULAR | Status: DC
  Administered 2015-02-26 – 2015-02-27 (×2): 10 mg via INTRAVENOUS
  Filled 2015-02-26 (×5): qty 2

## 2015-02-26 MED ORDER — MIDAZOLAM HCL 5 MG/ML IJ SOLN
INTRAMUSCULAR | Status: AC
  Filled 2015-02-26: qty 2

## 2015-02-26 MED ORDER — FENTANYL CITRATE (PF) 100 MCG/2ML IJ SOLN
INTRAMUSCULAR | Status: DC | PRN
  Administered 2015-02-26 (×2): 25 ug via INTRAVENOUS

## 2015-02-26 MED ORDER — OXYCODONE-ACETAMINOPHEN 5-325 MG PO TABS
1.0000 | ORAL_TABLET | Freq: Four times a day (QID) | ORAL | Status: DC | PRN
  Administered 2015-02-26 – 2015-03-02 (×9): 1 via ORAL
  Filled 2015-02-26 (×10): qty 1

## 2015-02-26 MED ORDER — DIPHENHYDRAMINE HCL 50 MG/ML IJ SOLN
INTRAMUSCULAR | Status: AC
  Filled 2015-02-26: qty 1

## 2015-02-26 MED ORDER — METOPROLOL TARTRATE 50 MG PO TABS
50.0000 mg | ORAL_TABLET | Freq: Two times a day (BID) | ORAL | Status: DC
  Administered 2015-02-26 – 2015-03-02 (×7): 50 mg via ORAL
  Filled 2015-02-26 (×10): qty 1

## 2015-02-26 MED ORDER — ONDANSETRON HCL 4 MG/2ML IJ SOLN
INTRAMUSCULAR | Status: AC
  Filled 2015-02-26: qty 2

## 2015-02-26 MED ORDER — PANTOPRAZOLE SODIUM 40 MG IV SOLR
40.0000 mg | Freq: Two times a day (BID) | INTRAVENOUS | Status: DC
  Administered 2015-02-26 – 2015-02-28 (×5): 40 mg via INTRAVENOUS
  Filled 2015-02-26 (×7): qty 40

## 2015-02-26 MED ORDER — FENTANYL CITRATE (PF) 100 MCG/2ML IJ SOLN
INTRAMUSCULAR | Status: AC
  Filled 2015-02-26: qty 2

## 2015-02-26 MED ORDER — BUTAMBEN-TETRACAINE-BENZOCAINE 2-2-14 % EX AERO
INHALATION_SPRAY | CUTANEOUS | Status: DC | PRN
  Administered 2015-02-26: 2 via TOPICAL

## 2015-02-26 MED ORDER — SODIUM CHLORIDE 0.9 % IV SOLN
INTRAVENOUS | Status: DC
Start: 2015-02-26 — End: 2015-02-26
  Administered 2015-02-26: 20 mL/h via INTRAVENOUS

## 2015-02-26 NOTE — Progress Notes (Signed)
PROGRESS NOTE  [name] [name] ZOX:[id number] DOB: [birth year] DOA: 02/21/2015 PCP: Lonia Blood, MD  HPI/Recap of past 24 hours:  Still with Dysphagia, very poor PO Intake  Assessment/Plan: Mild DKA /type 2 diabetes mellitus with insulin therapy -Suspect precipitated by infectious processes likely combination of suspected UTI with gastroparesis symptoms; treat underlying causes -HgbA1c 7.5 -resolved, off insulin drip -on lantus/SSI, stable -Lactic acidosis: resolved, Negative acetaminophen level, negative salicylate level.   Intractable Nausea vomiting  -likely combination of gastroparesis/uti+/-gastritis -Esophogram/Ba swallow with Marked esophageal dilation and mucosal hypertrophy without a definite focal lesion, Marked dysfunction at the GE junction with a functional stricture, GE junction does open intermittently to be a relatively normal caliber, No definite reflux was present -Changed reglan to TID AC, increase reglan, continue symptom management with scheduled Zofran and when necessary Phenergan -supportive care, Gb US wnl -Gi following, plan for EGD today   Severe dehydration with hypokalemia -replace  Leukocytosis -etiology unclear, afebrile, completed Rx for UTI -CT abd and US-unremarkable  Mild chronic systolic heart 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 -stop IVF, resume lasix in the next 1-2days -resume PO Metoprolol    Acute renal failure on CKD, stage III -from UTI and prerenal due to dehydration -cr on admission 1.72, normalized.   Citrobacter UTI --Completed 5day course of IV rocephin today, Stop after todays dose   HTN - scheduled IV Lopressor, add prn hydralazine, will restart po meds   when n/v improves   Rheumatoid arthritis -Not on immunosuppressants/DMARDs prior to admission   Diabetic gastroparesis -iv reglan   History of NSTEMI w/ DES to cfx May  2014 -stable -Continue beta blocker as above   h/o Anemia -Baseline hemoglobin around 12.9 and current hemoglobin 15.6 consistent with hemoconcentration from profound volume depletion -Preadmission iron on hold   Tobacco abuse -Counseled on cessation   Hyperlipidemia -Not on medications prior to admission  Code Status: full Family Communication: none at bedside Disposition Plan: home when improved, ? 1-2days   Consultants:  [facility]  Procedures:  PICC 7/24  Antibiotics:  Cipro/flagyl from admission to 7/26  Rocephin from 7/27   Objective: BP 143/63 mmHg  Pulse 109  Temp(Src) 99.1 F (37.3 C) (Oral)  Resp 18  Ht 5' (1.524 m)  Wt 83.144 kg (183 lb 4.8 oz)  BMI 35.80 kg/m2  SpO2 100%  LMP 01/12/2015  Intake/Output Summary (Last 24 hours) at 02/26/15 1202 Last data filed at 02/26/15 0800  Gross per 24 hour  Intake    100 ml  Output      0 ml  Net    100 ml   Filed Weights   02/24/15 0503 02/25/15 0434 02/26/15 0412  Weight: 83.1 kg (183 lb 3.2 oz) 82.736 kg (182 lb 6.4 oz) 83.144 kg (183 lb 4.8 oz)    Exam:   General:  AAOx3, no distress  Cardiovascular: sinus tachycardia  Respiratory: CTABL  Abdomen: epigastric tender, no rebound, Soft/ND, positive BS  Musculoskeletal: No Edema  Neuro: aaox3  Data Reviewed: Basic Metabolic Panel:  Recent Labs Lab 02/22/15 0440 02/23/15 0400 02/24/15 0555 02/25/15 0439 02/26/15 0450  NA 142 138 135 139 137  K 3.2* 3.6 3.6 3.8 3.2*  CL 112* 102 101 105 103  CO2 26 25 24 26 25   GLUCOSE 115* 133* 204* 167* 145*  BUN 9 6 6 9 7   CREATININE 0.84 0.75 0.91 0.82 0.84  CALCIUM 8.2* 9.0 8.8* 9.2 9.0  MG  --   --  1.5*  --   --    Liver Function Tests:  Recent Labs Lab 02/22/15 0440 02/23/15 0400 02/24/15 0555 02/25/15 0439 02/26/15 0450  AST 18 22 16 15  13*  ALT 6* 8* 8* 8* 6*  ALKPHOS 120 136* 129* 128* 123  BILITOT 0.6 0.7 1.0 1.0 1.1  PROT 6.6 7.8 8.0 7.9 7.6  ALBUMIN 2.7* 3.2* 3.2*  3.1* 2.9*    Recent Labs Lab 02/21/15 0357  LIPASE 20*   No results for input(s): AMMONIA in the last 168 hours. CBC:  Recent Labs Lab 02/22/15 0440 02/23/15 0400 02/24/15 0555 02/25/15 0439 02/26/15 0450  WBC 9.6 14.8* 12.2* 19.6* 18.5*  HGB 11.4* 13.9 14.2 14.8 14.1  HCT 36.0 42.2 43.6 45.2 43.3  MCV 83.9 82.9 83.5 83.4 83.1  PLT 312 357 308 335 289   Cardiac Enzymes:   No results for input(s): CKTOTAL, CKMB, CKMBINDEX, TROPONINI in the last 168 hours. BNP (last 3 results) No results for input(s): BNP in the last 8760 hours.  ProBNP (last 3 results) No results for input(s): PROBNP in the last 8760 hours.  CBG:  Recent Labs Lab 02/25/15 2003 02/26/15 0004 02/26/15 0401 02/26/15 0814 02/26/15 1147  GLUCAP 220* 96 112* 164* 181*    Recent Results (from the past 240 hour(s))  Urine culture     Status: None   Collection Time: 02/21/15  4:32 AM  Result Value Ref Range Status   Specimen Description URINE, RANDOM  Final   Special Requests NONE  Final   Culture >=100,000 COLONIES/mL CITROBACTER KOSERI  Final   Report Status 02/23/2015 FINAL  Final   Organism ID, Bacteria CITROBACTER KOSERI  Final      Susceptibility   Citrobacter koseri - MIC*    CEFAZOLIN <=4 SENSITIVE Sensitive     CEFTRIAXONE <=1 SENSITIVE Sensitive     CIPROFLOXACIN <=0.25 SENSITIVE Sensitive     GENTAMICIN <=1 SENSITIVE Sensitive     IMIPENEM <=0.25 SENSITIVE Sensitive     NITROFURANTOIN 32 SENSITIVE Sensitive     TRIMETH/SULFA <=20 SENSITIVE Sensitive     PIP/TAZO <=4 SENSITIVE Sensitive     * >=100,000 COLONIES/mL CITROBACTER KOSERI  MRSA PCR Screening     Status: None   Collection Time: 02/21/15 12:22 PM  Result Value Ref Range Status   MRSA by PCR NEGATIVE NEGATIVE Final    Comment:        The GeneXpert MRSA Assay (FDA approved for NASAL specimens only), is one component of a comprehensive MRSA colonization surveillance program. It is not intended to diagnose MRSA infection  nor to guide or monitor treatment for MRSA infections.      Studies: US Abdomen Limited  02/25/2015   CLINICAL DATA:  Right upper quadrant abdominal pain since 02/21/2015  EXAM: US ABDOMEN LIMITED - RIGHT UPPER QUADRANT  COMPARISON:  CT abdomen pelvis dated 02/21/2015  FINDINGS: Gallbladder:  No gallstones, gallbladder wall thickening, or pericholecystic fluid. Negative sonographic Murphy's sign.  Common bile duct:  Diameter: 5 mm  Liver:  No focal lesion identified. Within normal limits in parenchymal echogenicity.  IMPRESSION: Negative right upper quadrant ultrasound.   Electronically Signed   By: Charline Bills M.D.   On: 02/25/2015 14:33    Scheduled Meds: . brinzolamide  1 drop Left Eye TID  . enoxaparin (LOVENOX) injection  40 mg Subcutaneous Q24H  . famotidine (PEPCID) IV  10 mg Intravenous Q12H  . feeding supplement  1 Container Oral TID  BM  . folic acid  1 mg Intravenous Daily  . insulin aspart  0-15 Units Subcutaneous 6 times per day  . insulin glargine  10 Units Subcutaneous BID  . metoCLOPramide (REGLAN) injection  10 mg Intravenous TID AC  . metoprolol tartrate  50 mg Oral BID  . pantoprazole (PROTONIX) IV  40 mg Intravenous Q24H  . sodium chloride  10-40 mL Intracatheter Q12H  . sodium chloride  3 mL Intravenous Q12H  . thiamine  100 mg Intravenous Daily    Continuous Infusions: . sodium chloride 20 mL/hr (02/26/15 1120)     Time spent:  Michaelah Credeur MD,  [facility] Pager [phone] If 7PM-7AM, please contact night-coverage at [email or web address], password [facility] 02/26/2015, 12:02 PM  LOS: 5 days

## 2015-02-26 NOTE — Evaluation (Signed)
Physical Therapy Evaluation Patient Details Name: [name] [name] MRN: [id number] DOB: [birth year] Today's Date: 02/26/2015   History of Present Illness  49 y/o female presented to the ER with acute onset of intractable nausea and vomiting with diarrhea. These symptoms were associated with sharp/ diffuse abdominal pain and began around 9 PM on 7/23. Patient endorses watery diarrhea with greater than 20 episodes of emesis and diarrhea since onset of symptoms.  Clinical Impression  Pt presents with above and decreased mobility and balance.  Note generalized weakness and decreased activity tolerance as well.  Recommend continued acute services to address deficits, however do not feel she will need any follow up at D/C.     Follow Up Recommendations No PT follow up    Equipment Recommendations  None recommended by PT    Recommendations for Other Services       Precautions / Restrictions Precautions Precautions: Fall Restrictions Weight Bearing Restrictions: No      Mobility  Bed Mobility Overal bed mobility: Modified Independent                Transfers Overall transfer level: Modified independent                  Ambulation/Gait Ambulation/Gait assistance: Supervision Ambulation Distance (Feet): 20 Feet Assistive device: None Gait Pattern/deviations: Decreased stride length;Wide base of support;Trunk flexed     General Gait Details: Pt with very slow staggered gait.  No overt LOB, however was very fatigued and states that she has not eaten in days.   Stairs            Wheelchair Mobility    Modified Rankin (Stroke Patients Only)       Balance Overall balance assessment: Needs assistance Sitting-balance support: Feet supported Sitting balance-Leahy Scale: Good     Standing balance support: During functional activity;No upper extremity supported Standing balance-Leahy Scale: Fair Standing balance comment: Pt able to stand at sink without UE support at  S level.                              Pertinent Vitals/Pain Pain Assessment: 0-10 Pain Score: 10-Worst pain ever Pain Location: abdominal pain Pain Descriptors / Indicators: Aching Pain Intervention(s): Limited activity within patient's tolerance;Monitored during session;Repositioned    Home Living Family/patient expects to be discharged to:: Private residence Living Arrangements: Alone;Children;Other relatives Available Help at Discharge: Family;Available PRN/intermittently Type of Home: House Home Access: Level entry     Home Layout: One level Home Equipment: Walker - 4 wheels;Cane - single point      Prior Function Level of Independence: Independent               Hand Dominance        Extremity/Trunk Assessment               Lower Extremity Assessment: Generalized weakness      Cervical / Trunk Assessment: Normal  Communication   Communication: No difficulties  Cognition Arousal/Alertness: Awake/alert Behavior During Therapy: WFL for tasks assessed/performed Overall Cognitive Status: Within Functional Limits for tasks assessed                      General Comments      Exercises        Assessment/Plan    PT Assessment Patient needs continued PT services  PT Diagnosis Difficulty walking;Generalized weakness;Acute pain   PT Problem List Decreased strength;Decreased balance;Decreased mobility;Decreased  activity tolerance;Pain  PT Treatment Interventions Gait training;Stair training;Functional mobility training;Therapeutic activities;Therapeutic exercise;Balance training;Patient/family education   PT Goals (Current goals can be found in the Care Plan section) Acute Rehab PT Goals Patient Stated Goal: to return home PT Goal Formulation: With patient Time For Goal Achievement: 03/05/15 Potential to Achieve Goals: Good    Frequency Min 3X/week   Barriers to discharge        Co-evaluation               End of  Session   Activity Tolerance: Patient limited by pain;Patient limited by lethargy Patient left: in chair;with call bell/phone within reach Nurse Communication: Patient requests pain meds         Time: 0821-0846 PT Time Calculation (min) (ACUTE ONLY): 25 min   Charges:   PT Evaluation $Initial PT Evaluation Tier I: 1 Procedure PT Treatments $Therapeutic Activity: 8-22 mins   PT G Codes:        Vista Deck 02/26/2015, 8:57 AM

## 2015-02-26 NOTE — Progress Notes (Signed)
[facility] Progress Note  Subjective: Patient complaining more of dysphagia today even for liquids Liquids  Objective: Vital signs in last 24 hours: Temp:  [98.4 F (36.9 C)-99.1 F (37.3 C)] 99.1 F (37.3 C) (07/29 0412) Pulse Rate:  [109-125] 109 (07/29 0412) Resp:  [17-18] 18 (07/29 0412) BP: (129-143)/(63-93) 143/63 mmHg (07/29 0412) SpO2:  [97 %-100 %] 100 % (07/29 0412) Weight:  [83.144 kg (183 lb 4.8 oz)] 83.144 kg (183 lb 4.8 oz) (07/29 0412) Weight change: 0.408 kg (14.4 oz)   PE: Unchanged  Lab Results: Results for orders placed or performed during the hospital encounter of 02/21/15 (from the past 24 hour(s))  Glucose, capillary     Status: Abnormal   Collection Time: 02/25/15 11:49 AM  Result Value Ref Range   Glucose-Capillary 225 (H) 65 - 99 mg/dL   Comment 1 Notify RN   Glucose, capillary     Status: Abnormal   Collection Time: 02/25/15  4:43 PM  Result Value Ref Range   Glucose-Capillary 236 (H) 65 - 99 mg/dL   Comment 1 Notify RN    Comment 2 Document in Chart   Glucose, capillary     Status: Abnormal   Collection Time: 02/25/15  8:03 PM  Result Value Ref Range   Glucose-Capillary 220 (H) 65 - 99 mg/dL   Comment 1 Notify RN    Comment 2 Document in Chart   Glucose, capillary     Status: None   Collection Time: 02/26/15 12:04 AM  Result Value Ref Range   Glucose-Capillary 96 65 - 99 mg/dL   Comment 1 Notify RN   Glucose, capillary     Status: Abnormal   Collection Time: 02/26/15  4:01 AM  Result Value Ref Range   Glucose-Capillary 112 (H) 65 - 99 mg/dL   Comment 1 Notify RN   CBC     Status: Abnormal   Collection Time: 02/26/15  4:50 AM  Result Value Ref Range   WBC 18.5 (H) 4.0 - 10.5 K/uL   RBC 5.21 (H) 3.87 - 5.11 MIL/uL   Hemoglobin 14.1 12.0 - 15.0 g/dL   HCT 95.6 38.7 - 56.4 %   MCV 83.1 78.0 - 100.0 fL   MCH 27.1 26.0 - 34.0 pg   MCHC 32.6 30.0 - 36.0 g/dL   RDW 33.2 (H) 95.1 - 88.4 %   Platelets 289 150 - 400 K/uL   Comprehensive metabolic panel     Status: Abnormal   Collection Time: 02/26/15  4:50 AM  Result Value Ref Range   Sodium 137 135 - 145 mmol/L   Potassium 3.2 (L) 3.5 - 5.1 mmol/L   Chloride 103 101 - 111 mmol/L   CO2 25 22 - 32 mmol/L   Glucose, Bld 145 (H) 65 - 99 mg/dL   BUN 7 6 - 20 mg/dL   Creatinine, Ser 1.66 0.44 - 1.00 mg/dL   Calcium 9.0 8.9 - 06.3 mg/dL   Total Protein 7.6 6.5 - 8.1 g/dL   Albumin 2.9 (L) 3.5 - 5.0 g/dL   AST 13 (L) 15 - 41 U/L   ALT 6 (L) 14 - 54 U/L   Alkaline Phosphatase 123 38 - 126 U/L   Total Bilirubin 1.1 0.3 - 1.2 mg/dL   GFR calc non Af Amer >60 >60 mL/min   GFR calc Af Amer >60 >60 mL/min   Anion gap 9 5 - 15  Glucose, capillary     Status: Abnormal   Collection Time: 02/26/15  8:14 AM  Result Value Ref Range   Glucose-Capillary 164 (H) 65 - 99 mg/dL   Comment 1 Notify RN     Studies/Results: Dg Esophagus  02/25/2015   CLINICAL DATA:  Esophageal stricture and dilation. Esophageal ulcer in esophagitis patient feels the it food and liquids are stuck in her chest.  EXAM: ESOPHOGRAM/BARIUM SWALLOW  TECHNIQUE: Combined double contrast and single contrast examination performed using effervescent crystals, thick barium liquid, and thin barium liquid.  FLUOROSCOPY TIME:  Radiation Exposure Index (as provided by the fluoroscopic device): 506.65 uGy*m2  COMPARISON:  Esophagram Wm 07/06/2006  FINDINGS: Initial laryngeal images demonstrate no significant laryngeal penetration or aspiration.  The distal esophagus is dilated. There is mucosal irregularity suggesting hypertrophy. A distal esophageal stricture is again noted. This may be functional as there is intermittent opening of the GE junction to a more normal caliber.  Esophageal motility is disorganized with significant stasis of contrast in the distal esophagus.  A 13 mm barium tablet was administered. This passed freely through the GE junction.  Dense coronary artery calcifications are noted.  IMPRESSION:  1. Marked esophageal dilation and mucosal hypertrophy without a definite focal lesion. 2. 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 The GE junction does open intermittently to what appears to be a relatively normal caliber. 3. No definite reflux was present. However, the patient did belch several times during the exam with significant stasis in the distal esophagus.   Electronically Signed   By: Marin Roberts M.D.   On: 02/25/2015 09:11   US Abdomen Limited  02/25/2015   CLINICAL DATA:  Right upper quadrant abdominal pain since 02/21/2015  EXAM: US ABDOMEN LIMITED - RIGHT UPPER QUADRANT  COMPARISON:  CT abdomen pelvis dated 02/21/2015  FINDINGS: Gallbladder:  No gallstones, gallbladder wall thickening, or pericholecystic fluid. Negative sonographic Murphy's sign.  Common bile duct:  Diameter: 5 mm  Liver:  No focal lesion identified. Within normal limits in parenchymal echogenicity.  IMPRESSION: Negative right upper quadrant ultrasound.   Electronically Signed   By: Charline Bills M.D.   On: 02/25/2015 14:33      Assessment: Nausea vomiting in diabetic ketoacidosis with component of dysphagia and at least functional stricture suggested on barium swallow  Plan: Will plan on EGD with possible dilatation today if possible if not tomorrow morning.    [name] [name] C 02/26/2015, 9:01 AM  Pager [phone] If no answer or after 5 PM call [phone]

## 2015-02-26 NOTE — Op Note (Signed)
[facility] [address] [place], [address]   ENDOSCOPY PROCEDURE REPORT  PATIENT: [name], [name]  MR#: [id number] BIRTHDATE: Nov 19, 1965 , 49  yrs. old GENDER: female ENDOSCOPIST: Dorena Cookey, MD REFERRED BY: PROCEDURE DATE:  03/14/15 PROCEDURE: ASA CLASS: INDICATIONS:  nausea vomiting dysphagia, abnormal barium swallow MEDICATIONS: 50 g fentanyl, formal grams Versed. TOPICAL ANESTHETIC:  DESCRIPTION OF PROCEDURE: After the risks benefits and alternatives of the procedure were thoroughly explained, informed consent was obtained.  The Pentax Gastroscope [id number] endoscope was introduced through the mouth and advanced to the second portion of the duodenum , Without limitations.  The instrument was slowly withdrawn as the mucosa was fully examined. Estimated blood loss is zero unless otherwise noted in this procedure report.    esophagus: Somewhat dilated. Diffuse exudative esophagitis with confluently exudate distally from the GE junction extending up to about 25 cm with linear erosions and patchy areas of exudate. This appears most consistent with severe reflux esophagitis with no typical candidal plaques but brushings were taken to rule out Candida. She appeared to have minimal if any distal stricture and I decided not to dilate because of the significant inflammation. Stomach: Normal except considerable amount of clear liquid pooling in the dependent portion Duodenum: Normal              The scope was then withdrawn from the patient and the procedure completed.  COMPLICATIONS: There were no immediate complications.  ENDOSCOPIC IMPRESSION: severe distal esophagitis most consistent with gastroesophageal reflux possibly secondary to delayed gastric emptying from suspected diabetic gastroparesis rule out Candida.  RECOMMENDATIONS: discontinue famotidine and start IV Protonix Will start Reglan IV . Try full liquid diet Formal gastric empty  study early next week.  REPEAT EXAM:  eSigned:  Dorena Cookey, MD 2015-03-14 4:21 PM    CC:  CPT CODES: ICD CODES:  The ICD and CPT codes recommended by this software are interpretations from the data that the clinical staff has captured with the software.  The verification of the translation of this report to the ICD and CPT codes and modifiers is the sole responsibility of the health care institution and practicing physician where this report was generated.  [employer] will not be held responsible for the validity of the ICD and CPT codes included on this report.  AMA assumes no liability for data contained or not contained herein. CPT is a Publishing rights manager of the [employer].  PATIENT NAME:  [name], [name] MR#: [id number]

## 2015-02-26 NOTE — Progress Notes (Signed)
GI follow-up: EGD done. She has severe erosive esophagitis most consistent with reflux from the GE junction to about 25 cm from the incisors with confluent exudate distally and linear exudate covered erosions proximally. I can't detect minimal if any stricture or ring. No typical candidal plaques but brush biopsies taken for yeast. Fair amount of fluid in the stomach no retained food in stomach pylorus and duodenum relatively normal.  I'm not sure she's having true dysphagia but possibly some odynophagia from severe esophagitis. I think she may well have diabetic gastroparesis. We'll discontinue famotidine and start her on a proton pump inhibitor IV twice a day, low-dose Reglan and advance as tolerated and we'll set up gastric emptying study for early next week.

## 2015-02-27 DIAGNOSIS — E876 Hypokalemia: Secondary | ICD-10-CM

## 2015-02-27 DIAGNOSIS — E1165 Type 2 diabetes mellitus with hyperglycemia: Secondary | ICD-10-CM

## 2015-02-27 LAB — BASIC METABOLIC PANEL
Anion gap: 5 (ref 5–15)
BUN: 8 mg/dL (ref 6–20)
CALCIUM: 8.7 mg/dL — AB (ref 8.9–10.3)
CO2: 27 mmol/L (ref 22–32)
Chloride: 105 mmol/L (ref 101–111)
Creatinine, Ser: 0.82 mg/dL (ref 0.44–1.00)
GFR calc Af Amer: >60 mL/min (ref >60)
GLUCOSE: 99 mg/dL (ref 65–99)
Potassium: 3.3 mmol/L — ABNORMAL LOW (ref 3.5–5.1)
Sodium: 137 mmol/L (ref 135–145)

## 2015-02-27 LAB — CBC
HEMATOCRIT: 39.6 % (ref 36.0–46.0)
Hemoglobin: 13 g/dL (ref 12.0–15.0)
MCH: 26.8 pg (ref 26.0–34.0)
MCHC: 32.8 g/dL (ref 30.0–36.0)
MCV: 81.6 fL (ref 78.0–100.0)
PLATELETS: 262 10*3/uL (ref 150–400)
RBC: 4.85 MIL/uL (ref 3.87–5.11)
RDW: 17.5 % — ABNORMAL HIGH (ref 11.5–15.5)
WBC: 11 10*3/uL — ABNORMAL HIGH (ref 4.0–10.5)

## 2015-02-27 LAB — GLUCOSE, CAPILLARY
GLUCOSE-CAPILLARY: 66 mg/dL (ref 65–99)
GLUCOSE-CAPILLARY: 126 mg/dL — AB (ref 65–99)
GLUCOSE-CAPILLARY: 190 mg/dL — AB (ref 65–99)
Glucose-Capillary: 125 mg/dL — ABNORMAL HIGH (ref 65–99)
Glucose-Capillary: 191 mg/dL — ABNORMAL HIGH (ref 65–99)
Glucose-Capillary: 193 mg/dL — ABNORMAL HIGH (ref 65–99)
Glucose-Capillary: 211 mg/dL — ABNORMAL HIGH (ref 65–99)

## 2015-02-27 MED ORDER — METOCLOPRAMIDE HCL 5 MG/ML IJ SOLN
10.0000 mg | Freq: Four times a day (QID) | INTRAMUSCULAR | Status: DC
  Administered 2015-02-27 – 2015-03-02 (×12): 10 mg via INTRAVENOUS
  Filled 2015-02-27 (×16): qty 2

## 2015-02-27 MED ORDER — SUCRALFATE 1 GM/10ML PO SUSP
1.0000 g | Freq: Three times a day (TID) | ORAL | Status: DC
  Administered 2015-02-27 – 2015-03-02 (×10): 1 g via ORAL
  Filled 2015-02-27 (×15): qty 10

## 2015-02-27 NOTE — Progress Notes (Signed)
PROGRESS NOTE  [name] [name] JQZ:[id number] DOB: June 29, 1966 DOA: 02/21/2015 PCP: Lonia Blood, MD  HPI/Recap of past 24 hours:  Still with Dysphagia, very poor PO Intake, odynophagia  Assessment/Plan: Mild DKA /type 2 diabetes mellitus with insulin therapy -Suspect precipitated by infectious processes likely combination of suspected UTI with gastroparesis symptoms; treat underlying causes -HgbA1c 7.5 -resolved, off insulin drip -on lantus/SSI, stable -Lactic acidosis: resolved, Negative acetaminophen level, negative salicylate level.   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 -resumed PO Metoprolol    Acute renal failure on CKD, stage III -from UTI and prerenal due to dehydration -cr on admission 1.72, normalized.   Citrobacter UTI --Completed 5day course of IV rocephin today, Stopped 7/29   Rheumatoid arthritis -Not on immunosuppressants/DMARDs prior to admission   History of NSTEMI w/ DES to cfx May 2014 -stable -Continue beta blocker as above   h/o Anemia -Baseline hemoglobin around 12.9 and current hemoglobin 15.6 consistent with hemoconcentration from profound volume depletion -Preadmission iron on hold   Tobacco abuse -Counseled on cessation   Hyperlipidemia -Not on medications prior to admission  Code Status: full Family Communication: none at  bedside Disposition Plan: home when improved, ? monday   Consultants:  [facility]  Procedures:  PICC 7/24  Antibiotics:  Cipro/flagyl from admission to 7/26  Rocephin from 7/27   Objective: BP 121/59 mmHg  Pulse 86  Temp(Src) 98.4 F (36.9 C) (Oral)  Resp 18  Ht 5' (1.524 m)  Wt 83.28 kg (183 lb 9.6 oz)  BMI 35.86 kg/m2  SpO2 96%  LMP 01/12/2015  Intake/Output Summary (Last 24 hours) at 02/27/15 1204 Last data filed at 02/26/15 2216  Gross per 24 hour  Intake    240 ml  Output    200 ml  Net     40 ml   Filed Weights   02/25/15 0434 02/26/15 0412 02/27/15 0419  Weight: 82.736 kg (182 lb 6.4 oz) 83.144 kg (183 lb 4.8 oz) 83.28 kg (183 lb 9.6 oz)    Exam:   General:  AAOx3, no distress  Cardiovascular: sinus tachycardia  Respiratory: CTABL  Abdomen: epigastric tender, no rebound, Soft/ND, positive BS  Musculoskeletal: No Edema  Neuro: aaox3  Data Reviewed: Basic Metabolic Panel:  Recent Labs Lab 02/23/15 0400 02/24/15 0555 02/25/15 0439 02/26/15 0450 02/27/15 0503  NA 138 135 139 137 137  K 3.6 3.6 3.8 3.2* 3.3*  CL 102 101 105 103 105  CO2 25 24 26 25 27   GLUCOSE 133* 204* 167* 145* 99  BUN 6 6 9 7 8   CREATININE 0.75 0.91 0.82 0.84 0.82  CALCIUM 9.0 8.8* 9.2 9.0 8.7*  MG  --  1.5*  --   --   --    Liver Function Tests:  Recent Labs Lab 02/22/15 0440 02/23/15 0400 02/24/15 0555 02/25/15 0439 02/26/15 0450  AST 18 22 16 15  13*  ALT 6* 8* 8* 8* 6*  ALKPHOS 120 136* 129* 128* 123  BILITOT 0.6 0.7 1.0  1.0 1.1  PROT 6.6 7.8 8.0 7.9 7.6  ALBUMIN 2.7* 3.2* 3.2* 3.1* 2.9*    Recent Labs Lab 02/21/15 0357  LIPASE 20*   No results for input(s): AMMONIA in the last 168 hours. CBC:  Recent Labs Lab 02/23/15 0400 02/24/15 0555 02/25/15 0439 02/26/15 0450 02/27/15 0500  WBC 14.8* 12.2* 19.6* 18.5* 11.0*  HGB 13.9 14.2 14.8 14.1 13.0  HCT 42.2 43.6 45.2 43.3 39.6  MCV 82.9 83.5 83.4 83.1 81.6  PLT 357 308 335 289 262    Cardiac Enzymes:   No results for input(s): CKTOTAL, CKMB, CKMBINDEX, TROPONINI in the last 168 hours. BNP (last 3 results) No results for input(s): BNP in the last 8760 hours.  ProBNP (last 3 results) No results for input(s): PROBNP in the last 8760 hours.  CBG:  Recent Labs Lab 02/26/15 2011 02/27/15 0020 02/27/15 0409 02/27/15 0754 02/27/15 1031  GLUCAP 218* 193* 125* 66 126*    Recent Results (from the past 240 hour(s))  Urine culture     Status: None   Collection Time: 02/21/15  4:32 AM  Result Value Ref Range Status   Specimen Description URINE, RANDOM  Final   Special Requests NONE  Final   Culture >=100,000 COLONIES/mL CITROBACTER KOSERI  Final   Report Status 02/23/2015 FINAL  Final   Organism ID, Bacteria CITROBACTER KOSERI  Final      Susceptibility   Citrobacter koseri - MIC*    CEFAZOLIN <=4 SENSITIVE Sensitive     CEFTRIAXONE <=1 SENSITIVE Sensitive     CIPROFLOXACIN <=0.25 SENSITIVE Sensitive     GENTAMICIN <=1 SENSITIVE Sensitive     IMIPENEM <=0.25 SENSITIVE Sensitive     NITROFURANTOIN 32 SENSITIVE Sensitive     TRIMETH/SULFA <=20 SENSITIVE Sensitive     PIP/TAZO <=4 SENSITIVE Sensitive     * >=100,000 COLONIES/mL CITROBACTER KOSERI  MRSA PCR Screening     Status: None   Collection Time: 02/21/15 12:22 PM  Result Value Ref Range Status   MRSA by PCR NEGATIVE NEGATIVE Final    Comment:        The GeneXpert MRSA Assay (FDA approved for NASAL specimens only), is one component of a comprehensive MRSA colonization surveillance program. It is not intended to diagnose MRSA infection nor to guide or monitor treatment for MRSA infections.      Studies: No results found.  Scheduled Meds: . brinzolamide  1 drop Left Eye TID  . enoxaparin (LOVENOX) injection  40 mg Subcutaneous Q24H  . famotidine (PEPCID) IV  10 mg Intravenous Q12H  . folic acid  1 mg Intravenous Daily  . insulin aspart  0-15 Units Subcutaneous 6 times per day  . insulin  glargine  10 Units Subcutaneous BID  . metoCLOPramide (REGLAN) injection  10 mg Intravenous 4 times per day  . metoprolol tartrate  50 mg Oral BID  . pantoprazole (PROTONIX) IV  40 mg Intravenous Q12H  . sodium chloride  10-40 mL Intracatheter Q12H  . sodium chloride  3 mL Intravenous Q12H  . sucralfate  1 g Oral TID WC & HS  . thiamine  100 mg Intravenous Daily    Continuous Infusions:     Time spent:  Zannie Cove MD,  [facility] Pager [phone] If 7PM-7AM, please contact night-coverage at [email or web address], password [facility] 02/27/2015, 12:04 PM  LOS: 6 days

## 2015-02-27 NOTE — Progress Notes (Signed)
Physical Therapy Treatment Patient Details Name: [name] [name] MRN: [id number] DOB: 03-08-1966 Today's Date: 03-06-2015    History of Present Illness 49 y/o female presented to the ER with acute onset of intractable nausea and vomiting with diarrhea. These symptoms were associated with sharp/ diffuse abdominal pain and began around 9 PM on 7/23. Patient endorses watery diarrhea with greater than 20 episodes of emesis and diarrhea since onset of symptoms.    PT Comments    Patient did much better with gait today. Still with slight losses of balance during gait, but able to regain without assistance and feel this is related to generalized weakness related to illness.  Do not feel further PT is warranted, but do feel patient needs to ambulate BID with nursing staff/family.   PT will sign off.     Follow Up Recommendations  No PT follow up     Equipment Recommendations  None recommended by PT    Recommendations for Other Services       Precautions / Restrictions Precautions Precautions: Fall Restrictions Weight Bearing Restrictions: No    Mobility  Bed Mobility Overal bed mobility: Independent                Transfers Overall transfer level: Modified independent                  Ambulation/Gait Ambulation/Gait assistance: Supervision Ambulation Distance (Feet): 100 Feet Assistive device: None Gait Pattern/deviations: Step-through pattern     General Gait Details: slight loss of balance both right and left during gait, but able to self-regain.  Feel this is a product of generalized weakness and not a true balance issue.     Stairs            Wheelchair Mobility    Modified Rankin (Stroke Patients Only)       Balance                                    Cognition Arousal/Alertness: Awake/alert Behavior During Therapy: WFL for tasks assessed/performed Overall Cognitive Status: Within Functional Limits for tasks assessed                       Exercises      General Comments        Pertinent Vitals/Pain Pain Score: 6  Pain Location: abdominal pain Pain Descriptors / Indicators: Aching Pain Intervention(s): Premedicated before session;Monitored during session    Home Living                      Prior Function            PT Goals (current goals can now be found in the care plan section) Progress towards PT goals: Goals met/education completed, patient discharged from PT    Frequency       PT Plan Current plan remains appropriate    Co-evaluation             End of Session   Activity Tolerance: Patient tolerated treatment well Patient left: in bed;with call bell/phone within reach     Time: 1120-1129 PT Time Calculation (min) (ACUTE ONLY): 9 min  Charges:  $Gait Training: 8-22 mins                    G Codes:      [name] 2015-03-06, 11:35 AM  2015-03-06  [place], [place]

## 2015-02-27 NOTE — Progress Notes (Signed)
Pt would like her Lantus insulin in the morning and with dinner, as she states this is the way she takes it at home.  Pt has tolerated diet all day. She called out for phenergan at 1730, but stated she wasn't nauseated and she just wanted phenergan in case she gets nausea later.  Phenergan was held and pt continues to report no N&V.

## 2015-02-27 NOTE — Progress Notes (Signed)
[facility] Progress Note  Subjective: Patient taking in minimal by mouth's on her full liquid diet, still somewhat afraid to eat  Objective: Vital signs in last 24 hours: Temp:  [98.4 F (36.9 C)-99 F (37.2 C)] 98.4 F (36.9 C) (07/30 0419) Pulse Rate:  [86-119] 86 (07/30 0419) Resp:  [18-27] 18 (07/30 0419) BP: (111-198)/(59-114) 121/59 mmHg (07/30 0419) SpO2:  [95 %-100 %] 96 % (07/30 0419) Weight:  [83.28 kg (183 lb 9.6 oz)] 83.28 kg (183 lb 9.6 oz) (07/30 0419) Weight change: 0.136 kg (4.8 oz)   1. PE: Unchanged  Lab Results: Results for orders placed or performed during the hospital encounter of 02/21/15 (from the past 24 hour(s))  Glucose, capillary     Status: Abnormal   Collection Time: 02/26/15 11:47 AM  Result Value Ref Range   Glucose-Capillary 181 (H) 65 - 99 mg/dL   Comment 1 Notify RN   Glucose, capillary     Status: Abnormal   Collection Time: 02/26/15  5:38 PM  Result Value Ref Range   Glucose-Capillary 174 (H) 65 - 99 mg/dL   Comment 1 Notify RN    Comment 2 Document in Chart   Glucose, capillary     Status: Abnormal   Collection Time: 02/26/15  8:11 PM  Result Value Ref Range   Glucose-Capillary 218 (H) 65 - 99 mg/dL   Comment 1 Notify RN    Comment 2 Document in Chart   Glucose, capillary     Status: Abnormal   Collection Time: 02/27/15 12:20 AM  Result Value Ref Range   Glucose-Capillary 193 (H) 65 - 99 mg/dL  Glucose, capillary     Status: Abnormal   Collection Time: 02/27/15  4:09 AM  Result Value Ref Range   Glucose-Capillary 125 (H) 65 - 99 mg/dL  CBC     Status: Abnormal   Collection Time: 02/27/15  5:00 AM  Result Value Ref Range   WBC 11.0 (H) 4.0 - 10.5 K/uL   RBC 4.85 3.87 - 5.11 MIL/uL   Hemoglobin 13.0 12.0 - 15.0 g/dL   HCT 79.8 92.1 - 19.4 %   MCV 81.6 78.0 - 100.0 fL   MCH 26.8 26.0 - 34.0 pg   MCHC 32.8 30.0 - 36.0 g/dL   RDW 17.4 (H) 08.1 - 44.8 %   Platelets 262 150 - 400 K/uL  Basic metabolic panel      Status: Abnormal   Collection Time: 02/27/15  5:03 AM  Result Value Ref Range   Sodium 137 135 - 145 mmol/L   Potassium 3.3 (L) 3.5 - 5.1 mmol/L   Chloride 105 101 - 111 mmol/L   CO2 27 22 - 32 mmol/L   Glucose, Bld 99 65 - 99 mg/dL   BUN 8 6 - 20 mg/dL   Creatinine, Ser 1.85 0.44 - 1.00 mg/dL   Calcium 8.7 (L) 8.9 - 10.3 mg/dL   GFR calc non Af Amer >60 >60 mL/min   GFR calc Af Amer >60 >60 mL/min   Anion gap 5 5 - 15  Glucose, capillary     Status: None   Collection Time: 02/27/15  7:54 AM  Result Value Ref Range   Glucose-Capillary 66 65 - 99 mg/dL   Comment 1 Notify RN     Studies/Results: US Abdomen Limited  02/25/2015   CLINICAL DATA:  Right upper quadrant abdominal pain since 02/21/2015  EXAM: US ABDOMEN LIMITED - RIGHT UPPER QUADRANT  COMPARISON:  CT abdomen pelvis dated 02/21/2015  FINDINGS: Gallbladder:  No gallstones, gallbladder wall thickening, or pericholecystic fluid. Negative sonographic Murphy's sign.  Common bile duct:  Diameter: 5 mm  Liver:  No focal lesion identified. Within normal limits in parenchymal echogenicity.  IMPRESSION: Negative right upper quadrant ultrasound.   Electronically Signed   By: Charline Bills M.D.   On: 02/25/2015 14:33      Assessment: 1. Diabetic gastroparesis 2. Severe erosive esophagitis rule out Candida 3. Minimal if any stricture on EGD yesterday felt too inflamed to perform dilatation  Plan: 1. Increase Reglan to full dose 2. Continue Protonix 3. Hold at full liquid diet for now 4. Await brushings for Candida 5. Gastric emptying study Monday    Walter Min C 02/27/2015, 9:16 AM  Pager [phone] If no answer or after 5 PM call [phone]

## 2015-02-28 DIAGNOSIS — E1143 Type 2 diabetes mellitus with diabetic autonomic (poly)neuropathy: Principal | ICD-10-CM

## 2015-02-28 LAB — GLUCOSE, CAPILLARY
GLUCOSE-CAPILLARY: 149 mg/dL — AB (ref 65–99)
GLUCOSE-CAPILLARY: 195 mg/dL — AB (ref 65–99)
GLUCOSE-CAPILLARY: 221 mg/dL — AB (ref 65–99)
Glucose-Capillary: 180 mg/dL — ABNORMAL HIGH (ref 65–99)
Glucose-Capillary: 216 mg/dL — ABNORMAL HIGH (ref 65–99)
Glucose-Capillary: 228 mg/dL — ABNORMAL HIGH (ref 65–99)
Glucose-Capillary: 72 mg/dL (ref 65–99)

## 2015-02-28 LAB — CREATININE, SERUM: Creatinine, Ser: 0.93 mg/dL (ref 0.44–1.00)

## 2015-02-28 NOTE — Progress Notes (Addendum)
PROGRESS NOTE  [name] [name] PPJ:[id number] DOB: 12-21-1965 DOA: 02/21/2015 PCP: Lonia Blood, MD  HPI/Recap of past 24 hours:  Still with Dysphagia, very poor PO Intake, odynophagia  Assessment/Plan: Mild DKA /type 2 diabetes mellitus with insulin therapy -Suspect precipitated by infectious processes likely combination of suspected UTI with gastroparesis symptoms; treat underlying causes -HgbA1c 7.5 -resolved, off insulin drip -on lantus/SSI, stable -resolved  Severe esophagitis and suspected gastroparesis  -EGD 7/29, continue IV PPI BID -Esophogram/Ba swallow with Marked esophageal dilation and mucosal hypertrophy without a definite focal lesion, Marked dysfunction at the GE junction  -continue carafate, now tolerating regular diet, plan for gastric emptying scan monday -continue reglan TID AC -appreciate Gi input  Severe dehydration with hypokalemia -replace   Leukocytosis -etiology unclear, afebrile, completed Rx for UTI -CT abd and US-unremarkable  Mild chronic systolic heart 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 -resumed PO Metoprolol    Acute renal failure on CKD, stage III -from UTI and prerenal due to dehydration -cr on admission 1.72, normalized.   Citrobacter UTI --Completed 5day course of IV rocephin today, Stopped 7/29   Rheumatoid arthritis -Not on immunosuppressants/DMARDs prior to admission   History of NSTEMI w/ DES to cfx May 2014 -stable -Continue beta blocker as above   h/o Anemia -Baseline hemoglobin around 12.9 and current hemoglobin 15.6 consistent with hemoconcentration from profound volume depletion -Preadmission iron on hold   Tobacco abuse -Counseled on cessation   Hyperlipidemia -Not on medications prior to admission  Code Status: full Family Communication: none at bedside Disposition Plan: home when improved, ?  monday   Consultants:  [facility]  Procedures:  PICC 7/24  Antibiotics:  Cipro/flagyl from admission to 7/26  Rocephin from 7/27   Objective: BP 117/54 mmHg  Pulse 79  Temp(Src) 98.4 F (36.9 C) (Oral)  Resp 18  Ht 5' (1.524 m)  Wt 83.689 kg (184 lb 8 oz)  BMI 36.03 kg/m2  SpO2 95%  LMP 01/12/2015  Intake/Output Summary (Last 24 hours) at 02/28/15 1253 Last data filed at 02/28/15 0900  Gross per 24 hour  Intake    480 ml  Output    400 ml  Net     80 ml   Filed Weights   02/26/15 0412 02/27/15 0419 02/28/15 0359  Weight: 83.144 kg (183 lb 4.8 oz) 83.28 kg (183 lb 9.6 oz) 83.689 kg (184 lb 8 oz)    Exam:   General:  AAOx3, no distress  Cardiovascular: sinus tachycardia  Respiratory: CTABL  Abdomen: epigastric tender, no rebound, Soft/ND, positive BS  Musculoskeletal: No Edema  Neuro: aaox3  Data Reviewed: Basic Metabolic Panel:  Recent Labs Lab 02/23/15 0400 02/24/15 0555 02/25/15 0439 02/26/15 0450 02/27/15 0503 02/28/15 0355  NA 138 135 139 137 137  --   K 3.6 3.6 3.8 3.2* 3.3*  --   CL 102 101 105 103 105  --   CO2 25 24 26 25 27   --   GLUCOSE 133* 204* 167* 145* 99  --   BUN 6 6 9 7 8   --   CREATININE 0.75 0.91 0.82 0.84 0.82 0.93  CALCIUM 9.0 8.8* 9.2 9.0 8.7*  --   MG  --  1.5*  --   --   --   --    Liver Function Tests:  Recent Labs Lab 02/22/15 0440 02/23/15 0400 02/24/15 0555 02/25/15 0439 02/26/15 0450  AST 18 22 16 15  13*  ALT 6* 8* 8* 8* 6*  ALKPHOS 120 136* 129* 128* 123  BILITOT 0.6 0.7 1.0 1.0 1.1  PROT 6.6 7.8 8.0 7.9 7.6  ALBUMIN 2.7* 3.2* 3.2* 3.1* 2.9*   No results for input(s): LIPASE, AMYLASE in the last 168 hours. No results for input(s): AMMONIA in the last 168 hours. CBC:  Recent Labs Lab 02/23/15 0400 02/24/15 0555 02/25/15 0439 02/26/15 0450 02/27/15 0500  WBC 14.8* 12.2* 19.6* 18.5* 11.0*  HGB 13.9 14.2 14.8 14.1 13.0  HCT 42.2 43.6 45.2 43.3 39.6  MCV 82.9 83.5 83.4 83.1 81.6  PLT 357  308 335 289 262   Cardiac Enzymes:   No results for input(s): CKTOTAL, CKMB, CKMBINDEX, TROPONINI in the last 168 hours. BNP (last 3 results) No results for input(s): BNP in the last 8760 hours.  ProBNP (last 3 results) No results for input(s): PROBNP in the last 8760 hours.  CBG:  Recent Labs Lab 02/27/15 2040 02/28/15 0026 02/28/15 0343 02/28/15 0921 02/28/15 1137  GLUCAP 191* 221* 72 149* 228*    Recent Results (from the past 240 hour(s))  Urine culture     Status: None   Collection Time: 02/21/15  4:32 AM  Result Value Ref Range Status   Specimen Description URINE, RANDOM  Final   Special Requests NONE  Final   Culture >=100,000 COLONIES/mL CITROBACTER KOSERI  Final   Report Status 02/23/2015 FINAL  Final   Organism ID, Bacteria CITROBACTER KOSERI  Final      Susceptibility   Citrobacter koseri - MIC*    CEFAZOLIN <=4 SENSITIVE Sensitive     CEFTRIAXONE <=1 SENSITIVE Sensitive     CIPROFLOXACIN <=0.25 SENSITIVE Sensitive     GENTAMICIN <=1 SENSITIVE Sensitive     IMIPENEM <=0.25 SENSITIVE Sensitive     NITROFURANTOIN 32 SENSITIVE Sensitive     TRIMETH/SULFA <=20 SENSITIVE Sensitive     PIP/TAZO <=4 SENSITIVE Sensitive     * >=100,000 COLONIES/mL CITROBACTER KOSERI  MRSA PCR Screening     Status: None   Collection Time: 02/21/15 12:22 PM  Result Value Ref Range Status   MRSA by PCR NEGATIVE NEGATIVE Final    Comment:        The GeneXpert MRSA Assay (FDA approved for NASAL specimens only), is one component of a comprehensive MRSA colonization surveillance program. It is not intended to diagnose MRSA infection nor to guide or monitor treatment for MRSA infections.      Studies: No results found.  Scheduled Meds: . brinzolamide  1 drop Left Eye TID  . enoxaparin (LOVENOX) injection  40 mg Subcutaneous Q24H  . famotidine (PEPCID) IV  10 mg Intravenous Q12H  . folic acid  1 mg Intravenous Daily  . insulin aspart  0-15 Units Subcutaneous 6 times per day   . insulin glargine  10 Units Subcutaneous BID  . metoCLOPramide (REGLAN) injection  10 mg Intravenous 4 times per day  . metoprolol tartrate  50 mg Oral BID  . pantoprazole (PROTONIX) IV  40 mg Intravenous Q12H  . sodium chloride  10-40 mL Intracatheter Q12H  . sodium chloride  3 mL Intravenous Q12H  . sucralfate  1 g Oral TID WC & HS  . thiamine  100 mg Intravenous Daily    Continuous Infusions:     Time spent:  Zannie Cove MD,  [facility] Pager [phone] If 7PM-7AM, please contact night-coverage at [email or web address], password [facility] 02/28/2015, 12:53 PM  LOS: 7 days

## 2015-02-28 NOTE — Progress Notes (Signed)
[facility] Progress Note  Subjective: Feeling better today accident tolerated some chicken and right yesterday without significant nausea or odynophagia  Objective: Vital signs in last 24 hours: Temp:  [98.4 F (36.9 C)-98.7 F (37.1 C)] 98.4 F (36.9 C) (07/31 0359) Pulse Rate:  [79-106] 79 (07/31 0359) Resp:  [18-19] 18 (07/31 0359) BP: (117-145)/(54-69) 117/54 mmHg (07/31 0359) SpO2:  [95 %-100 %] 95 % (07/31 0359) Weight:  [83.689 kg (184 lb 8 oz)] 83.689 kg (184 lb 8 oz) (07/31 0359) Weight change: 0.408 kg (14.4 oz)   PE: Unchanged  Lab Results: Results for orders placed or performed during the hospital encounter of 02/21/15 (from the past 24 hour(s))  Glucose, capillary     Status: Abnormal   Collection Time: 02/27/15 10:31 AM  Result Value Ref Range   Glucose-Capillary 126 (H) 65 - 99 mg/dL   Comment 1 Notify RN   Glucose, capillary     Status: Abnormal   Collection Time: 02/27/15  3:56 PM  Result Value Ref Range   Glucose-Capillary 211 (H) 65 - 99 mg/dL  Glucose, capillary     Status: Abnormal   Collection Time: 02/27/15  6:08 PM  Result Value Ref Range   Glucose-Capillary 190 (H) 65 - 99 mg/dL  Glucose, capillary     Status: Abnormal   Collection Time: 02/27/15  8:40 PM  Result Value Ref Range   Glucose-Capillary 191 (H) 65 - 99 mg/dL  Glucose, capillary     Status: Abnormal   Collection Time: 02/28/15 12:26 AM  Result Value Ref Range   Glucose-Capillary 221 (H) 65 - 99 mg/dL  Glucose, capillary     Status: None   Collection Time: 02/28/15  3:43 AM  Result Value Ref Range   Glucose-Capillary 72 65 - 99 mg/dL  Creatinine, serum     Status: None   Collection Time: 02/28/15  3:55 AM  Result Value Ref Range   Creatinine, Ser 0.93 0.44 - 1.00 mg/dL   GFR calc non Af Amer >60 >60 mL/min   GFR calc Af Amer >60 >60 mL/min    Studies/Results: No results found.    Assessment: 1. Severe erosive esophagitis questionable mild stricture not  dilated due to severity of inflammation at the GE junction, rule out concomitant candida esophagitis 2. Very likely diabetic gastroparesis  Plan:  1. I would keep her on IV Reglan and Protonix for at least another day or 2. 2. Scheduled for nuclear medicine gastric imaging study. We will leave her on Reglan due to the severity of her symptoms 3. Await brushings for Candida 4. Advance diet as tolerated.    [name] [name] C 02/28/2015, 9:15 AM  Pager [phone] If no answer or after 5 PM call [phone]

## 2015-03-01 ENCOUNTER — Inpatient Hospital Stay (HOSPITAL_COMMUNITY): Payer: Medicaid Other

## 2015-03-01 ENCOUNTER — Encounter (HOSPITAL_COMMUNITY): Payer: Self-pay | Admitting: Gastroenterology

## 2015-03-01 DIAGNOSIS — R197 Diarrhea, unspecified: Secondary | ICD-10-CM

## 2015-03-01 DIAGNOSIS — R112 Nausea with vomiting, unspecified: Secondary | ICD-10-CM

## 2015-03-01 LAB — GLUCOSE, CAPILLARY
GLUCOSE-CAPILLARY: 123 mg/dL — AB (ref 65–99)
GLUCOSE-CAPILLARY: 211 mg/dL — AB (ref 65–99)
GLUCOSE-CAPILLARY: 211 mg/dL — AB (ref 65–99)
GLUCOSE-CAPILLARY: 91 mg/dL (ref 65–99)
Glucose-Capillary: 266 mg/dL — ABNORMAL HIGH (ref 65–99)
Glucose-Capillary: 116 mg/dL — ABNORMAL HIGH (ref 65–99)

## 2015-03-01 MED ORDER — TECHNETIUM TC 99M SULFUR COLLOID
2.0000 | Freq: Once | INTRAVENOUS | Status: AC | PRN
  Administered 2015-03-01: 1.9 via ORAL

## 2015-03-01 MED ORDER — PANTOPRAZOLE SODIUM 40 MG PO TBEC
40.0000 mg | DELAYED_RELEASE_TABLET | Freq: Two times a day (BID) | ORAL | Status: DC
  Administered 2015-03-01 – 2015-03-02 (×3): 40 mg via ORAL
  Filled 2015-03-01 (×3): qty 1

## 2015-03-01 MED ORDER — FOLIC ACID 1 MG PO TABS
1.0000 mg | ORAL_TABLET | Freq: Every day | ORAL | Status: DC
  Administered 2015-03-02: 1 mg via ORAL
  Filled 2015-03-01: qty 1

## 2015-03-01 MED ORDER — PROMETHAZINE HCL 12.5 MG PO TABS: 12.5000 mg | ORAL_TABLET | Freq: Four times a day (QID) | ORAL | Status: DC | PRN

## 2015-03-01 MED ORDER — INSULIN GLARGINE 100 UNIT/ML ~~LOC~~ SOLN
12.0000 [IU] | Freq: Two times a day (BID) | SUBCUTANEOUS | Status: DC
  Administered 2015-03-01 – 2015-03-02 (×2): 12 [IU] via SUBCUTANEOUS
  Filled 2015-03-01 (×3): qty 0.12

## 2015-03-01 MED ORDER — VITAMIN B-1 100 MG PO TABS
100.0000 mg | ORAL_TABLET | Freq: Every day | ORAL | Status: DC
  Administered 2015-03-02: 100 mg via ORAL
  Filled 2015-03-01: qty 1

## 2015-03-01 NOTE — Progress Notes (Signed)
Subjective: Tearful her gastric emptying test hasn't been done yet. Nausea and vomiting have resolved; she is eating solid foods without much difficulty.  Objective: Vital signs in last 24 hours: Temp:  [98.2 F (36.8 C)-98.8 F (37.1 C)] 98.2 F (36.8 C) (08/01 0418) Pulse Rate:  [81-95] 81 (08/01 0418) Resp:  [16-17] 16 (08/01 0418) BP: (124-128)/(62-76) 124/62 mmHg (08/01 0418) SpO2:  [100 %] 100 % (08/01 0418) Weight:  [83.507 kg (184 lb 1.6 oz)] 83.507 kg (184 lb 1.6 oz) (08/01 0418) Weight change: -0.181 kg (-6.4 oz) Last BM Date: 02/26/15  PE: GEN:  Tearful, upset, histrionic ABD:  Soft, mild epigastric tenderness, active bowel sounds  Lab Results: CBC    Component Value Date/Time   WBC 11.0* 02/27/2015 0500   RBC 4.85 02/27/2015 0500   HGB 13.0 02/27/2015 0500   HCT 39.6 02/27/2015 0500   PLT 262 02/27/2015 0500   MCV 81.6 02/27/2015 0500   MCH 26.8 02/27/2015 0500   MCHC 32.8 02/27/2015 0500   RDW 17.5* 02/27/2015 0500   LYMPHSABS 2.6 07/02/2012 2211   MONOABS 1.1* 07/02/2012 2211   EOSABS 0.1 07/02/2012 2211   BASOSABS 0.0 07/02/2012 2211   CMP     Component Value Date/Time   NA 137 02/27/2015 0503   K 3.3* 02/27/2015 0503   CL 105 02/27/2015 0503   CO2 27 02/27/2015 0503   GLUCOSE 99 02/27/2015 0503   BUN 8 02/27/2015 0503   CREATININE 0.93 02/28/2015 0355   CALCIUM 8.7* 02/27/2015 0503   PROT 7.6 02/26/2015 0450   ALBUMIN 2.9* 02/26/2015 0450   AST 13* 02/26/2015 0450   ALT 6* 02/26/2015 0450   ALKPHOS 123 02/26/2015 0450   BILITOT 1.1 02/26/2015 0450   GFRNONAA >60 02/28/2015 0355   GFRAA >60 02/28/2015 0355   Assessment:  1.  Nausea and vomiting.  Resolved.  Suspicion for gastroparesis. 2.  Severe erosive esophagitis on endoscopy, suspect incited (or at least exacerbated) by underlying gastroparesis.  Plan:  1.  Gastric emptying study planned for today, but I question the test's validity since she is getting metoclopramide QID  around-the-clock and periodic doses of narcotics. 2.  Will transition medications, metoclopramide and pantoprazole, to oral formulation. 3.  If she continues to do well with diet, she can probably be discharged home late today or early tomorrow from GI perspective; patient herself is very eager to leave today if possible. 4.  Will follow, so long as she is in the hospital.  Once discharged, she can call Dr. Madilyn Fireman with [facility] [phone]) to arrange outpatient follow-up.   Freddy Jaksch 03/01/2015, 11:44 AM   Pager [phone] If no answer or after 5 PM call [phone]

## 2015-03-01 NOTE — Plan of Care (Signed)
Pt continues with episodes of nausea and vomiting this evening after return from gastric emptying studies. Phenergan given with relief. She was NPO this morning for procedure. She ate less than 25% dinner. Continue to monitor po intake.

## 2015-03-01 NOTE — Progress Notes (Signed)
PROGRESS NOTE  [name] [name] [facility]:[id number] DOB: [birth year] DOA: 02/21/2015 PCP: Lonia Blood, MD  HPI/Recap of past 24 hours:  Anxious, upset abt being NPO/Insulin etc, seen in Nuc med  Assessment/Plan: Mild DKA /type 2 diabetes mellitus with insulin therapy -Suspect precipitated by infectious processes likely combination of suspected UTI with gastroparesis symptoms; treat underlying causes -HgbA1c 7.5 -resolved, off insulin drip -on lantus/SSI, stable, increase lantus dose to 12 units BID -resolved  Severe esophagitis and suspected gastroparesis  -EGD 7/29, continue PPI BID -Esophogram/Ba swallow with Marked esophageal dilation and mucosal hypertrophy without a definite focal lesion, Marked dysfunction at the GE junction  -continue carafate, tolerating regular diet, now NPO, awaiting completion of  Gastric emptying scan -continue reglan TID AC -appreciate Gi input  Severe dehydration with hypokalemia -replaced   Leukocytosis -etiology unclear, afebrile, completed Rx for UTI -CT abd and US-unremarkable -improing  Mild chronic systolic heart 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 -resumed PO Metoprolol    Acute renal failure on CKD, stage III -from UTI and prerenal due to dehydration -cr on admission 1.72, normalized.   Citrobacter UTI --Completed 5day course of IV rocephin today, Stopped 7/29   Rheumatoid arthritis -Not on immunosuppressants/DMARDs prior to admission   History of NSTEMI w/ DES to cfx May 2014 -stable -Continue beta blocker as above   h/o Anemia -Baseline hemoglobin around 12.9 and current hemoglobin 15.6 consistent with hemoconcentration from profound volume depletion -Preadmission iron on hold   Tobacco abuse -Counseled on cessation   Hyperlipidemia -Not on medications prior to admission  Code Status: full Family Communication: none at  bedside Disposition Plan: home tomorrow or later today   Consultants:  [facility]  Procedures:  PICC 7/24  Antibiotics:  Cipro/flagyl from admission to 7/26  Rocephin from 7/27   Objective: BP 124/62 mmHg  Pulse 81  Temp(Src) 98.2 F (36.8 C) (Oral)  Resp 16  Ht 5' (1.524 m)  Wt 83.507 kg (184 lb 1.6 oz)  BMI 35.95 kg/m2  SpO2 100%  LMP 01/12/2015  Intake/Output Summary (Last 24 hours) at 03/01/15 1551 Last data filed at 03/01/15 1200  Gross per 24 hour  Intake      0 ml  Output   1000 ml  Net  -1000 ml   Filed Weights   02/27/15 0419 02/28/15 0359 03/01/15 0418  Weight: 83.28 kg (183 lb 9.6 oz) 83.689 kg (184 lb 8 oz) 83.507 kg (184 lb 1.6 oz)    Exam:   General:  AAOx3, no distress  Cardiovascular: sinus tachycardia  Respiratory: CTABL  Abdomen: epigastric tender, no rebound, Soft/ND, positive BS  Musculoskeletal: No Edema  Neuro: aaox3  Data Reviewed: Basic Metabolic Panel:  Recent Labs Lab 02/23/15 0400 02/24/15 0555 02/25/15 0439 02/26/15 0450 02/27/15 0503 02/28/15 0355  NA 138 135 139 137 137  --   K 3.6 3.6 3.8 3.2* 3.3*  --   CL 102 101 105 103 105  --   CO2 25 24 26 25 27   --   GLUCOSE 133* 204* 167* 145* 99  --   BUN 6 6 9 7 8   --   CREATININE 0.75 0.91 0.82 0.84 0.82 0.93  CALCIUM 9.0 8.8* 9.2 9.0 8.7*  --   MG  --  1.5*  --   --   --   --    Liver Function Tests:  Recent Labs Lab 02/23/15 0400 02/24/15 0555 02/25/15 0439 02/26/15 0450  AST 22 16 15  13*  ALT 8* 8* 8* 6*  ALKPHOS 136* 129* 128* 123  BILITOT 0.7 1.0 1.0 1.1  PROT 7.8 8.0 7.9 7.6  ALBUMIN 3.2* 3.2* 3.1* 2.9*   No results for input(s): LIPASE, AMYLASE in the last 168 hours. No results for input(s): AMMONIA in the last 168 hours. CBC:  Recent Labs Lab 02/23/15 0400 02/24/15 0555 02/25/15 0439 02/26/15 0450 02/27/15 0500  WBC 14.8* 12.2* 19.6* 18.5* 11.0*  HGB 13.9 14.2 14.8 14.1 13.0  HCT 42.2 43.6 45.2 43.3 39.6  MCV 82.9 83.5 83.4 83.1  81.6  PLT 357 308 335 289 262   Cardiac Enzymes:   No results for input(s): CKTOTAL, CKMB, CKMBINDEX, TROPONINI in the last 168 hours. BNP (last 3 results) No results for input(s): BNP in the last 8760 hours.  ProBNP (last 3 results) No results for input(s): PROBNP in the last 8760 hours.  CBG:  Recent Labs Lab 02/28/15 1604 02/28/15 1942 02/28/15 2343 03/01/15 0407 03/01/15 1104  GLUCAP 195* 216* 180* 91 266*    Recent Results (from the past 240 hour(s))  Urine culture     Status: None   Collection Time: 02/21/15  4:32 AM  Result Value Ref Range Status   Specimen Description URINE, RANDOM  Final   Special Requests NONE  Final   Culture >=100,000 COLONIES/mL CITROBACTER KOSERI  Final   Report Status 02/23/2015 FINAL  Final   Organism ID, Bacteria CITROBACTER KOSERI  Final      Susceptibility   Citrobacter koseri - MIC*    CEFAZOLIN <=4 SENSITIVE Sensitive     CEFTRIAXONE <=1 SENSITIVE Sensitive     CIPROFLOXACIN <=0.25 SENSITIVE Sensitive     GENTAMICIN <=1 SENSITIVE Sensitive     IMIPENEM <=0.25 SENSITIVE Sensitive     NITROFURANTOIN 32 SENSITIVE Sensitive     TRIMETH/SULFA <=20 SENSITIVE Sensitive     PIP/TAZO <=4 SENSITIVE Sensitive     * >=100,000 COLONIES/mL CITROBACTER KOSERI  MRSA PCR Screening     Status: None   Collection Time: 02/21/15 12:22 PM  Result Value Ref Range Status   MRSA by PCR NEGATIVE NEGATIVE Final    Comment:        The GeneXpert MRSA Assay (FDA approved for NASAL specimens only), is one component of a comprehensive MRSA colonization surveillance program. It is not intended to diagnose MRSA infection nor to guide or monitor treatment for MRSA infections.      Studies: No results found.  Scheduled Meds: . brinzolamide  1 drop Left Eye TID  . enoxaparin (LOVENOX) injection  40 mg Subcutaneous Q24H  . [START ON 03/02/2015] folic acid  1 mg Oral Daily  . insulin aspart  0-15 Units Subcutaneous 6 times per day  . insulin glargine   12 Units Subcutaneous BID  . metoCLOPramide (REGLAN) injection  10 mg Intravenous 4 times per day  . metoprolol tartrate  50 mg Oral BID  . pantoprazole  40 mg Oral BID  . sodium chloride  10-40 mL Intracatheter Q12H  . sodium chloride  3 mL Intravenous Q12H  . sucralfate  1 g Oral TID WC & HS  . [START ON 03/02/2015] thiamine  100 mg Oral Daily    Continuous Infusions:     Time spent: 05/02/2015  MD,  [facility] Pager [phone] If 7PM-7AM, please contact night-coverage at [email or web address], password [email or web address] 03/01/2015, 3:51 PM  LOS: 8 days

## 2015-03-02 DIAGNOSIS — N1831 Chronic kidney disease, stage 3a: Secondary | ICD-10-CM | POA: Insufficient documentation

## 2015-03-02 DIAGNOSIS — N179 Acute kidney failure, unspecified: Secondary | ICD-10-CM | POA: Insufficient documentation

## 2015-03-02 LAB — GLUCOSE, CAPILLARY
Glucose-Capillary: 174 mg/dL — ABNORMAL HIGH (ref 65–99)
Glucose-Capillary: 151 mg/dL — ABNORMAL HIGH (ref 65–99)
Glucose-Capillary: 195 mg/dL — ABNORMAL HIGH (ref 65–99)

## 2015-03-02 MED ORDER — METOCLOPRAMIDE HCL 10 MG PO TABS: 10.0000 mg | ORAL_TABLET | Freq: Three times a day (TID) | ORAL | Status: DC

## 2015-03-02 MED ORDER — OXYCODONE-ACETAMINOPHEN 5-325 MG PO TABS: 1.0000 | ORAL_TABLET | Freq: Four times a day (QID) | ORAL | Status: DC | PRN

## 2015-03-02 NOTE — Progress Notes (Signed)
Per MD order, PICC line removed. Cath intact at 37cm. Vaseline pressure gauze to site, pressure held x . No bleeding to site. Pt instructed to stay in the bed x 30 mins after the PICC line is removed however, she states she will leave immediately after PICC line is removed because she has a ride waiting for her. Pt instructed to keep dressing CDI x 24 hours. Avoid heavy lifting, pushing or pulling x 24 hours,  If bleeding occurs hold pressure, if bleeding does not stop contact MD or go to the ED. Pt does not have any questions. Consuello Masse

## 2015-03-02 NOTE — Plan of Care (Signed)
Problem: Problem: IV to PO Meds Progression Goal: EFFECTIVE RELIEF FROM PO MEDS Outcome: Progressing Patient tolerated po medications without vomiting. Reglan given.

## 2015-03-02 NOTE — Progress Notes (Signed)
Subjective: No nausea and vomiting.  Tolerating diet.  Objective: Vital signs in last 24 hours: Temp:  [98.6 F (37 C)-99.2 F (37.3 C)] 98.6 F (37 C) (08/02 0410) Pulse Rate:  [93-108] 98 (08/02 1028) Resp:  [18] 18 (08/02 0410) BP: (104-126)/(46-76) 114/76 mmHg (08/02 1028) SpO2:  [99 %-100 %] 100 % (08/02 0410) Weight:  [83.4 kg (183 lb 13.8 oz)] 83.4 kg (183 lb 13.8 oz) (08/02 0410) Weight change: -0.107 kg (-3.8 oz) Last BM Date: 03/01/15  PE: GEN:  NAD, pleasant  Lab Results: CBC    Component Value Date/Time   WBC 11.0* 02/27/2015 0500   RBC 4.85 02/27/2015 0500   HGB 13.0 02/27/2015 0500   HCT 39.6 02/27/2015 0500   PLT 262 02/27/2015 0500   MCV 81.6 02/27/2015 0500   MCH 26.8 02/27/2015 0500   MCHC 32.8 02/27/2015 0500   RDW 17.5* 02/27/2015 0500   LYMPHSABS 2.6 07/02/2012 2211   MONOABS 1.1* 07/02/2012 2211   EOSABS 0.1 07/02/2012 2211   BASOSABS 0.0 07/02/2012 2211   CMP     Component Value Date/Time   NA 137 02/27/2015 0503   K 3.3* 02/27/2015 0503   CL 105 02/27/2015 0503   CO2 27 02/27/2015 0503   GLUCOSE 99 02/27/2015 0503   BUN 8 02/27/2015 0503   CREATININE 0.93 02/28/2015 0355   CALCIUM 8.7* 02/27/2015 0503   PROT 7.6 02/26/2015 0450   ALBUMIN 2.9* 02/26/2015 0450   AST 13* 02/26/2015 0450   ALT 6* 02/26/2015 0450   ALKPHOS 123 02/26/2015 0450   BILITOT 1.1 02/26/2015 0450   GFRNONAA >60 02/28/2015 0355   GFRAA >60 02/28/2015 0355   GES:  Significantly delayed gastric emptying at 4 hours.  Esophageal brushing:  No fungal organisms noted; significant inflammatory cells  Assessment:  1. Nausea and vomiting. Resolved. Suspicion for gastroparesis. 2. Severe erosive esophagitis on endoscopy, suspect incited (or at least exacerbated) by underlying gastroparesis.  Plan:  1. Small, frequent meals; gastroparesis diet parameters. 2.  Minimize use of narcotics. 3.  OK to discharge home today; would send home on metoclopramide 10 mg  QAC/QHS and pantoprazole 40 mg po bid. 4.  Patient should follow-up with Dr. Madilyn Fireman, his primary gastroenterologist, at [facility] [phone]). 5.  Will sign-off; please call with questions; case discussed with Dr. Zannie Cove of hospitalist team; thank you for the consultation.   Freddy Jaksch 03/02/2015, 10:44 AM   Pager [phone] If no answer or after 5 PM call [phone]

## 2015-03-02 NOTE — Discharge Summary (Signed)
Physician Discharge Summary  [place] UJW:[id number] DOB: 27-Apr-1966 DOA: 02/21/2015  PCP: Lonia Blood, MD  Admit date: 02/21/2015 Discharge date: 03/02/2015  Time spent: 45 minutes  Recommendations for Outpatient Follow-up:  1. PCP in 1 week 2. Please monitor for side-effects of Reglan  3. Dr.Hayes in 1 month  Discharge Diagnoses:  Principal Problem:   Mild DKA (diabetic ketoacidoses)   Severe esophagitis   Severe Gastroparesis   Uncontrolled type 2 diabetes mellitus with insulin therapy   Anemia   HTN (hypertension)   ESOPHAGEAL STENOSIS   GERD   Rheumatoid arthritis   Nausea vomiting and diarrhea   Diabetic gastroparesis   Tobacco abuse   Hyperlipidemia   History of NSTEMI w/ DES to cfx May 2014   Acute hypokalemia   Severe dehydration   Acute renal failure   Acute UTI   CKD (chronic kidney disease), stage III   Chronic systolic congestive heart failure, NYHA class 1   Acute renal failure syndrome   Discharge Condition: stable  Diet recommendation: diabetic, gastroparesis diet  Filed Weights   02/28/15 0359 03/01/15 0418 03/02/15 0410  Weight: 83.689 kg (184 lb 8 oz) 83.507 kg (184 lb 1.6 oz) 83.4 kg (183 lb 13.8 oz)    History of present illness:   Chief complaint: Nausea, vomiting     HPI This is a 49 year old female patient with known diabetes with associated nephropathy/CKD III and gastroparesis, hypertension, single-vessel coronary artery disease with prior NSTEMI s/p DES May 2014, rheumatoid arthritis, apparent bipolar affective disorder currently not on medications, presented to the ER with acute onset of intractable nausea and vomiting with diarrhea. These symptoms were associated with sharp/ diffuse abdominal pain and began around 9 PM on 7/23.  In the ER patient was afebrile, initially with an increased respiratory rate which has improved to normal, she has remained mildly tachycardic , She was noted to have marked electrolyte abnormalities with sodium  140, potassium 2.8 and chloride 99, BUN normal at 20 but creatinine elevated at 1.72, glucose elevated at 292, lipase 20, anion gap 19, alkaline phosphatase 162, total bilirubin normal. White count was also elevated at 12,800, hemoglobin was elevated for this patient 15.6 with baseline hemoglobin around 12.9. Pulmonary and EKG were negative. Lactic acid was elevated at 2.58. Pregnancy was negative. Urinalysis appeared consistent with a UTI as well as dehydration with a small amount of bilirubin, 100 of glucose, moderate hemoglobin, 40 ketones, moderate leukocytes, greater than 300 protein, 11-20 WBCs. CT of the abdomen and pelvis was unremarkable and unrevealing     Hospital Course:  Mild DKA /type 2 diabetes mellitus with insulin therapy -Suspect precipitated by infectious processes likely combination of suspected UTI with gastroparesis symptoms; treat underlying causes -HgbA1c 7.5 -resolved, off insulin drip -on lantus/SSI, stable,  -discharged back on same regimen of lantus and SSI  Severe esophagitis and suspected gastroparesis  -EGD 7/29, continue PPI BID -Esophogram/Ba swallow with Marked esophageal dilation and mucosal hypertrophy without a definite focal lesion, Marked dysfunction at the GE junction  -Gastric emptying scan with severe gastroparesis, symptomatically better on PPI and Reglan, now tolerating diet and will be discharged home on same to FU with Dr.Hayes in 90month  Severe dehydration with hypokalemia -replaced  Leukocytosis -reactive, afebrile, completed Rx for UTI -CT abd and US-unremarkable -improved  Mild chronic systolic heart failure EF 40-45% in 2014 -repeat Echocardiogram LVEF 55-55%, normal diastolic function, no WMA. -resumed PO Metoprolol and Po lasix   Acute renal failure on CKD, stage III -from  UTI and prerenal due to dehydration -cr on admission 1.72, normalized.   Citrobacter UTI --Completed 5day course of IV rocephin, Stopped 7/29    Rheumatoid arthritis -Not on immunosuppressants/DMARDs prior to admission   History of NSTEMI w/ DES to cfx May 2014 -stable -Continue beta blocker as above   h/o Anemia -Baseline hemoglobin around 12.9 and current hemoglobin 15.6 consistent with hemoconcentration from profound volume depletion -Preadmission iron on hold   Tobacco abuse -Counseled on cessation   Hyperlipidemia -Not on medications prior to admission  Procedures:  EGD    Consultations:  GI  Discharge Exam: Filed Vitals:   03/02/15 1028  BP: 114/76  Pulse: 98  Temp:   Resp:     General:AAOx3 Cardiovascular: S1S2/RRR Respiratory: CTAB  Discharge Instructions   Discharge Instructions    Diet - low sodium heart healthy    Complete by:  As directed      Diet Carb Modified    Complete by:  As directed      Increase activity slowly    Complete by:  As directed           Current Discharge Medication List    START taking these medications   Details  metoCLOPramide (REGLAN) 10 MG tablet Take 1 tablet (10 mg total) by mouth 3 (three) times daily before meals. Qty: 30 tablet, Refills: 0    oxyCODONE-acetaminophen (PERCOCET/ROXICET) 5-325 MG per tablet Take 1 tablet by mouth every 6 (six) hours as needed for moderate pain. Qty: 20 tablet, Refills: 0    promethazine (PHENERGAN) 12.5 MG tablet Take 1 tablet (12.5 mg total) by mouth every 6 (six) hours as needed for nausea or vomiting. Qty: 30 tablet, Refills: 0      CONTINUE these medications which have NOT CHANGED   Details  acetaminophen (TYLENOL) 500 MG tablet Take 500 mg by mouth every 6 (six) hours as needed for moderate pain.     atorvastatin (LIPITOR) 40 MG tablet Take 40 mg by mouth daily.    brinzolamide (AZOPT) 1 % ophthalmic suspension Place 1 drop into the left eye 3 (three) times daily.     furosemide (LASIX) 40 MG tablet TAKE 1 TABLET (40 MG TOTAL) BY MOUTH DAILY. Qty: 30 tablet, Refills: 3    insulin aspart (NOVOLOG) 100  UNIT/ML injection Inject 4-16 Units into the skin 3 (three) times daily with meals. Sliding scale CBG 100-150; 4 units, 151-200; 6 units, 201-250; 8 units, 251-300; 10 units, 301-350; 12 units, 351-400; 14 units, > 401; call doctor    insulin glargine (LANTUS) 100 UNIT/ML injection Inject 16 Units into the skin 2 (two) times daily.     metoprolol (LOPRESSOR) 50 MG tablet Take 50 mg by mouth 2 (two) times daily.    nitroGLYCERIN (NITROSTAT) 0.4 MG SL tablet Place 0.4 mg under the tongue every 5 (five) minutes as needed for chest pain.     ondansetron (ZOFRAN) 4 MG tablet Take 4 mg by mouth every 6 (six) hours as needed for nausea.    pantoprazole (PROTONIX) 40 MG tablet Take 40 mg by mouth 2 (two) times daily.    sertraline (ZOLOFT) 100 MG tablet Take 100 mg by mouth daily.     traMADol (ULTRAM) 50 MG tablet Take 50 mg by mouth every 6 (six) hours as needed for pain.       STOP taking these medications     ferrous sulfate 325 (65 FE) MG tablet        Allergies  Allergen  Reactions  . Aspirin Other (See Comments)    Stomach bleeds  . Codeine Hives and Itching   Follow-up Information    Follow up with GARBA,LAWAL, MD. Schedule an appointment as soon as possible for a visit in 1 week.   Specialty:  Internal Medicine   Contact information:   [address] [place] [address] [phone]       Follow up with HAYES,JOHN C, MD. Schedule an appointment as soon as possible for a visit in 1 month.   Specialty:  Gastroenterology   Contact information:   [address] [place] [address] [phone]        The results of significant diagnostics from this hospitalization (including imaging, microbiology, ancillary and laboratory) are listed below for reference.    Significant Diagnostic Studies: Ct Abdomen Pelvis W Contrast  02/21/2015   CLINICAL DATA:  Nausea, vomiting, diarrhea.  Diffuse abdominal pain.  EXAM: CT ABDOMEN AND PELVIS WITH CONTRAST   TECHNIQUE: Multidetector CT imaging of the abdomen and pelvis was performed using the standard protocol following bolus administration of intravenous contrast.  CONTRAST:  80mL OMNIPAQUE IOHEXOL 300 MG/ML  SOLN  COMPARISON:  02/11/2011  FINDINGS: Lower chest: Presumed cardiac stent partly visualized. Trace dependent pleural fluid and adjacent atelectasis or scarring noted.  Hepatobiliary: Liver and gallbladder are unremarkable.  Pancreas: Atrophic. No mass or pancreatic ductal dilatation visualized.  Spleen: Normal, with splenule is incidentally noted.  Adrenals/Urinary Tract: Lobulated left renal contour is identified compatible with scarring. Nonobstructing bilateral renal calculi noted measuring 2-3 mm. No radiopaque ureteral or bladder calculus. No hydroureteronephrosis.  Stomach/Bowel: No bowel wall thickening or focal segmental dilatation is identified.  Vascular/Lymphatic: Moderate atheromatous aortic calcification without aneurysm. No lymphadenopathy. Small retroperitoneal nodes are identified, largest representative periaortic node measuring 0.6 cm short axis diameter image 33.  Reproductive: Lobulated uterine contour with internal coarse calcifications compatible with fibroids. Ovaries are unremarkable.  Other: The appendix is normal.  No free air or fluid.  Musculoskeletal: No acute osseous abnormality.  IMPRESSION: No acute intra-abdominal or pelvic pathology.   Electronically Signed   By: Christiana Pellant M.D.   On: 02/21/2015 08:32   Dg Esophagus  02/25/2015   CLINICAL DATA:  Esophageal stricture and dilation. Esophageal ulcer in esophagitis patient feels the it food and liquids are stuck in her chest.  EXAM: ESOPHOGRAM/BARIUM SWALLOW  TECHNIQUE: Combined double contrast and single contrast examination performed using effervescent crystals, thick barium liquid, and thin barium liquid.  FLUOROSCOPY TIME:  Radiation Exposure Index (as provided by the fluoroscopic device): 506.65 uGy*m2  COMPARISON:   Esophagram Wm 07/06/2006  FINDINGS: Initial laryngeal images demonstrate no significant laryngeal penetration or aspiration.  The distal esophagus is dilated. There is mucosal irregularity suggesting hypertrophy. A distal esophageal stricture is again noted. This may be functional as there is intermittent opening of the GE junction to a more normal caliber.  Esophageal motility is disorganized with significant stasis of contrast in the distal esophagus.  A 13 mm barium tablet was administered. This passed freely through the GE junction.  Dense coronary artery calcifications are noted.  IMPRESSION: 1. Marked esophageal dilation and mucosal hypertrophy without a definite focal lesion. 2. 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 The GE junction does open intermittently to what appears to be a relatively normal caliber. 3. No definite reflux was present. However, the patient did belch several times during the exam with significant stasis in the distal esophagus.   Electronically  Signed   By: Marin Roberts M.D.   On: 02/25/2015 09:11   US Abdomen Limited  02/25/2015   CLINICAL DATA:  Right upper quadrant abdominal pain since 02/21/2015  EXAM: US ABDOMEN LIMITED - RIGHT UPPER QUADRANT  COMPARISON:  CT abdomen pelvis dated 02/21/2015  FINDINGS: Gallbladder:  No gallstones, gallbladder wall thickening, or pericholecystic fluid. Negative sonographic Murphy's sign.  Common bile duct:  Diameter: 5 mm  Liver:  No focal lesion identified. Within normal limits in parenchymal echogenicity.  IMPRESSION: Negative right upper quadrant ultrasound.   Electronically Signed   By: Charline Bills M.D.   On: 02/25/2015 14:33   Dg Abd Portable 1v  02/22/2015   CLINICAL DATA:  Subsequent encounter for lower abdominal pain with distention and vomiting.  EXAM: PORTABLE ABDOMEN - 1 VIEW  COMPARISON:  CT scan from 02/21/2015.  FINDINGS: Supine portable film at 1630 hours shows no gaseous bowel dilatation  to suggest obstruction. Visualized bony structures are unremarkable. Coarse calcification over the central anatomic pelvis compatible with fibroid disease as seen on the previous CT scan. Phleboliths overlie the inferior anatomic pelvis bilaterally.  IMPRESSION: No evidence for bowel obstruction.   Electronically Signed   By: Kennith Center M.D.   On: 02/22/2015 16:56   Nm Gastric Emptying Solid Liquid Both W/sb Transit  03/01/2015   CLINICAL DATA:  Nausea and vomiting. Initial encounter.  EXAM: NUCLEAR MEDICINE GASTRIC EMPTYING SCAN  TECHNIQUE: After oral ingestion of radiolabeled meal, sequential abdominal images were obtained for 4 hours. Percentage of activity emptying the stomach was calculated at 1 hour, 2 hour, 3 hour, and 4 hours.  RADIOPHARMACEUTICALS:  2.0 mCi Tc-30m MDP labeled sulfur colloid orally  COMPARISON:  None.  FINDINGS: Expected location of the stomach in the left upper quadrant. Ingested meal empties the stomach minimally over the course of the study.  Zero emptied at 1 hr ( normal >= 10%)  2% emptied at 2 hr ( normal >= 40%)  7% emptied at 3 hr ( normal >= 70%)  8% emptied at 4 hr ( normal >= 90%)  IMPRESSION: Severe delayed gastric emptying study.   Electronically Signed   By: Genevive Bi M.D.   On: 03/01/2015 17:37    Microbiology: Recent Results (from the past 240 hour(s))  Urine culture     Status: None   Collection Time: 02/21/15  4:32 AM  Result Value Ref Range Status   Specimen Description URINE, RANDOM  Final   Special Requests NONE  Final   Culture >=100,000 COLONIES/mL CITROBACTER KOSERI  Final   Report Status 02/23/2015 FINAL  Final   Organism ID, Bacteria CITROBACTER KOSERI  Final      Susceptibility   Citrobacter koseri - MIC*    CEFAZOLIN <=4 SENSITIVE Sensitive     CEFTRIAXONE <=1 SENSITIVE Sensitive     CIPROFLOXACIN <=0.25 SENSITIVE Sensitive     GENTAMICIN <=1 SENSITIVE Sensitive     IMIPENEM <=0.25 SENSITIVE Sensitive     NITROFURANTOIN 32 SENSITIVE  Sensitive     TRIMETH/SULFA <=20 SENSITIVE Sensitive     PIP/TAZO <=4 SENSITIVE Sensitive     * >=100,000 COLONIES/mL CITROBACTER KOSERI  MRSA PCR Screening     Status: None   Collection Time: 02/21/15 12:22 PM  Result Value Ref Range Status   MRSA by PCR NEGATIVE NEGATIVE Final    Comment:        The GeneXpert MRSA Assay (FDA approved for NASAL specimens only), is one component of a comprehensive MRSA  colonization surveillance program. It is not intended to diagnose MRSA infection nor to guide or monitor treatment for MRSA infections.      Labs: Basic Metabolic Panel:  Recent Labs Lab 02/24/15 0555 02/25/15 0439 02/26/15 0450 02/27/15 0503 02/28/15 0355  NA 135 139 137 137  --   K 3.6 3.8 3.2* 3.3*  --   CL 101 105 103 105  --   CO2 24 26 25 27   --   GLUCOSE 204* 167* 145* 99  --   BUN 6 9 7 8   --   CREATININE 0.91 0.82 0.84 0.82 0.93  CALCIUM 8.8* 9.2 9.0 8.7*  --   MG 1.5*  --   --   --   --    Liver Function Tests:  Recent Labs Lab 02/24/15 0555 02/25/15 0439 02/26/15 0450  AST 16 15 13*  ALT 8* 8* 6*  ALKPHOS 129* 128* 123  BILITOT 1.0 1.0 1.1  PROT 8.0 7.9 7.6  ALBUMIN 3.2* 3.1* 2.9*   No results for input(s): LIPASE, AMYLASE in the last 168 hours. No results for input(s): AMMONIA in the last 168 hours. CBC:  Recent Labs Lab 02/24/15 0555 02/25/15 0439 02/26/15 0450 02/27/15 0500  WBC 12.2* 19.6* 18.5* 11.0*  HGB 14.2 14.8 14.1 13.0  HCT 43.6 45.2 43.3 39.6  MCV 83.5 83.4 83.1 81.6  PLT 308 335 289 262   Cardiac Enzymes: No results for input(s): CKTOTAL, CKMB, CKMBINDEX, TROPONINI in the last 168 hours. BNP: BNP (last 3 results) No results for input(s): BNP in the last 8760 hours.  ProBNP (last 3 results) No results for input(s): PROBNP in the last 8760 hours.  CBG:  Recent Labs Lab 03/01/15 2028 03/01/15 2042 03/01/15 2336 03/02/15 0405 03/02/15 0803  GLUCAP 211* 211* 123* 174* 151*        Signed:  Garrell Flagg  [facility] 03/02/2015, 10:44 AM

## 2015-03-21 ENCOUNTER — Other Ambulatory Visit: Payer: Self-pay | Admitting: Interventional Cardiology

## 2015-04-20 ENCOUNTER — Other Ambulatory Visit: Payer: Self-pay | Admitting: Interventional Cardiology

## 2015-05-15 ENCOUNTER — Other Ambulatory Visit: Payer: Self-pay | Admitting: Interventional Cardiology

## 2015-05-18 IMAGING — MG MM SCREEN MAMMOGRAM BILATERAL
4 series · 4 of 4 positions shown · non-contrast
Comparison: Previous exam(s).

CLINICAL DATA: Screening.

EXAM:
DIGITAL SCREENING BILATERAL MAMMOGRAM WITH CAD

[R CC]
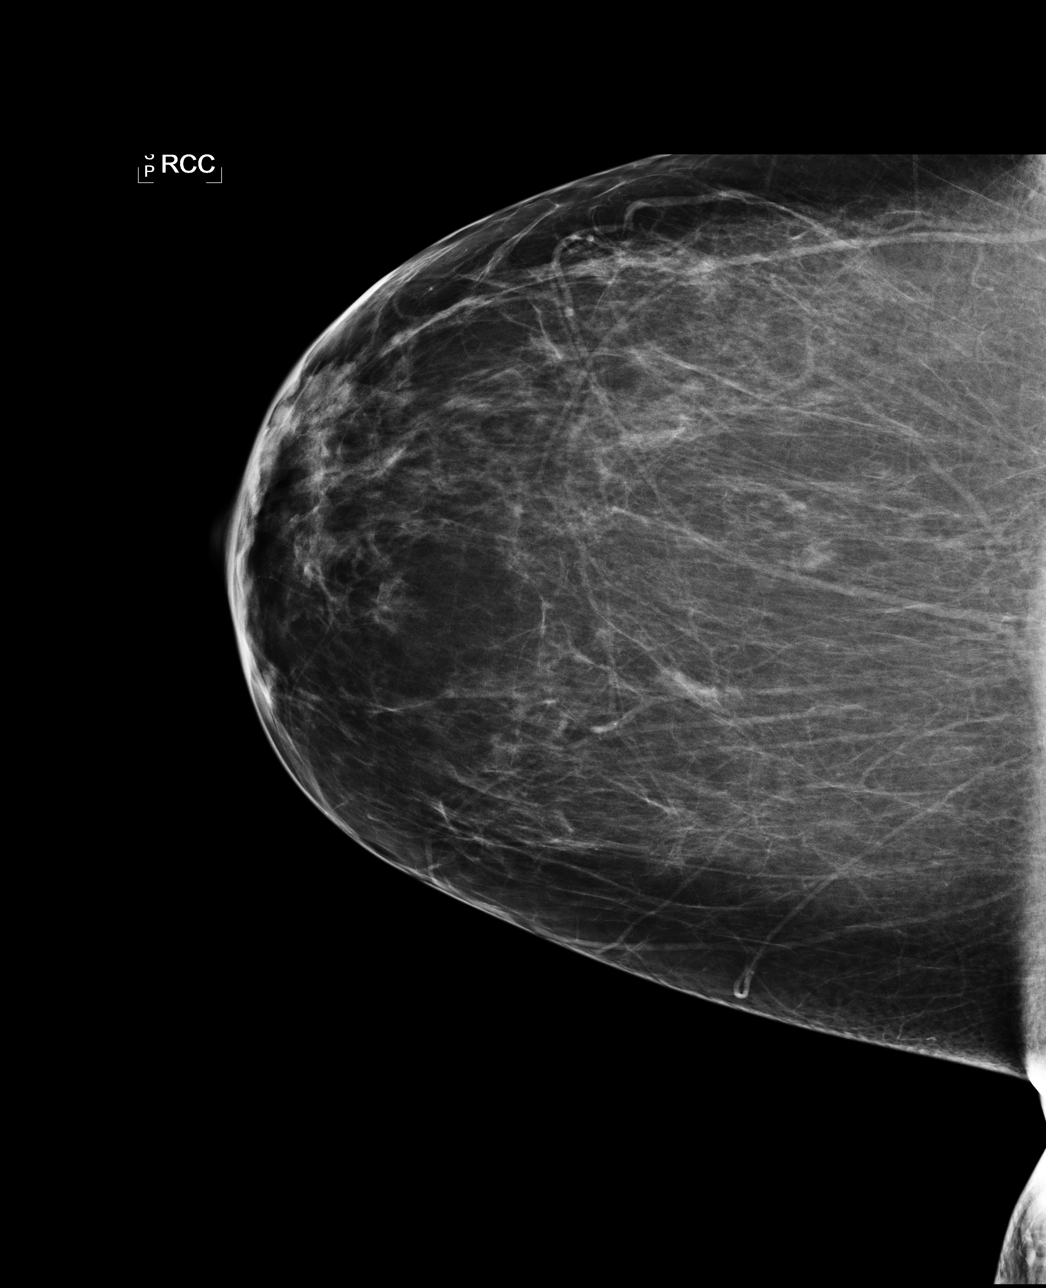

[L CC]
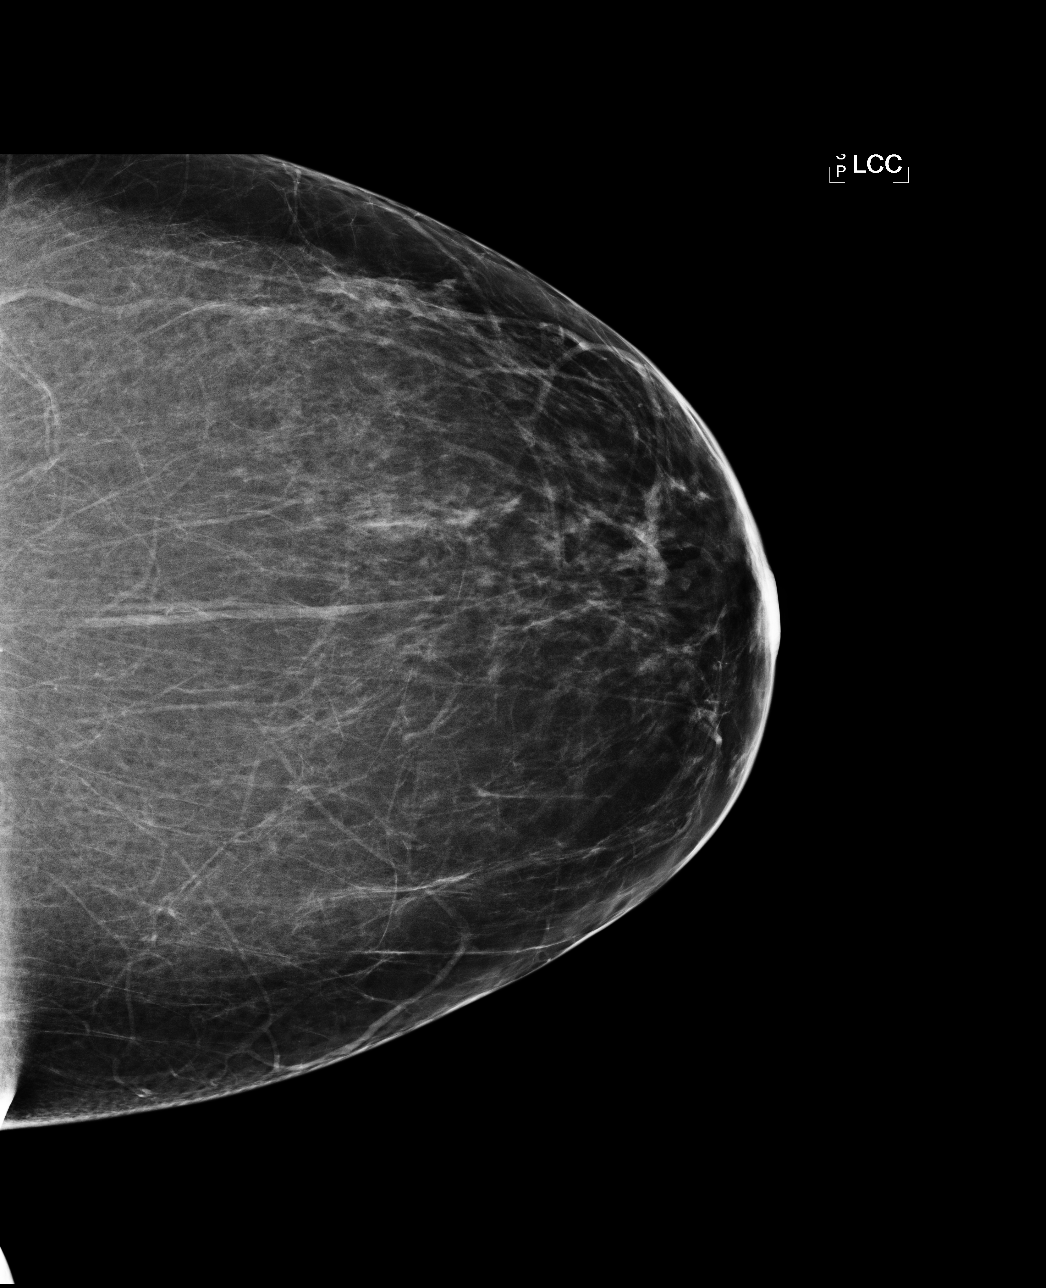

[L MLO]
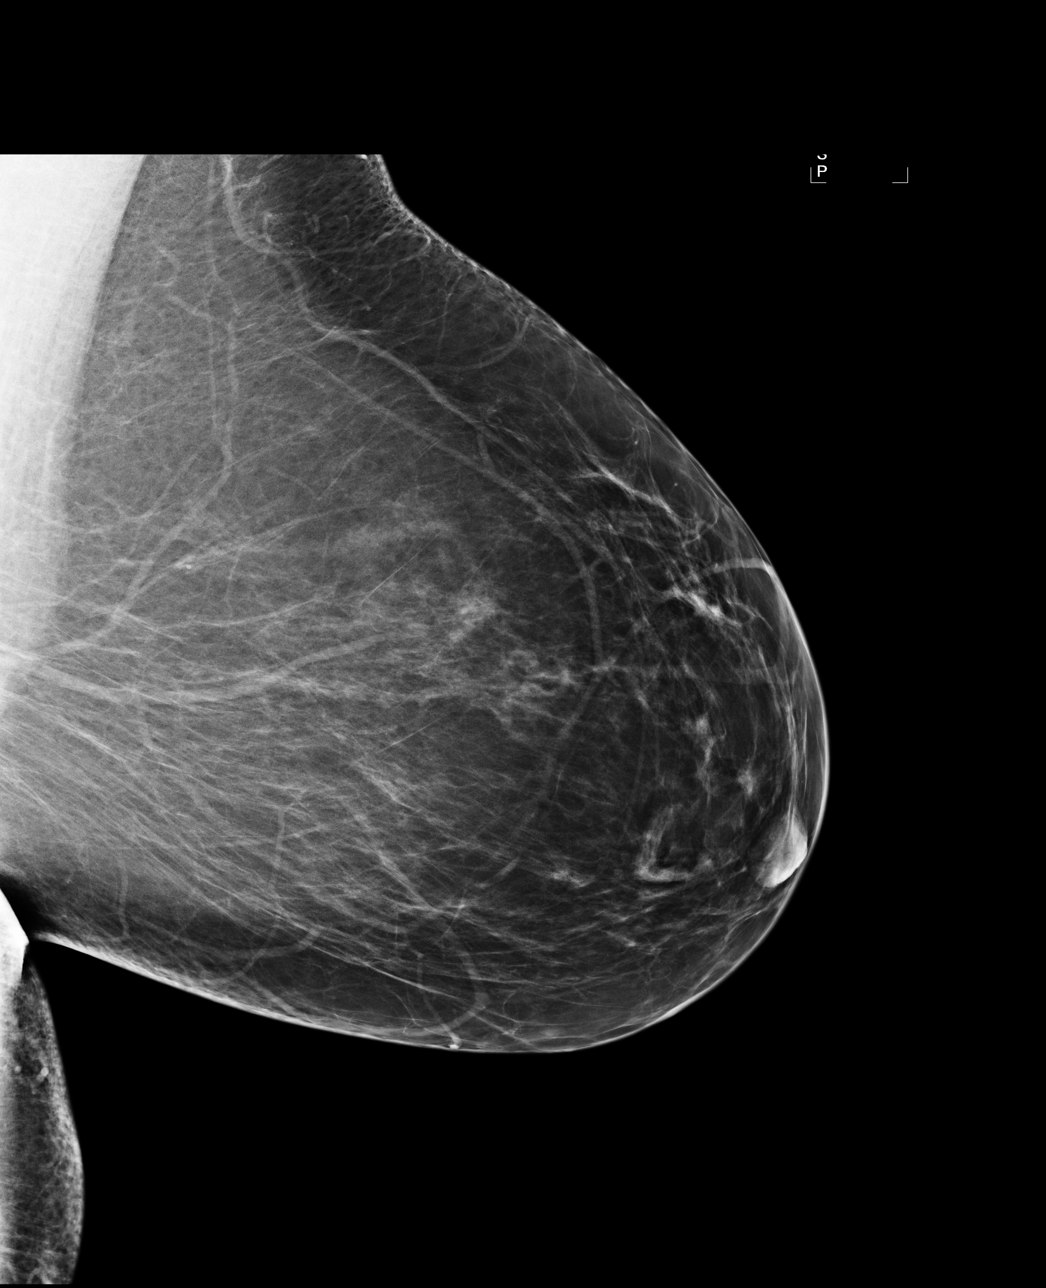

[R MLO]
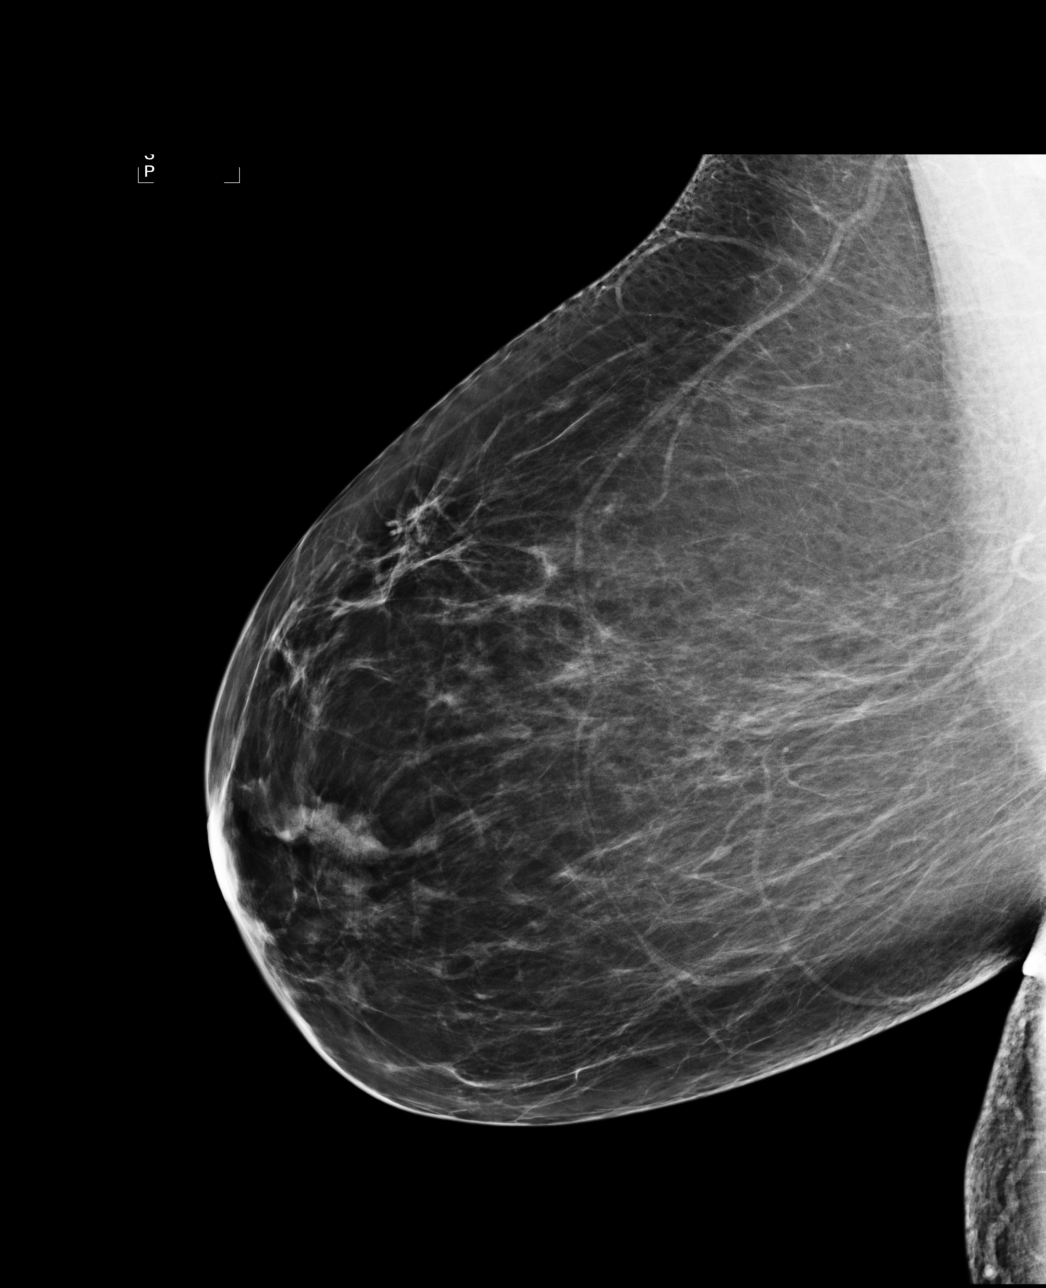

[4 of 4 positions shown; findings below may reference images not displayed]

ACR Breast Density Category b: There are scattered areas of
fibroglandular density.
FINDINGS: There are no findings suspicious for malignancy. Images were
processed with CAD.
IMPRESSION: No mammographic evidence of malignancy. A result letter of this
screening mammogram will be mailed directly to the patient.

RECOMMENDATION:
Screening mammogram in one year. (Code:AS-G-LCT)

BI-RADS CATEGORY  1: Negative.

## 2015-06-13 ENCOUNTER — Other Ambulatory Visit: Payer: Self-pay | Admitting: Interventional Cardiology

## 2015-06-14 ENCOUNTER — Ambulatory Visit: Payer: Self-pay | Admitting: Internal Medicine

## 2015-06-15 NOTE — Telephone Encounter (Signed)
Patient was due for a six month follow up appointment in September. She did not schedule this and has had several notes put on her rx's to call, but she still has failed to do so. Please advise. Thanks, MI

## 2015-06-16 ENCOUNTER — Encounter: Payer: Self-pay | Admitting: Internal Medicine

## 2015-06-16 ENCOUNTER — Ambulatory Visit (INDEPENDENT_AMBULATORY_CARE_PROVIDER_SITE_OTHER): Payer: Medicaid Other | Admitting: Internal Medicine

## 2015-06-16 VITALS — BP 138/88 | HR 84 | Ht 62.0 in | Wt 183.0 lb

## 2015-06-16 DIAGNOSIS — E11319 Type 2 diabetes mellitus with unspecified diabetic retinopathy without macular edema: Secondary | ICD-10-CM

## 2015-06-16 DIAGNOSIS — N760 Acute vaginitis: Secondary | ICD-10-CM | POA: Diagnosis not present

## 2015-06-16 LAB — POCT WET PREP WITH KOH
Clue Cells Wet Prep HPF POC: NEGATIVE
KOH PREP POC: NEGATIVE
RBC Wet Prep HPF POC: NEGATIVE
Trichomonas, UA: NEGATIVE

## 2015-06-16 MED ORDER — FLUCONAZOLE 150 MG PO TABS: ORAL_TABLET | ORAL | Status: DC

## 2015-06-16 NOTE — Patient Instructions (Addendum)
Call Dr. Jeral Pinch and let her know you are unable to get hold of the pessary.  Check to see if there is another brand that is easier to remove. Let me know when you are ready to stop smoking--which should be soon

## 2015-06-16 NOTE — Progress Notes (Signed)
Subjective:    Patient ID: [name] [name], female    DOB: May 24, 1966, 49 y.o.   MRN: [id number]  HPI  1.  Rheumatoid Arthritis:  Ultimately diagnosed in 2011.  On Methotrexate and folic acid.  Takes 10 mg MTX twice weekly on Wednesday and THursday.  Feels her symptoms are fairly well controlled, though still with symptoms in shoulder, hands, knees.  Also on Cyclobenzaprine 5 mg  1-2 tabs mainly at night if hurting.  2.  GERD with stricture/stenosis:   Controlled with Pantoprazole 40 mg daily.  HOB is elevated.  States she watches her diet to avoid triggers.  Follows with Dr. Madilyn Fireman, GI.  Sees him next week.  3.  DM, insulin requiring:  Follows with Dr. Sharl Ma of [place].  Lantus 16 units twice daily.  Novolog sliding scale before meals:  100-150:  5 units and goes up by 5 units (she thinks) for ever 50 glucose level.  Last A1C was 7.1% in maybe July 2016.   Has diabetic retinopathy. Had laser surgery in both eyes, then had bleeding in left eye in 06/2013 and underwent surgery x2 for that with Dr. Clarisa Kindred.  Pt. States she underwent emergency surgery for the left eye in March of 2015 for a retinal detachment, corneal transplant, and cataract removal. Pt. Has no vision out of the left eye now.  Was seen by a Dr. Valere Dross today, who is with [facility] who does not feel she should have more surgery on her left eye.  Using Prednisolone AC 1% in left eye --using 6 times daily according to how her previous eye doctor (Dr. Dionisio Paschal) instructed.   Diabetic Gastroparesis:  Metoclopramide 10 mg 4 times daily. Peripheral Neuropathy of feet--checks feet every night before bed. Pt. Denies diabetic nephropathy, but does have CKD.  4.  CAD:  Follows Dr. Dulcy Fanny, Cardiology.  Metoprolol 25 mg twice daily.  Atorvastatin 80 mg  Daily  5.  Hypertension/CHF NYHA Class I:  Metoprolol as above plus Furosemide 40 mg daily. Tries to watch sodium intake.  6.  Depression:  Has been on Sertraline 100 mg daily\  7.  ?Anemia:  Ferrous  Sulfate 325 mg daily  8.  Urinary stress incontinence;  Follows with Dr. Celene Kras [facility].  Has a pessary from her description, along with estrogen cream  that got stuck 6 days ago, despite her floss pull cord.  Had to have family member pull out.  Developed itching the following day.  No definite discharge or odor.  Itching is actually toward the outside of vaginal area.  Using a Poise pad again--started the day before itching started.   9.  Tobacco Abuse:  Started smoking at age 66 yo.  Has smoked up to a pack per day, but currently at 6 cigarettes daily.  Was on Chantix at one point--"ran her crazy".  Gets rash from the patches.  Was not successful with nicotine gum--smoked while she was chewing the gum.  Not really ready to quit.  Has not had a smoking cessation class.            Review of Systems     Objective:   Physical Exam  Morbidly obese HEENT:  Ptosis with left upper eyelid.  Discoloration of conjunctivae, left eye--appears almost blue Chest:  CTA CV:  RRR, with normal and equal radial pulses Abd:  S, NT, No HSM or masses appreciated.  + BS throughout. Genitalia:  External genitalia with mild inflammation. Has white thick discharge, some of which appears  adherent to vaginal mucosa.  Vaginal mucosa appears mildly inflammed.  No odor.  No CMT.  No adnexal or uterine mass or tenderness.         Assessment & Plan:  1.  Vaginitis:  Difficult to tell if definitively yeast vaginitis as vaginal topical treatment interferes with microscopic view of wet prep/KOH.  Will go ahead and treat with Fluconazole 150 mg by mouth daily for 3 days.  To hold Atorvastatin during those days.   To make sure she is eating appropriately and staying physically active to get sugars under control to avoid glycosuria. Encouraged pt. To discuss the pessary with gyne. Prescription written due to problems with computer system when pt. Here.  2.  Poor outcome with left cornea?  Look into eye surgeon  for another opinion for patient.  Will likely have to be at [facility].

## 2015-06-21 ENCOUNTER — Encounter: Payer: Self-pay | Admitting: Physician Assistant

## 2015-06-21 ENCOUNTER — Ambulatory Visit (INDEPENDENT_AMBULATORY_CARE_PROVIDER_SITE_OTHER): Payer: Medicaid Other | Admitting: Physician Assistant

## 2015-06-21 VITALS — BP 100/64 | HR 64 | Resp 20 | Ht 60.0 in | Wt 185.0 lb

## 2015-06-21 DIAGNOSIS — E785 Hyperlipidemia, unspecified: Secondary | ICD-10-CM

## 2015-06-21 DIAGNOSIS — E669 Obesity, unspecified: Secondary | ICD-10-CM

## 2015-06-21 DIAGNOSIS — I10 Essential (primary) hypertension: Secondary | ICD-10-CM | POA: Diagnosis not present

## 2015-06-21 DIAGNOSIS — Z720 Tobacco use: Secondary | ICD-10-CM | POA: Diagnosis not present

## 2015-06-21 DIAGNOSIS — I5022 Chronic systolic (congestive) heart failure: Secondary | ICD-10-CM | POA: Diagnosis not present

## 2015-06-21 DIAGNOSIS — I2510 Atherosclerotic heart disease of native coronary artery without angina pectoris: Secondary | ICD-10-CM | POA: Diagnosis not present

## 2015-06-21 NOTE — Patient Instructions (Addendum)
Medication Instructions:  Your physician recommends that you continue on your current medications as directed. Please refer to the Current Medication list given to you today.  Lab work: NONE  Testing/Procedures: NONE  Follow-Up: Your physician wants you to follow-up in: 6 months with Dr. Eldridge Dace. You will receive a reminder letter in the mail two months in advance. If you don't receive a letter, please call our office to schedule the follow-up appointment.  Your physician recommends you to get a magnified glass to help with reading.   Please call [phone] about their weight loss program at the [facility].    If you need a refill on your cardiac medications before your next appointment, please call your pharmacy.  Calorie Counting for Weight Loss Calories are energy you get from the things you eat and drink. Your body uses this energy to keep you going throughout the day. The number of calories you eat affects your weight. When you eat more calories than your body needs, your body stores the extra calories as fat. When you eat fewer calories than your body needs, your body burns fat to get the energy it needs. Calorie counting means keeping track of how many calories you eat and drink each day. If you make sure to eat fewer calories than your body needs, you should lose weight. In order for calorie counting to work, you will need to eat the number of calories that are right for you in a day to lose a healthy amount of weight per week. A healthy amount of weight to lose per week is usually 1-2 lb (0.5-0.9 kg). A dietitian can determine how many calories you need in a day and give you suggestions on how to reach your calorie goal.  WHAT IS MY MY PLAN? My goal is to have __________ calories per day.  If I have this many calories per day, I should lose around __________ pounds per week. WHAT DO I NEED TO KNOW ABOUT CALORIE COUNTING? In order to meet your daily calorie goal, you will  need to:  Find out how many calories are in each food you would like to eat. Try to do this before you eat.  Decide how much of the food you can eat.  Write down what you ate and how many calories it had. Doing this is called keeping a food log. WHERE DO I FIND CALORIE INFORMATION? The number of calories in a food can be found on a Nutrition Facts label. Note that all the information on a label is based on a specific serving of the food. If a food does not have a Nutrition Facts label, try to look up the calories online or ask your dietitian for help. HOW DO I DECIDE HOW MUCH TO EAT? To decide how much of the food you can eat, you will need to consider both the number of calories in one serving and the size of one serving. This information can be found on the Nutrition Facts label. If a food does not have a Nutrition Facts label, look up the information online or ask your dietitian for help. Remember that calories are listed per serving. If you choose to have more than one serving of a food, you will have to multiply the calories per serving by the amount of servings you plan to eat. For example, the label on a package of bread might say that a serving size is 1 slice and that there are 90 calories in a serving. If  you eat 1 slice, you will have eaten 90 calories. If you eat 2 slices, you will have eaten 180 calories. HOW DO I KEEP A FOOD LOG? After each meal, record the following information in your food log:  What you ate.  How much of it you ate.  How many calories it had.  Then, add up your calories. Keep your food log near you, such as in a small notebook in your pocket. Another option is to use a mobile app or website. Some programs will calculate calories for you and show you how many calories you have left each time you add an item to the log. WHAT ARE SOME CALORIE COUNTING TIPS?  Use your calories on foods and drinks that will fill you up and not leave you hungry. Some examples of  this include foods like nuts and nut butters, vegetables, lean proteins, and [place]-fiber foods (more than 5 g fiber per serving).  Eat nutritious foods and avoid empty calories. Empty calories are calories you get from foods or beverages that do not have many nutrients, such as candy and soda. It is better to have a nutritious [place]-calorie food (such as an avocado) than a food with few nutrients (such as a bag of chips).  Know how many calories are in the foods you eat most often. This way, you do not have to look up how many calories they have each time you eat them.  Look out for foods that may seem like low-calorie foods but are really [place]-calorie foods, such as baked goods, soda, and fat-free candy.  Pay attention to calories in drinks. Drinks such as sodas, specialty coffee drinks, alcohol, and juices have a lot of calories yet do not fill you up. Choose low-calorie drinks like water and diet drinks.  Focus your calorie counting efforts on higher calorie items. Logging the calories in a garden salad that contains only vegetables is less important than calculating the calories in a milk shake.  Find a way of tracking calories that works for you. Get creative. Most people who are successful find ways to keep track of how much they eat in a day, even if they do not count every calorie. WHAT ARE SOME PORTION CONTROL TIPS?  Know how many calories are in a serving. This will help you know how many servings of a certain food you can have.  Use a measuring cup to measure serving sizes. This is helpful when you start out. With time, you will be able to estimate serving sizes for some foods.  Take some time to put servings of different foods on your favorite plates, bowls, and cups so you know what a serving looks like.  Try not to eat straight from a bag or box. Doing this can lead to overeating. Put the amount you would like to eat in a cup or on a plate to make sure you are eating the right  portion.  Use smaller plates, glasses, and bowls to prevent overeating. This is a quick and easy way to practice portion control. If your plate is smaller, less food can fit on it.  Try not to multitask while eating, such as watching TV or using your computer. If it is time to eat, sit down at a table and enjoy your food. Doing this will help you to start recognizing when you are full. It will also make you more aware of what and how much you are eating. HOW CAN I CALORIE COUNT WHEN EATING  OUT?  Ask for smaller portion sizes or child-sized portions.  Consider sharing an entree and sides instead of getting your own entree.  If you get your own entree, eat only half. Ask for a box at the beginning of your meal and put the rest of your entree in it so you are not tempted to eat it.  Look for the calories on the menu. If calories are listed, choose the lower calorie options.  Choose dishes that include vegetables, fruits, whole grains, low-fat dairy products, and lean protein. Focusing on smart food choices from each of the 5 food groups can help you stay on track at restaurants.  Choose items that are boiled, broiled, grilled, or steamed.  Choose water, milk, unsweetened iced tea, or other drinks without added sugars. If you want an alcoholic beverage, choose a lower calorie option. For example, a regular margarita can have up to 700 calories and a glass of wine has around 150.  Stay away from items that are buttered, battered, fried, or served with cream sauce. Items labeled "crispy" are usually fried, unless stated otherwise.  Ask for dressings, sauces, and syrups on the side. These are usually very [place] in calories, so do not eat much of them.  Watch out for salads. Many people think salads are a healthy option, but this is often not the case. Many salads come with bacon, fried chicken, lots of cheese, fried chips, and dressing. All of these items have a lot of calories. If you want a salad,  choose a garden salad and ask for grilled meats or steak. Ask for the dressing on the side, or ask for olive oil and vinegar or lemon to use as dressing.  Estimate how many servings of a food you are given. For example, a serving of cooked rice is  cup or about the size of half a tennis ball or one cupcake wrapper. Knowing serving sizes will help you be aware of how much food you are eating at restaurants. The list below tells you how big or small some common portion sizes are based on everyday objects.  1 oz--4 stacked dice.  3 oz--1 deck of cards.  1 tsp--1 dice.  1 Tbsp-- a Ping-Pong ball.  2 Tbsp--1 Ping-Pong ball.   cup--1 tennis ball or 1 cupcake wrapper.  1 cup--1 baseball.   This information is not intended to replace advice given to you by your health care provider. Make sure you discuss any questions you have with your health care provider.   Document Released: 07/17/2005 Document Revised: 08/07/2014 Document Reviewed: 05/22/2013 Elsevier Interactive Patient Education 2016 [employer]. You Can Quit Smoking If you are ready to quit smoking or are thinking about it, congratulations! You have chosen to help yourself be healthier and live longer! There are lots of different ways to quit smoking. Nicotine gum, nicotine patches, a nicotine inhaler, or nicotine nasal spray can help with physical craving. Hypnosis, support groups, and medicines help break the habit of smoking. TIPS TO GET OFF AND STAY OFF CIGARETTES  Learn to predict your moods. Do not let a bad situation be your excuse to have a cigarette. Some situations in your life might tempt you to have a cigarette.  Ask friends and co-workers not to smoke around you.  Make your home smoke-free.  Never have "just one" cigarette. It leads to wanting another and another. Remind yourself of your decision to quit.  On a card, make a list of your reasons for not  smoking. Read it at least the same number of times a day as you  have a cigarette. Tell yourself everyday, "I do not want to smoke. I choose not to smoke."  Ask someone at home or work to help you with your plan to quit smoking.  Have something planned after you eat or have a cup of coffee. Take a walk or get other exercise to perk you up. This will help to keep you from overeating.  Try a relaxation exercise to calm you down and decrease your stress. Remember, you may be tense and nervous the first two weeks after you quit. This will pass.  Find new activities to keep your hands busy. Play with a pen, coin, or rubber band. Doodle or draw things on paper.  Brush your teeth right after eating. This will help cut down the craving for the taste of tobacco after meals. You can try mouthwash too.  Try gum, breath mints, or diet candy to keep something in your mouth. IF YOU SMOKE AND WANT TO QUIT:  Do not stock up on cigarettes. Never buy a carton. Wait until one pack is finished before you buy another.  Never carry cigarettes with you at work or at home.  Keep cigarettes as far away from you as possible. Leave them with someone else.  Never carry matches or a lighter with you.  Ask yourself, "Do I need this cigarette or is this just a reflex?"  Bet with someone that you can quit. Put cigarette money in a piggy bank every morning. If you smoke, you give up the money. If you do not smoke, by the end of the week, you keep the money.  Keep trying. It takes 21 days to change a habit!  Talk to your doctor about using medicines to help you quit. These include nicotine replacement gum, lozenges, or skin patches.   This information is not intended to replace advice given to you by your health care provider. Make sure you discuss any questions you have with your health care provider.   Document Released: 05/13/2009 Document Revised: 10/09/2011 Document Reviewed: 05/13/2009 Elsevier Interactive Patient Education 2016 [employer]. Steps to Quit Smoking   Smoking tobacco can be harmful to your health and can affect almost every organ in your body. Smoking puts you, and those around you, at risk for developing many serious chronic diseases. Quitting smoking is difficult, but it is one of the best things that you can do for your health. It is never too late to quit. WHAT ARE THE BENEFITS OF QUITTING SMOKING? When you quit smoking, you lower your risk of developing serious diseases and conditions, such as:  Lung cancer or lung disease, such as COPD.  Heart disease.  Stroke.  Heart attack.  Infertility.  Osteoporosis and bone fractures. Additionally, symptoms such as coughing, wheezing, and shortness of breath may get better when you quit. You may also find that you get sick less often because your body is stronger at fighting off colds and infections. If you are pregnant, quitting smoking can help to reduce your chances of having a baby of low birth weight. HOW DO I GET READY TO QUIT? When you decide to quit smoking, create a plan to make sure that you are successful. Before you quit:  Pick a date to quit. Set a date within the next two weeks to give you time to prepare.  Write down the reasons why you are quitting. Keep this list in places where  you will see it often, such as on your bathroom mirror or in your car or wallet.  Identify the people, places, things, and activities that make you want to smoke (triggers) and avoid them. Make sure to take these actions:  Throw away all cigarettes at home, at work, and in your car.  Throw away smoking accessories, such as Set designer.  Clean your car and make sure to empty the ashtray.  Clean your home, including curtains and carpets.  Tell your family, friends, and coworkers that you are quitting. Support from your loved ones can make quitting easier.  Talk with your health care provider about your options for quitting smoking.  Find out what treatment options are covered by  your health insurance. WHAT STRATEGIES CAN I USE TO QUIT SMOKING?  Talk with your healthcare provider about different strategies to quit smoking. Some strategies include:  Quitting smoking altogether instead of gradually lessening how much you smoke over a period of time. Research shows that quitting "cold [place]" is more successful than gradually quitting.  Attending in-person counseling to help you build problem-solving skills. You are more likely to have success in quitting if you attend several counseling sessions. Even short sessions of 10 minutes can be effective.  Finding resources and support systems that can help you to quit smoking and remain smoke-free after you quit. These resources are most helpful when you use them often. They can include:  Online chats with a Veterinary surgeon.  Telephone quitlines.  Printed Materials engineer.  Support groups or group counseling.  Text messaging programs.  Mobile phone applications.  Taking medicines to help you quit smoking. (If you are pregnant or breastfeeding, talk with your health care provider first.) Some medicines contain nicotine and some do not. Both types of medicines help with cravings, but the medicines that include nicotine help to relieve withdrawal symptoms. Your health care provider may recommend:  Nicotine patches, gum, or lozenges.  Nicotine inhalers or sprays.  Non-nicotine medicine that is taken by mouth. Talk with your health care provider about combining strategies, such as taking medicines while you are also receiving in-person counseling. Using these two strategies together makes you more likely to succeed in quitting than if you used either strategy on its own. If you are pregnant or breastfeeding, talk with your health care provider about finding counseling or other support strategies to quit smoking. Do not take medicine to help you quit smoking unless told to do so by your health care provider. WHAT THINGS CAN I DO  TO MAKE IT EASIER TO QUIT? Quitting smoking might feel overwhelming at first, but there is a lot that you can do to make it easier. Take these important actions:  Reach out to your family and friends and ask that they support and encourage you during this time. Call telephone quitlines, reach out to support groups, or work with a counselor for support.  Ask people who smoke to avoid smoking around you.  Avoid places that trigger you to smoke, such as bars, parties, or smoke-break areas at work.  Spend time around people who do not smoke.  Lessen stress in your life, because stress can be a smoking trigger for some people. To lessen stress, try:  Exercising regularly.  Deep-breathing exercises.  Yoga.  Meditating.  Performing a body scan. This involves closing your eyes, scanning your body from head to toe, and noticing which parts of your body are particularly tense. Purposefully relax the muscles in those areas.  Download or purchase mobile phone or tablet apps (applications) that can help you stick to your quit plan by providing reminders, tips, and encouragement. There are many free apps, such as QuitGuide from the [employer] developer for Disease Control and Prevention). You can find other support for quitting smoking (smoking cessation) through smokefree.gov and other websites. HOW WILL I FEEL WHEN I QUIT SMOKING? Within the first 24 hours of quitting smoking, you may start to feel some withdrawal symptoms. These symptoms are usually most noticeable 2-3 days after quitting, but they usually do not last beyond 2-3 weeks. Changes or symptoms that you might experience include:  Mood swings.  Restlessness, anxiety, or irritation.  Difficulty concentrating.  Dizziness.  Strong cravings for sugary foods in addition to nicotine.  Mild weight gain.  Constipation.  Nausea.  Coughing or a sore throat.  Changes in how your medicines work in your body.  A depressed mood.  Difficulty  sleeping (insomnia). After the first 2-3 weeks of quitting, you may start to notice more positive results, such as:  Improved sense of smell and taste.  Decreased coughing and sore throat.  Slower heart rate.  Lower blood pressure.  Clearer skin.  The ability to breathe more easily.  Fewer sick days. Quitting smoking is very challenging for most people. Do not get discouraged if you are not successful the first time. Some people need to make many attempts to quit before they achieve long-term success. Do your best to stick to your quit plan, and talk with your health care provider if you have any questions or concerns.   This information is not intended to replace advice given to you by your health care provider. Make sure you discuss any questions you have with your health care provider.   Document Released: 07/11/2001 Document Revised: 12/01/2014 Document Reviewed: 12/01/2014 Elsevier Interactive Patient Education [employer]

## 2015-06-21 NOTE — Assessment & Plan Note (Signed)
Patient has lost 13 pounds but is having a lot of pain in her back and shoulders and chest from excessive weight. She may need a breast reduction. In the meantime recommend weight loss program at [facility] or [facility]. Have given her information on both.

## 2015-06-21 NOTE — Assessment & Plan Note (Signed)
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 She is allergic to aspirin and Plavix was stopped because of bleeding in her eye. Continue Lipitor, metoprolol. Follow-up with Dr.Varanasi in 6 months.

## 2015-06-21 NOTE — Assessment & Plan Note (Signed)
Smoking cessation is necessary. I had along discussion with the patient concerning the risk especially her being diabetic. She said she is trying. She can't wear the patch because it causes a rash and she doesn't like the gum. She also had a reaction to Chantix.

## 2015-06-21 NOTE — Assessment & Plan Note (Signed)
Heart failure compensated. 2-D echo 02/22/15 EF 50-55% with normal diastolic function

## 2015-06-21 NOTE — Progress Notes (Signed)
Cardiology Office Note   Date:  06/21/2015   ID:  [name] [name], DOB [birth year], MRN [id number]  PCP:  Julieanne Manson, MD  Cardiologist:  Dr. Eldridge Dace Chief Complaint: Chest and back pain from heavy breasts    History of Present Illness: [name] [name] is a 49 y.o. female who presents for six-month follow-up. She has a history of a inferior MI several years ago. She had a NSTEMI 11/2012 and with DES of the circumflex. She was last seen 09/2014 at which time she was having a lot of visual problems with bleeding in her eye and was looking at a corneal transplant. She is not on aspirin because she is allergic. Plavix was stopped in 2015. 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  She had a hospitalization in July 2016 with mild DKA precipitated by infectious process likely a combination of UTI and gastroparesis. She was found to have severe esophagitis and suspected gastroparesis on EGD 02/26/15. 2-D echo 02/22/15 EF 50-55% with normal diastolic function and no wall motion abnormality.  Patient complains of back and chest pain from heavy breasts. She can bring on the pain by moving her shoulders forward. Also decreased smoking because it causes a sharp shooting pain, but still smoking 5-6 cigarettes daily.. She's lost 13 lbs since last visit. She walks 1/2 mile daily without chest pain, but does make her diet. She would like to continue to lose weight and is still thinking about breast reduction. She is also supposed to go to [facility] for further evaluation of her left eye.    Past Medical History  Diagnosis Date  . Hypertension   . Sebaceous cyst   . Anemia   . Allergic rhinitis   . Hypertension   . Diabetic retinopathy   . Acute osteomyelitis   . Esophageal ulcer   . Esophageal stenosis   . Gastroparesis   . Renal insufficiency   . GERD (gastroesophageal reflux disease)   . Hyperlipidemia   . Depression   .  Anxiety   . Polysubstance abuse   . Esophagitis   . CAD (coronary artery disease) 12/12    s/p DES mid and distal RCA with 50% LAD  . Heart murmur   . Pneumonia 2012  . Orthopnoea   . Type II diabetes mellitus (HCC)   . 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    [name] on 12/19/2012, "that's the only one I've had" (12/19/2012)  . Acute myocardial infarction, unspecified site, initial episode of care   . Acute myocardial infarction of other lateral wall, initial episode of care   . Rheumatoid arthritis(714.0)   . Bipolar affective (HCC)   . History of stomach ulcers   . Anginal pain (HCC)     07/15/13- no chest pain in months"  . Stroke [facility]) 2011    denies residual on 12/19/2012.  "Years ago"  . Daily headache     not daily  . Migraines     Past Surgical History  Procedure Laterality Date  . Irrigation and debridement sebaceous cyst Right 03/2011    "pointer" (12/19/2012)  . Coronary angioplasty with stent placement  01/20/2009    "2" (12/19/2012)  . Coronary angioplasty with stent placement  2012    "2" (12/19/2012)  . Coronary angioplasty with stent placement  12/19/2012    "2" (12/19/2012)  . Pars plana vitrectomy Left 07/16/2013    Procedure: PARS PLANA VITRECTOMY WITH 23 GAUGE;  Surgeon: Shade Flood, MD;  Location:  [facility] OR;  Service: Ophthalmology;  Laterality: Left;  Marland Kitchen Membrane peel Left 07/16/2013    Procedure: MEMBRANE PEEL;  Surgeon: Shade Flood, MD;  Location: [facility] OR;  Service: Ophthalmology;  Laterality: Left;  . Photocoagulation with laser Left 07/16/2013    Procedure: PHOTOCOAGULATION WITH LASER;  Surgeon: Shade Flood, MD;  Location: [facility] OR;  Service: Ophthalmology;  Laterality: Left;  ENDOLASER  . Gas insertion Left 07/16/2013    Procedure: INSERTION OF GAS;  Surgeon: Shade Flood, MD;  Location: [facility] OR;  Service: Ophthalmology;  Laterality: Left;  SF6  . Pars plana vitrectomy Left 07/30/2013    Procedure: PARS PLANA VITRECTOMY WITH 23 GAUGE WITH ENDOLASER;  Surgeon: Shade Flood,  MD;  Location: [facility] OR;  Service: Ophthalmology;  Laterality: Left;  with endolaser  . Gas/fluid exchange Left 07/30/2013    Procedure: GAS/FLUID EXCHANGE;  Surgeon: Shade Flood, MD;  Location: [facility] OR;  Service: Ophthalmology;  Laterality: Left;  . Left heart catheterization with coronary angiogram N/A 07/06/2011    Procedure: LEFT HEART CATHETERIZATION WITH CORONARY ANGIOGRAM;  Surgeon: Corky Crafts, MD;  Location: [facility] CATH LAB;  Service: Cardiovascular;  Laterality: N/A;  possible PCI  . Percutaneous coronary stent intervention (pci-s) N/A 07/06/2011    Procedure: PERCUTANEOUS CORONARY STENT INTERVENTION (PCI-S);  Surgeon: Corky Crafts, MD;  Location: [facility] CATH LAB;  Service: Cardiovascular;  Laterality: N/A;  . Left heart catheterization with coronary angiogram N/A 12/19/2012    Procedure: LEFT HEART CATHETERIZATION WITH CORONARY ANGIOGRAM;  Surgeon: Corky Crafts, MD;  Location: [facility] CATH LAB;  Service: Cardiovascular;  Laterality: N/A;  . Esophagogastroduodenoscopy N/A 02/26/2015    Procedure: ESOPHAGOGASTRODUODENOSCOPY (EGD);  Surgeon: Dorena Cookey, MD;  Location: [facility] ENDOSCOPY;  Service: Endoscopy;  Laterality: N/A;     Current Outpatient Prescriptions  Medication Sig Dispense Refill  . acetaminophen (TYLENOL) 500 MG tablet Take 500 mg by mouth every 6 (six) hours as needed for moderate pain.     Marland Kitchen atorvastatin (LIPITOR) 40 MG tablet Take 40 mg by mouth daily. Take two tablets daily    . cyclobenzaprine (FLEXERIL) 10 MG tablet Take 10 mg by mouth at bedtime.  3  . ferrous sulfate 325 (65 FE) MG tablet Take 325 mg by mouth daily with breakfast.    . folic acid (FOLVITE) 1 MG tablet Take 1 mg by mouth daily.    . furosemide (LASIX) 40 MG tablet TAKE 1 TABLET BY MOUTH EVERY DAY 14 tablet 0  . insulin aspart (NOVOLOG) 100 UNIT/ML injection Inject 4-16 Units into the skin 3 (three) times daily with meals. Sliding scale CBG 100-150; 4 units, 151-200; 6 units, 201-250; 8 units, 251-300;  10 units, 301-350; 12 units, 351-400; 14 units, > 401; call doctor    . insulin glargine (LANTUS) 100 UNIT/ML injection Inject 16 Units into the skin 2 (two) times daily.     . methotrexate (RHEUMATREX) 2.5 MG tablet Take 10 mg by mouth 2 (two) times a week. Caution:Chemotherapy. Protect from light.    . metoCLOPramide (REGLAN) 10 MG tablet Take 1 tablet (10 mg total) by mouth 4 (four) times daily -  before meals and at bedtime. 30 tablet 0  . metoprolol tartrate (LOPRESSOR) 25 MG tablet Take 25 mg by mouth 2 (two) times daily.  2  . nitroGLYCERIN (NITROSTAT) 0.4 MG SL tablet Place 0.4 mg under the tongue every 5 (five) minutes as needed for chest pain.     Marland Kitchen ondansetron (ZOFRAN) 4 MG tablet Take 4 mg by mouth  every 6 (six) hours as needed for nausea.    . pantoprazole (PROTONIX) 40 MG tablet Take 40 mg by mouth 2 (two) times daily.    . prednisoLONE acetate (PRED FORTE) 1 % ophthalmic suspension 1 drop 6 (six) times daily.    . promethazine (PHENERGAN) 12.5 MG tablet Take 1 tablet (12.5 mg total) by mouth every 6 (six) hours as needed for nausea or vomiting. 30 tablet 0  . sertraline (ZOLOFT) 100 MG tablet Take 100 mg by mouth daily.     . traMADol (ULTRAM) 50 MG tablet Take 50 mg by mouth every 6 (six) hours as needed for pain.      No current facility-administered medications for this visit.    Allergies:   Aspirin    Social History:  The patient  reports that she has been smoking Cigarettes.  She has a 11 pack-year smoking history. She has never used smokeless tobacco. She reports that she drinks alcohol. She reports that she does not use illicit drugs.   Family History:  The patient's    family history includes Breast cancer (age of onset: 4) in her mother; Hypertension in her father and sister; Stomach cancer in her sister. There is no history of Colon cancer.    ROS:  Please see the history of present illness.   Otherwise, review of systems are positive for blind in her left eye,  can't see well in her right eye, chronic back pain joint pain balance issues, easy bruising.   All other systems are reviewed and negative.    PHYSICAL EXAM: VS:  BP 100/64 mmHg  Pulse 64  Resp 20  Ht 5' (1.524 m)  Wt 185 lb (83.915 kg)  BMI 36.13 kg/m2  SpO2 95% , BMI Body mass index is 36.13 kg/(m^2). GEN: Obese, in no acute distress Neck: no JVD, HJR, carotid bruits, or masses Cardiac:RRR, positive S4; no murmurs, rubs, thrill or heave,  Respiratory:  clear to auscultation bilaterally, normal work of breathing GI: soft, nontender, nondistended, + BS MS: no deformity or atrophy Extremities: without cyanosis, clubbing, edema, good distal pulses bilaterally.  Skin: warm and dry, no rash Neuro:  Strength and sensation are intact    EKG:  EKG is not ordered today.    Recent Labs: 02/24/2015: Magnesium 1.5* 02/26/2015: ALT 6* 02/27/2015: BUN 8; Hemoglobin 13.0; Platelets 262; Potassium 3.3*; Sodium 137 02/28/2015: Creatinine, Ser 0.93    Lipid Panel    Component Value Date/Time   CHOL 186 10/14/2014 0835   TRIG 71.0 10/14/2014 0835   HDL 72.80 10/14/2014 0835   CHOLHDL 3 10/14/2014 0835   VLDL 14.2 10/14/2014 0835   LDLCALC 99 10/14/2014 0835      Wt Readings from Last 3 Encounters:  06/21/15 185 lb (83.915 kg)  06/16/15 183 lb (83.008 kg)  03/02/15 183 lb 13.8 oz (83.4 kg)      Other studies Reviewed: Additional studies/ records that were reviewed today include and review of the records demonstrates:  2-D echo 02/22/15 Study Conclusions  - Left ventricle: The cavity size was normal. Wall thickness was   normal. Systolic function was normal. The estimated ejection   fraction was in the range of 50% to 55%. Wall motion was normal;   there were no regional wall motion abnormalities. Left   ventricular diastolic function parameters were normal. - Atrial septum: No defect or patent foramen ovale was identified.  IMPRESSIONS:    1. Normal left main coronary  artery. 2. No significant disease  in the left anterior descending artery and its branches. 3. 90% lesion in the mid left circumflex artery.  This appears to be a ruptured plaque and was successfully stented with a 2.75 x 23 drug-eluting stent, postdilated to 3.3 mm in diameter. 4. Occluded mid right coronary artery, which appears chronic.  The entire distal stented segment appears occluded.  The distal RCA system has faint collaterals from the left system. 5. Mildly decreased left ventricular systolic function.  LVEDP 30 mmHg.  Ejection fraction 40-45 %.  RECOMMENDATION:  She needs to remain on Plavix.  Her LVEDP is increased.  We will diuresis her.  She will need medical therapy for decreased LV function.  She'll be watched overnight.       ASSESSMENT AND PLAN: Coronary atherosclerosis Patient is stable from a cardiac standpoint since her last MI and stenting in 2014. She is allergic to aspirin and Plavix was stopped because of bleeding in her eye. Continue Lipitor, metoprolol. Follow-up with Dr.Varanasi in 6 months.  HTN (hypertension) Blood pressure well controlled.  Chronic systolic congestive heart failure, NYHA class 1 Heart failure compensated. 2-D echo 02/22/15 EF 50-55% with normal diastolic function  Tobacco abuse Smoking cessation is necessary. I had along discussion with the patient concerning the risk especially her being diabetic. She said she is trying. She can't wear the patch because it causes a rash and she doesn't like the gum. She also had a reaction to Chantix.  Hyperlipidemia Continue Lipitor.  Obesity (BMI 30-39.9) Patient has lost 13 pounds but is having a lot of pain in her back and shoulders and chest from excessive weight. She may need a breast reduction. In the meantime recommend weight loss program at [facility] or [facility]. Have given her information on both.     Elson Clan, PA-C  06/21/2015 10:39 AM    [facility]  [facility] [address] [place], [place], [place]  [address] Phone: [phone]; Fax: [phone]

## 2015-06-21 NOTE — Assessment & Plan Note (Signed)
Blood pressure well controlled

## 2015-06-21 NOTE — Assessment & Plan Note (Signed)
-  Continue Lipitor °

## 2015-06-29 ENCOUNTER — Telehealth: Payer: Self-pay | Admitting: Internal Medicine

## 2015-06-29 NOTE — Telephone Encounter (Addendum)
Refill of Rx - as follows: traMADOL 50 mg. Tab; sertraline(Zoloft) 100 mg; ferrous sulfate 325 (65FE) mg . Tab., metoprolof tartrate (Lopressor) 25 mg. Tab; methotrexate (RHEUMATREX) 2,.5 mg. Tablet.  Patient would like to speak with Dr. About her Medicad - if Dr. Delrae Alfred was able to get things cleared/approved.  Her Medicaid has [place] [employer]. Please call patient at [phone].  Patient would like Rx called into [facility] [address].

## 2015-06-30 MED ORDER — FERROUS SULFATE 325 (65 FE) MG PO TABS: 325.0000 mg | ORAL_TABLET | Freq: Every day | ORAL | Status: DC

## 2015-06-30 MED ORDER — METOPROLOL TARTRATE 25 MG PO TABS: 25.0000 mg | ORAL_TABLET | Freq: Two times a day (BID) | ORAL | Status: DC

## 2015-06-30 MED ORDER — FLUCONAZOLE 150 MG PO TABS: ORAL_TABLET | ORAL | Status: DC

## 2015-06-30 MED ORDER — SERTRALINE HCL 100 MG PO TABS: 100.0000 mg | ORAL_TABLET | Freq: Every day | ORAL | Status: DC

## 2015-06-30 MED ORDER — TRAMADOL HCL 50 MG PO TABS: 50.0000 mg | ORAL_TABLET | Freq: Four times a day (QID) | ORAL | Status: DC | PRN

## 2015-06-30 NOTE — Telephone Encounter (Signed)
Called pharmacy:  [facility]:  Pt. Has 2 refills of Sertraline left--added 9 refills.  Tramadol has been filled for 120 tabs monthly--must wait 30 days before refill and refilled 5 times.  Ferrous sulfate filled for 1 year, as was Metoprolol--all verbally. Will document in meds and notify patient

## 2015-07-01 ENCOUNTER — Other Ambulatory Visit: Payer: Self-pay | Admitting: Internal Medicine

## 2015-07-01 DIAGNOSIS — E1139 Type 2 diabetes mellitus with other diabetic ophthalmic complication: Secondary | ICD-10-CM

## 2015-07-27 ENCOUNTER — Telehealth: Payer: Self-pay | Admitting: Internal Medicine

## 2015-07-27 NOTE — Telephone Encounter (Signed)
Authorization for 6 visits given

## 2015-07-29 ENCOUNTER — Other Ambulatory Visit: Payer: Self-pay | Admitting: Internal Medicine

## 2015-07-29 MED ORDER — PANTOPRAZOLE SODIUM 40 MG PO TBEC: 40.0000 mg | DELAYED_RELEASE_TABLET | Freq: Two times a day (BID) | ORAL | Status: DC

## 2015-07-29 MED ORDER — FERROUS SULFATE 325 (65 FE) MG PO TABS: 325.0000 mg | ORAL_TABLET | Freq: Every day | ORAL | Status: DC

## 2015-07-29 MED ORDER — ATORVASTATIN CALCIUM 40 MG PO TABS: 40.0000 mg | ORAL_TABLET | Freq: Every day | ORAL | Status: DC

## 2015-07-29 MED ORDER — SERTRALINE HCL 100 MG PO TABS: 100.0000 mg | ORAL_TABLET | Freq: Every day | ORAL | Status: DC

## 2015-07-29 MED ORDER — METOPROLOL TARTRATE 25 MG PO TABS: 25.0000 mg | ORAL_TABLET | Freq: Two times a day (BID) | ORAL | Status: DC

## 2015-07-29 MED ORDER — FUROSEMIDE 40 MG PO TABS: 40.0000 mg | ORAL_TABLET | Freq: Every day | ORAL | Status: DC

## 2015-07-29 MED ORDER — TRAMADOL HCL 50 MG PO TABS: 50.0000 mg | ORAL_TABLET | Freq: Four times a day (QID) | ORAL | Status: DC | PRN

## 2015-07-29 MED ORDER — METOCLOPRAMIDE HCL 10 MG PO TABS: 5.0000 mg | ORAL_TABLET | Freq: Three times a day (TID) | ORAL | Status: DC

## 2015-07-29 MED ORDER — ATORVASTATIN CALCIUM 40 MG PO TABS: ORAL_TABLET | ORAL | Status: DC

## 2015-07-29 NOTE — Progress Notes (Signed)
Fax from [facility] to eprescribe Ms.  Nierman's medications.  They will contact Dr. Sharl Ma for diabetic meds and Dr. Kellie Simmering for Rheum meds.  They are asking if long term use of Metoclopramide can be decreased or discontinued for trial period.  I will decrease to 5 mg and see how Ms. Sergent does. Will send message that it is not clear to me she is to continue on the prednisolone eye drops and we are getting a second opinion at [facility]. [facility] [phone] after speaking with [facility] to see how long it will take to get her Tramadol.  We recently [facility] a [facility] with multiple refills that will need to be cancelled, but if there is a delay in when she gets meds from [facility], want to leave at least one refill at [facility] for her.

## 2015-07-29 NOTE — Progress Notes (Signed)
Phone call with [facility] followed by call to patient. Pt. Is actually taking 80 mg of Atorvastatin, but written in an odd way, so will rewrite and resend to [facility]. Pt. Is fine with decreasing Metoclopramide to 5 mg 4 times daily and seeing if she is okay on that dose.

## 2015-07-30 ENCOUNTER — Telehealth: Payer: Self-pay | Admitting: Internal Medicine

## 2015-07-30 NOTE — Telephone Encounter (Signed)
Message sent to Dr. Delrae Alfred is this okay for patient to have [facility] if so please forward medications

## 2015-07-30 NOTE — Telephone Encounter (Signed)
Patient left a voicemail states she rather have [facility] instead of the other [facility] .

## 2015-08-03 MED ORDER — METOCLOPRAMIDE HCL 10 MG PO TABS: 5.0000 mg | ORAL_TABLET | Freq: Three times a day (TID) | ORAL | Status: DC

## 2015-08-03 MED ORDER — ATORVASTATIN CALCIUM 40 MG PO TABS: ORAL_TABLET | ORAL | Status: DC

## 2015-08-03 MED ORDER — FERROUS SULFATE 325 (65 FE) MG PO TABS: 325.0000 mg | ORAL_TABLET | Freq: Every day | ORAL | Status: DC

## 2015-08-03 MED ORDER — SERTRALINE HCL 100 MG PO TABS: 100.0000 mg | ORAL_TABLET | Freq: Every day | ORAL | Status: DC

## 2015-08-03 MED ORDER — METOPROLOL TARTRATE 25 MG PO TABS: 25.0000 mg | ORAL_TABLET | Freq: Two times a day (BID) | ORAL | Status: DC

## 2015-08-03 MED ORDER — FUROSEMIDE 40 MG PO TABS: 40.0000 mg | ORAL_TABLET | Freq: Every day | ORAL | Status: DC

## 2015-08-03 MED ORDER — PANTOPRAZOLE SODIUM 40 MG PO TBEC: 40.0000 mg | DELAYED_RELEASE_TABLET | Freq: Two times a day (BID) | ORAL | Status: DC

## 2015-08-03 NOTE — Telephone Encounter (Signed)
Called patient.   She states [facility] was to charge her more for her meds and so [facility]. She states those prescriptions were not cancelled.  Spoke with pharmacist who went out to speak with [name] [name] last Friday:  [name] [name] was frustrated that she would have to pay more for the Ferrous sulfate and Folic Acid, which are not covered by Medicaid, no matter where she gets them and she is able to get them cheaper otc at [facility] or [facility].  Called patient back to consider savings in gas costs if she need not go pick up meds, even though paying more for the two listed above or that the rest are delivered and she was having to pay for the two meds above at another place no matter what.  She states she is done with [facility] no matter what at this point and wants [facility]  [facility]--still with 5 refills for Tramadol Asked they discontinue any current refills of meds I am sending today.  Please call [facility] again and let them know she is canceling for certain with them.

## 2015-09-13 ENCOUNTER — Other Ambulatory Visit: Payer: Self-pay | Admitting: Internal Medicine

## 2015-09-15 ENCOUNTER — Other Ambulatory Visit: Payer: Self-pay

## 2015-09-15 DIAGNOSIS — Z1231 Encounter for screening mammogram for malignant neoplasm of breast: Secondary | ICD-10-CM

## 2015-09-20 ENCOUNTER — Other Ambulatory Visit: Payer: Self-pay | Admitting: Internal Medicine

## 2015-09-20 ENCOUNTER — Encounter: Payer: Self-pay | Admitting: Internal Medicine

## 2015-09-20 DIAGNOSIS — I25119 Atherosclerotic heart disease of native coronary artery with unspecified angina pectoris: Secondary | ICD-10-CM

## 2015-09-20 DIAGNOSIS — E113599 Type 2 diabetes mellitus with proliferative diabetic retinopathy without macular edema, unspecified eye: Secondary | ICD-10-CM

## 2015-09-20 MED ORDER — NITROGLYCERIN 0.4 MG SL SUBL: 0.4000 mg | SUBLINGUAL_TABLET | SUBLINGUAL | Status: DC | PRN

## 2015-09-20 NOTE — Progress Notes (Signed)
   Subjective:    Patient ID: [name] [name], female    DOB: November 04, 1965, 50 y.o.   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 Left eye with atrophy ofglobe and no view to posterior pole.  No further intervention recommended for left eye but patient to bring in surgical records to review. Dr. Rexene Agent with [facility] is a third opinion for the left eye

## 2015-09-24 IMAGING — DX DG ABD PORTABLE 1V
1 series · 1 of 1 positions shown · non-contrast
Comparison: CT scan from 02/21/2015.

CLINICAL DATA: Subsequent encounter for lower abdominal pain with
distention and vomiting.

EXAM:
PORTABLE ABDOMEN - 1 VIEW

[abdomen supine]
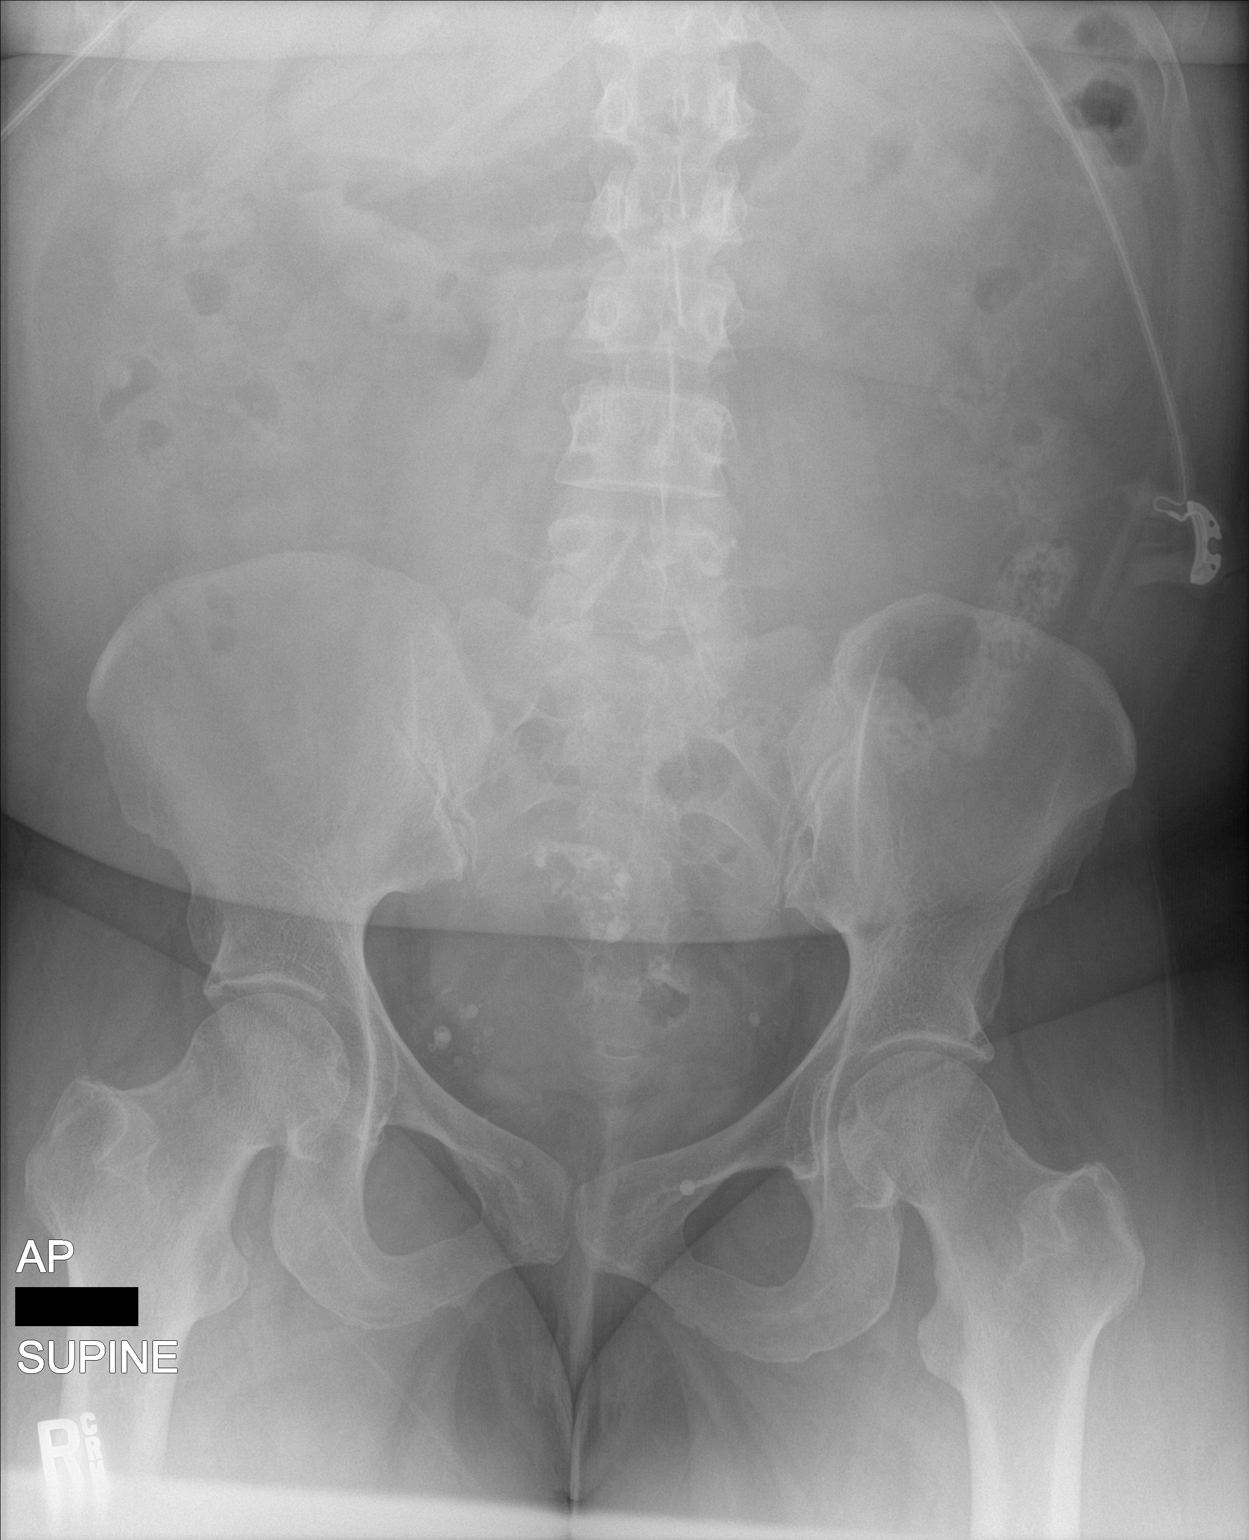

[1 of 1 positions shown; findings below may reference images not displayed]

FINDINGS: Supine portable film at 1278 hours shows no gaseous bowel dilatation
to suggest obstruction. Visualized bony structures are unremarkable.
Coarse calcification over the central anatomic pelvis compatible
with fibroid disease as seen on the previous CT scan. Phleboliths
overlie the inferior anatomic pelvis bilaterally.
IMPRESSION: No evidence for bowel obstruction.

## 2015-09-27 IMAGING — RF DG ESOPHAGUS
8 of 12 series · 12 of 24 positions shown · non-contrast
Comparison: Esophagram [REDACTED] 07/06/2006

CLINICAL DATA: Esophageal stricture and dilation. Esophageal ulcer
in esophagitis patient feels the it food and liquids are stuck in
her chest.

EXAM:
ESOPHOGRAM/BARIUM SWALLOW
TECHNIQUE: Combined double contrast and single contrast examination performed
using effervescent crystals, thick barium liquid, and thin barium
liquid.
FLUOROSCOPY TIME:  Radiation Exposure Index (as provided by the
fluoroscopic device): 506.65 uGy*m2

[Series 1: cp_standard · 0.36mm/px · 2 of 37 frames shown (1 of 5)]
[frame 16/37]
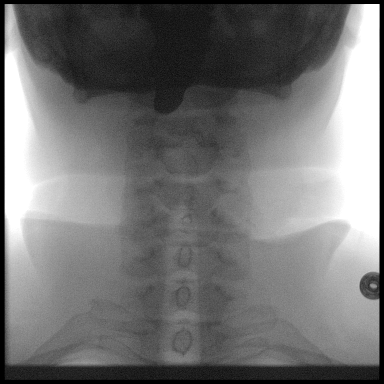
[frame 32/37]
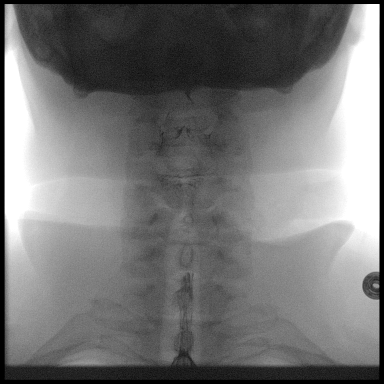

[Series 2: cp_standard · 0.18mm/px · 2 of 49 frames shown (2 of 5)]
[frame 8/49]
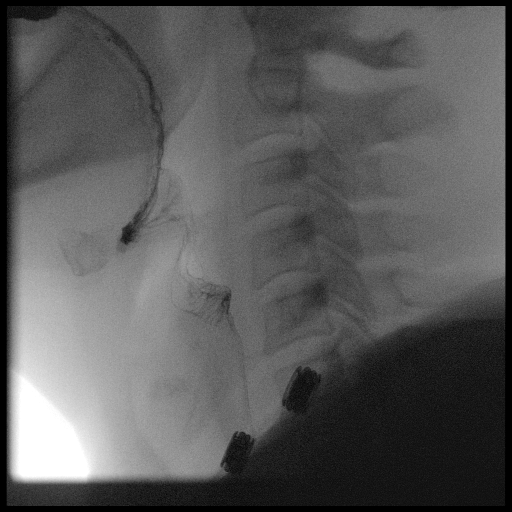
[frame 42/49]
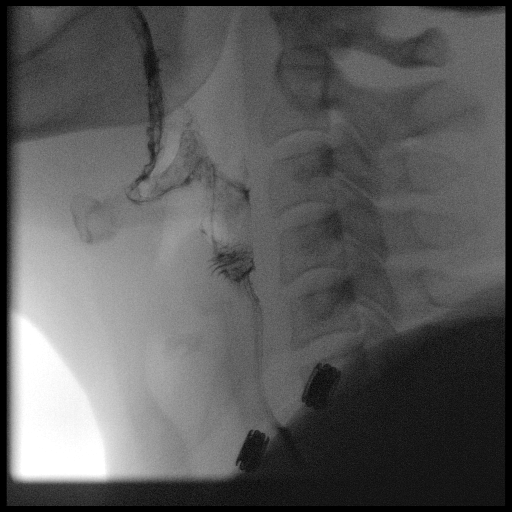

[Series 4: fluoro_barium 2fps_bw · 0.18mm/px · 1 of 1 slices shown (1 of 3)]
[im 1/1]
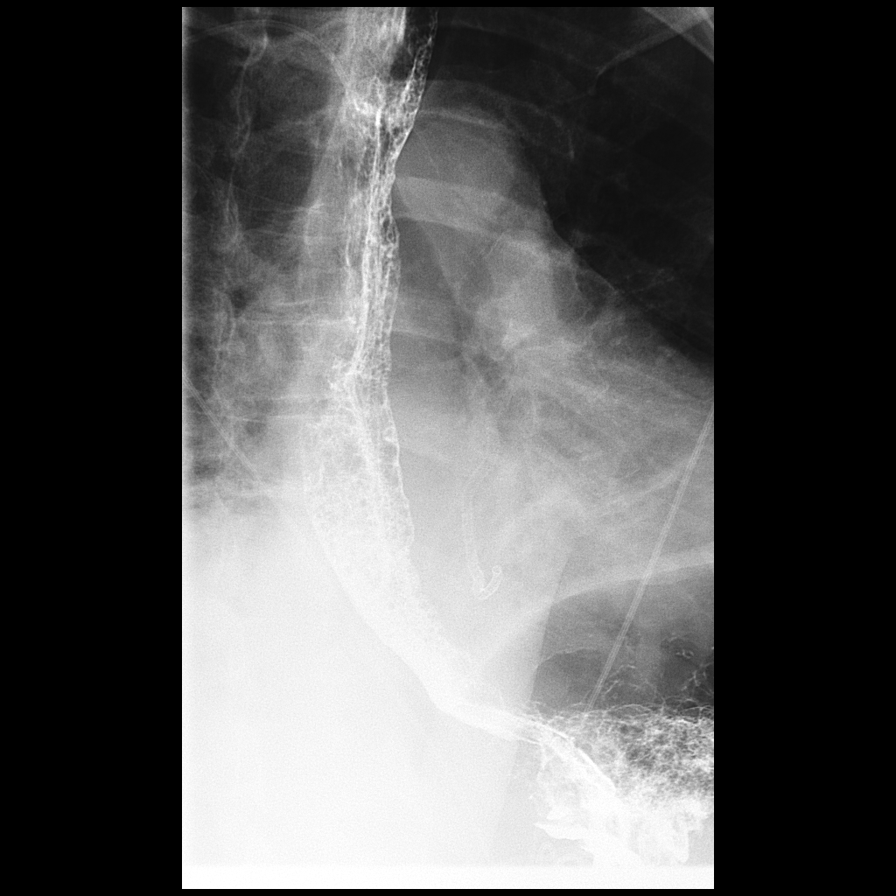

[Series 6: cp_standard · 0.18mm/px · 1 of 1 slices shown (3 of 5)]
[im 1/1]
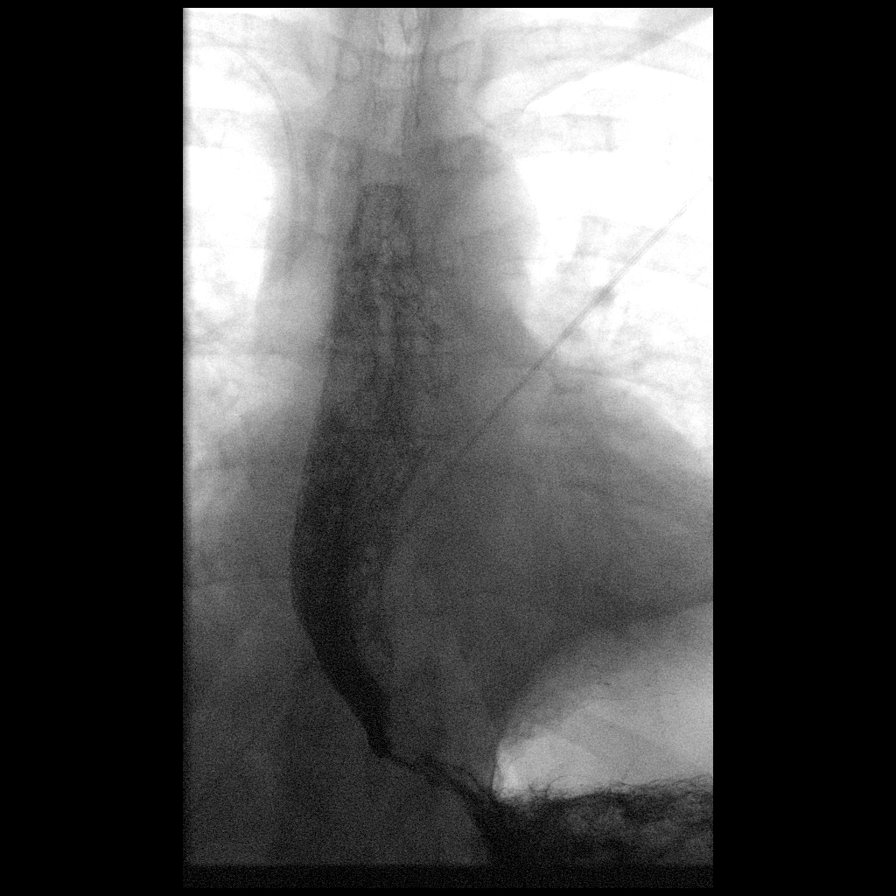

[Series 9: fluoro_barium 2fps_bw · 0.18mm/px · 1 of 1 slices shown (2 of 3)]
[im 1/1]
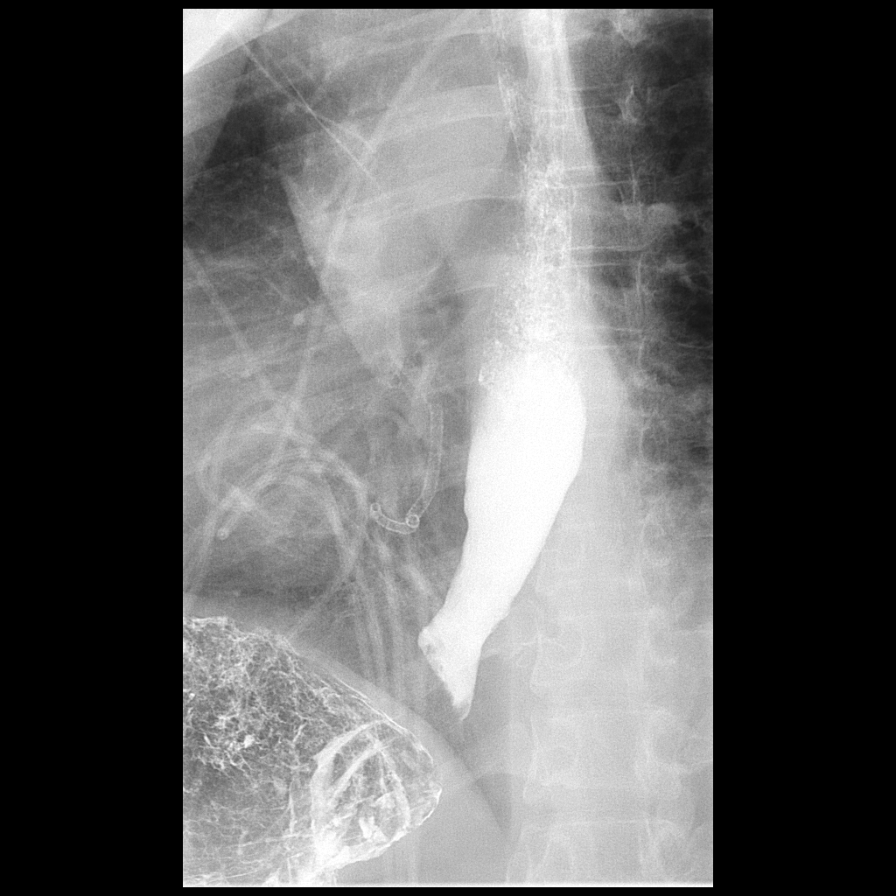

[Series 10: cp_standard · 0.36mm/px · 2 of 7 frames shown (4 of 5)]
[frame 4/7]
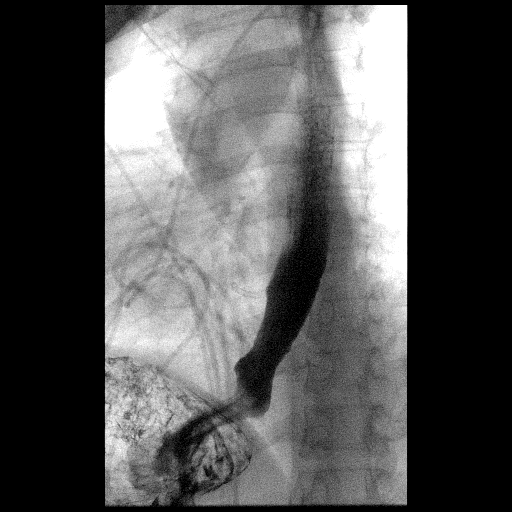
[frame 7/7]
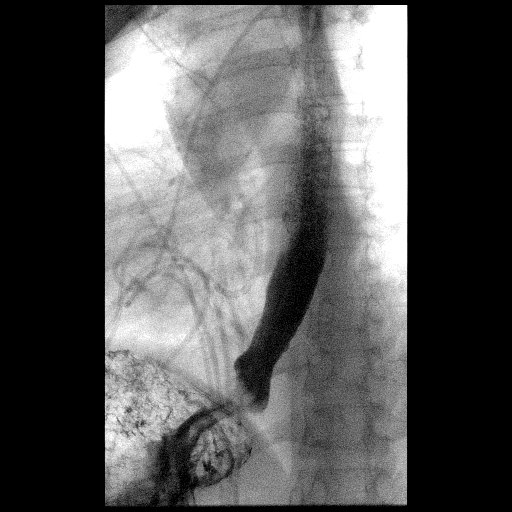

[Series 12: fluoro_barium 2fps_bw · 0.19mm/px · 1 of 1 slices shown (3 of 3)]
[im 1/1]
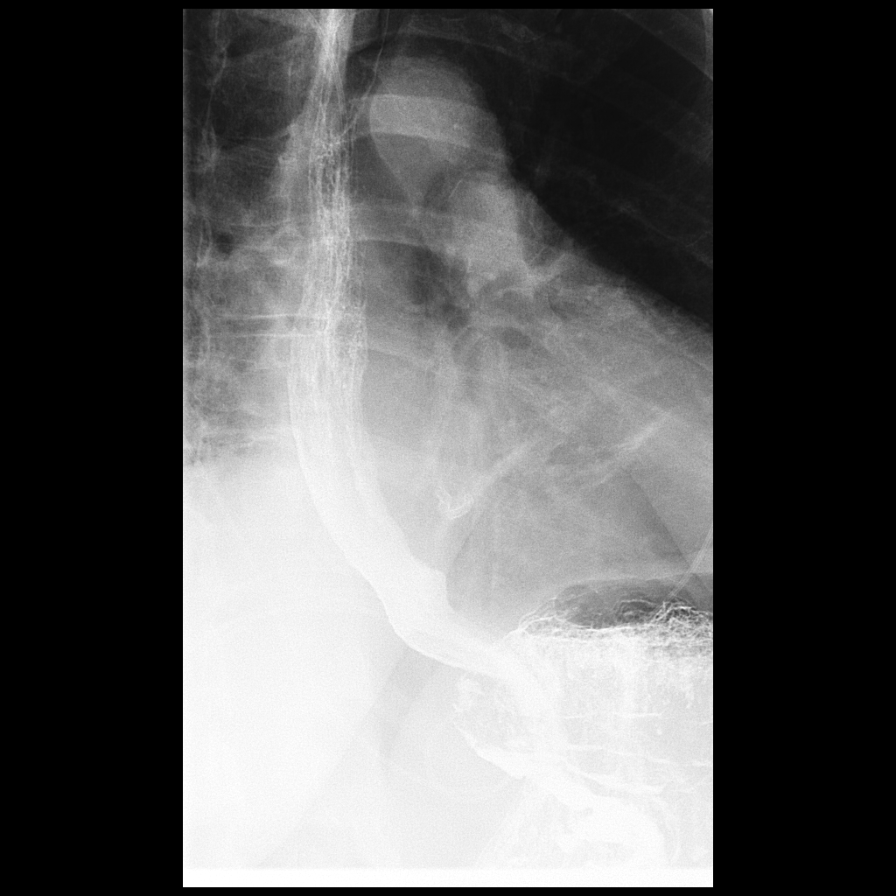

[Series 13: cp_standard · 0.38mm/px · 2 of 17 frames shown (5 of 5)]
[frame 9/17]
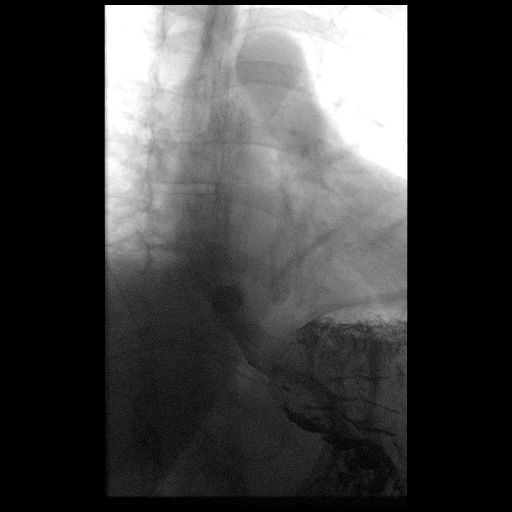
[frame 17/17]
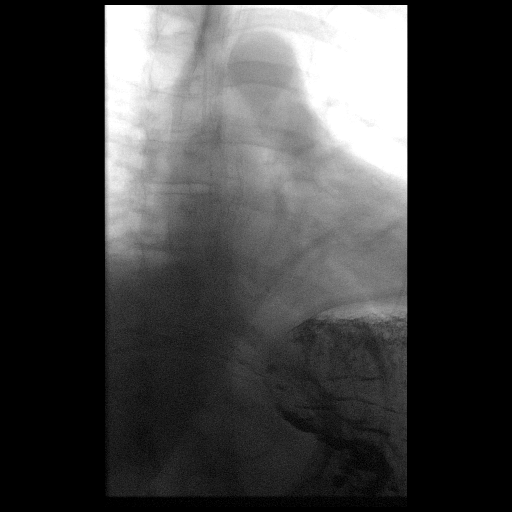

[12 of 24 positions shown; findings below may reference images not displayed]

FINDINGS: Initial laryngeal images demonstrate no significant laryngeal
penetration or aspiration.

The distal esophagus is dilated. There is mucosal irregularity
suggesting hypertrophy. A distal esophageal stricture is again
noted. This may be functional as there is intermittent opening of
the GE junction to a more normal caliber.

Esophageal motility is disorganized with significant stasis of
contrast in the distal esophagus.

A 13 mm barium tablet was administered. This passed freely through
the GE junction.

Dense coronary artery calcifications are noted.
IMPRESSION: 1. Marked esophageal dilation and mucosal hypertrophy without a
definite focal lesion.
2. Marked dysfunction at the GE junction with a functional
stricture. The GE junction does open intermittently to what appears
to be a relatively normal caliber.
3. No definite reflux was present. However, the patient did belch
several times during the exam with significant stasis in the distal
esophagus.

## 2015-10-07 ENCOUNTER — Other Ambulatory Visit: Payer: Self-pay | Admitting: Internal Medicine

## 2015-10-14 ENCOUNTER — Encounter: Payer: Self-pay | Admitting: Internal Medicine

## 2015-10-14 ENCOUNTER — Ambulatory Visit (INDEPENDENT_AMBULATORY_CARE_PROVIDER_SITE_OTHER): Payer: Medicaid Other | Admitting: Internal Medicine

## 2015-10-14 VITALS — BP 138/78 | HR 80 | Ht 60.0 in | Wt 188.0 lb

## 2015-10-14 DIAGNOSIS — M7702 Medial epicondylitis, left elbow: Secondary | ICD-10-CM | POA: Diagnosis not present

## 2015-10-14 DIAGNOSIS — N760 Acute vaginitis: Secondary | ICD-10-CM | POA: Diagnosis not present

## 2015-10-14 DIAGNOSIS — B9689 Other specified bacterial agents as the cause of diseases classified elsewhere: Secondary | ICD-10-CM

## 2015-10-14 DIAGNOSIS — N393 Stress incontinence (female) (male): Secondary | ICD-10-CM | POA: Diagnosis not present

## 2015-10-14 DIAGNOSIS — E118 Type 2 diabetes mellitus with unspecified complications: Secondary | ICD-10-CM

## 2015-10-14 DIAGNOSIS — E113599 Type 2 diabetes mellitus with proliferative diabetic retinopathy without macular edema, unspecified eye: Secondary | ICD-10-CM

## 2015-10-14 DIAGNOSIS — A499 Bacterial infection, unspecified: Secondary | ICD-10-CM | POA: Diagnosis not present

## 2015-10-14 LAB — GLUCOSE, POCT (MANUAL RESULT ENTRY): POC Glucose: 42 mg/dl — AB (ref 70–99)

## 2015-10-14 NOTE — Progress Notes (Signed)
Subjective:    Patient ID: [name] [name], female    DOB: 04/23/1966, 50 y.o.   MRN: [id number]  HPI  1.  Health Maintenance:    Had CPE last year. Goes to [facility] for breast exam/pap/pelvic.  Always normal Has Mammogram next week (end of March)  Always normal Influenza in January, cannot recall exact date--thinks she received at Endocrine.  [facility] also--Dr. Sharl Ma) Pneumovax 23 valent  in 2007.  Has not had PCV 13. Td in 2005. No colonoscopy yet--50 yo.  Goes to Dr Madilyn Fireman.  Sees him twice yearly for upper GI concerns.  2.  Concerns for yeast infection:  Vaginal discharge. White.  No odor or itching.  Is sexually active recently with female partner of 2 months.  Was having discharge before relations with current partner.   Has been unable to wear pessary as she is unable to remove.  She is not interested in surgery--whether a hysterectomy with repair of relaxed tissue or other.  Using Poise pads as loses control of urine. Her Gyn was hoping she could stop utilizing pads with pessary. Pt. Also treated for UTI in January.  Discharge not worse after the antibiotic. A1C in February was 6.9% with Dr. Sharl Ma.  Sugars 85-165.   3.  Left elbow pain:    Started 2 days ago.  Unable to fully extend and pain in medial aspect.  No definite swelling or redness.  Thinks she may have hit it when playing with her cat.   4.  [facility]:    Not able to do anything more for her left eye.  Patient plans to continue now with [facility], Dr. Rexene Agent.  Had laser surgery with right eye 2.02/2016  Last OV with Dr. Caswell Corwin was 10/13/2015.  She may need further surgery in the future and Dr Caswell Corwin has recommended she obtain that at a tertiary care center.   Pt. Reports she is pursuing legal action for her outcome of left eye corneal transplant, etc.   5.  Cardiac:    Dr. Eldridge Dace:  Pt. Taking Atorvastatin 80 mg.  Cholesterol followed by Cardiology. Has not used NTG in 2 years as  no CP.  6.  Rheumatoid Arthritis:   Dr. Kellie Simmering.  Cyclobenzaprine prescribed by Rheum.  Using only as needed at bedtime.  Also taking MTX and Folic acid.  Just had regular labs (every 3 months) with Rheum.)  Tramadol for joint pain--takes at least once daily, especially when cold.  7.  CKD:    States was followed by Nephrology at one time, but cannot remember who her specialist was or when she stopped following with them.   Current outpatient prescriptions:  .  acetaminophen (TYLENOL) 500 MG tablet, Take 500 mg by mouth every 6 (six) hours as needed for moderate pain. , Disp: , Rfl:  .  atorvastatin (LIPITOR) 40 MG tablet, 2 tabs by mouth once daily with evening meal, Disp: 60 tablet, Rfl: 11 .  cyclobenzaprine (FLEXERIL) 10 MG tablet, Take 10 mg by mouth at bedtime., Disp: , Rfl: 3 .  ferrous sulfate 325 (65 FE) MG tablet, Take 1 tablet (325 mg total) by mouth daily with breakfast., Disp: 30 tablet, Rfl: 11 .  folic acid (FOLVITE) 1 MG tablet, Take 1 mg by mouth daily., Disp: , Rfl:  .  furosemide (LASIX) 40 MG tablet, Take 1 tablet (40 mg total) by mouth daily. In the morning, Disp: 30 tablet, Rfl: 11 .  insulin aspart (NOVOLOG)  100 UNIT/ML injection, Inject 4-16 Units into the skin 3 (three) times daily with meals. Sliding scale CBG 100-150; 4 units, 151-200; 6 units, 201-250; 8 units, 251-300; 10 units, 301-350; 12 units, 351-400; 14 units, > 401; call doctor, Disp: , Rfl:  .  insulin glargine (LANTUS) 100 UNIT/ML injection, Inject 16 Units into the skin 2 (two) times daily. , Disp: , Rfl:  .  methotrexate (RHEUMATREX) 2.5 MG tablet, Take 10 mg by mouth 2 (two) times a week. Caution:Chemotherapy. Protect from light., Disp: , Rfl:  .  metoCLOPramide (REGLAN) 10 MG tablet, Take 0.5 tablets (5 mg total) by mouth 4 (four) times daily -  before meals and at bedtime., Disp: 60 tablet, Rfl: 11 .  metoprolol tartrate (LOPRESSOR) 25 MG tablet, Take 1 tablet (25 mg total) by mouth 2 (two) times  daily., Disp: 60 tablet, Rfl: 11 .  nitroGLYCERIN (NITROSTAT) 0.4 MG SL tablet, Place 1 tablet (0.4 mg total) under the tongue every 5 (five) minutes as needed for chest pain., Disp: 24 tablet, Rfl: 1 .  pantoprazole (PROTONIX) 40 MG tablet, Take 1 tablet (40 mg total) by mouth 2 (two) times daily., Disp: 60 tablet, Rfl: 11 .  prednisoLONE acetate (PRED FORTE) 1 % ophthalmic suspension, 1 drop 6 (six) times daily., Disp: , Rfl:  .  sertraline (ZOLOFT) 100 MG tablet, Take 1 tablet (100 mg total) by mouth daily., Disp: 30 tablet, Rfl: 11 .  traMADol (ULTRAM) 50 MG tablet, Take 1 tablet (50 mg total) by mouth every 6 (six) hours as needed. May only fill every 30 days, Disp: 120 tablet, Rfl: 4 .  ondansetron (ZOFRAN) 4 MG tablet, Take 4 mg by mouth every 6 (six) hours as needed for nausea. Reported on 10/14/2015, Disp: , Rfl:  .  promethazine (PHENERGAN) 12.5 MG tablet, Take 1 tablet (12.5 mg total) by mouth every 6 (six) hours as needed for nausea or vomiting. (Patient not taking: Reported on 10/14/2015), Disp: 30 tablet, Rfl: 0   Allergies:  ASA:  GI Bleed       Review of Systems     Objective:   Physical Exam NAD Ate some crackers with peanut butter and water and felt much better Left elbow:  No obvious swelling.  Pt. Unable to voluntarily or passively allow complete extension at elbow due to pain.  Tender over medial epicondyle only Good grip on left.   Good capillary refill of fingers. Pelvic:  Normal external genitalia.  Cervix very low in vaginal canal.  No underlying vaginal or cervical inflammation, but frothy thin white discharge with mild amine odor.  No CMT. Wet prep + for whiff and Clue cells only.       Assessment & Plan:  1.  Health Maintenance:  Pt. Needs PCV 13 and Tdap at PHD--wrote this out for her to obtain at [facility]. Will need colonscopy next year with Dr. Madilyn Fireman. She will send copy of labs from Dr. Ines Bloomer office Mammogram at end of this month  2.  DM, insulin  requiring:  After years of poor control and development of multiple complications, patient now with very good control with A1C at 6.9%.  Followed by Dr. Sharl Ma at [place]  3.  Cardiology:  Dr. Eldridge Dace at [place]:  Asymptomatic.  4.  Diabetic eye retinopathy:  Nothing further available to do for left vision.  Plans to continue to follow with [facility], Dr. Caswell Corwin, who has recommended if she needs further treatment of right eye, she go to a  tertiary care center.  Last laser surgery for right eye was 09/08/2015  5.  RA:  Controlled.  Dr. Kellie Simmering.  Encouraged pt. To sign up for scholarship with [employer] and get into 3 feet to walk laps as cannot swim.  6.  Left medial epicondylitis:  REst, Tylenol. Gentle ROM  7.  Bacterial Vaginosis:  Pt. However is asymptomatic.  Agrees to call if odor becomes an issue or has discomfort with the discharge.  She actually was unaware of any discharge until her gynecologist told her about it. Followup in 4 months.

## 2015-10-14 NOTE — Patient Instructions (Addendum)
You need to get the Tdap and the PCV 13 at the public health department for your regular health maintenance  Wear your left arm in a sling and do gentle range of motion exercises every time you sit down.  Wear the sling to avoid using your left elbow and hand  Call if your elbow is not better in next week.  Take Tylenol as needed for the pain  Call if your vaginal discharge becomes bothersome to you.  You may consider eating 2% yogurt once daily --[place] yogurt if you can find it.  Apply for scholarship to the [employer] and walk back and forth in the 3 feet level of pool--start slow and gradually increase time and laps you walk

## 2015-10-18 ENCOUNTER — Ambulatory Visit
Admission: RE | Admit: 2015-10-18 | Discharge: 2015-10-18 | Disposition: A | Discharge status: Home / Self Care | Payer: Medicaid Other | Source: Ambulatory Visit

## 2015-10-18 DIAGNOSIS — Z1231 Encounter for screening mammogram for malignant neoplasm of breast: Secondary | ICD-10-CM

## 2015-12-30 ENCOUNTER — Telehealth: Payer: Self-pay

## 2015-12-30 NOTE — Telephone Encounter (Signed)
Promethazine. To Dr. Delrae Alfred for approval. Must be faxed if approved.

## 2015-12-31 NOTE — Telephone Encounter (Signed)
Please call patient to schedule an acute appointment for nausea or move up the July appointment please. Patient is aware you are calling. Thank you.

## 2015-12-31 NOTE — Telephone Encounter (Signed)
Called and left voice message to return call

## 2015-12-31 NOTE — Telephone Encounter (Signed)
I have never prescribed this for her.  It appears it was last prescribed with a hospitalization in August of last year with nausea and vomiting. Please call pt. And find out why she is wanting to refill this now.

## 2016-01-03 ENCOUNTER — Encounter: Payer: Self-pay | Admitting: Internal Medicine

## 2016-01-03 ENCOUNTER — Ambulatory Visit (INDEPENDENT_AMBULATORY_CARE_PROVIDER_SITE_OTHER): Payer: Medicaid Other | Admitting: Internal Medicine

## 2016-01-03 VITALS — BP 128/70 | HR 70 | Resp 14 | Ht 61.0 in | Wt 185.0 lb

## 2016-01-03 DIAGNOSIS — E118 Type 2 diabetes mellitus with unspecified complications: Secondary | ICD-10-CM

## 2016-01-03 DIAGNOSIS — R110 Nausea: Secondary | ICD-10-CM | POA: Diagnosis not present

## 2016-01-03 DIAGNOSIS — I10 Essential (primary) hypertension: Secondary | ICD-10-CM | POA: Diagnosis not present

## 2016-01-03 DIAGNOSIS — N393 Stress incontinence (female) (male): Secondary | ICD-10-CM

## 2016-01-03 LAB — GLUCOSE, POCT (MANUAL RESULT ENTRY): POC GLUCOSE: 218 mg/dL — AB (ref 70–99)

## 2016-01-03 MED ORDER — POISE ULTRA THINS PADS: MEDICATED_PAD | Status: AC

## 2016-01-03 NOTE — Progress Notes (Signed)
Subjective:    Patient ID: [name] [name], female    DOB: 25-May-1966, 50 y.o.   MRN: [id number]  HPI  Here at my request as not sure why she was asking for Promethazine, which is not one of her usual meds.  1.  Nausea:  Gets nausea when takes cyclobenzaprine for sleep.  She only takes the cyclobenzaprine when pain is keeping her awake. She was requesting a refill of Promethazine, which was prescribed in the ED in recent months.  She also has taken Zofran, but has not asked for a refill of that.    2.  Urinary incontinence:  Needs pads for stress urinary incontinence.  3.  DM:  Using insulin regularly and following with Endocrine.  Has controlled this much better in recent years.  4.  Essential Hypertension:  Taking meds, no problem.   Current outpatient prescriptions:  .  acetaminophen (TYLENOL) 500 MG tablet, Take 500 mg by mouth every 6 (six) hours as needed for moderate pain. , Disp: , Rfl:  .  atorvastatin (LIPITOR) 40 MG tablet, 2 tabs by mouth once daily with evening meal, Disp: 60 tablet, Rfl: 11 .  cyclobenzaprine (FLEXERIL) 10 MG tablet, Take 10 mg by mouth at bedtime., Disp: , Rfl: 3 .  ferrous sulfate 325 (65 FE) MG tablet, Take 1 tablet (325 mg total) by mouth daily with breakfast., Disp: 30 tablet, Rfl: 11 .  folic acid (FOLVITE) 1 MG tablet, Take 1 mg by mouth daily., Disp: , Rfl:  .  furosemide (LASIX) 40 MG tablet, Take 1 tablet (40 mg total) by mouth daily. In the morning, Disp: 30 tablet, Rfl: 11 .  insulin aspart (NOVOLOG) 100 UNIT/ML injection, Inject 4-16 Units into the skin 3 (three) times daily with meals. Sliding scale CBG 100-150; 4 units, 151-200; 6 units, 201-250; 8 units, 251-300; 10 units, 301-350; 12 units, 351-400; 14 units, > 401; call doctor, Disp: , Rfl:  .  insulin glargine (LANTUS) 100 UNIT/ML injection, Inject 16 Units into the skin 2 (two) times daily. , Disp: , Rfl:  .  methotrexate (RHEUMATREX) 2.5 MG tablet, Take 10 mg by mouth 2 (two) times a week.  Caution:Chemotherapy. Protect from light., Disp: , Rfl:  .  metoCLOPramide (REGLAN) 10 MG tablet, Take 0.5 tablets (5 mg total) by mouth 4 (four) times daily -  before meals and at bedtime., Disp: 60 tablet, Rfl: 11 .  metoprolol tartrate (LOPRESSOR) 25 MG tablet, Take 1 tablet (25 mg total) by mouth 2 (two) times daily., Disp: 60 tablet, Rfl: 11 .  nitroGLYCERIN (NITROSTAT) 0.4 MG SL tablet, Place 1 tablet (0.4 mg total) under the tongue every 5 (five) minutes as needed for chest pain., Disp: 24 tablet, Rfl: 1 .  pantoprazole (PROTONIX) 40 MG tablet, Take 1 tablet (40 mg total) by mouth 2 (two) times daily., Disp: 60 tablet, Rfl: 11 .  prednisoLONE acetate (PRED FORTE) 1 % ophthalmic suspension, 1 drop 6 (six) times daily., Disp: , Rfl:  .  sertraline (ZOLOFT) 100 MG tablet, Take 1 tablet (100 mg total) by mouth daily., Disp: 30 tablet, Rfl: 11 .  traMADol (ULTRAM) 50 MG tablet, Take 1 tablet (50 mg total) by mouth every 6 (six) hours as needed. May only fill every 30 days, Disp: 120 tablet, Rfl: 4 .  Incontinence Supply Disposable (POISE ULTRA THINS) PADS, Use 5 pads daily as needed for urinary stress incontinence, Disp: 150 each, Rfl: 11   Allergies  Allergen Reactions  . Aspirin Other (See  Comments)    Stomach bleeds       Review of Systems     Objective:   Physical Exam NAD, left ptosis and eye color change--chronic Lungs:  CTA CV: RRR without murmur or rub, radial pulses normal and equal Abd:  S, NT, No HSM or mass, + BS       Assessment & Plan:  1.  Intermittent Nausea:  Discussed perhaps taking half tab of Cyclobenzaprine instead of whole 10 mg.  Would like to avoid having her take another medication to treat side effects of another as she is on so many meds.   Pt. Agrees to try that instead.  2.  Urinary incontinence:  Rx for incontinence pads  3.  DM, insulin requiring:  Has been much better controlled.  Continue with Endocrine.  4.  Essential Hypertension:  controlled.  5.  Hot flashes:  Brings up at discharge.  Not a good candidate for ERT.  Try [employer]

## 2016-01-03 NOTE — Patient Instructions (Signed)
BLACK COHOSH for hot flashes

## 2016-01-07 NOTE — Telephone Encounter (Signed)
Patient was seen at office on 01/03/16 @ 4pm for acute visit

## 2016-01-24 ENCOUNTER — Telehealth: Payer: Self-pay | Admitting: Internal Medicine

## 2016-01-24 NOTE — Telephone Encounter (Signed)
Patient would like Rx refill for TraMADol.  [facility] would [facility].  Patient contact information [phone]

## 2016-01-25 ENCOUNTER — Other Ambulatory Visit: Payer: Self-pay

## 2016-01-25 ENCOUNTER — Other Ambulatory Visit: Payer: Self-pay | Admitting: Family Medicine

## 2016-01-25 MED ORDER — TRAMADOL HCL 50 MG PO TABS: 50.0000 mg | ORAL_TABLET | Freq: Four times a day (QID) | ORAL | Status: DC | PRN

## 2016-01-25 MED ORDER — FERROUS SULFATE 325 (65 FE) MG PO TABS: 325.0000 mg | ORAL_TABLET | Freq: Every day | ORAL | Status: DC

## 2016-02-01 IMAGING — US US ABDOMEN LIMITED
1 series · 14 of 25 positions shown · non-contrast
Comparison: CT abdomen pelvis dated 02/21/2015

CLINICAL DATA: Right upper quadrant abdominal pain since 02/21/2015

EXAM:
US ABDOMEN LIMITED - RIGHT UPPER QUADRANT

[Series 1: us abdomen limited · 0.24mm/px · 14 of 46 slices shown]
[im 1/46]
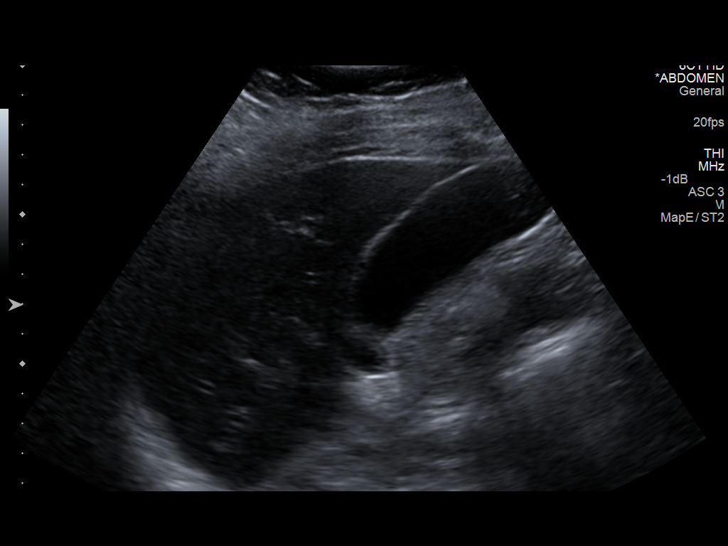
[im 4/46]
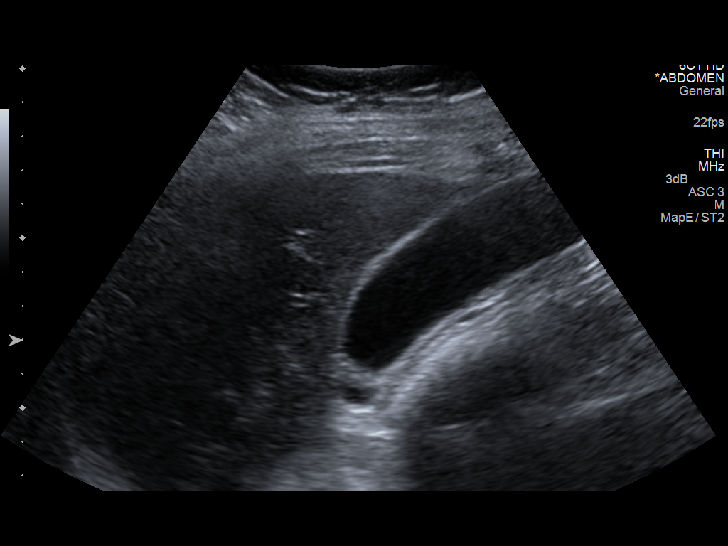
[im 8/46]
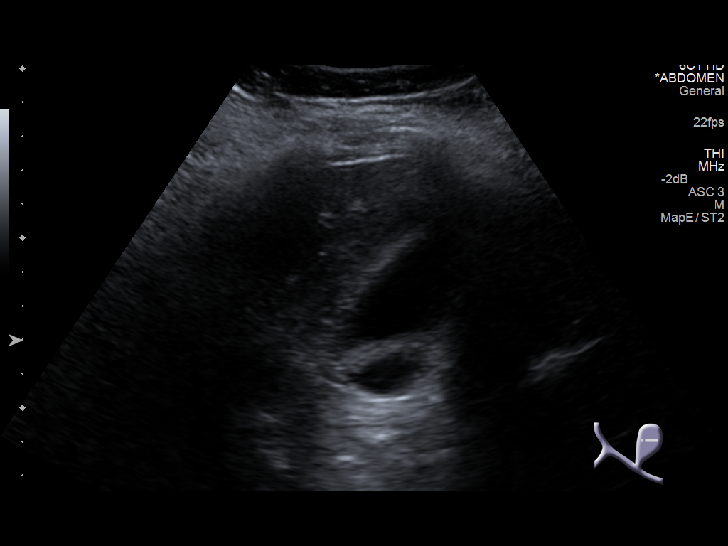
[im 12/46]
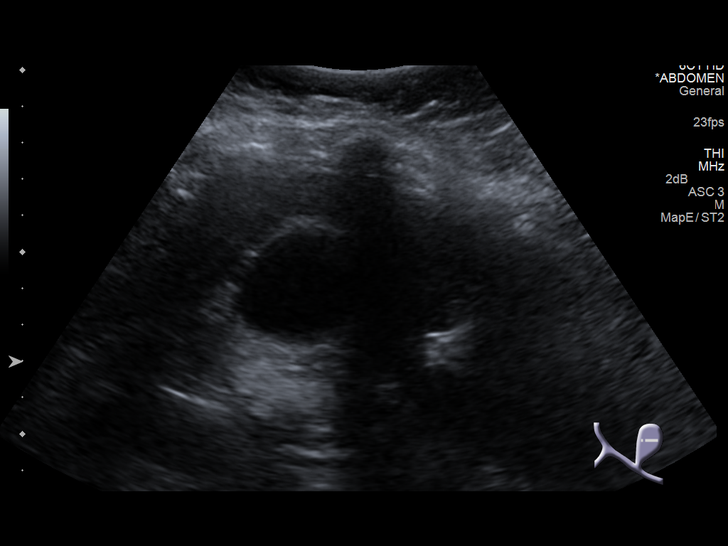
[im 16/46]
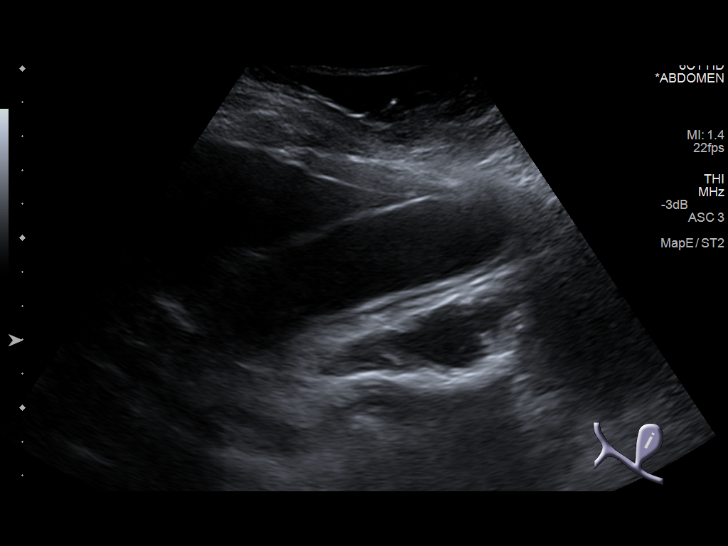
[im 17/46]
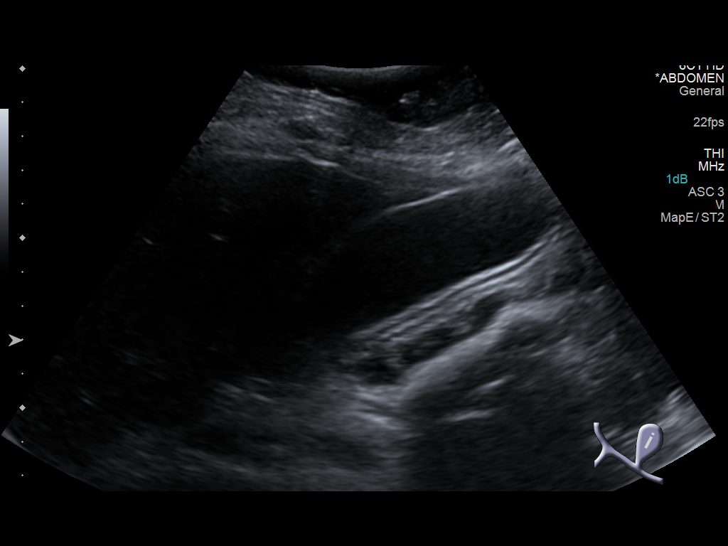
[im 21/46]
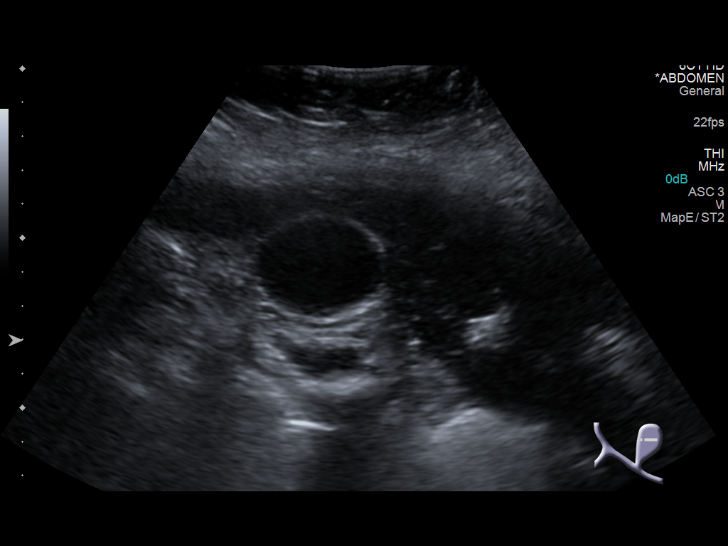
[im 25/46]
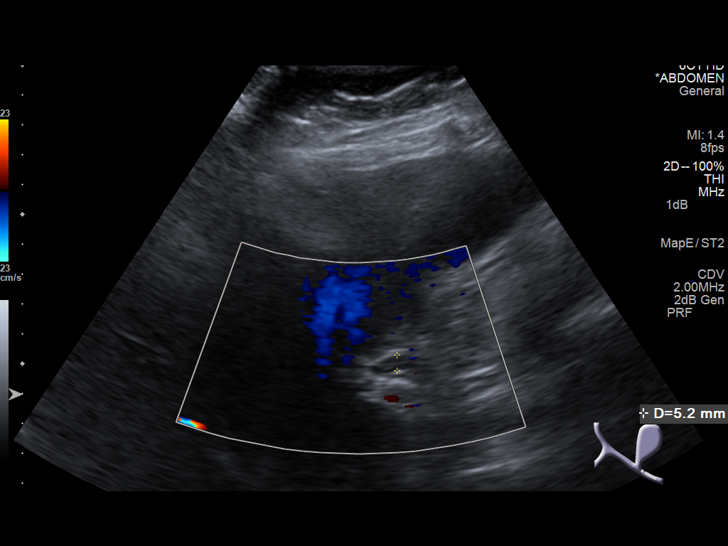
[im 29/46]
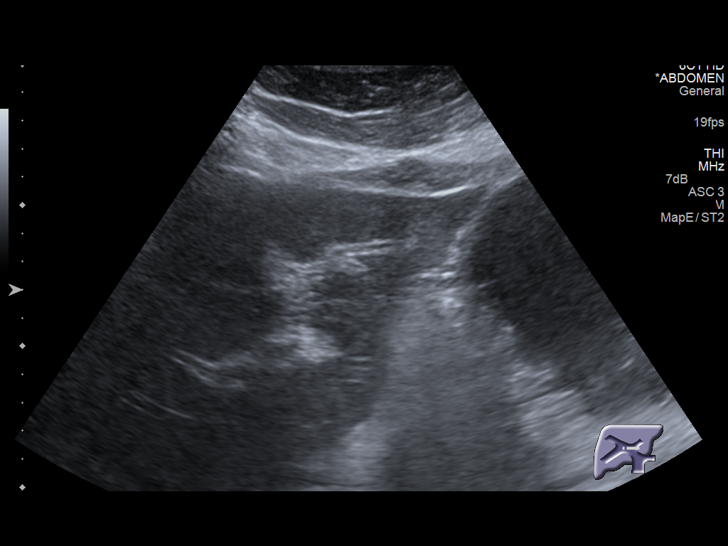
[im 31/46]
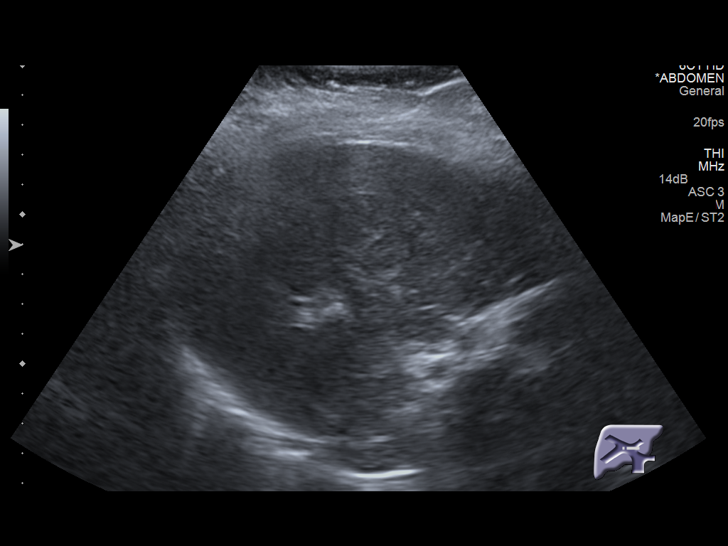
[im 34/46]
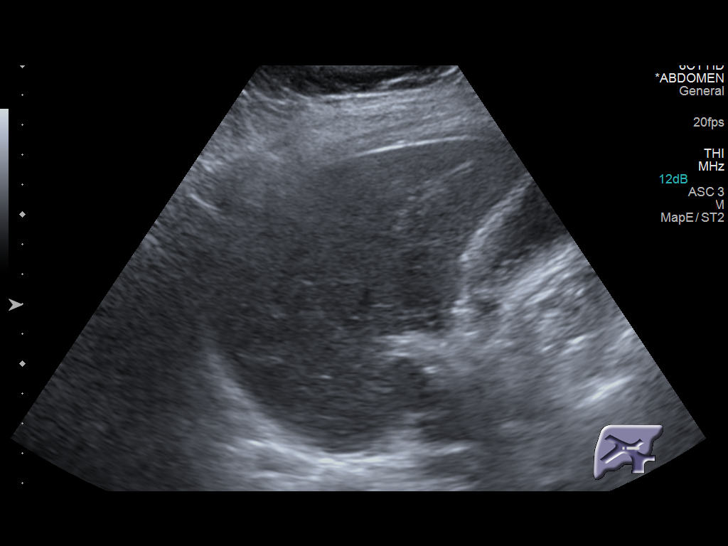
[im 38/46]
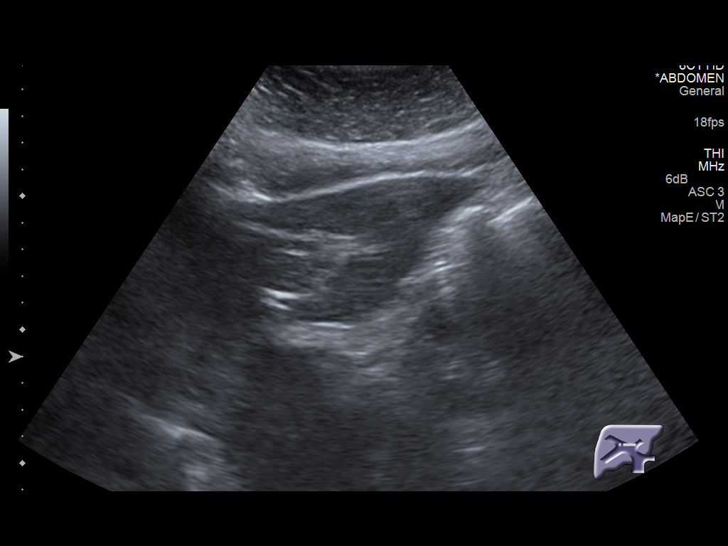
[im 42/46]
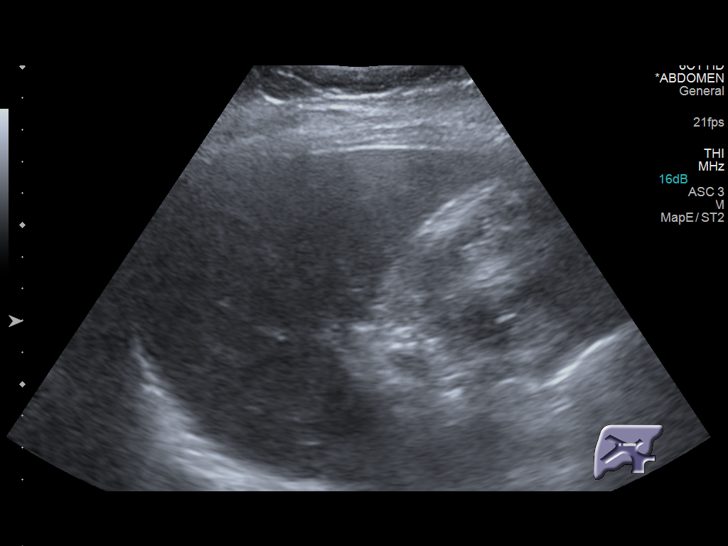
[im 46/46]
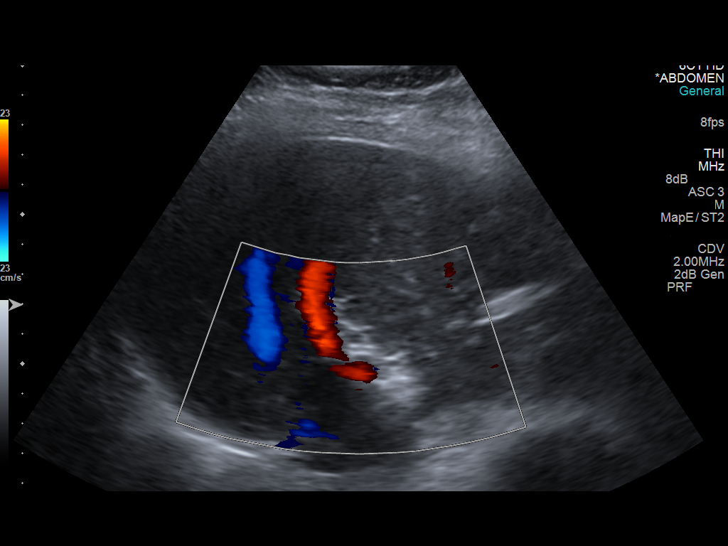

[14 of 25 positions shown; findings below may reference images not displayed]

FINDINGS: Gallbladder:

No gallstones, gallbladder wall thickening, or pericholecystic
fluid. Negative sonographic Murphy's sign.

Common bile duct:

Diameter: 5 mm

Liver:

No focal lesion identified. Within normal limits in parenchymal
echogenicity.
IMPRESSION: Negative right upper quadrant ultrasound.

## 2016-02-14 ENCOUNTER — Ambulatory Visit (INDEPENDENT_AMBULATORY_CARE_PROVIDER_SITE_OTHER): Payer: Medicaid Other | Admitting: Internal Medicine

## 2016-02-14 ENCOUNTER — Encounter: Payer: Self-pay | Admitting: Internal Medicine

## 2016-02-14 VITALS — BP 124/70 | HR 78 | Temp 98.3°F | Resp 18 | Ht 61.0 in | Wt 184.0 lb

## 2016-02-14 DIAGNOSIS — M0579 Rheumatoid arthritis with rheumatoid factor of multiple sites without organ or systems involvement: Secondary | ICD-10-CM

## 2016-02-14 DIAGNOSIS — Z780 Asymptomatic menopausal state: Secondary | ICD-10-CM | POA: Diagnosis not present

## 2016-02-14 DIAGNOSIS — E118 Type 2 diabetes mellitus with unspecified complications: Secondary | ICD-10-CM | POA: Diagnosis not present

## 2016-02-14 DIAGNOSIS — M62830 Muscle spasm of back: Secondary | ICD-10-CM

## 2016-02-14 DIAGNOSIS — I10 Essential (primary) hypertension: Secondary | ICD-10-CM | POA: Diagnosis not present

## 2016-02-14 LAB — GLUCOSE, POCT (MANUAL RESULT ENTRY): POC GLUCOSE: 72 mg/dL (ref 70–99)

## 2016-02-14 MED ORDER — SERTRALINE HCL 100 MG PO TABS: ORAL_TABLET | ORAL | Status: DC

## 2016-02-14 NOTE — Progress Notes (Addendum)
Subjective:    Patient ID: [name] [name], female    DOB: [birth year], 50 y.o.   MRN: [id number]  HPI   1.  DM, type 2, insulin requiring:  Follows with Dr. Sharl Ma, Endocrinology at [facility].  She states she had her A1C checked 2 weeks ago, fasting on follow up.  Was told everything was fine.  She does not know the number.  Follow up in 3 months.  2.  Rheumatoid Arthritis:  Dr. Reeves Dam.  Fell July the 8th going up steps and fell backward on buttocks and apparently hit her right shoulder.  Saw Dr. Reeves Dam 2 days later and had xrays of shoulders.  Xrays of right shoulder were negative for bony injury.  That shoulder is really not bothering her any longer. Was seen by Dr. Sharl Ma on the 11th, given some sort of what sounds like a vaccine in her left arm.  The next day, her tooth started hurting and her left arm swelled with increased pain. Left arm just continues to hurt--does not sound related to her RA.  Dr. Reeves Dam will no longer be able to follow her after January as he can no longer afford to see Medicaid patients.  She is willing to look for another specialist after her October visit.  3.  Abscessed tooth:  Saw Dr. Tristan Schroeder, DDS, who is located on [address] 3 days ago and has been taking PCN500 mg 4 times daily.  Dental pain has improved.  Has been able to wear partials today due to decreased pain.  4.  Hyperlipidemia:  Had cholesterol checked with recent bloodwork she thinks.  As far as she knows, everything was fine.  5.  Hot flashes:  Did not try Black Cohosh--just forgot about it.  Very moody as well.  Would be willing to increase her Sertraline to see if it would help with these symptoms.   6.  Diabetic eye concerns:  Now seeing specialist in [facility].  Dr. Berneta Sages.  She stopped the prednisolone eyedrops.  Has follow up with her in September.    7.  HM:  Last pap was about 1-2 years ago.  [facility].     Current outpatient prescriptions:  .  acetaminophen (TYLENOL)  500 MG tablet, Take 500 mg by mouth every 6 (six) hours as needed for moderate pain. , Disp: , Rfl:  .  atorvastatin (LIPITOR) 40 MG tablet, 2 tabs by mouth once daily with evening meal, Disp: 60 tablet, Rfl: 11 .  cyclobenzaprine (FLEXERIL) 10 MG tablet, Take 10 mg by mouth at bedtime., Disp: , Rfl: 3 .  ferrous sulfate 325 (65 FE) MG tablet, Take 1 tablet (325 mg total) by mouth daily with breakfast., Disp: 90 tablet, Rfl: 3 .  folic acid (FOLVITE) 1 MG tablet, Take 1 mg by mouth daily., Disp: , Rfl:  .  furosemide (LASIX) 40 MG tablet, Take 1 tablet (40 mg total) by mouth daily. In the morning, Disp: 30 tablet, Rfl: 11 .  Incontinence Supply Disposable (POISE ULTRA THINS) PADS, Use 5 pads daily as needed for urinary stress incontinence, Disp: 150 each, Rfl: 11 .  insulin aspart (NOVOLOG) 100 UNIT/ML injection, Inject 4-16 Units into the skin 3 (three) times daily with meals. Sliding scale CBG 100-150; 4 units, 151-200; 6 units, 201-250; 8 units, 251-300; 10 units, 301-350; 12 units, 351-400; 14 units, > 401; call doctor, Disp: , Rfl:  .  insulin glargine (LANTUS) 100 UNIT/ML injection, Inject 16 Units into the  skin 2 (two) times daily. , Disp: , Rfl:  .  methotrexate (RHEUMATREX) 2.5 MG tablet, Take 10 mg by mouth 2 (two) times a week. Caution:Chemotherapy. Protect from light., Disp: , Rfl:  .  metoCLOPramide (REGLAN) 10 MG tablet, Take 0.5 tablets (5 mg total) by mouth 4 (four) times daily -  before meals and at bedtime., Disp: 60 tablet, Rfl: 11 .  metoprolol tartrate (LOPRESSOR) 25 MG tablet, Take 1 tablet (25 mg total) by mouth 2 (two) times daily., Disp: 60 tablet, Rfl: 11 .  nitroGLYCERIN (NITROSTAT) 0.4 MG SL tablet, Place 1 tablet (0.4 mg total) under the tongue every 5 (five) minutes as needed for chest pain., Disp: 24 tablet, Rfl: 1 .  pantoprazole (PROTONIX) 40 MG tablet, Take 1 tablet (40 mg total) by mouth 2 (two) times daily., Disp: 60 tablet, Rfl: 11 .  penicillin v potassium (VEETID)  500 MG tablet, Take 500 mg by mouth every 6 (six) hours., Disp: , Rfl:  .  sertraline (ZOLOFT) 100 MG tablet, Take 1 tablet (100 mg total) by mouth daily., Disp: 30 tablet, Rfl: 11 .  traMADol (ULTRAM) 50 MG tablet, Take 1 tablet (50 mg total) by mouth every 6 (six) hours as needed., Disp: 90 tablet, Rfl: 0 .  prednisoLONE acetate (PRED FORTE) 1 % ophthalmic suspension, 1 drop 6 (six) times daily. Reported on 02/14/2016, Disp: , Rfl:    Allergies  Allergen Reactions  . Aspirin Other (See Comments)    Stomach bleeds               Review of Systems     Objective:   Physical Exam NAD HEENT:  No swelling or redness of gingiva around right upper molar causing her pain.  Throat without injection Neck:  Supple, no adenopathy Very tender across left trapezius and less tender over trap.  No swelling or erythema, no increased warmth  Decreased ROM of left shoulder due to pain Chest:  CTA CV:  RRR without murmur or rub, radial pulses normal and equal       Assessment & Plan:  1.  DM, insulin requiring:  Need Dr. Daune Perch records and to document what injection she received there.  Does not appear to have a significant local reaction today.  Reportedly controlled.  Foot exam reportedly stable.    2.  Hyperlipidemia:  As above  3.  Left shoulder/trap strain/pain:  Discussed warm packing followed by gentle stretching and ROM exercises.  To call if no improvement in 1 week and will refer to PT  4.  RA:  As per Dr. Reeves Dam.  She prefers to continue with him until January.  Right shoulder issue following fall seems resolved.  To get a sturdy railing up to her front porch.  She believes her boyfriend can do this.  5.  Abscessed Tooth:  Improved with PCN V K.  Continue with dentist  6.  Diabetic Eye:  To continue at [facility] with Dr. Berneta Sages.  7.  Menopausal symptoms:  Initially planned to increase Sertraline, but looking at meds, she is also taking Tramadol, so will hold on increasing  Sertraling to avoid serotonin syndrome/increased risk of seizure.  8.  Health Maintenance:  Return for CPE without pap as she plans to continue with [facility] in 4 months to address HM issues.

## 2016-02-14 NOTE — Patient Instructions (Signed)
Heating pad to left shoulder/back for 15-20 minutes followed by gentle stretching and range of motion with the widening circles with your arm and shoulder twice daily. Stand up slowly after performing the range of motion. Get a sturdy rail installed at your porch step. Call if you shoulder is no better in 1 week and will refer to PT.

## 2016-02-22 ENCOUNTER — Other Ambulatory Visit: Payer: Self-pay | Admitting: Internal Medicine

## 2016-02-22 MED ORDER — TRAMADOL HCL 50 MG PO TABS
50.0000 mg | ORAL_TABLET | Freq: Four times a day (QID) | ORAL | 0 refills | Status: DC | PRN
Start: 2016-02-22 — End: 2016-03-24

## 2016-03-06 ENCOUNTER — Other Ambulatory Visit: Payer: Self-pay | Admitting: Internal Medicine

## 2016-03-06 DIAGNOSIS — R1011 Right upper quadrant pain: Secondary | ICD-10-CM

## 2016-03-07 ENCOUNTER — Other Ambulatory Visit: Payer: Self-pay | Admitting: Internal Medicine

## 2016-03-07 DIAGNOSIS — E1022 Type 1 diabetes mellitus with diabetic chronic kidney disease: Secondary | ICD-10-CM

## 2016-03-07 DIAGNOSIS — R1011 Right upper quadrant pain: Secondary | ICD-10-CM

## 2016-03-07 DIAGNOSIS — R748 Abnormal levels of other serum enzymes: Secondary | ICD-10-CM

## 2016-03-13 ENCOUNTER — Ambulatory Visit
Admission: RE | Admit: 2016-03-13 | Discharge: 2016-03-13 | Disposition: A | Discharge status: Home / Self Care | Payer: Medicaid Other | Source: Ambulatory Visit | Attending: Internal Medicine | Admitting: Internal Medicine

## 2016-03-13 DIAGNOSIS — R748 Abnormal levels of other serum enzymes: Secondary | ICD-10-CM

## 2016-03-13 DIAGNOSIS — R1011 Right upper quadrant pain: Secondary | ICD-10-CM

## 2016-03-13 DIAGNOSIS — E1022 Type 1 diabetes mellitus with diabetic chronic kidney disease: Secondary | ICD-10-CM

## 2016-03-23 ENCOUNTER — Telehealth: Payer: Self-pay

## 2016-03-23 NOTE — Telephone Encounter (Signed)
Patient walked in to pick up her handicap sticker application and requested a refill of Tramadol.

## 2016-03-24 ENCOUNTER — Other Ambulatory Visit: Payer: Self-pay | Admitting: Internal Medicine

## 2016-03-24 MED ORDER — TRAMADOL HCL 50 MG PO TABS: 50.0000 mg | ORAL_TABLET | Freq: Four times a day (QID) | ORAL | 4 refills | Status: DC | PRN

## 2016-03-24 NOTE — Telephone Encounter (Signed)
[facility]

## 2016-03-24 NOTE — Telephone Encounter (Signed)
See telephone note for same refill--done.

## 2016-04-13 ENCOUNTER — Telehealth: Payer: Self-pay | Admitting: Internal Medicine

## 2016-04-13 NOTE — Telephone Encounter (Signed)
Patient called stating Dr. Delrae Alfred referred her to the eye doctor and she was seen today. Eye doctor told her she needs to see retina specialist in [place]- [phone] but needs to contact PCP to get Authorization since she has Medicaid

## 2016-04-14 NOTE — Telephone Encounter (Signed)
Authorization given

## 2016-04-20 ENCOUNTER — Emergency Department (HOSPITAL_COMMUNITY): Payer: Medicaid Other

## 2016-04-20 ENCOUNTER — Encounter (HOSPITAL_COMMUNITY): Payer: Self-pay

## 2016-04-20 ENCOUNTER — Emergency Department (HOSPITAL_COMMUNITY)
Admission: EM | Admit: 2016-04-20 | Discharge: 2016-04-20 | Disposition: A | Discharge status: Home / Self Care | Payer: Medicaid Other | Attending: Emergency Medicine | Admitting: Emergency Medicine

## 2016-04-20 DIAGNOSIS — Z955 Presence of coronary angioplasty implant and graft: Secondary | ICD-10-CM | POA: Insufficient documentation

## 2016-04-20 DIAGNOSIS — Z8673 Personal history of transient ischemic attack (TIA), and cerebral infarction without residual deficits: Secondary | ICD-10-CM | POA: Insufficient documentation

## 2016-04-20 DIAGNOSIS — I5022 Chronic systolic (congestive) heart failure: Secondary | ICD-10-CM | POA: Diagnosis not present

## 2016-04-20 DIAGNOSIS — Z79899 Other long term (current) drug therapy: Secondary | ICD-10-CM | POA: Insufficient documentation

## 2016-04-20 DIAGNOSIS — N183 Chronic kidney disease, stage 3 (moderate): Secondary | ICD-10-CM | POA: Diagnosis not present

## 2016-04-20 DIAGNOSIS — E1122 Type 2 diabetes mellitus with diabetic chronic kidney disease: Secondary | ICD-10-CM | POA: Insufficient documentation

## 2016-04-20 DIAGNOSIS — K3184 Gastroparesis: Secondary | ICD-10-CM | POA: Insufficient documentation

## 2016-04-20 DIAGNOSIS — R109 Unspecified abdominal pain: Secondary | ICD-10-CM | POA: Diagnosis present

## 2016-04-20 DIAGNOSIS — I252 Old myocardial infarction: Secondary | ICD-10-CM | POA: Insufficient documentation

## 2016-04-20 DIAGNOSIS — I2510 Atherosclerotic heart disease of native coronary artery without angina pectoris: Secondary | ICD-10-CM | POA: Diagnosis not present

## 2016-04-20 DIAGNOSIS — F17210 Nicotine dependence, cigarettes, uncomplicated: Secondary | ICD-10-CM | POA: Insufficient documentation

## 2016-04-20 DIAGNOSIS — Z794 Long term (current) use of insulin: Secondary | ICD-10-CM | POA: Diagnosis not present

## 2016-04-20 DIAGNOSIS — I130 Hypertensive heart and chronic kidney disease with heart failure and stage 1 through stage 4 chronic kidney disease, or unspecified chronic kidney disease: Secondary | ICD-10-CM | POA: Diagnosis not present

## 2016-04-20 DIAGNOSIS — E11319 Type 2 diabetes mellitus with unspecified diabetic retinopathy without macular edema: Secondary | ICD-10-CM | POA: Diagnosis not present

## 2016-04-20 LAB — COMPREHENSIVE METABOLIC PANEL
ALT: 10 U/L — ABNORMAL LOW (ref 14–54)
AST: 24 U/L (ref 15–41)
Albumin: 4.3 g/dL (ref 3.5–5.0)
Alkaline Phosphatase: 137 U/L — ABNORMAL HIGH (ref 38–126)
Anion gap: 13 (ref 5–15)
BUN: 24 mg/dL — ABNORMAL HIGH (ref 6–20)
CO2: 17 mmol/L — ABNORMAL LOW (ref 22–32)
Calcium: 10.2 mg/dL (ref 8.9–10.3)
Chloride: 107 mmol/L (ref 101–111)
Creatinine, Ser: 1.69 mg/dL — ABNORMAL HIGH (ref 0.44–1.00)
GFR calc Af Amer: 40 mL/min — ABNORMAL LOW (ref >60)
GFR calc non Af Amer: 34 mL/min — ABNORMAL LOW (ref >60)
Glucose, Bld: 148 mg/dL — ABNORMAL HIGH (ref 65–99)
Potassium: 4.1 mmol/L (ref 3.5–5.1)
Sodium: 137 mmol/L (ref 135–145)
Total Bilirubin: 0.9 mg/dL (ref 0.3–1.2)
Total Protein: 8.5 g/dL — ABNORMAL HIGH (ref 6.5–8.1)

## 2016-04-20 LAB — CBC WITH DIFFERENTIAL/PLATELET
Basophils Absolute: 0 10*3/uL (ref 0.0–0.1)
Basophils Relative: 0 %
Eosinophils Absolute: 0.1 10*3/uL (ref 0.0–0.7)
Eosinophils Relative: 1 %
HCT: 43.9 % (ref 36.0–46.0)
Hemoglobin: 14.2 g/dL (ref 12.0–15.0)
Lymphocytes Relative: 9 %
Lymphs Abs: 1.2 10*3/uL (ref 0.7–4.0)
MCH: 30.9 pg (ref 26.0–34.0)
MCHC: 32.3 g/dL (ref 30.0–36.0)
MCV: 95.6 fL (ref 78.0–100.0)
Monocytes Absolute: 0.9 10*3/uL (ref 0.1–1.0)
Monocytes Relative: 7 %
Neutro Abs: 11.1 10*3/uL — ABNORMAL HIGH (ref 1.7–7.7)
Neutrophils Relative %: 84 %
Platelets: 387 10*3/uL (ref 150–400)
RBC: 4.59 MIL/uL (ref 3.87–5.11)
RDW: 17.7 % — ABNORMAL HIGH (ref 11.5–15.5)
WBC: 13.3 10*3/uL — ABNORMAL HIGH (ref 4.0–10.5)

## 2016-04-20 LAB — I-STAT CHEM 8, ED
BUN: 19 mg/dL (ref 6–20)
CHLORIDE: 118 mmol/L — AB (ref 101–111)
Calcium, Ion: 0.95 mmol/L — ABNORMAL LOW (ref 1.15–1.40)
Creatinine, Ser: 1 mg/dL (ref 0.44–1.00)
GLUCOSE: 112 mg/dL — AB (ref 65–99)
HEMATOCRIT: 29 % — AB (ref 36.0–46.0)
HEMOGLOBIN: 9.9 g/dL — AB (ref 12.0–15.0)
POTASSIUM: 2.6 mmol/L — AB (ref 3.5–5.1)
SODIUM: 147 mmol/L — AB (ref 135–145)
TCO2: 15 mmol/L (ref 0–100)

## 2016-04-20 LAB — I-STAT TROPONIN, ED: TROPONIN I, POC: 0 ng/mL (ref 0.00–0.08)

## 2016-04-20 LAB — CBG MONITORING, ED: Glucose-Capillary: 193 mg/dL — ABNORMAL HIGH (ref 65–99)

## 2016-04-20 MED ORDER — SODIUM CHLORIDE 0.9 % IV BOLUS (SEPSIS): 1000.0000 mL | Freq: Once | INTRAVENOUS | Status: DC

## 2016-04-20 MED ORDER — POTASSIUM CHLORIDE 10 MEQ/100ML IV SOLN: 10.0000 meq | Freq: Once | INTRAVENOUS | Status: DC

## 2016-04-20 MED ORDER — ONDANSETRON HCL 4 MG/2ML IJ SOLN
4.0000 mg | Freq: Once | INTRAMUSCULAR | Status: DC
  Filled 2016-04-20: qty 2

## 2016-04-20 MED ORDER — ONDANSETRON 4 MG PO TBDP
4.0000 mg | ORAL_TABLET | Freq: Once | ORAL | Status: AC
  Administered 2016-04-20: 4 mg via ORAL
  Filled 2016-04-20: qty 1

## 2016-04-20 MED ORDER — LIDOCAINE HCL (PF) 1 % IJ SOLN
INTRAMUSCULAR | Status: AC
  Filled 2016-04-20: qty 5

## 2016-04-20 MED ORDER — LIDOCAINE HCL (PF) 1 % IJ SOLN
5.0000 mL | Freq: Once | INTRAMUSCULAR | Status: AC
  Administered 2016-04-20: 5 mL

## 2016-04-20 NOTE — ED Notes (Signed)
Pt given water for PO hydration 

## 2016-04-20 NOTE — ED Notes (Signed)
Pt to US.

## 2016-04-20 NOTE — ED Notes (Signed)
Attempted to help pt stand up, but asked if she could keep the 5 lead wires attached to monitor heart rhythms. Pt then yanked the wires out of the input and proceeded to yell at EMT stating "I'm ready to leave and I got to go to the bathroom". Pt stood up and walked to bathroom with no assistance.

## 2016-04-20 NOTE — ED Notes (Signed)
Pt taken for xray via bed 

## 2016-04-20 NOTE — ED Notes (Signed)
Pt provided with [place] sandwich and applesauce

## 2016-04-20 NOTE — ED Provider Notes (Signed)
[facility]-EMERGENCY DEPT Provider Note   CSN: [id number] Arrival date & time: 04/20/16  1203     History   Chief Complaint Chief Complaint  Patient presents with  . Chest Pain  . Abdominal Pain    HPI [name] [name] is a 50 y.o. female.  HPI   50 year old female presents today with complaints of abdominal and chest pain. Patient has significant past medical history of diabetic gastroparesis. She notes that this morning she started experiencing severe abdominal pain and lower chest pain. She reports this feels like "severe indigestion". She notes associated vomiting times one, and persistent diarrhea. Patient reports attempts at by mouth causes immediate diarrhea. She reports symptoms are similar to previous episodes, notes that generally she has these episodes once per week, today more severe. Patient denies any preceding illness.   Past Medical History:  Diagnosis Date  . Acute myocardial infarction of other lateral wall, initial episode of care   . Acute myocardial infarction, unspecified site, initial episode of care   . Acute osteomyelitis   . Allergic rhinitis   . Anemia   . Anginal pain (HCC)    07/15/13- no chest pain in months"  . Anxiety   . Bipolar affective (HCC)   . CAD (coronary artery disease) 12/12   s/p DES mid and distal RCA with 50% LAD  . Daily headache    not daily  . Depression    Bipolar disorder  . Diabetic retinopathy   . Esophageal stenosis   . Esophageal ulcer   . Esophagitis   . Gastroparesis   . GERD (gastroesophageal reflux disease)   . Heart murmur   . History of stomach ulcers   . Hyperlipidemia   . Hypertension   . Hypertension   . 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   [name]/notes on 12/19/2012, "that's the only one I've had" (12/19/2012)  . Migraines   . Orthopnoea   . Pneumonia 2012  . Polysubstance abuse    Crack cocaine--none since 2008, MJ, ETOH:  clean of all since 2008  . Renal insufficiency   . Rheumatoid arthritis(714.0)   . Sebaceous cyst     . Stroke [facility](HCC) 2011   denies residual on 12/19/2012.  "Years ago"  . Type II diabetes mellitus (HCC)    Previously uncontrolled for many years with multiple complications.  2017 controlled    Patient Active Problem List   Diagnosis Date Noted  . Urinary, incontinence, stress female 10/14/2015  . Obesity (BMI 30-39.9) 06/21/2015  . Acute renal failure syndrome (HCC)   . DKA (diabetic ketoacidoses) (HCC) 02/21/2015  . Acute renal failure (HCC) 02/21/2015  . Acute UTI 02/21/2015  . CKD (chronic kidney disease), stage III 02/21/2015  . Chronic systolic congestive heart failure, NYHA class 1 (HCC) 02/21/2015  . History of NSTEMI w/ DES to cfx May 2014 10/02/2014  . 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 Hyperlipidemia   . Tobacco abuse 10/03/2013  . Diabetic gastroparesis (HCC) 01/16/2013  . Nausea vomiting and diarrhea 12/29/2012  . Coronary atherosclerosis 08/25/2010  . Dehydration 06/17/2010  . Anemia 03/21/2010  . ALLERGIC RHINITIS 12/28/2009  . HTN (hypertension) 10/08/2009  . Rheumatoid arthritis (HCC) 02/18/2009  . Diabetic retinopathy (HCC) 08/13/2007  . ACUTE OSTEOMYELITIS, ANKLE AND FOOT 06/25/2007  . SUBSTANCE ABUSE, MULTIPLE 03/27/2007  . GERD 03/27/2007  . RENAL INSUFFICIENCY, CHRONIC 03/27/2007  . ANXIETY 03/25/2007  . DEPRESSION 03/25/2007  . ULCER, ESOPHAGUS WITHOUT BLEEDING 07/06/2006  . ESOPHAGEAL STENOSIS 07/06/2006  . Diabetes mellitus with multiple complications (HCC)  06/21/2006    Past Surgical History:  Procedure Laterality Date  . CORONARY ANGIOPLASTY WITH STENT PLACEMENT  01/20/2009   "2" (12/19/2012)  . CORONARY ANGIOPLASTY WITH STENT PLACEMENT  2012   "2" (12/19/2012)  . CORONARY ANGIOPLASTY WITH STENT PLACEMENT  12/19/2012   "2" (12/19/2012)  . ESOPHAGOGASTRODUODENOSCOPY N/A 02/26/2015   Procedure: ESOPHAGOGASTRODUODENOSCOPY (EGD);  Surgeon: Dorena Cookey, MD;  Location: [facility] ENDOSCOPY;  Service: Endoscopy;  Laterality: N/A;  . EYE SURGERY     Multiple  surgeries of both eyes:  last laser was 09/08/2015 of right eye.  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 GAS INSERTION Left 07/16/2013   Procedure: INSERTION OF GAS;  Surgeon: Shade Flood, MD;  Location: [facility] OR;  Service: Ophthalmology;  Laterality: Left;  SF6  . GAS/FLUID EXCHANGE Left 07/30/2013   Procedure: GAS/FLUID EXCHANGE;  Surgeon: Shade Flood, MD;  Location: [facility] OR;  Service: Ophthalmology;  Laterality: Left;  . IRRIGATION AND DEBRIDEMENT SEBACEOUS CYST Right 03/2011   "pointer" (12/19/2012)  . LEFT HEART CATHETERIZATION WITH CORONARY ANGIOGRAM N/A 07/06/2011   Procedure: LEFT HEART CATHETERIZATION WITH CORONARY ANGIOGRAM;  Surgeon: Corky Crafts, MD;  Location: [facility] CATH LAB;  Service: Cardiovascular;  Laterality: N/A;  possible PCI  . LEFT HEART CATHETERIZATION WITH CORONARY ANGIOGRAM N/A 12/19/2012   Procedure: LEFT HEART CATHETERIZATION WITH CORONARY ANGIOGRAM;  Surgeon: Corky Crafts, MD;  Location: [facility] CATH LAB;  Service: Cardiovascular;  Laterality: N/A;  . MEMBRANE PEEL Left 07/16/2013   Procedure: MEMBRANE PEEL;  Surgeon: Shade Flood, MD;  Location: [facility] OR;  Service: Ophthalmology;  Laterality: Left;  . PARS PLANA VITRECTOMY Left 07/16/2013   Procedure: PARS PLANA VITRECTOMY WITH 23 GAUGE;  Surgeon: Shade Flood, MD;  Location: [facility] OR;  Service: Ophthalmology;  Laterality: Left;  . PARS PLANA VITRECTOMY Left 07/30/2013   Procedure: PARS PLANA VITRECTOMY WITH 23 GAUGE WITH ENDOLASER;  Surgeon: Shade Flood, MD;  Location: [facility] OR;  Service: Ophthalmology;  Laterality: Left;  with endolaser  . PERCUTANEOUS CORONARY STENT INTERVENTION (PCI-S) N/A 07/06/2011   Procedure: PERCUTANEOUS CORONARY STENT INTERVENTION (PCI-S);  Surgeon: Corky Crafts, MD;  Location: [facility] CATH LAB;  Service: Cardiovascular;  Laterality: N/A;  . PHOTOCOAGULATION WITH LASER Left 07/16/2013   Procedure: PHOTOCOAGULATION WITH LASER;  Surgeon: Shade Flood, MD;  Location: [facility] OR;   Service: Ophthalmology;  Laterality: Left;  ENDOLASER    OB History    No data available       Home Medications    Prior to Admission medications   Medication Sig Start Date End Date Taking? Authorizing Provider  acetaminophen (TYLENOL) 500 MG tablet Take 500-1,000 mg by mouth every 6 (six) hours as needed for moderate pain.    Yes Historical Provider, MD  atorvastatin (LIPITOR) 40 MG tablet 2 tabs by mouth once daily with evening meal Patient taking differently: Take 80 mg by mouth every morning.  08/03/15  Yes Julieanne Manson, MD  atropine 1 % ophthalmic solution Place 1 drop into the left eye at bedtime. 04/12/16  Yes Historical Provider, MD  ferrous sulfate 325 (65 FE) MG tablet Take 1 tablet (325 mg total) by mouth daily with breakfast. 01/25/16  Yes Julieanne Manson, MD  folic acid (FOLVITE) 1 MG tablet Take 1 mg by mouth daily.   Yes Historical Provider, MD  furosemide (LASIX) 40 MG tablet Take 1 tablet (40 mg total) by mouth daily. In the morning 08/03/15  Yes Julieanne Manson, MD  insulin aspart (NOVOLOG) 100 UNIT/ML  injection Inject 4-16 Units into the skin 3 (three) times daily with meals. Sliding scale CBG 100-150; 4 units, 151-200; 6 units, 201-250; 8 units, 251-300; 10 units, 301-350; 12 units, 351-400; 14 units, > 401; call doctor   Yes Historical Provider, MD  insulin glargine (LANTUS) 100 UNIT/ML injection Inject 16 Units into the skin 2 (two) times daily.    Yes Historical Provider, MD  methotrexate (RHEUMATREX) 2.5 MG tablet Take 10 mg by mouth 2 (two) times a week. Caution:Chemotherapy. Protect from light.   Yes Historical Provider, MD  metoCLOPramide (REGLAN) 10 MG tablet Take 0.5 tablets (5 mg total) by mouth 4 (four) times daily -  before meals and at bedtime. Patient taking differently: Take 10 mg by mouth 2 (two) times daily.  08/03/15  Yes Julieanne Manson, MD  metoprolol tartrate (LOPRESSOR) 25 MG tablet Take 1 tablet (25 mg total) by mouth 2 (two) times daily.  08/03/15  Yes Julieanne Manson, MD  pantoprazole (PROTONIX) 40 MG tablet Take 1 tablet (40 mg total) by mouth 2 (two) times daily. 08/03/15  Yes Julieanne Manson, MD  sertraline (ZOLOFT) 100 MG tablet 1 tab by mouth daily Patient taking differently: Take 100 mg by mouth daily.  02/14/16  Yes Julieanne Manson, MD  traMADol (ULTRAM) 50 MG tablet Take 1 tablet (50 mg total) by mouth every 6 (six) hours as needed. Patient taking differently: Take 50 mg by mouth every 6 (six) hours as needed for moderate pain.  03/24/16  Yes Julieanne Manson, MD  Incontinence Supply Disposable (POISE ULTRA THINS) PADS Use 5 pads daily as needed for urinary stress incontinence 01/03/16   Julieanne Manson, MD  nitroGLYCERIN (NITROSTAT) 0.4 MG SL tablet Place 1 tablet (0.4 mg total) under the tongue every 5 (five) minutes as needed for chest pain. 09/20/15   Julieanne Manson, MD    Family History Family History  Problem Relation Age of Onset  . Breast cancer Mother 70  . Hypertension Father   . Stomach cancer Sister   . Hypertension Sister   . Cancer Sister     unknown origin  . Colon cancer Neg Hx     Social History Social History  Substance Use Topics  . Smoking status: Current Every Day Smoker    Packs/day: 0.50    Years: 22.00    Types: Cigarettes  . Smokeless tobacco: Never Used     Comment: Has been to classes--not sure if she wants to quit.  Changing to non menthol.  . Alcohol use 0.0 oz/week     Comment: socially     Allergies   Aspirin   Review of Systems Review of Systems  All other systems reviewed and are negative.    Physical Exam Updated Vital Signs BP (!) 157/101   Pulse 94   Temp 97.6 F (36.4 C) (Oral)   Resp 19   SpO2 99%   Physical Exam  Constitutional: She is oriented to person, place, and time. She appears well-developed and well-nourished.  HENT:  Head: Normocephalic and atraumatic.  Eyes: Conjunctivae are normal. Pupils are equal, round, and reactive to  light. Right eye exhibits no discharge. Left eye exhibits no discharge. No scleral icterus.  Neck: Normal range of motion. No JVD present. No tracheal deviation present.  Pulmonary/Chest: Effort normal. No stridor.  Abdominal: Soft. There is tenderness.  Neurological: She is alert and oriented to person, place, and time. Coordination normal.  Psychiatric: She has a normal mood and affect. Her behavior is normal. Judgment and  thought content normal.  Nursing note and vitals reviewed.    ED Treatments / Results  Labs (all labs ordered are listed, but only abnormal results are displayed) Labs Reviewed  CBC WITH DIFFERENTIAL/PLATELET - Abnormal; Notable for the following:       Result Value   WBC 13.3 (*)    RDW 17.7 (*)    Neutro Abs 11.1 (*)    All other components within normal limits  COMPREHENSIVE METABOLIC PANEL - Abnormal; Notable for the following:    CO2 17 (*)    Glucose, Bld 148 (*)    BUN 24 (*)    Creatinine, Ser 1.69 (*)    Total Protein 8.5 (*)    ALT 10 (*)    Alkaline Phosphatase 137 (*)    GFR calc non Af Amer 34 (*)    GFR calc Af Amer 40 (*)    All other components within normal limits  CBG MONITORING, ED - Abnormal; Notable for the following:    Glucose-Capillary 193 (*)    All other components within normal limits  I-STAT CHEM 8, ED - Abnormal; Notable for the following:    Sodium 147 (*)    Potassium 2.6 (*)    Chloride 118 (*)    Glucose, Bld 112 (*)    Calcium, Ion 0.95 (*)    Hemoglobin 9.9 (*)    HCT 29.0 (*)    All other components within normal limits  I-STAT TROPOININ, ED    EKG  EKG Interpretation None       Radiology Dg Chest 2 View  Result Date: 04/20/2016 CLINICAL DATA:  Chest abdominal pain EXAM: CHEST  2 VIEW COMPARISON:  07/31/2013 FINDINGS: Normal heart size and mediastinal contours. RCA stenting. There is no edema, consolidation, effusion, or pneumothorax. No acute osseous finding. IMPRESSION: No evidence of acute disease.  Electronically Signed   By: Marnee Spring M.D.   On: 04/20/2016 16:46   US Abdomen Complete  Result Date: 04/20/2016 CLINICAL DATA:  Recurring diffuse abdominal pain EXAM: ABDOMEN ULTRASOUND COMPLETE COMPARISON:  Ultrasound 03/13/2016 FINDINGS: Gallbladder: No gallstones or wall thickening visualized. No sonographic Murphy sign noted by sonographer. Common bile duct: Diameter: 7 mm in diameter, upper limits normal Liver: No focal lesion identified. Within normal limits in parenchymal echogenicity. IVC: No abnormality visualized. Pancreas: Visualized portion unremarkable. Spleen: Size and appearance within normal limits. Right Kidney: Length: 10.2 cm. Echogenicity within normal limits. No mass or hydronephrosis visualized. Left Kidney: Length: 9.0 cm. Echogenicity within normal limits. No mass or hydronephrosis visualized. Abdominal aorta: No aneurysm visualized. Other findings: None. IMPRESSION: Borderline diameter of the common bile duct is 7 mm. There appears to be debris within the common bile duct as noted on prior study. Recommend correlation with LFTs. This could be further evaluated with MRCP if felt clinically indicated. Electronically Signed   By: Charlett Nose M.D.   On: 04/20/2016 16:00    Procedures Procedures (including critical care time)  Medications Ordered in ED Medications  lidocaine (PF) (XYLOCAINE) 1 % injection 5 mL (5 mLs Other Given by Other 04/20/16 1300)  ondansetron (ZOFRAN-ODT) disintegrating tablet 4 mg (4 mg Oral Given 04/20/16 1348)     Initial Impression / Assessment and Plan / ED Course  I have reviewed the triage vital signs and the nursing notes.  Pertinent labs & imaging results that were available during my care of the patient were reviewed by me and considered in my medical decision making (see chart for details).  Clinical Course      Final Clinical Impressions(s) / ED Diagnoses   Final diagnoses:  Gastroparesis    50 year old female presents  today with likely gastroparesis. Patient was significantly stressed at the time of initial evaluation, the tourniquet underarm caused significant pain. Difficulty with IV access, eventually IV access was accomplished. Throughout patient's stay in the ED she had significant improvement in both her anxiety and pain. She's feeling much better after antinausea medication. Patient's abdomen soft nontender afebrile, reassuring laboratory analysis. Patient eating and drinking here in the ED without difficulty. Patient requesting discharge home, I felt this recent illness patient tolerating by mouth. Initial i-STAT lactic acid showed hypokalemia, formal CMP showed 4.1. Patient encouraged follow-up with primary care and gastroenterology for reevaluation further management. Stricter precautions given. She verbalizes understanding and agreement to today's plan had no further questions or concerns  New Prescriptions Discharge Medication List as of 04/20/2016  6:28 PM       Eyvonne Mechanic, PA-C 04/21/16 1701    Doug Sou, MD 04/21/16 1708

## 2016-04-20 NOTE — ED Notes (Signed)
Pt's BP manually measured on left upper arm.  100/60.

## 2016-04-20 NOTE — ED Notes (Signed)
Pt refused to have last set of vitals done.

## 2016-04-20 NOTE — Discharge Instructions (Signed)
Please follow-up with your primary care provider and gastroenterologist for reevaluation and further management. Please return to the emergency room immediately if you experience any new or worsening signs or symptoms

## 2016-04-20 NOTE — ED Provider Notes (Signed)
Complains of midsternal chest pain and right upper quadrant pain onset last night. Pain is not affected by eating. No nausea or vomiting. No shortness of breath. Pain is improved by walking. She gets similar pain approximately once per week for the past several months on exam alert anxious appearing lungs clear auscultation abdomen obese, nontender extremities without edema heart regular rate and rhythm Procedure. I attempted per referral IV access at both left and right external jugular veins after skin prep with chlorhexidine and numbed locally with 1% lidocaine with epinephrine, without success. No resultant hematoma or complication.   Doug Sou, MD 04/20/16 [phone]

## 2016-04-20 NOTE — ED Notes (Signed)
Pt ambulatory to restroom

## 2016-04-20 NOTE — ED Notes (Signed)
IV team at bedside 

## 2016-04-20 NOTE — ED Notes (Signed)
Pt refuses to allow anyone to attempt IV access or blood draw in arms. Pt sts she has to be stuck in foot. Burna Forts, PA at bedside and sts to attempt in foot. 1 attempt by self in right foot and 1 attempt by Warden Fillers, RN in left foot. MD jacubowitz attempted x 3 for EJ without success. PA Hedges attempted x 1 with ultrasound in right foot with no success. IV team consulted.

## 2016-05-02 ENCOUNTER — Telehealth: Payer: Self-pay | Admitting: Internal Medicine

## 2016-05-02 NOTE — Telephone Encounter (Signed)
Pat received a call from [facility] requesting referral notes for patient to be sent to them regarding Ophthalmology referral.  Dennie Bible sent notes as requested to fax number [phone].  Phone number for Vernona Rieger at Providers office is [phone].

## 2016-05-10 ENCOUNTER — Ambulatory Visit: Payer: Medicaid Other | Admitting: Internal Medicine

## 2016-05-19 IMAGING — MG MM DIGITAL SCREENING BILAT
4 series · 4 of 4 positions shown · non-contrast
Comparison: Previous exam(s).

CLINICAL DATA: Screening.

EXAM:
DIGITAL SCREENING BILATERAL MAMMOGRAM WITH CAD

[R CC]
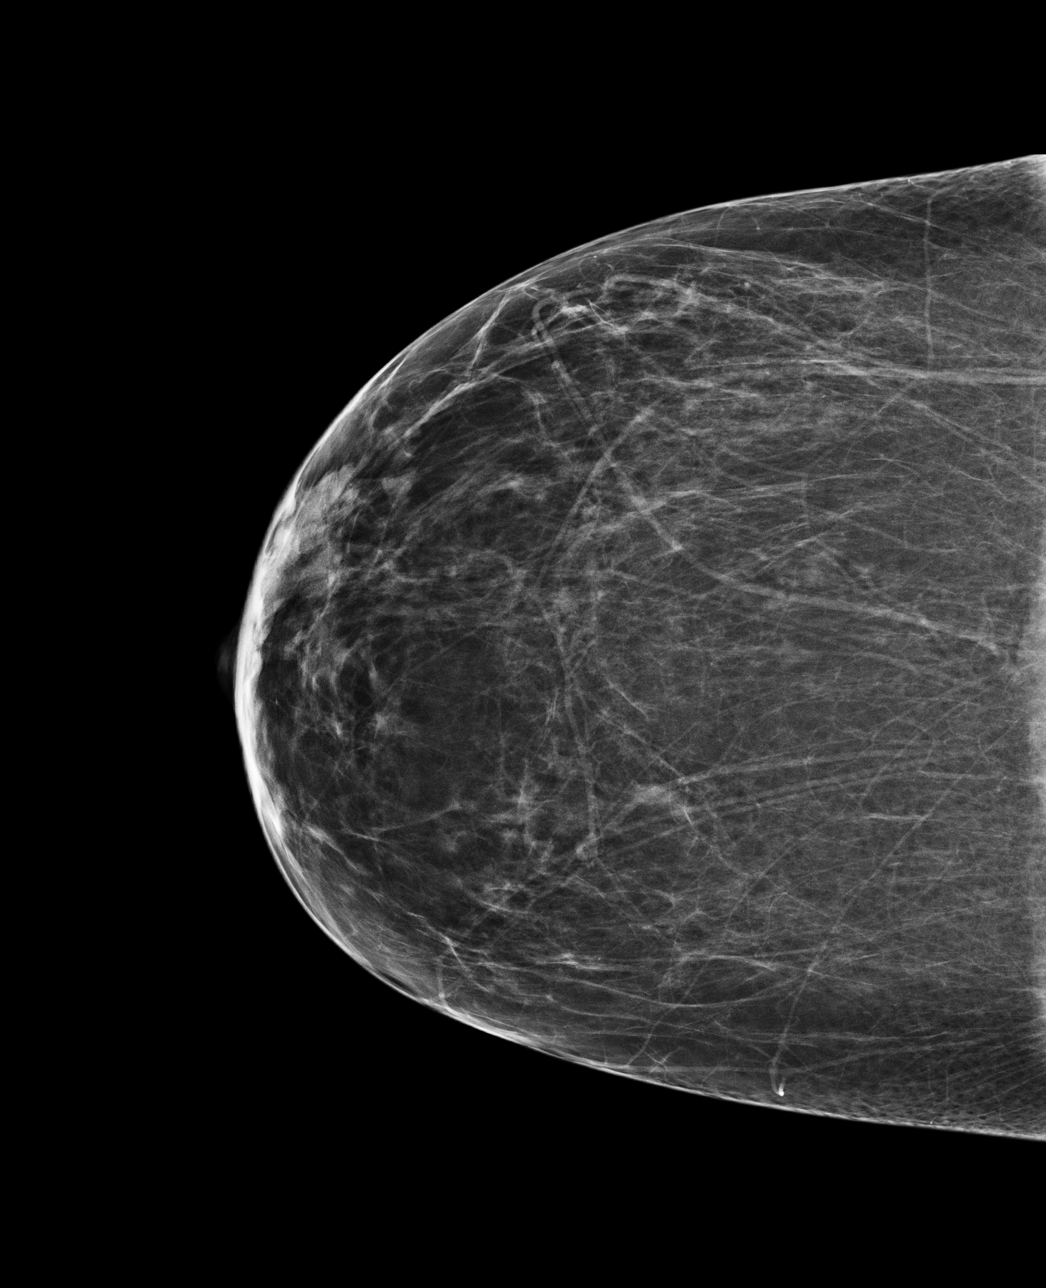

[L MLO]
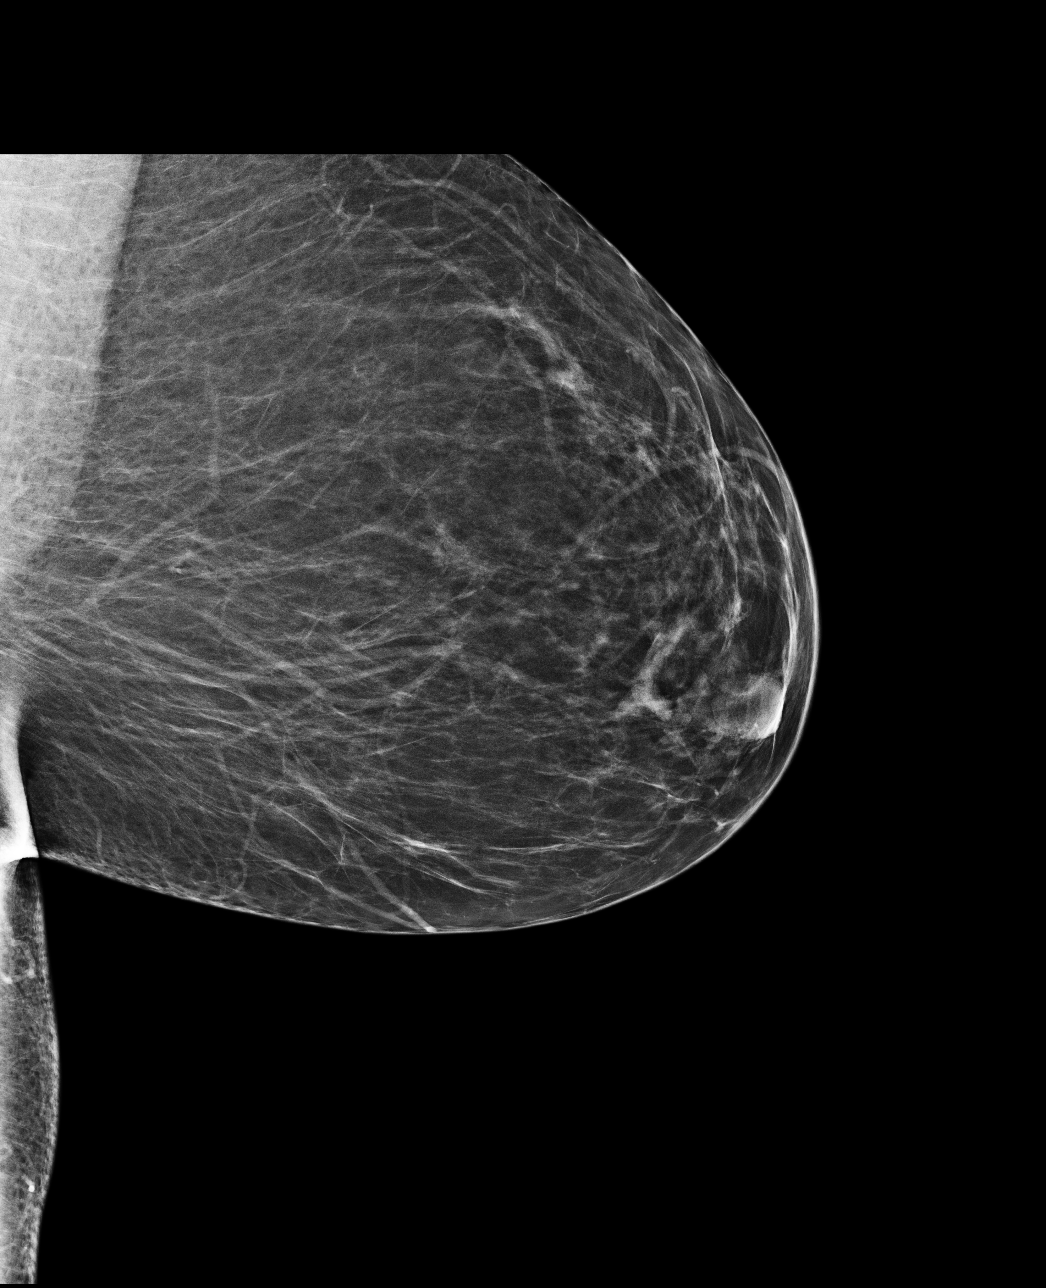

[L CC]
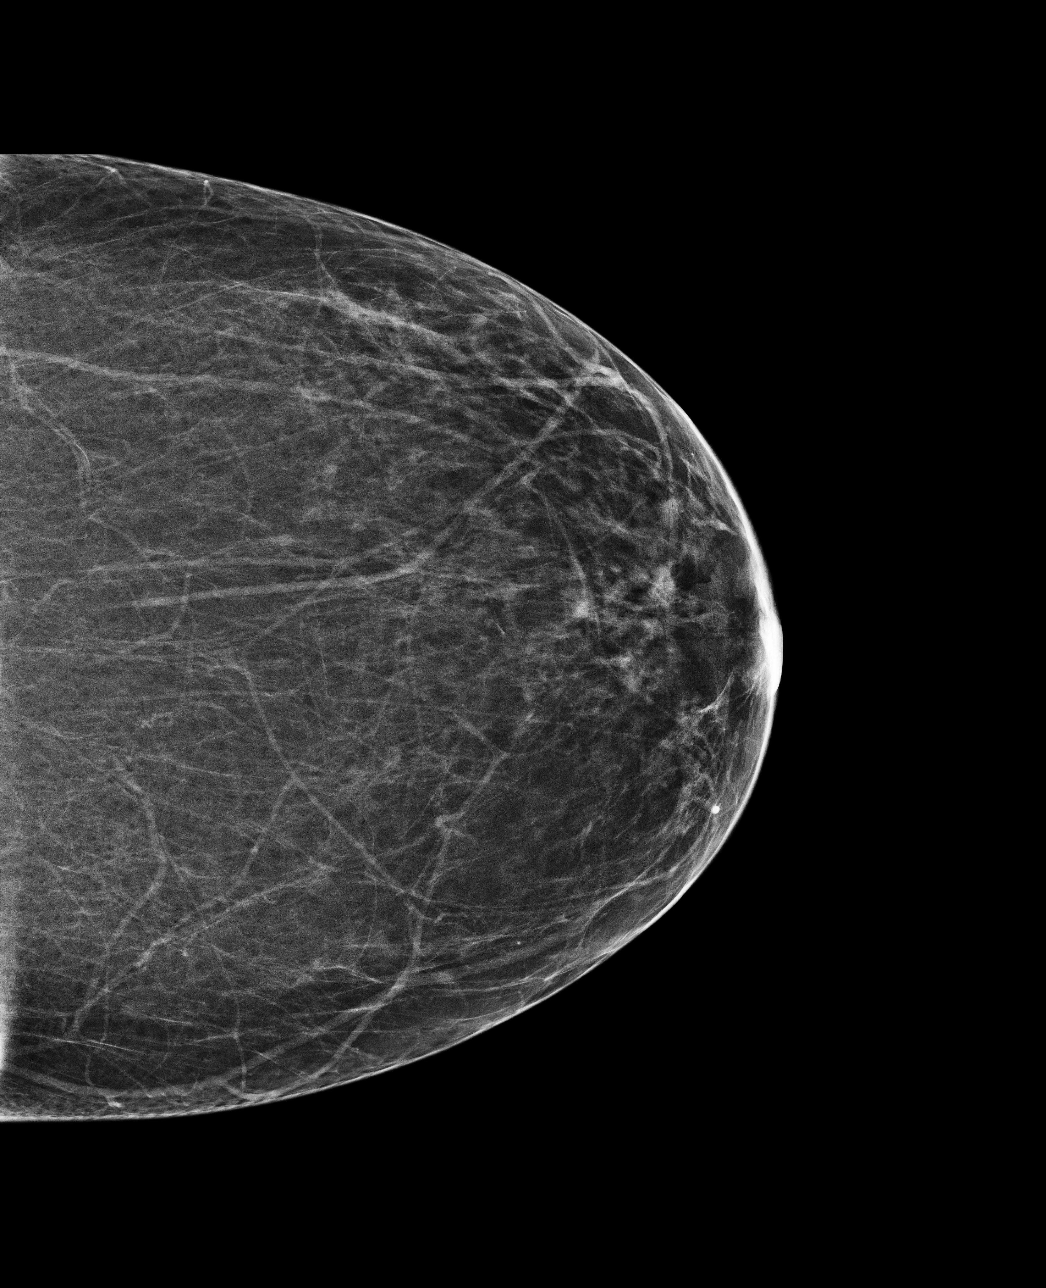

[R MLO]
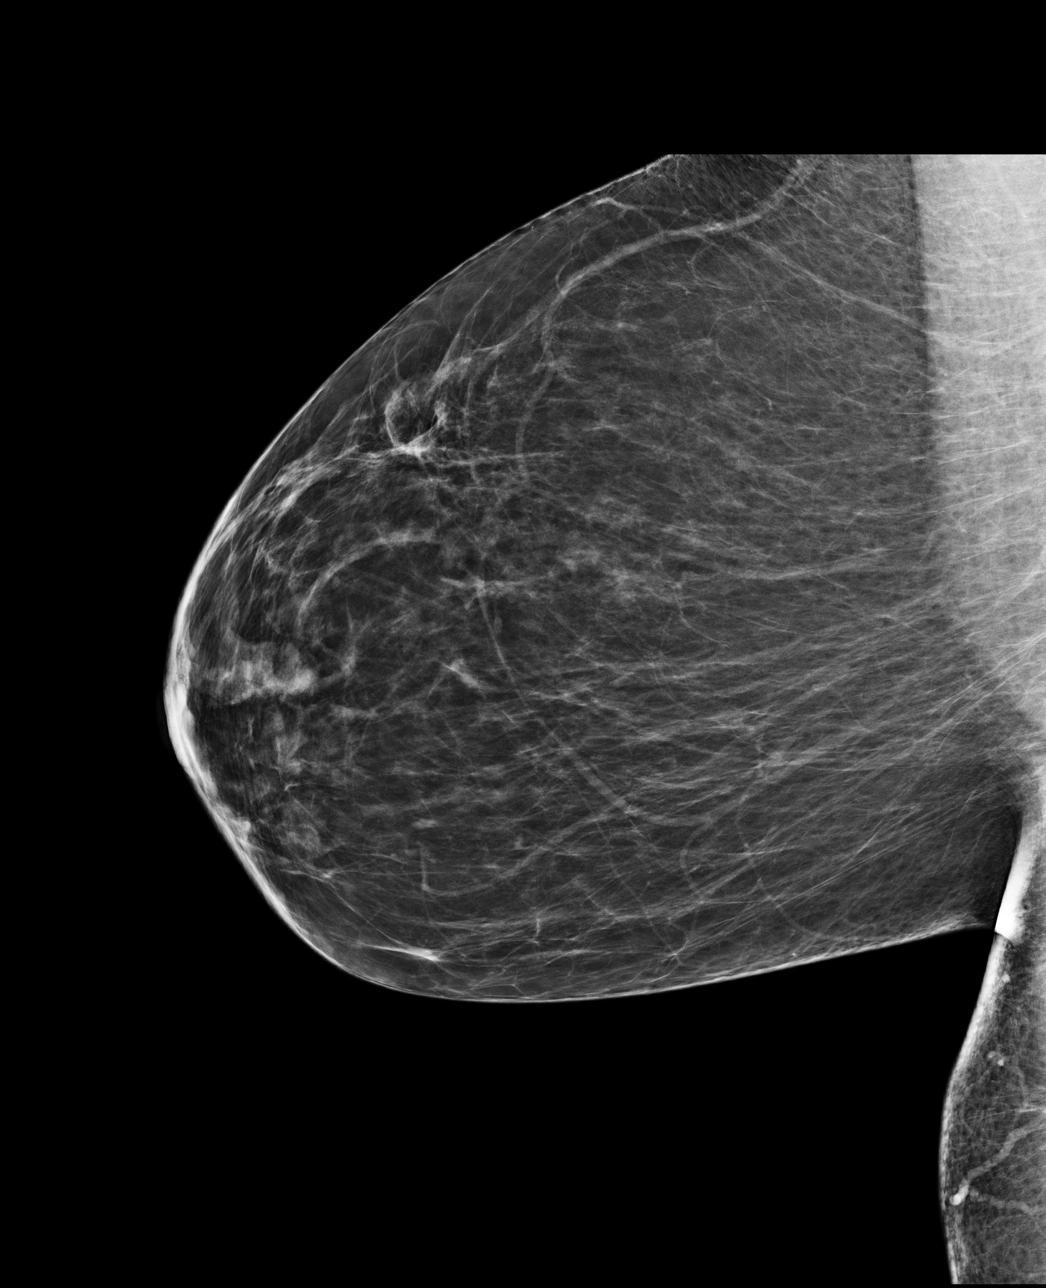

[4 of 4 positions shown; findings below may reference images not displayed]

ACR Breast Density Category b: There are scattered areas of
fibroglandular density.
FINDINGS: There are no findings suspicious for malignancy. Images were
processed with CAD.
IMPRESSION: No mammographic evidence of malignancy. A result letter of this
screening mammogram will be mailed directly to the patient.

RECOMMENDATION:
Screening mammogram in one year. (Code:AS-G-LCT)

BI-RADS CATEGORY  1: Negative.

## 2016-05-25 ENCOUNTER — Emergency Department (HOSPITAL_COMMUNITY): Payer: Medicaid Other

## 2016-05-25 ENCOUNTER — Emergency Department (HOSPITAL_COMMUNITY)
Admission: EM | Admit: 2016-05-25 | Discharge: 2016-05-25 | Disposition: A | Discharge status: Home / Self Care | Payer: Medicaid Other | Attending: Emergency Medicine | Admitting: Emergency Medicine

## 2016-05-25 ENCOUNTER — Encounter (HOSPITAL_COMMUNITY): Payer: Self-pay

## 2016-05-25 DIAGNOSIS — I252 Old myocardial infarction: Secondary | ICD-10-CM | POA: Insufficient documentation

## 2016-05-25 DIAGNOSIS — Y939 Activity, unspecified: Secondary | ICD-10-CM | POA: Insufficient documentation

## 2016-05-25 DIAGNOSIS — Z794 Long term (current) use of insulin: Secondary | ICD-10-CM | POA: Insufficient documentation

## 2016-05-25 DIAGNOSIS — I5022 Chronic systolic (congestive) heart failure: Secondary | ICD-10-CM | POA: Insufficient documentation

## 2016-05-25 DIAGNOSIS — Z955 Presence of coronary angioplasty implant and graft: Secondary | ICD-10-CM | POA: Insufficient documentation

## 2016-05-25 DIAGNOSIS — N183 Chronic kidney disease, stage 3 (moderate): Secondary | ICD-10-CM | POA: Diagnosis not present

## 2016-05-25 DIAGNOSIS — E11319 Type 2 diabetes mellitus with unspecified diabetic retinopathy without macular edema: Secondary | ICD-10-CM | POA: Insufficient documentation

## 2016-05-25 DIAGNOSIS — F17210 Nicotine dependence, cigarettes, uncomplicated: Secondary | ICD-10-CM | POA: Diagnosis not present

## 2016-05-25 DIAGNOSIS — I130 Hypertensive heart and chronic kidney disease with heart failure and stage 1 through stage 4 chronic kidney disease, or unspecified chronic kidney disease: Secondary | ICD-10-CM | POA: Insufficient documentation

## 2016-05-25 DIAGNOSIS — I2510 Atherosclerotic heart disease of native coronary artery without angina pectoris: Secondary | ICD-10-CM | POA: Insufficient documentation

## 2016-05-25 DIAGNOSIS — M542 Cervicalgia: Secondary | ICD-10-CM | POA: Insufficient documentation

## 2016-05-25 DIAGNOSIS — E119 Type 2 diabetes mellitus without complications: Secondary | ICD-10-CM | POA: Diagnosis not present

## 2016-05-25 DIAGNOSIS — Z8673 Personal history of transient ischemic attack (TIA), and cerebral infarction without residual deficits: Secondary | ICD-10-CM | POA: Diagnosis not present

## 2016-05-25 DIAGNOSIS — Y999 Unspecified external cause status: Secondary | ICD-10-CM | POA: Insufficient documentation

## 2016-05-25 DIAGNOSIS — Y92410 Unspecified street and highway as the place of occurrence of the external cause: Secondary | ICD-10-CM | POA: Insufficient documentation

## 2016-05-25 LAB — CBG MONITORING, ED: GLUCOSE-CAPILLARY: 67 mg/dL (ref 65–99)

## 2016-05-25 MED ORDER — DEXAMETHASONE SODIUM PHOSPHATE 10 MG/ML IJ SOLN
10.0000 mg | Freq: Once | INTRAMUSCULAR | Status: AC
  Administered 2016-05-25: 10 mg via INTRAMUSCULAR
  Filled 2016-05-25: qty 1
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
## 2016-05-25 NOTE — Discharge Instructions (Signed)
Return here for any trouble breathing or any other problems.
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
## 2016-05-25 NOTE — ED Notes (Signed)
Pt ambulated to bathroom unassisted and in nad.

## 2016-06-16 ENCOUNTER — Encounter: Payer: Self-pay | Admitting: Internal Medicine

## 2016-06-16 ENCOUNTER — Ambulatory Visit (INDEPENDENT_AMBULATORY_CARE_PROVIDER_SITE_OTHER): Payer: Medicaid Other | Admitting: Internal Medicine

## 2016-06-16 VITALS — BP 126/82 | HR 84 | Resp 14 | Ht 60.5 in | Wt 181.0 lb

## 2016-06-16 DIAGNOSIS — F4320 Adjustment disorder, unspecified: Secondary | ICD-10-CM | POA: Diagnosis not present

## 2016-06-16 DIAGNOSIS — A6004 Herpesviral vulvovaginitis: Secondary | ICD-10-CM | POA: Diagnosis not present

## 2016-06-16 DIAGNOSIS — Z720 Tobacco use: Secondary | ICD-10-CM

## 2016-06-16 DIAGNOSIS — Z0000 Encounter for general adult medical examination without abnormal findings: Secondary | ICD-10-CM | POA: Diagnosis not present

## 2016-06-16 DIAGNOSIS — M0579 Rheumatoid arthritis with rheumatoid factor of multiple sites without organ or systems involvement: Secondary | ICD-10-CM | POA: Diagnosis not present

## 2016-06-16 DIAGNOSIS — F4321 Adjustment disorder with depressed mood: Secondary | ICD-10-CM

## 2016-06-16 DIAGNOSIS — E118 Type 2 diabetes mellitus with unspecified complications: Secondary | ICD-10-CM | POA: Diagnosis not present

## 2016-06-16 LAB — GLUCOSE, POCT (MANUAL RESULT ENTRY): POC Glucose: 170 mg/dl — AB (ref 70–99)

## 2016-06-16 MED ORDER — ACYCLOVIR 400 MG PO TABS: 400.0000 mg | ORAL_TABLET | Freq: Three times a day (TID) | ORAL | 0 refills | Status: DC

## 2016-06-16 NOTE — Progress Notes (Signed)
Subjective:    Patient ID: [name] [name], female    DOB: [birth year], 50 y.o.   MRN: [id number]  HPI   Sister now in Hospice for lung cancer  CPE without pap:  Patient usually goes to [facility], but would prefer to go ahead and have this done here.  Discussed unable to perform pap and pelvic today as provider with splint on one wrist.  1.  Pap: 02/2013, always normal, though possible history of rare herpes lesion  2.  Mammogram:  Due in March 2018.  Have been normal  3.  Osteoprevention:  Cheese once daily.  No vitamin D or calcium.  No history of DEXA.  LMP June 2016.  Walks regularly 4 times weekly for half mile.  4.  Guaiac Cards:  Last check 2012.  Negative for blood.    5.  Colonoscopy:   No history of colonoscopy.  6.  Immunizations:   Immunization History  Administered Date(s) Administered  . H1N1 07/21/2008  . Hepatitis B 01/21/2015, 08/05/2015  . Influenza Whole 05/02/2006, 07/21/2008, 05/04/2009, 04/28/2010  . Influenza-Unspecified 08/01/2015, 05/30/2016  . Pneumococcal Conjugate-13 11/09/2015  . Pneumococcal Polysaccharide-23 10/31/2005  . Td 06/02/2004  . Tdap 11/09/2015    Current Meds  Medication Sig  . acetaminophen (TYLENOL) 500 MG tablet Take 500-1,000 mg by mouth every 6 (six) hours as needed for moderate pain.   Marland Kitchen atorvastatin (LIPITOR) 40 MG tablet 2 tabs by mouth once daily with evening meal (Patient taking differently: Take 80 mg by mouth every morning. )  . atropine 1 % ophthalmic solution Place 1 drop into the left eye at bedtime.  . ferrous sulfate 325 (65 FE) MG tablet Take 1 tablet (325 mg total) by mouth daily with breakfast.  . folic acid (FOLVITE) 1 MG tablet Take 1 mg by mouth daily.  . furosemide (LASIX) 40 MG tablet Take 1 tablet (40 mg total) by mouth daily. In the morning  . Incontinence Supply Disposable (POISE ULTRA THINS) PADS Use 5 pads daily as needed for urinary stress incontinence  . insulin aspart (NOVOLOG) 100 UNIT/ML injection  Inject 4-16 Units into the skin 3 (three) times daily with meals. Sliding scale CBG 100-150; 4 units, 151-200; 6 units, 201-250; 8 units, 251-300; 10 units, 301-350; 12 units, 351-400; 14 units, > 401; call doctor  . insulin glargine (LANTUS) 100 UNIT/ML injection Inject 16 Units into the skin 2 (two) times daily.   . methotrexate (RHEUMATREX) 2.5 MG tablet Take 10 mg by mouth 2 (two) times a week. Caution:Chemotherapy. Protect from light.  . metoCLOPramide (REGLAN) 10 MG tablet Take 0.5 tablets (5 mg total) by mouth 4 (four) times daily -  before meals and at bedtime. (Patient taking differently: Take 10 mg by mouth 2 (two) times daily. )  . metoprolol tartrate (LOPRESSOR) 25 MG tablet Take 1 tablet (25 mg total) by mouth 2 (two) times daily.  . nitroGLYCERIN (NITROSTAT) 0.4 MG SL tablet Place 1 tablet (0.4 mg total) under the tongue every 5 (five) minutes as needed for chest pain.  . pantoprazole (PROTONIX) 40 MG tablet Take 1 tablet (40 mg total) by mouth 2 (two) times daily.  . sertraline (ZOLOFT) 100 MG tablet 1 tab by mouth daily (Patient taking differently: Take 100 mg by mouth daily. )  . traMADol (ULTRAM) 50 MG tablet Take 1 tablet (50 mg total) by mouth every 6 (six) hours as needed. (Patient taking differently: Take 50 mg by mouth every 6 (six) hours as needed for  moderate pain. )    Allergies  Allergen Reactions  . Aspirin Other (See Comments)    Stomach bleeds     Past Medical History:  Diagnosis Date  . Acute myocardial infarction of other lateral wall, initial episode of care   . Acute myocardial infarction, unspecified site, initial episode of care   . Acute osteomyelitis   . Allergic rhinitis   . Anemia   . Anginal pain (HCC)    07/15/13- no chest pain in months"  . Anxiety   . Bipolar affective (HCC)   . CAD (coronary artery disease) 12/12   s/p DES mid and distal RCA with 50% LAD  . Daily headache    not daily  . Depression    Bipolar disorder  . Diabetic  retinopathy   . Esophageal stenosis   . Esophageal ulcer   . Esophagitis   . Gastroparesis   . GERD (gastroesophageal reflux disease)   . Heart murmur   . History of stomach ulcers   . Hyperlipidemia   . Hypertension   . Hypertension   . 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 Migraines   . Orthopnoea   . Pneumonia 2012  . Polysubstance abuse    Crack cocaine--none since 2008, MJ, ETOH:  clean of all since 2008  . Renal insufficiency   . Rheumatoid arthritis(714.0)   . Sebaceous cyst   . Stroke [facility]) 2011   denies residual on 12/19/2012.  "Years ago"  . Type II diabetes mellitus (HCC)    Previously uncontrolled for many years with multiple complications.  2017 controlled. 05/2016:  6.7%    Past Surgical History:  Procedure Laterality Date  . CORONARY ANGIOPLASTY WITH STENT PLACEMENT  01/20/2009   "2" (12/19/2012)  . CORONARY ANGIOPLASTY WITH STENT PLACEMENT  2012   "2" (12/19/2012)  . CORONARY ANGIOPLASTY WITH STENT PLACEMENT  12/19/2012   "2" (12/19/2012)  . ESOPHAGOGASTRODUODENOSCOPY N/A 02/26/2015   Procedure: ESOPHAGOGASTRODUODENOSCOPY (EGD);  Surgeon: Dorena Cookey, MD;  Location: [facility] ENDOSCOPY;  Service: Endoscopy;  Laterality: N/A;  . EYE SURGERY     Multiple surgeries of both eyes:  last laser was 09/08/2015 of right eye.  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 GAS INSERTION Left 07/16/2013   Procedure: INSERTION OF GAS;  Surgeon: Shade Flood, MD;  Location: [facility] OR;  Service: Ophthalmology;  Laterality: Left;  SF6  . GAS/FLUID EXCHANGE Left 07/30/2013   Procedure: GAS/FLUID EXCHANGE;  Surgeon: Shade Flood, MD;  Location: [facility] OR;  Service: Ophthalmology;  Laterality: Left;  . IRRIGATION AND DEBRIDEMENT SEBACEOUS CYST Right 03/2011   "pointer" (12/19/2012)  . LEFT HEART CATHETERIZATION WITH CORONARY ANGIOGRAM N/A 07/06/2011   Procedure: LEFT HEART CATHETERIZATION WITH CORONARY ANGIOGRAM;  Surgeon:  Corky Crafts, MD;  Location: [facility] CATH LAB;  Service: Cardiovascular;  Laterality: N/A;  possible PCI  . LEFT HEART CATHETERIZATION WITH CORONARY ANGIOGRAM N/A 12/19/2012   Procedure: LEFT HEART CATHETERIZATION WITH CORONARY ANGIOGRAM;  Surgeon: Corky Crafts, MD;  Location: [facility] CATH LAB;  Service: Cardiovascular;  Laterality: N/A;  . MEMBRANE PEEL Left 07/16/2013   Procedure: MEMBRANE PEEL;  Surgeon: Shade Flood, MD;  Location: [facility] OR;  Service: Ophthalmology;  Laterality: Left;  . PARS PLANA VITRECTOMY Left 07/16/2013   Procedure: PARS PLANA VITRECTOMY WITH 23 GAUGE;  Surgeon: Shade Flood, MD;  Location: [facility] OR;  Service: Ophthalmology;  Laterality: Left;  . PARS PLANA VITRECTOMY Left  07/30/2013   Procedure: PARS PLANA VITRECTOMY WITH 23 GAUGE WITH ENDOLASER;  Surgeon: Shade Flood, MD;  Location: [facility] OR;  Service: Ophthalmology;  Laterality: Left;  with endolaser  . PERCUTANEOUS CORONARY STENT INTERVENTION (PCI-S) N/A 07/06/2011   Procedure: PERCUTANEOUS CORONARY STENT INTERVENTION (PCI-S);  Surgeon: Corky Crafts, MD;  Location: [facility] CATH LAB;  Service: Cardiovascular;  Laterality: N/A;  . PHOTOCOAGULATION WITH LASER Left 07/16/2013   Procedure: PHOTOCOAGULATION WITH LASER;  Surgeon: Shade Flood, MD;  Location: [facility] OR;  Service: Ophthalmology;  Laterality: Left;  ENDOLASER   Family History  Problem Relation Age of Onset  . Breast cancer Mother 77  . Hypertension Father   . Stomach cancer Sister   . Hypertension Sister   . Cancer Sister     lung cancer--just in hospice 05/2016  . Cancer Sister     ? stomach cancer  . Colon cancer Neg Hx    Social History   Social History  . Marital status: Divorced    Spouse name: Marcial Pacas  . Number of children: 1  . Years of education: 9   Occupational History  . Disabled     Social History Main Topics  . Smoking status: Current Every Day Smoker    Packs/day: 0.50    Years: 22.00    Types: Cigarettes  . Smokeless tobacco:  Never Used     Comment: Has been to classes--not sure if she wants to quit.  Changing to non menthol..  Chantix, patches, gum never helped.  . Alcohol use 0.0 oz/week     Comment: socially--holidays  . Drug use: No     Comment: 12/31/2012 " clean from crack and marijuana since 2008"  . Sexual activity: Yes    Partners: Male    Birth control/ protection: None   Other Topics Concern  . Not on file   Social History Narrative   Has lived in [place] for most of life   Disabled   Previously went to [employer] school and worked as Conservation officer, nature.   Lives by herself near [place].   Divorced in 2015.      Review of Systems  Constitutional: Negative for fatigue.       Not eating properly since June due to sister's cancer and care  HENT: Negative for ear pain and hearing loss.   Eyes: Positive for visual disturbance.       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   Cardiovascular: Negative for chest pain, palpitations and leg swelling.       Sleeps on 2 pillows for neck comfort No PND or orthopnea  Gastrointestinal: Positive for nausea. Negative for abdominal pain, blood in stool, constipation, diarrhea and vomiting.       No melena Does have diabetic gastroparesis  Genitourinary: Positive for dysuria. Negative for vaginal bleeding, vaginal discharge and vaginal pain.       Scratched her vaginal area and burns with urination for about one week  Neurological: Negative for dizziness, weakness and numbness.  Hematological: Negative for adenopathy. Bruises/bleeds easily.  Psychiatric/Behavioral: Negative for dysphoric mood and suicidal ideas. The patient is nervous/anxious.        Grieving her sister's move to hospice       Objective:   Physical Exam  Constitutional: She is oriented to person, place, and time. She appears well-developed and well-nourished.  HENT:  Head: Normocephalic and atraumatic.  Right Ear:  Hearing, tympanic membrane, external ear  and ear canal normal.  Left Ear: Hearing, tympanic membrane, external ear and ear canal normal.  Nose: Nose normal.  Mouth/Throat: Uvula is midline, oropharynx is clear and moist and mucous membranes are normal.  Eyes: Conjunctivae are normal. Right eye exhibits normal extraocular motion. Right pupil is round and reactive. Left pupil is not reactive.  Left eye atrophied with white scarring over cornea.  Left lid ptosis as well.  Neck: Normal range of motion. Neck supple. No thyroid mass and no thyromegaly present.  Cardiovascular: Normal rate, regular rhythm, S1 normal and S2 normal.  Exam reveals no S3, no S4 and no friction rub.   No murmur heard. No carotid bruits.  Carotid, radial, femoral, DP and PT pulses normal and equal.   Pulmonary/Chest: Effort normal and breath sounds normal. Right breast exhibits no inverted nipple, no mass, no nipple discharge, no skin change and no tenderness. Left breast exhibits no inverted nipple, no mass, no nipple discharge, no skin change and no tenderness.  Abdominal: Soft. Bowel sounds are normal. She exhibits no mass. There is no hepatosplenomegaly. There is no tenderness. No hernia.  Genitourinary:     Musculoskeletal: Normal range of motion.  Lymphadenopathy:       Head (right side): No submental and no submandibular adenopathy present.       Head (left side): No submental and no submandibular adenopathy present.    She has no cervical adenopathy.    She has no axillary adenopathy.       Right: No inguinal and no supraclavicular adenopathy present.       Left: No inguinal and no supraclavicular adenopathy present.  Neurological: She is alert and oriented to person, place, and time. She has normal strength and normal reflexes. No cranial nerve deficit or sensory deficit. Coordination and gait normal.  No CN deficits other than what noted previously with left eye--end organ disease however  Skin: Skin is warm  and dry.  Right medial 2nd toe with hyperpigmented sore, 49mm.  Covered in white cream prescribed by Endocrinologist's office.  Psychiatric: She has a normal mood and affect. Her speech is normal and behavior is normal. Judgment and thought content normal.       Assessment & Plan:  1.  CPE, to return for pap/pelvic/rectal when provider no longer with wrist splint. Return for pap pelvic, rectal Stool cards x 3, to return with completion of CPE  2.  Genital Herpes Outbreak:  Discussed needs to have conversation with boyfriend to protect him.  Can still pass on even when no active lesions, but more likely to then.  Acyclovir 400 mg 3 times daily for 5 days.  3.  Rheumatology:  Last seen by Dr. Kellie Simmering on Nov. 13th.  He is not moving to EHR and apparently will not be able to get reimbursed by Medicaid, so can no longer follow her.  Refer to [facility] Rheum  4.  Grief Reaction:  Referral to Samul Dada, LCSW  5.  Tobacco Abuse:  Discussed where to go for support services to quit. Plan to use Nicotine patches to quit.

## 2016-06-16 NOTE — Patient Instructions (Signed)
For nicotine patches:  Stop smoking anything the day you start the first patch Start with 21 mg patch and reapply new to different area of skin every 24 hours for 30 days. Then 14 mg patch changed every 24 hours for 14 days. Then 7 mg patch changed every 24 hours for 14 days.

## 2016-06-19 ENCOUNTER — Telehealth: Payer: Self-pay | Admitting: Internal Medicine

## 2016-06-19 NOTE — Telephone Encounter (Signed)
To Dr. Mulberry for approval 

## 2016-06-19 NOTE — Telephone Encounter (Signed)
Patient needs letter from Dr. Delrae Alfred to medical supply facility so that she can obtain a shower chair and gloves.  Patient stated she forgot to get letter from Dr. Delrae Alfred at her last office visit on June 16, 2016.

## 2016-07-26 ENCOUNTER — Telehealth: Payer: Self-pay

## 2016-07-26 DIAGNOSIS — I10 Essential (primary) hypertension: Secondary | ICD-10-CM

## 2016-07-26 DIAGNOSIS — M059 Rheumatoid arthritis with rheumatoid factor, unspecified: Secondary | ICD-10-CM

## 2016-07-26 MED ORDER — FUROSEMIDE 40 MG PO TABS: 40.0000 mg | ORAL_TABLET | Freq: Every day | ORAL | 11 refills | Status: DC

## 2016-07-26 MED ORDER — METHOTREXATE 2.5 MG PO TABS: 10.0000 mg | ORAL_TABLET | ORAL | 0 refills | Status: DC

## 2016-07-26 NOTE — Telephone Encounter (Signed)
Patient called stating she needs a refill on her methotrexate and Furosemide. Per Dr. Delrae Alfred okay to refill both. Methotrexate only for 1 month. Informed patient she needs to get in contact with old Rheumatologist and have her records sent over to [facility]. This needs to happen before they can schedule her with appointment. Patient stated that she was calling to get records released today.Patient informed Rx's called into pharmacy.

## 2016-07-28 ENCOUNTER — Ambulatory Visit (INDEPENDENT_AMBULATORY_CARE_PROVIDER_SITE_OTHER): Payer: Medicaid Other | Admitting: Internal Medicine

## 2016-07-28 ENCOUNTER — Encounter: Payer: Self-pay | Admitting: Internal Medicine

## 2016-07-28 VITALS — BP 138/82 | HR 88 | Resp 12 | Ht 61.0 in | Wt 179.0 lb

## 2016-07-28 DIAGNOSIS — E118 Type 2 diabetes mellitus with unspecified complications: Secondary | ICD-10-CM | POA: Diagnosis not present

## 2016-07-28 DIAGNOSIS — N898 Other specified noninflammatory disorders of vagina: Secondary | ICD-10-CM

## 2016-07-28 DIAGNOSIS — Z124 Encounter for screening for malignant neoplasm of cervix: Secondary | ICD-10-CM

## 2016-07-28 LAB — POCT WET PREP WITH KOH
KOH Prep POC: NEGATIVE
RBC Wet Prep HPF POC: NEGATIVE
TRICHOMONAS UA: NEGATIVE
Yeast Wet Prep HPF POC: NEGATIVE

## 2016-07-28 LAB — GLUCOSE, POCT (MANUAL RESULT ENTRY): POC GLUCOSE: 67 mg/dL — AB (ref 70–99)

## 2016-07-28 NOTE — Progress Notes (Signed)
   Subjective:    Patient ID: [name] [name], female    DOB: January 02, 1966, 50 y.o.   MRN: [id number]  HPI   Here to complete CPE with pap.  Last pap was 03/21/2013 and always normal in the past.  Sister who in hospice for lung cancer died 24-Jul-2023.   [name] [name] has given her a call, but patient has not set up an appt.    See CPE 06/16/2016  Review of Systems     Objective:   Physical Exam  GU:  Normal external genitalia.  No vaginal mucosal inflammation.  Cervix without lesion.  Mild white discharge.  Pap taken.  No uterine or adnexal mass or tenderness.  No CMT.   Rectal:  External tag, No mass, Heme negative dark brown stool.         Assessment & Plan:  Pap and pelvic/rectal exam to complete CPE from November.   Pap sent. Wet prep/KOH unremarkable. Patient shares her recent A1C with Endocrinology was 6.9%, excellent control. Follow up in 6 months

## 2016-08-01 LAB — CYTOLOGY - PAP

## 2016-08-02 NOTE — Progress Notes (Signed)
Spoke with patient. Results given. I also informed patient she needs to get old records from her rheumatologist so [facility] can go ahead and get her in for appointment.

## 2016-08-11 ENCOUNTER — Other Ambulatory Visit (INDEPENDENT_AMBULATORY_CARE_PROVIDER_SITE_OTHER): Payer: Medicaid Other

## 2016-08-11 DIAGNOSIS — Z1211 Encounter for screening for malignant neoplasm of colon: Secondary | ICD-10-CM | POA: Diagnosis not present

## 2016-08-15 ENCOUNTER — Other Ambulatory Visit: Payer: Self-pay

## 2016-08-18 ENCOUNTER — Telehealth: Payer: Self-pay | Admitting: Internal Medicine

## 2016-08-18 NOTE — Telephone Encounter (Signed)
Atorvistatin and Tromedole need more medication. [address] [facility] told her they do not have any more refills for her.

## 2016-08-21 LAB — POC HEMOCCULT BLD/STL (HOME/3-CARD/SCREEN)
FECAL OCCULT BLD: NEGATIVE
FECAL OCCULT BLD: NEGATIVE
Fecal Occult Blood, POC: NEGATIVE

## 2016-08-21 MED ORDER — TRAMADOL HCL 50 MG PO TABS: 50.0000 mg | ORAL_TABLET | Freq: Four times a day (QID) | ORAL | 4 refills | Status: DC | PRN

## 2016-08-21 NOTE — Progress Notes (Signed)
Spoke with patient informed results were negative.

## 2016-08-21 NOTE — Telephone Encounter (Signed)
Spoke with patient. Informed she has been taking 4 tramadol daily. Per Dr. Delrae Alfred she needs to only take a max of 3 a day. Patient informed she needs to follow up with her Rheumatologist for further evaluation if she is having that much pain from arthritis. Patient states she has appointment with Rheumatologist on 09/01/16. To Dr. Delrae Alfred for approval.

## 2016-08-21 NOTE — Telephone Encounter (Signed)
Patient needs refill of traMADol 50 mg tablet,  Also needs atorvastatin (Lippitor) 40 mg tablet. Patient needs [facility] into [facility] [address].

## 2016-08-22 ENCOUNTER — Other Ambulatory Visit: Payer: Self-pay | Admitting: Internal Medicine

## 2016-08-25 ENCOUNTER — Ambulatory Visit (INDEPENDENT_AMBULATORY_CARE_PROVIDER_SITE_OTHER): Payer: Medicaid Other | Admitting: Internal Medicine

## 2016-08-25 ENCOUNTER — Telehealth: Payer: Self-pay

## 2016-08-25 ENCOUNTER — Telehealth: Payer: Self-pay | Admitting: Internal Medicine

## 2016-08-25 ENCOUNTER — Encounter: Payer: Self-pay | Admitting: Internal Medicine

## 2016-08-25 VITALS — BP 128/70 | HR 64 | Temp 98.1°F | Resp 12 | Ht 61.0 in | Wt 185.0 lb

## 2016-08-25 DIAGNOSIS — L02214 Cutaneous abscess of groin: Secondary | ICD-10-CM | POA: Diagnosis not present

## 2016-08-25 DIAGNOSIS — E118 Type 2 diabetes mellitus with unspecified complications: Secondary | ICD-10-CM | POA: Diagnosis not present

## 2016-08-25 LAB — GLUCOSE, POCT (MANUAL RESULT ENTRY): POC GLUCOSE: 120 mg/dL — AB (ref 70–99)

## 2016-08-25 MED ORDER — AMOXICILLIN-POT CLAVULANATE 875-125 MG PO TABS: 1.0000 | ORAL_TABLET | Freq: Two times a day (BID) | ORAL | 0 refills | Status: DC

## 2016-08-25 MED ORDER — FLUCONAZOLE 150 MG PO TABS: ORAL_TABLET | ORAL | 0 refills | Status: DC

## 2016-08-25 MED ORDER — CEPHALEXIN 500 MG PO CAPS: 500.0000 mg | ORAL_CAPSULE | Freq: Two times a day (BID) | ORAL | 0 refills | Status: DC

## 2016-08-25 MED ORDER — CEFTRIAXONE SODIUM 1 G IJ SOLR
1.0000 g | Freq: Once | INTRAMUSCULAR | Status: AC
Start: 2016-08-25 — End: 2016-08-25
  Administered 2016-08-25: 1 g via INTRAMUSCULAR

## 2016-08-25 NOTE — Progress Notes (Signed)
Subjective:    Patient ID: [name] [name], female    DOB: 1966/06/25, 51 y.o.   MRN: [id number]  HPI   Swelling and pain on mons pubis/left groin area for past 3 days.  No definite fever--she cannot tell if she is having a hot flash or not.  Later states maybe a temp up to 100.  Has had nausea as well.  Sugars also up and down.  Has been up to 510 last 3 days.  Sliding her Novolog for this and able to get sugar back down.  Able to keep fluids down, but not eating much.  Not sexually active with partner for some time. No vaginal discharge    Current Meds  Medication Sig  . acetaminophen (TYLENOL) 500 MG tablet Take 500-1,000 mg by mouth every 6 (six) hours as needed for moderate pain.   Marland Kitchen acyclovir (ZOVIRAX) 400 MG tablet Take 1 tablet (400 mg total) by mouth 3 (three) times daily. For 5 days  . atorvastatin (LIPITOR) 40 MG tablet TAKE 2 TABLETS BY MOUTH EVERY DAY WITH EVENING MEAL  . atropine 1 % ophthalmic solution Place 1 drop into the left eye at bedtime.  . ferrous sulfate 325 (65 FE) MG tablet Take 1 tablet (325 mg total) by mouth daily with breakfast.  . folic acid (FOLVITE) 1 MG tablet Take 1 mg by mouth daily.  . furosemide (LASIX) 40 MG tablet Take 1 tablet (40 mg total) by mouth daily. In the morning  . Incontinence Supply Disposable (POISE ULTRA THINS) PADS Use 5 pads daily as needed for urinary stress incontinence  . insulin aspart (NOVOLOG) 100 UNIT/ML injection Inject 4-16 Units into the skin 3 (three) times daily with meals. Sliding scale CBG 100-150; 4 units, 151-200; 6 units, 201-250; 8 units, 251-300; 10 units, 301-350; 12 units, 351-400; 14 units, > 401; call doctor  . insulin glargine (LANTUS) 100 UNIT/ML injection Inject 16 Units into the skin 2 (two) times daily.   . methotrexate (RHEUMATREX) 2.5 MG tablet Take 4 tablets (10 mg total) by mouth 2 (two) times a week. Caution:Chemotherapy. Protect from light.  . metoCLOPramide (REGLAN) 10 MG tablet Take 0.5 tablets (5 mg  total) by mouth 4 (four) times daily -  before meals and at bedtime. (Patient taking differently: Take 10 mg by mouth 2 (two) times daily. )  . metoprolol tartrate (LOPRESSOR) 25 MG tablet TAKE 1 TABLET (25 MG TOTAL) BY MOUTH 2 (TWO) TIMES DAILY.  . nitroGLYCERIN (NITROSTAT) 0.4 MG SL tablet Place 1 tablet (0.4 mg total) under the tongue every 5 (five) minutes as needed for chest pain.  . pantoprazole (PROTONIX) 40 MG tablet TAKE 1 TABLET (40 MG TOTAL) BY MOUTH 2 (TWO) TIMES DAILY.  Marland Kitchen sertraline (ZOLOFT) 100 MG tablet 1 tab by mouth daily (Patient taking differently: Take 100 mg by mouth daily. )  . traMADol (ULTRAM) 50 MG tablet Take 1 tablet (50 mg total) by mouth every 6 (six) hours as needed for moderate pain.   Allergies  Allergen Reactions  . Aspirin Other (See Comments)    Stomach bleeds      Review of Systems     Objective:   Physical Exam  NAD Lungs:  CTA CV:  RRR without murmur or rub Abd: S, NT, No HSM or mass. + BS Left groin:  2 cm elliptical induration with overlying erythema in left groin.  No fluctuance.  Tender.  No other abnormality noted      Assessment & Plan:  1.  Left groin infection with possible abscess Ceftriaxone 1 g IM To call report tomorrow Augmentin 875 mg twice daily for 10 days. Fluconazole to take at end in case of yeast infectionDiscussed using Metoclopramide more frequently before meals with nausea.  Later, call from pharmacy regarding interaction with MTX and Augmentin.  Switched to Cephalexin 500 mg twice daily for 10 days.  Also, patient apparently had conversation with nurse that she was sharing her glucometer and insulin pen, but not the needles with a friend with HIV.   I called her to get more information after she left:  She is unaware of her friend's viral levels or treatment, though states her friend does go to the ID clinic. She will come back for HIV testing with follow up next week and has already stopped sharing with her  friend--was for short period of time 2 weeks ago. Discussed she should never share anything she utilizes body fluids with with anyone else.

## 2016-08-25 NOTE — Telephone Encounter (Signed)
Spoke with Dr. Delrae Alfred and patient needs to be seen. Spoke with patient and she is on the way in for appointment.

## 2016-08-25 NOTE — Telephone Encounter (Signed)
Patient says she has a bump on her vagina since Wednesday. She put a heating pad on it and it blew up. Is having pain associated with bump.

## 2016-08-25 NOTE — Patient Instructions (Signed)
Call clinic in morning to give progress report. Fill Fluconazole only if you develop yeast infection symptoms

## 2016-08-25 NOTE — Telephone Encounter (Signed)
Pharmacy called stating there is a interaction between amoxicillin and methotrexate. States it can cause methotrexate toxicity. Per Dr. Delrae Alfred change to Kelflex 500 1 twice a day for 10 days.

## 2016-08-26 NOTE — Telephone Encounter (Signed)
Prescription changed to Cephalexin yesterday with pharmacy call. Patient called this morning (Saturday to give a progress report.  She is no longer feeling feverish and her nausea has subsided.   The groin swelling seems a bit closer to the surface and remains indurated. She is to call a progress report tomorrow as well.

## 2016-08-28 ENCOUNTER — Telehealth: Payer: Self-pay | Admitting: Internal Medicine

## 2016-08-28 NOTE — Telephone Encounter (Signed)
Patient called and wants to know what type of medication she can put on bump that bursted in vaginal area.  Please advise.

## 2016-08-28 NOTE — Telephone Encounter (Signed)
Spoke with patient wanted to know if there is any cream she can put on bump now that is has burst open. States she is having burning now that it is open. Patient also states that her blood sugars have been running [place] and she has no appetite. Dr. Delrae Alfred took over the call for further patient follow up.

## 2016-08-29 NOTE — Telephone Encounter (Signed)
Spoke with patient subsequently.  She has decreased appetite today, but no nausea or vomiting.  No fever.  Encouraged to put gauze bandage to absorb drainage over wound.  Continue warm packing to allow for drainage.  She has appt on Friday, but to call progress report tomorrow (now today)

## 2016-08-31 NOTE — Telephone Encounter (Signed)
Spoke with Anetha states her transportation does not have authorization to take her to [employer]. States she canceled her appointment for tomorrow with the rheumatologist. Informed I would speak with Dr. Delrae Alfred and call her back in the morning.

## 2016-08-31 NOTE — Telephone Encounter (Signed)
Patient has an appointment with a rheumatologist in [place] tomorrow 09/01/16 and she would like a referral for a rheumatologist that is either in [place] or [place].

## 2016-09-01 ENCOUNTER — Ambulatory Visit (INDEPENDENT_AMBULATORY_CARE_PROVIDER_SITE_OTHER): Payer: Medicaid Other | Admitting: Internal Medicine

## 2016-09-01 VITALS — BP 118/66 | HR 74 | Ht 61.0 in | Wt 185.0 lb

## 2016-09-01 DIAGNOSIS — M059 Rheumatoid arthritis with rheumatoid factor, unspecified: Secondary | ICD-10-CM | POA: Diagnosis not present

## 2016-09-01 DIAGNOSIS — L02214 Cutaneous abscess of groin: Secondary | ICD-10-CM

## 2016-09-01 MED ORDER — METHOTREXATE 2.5 MG PO TABS: 10.0000 mg | ORAL_TABLET | ORAL | 1 refills | Status: DC

## 2016-09-01 NOTE — Patient Instructions (Signed)
Call progress report Monday morning to clinic

## 2016-09-01 NOTE — Telephone Encounter (Signed)
Patient will discuss with Dr. Delrae Alfred today at follow up appointment.

## 2016-09-01 NOTE — Progress Notes (Signed)
   Subjective:    Patient ID: [name] [name], female    DOB: September 16, 1965, 51 y.o.   MRN: [id number]  HPI   Left groin induration/abscess    Review of Systems     Objective:   Physical Exam  Left groin:  Small soft bump in area where abscess was previously.  No erythema now.       Assessment & Plan:  1.  Left Groin Abscess:  To call if worsens, but appears to be clinically still resolving.    2.  Rheumatoid Arthritis:  Was not happy with the Rheumatologist we were planning to send her to--to far to go.  Working on new referral.  Filling MTX for time being.

## 2016-09-04 ENCOUNTER — Telehealth: Payer: Self-pay | Admitting: Internal Medicine

## 2016-09-04 NOTE — Telephone Encounter (Signed)
Spoke with patient states she is feeling better and bump continues to drain. Informed patient itching is a sign of healing and per Dr. Delrae Alfred things are moving in the right direction and to continue doing what she is doing.

## 2016-09-04 NOTE — Telephone Encounter (Signed)
Patient called to inform doctor that she is itching some and bump is draining.  Patient can be reached at [phone] or [phone].

## 2016-09-05 ENCOUNTER — Other Ambulatory Visit: Payer: Self-pay | Admitting: Internal Medicine

## 2016-09-05 DIAGNOSIS — Z1231 Encounter for screening mammogram for malignant neoplasm of breast: Secondary | ICD-10-CM

## 2016-09-07 ENCOUNTER — Ambulatory Visit (INDEPENDENT_AMBULATORY_CARE_PROVIDER_SITE_OTHER): Payer: Medicaid Other | Admitting: Licensed Clinical Social Worker

## 2016-09-07 DIAGNOSIS — Z1389 Encounter for screening for other disorder: Secondary | ICD-10-CM

## 2016-09-07 DIAGNOSIS — Z1331 Encounter for screening for depression: Secondary | ICD-10-CM
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
## 2016-09-12 ENCOUNTER — Ambulatory Visit (INDEPENDENT_AMBULATORY_CARE_PROVIDER_SITE_OTHER): Payer: Medicaid Other | Admitting: Internal Medicine

## 2016-09-12 ENCOUNTER — Other Ambulatory Visit: Payer: Self-pay

## 2016-09-12 ENCOUNTER — Encounter: Payer: Self-pay | Admitting: Internal Medicine

## 2016-09-12 VITALS — BP 130/82 | HR 70 | Resp 16 | Ht 61.0 in | Wt 182.0 lb

## 2016-09-12 DIAGNOSIS — L02214 Cutaneous abscess of groin: Secondary | ICD-10-CM

## 2016-09-12 DIAGNOSIS — E118 Type 2 diabetes mellitus with unspecified complications: Secondary | ICD-10-CM

## 2016-09-12 LAB — GLUCOSE, POCT (MANUAL RESULT ENTRY): POC Glucose: 211 mg/dl — AB (ref 70–99)

## 2016-09-12 MED ORDER — CEPHALEXIN 500 MG PO CAPS: 500.0000 mg | ORAL_CAPSULE | Freq: Two times a day (BID) | ORAL | 0 refills | Status: DC

## 2016-09-12 NOTE — Progress Notes (Signed)
   Subjective:    Patient ID: [name] [name], female    DOB: 04/23/1966, 51 y.o.   MRN: [id number]  HPI  Finished her Cephalexin yesterday morning, but still with a swelling in her left groin. No longer with drainage.   Did develop a yeast infection and took the Fluconazole with resolution. No fever.  No problems with Cephalexin.    Review of Systems     Objective:   Physical Exam Left groin with 1 cm by 1 cm induration beneath skin, NT.  No overlying redness.       Assessment & Plan:  Resolving abscess/cellulitis:  Will continue treatment for 5 more days.  Cephalexin 500 mg twice daily Call if any problems To continue warm compresses to area.

## 2016-09-18 NOTE — Telephone Encounter (Signed)
Patient called today and stated bump is still there, small bump now.

## 2016-09-20 ENCOUNTER — Telehealth: Payer: Self-pay | Admitting: Internal Medicine

## 2016-09-20 NOTE — Telephone Encounter (Signed)
Patient called and wanted Dr. Delrae Alfred to know that bump is still there.

## 2016-09-22 NOTE — Telephone Encounter (Signed)
We discussed earlier in week.  It sounded like it was down to dime size and much better.  Cherice gave her info to continue to warm pack twice daily and call if it starts to enlarge again.

## 2016-09-27 ENCOUNTER — Ambulatory Visit: Payer: Self-pay | Admitting: Licensed Clinical Social Worker

## 2016-09-27 DIAGNOSIS — Z9149 Other personal history of psychological trauma, not elsewhere classified: Secondary | ICD-10-CM | POA: Diagnosis not present
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
## 2016-10-05 ENCOUNTER — Telehealth: Payer: Self-pay

## 2016-10-05 NOTE — Telephone Encounter (Signed)
Patient requested ROI to be mailed to her home address. Placed in mail today.

## 2016-10-06 ENCOUNTER — Encounter: Payer: Self-pay | Admitting: *Deleted
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
## 2016-10-13 ENCOUNTER — Encounter: Payer: Self-pay | Admitting: Interventional Cardiology

## 2016-10-13 ENCOUNTER — Other Ambulatory Visit: Payer: Self-pay

## 2016-10-13 ENCOUNTER — Encounter (INDEPENDENT_AMBULATORY_CARE_PROVIDER_SITE_OTHER): Payer: Self-pay

## 2016-10-13 ENCOUNTER — Ambulatory Visit (INDEPENDENT_AMBULATORY_CARE_PROVIDER_SITE_OTHER): Payer: Medicaid Other | Admitting: Interventional Cardiology

## 2016-10-13 VITALS — BP 116/72 | HR 78 | Ht 61.0 in | Wt 185.4 lb

## 2016-10-13 DIAGNOSIS — I130 Hypertensive heart and chronic kidney disease with heart failure and stage 1 through stage 4 chronic kidney disease, or unspecified chronic kidney disease: Secondary | ICD-10-CM

## 2016-10-13 DIAGNOSIS — I25118 Atherosclerotic heart disease of native coronary artery with other forms of angina pectoris: Secondary | ICD-10-CM

## 2016-10-13 DIAGNOSIS — Z720 Tobacco use: Secondary | ICD-10-CM

## 2016-10-13 DIAGNOSIS — E785 Hyperlipidemia, unspecified: Secondary | ICD-10-CM

## 2016-10-13 DIAGNOSIS — I252 Old myocardial infarction: Secondary | ICD-10-CM | POA: Diagnosis not present

## 2016-10-13 IMAGING — US US ABDOMEN LIMITED
1 series · 14 of 25 positions shown · non-contrast
Comparison: Ultrasound 02/25/2015.  CT 02/21/2015.

CLINICAL DATA: Right upper quadrant pain .

EXAM:
US ABDOMEN LIMITED - RIGHT UPPER QUADRANT

[Series 1: us abdomen limited · 0.30mm/px · 14 of 34 slices shown]
[im 1/34]
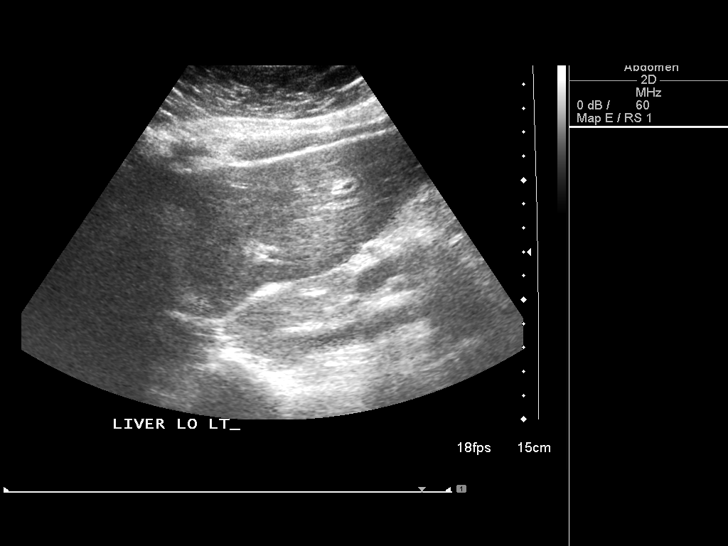
[im 3/34]
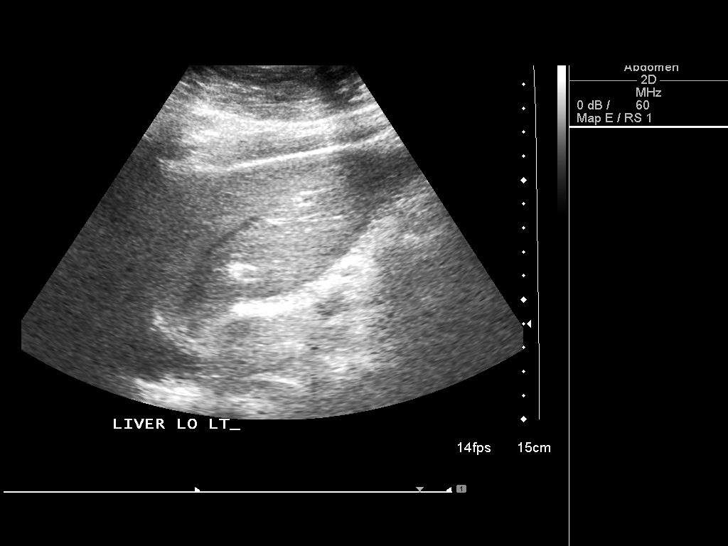
[im 6/34]
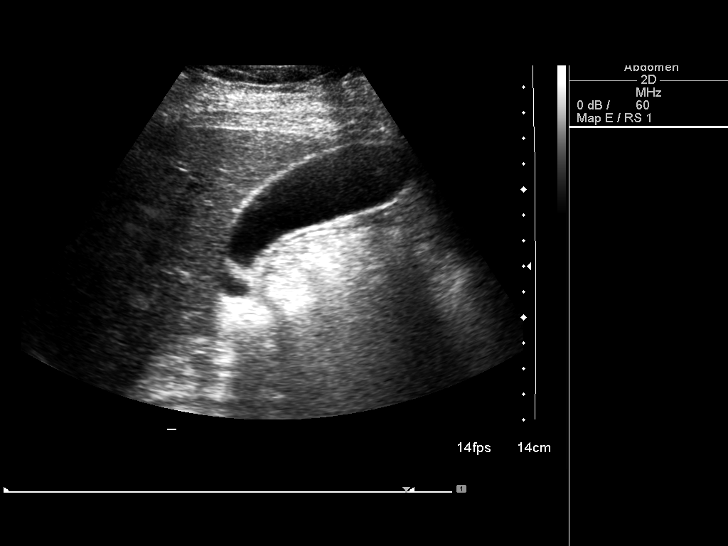
[im 9/34]
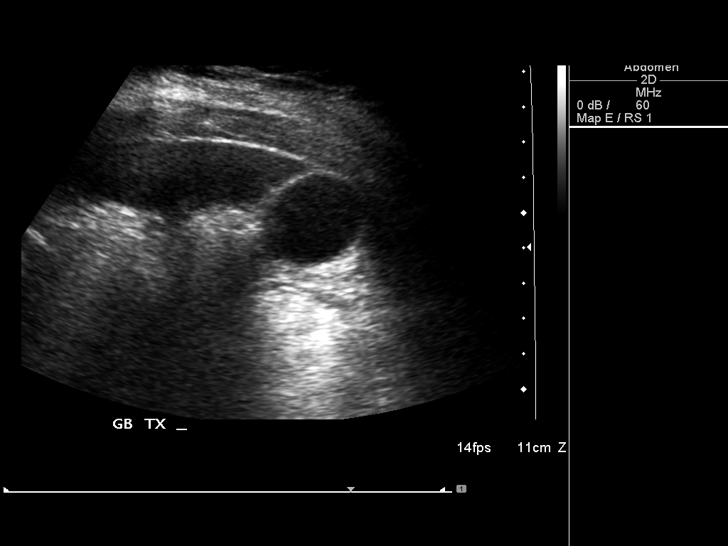
[im 12/34]
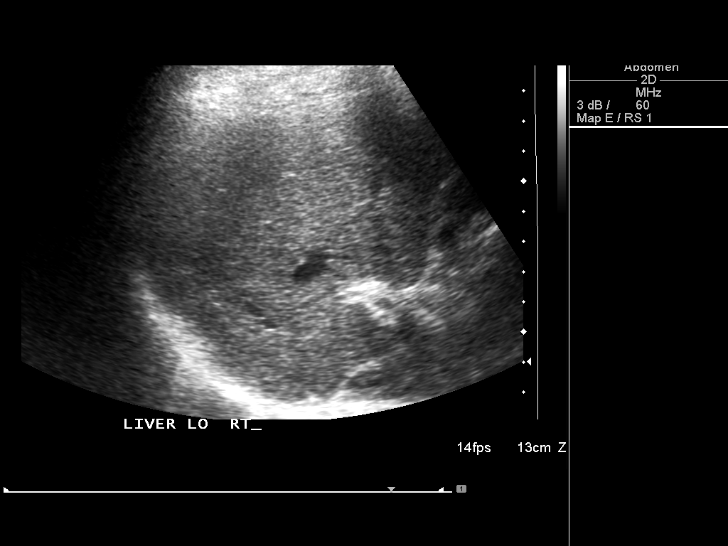
[im 13/34]
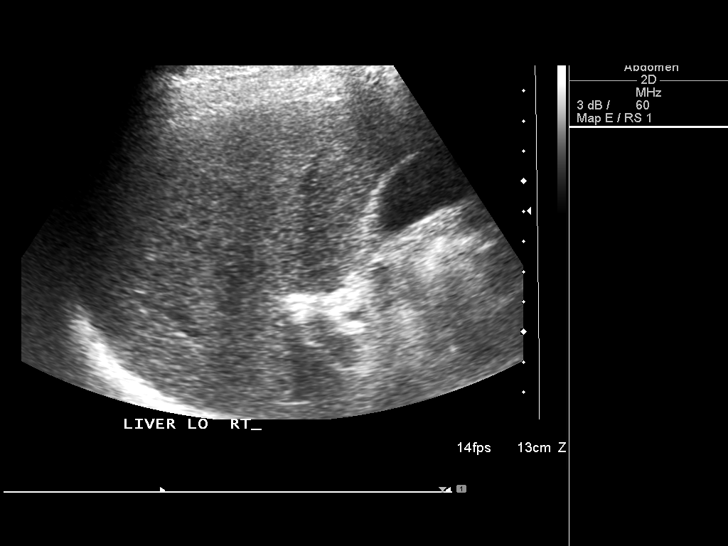
[im 16/34]
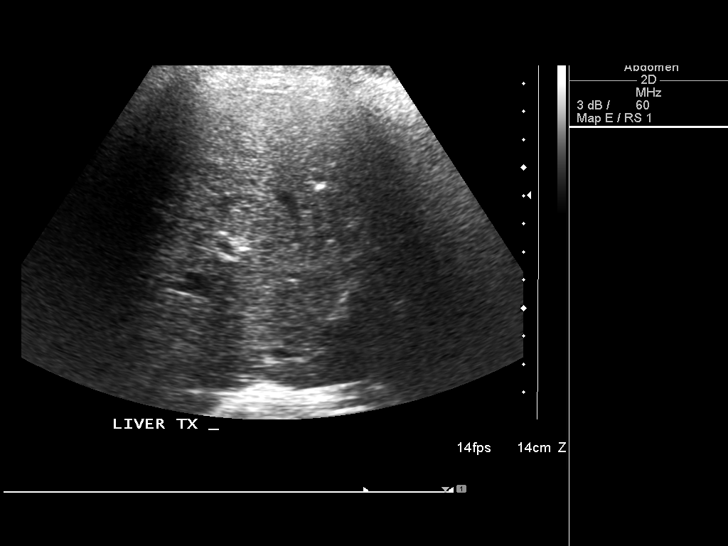
[im 18/34]
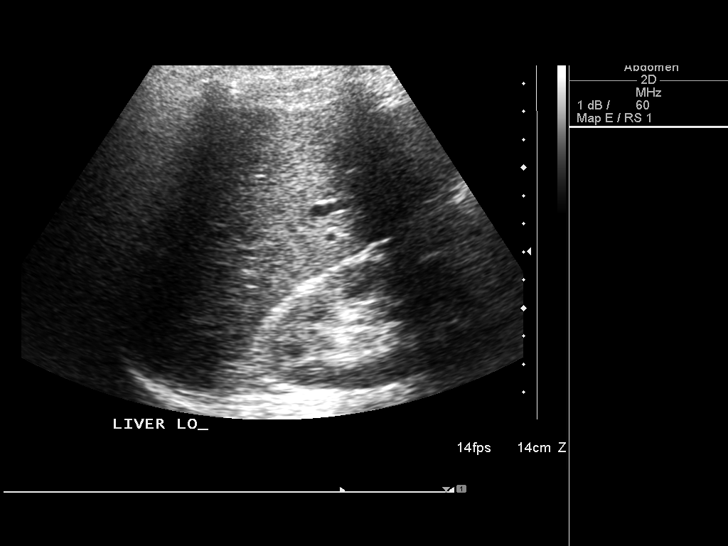
[im 21/34]
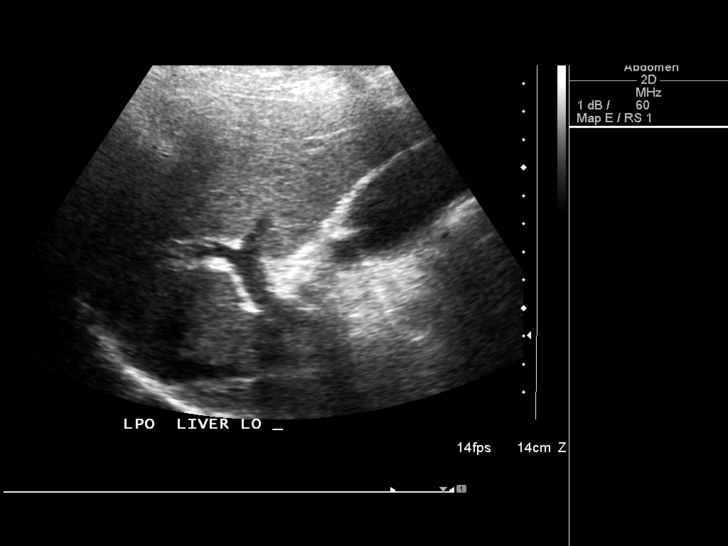
[im 23/34]
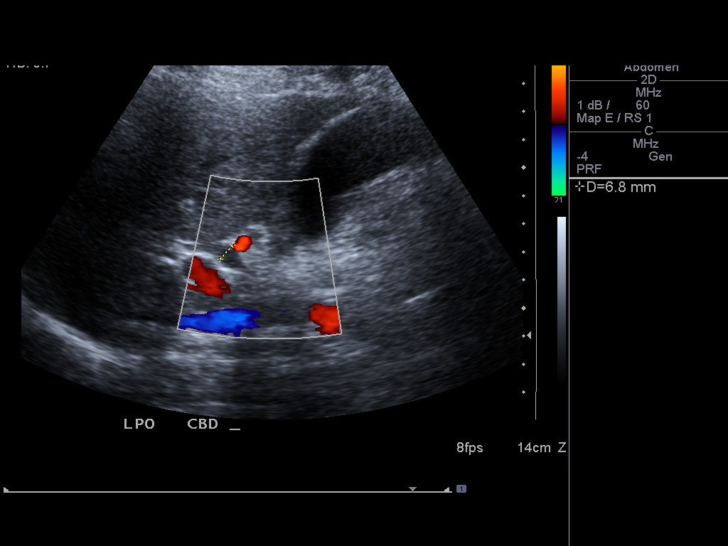
[im 25/34]
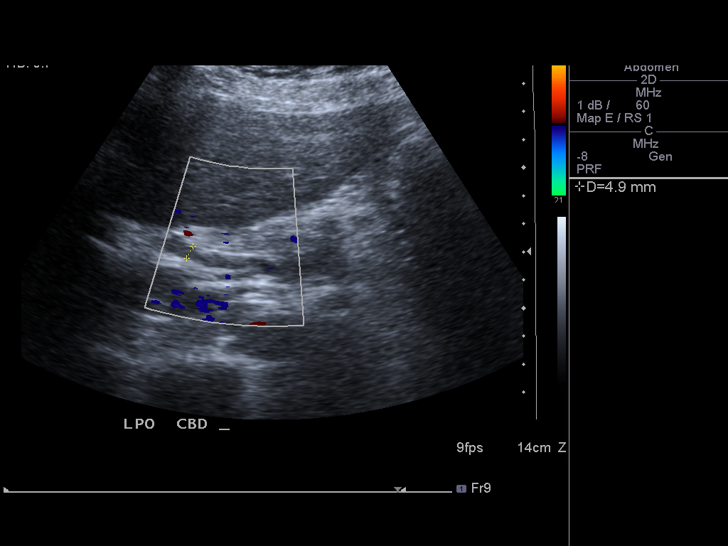
[im 28/34]
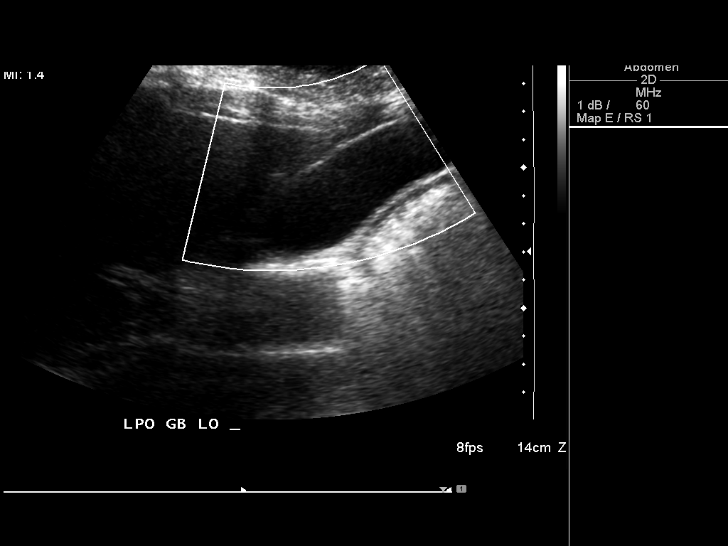
[im 31/34]
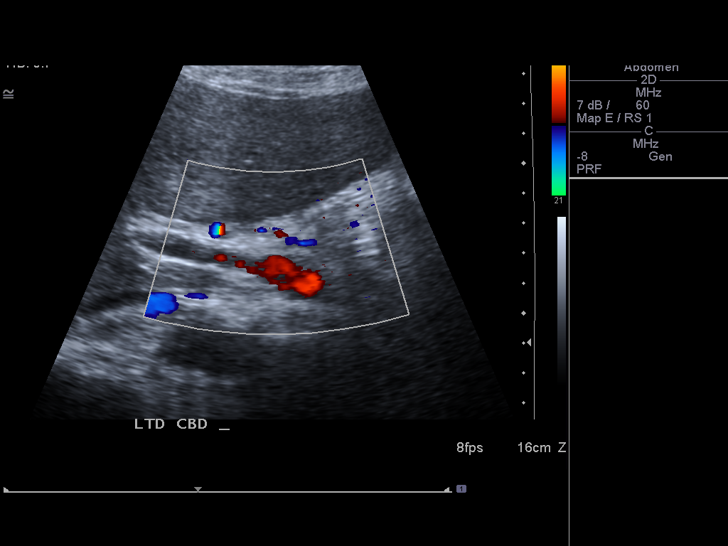
[im 34/34]
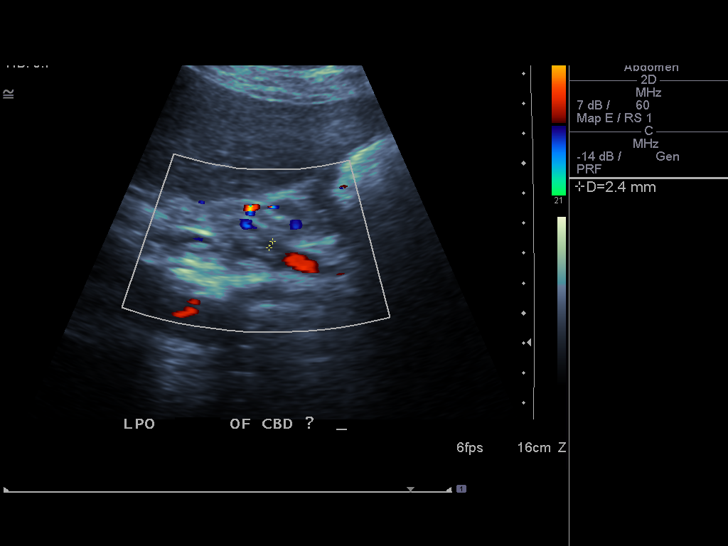

[14 of 25 positions shown; findings below may reference images not displayed]

FINDINGS: Gallbladder:

No gallstones or wall thickening visualized. No sonographic Murphy
sign noted by sonographer.

Common bile duct:

Diameter: 4.2 mm. Questionable debris noted within the common bile
duct.

Liver:

Coarse hepatic echotexture. Fatty infiltration and/or hepatocellular
disease cannot be excluded. No focal abnormality .
IMPRESSION: 1. Questionable debris in the common bile duct. No biliary
distention. MRCP can be obtained for further evaluation.

2. Coarse hepatic echotexture. Fatty infiltration and/or pedis 80
disease cannot be excluded .

## 2016-10-13 NOTE — Patient Instructions (Signed)
Medication Instructions:  Your physician recommends that you continue on your current medications as directed. Please refer to the Current Medication list given to you today.   Labwork: TODAY CMET, LIPID  Testing/Procedures: NONE  Follow-Up: Your physician wants you to follow-up in: 6 MONTHS WITH DR. VARANASI.  You will receive a reminder letter in the mail two months in advance. If you don't receive a letter, please call our office to schedule the follow-up appointment.   Any Other Special Instructions Will Be Listed Below (If Applicable).     If you need a refill on your cardiac medications before your next appointment, please call your pharmacy.

## 2016-10-14 LAB — COMPREHENSIVE METABOLIC PANEL
ALT: 9 IU/L (ref 0–32)
AST: 21 IU/L (ref 0–40)
Albumin/Globulin Ratio: 1.3 (ref 1.2–2.2)
Albumin: 4.5 g/dL (ref 3.5–5.5)
Alkaline Phosphatase: 175 IU/L — ABNORMAL HIGH (ref 39–117)
BUN/Creatinine Ratio: 16 (ref 9–23)
BUN: 15 mg/dL (ref 6–24)
Bilirubin Total: 0.5 mg/dL (ref 0.0–1.2)
CALCIUM: 10 mg/dL (ref 8.7–10.2)
CO2: 26 mmol/L (ref 18–29)
CREATININE: 0.91 mg/dL (ref 0.57–1.00)
Chloride: 101 mmol/L (ref 96–106)
GFR calc Af Amer: 85 mL/min/{1.73_m2} (ref >59)
GFR, EST NON AFRICAN AMERICAN: 74 mL/min/{1.73_m2} (ref >59)
GLOBULIN, TOTAL: 3.4 g/dL (ref 1.5–4.5)
GLUCOSE: 74 mg/dL (ref 65–99)
Potassium: 4.5 mmol/L (ref 3.5–5.2)
SODIUM: 142 mmol/L (ref 134–144)
Total Protein: 7.9 g/dL (ref 6.0–8.5)

## 2016-10-14 LAB — LIPID PANEL
CHOL/HDL RATIO: 2.6 ratio (ref 0.0–4.4)
CHOLESTEROL TOTAL: 182 mg/dL (ref 100–199)
HDL: 69 mg/dL (ref >39)
LDL CALC: 97 mg/dL (ref 0–99)
TRIGLYCERIDES: 82 mg/dL (ref 0–149)
VLDL Cholesterol Cal: 16 mg/dL (ref 5–40)

## 2016-10-18 ENCOUNTER — Ambulatory Visit: Payer: Medicaid Other

## 2016-10-20 ENCOUNTER — Ambulatory Visit
Admission: RE | Admit: 2016-10-20 | Discharge: 2016-10-20 | Disposition: A | Discharge status: Home / Self Care | Payer: Medicaid Other | Source: Ambulatory Visit | Attending: Internal Medicine | Admitting: Internal Medicine

## 2016-10-20 DIAGNOSIS — Z1231 Encounter for screening mammogram for malignant neoplasm of breast: Secondary | ICD-10-CM

## 2016-10-23 ENCOUNTER — Telehealth: Payer: Self-pay | Admitting: Internal Medicine

## 2016-10-23 NOTE — Telephone Encounter (Signed)
Patient calling regarding arthritis referral.

## 2016-10-23 NOTE — Telephone Encounter (Signed)
Spoke with patient. States she still cannot get transportation to [employer] to see Rheumatologist. Patient asking for a new referral to a different Rheumatologist. Patient needs someone close to this area. To Dr. Delrae Alfred for further direction

## 2016-10-23 NOTE — Telephone Encounter (Signed)
Okay, let's see about sending her to Dr. Zenovia Jordan at [facility].

## 2016-10-24 NOTE — Telephone Encounter (Signed)
Spoke with patient . Informed will try to get appointment with Dr. Nickola Major at [facility]. Patient understood

## 2016-10-26 NOTE — Telephone Encounter (Signed)
Spoke with [facility]. Patient has appointment with them on 01/29/17 @ 6:15 pm with Dr. Sharmon Revere. Patient to arrive by 6 pm. Spoke with patient. Informed she needs to have records sent to [facility] from Dr. Ines Bloomer office. Patient verbalized understanding.

## 2016-11-09 ENCOUNTER — Other Ambulatory Visit: Payer: Self-pay | Admitting: Licensed Clinical Social Worker

## 2016-11-18 ENCOUNTER — Encounter: Payer: Self-pay | Admitting: Internal Medicine

## 2016-11-20 IMAGING — DX DG CHEST 2V
2 series · 2 of 2 positions shown · non-contrast
Comparison: 07/31/2013

CLINICAL DATA: Chest abdominal pain

EXAM:
CHEST  2 VIEW

[chest lat]
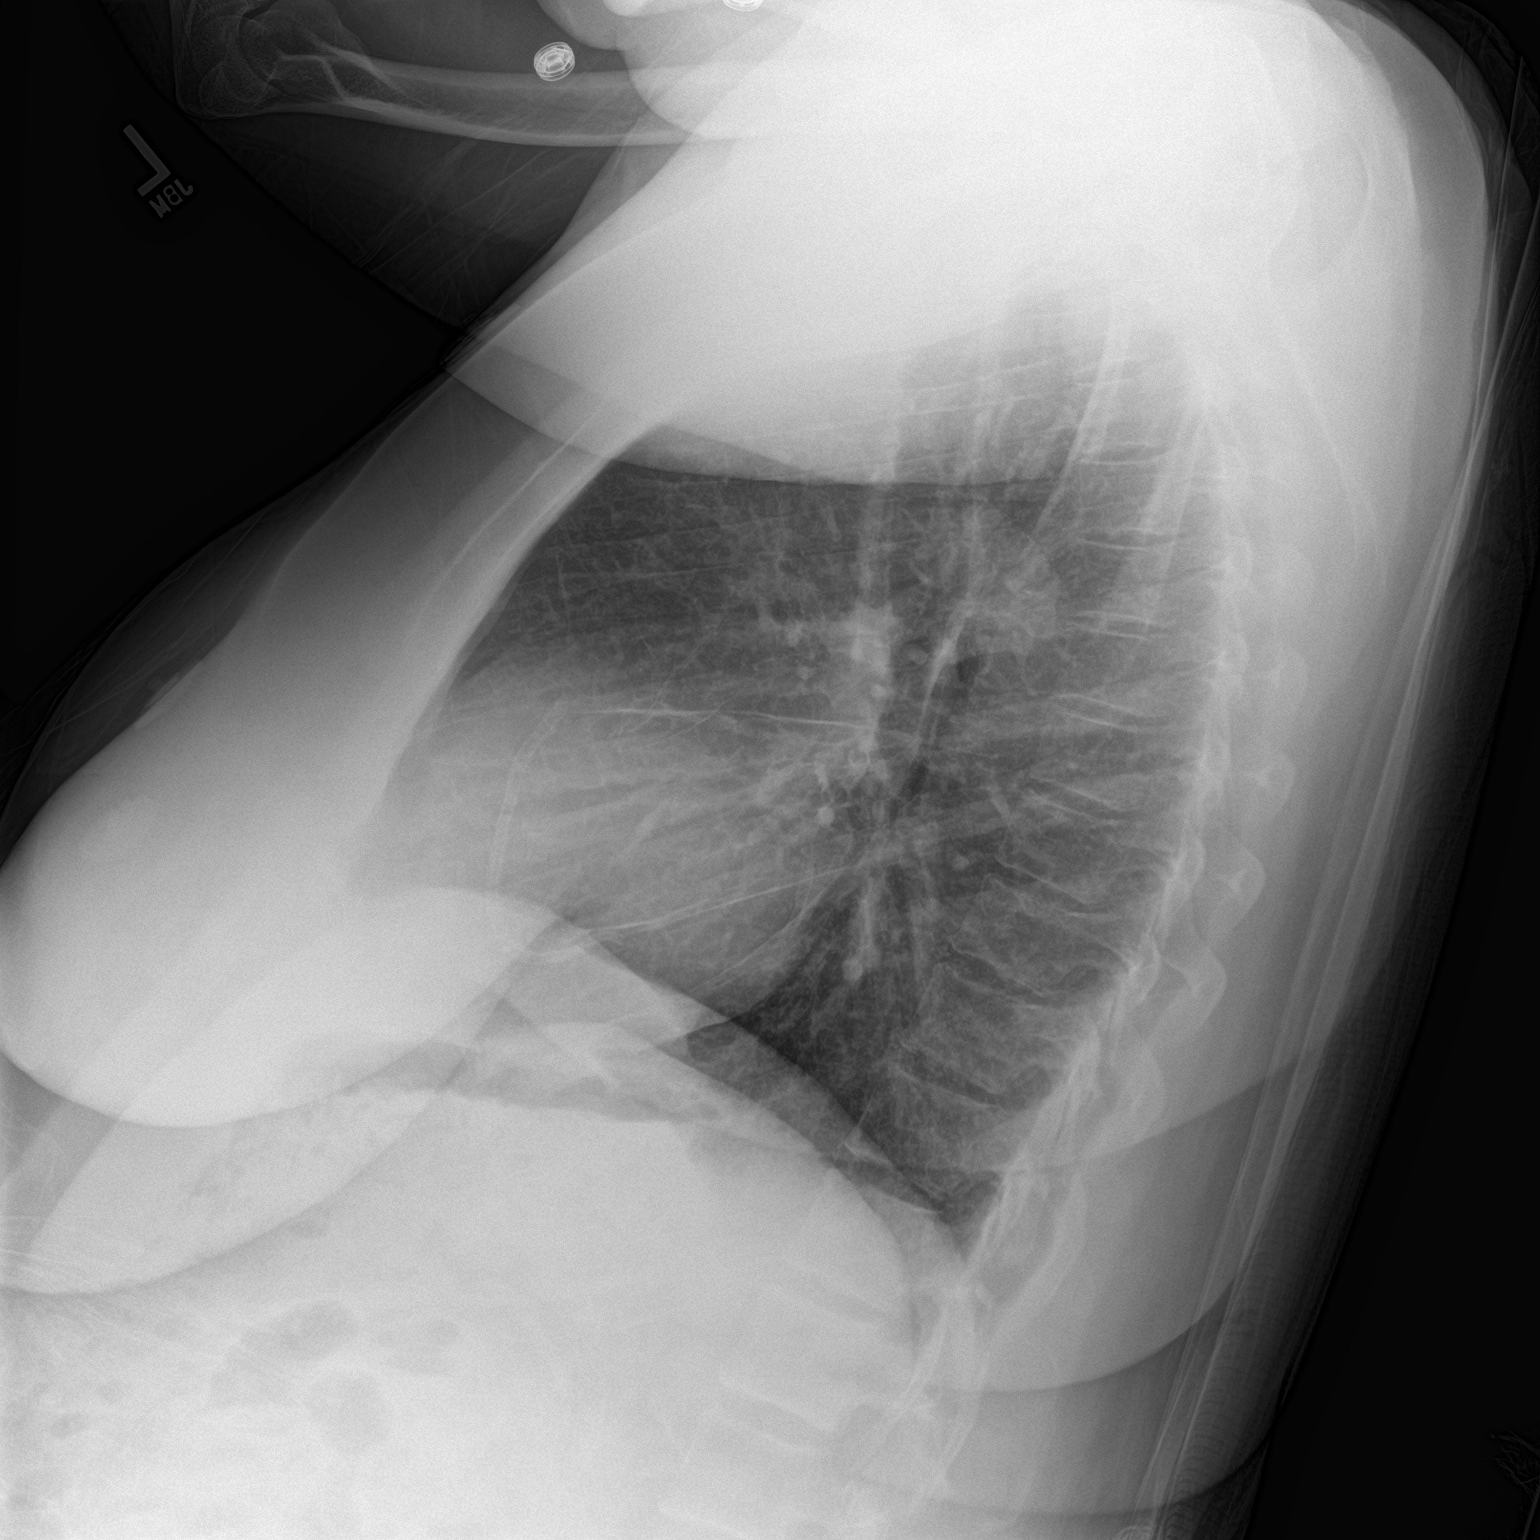

[chest ap]
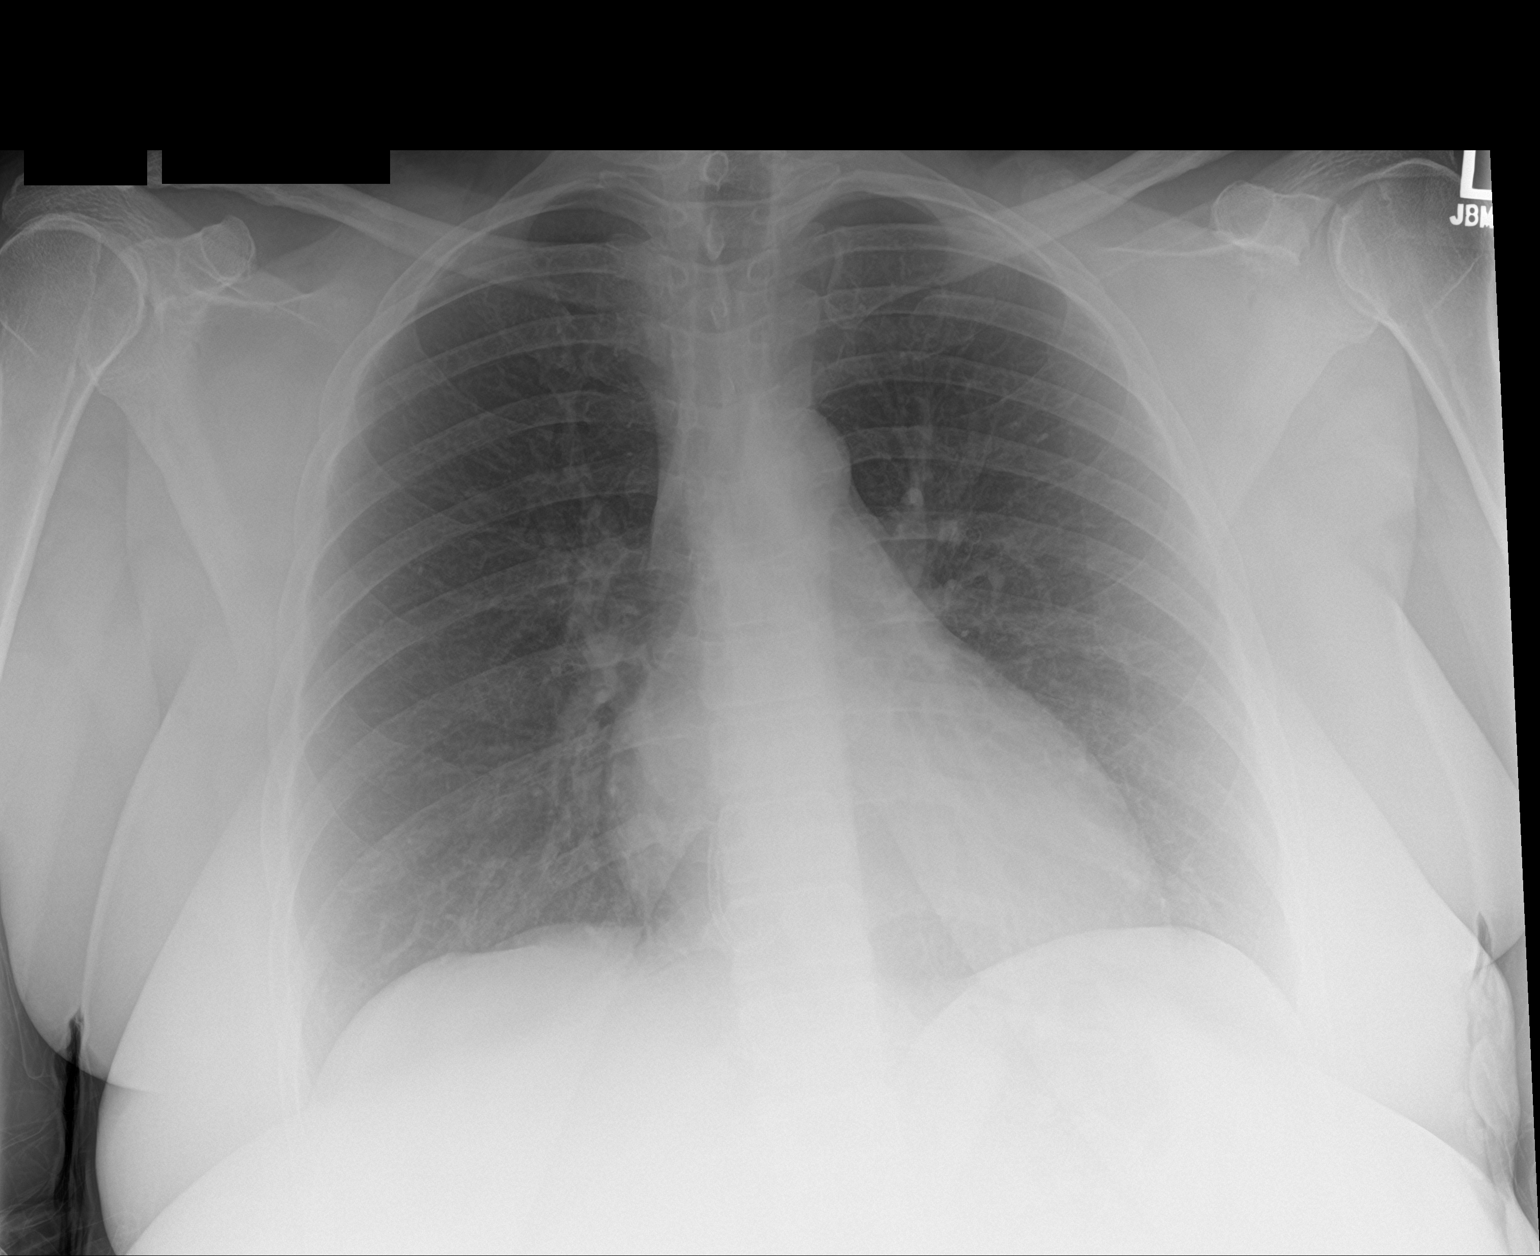

[2 of 2 positions shown; findings below may reference images not displayed]

FINDINGS: Normal heart size and mediastinal contours. RCA stenting. There is
no edema, consolidation, effusion, or pneumothorax. No acute osseous
finding.
IMPRESSION: No evidence of acute disease.

## 2016-12-01 ENCOUNTER — Encounter: Payer: Self-pay | Admitting: Internal Medicine

## 2016-12-01 ENCOUNTER — Ambulatory Visit (INDEPENDENT_AMBULATORY_CARE_PROVIDER_SITE_OTHER): Payer: Medicaid Other | Admitting: Internal Medicine

## 2016-12-01 VITALS — BP 120/72 | HR 84 | Temp 98.2°F | Resp 14 | Ht 61.0 in | Wt 184.0 lb

## 2016-12-01 DIAGNOSIS — J209 Acute bronchitis, unspecified: Secondary | ICD-10-CM | POA: Diagnosis not present

## 2016-12-01 DIAGNOSIS — J0140 Acute pansinusitis, unspecified: Secondary | ICD-10-CM | POA: Diagnosis not present

## 2016-12-01 DIAGNOSIS — Z720 Tobacco use: Secondary | ICD-10-CM | POA: Diagnosis not present

## 2016-12-01 DIAGNOSIS — E118 Type 2 diabetes mellitus with unspecified complications: Secondary | ICD-10-CM | POA: Diagnosis not present

## 2016-12-01 LAB — GLUCOSE, POCT (MANUAL RESULT ENTRY): POC Glucose: 137 mg/dl — AB (ref 70–99)

## 2016-12-01 MED ORDER — AMOXICILLIN-POT CLAVULANATE 875-125 MG PO TABS: 1.0000 | ORAL_TABLET | Freq: Two times a day (BID) | ORAL | 0 refills | Status: DC

## 2016-12-01 MED ORDER — FLUCONAZOLE 150 MG PO TABS: 150.0000 mg | ORAL_TABLET | Freq: Once | ORAL | 0 refills | Status: DC

## 2016-12-01 MED ORDER — ALBUTEROL SULFATE HFA 108 (90 BASE) MCG/ACT IN AERS: 2.0000 | INHALATION_SPRAY | Freq: Four times a day (QID) | RESPIRATORY_TRACT | 0 refills | Status: DC | PRN

## 2016-12-01 NOTE — Progress Notes (Signed)
Subjective:    Patient ID: [name] [name], female    DOB: 08/26/1965, 51 y.o.   MRN: [id number]  HPI   6 days ago, developed watery eyes, sore throat, runny nose, cough, sneezing, myalgias.   Did not have a fever.  Was seen by endocrinologist, Dr.  Sharl Ma, who told her to decrease her insulin as her sugars were bottoming out.  Had chest discomfort around ribs.  Has been short of breath.  Did take NTG SL and she feels it helped.   Still smoking 1/2 ppd.  Gets rashes from nicotine patches.   Is still coughing up clear to yellow mucous.    Current Meds  Medication Sig  . acetaminophen (TYLENOL) 500 MG tablet Take 500-1,000 mg by mouth every 6 (six) hours as needed for moderate pain.   Marland Kitchen atorvastatin (LIPITOR) 40 MG tablet TAKE 2 TABLETS BY MOUTH EVERY DAY WITH EVENING MEAL (Patient taking differently: TAKE 3 TABLETS BY MOUTH EVERY DAY WITH EVENING MEAL)  . cyclobenzaprine (FLEXERIL) 10 MG tablet Take 1-2 tablets by mouth at bedtime as needed. Muscle spasms  . ferrous sulfate 325 (65 FE) MG tablet Take 1 tablet (325 mg total) by mouth daily with breakfast.  . folic acid (FOLVITE) 1 MG tablet Take 1 mg by mouth daily.  . furosemide (LASIX) 40 MG tablet Take 1 tablet (40 mg total) by mouth daily. In the morning  . Incontinence Supply Disposable (POISE ULTRA THINS) PADS Use 5 pads daily as needed for urinary stress incontinence  . insulin aspart (NOVOLOG) 100 UNIT/ML injection Inject 4-16 Units into the skin 3 (three) times daily with meals. Sliding scale CBG 100-150; 4 units, 151-200; 6 units, 201-250; 8 units, 251-300; 10 units, 301-350; 12 units, 351-400; 14 units, > 401; call doctor  . insulin glargine (LANTUS) 100 UNIT/ML injection Inject 16 Units into the skin 2 (two) times daily.   . methotrexate (RHEUMATREX) 2.5 MG tablet Take 4 tablets (10 mg total) by mouth 2 (two) times a week. Caution:Chemotherapy. Protect from light.  . metoCLOPramide (REGLAN) 10 MG tablet Take 0.5 tablets (5 mg total) by mouth  4 (four) times daily -  before meals and at bedtime. (Patient taking differently: Take 10 mg by mouth 4 (four) times daily -  before meals and at bedtime. )  . metoprolol tartrate (LOPRESSOR) 25 MG tablet TAKE 1 TABLET (25 MG TOTAL) BY MOUTH 2 (TWO) TIMES DAILY.  . nitroGLYCERIN (NITROSTAT) 0.4 MG SL tablet Place 1 tablet (0.4 mg total) under the tongue every 5 (five) minutes as needed for chest pain.  . pantoprazole (PROTONIX) 40 MG tablet TAKE 1 TABLET (40 MG TOTAL) BY MOUTH 2 (TWO) TIMES DAILY.  Marland Kitchen sertraline (ZOLOFT) 100 MG tablet 1 tab by mouth daily  . traMADol (ULTRAM) 50 MG tablet Take 1 tablet (50 mg total) by mouth every 6 (six) hours as needed for moderate pain.   Allergies  Allergen Reactions  . Aspirin Other (See Comments)    Stomach bleeds       Review of Systems     Objective:   Physical Exam  Room smells strongly of cigarette smoke  NAD HEENT:  PERRL, EOMI, TMs pearly gray throat without injection Tender over frontal and maxillary sinuses Neck:  Supple No adenopathy Chest:  CTA after two breaths.  Scattered end expiratory breaths with first two.  Tender over anterior chest--reproduces chest pain CV: RRR without murmur or rub, radial pulses normal and equal LE:  No edema  Assessment & Plan:  Acute sinusitis with minimal bronchospasm:  Augmentint 875 mg twice daily for 10 day. Albuterol HFA as needed Stop smoking--use nicotine gum as directed.  Both patient and boyfriend. Fluconazole 150 mg x 1 tab after treatment if symptoms of yeast.

## 2016-12-01 NOTE — Patient Instructions (Signed)
Tobacco Cessation:   [id number] or [phone], the former for support and possibly free nicotine patches/gum and support; the latter for [facility] Smoking cessation class. Get rid of all smoking supplies:  Cigarettes, lighters, ashtrays--no stashes just in case at home if you are serious.  For Nicotine gum:  When you feel need for smoking:  Place nicotine gum in mouth and chew until juicy, then stick between gum and cheek.  You will absorb the nicotine from your mouth.   Do not aggressively chew gum. Spit gum out in garbage once you feel you are done with it. 

## 2016-12-14 ENCOUNTER — Other Ambulatory Visit: Payer: Self-pay

## 2016-12-14 MED ORDER — FLUCONAZOLE 150 MG PO TABS: 150.0000 mg | ORAL_TABLET | Freq: Once | ORAL | 0 refills | Status: AC
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
## 2017-01-23 ENCOUNTER — Ambulatory Visit (INDEPENDENT_AMBULATORY_CARE_PROVIDER_SITE_OTHER): Payer: Self-pay | Admitting: Licensed Clinical Social Worker

## 2017-01-23 DIAGNOSIS — F4310 Post-traumatic stress disorder, unspecified: Secondary | ICD-10-CM
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
## 2017-02-06 ENCOUNTER — Ambulatory Visit (INDEPENDENT_AMBULATORY_CARE_PROVIDER_SITE_OTHER): Payer: Self-pay | Admitting: Licensed Clinical Social Worker

## 2017-02-06 DIAGNOSIS — F4310 Post-traumatic stress disorder, unspecified: Secondary | ICD-10-CM
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
## 2017-02-20 ENCOUNTER — Ambulatory Visit (INDEPENDENT_AMBULATORY_CARE_PROVIDER_SITE_OTHER): Payer: Self-pay | Admitting: Licensed Clinical Social Worker

## 2017-02-20 DIAGNOSIS — F4310 Post-traumatic stress disorder, unspecified: Secondary | ICD-10-CM
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
## 2017-02-27 ENCOUNTER — Telehealth: Payer: Self-pay | Admitting: Internal Medicine

## 2017-02-27 NOTE — Telephone Encounter (Signed)
Spoke with patient. Per patient do not fill Rx. Patient states she gets her folic acid [facility] [employer] because it is cheaper

## 2017-02-27 NOTE — Telephone Encounter (Signed)
Pharmacy called for refill of folic acid (FOLVITE) 1 mg tablet.  [phone]

## 2017-03-04 ENCOUNTER — Other Ambulatory Visit: Payer: Self-pay | Admitting: Internal Medicine

## 2017-03-05 NOTE — Telephone Encounter (Signed)
To Dr. Mulberry for approval 

## 2017-03-06 ENCOUNTER — Other Ambulatory Visit: Payer: Self-pay | Admitting: Licensed Clinical Social Worker

## 2017-03-07 ENCOUNTER — Ambulatory Visit (INDEPENDENT_AMBULATORY_CARE_PROVIDER_SITE_OTHER): Payer: Self-pay | Admitting: Licensed Clinical Social Worker

## 2017-03-07 DIAGNOSIS — F4310 Post-traumatic stress disorder, unspecified: Secondary | ICD-10-CM
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
## 2017-03-12 NOTE — Telephone Encounter (Signed)
Rx was picked up

## 2017-03-25 ENCOUNTER — Other Ambulatory Visit: Payer: Self-pay | Admitting: Internal Medicine

## 2017-03-25 DIAGNOSIS — M059 Rheumatoid arthritis with rheumatoid factor, unspecified: Secondary | ICD-10-CM

## 2017-03-26 ENCOUNTER — Other Ambulatory Visit: Payer: Medicaid Other | Admitting: Licensed Clinical Social Worker

## 2017-03-27 ENCOUNTER — Other Ambulatory Visit (INDEPENDENT_AMBULATORY_CARE_PROVIDER_SITE_OTHER): Payer: Self-pay | Admitting: Licensed Clinical Social Worker

## 2017-03-27 DIAGNOSIS — Z720 Tobacco use: Secondary | ICD-10-CM

## 2017-03-27 DIAGNOSIS — F4310 Post-traumatic stress disorder, unspecified: Secondary | ICD-10-CM

## 2017-03-27 IMAGING — US US ABDOMEN COMPLETE
1 series · 14 of 25 positions shown · non-contrast
Comparison: Ultrasound 03/13/2016

CLINICAL DATA: Recurring diffuse abdominal pain

EXAM:
ABDOMEN ULTRASOUND COMPLETE

[Series 2: us abdomen complete · 0.20mm/px · 14 of 128 slices shown]
[im 1/128]
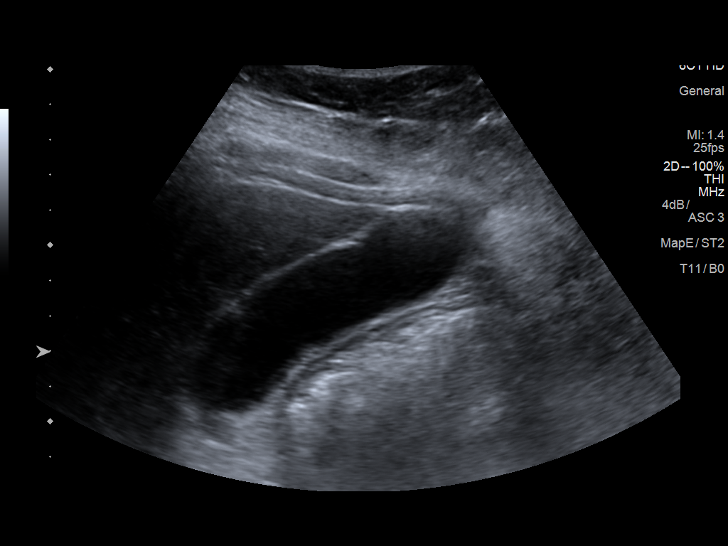
[im 11/128]
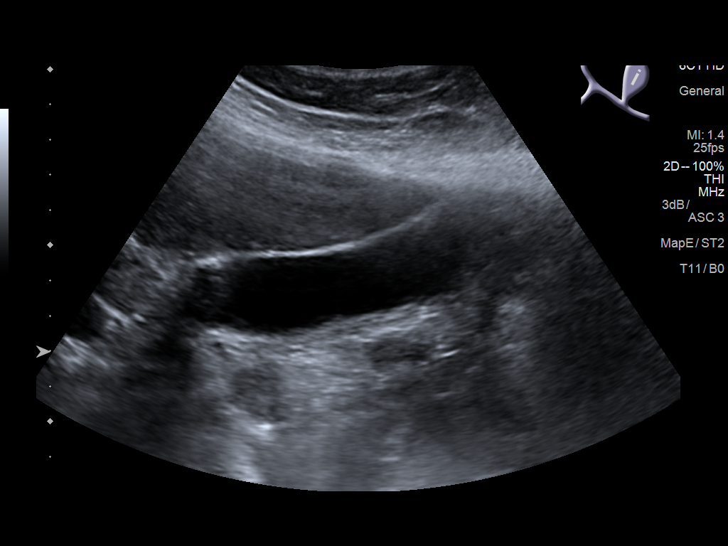
[im 22/128]
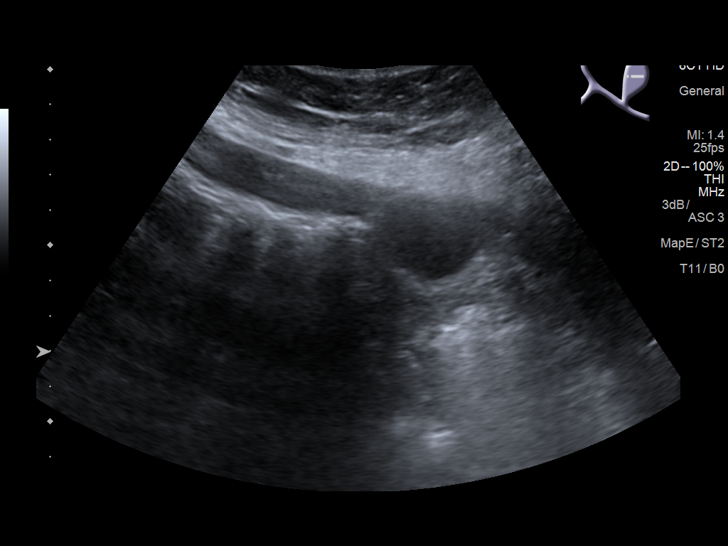
[im 32/128]
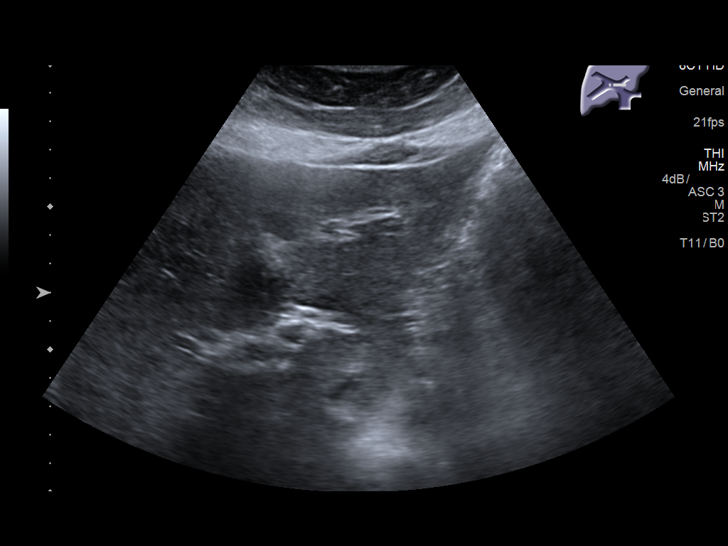
[im 43/128]
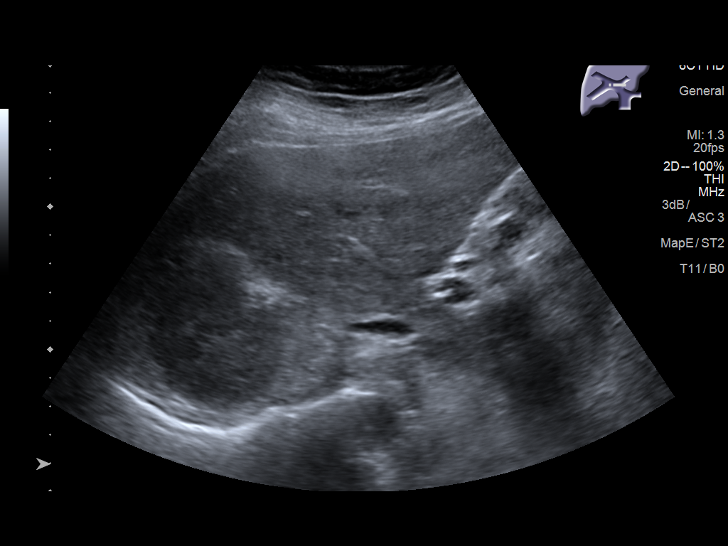
[im 48/128]
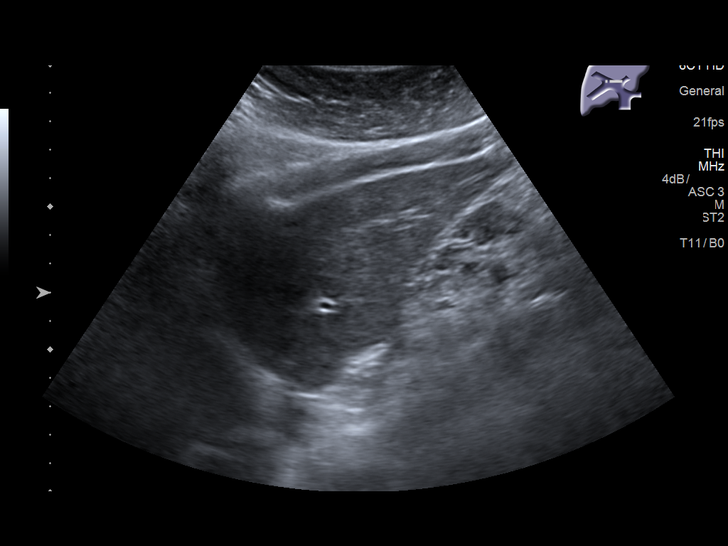
[im 59/128]
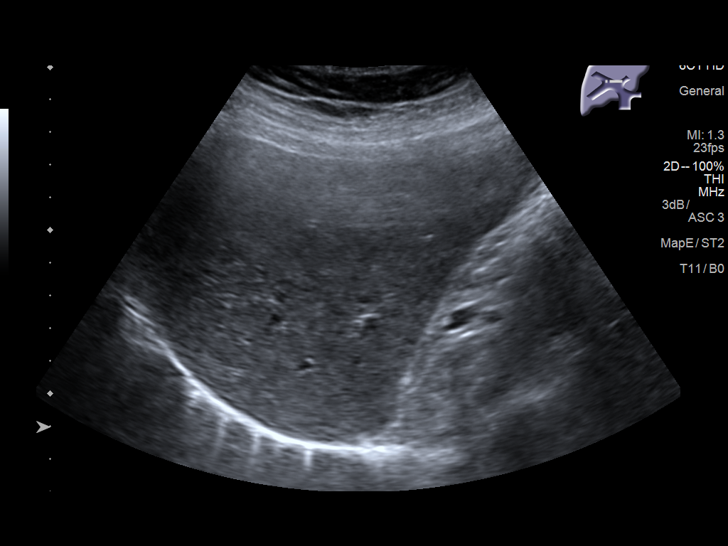
[im 69/128]
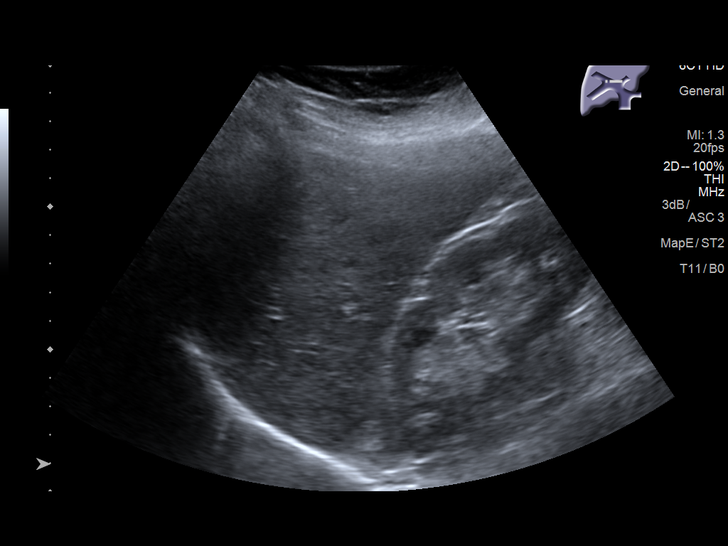
[im 80/128]
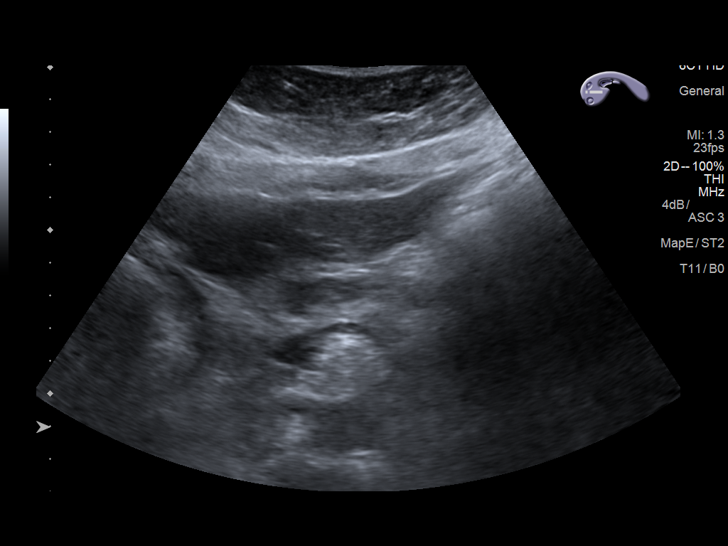
[im 85/128]
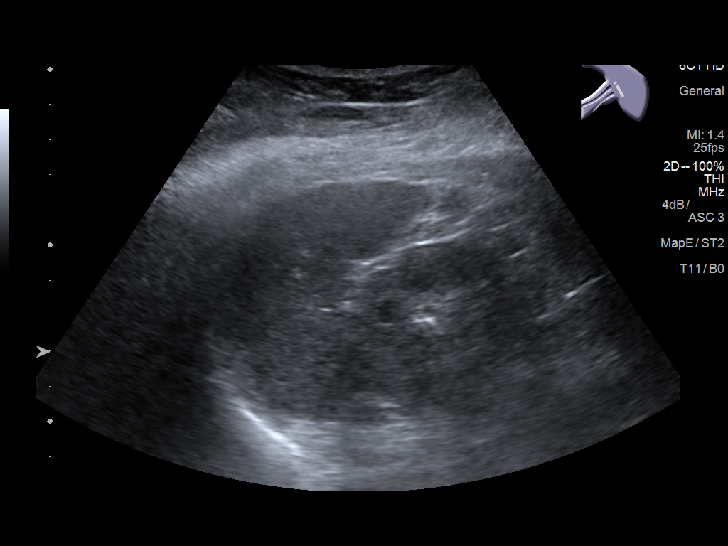
[im 96/128]
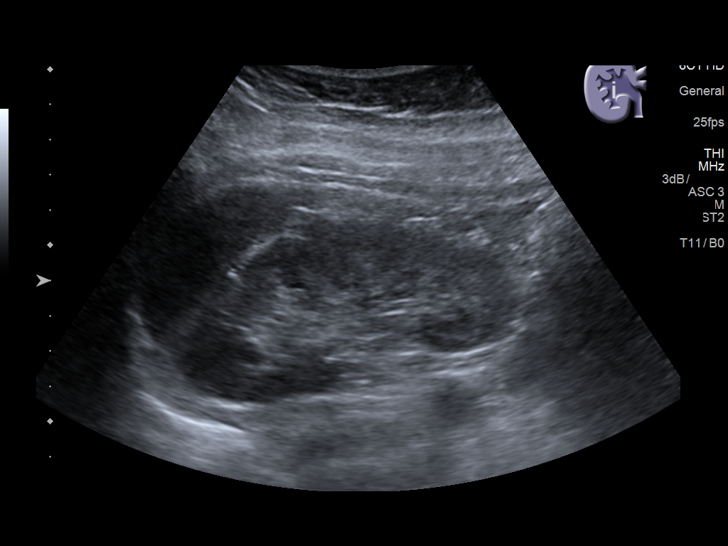
[im 106/128]
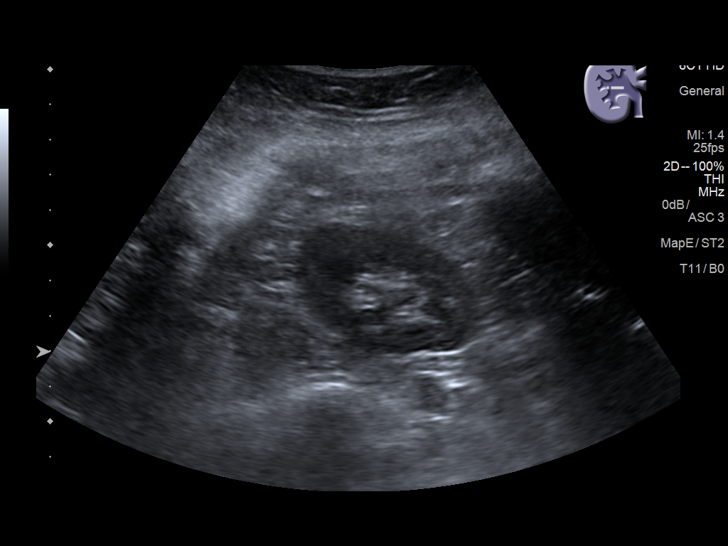
[im 117/128]
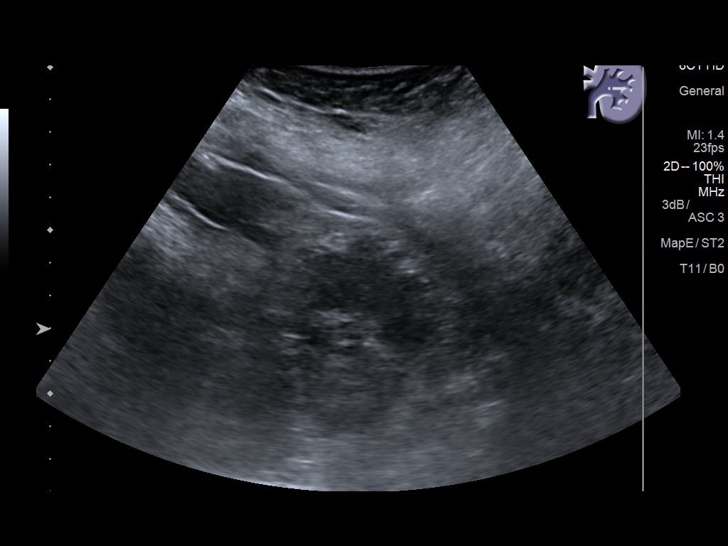
[im 128/128]
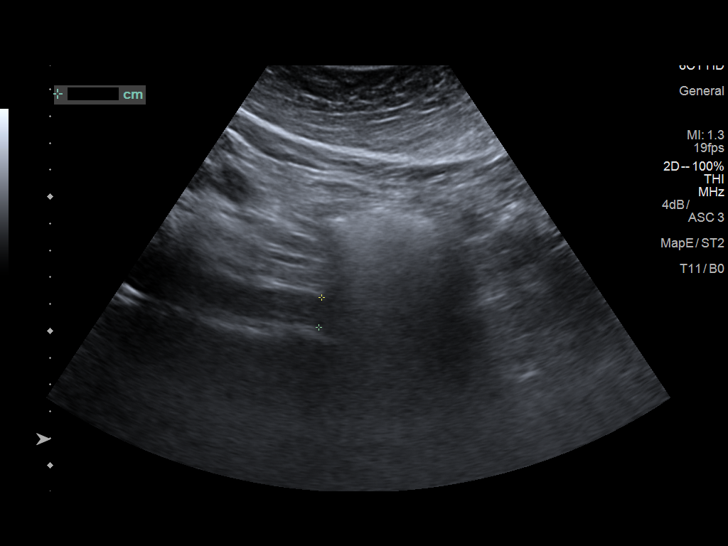

[14 of 25 positions shown; findings below may reference images not displayed]

FINDINGS: Gallbladder: No gallstones or wall thickening visualized. No
sonographic Murphy sign noted by sonographer.

Common bile duct: Diameter: 7 mm in diameter, upper limits normal

Liver: No focal lesion identified. Within normal limits in
parenchymal echogenicity.

IVC: No abnormality visualized.

Pancreas: Visualized portion unremarkable.

Spleen: Size and appearance within normal limits.

Right Kidney: Length: 10.2 cm. Echogenicity within normal limits. No
mass or hydronephrosis visualized.

Left Kidney: Length: 9.0 cm. Echogenicity within normal limits. No
mass or hydronephrosis visualized.

Abdominal aorta: No aneurysm visualized.

Other findings: None.
IMPRESSION: Borderline diameter of the common bile duct is 7 mm. There appears
to be debris within the common bile duct as noted on prior study.
Recommend correlation with LFTs. This could be further evaluated
with MRCP if felt clinically indicated.

## 2017-03-27 NOTE — Progress Notes (Signed)
   THERAPY PROGRESS NOTE  Session Time:  Participation Level: Active  Behavioral Response: Fairly GroomedAlertEuthymic  Type of Therapy: Individual Therapy  Treatment Goals addressed: Anger and Coping  Interventions: Motivational Interviewing and Supportive  Summary: [name] [name] is a 52 y.o. female who presents with a euthymic mood and appropriate affect. She reported that it is difficult to change therapists because she does not like change in general, but she is willing to try. She shared that what she liked about seeing intern Tawnya Crook was thinking through the consequences of her actions. She shared that she is working on grief, anger management, and would like to quit smoking cigarettes. 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 She shared that she used to smoke a whole pack a day but currently smokes just 8-10 cigarettes and feels motivated to reduce her use even further. She identified her motivations as her heart health, her health overall, and that the smell of smoke sickens her at times. She shared that she also wants to be a good role model for her family members. She reported that she feels confident she will be able to quit or reduce, even though it's difficult. [name] [name] shared that she currently feels very angry towards her daughter for not paying her back money. She angrily described her daughter using excuses to not pay her back. [name] [name] showed LCSW the safety plan she made for when she feels especially angry or aggressive.  Suicidal/Homicidal: Nowithout intent/plan  Therapist Response: LCSW utilized supportive counseling techniques throughout the session in order to validate emotions and encourage open expression of emotion. LCSW and Jeff discussed goals for counseling sessions and identified the focus of treatment. LCSW used Motivational Interviewing skills  to build rapport and also engage [name] [name] in change talk around why she wants to quit smoking. LCSW asked Haani to identify her motivations and imagine her life after she quits smoking.  Plan: Return again in 2 weeks.    [name], LCSW 03/27/2017

## 2017-04-10 ENCOUNTER — Ambulatory Visit (INDEPENDENT_AMBULATORY_CARE_PROVIDER_SITE_OTHER): Payer: Self-pay | Admitting: Licensed Clinical Social Worker

## 2017-04-10 DIAGNOSIS — Z720 Tobacco use: Secondary | ICD-10-CM

## 2017-04-10 DIAGNOSIS — F4310 Post-traumatic stress disorder, unspecified: Secondary | ICD-10-CM
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
## 2017-04-22 ENCOUNTER — Other Ambulatory Visit: Payer: Self-pay | Admitting: Internal Medicine

## 2017-04-24 ENCOUNTER — Ambulatory Visit (INDEPENDENT_AMBULATORY_CARE_PROVIDER_SITE_OTHER): Payer: Self-pay | Admitting: Licensed Clinical Social Worker

## 2017-04-24 DIAGNOSIS — F4310 Post-traumatic stress disorder, unspecified: Secondary | ICD-10-CM
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
## 2017-05-04 ENCOUNTER — Telehealth: Payer: Self-pay | Admitting: Internal Medicine

## 2017-05-04 NOTE — Telephone Encounter (Signed)
Spoke with patient.

## 2017-05-04 NOTE — Telephone Encounter (Signed)
Patient still having pain, earache has completed all of Rx, what should she do now.  Please leave a message or call Patient after 4:00 pm  [phone]

## 2017-05-04 NOTE — Telephone Encounter (Signed)
Left message for patient call back with the symptoms she is having

## 2017-05-22 IMAGING — MG DIGITAL SCREENING BILATERAL MAMMOGRAM WITH CAD
7 series · 7 of 7 positions shown · non-contrast
Comparison: Previous exam(s).

CLINICAL DATA: Screening.

EXAM:
DIGITAL SCREENING BILATERAL MAMMOGRAM WITH CAD

[L CC]
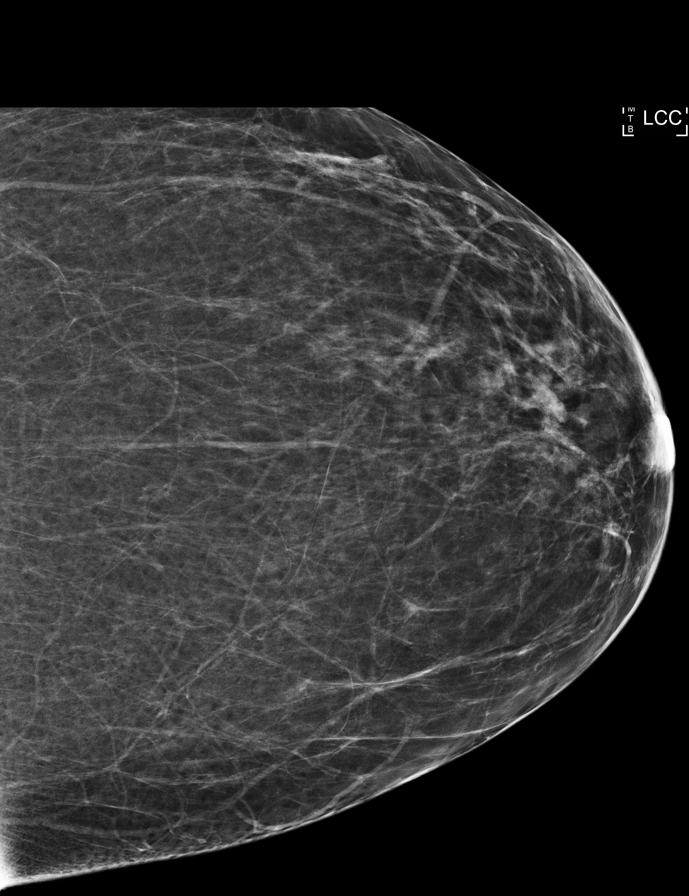

[R CC (1 of 2)]
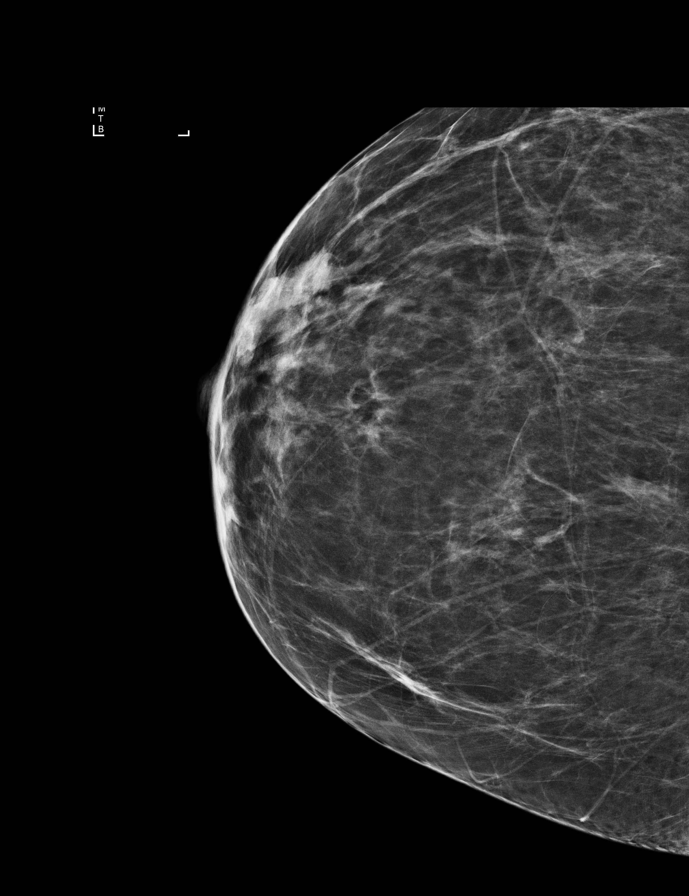

[R MLO (1 of 2)]
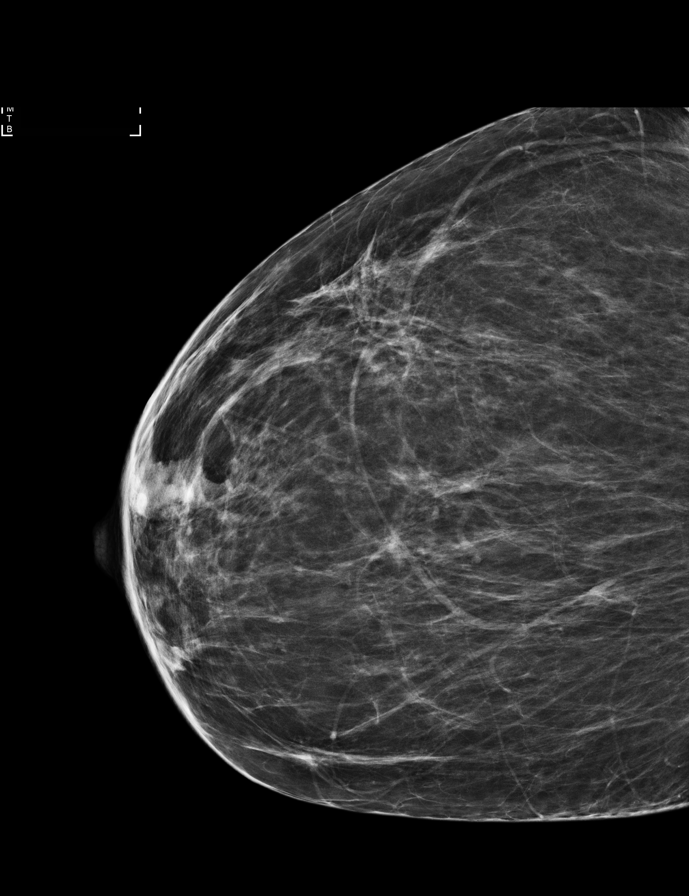

[R MLO (2 of 2)]
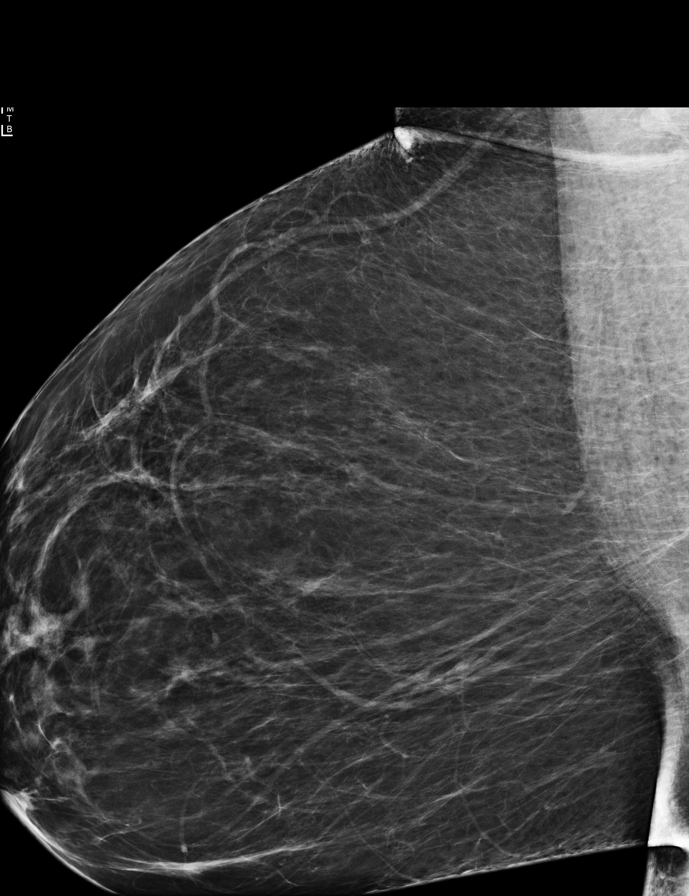

[R CC (2 of 2)]
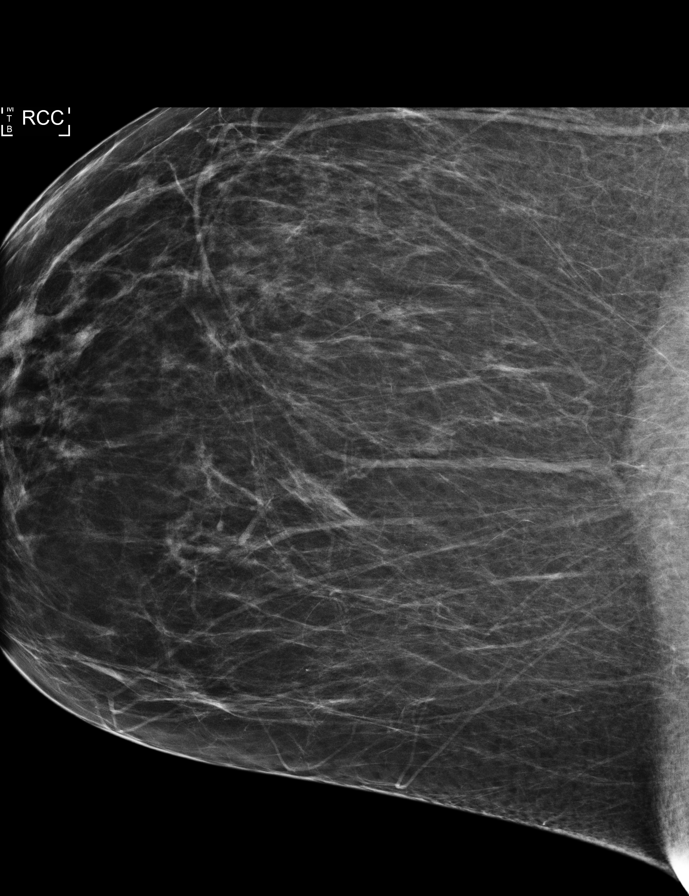

[L MLO (1 of 2)]
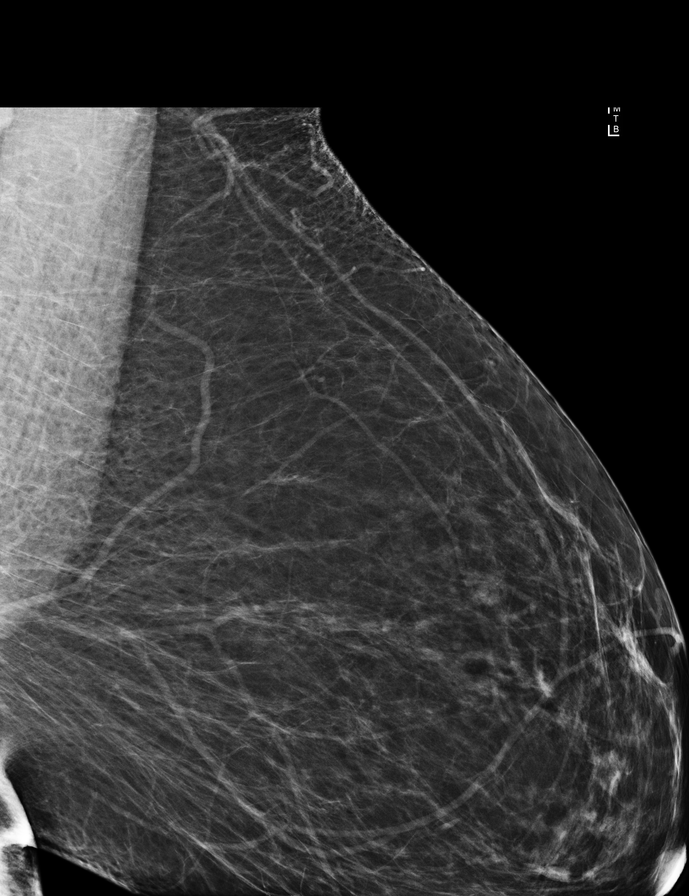

[L MLO (2 of 2)]
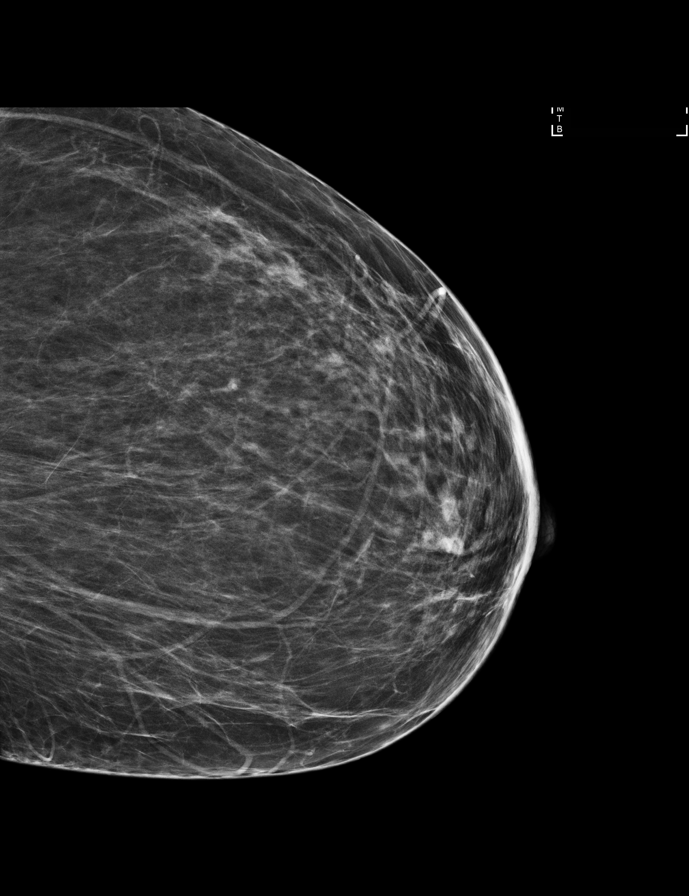

[7 of 7 positions shown; findings below may reference images not displayed]

ACR Breast Density Category b: There are scattered areas of
fibroglandular density.
FINDINGS: There are no findings suspicious for malignancy. Images were
processed with CAD.
IMPRESSION: No mammographic evidence of malignancy. A result letter of this
screening mammogram will be mailed directly to the patient.

RECOMMENDATION:
Screening mammogram in one year. (Code:AS-G-LCT)

BI-RADS CATEGORY  1: Negative.

## 2017-05-26 DIAGNOSIS — S62624A Displaced fracture of medial phalanx of right ring finger, initial encounter for closed fracture: Secondary | ICD-10-CM | POA: Insufficient documentation

## 2017-05-28 NOTE — Progress Notes (Signed)
Cardiology Office Note   Date:  05/29/2017   ID:  [name] [name], DOB 1965-12-25, MRN [id number]  PCP:  Julieanne MansonMulberry, Elizabeth, MD    No chief complaint on file. CAD/MI   Wt Readings from Last 3 Encounters:  05/29/17 192 lb 12.8 oz (87.5 kg)  12/01/16 184 lb (83.5 kg)  10/13/16 185 lb 6.4 oz (84.1 kg)       History of Present Illness: [name] [name] is a 51 y.o. 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 She had a non-STEMI in May of 2014 and had a stent placed in her circumflex. This was a drug-eluting stent. She had several eye surgeries in late 2014 and early 2015.   She had bleeding issues at that time and her Plavix was stopped before she completed her 5912 month course post MI ( May 2014).  She had not been on aspirin due to allergy.  Her vision in her left eye is severely decreased.    Her sister passed away from cancer. She is going through menopause. She has been unable to stop smoking. She is upset about her visual issues. She mentions to me that she is involved in a lawsuit with the ophthalmologist and this is ongoing.  She recently broke her finger.    Denies : Chest pain. Dizziness. Leg edema. Nitroglycerin use. Orthopnea. Palpitations. Paroxysmal nocturnal dyspnea. Shortness of breath. Syncope.   She still is unable to stop smoking.  She can have an ache in her chest when she smokes.  It resolves when she stops.  No chest pain with walking in her house.  That is the most strenuous activity she does.  No recent bleeding issues.      Past Medical History:  Diagnosis Date  . Acute myocardial infarction of other lateral wall, initial episode of care   . Acute myocardial infarction, unspecified site, initial episode of care   . Acute osteomyelitis   . Allergic rhinitis   . Anemia   . Anginal pain (HCC)    07/15/13- no chest pain in months"  . Anxiety   . Bipolar affective (HCC)   . CAD (coronary artery disease) 12/12   s/p DES mid and distal RCA  with 50% LAD  . Daily headache    not daily  . Depression    Bipolar disorder  . Diabetic retinopathy   . Esophageal stenosis   . Esophageal ulcer   . Esophagitis   . Gastroparesis   . Genital herpes    Reportedly tested and documented by [facility]--rare occurrences  . GERD (gastroesophageal reflux disease)   . Heart murmur   . History of stomach ulcers   . Hyperlipidemia   . Hypertension   . Hypertension   . 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   [name]/notes on 12/19/2012, "that's the only one I've had" (12/19/2012)  . Migraines   . Orthopnoea   . Pneumonia 2012  . Polysubstance abuse (HCC)    Crack cocaine--none since 2008, MJ, ETOH:  clean of all since 2008  . Renal insufficiency   . Rheumatoid arthritis(714.0)   . Sebaceous cyst   . Stroke [facility](HCC) 2011   denies residual on 12/19/2012.  "Years ago"  . Type II diabetes mellitus (HCC)    Previously uncontrolled for many years with multiple complications.  2017 controlled. 05/2016:  6.7%    Past Surgical History:  Procedure Laterality Date  . CORONARY ANGIOPLASTY WITH STENT PLACEMENT  01/20/2009   "  2" (12/19/2012)  . CORONARY ANGIOPLASTY WITH STENT PLACEMENT  2012   "2" (12/19/2012)  . CORONARY ANGIOPLASTY WITH STENT PLACEMENT  12/19/2012   "2" (12/19/2012)  . ESOPHAGOGASTRODUODENOSCOPY N/A 02/26/2015   Procedure: ESOPHAGOGASTRODUODENOSCOPY (EGD);  Surgeon: Dorena Cookey, MD;  Location: [facility] ENDOSCOPY;  Service: Endoscopy;  Laterality: N/A;  . EYE SURGERY     Multiple surgeries of both eyes:  last laser was 09/08/2015 of right eye.  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 GAS INSERTION Left 07/16/2013   Procedure: INSERTION OF GAS;  Surgeon: Shade Flood, MD;  Location: [facility] OR;  Service: Ophthalmology;  Laterality: Left;  SF6  . GAS/FLUID EXCHANGE Left 07/30/2013   Procedure: GAS/FLUID EXCHANGE;  Surgeon: Shade Flood, MD;  Location: [facility] OR;  Service: Ophthalmology;  Laterality: Left;  . IRRIGATION AND DEBRIDEMENT  SEBACEOUS CYST Right 03/2011   "pointer" (12/19/2012)  . LEFT HEART CATHETERIZATION WITH CORONARY ANGIOGRAM N/A 07/06/2011   Procedure: LEFT HEART CATHETERIZATION WITH CORONARY ANGIOGRAM;  Surgeon: Corky Crafts, MD;  Location[facility] CATH LAB;  Service: Cardiovascular;  Laterality: N/A;  possible PCI  . LEFT HEART CATHETERIZATION WITH CORONARY ANGIOGRAM N/A 12/19/2012   Procedure: LEFT HEART CATHETERIZATION WITH CORONARY ANGIOGRAM;  Surgeon: Corky Crafts, MD;  Location: [facility] C[facility] LAB;  Service: Cardiovascular;  Laterality: N/A;  . MEMBRANE PEEL Left 07/16/2013   Procedure: MEMBRANE PEEL;  Surgeon: Shade Flood, MD;  Location: [facility] OR;  Service: Ophthalmology;  Laterality: Left;  . PARS PLANA VITRECTOMY Left 07/16/2013   Procedure: PARS PLANA VITRECTOMY WITH 23 GAUGE;  Surgeon: Shade Flood, MD;  Location: [facility] OR;  Service: Ophthalmology;  Laterality: Left;  . PARS PLANA VITRECTOMY Left 07/30/2013   Procedure: PARS PLANA VITRECTOMY WITH 23 GAUGE WITH ENDOLASER;  Surgeon: Shade Flood, MD;  Location: [facility] OR;  Service: Ophthalmology;  Laterality: Left;  with endolaser  . PERCUTANEOUS CORONARY STENT INTERVENTION (PCI-S) N/A 07/06/2011   Procedure: PERCUTANEOUS CORONARY STENT INTERVENTION (PCI-S);  Surgeon: Corky Crafts, MD;  Location: [facility] CATH LAB;  Service: Cardiovascular;  Laterality: N/A;  . PHOTOCOAGULATION WITH LASER Left 07/16/2013   Procedure: PHOTOCOAGULATION WITH LASER;  Surgeon: Shade Flood, MD;  Location: [facility] OR;  Service: Ophthalmology;  Laterality: Left;  ENDOLASER     Current Outpatient Prescriptions  Medication Sig Dispense Refill  . acetaminophen (TYLENOL) 500 MG tablet Take 500-1,000 mg by mouth every 6 (six) hours as needed for moderate pain.     Marland Kitchen albuterol (PROVENTIL HFA;VENTOLIN HFA) 108 (90 Base) MCG/ACT inhaler Inhale 2 puffs into the lungs every 6 (six) hours as needed for wheezing or shortness of breath. 1 Inhaler 0  . atorvastatin (LIPITOR) 40 MG tablet Take 80 mg by  mouth daily.     . cyclobenzaprine (FLEXERIL) 10 MG tablet Take 1-2 tablets by mouth at bedtime as needed. Muscle spasms  5  . ferrous sulfate 325 (65 FE) MG tablet Take 1 tablet (325 mg total) by mouth daily with breakfast. 90 tablet 3  . folic acid (FOLVITE) 1 MG tablet Take 1 mg by mouth daily.    . furosemide (LASIX) 40 MG tablet Take 1 tablet (40 mg total) by mouth daily. In the morning 30 tablet 11  . Incontinence Supply Disposable (POISE ULTRA THINS) PADS Use 5 pads daily as needed for urinary stress incontinence 150 each 11  . insulin aspart (NOVOLOG) 100 UNIT/ML injection Inject 4-16 Units into the skin 3 (three) times daily with meals. Sliding scale CBG 100-150; 4 units,  151-200; 6 units, 201-250; 8 units, 251-300; 10 units, 301-350; 12 units, 351-400; 14 units, > 401; call doctor    . insulin glargine (LANTUS) 100 UNIT/ML injection Inject 16 Units into the skin 2 (two) times daily.     . methotrexate (RHEUMATREX) 2.5 MG tablet Take 4 tablets (10 mg total) by mouth 2 (two) times a week. Caution:Chemotherapy. Protect from light. 40 tablet 1  . metoCLOPramide (REGLAN) 10 MG tablet Take 0.5 tablets (5 mg total) by mouth 4 (four) times daily -  before meals and at bedtime. (Patient taking differently: Take 10 mg by mouth 4 (four) times daily -  before meals and at bedtime. ) 60 tablet 11  . metoprolol tartrate (LOPRESSOR) 25 MG tablet TAKE 1 TABLET (25 MG TOTAL) BY MOUTH 2 (TWO) TIMES DAILY. 60 tablet 11  . nitroGLYCERIN (NITROSTAT) 0.4 MG SL tablet Place 1 tablet (0.4 mg total) under the tongue every 5 (five) minutes as needed for chest pain. 24 tablet 1  . pantoprazole (PROTONIX) 40 MG tablet TAKE 1 TABLET (40 MG TOTAL) BY MOUTH 2 (TWO) TIMES DAILY. 60 tablet 11  . prednisoLONE acetate (PRED FORTE) 1 % ophthalmic suspension Place 4-5 drops into the left eye daily.  6  . sertraline (ZOLOFT) 100 MG tablet 1 tab by mouth daily 45 tablet 11  . traMADol (ULTRAM) 50 MG tablet TAKE 1 TABLET EVERY 6  HOURS AS NEEDED FOR MODERATE PAIN 90 tablet 2   No current facility-administered medications for this visit.     Allergies:   Aspirin    Social History:  The patient  reports that she has been smoking Cigarettes.  She has a 11.00 pack-year smoking history. She has never used smokeless tobacco. She reports that she drinks alcohol. She reports that she does not use drugs.   Family History:  The patient's family history includes Breast cancer (age of onset: 52) in her mother; Cancer in her sister; Hypertension in her father and sister; Lung cancer in her sister; Stomach cancer in her sister.    ROS:  Please see the history of present illness.   Otherwise, review of systems are positive for visual changes; feels poorly for the last week since the flu shot.   All other systems are reviewed and negative.    PHYSICAL EXAM: VS:  BP 112/68   Pulse 77   Ht 5' (1.524 m)   Wt 192 lb 12.8 oz (87.5 kg)   LMP 01/12/2015   SpO2 97%   BMI 37.65 kg/m  , BMI Body mass index is 37.65 kg/m. GEN: Well nourished, well developed, in no acute distress  HEENT: Left eye partially closed Neck: no JVD, carotid bruits, or masses Cardiac: RRR; no murmurs, rubs, or gallops,no edema  Respiratory:  clear to auscultation bilaterally, normal work of breathing GI: soft, nontender, nondistended, + BS MS: no deformity or atrophy  Skin: warm and dry, no rash Neuro:  Strength and sensation are intact Psych: euthymic mood, full affect   EKG:   The ekg ordered in March 2018 demonstrates normal sinus rhythm, inferior ST changes   Recent Labs: 10/13/2016: ALT 9; BUN 15; Creatinine, Ser 0.91; Potassium 4.5; Sodium 142   Lipid Panel    Component Value Date/Time   CHOL 182 10/13/2016 0845   TRIG 82 10/13/2016 0845   HDL 69 10/13/2016 0845   CHOLHDL 2.6 10/13/2016 0845   CHOLHDL 3 10/14/2014 0835   VLDL 14.2 10/14/2014 0835   LDLCALC 97 10/13/2016 0845  Other studies Reviewed: Additional studies/ records  that were reviewed today with results demonstrating: 2010 cath report reviewed.  Bare-metal stent to the RCA.  Labs from March 2018 also reviewed.  Hemoglobin A1c 7.5 in August 2018.  She states this is come down since then.   ASSESSMENT AND PLAN:  1. CAD: multiple PCI.  No CHF symptoms.  No angina on medical therapy.  Continue aggressive secondary prevention.  Occasional nitroglycerin use.  She needs to stop smoking. 2. Hypertensive heart disease: Blood pressure control.  Continue current medications. 3. Tobacco abuse: Smoking causes some ache in her chest.  It resolves when she stops smoking.  She knows she needs to stop. 4. Hyperlipidemia: Continue atorvastatin. 5. CRI: Renal function in March 2018 stable.  Creatinine 0.91   Current medicines are reviewed at length with the patient today.  The patient concerns regarding her medicines were addressed.  The following changes have been made:  No change  Labs/ tests ordered today include:  No orders of the defined types were placed in this encounter.   Recommend 150 minutes/week of aerobic exercise Low fat, low carb, Jafari fiber diet recommended  Disposition:   FU in 1 year   Signed, Lance Muss, MD  05/29/2017 9:06 AM    [facility] [address] [place], [place], [place]  [address] Phone: [phone]; Fax: [phone]

## 2017-05-29 ENCOUNTER — Encounter: Payer: Self-pay | Admitting: Interventional Cardiology

## 2017-05-29 ENCOUNTER — Encounter (INDEPENDENT_AMBULATORY_CARE_PROVIDER_SITE_OTHER): Payer: Self-pay

## 2017-05-29 ENCOUNTER — Ambulatory Visit (INDEPENDENT_AMBULATORY_CARE_PROVIDER_SITE_OTHER): Payer: Medicaid Other | Admitting: Interventional Cardiology

## 2017-05-29 VITALS — BP 112/68 | HR 77 | Ht 60.0 in | Wt 192.8 lb

## 2017-05-29 DIAGNOSIS — G4700 Insomnia, unspecified: Secondary | ICD-10-CM | POA: Insufficient documentation

## 2017-05-29 DIAGNOSIS — Z720 Tobacco use: Secondary | ICD-10-CM | POA: Diagnosis not present

## 2017-05-29 DIAGNOSIS — I130 Hypertensive heart and chronic kidney disease with heart failure and stage 1 through stage 4 chronic kidney disease, or unspecified chronic kidney disease: Secondary | ICD-10-CM

## 2017-05-29 DIAGNOSIS — I252 Old myocardial infarction: Secondary | ICD-10-CM | POA: Diagnosis not present

## 2017-05-29 DIAGNOSIS — M797 Fibromyalgia: Secondary | ICD-10-CM | POA: Insufficient documentation

## 2017-05-29 DIAGNOSIS — I25119 Atherosclerotic heart disease of native coronary artery with unspecified angina pectoris: Secondary | ICD-10-CM | POA: Diagnosis not present

## 2017-05-29 DIAGNOSIS — S4990XA Unspecified injury of shoulder and upper arm, unspecified arm, initial encounter: Secondary | ICD-10-CM | POA: Insufficient documentation

## 2017-05-29 MED ORDER — NITROGLYCERIN 0.4 MG SL SUBL: 0.4000 mg | SUBLINGUAL_TABLET | SUBLINGUAL | 3 refills | Status: DC | PRN

## 2017-05-29 NOTE — Patient Instructions (Signed)

## 2017-07-14 ENCOUNTER — Other Ambulatory Visit: Payer: Self-pay | Admitting: Internal Medicine

## 2017-07-15 ENCOUNTER — Other Ambulatory Visit: Payer: Self-pay | Admitting: Internal Medicine

## 2017-07-15 DIAGNOSIS — I10 Essential (primary) hypertension: Secondary | ICD-10-CM

## 2017-08-29 ENCOUNTER — Telehealth: Payer: Self-pay | Admitting: Internal Medicine

## 2017-08-29 MED ORDER — METOPROLOL TARTRATE 25 MG PO TABS: 25.0000 mg | ORAL_TABLET | Freq: Two times a day (BID) | ORAL | 11 refills | Status: DC

## 2017-08-29 MED ORDER — PANTOPRAZOLE SODIUM 40 MG PO TBEC: 40.0000 mg | DELAYED_RELEASE_TABLET | Freq: Two times a day (BID) | ORAL | 11 refills | Status: DC

## 2017-08-29 NOTE — Telephone Encounter (Signed)
Patient would like refill of metoprolol tartrate (LOPRESSOR) 25 mg tablet, pantoprazole (PROTONIX) 40 mg tablet.  Patient is out of Rx and would like to take her meds this morning.

## 2017-08-29 NOTE — Telephone Encounter (Signed)
Patient needs refill of  °

## 2017-08-29 NOTE — Telephone Encounter (Signed)
RX sent to pharmacy  

## 2017-09-07 ENCOUNTER — Other Ambulatory Visit: Payer: Self-pay | Admitting: Internal Medicine

## 2017-09-07 DIAGNOSIS — Z139 Encounter for screening, unspecified: Secondary | ICD-10-CM

## 2017-10-22 ENCOUNTER — Ambulatory Visit: Payer: Medicaid Other

## 2017-10-29 ENCOUNTER — Ambulatory Visit
Admission: RE | Admit: 2017-10-29 | Discharge: 2017-10-29 | Disposition: A | Discharge status: Home / Self Care | Payer: Medicaid Other | Source: Ambulatory Visit | Attending: Internal Medicine | Admitting: Internal Medicine

## 2017-10-29 DIAGNOSIS — Z139 Encounter for screening, unspecified: Secondary | ICD-10-CM

## 2017-11-19 ENCOUNTER — Telehealth: Payer: Self-pay

## 2017-11-19 NOTE — Telephone Encounter (Signed)
Patient called asking for a referral to Dr. Marca Ancona. States she needs routine check up from gastro. Patient states she has had issues in the past and wants to make sure everything is okay.  To Dr. Delrae Alfred for further direction.

## 2017-11-20 NOTE — Telephone Encounter (Signed)
Appointment scheduled.

## 2017-11-20 NOTE — Telephone Encounter (Signed)
She needs a visit with me to discuss what her needs are.  She has not been seen here in almost 1 year.  Previously, had EGD done by Dr. Madilyn Fireman.   She is also due for a screening colonoscopy

## 2017-11-20 NOTE — Telephone Encounter (Signed)
Please call patient to schedule appointment.

## 2017-11-21 ENCOUNTER — Encounter: Payer: Self-pay | Admitting: Internal Medicine

## 2017-11-21 ENCOUNTER — Ambulatory Visit: Payer: Medicaid Other | Admitting: Internal Medicine

## 2017-11-21 VITALS — BP 142/80 | HR 78 | Resp 12 | Ht 61.0 in | Wt 184.0 lb

## 2017-11-21 DIAGNOSIS — Z720 Tobacco use: Secondary | ICD-10-CM | POA: Diagnosis not present

## 2017-11-21 DIAGNOSIS — I10 Essential (primary) hypertension: Secondary | ICD-10-CM

## 2017-11-21 DIAGNOSIS — F329 Major depressive disorder, single episode, unspecified: Secondary | ICD-10-CM

## 2017-11-21 DIAGNOSIS — K2210 Ulcer of esophagus without bleeding: Secondary | ICD-10-CM

## 2017-11-21 DIAGNOSIS — E669 Obesity, unspecified: Secondary | ICD-10-CM | POA: Diagnosis not present

## 2017-11-21 DIAGNOSIS — J302 Other seasonal allergic rhinitis: Secondary | ICD-10-CM

## 2017-11-21 DIAGNOSIS — K222 Esophageal obstruction: Secondary | ICD-10-CM

## 2017-11-21 MED ORDER — FEXOFENADINE HCL 180 MG PO TABS: 180.0000 mg | ORAL_TABLET | Freq: Every day | ORAL | 11 refills | Status: DC

## 2017-11-21 MED ORDER — NICOTINE 14 MG/24HR TD PT24: MEDICATED_PATCH | TRANSDERMAL | 0 refills | Status: DC

## 2017-11-21 MED ORDER — NICOTINE 7 MG/24HR TD PT24: MEDICATED_PATCH | TRANSDERMAL | 0 refills | Status: DC

## 2017-11-21 NOTE — Progress Notes (Signed)
Subjective:    Patient ID: [name] [name], female    DOB: [birth year], 52 y.o.   MRN: [id number]  HPI   Here after long hiatus.  1.  History of esophagitis:  Dr. Madilyn Fireman, GI has apparently retired, for which patient is quite upset as they had a good relationship.   Dr. Marca Ancona is reportedly taking over her care. Needs formal referral to her, however. She is having some swelling sensation into her throat. Taking Metoclopramide and Protonix regularly. Hard to swallow meat, etc. Half pack daily smoking:  Would be willing to try nicotine patch again. HOB not elevated. Does not lie down after eating. No caffeine.  Patient tearful, stating she has developed good relationships over last several years with her providers and feels they are all leaving her now.  Tearful.   2.  Allergies:  Nasal and maxillary sinus area swollen.  Sneezing, runny nose. Itchy, watery eyes. Throat hurts.  Coughing.  Still smoking as above.  Feels she is wheezing at times as well and Proair helps.  3.  DM:  Has been well controlled with Dr. Sharl Ma, endocrinology.  Last A1C was 7.1%  4.  CAD:  Sees her cardiologist on Monday of next week.  Current Meds  Medication Sig  . acetaminophen (TYLENOL) 500 MG tablet Take 500-1,000 mg by mouth every 6 (six) hours as needed for moderate pain.   Marland Kitchen albuterol (PROVENTIL HFA;VENTOLIN HFA) 108 (90 Base) MCG/ACT inhaler Inhale 2 puffs into the lungs every 6 (six) hours as needed for wheezing or shortness of breath.  Marland Kitchen atorvastatin (LIPITOR) 40 MG tablet TAKE 2 TABLETS BY MOUTH EVERY DAY WITH EVENING MEAL  . cyclobenzaprine (FLEXERIL) 10 MG tablet Take 1-2 tablets by mouth at bedtime as needed. Muscle spasms  . ferrous sulfate 325 (65 FE) MG tablet Take 1 tablet (325 mg total) by mouth daily with breakfast.  . folic acid (FOLVITE) 1 MG tablet Take 1 mg by mouth daily.  . furosemide (LASIX) 40 MG tablet TAKE 1 TABLET BY MOUTH EVERY MORNING  . Incontinence Supply Disposable (POISE ULTRA  THINS) PADS Use 5 pads daily as needed for urinary stress incontinence  . insulin aspart (NOVOLOG) 100 UNIT/ML injection Inject 4-16 Units into the skin 3 (three) times daily with meals. Sliding scale CBG 100-150; 4 units, 151-200; 6 units, 201-250; 8 units, 251-300; 10 units, 301-350; 12 units, 351-400; 14 units, > 401; call doctor  . insulin glargine (LANTUS) 100 UNIT/ML injection Inject 10 Units into the skin 2 (two) times daily.   . methotrexate (RHEUMATREX) 2.5 MG tablet Take 4 tablets (10 mg total) by mouth 2 (two) times a week. Caution:Chemotherapy. Protect from light.  . metoCLOPramide (REGLAN) 10 MG tablet Take 0.5 tablets (5 mg total) by mouth 4 (four) times daily -  before meals and at bedtime. (Patient taking differently: Take 10 mg by mouth 4 (four) times daily -  before meals and at bedtime. )  . metoprolol tartrate (LOPRESSOR) 25 MG tablet Take 1 tablet (25 mg total) by mouth 2 (two) times daily.  . pantoprazole (PROTONIX) 40 MG tablet Take 1 tablet (40 mg total) by mouth 2 (two) times daily.  . prednisoLONE acetate (PRED FORTE) 1 % ophthalmic suspension Place 4-5 drops into the left eye daily.  . sertraline (ZOLOFT) 100 MG tablet 1 tab by mouth daily  . traMADol (ULTRAM) 50 MG tablet TAKE 1 TABLET EVERY 6 HOURS AS NEEDED FOR MODERATE PAIN    Allergies  Allergen Reactions  .  Aspirin Other (See Comments)    Stomach bleeds      Review of Systems     Objective:   Physical Exam HEENT:  Right pupil round and reactive to light.  Left eye with corneal scarring and small.   Throat without injection.  TMs pearly gray.  Nasal mucosa swollen/boggy with clear discharge. Neck:  Supple, No adenopathy Chest:  CTA CV:  RRR with normal S1 and S2, No S3, S4 or murmur.  Radial and DP pulses normal and equal. Abd:  S, NT, No HSM or mass, + BS LE: No edema      Assessment & Plan:  1.  GERD with history of esophageal stenosis and esophageal ulcer as well as Gastroparesis:  Dysphagia.   Will be seen by Dr. Marca Ancona, GI in near future. Formal referral written  2.  DM:  Relatively well controlled.  3.  CAD:  Stable.  4.  Allergies:  Fexofenadine 180 mg daily as needed.  5.  Tobacco Abuse:  Discussed at length.  Quit [employer].   Nicotine patches to start at 14 mg daily for 28 days, then wean to 7 mg daily for 14 days.  Information given on support.  6.  Stress of losing long term providers:  Referral to Samul Dada, LCSW, who can hopefully work with her on smoking cessation as well.

## 2017-11-21 NOTE — Patient Instructions (Signed)
Tobacco Cessation:   1800QUITNOW or [phone], the former for support and possibly free nicotine patches/gum and support; the latter for [facility] Smoking cessation class. Get rid of all smoking supplies:  Cigarettes, lighters, ashtrays--no stashes just in case at home if you are serious.For nicotine patches:  Stop smoking anything the day you start the first patch Start with 14 mg patch and reapply new to different area of skin every 24 hours for 28 days. Then 7 mg patch changed every 24 hours for 14 days.

## 2017-11-23 NOTE — Progress Notes (Signed)
Referral, demographics and OV notes faxed to Dr. Laurey Morale office. patient already scheduled for appointment in may.

## 2017-11-25 NOTE — Progress Notes (Deleted)
Cardiology Office Note   Date:  11/25/2017   ID:  [name] [name], DOB 27-Mar-1966, MRN [id number]  PCP:  Julieanne Manson, MD    No chief complaint on file.    Wt Readings from Last 3 Encounters:  11/21/17 184 lb (83.5 kg)  05/29/17 192 lb 12.8 oz (87.5 kg)  12/01/16 184 lb (83.5 kg)       History of Present Illness: [name] [name] is a 52 y.o. 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 She had a non-STEMI in May of 2014 and had a stent placed in her circumflex. This was a drug-eluting stent. She had several eye surgeries in late 2014 and early 2015. She had bleeding issues at that time and her Plavix was stopped before she completed her 68 month course post MI ( May 2014). She had not been on aspirin due to allergy. Her vision in her left eye is severely decreased.   Her sister passed away from cancer. She is going through menopause. She has been unable to stop smoking. She is upset about her visual issues. She mentions to me that she is involved in a lawsuit with the ophthalmologist and this is ongoing.     Past Medical History:  Diagnosis Date  . Acute myocardial infarction of other lateral wall, initial episode of care   . Acute myocardial infarction, unspecified site, initial episode of care   . Acute osteomyelitis   . Allergic rhinitis   . Anemia   . Anginal pain (HCC)    07/15/13- no chest pain in months"  . Anxiety   . Bipolar affective (HCC)   . CAD (coronary artery disease) 12/12   s/p DES mid and distal RCA with 50% LAD  . Daily headache    not daily  . Depression    Bipolar disorder  . Diabetic retinopathy   . Esophageal stenosis   . Esophageal ulcer   . Esophagitis   . Gastroparesis   . Genital herpes    Reportedly tested and documented by [facility]--rare occurrences  . GERD (gastroesophageal reflux disease)   . Heart murmur   . History of stomach ulcers   . Hyperlipidemia   . Hypertension   . Hypertension   .  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 Migraines   . Orthopnoea   . Pneumonia 2012  . Polysubstance abuse (HCC)    Crack cocaine--none since 2008, MJ, ETOH:  clean of all since 2008  . Renal insufficiency   . Rheumatoid arthritis(714.0)   . Sebaceous cyst   . Stroke [facility]) 2011   denies residual on 12/19/2012.  "Years ago"  . Type II diabetes mellitus (HCC)    Previously uncontrolled for many years with multiple complications.  2017 controlled. 05/2016:  6.7%    Past Surgical History:  Procedure Laterality Date  . CORONARY ANGIOPLASTY WITH STENT PLACEMENT  01/20/2009   "2" (12/19/2012)  . CORONARY ANGIOPLASTY WITH STENT PLACEMENT  2012   "2" (12/19/2012)  . CORONARY ANGIOPLASTY WITH STENT PLACEMENT  12/19/2012   "2" (12/19/2012)  . ESOPHAGOGASTRODUODENOSCOPY N/A 02/26/2015   Procedure: ESOPHAGOGASTRODUODENOSCOPY (EGD);  Surgeon: Dorena Cookey, MD;  Location: [facility] ENDOSCOPY;  Service: Endoscopy;  Laterality: N/A;  . EYE SURGERY     Multiple surgeries of both eyes:  last laser was 09/08/2015 of right eye.  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  GAS INSERTION Left 07/16/2013   Procedure: INSERTION OF GAS;  Surgeon: Shade Flood, MD;  Location: [facility] OR;  Service: Ophthalmology;  Laterality: Left;  SF6  . GAS/FLUID EXCHANGE Left 07/30/2013   Procedure: GAS/FLUID EXCHANGE;  Surgeon: Shade Flood, MD;  Location: [facility] OR;  Service: Ophthalmology;  Laterality: Left;  . IRRIGATION AND DEBRIDEMENT SEBACEOUS CYST Right 03/2011   "pointer" (12/19/2012)  . LEFT HEART CATHETERIZATION WITH CORONARY ANGIOGRAM N/A 07/06/2011   Procedure: LEFT HEART CATHETERIZATION WITH CORONARY ANGIOGRAM;  Surgeon: Corky Crafts, MD;  Location: [facility] CATH LAB;  Service: Cardiovascular;  Laterality: N/A;  possible PCI  . LEFT HEART CATHETERIZATION WITH CORONARY ANGIOGRAM N/A 12/19/2012   Procedure: LEFT HEART CATHETERIZATION WITH CORONARY ANGIOGRAM;   Surgeon: Corky Crafts, MD;  Location: [facility] CATH LAB;  Service: Cardiovascular;  Laterality: N/A;  . MEMBRANE PEEL Left 07/16/2013   Procedure: MEMBRANE PEEL;  Surgeon: Shade Flood, MD;  Location: [facility] OR;  Service: Ophthalmology;  Laterality: Left;  . PARS PLANA VITRECTOMY Left 07/16/2013   Procedure: PARS PLANA VITRECTOMY WITH 23 GAUGE;  Surgeon: Shade Flood, MD;  Location[facility] OR;  Service: Ophthalmology;  Laterality: Left;  . PARS PLANA VITRECTOMY Left 07/30/2013   Procedure: PARS PLANA VITRECTOMY WITH 23 GAUGE WITH ENDOLASER;  Surgeon: Shade Flood, MD;  Location: [facility] OR;  Service: Ophthalmology;  Laterality: Left;  with endolaser  . PERCUTANEOUS CORONARY STENT INTERVENTION (PCI-S) N/A 07/06/2011   Procedure: PERCUTANEOUS CORONARY STENT INTERVENTION (PCI-S);  Surgeon: Corky Crafts, MD;  Location: [facility] CATH LAB;  Service: Cardiovascular;  Laterality: N/A;  . PHOTOCOAGULATION WITH LASER Left 07/16/2013   Procedure: PHOTOCOAGULATION WITH LASER;  Surgeon: Shade Flood, MD;  Location: [facility] OR;  Service: Ophthalmology;  Laterality: Left;  ENDOLASER     Current Outpatient Medications  Medication Sig Dispense Refill  . acetaminophen (TYLENOL) 500 MG tablet Take 500-1,000 mg by mouth every 6 (six) hours as needed for moderate pain.     Marland Kitchen albuterol (PROVENTIL HFA;VENTOLIN HFA) 108 (90 Base) MCG/ACT inhaler Inhale 2 puffs into the lungs every 6 (six) hours as needed for wheezing or shortness of breath. 1 Inhaler 0  . atorvastatin (LIPITOR) 40 MG tablet TAKE 2 TABLETS BY MOUTH EVERY DAY WITH EVENING MEAL 60 tablet 11  . cyclobenzaprine (FLEXERIL) 10 MG tablet Take 1-2 tablets by mouth at bedtime as needed. Muscle spasms  5  . ferrous sulfate 325 (65 FE) MG tablet Take 1 tablet (325 mg total) by mouth daily with breakfast. 90 tablet 3  . fexofenadine (ALLEGRA) 180 MG tablet Take 1 tablet (180 mg total) by mouth daily. 30 tablet 11  . folic acid (FOLVITE) 1 MG tablet Take 1 mg by mouth daily.    .  furosemide (LASIX) 40 MG tablet TAKE 1 TABLET BY MOUTH EVERY MORNING 30 tablet 11  . Incontinence Supply Disposable (POISE ULTRA THINS) PADS Use 5 pads daily as needed for urinary stress incontinence 150 each 11  . insulin aspart (NOVOLOG) 100 UNIT/ML injection Inject 4-16 Units into the skin 3 (three) times daily with meals. Sliding scale CBG 100-150; 4 units, 151-200; 6 units, 201-250; 8 units, 251-300; 10 units, 301-350; 12 units, 351-400; 14 units, > 401; call doctor    . insulin glargine (LANTUS) 100 UNIT/ML injection Inject 10 Units into the skin 2 (two) times daily.     . methotrexate (RHEUMATREX) 2.5 MG tablet Take 4 tablets (10 mg total) by mouth 2 (two) times a week. Caution:Chemotherapy. Protect from light. 40 tablet 1  .  metoCLOPramide (REGLAN) 10 MG tablet Take 0.5 tablets (5 mg total) by mouth 4 (four) times daily -  before meals and at bedtime. (Patient taking differently: Take 10 mg by mouth 4 (four) times daily -  before meals and at bedtime. ) 60 tablet 11  . metoprolol tartrate (LOPRESSOR) 25 MG tablet Take 1 tablet (25 mg total) by mouth 2 (two) times daily. 60 tablet 11  . nicotine (NICODERM CQ - DOSED IN MG/24 HOURS) 14 mg/24hr patch Apply new patch to skin every 24 hours for 28 days. 28 patch 0  . nicotine (NICODERM CQ - DOSED IN MG/24 HR) 7 mg/24hr patch Apply new patch to skin every 24 hours after finish 14 mg patches. 7 patch 0  . nitroGLYCERIN (NITROSTAT) 0.4 MG SL tablet Place 1 tablet (0.4 mg total) under the tongue every 5 (five) minutes as needed for chest pain. (Patient not taking: Reported on 11/21/2017) 25 tablet 3  . pantoprazole (PROTONIX) 40 MG tablet Take 1 tablet (40 mg total) by mouth 2 (two) times daily. 60 tablet 11  . prednisoLONE acetate (PRED FORTE) 1 % ophthalmic suspension Place 4-5 drops into the left eye daily.  6  . sertraline (ZOLOFT) 100 MG tablet 1 tab by mouth daily 45 tablet 11  . traMADol (ULTRAM) 50 MG tablet TAKE 1 TABLET EVERY 6 HOURS AS NEEDED  FOR MODERATE PAIN 90 tablet 2   No current facility-administered medications for this visit.     Allergies:   Aspirin    Social History:  The patient  reports that she has been smoking cigarettes.  She has a 11.00 pack-year smoking history. She has never used smokeless tobacco. She reports that she drinks alcohol. She reports that she does not use drugs.   Family History:  The patient's ***family history includes Breast cancer (age of onset: 103) in her mother; Cancer in her sister; Hypertension in her father and sister; Lung cancer in her sister; Stomach cancer in her sister.    ROS:  Please see the history of present illness.   Otherwise, review of systems are positive for ***.   All other systems are reviewed and negative.    PHYSICAL EXAM: VS:  LMP 01/12/2015  , BMI There is no height or weight on file to calculate BMI. GEN: Well nourished, well developed, in no acute distress  HEENT: normal  Neck: no JVD, carotid bruits, or masses Cardiac: ***RRR; no murmurs, rubs, or gallops,no edema  Respiratory:  clear to auscultation bilaterally, normal work of breathing GI: soft, nontender, nondistended, + BS MS: no deformity or atrophy  Skin: warm and dry, no rash Neuro:  Strength and sensation are intact Psych: euthymic mood, full affect   EKG:   The ekg ordered today demonstrates ***   Recent Labs: No results found for requested labs within last 8760 hours.   Lipid Panel    Component Value Date/Time   CHOL 182 10/13/2016 0845   TRIG 82 10/13/2016 0845   HDL 69 10/13/2016 0845   CHOLHDL 2.6 10/13/2016 0845   CHOLHDL 3 10/14/2014 0835   VLDL 14.2 10/14/2014 0835   LDLCALC 97 10/13/2016 0845     Other studies Reviewed: Additional studies/ records that were reviewed today with results demonstrating: ***.   ASSESSMENT AND PLAN:  1. CAD/Old MI : 2. DM: 3. Hyperlipidemia: 4. HTN:   Current medicines are reviewed at length with the patient today.  The patient concerns  regarding her medicines were addressed.  The following changes  have been made:  No change***  Labs/ tests ordered today include: *** No orders of the defined types were placed in this encounter.   Recommend 150 minutes/week of aerobic exercise Low fat, low carb, [place] fiber diet recommended  Disposition:   FU in ***   Signed, Lance Muss, MD  11/25/2017 11:13 PM    [facility] [address] [place], [place], [place]  [address] Phone: [phone]; Fax: [phone]

## 2017-11-26 ENCOUNTER — Ambulatory Visit: Payer: Medicaid Other | Admitting: Interventional Cardiology

## 2017-11-27 ENCOUNTER — Ambulatory Visit (HOSPITAL_COMMUNITY)
Admission: RE | Admit: 2017-11-27 | Discharge: 2017-11-27 | Disposition: A | Discharge status: Home / Self Care | Payer: Medicaid Other | Source: Ambulatory Visit | Attending: Interventional Cardiology | Admitting: Interventional Cardiology

## 2017-11-27 ENCOUNTER — Ambulatory Visit: Payer: Medicaid Other | Admitting: Interventional Cardiology

## 2017-11-27 ENCOUNTER — Encounter: Payer: Self-pay | Admitting: Interventional Cardiology

## 2017-11-27 VITALS — BP 112/74 | HR 84 | Ht 61.0 in | Wt 182.4 lb

## 2017-11-27 DIAGNOSIS — R079 Chest pain, unspecified: Secondary | ICD-10-CM | POA: Insufficient documentation

## 2017-11-27 DIAGNOSIS — I10 Essential (primary) hypertension: Secondary | ICD-10-CM

## 2017-11-27 DIAGNOSIS — Z955 Presence of coronary angioplasty implant and graft: Secondary | ICD-10-CM | POA: Diagnosis not present

## 2017-11-27 DIAGNOSIS — W19XXXA Unspecified fall, initial encounter: Secondary | ICD-10-CM | POA: Insufficient documentation

## 2017-11-27 DIAGNOSIS — E1159 Type 2 diabetes mellitus with other circulatory complications: Secondary | ICD-10-CM | POA: Diagnosis not present

## 2017-11-27 DIAGNOSIS — I25119 Atherosclerotic heart disease of native coronary artery with unspecified angina pectoris: Secondary | ICD-10-CM

## 2017-11-27 DIAGNOSIS — E785 Hyperlipidemia, unspecified: Secondary | ICD-10-CM

## 2017-11-27 MED ORDER — CLOPIDOGREL BISULFATE 75 MG PO TABS: 75.0000 mg | ORAL_TABLET | Freq: Every day | ORAL | 3 refills | Status: DC

## 2017-11-27 MED ORDER — ROSUVASTATIN CALCIUM 40 MG PO TABS: 40.0000 mg | ORAL_TABLET | Freq: Every day | ORAL | 3 refills | Status: DC

## 2017-11-27 NOTE — Patient Instructions (Addendum)
Medication Instructions:  Your physician has recommended you make the following change in your medication:   1. STOP: atorvastatin (lipitor)  2. START: rosuvastatin (crestor) 40 mg tablet: Take 1 tablet (40 mg total) once daily  3. RESTART: clopidogrel (plavix) 75 mg tablet: Take 1 tablet once daily  Labwork: Your physician recommends that you return for a FASTING lipid profile and liver function panel in 3 months   Testing/Procedures: A chest x-ray takes a picture of the organs and structures inside the chest, including the heart, lungs, and blood vessels. This test can show several things, including, whether the heart is enlarges; whether fluid is building up in the lungs; and whether pacemaker / defibrillator leads are still in place.   Follow-Up: Your physician wants you to follow-up in: 6 months with Dr. Eldridge Dace. You will receive a reminder letter in the mail two months in advance. If you don't receive a letter, please call our office to schedule the follow-up appointment.   Any Other Special Instructions Will Be Listed Below (If Applicable).     If you need a refill on your cardiac medications before your next appointment, please call your pharmacy.

## 2017-11-27 NOTE — Progress Notes (Signed)
Cardiology Office Note   Date:  11/27/2017   ID:  [name] [name], DOB Sep 05, 1965, MRN [id number]  PCP:  Julieanne Manson, MD    No chief complaint on file.  CAD  Wt Readings from Last 3 Encounters:  11/27/17 182 lb 6.4 oz (82.7 kg)  11/21/17 184 lb (83.5 kg)  05/29/17 192 lb 12.8 oz (87.5 kg)       History of Present Illness: [name] [name] is a 52 y.o. 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 She had a non-STEMI in May of 2014 and had a stent placed in her circumflex. This was a drug-eluting stent. She had several eye surgeries in late 2014 and early 2015. She had bleeding issues at that time and her Plavix was stopped before she completed her 38 month course post MI ( May 2014). She had not been on aspirin due to allergy. Her vision in her left eye is severely decreased.   Her sister passed away from cancer. She is going through menopause. She has been unable to stop smoking. She is upset about her visual issues. She mentioned to me that she is involved in a lawsuit with the ophthalmologist and this is ongoing.  Since the last viist, she had a fall a few days ago.  She thinks that she may have broken some ribs.    Denies :  Dizziness. Leg edema.  Orthopnea. Palpitations. Paroxysmal nocturnal dyspnea. Shortness of breath. Syncope.   Currently, her biggest complaint is the pain that she has in the left lower chest where she fell.   Past Medical History:  Diagnosis Date  . Acute myocardial infarction of other lateral wall, initial episode of care   . Acute myocardial infarction, unspecified site, initial episode of care   . Acute osteomyelitis   . Allergic rhinitis   . Anemia   . Anginal pain (HCC)    07/15/13- no chest pain in months"  . Anxiety   . Bipolar affective (HCC)   . CAD (coronary artery disease) 12/12   s/p DES mid and distal RCA with 50% LAD  . Daily headache    not daily  . Depression    Bipolar disorder  . Diabetic  retinopathy   . Esophageal stenosis   . Esophageal ulcer   . Esophagitis   . Gastroparesis   . Genital herpes    Reportedly tested and documented by [facility]--rare occurrences  . GERD (gastroesophageal reflux disease)   . Heart murmur   . History of stomach ulcers   . Hyperlipidemia   . Hypertension   . Hypertension   . 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 Migraines   . Orthopnoea   . Pneumonia 2012  . Polysubstance abuse (HCC)    Crack cocaine--none since 2008, MJ, ETOH:  clean of all since 2008  . Renal insufficiency   . Rheumatoid arthritis(714.0)   . Sebaceous cyst   . Stroke [facility]) 2011   denies residual on 12/19/2012.  "Years ago"  . Type II diabetes mellitus (HCC)    Previously uncontrolled for many years with multiple complications.  2017 controlled. 05/2016:  6.7%    Past Surgical History:  Procedure Laterality Date  . CORONARY ANGIOPLASTY WITH STENT PLACEMENT  01/20/2009   "2" (12/19/2012)  . CORONARY ANGIOPLASTY WITH STENT PLACEMENT  2012   "2" (12/19/2012)  . CORONARY ANGIOPLASTY WITH STENT PLACEMENT  12/19/2012   "2" (12/19/2012)  . ESOPHAGOGASTRODUODENOSCOPY N/A 02/26/2015   Procedure: ESOPHAGOGASTRODUODENOSCOPY (EGD);  Surgeon: Dorena Cookey, MD;  Location: [facility] ENDOSCOPY;  Service: Endoscopy;  Laterality: N/A;  . EYE SURGERY     Multiple surgeries of both eyes:  last laser was 09/08/2015 of right eye.  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 GAS INSERTION Left 07/16/2013   Procedure: INSERTION OF GAS;  Surgeon: Shade Flood, MD;  Location: [facility] OR;  Service: Ophthalmology;  Laterality: Left;  SF6  . GAS/FLUID EXCHANGE Left 07/30/2013   Procedure: GAS/FLUID EXCHANGE;  Surgeon: Shade Flood, MD;  Location: [facility] OR;  Service: Ophthalmology;  Laterality: Left;  . IRRIGATION AND DEBRIDEMENT SEBACEOUS CYST Right 03/2011   "pointer" (12/19/2012)  . LEFT HEART CATHETERIZATION WITH CORONARY  ANGIOGRAM N/A 07/06/2011   Procedure: LEFT HEART CATHETERIZATION WITH CORONARY ANGIOGRAM;  Surgeon: Corky Crafts, MD;  Location: [facility] CATH LAB;  Service: Cardiovascular;  Laterality: N/A;  possible PCI  . LEFT HEART CATHETERIZATION WITH CORONARY ANGIOGRAM N/A 12/19/2012   Procedure: LEFT HEART CATHETERIZATION WITH CORONARY ANGIOGRAM;  Surgeon: Corky Crafts, MD;  Location: [facility] CATH LAB;  Service: Cardiovascular;  Laterality: N/A;  . MEMBRANE PEEL Left 07/16/2013   Procedure: MEMBRANE PEEL;  Surgeon: Shade Flood, MD;  Location: [facility] OR;  Service: Ophthalmology;  Laterality: Left;  . PARS PLANA VITRECTOMY Left 07/16/2013   Procedure: PARS PLANA VITRECTOMY WITH 23 GAUGE;  Surgeon: Shade Flood, MD;  Location: [facility] OR;  Service: Ophthalmology;  Laterality: Left;  . PARS PLANA VITRECTOMY Left 07/30/2013   Procedure: PARS PLANA VITRECTOMY WITH 23 GAUGE WITH ENDOLASER;  Surgeon: Shade Flood, MD;  Location: [facility] OR;  Service: Ophthalmology;  Laterality: Left;  with endolaser  . PERCUTANEOUS CORONARY STENT INTERVENTION (PCI-S) N/A 07/06/2011   Procedure: PERCUTANEOUS CORONARY STENT INTERVENTION (PCI-S);  Surgeon: Corky Crafts, MD;  Location: [facility] CATH LAB;  Service: Cardiovascular;  Laterality: N/A;  . PHOTOCOAGULATION WITH LASER Left 07/16/2013   Procedure: PHOTOCOAGULATION WITH LASER;  Surgeon: Shade Flood, MD;  Location: [facility] OR;  Service: Ophthalmology;  Laterality: Left;  ENDOLASER     Current Outpatient Medications  Medication Sig Dispense Refill  . acetaminophen (TYLENOL) 500 MG tablet Take 500-1,000 mg by mouth every 6 (six) hours as needed for moderate pain.     Marland Kitchen albuterol (PROVENTIL HFA;VENTOLIN HFA) 108 (90 Base) MCG/ACT inhaler Inhale 2 puffs into the lungs every 6 (six) hours as needed for wheezing or shortness of breath. 1 Inhaler 0  . atorvastatin (LIPITOR) 40 MG tablet TAKE 2 TABLETS BY MOUTH EVERY DAY WITH EVENING MEAL 60 tablet 11  . cyclobenzaprine (FLEXERIL) 10 MG tablet Take  1-2 tablets by mouth at bedtime as needed. Muscle spasms  5  . ferrous sulfate 325 (65 FE) MG tablet Take 1 tablet (325 mg total) by mouth daily with breakfast. 90 tablet 3  . fexofenadine (ALLEGRA) 180 MG tablet Take 1 tablet (180 mg total) by mouth daily. 30 tablet 11  . folic acid (FOLVITE) 1 MG tablet Take 1 mg by mouth daily.    . furosemide (LASIX) 40 MG tablet TAKE 1 TABLET BY MOUTH EVERY MORNING 30 tablet 11  . Incontinence Supply Disposable (POISE ULTRA THINS) PADS Use 5 pads daily as needed for urinary stress incontinence 150 each 11  . insulin aspart (NOVOLOG) 100 UNIT/ML injection Inject 4-16 Units into the skin 3 (three) times daily with meals. Sliding scale CBG 100-150; 4 units, 151-200; 6 units, 201-250; 8  units, 251-300; 10 units, 301-350; 12 units, 351-400; 14 units, > 401; call doctor    . insulin glargine (LANTUS) 100 UNIT/ML injection Inject 10 Units into the skin 2 (two) times daily.     . methotrexate (RHEUMATREX) 2.5 MG tablet Take 4 tablets (10 mg total) by mouth 2 (two) times a week. Caution:Chemotherapy. Protect from light. 40 tablet 1  . metoCLOPramide (REGLAN) 10 MG tablet Take 0.5 tablets (5 mg total) by mouth 4 (four) times daily -  before meals and at bedtime. (Patient taking differently: Take 10 mg by mouth 4 (four) times daily -  before meals and at bedtime. ) 60 tablet 11  . metoprolol tartrate (LOPRESSOR) 25 MG tablet Take 1 tablet (25 mg total) by mouth 2 (two) times daily. 60 tablet 11  . nicotine (NICODERM CQ - DOSED IN MG/24 HOURS) 14 mg/24hr patch Apply new patch to skin every 24 hours for 28 days. 28 patch 0  . nicotine (NICODERM CQ - DOSED IN MG/24 HR) 7 mg/24hr patch Apply new patch to skin every 24 hours after finish 14 mg patches. 7 patch 0  . nitroGLYCERIN (NITROSTAT) 0.4 MG SL tablet Place 1 tablet (0.4 mg total) under the tongue every 5 (five) minutes as needed for chest pain. 25 tablet 3  . ondansetron (ZOFRAN) 4 MG tablet Take 4 mg by mouth as needed  for nausea/vomiting.    . pantoprazole (PROTONIX) 40 MG tablet Take 1 tablet (40 mg total) by mouth 2 (two) times daily. 60 tablet 11  . prednisoLONE acetate (PRED FORTE) 1 % ophthalmic suspension Place 4-5 drops into the left eye daily.  6  . sertraline (ZOLOFT) 100 MG tablet 1 tab by mouth daily 45 tablet 11  . traMADol (ULTRAM) 50 MG tablet TAKE 1 TABLET EVERY 6 HOURS AS NEEDED FOR MODERATE PAIN 90 tablet 2   No current facility-administered medications for this visit.     Allergies:   Aspirin    Social History:  The patient  reports that she has been smoking cigarettes.  She has a 11.00 pack-year smoking history. She has never used smokeless tobacco. She reports that she drinks alcohol. She reports that she does not use drugs.   Family History:  The patient's family history includes Breast cancer (age of onset: 23) in her mother; Cancer in her sister; Hypertension in her father and sister; Lung cancer in her sister; Stomach cancer in her sister.    ROS:  Please see the history of present illness.   Otherwise, review of systems are positive for left-sided chest pain.   All other systems are reviewed and negative.    PHYSICAL EXAM: VS:  BP 112/74   Pulse 84   Ht 5\' 1"  (1.549 m)   Wt 182 lb 6.4 oz (82.7 kg)   LMP 01/12/2015   SpO2 95%   BMI 34.46 kg/m  , BMI Body mass index is 34.46 kg/m. GEN: Well nourished, well developed, in no acute distress  HEENT: normal  Neck: no JVD, carotid bruits, or masses Cardiac: RRR; no murmurs, rubs, or gallops,no edema  Respiratory:  clear to auscultation bilaterally, normal work of breathing GI: soft, nontender, nondistended, + BS MS: no deformity or atrophy  Skin: warm and dry, no rash Neuro:  Strength and sensation are intact Psych: euthymic mood, full affect   EKG:   The ekg ordered today demonstrates normal sinus rhythm, inferior Q waves, inferolateral ST changes, no change compared to prior   Recent Labs: No  results found for  requested labs within last 8760 hours.   Lipid Panel    Component Value Date/Time   CHOL 182 10/13/2016 0845   TRIG 82 10/13/2016 0845   HDL 69 10/13/2016 0845   CHOLHDL 2.6 10/13/2016 0845   CHOLHDL 3 10/14/2014 0835   VLDL 14.2 10/14/2014 0835   LDLCALC 97 10/13/2016 0845     Other studies Reviewed: Additional studies/ records that were reviewed today with results demonstrating: Prior ECG reviewed.,  LDL 84.   ASSESSMENT AND PLAN:  1. CAD/Old MI : No anginal symptoms.  Continue aggressive secondary prevention.  Chest pain from fall.  Plan for chest xray to evaluate for rib fracture.   Restart clopidogrel 75 mg daily.  No recent bleeding issues.  2. DM: Continue medical management.  A1C was 7.1 in 3/19 3. Hyperlipidemia: Stop lipitor and start Crestor 40 mg daily. Check labs in 3 months 4. HTN: The current medical regimen is effective;  continue present plan and medications.    Current medicines are reviewed at length with the patient today.  The patient concerns regarding her medicines were addressed.  The following changes have been made:  As above  Labs/ tests ordered today include:  No orders of the defined types were placed in this encounter.   Recommend 150 minutes/week of aerobic exercise Low fat, low carb, [place] fiber diet recommended  Disposition:   FU in 6 months   Signed, Lance Muss, MD  11/27/2017 4:20 PM    [facility] [address] [place], [place], [place]  [address] Phone: [phone]; Fax: [phone]

## 2017-11-29 ENCOUNTER — Other Ambulatory Visit: Payer: Self-pay | Admitting: Gastroenterology

## 2017-11-29 ENCOUNTER — Ambulatory Visit
Admission: RE | Admit: 2017-11-29 | Discharge: 2017-11-29 | Disposition: A | Discharge status: Home / Self Care | Payer: Medicaid Other | Source: Ambulatory Visit | Attending: Gastroenterology | Admitting: Gastroenterology

## 2017-11-29 DIAGNOSIS — W19XXXA Unspecified fall, initial encounter: Secondary | ICD-10-CM

## 2017-11-29 DIAGNOSIS — R0781 Pleurodynia: Secondary | ICD-10-CM

## 2017-12-03 ENCOUNTER — Other Ambulatory Visit: Payer: Medicaid Other | Admitting: Licensed Clinical Social Worker

## 2017-12-06 ENCOUNTER — Telehealth: Payer: Self-pay | Admitting: Interventional Cardiology

## 2017-12-06 ENCOUNTER — Other Ambulatory Visit: Payer: Self-pay | Admitting: Licensed Clinical Social Worker

## 2017-12-06 DIAGNOSIS — F329 Major depressive disorder, single episode, unspecified: Secondary | ICD-10-CM

## 2017-12-06 NOTE — Telephone Encounter (Signed)
Left message for patient to call back  

## 2017-12-06 NOTE — Telephone Encounter (Signed)
Patient calling and states that she has been nauseous, vomiting, and has had diarrhea for the past week. Patient is wondering if it is related to her plavix that she recently restarted. Patient denies any other symptoms at this time. Made patient aware that it was unlikely related to her plavix as she has taken this in the past with no issue. Patient states that her GI doctor has recently changed some of her medicines as well. Advised patient to follow up with her GI MD or PCP. Patient states that she has an appointment to see her PCP soon.

## 2017-12-06 NOTE — Telephone Encounter (Signed)
New Message:      Pt c/o medication issue:  1. Name of Medication: clopidogrel (PLAVIX) 75 MG tablet  2. How are you currently taking this medication (dosage and times per day)? Take 1 tablet (75 mg total) by mouth daily.  3. Are you having a reaction (difficulty breathing--STAT)? NO  4. What is your medication issue? Pt is having nausea and states she has been throwing up all week. Pt states this is the only new medication she has started.

## 2017-12-07 ENCOUNTER — Encounter: Payer: Self-pay | Admitting: Internal Medicine

## 2017-12-07 ENCOUNTER — Ambulatory Visit: Payer: Medicaid Other | Admitting: Internal Medicine

## 2017-12-07 VITALS — BP 122/82 | HR 72 | Resp 12 | Ht 61.0 in | Wt 175.0 lb

## 2017-12-07 DIAGNOSIS — F439 Reaction to severe stress, unspecified: Secondary | ICD-10-CM

## 2017-12-07 DIAGNOSIS — R197 Diarrhea, unspecified: Secondary | ICD-10-CM

## 2017-12-07 DIAGNOSIS — R112 Nausea with vomiting, unspecified: Secondary | ICD-10-CM

## 2017-12-07 DIAGNOSIS — E1143 Type 2 diabetes mellitus with diabetic autonomic (poly)neuropathy: Secondary | ICD-10-CM

## 2017-12-07 DIAGNOSIS — I25118 Atherosclerotic heart disease of native coronary artery with other forms of angina pectoris: Secondary | ICD-10-CM

## 2017-12-07 DIAGNOSIS — K3184 Gastroparesis: Secondary | ICD-10-CM

## 2017-12-07 DIAGNOSIS — Z720 Tobacco use: Secondary | ICD-10-CM

## 2017-12-07 DIAGNOSIS — E118 Type 2 diabetes mellitus with unspecified complications: Secondary | ICD-10-CM
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
## 2017-12-10 ENCOUNTER — Ambulatory Visit: Payer: Medicaid Other | Admitting: Internal Medicine

## 2017-12-11 NOTE — Progress Notes (Signed)
   THERAPY PROGRESS NOTE  Session Time:  Participation Level: Active  Behavioral Response: Casual and Fairly GroomedAlertDepressed  Type of Therapy: Individual Therapy  Treatment Goals addressed: Coping  Interventions: Strength-based and Supportive  Summary: Nihal Ledesma is a 52 y.o. female who presents with a depressed mood and labile affect. She reported that she has been feeling overwhelmed by emotions and life stressors recently. She shared that she found out that her boyfriend had cheated on her so she kicked him out of the house. She expressed that this was not devastating but that she had become emotionally overwhelmed because soon after the breakup, her daughter and granddaughter were in a car accident; both of them were fine but their puppy was killed. Leatta became tearful and upset as she described how close she had become to this dog over the past 6 months and that the grief for its death was intense. She shared that her "adopted" adult son had recently been released from prison and was staying with her, which was stressful because of drama with his girlfriend. She shared about the mixed emotions she feels about all of these stressors. She shared that she is moving forward by setting boundaries and focusing on her own health.   Suicidal/Homicidal: Nowithout intent/plan  Therapist Response: LCSW utilized supportive counseling techniques throughout the session in order to validate emotions and encourage open expression of emotion. LCSW and Vala processed about her stressors. LCSW provided affimrations to Lizvette for setting clear boundaries with her boyfriend and prioritizing her own needs. LCSW and Sharnese discussed how she can move forward emotionally.  Plan: Return again in 2 weeks.    [name], LCSW 12/11/2017

## 2017-12-20 ENCOUNTER — Other Ambulatory Visit: Payer: Self-pay | Admitting: Licensed Clinical Social Worker

## 2017-12-20 DIAGNOSIS — F439 Reaction to severe stress, unspecified: Secondary | ICD-10-CM

## 2017-12-21 ENCOUNTER — Telehealth: Payer: Self-pay | Admitting: Internal Medicine

## 2017-12-21 NOTE — Telephone Encounter (Signed)
Patient called requesting a letter from Dr. Delrae Alfred where states patient needs someone to ride with her to the appointments due to vision issues.  Patient also stated the letter needs to be faxed to [employer] [facility] at [employer] [facility]. Fax number is [phone].    Please advise.

## 2017-12-26 NOTE — Progress Notes (Signed)
   THERAPY PROGRESS NOTE  Session Time:  Participation Level: Active  Behavioral Response: CasualAlertEuthymic  Type of Therapy: Individual Therapy  Treatment Goals addressed: Coping  Interventions: Strength-based and Supportive  Summary: [name] [name] is a 52 y.o. female who presents with a euthymic mood and appropriate affect. She reported that she was doing much better since her last counseling session. She shared that she had reconnected with her first love, and that he was now staying in her house. She shared about the joy that she has felt in being with him again. She reported that she set a firm limit with her ex-boyfriend and he is no longer in her life. [name] [name] shared that she has recovered more from the death of the puppy. She shared about the many positive relationships that she has in her life. She expressed hope that she can continue to move away from toxic people in her life and focus on the positive.   Suicidal/Homicidal: Nowithout intent/plan  Therapist Response: LCSW utilized supportive counseling techniques throughout the session in order to validate emotions and encourage open expression of emotion. LCSW and [name] [name] processed about the feelings she has with an old love. LCSW encouraged her to move slowly in a new relationship so she doesn't lose sight of self-care.  Plan: Return again in 2 weeks.   [name], LCSW 12/26/2017

## 2017-12-27 NOTE — Telephone Encounter (Signed)
Im confused, I thought she needed rides, not someone to ride with her.  Please have her detail what happens if someone does not ride with her regarding her vision.

## 2018-01-01 ENCOUNTER — Other Ambulatory Visit: Payer: Medicaid Other | Admitting: Licensed Clinical Social Worker

## 2018-01-03 ENCOUNTER — Other Ambulatory Visit: Payer: Medicaid Other | Admitting: Licensed Clinical Social Worker

## 2018-01-03 NOTE — Telephone Encounter (Signed)
Spoke with patient.States she needs someone to ride with her because if she needs to sign a form or fill out a form she can not see to do that. States this is mainly due to her vision issues.  To Dr. Delrae Alfred for further direction.

## 2018-01-04 ENCOUNTER — Emergency Department (HOSPITAL_COMMUNITY)
Admission: EM | Admit: 2018-01-04 | Discharge: 2018-01-04 | Disposition: A | Discharge status: Home / Self Care | Payer: Medicaid Other | Attending: Emergency Medicine | Admitting: Emergency Medicine

## 2018-01-04 ENCOUNTER — Emergency Department (HOSPITAL_COMMUNITY): Payer: Medicaid Other

## 2018-01-04 ENCOUNTER — Other Ambulatory Visit: Payer: Self-pay

## 2018-01-04 ENCOUNTER — Encounter (HOSPITAL_COMMUNITY): Payer: Self-pay | Admitting: Emergency Medicine

## 2018-01-04 DIAGNOSIS — Z79899 Other long term (current) drug therapy: Secondary | ICD-10-CM | POA: Insufficient documentation

## 2018-01-04 DIAGNOSIS — N183 Chronic kidney disease, stage 3 (moderate): Secondary | ICD-10-CM | POA: Diagnosis not present

## 2018-01-04 DIAGNOSIS — I2510 Atherosclerotic heart disease of native coronary artery without angina pectoris: Secondary | ICD-10-CM | POA: Insufficient documentation

## 2018-01-04 DIAGNOSIS — S20219A Contusion of unspecified front wall of thorax, initial encounter: Secondary | ICD-10-CM | POA: Diagnosis not present

## 2018-01-04 DIAGNOSIS — I129 Hypertensive chronic kidney disease with stage 1 through stage 4 chronic kidney disease, or unspecified chronic kidney disease: Secondary | ICD-10-CM | POA: Diagnosis not present

## 2018-01-04 DIAGNOSIS — Y939 Activity, unspecified: Secondary | ICD-10-CM | POA: Insufficient documentation

## 2018-01-04 DIAGNOSIS — F17210 Nicotine dependence, cigarettes, uncomplicated: Secondary | ICD-10-CM | POA: Diagnosis not present

## 2018-01-04 DIAGNOSIS — S0990XA Unspecified injury of head, initial encounter: Secondary | ICD-10-CM | POA: Diagnosis not present

## 2018-01-04 DIAGNOSIS — E119 Type 2 diabetes mellitus without complications: Secondary | ICD-10-CM | POA: Diagnosis not present

## 2018-01-04 DIAGNOSIS — Y999 Unspecified external cause status: Secondary | ICD-10-CM | POA: Diagnosis not present

## 2018-01-04 DIAGNOSIS — S300XXA Contusion of lower back and pelvis, initial encounter: Secondary | ICD-10-CM | POA: Insufficient documentation

## 2018-01-04 DIAGNOSIS — Z794 Long term (current) use of insulin: Secondary | ICD-10-CM | POA: Insufficient documentation

## 2018-01-04 DIAGNOSIS — Y929 Unspecified place or not applicable: Secondary | ICD-10-CM | POA: Insufficient documentation

## 2018-01-04 MED ORDER — MORPHINE SULFATE (PF) 4 MG/ML IV SOLN
4.0000 mg | Freq: Once | INTRAVENOUS | Status: AC
  Administered 2018-01-04: 4 mg via INTRAVENOUS
  Filled 2018-01-04: qty 1

## 2018-01-04 MED ORDER — ONDANSETRON HCL 4 MG/2ML IJ SOLN
4.0000 mg | Freq: Once | INTRAMUSCULAR | Status: AC
  Administered 2018-01-04: 4 mg via INTRAVENOUS
  Filled 2018-01-04: qty 2

## 2018-01-04 MED ORDER — METHOCARBAMOL 500 MG PO TABS: 500.0000 mg | ORAL_TABLET | Freq: Two times a day (BID) | ORAL | 0 refills | Status: DC
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
## 2018-01-04 NOTE — ED Notes (Signed)
Pt ambulated throughout hallway with no assistance. Gait steady.

## 2018-01-04 NOTE — ED Notes (Signed)
Pt ambulatory, stable, and verbalized understanding of d/c instructions.

## 2018-01-04 NOTE — ED Notes (Signed)
Patient transported to CT 

## 2018-01-04 NOTE — Discharge Instructions (Addendum)
Continue Tylenol and tramadol at home.  Take Robaxin for muscle spasms as prescribed.  Follow-up with your family doctor as needed.  Your CTs and x-rays did not show any signs of fractures today.

## 2018-01-11 ENCOUNTER — Other Ambulatory Visit: Payer: Self-pay | Admitting: Licensed Clinical Social Worker

## 2018-01-11 DIAGNOSIS — F439 Reaction to severe stress, unspecified: Secondary | ICD-10-CM
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
## 2018-01-24 ENCOUNTER — Other Ambulatory Visit: Payer: Medicaid Other | Admitting: Licensed Clinical Social Worker

## 2018-01-24 ENCOUNTER — Ambulatory Visit: Payer: Medicaid Other | Admitting: Internal Medicine

## 2018-01-24 ENCOUNTER — Encounter: Payer: Self-pay | Admitting: Internal Medicine

## 2018-01-24 VITALS — BP 124/68 | HR 86 | Temp 97.9°F | Resp 14 | Ht 61.0 in | Wt 177.0 lb

## 2018-01-24 DIAGNOSIS — Z720 Tobacco use: Secondary | ICD-10-CM

## 2018-01-24 DIAGNOSIS — K2100 Gastro-esophageal reflux disease with esophagitis, without bleeding: Secondary | ICD-10-CM

## 2018-01-24 DIAGNOSIS — E118 Type 2 diabetes mellitus with unspecified complications: Secondary | ICD-10-CM

## 2018-01-24 DIAGNOSIS — K3184 Gastroparesis: Secondary | ICD-10-CM

## 2018-01-24 DIAGNOSIS — B372 Candidiasis of skin and nail: Secondary | ICD-10-CM

## 2018-01-24 DIAGNOSIS — F439 Reaction to severe stress, unspecified: Secondary | ICD-10-CM

## 2018-01-24 DIAGNOSIS — E1143 Type 2 diabetes mellitus with diabetic autonomic (poly)neuropathy: Secondary | ICD-10-CM

## 2018-01-24 LAB — GLUCOSE, POCT (MANUAL RESULT ENTRY): POC Glucose: 241 mg/dl — AB (ref 70–99)

## 2018-01-24 MED ORDER — NYSTATIN 100000 UNIT/GM EX POWD: CUTANEOUS | 0 refills | Status: DC

## 2018-01-24 NOTE — Patient Instructions (Addendum)
Do not take your Novolog if you are going to skip a meal.   Don't skip meals  Let me know when your are ready to consider Bupropion to stop smoking.  We would need to decrease your sertraline.  Call your eye doctor today, but I do not see an abnormality on undilated eye exam  I will call you back this evening with plan regarding your stomach/esophagus.
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
## 2018-02-11 MED ORDER — SUCRALFATE 1 G PO TABS: ORAL_TABLET | ORAL | 0 refills | Status: DC

## 2018-02-12 ENCOUNTER — Encounter: Payer: Self-pay | Admitting: Internal Medicine

## 2018-02-12 ENCOUNTER — Ambulatory Visit: Payer: Medicaid Other | Admitting: Internal Medicine

## 2018-02-12 VITALS — BP 124/72 | HR 76 | Resp 12 | Ht 61.0 in | Wt 175.0 lb

## 2018-02-12 DIAGNOSIS — L739 Follicular disorder, unspecified: Secondary | ICD-10-CM | POA: Diagnosis not present

## 2018-02-12 DIAGNOSIS — S90822A Blister (nonthermal), left foot, initial encounter: Secondary | ICD-10-CM | POA: Diagnosis not present

## 2018-02-12 MED ORDER — CEPHALEXIN 250 MG PO TABS: ORAL_TABLET | ORAL | 0 refills | Status: DC

## 2018-02-12 NOTE — Progress Notes (Signed)
Subjective:    Patient ID: [name] [name], female    DOB: May 11, 1966, 52 y.o.   MRN: [id number]  HPI  1.  Right axilla started itching about 1 week ago, so she decided to use recently prescribed Nystatin powder on the area, which spread the rash.  She stopped the powder and moved to Calamine lotion, which has helped.  2.  Large blister on back of left foot.  Her foot started hurting last week.  Sounds like she could feel there was something soft on back of heel, but due to lack of vision on her left side, could not see the blister well.  She does not remember burning the area, she does not recall ill fitting shoes or sandals.  Was wearing a hard bottomed backless sandal last week when this started.  No fever  Current Meds  Medication Sig  . acetaminophen (TYLENOL) 500 MG tablet Take 500-1,000 mg by mouth every 6 (six) hours as needed for moderate pain.   Marland Kitchen albuterol (PROVENTIL HFA;VENTOLIN HFA) 108 (90 Base) MCG/ACT inhaler Inhale 2 puffs into the lungs every 6 (six) hours as needed for wheezing or shortness of breath.  . clopidogrel (PLAVIX) 75 MG tablet Take 1 tablet (75 mg total) by mouth daily.  . cyclobenzaprine (FLEXERIL) 10 MG tablet Take 1-2 tablets by mouth at bedtime as needed. Muscle spasms  . ferrous sulfate 325 (65 FE) MG tablet Take 1 tablet (325 mg total) by mouth daily with breakfast.  . folic acid (FOLVITE) 1 MG tablet Take 1 mg by mouth daily.  . furosemide (LASIX) 40 MG tablet TAKE 1 TABLET BY MOUTH EVERY MORNING  . Incontinence Supply Disposable (POISE ULTRA THINS) PADS Use 5 pads daily as needed for urinary stress incontinence  . insulin aspart (NOVOLOG) 100 UNIT/ML injection Inject 4-16 Units into the skin 3 (three) times daily with meals. Sliding scale CBG 100-150; 4 units, 151-200; 6 units, 201-250; 8 units, 251-300; 10 units, 301-350; 12 units, 351-400; 14 units, > 401; call doctor  . insulin glargine (LANTUS) 100 UNIT/ML injection Inject 10 Units into the skin 2 (two)  times daily.   . methocarbamol (ROBAXIN) 500 MG tablet Take 1 tablet (500 mg total) by mouth 2 (two) times daily.  . methotrexate (RHEUMATREX) 2.5 MG tablet Take 4 tablets (10 mg total) by mouth 2 (two) times a week. Caution:Chemotherapy. Protect from light.  . metoCLOPramide (REGLAN) 10 MG tablet Take 0.5 tablets (5 mg total) by mouth 4 (four) times daily -  before meals and at bedtime. (Patient taking differently: Take 10 mg by mouth 4 (four) times daily -  before meals and at bedtime. )  . metoprolol tartrate (LOPRESSOR) 25 MG tablet Take 1 tablet (25 mg total) by mouth 2 (two) times daily.  . nitroGLYCERIN (NITROSTAT) 0.4 MG SL tablet Place 1 tablet (0.4 mg total) under the tongue every 5 (five) minutes as needed for chest pain.  Marland Kitchen nystatin (MYCOSTATIN/NYSTOP) powder Apply to affected area 4 times daily for 7 days or until resolved  . ondansetron (ZOFRAN) 4 MG tablet Take 4 mg by mouth as needed for nausea/vomiting.  . pantoprazole (PROTONIX) 40 MG tablet Take 1 tablet (40 mg total) by mouth 2 (two) times daily.  . prednisoLONE acetate (PRED FORTE) 1 % ophthalmic suspension Place 4-5 drops into the left eye daily.  . rosuvastatin (CRESTOR) 40 MG tablet Take 1 tablet (40 mg total) by mouth daily.  . sertraline (ZOLOFT) 100 MG tablet 1 tab by mouth daily  .  traMADol (ULTRAM) 50 MG tablet TAKE 1 TABLET EVERY 6 HOURS AS NEEDED FOR MODERATE PAIN    Allergies  Allergen Reactions  . Aspirin Other (See Comments)    Stomach bleeds Stomach bleeds GI bleeding Other reaction(s): Other (See Comments) GI bleeding Stomach bleeds     Review of Systems     Objective:   Physical Exam  1-2 mm pustules scattered in right axilla and down lateral aspect of thorax.    Large blister with clear yellow fluid over lateral left heel with some surrounding mild erythema.      Assessment & Plan:  1.  Folliculitis, right axilla:  Cephalexin 250 mg 4 times daily fr 5 days.  Warm compresses as  needed.  2.  Left heel blister:  Not clear how this occurred.  Discussed not to puncture.  If does open and not draining pustular material, to just apply bandage to absorb drainage, but not remove skin.  Cephalexin as above in case the surrounding erythema is beginning if skin infection.

## 2018-02-13 ENCOUNTER — Other Ambulatory Visit: Payer: Self-pay | Admitting: Licensed Clinical Social Worker

## 2018-02-13 DIAGNOSIS — F439 Reaction to severe stress, unspecified: Secondary | ICD-10-CM

## 2018-02-13 DIAGNOSIS — Z720 Tobacco use: Secondary | ICD-10-CM
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
## 2018-02-15 ENCOUNTER — Emergency Department (HOSPITAL_BASED_OUTPATIENT_CLINIC_OR_DEPARTMENT_OTHER): Payer: Medicaid Other

## 2018-02-15 ENCOUNTER — Inpatient Hospital Stay (HOSPITAL_BASED_OUTPATIENT_CLINIC_OR_DEPARTMENT_OTHER)
Admission: EM | Admit: 2018-02-15 | Discharge: 2018-02-17 | DRG: 603 | Disposition: A | Discharge status: Home / Self Care | Payer: Medicaid Other | Attending: Internal Medicine | Admitting: Internal Medicine

## 2018-02-15 ENCOUNTER — Encounter (HOSPITAL_BASED_OUTPATIENT_CLINIC_OR_DEPARTMENT_OTHER): Payer: Self-pay

## 2018-02-15 ENCOUNTER — Other Ambulatory Visit: Payer: Self-pay

## 2018-02-15 DIAGNOSIS — K210 Gastro-esophageal reflux disease with esophagitis: Secondary | ICD-10-CM

## 2018-02-15 DIAGNOSIS — L089 Local infection of the skin and subcutaneous tissue, unspecified: Secondary | ICD-10-CM

## 2018-02-15 DIAGNOSIS — K3184 Gastroparesis: Secondary | ICD-10-CM

## 2018-02-15 DIAGNOSIS — E669 Obesity, unspecified: Secondary | ICD-10-CM | POA: Diagnosis present

## 2018-02-15 DIAGNOSIS — I2510 Atherosclerotic heart disease of native coronary artery without angina pectoris: Secondary | ICD-10-CM | POA: Diagnosis present

## 2018-02-15 DIAGNOSIS — Z8673 Personal history of transient ischemic attack (TIA), and cerebral infarction without residual deficits: Secondary | ICD-10-CM | POA: Diagnosis not present

## 2018-02-15 DIAGNOSIS — N189 Chronic kidney disease, unspecified: Secondary | ICD-10-CM | POA: Diagnosis present

## 2018-02-15 DIAGNOSIS — E118 Type 2 diabetes mellitus with unspecified complications: Secondary | ICD-10-CM

## 2018-02-15 DIAGNOSIS — Z8711 Personal history of peptic ulcer disease: Secondary | ICD-10-CM | POA: Diagnosis not present

## 2018-02-15 DIAGNOSIS — E1159 Type 2 diabetes mellitus with other circulatory complications: Secondary | ICD-10-CM | POA: Diagnosis present

## 2018-02-15 DIAGNOSIS — Z79899 Other long term (current) drug therapy: Secondary | ICD-10-CM

## 2018-02-15 DIAGNOSIS — N183 Type 1 diabetes mellitus with diabetic chronic kidney disease: Secondary | ICD-10-CM | POA: Diagnosis present

## 2018-02-15 DIAGNOSIS — R112 Nausea with vomiting, unspecified: Secondary | ICD-10-CM | POA: Diagnosis not present

## 2018-02-15 DIAGNOSIS — M86172 Other acute osteomyelitis, left ankle and foot: Secondary | ICD-10-CM | POA: Diagnosis not present

## 2018-02-15 DIAGNOSIS — F411 Generalized anxiety disorder: Secondary | ICD-10-CM | POA: Diagnosis present

## 2018-02-15 DIAGNOSIS — I25118 Atherosclerotic heart disease of native coronary artery with other forms of angina pectoris: Secondary | ICD-10-CM | POA: Diagnosis not present

## 2018-02-15 DIAGNOSIS — Z801 Family history of malignant neoplasm of trachea, bronchus and lung: Secondary | ICD-10-CM

## 2018-02-15 DIAGNOSIS — L03116 Cellulitis of left lower limb: Secondary | ICD-10-CM | POA: Diagnosis present

## 2018-02-15 DIAGNOSIS — Z955 Presence of coronary angioplasty implant and graft: Secondary | ICD-10-CM | POA: Diagnosis not present

## 2018-02-15 DIAGNOSIS — E1069 Type 1 diabetes mellitus with other specified complication: Secondary | ICD-10-CM | POA: Diagnosis present

## 2018-02-15 DIAGNOSIS — E108 Type 1 diabetes mellitus with unspecified complications: Secondary | ICD-10-CM | POA: Diagnosis present

## 2018-02-15 DIAGNOSIS — N179 Acute kidney failure, unspecified: Secondary | ICD-10-CM | POA: Diagnosis present

## 2018-02-15 DIAGNOSIS — Z794 Long term (current) use of insulin: Secondary | ICD-10-CM

## 2018-02-15 DIAGNOSIS — F17210 Nicotine dependence, cigarettes, uncomplicated: Secondary | ICD-10-CM | POA: Diagnosis present

## 2018-02-15 DIAGNOSIS — Z6835 Body mass index (BMI) 35.0-35.9, adult: Secondary | ICD-10-CM

## 2018-02-15 DIAGNOSIS — K219 Gastro-esophageal reflux disease without esophagitis: Secondary | ICD-10-CM | POA: Diagnosis present

## 2018-02-15 DIAGNOSIS — E876 Hypokalemia: Secondary | ICD-10-CM | POA: Diagnosis present

## 2018-02-15 DIAGNOSIS — Z8249 Family history of ischemic heart disease and other diseases of the circulatory system: Secondary | ICD-10-CM

## 2018-02-15 DIAGNOSIS — Z800 Family history of malignant neoplasm of digestive organs: Secondary | ICD-10-CM

## 2018-02-15 DIAGNOSIS — E1122 Type 2 diabetes mellitus with diabetic chronic kidney disease: Secondary | ICD-10-CM | POA: Diagnosis present

## 2018-02-15 DIAGNOSIS — E1169 Type 2 diabetes mellitus with other specified complication: Secondary | ICD-10-CM | POA: Diagnosis present

## 2018-02-15 DIAGNOSIS — E11319 Type 2 diabetes mellitus with unspecified diabetic retinopathy without macular edema: Secondary | ICD-10-CM | POA: Diagnosis present

## 2018-02-15 DIAGNOSIS — X58XXXA Exposure to other specified factors, initial encounter: Secondary | ICD-10-CM | POA: Diagnosis not present

## 2018-02-15 DIAGNOSIS — Z23 Encounter for immunization: Secondary | ICD-10-CM

## 2018-02-15 DIAGNOSIS — Z803 Family history of malignant neoplasm of breast: Secondary | ICD-10-CM | POA: Diagnosis not present

## 2018-02-15 DIAGNOSIS — Z8719 Personal history of other diseases of the digestive system: Secondary | ICD-10-CM

## 2018-02-15 DIAGNOSIS — Z9861 Coronary angioplasty status: Secondary | ICD-10-CM | POA: Diagnosis present

## 2018-02-15 DIAGNOSIS — S90822A Blister (nonthermal), left foot, initial encounter: Secondary | ICD-10-CM | POA: Diagnosis present

## 2018-02-15 DIAGNOSIS — M059 Rheumatoid arthritis with rheumatoid factor, unspecified: Secondary | ICD-10-CM

## 2018-02-15 DIAGNOSIS — M79672 Pain in left foot: Secondary | ICD-10-CM | POA: Diagnosis not present

## 2018-02-15 DIAGNOSIS — I10 Essential (primary) hypertension: Secondary | ICD-10-CM | POA: Diagnosis present

## 2018-02-15 DIAGNOSIS — F319 Bipolar disorder, unspecified: Secondary | ICD-10-CM | POA: Diagnosis present

## 2018-02-15 DIAGNOSIS — E1022 Type 1 diabetes mellitus with diabetic chronic kidney disease: Secondary | ICD-10-CM | POA: Diagnosis present

## 2018-02-15 DIAGNOSIS — R05 Cough: Secondary | ICD-10-CM

## 2018-02-15 DIAGNOSIS — Z7902 Long term (current) use of antithrombotics/antiplatelets: Secondary | ICD-10-CM

## 2018-02-15 DIAGNOSIS — I252 Old myocardial infarction: Secondary | ICD-10-CM | POA: Diagnosis not present

## 2018-02-15 DIAGNOSIS — I129 Hypertensive chronic kidney disease with stage 1 through stage 4 chronic kidney disease, or unspecified chronic kidney disease: Secondary | ICD-10-CM | POA: Diagnosis present

## 2018-02-15 DIAGNOSIS — Z886 Allergy status to analgesic agent status: Secondary | ICD-10-CM

## 2018-02-15 DIAGNOSIS — E1143 Type 2 diabetes mellitus with diabetic autonomic (poly)neuropathy: Secondary | ICD-10-CM | POA: Diagnosis present

## 2018-02-15 DIAGNOSIS — M069 Rheumatoid arthritis, unspecified: Secondary | ICD-10-CM | POA: Diagnosis present

## 2018-02-15 DIAGNOSIS — E785 Hyperlipidemia, unspecified: Secondary | ICD-10-CM | POA: Diagnosis present

## 2018-02-15 DIAGNOSIS — R059 Cough, unspecified: Secondary | ICD-10-CM

## 2018-02-15 DIAGNOSIS — M79673 Pain in unspecified foot: Secondary | ICD-10-CM | POA: Diagnosis present

## 2018-02-15 HISTORY — DX: Nausea with vomiting, unspecified: R11.2

## 2018-02-15 HISTORY — DX: Other acute osteomyelitis, left ankle and foot: M86.172

## 2018-02-15 LAB — COMPREHENSIVE METABOLIC PANEL
ALBUMIN: 3.6 g/dL (ref 3.5–5.0)
ALT: 7 U/L (ref 0–44)
AST: 17 U/L (ref 15–41)
Alkaline Phosphatase: 131 U/L — ABNORMAL HIGH (ref 38–126)
Anion gap: 13 (ref 5–15)
BILIRUBIN TOTAL: 0.5 mg/dL (ref 0.3–1.2)
BUN: 17 mg/dL (ref 6–20)
CO2: 24 mmol/L (ref 22–32)
Calcium: 9.3 mg/dL (ref 8.9–10.3)
Chloride: 105 mmol/L (ref 98–111)
Creatinine, Ser: 1.64 mg/dL — ABNORMAL HIGH (ref 0.44–1.00)
GFR calc Af Amer: 41 mL/min — ABNORMAL LOW (ref >60)
GFR calc non Af Amer: 35 mL/min — ABNORMAL LOW (ref >60)
GLUCOSE: 116 mg/dL — AB (ref 70–99)
POTASSIUM: 3.1 mmol/L — AB (ref 3.5–5.1)
Sodium: 142 mmol/L (ref 135–145)
Total Protein: 8.4 g/dL — ABNORMAL HIGH (ref 6.5–8.1)

## 2018-02-15 LAB — RAPID URINE DRUG SCREEN, HOSP PERFORMED
Amphetamines: NOT DETECTED
Benzodiazepines: NOT DETECTED
Cocaine: NOT DETECTED
OPIATES: NOT DETECTED
Tetrahydrocannabinol: NOT DETECTED

## 2018-02-15 LAB — SEDIMENTATION RATE: Sed Rate: 78 mm/hr — ABNORMAL HIGH (ref 0–22)

## 2018-02-15 LAB — URINALYSIS, ROUTINE W REFLEX MICROSCOPIC
Glucose, UA: NEGATIVE mg/dL
KETONES UR: 15 mg/dL — AB
Nitrite: NEGATIVE
Protein, ur: 100 mg/dL — AB
SPECIFIC GRAVITY, URINE: 1.02 (ref 1.005–1.030)
pH: 6 (ref 5.0–8.0)

## 2018-02-15 LAB — URINALYSIS, MICROSCOPIC (REFLEX)

## 2018-02-15 LAB — CBC WITH DIFFERENTIAL/PLATELET
Basophils Absolute: 0 10*3/uL (ref 0.0–0.1)
Basophils Relative: 0 %
EOS PCT: 2 %
Eosinophils Absolute: 0.2 10*3/uL (ref 0.0–0.7)
HCT: 37.3 % (ref 36.0–46.0)
Hemoglobin: 12.2 g/dL (ref 12.0–15.0)
LYMPHS ABS: 1 10*3/uL (ref 0.7–4.0)
LYMPHS PCT: 11 %
MCH: 30.3 pg (ref 26.0–34.0)
MCHC: 32.7 g/dL (ref 30.0–36.0)
MCV: 92.6 fL (ref 78.0–100.0)
Monocytes Absolute: 0.5 10*3/uL (ref 0.1–1.0)
Monocytes Relative: 5 %
Neutro Abs: 7.3 10*3/uL (ref 1.7–7.7)
Neutrophils Relative %: 82 %
PLATELETS: 385 10*3/uL (ref 150–400)
RBC: 4.03 MIL/uL (ref 3.87–5.11)
RDW: 18.3 % — ABNORMAL HIGH (ref 11.5–15.5)
WBC: 9 10*3/uL (ref 4.0–10.5)

## 2018-02-15 LAB — I-STAT CG4 LACTIC ACID, ED: Lactic Acid, Venous: 1.08 mmol/L (ref 0.5–1.9)

## 2018-02-15 LAB — GLUCOSE, CAPILLARY
GLUCOSE-CAPILLARY: 169 mg/dL — AB (ref 70–99)
GLUCOSE-CAPILLARY: 300 mg/dL — AB (ref 70–99)

## 2018-02-15 LAB — CBC
HCT: 35.8 % — ABNORMAL LOW (ref 36.0–46.0)
HEMOGLOBIN: 11.4 g/dL — AB (ref 12.0–15.0)
MCH: 30 pg (ref 26.0–34.0)
MCHC: 31.8 g/dL (ref 30.0–36.0)
MCV: 94.2 fL (ref 78.0–100.0)
Platelets: 374 10*3/uL (ref 150–400)
RBC: 3.8 MIL/uL — AB (ref 3.87–5.11)
RDW: 18.4 % — ABNORMAL HIGH (ref 11.5–15.5)
WBC: 7.5 10*3/uL (ref 4.0–10.5)

## 2018-02-15 LAB — CREATININE, SERUM
Creatinine, Ser: 1.43 mg/dL — ABNORMAL HIGH (ref 0.44–1.00)
GFR calc Af Amer: 48 mL/min — ABNORMAL LOW (ref >60)
GFR calc non Af Amer: 42 mL/min — ABNORMAL LOW (ref >60)

## 2018-02-15 LAB — HEMOGLOBIN A1C
Hgb A1c MFr Bld: 7.3 % — ABNORMAL HIGH (ref 4.8–5.6)
Mean Plasma Glucose: 162.81 mg/dL

## 2018-02-15 LAB — LIPASE, BLOOD: LIPASE: 21 U/L (ref 11–51)

## 2018-02-15 LAB — CBG MONITORING, ED: GLUCOSE-CAPILLARY: 108 mg/dL — AB (ref 70–99)

## 2018-02-15 LAB — TROPONIN I: Troponin I: <0.03 ng/mL (ref <0.03)

## 2018-02-15 LAB — C-REACTIVE PROTEIN: CRP: 11.6 mg/dL — ABNORMAL HIGH (ref <1.0)

## 2018-02-15 MED ORDER — PREDNISOLONE ACETATE 1 % OP SUSP
4.0000 [drp] | Freq: Every day | OPHTHALMIC | Status: DC
  Administered 2018-02-16 – 2018-02-17 (×2): 4 [drp] via OPHTHALMIC
  Filled 2018-02-15: qty 5

## 2018-02-15 MED ORDER — ACETAMINOPHEN 325 MG PO TABS
650.0000 mg | ORAL_TABLET | Freq: Four times a day (QID) | ORAL | Status: DC | PRN
  Administered 2018-02-15 – 2018-02-16 (×3): 650 mg via ORAL
  Filled 2018-02-15 (×3): qty 2

## 2018-02-15 MED ORDER — METOCLOPRAMIDE HCL 5 MG PO TABS
5.0000 mg | ORAL_TABLET | Freq: Three times a day (TID) | ORAL | Status: DC
  Administered 2018-02-15 – 2018-02-17 (×6): 5 mg via ORAL
  Filled 2018-02-15 (×6): qty 1

## 2018-02-15 MED ORDER — INSULIN ASPART 100 UNIT/ML ~~LOC~~ SOLN
0.0000 [IU] | Freq: Three times a day (TID) | SUBCUTANEOUS | Status: DC
  Administered 2018-02-15: 8 [IU] via SUBCUTANEOUS
  Administered 2018-02-15: 3 [IU] via SUBCUTANEOUS
  Administered 2018-02-15: 8 [IU] via SUBCUTANEOUS
  Administered 2018-02-16: 5 [IU] via SUBCUTANEOUS
  Administered 2018-02-16: 3 [IU] via SUBCUTANEOUS
  Administered 2018-02-16 – 2018-02-17 (×2): 8 [IU] via SUBCUTANEOUS

## 2018-02-15 MED ORDER — SODIUM CHLORIDE 0.9 % IV SOLN
2.0000 g | INTRAVENOUS | Status: DC
  Administered 2018-02-15 – 2018-02-16 (×2): 2 g via INTRAVENOUS
  Filled 2018-02-15 (×2): qty 2

## 2018-02-15 MED ORDER — VANCOMYCIN HCL 10 G IV SOLR: 1250.0000 mg | INTRAVENOUS | Status: DC

## 2018-02-15 MED ORDER — LORATADINE 10 MG PO TABS: 10.0000 mg | ORAL_TABLET | Freq: Every day | ORAL | Status: DC

## 2018-02-15 MED ORDER — ROSUVASTATIN CALCIUM 10 MG PO TABS
40.0000 mg | ORAL_TABLET | Freq: Every day | ORAL | Status: DC
  Administered 2018-02-16 – 2018-02-17 (×2): 40 mg via ORAL
  Filled 2018-02-15 (×2): qty 4

## 2018-02-15 MED ORDER — ONDANSETRON HCL 4 MG/2ML IJ SOLN
4.0000 mg | Freq: Four times a day (QID) | INTRAMUSCULAR | Status: DC | PRN
  Administered 2018-02-15: 4 mg via INTRAVENOUS
  Filled 2018-02-15: qty 2

## 2018-02-15 MED ORDER — HEPARIN SODIUM (PORCINE) 5000 UNIT/ML IJ SOLN
5000.0000 [IU] | Freq: Three times a day (TID) | INTRAMUSCULAR | Status: DC
  Administered 2018-02-15 – 2018-02-16 (×4): 5000 [IU] via SUBCUTANEOUS
  Filled 2018-02-15 (×4): qty 1

## 2018-02-15 MED ORDER — INSULIN ASPART 100 UNIT/ML ~~LOC~~ SOLN
0.0000 [IU] | Freq: Every day | SUBCUTANEOUS | Status: DC
  Administered 2018-02-15: 3 [IU] via SUBCUTANEOUS
  Administered 2018-02-16: 2 [IU] via SUBCUTANEOUS

## 2018-02-15 MED ORDER — SODIUM CHLORIDE 0.9 % IV BOLUS (SEPSIS)
1000.0000 mL | Freq: Once | INTRAVENOUS | Status: AC
  Administered 2018-02-15: 1000 mL via INTRAVENOUS

## 2018-02-15 MED ORDER — ACETAMINOPHEN 650 MG RE SUPP
650.0000 mg | Freq: Four times a day (QID) | RECTAL | Status: DC | PRN
  Filled 2018-02-15: qty 1

## 2018-02-15 MED ORDER — SERTRALINE HCL 100 MG PO TABS
100.0000 mg | ORAL_TABLET | Freq: Every day | ORAL | Status: DC
Start: 2018-02-16 — End: 2018-02-17
  Administered 2018-02-16 – 2018-02-17 (×2): 100 mg via ORAL
  Filled 2018-02-15 (×3): qty 1

## 2018-02-15 MED ORDER — DICYCLOMINE HCL 10 MG PO CAPS
20.0000 mg | ORAL_CAPSULE | Freq: Once | ORAL | Status: AC
  Administered 2018-02-15: 20 mg via ORAL
  Filled 2018-02-15: qty 2

## 2018-02-15 MED ORDER — NITROGLYCERIN 0.4 MG SL SUBL: 0.4000 mg | SUBLINGUAL_TABLET | SUBLINGUAL | Status: DC | PRN

## 2018-02-15 MED ORDER — ONDANSETRON HCL 4 MG/2ML IJ SOLN
4.0000 mg | Freq: Once | INTRAMUSCULAR | Status: AC
  Administered 2018-02-15: 4 mg via INTRAVENOUS
  Filled 2018-02-15: qty 2

## 2018-02-15 MED ORDER — POTASSIUM CHLORIDE CRYS ER 20 MEQ PO TBCR
40.0000 meq | EXTENDED_RELEASE_TABLET | Freq: Two times a day (BID) | ORAL | Status: AC
  Administered 2018-02-15 (×2): 40 meq via ORAL
  Filled 2018-02-15 (×2): qty 2

## 2018-02-15 MED ORDER — METHOCARBAMOL 500 MG PO TABS: 500.0000 mg | ORAL_TABLET | Freq: Two times a day (BID) | ORAL | Status: DC

## 2018-02-15 MED ORDER — PNEUMOCOCCAL VAC POLYVALENT 25 MCG/0.5ML IJ INJ
0.5000 mL | INJECTION | INTRAMUSCULAR | Status: AC
  Administered 2018-02-16: 0.5 mL via INTRAMUSCULAR
  Filled 2018-02-15: qty 0.5

## 2018-02-15 MED ORDER — METOPROLOL TARTRATE 25 MG PO TABS
25.0000 mg | ORAL_TABLET | Freq: Two times a day (BID) | ORAL | Status: DC
  Administered 2018-02-15 – 2018-02-17 (×4): 25 mg via ORAL
  Filled 2018-02-15 (×4): qty 1

## 2018-02-15 MED ORDER — SODIUM CHLORIDE 0.9 % IV BOLUS (SEPSIS)
500.0000 mL | Freq: Once | INTRAVENOUS | Status: AC
  Administered 2018-02-15: 500 mL via INTRAVENOUS

## 2018-02-15 MED ORDER — FOLIC ACID 1 MG PO TABS
1.0000 mg | ORAL_TABLET | Freq: Every day | ORAL | Status: DC
  Administered 2018-02-16 – 2018-02-17 (×2): 1 mg via ORAL
  Filled 2018-02-15 (×2): qty 1

## 2018-02-15 MED ORDER — VANCOMYCIN HCL IN DEXTROSE 1-5 GM/200ML-% IV SOLN
1000.0000 mg | Freq: Once | INTRAVENOUS | Status: AC
  Administered 2018-02-15: 1000 mg via INTRAVENOUS
  Filled 2018-02-15: qty 200

## 2018-02-15 MED ORDER — ACETAMINOPHEN 500 MG PO TABS
1000.0000 mg | ORAL_TABLET | Freq: Once | ORAL | Status: AC
  Administered 2018-02-15: 1000 mg via ORAL
  Filled 2018-02-15: qty 2

## 2018-02-15 MED ORDER — SENNOSIDES-DOCUSATE SODIUM 8.6-50 MG PO TABS: 1.0000 | ORAL_TABLET | Freq: Every evening | ORAL | Status: DC | PRN

## 2018-02-15 MED ORDER — INSULIN GLARGINE 100 UNIT/ML ~~LOC~~ SOLN
10.0000 [IU] | Freq: Two times a day (BID) | SUBCUTANEOUS | Status: DC
  Administered 2018-02-15 – 2018-02-17 (×4): 10 [IU] via SUBCUTANEOUS
  Filled 2018-02-15 (×5): qty 0.1

## 2018-02-15 MED ORDER — TRAMADOL HCL 50 MG PO TABS
50.0000 mg | ORAL_TABLET | Freq: Four times a day (QID) | ORAL | Status: DC | PRN
  Administered 2018-02-15 – 2018-02-16 (×3): 50 mg via ORAL
  Filled 2018-02-15 (×3): qty 1

## 2018-02-15 MED ORDER — PANTOPRAZOLE SODIUM 40 MG PO TBEC
40.0000 mg | DELAYED_RELEASE_TABLET | Freq: Two times a day (BID) | ORAL | Status: DC
  Administered 2018-02-15 – 2018-02-17 (×4): 40 mg via ORAL
  Filled 2018-02-15 (×4): qty 1

## 2018-02-15 MED ORDER — ONDANSETRON HCL 4 MG PO TABS
4.0000 mg | ORAL_TABLET | Freq: Four times a day (QID) | ORAL | Status: DC | PRN
  Administered 2018-02-17: 4 mg via ORAL
  Filled 2018-02-15: qty 1

## 2018-02-15 MED ORDER — CLOPIDOGREL BISULFATE 75 MG PO TABS
75.0000 mg | ORAL_TABLET | Freq: Every day | ORAL | Status: DC
  Administered 2018-02-16 – 2018-02-17 (×2): 75 mg via ORAL
  Filled 2018-02-15 (×2): qty 1

## 2018-02-15 MED ORDER — FERROUS SULFATE 325 (65 FE) MG PO TABS
325.0000 mg | ORAL_TABLET | Freq: Every day | ORAL | Status: DC
  Administered 2018-02-16 – 2018-02-17 (×2): 325 mg via ORAL
  Filled 2018-02-15 (×2): qty 1

## 2018-02-15 MED ORDER — SODIUM CHLORIDE 0.9 % IV SOLN
INTRAVENOUS | Status: AC
  Administered 2018-02-15: 18:00:00 via INTRAVENOUS

## 2018-02-15 MED ORDER — ACETAMINOPHEN 500 MG PO TABS: 500.0000 mg | ORAL_TABLET | Freq: Four times a day (QID) | ORAL | Status: DC | PRN

## 2018-02-15 MED ORDER — PIPERACILLIN-TAZOBACTAM 3.375 G IVPB 30 MIN
3.3750 g | Freq: Once | INTRAVENOUS | Status: AC
  Administered 2018-02-15: 3.375 g via INTRAVENOUS
  Filled 2018-02-15 (×2): qty 50

## 2018-02-15 NOTE — Progress Notes (Signed)
Called by PA Margarita Mail at [facility] for admission for this patient.  Patient is a 52 year old female with a past medical history of diabetes mellitus type 2, rheumatoid arthritis on methotrexate, and recent left foot infection who was prescribed outpatient antibiotics with Keflex presented with nausea, vomiting, lightheadedness and left foot pain.  Left foot heel ulcer was imaged with a x-ray and there was concern for osteomyelitis so patient will be transferred to [facility] for MRI and initiation of IV antibiotics.  Patient was started on IV vancomycin and IV Keflex in the ED.  Notably patient also has a acute kidney injury and hypokalemia.  ESR and CRP are pending.  Patient was accepted to a telemetry bed for admission.

## 2018-02-15 NOTE — ED Provider Notes (Signed)
[facility] EMERGENCY DEPARTMENT Provider Note   CSN: [id number] Arrival date & time: 02/15/18  9244     History   Chief Complaint Chief Complaint  Patient presents with  . Emesis  . Dizziness    HPI [name] [name] is a 52 y.o. female who presents the emergency department with chief complaint of vomiting.  Patient has a past medical history of MI, CAD, bipolar disorder, gastritis/esophagitis, type 2 diabetes and polysubstance abuse.  Patient is currently under treatment for left heel infection taking Keflex.  She has been on this medication since Tuesday which she got from her primary care for provider.  Patient states that yesterday she developed the symptoms of URI including productive cough, headache, body ache, chills, nasal congestion.  She took Tylenol cold and flu which relieved her symptoms somewhat.  This morning she developed diffuse abdominal pain, cramping, and had multiple episodes of nonbloody nonbilious vomitus.  She denies chest pain, shortness of breath.  She states she is able to ambulate on her toes.  HPI  Past Medical History:  Diagnosis Date  . Acute myocardial infarction of other lateral wall, initial episode of care   . Acute myocardial infarction, unspecified site, initial episode of care   . Acute osteomyelitis   . Allergic rhinitis   . Anemia   . Anginal pain (HCC)    07/15/13- no chest pain in months"  . Anxiety   . Bipolar affective (HCC)   . CAD (coronary artery disease) 12/12   s/p DES mid and distal RCA with 50% LAD  . Daily headache    not daily  . Depression    Bipolar disorder  . Diabetic retinopathy   . Esophageal stenosis   . Esophageal ulcer   . Esophagitis   . Gastroparesis   . Genital herpes    Reportedly tested and documented by [facility]--rare occurrences  . GERD (gastroesophageal reflux disease)   . Heart murmur   . History of stomach ulcers   . Hyperlipidemia   . Hypertension   . 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   [name]  on 12/19/2012, "that's the only one I've had" (12/19/2012)  . Migraines   . Orthopnoea   . Pneumonia 2012  . Polysubstance abuse (HCC)    Crack cocaine--none since 2008, MJ, ETOH:  clean of all since 2008  . Renal insufficiency   . Rheumatoid arthritis(714.0)   . Sebaceous cyst   . Stroke [facility]) 2011   denies residual on 12/19/2012.  "Years ago"  . Type II diabetes mellitus (HCC)    Previously uncontrolled for many years with multiple complications.  2017 controlled. 05/2016:  6.7%    Patient Active Problem List   Diagnosis Date Noted  . Urinary, incontinence, stress female 10/14/2015  . Obesity (BMI 30-39.9) 06/21/2015  . Acute renal failure syndrome (HCC)   . DKA (diabetic ketoacidoses) (HCC) 02/21/2015  . Acute renal failure (HCC) 02/21/2015  . Acute UTI 02/21/2015  . CKD (chronic kidney disease), stage III (HCC) 02/21/2015  . Chronic systolic congestive heart failure, NYHA class 1 (HCC) 02/21/2015  . History of NSTEMI w/ DES to cfx May 2014 10/02/2014  . 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 Hyperlipidemia   . Tobacco abuse 10/03/2013  . Diabetic gastroparesis (HCC) 01/16/2013  . Nausea vomiting and diarrhea 12/29/2012  . Coronary atherosclerosis 08/25/2010  . Dehydration 06/17/2010  . Anemia 03/21/2010  . ALLERGIC RHINITIS 12/28/2009  . HTN (hypertension) 10/08/2009  . Rheumatoid arthritis (HCC) 02/18/2009  . Diabetic  retinopathy (HCC) 08/13/2007  . ACUTE OSTEOMYELITIS, ANKLE AND FOOT 06/25/2007  . SUBSTANCE ABUSE, MULTIPLE 03/27/2007  . GERD 03/27/2007  . RENAL INSUFFICIENCY, CHRONIC 03/27/2007  . ANXIETY 03/25/2007  . DEPRESSION 03/25/2007  . ULCER, ESOPHAGUS WITHOUT BLEEDING 07/06/2006  . ESOPHAGEAL STENOSIS 07/06/2006  . Diabetes mellitus with multiple complications (HCC) 06/21/2006    Past Surgical History:  Procedure Laterality Date  . CORONARY ANGIOPLASTY WITH STENT PLACEMENT  01/20/2009   "2" (12/19/2012)  . CORONARY ANGIOPLASTY WITH STENT PLACEMENT  2012    "2" (12/19/2012)  . CORONARY ANGIOPLASTY WITH STENT PLACEMENT  12/19/2012   "2" (12/19/2012)  . ESOPHAGOGASTRODUODENOSCOPY N/A 02/26/2015   Procedure: ESOPHAGOGASTRODUODENOSCOPY (EGD);  Surgeon: Dorena Cookey, MD;  Location: [facility] ENDOSCOPY;  Service: Endoscopy;  Laterality: N/A;  . EYE SURGERY     Multiple surgeries of both eyes:  last laser was 09/08/2015 of right eye.  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 GAS INSERTION Left 07/16/2013   Procedure: INSERTION OF GAS;  Surgeon: Shade Flood, MD;  Location: [facility] OR;  Service: Ophthalmology;  Laterality: Left;  SF6  . GAS/FLUID EXCHANGE Left 07/30/2013   Procedure: GAS/FLUID EXCHANGE;  Surgeon: Shade Flood, MD;  Location: [facility] OR;  Service: Ophthalmology;  Laterality: Left;  . IRRIGATION AND DEBRIDEMENT SEBACEOUS CYST Right 03/2011   "pointer" (12/19/2012)  . LEFT HEART CATHETERIZATION WITH CORONARY ANGIOGRAM N/A 07/06/2011   Procedure: LEFT HEART CATHETERIZATION WITH CORONARY ANGIOGRAM;  Surgeon: Corky Crafts, MD;  Location: [facility] CATH LAB;  Service: Cardiovascular;  Laterality: N/A;  possible PCI  . LEFT HEART CATHETERIZATION WITH CORONARY ANGIOGRAM N/A 12/19/2012   Procedure: LEFT HEART CATHETERIZATION WITH CORONARY ANGIOGRAM;  Surgeon: Corky Crafts, MD;  Location: [facility] CATH LAB;  Service: Cardiovascular;  Laterality: N/A;  . MEMBRANE PEEL Left 07/16/2013   Procedure: MEMBRANE PEEL;  Surgeon: Shade Flood, MD;  Location: [facility] OR;  Service: Ophthalmology;  Laterality: Left;  . PARS PLANA VITRECTOMY Left 07/16/2013   Procedure: PARS PLANA VITRECTOMY WITH 23 GAUGE;  Surgeon: Shade Flood, MD;  Location: [facility] OR;  Service: Ophthalmology;  Laterality: Left;  . PARS PLANA VITRECTOMY Left 07/30/2013   Procedure: PARS PLANA VITRECTOMY WITH 23 GAUGE WITH ENDOLASER;  Surgeon: Shade Flood, MD;  Location: [facility] OR;  Service: Ophthalmology;  Laterality: Left;  with endolaser  . PERCUTANEOUS CORONARY STENT INTERVENTION (PCI-S) N/A 07/06/2011    Procedure: PERCUTANEOUS CORONARY STENT INTERVENTION (PCI-S);  Surgeon: Corky Crafts, MD;  Location: [facility] CATH LAB;  Service: Cardiovascular;  Laterality: N/A;  . PHOTOCOAGULATION WITH LASER Left 07/16/2013   Procedure: PHOTOCOAGULATION WITH LASER;  Surgeon: Shade Flood, MD;  Location: [facility] OR;  Service: Ophthalmology;  Laterality: Left;  ENDOLASER     OB History   None      Home Medications    Prior to Admission medications   Medication Sig Start Date End Date Taking? Authorizing Provider  Cephalexin 250 MG tablet 1 tab by mouth 4 times daily for 5 days 02/12/18  Yes Julieanne Manson, MD  clopidogrel (PLAVIX) 75 MG tablet Take 1 tablet (75 mg total) by mouth daily. 11/27/17  Yes Corky Crafts, MD  furosemide (LASIX) 40 MG tablet TAKE 1 TABLET BY MOUTH EVERY MORNING 07/16/17  Yes Julieanne Manson, MD  insulin aspart (NOVOLOG) 100 UNIT/ML injection Inject 4-16 Units into the skin 3 (three) times daily with meals. Sliding scale CBG 100-150; 4 units, 151-200; 6 units, 201-250; 8 units, 251-300; 10 units, 301-350; 12 units, 351-400; 14 units, >  401; call doctor   Yes [provider]  insulin glargine (LANTUS) 100 UNIT/ML injection Inject 10 Units into the skin 2 (two) times daily.    Yes [provider]  metoprolol tartrate (LOPRESSOR) 25 MG tablet Take 1 tablet (25 mg total) by mouth 2 (two) times daily. 08/29/17  Yes Julieanne Manson, MD  acetaminophen (TYLENOL) 500 MG tablet Take 500-1,000 mg by mouth every 6 (six) hours as needed for moderate pain.     [provider]  albuterol (PROVENTIL HFA;VENTOLIN HFA) 108 (90 Base) MCG/ACT inhaler Inhale 2 puffs into the lungs every 6 (six) hours as needed for wheezing or shortness of breath. 12/01/16   Julieanne Manson, MD  cyclobenzaprine (FLEXERIL) 10 MG tablet Take 1-2 tablets by mouth at bedtime as needed. Muscle spasms 09/21/16   [provider]  ferrous sulfate 325 (65 FE) MG tablet Take 1  tablet (325 mg total) by mouth daily with breakfast. 01/25/16   Julieanne Manson, MD  fexofenadine (ALLEGRA) 180 MG tablet Take 1 tablet (180 mg total) by mouth daily. Patient not taking: Reported on 12/07/2017 11/21/17   Julieanne Manson, MD  folic acid (FOLVITE) 1 MG tablet Take 1 mg by mouth daily.    [provider]  Incontinence Supply Disposable (POISE ULTRA THINS) PADS Use 5 pads daily as needed for urinary stress incontinence 01/03/16   Julieanne Manson, MD  methocarbamol (ROBAXIN) 500 MG tablet Take 1 tablet (500 mg total) by mouth 2 (two) times daily. 01/04/18   Kirichenko, Lemont Fillers, PA-C  methotrexate (RHEUMATREX) 2.5 MG tablet Take 4 tablets (10 mg total) by mouth 2 (two) times a week. Caution:Chemotherapy. Protect from light. 09/04/16   Julieanne Manson, MD  metoCLOPramide (REGLAN) 10 MG tablet Take 0.5 tablets (5 mg total) by mouth 4 (four) times daily -  before meals and at bedtime. Patient taking differently: Take 10 mg by mouth 4 (four) times daily -  before meals and at bedtime.  08/03/15   Julieanne Manson, MD  nitroGLYCERIN (NITROSTAT) 0.4 MG SL tablet Place 1 tablet (0.4 mg total) under the tongue every 5 (five) minutes as needed for chest pain. 05/29/17   Corky Crafts, MD  nystatin (MYCOSTATIN/NYSTOP) powder Apply to affected area 4 times daily for 7 days or until resolved 01/24/18   Julieanne Manson, MD  ondansetron (ZOFRAN) 4 MG tablet Take 4 mg by mouth as needed for nausea/vomiting.    [provider]  pantoprazole (PROTONIX) 40 MG tablet Take 1 tablet (40 mg total) by mouth 2 (two) times daily. 08/29/17   Julieanne Manson, MD  prednisoLONE acetate (PRED FORTE) 1 % ophthalmic suspension Place 4-5 drops into the left eye daily. 04/22/17   [provider]  rosuvastatin (CRESTOR) 40 MG tablet Take 1 tablet (40 mg total) by mouth daily. 11/27/17   Corky Crafts, MD  sertraline (ZOLOFT) 100 MG tablet 1 tab by mouth daily 02/14/16    Julieanne Manson, MD  sucralfate (CARAFATE) 1 g tablet Mix 1 tab in 10 cc water and drink 3 times daily before a meal and a fourth before bed Patient not taking: Reported on 02/12/2018 02/11/18   Julieanne Manson, MD  traMADol (ULTRAM) 50 MG tablet TAKE 1 TABLET EVERY 6 HOURS AS NEEDED FOR MODERATE PAIN 03/06/17   Julieanne Manson, MD    Family History Family History  Problem Relation Age of Onset  . Breast cancer Mother 25  . Hypertension Father   . Stomach cancer Sister   . Hypertension Sister   .  Lung cancer Sister   . Cancer Sister        ? stomach cancer  . Colon cancer Neg Hx     Social History Social History   Tobacco Use  . Smoking status: Current Every Day Smoker    Packs/day: 0.50    Years: 22.00    Pack years: 11.00    Types: Cigarettes  . Smokeless tobacco: Never Used  . Tobacco comment: Has been to classes--not sure if she wants to quit.  Changing to non menthol..  Chantix, patches, gum never helped.  Substance Use Topics  . Alcohol use: Yes    Alcohol/week: 0.0 oz    Comment: socially--holidays  . Drug use: No    Types: "Crack" cocaine, Marijuana    Comment: 12/31/2012 " clean from crack and marijuana since 2008"     Allergies   Aspirin   Review of Systems Review of Systems Ten systems reviewed and are negative for acute change, except as noted in the HPI.    Physical Exam Updated Vital Signs BP (!) 101/59   Pulse 81   Temp 98.3 F (36.8 C) (Oral)   Resp 18   Ht 5\' 1"  (1.549 m)   Wt 79.4 kg (175 lb)   LMP 01/12/2015   SpO2 100%   BMI 33.07 kg/m   Physical Exam  Constitutional: She is oriented to person, place, and time. She appears well-developed and well-nourished. No distress.  HENT:  Head: Normocephalic and atraumatic.  Eyes: Conjunctivae are normal. No scleral icterus.  Left eye blindness  Neck: Normal range of motion.  Cardiovascular: Normal rate, regular rhythm and normal heart sounds. Exam reveals no gallop and no friction  rub.  No murmur heard. Pulmonary/Chest: Effort normal and breath sounds normal. No stridor. No respiratory distress. She has no wheezes.  Abdominal: Soft. Bowel sounds are normal. She exhibits no distension and no mass. There is tenderness (diffuse). There is no guarding.  Musculoskeletal:  Left heel with 6cm bulla. Mild surrounding erythema without streaking.  Neurological: She is alert and oriented to person, place, and time.  Skin: Skin is warm and dry. She is not diaphoretic.  Psychiatric: Her behavior is normal.  Nursing note and vitals reviewed.    ED Treatments / Results  Labs (all labs ordered are listed, but only abnormal results are displayed) Labs Reviewed  CBG MONITORING, ED - Abnormal; Notable for the following components:      Result Value   Glucose-Capillary 108 (*)    All other components within normal limits  CBG MONITORING, ED    EKG EKG Interpretation  Date/Time:  Friday February 15 2018 10:15:36 EDT Ventricular Rate:  79 PR Interval:    QRS Duration: 95 QT Interval:  378 QTC Calculation: 434 R Axis:   84 Text Interpretation:  Sinus rhythm Inferior infarct, age indeterminate No significant change since last tracing Confirmed by Gwyneth Sprout ([id number]) on 02/15/2018 11:01:57 AM   Radiology No results found.  Procedures Procedures (including critical care time)  Medications Ordered in ED Medications - No data to display   Initial Impression / Assessment and Plan / ED Course  I have reviewed the triage vital signs and the nursing notes.  Pertinent labs & imaging results that were available during my care of the patient were reviewed by me and considered in my medical decision making (see chart for details).  Clinical Course as of Feb 15 1946  Fri Feb 15, 2018  1253 Patient with lucency in the Left  calcaneus concerning for the potential for osteomyelitis. The patient has had previous Osteomyleitis of the foot and ankle (unsure if L or R?) back in  2008.   [AH]    Clinical Course User Index [AH] Arthor Captain, PA-C   Patient with mild hypertension, potential for osteomyelitis of the left heel with infection of the skin.  Acute kidney injury present.  I spoken with Dr. Richardson Chiquito who will admit the patient to the hospital.  She is stable throughout her visit here is appropriate for admission.  Treated here with 30 mg/kg of saline and Vanc/Zosyn.  Final Clinical Impressions(s) / ED Diagnoses   Final diagnoses:  Left foot infection  Non-intractable vomiting with nausea, unspecified vomiting type  Cough  Hypokalemia  AKI (acute kidney injury) [facility])    ED Discharge Orders    None       Arthor Captain, PA-C 02/15/18 1948    Gwyneth Sprout, MD 02/16/18 [phone]

## 2018-02-15 NOTE — ED Notes (Signed)
Report given to Onalee Hua, RN. [id number]

## 2018-02-15 NOTE — Progress Notes (Signed)
Pharmacy Antibiotic Note  [name] [name] is a 52 y.o. female admitted on 02/15/2018 with L heel osteomyelitis.  Pharmacy has been consulted for vancomycin and cefepime dosing.  Pt transferred to [facility] from [facility].  Pt on Keflex PTA.  Vancomycin 1 gm given at [facility] @ 1346, zosyn 3.375 gm given @ 1325. WBC WNL,  SCr 1.64 - was 0.91 10/13/2016.    Plan: Cefepime 2 gm IV q24 Vancomycin 1 gm at [facility] + 1 gm now to make 2 gm loading dose followed by vanc 1250 mg IV q48 for AUC 534 using SCr 1.65, IBW/ABW F/u renal function, WBC, temp, culture data Vancomycin levels as needed  Height: 5\' 1"  (154.9 cm) Weight: 175 lb (79.4 kg) IBW/kg (Calculated) : 47.8  Temp (24hrs), Avg:98.3 F (36.8 C), Min:98.3 F (36.8 C), Max:98.3 F (36.8 C)  Recent Labs  Lab 02/15/18 1138 02/15/18 1203  WBC 9.0  --   CREATININE 1.64*  --   LATICACIDVEN  --  1.08    Estimated Creatinine Clearance: 38.7 mL/min (A) (by C-G formula based on SCr of 1.64 mg/dL (H)).    Allergies  Allergen Reactions  . Aspirin Other (See Comments)    Stomach bleeds Stomach bleeds GI bleeding Other reaction(s): Other (See Comments) GI bleeding Stomach bleeds     Antimicrobials this admission: 7/19 zosyn x 1 7/19 vanc>> 7/19 cefepime>> Dose adjustments this admission: Microbiology results: 7/19 Ucx> 7/19 HIV> 7/19 BCx2>>  Thank you for allowing pharmacy to be a part of this patient's care.  8/19, Pharm.D 02/15/2018 4:41 PM

## 2018-02-15 NOTE — H&P (Signed)
History and Physical    [name] [name] GBE:[id number] DOB: 05/02/1966 DOA: 02/15/2018  PCP: Mack Hook, MD   Patient coming from: Home  Chief Complaint: Foot Pain, Nausea, Vomiting and Lightheadedness  HPI: [name] [name] is a 52 y.o. female with medical history significant of above medical problems including history of MI and CAD that is post DES, bipolar affective disorder, depression, anxiety, diabetes mellitus type 2 complicated by multiple issues including diabetic retinopathy, gastroparesis, renal dysfunction, chronic kidney disease stage III, rheumatoid arthritis on methotrexate, and other comorbidities who presented to [facility] emergency room as a direct transfer from [facility] for chief complaint of left foot pain, nausea, vomiting and mild lightheadedness dizziness.  Patient states that she went to have a pedicure done last week and subsequently after that she developed foot pain and blistering on her heel.  She pointed her primary care physician who placed her on Keflex and patient states that subsequently after taking Keflex she started having nausea and vomiting.  States that she has not been eating very well and states that she is afraid to eat because of her nausea.  Says she feels hungry now.  This morning she states that she felt some dizziness and attributed it to her medication specifically her Keflex.  She states however she has been having significant left heel pain has been constant with some blistering.  She states that it is difficult to bear weight on it.  Patient's nausea and vomiting has now resolved and states that she feels hungry but states her foot pain is still there.  In the ED revealed that she has a subtle lucency involving the dorsal aspect of the posterior calcaneal cortex which was concerning for erosion secondary to ostium myelitis.  TRH was consulted for her left foot pain and she will be admitted to rule out osteomyelitis and she will start empiric  antibiotics with IV vancomycin IV cefepime  ED Course: Was given 2.5 L NS, started on IV vancomycin IV Zosyn, and had a basic blood work as well as a foot x-ray done.  Review of Systems: As per HPI otherwise 10 point review of systems negative.    Past Medical History:  Diagnosis Date  . Acute myocardial infarction of other lateral wall, initial episode of care   . Acute myocardial infarction, unspecified site, initial episode of care   . Acute osteomyelitis   . Allergic rhinitis   . Anemia   . Anginal pain ([place])    07/15/13- no chest pain in months"  . Anxiety   . Bipolar affective ([place])   . CAD (coronary artery disease) 12/12   s/p DES mid and distal RCA with 50% LAD  . Daily headache    not daily  . Depression    Bipolar disorder  . Diabetic retinopathy   . Esophageal stenosis   . Esophageal ulcer   . Esophagitis   . Gastroparesis   . Genital herpes    Reportedly tested and documented by [facility]--rare occurrences  . GERD (gastroesophageal reflux disease)   . Heart murmur   . History of stomach ulcers   . Hyperlipidemia   . Hypertension   . Inferior MI ([place]) 01/20/2009   Archie Endo on 12/19/2012, "that's the only one I've had" (12/19/2012)  . Migraines   . Orthopnoea   . Pneumonia 2012  . Polysubstance abuse ([place])    Crack cocaine--none since 2008, MJ, ETOH:  clean of all since 2008  . Renal insufficiency   .  Rheumatoid arthritis(714.0)   . Sebaceous cyst   . Stroke [facility]) 2011   denies residual on 12/19/2012.  "Years ago"  . Type II diabetes mellitus (HCC)    Previously uncontrolled for many years with multiple complications.  2017 controlled. 05/2016:  6.7%   Past Surgical History:  Procedure Laterality Date  . CORONARY ANGIOPLASTY WITH STENT PLACEMENT  01/20/2009   "2" (12/19/2012)  . CORONARY ANGIOPLASTY WITH STENT PLACEMENT  2012   "2" (12/19/2012)  . CORONARY ANGIOPLASTY WITH STENT PLACEMENT  12/19/2012   "2" (12/19/2012)  . ESOPHAGOGASTRODUODENOSCOPY N/A  02/26/2015   Procedure: ESOPHAGOGASTRODUODENOSCOPY (EGD);  Surgeon: Teena Irani, MD;  Location[facility] ENDOSCOPY;  Service: Endoscopy;  Laterality: N/A;  . EYE SURGERY     Multiple surgeries of both eyes:  last laser was 09/08/2015 of right eye.  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 GAS INSERTION Left 07/16/2013   Procedure: INSERTION OF GAS;  Surgeon: Adonis Brook, MD;  Location: [place];  Service: Ophthalmology;  Laterality: Left;  SF6  . GAS/FLUID EXCHANGE Left 07/30/2013   Procedure: GAS/FLUID EXCHANGE;  Surgeon: Adonis Brook, MD;  Location: [place];  Service: Ophthalmology;  Laterality: Left;  . IRRIGATION AND DEBRIDEMENT SEBACEOUS CYST Right 03/2011   "pointer" (12/19/2012)  . LEFT HEART CATHETERIZATION WITH CORONARY ANGIOGRAM N/A 07/06/2011   Procedure: LEFT HEART CATHETERIZATION WITH CORONARY ANGIOGRAM;  Surgeon: Jettie Booze, MD;  Location: [facility] CATH LAB;  Service: Cardiovascular;  Laterality: N/A;  possible PCI  . LEFT HEART CATHETERIZATION WITH CORONARY ANGIOGRAM N/A 12/19/2012   Procedure: LEFT HEART CATHETERIZATION WITH CORONARY ANGIOGRAM;  Surgeon: Jettie Booze, MD;  Location: [facility] CATH LAB;  Service: Cardiovascular;  Laterality: N/A;  . MEMBRANE PEEL Left 07/16/2013   Procedure: MEMBRANE PEEL;  Surgeon: Adonis Brook, MD;  Location: [place];  Service: Ophthalmology;  Laterality: Left;  . PARS PLANA VITRECTOMY Left 07/16/2013   Procedure: PARS PLANA VITRECTOMY WITH 23 GAUGE;  Surgeon: Adonis Brook, MD;  Location: [place];  Service: Ophthalmology;  Laterality: Left;  . PARS PLANA VITRECTOMY Left 07/30/2013   Procedure: PARS PLANA VITRECTOMY WITH 23 GAUGE WITH ENDOLASER;  Surgeon: Adonis Brook, MD;  Location: [place];  Service: Ophthalmology;  Laterality: Left;  with endolaser  . PERCUTANEOUS CORONARY STENT INTERVENTION (PCI-S) N/A 07/06/2011   Procedure: PERCUTANEOUS CORONARY STENT INTERVENTION (PCI-S);  Surgeon: Jettie Booze, MD;  Location: [facility] CATH LAB;   Service: Cardiovascular;  Laterality: N/A;  . PHOTOCOAGULATION WITH LASER Left 07/16/2013   Procedure: PHOTOCOAGULATION WITH LASER;  Surgeon: Adonis Brook, MD;  Location: [place];  Service: Ophthalmology;  Laterality: Left;  ENDOLASER   SOCIAL HISTORY  reports that she has been smoking cigarettes.  She has a 11.00 pack-year smoking history. She has never used smokeless tobacco. She reports that she drinks alcohol. She reports that she does not use drugs.  Allergies  Allergen Reactions  . Aspirin Other (See Comments)    Stomach bleeds Stomach bleeds GI bleeding Other reaction(s): Other (See Comments) GI bleeding Stomach bleeds    Family History  Problem Relation Age of Onset  . Breast cancer Mother 58  . Hypertension Father   . Stomach cancer Sister   . Hypertension Sister   . Lung cancer Sister   . Cancer Sister        ? stomach cancer  . Colon cancer Neg Hx    Prior to Admission medications   Medication Sig Start Date End Date Taking? Authorizing Provider  Cephalexin 250 MG tablet 1 tab by mouth 4 times daily for 5 days 02/12/18  Yes Mack Hook, MD  clopidogrel (PLAVIX) 75 MG tablet Take 1 tablet (75 mg total) by mouth daily. 11/27/17  Yes Jettie Booze, MD  furosemide (LASIX) 40 MG tablet TAKE 1 TABLET BY MOUTH EVERY MORNING 07/16/17  Yes Mack Hook, MD  insulin aspart (NOVOLOG) 100 UNIT/ML injection Inject 4-16 Units into the skin 3 (three) times daily with meals. Sliding scale CBG 100-150; 4 units, 151-200; 6 units, 201-250; 8 units, 251-300; 10 units, 301-350; 12 units, 351-400; 14 units, > 401; call doctor   Yes [provider]  insulin glargine (LANTUS) 100 UNIT/ML injection Inject 10 Units into the skin 2 (two) times daily.    Yes [provider]  metoprolol tartrate (LOPRESSOR) 25 MG tablet Take 1 tablet (25 mg total) by mouth 2 (two) times daily. 08/29/17  Yes Mack Hook, MD  acetaminophen (TYLENOL) 500 MG tablet Take  500-1,000 mg by mouth every 6 (six) hours as needed for moderate pain.     [provider]  albuterol (PROVENTIL HFA;VENTOLIN HFA) 108 (90 Base) MCG/ACT inhaler Inhale 2 puffs into the lungs every 6 (six) hours as needed for wheezing or shortness of breath. 12/01/16   Mack Hook, MD  cyclobenzaprine (FLEXERIL) 10 MG tablet Take 1-2 tablets by mouth at bedtime as needed. Muscle spasms 09/21/16   [provider]  ferrous sulfate 325 (65 FE) MG tablet Take 1 tablet (325 mg total) by mouth daily with breakfast. 01/25/16   Mack Hook, MD  fexofenadine (ALLEGRA) 180 MG tablet Take 1 tablet (180 mg total) by mouth daily. Patient not taking: Reported on 12/07/2017 11/21/17   Mack Hook, MD  folic acid (FOLVITE) 1 MG tablet Take 1 mg by mouth daily.    [provider]  Incontinence Supply Disposable (POISE ULTRA THINS) PADS Use 5 pads daily as needed for urinary stress incontinence 01/03/16   Mack Hook, MD  methocarbamol (ROBAXIN) 500 MG tablet Take 1 tablet (500 mg total) by mouth 2 (two) times daily. 01/04/18   Kirichenko, Lahoma Rocker, PA-C  methotrexate (RHEUMATREX) 2.5 MG tablet Take 4 tablets (10 mg total) by mouth 2 (two) times a week. Caution:Chemotherapy. Protect from light. 09/04/16   Mack Hook, MD  metoCLOPramide (REGLAN) 10 MG tablet Take 0.5 tablets (5 mg total) by mouth 4 (four) times daily -  before meals and at bedtime. Patient taking differently: Take 10 mg by mouth 4 (four) times daily -  before meals and at bedtime.  08/03/15   Mack Hook, MD  nitroGLYCERIN (NITROSTAT) 0.4 MG SL tablet Place 1 tablet (0.4 mg total) under the tongue every 5 (five) minutes as needed for chest pain. 05/29/17   Jettie Booze, MD  nystatin (MYCOSTATIN/NYSTOP) powder Apply to affected area 4 times daily for 7 days or until resolved 01/24/18   Mack Hook, MD  ondansetron (ZOFRAN) 4 MG tablet Take 4 mg by mouth as needed for  nausea/vomiting.    [provider]  pantoprazole (PROTONIX) 40 MG tablet Take 1 tablet (40 mg total) by mouth 2 (two) times daily. 08/29/17   Mack Hook, MD  prednisoLONE acetate (PRED FORTE) 1 % ophthalmic suspension Place 4-5 drops into the left eye daily. 04/22/17   [provider]  rosuvastatin (CRESTOR) 40 MG tablet Take 1 tablet (40 mg total) by mouth daily. 11/27/17   Jettie Booze, MD  sertraline (ZOLOFT) 100 MG tablet 1 tab by mouth  daily 02/14/16   Mack Hook, MD  sucralfate (CARAFATE) 1 g tablet Mix 1 tab in 10 cc water and drink 3 times daily before a meal and a fourth before bed Patient not taking: Reported on 02/12/2018 02/11/18   Mack Hook, MD  traMADol (ULTRAM) 50 MG tablet TAKE 1 TABLET EVERY 6 HOURS AS NEEDED FOR MODERATE PAIN 03/06/17   Mack Hook, MD   Physical Exam: Vitals:   02/15/18 1300 02/15/18 1400 02/15/18 1455 02/15/18 1541  BP: (!) 100/54 (!) 107/52 (!) 100/48 132/60  Pulse: 93  87 89  Resp: (!) 21 (!) 27 20   Temp:      TempSrc:      SpO2: 95%  96% 98%  Weight:      Height:       Constitutional: WN/WD obese AAF who is tearful and very anxious Eyes: sclerae anicteric; Has some lid drooping on Left ENMT: External Ears, Nose appear normal. Grossly normal hearing. Mucous membranes are moist.  Neck: Appears normal, supple, no cervical masses, normal ROM, no appreciable thyromegaly, no JVD Respiratory: Diminished to auscultation bilaterally, no wheezing, rales, rhonchi or crackles. Normal respiratory effort and patient is not tachypenic. No accessory muscle use.  Cardiovascular: RRR, no murmurs / rubs / gallops. S1 and S2 auscultated. No extremity edema Abdomen: Soft, non-tender, Distended due to body habitus. No masses palpated. No appreciable hepatosplenomegaly. Bowel sounds positive x4  GU: Deferred. Musculoskeletal: No clubbing / cyanosis of digits/nails. No joint deformity upper and lower  extremities. Skin: Has skin blistering on Left Heel which is painful to palpate. Not as warm or erythematous.  Neurologic: CN 2-12 grossly intact with no focal deficits. Romberg and sign cerebellar reflexes not assessed.  Psychiatric: Normal judgment and insight. Alert and oriented x 3. Tearful and anxious mood and appropriate affect.   Labs on Admission: I have personally reviewed following labs and imaging studies  CBC: Recent Labs  Lab 02/15/18 1138  WBC 9.0  NEUTROABS 7.3  HGB 12.2  HCT 37.3  MCV 92.6  PLT 008   Basic Metabolic Panel: Recent Labs  Lab 02/15/18 1138  NA 142  K 3.1*  CL 105  CO2 24  GLUCOSE 116*  BUN 17  CREATININE 1.64*  CALCIUM 9.3   GFR: Estimated Creatinine Clearance: 38.7 mL/min (A) (by C-G formula based on SCr of 1.64 mg/dL (H)). Liver Function Tests: Recent Labs  Lab 02/15/18 1138  AST 17  ALT 7  ALKPHOS 131*  BILITOT 0.5  PROT 8.4*  ALBUMIN 3.6   Recent Labs  Lab 02/15/18 1138  LIPASE 21   No results for input(s): AMMONIA in the last 168 hours. Coagulation Profile: No results for input(s): INR, PROTIME in the last 168 hours. Cardiac Enzymes: Recent Labs  Lab 02/15/18 1138  TROPONINI <0.03   BNP (last 3 results) No results for input(s): PROBNP in the last 8760 hours. HbA1C: No results for input(s): HGBA1C in the last 72 hours. CBG: Recent Labs  Lab 02/15/18 1008  GLUCAP 108*   Lipid Profile: No results for input(s): CHOL, HDL, LDLCALC, TRIG, CHOLHDL, LDLDIRECT in the last 72 hours. Thyroid Function Tests: No results for input(s): TSH, T4TOTAL, FREET4, T3FREE, THYROIDAB in the last 72 hours. Anemia Panel: No results for input(s): VITAMINB12, FOLATE, FERRITIN, TIBC, IRON, RETICCTPCT in the last 72 hours. Urine analysis:    Component Value Date/Time   COLORURINE YELLOW 02/15/2018 1138   APPEARANCEUR CLEAR 02/15/2018 1138   LABSPEC 1.020 02/15/2018 1138   PHURINE 6.0  02/15/2018 [place] 02/15/2018  1138   HGBUR SMALL (A) 02/15/2018 1138   HGBUR trace-intact 08/25/2010 0841   BILIRUBINUR SMALL (A) 02/15/2018 1138   KETONESUR 15 (A) 02/15/2018 1138   PROTEINUR 100 (A) 02/15/2018 1138   UROBILINOGEN 1.0 02/21/2015 0432   NITRITE NEGATIVE 02/15/2018 1138   LEUKOCYTESUR TRACE (A) 02/15/2018 1138   Sepsis Labs: !!!!!!!!!!!!!!!!!!!!!!!!!!!!!!!!!!!!!!!!!!!! '@[email or web address]' (procalcitonin:4,lacticidven:4) ) Recent Results (from the past 240 hour(s))  Blood Culture (routine x 2)     Status: None (Preliminary result)   Collection Time: 02/15/18 11:35 AM  Result Value Ref Range Status   Specimen Description BLOOD RIGHT WRIST  Final   Special Requests   Final    BOTTLES DRAWN AEROBIC ONLY Blood Culture results may not be optimal due to an inadequate volume of blood received in culture bottles Performed at [facility], [address] [place], [place] [address]    Culture PENDING  Incomplete   Report Status PENDING  Incomplete     Radiological Exams on Admission: Dg Chest 2 View  Result Date: 02/15/2018 CLINICAL DATA:  Cough EXAM: CHEST - 2 VIEW COMPARISON:  01/04/2018 FINDINGS: Normal heart size, mediastinal contours, and pulmonary vascularity. Lungs clear. No pleural effusion or pneumothorax. Bones unremarkable. IMPRESSION: Normal exam. Electronically Signed   By: Lavonia Dana M.D.   On: 02/15/2018 12:18   Dg Foot Complete Left  Result Date: 02/15/2018 CLINICAL DATA:  Left foot pain. On antibiotics for infection on back of heel of left foot. EXAM: LEFT FOOT - COMPLETE 3+ VIEW COMPARISON:  09/24/2009 FINDINGS: There is mild soft tissue stranding identified within K[place]'s fat pad just above the calcaneus. A focal area of lucency along the dorsal surface of the calcaneus posteriorly is noted. This appears new when compared with comparison exam. The remainder of the foot appears intact without fracture or dislocation. IMPRESSION: 1. There is a subtle lucency involving the dorsal aspect of  the posterior calcaneal cortex. Cannot rule out erosion in this area secondary to osteomyelitis. Clinical correlation with direct site of infection is advised. If further imaging is clinically indicated then a contrast enhanced MRI may be helpful. Electronically Signed   By: Kerby Moors M.D.   On: 02/15/2018 12:27   EKG: Independently reviewed. Showed a Sinus Rhythm at a rate of 79 with no ST Elevation appreciated on my interpretation   Assessment/Plan Principal Problem:   Foot pain Active Problems:   Diabetic retinopathy ([place])   Diabetes mellitus with multiple complications ([place])   Anxiety state   HTN (hypertension)   GERD   RENAL INSUFFICIENCY, CHRONIC   Rheumatoid arthritis ([place])   Coronary atherosclerosis   Diabetic gastroparesis (HCC)   Hyperlipidemia   History of NSTEMI w/ DES to cfx May 2014   CKD (chronic kidney disease), stage III (HCC)   Acute osteomyelitis of left foot (HCC)   Nausea and vomiting   Hypokalemia  Left Heel and Foot Pain -Admit to Telemetry Inpatient  -DG left foot x-ray showed there is subtle lucency involving the dorsal aspect of the posterior calcaneal cortex and the radiologist could not rule out erosion area secondary to osteomyelitis -We will obtain MRI of the left foot without contrast given her kidney function -Patient was given 2.5 L boluses of normal saline in the ED and we will start maintenance IV fluid hydration with normal saline at rate of 75 mils per hour -Patient was also given IV vancomycin IV Zosyn -We will continue IV vancomycin for  now and start IV cefepime instead of Zosyn given her kidney function -Pain control with tramadol 50 mill grams p.o. every 6 PRN for moderate pain with methocarbamol 500 mg p.o. twice daily -CRP was 11.6 and ESR was 78 -Blood Cx x2 ordered on Admission -Antiemetics with Zofran 4 mg p.o./IV every 6 as needed nausea -WBC was 9.0 and LA was 1.08 -Continue to Monitor and Repeat CBC in AM  -Follow up on MRI  results and Blood Cx's -IF MRI confirms Osteomyelitis will need an Orthopedic Evaluation  AKI on CKD 3 -Prior Baseline appears to be ranging from 0.8-1.2 -BUN/Cr on Admission was 17/1.64 -Avoid Nephrotoxic Medications if possible -C/w IVF Hydration as Above -Hold Home Furosemide -Continue to Monitor and Trend Renal Function -Repeat CMP in AM   Nausea and Vomiting -Improved. Patient for nausea and vomiting was antibiotic mediated and she was afraid to eat because of nausea. -C/w Antiemetics as above -Continues to have nausea vomiting will obtain a CT of abdomen and pelvis  CAD/Hx of MI -C/w Clopidogrel 75 mg po Daily, Metoprolol 25 mg po BID, and Rosuvastatin 40 mg po Daily  -C/w NTG 0.4 mg SL q34mn PRN   Hypokalemia -Patient's K+ was 3.1 on Admission -Replete with po KCl 40 mEQ BID x2 Doses -Continue to Monitor and Replete as Necessary -Repeat CMP in AM   HTN  -C/w Home Medications including Metoprolol 25 mg po BID  HLD  -C/w Rosuvastatin 40 mg po Daily  Bipolar Disorder/Depression/Anxiety  -C/w Sertraline 100 mg po Daily   GERD/Hx of Ulcers/Hx of Gastritis/Esophagitis  -C/w Pantoprazole 40 mg po BID  Rheumatoid Arthritis -Hold Home Methotrexate in the setting of possible Osteomyelitis    Diabetes Mellitus with Hx of Diabetic Retinopathy and Gastroparesis  -C/w Home Lantus 10 units sq BID; Hold Home Sliding Scale and start patient on Moderate Novolog SSI AC/HS -Check HbA1c; Was previously uncontrolled  -C/w Home Pantoprazole 40 mg po BID and Metoclopramide 5 mg po TID before meals and bedtime  Hx of CVA -C/w Clopidogrel 75 mg po Daily and Rosuvastatin 40 mg po Daily   DVT prophylaxis: Heparin 5,000 units sq q8h Code Status: Full Code  Family Communication: Discussed with Husband and Daughter at bedside  Disposition Plan: Anticipate D/C to Home Environment in the next 24-48 hours Consults called: None Admission status: Inpatient Telemetry   Severity of  Illness: The appropriate patient status for this patient is INPATIENT. Inpatient status is judged to be reasonable and necessary in order to provide the required intensity of service to ensure the patient's safety. The patient's presenting symptoms, physical exam findings, and initial radiographic and laboratory data in the context of their chronic comorbidities is felt to place them at Sane risk for further clinical deterioration. Furthermore, it is not anticipated that the patient will be medically stable for discharge from the hospital within 2 midnights of admission. The following factors support the patient status of inpatient.   " The patient's presenting symptoms include foot pain, nausea and vomiting, mild lightheadedness. " The worrisome physical exam findings include Left Heel Blistering. " The initial radiographic and laboratory data are worrisome because of concern for bony erosion 2/2 to Osteomyelitis. " The chronic co-morbidities include HTN, HLD, Diabetes, CKD Stage 3, Hx of CAD s/p DES and Hx of MI, Gastroparesis, RA.   * I certify that at the point of admission it is my clinical judgment that the patient will require inpatient hospital care spanning beyond 2 midnights from the point  of admission due to [place] intensity of service, Morrical risk for further deterioration and [place] frequency of surveillance required.Kerney Elbe, D.O. [facility] Pager [phone]  If 7PM-7AM, please contact night-coverage [email or web address] Password [email or web address]  02/15/2018, 5:02 PM

## 2018-02-15 NOTE — ED Triage Notes (Signed)
Pt reports she has been on antibiotics since Tuesday for an infection on the back of her heel on her left foot. Pt reports when she woke up this morning she had nausea and vomiting.

## 2018-02-15 NOTE — ED Notes (Signed)
Pt c/o dizziness and is swaying in wheelchair. RN made aware. Pt able to transfer stand and pivot from locked wheelchair to lowered stretcher x2 MIN assist. Pt on cardiac monitor and auto VS

## 2018-02-16 ENCOUNTER — Inpatient Hospital Stay (HOSPITAL_COMMUNITY): Payer: Medicaid Other

## 2018-02-16 DIAGNOSIS — N179 Acute kidney failure, unspecified: Secondary | ICD-10-CM

## 2018-02-16 DIAGNOSIS — M79672 Pain in left foot: Secondary | ICD-10-CM

## 2018-02-16 LAB — URINALYSIS, ROUTINE W REFLEX MICROSCOPIC
Bilirubin Urine: NEGATIVE
KETONES UR: NEGATIVE mg/dL
Leukocytes, UA: NEGATIVE
Nitrite: NEGATIVE
Protein, ur: 30 mg/dL — AB
Specific Gravity, Urine: 1.013 (ref 1.005–1.030)
pH: 6 (ref 5.0–8.0)

## 2018-02-16 LAB — CBC WITH DIFFERENTIAL/PLATELET
BASOS PCT: 0 %
Basophils Absolute: 0 10*3/uL (ref 0.0–0.1)
Eosinophils Absolute: 0.2 10*3/uL (ref 0.0–0.7)
Eosinophils Relative: 2 %
HEMATOCRIT: 32.6 % — AB (ref 36.0–46.0)
HEMOGLOBIN: 10.4 g/dL — AB (ref 12.0–15.0)
LYMPHS ABS: 1.3 10*3/uL (ref 0.7–4.0)
Lymphocytes Relative: 21 %
MCH: 30.1 pg (ref 26.0–34.0)
MCHC: 31.9 g/dL (ref 30.0–36.0)
MCV: 94.2 fL (ref 78.0–100.0)
Monocytes Absolute: 0.2 10*3/uL (ref 0.1–1.0)
Monocytes Relative: 3 %
NEUTROS ABS: 4.6 10*3/uL (ref 1.7–7.7)
NEUTROS PCT: 74 %
Platelets: 344 10*3/uL (ref 150–400)
RBC: 3.46 MIL/uL — AB (ref 3.87–5.11)
RDW: 18.3 % — ABNORMAL HIGH (ref 11.5–15.5)
WBC: 6.2 10*3/uL (ref 4.0–10.5)

## 2018-02-16 LAB — PHOSPHORUS: PHOSPHORUS: 1.9 mg/dL — AB (ref 2.5–4.6)

## 2018-02-16 LAB — COMPREHENSIVE METABOLIC PANEL
ALT: 7 U/L (ref 0–44)
AST: 13 U/L — AB (ref 15–41)
Albumin: 2.8 g/dL — ABNORMAL LOW (ref 3.5–5.0)
Alkaline Phosphatase: 100 U/L (ref 38–126)
Anion gap: 6 (ref 5–15)
BUN: 14 mg/dL (ref 6–20)
CALCIUM: 8.7 mg/dL — AB (ref 8.9–10.3)
CHLORIDE: 110 mmol/L (ref 98–111)
CO2: 24 mmol/L (ref 22–32)
Creatinine, Ser: 1.3 mg/dL — ABNORMAL HIGH (ref 0.44–1.00)
GFR calc Af Amer: 54 mL/min — ABNORMAL LOW (ref >60)
GFR calc non Af Amer: 47 mL/min — ABNORMAL LOW (ref >60)
Glucose, Bld: 277 mg/dL — ABNORMAL HIGH (ref 70–99)
Potassium: 4.2 mmol/L (ref 3.5–5.1)
SODIUM: 140 mmol/L (ref 135–145)
Total Bilirubin: 0.4 mg/dL (ref 0.3–1.2)
Total Protein: 6.7 g/dL (ref 6.5–8.1)

## 2018-02-16 LAB — GLUCOSE, CAPILLARY
Glucose-Capillary: 249 mg/dL — ABNORMAL HIGH (ref 70–99)
Glucose-Capillary: 253 mg/dL — ABNORMAL HIGH (ref 70–99)
Glucose-Capillary: 177 mg/dL — ABNORMAL HIGH (ref 70–99)
Glucose-Capillary: 206 mg/dL — ABNORMAL HIGH (ref 70–99)

## 2018-02-16 LAB — RAPID URINE DRUG SCREEN, HOSP PERFORMED
AMPHETAMINES: NOT DETECTED
BENZODIAZEPINES: NOT DETECTED
Barbiturates: NOT DETECTED
COCAINE: NOT DETECTED
Opiates: NOT DETECTED
Tetrahydrocannabinol: NOT DETECTED

## 2018-02-16 LAB — TSH: TSH: 0.377 u[IU]/mL (ref 0.350–4.500)

## 2018-02-16 LAB — MAGNESIUM: MAGNESIUM: 2.3 mg/dL (ref 1.7–2.4)

## 2018-02-16 LAB — HIV ANTIBODY (ROUTINE TESTING W REFLEX): HIV SCREEN 4TH GENERATION: NONREACTIVE

## 2018-02-16 MED ORDER — SULFAMETHOXAZOLE-TRIMETHOPRIM 800-160 MG PO TABS
1.0000 | ORAL_TABLET | Freq: Two times a day (BID) | ORAL | Status: DC
  Administered 2018-02-16 – 2018-02-17 (×2): 1 via ORAL
  Filled 2018-02-16 (×3): qty 1

## 2018-02-16 MED ORDER — GADOBENATE DIMEGLUMINE 529 MG/ML IV SOLN
20.0000 mL | Freq: Once | INTRAVENOUS | Status: AC | PRN
  Administered 2018-02-16: 18 mL via INTRAVENOUS

## 2018-02-16 MED ORDER — CEPHALEXIN 500 MG PO CAPS
500.0000 mg | ORAL_CAPSULE | Freq: Three times a day (TID) | ORAL | Status: DC
  Administered 2018-02-16 – 2018-02-17 (×2): 500 mg via ORAL
  Filled 2018-02-16 (×3): qty 1

## 2018-02-16 NOTE — Progress Notes (Signed)
PROGRESS NOTE                                                                                                                                                                                                             Patient Demographics:    [name] [name], is a 52 y.o. female, DOB - Jun 26, 1966, WIO:[id number]  Admit date - 02/15/2018   Admitting Physician Merlene Laughter, DO  Outpatient Primary MD for the patient is Julieanne Manson, MD  LOS - 1   Chief Complaint  Patient presents with  . Emesis  . Dizziness       Brief Narrative   52 y.o. female with medical history significant of above medical problems including history of MI and CAD that is post DES, bipolar affective disorder, depression, anxiety, diabetes mellitus type 2 complicated by multiple issues including diabetic retinopathy, gastroparesis, renal dysfunction, chronic kidney disease stage III, rheumatoid arthritis on methotrexate, he presents to ED with left heel wound after she had a pedicure, x-ray with some lucency, admitted to rule out osteomyelitis.   Subjective:    [name] [name] today has, No headache, No chest pain, No abdominal pain - No Nausea, No new weakness tingling or numbness, No Cough - SOB.    Assessment  & Plan :    Principal Problem:   Foot pain Active Problems:   Diabetic retinopathy (HCC)   Diabetes mellitus with multiple complications (HCC)   Anxiety state   HTN (hypertension)   GERD   RENAL INSUFFICIENCY, CHRONIC   Rheumatoid arthritis (HCC)   Coronary atherosclerosis   Diabetic gastroparesis (HCC)   Hyperlipidemia   History of NSTEMI w/ DES to cfx May 2014   CKD (chronic kidney disease), stage III (HCC)   Acute osteomyelitis of left foot (HCC)   Nausea and vomiting   Hypokalemia   Left heel wound/blister -It seems he sustained this blister/wound after she has pedicures done with some scrubbing of her feet, she is afebrile, no leukocytosis, but given  she is diabetic I will continue with IV antibiotics today, until to myelitis is ruled out, especially with abnormal foot x-ray with some lucency.  AKI on CKD 3 -Baseline creatinine 0.9-1.2, it was 1.6 on admission, it is improving, continue to hold Lasix.  Nausea and Vomiting -Improved  CAD/Hx of MI -She denies any chest pain or  shortness of breath, continue with Plavix, metoprolol, and statin   Hypokalemia -Repleted  HTN  -C/w Home Medications including Metoprolol 25 mg po BID  HLD  -C/w Rosuvastatin 40 mg po Daily  Bipolar Disorder/Depression/Anxiety  -C/w Sertraline 100 mg po Daily   GERD/Hx of Ulcers/Hx of Gastritis/Esophagitis  -C/w Pantoprazole 40 mg po BID  Rheumatoid Arthritis -Hold Home Methotrexate in the setting of possible Osteomyelitis    Diabetes Mellitus with Hx of Diabetic Retinopathy and Gastroparesis  -Recent A1c 7.3, CBG is labile, 67 yesterday, 300 this morning, continue with home dose Lantus 10 units twice daily, continue with moderate scale SSI [name] [name] as well, will adjust Lantus if CBG are persistently elevated . -C/w Home Pantoprazole 40 mg po BID and Metoclopramide 5 mg po TID before meals and bedtime  Hx of CVA -C/w Clopidogrel 75 mg po Daily and Rosuvastatin 40 mg po Daily       Code Status :  Full  Family Communication  : none at bedside  Disposition Plan  : home when stable  Consults  :  none  Procedures  : none  DVT Prophylaxis  :  [place] heparin  Lab Results  Component Value Date   PLT 344 02/16/2018    Antibiotics  :    Anti-infectives (From admission, onward)   Start     Dose/Rate Route Frequency Ordered Stop   02/17/18 1600  vancomycin (VANCOCIN) 1,250 mg in sodium chloride 0.9 % 250 mL IVPB     1,250 mg 166.7 mL/hr over 90 Minutes Intravenous Every 48 hours 02/15/18 1640     02/15/18 1800  ceFEPIme (MAXIPIME) 2 g in sodium chloride 0.9 % 100 mL IVPB     2 g 200 mL/hr over 30 Minutes Intravenous Every 24 hours  02/15/18 1634     02/15/18 1730  vancomycin (VANCOCIN) IVPB 1000 mg/200 mL premix     1,000 mg 200 mL/hr over 60 Minutes Intravenous  Once 02/15/18 1639 02/15/18 2147   02/15/18 1330  vancomycin (VANCOCIN) IVPB 1000 mg/200 mL premix     1,000 mg 200 mL/hr over 60 Minutes Intravenous  Once 02/15/18 1322 02/15/18 1457   02/15/18 1330  piperacillin-tazobactam (ZOSYN) IVPB 3.375 g     3.375 g 100 mL/hr over 30 Minutes Intravenous  Once 02/15/18 1322 02/15/18 1346        Objective:   Vitals:   02/15/18 1541 02/15/18 2154 02/16/18 0500 02/16/18 0512  BP: 132/60 (!) 126/56  128/60  Pulse: 89 88  73  Resp:  16  18  Temp:  98.1 F (36.7 C)  98.3 F (36.8 C)  TempSrc:  Oral  Oral  SpO2: 98% 96%  99%  Weight:   84 kg (185 lb 3 oz)   Height:        Wt Readings from Last 3 Encounters:  02/16/18 84 kg (185 lb 3 oz)  02/12/18 79.4 kg (175 lb)  01/24/18 80.3 kg (177 lb)     Intake/Output Summary (Last 24 hours) at 02/16/2018 1106 Last data filed at 02/16/2018 1053 Gross per 24 hour  Intake 3800.83 ml  Output -  Net 3800.83 ml     Physical Exam  Awake Alert, Oriented X 3, No new F.N deficits, Normal affect  Symmetrical Chest wall movement, Good air movement bilaterally, CTAB RRR,No Gallops,Rubs or new Murmurs, No Parasternal Heave +ve B.Sounds, Abd Soft, No tenderness, No rebound - guarding or rigidity. No Cyanosis, Clubbing or edema,left heel blister.    Data Review:  CBC Recent Labs  Lab 02/15/18 1138 02/15/18 1728 02/16/18 0614  WBC 9.0 7.5 6.2  HGB 12.2 11.4* 10.4*  HCT 37.3 35.8* 32.6*  PLT 385 374 344  MCV 92.6 94.2 94.2  MCH 30.3 30.0 30.1  MCHC 32.7 31.8 31.9  RDW 18.3* 18.4* 18.3*  LYMPHSABS 1.0  --  1.3  MONOABS 0.5  --  0.2  EOSABS 0.2  --  0.2  BASOSABS 0.0  --  0.0    Chemistries  Recent Labs  Lab 02/15/18 1138 02/15/18 1728 02/16/18 0614  NA 142  --  140  K 3.1*  --  4.2  CL 105  --  110  CO2 24  --  24  GLUCOSE 116*  --  277*  BUN  17  --  14  CREATININE 1.64* 1.43* 1.30*  CALCIUM 9.3  --  8.7*  MG  --   --  2.3  AST 17  --  13*  ALT 7  --  7  ALKPHOS 131*  --  100  BILITOT 0.5  --  0.4   ------------------------------------------------------------------------------------------------------------------ No results for input(s): CHOL, HDL, LDLCALC, TRIG, CHOLHDL, LDLDIRECT in the last 72 hours.  Lab Results  Component Value Date   HGBA1C 7.3 (H) 02/15/2018   ------------------------------------------------------------------------------------------------------------------ Recent Labs    02/16/18 0614  TSH 0.377   ------------------------------------------------------------------------------------------------------------------ No results for input(s): VITAMINB12, FOLATE, FERRITIN, TIBC, IRON, RETICCTPCT in the last 72 hours.  Coagulation profile No results for input(s): INR, PROTIME in the last 168 hours.  No results for input(s): DDIMER in the last 72 hours.  Cardiac Enzymes Recent Labs  Lab 02/15/18 1138  TROPONINI <0.03   ------------------------------------------------------------------------------------------------------------------ No results found for: BNP  Inpatient Medications  Scheduled Meds: . clopidogrel  75 mg Oral Daily  . ferrous sulfate  325 mg Oral Q breakfast  . folic acid  1 mg Oral Daily  . heparin  5,000 Units Subcutaneous Q8H  . insulin aspart  0-15 Units Subcutaneous TID WC  . insulin aspart  0-5 Units Subcutaneous QHS  . insulin glargine  10 Units Subcutaneous BID  . metoCLOPramide  5 mg Oral TID AC & HS  . metoprolol tartrate  25 mg Oral BID  . pantoprazole  40 mg Oral BID  . prednisoLONE acetate  4-5 drop Left Eye Daily  . rosuvastatin  40 mg Oral Daily  . sertraline  100 mg Oral Daily   Continuous Infusions: . sodium chloride Stopped (02/16/18 1053)  . ceFEPime (MAXIPIME) IV Stopped (02/15/18 1937)  . [START ON 02/17/2018] vancomycin     PRN Meds:.acetaminophen  **OR** acetaminophen, nitroGLYCERIN, ondansetron **OR** ondansetron (ZOFRAN) IV, senna-docusate, traMADol  Micro Results Recent Results (from the past 240 hour(s))  Blood Culture (routine x 2)     Status: None (Preliminary result)   Collection Time: 02/15/18 11:35 AM  Result Value Ref Range Status   Specimen Description BLOOD RIGHT WRIST  Final   Special Requests   Final    BOTTLES DRAWN AEROBIC ONLY Blood Culture results may not be optimal due to an inadequate volume of blood received in culture bottles Performed at [facility], [address] [place], [place] [address]    Culture PENDING  Incomplete   Report Status PENDING  Incomplete    Radiology Reports Dg Chest 2 View  Result Date: 02/15/2018 CLINICAL DATA:  Cough EXAM: CHEST - 2 VIEW COMPARISON:  01/04/2018 FINDINGS: Normal heart size, mediastinal contours, and pulmonary vascularity. Lungs clear. No pleural  effusion or pneumothorax. Bones unremarkable. IMPRESSION: Normal exam. Electronically Signed   By: Ulyses Southward M.D.   On: 02/15/2018 12:18   Dg Foot Complete Left  Result Date: 02/15/2018 CLINICAL DATA:  Left foot pain. On antibiotics for infection on back of heel of left foot. EXAM: LEFT FOOT - COMPLETE 3+ VIEW COMPARISON:  09/24/2009 FINDINGS: There is mild soft tissue stranding identified within [place]'s fat pad just above the calcaneus. A focal area of lucency along the dorsal surface of the calcaneus posteriorly is noted. This appears new when compared with comparison exam. The remainder of the foot appears intact without fracture or dislocation. IMPRESSION: 1. There is a subtle lucency involving the dorsal aspect of the posterior calcaneal cortex. Cannot rule out erosion in this area secondary to osteomyelitis. Clinical correlation with direct site of infection is advised. If further imaging is clinically indicated then a contrast enhanced MRI may be helpful. Electronically Signed   By: Signa Kell M.D.   On:  02/15/2018 12:27     Huey Bienenstock M.D on 02/16/2018 at 11:06 AM  Between 7am to 7pm - Pager - [phone]  After 7pm go to [email or web address] - password [facility]  [facility] -  Office  [phone]

## 2018-02-17 DIAGNOSIS — L03116 Cellulitis of left lower limb: Principal | ICD-10-CM

## 2018-02-17 LAB — BASIC METABOLIC PANEL
ANION GAP: 9 (ref 5–15)
BUN: 14 mg/dL (ref 6–20)
CHLORIDE: 106 mmol/L (ref 98–111)
CO2: 24 mmol/L (ref 22–32)
Calcium: 9.1 mg/dL (ref 8.9–10.3)
Creatinine, Ser: 1.19 mg/dL — ABNORMAL HIGH (ref 0.44–1.00)
GFR calc non Af Amer: 52 mL/min — ABNORMAL LOW (ref >60)
Glucose, Bld: 293 mg/dL — ABNORMAL HIGH (ref 70–99)
POTASSIUM: 3.8 mmol/L (ref 3.5–5.1)
SODIUM: 139 mmol/L (ref 135–145)

## 2018-02-17 LAB — CBC
HEMATOCRIT: 39.1 % (ref 36.0–46.0)
HEMOGLOBIN: 12.3 g/dL (ref 12.0–15.0)
MCH: 29.2 pg (ref 26.0–34.0)
MCHC: 31.5 g/dL (ref 30.0–36.0)
MCV: 92.9 fL (ref 78.0–100.0)
Platelets: 372 10*3/uL (ref 150–400)
RBC: 4.21 MIL/uL (ref 3.87–5.11)
RDW: 18.3 % — ABNORMAL HIGH (ref 11.5–15.5)
WBC: 5.3 10*3/uL (ref 4.0–10.5)

## 2018-02-17 LAB — URINE CULTURE: CULTURE: NO GROWTH

## 2018-02-17 LAB — GLUCOSE, CAPILLARY: GLUCOSE-CAPILLARY: 278 mg/dL — AB (ref 70–99)

## 2018-02-17 MED ORDER — K PHOS MONO-SOD PHOS DI & MONO 155-852-130 MG PO TABS
500.0000 mg | ORAL_TABLET | Freq: Three times a day (TID) | ORAL | Status: DC
  Administered 2018-02-17: 500 mg via ORAL
  Filled 2018-02-17 (×2): qty 2

## 2018-02-17 MED ORDER — SULFAMETHOXAZOLE-TRIMETHOPRIM 800-160 MG PO TABS: 1.0000 | ORAL_TABLET | Freq: Two times a day (BID) | ORAL | 0 refills | Status: DC

## 2018-02-17 MED ORDER — CEPHALEXIN 500 MG PO CAPS: 500.0000 mg | ORAL_CAPSULE | Freq: Three times a day (TID) | ORAL | 0 refills | Status: DC

## 2018-02-17 NOTE — Discharge Instructions (Signed)
Follow with Primary MD Julieanne Manson, MD in 7 days   Get CBC, CMP,checked  by Primary MD next visit.    Activity: As tolerated with Full fall precautions use walker/cane & assistance as needed   Disposition Home    Diet: Heart Healthy, carbohydrate modified, with feeding assistance and aspiration precautions.  For Heart failure patients - Check your Weight same time everyday, if you gain over 2 pounds, or you develop in leg swelling, experience more shortness of breath or chest pain, call your Primary MD immediately. Follow Cardiac Low Salt Diet and 1.5 lit/day fluid restriction.   On your next visit with your primary care physician please Get Medicines reviewed and adjusted.   Please request your Prim.MD to go over all Hospital Tests and Procedure/Radiological results at the follow up, please get all Hospital records sent to your Prim MD by signing hospital release before you go home.   If you experience worsening of your admission symptoms, develop shortness of breath, life threatening emergency, suicidal or homicidal thoughts you must seek medical attention immediately by calling 911 or calling your MD immediately  if symptoms less severe.  You Must read complete instructions/literature along with all the possible adverse reactions/side effects for all the Medicines you take and that have been prescribed to you. Take any new Medicines after you have completely understood and accpet all the possible adverse reactions/side effects.   Do not drive, operating heavy machinery, perform activities at heights, swimming or participation in water activities or provide baby sitting services if your were admitted for syncope or siezures until you have seen by Primary MD or a Neurologist and advised to do so again.  Do not drive when taking Pain medications.    Do not take more than prescribed Pain, Sleep and Anxiety Medications  Special Instructions: If you have smoked or chewed Tobacco   in the last 2 yrs please stop smoking, stop any regular Alcohol  and or any Recreational drug use.  Wear Seat belts while driving.   Please note  You were cared for by a hospitalist during your hospital stay. If you have any questions about your discharge medications or the care you received while you were in the hospital after you are discharged, you can call the unit and asked to speak with the hospitalist on call if the hospitalist that took care of you is not available. Once you are discharged, your primary care physician will handle any further medical issues. Please note that NO REFILLS for any discharge medications will be authorized once you are discharged, as it is imperative that you return to your primary care physician (or establish a relationship with a primary care physician if you do not have one) for your aftercare needs so that they can reassess your need for medications and monitor your lab values.

## 2018-02-17 NOTE — Discharge Summary (Addendum)
[name] [name], is a 52 y.o. female  DOB [birth year]  MRN [id number].  Admission date:  02/15/2018  Admitting Physician  Merlene Laughter, DO  Discharge Date:  02/17/2018   Primary MD  Julieanne Manson, MD  Recommendations for primary care physician for things to follow:  - please check CBC , BMP during next visit   Admission Diagnosis  Cough [R05] Hypokalemia [E87.6] Left foot infection [L08.9] Non-intractable vomiting with nausea, unspecified vomiting type [R11.2]   Discharge Diagnosis  Cough [R05] Hypokalemia [E87.6] Left foot infection [L08.9] Non-intractable vomiting with nausea, unspecified vomiting type [R11.2]    Principal Problem:   Foot pain Active Problems:   Diabetic retinopathy (HCC)   Diabetes mellitus with multiple complications (HCC)   Anxiety state   HTN (hypertension)   GERD   RENAL INSUFFICIENCY, CHRONIC   Rheumatoid arthritis (HCC)   Coronary atherosclerosis   Diabetic gastroparesis (HCC)   Hyperlipidemia   History of NSTEMI w/ DES to cfx May 2014   CKD (chronic kidney disease), stage III (HCC)   Acute osteomyelitis of left foot (HCC)   Nausea and vomiting   Hypokalemia      Past Medical History:  Diagnosis Date  . Acute myocardial infarction of other lateral wall, initial episode of care   . Acute myocardial infarction, unspecified site, initial episode of care   . Acute osteomyelitis   . Allergic rhinitis   . Anemia   . Anginal pain (HCC)    07/15/13- no chest pain in months"  . Anxiety   . Bipolar affective (HCC)   . CAD (coronary artery disease) 12/12   s/p DES mid and distal RCA with 50% LAD  . Daily headache    not daily  . Depression    Bipolar disorder  . Diabetic retinopathy   . Esophageal stenosis   . Esophageal ulcer   . Esophagitis   . Gastroparesis   . Genital herpes    Reportedly tested and documented by [facility]--rare occurrences    . GERD (gastroesophageal reflux disease)   . Heart murmur   . History of stomach ulcers   . Hyperlipidemia   . Hypertension   . 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 Migraines   . Orthopnoea   . Pneumonia 2012  . Polysubstance abuse (HCC)    Crack cocaine--none since 2008, MJ, ETOH:  clean of all since 2008  . Renal insufficiency   . Rheumatoid arthritis(714.0)   . Sebaceous cyst   . Stroke [facility]) 2011   denies residual on 12/19/2012.  "Years ago"  . Type II diabetes mellitus (HCC)    Previously uncontrolled for many years with multiple complications.  2017 controlled. 05/2016:  6.7%    Past Surgical History:  Procedure Laterality Date  . CORONARY ANGIOPLASTY WITH STENT PLACEMENT  01/20/2009   "2" (12/19/2012)  . CORONARY ANGIOPLASTY WITH STENT PLACEMENT  2012   "2" (12/19/2012)  . CORONARY ANGIOPLASTY WITH STENT PLACEMENT  12/19/2012   "2" (12/19/2012)  . ESOPHAGOGASTRODUODENOSCOPY N/A 02/26/2015   Procedure: ESOPHAGOGASTRODUODENOSCOPY (EGD);  Surgeon: Dorena Cookey, MD;  Location: [facility] ENDOSCOPY;  Service: Endoscopy;  Laterality: N/A;  . EYE SURGERY     Multiple surgeries of both eyes:  last laser was 09/08/2015 of right eye.  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 GAS INSERTION Left 07/16/2013   Procedure: INSERTION OF GAS;  Surgeon: Shade Flood, MD;  Location: [facility] OR;  Service: Ophthalmology;  Laterality: Left;  SF6  . GAS/FLUID EXCHANGE Left 07/30/2013   Procedure: GAS/FLUID EXCHANGE;  Surgeon: Shade Flood, MD;  Location: [facility] OR;  Service: Ophthalmology;  Laterality: Left;  . IRRIGATION AND DEBRIDEMENT SEBACEOUS CYST Right 03/2011   "pointer" (12/19/2012)  . LEFT HEART CATHETERIZATION WITH CORONARY ANGIOGRAM N/A 07/06/2011   Procedure: LEFT HEART CATHETERIZATION WITH CORONARY ANGIOGRAM;  Surgeon: Corky Crafts, MD;  Location: [facility] CATH LAB;  Service: Cardiovascular;  Laterality: N/A;  possible PCI   . LEFT HEART CATHETERIZATION WITH CORONARY ANGIOGRAM N/A 12/19/2012   Procedure: LEFT HEART CATHETERIZATION WITH CORONARY ANGIOGRAM;  Surgeon: Corky Crafts, MD;  Location: [facility] CATH LAB;  Service: Cardiovascular;  Laterality: N/A;  . MEMBRANE PEEL Left 07/16/2013   Procedure: MEMBRANE PEEL;  Surgeon: Shade Flood, MD;  Location[facility] OR;  Service: Ophthalmology;  Laterality: Left;  . PARS PLANA VITRECTOMY Left 07/16/2013   Procedure: PARS PLANA VITRECTOMY WITH 23 GAUGE;  Surgeon: Shade Flood, MD;  Location: [facility] OR;  Service: Ophthalmology;  Laterality: Left;  . PARS PLANA VITRECTOMY Left 07/30/2013   Procedure: PARS PLANA VITRECTOMY WITH 23 GAUGE WITH ENDOLASER;  Surgeon: Shade Flood, MD;  Location: [facility] OR;  Service: Ophthalmology;  Laterality: Left;  with endolaser  . PERCUTANEOUS CORONARY STENT INTERVENTION (PCI-S) N/A 07/06/2011   Procedure: PERCUTANEOUS CORONARY STENT INTERVENTION (PCI-S);  Surgeon: Corky Crafts, MD;  Location: [facility] CATH LAB;  Service: Cardiovascular;  Laterality: N/A;  . PHOTOCOAGULATION WITH LASER Left 07/16/2013   Procedure: PHOTOCOAGULATION WITH LASER;  Surgeon: Shade Flood, MD;  Location: [facility] OR;  Service: Ophthalmology;  Laterality: Left;  ENDOLASER       History of present illness and  Hospital Course:     Kindly see H&P for history of present illness and admission details, please review complete Labs, Consult reports and Test reports for all details in brief  HPI  from the history and physical done on the day of admission 02/15/2018  HPI: [name] [name] is a 52 y.o. female with medical history significant of above medical problems including history of MI and CAD that is post DES, bipolar affective disorder, depression, anxiety, diabetes mellitus type 2 complicated by multiple issues including diabetic retinopathy, gastroparesis, renal dysfunction, chronic kidney disease stage III, rheumatoid arthritis on methotrexate, and other comorbidities who presented to  [facility] emergency room as a direct transfer from [facility] for chief complaint of left foot pain, nausea, vomiting and mild lightheadedness dizziness.  Patient states that she went to have a pedicure done last week and subsequently after that she developed foot pain and blistering on her heel.  She pointed her primary care physician who placed her on Keflex and patient states that subsequently after taking Keflex she started having nausea and vomiting.  States that she has not been eating very well and states that she is afraid to eat because of her nausea.  Says she feels hungry now.  This morning she states that she felt some dizziness and  attributed it to her medication specifically her Keflex.  She states however she has been having significant left heel pain has been constant with some blistering.  She states that it is difficult to bear weight on it.  Patient's nausea and vomiting has now resolved and states that she feels hungry but states her foot pain is still there.  In the ED revealed that she has a subtle lucency involving the dorsal aspect of the posterior calcaneal cortex which was concerning for erosion secondary to ostium myelitis.  [facility] was consulted for her left foot pain and she will be admitted to rule out osteomyelitis and she will start empiric antibiotics with IV vancomycin IV cefepime  ED Course: Was given 2.5 L NS, started on IV vancomycin IV Zosyn, and had a basic blood work as well as a foot x-ray done     Hospital Course  52 y.o.femalewith medical history significant ofabove medical problems including history of MI andCADthat is post DES, bipolar affective disorder, depression, anxiety, diabetes mellitus type 2 complicated by multiple issues including diabetic retinopathy, gastroparesis, renal dysfunction, chronic kidney disease stage III, rheumatoid arthritis on methotrexate, he presents to ED with left heel wound after she had a pedicure, x-ray with some  lucency, admitted to rule out osteomyelitis.  MRI was negative for osteomyelitis    Left heel blister/cellulitis -It seems he sustained this blister/wound after she has pedicures done with some scrubbing of her feet, she is afebrile, no leukocytosis, but given she is diabetic and on methotrexate, foot x-ray did show some lucency, she was kept on IV vancomycin and Zosyn during hospital stay until ostium myelitis ruled out, she had MRI of left foot which ruled out osteomyelitis, but confirms cellulitis, no deep abscess or fluid collection to drain, she is afebrile, leukocytosis, she was transitioned to Bactrim and Keflex on discharge for another 5 days.   AKI on CKD 3 -Baseline creatinine 0.9-1.2, it was 1.6 on admission, it is improving, creatinine back to baseline, it is 1.1 today  Nausea and Vomiting -Resolved, no recurrence during hospital stay  CAD/Hx of MI -She denies any chest pain or shortness of breath, continue with Plavix, metoprolol, and statin   Hypokalemia -Repleted  HTN -C/w Home Medications including Metoprolol 25 mg po BID  HLD -C/w Rosuvastatin 40 mg po Daily  Bipolar Disorder/Depression/Anxiety  -C/w Sertraline 100 mg po Daily   GERD/Hx of Ulcers/Hx of Gastritis/Esophagitis  -C/w Pantoprazole 40 mg po BID  Rheumatoid Arthritis -Resume home meds  Diabetes Mellitus with Hx of Diabetic Retinopathy and Gastroparesis  -Recent A1c 7.3, resume home meds -C/w Home Pantoprazole 40 mg po BID and Metoclopramide 5 mg po TID before meals and bedtime  Hx of CVA -C/w Clopidogrel 75 mg po Daily and Rosuvastatin 40 mg po Daily    Discharge Condition:  stable   Follow UP  Follow-up Information    Julieanne Manson, MD Follow up in 1 week(s).   Specialty:  Internal Medicine Contact information: [address] [place] [address] [phone]             Discharge Instructions  and  Discharge Medications    Discharge Instructions     Discharge instructions   Complete by:  As directed    Follow with Primary MD Julieanne Manson, MD in 7 days   Get CBC, CMP,checked  by Primary MD next visit.    Activity: As tolerated with Full fall precautions use walker/cane & assistance as needed   Disposition Home  Diet: Heart Healthy, carbohydrate modified, with feeding assistance and aspiration precautions.  For Heart failure patients - Check your Weight same time everyday, if you gain over 2 pounds, or you develop in leg swelling, experience more shortness of breath or chest pain, call your Primary MD immediately. Follow Cardiac Low Salt Diet and 1.5 lit/day fluid restriction.   On your next visit with your primary care physician please Get Medicines reviewed and adjusted.   Please request your Prim.MD to go over all Hospital Tests and Procedure/Radiological results at the follow up, please get all Hospital records sent to your Prim MD by signing hospital release before you go home.   If you experience worsening of your admission symptoms, develop shortness of breath, life threatening emergency, suicidal or homicidal thoughts you must seek medical attention immediately by calling 911 or calling your MD immediately  if symptoms less severe.  You Must read complete instructions/literature along with all the possible adverse reactions/side effects for all the Medicines you take and that have been prescribed to you. Take any new Medicines after you have completely understood and accpet all the possible adverse reactions/side effects.   Do not drive, operating heavy machinery, perform activities at heights, swimming or participation in water activities or provide baby sitting services if your were admitted for syncope or siezures until you have seen by Primary MD or a Neurologist and advised to do so again.  Do not drive when taking Pain medications.    Do not take more than prescribed Pain, Sleep and Anxiety  Medications  Special Instructions: If you have smoked or chewed Tobacco  in the last 2 yrs please stop smoking, stop any regular Alcohol  and or any Recreational drug use.  Wear Seat belts while driving.   Please note  You were cared for by a hospitalist during your hospital stay. If you have any questions about your discharge medications or the care you received while you were in the hospital after you are discharged, you can call the unit and asked to speak with the hospitalist on call if the hospitalist that took care of you is not available. Once you are discharged, your primary care physician will handle any further medical issues. Please note that NO REFILLS for any discharge medications will be authorized once you are discharged, as it is imperative that you return to your primary care physician (or establish a relationship with a primary care physician if you do not have one) for your aftercare needs so that they can reassess your need for medications and monitor your lab values.   Increase activity slowly   Complete by:  As directed      Allergies as of 02/17/2018      Reactions   Aspirin Other (See Comments)   Stomach bleeds Stomach bleeds GI bleeding Other reaction(s): Other (See Comments) GI bleeding Stomach bleeds      Medication List    STOP taking these medications   Cephalexin 250 MG tablet Replaced by:  cephALEXin 500 MG capsule     TAKE these medications   acetaminophen 500 MG tablet Commonly known as:  TYLENOL Take 500-1,000 mg by mouth every 6 (six) hours as needed for moderate pain.   albuterol 108 (90 Base) MCG/ACT inhaler Commonly known as:  PROVENTIL HFA;VENTOLIN HFA Inhale 2 puffs into the lungs every 6 (six) hours as needed for wheezing or shortness of breath.   cephALEXin 500 MG capsule Commonly known as:  KEFLEX Take 1 capsule (500 mg  total) by mouth 3 (three) times daily. Replaces:  Cephalexin 250 MG tablet   clopidogrel 75 MG tablet Commonly  known as:  PLAVIX Take 1 tablet (75 mg total) by mouth daily.   cyclobenzaprine 10 MG tablet Commonly known as:  FLEXERIL Take 1-2 tablets by mouth at bedtime as needed. Muscle spasms   ferrous sulfate 325 (65 FE) MG tablet Take 1 tablet (325 mg total) by mouth daily with breakfast.   fexofenadine 180 MG tablet Commonly known as:  ALLEGRA Take 1 tablet (180 mg total) by mouth daily.   folic acid 1 MG tablet Commonly known as:  FOLVITE Take 1 mg by mouth daily.   furosemide 40 MG tablet Commonly known as:  LASIX TAKE 1 TABLET BY MOUTH EVERY MORNING   NOVOLOG FLEXPEN 100 UNIT/ML FlexPen Generic drug:  insulin aspart 8 UNITS IF UNDER 300 MG/DL, 10 UNITS IF OVER 725 MG/DL AT BEGINNING OF MEALS SUBCUTANEOUSLY   insulin aspart 100 UNIT/ML injection Commonly known as:  novoLOG Inject 4-16 Units into the skin 3 (three) times daily with meals. Sliding scale CBG 100-150; 4 units, 151-200; 6 units, 201-250; 8 units, 251-300; 10 units, 301-350; 12 units, 351-400; 14 units, > 401; call doctor   LANTUS SOLOSTAR 100 UNIT/ML Solostar Pen Generic drug:  Insulin Glargine 10 UNITS EACH MORNING, 10 UNITS AT BEDTIME TWICE A DAY PER DR. KERR SUBCUTANEOUS What changed:  Another medication with the same name was removed. Continue taking this medication, and follow the directions you see here.   methocarbamol 500 MG tablet Commonly known as:  ROBAXIN Take 1 tablet (500 mg total) by mouth 2 (two) times daily.   methotrexate 2.5 MG tablet Commonly known as:  RHEUMATREX Take 4 tablets (10 mg total) by mouth 2 (two) times a week. Caution:Chemotherapy. Protect from light.   metoCLOPramide 10 MG tablet Commonly known as:  REGLAN Take 0.5 tablets (5 mg total) by mouth 4 (four) times daily -  before meals and at bedtime. What changed:  how much to take   metoprolol tartrate 25 MG tablet Commonly known as:  LOPRESSOR Take 1 tablet (25 mg total) by mouth 2 (two) times daily.   nitroGLYCERIN 0.4 MG  SL tablet Commonly known as:  NITROSTAT Place 1 tablet (0.4 mg total) under the tongue every 5 (five) minutes as needed for chest pain.   nystatin powder Commonly known as:  MYCOSTATIN/NYSTOP Apply to affected area 4 times daily for 7 days or until resolved   ondansetron 4 MG tablet Commonly known as:  ZOFRAN Take 4 mg by mouth as needed for nausea/vomiting.   pantoprazole 40 MG tablet Commonly known as:  PROTONIX Take 1 tablet (40 mg total) by mouth 2 (two) times daily.   POISE ULTRA THINS Pads Use 5 pads daily as needed for urinary stress incontinence   prednisoLONE acetate 1 % ophthalmic suspension Commonly known as:  PRED FORTE Place 4-5 drops into the left eye daily.   rosuvastatin 40 MG tablet Commonly known as:  CRESTOR Take 1 tablet (40 mg total) by mouth daily.   sertraline 100 MG tablet Commonly known as:  ZOLOFT 1 tab by mouth daily   sucralfate 1 g tablet Commonly known as:  CARAFATE Mix 1 tab in 10 cc water and drink 3 times daily before a meal and a fourth before bed   sulfamethoxazole-trimethoprim 800-160 MG tablet Commonly known as:  BACTRIM DS,SEPTRA DS Take 1 tablet by mouth every 12 (twelve) hours.   traMADol 50 MG  tablet Commonly known as:  ULTRAM TAKE 1 TABLET EVERY 6 HOURS AS NEEDED FOR MODERATE PAIN         Diet and Activity recommendation: See Discharge Instructions above   Consults obtained - none   Major procedures and Radiology Reports - PLEASE review detailed and final reports for all details, in brief -     Dg Chest 2 View  Result Date: 02/15/2018 CLINICAL DATA:  Cough EXAM: CHEST - 2 VIEW COMPARISON:  01/04/2018 FINDINGS: Normal heart size, mediastinal contours, and pulmonary vascularity. Lungs clear. No pleural effusion or pneumothorax. Bones unremarkable. IMPRESSION: Normal exam. Electronically Signed   By: Ulyses Southward M.D.   On: 02/15/2018 12:18   Mr Foot Left W Contrast  Result Date: 02/16/2018 CLINICAL DATA:  Hindfoot  swelling.  Large skin blister.  Diabetic. EXAM: MRI OF THE LEFT FOREFOOT WITH CONTRAST TECHNIQUE: Multiplanar, multisequence MR imaging of the left foot was performed following the administration of intravenous contrast. CONTRAST:  37mL MULTIHANCE GADOBENATE DIMEGLUMINE 529 MG/ML IV SOLN COMPARISON:  Radiograph 02/15/2018 FINDINGS: There is a large skin blister noted along the lateral and posterior aspect of the heel also extending down along the plantar surface. Associated diffuse underlying cellulitis but no discrete rim enhancing abscess, myofasciitis or pyomyositis. No findings for septic arthritis or osteomyelitis. The major ligaments and tendons appear intact. IMPRESSION: 1. Large skin blister along the lateral and plantar aspect of the heel but no skin ulceration or evidence of drainable abscess. 2. Cellulitis without findings for myofasciitis, pyomyositis, septic arthritis or osteomyelitis. Electronically Signed   By: Rudie Meyer M.D.   On: 02/16/2018 14:10   Dg Foot Complete Left  Result Date: 02/15/2018 CLINICAL DATA:  Left foot pain. On antibiotics for infection on back of heel of left foot. EXAM: LEFT FOOT - COMPLETE 3+ VIEW COMPARISON:  09/24/2009 FINDINGS: There is mild soft tissue stranding identified within Kager's fat pad just above the calcaneus. A focal area of lucency along the dorsal surface of the calcaneus posteriorly is noted. This appears new when compared with comparison exam. The remainder of the foot appears intact without fracture or dislocation. IMPRESSION: 1. There is a subtle lucency involving the dorsal aspect of the posterior calcaneal cortex. Cannot rule out erosion in this area secondary to osteomyelitis. Clinical correlation with direct site of infection is advised. If further imaging is clinically indicated then a contrast enhanced MRI may be helpful. Electronically Signed   By: Signa Kell M.D.   On: 02/15/2018 12:27    Micro Results    Recent Results (from the  past 240 hour(s))  Blood Culture (routine x 2)     Status: None (Preliminary result)   Collection Time: 02/15/18 11:20 AM  Result Value Ref Range Status   Specimen Description   Final    BLOOD RIGHT ANTECUBITAL Performed at [facility], [address] [place], [place] [address]    Special Requests   Final    BOTTLES DRAWN AEROBIC AND ANAEROBIC Blood Culture adequate volume Performed at [facility], [address] [place], [place] [address]    Culture   Final    NO GROWTH < 24 HOURS Performed at [facility], [address], [place], [place] [address]    Report Status PENDING  Incomplete  Blood Culture (routine x 2)     Status: None (Preliminary result)   Collection Time: 02/15/18 11:35 AM  Result Value Ref Range Status   Specimen Description  BLOOD RIGHT WRIST  Final   Special Requests   Final    BOTTLES DRAWN AEROBIC ONLY Blood Culture results may not be optimal due to an inadequate volume of blood received in culture bottles Performed at [facility], [address] [place], [place] [address]    Culture   Final    NO GROWTH < 24 HOURS Performed at [facility], [address] [place], [place] [address]    Report Status PENDING  Incomplete  Urine culture     Status: None   Collection Time: 02/16/18  9:00 AM  Result Value Ref Range Status   Specimen Description   Final    URINE, RANDOM Performed at [facility], [address], [place], [place] [address]    Special Requests   Final    NONE Performed at [facility], [address] [place], [place] [address]    Culture   Final    NO GROWTH Performed at [facility], [address], [place], [place] [address]    Report Status 02/17/2018 FINAL  Final       Today   Subjective:   Giovanna Drolet today has no headache,no chest or abdominal pain,no new weakness tingling or numbness, feels much better wants to go home today.    Objective:   Blood pressure 139/71, pulse 72, temperature 98 F (36.7 C), temperature source Oral, resp. rate 16, height 5\' 1"  (1.549 m), weight 84.2 kg (185 lb 10 oz), last menstrual period 01/12/2015, SpO2 96 %.   Intake/Output Summary (Last 24 hours) at 02/17/2018 1152 Last data filed at 02/16/2018 1815 Gross per 24 hour  Intake 336.67 ml  Output -  Net 336.67 ml    Exam Awake Alert, Oriented x 3, No new F.N deficits, Normal affect Symmetrical Chest wall movement, Good air movement bilaterally, CTAB RRR,No Gallops,Rubs or new Murmurs, No Parasternal Heave +ve B.Sounds, Abd Soft, Non tender,  No rebound -guarding or rigidity. No Cyanosis, Clubbing or edema, No new Rash or bruise, she is with left heel blister, but no evidence of discharge .  Data Review   CBC w Diff:  Lab Results  Component Value Date   WBC 5.3 02/17/2018   HGB 12.3 02/17/2018   HCT 39.1 02/17/2018   PLT 372 02/17/2018   LYMPHOPCT 21 02/16/2018   MONOPCT 3 02/16/2018   EOSPCT 2 02/16/2018   BASOPCT 0 02/16/2018    CMP:  Lab Results  Component Value Date   NA 139 02/17/2018   NA 142 10/13/2016   K 3.8 02/17/2018   CL 106 02/17/2018   CO2 24 02/17/2018   BUN 14 02/17/2018   BUN 15 10/13/2016   CREATININE 1.19 (H) 02/17/2018   PROT 6.7 02/16/2018   PROT 7.9 10/13/2016   ALBUMIN 2.8 (L) 02/16/2018   ALBUMIN 4.5 10/13/2016   BILITOT 0.4 02/16/2018   BILITOT 0.5 10/13/2016   ALKPHOS 100 02/16/2018   AST 13 (L) 02/16/2018   ALT 7 02/16/2018  .   Total Time in preparing paper work, data evaluation and todays exam - 35 minutes  Huey Bienenstock M.D on 02/17/2018 at 11:52 AM  [facility]   Office  [phone]

## 2018-02-17 NOTE — Progress Notes (Signed)
Discussed with patient discharge instructions she verbalized agreement and understanding. Patient to go home with all belongings in private vehicle. 

## 2018-02-17 NOTE — Progress Notes (Signed)
Nutrition Brief Note  Patient identified on the Malnutrition Screening Tool (MST) Report.  Spoke with pt at bedside who reports that she normally has a good appetite but has been worried about eating over the past week due to nausea that pt believes is from her antibiotics. Pt reports being hungry at time of visit. Noted untouched breakfast meal tray in room. Pt states she is going to eat it soon. Pt ordering lunch meal as RD leaving room.  Pt reports that her weight is usually stable but that he has lost some weight recently and that this has been intentional. Pt reports her UBW as 170-180 lbs.  Wt Readings from Last 15 Encounters:  02/17/18 185 lb 10 oz (84.2 kg)  02/12/18 175 lb (79.4 kg)  01/24/18 177 lb (80.3 kg)  12/07/17 175 lb (79.4 kg)  11/27/17 182 lb 6.4 oz (82.7 kg)  11/21/17 184 lb (83.5 kg)  05/29/17 192 lb 12.8 oz (87.5 kg)  12/01/16 184 lb (83.5 kg)  10/13/16 185 lb 6.4 oz (84.1 kg)  09/12/16 182 lb (82.6 kg)  09/01/16 185 lb (83.9 kg)  08/25/16 185 lb (83.9 kg)  07/28/16 179 lb (81.2 kg)  06/16/16 181 lb (82.1 kg)  02/14/16 184 lb (83.5 kg)    Body mass index is 35.07 kg/m. Patient meets criteria for "Obesity Class II" based on current BMI.   Current diet order is Heart Healthy/Carb Modified, patient is consuming approximately 90-100% of meals at this time. Labs and medications reviewed.   No nutrition interventions warranted at this time. If nutrition issues arise, please consult RD.    Earma Reading, MS, RD, LDN Pager: [phone] Weekend/After Hours: [phone]

## 2018-02-19 ENCOUNTER — Ambulatory Visit: Payer: Medicaid Other | Admitting: Internal Medicine

## 2018-02-19 ENCOUNTER — Encounter: Payer: Self-pay | Admitting: Internal Medicine

## 2018-02-19 ENCOUNTER — Other Ambulatory Visit: Payer: Self-pay

## 2018-02-19 VITALS — BP 102/62 | HR 72 | Resp 12 | Ht 61.0 in | Wt 170.0 lb

## 2018-02-19 DIAGNOSIS — L03119 Cellulitis of unspecified part of limb: Secondary | ICD-10-CM | POA: Diagnosis not present

## 2018-02-19 DIAGNOSIS — R7989 Other specified abnormal findings of blood chemistry: Secondary | ICD-10-CM | POA: Diagnosis not present

## 2018-02-19 DIAGNOSIS — E118 Type 2 diabetes mellitus with unspecified complications: Secondary | ICD-10-CM

## 2018-02-19 LAB — GLUCOSE, POCT (MANUAL RESULT ENTRY): POC Glucose: 245 mg/dl — AB (ref 70–99)

## 2018-02-19 MED ORDER — ONDANSETRON HCL 4 MG PO TABS: 4.0000 mg | ORAL_TABLET | ORAL | 0 refills | Status: DC | PRN

## 2018-02-19 NOTE — Patient Instructions (Signed)
Call if pustular discharge, redness, swelling or increased pain or fever. Soak daily for 20 minutes and make sure water out from under roof of blister, then rewrap. Keep blister covered

## 2018-02-19 NOTE — Progress Notes (Signed)
Subjective:    Patient ID: [name] [name], female    DOB: 12-07-1965, 52 y.o.   MRN: [id number]  HPI   Here for follow up of left heel/foot blister and cellulitis.  Ended up getting hospitalized 3 days after last seen.  Was having Nausea and vomiting when took Cephalexin and need up going to ED 02/15/18.  MRI did not show involvement of bone. Treated with Vanc and Zosyn until bone involvement ruled out, then switched back to Cephalexin and Bactrim  Discharged 2 days ago.  WBCs were never elevated and hemoglobin was fine at discharge. Blood cultures were negative.  Creatinine was up a bit at 1.6 on admission.  Back to baseline of 1.1 at discharge  DM:  Sugars at home running 140-209 max.  She states she is being good.  Current Meds  Medication Sig  . acetaminophen (TYLENOL) 500 MG tablet Take 500-1,000 mg by mouth every 6 (six) hours as needed for moderate pain.   Marland Kitchen albuterol (PROVENTIL HFA;VENTOLIN HFA) 108 (90 Base) MCG/ACT inhaler Inhale 2 puffs into the lungs every 6 (six) hours as needed for wheezing or shortness of breath.  . cephALEXin (KEFLEX) 500 MG capsule Take 1 capsule (500 mg total) by mouth 3 (three) times daily.  . clopidogrel (PLAVIX) 75 MG tablet Take 1 tablet (75 mg total) by mouth daily.  . cyclobenzaprine (FLEXERIL) 10 MG tablet Take 1-2 tablets by mouth at bedtime as needed. Muscle spasms  . ferrous sulfate 325 (65 FE) MG tablet Take 1 tablet (325 mg total) by mouth daily with breakfast.  . folic acid (FOLVITE) 1 MG tablet Take 1 mg by mouth daily.  . furosemide (LASIX) 40 MG tablet TAKE 1 TABLET BY MOUTH EVERY MORNING  . Incontinence Supply Disposable (POISE ULTRA THINS) PADS Use 5 pads daily as needed for urinary stress incontinence  . insulin aspart (NOVOLOG) 100 UNIT/ML injection Inject 4-16 Units into the skin 3 (three) times daily with meals. Sliding scale CBG 100-150; 4 units, 151-200; 6 units, 201-250; 8 units, 251-300; 10 units, 301-350; 12 units, 351-400; 14 units,  > 401; call doctor  . LANTUS SOLOSTAR 100 UNIT/ML Solostar Pen 10 UNITS EACH MORNING, 10 UNITS AT BEDTIME TWICE A DAY PER DR. KERR SUBCUTANEOUS  . methotrexate (RHEUMATREX) 2.5 MG tablet Take 4 tablets (10 mg total) by mouth 2 (two) times a week. Caution:Chemotherapy. Protect from light.  . metoCLOPramide (REGLAN) 10 MG tablet Take 0.5 tablets (5 mg total) by mouth 4 (four) times daily -  before meals and at bedtime. (Patient taking differently: Take 10 mg by mouth 4 (four) times daily -  before meals and at bedtime. )  . metoprolol tartrate (LOPRESSOR) 25 MG tablet Take 1 tablet (25 mg total) by mouth 2 (two) times daily.  . nitroGLYCERIN (NITROSTAT) 0.4 MG SL tablet Place 1 tablet (0.4 mg total) under the tongue every 5 (five) minutes as needed for chest pain.  . pantoprazole (PROTONIX) 40 MG tablet Take 1 tablet (40 mg total) by mouth 2 (two) times daily.  . prednisoLONE acetate (PRED FORTE) 1 % ophthalmic suspension Place 4-5 drops into the left eye daily.  . rosuvastatin (CRESTOR) 40 MG tablet Take 1 tablet (40 mg total) by mouth daily.  . sertraline (ZOLOFT) 100 MG tablet 1 tab by mouth daily  . sucralfate (CARAFATE) 1 g tablet Mix 1 tab in 10 cc water and drink 3 times daily before a meal and a fourth before bed  . sulfamethoxazole-trimethoprim (BACTRIM DS,SEPTRA DS)  800-160 MG tablet Take 1 tablet by mouth every 12 (twelve) hours.  . traMADol (ULTRAM) 50 MG tablet TAKE 1 TABLET EVERY 6 HOURS AS NEEDED FOR MODERATE PAIN    Allergies  Allergen Reactions  . Aspirin Other (See Comments)    Stomach bleeds Stomach bleeds GI bleeding Other reaction(s): Other (See Comments) GI bleeding Stomach bleeds            Review of Systems     Objective:   Physical Exam NAD Left foot:  Blister has drained.  Clear liquid.  Roof of blister intact other than small opening. No surrounding erythema.  Mildly tender over blister area.  No swelling of foot.       Assessment & Plan:    Cellulitis of foot with blister:  To soak in warm soapy water for 20 minutes daily, be certain no water left under blister roof.  Apply 4 x 4 gauze and then Kerlex to protect. Call if pustular discharge/redness/pain/swelling develop.   Complete course of Bactrim and Cephalexin.

## 2018-02-20 ENCOUNTER — Ambulatory Visit: Payer: Medicaid Other | Admitting: Internal Medicine

## 2018-02-20 LAB — CULTURE, BLOOD (ROUTINE X 2)
Culture: NO GROWTH
Culture: NO GROWTH
SPECIAL REQUESTS: ADEQUATE

## 2018-02-26 ENCOUNTER — Other Ambulatory Visit: Payer: Medicaid Other

## 2018-02-27 LAB — LIPID PANEL
CHOL/HDL RATIO: 2.8 ratio (ref 0.0–4.4)
Cholesterol, Total: 145 mg/dL (ref 100–199)
HDL: 51 mg/dL (ref >39)
LDL CALC: 71 mg/dL (ref 0–99)
Triglycerides: 115 mg/dL (ref 0–149)
VLDL Cholesterol Cal: 23 mg/dL (ref 5–40)

## 2018-02-27 LAB — HEPATIC FUNCTION PANEL
ALBUMIN: 4.3 g/dL (ref 3.5–5.5)
ALT: 7 IU/L (ref 0–32)
AST: 11 IU/L (ref 0–40)
Alkaline Phosphatase: 159 IU/L — ABNORMAL HIGH (ref 39–117)
BILIRUBIN TOTAL: 0.3 mg/dL (ref 0.0–1.2)
BILIRUBIN, DIRECT: 0.11 mg/dL (ref 0.00–0.40)
Total Protein: 7.5 g/dL (ref 6.0–8.5)

## 2018-02-27 LAB — COMPREHENSIVE METABOLIC PANEL
ALBUMIN: 4.1 g/dL (ref 3.5–5.5)
ALT: 6 IU/L (ref 0–32)
AST: 17 IU/L (ref 0–40)
Albumin/Globulin Ratio: 1.2 (ref 1.2–2.2)
Alkaline Phosphatase: 165 IU/L — ABNORMAL HIGH (ref 39–117)
BUN / CREAT RATIO: 16 (ref 9–23)
BUN: 17 mg/dL (ref 6–24)
Bilirubin Total: 0.3 mg/dL (ref 0.0–1.2)
CALCIUM: 9.9 mg/dL (ref 8.7–10.2)
CO2: 24 mmol/L (ref 20–29)
CREATININE: 1.09 mg/dL — AB (ref 0.57–1.00)
Chloride: 102 mmol/L (ref 96–106)
GFR calc Af Amer: 68 mL/min/{1.73_m2} (ref >59)
GFR, EST NON AFRICAN AMERICAN: 59 mL/min/{1.73_m2} — AB (ref >59)
GLOBULIN, TOTAL: 3.4 g/dL (ref 1.5–4.5)
Glucose: 177 mg/dL — ABNORMAL HIGH (ref 65–99)
POTASSIUM: 4 mmol/L (ref 3.5–5.2)
SODIUM: 143 mmol/L (ref 134–144)
Total Protein: 7.5 g/dL (ref 6.0–8.5)

## 2018-03-01 ENCOUNTER — Encounter: Payer: Self-pay | Admitting: Internal Medicine

## 2018-03-01 ENCOUNTER — Ambulatory Visit: Payer: Medicaid Other | Admitting: Internal Medicine

## 2018-03-01 VITALS — BP 112/68 | HR 70 | Resp 12 | Ht 61.0 in | Wt 170.0 lb

## 2018-03-01 DIAGNOSIS — L97421 Non-pressure chronic ulcer of left heel and midfoot limited to breakdown of skin: Secondary | ICD-10-CM

## 2018-03-01 DIAGNOSIS — E11621 Type 2 diabetes mellitus with foot ulcer: Secondary | ICD-10-CM

## 2018-03-01 NOTE — Progress Notes (Signed)
Subjective:    Patient ID: [name] [name], female    DOB: 1966/03/27, 52 y.o.   MRN: [id number]  HPI   Here for follow up of large blister on back of left heel.  Finished cephalexin a week ago, about 2 days after last seen. Not soaking and making sure water out from under blister roof every day.  Doing this every other day instead with a dressing change.   She has not been staying off her foot.  States she has been cleaning her home.   Her daughter and boyfriend are helping with dressing changes. Having pain on plantar aspect of large blister area.  Last A1C she believes was between 6.9 and 7.1%.  Current Meds  Medication Sig  . acetaminophen (TYLENOL) 500 MG tablet Take 500-1,000 mg by mouth every 6 (six) hours as needed for moderate pain.   Marland Kitchen albuterol (PROVENTIL HFA;VENTOLIN HFA) 108 (90 Base) MCG/ACT inhaler Inhale 2 puffs into the lungs every 6 (six) hours as needed for wheezing or shortness of breath.  . clopidogrel (PLAVIX) 75 MG tablet Take 1 tablet (75 mg total) by mouth daily.  . cyclobenzaprine (FLEXERIL) 10 MG tablet Take 1-2 tablets by mouth at bedtime as needed. Muscle spasms  . ferrous sulfate 325 (65 FE) MG tablet Take 1 tablet (325 mg total) by mouth daily with breakfast.  . folic acid (FOLVITE) 1 MG tablet Take 1 mg by mouth daily.  . furosemide (LASIX) 40 MG tablet TAKE 1 TABLET BY MOUTH EVERY MORNING  . Incontinence Supply Disposable (POISE ULTRA THINS) PADS Use 5 pads daily as needed for urinary stress incontinence  . insulin aspart (NOVOLOG) 100 UNIT/ML injection Inject 4-16 Units into the skin 3 (three) times daily with meals. Sliding scale CBG 100-150; 4 units, 151-200; 6 units, 201-250; 8 units, 251-300; 10 units, 301-350; 12 units, 351-400; 14 units, > 401; call doctor  . LANTUS SOLOSTAR 100 UNIT/ML Solostar Pen 10 UNITS EACH MORNING, 10 UNITS AT BEDTIME TWICE A DAY PER DR. KERR SUBCUTANEOUS  . methotrexate (RHEUMATREX) 2.5 MG tablet Take 4 tablets (10 mg total) by  mouth 2 (two) times a week. Caution:Chemotherapy. Protect from light.  . metoCLOPramide (REGLAN) 10 MG tablet Take 0.5 tablets (5 mg total) by mouth 4 (four) times daily -  before meals and at bedtime. (Patient taking differently: Take 10 mg by mouth 4 (four) times daily -  before meals and at bedtime. )  . metoprolol tartrate (LOPRESSOR) 25 MG tablet Take 1 tablet (25 mg total) by mouth 2 (two) times daily.  . nitroGLYCERIN (NITROSTAT) 0.4 MG SL tablet Place 1 tablet (0.4 mg total) under the tongue every 5 (five) minutes as needed for chest pain.  Marland Kitchen ondansetron (ZOFRAN) 4 MG tablet Take 1 tablet (4 mg total) by mouth as needed.  . pantoprazole (PROTONIX) 40 MG tablet Take 1 tablet (40 mg total) by mouth 2 (two) times daily.  . prednisoLONE acetate (PRED FORTE) 1 % ophthalmic suspension Place 4-5 drops into the left eye daily.  . rosuvastatin (CRESTOR) 40 MG tablet Take 1 tablet (40 mg total) by mouth daily.  . sertraline (ZOLOFT) 100 MG tablet 1 tab by mouth daily  . sucralfate (CARAFATE) 1 g tablet Mix 1 tab in 10 cc water and drink 3 times daily before a meal and a fourth before bed  . traMADol (ULTRAM) 50 MG tablet TAKE 1 TABLET EVERY 6 HOURS AS NEEDED FOR MODERATE PAIN    Allergies  Allergen Reactions  .  Aspirin Other (See Comments)    Stomach bleeds Stomach bleeds GI bleeding Other reaction(s): Other (See Comments) GI bleeding Stomach bleeds       Review of Systems     Objective:   Physical Exam Thick yellow callus makes up roof of plantar aspect of very large blister that opened and drained serous fluid about 9 days ago.  Tender over the plantar aspect of the blister.  Unable to milk any fluid out of posterior proximal blister where roof opened previously.   To be certain no pustular fluid in lower part of blister where roof is thickest and yellow, sharply debrided roof from posterior superior opening down to plantar aspect.  Thick white proteinaceous covering removed to  granulation tissue at top.   I left the white covering intact over lower posterior area that was exposed with removal of roof as well as that covering the plantar aspect.   No pustular fluid apparent beneath roof and scant clear watery discharge.   Bacitracin applied to open wound with sterile 4 x 4 s and Kerlex.         Assessment & Plan:  Left heel/foot large blister with secondary bacterial infection, the latter still appears resolved. No restart of antibiotics, but very concerned how deep this wound is, though good granulation tissue beneath the white overlay. Feel she needs more regular follow up at the wound center--call and referral made. No more weight bearing.   She is to sit down and elevate foot She could not utilize crutches well here, but has a cane at home and can toe walk with her left foot. She also needs to change the dressing daily. Stated after partial removal of the blister roof, her pain improved.

## 2018-03-01 NOTE — Telephone Encounter (Signed)
Check with Magaline about what home health aide group she would like to come out and help her with housework.  She told me, but I forgot.

## 2018-03-01 NOTE — Telephone Encounter (Signed)
Spoke with patient informed I could not get in touch with Wound Care so she is scheduled to come in for appointment on Monday @ 3 pm. Patient states Home Health is [facility] and Maxine Glenn is the person who is suppose to be working with her. Monica phone # [phone] and [facility] # [phone]. States she just needs a referral so they can come out.

## 2018-03-04 ENCOUNTER — Encounter: Payer: Self-pay | Admitting: Internal Medicine

## 2018-03-04 ENCOUNTER — Ambulatory Visit: Payer: Medicaid Other | Admitting: Internal Medicine

## 2018-03-04 VITALS — BP 122/62 | HR 68 | Resp 12 | Ht 61.0 in | Wt 175.0 lb

## 2018-03-04 DIAGNOSIS — L97421 Non-pressure chronic ulcer of left heel and midfoot limited to breakdown of skin: Secondary | ICD-10-CM | POA: Diagnosis not present

## 2018-03-04 DIAGNOSIS — E11621 Type 2 diabetes mellitus with foot ulcer: Secondary | ICD-10-CM | POA: Diagnosis not present

## 2018-03-04 LAB — GLUCOSE, POCT (MANUAL RESULT ENTRY): POC Glucose: 181 mg/dl — AB (ref 70–99)

## 2018-03-04 MED ORDER — FLUCONAZOLE 150 MG PO TABS: ORAL_TABLET | ORAL | 0 refills | Status: DC

## 2018-03-04 NOTE — Progress Notes (Signed)
   Subjective:    Patient ID: [name] [name], female    DOB: 1966/05/10, 52 y.o.   MRN: [id number]  HPI   Left heel feels much better.  Her daughter, who is changing her dressing daily states it is looking much better. Sugars have been in 135 range fasting in morning.  180s in afternoon  Having significant vaginal itching now as well and would like something for yeast infection.  Was on antibiotics for prolonged period in recent weeks. No discharge or odor.    Review of Systems     Objective:   Physical Exam        Assessment & Plan:

## 2018-03-05 ENCOUNTER — Telehealth: Payer: Self-pay | Admitting: Internal Medicine

## 2018-03-05 ENCOUNTER — Telehealth: Payer: Self-pay | Admitting: Interventional Cardiology

## 2018-03-05 NOTE — Telephone Encounter (Signed)
° °  [facility] Pre-operative Risk Assessment    Request for surgical clearance:  1. What type of surgery is being performed? Colonoscopy   2. When is this surgery scheduled? 04/08/2018  3. What type of clearance is required (medical clearance vs. Pharmacy clearance to hold med vs. Both)? Both   4. Are there any medications that need to be held prior to surgery and how long? Plavix and please advise how long to hold this medication.   5. Practice name and name of physician performing surgery? [facility], Dr. Therisa Doyne   6. What is your office phone number [phone]   7.   What is your office fax number [phone]  8.   Anesthesia type (None, local, MAC, general) ? Not listed    [name] [name] 03/05/2018, 4:31 PM  _________________________________________________________________   (provider comments below)

## 2018-03-05 NOTE — Telephone Encounter (Signed)
Patient called to inform  Dr. Delrae Alfred that she (doctor) will need to call Medicaid in order for her to receive home health services.

## 2018-03-06 ENCOUNTER — Other Ambulatory Visit: Payer: Self-pay | Admitting: Licensed Clinical Social Worker

## 2018-03-06 DIAGNOSIS — F439 Reaction to severe stress, unspecified: Secondary | ICD-10-CM

## 2018-03-06 DIAGNOSIS — Z720 Tobacco use: Secondary | ICD-10-CM
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
## 2018-03-10 ENCOUNTER — Other Ambulatory Visit: Payer: Self-pay | Admitting: Internal Medicine

## 2018-03-10 NOTE — Progress Notes (Signed)
Requesting home health for ADLs, etc.

## 2018-03-11 NOTE — Telephone Encounter (Signed)
OK to hold Plavix for 5 days prior to colonoscopy. No further cardiac testing needed before procedure.

## 2018-03-11 NOTE — Telephone Encounter (Signed)
Routing to Dr. Ulyess Mort, RN, [facility] [address] [place], [place]  [address] [phone]

## 2018-03-11 NOTE — Telephone Encounter (Signed)
Routing to Dr. Eldridge Dace for recommendations on holding Plavix.   Dr. Eldridge Dace - please route your recommendation for how long to hold Plavix if you agree back to the CV DIV PREOP pool.  Rosalio Macadamia, RN, ANP-C [facility] [address] [place], [place]  [address] [phone]

## 2018-03-18 ENCOUNTER — Ambulatory Visit: Payer: Medicaid Other | Admitting: Internal Medicine

## 2018-03-19 ENCOUNTER — Encounter: Payer: Self-pay | Admitting: Internal Medicine

## 2018-03-19 ENCOUNTER — Ambulatory Visit: Payer: Medicaid Other | Admitting: Internal Medicine

## 2018-03-19 VITALS — BP 110/68 | HR 72 | Resp 12 | Ht 61.0 in | Wt 172.0 lb

## 2018-03-19 DIAGNOSIS — E11621 Type 2 diabetes mellitus with foot ulcer: Secondary | ICD-10-CM | POA: Diagnosis not present

## 2018-03-19 DIAGNOSIS — L97421 Non-pressure chronic ulcer of left heel and midfoot limited to breakdown of skin: Secondary | ICD-10-CM | POA: Diagnosis not present

## 2018-03-19 NOTE — Patient Instructions (Addendum)
You have an appointment with Dr. Lajoyce Corners at [facility] at 2:15 p.m. Tomorrow. You do not need to change the dressing before seeing him.  Office address is:  [address] [place], [place] [address] [phone]

## 2018-03-19 NOTE — Progress Notes (Signed)
Subjective:    Patient ID: [name] [name], female    DOB: [birth year], 52 y.o.   MRN: [id number]  HPI   Left foot ulcer:  States doing nicely.  Ulcer has "scabbed up"  Only changing bandaging every other day.  Is not washing wound--keeps covered in shower. Has been bearing weight as well. Sugars running Sotto 100s and low 200s.  No vaginal itching now after Fluconazole.  Finished Cephalexin yesterday.     Current Meds  Medication Sig  . acetaminophen (TYLENOL) 500 MG tablet Take 500-1,000 mg by mouth every 6 (six) hours as needed for moderate pain.   Marland Kitchen albuterol (PROVENTIL HFA;VENTOLIN HFA) 108 (90 Base) MCG/ACT inhaler Inhale 2 puffs into the lungs every 6 (six) hours as needed for wheezing or shortness of breath.  . clopidogrel (PLAVIX) 75 MG tablet Take 1 tablet (75 mg total) by mouth daily.  . cyclobenzaprine (FLEXERIL) 10 MG tablet Take 1-2 tablets by mouth at bedtime as needed. Muscle spasms  . ferrous sulfate 325 (65 FE) MG tablet Take 1 tablet (325 mg total) by mouth daily with breakfast.  . folic acid (FOLVITE) 1 MG tablet Take 1 mg by mouth daily.  . furosemide (LASIX) 40 MG tablet TAKE 1 TABLET BY MOUTH EVERY MORNING  . Incontinence Supply Disposable (POISE ULTRA THINS) PADS Use 5 pads daily as needed for urinary stress incontinence  . insulin aspart (NOVOLOG) 100 UNIT/ML injection Inject 4-16 Units into the skin 3 (three) times daily with meals. Sliding scale CBG 100-150; 4 units, 151-200; 6 units, 201-250; 8 units, 251-300; 10 units, 301-350; 12 units, 351-400; 14 units, > 401; call doctor  . LANTUS SOLOSTAR 100 UNIT/ML Solostar Pen 10 UNITS EACH MORNING, 10 UNITS AT BEDTIME TWICE A DAY PER DR. KERR SUBCUTANEOUS  . methotrexate (RHEUMATREX) 2.5 MG tablet Take 4 tablets (10 mg total) by mouth 2 (two) times a week. Caution:Chemotherapy. Protect from light.  . metoCLOPramide (REGLAN) 10 MG tablet Take 0.5 tablets (5 mg total) by mouth 4 (four) times daily -  before meals and at bedtime.  (Patient taking differently: Take 10 mg by mouth 4 (four) times daily -  before meals and at bedtime. )  . metoprolol tartrate (LOPRESSOR) 25 MG tablet Take 1 tablet (25 mg total) by mouth 2 (two) times daily.  . nitroGLYCERIN (NITROSTAT) 0.4 MG SL tablet Place 1 tablet (0.4 mg total) under the tongue every 5 (five) minutes as needed for chest pain.  Marland Kitchen ondansetron (ZOFRAN) 4 MG tablet Take 1 tablet (4 mg total) by mouth as needed.  . pantoprazole (PROTONIX) 40 MG tablet Take 1 tablet (40 mg total) by mouth 2 (two) times daily.  . prednisoLONE acetate (PRED FORTE) 1 % ophthalmic suspension Place 4-5 drops into the left eye daily.  . rosuvastatin (CRESTOR) 40 MG tablet Take 1 tablet (40 mg total) by mouth daily.  . sertraline (ZOLOFT) 100 MG tablet 1 tab by mouth daily  . traMADol (ULTRAM) 50 MG tablet TAKE 1 TABLET EVERY 6 HOURS AS NEEDED FOR MODERATE PAIN    Allergies  Allergen Reactions  . Aspirin Other (See Comments)    Stomach bleeds Stomach bleeds GI bleeding Other reaction(s): Other (See Comments) GI bleeding Stomach bleeds       Review of Systems     Objective:   Physical Exam  Entire left heel with layers of dead skin, some dried and thickened, some white and soft with good underlying pink tissue with active bleeding on sharp debridement.  No underlying necrotic tissue from what I can tell, but needed to stop with debridement as patient with discomfort.     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 Not clear why patient decided to back off regular cleaning and dressing changes. Does have an appt a week from today with the wound center, but feel this will be too far off and needs more extensive debridement. Able to get patient into Dr Lajoyce Corners. Ortho tomorrow at 2:30 p.m.

## 2018-03-20 ENCOUNTER — Ambulatory Visit (INDEPENDENT_AMBULATORY_CARE_PROVIDER_SITE_OTHER): Payer: Self-pay | Admitting: Orthopedic Surgery

## 2018-03-20 ENCOUNTER — Encounter (HOSPITAL_BASED_OUTPATIENT_CLINIC_OR_DEPARTMENT_OTHER): Payer: Medicaid Other | Attending: Physician Assistant

## 2018-03-20 ENCOUNTER — Telehealth: Payer: Self-pay | Admitting: Internal Medicine

## 2018-03-20 DIAGNOSIS — Z79899 Other long term (current) drug therapy: Secondary | ICD-10-CM | POA: Diagnosis not present

## 2018-03-20 DIAGNOSIS — L97422 Non-pressure chronic ulcer of left heel and midfoot with fat layer exposed: Secondary | ICD-10-CM | POA: Diagnosis not present

## 2018-03-20 DIAGNOSIS — J45909 Unspecified asthma, uncomplicated: Secondary | ICD-10-CM | POA: Insufficient documentation

## 2018-03-20 DIAGNOSIS — I2510 Atherosclerotic heart disease of native coronary artery without angina pectoris: Secondary | ICD-10-CM | POA: Diagnosis not present

## 2018-03-20 DIAGNOSIS — E11621 Type 2 diabetes mellitus with foot ulcer: Secondary | ICD-10-CM | POA: Insufficient documentation

## 2018-03-20 DIAGNOSIS — I252 Old myocardial infarction: Secondary | ICD-10-CM | POA: Insufficient documentation

## 2018-03-20 DIAGNOSIS — L97522 Non-pressure chronic ulcer of other part of left foot with fat layer exposed: Secondary | ICD-10-CM | POA: Diagnosis not present

## 2018-03-20 DIAGNOSIS — I509 Heart failure, unspecified: Secondary | ICD-10-CM | POA: Insufficient documentation

## 2018-03-20 DIAGNOSIS — M0689 Other specified rheumatoid arthritis, multiple sites: Secondary | ICD-10-CM | POA: Diagnosis not present

## 2018-03-20 DIAGNOSIS — Z7902 Long term (current) use of antithrombotics/antiplatelets: Secondary | ICD-10-CM | POA: Insufficient documentation

## 2018-03-20 NOTE — Telephone Encounter (Signed)
Call received from [facility] from [place] at [phone].  He wanted Dr. Delrae Alfred to know that they are seeing patient today at 1:30 p.m.

## 2018-03-20 NOTE — Telephone Encounter (Signed)
Patient had appointment scheduled with general surgeon for today. patient cancelled appointment with general surgery and now will be seen at wound care today @ 1:30 pm

## 2018-03-25 DIAGNOSIS — E11621 Type 2 diabetes mellitus with foot ulcer: Secondary | ICD-10-CM | POA: Diagnosis not present

## 2018-03-26 ENCOUNTER — Encounter (HOSPITAL_BASED_OUTPATIENT_CLINIC_OR_DEPARTMENT_OTHER): Payer: Medicaid Other

## 2018-03-26 ENCOUNTER — Other Ambulatory Visit: Payer: Self-pay | Admitting: Licensed Clinical Social Worker

## 2018-03-26 DIAGNOSIS — F439 Reaction to severe stress, unspecified: Secondary | ICD-10-CM

## 2018-03-27 ENCOUNTER — Telehealth: Payer: Self-pay | Admitting: Internal Medicine

## 2018-03-27 NOTE — Telephone Encounter (Signed)
Patient would like to know if any information has been received regarding home health care via Medicaid.  Patient wants Dr. Delrae Alfred to know that all her other doctors praised the work Dr. Delrae Alfred did on her foot.  Her other doctors stated that Dr. Delrae Alfred did an excellent job.
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
## 2018-04-02 NOTE — Telephone Encounter (Signed)
See next phone note.

## 2018-04-02 NOTE — Telephone Encounter (Signed)
Cherice to call Medicaid again and get approval Routing to Cherice

## 2018-04-03 ENCOUNTER — Encounter (HOSPITAL_BASED_OUTPATIENT_CLINIC_OR_DEPARTMENT_OTHER): Payer: Medicaid Other | Attending: Physician Assistant

## 2018-04-03 DIAGNOSIS — I2510 Atherosclerotic heart disease of native coronary artery without angina pectoris: Secondary | ICD-10-CM | POA: Insufficient documentation

## 2018-04-03 DIAGNOSIS — F17210 Nicotine dependence, cigarettes, uncomplicated: Secondary | ICD-10-CM | POA: Insufficient documentation

## 2018-04-03 DIAGNOSIS — Z7902 Long term (current) use of antithrombotics/antiplatelets: Secondary | ICD-10-CM | POA: Insufficient documentation

## 2018-04-03 DIAGNOSIS — M069 Rheumatoid arthritis, unspecified: Secondary | ICD-10-CM | POA: Insufficient documentation

## 2018-04-03 DIAGNOSIS — Z794 Long term (current) use of insulin: Secondary | ICD-10-CM | POA: Insufficient documentation

## 2018-04-03 DIAGNOSIS — L97422 Non-pressure chronic ulcer of left heel and midfoot with fat layer exposed: Secondary | ICD-10-CM | POA: Insufficient documentation

## 2018-04-03 DIAGNOSIS — L97522 Non-pressure chronic ulcer of other part of left foot with fat layer exposed: Secondary | ICD-10-CM | POA: Diagnosis not present

## 2018-04-03 DIAGNOSIS — E11621 Type 2 diabetes mellitus with foot ulcer: Secondary | ICD-10-CM | POA: Insufficient documentation

## 2018-04-03 DIAGNOSIS — J45909 Unspecified asthma, uncomplicated: Secondary | ICD-10-CM | POA: Insufficient documentation

## 2018-04-03 NOTE — Telephone Encounter (Signed)
Called medicaid and after several numbers given not getting answer. Will call in the morning and speak with another representative to see if we can finish out request. Patient has been informed and verbalized understanding.

## 2018-04-05 NOTE — Telephone Encounter (Signed)
Spoke with medicaid yesterday. Not sure why patient needed a prior auth for home health. Spoke with patient. States she didn't think she needed a authorization for it but she wasn't sure. Will contact person for home health and speak with them

## 2018-04-09 ENCOUNTER — Encounter: Payer: Self-pay | Admitting: Internal Medicine

## 2018-04-09 ENCOUNTER — Ambulatory Visit: Payer: Medicaid Other | Admitting: Internal Medicine

## 2018-04-09 VITALS — BP 138/82 | HR 76 | Resp 12 | Ht 61.0 in | Wt 173.0 lb

## 2018-04-09 DIAGNOSIS — E782 Mixed hyperlipidemia: Secondary | ICD-10-CM

## 2018-04-09 DIAGNOSIS — Z0000 Encounter for general adult medical examination without abnormal findings: Secondary | ICD-10-CM | POA: Diagnosis not present

## 2018-04-09 DIAGNOSIS — E11621 Type 2 diabetes mellitus with foot ulcer: Secondary | ICD-10-CM | POA: Diagnosis not present

## 2018-04-09 DIAGNOSIS — Z716 Tobacco abuse counseling: Secondary | ICD-10-CM | POA: Diagnosis not present

## 2018-04-09 DIAGNOSIS — M059 Rheumatoid arthritis with rheumatoid factor, unspecified: Secondary | ICD-10-CM

## 2018-04-09 DIAGNOSIS — E118 Type 2 diabetes mellitus with unspecified complications: Secondary | ICD-10-CM

## 2018-04-09 DIAGNOSIS — L97421 Non-pressure chronic ulcer of left heel and midfoot limited to breakdown of skin: Secondary | ICD-10-CM

## 2018-04-09 MED ORDER — METHOTREXATE 2.5 MG PO TABS: ORAL_TABLET | ORAL | 1 refills | Status: DC

## 2018-04-09 NOTE — Patient Instructions (Addendum)
Can google "advance directives, [place]"  And bring up form from Secretary of [place]. Print and fill out Or can go to "5 wishes"  Which is also in Spanish and fill out--this costs $5--perhaps easier to use. Designate a Medical Power of Attorney to speak for you if you are unable to speak for yourself when ill or injured  Tobacco Cessation:   [id number] or [phone], the former for support and possibly free nicotine patches/gum and support; the latter for [facility] Smoking cessation class. Get rid of all smoking supplies:  Cigarettes, lighters, ashtrays--no stashes just in case at home if you are serious.  Recommend nicotine patches:  21 mg to skin and change daily for 28 days, then down to 14 mg patches for 14 days, then down to 7 mg patches for 14 days, then stop. Get rid of all cigarettes, lighters, ash trays, smoking paraphernalia before start first patchFree flu vaccine at flu vaccine clinics:    Influenza vaccine clinics:    likely the orange card sign up in September 19 and October 17 for 2 hours each and the [facility] next door to clinic on September 28th, Saturday from 11 a.m to 3 pm.
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
## 2018-04-10 ENCOUNTER — Other Ambulatory Visit: Payer: Self-pay | Admitting: Licensed Clinical Social Worker

## 2018-04-10 DIAGNOSIS — E11621 Type 2 diabetes mellitus with foot ulcer: Secondary | ICD-10-CM | POA: Diagnosis not present

## 2018-04-10 DIAGNOSIS — F439 Reaction to severe stress, unspecified: Secondary | ICD-10-CM

## 2018-04-10 DIAGNOSIS — Z716 Tobacco abuse counseling: Secondary | ICD-10-CM
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
## 2018-04-12 ENCOUNTER — Other Ambulatory Visit: Payer: Self-pay

## 2018-04-12 ENCOUNTER — Encounter (HOSPITAL_COMMUNITY): Payer: Self-pay | Admitting: Emergency Medicine

## 2018-04-12 ENCOUNTER — Emergency Department (HOSPITAL_COMMUNITY): Payer: Medicaid Other

## 2018-04-12 ENCOUNTER — Emergency Department (HOSPITAL_COMMUNITY)
Admission: EM | Admit: 2018-04-12 | Discharge: 2018-04-12 | Disposition: A | Discharge status: Home / Self Care | Payer: Medicaid Other | Attending: Emergency Medicine | Admitting: Emergency Medicine

## 2018-04-12 ENCOUNTER — Encounter (HOSPITAL_COMMUNITY): Payer: Self-pay

## 2018-04-12 DIAGNOSIS — I130 Hypertensive heart and chronic kidney disease with heart failure and stage 1 through stage 4 chronic kidney disease, or unspecified chronic kidney disease: Secondary | ICD-10-CM | POA: Insufficient documentation

## 2018-04-12 DIAGNOSIS — F17210 Nicotine dependence, cigarettes, uncomplicated: Secondary | ICD-10-CM | POA: Insufficient documentation

## 2018-04-12 DIAGNOSIS — Z79899 Other long term (current) drug therapy: Secondary | ICD-10-CM | POA: Insufficient documentation

## 2018-04-12 DIAGNOSIS — I5022 Chronic systolic (congestive) heart failure: Secondary | ICD-10-CM | POA: Insufficient documentation

## 2018-04-12 DIAGNOSIS — I2510 Atherosclerotic heart disease of native coronary artery without angina pectoris: Secondary | ICD-10-CM | POA: Diagnosis not present

## 2018-04-12 DIAGNOSIS — Z794 Long term (current) use of insulin: Secondary | ICD-10-CM | POA: Insufficient documentation

## 2018-04-12 DIAGNOSIS — R109 Unspecified abdominal pain: Secondary | ICD-10-CM | POA: Insufficient documentation

## 2018-04-12 DIAGNOSIS — E119 Type 2 diabetes mellitus without complications: Secondary | ICD-10-CM | POA: Diagnosis not present

## 2018-04-12 DIAGNOSIS — N183 Chronic kidney disease, stage 3 (moderate): Secondary | ICD-10-CM | POA: Insufficient documentation

## 2018-04-12 LAB — CBC
HCT: 39.9 % (ref 36.0–46.0)
Hemoglobin: 12.5 g/dL (ref 12.0–15.0)
MCH: 29.3 pg (ref 26.0–34.0)
MCHC: 31.3 g/dL (ref 30.0–36.0)
MCV: 93.4 fL (ref 78.0–100.0)
PLATELETS: 294 10*3/uL (ref 150–400)
RBC: 4.27 MIL/uL (ref 3.87–5.11)
RDW: 18.6 % — ABNORMAL HIGH (ref 11.5–15.5)
WBC: 10.6 10*3/uL — ABNORMAL HIGH (ref 4.0–10.5)

## 2018-04-12 LAB — BASIC METABOLIC PANEL
Anion gap: 15 (ref 5–15)
BUN: 23 mg/dL — AB (ref 6–20)
CALCIUM: 10 mg/dL (ref 8.9–10.3)
CO2: 20 mmol/L — AB (ref 22–32)
CREATININE: 1.65 mg/dL — AB (ref 0.44–1.00)
Chloride: 103 mmol/L (ref 98–111)
GFR calc Af Amer: 41 mL/min — ABNORMAL LOW (ref >60)
GFR calc non Af Amer: 35 mL/min — ABNORMAL LOW (ref >60)
GLUCOSE: 180 mg/dL — AB (ref 70–99)
Potassium: 4.3 mmol/L (ref 3.5–5.1)
Sodium: 138 mmol/L (ref 135–145)

## 2018-04-12 LAB — I-STAT TROPONIN, ED: TROPONIN I, POC: 0 ng/mL (ref 0.00–0.08)

## 2018-04-12 LAB — I-STAT BETA HCG BLOOD, ED (MC, WL, AP ONLY): I-stat hCG, quantitative: <5 m[IU]/mL (ref <5)
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
## 2018-04-12 NOTE — ED Triage Notes (Signed)
Pt reports R CP radiating R flank starting today. Pt reports cough, SHOB, nausea. Denies vomiting, changes in urination. Pt reports R flank pain started yesterday.

## 2018-04-12 NOTE — ED Triage Notes (Signed)
Pt presents to ED frmo home for R flank pain. Pt reports that the pain started last night. Pt denies fevers, but reports urinary frequency.

## 2018-04-13 ENCOUNTER — Emergency Department (HOSPITAL_COMMUNITY)
Admission: EM | Admit: 2018-04-13 | Discharge: 2018-04-13 | Disposition: A | Discharge status: Home / Self Care | Payer: Medicaid Other | Source: Home / Self Care | Attending: Emergency Medicine | Admitting: Emergency Medicine

## 2018-04-13 ENCOUNTER — Emergency Department (HOSPITAL_COMMUNITY): Payer: Medicaid Other

## 2018-04-13 DIAGNOSIS — R109 Unspecified abdominal pain: Secondary | ICD-10-CM

## 2018-04-13 LAB — URINALYSIS, ROUTINE W REFLEX MICROSCOPIC
Bilirubin Urine: NEGATIVE
GLUCOSE, UA: 50 mg/dL — AB
HGB URINE DIPSTICK: NEGATIVE
KETONES UR: 20 mg/dL — AB
Leukocytes, UA: NEGATIVE
NITRITE: NEGATIVE
PROTEIN: 100 mg/dL — AB
SPECIFIC GRAVITY, URINE: 1.021 (ref 1.005–1.030)
pH: 5 (ref 5.0–8.0)

## 2018-04-13 MED ORDER — ONDANSETRON 4 MG PO TBDP
4.0000 mg | ORAL_TABLET | Freq: Once | ORAL | Status: AC
  Administered 2018-04-13: 4 mg via ORAL
  Filled 2018-04-13: qty 1

## 2018-04-13 MED ORDER — ONDANSETRON HCL 4 MG PO TABS: 4.0000 mg | ORAL_TABLET | Freq: Four times a day (QID) | ORAL | 0 refills | Status: DC

## 2018-04-13 MED ORDER — OXYCODONE-ACETAMINOPHEN 5-325 MG PO TABS
1.0000 | ORAL_TABLET | Freq: Once | ORAL | Status: AC
  Administered 2018-04-13: 1 via ORAL
  Filled 2018-04-13: qty 1

## 2018-04-13 MED ORDER — OXYCODONE-ACETAMINOPHEN 5-325 MG PO TABS: 1.0000 | ORAL_TABLET | Freq: Four times a day (QID) | ORAL | 0 refills | Status: DC | PRN

## 2018-04-13 NOTE — ED Provider Notes (Signed)
[facility]-EMERGENCY DEPT Provider Note   CSN: [id number] Arrival date & time: 04/12/18  2221     History   Chief Complaint Chief Complaint  Patient presents with  . Flank Pain    HPI Elowyn Northcraft is a 52 y.o. female.  Patient presents with right flank pain that has been sharp, intense and intermittent for the past 3 nights. It is now radiating to the RLQ abdomen and has been associated with nausea and vomiting. No fever at any time. She has been urinating frequently but denies hematuria or dysuria. No history of diagnosed kidney stones but she feels she has had them in the past.   The history is provided by the patient. No language interpreter was used.    Past Medical History:  Diagnosis Date  . Acute myocardial infarction of other lateral wall, initial episode of care   . Acute myocardial infarction, unspecified site, initial episode of care   . Acute osteomyelitis   . Allergic rhinitis   . Anemia   . Anginal pain (HCC)    07/15/13- no chest pain in months"  . Anxiety   . Bipolar affective (HCC)   . CAD (coronary artery disease) 12/12   s/p DES mid and distal RCA with 50% LAD  . Daily headache    not daily  . Depression    Bipolar disorder  . Diabetic retinopathy   . Esophageal stenosis   . Esophageal ulcer   . Esophagitis   . Gastroparesis   . Genital herpes    Reportedly tested and documented by [facility]--rare occurrences  . GERD (gastroesophageal reflux disease)   . Heart murmur   . History of stomach ulcers   . Hyperlipidemia   . Hypertension   . 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 Migraines   . Orthopnoea   . Pneumonia 2012  . Polysubstance abuse (HCC)    Crack cocaine--none since 2008, MJ, ETOH:  clean of all since 2008  . Renal insufficiency   . Rheumatoid arthritis(714.0)   . Sebaceous cyst   . Stroke [facility]) 2011   denies residual on 12/19/2012.  "Years ago"  .  Type II diabetes mellitus (HCC)    Previously uncontrolled for many years with multiple complications.  2017 controlled. 05/2016:  6.7%    Patient Active Problem List   Diagnosis Date Noted  . Acute osteomyelitis of left foot (HCC) 02/15/2018  . Foot pain 02/15/2018  . Nausea and vomiting 02/15/2018  . Hypokalemia 02/15/2018  . Urinary, incontinence, stress female 10/14/2015  . Obesity (BMI 30-39.9) 06/21/2015  . Acute renal failure syndrome (HCC)   . DKA (diabetic ketoacidoses) (HCC) 02/21/2015  . Acute renal failure (HCC) 02/21/2015  . Acute UTI 02/21/2015  . CKD (chronic kidney disease), stage III (HCC) 02/21/2015  . Chronic systolic congestive heart failure, NYHA class 1 (HCC) 02/21/2015  . History of NSTEMI w/ DES to cfx May 2014 10/02/2014  . 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 Hyperlipidemia   . Tobacco abuse 10/03/2013  . Diabetic gastroparesis (HCC) 01/16/2013  . Nausea vomiting and diarrhea 12/29/2012  . Coronary atherosclerosis 08/25/2010  . Dehydration 06/17/2010  . Anemia 03/21/2010  . ALLERGIC RHINITIS 12/28/2009  . HTN (hypertension) 10/08/2009  . Rheumatoid arthritis (HCC) 02/18/2009  . Diabetic retinopathy (HCC) 08/13/2007  . ACUTE OSTEOMYELITIS, ANKLE AND FOOT 06/25/2007  . SUBSTANCE ABUSE, MULTIPLE 03/27/2007  . GERD 03/27/2007  . RENAL INSUFFICIENCY,  CHRONIC 03/27/2007  . Anxiety state 03/25/2007  . DEPRESSION 03/25/2007  . ULCER, ESOPHAGUS WITHOUT BLEEDING 07/06/2006  . ESOPHAGEAL STENOSIS 07/06/2006  . Diabetes mellitus with multiple complications (HCC) 06/21/2006    Past Surgical History:  Procedure Laterality Date  . CORONARY ANGIOPLASTY WITH STENT PLACEMENT  01/20/2009   "2" (12/19/2012)  . CORONARY ANGIOPLASTY WITH STENT PLACEMENT  2012   "2" (12/19/2012)  . CORONARY ANGIOPLASTY WITH STENT PLACEMENT  12/19/2012   "2" (12/19/2012)  . ESOPHAGOGASTRODUODENOSCOPY N/A 02/26/2015   Procedure: ESOPHAGOGASTRODUODENOSCOPY (EGD);  Surgeon: Dorena Cookey, MD;   Location[facility] ENDOSCOPY;  Service: Endoscopy;  Laterality: N/A;  . EYE SURGERY     Multiple surgeries of both eyes:  last laser was 09/08/2015 of right eye.  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 GAS INSERTION Left 07/16/2013   Procedure: INSERTION OF GAS;  Surgeon: Shade Flood, MD;  Location: [facility] OR;  Service: Ophthalmology;  Laterality: Left;  SF6  . GAS/FLUID EXCHANGE Left 07/30/2013   Procedure: GAS/FLUID EXCHANGE;  Surgeon: Shade Flood, MD;  Location: [facility] OR;  Service: Ophthalmology;  Laterality: Left;  . IRRIGATION AND DEBRIDEMENT SEBACEOUS CYST Right 03/2011   "pointer" (12/19/2012)  . LEFT HEART CATHETERIZATION WITH CORONARY ANGIOGRAM N/A 07/06/2011   Procedure: LEFT HEART CATHETERIZATION WITH CORONARY ANGIOGRAM;  Surgeon: Corky Crafts, MD;  Location: [facility] CATH LAB;  Service: Cardiovascular;  Laterality: N/A;  possible PCI  . LEFT HEART CATHETERIZATION WITH CORONARY ANGIOGRAM N/A 12/19/2012   Procedure: LEFT HEART CATHETERIZATION WITH CORONARY ANGIOGRAM;  Surgeon: Corky Crafts, MD;  Location: [facility] CATH LAB;  Service: Cardiovascular;  Laterality: N/A;  . MEMBRANE PEEL Left 07/16/2013   Procedure: MEMBRANE PEEL;  Surgeon: Shade Flood, MD;  Location: [facility] OR;  Service: Ophthalmology;  Laterality: Left;  . PARS PLANA VITRECTOMY Left 07/16/2013   Procedure: PARS PLANA VITRECTOMY WITH 23 GAUGE;  Surgeon: Shade Flood, MD;  Location: [facility] OR;  Service: Ophthalmology;  Laterality: Left;  . PARS PLANA VITRECTOMY Left 07/30/2013   Procedure: PARS PLANA VITRECTOMY WITH 23 GAUGE WITH ENDOLASER;  Surgeon: Shade Flood, MD;  Location: [facility] OR;  Service: Ophthalmology;  Laterality: Left;  with endolaser  . PERCUTANEOUS CORONARY STENT INTERVENTION (PCI-S) N/A 07/06/2011   Procedure: PERCUTANEOUS CORONARY STENT INTERVENTION (PCI-S);  Surgeon: Corky Crafts, MD;  Location: [facility] CATH LAB;  Service: Cardiovascular;  Laterality: N/A;  . PHOTOCOAGULATION WITH LASER Left 07/16/2013     Procedure: PHOTOCOAGULATION WITH LASER;  Surgeon: Shade Flood, MD;  Location: [facility] OR;  Service: Ophthalmology;  Laterality: Left;  ENDOLASER     OB History   None      Home Medications    Prior to Admission medications   Medication Sig Start Date End Date Taking? Authorizing Provider  acetaminophen (TYLENOL) 500 MG tablet Take 500-1,000 mg by mouth every 6 (six) hours as needed for moderate pain.     [provider]  albuterol (PROVENTIL HFA;VENTOLIN HFA) 108 (90 Base) MCG/ACT inhaler Inhale 2 puffs into the lungs every 6 (six) hours as needed for wheezing or shortness of breath. 12/01/16   Julieanne Manson, MD  clopidogrel (PLAVIX) 75 MG tablet Take 1 tablet (75 mg total) by mouth daily. 11/27/17   Corky Crafts, MD  cyclobenzaprine (FLEXERIL) 10 MG tablet Take 1-2 tablets by mouth at bedtime as needed. Muscle spasms 09/21/16   [provider]  ferrous sulfate 325 (65 FE) MG tablet Take 1 tablet (325 mg total) by mouth daily with breakfast.  01/25/16   Julieanne Manson, MD  folic acid (FOLVITE) 1 MG tablet Take 1 mg by mouth daily.    [provider]  furosemide (LASIX) 40 MG tablet TAKE 1 TABLET BY MOUTH EVERY MORNING 07/16/17   Julieanne Manson, MD  Incontinence Supply Disposable (POISE ULTRA THINS) PADS Use 5 pads daily as needed for urinary stress incontinence 01/03/16   Julieanne Manson, MD  insulin aspart (NOVOLOG) 100 UNIT/ML injection Inject 4-16 Units into the skin 3 (three) times daily with meals. Sliding scale CBG 100-150; 4 units, 151-200; 6 units, 201-250; 8 units, 251-300; 10 units, 301-350; 12 units, 351-400; 14 units, > 401; call doctor    [provider]  LANTUS SOLOSTAR 100 UNIT/ML Solostar Pen 10 UNITS EACH MORNING, 10 UNITS AT BEDTIME TWICE A DAY PER DR. KERR SUBCUTANEOUS 01/15/18   [provider]  methotrexate (RHEUMATREX) 2.5 MG tablet 3 tabs by mouth twice weekly 04/09/18   Julieanne Manson, MD   metoCLOPramide (REGLAN) 10 MG tablet Take 0.5 tablets (5 mg total) by mouth 4 (four) times daily -  before meals and at bedtime. Patient taking differently: Take 10 mg by mouth 4 (four) times daily -  before meals and at bedtime.  08/03/15   Julieanne Manson, MD  metoprolol tartrate (LOPRESSOR) 25 MG tablet Take 1 tablet (25 mg total) by mouth 2 (two) times daily. 08/29/17   Julieanne Manson, MD  nitroGLYCERIN (NITROSTAT) 0.4 MG SL tablet Place 1 tablet (0.4 mg total) under the tongue every 5 (five) minutes as needed for chest pain. 05/29/17   Corky Crafts, MD  ondansetron (ZOFRAN) 4 MG tablet Take 1 tablet (4 mg total) by mouth as needed. 02/19/18   Julieanne Manson, MD  pantoprazole (PROTONIX) 40 MG tablet Take 1 tablet (40 mg total) by mouth 2 (two) times daily. 08/29/17   Julieanne Manson, MD  prednisoLONE acetate (PRED FORTE) 1 % ophthalmic suspension Place 4-5 drops into the left eye daily. 04/22/17   [provider]  rosuvastatin (CRESTOR) 40 MG tablet Take 1 tablet (40 mg total) by mouth daily. 11/27/17   Corky Crafts, MD  sertraline (ZOLOFT) 100 MG tablet 1 tab by mouth daily 02/14/16   Julieanne Manson, MD  traMADol (ULTRAM) 50 MG tablet TAKE 1 TABLET EVERY 6 HOURS AS NEEDED FOR MODERATE PAIN 03/06/17   Julieanne Manson, MD    Family History Family History  Problem Relation Age of Onset  . Breast cancer Mother 8  . Hypertension Father   . Stomach cancer Sister 60       cause of death  . Hypertension Sister   . Lung cancer Sister 5       Cause of death  . Cancer Sister 87       ? stomach cancer  . Colon cancer Neg Hx     Social History Social History   Tobacco Use  . Smoking status: Current Every Day Smoker    Packs/day: 0.33    Years: 22.00    Pack years: 7.26    Types: Cigarettes  . Smokeless tobacco: Never Used  . Tobacco comment: Has been to classes--not sure if she wants to quit.  Changing to non menthol..  Chantix, patches, gum  never helped.  9/19:  lot going on.  Substance Use Topics  . Alcohol use: Yes    Alcohol/week: 0.0 standard drinks    Comment: socially--holidays  . Drug use: No    Types: "Crack" cocaine, Marijuana    Comment: 12/31/2012 "  clean from crack and marijuana since 2008"     Allergies   Aspirin   Review of Systems Review of Systems  Constitutional: Negative for chills and fever.  Respiratory: Negative.   Cardiovascular: Negative.   Gastrointestinal: Positive for nausea and vomiting.  Genitourinary: Positive for flank pain and frequency. Negative for dysuria and hematuria.  Skin: Negative.   Neurological: Negative.      Physical Exam Updated Vital Signs BP (!) 125/54 (BP Location: Right Arm)   Pulse 98   Temp 97.6 F (36.4 C) (Oral)   Resp 18   Ht 5\' 1"  (1.549 m)   Wt 77.1 kg   LMP 01/12/2015   SpO2 98%   BMI 32.12 kg/m   Physical Exam  Constitutional: She is oriented to person, place, and time. She appears well-developed and well-nourished.  Neck: Normal range of motion.  Pulmonary/Chest: Effort normal. No respiratory distress.  Abdominal: Soft. There is no tenderness.  Genitourinary:  Genitourinary Comments: Mild right CVA tenderness.   Neurological: She is alert and oriented to person, place, and time.  Skin: Skin is warm and dry.     ED Treatments / Results  Labs (all labs ordered are listed, but only abnormal results are displayed) Labs Reviewed  URINALYSIS, ROUTINE W REFLEX MICROSCOPIC - Abnormal; Notable for the following components:      Result Value   Glucose, UA 50 (*)    Ketones, ur 20 (*)    Protein, ur 100 (*)    Bacteria, UA RARE (*)    All other components within normal limits    EKG EKG Interpretation  Date/Time:  Friday April 12 2018 23:01:34 EDT Ventricular Rate:  88 PR Interval:    QRS Duration: 99 QT Interval:  389 QTC Calculation: 471 R Axis:   81 Text Interpretation:  Sinus rhythm Confirmed by Nicanor Alcon, April ([id number]) on  04/13/2018 1:00:01 AM   Radiology Dg Chest 2 View  Result Date: 04/12/2018 CLINICAL DATA:  Chest pain EXAM: CHEST - 2 VIEW COMPARISON:  02/15/2018 FINDINGS: The heart is upper normal in size. Normal vascularity. Lungs are under aerated and clear. Right coronary artery stents are in place. IMPRESSION: No active cardiopulmonary disease. Electronically Signed   By: Jolaine Click M.D.   On: 04/12/2018 21:40    Procedures Procedures (including critical care time)  Medications Ordered in ED Medications - No data to display   Initial Impression / Assessment and Plan / ED Course  I have reviewed the triage vital signs and the nursing notes.  Pertinent labs & imaging results that were available during my care of the patient were reviewed by me and considered in my medical decision making (see chart for details).     Patient here with right flank pain to RLQ pain, N, V without fever for the past 3 nights. No history of diagnosed kidney stones.  She is comfortable at present. Will obtain CT renal study to r/o stones.  CT negative for abnormality, specifically no kidney or ureteral stones. It does show constipation as well as uterine fibroids, either of which could be the source of pain.   Percocet provided here with Zofran. She is comfortable with discharge home - husband here to drive her. Encouraged PCP follow up for recheck.   Final Clinical Impressions(s) / ED Diagnoses   Final diagnoses:  None   1. Right flank pain  ED Discharge Orders    None       Elpidio Anis, [place] 04/15/18 [id number]  Lorre Nick, MD 04/18/18 1228

## 2018-04-13 NOTE — ED Notes (Signed)
Used steady to take patient to the bathroom and back to room. Patient given a female napkin per pateints request.

## 2018-04-13 NOTE — Discharge Instructions (Signed)
Take medications as prescribed for right flank pain and nausea. Follow up with the urologist for further outpatient evaluation as the exact cause of your pain has not been clearly identified. Return to the ED with any severe pain, uncontrolled vomiting or new concern. Please follow up with Dr. Delrae Alfred for recheck of your kidney function levels as well as they are slightly elevated above your normal today.

## 2018-04-19 ENCOUNTER — Encounter: Payer: Self-pay | Admitting: Internal Medicine

## 2018-04-19 ENCOUNTER — Ambulatory Visit: Payer: Medicaid Other | Admitting: Internal Medicine

## 2018-04-19 VITALS — BP 112/68 | HR 74 | Resp 12 | Ht 61.0 in | Wt 169.0 lb

## 2018-04-19 DIAGNOSIS — M62830 Muscle spasm of back: Secondary | ICD-10-CM | POA: Diagnosis not present
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
## 2018-04-24 ENCOUNTER — Other Ambulatory Visit: Payer: Self-pay | Admitting: Licensed Clinical Social Worker

## 2018-04-24 DIAGNOSIS — F439 Reaction to severe stress, unspecified: Secondary | ICD-10-CM

## 2018-04-24 DIAGNOSIS — E11621 Type 2 diabetes mellitus with foot ulcer: Secondary | ICD-10-CM | POA: Diagnosis not present

## 2018-04-24 DIAGNOSIS — Z716 Tobacco abuse counseling: Secondary | ICD-10-CM
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
## 2018-05-07 ENCOUNTER — Encounter (HOSPITAL_BASED_OUTPATIENT_CLINIC_OR_DEPARTMENT_OTHER): Payer: Medicaid Other | Attending: Internal Medicine

## 2018-05-07 DIAGNOSIS — L97522 Non-pressure chronic ulcer of other part of left foot with fat layer exposed: Secondary | ICD-10-CM | POA: Insufficient documentation

## 2018-05-07 DIAGNOSIS — I2510 Atherosclerotic heart disease of native coronary artery without angina pectoris: Secondary | ICD-10-CM | POA: Diagnosis not present

## 2018-05-07 DIAGNOSIS — E11621 Type 2 diabetes mellitus with foot ulcer: Secondary | ICD-10-CM | POA: Diagnosis present

## 2018-05-09 ENCOUNTER — Other Ambulatory Visit: Payer: Self-pay | Admitting: Licensed Clinical Social Worker

## 2018-05-09 DIAGNOSIS — F439 Reaction to severe stress, unspecified: Secondary | ICD-10-CM
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
## 2018-05-21 DIAGNOSIS — E11621 Type 2 diabetes mellitus with foot ulcer: Secondary | ICD-10-CM | POA: Diagnosis not present

## 2018-05-22 ENCOUNTER — Other Ambulatory Visit: Payer: Self-pay | Admitting: Licensed Clinical Social Worker

## 2018-05-22 DIAGNOSIS — F439 Reaction to severe stress, unspecified: Secondary | ICD-10-CM

## 2018-05-22 DIAGNOSIS — Z720 Tobacco use: Secondary | ICD-10-CM

## 2018-05-22 NOTE — Progress Notes (Signed)
   THERAPY PROGRESS NOTE  Session Time:  Participation Level: Active  Behavioral Response: CasualAlertEuthymic  Type of Therapy: Individual Therapy  Treatment Goals addressed: Coping  Interventions: Strength-based and Supportive  Summary: [name] [name] is a 52 y.o. female who presents with a euthymic mood and appropriate affect. She reported that she was feeling very sad and upset that LCSW is leaving. She tearfully expressed that she hates changes and finds it difficult to adapt to changes. She stated that she would be open to meeting a social work Tax inspector and considering ongoing counseling. [name] shared that her relationship with her boyfriend is going well except that she is starting to think of him more as a roommate than a partner. She stated that she does not feel attracted to him at all. [name] shared that she is still smoking because she has felt nervous to use her nicotine patches due to having an allergic reaction years ago when she tried. She committed to trying again, as she does want to quit smoking. [name] shared that she has learned a lot and grown as a person in counseling.    Suicidal/Homicidal: Nowithout intent/plan  Therapist Response: LCSW utilized supportive counseling techniques throughout the session in order to validate emotions and encourage open expression of emotion. LCSW shared that LCSW will be leaving the clinic and gave various options for ongoing counseling. LCSW provided affirmations to [name] for the hard work that she has done to make healthier choices and prioritize her wellbeing.  Plan: [name] [name] agreed to meet with social work intern [name] [name] to decide if she will continue counseling.    [name], LCSW 05/22/2018

## 2018-05-27 ENCOUNTER — Encounter: Payer: Self-pay | Admitting: Internal Medicine

## 2018-05-27 DIAGNOSIS — H5440 Blindness, one eye, unspecified eye: Secondary | ICD-10-CM | POA: Insufficient documentation

## 2018-05-27 DIAGNOSIS — K3184 Gastroparesis: Secondary | ICD-10-CM | POA: Insufficient documentation

## 2018-05-27 DIAGNOSIS — E1042 Type 1 diabetes mellitus with diabetic polyneuropathy: Secondary | ICD-10-CM | POA: Insufficient documentation

## 2018-05-27 DIAGNOSIS — E1142 Type 2 diabetes mellitus with diabetic polyneuropathy: Secondary | ICD-10-CM | POA: Insufficient documentation

## 2018-05-27 DIAGNOSIS — E1143 Type 2 diabetes mellitus with diabetic autonomic (poly)neuropathy: Secondary | ICD-10-CM | POA: Insufficient documentation

## 2018-05-31 IMAGING — MG DIGITAL SCREENING BILATERAL MAMMOGRAM WITH TOMO AND CAD
8 series · 8 of 24 positions shown · non-contrast
Comparison: Previous exam(s).

CLINICAL DATA: Screening.

EXAM:
DIGITAL SCREENING BILATERAL MAMMOGRAM WITH TOMO AND CAD

[R CC synth-2D]
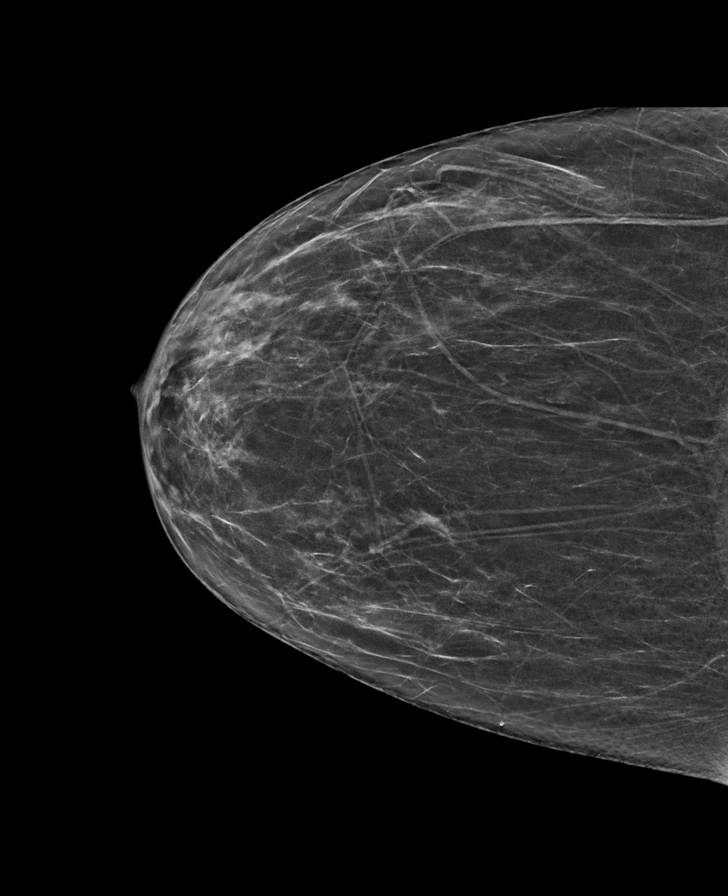

[L MLO synth-2D]
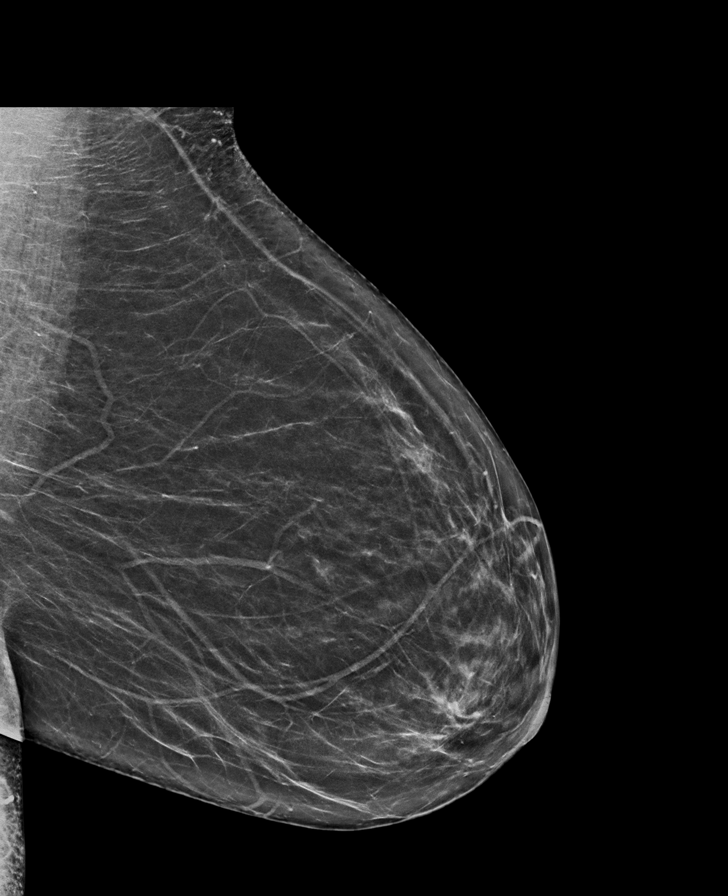

[R MLO synth-2D]
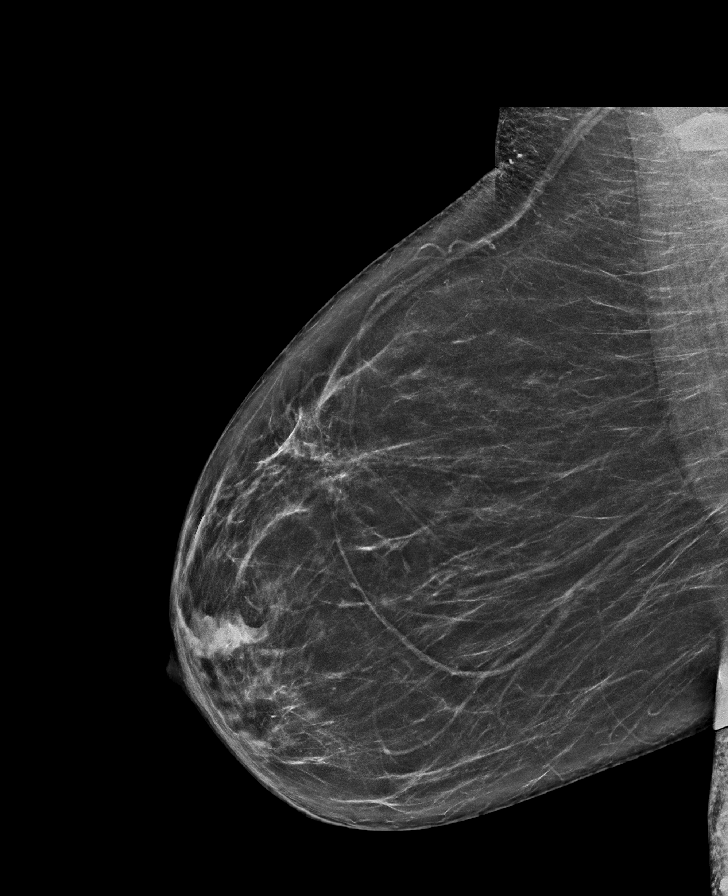

[L CC synth-2D]
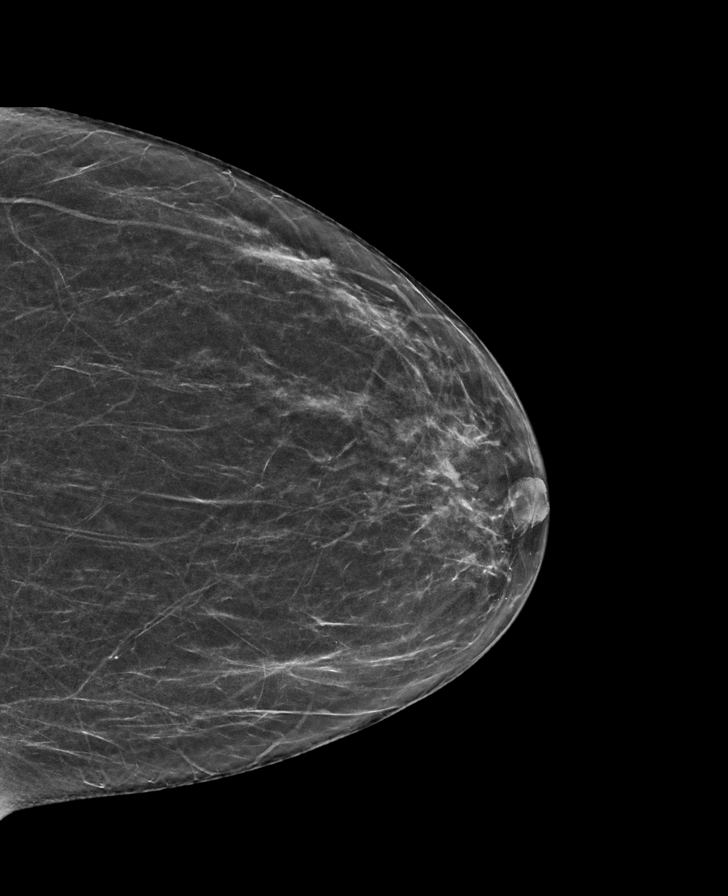

[R CC tomo · tomo slice 31/62.0]
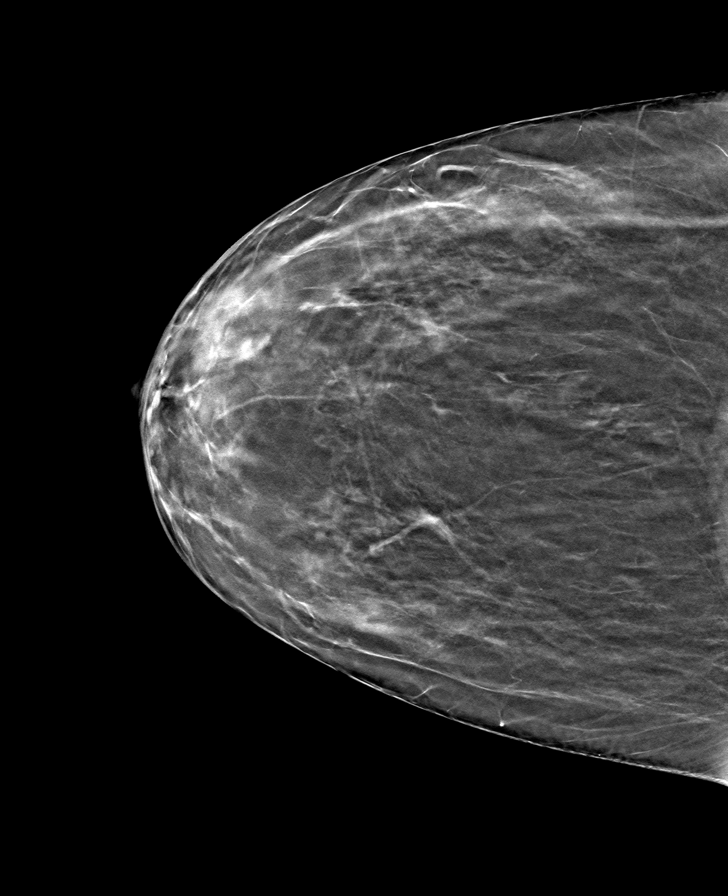

[L CC tomo · tomo slice 31/61.0]
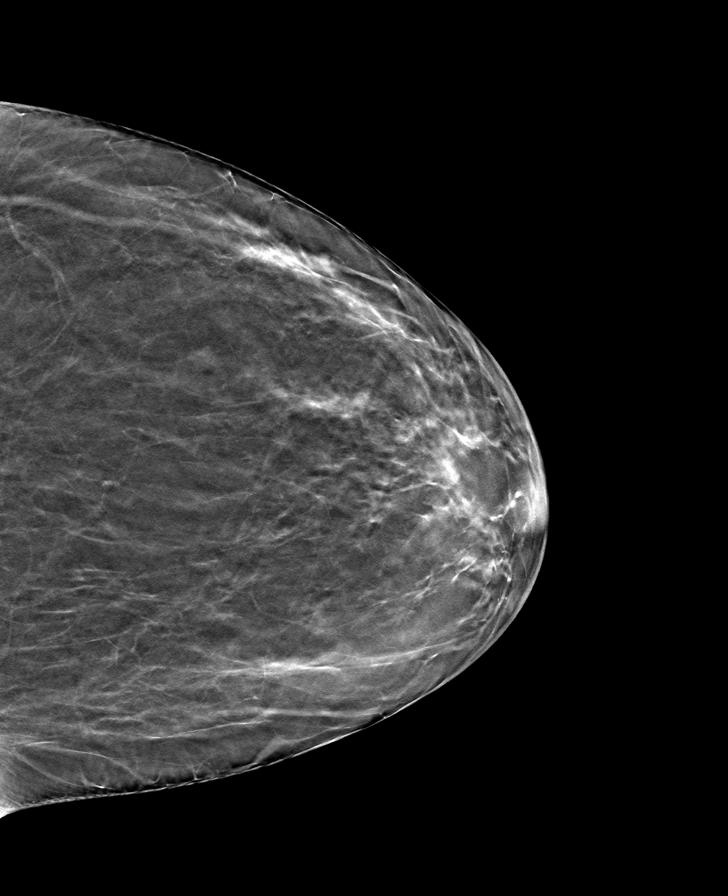

[L MLO tomo · tomo slice 35/68.0]
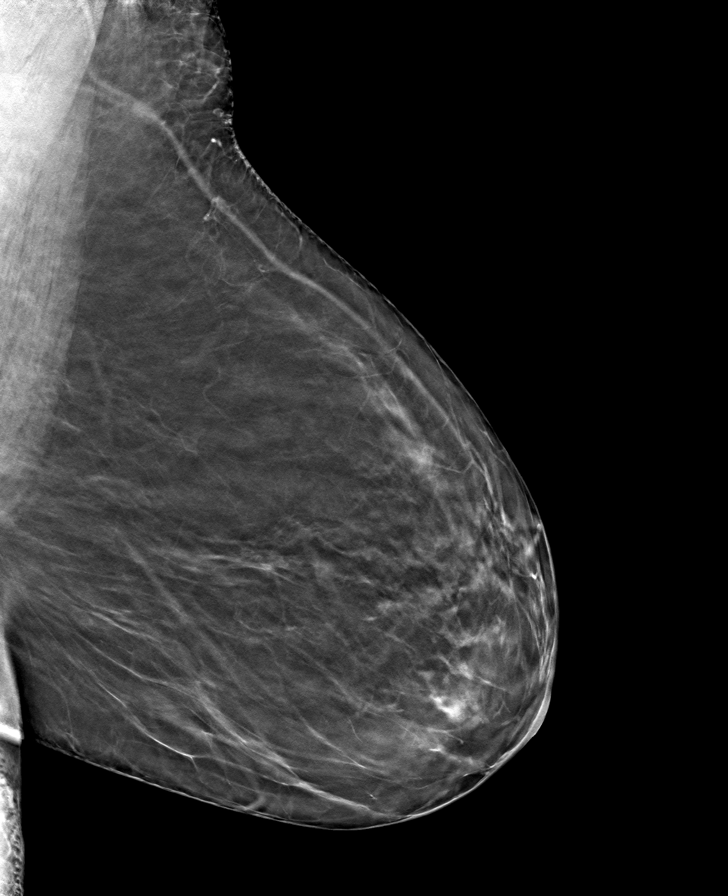

[R MLO tomo · tomo slice 36/71.0]
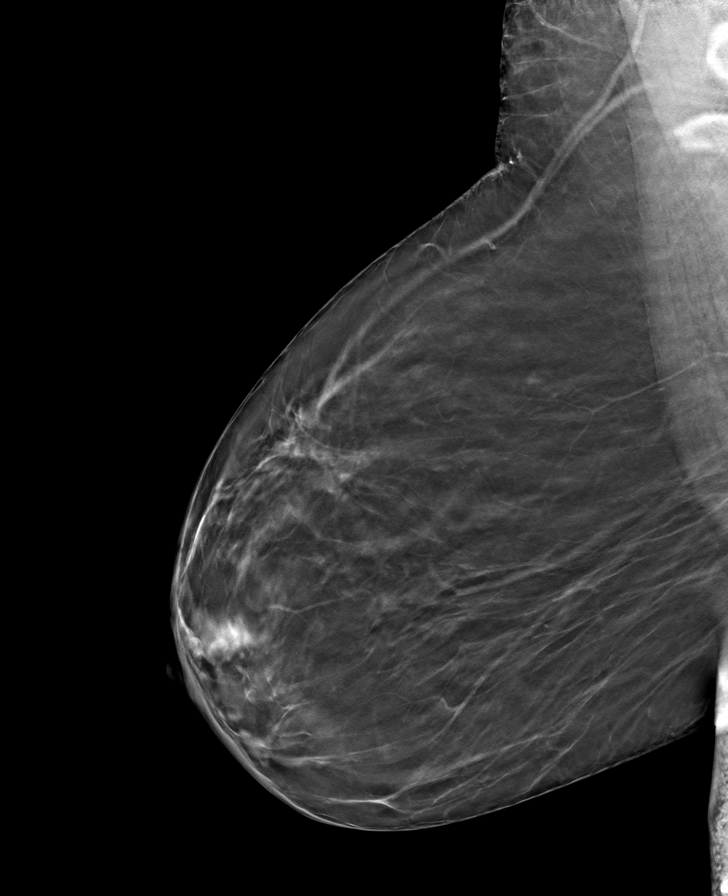

[8 of 24 positions shown; findings below may reference images not displayed]

ACR Breast Density Category b: There are scattered areas of
fibroglandular density.
FINDINGS: There are no findings suspicious for malignancy. Images were
processed with CAD.
IMPRESSION: No mammographic evidence of malignancy. A result letter of this
screening mammogram will be mailed directly to the patient.

RECOMMENDATION:
Screening mammogram in one year. (Code:[id number])

BI-RADS CATEGORY  1: Negative.

## 2018-06-04 ENCOUNTER — Ambulatory Visit: Payer: Self-pay | Admitting: Internal Medicine

## 2018-06-04 ENCOUNTER — Encounter (HOSPITAL_BASED_OUTPATIENT_CLINIC_OR_DEPARTMENT_OTHER): Payer: Medicaid Other | Attending: Internal Medicine

## 2018-06-04 DIAGNOSIS — L97522 Non-pressure chronic ulcer of other part of left foot with fat layer exposed: Secondary | ICD-10-CM | POA: Diagnosis not present

## 2018-06-04 DIAGNOSIS — I2510 Atherosclerotic heart disease of native coronary artery without angina pectoris: Secondary | ICD-10-CM | POA: Insufficient documentation

## 2018-06-04 DIAGNOSIS — E10621 Type 1 diabetes mellitus with foot ulcer: Secondary | ICD-10-CM | POA: Diagnosis present

## 2018-06-06 ENCOUNTER — Ambulatory Visit: Payer: Self-pay | Admitting: Internal Medicine

## 2018-06-06 DIAGNOSIS — F439 Reaction to severe stress, unspecified: Secondary | ICD-10-CM
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
## 2018-06-07 NOTE — Progress Notes (Signed)
   THERAPY PROGRESS NOTE  Session Time:60 min  Participation Level: Active  Behavioral Response: CasualAlertEuthymic  Type of Therapy: Individual Therapy  Treatment Goals addressed: Coping  Interventions: Strength-based  Summary: [name] [name] is a 52 y.o. female who presents with a euthymic mood and appropriate affect. [name] [name] complains of fatigue and stress. [name] [name] disclosed that she is still having trust issues with her boyfriend who lives with her due to his infidelity in the past and his substance use. She recounted the history of her addiction to crack for 11 years, how she stopped using, and her life in recovery. She expressed her frustration with her boyfriend because of his choice to use substances while living with her. [name] [name] also talked about her struggle with race identity and how confusing it was for her growing up biracial. [name] [name] spoke about her daughter, grandchildren, and her pets and how her love for them has given her the strength to make better choices in her life.    Suicidal/Homicidal: Nowithout intent/plan  Therapist Response: Social Work Intern (SWI) used empathic reflection and validation while listening to [name] [name] recount challenging parts of her life. SWI affirmed [name] [name]'s strengths, especially her ability to stop using crack and nurture her family and pets. By asking open-ended questions SWI encouraged [name] [name] to open up about her past and present challenges. SWI concentrated on building rapport and strengthening the therapeutic alliance as this was the first visit with the client.      Plan: Return again in 2 weeks.   Prentiss Bells, Student-Social Work 06/07/2018

## 2018-06-10 ENCOUNTER — Other Ambulatory Visit: Payer: Self-pay

## 2018-06-10 MED ORDER — SERTRALINE HCL 100 MG PO TABS: ORAL_TABLET | ORAL | 11 refills | Status: DC

## 2018-06-11 DIAGNOSIS — E10621 Type 1 diabetes mellitus with foot ulcer: Secondary | ICD-10-CM | POA: Diagnosis not present

## 2018-06-15 ENCOUNTER — Emergency Department (HOSPITAL_COMMUNITY): Payer: Medicaid Other

## 2018-06-15 ENCOUNTER — Observation Stay (HOSPITAL_COMMUNITY)
Admission: EM | Admit: 2018-06-15 | Discharge: 2018-06-16 | Disposition: A | Discharge status: Home / Self Care | Payer: Medicaid Other | Attending: Internal Medicine | Admitting: Internal Medicine

## 2018-06-15 ENCOUNTER — Observation Stay (HOSPITAL_COMMUNITY): Payer: Medicaid Other

## 2018-06-15 ENCOUNTER — Encounter (HOSPITAL_COMMUNITY): Payer: Self-pay

## 2018-06-15 DIAGNOSIS — R0789 Other chest pain: Secondary | ICD-10-CM | POA: Diagnosis present

## 2018-06-15 DIAGNOSIS — Z9861 Coronary angioplasty status: Secondary | ICD-10-CM | POA: Diagnosis present

## 2018-06-15 DIAGNOSIS — E119 Type 2 diabetes mellitus without complications: Secondary | ICD-10-CM | POA: Diagnosis not present

## 2018-06-15 DIAGNOSIS — Z720 Tobacco use: Secondary | ICD-10-CM | POA: Diagnosis present

## 2018-06-15 DIAGNOSIS — K2210 Ulcer of esophagus without bleeding: Secondary | ICD-10-CM | POA: Insufficient documentation

## 2018-06-15 DIAGNOSIS — E1159 Type 2 diabetes mellitus with other circulatory complications: Secondary | ICD-10-CM | POA: Diagnosis present

## 2018-06-15 DIAGNOSIS — E876 Hypokalemia: Secondary | ICD-10-CM | POA: Diagnosis not present

## 2018-06-15 DIAGNOSIS — M069 Rheumatoid arthritis, unspecified: Secondary | ICD-10-CM | POA: Diagnosis present

## 2018-06-15 DIAGNOSIS — E118 Type 2 diabetes mellitus with unspecified complications: Secondary | ICD-10-CM | POA: Diagnosis not present

## 2018-06-15 DIAGNOSIS — I130 Hypertensive heart and chronic kidney disease with heart failure and stage 1 through stage 4 chronic kidney disease, or unspecified chronic kidney disease: Secondary | ICD-10-CM | POA: Diagnosis not present

## 2018-06-15 DIAGNOSIS — E669 Obesity, unspecified: Secondary | ICD-10-CM | POA: Diagnosis present

## 2018-06-15 DIAGNOSIS — E785 Hyperlipidemia, unspecified: Secondary | ICD-10-CM | POA: Diagnosis not present

## 2018-06-15 DIAGNOSIS — H5440 Blindness, one eye, unspecified eye: Secondary | ICD-10-CM | POA: Diagnosis present

## 2018-06-15 DIAGNOSIS — I5022 Chronic systolic (congestive) heart failure: Secondary | ICD-10-CM | POA: Diagnosis not present

## 2018-06-15 DIAGNOSIS — R079 Chest pain, unspecified: Secondary | ICD-10-CM | POA: Diagnosis not present

## 2018-06-15 DIAGNOSIS — I2510 Atherosclerotic heart disease of native coronary artery without angina pectoris: Secondary | ICD-10-CM | POA: Diagnosis not present

## 2018-06-15 DIAGNOSIS — N183 Chronic kidney disease, stage 3 (moderate): Secondary | ICD-10-CM | POA: Diagnosis not present

## 2018-06-15 DIAGNOSIS — Z8719 Personal history of other diseases of the digestive system: Secondary | ICD-10-CM

## 2018-06-15 DIAGNOSIS — E108 Type 1 diabetes mellitus with unspecified complications: Secondary | ICD-10-CM | POA: Diagnosis present

## 2018-06-15 DIAGNOSIS — F17200 Nicotine dependence, unspecified, uncomplicated: Secondary | ICD-10-CM | POA: Diagnosis present

## 2018-06-15 DIAGNOSIS — F319 Bipolar disorder, unspecified: Secondary | ICD-10-CM | POA: Diagnosis not present

## 2018-06-15 DIAGNOSIS — J810 Acute pulmonary edema: Secondary | ICD-10-CM | POA: Diagnosis not present

## 2018-06-15 DIAGNOSIS — K3184 Gastroparesis: Secondary | ICD-10-CM

## 2018-06-15 DIAGNOSIS — E1143 Type 2 diabetes mellitus with diabetic autonomic (poly)neuropathy: Secondary | ICD-10-CM | POA: Diagnosis present

## 2018-06-15 DIAGNOSIS — E1169 Type 2 diabetes mellitus with other specified complication: Secondary | ICD-10-CM | POA: Diagnosis present

## 2018-06-15 DIAGNOSIS — E1122 Type 2 diabetes mellitus with diabetic chronic kidney disease: Secondary | ICD-10-CM | POA: Insufficient documentation

## 2018-06-15 DIAGNOSIS — E1069 Type 1 diabetes mellitus with other specified complication: Secondary | ICD-10-CM | POA: Diagnosis present

## 2018-06-15 DIAGNOSIS — I10 Essential (primary) hypertension: Secondary | ICD-10-CM | POA: Diagnosis present

## 2018-06-15 LAB — CBC WITH DIFFERENTIAL/PLATELET
ABS IMMATURE GRANULOCYTES: 0.05 10*3/uL (ref 0.00–0.07)
BASOS ABS: 0 10*3/uL (ref 0.0–0.1)
Basophils Relative: 0 %
Eosinophils Absolute: 0.1 10*3/uL (ref 0.0–0.5)
Eosinophils Relative: 1 %
HEMATOCRIT: 37.6 % (ref 36.0–46.0)
HEMOGLOBIN: 11.8 g/dL — AB (ref 12.0–15.0)
IMMATURE GRANULOCYTES: 1 %
LYMPHS ABS: 1.8 10*3/uL (ref 0.7–4.0)
LYMPHS PCT: 18 %
MCH: 29.9 pg (ref 26.0–34.0)
MCHC: 31.4 g/dL (ref 30.0–36.0)
MCV: 95.2 fL (ref 80.0–100.0)
Monocytes Absolute: 0.4 10*3/uL (ref 0.1–1.0)
Monocytes Relative: 4 %
NEUTROS ABS: 7.6 10*3/uL (ref 1.7–7.7)
NEUTROS PCT: 76 %
NRBC: 0 % (ref 0.0–0.2)
PLATELETS: 379 10*3/uL (ref 150–400)
RBC: 3.95 MIL/uL (ref 3.87–5.11)
RDW: 18.6 % — ABNORMAL HIGH (ref 11.5–15.5)
WBC: 9.9 10*3/uL (ref 4.0–10.5)

## 2018-06-15 LAB — BASIC METABOLIC PANEL
ANION GAP: 11 (ref 5–15)
BUN: 18 mg/dL (ref 6–20)
CHLORIDE: 105 mmol/L (ref 98–111)
CO2: 20 mmol/L — AB (ref 22–32)
Calcium: 9.4 mg/dL (ref 8.9–10.3)
Creatinine, Ser: 1.45 mg/dL — ABNORMAL HIGH (ref 0.44–1.00)
GFR calc non Af Amer: 41 mL/min — ABNORMAL LOW (ref >60)
GFR, EST AFRICAN AMERICAN: 47 mL/min — AB (ref >60)
Glucose, Bld: 129 mg/dL — ABNORMAL HIGH (ref 70–99)
Potassium: 2.5 mmol/L — CL (ref 3.5–5.1)
Sodium: 136 mmol/L (ref 135–145)

## 2018-06-15 LAB — GLUCOSE, CAPILLARY
GLUCOSE-CAPILLARY: 233 mg/dL — AB (ref 70–99)
GLUCOSE-CAPILLARY: 312 mg/dL — AB (ref 70–99)
Glucose-Capillary: 317 mg/dL — ABNORMAL HIGH (ref 70–99)

## 2018-06-15 LAB — HEPATIC FUNCTION PANEL
ALK PHOS: 134 U/L — AB (ref 38–126)
ALT: 17 U/L (ref 0–44)
AST: 45 U/L — ABNORMAL HIGH (ref 15–41)
Albumin: 3.6 g/dL (ref 3.5–5.0)
BILIRUBIN TOTAL: 0.6 mg/dL (ref 0.3–1.2)
Bilirubin, Direct: 0.1 mg/dL (ref 0.0–0.2)
Indirect Bilirubin: 0.5 mg/dL (ref 0.3–0.9)
Total Protein: 7.4 g/dL (ref 6.5–8.1)

## 2018-06-15 LAB — I-STAT TROPONIN, ED
TROPONIN I, POC: 0 ng/mL (ref 0.00–0.08)
Troponin i, poc: 0 ng/mL (ref 0.00–0.08)

## 2018-06-15 LAB — ETHANOL

## 2018-06-15 LAB — TROPONIN I

## 2018-06-15 LAB — LIPASE, BLOOD: LIPASE: 30 U/L (ref 11–51)

## 2018-06-15 LAB — BRAIN NATRIURETIC PEPTIDE: B NATRIURETIC PEPTIDE 5: 77.4 pg/mL (ref 0.0–100.0)

## 2018-06-15 MED ORDER — FERROUS SULFATE 325 (65 FE) MG PO TABS
325.0000 mg | ORAL_TABLET | Freq: Every day | ORAL | Status: DC
  Administered 2018-06-16: 325 mg via ORAL
  Filled 2018-06-15: qty 1

## 2018-06-15 MED ORDER — FUROSEMIDE 10 MG/ML IJ SOLN
20.0000 mg | Freq: Two times a day (BID) | INTRAMUSCULAR | Status: DC
  Administered 2018-06-15 – 2018-06-16 (×2): 20 mg via INTRAVENOUS
  Filled 2018-06-15 (×3): qty 2

## 2018-06-15 MED ORDER — ALBUTEROL SULFATE HFA 108 (90 BASE) MCG/ACT IN AERS: 2.0000 | INHALATION_SPRAY | Freq: Four times a day (QID) | RESPIRATORY_TRACT | Status: DC | PRN

## 2018-06-15 MED ORDER — IOPAMIDOL (ISOVUE-370) INJECTION 76%
100.0000 mL | Freq: Once | INTRAVENOUS | Status: AC | PRN
  Administered 2018-06-15: 100 mL via INTRAVENOUS

## 2018-06-15 MED ORDER — ALUM & MAG HYDROXIDE-SIMETH 200-200-20 MG/5ML PO SUSP
30.0000 mL | Freq: Once | ORAL | Status: AC
  Administered 2018-06-15: 30 mL via ORAL
  Filled 2018-06-15: qty 30

## 2018-06-15 MED ORDER — INSULIN GLARGINE 100 UNIT/ML SOLOSTAR PEN: 10.0000 [IU] | PEN_INJECTOR | Freq: Two times a day (BID) | SUBCUTANEOUS | Status: DC

## 2018-06-15 MED ORDER — INSULIN GLARGINE 100 UNIT/ML ~~LOC~~ SOLN
10.0000 [IU] | Freq: Two times a day (BID) | SUBCUTANEOUS | Status: DC
  Administered 2018-06-15 – 2018-06-16 (×3): 10 [IU] via SUBCUTANEOUS
  Filled 2018-06-15 (×3): qty 0.1

## 2018-06-15 MED ORDER — ALUM & MAG HYDROXIDE-SIMETH 200-200-20 MG/5ML PO SUSP: 30.0000 mL | ORAL | Status: DC | PRN

## 2018-06-15 MED ORDER — ONDANSETRON HCL 4 MG/2ML IJ SOLN: 4.0000 mg | Freq: Four times a day (QID) | INTRAMUSCULAR | Status: DC | PRN

## 2018-06-15 MED ORDER — POTASSIUM CHLORIDE CRYS ER 20 MEQ PO TBCR
40.0000 meq | EXTENDED_RELEASE_TABLET | Freq: Once | ORAL | Status: AC
  Administered 2018-06-15: 40 meq via ORAL
  Filled 2018-06-15: qty 2

## 2018-06-15 MED ORDER — PANTOPRAZOLE SODIUM 40 MG IV SOLR
40.0000 mg | Freq: Once | INTRAVENOUS | Status: AC
  Administered 2018-06-15: 40 mg via INTRAVENOUS
  Filled 2018-06-15: qty 40

## 2018-06-15 MED ORDER — NICOTINE 14 MG/24HR TD PT24
14.0000 mg | MEDICATED_PATCH | Freq: Every day | TRANSDERMAL | Status: DC
  Administered 2018-06-15 – 2018-06-16 (×2): 14 mg via TRANSDERMAL
  Filled 2018-06-15 (×2): qty 1

## 2018-06-15 MED ORDER — PREDNISOLONE ACETATE 1 % OP SUSP
1.0000 [drp] | Freq: Four times a day (QID) | OPHTHALMIC | Status: DC
  Administered 2018-06-15 – 2018-06-16 (×4): 1 [drp] via OPHTHALMIC
  Filled 2018-06-15: qty 5

## 2018-06-15 MED ORDER — OXYCODONE-ACETAMINOPHEN 5-325 MG PO TABS
1.0000 | ORAL_TABLET | Freq: Four times a day (QID) | ORAL | Status: DC | PRN
  Administered 2018-06-15 – 2018-06-16 (×3): 1 via ORAL
  Filled 2018-06-15 (×3): qty 1

## 2018-06-15 MED ORDER — CLOPIDOGREL BISULFATE 75 MG PO TABS
75.0000 mg | ORAL_TABLET | Freq: Every day | ORAL | Status: DC
  Administered 2018-06-15 – 2018-06-16 (×2): 75 mg via ORAL
  Filled 2018-06-15 (×2): qty 1

## 2018-06-15 MED ORDER — IOPAMIDOL (ISOVUE-370) INJECTION 76%
INTRAVENOUS | Status: AC
  Filled 2018-06-15: qty 100

## 2018-06-15 MED ORDER — INSULIN ASPART 100 UNIT/ML ~~LOC~~ SOLN
0.0000 [IU] | Freq: Three times a day (TID) | SUBCUTANEOUS | Status: DC
  Administered 2018-06-15: 11 [IU] via SUBCUTANEOUS
  Administered 2018-06-15 – 2018-06-16 (×2): 5 [IU] via SUBCUTANEOUS

## 2018-06-15 MED ORDER — POTASSIUM CHLORIDE 10 MEQ/100ML IV SOLN
10.0000 meq | Freq: Once | INTRAVENOUS | Status: AC
  Administered 2018-06-15: 10 meq via INTRAVENOUS
  Filled 2018-06-15: qty 100

## 2018-06-15 MED ORDER — MORPHINE SULFATE (PF) 2 MG/ML IV SOLN: 2.0000 mg | Freq: Once | INTRAVENOUS | Status: DC

## 2018-06-15 MED ORDER — FOLIC ACID 1 MG PO TABS
1.0000 mg | ORAL_TABLET | Freq: Every day | ORAL | Status: DC
  Administered 2018-06-15 – 2018-06-16 (×2): 1 mg via ORAL
  Filled 2018-06-15 (×2): qty 1

## 2018-06-15 MED ORDER — SERTRALINE HCL 100 MG PO TABS
100.0000 mg | ORAL_TABLET | Freq: Every day | ORAL | Status: DC
  Administered 2018-06-15 – 2018-06-16 (×2): 100 mg via ORAL
  Filled 2018-06-15 (×2): qty 1

## 2018-06-15 MED ORDER — MAGNESIUM SULFATE 2 GM/50ML IV SOLN
2.0000 g | Freq: Once | INTRAVENOUS | Status: AC
  Administered 2018-06-15: 2 g via INTRAVENOUS
  Filled 2018-06-15: qty 50

## 2018-06-15 MED ORDER — METOCLOPRAMIDE HCL 5 MG/ML IJ SOLN
5.0000 mg | Freq: Once | INTRAMUSCULAR | Status: AC
  Administered 2018-06-15: 5 mg via INTRAVENOUS
  Filled 2018-06-15: qty 2

## 2018-06-15 MED ORDER — ACETAMINOPHEN 325 MG PO TABS
650.0000 mg | ORAL_TABLET | ORAL | Status: DC | PRN
  Filled 2018-06-15: qty 2

## 2018-06-15 MED ORDER — ROSUVASTATIN CALCIUM 20 MG PO TABS
40.0000 mg | ORAL_TABLET | Freq: Every day | ORAL | Status: DC
  Administered 2018-06-15: 40 mg via ORAL
  Filled 2018-06-15: qty 2

## 2018-06-15 MED ORDER — ENOXAPARIN SODIUM 40 MG/0.4ML ~~LOC~~ SOLN
40.0000 mg | Freq: Every day | SUBCUTANEOUS | Status: DC
  Administered 2018-06-15: 40 mg via SUBCUTANEOUS
  Filled 2018-06-15: qty 0.4

## 2018-06-15 MED ORDER — ONDANSETRON HCL 4 MG/2ML IJ SOLN
4.0000 mg | Freq: Once | INTRAMUSCULAR | Status: AC
  Administered 2018-06-15: 4 mg via INTRAVENOUS
  Filled 2018-06-15: qty 2

## 2018-06-15 MED ORDER — FENTANYL CITRATE (PF) 100 MCG/2ML IJ SOLN
50.0000 ug | Freq: Once | INTRAMUSCULAR | Status: AC
  Administered 2018-06-15: 50 ug via INTRAVENOUS
  Filled 2018-06-15: qty 2

## 2018-06-15 MED ORDER — METOPROLOL TARTRATE 25 MG PO TABS
25.0000 mg | ORAL_TABLET | Freq: Two times a day (BID) | ORAL | Status: DC
  Administered 2018-06-15 – 2018-06-16 (×3): 25 mg via ORAL
  Filled 2018-06-15 (×3): qty 1

## 2018-06-15 MED ORDER — NITROGLYCERIN 2 % TD OINT
1.0000 [in_us] | TOPICAL_OINTMENT | Freq: Once | TRANSDERMAL | Status: DC
  Filled 2018-06-15: qty 30

## 2018-06-15 MED ORDER — ALBUTEROL SULFATE (2.5 MG/3ML) 0.083% IN NEBU: 2.5000 mg | INHALATION_SOLUTION | Freq: Four times a day (QID) | RESPIRATORY_TRACT | Status: DC | PRN

## 2018-06-15 MED ORDER — METOCLOPRAMIDE HCL 10 MG PO TABS
10.0000 mg | ORAL_TABLET | Freq: Three times a day (TID) | ORAL | Status: DC
  Administered 2018-06-15 – 2018-06-16 (×3): 10 mg via ORAL
  Filled 2018-06-15 (×3): qty 1

## 2018-06-15 MED ORDER — INSULIN ASPART 100 UNIT/ML ~~LOC~~ SOLN
0.0000 [IU] | Freq: Every day | SUBCUTANEOUS | Status: DC
  Administered 2018-06-15: 4 [IU] via SUBCUTANEOUS

## 2018-06-15 MED ORDER — LIDOCAINE VISCOUS HCL 2 % MT SOLN
15.0000 mL | Freq: Once | OROMUCOSAL | Status: AC
  Administered 2018-06-15: 15 mL via ORAL
  Filled 2018-06-15: qty 15

## 2018-06-15 MED ORDER — PANTOPRAZOLE SODIUM 40 MG PO TBEC
40.0000 mg | DELAYED_RELEASE_TABLET | Freq: Two times a day (BID) | ORAL | Status: DC
  Administered 2018-06-15 – 2018-06-16 (×3): 40 mg via ORAL
  Filled 2018-06-15 (×3): qty 1

## 2018-06-15 NOTE — ED Notes (Signed)
Pt c.o. Nausea/chest pain with movement, and like it is "cold when she breathes." MD at bedside, ordering meds for pt.

## 2018-06-15 NOTE — ED Notes (Signed)
Report given to Greg RN.

## 2018-06-15 NOTE — Plan of Care (Signed)
Tolerates PO diet. Denies diarrhea stools since this morning. Enteric precautions continued pending PCR. CIWA.

## 2018-06-15 NOTE — ED Notes (Signed)
Attempted report 

## 2018-06-15 NOTE — H&P (Signed)
PCP:   Julieanne Manson, MD   Chief Complaint:  Chest opain  HPI: This is a 52 y/o female with PMHx significant for CAD, DM with complication, HTN, dyslipidemia who presents with c/o chest pain. She states she was at a birthday party yesterday and ate crab, fish and shrimp, some of which were spicy. She states at 11PM she felt as if someone put his fist into her chest and did not let go. Her pain was centrally located and radiated to her back. It was continuous sharp and burning. She had associated SOB. She hurt with movement, cough. She took 2 NTG which were expired and they did not help. Her pain was continuous 10/10. She decided to come to the ER. She reports being nauseous but not vomiting. She has diarrhea but this has been on going for a week. Patient is uncomfortable in the ER  History provided by the patient   Review of Systems:  The patient denies anorexia, fever, weight loss,, vision loss, decreased hearing, hoarseness, chest pain, syncope, dyspnea on exertion, peripheral edema, balance deficits, hemoptysis, abdominal pain, melena, hematochezia, nausea, diarrhea, severe indigestion/heartburn, hematuria, incontinence, genital sores, muscle weakness, suspicious skin lesions, transient blindness, difficulty walking, depression, unusual weight change, abnormal bleeding, enlarged lymph nodes, angioedema, and breast masses.  Past Medical History: Past Medical History:  Diagnosis Date  . Acute myocardial infarction of other lateral wall, initial episode of care   . Acute myocardial infarction, unspecified site, initial episode of care   . Acute osteomyelitis   . Allergic rhinitis   . Anemia   . Anginal pain (HCC)    07/15/13- no chest pain in months"  . Anxiety   . Bipolar affective (HCC)   . CAD (coronary artery disease) 07/2011   s/p DES mid and distal RCA with 50% LAD  . Daily headache    not daily  . Depression    Bipolar disorder  . Diabetic gastroparesis associated with  type 2 diabetes mellitus (HCC)   . Diabetic peripheral neuropathy associated with type 2 diabetes mellitus (HCC)   . Diabetic retinopathy   . Esophageal stenosis   . Esophageal ulcer   . Esophagitis   . Gastroparesis   . Genital herpes    Reportedly tested and documented by [facility]--rare occurrences  . GERD (gastroesophageal reflux disease)   . Heart murmur   . History of stomach ulcers   . Hyperlipidemia   . Hypertension   . 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 Migraines   . Orthopnoea   . Pneumonia 2012  . Polysubstance abuse (HCC)    Crack cocaine--none since 2008, MJ, ETOH:  clean of all since 2008  . Renal insufficiency   . Rheumatoid arthritis(714.0)   . Sebaceous cyst   . Stroke [facility]) 2011   denies residual on 12/19/2012.  "Years ago"  . Type II diabetes mellitus (HCC)    Previously uncontrolled for many years with multiple complications.  2017 controlled. 05/2016:  6.7%   Past Surgical History:  Procedure Laterality Date  . CORONARY ANGIOPLASTY WITH STENT PLACEMENT  01/20/2009   "2" (12/19/2012)  . CORONARY ANGIOPLASTY WITH STENT PLACEMENT  2012   "2" (12/19/2012)  . CORONARY ANGIOPLASTY WITH STENT PLACEMENT  12/19/2012   "2" (12/19/2012)  . ESOPHAGOGASTRODUODENOSCOPY N/A 02/26/2015   Procedure: ESOPHAGOGASTRODUODENOSCOPY (EGD);  Surgeon: Dorena Cookey, MD;  Location: [facility] ENDOSCOPY;  Service: Endoscopy;  Laterality: N/A;  . EYE  SURGERY     Multiple surgeries of both eyes:  last laser was 09/08/2015 of right eye.  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 GAS INSERTION Left 07/16/2013   Procedure: INSERTION OF GAS;  Surgeon: Shade Flood, MD;  Location[facility] OR;  Service: Ophthalmology;  Laterality: Left;  SF6  . GAS/FLUID EXCHANGE Left 07/30/2013   Procedure: GAS/FLUID EXCHANGE;  Surgeon: Shade Flood, MD;  Location: [facility] OR;  Service: Ophthalmology;  Laterality: Left;  . IRRIGATION AND  DEBRIDEMENT SEBACEOUS CYST Right 03/2011   "pointer" (12/19/2012)  . LEFT HEART CATHETERIZATION WITH CORONARY ANGIOGRAM N/A 07/06/2011   Procedure: LEFT HEART CATHETERIZATION WITH CORONARY ANGIOGRAM;  Surgeon: Corky Crafts, MD;  Location: [facility] CATH LAB;  Service: Cardiovascular;  Laterality: N/A;  possible PCI  . LEFT HEART CATHETERIZATION WITH CORONARY ANGIOGRAM N/A 12/19/2012   Procedure: LEFT HEART CATHETERIZATION WITH CORONARY ANGIOGRAM;  Surgeon: Corky Crafts, MD;  Location: [facility] CATH LAB;  Service: Cardiovascular;  Laterality: N/A;  . MEMBRANE PEEL Left 07/16/2013   Procedure: MEMBRANE PEEL;  Surgeon: Shade Flood, MD;  Location: [facility] OR;  Service: Ophthalmology;  Laterality: Left;  . PARS PLANA VITRECTOMY Left 07/16/2013   Procedure: PARS PLANA VITRECTOMY WITH 23 GAUGE;  Surgeon: Shade Flood, MD;  Location: [facility] OR;  Service: Ophthalmology;  Laterality: Left;  . PARS PLANA VITRECTOMY Left 07/30/2013   Procedure: PARS PLANA VITRECTOMY WITH 23 GAUGE WITH ENDOLASER;  Surgeon: Shade Flood, MD;  Location: [facility] OR;  Service: Ophthalmology;  Laterality: Left;  with endolaser  . PERCUTANEOUS CORONARY STENT INTERVENTION (PCI-S) N/A 07/06/2011   Procedure: PERCUTANEOUS CORONARY STENT INTERVENTION (PCI-S);  Surgeon: Corky Crafts, MD;  Location: [facility] CATH LAB;  Service: Cardiovascular;  Laterality: N/A;  . PHOTOCOAGULATION WITH LASER Left 07/16/2013   Procedure: PHOTOCOAGULATION WITH LASER;  Surgeon: Shade Flood, MD;  Location: [facility] OR;  Service: Ophthalmology;  Laterality: Left;  ENDOLASER    Medications: Prior to Admission medications   Medication Sig Start Date End Date Taking? Authorizing Provider  acetaminophen (TYLENOL) 500 MG tablet Take 500-1,000 mg by mouth every 6 (six) hours as needed for moderate pain.    Yes [provider]  albuterol (PROVENTIL HFA;VENTOLIN HFA) 108 (90 Base) MCG/ACT inhaler Inhale 2 puffs into the lungs every 6 (six) hours as needed for wheezing or  shortness of breath. 12/01/16  Yes Julieanne Manson, MD  clopidogrel (PLAVIX) 75 MG tablet Take 1 tablet (75 mg total) by mouth daily. 11/27/17  Yes Corky Crafts, MD  cyclobenzaprine (FLEXERIL) 10 MG tablet Take 1-2 tablets by mouth at bedtime as needed. Muscle spasms 09/21/16  Yes [provider]  ferrous sulfate 325 (65 FE) MG tablet Take 1 tablet (325 mg total) by mouth daily with breakfast. 01/25/16  Yes Julieanne Manson, MD  folic acid (FOLVITE) 1 MG tablet Take 1 mg by mouth daily.   Yes [provider]  furosemide (LASIX) 40 MG tablet TAKE 1 TABLET BY MOUTH EVERY MORNING Patient taking differently: Take 40 mg by mouth daily.  07/16/17  Yes Julieanne Manson, MD  insulin aspart (NOVOLOG) 100 UNIT/ML injection Inject 4-16 Units into the skin 3 (three) times daily with meals. Sliding scale CBG 100-150; 4 units, 151-200; 6 units, 201-250; 8 units, 251-300; 10 units, 301-350; 12 units, 351-400; 14 units, > 401; call doctor   Yes [provider]  LANTUS SOLOSTAR 100 UNIT/ML Solostar Pen Inject 10 Units into the skin 2 (two) times daily.  01/15/18  Yes  [provider]  methotrexate (RHEUMATREX) 2.5 MG tablet 3 tabs by mouth twice weekly Patient taking differently: Take 7.5 mg by mouth 2 (two) times a week. On Wednesday and Thursday 04/09/18  Yes Julieanne Manson, MD  metoCLOPramide (REGLAN) 10 MG tablet Take 0.5 tablets (5 mg total) by mouth 4 (four) times daily -  before meals and at bedtime. Patient taking differently: Take 10 mg by mouth 4 (four) times daily -  before meals and at bedtime.  08/03/15  Yes Julieanne Manson, MD  metoprolol tartrate (LOPRESSOR) 25 MG tablet Take 1 tablet (25 mg total) by mouth 2 (two) times daily. 08/29/17  Yes Julieanne Manson, MD  metroNIDAZOLE (FLAGYL) 500 MG tablet Take 500 mg by mouth 3 (three) times daily.   Yes [provider]  nitroGLYCERIN (NITROSTAT) 0.4 MG SL tablet Place 1 tablet (0.4 mg total)  under the tongue every 5 (five) minutes as needed for chest pain. 05/29/17  Yes Corky Crafts, MD  ondansetron (ZOFRAN) 4 MG tablet Take 1 tablet (4 mg total) by mouth every 6 (six) hours. 04/13/18  Yes Elpidio Anis, PA-C  oxyCODONE-acetaminophen (PERCOCET/ROXICET) 5-325 MG tablet Take 1-2 tablets by mouth every 6 (six) hours as needed for severe pain. 04/13/18  Yes Upstill, Melvenia Beam, PA-C  pantoprazole (PROTONIX) 40 MG tablet Take 1 tablet (40 mg total) by mouth 2 (two) times daily. 08/29/17  Yes Julieanne Manson, MD  prednisoLONE acetate (PRED FORTE) 1 % ophthalmic suspension Place 4-5 drops into the left eye daily. 04/22/17  Yes [provider]  PROCTOSOL HC 2.5 % rectal cream Place 1 application rectally 2 (two) times daily.  06/07/18  Yes [provider]  rosuvastatin (CRESTOR) 40 MG tablet Take 1 tablet (40 mg total) by mouth daily. 11/27/17  Yes Corky Crafts, MD  sertraline (ZOLOFT) 100 MG tablet 1 tab by mouth daily Patient taking differently: Take 100 mg by mouth daily.  06/10/18  Yes Julieanne Manson, MD  traMADol (ULTRAM) 50 MG tablet TAKE 1 TABLET EVERY 6 HOURS AS NEEDED FOR MODERATE PAIN Patient taking differently: Take 50 mg by mouth every 6 (six) hours as needed for moderate pain.  03/06/17  Yes Julieanne Manson, MD  Incontinence Supply Disposable (POISE ULTRA THINS) PADS Use 5 pads daily as needed for urinary stress incontinence 01/03/16   Julieanne Manson, MD    Allergies:   Allergies  Allergen Reactions  . Aspirin Other (See Comments)    Stomach bleeds Stomach bleeds GI bleeding Other reaction(s): Other (See Comments) GI bleeding Stomach bleeds     Social History:  reports that she has been smoking cigarettes. She has a 7.26 pack-year smoking history. She has never used smokeless tobacco. She reports that she drinks alcohol. She reports that she does not use drugs.  Family History: Family History  Problem Relation Age of Onset  .  Breast cancer Mother 2  . Hypertension Father   . Stomach cancer Sister 30       cause of death  . Hypertension Sister   . Lung cancer Sister 36       Cause of death  . Cancer Sister 74       ? stomach cancer  . Colon cancer Neg Hx     Physical Exam: Vitals:   06/15/18 0545 06/15/18 0615 06/15/18 0645 06/15/18 0700  BP: (!) 123/52 (!) 117/52 133/69 135/72  Pulse: 84 86 87 87  Resp: (!) 22 20 (!) 26   Temp:  TempSrc:      SpO2: 95% 94% 92% 92%    General:  Alert and oriented times three, well developed and nourished, uncomfortable Eyes: PERRLA, pink conjunctiva, no scleral icterus, blind left eye ENT: Moist oral mucosa, neck supple, no thyromegaly Lungs: clear to ascultation, no wheeze, no crackles, no use of accessory muscles Cardiovascular: regular rate and rhythm, no regurgitation, no gallops, no murmurs. No carotid bruits, no JVD Abdomen: soft, positive BS, non-tender, non-distended, no organomegaly, not an acute abdomen GU: not examined Neuro: CN II - XII grossly intact, sensation intact Musculoskeletal: strength 5/5 all extremities, no clubbing, cyanosis or edema, left ankle ulcer Skin: no rash, no subcutaneous crepitation, no decubitus Psych: appropriate patient Chest pain: centrally located reproducible chest pain    Labs on Admission:  Recent Labs    06/15/18 0312  NA 136  K 2.5*  CL 105  CO2 20*  GLUCOSE 129*  BUN 18  CREATININE 1.45*  CALCIUM 9.4   Recent Labs    06/15/18 0312  AST 45*  ALT 17  ALKPHOS 134*  BILITOT 0.6  PROT 7.4  ALBUMIN 3.6   Recent Labs    06/15/18 0312  LIPASE 30   Recent Labs    06/15/18 0312  WBC 9.9  NEUTROABS 7.6  HGB 11.8*  HCT 37.6  MCV 95.2  PLT 379   No results for input(s): CKTOTAL, CKMB, CKMBINDEX, TROPONINI in the last 72 hours. Invalid input(s): POCBNP No results for input(s): DDIMER in the last 72 hours. No results for input(s): HGBA1C in the last 72 hours. No results for input(s): CHOL,  HDL, LDLCALC, TRIG, CHOLHDL, LDLDIRECT in the last 72 hours. No results for input(s): TSH, T4TOTAL, T3FREE, THYROIDAB in the last 72 hours.  Invalid input(s): FREET3 No results for input(s): VITAMINB12, FOLATE, FERRITIN, TIBC, IRON, RETICCTPCT in the last 72 hours.  Micro Results: No results found for this or any previous visit (from the past 240 hour(s)).   Radiological Exams on Admission: Dg Chest Port 1 View  Result Date: 06/15/2018 CLINICAL DATA:  Chest pain after eating crab C. EXAM: PORTABLE CHEST 1 VIEW COMPARISON:  Chest radiograph April 12, 2018 FINDINGS: Cardiac silhouette is mildly enlarged and unchanged. Coronary artery stent. Mediastinal silhouette is unremarkable. Pulmonary vascular congestion interstitial prominence somewhat confluent in lung bases. No pleural effusion or focal consolidation. No pneumothorax. Soft tissue planes and included osseous structures are non suspicious. IMPRESSION: Mild cardiomegaly. Interstitial prominence concerning for pulmonary edema. Electronically Signed   By: Awilda Metro M.D.   On: 06/15/2018 03:32    Assessment/Plan Present on Admission: . Chest pain -bring in for 23 hour obs on medtele -ACS orderset initiated given cardiac history but suspect a GI cause. Cycle xcardiac enzymes -GI cocktail ordered in the ER, IV protonix and reglan ordered for now -home protonix BiD and reglan continued -Maalox added PRN  . Coronary atherosclerosis -see above, home meds resumed  . Hypokalemia -repleing PO and IV -magnesium repelted in ER. Will check a level. likely secondary to diarrhea and GI issues  . Chronic systolic congestive heart failure, NYHA class 1 (HCC) -CXR with possible fluid overload. gentle IV lasix  Diarrhea -stool cultures ordered  . Tobacco abuse -nicotine patch,, duonebs PRN  . Diabetes mellitus with multiple complications (HCC) -SSI insulin. Home meds resumed  . Diabetic gastroparesis (HCC) -stable, home meds  resumed  . Hyperlipidemia -stable, home meds resumed  . HTN (hypertension) -stable, home meds resumed  . Rheumatoid arthritis (HCC) -metothrexate held for  now, folic acid continued  . Blind left eye -aware   Yi Haugan 06/15/2018, 7:53 AM

## 2018-06-15 NOTE — ED Provider Notes (Signed)
[facility] EMERGENCY DEPARTMENT Provider Note   CSN: [id number] Arrival date & time:        History   Chief Complaint Chief Complaint  Patient presents with  . Chest Pain    HPI [name] [name] is a 52 y.o. female.  The history is provided by the patient.  Chest Pain   This is a new problem. The current episode started 3 to 5 hours ago. The problem occurs constantly. The problem has been gradually worsening. The pain is associated with eating. The pain is severe. The quality of the pain is described as pressure-like. The pain does not radiate. Associated symptoms include diaphoresis and shortness of breath. She has tried nitroglycerin for the symptoms. The treatment provided no relief.  Her past medical history is significant for CAD.  Patient with multiple medical conditions including CAD, bipolar, diabetes presents with chest pain.  She reports several hours ago she had onset of chest pain after eating crab legs.  She reports chest pain/shortness of breath.  Reports it feels similar to prior MI.  She took 2 expired nitro without relief. She has ASA allergy Past Medical History:  Diagnosis Date  . Acute myocardial infarction of other lateral wall, initial episode of care   . Acute myocardial infarction, unspecified site, initial episode of care   . Acute osteomyelitis   . Allergic rhinitis   . Anemia   . Anginal pain (HCC)    07/15/13- no chest pain in months"  . Anxiety   . Bipolar affective (HCC)   . CAD (coronary artery disease) 07/2011   s/p DES mid and distal RCA with 50% LAD  . Daily headache    not daily  . Depression    Bipolar disorder  . Diabetic gastroparesis associated with type 2 diabetes mellitus (HCC)   . Diabetic peripheral neuropathy associated with type 2 diabetes mellitus (HCC)   . Diabetic retinopathy   . Esophageal stenosis   . Esophageal ulcer   . Esophagitis   . Gastroparesis   . Genital herpes    Reportedly tested and documented  by [facility]--rare occurrences  . GERD (gastroesophageal reflux disease)   . Heart murmur   . History of stomach ulcers   . Hyperlipidemia   . Hypertension   . 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 Migraines   . Orthopnoea   . Pneumonia 2012  . Polysubstance abuse (HCC)    Crack cocaine--none since 2008, MJ, ETOH:  clean of all since 2008  . Renal insufficiency   . Rheumatoid arthritis(714.0)   . Sebaceous cyst   . Stroke [facility]) 2011   denies residual on 12/19/2012.  "Years ago"  . Type II diabetes mellitus (HCC)    Previously uncontrolled for many years with multiple complications.  2017 controlled. 05/2016:  6.7%    Patient Active Problem List   Diagnosis Date Noted  . 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 Diabetic gastroparesis associated with type 2 diabetes mellitus (HCC)   . Diabetic peripheral neuropathy associated with type 2 diabetes mellitus (HCC)   . Acute osteomyelitis of left foot (HCC) 02/15/2018  . Foot pain 02/15/2018  . Nausea and vomiting 02/15/2018  . Hypokalemia 02/15/2018  . Urinary, incontinence, stress female 10/14/2015  . Obesity (BMI 30-39.9) 06/21/2015  . Acute renal failure syndrome (HCC)   . CKD (chronic kidney disease), stage III (HCC) 02/21/2015  . Chronic systolic congestive heart  failure, NYHA class 1 (HCC) 02/21/2015  . History of NSTEMI w/ DES to cfx May 2014 10/02/2014  . 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 Hyperlipidemia   . Tobacco abuse 10/03/2013  . Diabetic gastroparesis (HCC) 01/16/2013  . Nausea vomiting and diarrhea 12/29/2012  . Coronary atherosclerosis 08/25/2010  . Dehydration 06/17/2010  . Anemia 03/21/2010  . ALLERGIC RHINITIS 12/28/2009  . HTN (hypertension) 10/08/2009  . Rheumatoid arthritis (HCC) 02/18/2009  . Diabetic retinopathy (HCC) 08/13/2007  . GERD 03/27/2007  . RENAL INSUFFICIENCY, CHRONIC 03/27/2007  . Anxiety state 03/25/2007  . DEPRESSION 03/25/2007  .  ULCER, ESOPHAGUS WITHOUT BLEEDING 07/06/2006  . ESOPHAGEAL STENOSIS 07/06/2006  . Diabetes mellitus with multiple complications (HCC) 06/21/2006    Past Surgical History:  Procedure Laterality Date  . CORONARY ANGIOPLASTY WITH STENT PLACEMENT  01/20/2009   "2" (12/19/2012)  . CORONARY ANGIOPLASTY WITH STENT PLACEMENT  2012   "2" (12/19/2012)  . CORONARY ANGIOPLASTY WITH STENT PLACEMENT  12/19/2012   "2" (12/19/2012)  . ESOPHAGOGASTRODUODENOSCOPY N/A 02/26/2015   Procedure: ESOPHAGOGASTRODUODENOSCOPY (EGD);  Surgeon: Dorena Cookey, MD;  Location: [facility] ENDOSCOPY;  Service: Endoscopy;  Laterality: N/A;  . EYE SURGERY     Multiple surgeries of both eyes:  last laser was 09/08/2015 of right eye.  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 GAS INSERTION Left 07/16/2013   Procedure: INSERTION OF GAS;  Surgeon: Shade Flood, MD;  Location: [facility] OR;  Service: Ophthalmology;  Laterality: Left;  SF6  . GAS/FLUID EXCHANGE Left 07/30/2013   Procedure: GAS/FLUID EXCHANGE;  Surgeon: Shade Flood, MD;  Location: [facility] OR;  Service: Ophthalmology;  Laterality: Left;  . IRRIGATION AND DEBRIDEMENT SEBACEOUS CYST Right 03/2011   "pointer" (12/19/2012)  . LEFT HEART CATHETERIZATION WITH CORONARY ANGIOGRAM N/A 07/06/2011   Procedure: LEFT HEART CATHETERIZATION WITH CORONARY ANGIOGRAM;  Surgeon: Corky Crafts, MD;  Location: [facility] CATH LAB;  Service: Cardiovascular;  Laterality: N/A;  possible PCI  . LEFT HEART CATHETERIZATION WITH CORONARY ANGIOGRAM N/A 12/19/2012   Procedure: LEFT HEART CATHETERIZATION WITH CORONARY ANGIOGRAM;  Surgeon: Corky Crafts, MD;  Location: [facility] CATH LAB;  Service: Cardiovascular;  Laterality: N/A;  . MEMBRANE PEEL Left 07/16/2013   Procedure: MEMBRANE PEEL;  Surgeon: Shade Flood, MD;  Location: [facility] OR;  Service: Ophthalmology;  Laterality: Left;  . PARS PLANA VITRECTOMY Left 07/16/2013   Procedure: PARS PLANA VITRECTOMY WITH 23 GAUGE;  Surgeon: Shade Flood, MD;  Location:  [facility] OR;  Service: Ophthalmology;  Laterality: Left;  . PARS PLANA VITRECTOMY Left 07/30/2013   Procedure: PARS PLANA VITRECTOMY WITH 23 GAUGE WITH ENDOLASER;  Surgeon: Shade Flood, MD;  Location: [facility] OR;  Service: Ophthalmology;  Laterality: Left;  with endolaser  . PERCUTANEOUS CORONARY STENT INTERVENTION (PCI-S) N/A 07/06/2011   Procedure: PERCUTANEOUS CORONARY STENT INTERVENTION (PCI-S);  Surgeon: Corky Crafts, MD;  Location: [facility] CATH LAB;  Service: Cardiovascular;  Laterality: N/A;  . PHOTOCOAGULATION WITH LASER Left 07/16/2013   Procedure: PHOTOCOAGULATION WITH LASER;  Surgeon: Shade Flood, MD;  Location: [facility] OR;  Service: Ophthalmology;  Laterality: Left;  ENDOLASER     OB History   None      Home Medications    Prior to Admission medications   Medication Sig Start Date End Date Taking? Authorizing Provider  acetaminophen (TYLENOL) 500 MG tablet Take 500-1,000 mg by mouth every 6 (six) hours as needed for moderate pain.     [provider]  albuterol (PROVENTIL HFA;VENTOLIN HFA) 108 (90 Base) MCG/ACT inhaler Inhale  2 puffs into the lungs every 6 (six) hours as needed for wheezing or shortness of breath. 12/01/16   Julieanne Manson, MD  clopidogrel (PLAVIX) 75 MG tablet Take 1 tablet (75 mg total) by mouth daily. 11/27/17   Corky Crafts, MD  cyclobenzaprine (FLEXERIL) 10 MG tablet Take 1-2 tablets by mouth at bedtime as needed. Muscle spasms 09/21/16   [provider]  ferrous sulfate 325 (65 FE) MG tablet Take 1 tablet (325 mg total) by mouth daily with breakfast. 01/25/16   Julieanne Manson, MD  folic acid (FOLVITE) 1 MG tablet Take 1 mg by mouth daily.    [provider]  furosemide (LASIX) 40 MG tablet TAKE 1 TABLET BY MOUTH EVERY MORNING 07/16/17   Julieanne Manson, MD  Incontinence Supply Disposable (POISE ULTRA THINS) PADS Use 5 pads daily as needed for urinary stress incontinence 01/03/16   Julieanne Manson, MD  insulin aspart  (NOVOLOG) 100 UNIT/ML injection Inject 4-16 Units into the skin 3 (three) times daily with meals. Sliding scale CBG 100-150; 4 units, 151-200; 6 units, 201-250; 8 units, 251-300; 10 units, 301-350; 12 units, 351-400; 14 units, > 401; call doctor    [provider]  LANTUS SOLOSTAR 100 UNIT/ML Solostar Pen 10 UNITS EACH MORNING, 10 UNITS AT BEDTIME TWICE A DAY PER DR. KERR SUBCUTANEOUS 01/15/18   [provider]  methotrexate (RHEUMATREX) 2.5 MG tablet 3 tabs by mouth twice weekly 04/09/18   Julieanne Manson, MD  metoCLOPramide (REGLAN) 10 MG tablet Take 0.5 tablets (5 mg total) by mouth 4 (four) times daily -  before meals and at bedtime. Patient taking differently: Take 10 mg by mouth 4 (four) times daily -  before meals and at bedtime.  08/03/15   Julieanne Manson, MD  metoprolol tartrate (LOPRESSOR) 25 MG tablet Take 1 tablet (25 mg total) by mouth 2 (two) times daily. 08/29/17   Julieanne Manson, MD  nitroGLYCERIN (NITROSTAT) 0.4 MG SL tablet Place 1 tablet (0.4 mg total) under the tongue every 5 (five) minutes as needed for chest pain. 05/29/17   Corky Crafts, MD  ondansetron (ZOFRAN) 4 MG tablet Take 1 tablet (4 mg total) by mouth every 6 (six) hours. 04/13/18   Elpidio Anis, PA-C  oxyCODONE-acetaminophen (PERCOCET/ROXICET) 5-325 MG tablet Take 1-2 tablets by mouth every 6 (six) hours as needed for severe pain. 04/13/18   Elpidio Anis, PA-C  pantoprazole (PROTONIX) 40 MG tablet Take 1 tablet (40 mg total) by mouth 2 (two) times daily. 08/29/17   Julieanne Manson, MD  prednisoLONE acetate (PRED FORTE) 1 % ophthalmic suspension Place 4-5 drops into the left eye daily. 04/22/17   [provider]  rosuvastatin (CRESTOR) 40 MG tablet Take 1 tablet (40 mg total) by mouth daily. 11/27/17   Corky Crafts, MD  sertraline (ZOLOFT) 100 MG tablet 1 tab by mouth daily 06/10/18   Julieanne Manson, MD  traMADol (ULTRAM) 50 MG tablet TAKE 1 TABLET EVERY 6 HOURS  AS NEEDED FOR MODERATE PAIN 03/06/17   Julieanne Manson, MD    Family History Family History  Problem Relation Age of Onset  . Breast cancer Mother 77  . Hypertension Father   . Stomach cancer Sister 79       cause of death  . Hypertension Sister   . Lung cancer Sister 65       Cause of death  . Cancer Sister 60       ? stomach cancer  . Colon cancer Neg Hx  Social History Social History   Tobacco Use  . Smoking status: Current Every Day Smoker    Packs/day: 0.33    Years: 22.00    Pack years: 7.26    Types: Cigarettes  . Smokeless tobacco: Never Used  . Tobacco comment: Has been to classes--not sure if she wants to quit.  Changing to non menthol..  Chantix, patches, gum never helped.  9/19:  lot going on.  Substance Use Topics  . Alcohol use: Yes    Alcohol/week: 0.0 standard drinks    Comment: socially--holidays  . Drug use: No    Types: "Crack" cocaine, Marijuana    Comment: 12/31/2012 " clean from crack and marijuana since 2008"     Allergies   Aspirin   Review of Systems Review of Systems  Constitutional: Positive for diaphoresis.  Respiratory: Positive for shortness of breath.   Cardiovascular: Positive for chest pain.  All other systems reviewed and are negative.    Physical Exam Updated Vital Signs BP 104/67   Pulse 76   Temp 98 F (36.7 C) (Oral)   Resp (!) 21   LMP 01/12/2015   SpO2 100%   Physical Exam CONSTITUTIONAL: Disheveled, crying HEAD: Normocephalic/atraumatic EYES: EOMI/PERRL ENMT: Mucous membranes moist NECK: supple no meningeal signs SPINE/BACK:entire spine nontender CV: S1/S2 noted, no murmurs/rubs/gallops noted LUNGS: Lungs are clear to auscultation bilaterally, no apparent distress ABDOMEN: soft, nontender, no rebound or guarding, bowel sounds noted throughout abdomen GU:no cva tenderness NEURO: Pt is awake/alert/appropriate, moves all extremitiesx4.  No facial droop.   EXTREMITIES: pulses normal/equal, full  ROM SKIN: warm, color normal PSYCH: Anxious   ED Treatments / Results  Labs (all labs ordered are listed, but only abnormal results are displayed) Labs Reviewed  BASIC METABOLIC PANEL - Abnormal; Notable for the following components:      Result Value   Potassium 2.5 (*)    CO2 20 (*)    Glucose, Bld 129 (*)    Creatinine, Ser 1.45 (*)    GFR calc non Af Amer 41 (*)    GFR calc Af Amer 47 (*)    All other components within normal limits  CBC WITH DIFFERENTIAL/PLATELET - Abnormal; Notable for the following components:   Hemoglobin 11.8 (*)    RDW 18.6 (*)    All other components within normal limits  HEPATIC FUNCTION PANEL - Abnormal; Notable for the following components:   AST 45 (*)    Alkaline Phosphatase 134 (*)    All other components within normal limits  ETHANOL  BRAIN NATRIURETIC PEPTIDE  LIPASE, BLOOD  I-STAT TROPONIN, ED    EKG EKG Interpretation  Date/Time:  Saturday June 15 2018 03:09:24 EST Ventricular Rate:  75 PR Interval:    QRS Duration: 100 QT Interval:  397 QTC Calculation: 444 R Axis:   80 Text Interpretation:  Sinus rhythm Nonspecific T abnormalities, inferior leads No significant change since last tracing Confirmed by Zadie Rhine ([id number]) on 06/15/2018 3:12:20 AM   Radiology Dg Chest Port 1 View  Result Date: 06/15/2018 CLINICAL DATA:  Chest pain after eating crab C. EXAM: PORTABLE CHEST 1 VIEW COMPARISON:  Chest radiograph April 12, 2018 FINDINGS: Cardiac silhouette is mildly enlarged and unchanged. Coronary artery stent. Mediastinal silhouette is unremarkable. Pulmonary vascular congestion interstitial prominence somewhat confluent in lung bases. No pleural effusion or focal consolidation. No pneumothorax. Soft tissue planes and included osseous structures are non suspicious. IMPRESSION: Mild cardiomegaly. Interstitial prominence concerning for pulmonary edema. Electronically Signed   By: Pernell Dupre  Bloomer M.D.   On: 06/15/2018 03:32     Procedures Procedures   Medications Ordered in ED Medications  potassium chloride SA (K-DUR,KLOR-CON) CR tablet 40 mEq (40 mEq Oral Given 06/15/18 0428)  potassium chloride 10 mEq in 100 mL IVPB (0 mEq Intravenous Stopped 06/15/18 0620)  magnesium sulfate IVPB 2 g 50 mL (0 g Intravenous Stopped 06/15/18 0620)  fentaNYL (SUBLIMAZE) injection 50 mcg (50 mcg Intravenous Given 06/15/18 0520)     Initial Impression / Assessment and Plan / ED Course  I have reviewed the triage vital signs and the nursing notes.  Pertinent labs & imaging results that were available during my care of the patient were reviewed by me and considered in my medical decision making (see chart for details).     She became upset after reported she may have an allergy to shellfish.  She reports she had crab legs and began having chest pain.  Initial EKG is unremarkable. She does have evidence of CHF exacerbation as well as hypokalemia.  Patient will need to be admitted 7:34 AM Patient reports pain is improving.  She did have some abdominal tenderness, therefore added on LFTs and lipase.  Patient is now improved.  She would need to be admitted for chest pain work-up, as well as replacement of potassium. If She has any more abdominal pain she may need ultrasound imaging. Discussed with Dr. Julian Reil for admission  Final Clinical Impressions(s) / ED Diagnoses   Final diagnoses:  Chest pain, rule out acute myocardial infarction  Hypokalemia  Acute pulmonary edema [facility])    ED Discharge Orders    None       Zadie Rhine, MD 06/15/18 [phone]

## 2018-06-15 NOTE — ED Triage Notes (Signed)
Pt comes via [facility] from home for central CP after eating crab legs, hx of MI with stent placement, central CP, took two expired nitro without pain relief. Some SOB, diaphoretic upon EMS arrival

## 2018-06-15 NOTE — Consult Note (Signed)
[facility] Consult Note   Primary Physician:  Julieanne Manson Primary Cardiologist:   Lance Muss  Reason for Consultation:  Atypical chest pain  HPI:    [name] [name] is seen today for evaluation of constant chest pain at the request of Dr. Gery Pray.  The patient is a 52 year old female with a past medical history significant for coronary artery disease, bipolar disorder, diabetes mellitus, gastroparesis, esophageal ulcer/stenosis, hyperlipidemia, hypertension, and blindness in the left eye who presents with complaints of constant central chest/abdominal pain since last night.  Patient states that she ate some crab legs yesterday and then developed epigastric discomfort.  The pain was localized to the center of the chest and upper abdomen with worsening intensity on deep inspiration.  There was no radiation of the pain.  She also admitted to some nausea.  She has had diarrhea for the past 1 week.  There are no fevers or chills.  She still smokes 1 pack every 2 days.  She has a history of an inferior MI in 2010 (had DES placed to the mid and distal RCA).  She also had an NSTEMI in 2014 and had a stent placed in the left circumflex artery.  Her echocardiogram in 2016 revealed an ejection fraction of 50 to 55%.   Previous cardiac work-up Cardiac catheterization - 12/19/2012 IMPRESSIONS: 1. No significant disease in the left anterior descending artery and its branches. 2. 90% lesion in the mid left circumflex artery.  This appears to be a ruptured plaque and was successfully stented with a 2.75 x 23 drug-eluting stent, postdilated to 3.3 mm in diameter. 3. Occluded mid right coronary artery, which appears chronic.  The entire distal stented segment appears occluded.  The distal RCA system has faint collaterals from the left system. 4. Mildly decreased left ventricular systolic function.  LVEDP 30 mmHg.  Ejection fraction 40-45 %     Home Medications Prior to Admission  medications   Medication Sig Start Date End Date Taking? Authorizing Provider  acetaminophen (TYLENOL) 500 MG tablet Take 500-1,000 mg by mouth every 6 (six) hours as needed for moderate pain.    Yes [provider]  albuterol (PROVENTIL HFA;VENTOLIN HFA) 108 (90 Base) MCG/ACT inhaler Inhale 2 puffs into the lungs every 6 (six) hours as needed for wheezing or shortness of breath. 12/01/16  Yes Julieanne Manson, MD  clopidogrel (PLAVIX) 75 MG tablet Take 1 tablet (75 mg total) by mouth daily. 11/27/17  Yes Corky Crafts, MD  cyclobenzaprine (FLEXERIL) 10 MG tablet Take 1-2 tablets by mouth at bedtime as needed. Muscle spasms 09/21/16  Yes [provider]  ferrous sulfate 325 (65 FE) MG tablet Take 1 tablet (325 mg total) by mouth daily with breakfast. 01/25/16  Yes Julieanne Manson, MD  folic acid (FOLVITE) 1 MG tablet Take 1 mg by mouth daily.   Yes [provider]  furosemide (LASIX) 40 MG tablet TAKE 1 TABLET BY MOUTH EVERY MORNING Patient taking differently: Take 40 mg by mouth daily.  07/16/17  Yes Julieanne Manson, MD  insulin aspart (NOVOLOG) 100 UNIT/ML injection Inject 4-16 Units into the skin 3 (three) times daily with meals. Sliding scale CBG 100-150; 4 units, 151-200; 6 units, 201-250; 8 units, 251-300; 10 units, 301-350; 12 units, 351-400; 14 units, > 401; call doctor   Yes [provider]  LANTUS SOLOSTAR 100 UNIT/ML Solostar Pen Inject 10 Units into the skin 2 (two) times daily.  01/15/18  Yes [provider]  methotrexate (RHEUMATREX) 2.5 MG tablet 3 tabs by mouth twice weekly Patient taking differently: Take 7.5 mg by mouth 2 (two) times a week. On Wednesday and Thursday 04/09/18  Yes Julieanne Manson, MD  metoCLOPramide (REGLAN) 10 MG tablet Take 0.5 tablets (5 mg total) by mouth 4 (four) times daily -  before meals and at bedtime. Patient taking differently: Take 10 mg by mouth 4 (four) times daily -  before meals and at  bedtime.  08/03/15  Yes Julieanne Manson, MD  metoprolol tartrate (LOPRESSOR) 25 MG tablet Take 1 tablet (25 mg total) by mouth 2 (two) times daily. 08/29/17  Yes Julieanne Manson, MD  metroNIDAZOLE (FLAGYL) 500 MG tablet Take 500 mg by mouth 3 (three) times daily.   Yes [provider]  nitroGLYCERIN (NITROSTAT) 0.4 MG SL tablet Place 1 tablet (0.4 mg total) under the tongue every 5 (five) minutes as needed for chest pain. 05/29/17  Yes Corky Crafts, MD  ondansetron (ZOFRAN) 4 MG tablet Take 1 tablet (4 mg total) by mouth every 6 (six) hours. 04/13/18  Yes Elpidio Anis, PA-C  oxyCODONE-acetaminophen (PERCOCET/ROXICET) 5-325 MG tablet Take 1-2 tablets by mouth every 6 (six) hours as needed for severe pain. 04/13/18  Yes Upstill, Melvenia Beam, PA-C  pantoprazole (PROTONIX) 40 MG tablet Take 1 tablet (40 mg total) by mouth 2 (two) times daily. 08/29/17  Yes Julieanne Manson, MD  prednisoLONE acetate (PRED FORTE) 1 % ophthalmic suspension Place 4-5 drops into the left eye daily. 04/22/17  Yes [provider]  PROCTOSOL HC 2.5 % rectal cream Place 1 application rectally 2 (two) times daily.  06/07/18  Yes [provider]  rosuvastatin (CRESTOR) 40 MG tablet Take 1 tablet (40 mg total) by mouth daily. 11/27/17  Yes Corky Crafts, MD  sertraline (ZOLOFT) 100 MG tablet 1 tab by mouth daily Patient taking differently: Take 100 mg by mouth daily.  06/10/18  Yes Julieanne Manson, MD  traMADol (ULTRAM) 50 MG tablet TAKE 1 TABLET EVERY 6 HOURS AS NEEDED FOR MODERATE PAIN Patient taking differently: Take 50 mg by mouth every 6 (six) hours as needed for moderate pain.  03/06/17  Yes Julieanne Manson, MD  Incontinence Supply Disposable (POISE ULTRA THINS) PADS Use 5 pads daily as needed for urinary stress incontinence 01/03/16   Julieanne Manson, MD    Past Medical History: Past Medical History:  Diagnosis Date  . Acute myocardial infarction of other lateral wall,  initial episode of care   . Acute myocardial infarction, unspecified site, initial episode of care   . Acute osteomyelitis   . Allergic rhinitis   . Anemia   . Anginal pain (HCC)    07/15/13- no chest pain in months"  . Anxiety   . Bipolar affective (HCC)   . CAD (coronary artery disease) 07/2011   s/p DES mid and distal RCA with 50% LAD  . Daily headache    not daily  . Depression    Bipolar disorder  . Diabetic gastroparesis associated with type 2 diabetes mellitus (HCC)   . Diabetic peripheral neuropathy associated with type 2 diabetes mellitus (HCC)   . Diabetic retinopathy   . Esophageal stenosis   . Esophageal ulcer   . Esophagitis   . Gastroparesis   . Genital herpes    Reportedly tested and documented by [facility]--rare occurrences  . GERD (gastroesophageal reflux disease)   . Heart murmur   . History of stomach ulcers   . Hyperlipidemia   . Hypertension   .  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 Migraines   . Orthopnoea   . Pneumonia 2012  . Polysubstance abuse (HCC)    Crack cocaine--none since 2008, MJ, ETOH:  clean of all since 2008  . Renal insufficiency   . Rheumatoid arthritis(714.0)   . Sebaceous cyst   . Stroke [facility]) 2011   denies residual on 12/19/2012.  "Years ago"  . Type II diabetes mellitus (HCC)    Previously uncontrolled for many years with multiple complications.  2017 controlled. 05/2016:  6.7%    Past Surgical History: Past Surgical History:  Procedure Laterality Date  . CORONARY ANGIOPLASTY WITH STENT PLACEMENT  01/20/2009   "2" (12/19/2012)  . CORONARY ANGIOPLASTY WITH STENT PLACEMENT  2012   "2" (12/19/2012)  . CORONARY ANGIOPLASTY WITH STENT PLACEMENT  12/19/2012   "2" (12/19/2012)  . ESOPHAGOGASTRODUODENOSCOPY N/A 02/26/2015   Procedure: ESOPHAGOGASTRODUODENOSCOPY (EGD);  Surgeon: Dorena Cookey, MD;  Location: [facility] ENDOSCOPY;  Service: Endoscopy;  Laterality: N/A;  . EYE SURGERY       Multiple surgeries of both eyes:  last laser was 09/08/2015 of right eye.  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 GAS INSERTION Left 07/16/2013   Procedure: INSERTION OF GAS;  Surgeon: Shade Flood, MD;  Location: [facility] OR;  Service: Ophthalmology;  Laterality: Left;  SF6  . GAS/FLUID EXCHANGE Left 07/30/2013   Procedure: GAS/FLUID EXCHANGE;  Surgeon: Shade Flood, MD;  Location: [facility] OR;  Service: Ophthalmology;  Laterality: Left;  . IRRIGATION AND DEBRIDEMENT SEBACEOUS CYST Right 03/2011   "pointer" (12/19/2012)  . LEFT HEART CATHETERIZATION WITH CORONARY ANGIOGRAM N/A 07/06/2011   Procedure: LEFT HEART CATHETERIZATION WITH CORONARY ANGIOGRAM;  Surgeon: Corky Crafts, MD;  Location: [facility] CATH LAB;  Service: Cardiovascular;  Laterality: N/A;  possible PCI  . LEFT HEART CATHETERIZATION WITH CORONARY ANGIOGRAM N/A 12/19/2012   Procedure: LEFT HEART CATHETERIZATION WITH CORONARY ANGIOGRAM;  Surgeon: Corky Crafts, MD;  Location: [facility] CATH LAB;  Service: Cardiovascular;  Laterality: N/A;  . MEMBRANE PEEL Left 07/16/2013   Procedure: MEMBRANE PEEL;  Surgeon: Shade Flood, MD;  Location: [facility] OR;  Service: Ophthalmology;  Laterality: Left;  . PARS PLANA VITRECTOMY Left 07/16/2013   Procedure: PARS PLANA VITRECTOMY WITH 23 GAUGE;  Surgeon: Shade Flood, MD;  Location: [facility] OR;  Service: Ophthalmology;  Laterality: Left;  . PARS PLANA VITRECTOMY Left 07/30/2013   Procedure: PARS PLANA VITRECTOMY WITH 23 GAUGE WITH ENDOLASER;  Surgeon: Shade Flood, MD;  Location: [facility] OR;  Service: Ophthalmology;  Laterality: Left;  with endolaser  . PERCUTANEOUS CORONARY STENT INTERVENTION (PCI-S) N/A 07/06/2011   Procedure: PERCUTANEOUS CORONARY STENT INTERVENTION (PCI-S);  Surgeon: Corky Crafts, MD;  Location: [facility] CATH LAB;  Service: Cardiovascular;  Laterality: N/A;  . PHOTOCOAGULATION WITH LASER Left 07/16/2013   Procedure: PHOTOCOAGULATION WITH LASER;  Surgeon: Shade Flood, MD;  Location:  [facility] OR;  Service: Ophthalmology;  Laterality: Left;  ENDOLASER    Family History: Family History  Problem Relation Age of Onset  . Breast cancer Mother 20  . Hypertension Father   . Stomach cancer Sister 75       cause of death  . Hypertension Sister   . Lung cancer Sister 28       Cause of death  . Cancer Sister 70       ? stomach cancer  . Colon cancer Neg Hx     Social History: Social History  Socioeconomic History  . Marital status: Divorced    Spouse name: Marcial Pacas  . Number of children: 1  . Years of education: 80  . Highest education level: [employer] graduate  Occupational History  . Occupation: Disabled   Social Needs  . Financial resource strain: Not on file  . Food insecurity:    Worry: Not on file    Inability: Not on file  . Transportation needs:    Medical: Not on file    Non-medical: Not on file  Tobacco Use  . Smoking status: Current Every Day Smoker    Packs/day: 0.33    Years: 22.00    Pack years: 7.26    Types: Cigarettes  . Smokeless tobacco: Never Used  . Tobacco comment: Has been to classes--not sure if she wants to quit.  Changing to non menthol..  Chantix, patches, gum never helped.  9/19:  lot going on.  Substance and Sexual Activity  . Alcohol use: Yes    Alcohol/week: 0.0 standard drinks    Comment: socially--holidays  . Drug use: No    Types: "Crack" cocaine, Marijuana    Comment: 12/31/2012 " clean from crack and marijuana since 2008"  . Sexual activity: Not Currently    Partners: Male    Birth control/protection: None  Lifestyle  . Physical activity:    Days per week: Not on file    Minutes per session: Not on file  . Stress: Not on file  Relationships  . Social connections:    Talks on phone: Not on file    Gets together: Not on file    Attends religious service: Not on file    Active member of club or organization: Not on file    Attends meetings of clubs or organizations: Not on file    Relationship status: Not on file    Other Topics Concern  . Not on file  Social History Narrative   Has lived in [place] for most of life   Disabled   Previously went to [employer] school and worked as Conservation officer, nature.   Lives by herself near [place].   Divorced in 2015.     Allergies:  Allergies  Allergen Reactions  . Aspirin Other (See Comments)    Stomach bleeds Stomach bleeds GI bleeding Other reaction(s): Other (See Comments) GI bleeding Stomach bleeds      Review of Systems: [y] = yes, [ ]  = no   . General: Weight gain [ ] ; Weight loss [ ] ; Anorexia [ ] ; Fatigue [ ] ; Fever [ ] ; Chills [ ] ; Weakness [ ]   . Cardiac: Chest pain/pressure Cove.Etienne ]; Resting SOB [y]; Exertional SOB [ ] ; Orthopnea [ ] ; Pedal Edema [ ] ; Palpitations [ ] ; Syncope [ ] ; Presyncope [ ] ; Paroxysmal nocturnal dyspnea[ ]   . Pulmonary: Cough [ ] ; Wheezing[ ] ; Hemoptysis[ ] ; Sputum [ ] ; Snoring [ ]   . GI: Vomiting[y ]; Dysphagia[ ] ; Melena[ ] ; Hematochezia [ ] ; Heartburn[ y]; Abdominal pain Cove.Etienne ]; Constipation [ ] ; Diarrhea Cove.Etienne ]; BRBPR [ ]   . GU: Hematuria[ ] ; Dysuria [ ] ; Nocturia[ ]   . Vascular: Pain in legs with walking [ ] ; Pain in feet with lying flat [ ] ; Non-healing sores [ ] ; Stroke [ ] ; TIA [ ] ; Slurred speech [ ] ;  . Neuro: Headaches[ ] ; Vertigo[ ] ; Seizures[ ] ; Paresthesias[ ] ;Blurred vision [ ] ; Diplopia [ ] ; Vision changes [ ]   . Ortho/Skin: Arthritis [ ] ; Joint pain [ ] ; Muscle pain [ ] ; Joint swelling [ ] ;  Back Pain [ ] ; Rash [ ]   . Psych: Depression[ ] ; Anxiety[ ]   . Heme: Bleeding problems [ ] ; Clotting disorders [ ] ; Anemia [ ]   . Endocrine: Diabetes [ ] ; Thyroid dysfunction[ ]      Objective:    Vital Signs:   Temp:  [98 F (36.7 C)] 98 F (36.7 C) (11/16 0308) Pulse Rate:  [74-97] 97 (11/16 1000) Resp:  [18-26] 21 (11/16 1000) BP: (103-136)/(52-93) 136/67 (11/16 0730) SpO2:  [90 %-100 %] 90 % (11/16 1000) Last BM Date: 06/15/18  Weight change: There were no vitals filed for this  visit.  Intake/Output:   Intake/Output Summary (Last 24 hours) at 06/15/2018 1831 Last data filed at 06/15/2018 1447 Gross per 24 hour  Intake 266.66 ml  Output 900 ml  Net -633.34 ml      Physical Exam    General:  Well appearing. No resp difficulty, drowsy but easily arousable HEENT: normal Neck: supple. JVP . Carotids 2+ bilat; no bruits. No lymphadenopathy or thyromegaly appreciated. Cor: PMI nondisplaced. Regular rate & rhythm. No rubs, gallops or murmurs.  There is reproducible tenderness over the epigastric area Lungs: clear to auscultation bilaterally Abdomen: soft, nontender, nondistended. No hepatosplenomegaly. No bruits or masses. Good bowel sounds. Extremities: no cyanosis, clubbing, rash, edema Neuro: alert & orientedx3, cranial nerves grossly intact. moves all 4 extremities w/o difficulty.  Affect pleasant     Labs   Basic Metabolic Panel: Recent Labs  Lab 06/15/18 0312  NA 136  K 2.5*  CL 105  CO2 20*  GLUCOSE 129*  BUN 18  CREATININE 1.45*  CALCIUM 9.4    Liver Function Tests: Recent Labs  Lab 06/15/18 0312  AST 45*  ALT 17  ALKPHOS 134*  BILITOT 0.6  PROT 7.4  ALBUMIN 3.6   Recent Labs  Lab 06/15/18 0312  LIPASE 30   No results for input(s): AMMONIA in the last 168 hours.  CBC: Recent Labs  Lab 06/15/18 0312  WBC 9.9  NEUTROABS 7.6  HGB 11.8*  HCT 37.6  MCV 95.2  PLT 379    Cardiac Enzymes: Recent Labs  Lab 06/15/18 1706  TROPONINI <0.03    BNP: BNP (last 3 results) Recent Labs    06/15/18 0312  BNP 77.4    ProBNP (last 3 results) No results for input(s): PROBNP in the last 8760 hours.   CBG: Recent Labs  Lab 06/15/18 1225 06/15/18 1651  GLUCAP 233* 312*    Coagulation Studies: No results for input(s): LABPROT, INR in the last 72 hours.   Imaging   Dg Chest Port 1 View  Result Date: 06/15/2018 CLINICAL DATA:  Chest pain after eating crab C. EXAM: PORTABLE CHEST 1 VIEW COMPARISON:  Chest  radiograph April 12, 2018 FINDINGS: Cardiac silhouette is mildly enlarged and unchanged. Coronary artery stent. Mediastinal silhouette is unremarkable. Pulmonary vascular congestion interstitial prominence somewhat confluent in lung bases. No pleural effusion or focal consolidation. No pneumothorax. Soft tissue planes and included osseous structures are non suspicious. IMPRESSION: Mild cardiomegaly. Interstitial prominence concerning for pulmonary edema. Electronically Signed   By: Awilda Metro M.D.   On: 06/15/2018 03:32      Medications:     Current Medications: . clopidogrel  75 mg Oral Daily  . enoxaparin (LOVENOX) injection  40 mg Subcutaneous Daily  . [START ON 06/16/2018] ferrous sulfate  325 mg Oral Q breakfast  . folic acid  1 mg Oral Daily  . furosemide  20 mg Intravenous BID  . insulin  aspart  0-15 Units Subcutaneous TID WC  . insulin aspart  0-5 Units Subcutaneous QHS  . insulin glargine  10 Units Subcutaneous BID  . metoCLOPramide  10 mg Oral TID AC & HS  . metoprolol tartrate  25 mg Oral BID  .  morphine injection  2 mg Intravenous Once  . nicotine  14 mg Transdermal Daily  . nitroGLYCERIN  1 inch Topical Once  . pantoprazole  40 mg Oral BID  . prednisoLONE acetate  1 drop Left Eye QID  . rosuvastatin  40 mg Oral q1800  . sertraline  100 mg Oral Daily     Infusions:     Assessment/Plan   1. Constant chest pain  The patient presents with mostly GI symptoms.  She also has pain localized to the center of the chest with no radiation.  It has been with her constantly since last night.  Her ECG does not reveal any dynamic ST-T wave changes.  Her initial troponin is negative.  She does have a history of known coronary artery disease.  -Cycle cardiac biomarkers and obtain serial ECGs -Continue clopidogrel since the patient has a stated allergy to aspirin -Would hold off on starting IV heparin since her initial troponin is negative -Continue the metoprolol at  25 mg twice daily -Continue rosuvastatin at 40 mg nightly -Check a lipid panel in the morning -Would avoid any invasive angiography given her elevated serum creatinine on this admission (however we will continue to reassess her need for further cardiac work-up if her status changes).    Lonie Peak, MD  06/15/2018, 6:31 PM  Cardiology Overnight Team Please contact [facility] for night-coverage after hours (4p -7a ) and weekends on [email or web address]

## 2018-06-16 ENCOUNTER — Observation Stay (HOSPITAL_BASED_OUTPATIENT_CLINIC_OR_DEPARTMENT_OTHER): Payer: Medicaid Other

## 2018-06-16 DIAGNOSIS — H5440 Blindness, one eye, unspecified eye: Secondary | ICD-10-CM | POA: Diagnosis not present

## 2018-06-16 DIAGNOSIS — I361 Nonrheumatic tricuspid (valve) insufficiency: Secondary | ICD-10-CM

## 2018-06-16 DIAGNOSIS — I25118 Atherosclerotic heart disease of native coronary artery with other forms of angina pectoris: Secondary | ICD-10-CM

## 2018-06-16 DIAGNOSIS — R079 Chest pain, unspecified: Secondary | ICD-10-CM | POA: Diagnosis not present

## 2018-06-16 DIAGNOSIS — I10 Essential (primary) hypertension: Secondary | ICD-10-CM

## 2018-06-16 DIAGNOSIS — M059 Rheumatoid arthritis with rheumatoid factor, unspecified: Secondary | ICD-10-CM

## 2018-06-16 DIAGNOSIS — E118 Type 2 diabetes mellitus with unspecified complications: Secondary | ICD-10-CM

## 2018-06-16 DIAGNOSIS — I370 Nonrheumatic pulmonary valve stenosis: Secondary | ICD-10-CM | POA: Diagnosis not present

## 2018-06-16 LAB — BASIC METABOLIC PANEL
Anion gap: 9 (ref 5–15)
BUN: 15 mg/dL (ref 6–20)
CO2: 20 mmol/L — ABNORMAL LOW (ref 22–32)
Calcium: 8.7 mg/dL — ABNORMAL LOW (ref 8.9–10.3)
Chloride: 107 mmol/L (ref 98–111)
Creatinine, Ser: 1.2 mg/dL — ABNORMAL HIGH (ref 0.44–1.00)
GFR calc Af Amer: 60 mL/min — ABNORMAL LOW (ref >60)
GFR calc non Af Amer: 51 mL/min — ABNORMAL LOW (ref >60)
Glucose, Bld: 259 mg/dL — ABNORMAL HIGH (ref 70–99)
Potassium: 3.8 mmol/L (ref 3.5–5.1)
Sodium: 136 mmol/L (ref 135–145)

## 2018-06-16 LAB — GLUCOSE, CAPILLARY
GLUCOSE-CAPILLARY: 246 mg/dL — AB (ref 70–99)
Glucose-Capillary: 198 mg/dL — ABNORMAL HIGH (ref 70–99)
Glucose-Capillary: 287 mg/dL — ABNORMAL HIGH (ref 70–99)

## 2018-06-16 LAB — LIPID PANEL
Cholesterol: 114 mg/dL (ref 0–200)
HDL: 54 mg/dL (ref >40)
LDL Cholesterol: 46 mg/dL (ref 0–99)
Total CHOL/HDL Ratio: 2.1 RATIO
Triglycerides: 72 mg/dL (ref <150)
VLDL: 14 mg/dL (ref 0–40)

## 2018-06-16 LAB — ECHOCARDIOGRAM COMPLETE
Height: 60 in
Weight: 2691.2 oz

## 2018-06-16 LAB — MAGNESIUM: Magnesium: 2.7 mg/dL — ABNORMAL HIGH (ref 1.7–2.4)

## 2018-06-16 LAB — TROPONIN I: Troponin I: <0.03 ng/mL (ref <0.03)

## 2018-06-16 LAB — HEMOGLOBIN A1C
HEMOGLOBIN A1C: 6.7 % — AB (ref 4.8–5.6)
Mean Plasma Glucose: 145.59 mg/dL

## 2018-06-16 MED ORDER — PANTOPRAZOLE SODIUM 40 MG PO TBEC: 40.0000 mg | DELAYED_RELEASE_TABLET | Freq: Every day | ORAL | 1 refills | Status: DC

## 2018-06-16 MED ORDER — PANTOPRAZOLE SODIUM 40 MG PO TBEC: DELAYED_RELEASE_TABLET | ORAL | 0 refills | Status: DC

## 2018-06-16 NOTE — Discharge Instructions (Signed)
Esophagitis Esophagitis is inflammation of the esophagus. The esophagus is the tube that carries food and liquids from your mouth to your stomach. Esophagitis can cause soreness or pain in the esophagus. This condition can make it difficult and painful to swallow. What are the causes? Most causes of esophagitis are not serious. Common causes of this condition include:  Gastroesophageal reflux disease (GERD). This is when stomach contents move back up into the esophagus (reflux).  Repeated vomiting.  An allergic-type reaction, especially caused by food allergies (eosinophilic esophagitis).  Injury to the esophagus by swallowing large pills with or without water, or swallowing certain types of medicines.  Swallowing (ingesting) harmful chemicals, such as household cleaning products.  Heavy alcohol use.  An infection of the esophagus.This most often occurs in people who have a weakened immune system.  Radiation or chemotherapy treatment for cancer.  Certain diseases such as sarcoidosis, Crohn disease, and scleroderma.  What are the signs or symptoms? Symptoms of this condition include:  Difficult or painful swallowing.  Pain with swallowing acidic liquids, such as citrus juices.  Pain with burping.  Chest pain.  Difficulty breathing.  Nausea.  Vomiting.  Pain in the abdomen.  Weight loss.  Ulcers in the mouth.  Patches of white material in the mouth (candidiasis).  Fever.  Coughing up blood or vomiting blood.  Stool that is black, tarry, or bright red.  How is this diagnosed? Your health care provider will take a medical history and perform a physical exam. You may also have other tests, including:  An endoscopy to examine your stomach and esophagus with a small camera.  A test that measures the acidity level in your esophagus.  A test that measures how much pressure is on your esophagus.  A barium swallow or modified barium swallow to show the shape,  size, and functioning of your esophagus.  Allergy tests.  How is this treated? Treatment for this condition depends on the cause of your esophagitis. In some cases, steroids or other medicines may be given to help relieve your symptoms or to treat the underlying cause of your condition. You may have to make some lifestyle changes, such as:  Avoiding alcohol.  Quitting smoking.  Changing your diet.  Exercising.  Changing your sleep habits and your sleep environment.  Follow these instructions at home: Take these actions to decrease your discomfort and to help avoid complications. Diet  Follow a diet as recommended by your health care provider. This may involve avoiding foods and drinks such as: ? Coffee and tea (with or without caffeine). ? Drinks that contain alcohol. ? Energy drinks and sports drinks. ? Carbonated drinks or sodas. ? Chocolate and cocoa. ? Peppermint and mint flavorings. ? Garlic and onions. ? Horseradish. ? Spicy and acidic foods, including peppers, chili powder, curry powder, vinegar, hot sauces, and barbecue sauce. ? Citrus fruit juices and citrus fruits, such as oranges, lemons, and limes. ? Tomato-based foods, such as red sauce, chili, salsa, and pizza with red sauce. ? Fried and fatty foods, such as donuts, french fries, potato chips, and [place]-fat dressings. ? [place]-fat meats, such as hot dogs and fatty cuts of red and white meats, such as rib eye steak, sausage, ham, and bacon. ? [place]-fat dairy items, such as whole milk, butter, and cream cheese.  Eat small, frequent meals instead of large meals.  Avoid drinking large amounts of liquid with your meals.  Avoid eating meals during the 2-3 hours before bedtime.  Avoid lying down right  after you eat.  Do not exercise right after you eat.  Avoid foods and drinks that seem to make your symptoms worse. General instructions  Pay attention to any changes in your symptoms.  Take over-the-counter and  prescription medicines only as told by your health care provider. Do not take aspirin, ibuprofen, or other NSAIDs unless your health care provider told you to do so.  If you have trouble taking pills, use a pill splitter to decrease the size of the pill. This will decrease the chance of the pill getting stuck or injuring your esophagus on the way down. Also, drink water after you take a pill.  Do not use any tobacco products, including cigarettes, chewing tobacco, and e-cigarettes. If you need help quitting, ask your health care provider.  Wear loose-fitting clothing. Do not wear anything tight around your waist that causes pressure on your abdomen.  Raise (elevate) the head of your bed about 6 inches (15 cm).  Try to reduce your stress, such as with yoga or meditation. If you need help reducing stress, ask your health care provider.  If you are overweight, reduce your weight to an amount that is healthy for you. Ask your health care provider for guidance about a safe weight loss goal.  Keep all follow-up visits as told by your health care provider. This is important. Contact a health care provider if:  You have new symptoms.  You have unexplained weight loss.  You have difficulty swallowing, or it hurts to swallow.  You have wheezing or a persistent cough.  Your symptoms do not improve with treatment.  You have frequent heartburn for more than two weeks. Get help right away if:  You have severe pain in your arms, neck, jaw, teeth, or back.  You feel sweaty, dizzy, or light-headed.  You have chest pain or shortness of breath.  You vomit and your vomit looks like blood or coffee grounds.  Your stool is bloody or black.  You have a fever.  You cannot swallow, drink, or eat. This information is not intended to replace advice given to you by your health care provider. Make sure you discuss any questions you have with your health care provider. Document Released: 08/24/2004  Document Revised: 12/23/2015 Document Reviewed: 11/11/2014 Elsevier Interactive Patient Education  2018 [employer].    Pantoprazole tablets What is this medicine? PANTOPRAZOLE (pan TOE pra zole) prevents the production of acid in the stomach. It is used to treat gastroesophageal reflux disease (GERD), inflammation of the esophagus, and Zollinger-Ellison syndrome. This medicine may be used for other purposes; ask your health care provider or pharmacist if you have questions. COMMON BRAND NAME(S): Protonix What should I tell my health care provider before I take this medicine? They need to know if you have any of these conditions: -liver disease -low levels of magnesium in the blood -lupus -an unusual or allergic reaction to omeprazole, lansoprazole, pantoprazole, rabeprazole, other medicines, foods, dyes, or preservatives -pregnant or trying to get pregnant -breast-feeding How should I use this medicine? Take this medicine by mouth. Swallow the tablets whole with a drink of water. Follow the directions on the prescription label. Do not crush, break, or chew. Take your medicine at regular intervals. Do not take your medicine more often than directed. Talk to your pediatrician regarding the use of this medicine in children. While this drug may be prescribed for children as young as 5 years for selected conditions, precautions do apply. Overdosage: If you think you have  taken too much of this medicine contact a poison control center or emergency room at once. NOTE: This medicine is only for you. Do not share this medicine with others. What if I miss a dose? If you miss a dose, take it as soon as you can. If it is almost time for your next dose, take only that dose. Do not take double or extra doses. What may interact with this medicine? Do not take this medicine with any of the following medications: -atazanavir -nelfinavir This medicine may also interact with the following  medications: -ampicillin -delavirdine -erlotinib -iron salts -medicines for fungal infections like ketoconazole, itraconazole and voriconazole -methotrexate -mycophenolate mofetil -warfarin This list may not describe all possible interactions. Give your health care provider a list of all the medicines, herbs, non-prescription drugs, or dietary supplements you use. Also tell them if you smoke, drink alcohol, or use illegal drugs. Some items may interact with your medicine. What should I watch for while using this medicine? It can take several days before your stomach pain gets better. Check with your doctor or health care professional if your condition does not start to get better, or if it gets worse. You may need blood work done while you are taking this medicine. What side effects may I notice from receiving this medicine? Side effects that you should report to your doctor or health care professional as soon as possible: -allergic reactions like skin rash, itching or hives, swelling of the face, lips, or tongue -bone, muscle or joint pain -breathing problems -chest pain or chest tightness -dark yellow or brown urine -dizziness -fast, irregular heartbeat -feeling faint or lightheaded -fever or sore throat -muscle spasm -palpitations -rash on cheeks or arms that gets worse in the sun -redness, blistering, peeling or loosening of the skin, including inside the mouth -seizures -tremors -unusual bleeding or bruising -unusually weak or tired -yellowing of the eyes or skin Side effects that usually do not require medical attention (report to your doctor or health care professional if they continue or are bothersome): -constipation -diarrhea -dry mouth -headache -nausea This list may not describe all possible side effects. Call your doctor for medical advice about side effects. You may report side effects to FDA at 1-800-FDA-1088. Where should I keep my medicine? Keep out of the  reach of children. Store at room temperature between 15 and 30 degrees C (59 and 86 degrees F). Protect from light and moisture. Throw away any unused medicine after the expiration date. NOTE: This sheet is a summary. It may not cover all possible information. If you have questions about this medicine, talk to your doctor, pharmacist, or health care provider.  2018 Elsevier/Gold Standard (2015-08-19 12:20:19)

## 2018-06-16 NOTE — Progress Notes (Signed)
  Echocardiogram 2D Echocardiogram has been performed.  [name] [name] 06/16/2018, 10:48 AM

## 2018-06-16 NOTE — Progress Notes (Signed)
Progress Note  Patient Name: [name] [name] Date of Encounter: 06/16/2018  Primary Cardiologist: Eldridge Dace  Subjective   [name] [name] has reproducible epigastric pain   Inpatient Medications    Scheduled Meds: . clopidogrel  75 mg Oral Daily  . enoxaparin (LOVENOX) injection  40 mg Subcutaneous Daily  . ferrous sulfate  325 mg Oral Q breakfast  . folic acid  1 mg Oral Daily  . furosemide  20 mg Intravenous BID  . insulin aspart  0-15 Units Subcutaneous TID WC  . insulin aspart  0-5 Units Subcutaneous QHS  . insulin glargine  10 Units Subcutaneous BID  . iopamidol      . metoCLOPramide  10 mg Oral TID AC & HS  . metoprolol tartrate  25 mg Oral BID  .  morphine injection  2 mg Intravenous Once  . nicotine  14 mg Transdermal Daily  . nitroGLYCERIN  1 inch Topical Once  . pantoprazole  40 mg Oral BID  . prednisoLONE acetate  1 drop Left Eye QID  . rosuvastatin  40 mg Oral q1800  . sertraline  100 mg Oral Daily   Continuous Infusions:  PRN Meds: acetaminophen, albuterol, alum & mag hydroxide-simeth, ondansetron (ZOFRAN) IV, oxyCODONE-acetaminophen   Vital Signs    Vitals:   06/15/18 1000 06/15/18 2229 06/16/18 0208 06/16/18 0222  BP:  (!) 125/54  128/64  Pulse: 97 77  73  Resp: (!) 21 20  (!) 22  Temp:      TempSrc:      SpO2: 90% 100%    Weight:   76.3 kg   Height:   5' (1.524 m)     Intake/Output Summary (Last 24 hours) at 06/16/2018 0835 Last data filed at 06/16/2018 0600 Gross per 24 hour  Intake 600 ml  Output 1500 ml  Net -900 ml   Filed Weights   06/16/18 0208  Weight: 76.3 kg    Telemetry    NSR 06/16/2018  - Personally Reviewed  ECG    NSR no acute ST changes  - Personally Reviewed  Physical Exam  Obese black female  GEN: No acute distress.  Blind left eye Neck: No JVD Cardiac: RRR, no murmurs, rubs, or gallops.  Respiratory: Clear to auscultation bilaterally. GI: Soft, nontender, non-distended  MS: No edema; No deformity. Neuro:  Nonfocal    Psych: Normal affect   Labs    Chemistry Recent Labs  Lab 06/15/18 0312  NA 136  K 2.5*  CL 105  CO2 20*  GLUCOSE 129*  BUN 18  CREATININE 1.45*  CALCIUM 9.4  PROT 7.4  ALBUMIN 3.6  AST 45*  ALT 17  ALKPHOS 134*  BILITOT 0.6  GFRNONAA 41*  GFRAA 47*  ANIONGAP 11     Hematology Recent Labs  Lab 06/15/18 0312  WBC 9.9  RBC 3.95  HGB 11.8*  HCT 37.6  MCV 95.2  MCH 29.9  MCHC 31.4  RDW 18.6*  PLT 379    Cardiac Enzymes Recent Labs  Lab 06/15/18 1706 06/15/18 2253 06/16/18 0642  TROPONINI <0.03 <0.03 <0.03    Recent Labs  Lab 06/15/18 0337 06/15/18 0843  TROPIPOC 0.00 0.00     BNP Recent Labs  Lab 06/15/18 0312  BNP 77.4     DDimer No results for input(s): DDIMER in the last 168 hours.   Radiology    Ct Angio Chest Pe W Or Wo Contrast  Result Date: 06/15/2018 CLINICAL DATA:  Central chest pain. EXAM: CT ANGIOGRAPHY CHEST WITH  CONTRAST TECHNIQUE: Multidetector CT imaging of the chest was performed using the standard protocol during bolus administration of intravenous contrast. Multiplanar CT image reconstructions and MIPs were obtained to evaluate the vascular anatomy. CONTRAST:  ISOVUE-370 IOPAMIDOL (ISOVUE-370) INJECTION 76% COMPARISON:  CT abdomen 04/13/2018 FINDINGS: Cardiovascular: Satisfactory opacification of the pulmonary arteries to the segmental level. No evidence of pulmonary embolism. Top normal heart size. No pericardial effusion. Thoracic aorta is normal in caliber. Multi vessel coronary artery atherosclerosis. Mediastinum/Nodes: No enlarged mediastinal, hilar, or axillary lymph nodes. Thyroid gland and trachea demonstrate no significant findings. Distal esophageal wall thickening as can be seen with esophagitis. Lungs/Pleura: No pleural effusion or pneumothorax. Bilateral lower lobe airspace disease likely reflecting atelectasis. Mild lingular atelectasis. Upper Abdomen: No acute upper abdominal abnormality. Musculoskeletal: No  acute osseous abnormality. No aggressive osseous lesion. Review of the MIP images confirms the above findings. IMPRESSION: 1. No evidence of pulmonary embolus. 2. Distal esophageal wall thickening as can be seen with esophagitis. 3. Bilateral lower lobe airspace disease likely reflecting atelectasis. Electronically Signed   By: Elige Ko   On: 06/15/2018 22:26   Dg Chest Port 1 View  Result Date: 06/15/2018 CLINICAL DATA:  Chest pain after eating crab C. EXAM: PORTABLE CHEST 1 VIEW COMPARISON:  Chest radiograph April 12, 2018 FINDINGS: Cardiac silhouette is mildly enlarged and unchanged. Coronary artery stent. Mediastinal silhouette is unremarkable. Pulmonary vascular congestion interstitial prominence somewhat confluent in lung bases. No pleural effusion or focal consolidation. No pneumothorax. Soft tissue planes and included osseous structures are non suspicious. IMPRESSION: Mild cardiomegaly. Interstitial prominence concerning for pulmonary edema. Electronically Signed   By: Awilda Metro M.D.   On: 06/15/2018 03:32    Cardiac Studies   TTE pending   Patient Profile     The patient is a 52 year old female with a past medical history significant for coronary artery disease, bipolar disorder, diabetes mellitus, gastroparesis, esophageal ulcer/stenosis, hyperlipidemia, hypertension, and blindness in the left eye who presents with complaints of constant central chest/abdominal pain since last night.    Assessment & Plan    Epigastric Pain: this is clearly not angina reproducible to palpation constant with no troponin elevation or ECG Changes suggest GI w/u and consult  CAD:  History of IMI 2010 with DES to RCA and SSTEMI 2014 with circumflex stent. Previous EF TTE 50-55% In 2016.  Continue aspirin beta blocker and plavix No need for any further inpatient evaluaiton   [facility] will sign off.   Medication Recommendations:  GI consult EGD  Other recommendations (labs, testing,  etc):  None  Follow up as an outpatient:  [place]   For questions or updates, please contact [facility] Please consult [email or web address] for contact info under        Signed, Charlton Haws, MD  06/16/2018, 8:35 AM

## 2018-06-16 NOTE — Discharge Summary (Signed)
Physician Discharge Summary  [name] [name] HYI:[id number] DOB: 1965-12-13 DOA: 06/15/2018  PCP: Julieanne Manson, MD  Admit date: 06/15/2018 Discharge date: 06/16/2018  Admitted From: Home  Disposition:  Home   Recommendations for Outpatient Follow-up:  1. Follow up with PCP in 1-2 weeks 2. Please obtain BMP/CBC in one week your next doctors visit.  3. PPI BID AC for 30 daysm followed by daily. Follow up with outpatient GI. Cont PO flagyl as prescribe by outpatient GI.   Discharge Condition: Stable CODE STATUS: Full  Diet recommendation: Diabetic/Heart Healthy.   Brief/Interim Summary: 52 year old with history of CAD, diabetes mellitus type 2, essential hypertension, hyperlipidemia came to the hospital with complains of chest pain.  Patient stated started the day before she came to the hospital and thought it was secondary due to the food which was spicy.  Cardiology was consulted who recommended observing her.  Cardiac enzymes remain negative.  CT of the chest was negative for pulmonary embolism but showed esophageal thickening.  Patient has been on outpatient Flagyl which was started per her outpatient GI provider.  Her hemoglobin A1c is 6.5 and LDL was 46.  At the time of admission she was significantly hypokalemic but on the day of discharge it improved to 3.8 with repletion. Today she is cleared by cardiology and stable for discharge.  I have advised her to take PPI twice daily before meals for 30 days followed by daily and follow-up with your primary care provider and gastroenterology.   Discharge Diagnoses:  Principal Problem:   Chest pain Active Problems:   Diabetes mellitus with multiple complications (HCC)   HTN (hypertension)   Rheumatoid arthritis (HCC)   Coronary atherosclerosis   Diabetic gastroparesis (HCC)   Tobacco abuse   Hyperlipidemia   Chronic systolic congestive heart failure, NYHA class 1 (HCC)   Obesity (BMI 30-39.9)   Hypokalemia   Blind left eye   Bipolar  disease, chronic (HCC)   H/O esophageal ulcer  Atypical chest pain - Likely gastritis related.  Cardiac enzymes remain negative.  Continue PPI twice daily for 30 days followed by once a day.  There is concerns of esophagitis on the CTA of the chest but no pulmonary embolism was noted.  Hemoglobin A1c was 6.7.  BUN was 46.  Patient seen by cardiology who recommended no further work-up - Follow-up outpatient with gastroenterology and primary care provider.  Continue oral Flagyl that was prescribed by outpatient gastroenterology  Hypokalemia -Resolved with IV repletion.  Today's 3.8  Chronic systolic congestive heart failure -Appears to be stable.  Currently euvolemic in nature.  Coronary artery disease -Currently chest pain-free.  Resume home meds  Diabetes mellitus type 2 with gastroparesis -Resume home meds  Essential hypertension -Resume home meds  Hyperlipidemia -Resume home meds  Blind in left eye - Supportive care  SCDs Full Code  Stable for D/C  Discharge Instructions   Allergies as of 06/16/2018      Reactions   Aspirin Other (See Comments)   Stomach bleeds Stomach bleeds GI bleeding Other reaction(s): Other (See Comments) GI bleeding Stomach bleeds      Medication List    TAKE these medications   acetaminophen 500 MG tablet Commonly known as:  TYLENOL Take 500-1,000 mg by mouth every 6 (six) hours as needed for moderate pain.   albuterol 108 (90 Base) MCG/ACT inhaler Commonly known as:  PROVENTIL HFA;VENTOLIN HFA Inhale 2 puffs into the lungs every 6 (six) hours as needed for wheezing or shortness of breath.  clopidogrel 75 MG tablet Commonly known as:  PLAVIX Take 1 tablet (75 mg total) by mouth daily.   cyclobenzaprine 10 MG tablet Commonly known as:  FLEXERIL Take 1-2 tablets by mouth at bedtime as needed. Muscle spasms   ferrous sulfate 325 (65 FE) MG tablet Take 1 tablet (325 mg total) by mouth daily with breakfast.   folic acid 1 MG  tablet Commonly known as:  FOLVITE Take 1 mg by mouth daily.   furosemide 40 MG tablet Commonly known as:  LASIX TAKE 1 TABLET BY MOUTH EVERY MORNING What changed:  when to take this   insulin aspart 100 UNIT/ML injection Commonly known as:  novoLOG Inject 4-16 Units into the skin 3 (three) times daily with meals. Sliding scale CBG 100-150; 4 units, 151-200; 6 units, 201-250; 8 units, 251-300; 10 units, 301-350; 12 units, 351-400; 14 units, > 401; call doctor   LANTUS SOLOSTAR 100 UNIT/ML Solostar Pen Generic drug:  Insulin Glargine Inject 10 Units into the skin 2 (two) times daily.   methotrexate 2.5 MG tablet Commonly known as:  RHEUMATREX 3 tabs by mouth twice weekly What changed:    how much to take  how to take this  when to take this  additional instructions   metoCLOPramide 10 MG tablet Commonly known as:  REGLAN Take 0.5 tablets (5 mg total) by mouth 4 (four) times daily -  before meals and at bedtime. What changed:  how much to take   metoprolol tartrate 25 MG tablet Commonly known as:  LOPRESSOR Take 1 tablet (25 mg total) by mouth 2 (two) times daily.   metroNIDAZOLE 500 MG tablet Commonly known as:  FLAGYL Take 500 mg by mouth 3 (three) times daily.   nitroGLYCERIN 0.4 MG SL tablet Commonly known as:  NITROSTAT Place 1 tablet (0.4 mg total) under the tongue every 5 (five) minutes as needed for chest pain.   ondansetron 4 MG tablet Commonly known as:  ZOFRAN Take 1 tablet (4 mg total) by mouth every 6 (six) hours.   oxyCODONE-acetaminophen 5-325 MG tablet Commonly known as:  PERCOCET/ROXICET Take 1-2 tablets by mouth every 6 (six) hours as needed for severe pain.   pantoprazole 40 MG tablet Commonly known as:  PROTONIX Take 1 tablet (40 mg total) by mouth 2 (two) times daily before a meal for 30 days, THEN 1 tablet (40 mg total) daily before breakfast. Start taking on:  06/16/2018 What changed:  You were already taking a medication with the same  name, and this prescription was added. Make sure you understand how and when to take each.   pantoprazole 40 MG tablet Commonly known as:  PROTONIX Take 1 tablet (40 mg total) by mouth daily. What changed:  You were already taking a medication with the same name, and this prescription was added. Make sure you understand how and when to take each.   pantoprazole 40 MG tablet Commonly known as:  PROTONIX Take 1 tablet (40 mg total) by mouth 2 (two) times daily before a meal for 30 days, THEN 1 tablet (40 mg total) daily before breakfast. Start taking on:  06/16/2018 What changed:  See the new instructions.   POISE ULTRA THINS Pads Use 5 pads daily as needed for urinary stress incontinence   prednisoLONE acetate 1 % ophthalmic suspension Commonly known as:  PRED FORTE Place 4-5 drops into the left eye daily.   PROCTOSOL HC 2.5 % rectal cream Generic drug:  hydrocortisone Place 1 application rectally 2 (two)  times daily.   rosuvastatin 40 MG tablet Commonly known as:  CRESTOR Take 1 tablet (40 mg total) by mouth daily.   sertraline 100 MG tablet Commonly known as:  ZOLOFT 1 tab by mouth daily What changed:    how much to take  how to take this  when to take this  additional instructions   traMADol 50 MG tablet Commonly known as:  ULTRAM TAKE 1 TABLET EVERY 6 HOURS AS NEEDED FOR MODERATE PAIN What changed:  See the new instructions.      Follow-up Information    Julieanne Manson, MD. Schedule an appointment as soon as possible for a visit in 2 week(s).   Specialty:  Internal Medicine Contact information: [address] [place] [address] [phone]          Allergies  Allergen Reactions  . Aspirin Other (See Comments)    Stomach bleeds Stomach bleeds GI bleeding Other reaction(s): Other (See Comments) GI bleeding Stomach bleeds     You were cared for by a hospitalist during your hospital stay. If you have any questions about your discharge  medications or the care you received while you were in the hospital after you are discharged, you can call the unit and asked to speak with the hospitalist on call if the hospitalist that took care of you is not available. Once you are discharged, your primary care physician will handle any further medical issues. Please note that no refills for any discharge medications will be authorized once you are discharged, as it is imperative that you return to your primary care physician (or establish a relationship with a primary care physician if you do not have one) for your aftercare needs so that they can reassess your need for medications and monitor your lab values.  Consultations:  Cardiology   Procedures/Studies: Ct Angio Chest Pe W Or Wo Contrast  Result Date: 06/15/2018 CLINICAL DATA:  Central chest pain. EXAM: CT ANGIOGRAPHY CHEST WITH CONTRAST TECHNIQUE: Multidetector CT imaging of the chest was performed using the standard protocol during bolus administration of intravenous contrast. Multiplanar CT image reconstructions and MIPs were obtained to evaluate the vascular anatomy. CONTRAST:  ISOVUE-370 IOPAMIDOL (ISOVUE-370) INJECTION 76% COMPARISON:  CT abdomen 04/13/2018 FINDINGS: Cardiovascular: Satisfactory opacification of the pulmonary arteries to the segmental level. No evidence of pulmonary embolism. Top normal heart size. No pericardial effusion. Thoracic aorta is normal in caliber. Multi vessel coronary artery atherosclerosis. Mediastinum/Nodes: No enlarged mediastinal, hilar, or axillary lymph nodes. Thyroid gland and trachea demonstrate no significant findings. Distal esophageal wall thickening as can be seen with esophagitis. Lungs/Pleura: No pleural effusion or pneumothorax. Bilateral lower lobe airspace disease likely reflecting atelectasis. Mild lingular atelectasis. Upper Abdomen: No acute upper abdominal abnormality. Musculoskeletal: No acute osseous abnormality. No aggressive  osseous lesion. Review of the MIP images confirms the above findings. IMPRESSION: 1. No evidence of pulmonary embolus. 2. Distal esophageal wall thickening as can be seen with esophagitis. 3. Bilateral lower lobe airspace disease likely reflecting atelectasis. Electronically Signed   By: Elige Ko   On: 06/15/2018 22:26   Dg Chest Port 1 View  Result Date: 06/15/2018 CLINICAL DATA:  Chest pain after eating crab C. EXAM: PORTABLE CHEST 1 VIEW COMPARISON:  Chest radiograph April 12, 2018 FINDINGS: Cardiac silhouette is mildly enlarged and unchanged. Coronary artery stent. Mediastinal silhouette is unremarkable. Pulmonary vascular congestion interstitial prominence somewhat confluent in lung bases. No pleural effusion or focal consolidation. No pneumothorax. Soft tissue planes and included osseous  structures are non suspicious. IMPRESSION: Mild cardiomegaly. Interstitial prominence concerning for pulmonary edema. Electronically Signed   By: Awilda Metro M.D.   On: 06/15/2018 03:32      Subjective: No complaints, feels better   General = no fevers, chills, dizziness, malaise, fatigue HEENT/EYES = negative for pain, redness, loss of vision, double vision, blurred vision, loss of hearing, sore throat, hoarseness, dysphagia Cardiovascular= negative for chest pain, palpitation, murmurs, lower extremity swelling Respiratory/lungs= negative for shortness of breath, cough, hemoptysis, wheezing, mucus production Gastrointestinal= negative for nausea, vomiting,, abdominal pain, melena, hematemesis Genitourinary= negative for Dysuria, Hematuria, Change in Urinary Frequency MSK = Negative for arthralgia, myalgias, Back Pain, Joint swelling  Neurology= Negative for headache, seizures, numbness, tingling  Psychiatry= Negative for anxiety, depression, suicidal and homocidal ideation Allergy/Immunology= Medication/Food allergy as listed  Skin= Negative for Rash, lesions, ulcers,  itching    Discharge Exam: Vitals:   06/16/18 0222 06/16/18 0840  BP: 128/64 95/76  Pulse: 73 75  Resp: (!) 22 20  Temp:    SpO2:  98%   Vitals:   06/15/18 2229 06/16/18 0208 06/16/18 0222 06/16/18 0840  BP: (!) 125/54  128/64 95/76  Pulse: 77  73 75  Resp: 20  (!) 22 20  Temp:      TempSrc:      SpO2: 100%   98%  Weight:  76.3 kg    Height:  5' (1.524 m)      General: Pt is alert, awake, not in acute distress Cardiovascular: RRR, S1/S2 +, no rubs, no gallops Respiratory: CTA bilaterally, no wheezing, no rhonchi Abdominal: Soft, NT, ND, bowel sounds + Extremities: no edema, no cyanosis    The results of significant diagnostics from this hospitalization (including imaging, microbiology, ancillary and laboratory) are listed below for reference.     Microbiology: No results found for this or any previous visit (from the past 240 hour(s)).   Labs: BNP (last 3 results) Recent Labs    06/15/18 0312  BNP 77.4   Basic Metabolic Panel: Recent Labs  Lab 06/15/18 0312 06/16/18 0642  NA 136 136  K 2.5* 3.8  CL 105 107  CO2 20* 20*  GLUCOSE 129* 259*  BUN 18 15  CREATININE 1.45* 1.20*  CALCIUM 9.4 8.7*  MG  --  2.7*   Liver Function Tests: Recent Labs  Lab 06/15/18 0312  AST 45*  ALT 17  ALKPHOS 134*  BILITOT 0.6  PROT 7.4  ALBUMIN 3.6   Recent Labs  Lab 06/15/18 0312  LIPASE 30   No results for input(s): AMMONIA in the last 168 hours. CBC: Recent Labs  Lab 06/15/18 0312  WBC 9.9  NEUTROABS 7.6  HGB 11.8*  HCT 37.6  MCV 95.2  PLT 379   Cardiac Enzymes: Recent Labs  Lab 06/15/18 1706 06/15/18 2253 06/16/18 0642  TROPONINI <0.03 <0.03 <0.03   BNP: Invalid input(s): POCBNP CBG: Recent Labs  Lab 06/15/18 1225 06/15/18 1651 06/15/18 2101 06/16/18 0214 06/16/18 0818  GLUCAP 233* 312* 317* 198* 246*   D-Dimer No results for input(s): DDIMER in the last 72 hours. Hgb A1c Recent Labs    06/15/18 2253  HGBA1C 6.7*   Lipid  Profile Recent Labs    06/16/18 0642  CHOL 114  HDL 54  LDLCALC 46  TRIG 72  CHOLHDL 2.1   Thyroid function studies No results for input(s): TSH, T4TOTAL, T3FREE, THYROIDAB in the last 72 hours.  Invalid input(s): FREET3 Anemia work up No results for input(s):  VITAMINB12, FOLATE, FERRITIN, TIBC, IRON, RETICCTPCT in the last 72 hours. Urinalysis    Component Value Date/Time   COLORURINE YELLOW 04/12/2018 2340   APPEARANCEUR CLEAR 04/12/2018 2340   LABSPEC 1.021 04/12/2018 2340   PHURINE 5.0 04/12/2018 2340   GLUCOSEU 50 (A) 04/12/2018 2340   HGBUR NEGATIVE 04/12/2018 2340   HGBUR trace-intact 08/25/2010 0841   BILIRUBINUR NEGATIVE 04/12/2018 2340   KETONESUR 20 (A) 04/12/2018 2340   PROTEINUR 100 (A) 04/12/2018 2340   UROBILINOGEN 1.0 02/21/2015 0432   NITRITE NEGATIVE 04/12/2018 2340   LEUKOCYTESUR NEGATIVE 04/12/2018 2340   Sepsis Labs Invalid input(s): PROCALCITONIN,  WBC,  LACTICIDVEN Microbiology No results found for this or any previous visit (from the past 240 hour(s)).   Time coordinating discharge:  I have spent 35 minutes face to face with the patient and on the ward discussing the patients care, assessment, plan and disposition with other care givers. >50% of the time was devoted counseling the patient about the risks and benefits of treatment/Discharge disposition and coordinating care.   SIGNED:   Dimple Nanas, MD  [facility] 06/16/2018, 10:47 AM Pager   If 7PM-7AM, please contact night-coverage [email or web address] Password [email or web address]

## 2018-06-18 ENCOUNTER — Other Ambulatory Visit: Payer: Self-pay | Admitting: Interventional Cardiology

## 2018-06-18 DIAGNOSIS — I25119 Atherosclerotic heart disease of native coronary artery with unspecified angina pectoris: Secondary | ICD-10-CM

## 2018-06-18 DIAGNOSIS — E10621 Type 1 diabetes mellitus with foot ulcer: Secondary | ICD-10-CM | POA: Diagnosis not present

## 2018-06-21 ENCOUNTER — Other Ambulatory Visit: Payer: Medicaid Other | Admitting: Internal Medicine

## 2018-06-21 DIAGNOSIS — F439 Reaction to severe stress, unspecified: Secondary | ICD-10-CM

## 2018-06-21 NOTE — Progress Notes (Signed)
   THERAPY PROGRESS NOTE  Session Time: 60 minutes  Participation Level: Active  Behavioral Response: CasualAlertEuthymic  Type of Therapy: Individual Therapy  Treatment Goals addressed: Coping  Interventions: Motivational Interviewing and Supportive  Summary: [name] [name] is a 52 y.o. female who presents with a euthymic mood and appropriate affect. [name] [name] recounted her past week including an emergency room visit, a change in her doctor and nurse, and her struggles with her partner. She disclosed that her abandonment issues have been triggered due to her doctor retiring, her therapist leaving last month, and a new nurse coming into her home. She expressed her gratitude that the social work intern (SWI) will continue to meet with her weekly as she says that helps her to feel more grounded. [name] [name] revealed that she "tore off her patch" and increased her smoking this week as cigarettes give her a sense of stability because they are something on which she can always rely. She admitted that she knows that she should quit, but they give her relief and make her feel better in the short-term. She explained that her stress has been exacerbated by the peri-menopausal symptoms of hot flashes and night sweats. [name] [name] also explained that she has asked her boyfriend to move out because his gambling and substance are causing her anxiety. She disclosed that she thinks that she could take better care of herself without the stress of the relationship. [name] [name] said that expressing her anger and frustration in therapy sessions are providing her with relief and she again expressed her gratitude to the SWI. Marland Kitchen   Suicidal/Homicidal: Nowithout intent/plan  Therapist Response: The social work Tax inspector (SWI) used a Engineer, mining [name] [name]'s awareness about her abandonment triggers and her efforts to decrease unwanted stress in her life. SWI used motivational interviewing techniques of empathic reflection and open-ended  questions to elicit [name]'s observations concerning stress in her life and to help her to recognize the discrepancy between smoking and wanting to take better care of herself. SWI continued to build trust and to strengthen the therapeutic alliance in this 2nd therapy session with the client. [name] [name] and the SWI will continue to meet once a week.   Plan: Return again in 2 weeks.   Prentiss Bells, Student-Social Work 06/21/2018

## 2018-06-29 IMAGING — DX DG CHEST 2V
1 series · 2 of 2 positions shown · non-contrast
Comparison: 04/20/2016.

CLINICAL DATA: Left-sided lower chest pain and shortness of breath
after a fall, initial encounter.

EXAM:
CHEST - 2 VIEW

[Series 2: chest lat · 0.14mm/px · 2 of 2 slices shown]
[im 1/2]
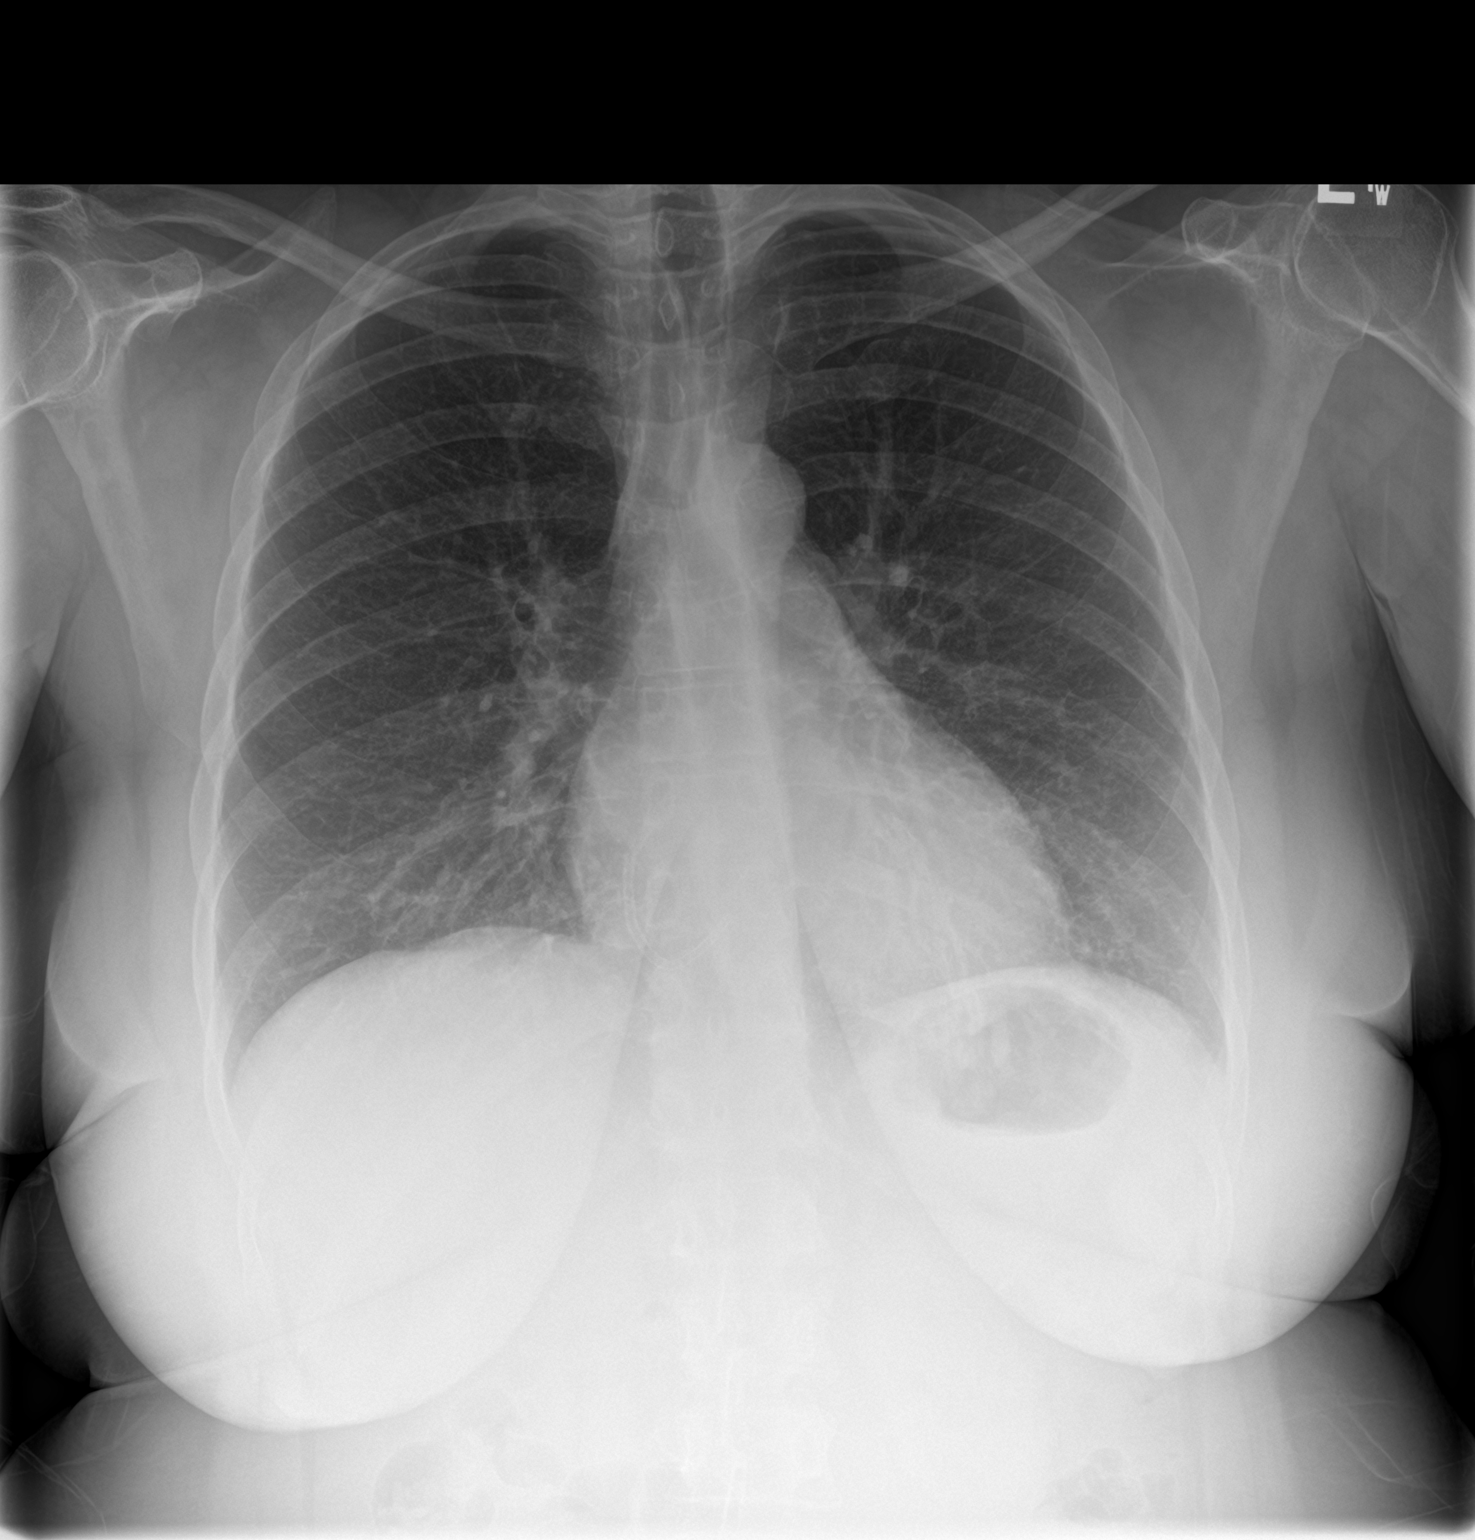
[im 2/2]
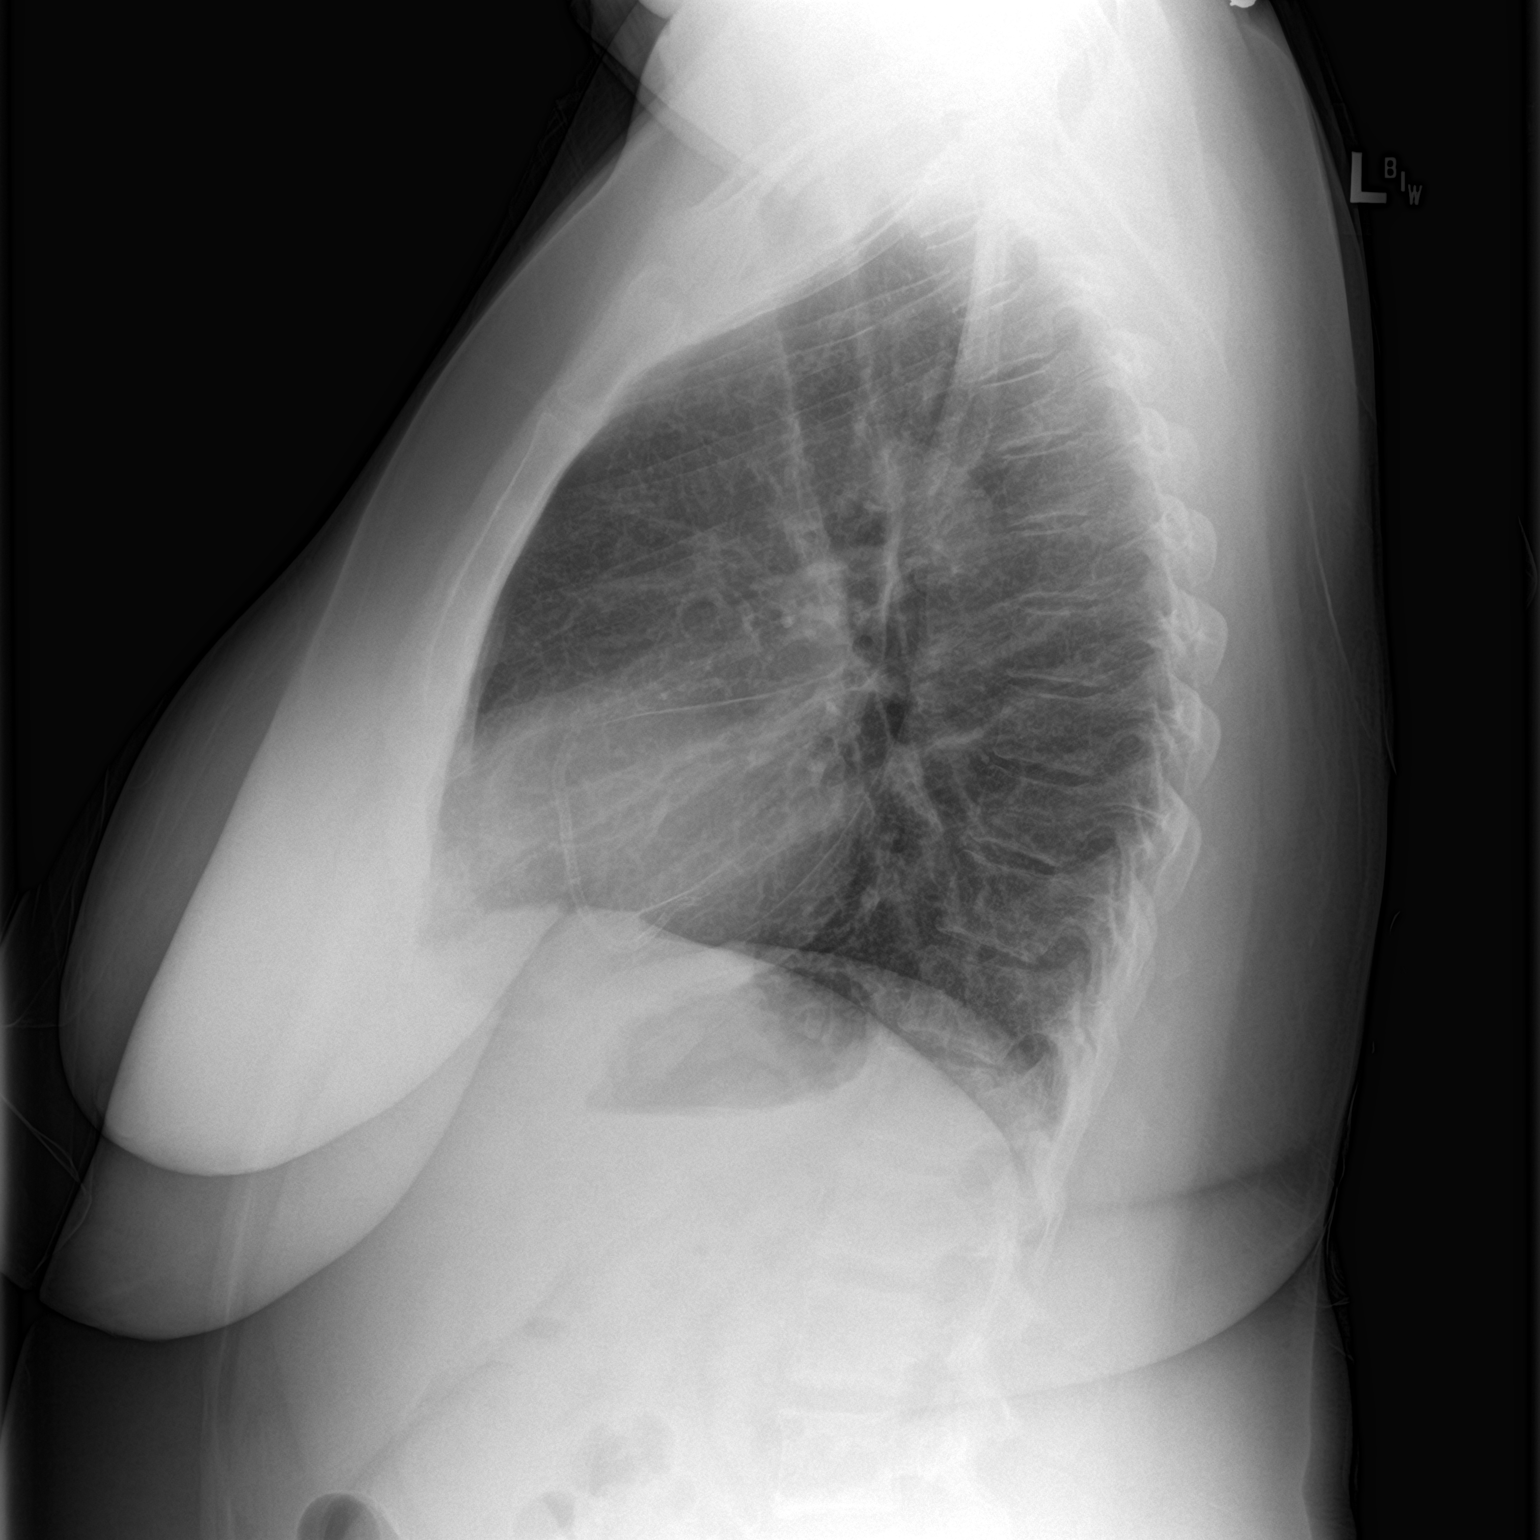

[2 of 2 positions shown; findings below may reference images not displayed]

FINDINGS: Trachea is midline. Heart size normal. Right coronary artery stent.
Lungs are clear. No pleural fluid.
IMPRESSION: No acute findings.

## 2018-07-01 IMAGING — CR DG RIBS W/ CHEST 3+V*L*
3 series · 3 of 3 positions shown · non-contrast
Comparison: 11/27/2017

CLINICAL DATA: Pt fell at her home 11/24/2017, pain now in Lt lower
anterior ribs, "bb" used to mark area of pain on the 2 rib images

EXAM:
LEFT RIBS AND CHEST - 3+ VIEW

[w chest pa]
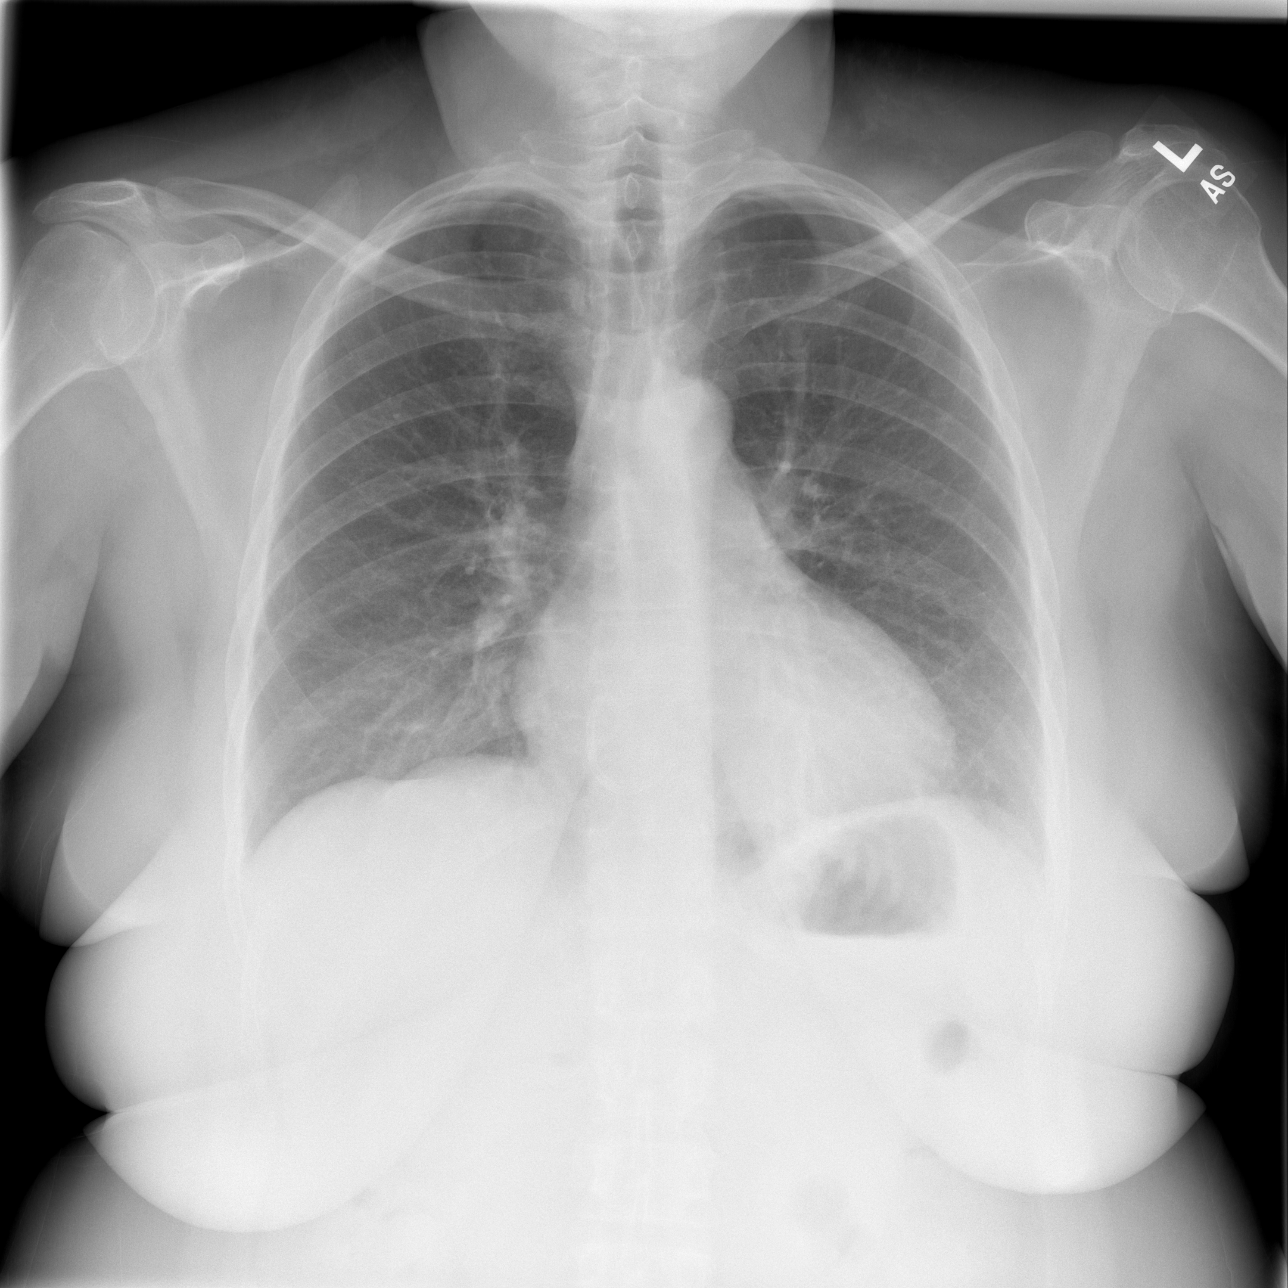

[w ribs ap/pa upper left *]
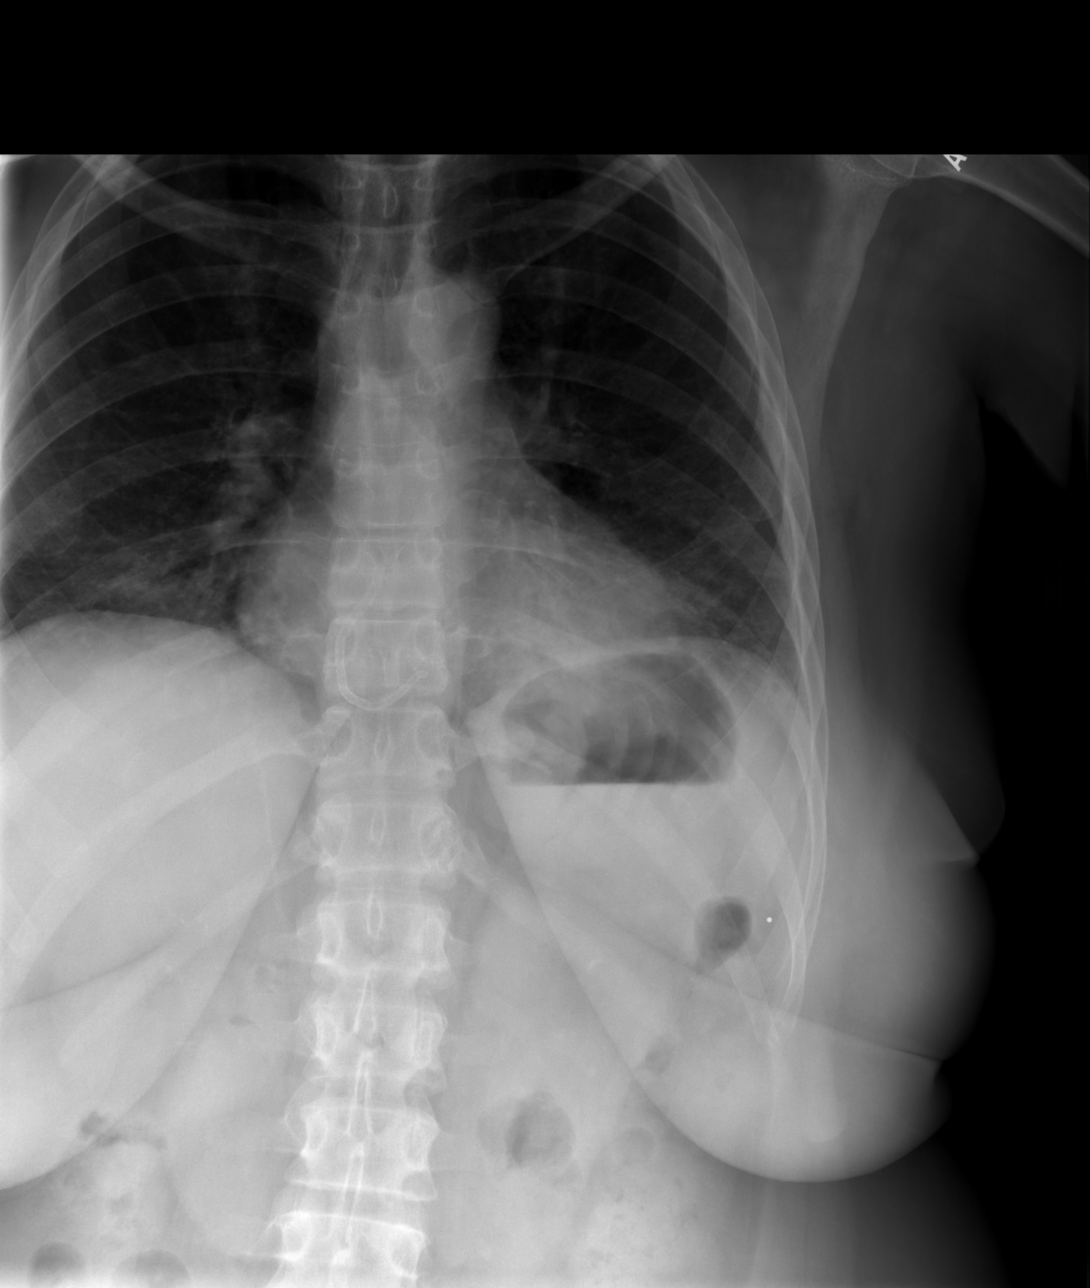

[w ribs oblique left *]
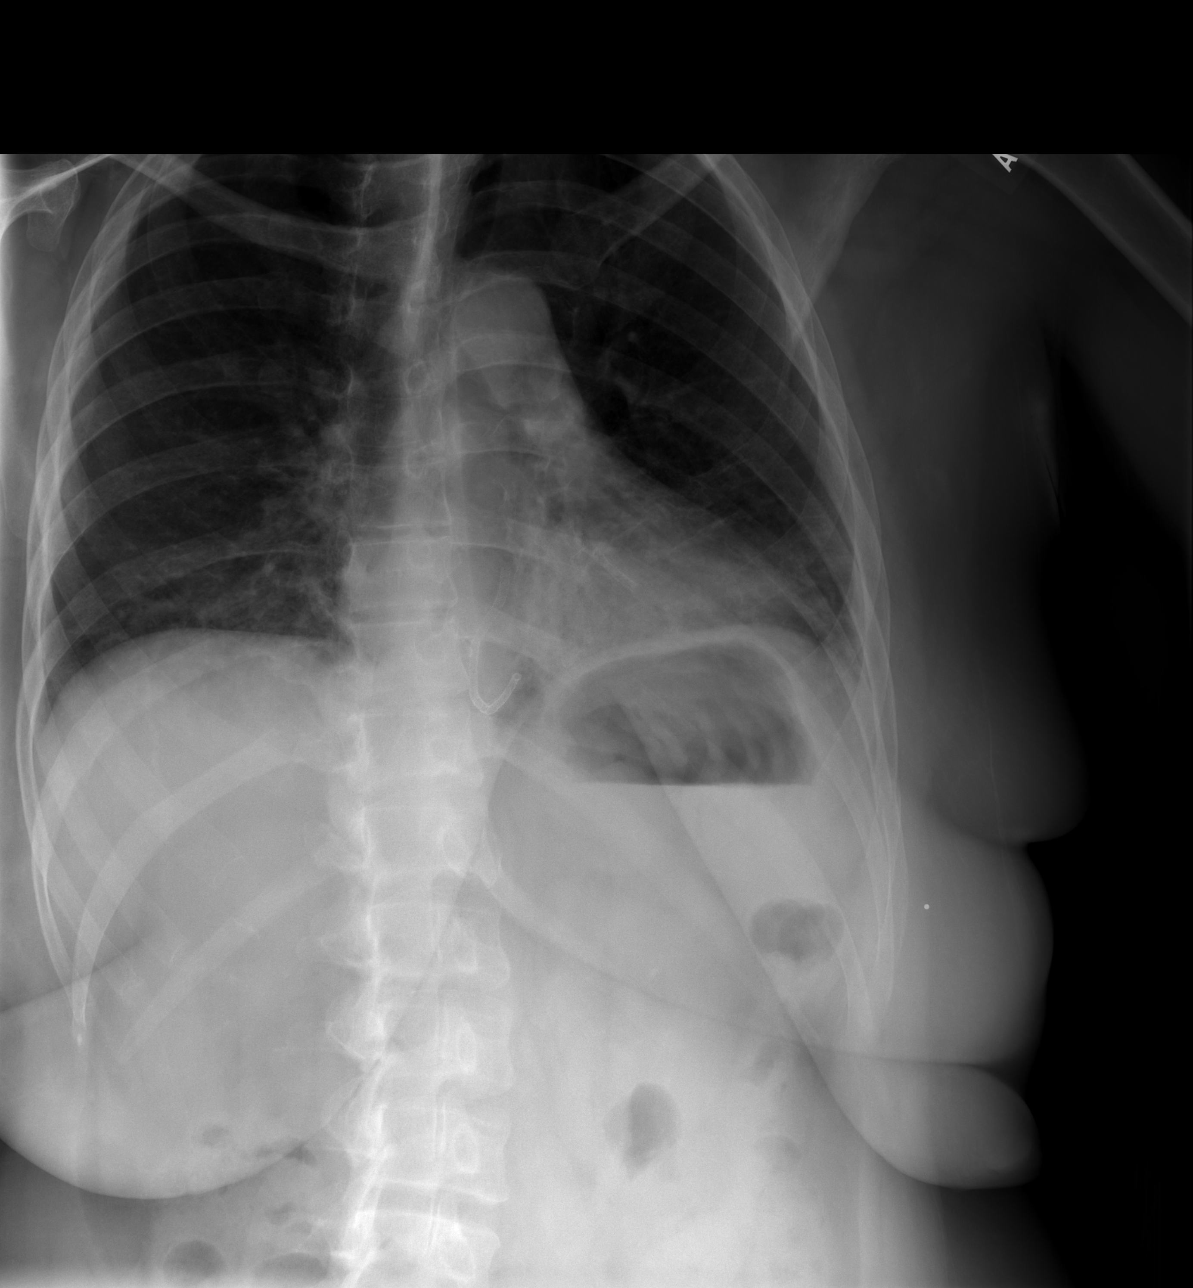

[3 of 3 positions shown; findings below may reference images not displayed]

FINDINGS: Heart size is normal. The lungs are free of focal consolidations and
pleural effusions. There is minimal LEFT LOWER lobe atelectasis.
Coronary stent is visible. No pulmonary edema. No pneumothorax.

Oblique views demonstrate acute fractures of the LEFT anterior 7th
and 8th ribs.
IMPRESSION: Acute fractures of the LEFT anterior 7th and 8th ribs associated
LEFT basilar atelectasis. No pneumothorax.

## 2018-07-02 ENCOUNTER — Encounter (HOSPITAL_BASED_OUTPATIENT_CLINIC_OR_DEPARTMENT_OTHER): Payer: Medicaid Other | Attending: Internal Medicine

## 2018-07-02 DIAGNOSIS — E11621 Type 2 diabetes mellitus with foot ulcer: Secondary | ICD-10-CM | POA: Diagnosis not present

## 2018-07-02 DIAGNOSIS — Z7902 Long term (current) use of antithrombotics/antiplatelets: Secondary | ICD-10-CM | POA: Diagnosis not present

## 2018-07-02 DIAGNOSIS — I2510 Atherosclerotic heart disease of native coronary artery without angina pectoris: Secondary | ICD-10-CM | POA: Diagnosis not present

## 2018-07-02 DIAGNOSIS — L97522 Non-pressure chronic ulcer of other part of left foot with fat layer exposed: Secondary | ICD-10-CM | POA: Insufficient documentation

## 2018-07-02 DIAGNOSIS — M069 Rheumatoid arthritis, unspecified: Secondary | ICD-10-CM | POA: Diagnosis not present

## 2018-07-02 DIAGNOSIS — M0689 Other specified rheumatoid arthritis, multiple sites: Secondary | ICD-10-CM | POA: Diagnosis not present

## 2018-07-09 ENCOUNTER — Ambulatory Visit: Payer: Medicaid Other | Admitting: Internal Medicine

## 2018-07-09 ENCOUNTER — Encounter: Payer: Self-pay | Admitting: Internal Medicine

## 2018-07-09 VITALS — BP 130/80 | HR 78 | Resp 12 | Ht 61.0 in | Wt 166.0 lb

## 2018-07-09 DIAGNOSIS — R079 Chest pain, unspecified: Secondary | ICD-10-CM

## 2018-07-09 DIAGNOSIS — Z716 Tobacco abuse counseling: Secondary | ICD-10-CM

## 2018-07-09 DIAGNOSIS — Z720 Tobacco use: Secondary | ICD-10-CM | POA: Diagnosis not present

## 2018-07-09 DIAGNOSIS — K209 Esophagitis, unspecified without bleeding: Secondary | ICD-10-CM

## 2018-07-09 DIAGNOSIS — E782 Mixed hyperlipidemia: Secondary | ICD-10-CM

## 2018-07-09 DIAGNOSIS — L97421 Non-pressure chronic ulcer of left heel and midfoot limited to breakdown of skin: Secondary | ICD-10-CM

## 2018-07-09 DIAGNOSIS — E876 Hypokalemia: Secondary | ICD-10-CM

## 2018-07-09 DIAGNOSIS — E11621 Type 2 diabetes mellitus with foot ulcer: Secondary | ICD-10-CM

## 2018-07-09 DIAGNOSIS — L739 Follicular disorder, unspecified: Secondary | ICD-10-CM

## 2018-07-09 DIAGNOSIS — E118 Type 2 diabetes mellitus with unspecified complications: Secondary | ICD-10-CM

## 2018-07-09 MED ORDER — POTASSIUM CHLORIDE ER 20 MEQ PO TBCR: EXTENDED_RELEASE_TABLET | ORAL | 11 refills | Status: DC

## 2018-07-09 MED ORDER — DOXYCYCLINE HYCLATE 100 MG PO TABS: ORAL_TABLET | ORAL | 0 refills | Status: DC

## 2018-07-09 NOTE — Progress Notes (Signed)
Subjective:    Patient ID: [name] [name], female    DOB: Apr 12, 1966, 52 y.o.   MRN: [id number]  HPI   1.  Hypokalemia:  Noted at 2.5 with recent hospitalization 06/15/18 for chest pain ultimately felt due to esophagitis.   Had labs done with Rheumatology yesterday, 07/08/2018,  and K+ was still low at 3.3.  2.  Esophagitis:  Sees Dr. Alanson [name] with GI tomorrow in follow up of her esophagitis.  She is taking Pantoprazole 40 mg twice daily.  She feels this has helped a little bit. She is using chocolate and alcohol a bit.  Had problems after eating and drinking those together. She does love onions and tomatoes, which also make this worse.    3.  Tobacco abuse:  Smoking up to 1/2 ppd now.  Boyfriend smokes less.  Has the patches, but has not started.  Coughing again productive of yellow green mucous for past 2 months.  Just finished Metronidazole last month.  Prescribed by Dr. Alanson [name] for GI reasons and did also help the mucous production somewhat.  4.  DM:  Last A1C checked end of November was 6.7%.  [name] [name] with [facility]. Has been losing weight due to problems swallowing, but also has been trying to lose weight.   5.  Hyperlipidemia:  She is unaware of whether her cholesterol was checked.  Looking at labs from hospitalization, cholesterol panel was excellent and at goal.  Lipid Panel     Component Value Date/Time   CHOL 114 06/16/2018 0642   CHOL 145 02/26/2018 1008   TRIG 72 06/16/2018 0642   HDL 54 06/16/2018 0642   HDL 51 02/26/2018 1008   CHOLHDL 2.1 06/16/2018 0642   VLDL 14 06/16/2018 0642   LDLCALC 46 06/16/2018 0642   LDLCALC 71 02/26/2018 1008    6.  Left Heel Foot ulcer:  Continues with wound center.  Slowly healing.  7.  Left groin pustules:  Has had for about 2 months on and off.  Drain and bleed and then itch.  Developing on right now as well.  Using Triamcinolone cream and ointment on the areas  Current Meds  Medication Sig  . acetaminophen (TYLENOL) 500 MG tablet Take  500-1,000 mg by mouth every 6 (six) hours as needed for moderate pain.   Marland Kitchen albuterol (PROVENTIL HFA;VENTOLIN HFA) 108 (90 Base) MCG/ACT inhaler Inhale 2 puffs into the lungs every 6 (six) hours as needed for wheezing or shortness of breath.  . clopidogrel (PLAVIX) 75 MG tablet Take 1 tablet (75 mg total) by mouth daily.  . cyclobenzaprine (FLEXERIL) 10 MG tablet Take 1-2 tablets by mouth at bedtime as needed. Muscle spasms  . folic acid (FOLVITE) 1 MG tablet Take 1 mg by mouth daily.  . furosemide (LASIX) 40 MG tablet TAKE 1 TABLET BY MOUTH EVERY MORNING (Patient taking differently: Take 40 mg by mouth daily. )  . Incontinence Supply Disposable (POISE ULTRA THINS) PADS Use 5 pads daily as needed for urinary stress incontinence  . insulin aspart (NOVOLOG) 100 UNIT/ML injection Inject 4-16 Units into the skin 3 (three) times daily with meals. Sliding scale CBG 100-150; 4 units, 151-200; 6 units, 201-250; 8 units, 251-300; 10 units, 301-350; 12 units, 351-400; 14 units, > 401; call doctor  . LANTUS SOLOSTAR 100 UNIT/ML Solostar Pen Inject 10 Units into the skin 2 (two) times daily.   . methotrexate (RHEUMATREX) 2.5 MG tablet 3 tabs by mouth twice weekly (Patient taking differently: Take 7.5 mg by mouth  2 (two) times a week. On Wednesday and Thursday)  . metoCLOPramide (REGLAN) 10 MG tablet Take 0.5 tablets (5 mg total) by mouth 4 (four) times daily -  before meals and at bedtime. (Patient taking differently: Take 10 mg by mouth 4 (four) times daily -  before meals and at bedtime. )  . metoprolol tartrate (LOPRESSOR) 25 MG tablet Take 1 tablet (25 mg total) by mouth 2 (two) times daily.  . nitroGLYCERIN (NITROSTAT) 0.4 MG SL tablet PLACE 1 TABLET (0.4 MG TOTAL) UNDER THE TONGUE EVERY 5 (FIVE) MINUTES AS NEEDED FOR CHEST PAIN.  Marland Kitchen ondansetron (ZOFRAN) 4 MG tablet Take 1 tablet (4 mg total) by mouth every 6 (six) hours.  . pantoprazole (PROTONIX) 40 MG tablet Take 1 tablet (40 mg total) by mouth daily.  .  prednisoLONE acetate (PRED FORTE) 1 % ophthalmic suspension Place 4-5 drops into the left eye daily.  . rosuvastatin (CRESTOR) 40 MG tablet Take 1 tablet (40 mg total) by mouth daily.  . sertraline (ZOLOFT) 100 MG tablet 1 tab by mouth daily (Patient taking differently: Take 100 mg by mouth daily. )  . traMADol (ULTRAM) 50 MG tablet TAKE 1 TABLET EVERY 6 HOURS AS NEEDED FOR MODERATE PAIN (Patient taking differently: Take 50 mg by mouth every 6 (six) hours as needed for moderate pain. )  . triamcinolone ointment (KENALOG) 0.1 % AAPLY 1 APPLICATION TWICE A DAY GROIN 7 DAYS  . [DISCONTINUED] pantoprazole (PROTONIX) 40 MG tablet Take 1 tablet (40 mg total) by mouth 2 (two) times daily before a meal for 30 days, THEN 1 tablet (40 mg total) daily before breakfast.    Allergies  Allergen Reactions  . Aspirin Other (See Comments)    Stomach bleeds Stomach bleeds GI bleeding Other reaction(s): Other (See Comments) GI bleeding Stomach bleeds     Review of Systems     Objective:   Physical Exam Congested coughing HEENT:  PERRL, EOMI, TMs pearly gray, throat without injection.  Nasal mucosa swollen and red.  Tender over maxillary sinus area bilaterally Neck:  Supple, No adenopathy Chest:  CTA CV:  RRR without murmur or rub.  Radial and DP pulse normal and equal Abd:  S, Mild epigastric tenderness.  No HSM or mass, + BS Groin:  Left with 2 mm pustules about hair follicles of peripheral pubic hairline.  These are starting to coalesce on a larger erythematous base.  Has lesions that have opened and drained and appear to be in different stages of healing.  Similar smaller lesions on right hairline of pubic hairs. LE: No edmea       Assessment & Plan:  1.  Sinusitis and folliculitis, the latter worsened by use of cortisone cream/ointments.  To stop Triamcinolone.   Culture taken of pustular material from groin lesions Doxycycline 100 mg twice daily for 10 days.  Warm pack groin lesions to  allow better drainage.  2.  Esophagitis:  Needs to stop smoking again and avoid alcohol, chocolate and tomatoes/onions. Appt with [name] [name] tomorrow.  3. Hypokalemia:  Start potassium hydrochloride 20 mEq daily.  Repeat BMP just after Christmas  4.  DM/Hyperlipidemia:  Well controlled.  5.  Tobacco abuse and counseling:  Again discussed the patches only help, but will not completely remove the urge to smoke.  She is doing well counseling with [name] [name], MSW intern and feels her stress levels have dropped significantly.  She and her boyfriend, who is no longer using crack cocaine and being followed for this  will talk, call the quit line and get started on patches.  6.  Left foot ulcer:  Healing and followed by wound center.

## 2018-07-09 NOTE — Patient Instructions (Signed)
Tobacco Cessation:   [id number] or [phone], the former for support and possibly free nicotine patches/gum and support; the latter for [facility] Smoking cessation class. Get rid of all smoking supplies:  Cigarettes, lighters, ashtrays--no stashes just in case at home if you are serious.For nicotine patches:  Stop smoking anything the day you start the first patch Start with 21 mg patch and reapply new to different area of skin every 24 hours for 28 days. Then 14 mg patch changed every 24 hours for 14 days. Then 7 mg patch changed every 24 hours for 14 days.  

## 2018-07-10 ENCOUNTER — Other Ambulatory Visit: Payer: Medicaid Other | Admitting: Internal Medicine

## 2018-07-10 DIAGNOSIS — Z716 Tobacco abuse counseling: Secondary | ICD-10-CM

## 2018-07-10 NOTE — Progress Notes (Signed)
   THERAPY PROGRESS NOTE  Session Time: 60 min  Participation Level: Active  Behavioral Response: CasualAlertEuthymic  Type of Therapy: Individual Therapy  Treatment Goals addressed: Coping  Interventions: Motivational Interviewing  Summary: Charidy Lobo is a 52 y.o. female who presents with an euthymic mood and appropriate affect. Ethyle reported the results from her recent doctors' visits. She also shared with me that she has decided to quit smoking after consulting with her doctors this week. She expressed her motivation to care for her body more at this time in her life, as she notices the negative effects that smoking has on her health. Rosy also expressed concerns about her appetite increasing when she decreases her tobacco intake. Jolette disclosed some boundaries that she has established with her boyfriend, and how the new rules are giving her a sense of control over her life. She described how the counseling sessions with the Social Work Intern (SWI) are helping her to process her emotions and "get out the bottled up feelings."   Therapist Response:    Social Work Intern (SWI) used motivational inteviewing techniques to validate Amalee's decision to quit smoking and agreed that her body would benefit greatly from this choice. SWI reminded Felesia of her internal strengths as evidenced by her ability to stop abusing substances in the past. Carmelite and SWI brainstormed ideas for helping Kryslyn to cope with the appetite increase that may accompany tobacco abstinence. SWI validated Ryka's decision to establish boundaries with her boyfriend, and shared her observation that this choice has had a positive effect on Algie's emotional well-being. The SWI and Rebel made an appointment to meet before the holiday break.  .  Suicidal/Homicidal: Nowithout intent/plan  Plan: Return again in 1 week.   Prentiss Bells, Student-Social Work 07/10/2018

## 2018-07-13 LAB — WOUND CULTURE

## 2018-07-17 ENCOUNTER — Other Ambulatory Visit: Payer: Medicaid Other | Admitting: Internal Medicine

## 2018-07-17 DIAGNOSIS — Z716 Tobacco abuse counseling: Secondary | ICD-10-CM

## 2018-07-17 DIAGNOSIS — F439 Reaction to severe stress, unspecified: Secondary | ICD-10-CM
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
## 2018-07-18 DIAGNOSIS — E11621 Type 2 diabetes mellitus with foot ulcer: Secondary | ICD-10-CM | POA: Diagnosis not present

## 2018-07-26 ENCOUNTER — Other Ambulatory Visit (INDEPENDENT_AMBULATORY_CARE_PROVIDER_SITE_OTHER): Payer: Medicaid Other

## 2018-07-26 DIAGNOSIS — Z79899 Other long term (current) drug therapy: Secondary | ICD-10-CM

## 2018-07-27 LAB — BASIC METABOLIC PANEL
BUN/Creatinine Ratio: 12 (ref 9–23)
BUN: 17 mg/dL (ref 6–24)
CO2: 24 mmol/L (ref 20–29)
Calcium: 10.2 mg/dL (ref 8.7–10.2)
Chloride: 102 mmol/L (ref 96–106)
Creatinine, Ser: 1.37 mg/dL — ABNORMAL HIGH (ref 0.57–1.00)
GFR calc Af Amer: 52 mL/min/{1.73_m2} — ABNORMAL LOW (ref >59)
GFR, EST NON AFRICAN AMERICAN: 45 mL/min/{1.73_m2} — AB (ref >59)
GLUCOSE: 162 mg/dL — AB (ref 65–99)
Potassium: 5.5 mmol/L — ABNORMAL HIGH (ref 3.5–5.2)
Sodium: 139 mmol/L (ref 134–144)
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
## 2018-07-30 ENCOUNTER — Ambulatory Visit: Payer: Medicaid Other | Admitting: Interventional Cardiology

## 2018-07-30 ENCOUNTER — Other Ambulatory Visit (INDEPENDENT_AMBULATORY_CARE_PROVIDER_SITE_OTHER): Payer: Medicaid Other

## 2018-07-30 ENCOUNTER — Encounter: Payer: Self-pay | Admitting: Interventional Cardiology

## 2018-07-30 ENCOUNTER — Other Ambulatory Visit: Payer: Self-pay

## 2018-07-30 VITALS — BP 114/60 | HR 86 | Ht 61.0 in | Wt 163.4 lb

## 2018-07-30 DIAGNOSIS — I252 Old myocardial infarction: Secondary | ICD-10-CM

## 2018-07-30 DIAGNOSIS — E875 Hyperkalemia: Secondary | ICD-10-CM

## 2018-07-30 DIAGNOSIS — Z720 Tobacco use: Secondary | ICD-10-CM

## 2018-07-30 DIAGNOSIS — E1159 Type 2 diabetes mellitus with other circulatory complications: Secondary | ICD-10-CM

## 2018-07-30 DIAGNOSIS — I25119 Atherosclerotic heart disease of native coronary artery with unspecified angina pectoris: Secondary | ICD-10-CM | POA: Diagnosis not present

## 2018-07-30 DIAGNOSIS — E785 Hyperlipidemia, unspecified: Secondary | ICD-10-CM | POA: Diagnosis not present

## 2018-07-30 DIAGNOSIS — I130 Hypertensive heart and chronic kidney disease with heart failure and stage 1 through stage 4 chronic kidney disease, or unspecified chronic kidney disease: Secondary | ICD-10-CM

## 2018-07-30 DIAGNOSIS — I10 Essential (primary) hypertension: Secondary | ICD-10-CM

## 2018-07-30 MED ORDER — FUROSEMIDE 40 MG PO TABS: 40.0000 mg | ORAL_TABLET | Freq: Every morning | ORAL | 11 refills | Status: DC

## 2018-07-30 NOTE — Patient Instructions (Signed)

## 2018-07-31 LAB — BASIC METABOLIC PANEL
BUN / CREAT RATIO: 10 (ref 9–23)
BUN: 11 mg/dL (ref 6–24)
CO2: 22 mmol/L (ref 20–29)
Calcium: 10 mg/dL (ref 8.7–10.2)
Chloride: 106 mmol/L (ref 96–106)
Creatinine, Ser: 1.14 mg/dL — ABNORMAL HIGH (ref 0.57–1.00)
GFR, EST AFRICAN AMERICAN: 64 mL/min/{1.73_m2} (ref >59)
GFR, EST NON AFRICAN AMERICAN: 56 mL/min/{1.73_m2} — AB (ref >59)
Glucose: 37 mg/dL — CL (ref 65–99)
Potassium: 4.3 mmol/L (ref 3.5–5.2)
Sodium: 147 mmol/L — ABNORMAL HIGH (ref 134–144)

## 2018-08-01 ENCOUNTER — Encounter (HOSPITAL_BASED_OUTPATIENT_CLINIC_OR_DEPARTMENT_OTHER): Payer: Medicaid Other | Attending: Internal Medicine

## 2018-08-01 ENCOUNTER — Ambulatory Visit (HOSPITAL_COMMUNITY)
Admission: RE | Admit: 2018-08-01 | Discharge: 2018-08-01 | Disposition: A | Discharge status: Home / Self Care | Payer: Medicaid Other | Source: Ambulatory Visit | Attending: Internal Medicine | Admitting: Internal Medicine

## 2018-08-01 ENCOUNTER — Encounter (HOSPITAL_BASED_OUTPATIENT_CLINIC_OR_DEPARTMENT_OTHER): Payer: Self-pay

## 2018-08-01 ENCOUNTER — Other Ambulatory Visit (HOSPITAL_COMMUNITY): Payer: Self-pay | Admitting: Internal Medicine

## 2018-08-01 DIAGNOSIS — E11621 Type 2 diabetes mellitus with foot ulcer: Secondary | ICD-10-CM | POA: Diagnosis present

## 2018-08-01 DIAGNOSIS — L97429 Non-pressure chronic ulcer of left heel and midfoot with unspecified severity: Secondary | ICD-10-CM | POA: Insufficient documentation

## 2018-08-01 DIAGNOSIS — Z7902 Long term (current) use of antithrombotics/antiplatelets: Secondary | ICD-10-CM | POA: Diagnosis not present

## 2018-08-01 DIAGNOSIS — Z7982 Long term (current) use of aspirin: Secondary | ICD-10-CM | POA: Diagnosis not present

## 2018-08-01 DIAGNOSIS — Z79899 Other long term (current) drug therapy: Secondary | ICD-10-CM | POA: Insufficient documentation

## 2018-08-01 DIAGNOSIS — M0689 Other specified rheumatoid arthritis, multiple sites: Secondary | ICD-10-CM | POA: Diagnosis not present

## 2018-08-01 DIAGNOSIS — E10621 Type 1 diabetes mellitus with foot ulcer: Secondary | ICD-10-CM | POA: Insufficient documentation

## 2018-08-01 DIAGNOSIS — L97522 Non-pressure chronic ulcer of other part of left foot with fat layer exposed: Secondary | ICD-10-CM | POA: Diagnosis not present
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
## 2018-08-08 DIAGNOSIS — E11621 Type 2 diabetes mellitus with foot ulcer: Secondary | ICD-10-CM | POA: Diagnosis not present

## 2018-08-12 ENCOUNTER — Encounter: Payer: Self-pay | Admitting: Internal Medicine

## 2018-08-12 ENCOUNTER — Telehealth: Payer: Self-pay

## 2018-08-12 ENCOUNTER — Ambulatory Visit: Payer: Medicaid Other | Admitting: Internal Medicine

## 2018-08-12 VITALS — BP 130/72 | HR 76 | Resp 12 | Ht 61.0 in | Wt 163.0 lb

## 2018-08-12 DIAGNOSIS — R1310 Dysphagia, unspecified: Secondary | ICD-10-CM | POA: Diagnosis not present

## 2018-08-12 DIAGNOSIS — Z716 Tobacco abuse counseling: Secondary | ICD-10-CM | POA: Diagnosis not present

## 2018-08-12 DIAGNOSIS — L739 Follicular disorder, unspecified: Secondary | ICD-10-CM | POA: Diagnosis not present

## 2018-08-12 DIAGNOSIS — K2100 Gastro-esophageal reflux disease with esophagitis, without bleeding: Secondary | ICD-10-CM

## 2018-08-12 MED ORDER — SULFAMETHOXAZOLE-TRIMETHOPRIM 800-160 MG PO TABS: ORAL_TABLET | ORAL | 0 refills | Status: DC

## 2018-08-12 NOTE — Telephone Encounter (Signed)
Left message for Dr. Berline Lopes nurse to call back. Per Dr. Delrae Alfred would like to have patient to cut methotrexate down to 1 pill a day while taking antibiotics. States patient informed her wound care had previously reached out in regards to stopping mediation all together due to slowing dow the process of her foot healing. Dr. Delrae Alfred would like to know if it is possible to go ahead and stop medication now.

## 2018-08-12 NOTE — Progress Notes (Signed)
Subjective:    Patient ID: [name] [name], female    DOB: 1965-11-30, 53 y.o.   MRN: [id number]  HPI   Painful itchy rash under right axilla starting 3 days ago.  She states she started out with just one bump and as she scratched, it seemed to spread. She also has developed bumps in her left groin where she had either an infected lymph node or ingrown hair, which seemed healed when last seen.    Was on Doxycycline back in first half of December for furunculosis of left groin.    States her sugars are well controlled and A1C in December was 6.7%  Brings up Dr. Marca Ancona is stopping her Metoclopramide.  She was taking 5 mg 3 times daily before meals and last time stopped, developed more dysphagia.   She states to swallow, she has pain in her right chest.  States she has shared this with Dr. Marca Ancona.   Cannot wear a bra due to the discomfort Has to smoke MJ to eat.  She has not shared this with Dr. Marca Ancona She is smoking 1/3 ppd of regular cigarettes. Patient states Dr. Marca Ancona is concerned she will develop "the shakes." Discussed some of the neurologic side effects possible with Metoclopramide.  Current Meds  Medication Sig  . acetaminophen (TYLENOL) 500 MG tablet Take 500-1,000 mg by mouth every 6 (six) hours as needed for moderate pain.   Marland Kitchen albuterol (PROVENTIL HFA;VENTOLIN HFA) 108 (90 Base) MCG/ACT inhaler Inhale 2 puffs into the lungs every 6 (six) hours as needed for wheezing or shortness of breath.  . clopidogrel (PLAVIX) 75 MG tablet Take 1 tablet (75 mg total) by mouth daily.  . cyclobenzaprine (FLEXERIL) 10 MG tablet Take 1-2 tablets by mouth at bedtime as needed. Muscle spasms  . folic acid (FOLVITE) 1 MG tablet Take 1 mg by mouth daily.  . furosemide (LASIX) 40 MG tablet Take 1 tablet (40 mg total) by mouth every morning.  . Incontinence Supply Disposable (POISE ULTRA THINS) PADS Use 5 pads daily as needed for urinary stress incontinence  . insulin aspart (NOVOLOG) 100 UNIT/ML injection  Inject 4-16 Units into the skin 3 (three) times daily with meals. Sliding scale CBG 100-150; 4 units, 151-200; 6 units, 201-250; 8 units, 251-300; 10 units, 301-350; 12 units, 351-400; 14 units, > 401; call doctor  . LANTUS SOLOSTAR 100 UNIT/ML Solostar Pen Inject 10 Units into the skin 2 (two) times daily.   . methotrexate (RHEUMATREX) 2.5 MG tablet 3 tabs by mouth twice weekly (Patient taking differently: Take 7.5 mg by mouth 2 (two) times a week. On Wednesday and Thursday)  . metoprolol tartrate (LOPRESSOR) 25 MG tablet Take 1 tablet (25 mg total) by mouth 2 (two) times daily.  . nitroGLYCERIN (NITROSTAT) 0.4 MG SL tablet PLACE 1 TABLET (0.4 MG TOTAL) UNDER THE TONGUE EVERY 5 (FIVE) MINUTES AS NEEDED FOR CHEST PAIN.  Marland Kitchen ondansetron (ZOFRAN) 4 MG tablet Take 1 tablet (4 mg total) by mouth every 6 (six) hours.  . pantoprazole (PROTONIX) 40 MG tablet Take 1 tablet (40 mg total) by mouth daily.  . prednisoLONE acetate (PRED FORTE) 1 % ophthalmic suspension Place 4-5 drops into the left eye daily.  . rosuvastatin (CRESTOR) 40 MG tablet Take 1 tablet (40 mg total) by mouth daily.  . sertraline (ZOLOFT) 100 MG tablet 1 tab by mouth daily (Patient taking differently: Take 100 mg by mouth daily. )  . traMADol (ULTRAM) 50 MG tablet TAKE 1 TABLET EVERY 6 HOURS  AS NEEDED FOR MODERATE PAIN (Patient taking differently: Take 50 mg by mouth every 6 (six) hours as needed for moderate pain. )    Allergies  Allergen Reactions  . Aspirin Other (See Comments)    Stomach bleeds Stomach bleeds GI bleeding Other reaction(s): Other (See Comments) GI bleeding Stomach bleeds      Review of Systems     Objective:   Physical Exam Pustules in no particular pattern in right axilla, all no larger than 2-3 mm pustules on small red bases.  Has hyperpigmentation in small circles of same diameter as red bases scattered over some area.    Mildly tender over axillary nodes.  No fluctuance.  One tiny pustule involving  hair in right groin, but no surrounding erythema.  NT       Assessment & Plan:  Folliculitis: Just completed course of Doxycycline in December.  Will try Bactrim DS, warm compresses.  Sending a culture of pustular material as well. Decrease dose of MTX to 1 tab per dose.  Will try to get hold of Rheum to see if okay to hold this week all together.  May help with wound healing of foot as well.  Dysphagia/GERD, early satiety, poor appetite:  Encouraged her to work on smoking cessation.  She continues with counseling with [name] [name] on this as well.  Down to 1/3 ppd. To give being Metoclopramide free a chance, and if symptoms worsen, she will call.   She needs to share all her GI difficulties and home remedies with her specialty physicians.  Will take a look at incoming information from Dr. Marca AnconaKarki as well to see why she felt strongly about removing this med.  Foot ulcer:  Wound center talking to Rheumatology about the possibility of stopping MTX until wound is healed.

## 2018-08-12 NOTE — Patient Instructions (Addendum)
Decrease Methotrexate to 1 tab per dose. Will get hold of your Rheum to discuss

## 2018-08-13 NOTE — Telephone Encounter (Signed)
Missed nurse call back. Left message again for nurse to return my call.

## 2018-08-14 ENCOUNTER — Other Ambulatory Visit: Payer: Self-pay | Admitting: Internal Medicine

## 2018-08-14 DIAGNOSIS — Z720 Tobacco use: Secondary | ICD-10-CM

## 2018-08-14 DIAGNOSIS — F439 Reaction to severe stress, unspecified: Secondary | ICD-10-CM

## 2018-08-14 LAB — WOUND CULTURE
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
## 2018-08-14 NOTE — Telephone Encounter (Signed)
Spoke with rheumatology nurse. States theye were not aware that patient had ulcer on her foot. States needs to stop methotrexate until her foot is completely healed and if she is taking any antibiotics she needs to be off the medication.   Spoke with patient informed her of message to stop medication until foot is completely healed. Patient verbalized understanding. Message left for wound care as well. Patient asked if okay to take benadryl for the itching. Per Dr. Delrae AlfredMulberry yes. It may cause dizziness and dry mouth. Patient verbalized understanding.

## 2018-08-15 DIAGNOSIS — E11621 Type 2 diabetes mellitus with foot ulcer: Secondary | ICD-10-CM | POA: Diagnosis not present

## 2018-08-19 ENCOUNTER — Ambulatory Visit: Payer: Medicaid Other | Admitting: Internal Medicine

## 2018-08-20 ENCOUNTER — Ambulatory Visit: Payer: Medicaid Other | Admitting: Internal Medicine

## 2018-08-20 ENCOUNTER — Encounter: Payer: Self-pay | Admitting: Internal Medicine

## 2018-08-20 VITALS — BP 128/78 | HR 78 | Resp 12 | Ht 61.0 in | Wt 159.0 lb

## 2018-08-20 DIAGNOSIS — Z720 Tobacco use: Secondary | ICD-10-CM | POA: Diagnosis not present

## 2018-08-20 DIAGNOSIS — Z716 Tobacco abuse counseling: Secondary | ICD-10-CM | POA: Diagnosis not present

## 2018-08-20 DIAGNOSIS — L739 Follicular disorder, unspecified: Secondary | ICD-10-CM | POA: Diagnosis not present

## 2018-08-20 MED ORDER — SULFAMETHOXAZOLE-TRIMETHOPRIM 800-160 MG PO TABS: ORAL_TABLET | ORAL | 0 refills | Status: DC

## 2018-08-20 NOTE — Progress Notes (Signed)
Subjective:    Patient ID: Caleb Harbeson, female    DOB: 1965-10-02, 53 y.o.   MRN: [id number]  HPI   1.  Right axillary folliculitis:  Stopped her MTX after we spoke with Dr. Herma CarsonZ, her Rheumatologist last week with slow healing heel ulcer and the folliculitis. Continues on Bactrim DS for the folliculitis.  Grew Staph sensitive to Bactrim DS.  Has one more day of treatment. Using Dial soap to clean regularly.   2.  Dysphagia/GERD/DM gastroparesis:  Stopped Metoclopramide and subsequently developed shakes.  The "shakes"  Have stopped.    3.  Left heel slowly healing ulcer:  Now off MTX, seems to be healing faster, though has not even been off 1 week.  4.  RA:  Dr. Herma CarsonZ, Rheumatology, is increasing her Tramadol.  5.  Weight loss:  Has been walking her dog more.    6.  Tobacco abuse:  States she is down to 5 cigarettes daily and if takes the dog for a walk, cannot smoke.  Working with our SW intern, Melina FiddlerLarkin regarding this.  Current Meds  Medication Sig  . acetaminophen (TYLENOL) 500 MG tablet Take 500-1,000 mg by mouth every 6 (six) hours as needed for moderate pain.   Marland Kitchen. albuterol (PROVENTIL HFA;VENTOLIN HFA) 108 (90 Base) MCG/ACT inhaler Inhale 2 puffs into the lungs every 6 (six) hours as needed for wheezing or shortness of breath.  . clopidogrel (PLAVIX) 75 MG tablet Take 1 tablet (75 mg total) by mouth daily.  . cyclobenzaprine (FLEXERIL) 10 MG tablet Take 1-2 tablets by mouth at bedtime as needed. Muscle spasms  . folic acid (FOLVITE) 1 MG tablet Take 1 mg by mouth daily.  . furosemide (LASIX) 40 MG tablet Take 1 tablet (40 mg total) by mouth every morning.  . Incontinence Supply Disposable (POISE ULTRA THINS) PADS Use 5 pads daily as needed for urinary stress incontinence  . insulin aspart (NOVOLOG) 100 UNIT/ML injection Inject 4-16 Units into the skin 3 (three) times daily with meals. Sliding scale CBG 100-150; 4 units, 151-200; 6 units, 201-250; 8 units, 251-300; 10 units, 301-350; 12 units,  351-400; 14 units, > 401; call doctor  . LANTUS SOLOSTAR 100 UNIT/ML Solostar Pen Inject 10 Units into the skin 2 (two) times daily.   . metoprolol tartrate (LOPRESSOR) 25 MG tablet Take 1 tablet (25 mg total) by mouth 2 (two) times daily.  . nitroGLYCERIN (NITROSTAT) 0.4 MG SL tablet PLACE 1 TABLET (0.4 MG TOTAL) UNDER THE TONGUE EVERY 5 (FIVE) MINUTES AS NEEDED FOR CHEST PAIN.  Marland Kitchen. ondansetron (ZOFRAN) 4 MG tablet Take 1 tablet (4 mg total) by mouth every 6 (six) hours.  . pantoprazole (PROTONIX) 40 MG tablet Take 1 tablet (40 mg total) by mouth daily.  . prednisoLONE acetate (PRED FORTE) 1 % ophthalmic suspension Place 4-5 drops into the left eye daily.  . rosuvastatin (CRESTOR) 40 MG tablet Take 1 tablet (40 mg total) by mouth daily.  . sertraline (ZOLOFT) 100 MG tablet 1 tab by mouth daily (Patient taking differently: Take 100 mg by mouth daily. )  . sulfamethoxazole-trimethoprim (BACTRIM DS,SEPTRA DS) 800-160 MG tablet 1 tab by mouth twice daily for 10 days.  . traMADol (ULTRAM) 50 MG tablet TAKE 1 TABLET EVERY 6 HOURS AS NEEDED FOR MODERATE PAIN (Patient taking differently: Take 50 mg by mouth every 6 (six) hours as needed for moderate pain. )    Review of Systems     Objective:   Physical Exam Right axilla with 3-4  pustules still mildly inflamed without pustules.  The rest are almost healed.  NT.       Assessment & Plan:  1.  Folliculitis, right axilla:  Healing, but will add another 5 days of Bactrim DS twice daily.  2.  Tobacco:  Encouraged her to continue with Melina FiddlerLarkin.  Sounds like this is working well for her and helping her cope with her relationship with boyfriend.   Also to work on going for more walks with her dog if that keeps her from smoking.  3.  RA:  As per her Rheumatologis, Dr. ZHerma Carson

## 2018-08-22 ENCOUNTER — Telehealth: Payer: Self-pay | Admitting: Internal Medicine

## 2018-08-22 DIAGNOSIS — E11621 Type 2 diabetes mellitus with foot ulcer: Secondary | ICD-10-CM | POA: Diagnosis not present

## 2018-08-22 NOTE — Telephone Encounter (Signed)
Patient call requesting a call back from Cherice at her earliest convenience.  Please advise.

## 2018-08-23 ENCOUNTER — Other Ambulatory Visit: Payer: Self-pay | Admitting: Internal Medicine

## 2018-08-23 ENCOUNTER — Ambulatory Visit: Payer: Medicaid Other | Admitting: Internal Medicine

## 2018-08-23 DIAGNOSIS — Z1231 Encounter for screening mammogram for malignant neoplasm of breast: Secondary | ICD-10-CM

## 2018-08-23 NOTE — Telephone Encounter (Signed)
Spoke with patient. States she has now developed a sore on her breast. States the skin is pulled back. Patient just noticed this yesterday. Patient denies any pain or bleeding associated with breast sore. States it is just irritating and the skin looks raw at the nipple. Patient also complaining of a scratch or cut in vaginal area. States it is burning and wants to know she can do for this.  To Dr. Delrae Alfred for further direction.

## 2018-08-25 ENCOUNTER — Other Ambulatory Visit: Payer: Self-pay | Admitting: Interventional Cardiology

## 2018-08-26 ENCOUNTER — Telehealth: Payer: Self-pay | Admitting: Internal Medicine

## 2018-08-26 DIAGNOSIS — L97421 Non-pressure chronic ulcer of left heel and midfoot limited to breakdown of skin: Principal | ICD-10-CM

## 2018-08-26 DIAGNOSIS — E08621 Diabetes mellitus due to underlying condition with foot ulcer: Secondary | ICD-10-CM

## 2018-08-27 ENCOUNTER — Other Ambulatory Visit: Payer: Self-pay

## 2018-08-27 MED ORDER — METOPROLOL TARTRATE 25 MG PO TABS: 25.0000 mg | ORAL_TABLET | Freq: Two times a day (BID) | ORAL | 11 refills | Status: DC

## 2018-08-28 ENCOUNTER — Other Ambulatory Visit: Payer: Medicaid Other | Admitting: Internal Medicine

## 2018-08-28 DIAGNOSIS — Z716 Tobacco abuse counseling: Secondary | ICD-10-CM

## 2018-08-28 DIAGNOSIS — F439 Reaction to severe stress, unspecified: Secondary | ICD-10-CM
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
## 2018-08-29 DIAGNOSIS — E11621 Type 2 diabetes mellitus with foot ulcer: Secondary | ICD-10-CM | POA: Diagnosis not present

## 2018-09-05 ENCOUNTER — Encounter (HOSPITAL_BASED_OUTPATIENT_CLINIC_OR_DEPARTMENT_OTHER): Payer: Medicaid Other | Attending: Internal Medicine

## 2018-09-05 DIAGNOSIS — M0689 Other specified rheumatoid arthritis, multiple sites: Secondary | ICD-10-CM | POA: Diagnosis not present

## 2018-09-05 DIAGNOSIS — I509 Heart failure, unspecified: Secondary | ICD-10-CM | POA: Diagnosis not present

## 2018-09-05 DIAGNOSIS — I2510 Atherosclerotic heart disease of native coronary artery without angina pectoris: Secondary | ICD-10-CM | POA: Diagnosis not present

## 2018-09-05 DIAGNOSIS — L97822 Non-pressure chronic ulcer of other part of left lower leg with fat layer exposed: Secondary | ICD-10-CM | POA: Diagnosis not present

## 2018-09-05 DIAGNOSIS — E11621 Type 2 diabetes mellitus with foot ulcer: Secondary | ICD-10-CM | POA: Insufficient documentation

## 2018-09-12 ENCOUNTER — Other Ambulatory Visit (INDEPENDENT_AMBULATORY_CARE_PROVIDER_SITE_OTHER): Payer: Medicaid Other | Admitting: Internal Medicine

## 2018-09-12 DIAGNOSIS — E11621 Type 2 diabetes mellitus with foot ulcer: Secondary | ICD-10-CM | POA: Diagnosis not present

## 2018-09-12 DIAGNOSIS — F439 Reaction to severe stress, unspecified: Secondary | ICD-10-CM
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
## 2018-09-17 IMAGING — CR DG FOOT COMPLETE 3+V*L*
3 series · 3 of 3 positions shown · non-contrast
Comparison: 09/24/2009

CLINICAL DATA: Left foot pain. On antibiotics for infection on back
of heel of left foot.

EXAM:
LEFT FOOT - COMPLETE 3+ VIEW

[t foot ap left]
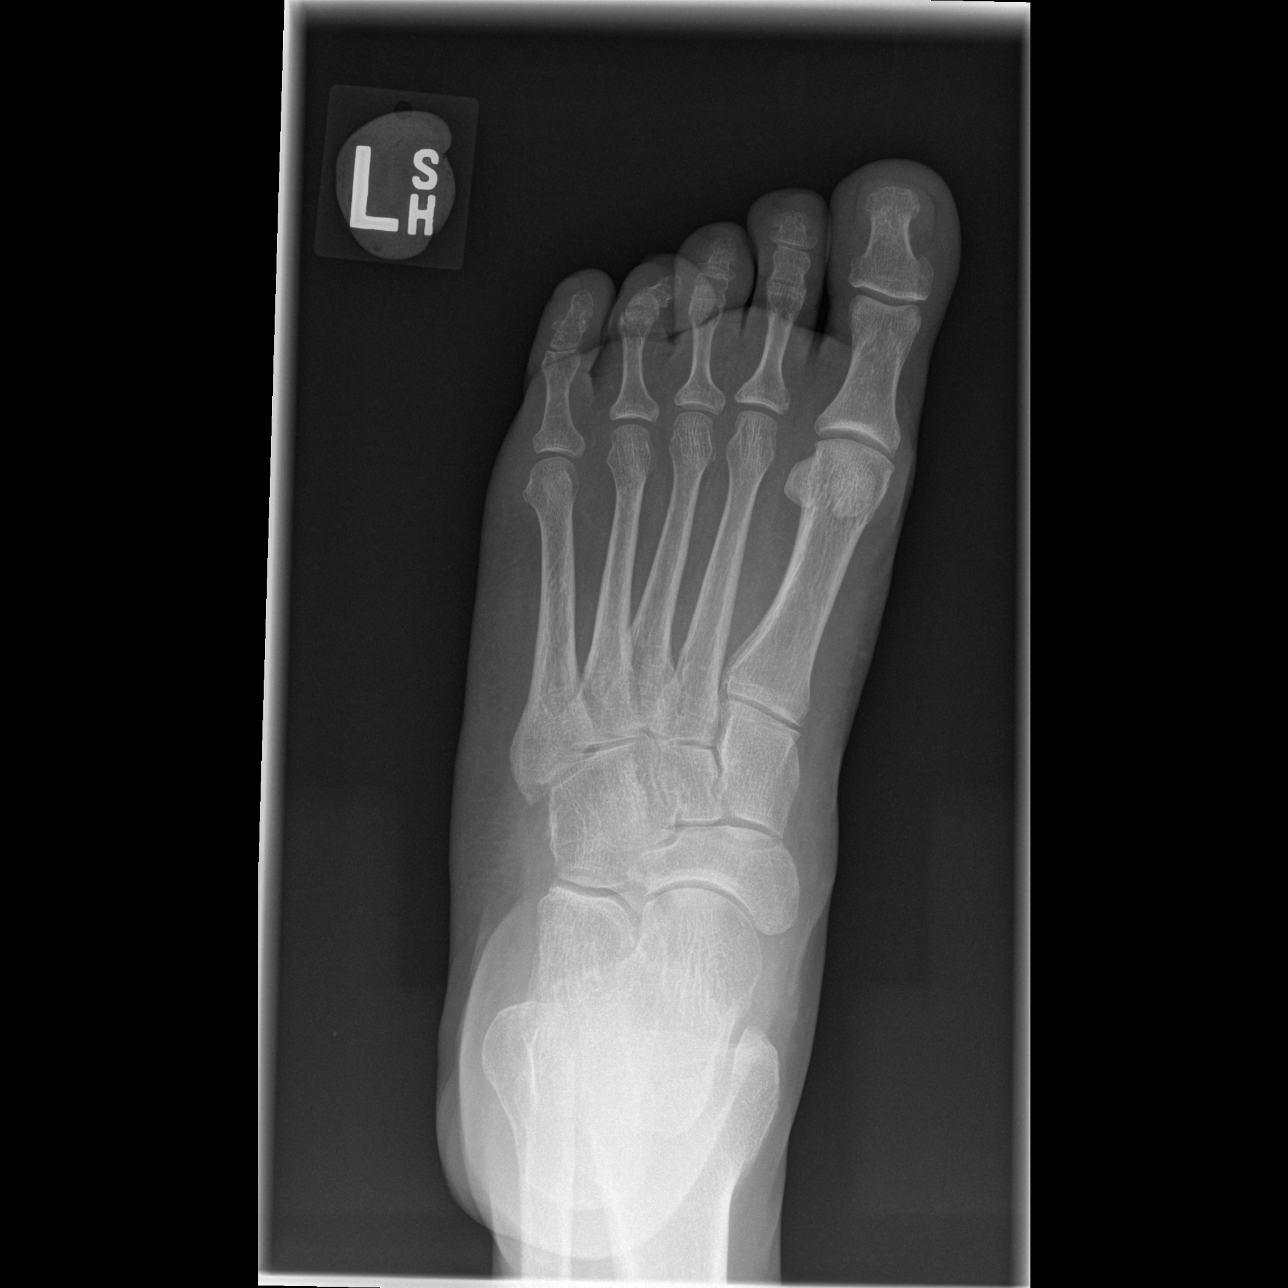

[t foot oblique left]
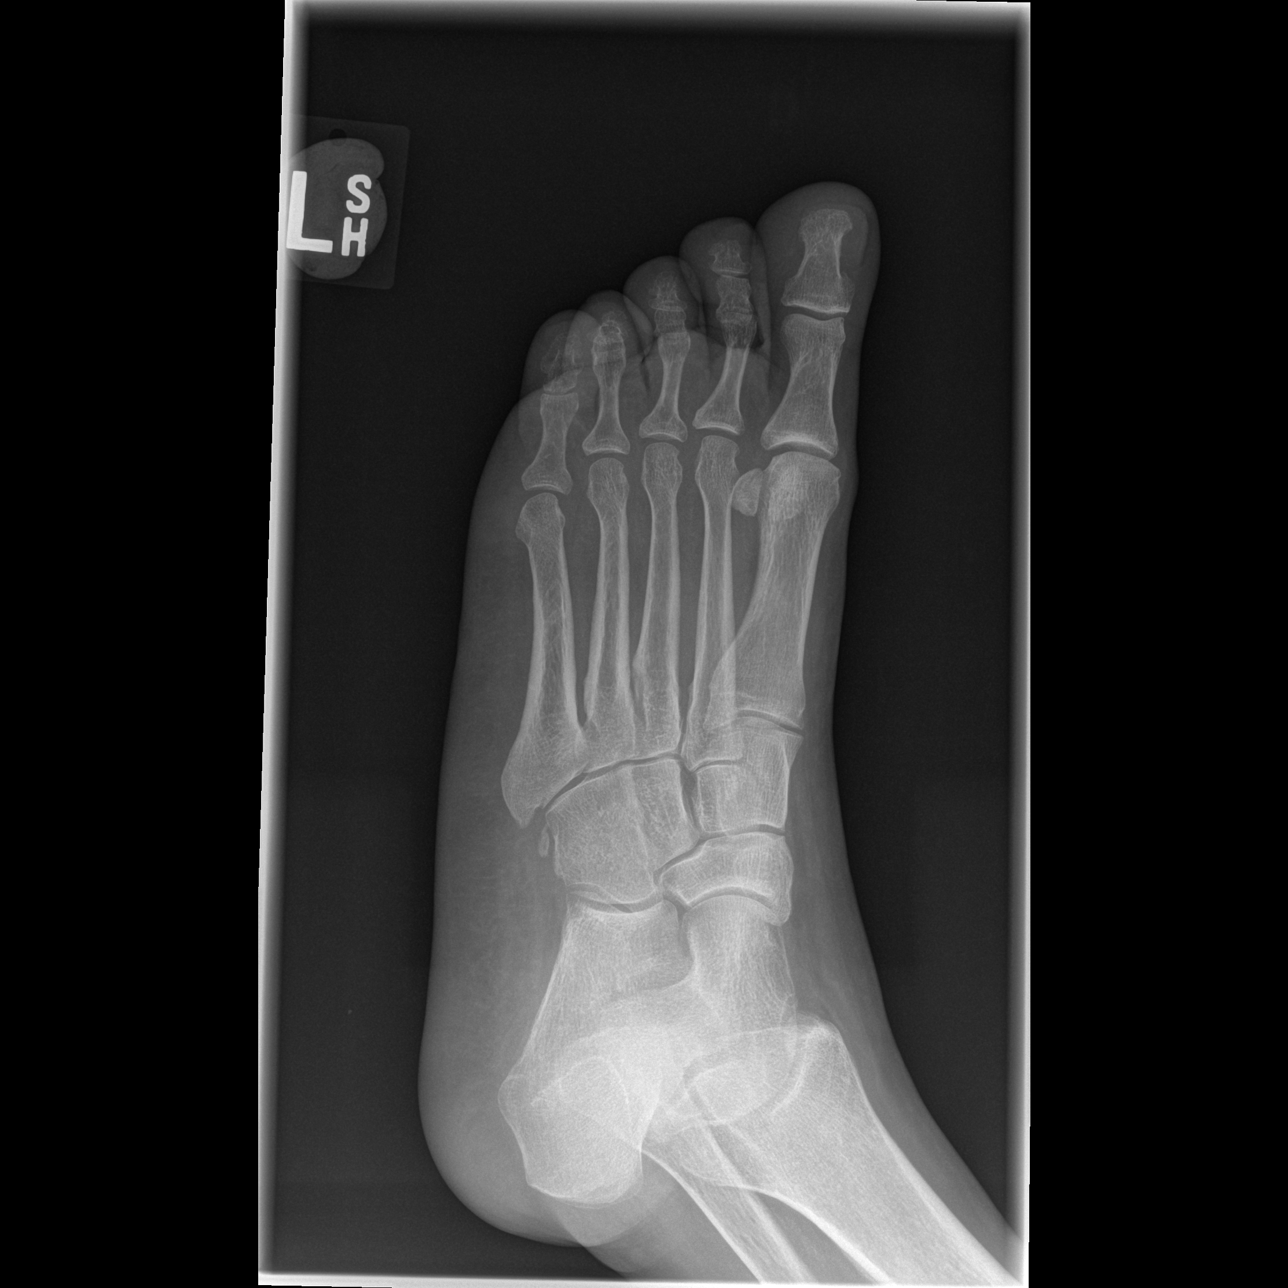

[t foot lat left]
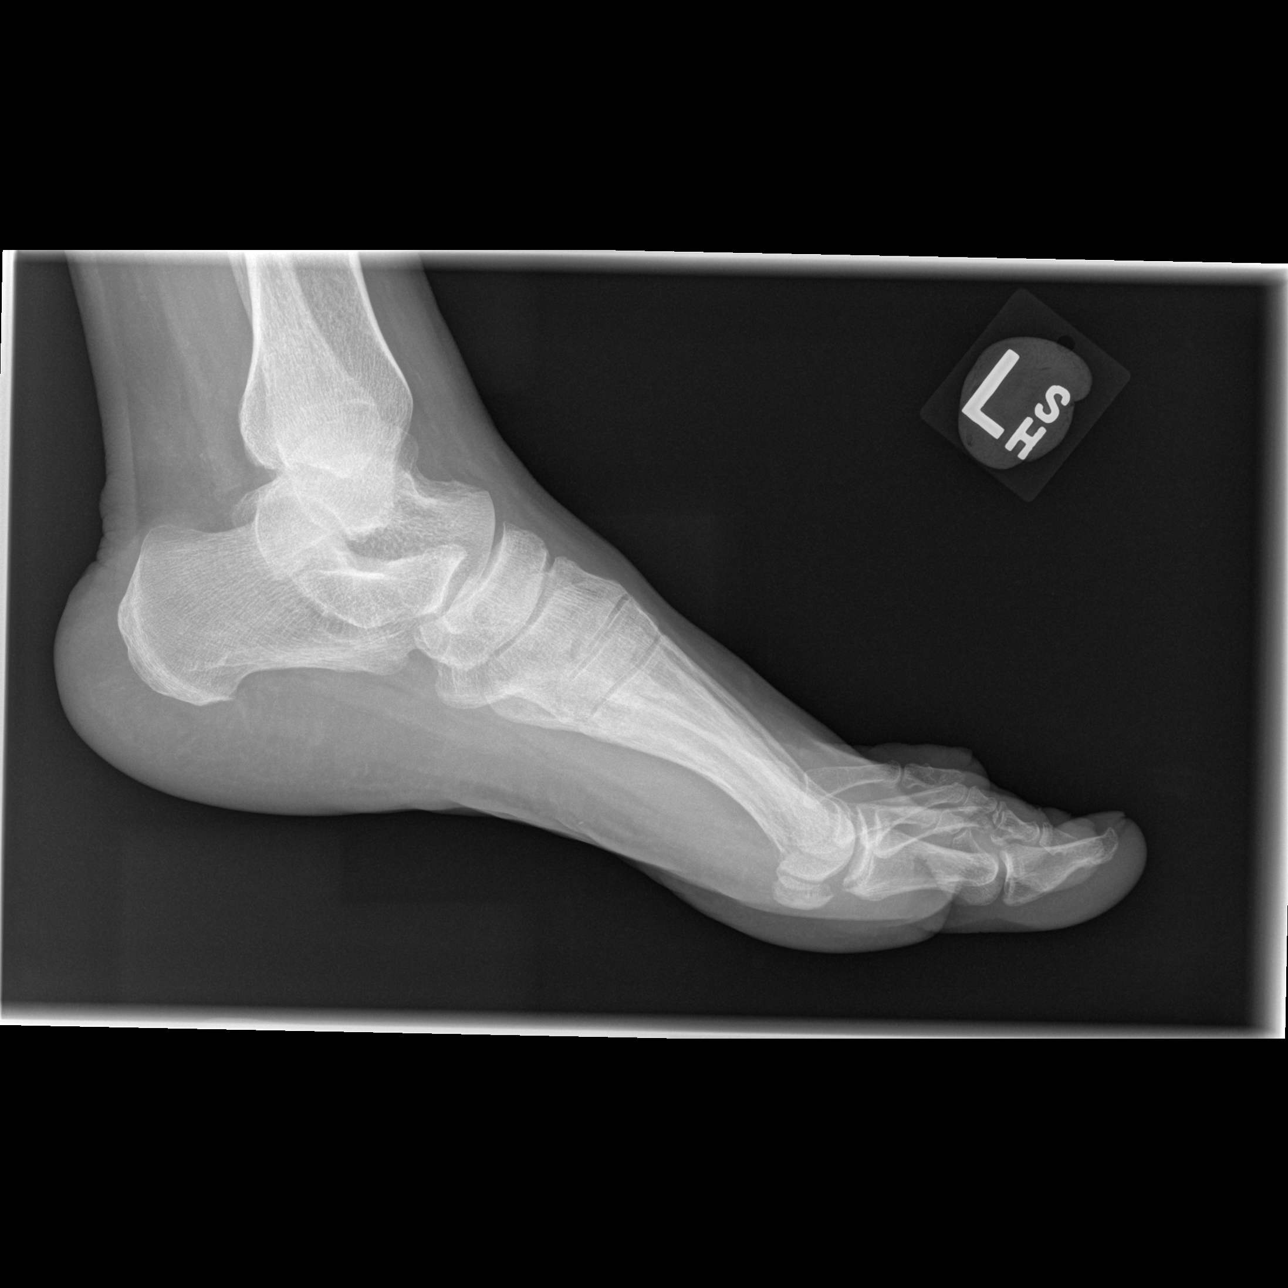

[3 of 3 positions shown; findings below may reference images not displayed]

FINDINGS: There is mild soft tissue stranding identified within Kager's fat
pad just above the calcaneus. A focal area of lucency along the
dorsal surface of the calcaneus posteriorly is noted. This appears
new when compared with comparison exam. The remainder of the foot
appears intact without fracture or dislocation.
IMPRESSION: 1. There is a subtle lucency involving the dorsal aspect of the
posterior calcaneal cortex. Cannot rule out erosion in this area
secondary to osteomyelitis. Clinical correlation with direct site of
infection is advised. If further imaging is clinically indicated
then a contrast enhanced MRI may be helpful.

## 2018-09-17 IMAGING — CR DG CHEST 2V
2 series · 2 of 2 positions shown · non-contrast
Comparison: 01/04/2018

CLINICAL DATA: Cough

EXAM:
CHEST - 2 VIEW

[w chest pa]
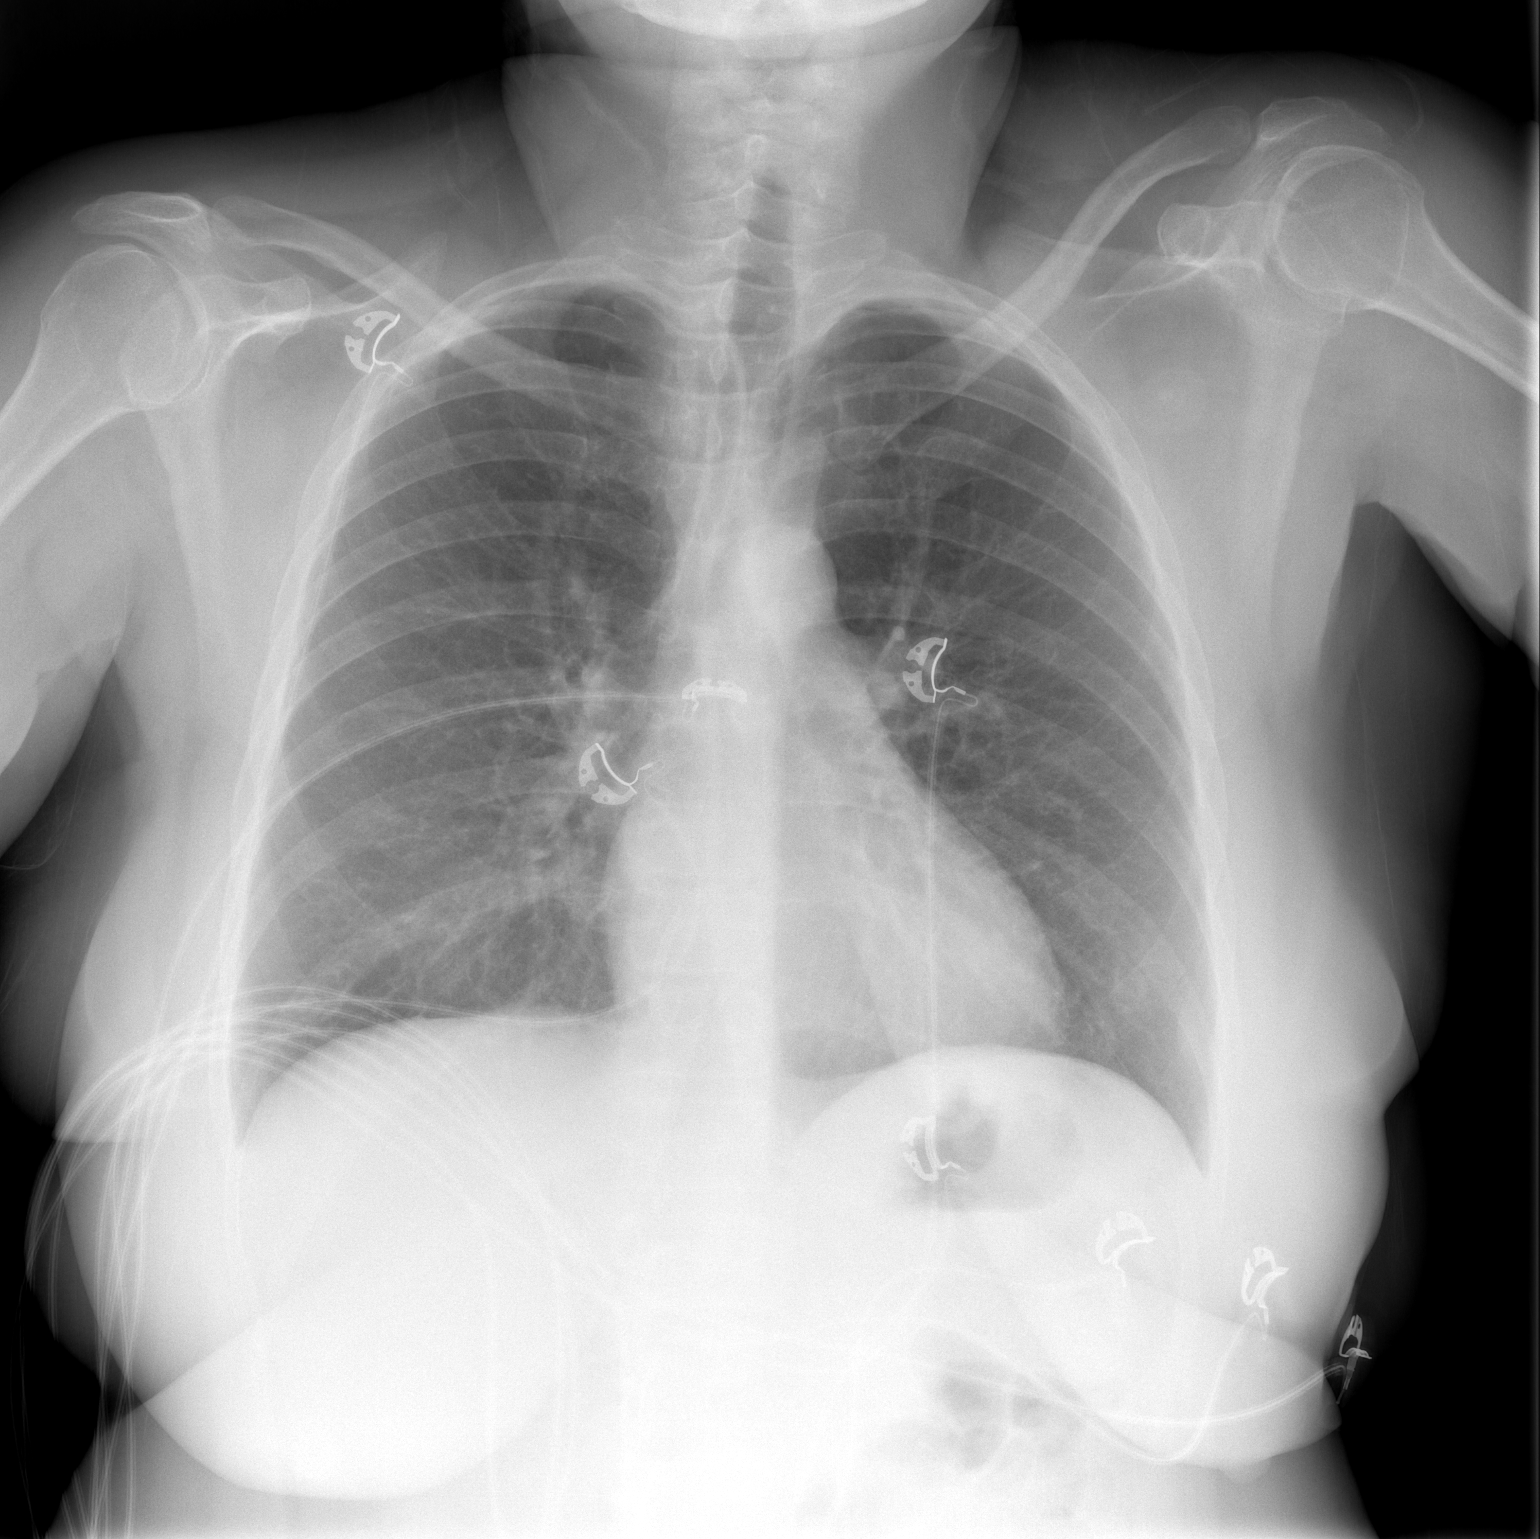

[w chest lat]
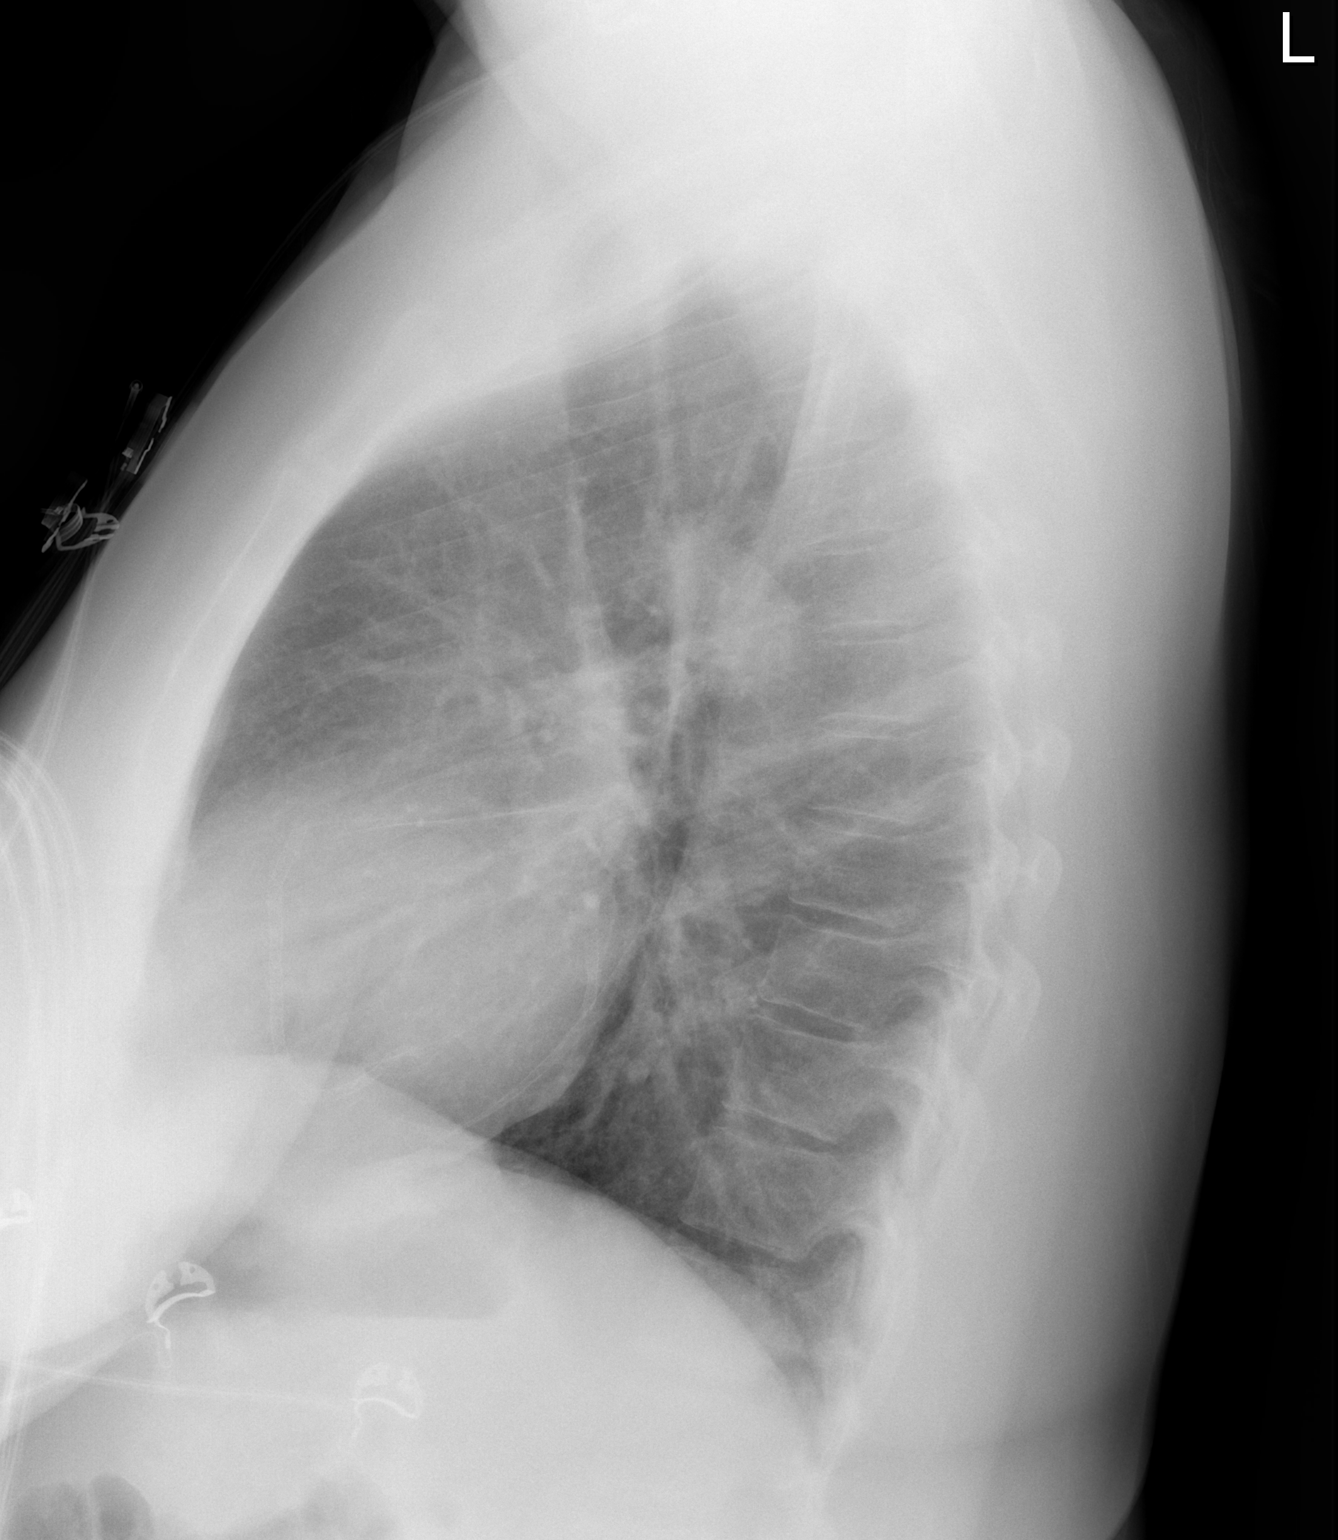

[2 of 2 positions shown; findings below may reference images not displayed]

FINDINGS: Normal heart size, mediastinal contours, and pulmonary vascularity.

Lungs clear.

No pleural effusion or pneumothorax.

Bones unremarkable.
IMPRESSION: Normal exam.

## 2018-09-18 IMAGING — MR MR FOOT*L* W/CM
4 of 9 series · 19 of 40 positions shown · IV contrast (multihance)
Comparison: Radiograph 02/15/2018

CLINICAL DATA: Hindfoot swelling.  Large skin blister.  Diabetic.

EXAM:
MRI OF THE LEFT FOREFOOT WITH CONTRAST
TECHNIQUE: Multiplanar, multisequence MR imaging of the left foot was performed
following the administration of intravenous contrast.
CONTRAST:  18mL MULTIHANCE GADOBENATE DIMEGLUMINE 529 MG/ML IV SOLN

[Series 4: T1 · coronal · 4.0mm · 0.29mm/px · 6 of 30 slices shown (1 of 2)]
[im 1/30]
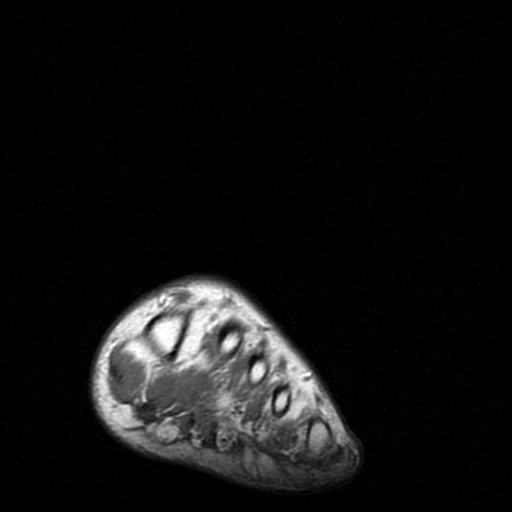
[im 6/30]
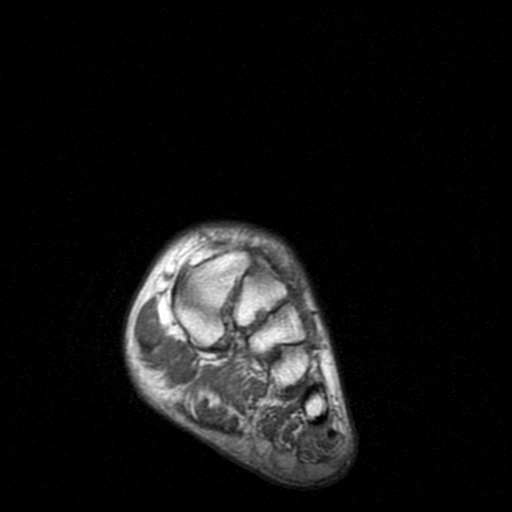
[im 12/30]
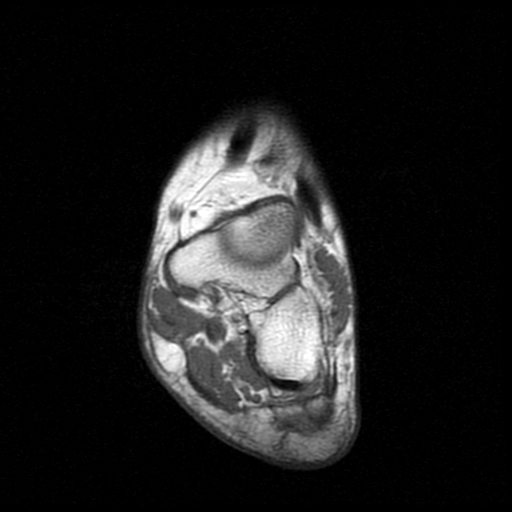
[im 18/30]
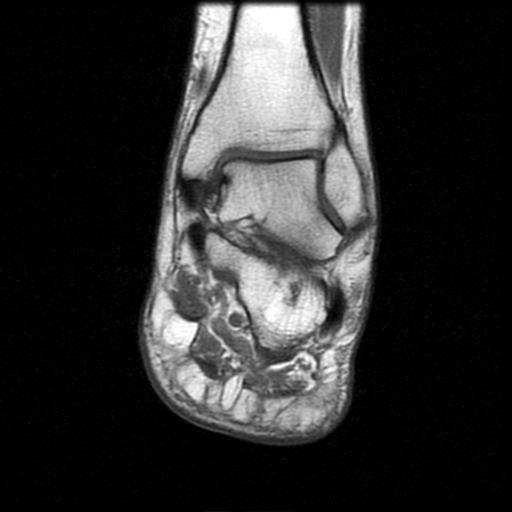
[im 24/30]
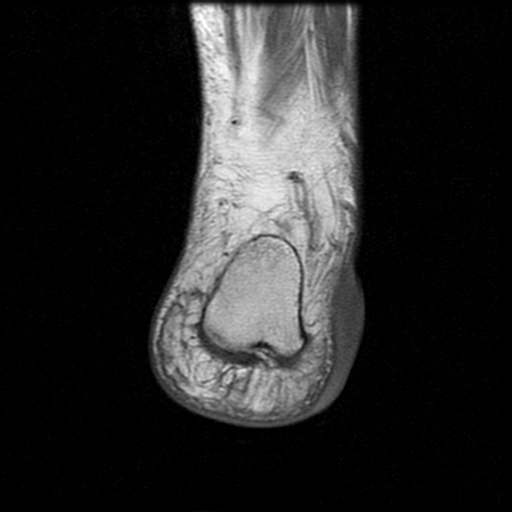
[im 30/30]
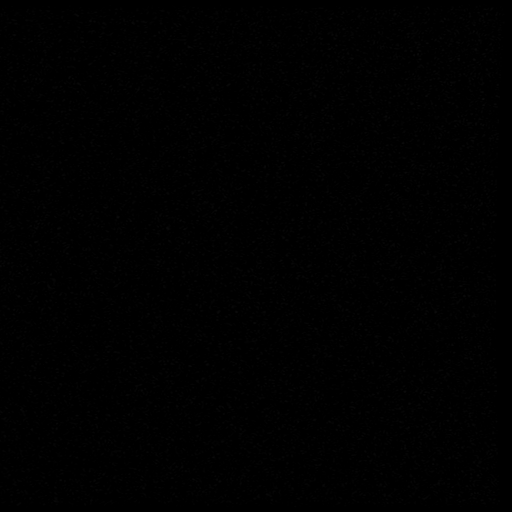

[Series 7: T1 · axial · 4.2mm · 0.33mm/px · z∈[-9,+92]mm · 4 of 21 slices shown (2 of 2)]
[im 1/21]
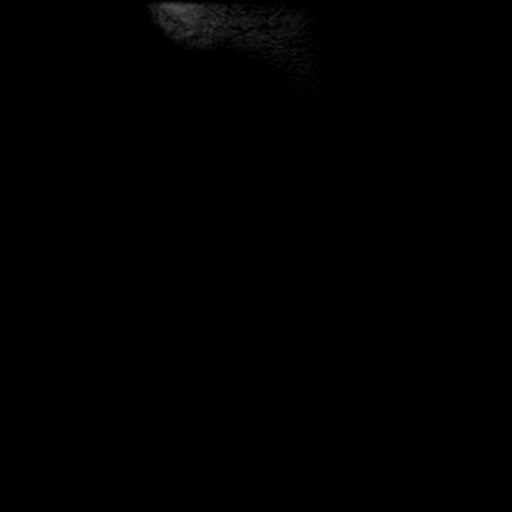
[im 7/21]
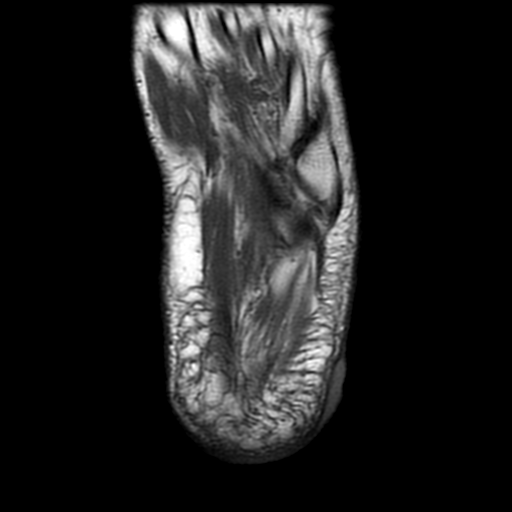
[im 14/21]
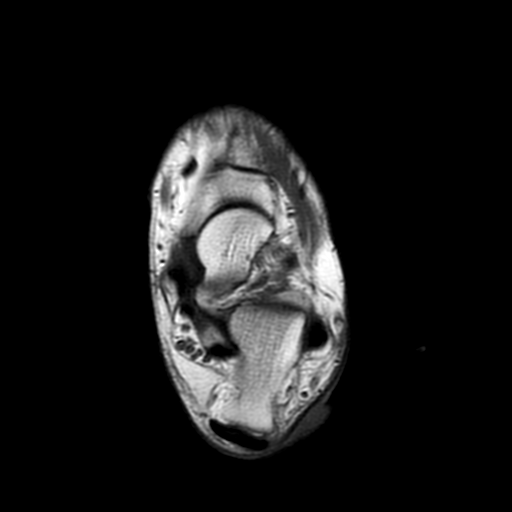
[im 21/21]
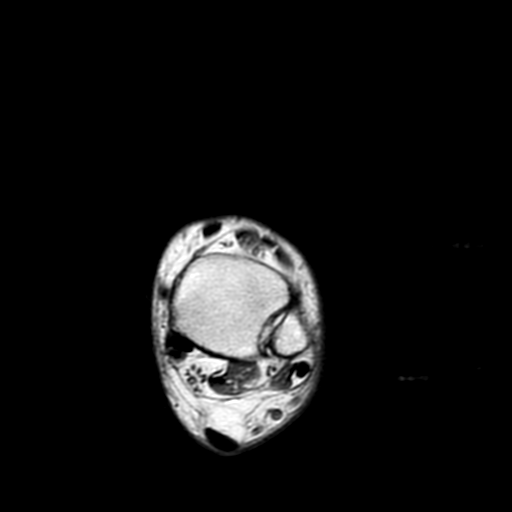

[Series 9: T1 fat-sat post-contrast · coronal · 4.0mm · 0.29mm/px · 5 of 30 slices shown]
[im 1/30]
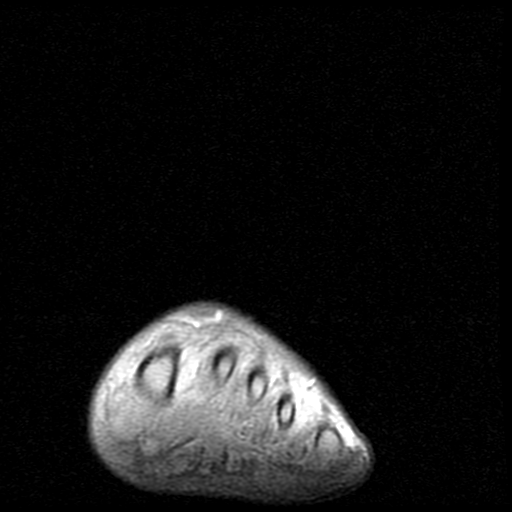
[im 8/30]
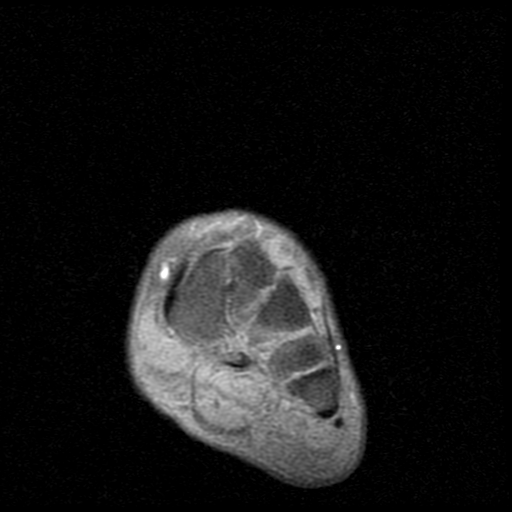
[im 15/30]
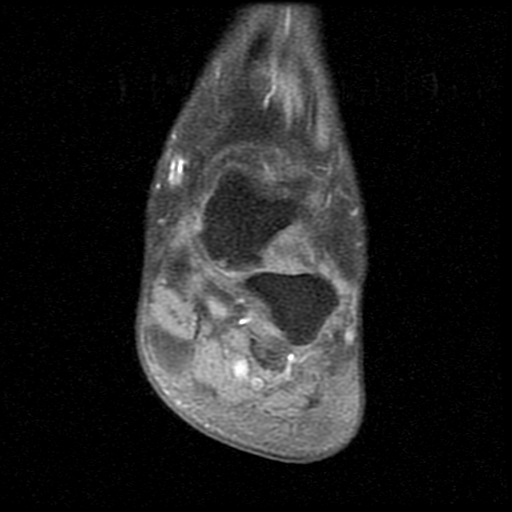
[im 22/30]
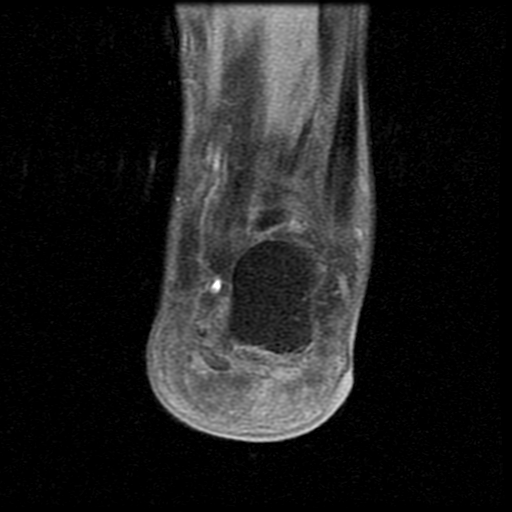
[im 30/30]
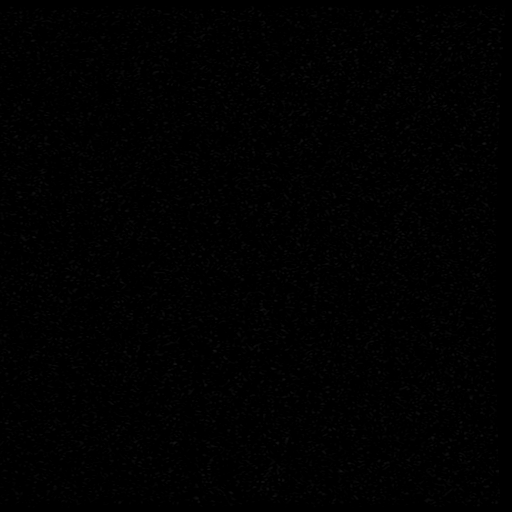

[Series 10: T1 post-contrast · axial · 4.2mm · 0.33mm/px · z∈[-9,+92]mm · 4 of 21 slices shown]
[im 1/21]
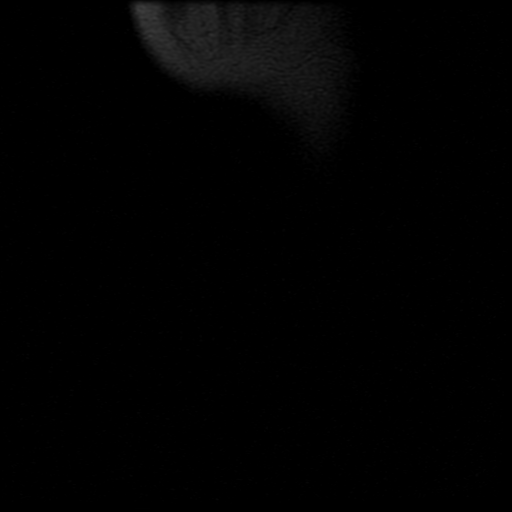
[im 7/21]
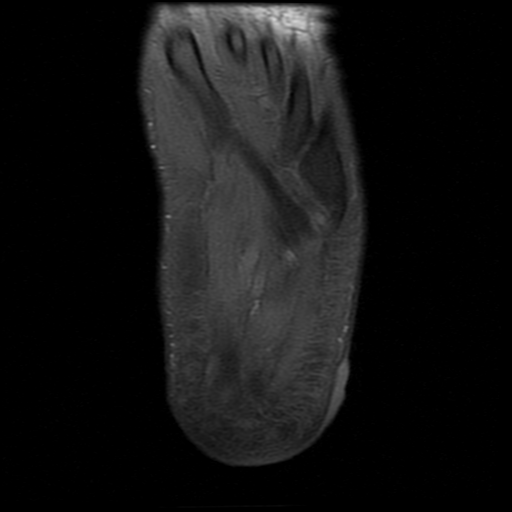
[im 14/21]
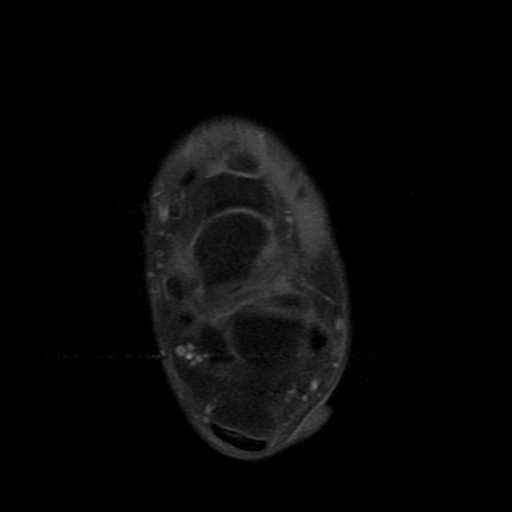
[im 21/21]
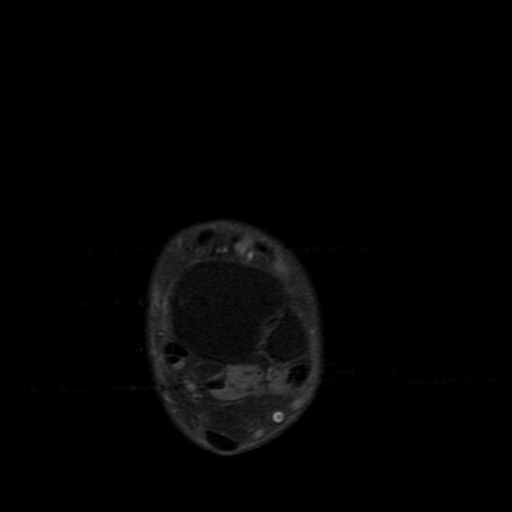

[19 of 40 positions shown; findings below may reference images not displayed]

FINDINGS: There is a large skin blister noted along the lateral and posterior
aspect of the heel also extending down along the plantar surface.
Associated diffuse underlying cellulitis but no discrete rim
enhancing abscess, myofasciitis or pyomyositis.

No findings for septic arthritis or osteomyelitis.

The major ligaments and tendons appear intact.
IMPRESSION: 1. Large skin blister along the lateral and plantar aspect of the
heel but no skin ulceration or evidence of drainable abscess.
2. Cellulitis without findings for myofasciitis, pyomyositis, septic
arthritis or osteomyelitis.

## 2018-09-18 NOTE — Telephone Encounter (Signed)
Patient called and requested referral to Podiatrist.  She has been released from wound care and was informed to go to Podiatrist for her foot care.  Spoke with Dr. Delrae Alfred and she will send referral for patient for Podiatrist.  Dennie Bible called patient and informed her of the same.

## 2018-09-26 ENCOUNTER — Encounter: Payer: Self-pay | Admitting: Internal Medicine

## 2018-09-26 DIAGNOSIS — L97509 Non-pressure chronic ulcer of other part of unspecified foot with unspecified severity: Secondary | ICD-10-CM

## 2018-09-26 DIAGNOSIS — E08621 Diabetes mellitus due to underlying condition with foot ulcer: Secondary | ICD-10-CM

## 2018-09-26 HISTORY — DX: Non-pressure chronic ulcer of other part of unspecified foot with unspecified severity: E08.621

## 2018-09-26 HISTORY — DX: Diabetes mellitus due to underlying condition with foot ulcer: L97.509

## 2018-09-26 NOTE — Telephone Encounter (Signed)
Patient was scheduled for repeat ov

## 2018-09-30 NOTE — Telephone Encounter (Signed)
spoke with patient. Podiatry office had been trying to call patient to set up appointment. Patient given phone number and she will call back to schedule appointment.

## 2018-10-02 ENCOUNTER — Other Ambulatory Visit: Payer: Self-pay | Admitting: Internal Medicine

## 2018-10-04 ENCOUNTER — Ambulatory Visit (INDEPENDENT_AMBULATORY_CARE_PROVIDER_SITE_OTHER): Payer: Medicaid Other | Admitting: Podiatry

## 2018-10-04 ENCOUNTER — Encounter: Payer: Self-pay | Admitting: Podiatry

## 2018-10-04 ENCOUNTER — Telehealth: Payer: Self-pay | Admitting: *Deleted

## 2018-10-04 DIAGNOSIS — M79674 Pain in right toe(s): Secondary | ICD-10-CM | POA: Diagnosis not present

## 2018-10-04 DIAGNOSIS — E13621 Other specified diabetes mellitus with foot ulcer: Secondary | ICD-10-CM | POA: Diagnosis not present

## 2018-10-04 DIAGNOSIS — L989 Disorder of the skin and subcutaneous tissue, unspecified: Secondary | ICD-10-CM | POA: Diagnosis not present

## 2018-10-04 DIAGNOSIS — M79675 Pain in left toe(s): Secondary | ICD-10-CM

## 2018-10-04 DIAGNOSIS — L97429 Non-pressure chronic ulcer of left heel and midfoot with unspecified severity: Secondary | ICD-10-CM

## 2018-10-04 DIAGNOSIS — B351 Tinea unguium: Secondary | ICD-10-CM | POA: Diagnosis not present

## 2018-10-04 DIAGNOSIS — E119 Type 2 diabetes mellitus without complications: Secondary | ICD-10-CM

## 2018-10-04 MED ORDER — CICLOPIROX 8 % EX SOLN: Freq: Every day | CUTANEOUS | 2 refills | Status: DC

## 2018-10-04 NOTE — Progress Notes (Signed)
Subjective:    Patient ID: [name] [name], female    DOB: 1965/11/09, 53 y.o.   MRN: [id number]  HPI 53 year old female presents the office today for concerns of thick, elongated toenails that she cause pain at times.  She also has noticed that she gets some dry skin to her feet and a callus formed on her left foot.  She does have a wound in the left ankle.  She had previously been seen by the wound care center for this and the wound did heal however she has noticed it starting to open back up.  Since that she is gone back to using the Silvadene cream that she was using previously.  She denies any drainage or pus or any swelling or redness or any red streaks.  No pain.  Has no other concerns.   Review of Systems  All other systems reviewed and are negative.  Past Medical History:  Diagnosis Date  . Acute myocardial infarction of other lateral wall, initial episode of care   . Acute myocardial infarction, unspecified site, initial episode of care   . Acute osteomyelitis   . Allergic rhinitis   . Anemia   . Anginal pain (HCC)    07/15/13- no chest pain in months"  . Anxiety   . Bipolar affective (HCC)   . CAD (coronary artery disease) 07/2011   s/p DES mid and distal RCA with 50% LAD  . Daily headache    not daily  . Depression    Bipolar disorder  . Diabetic foot ulcer associated with diabetes mellitus due to underlying condition (HCC) 09/26/2018  . Diabetic gastroparesis associated with type 2 diabetes mellitus (HCC)   . Diabetic peripheral neuropathy associated with type 2 diabetes mellitus (HCC)   . Diabetic retinopathy   . Esophageal stenosis   . Esophageal ulcer   . Esophagitis   . Gastroparesis   . Genital herpes    Reportedly tested and documented by [facility]--rare occurrences  . GERD (gastroesophageal reflux disease)   . Heart murmur   . History of stomach ulcers   . Hyperlipidemia   . Hypertension   . 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 Migraines   . Orthopnoea   . Pneumonia 2012  . Polysubstance abuse (HCC)    Crack cocaine--none since 2008, MJ, ETOH:  clean of all since 2008  . Renal insufficiency   . Rheumatoid arthritis(714.0)   . Sebaceous cyst   . Stroke [facility]) 2011   denies residual on 12/19/2012.  "Years ago"  . Type II diabetes mellitus (HCC)    Previously uncontrolled for many years with multiple complications.  2017 controlled. 05/2016:  6.7%    Past Surgical History:  Procedure Laterality Date  . CORONARY ANGIOPLASTY WITH STENT PLACEMENT  01/20/2009   "2" (12/19/2012)  . CORONARY ANGIOPLASTY WITH STENT PLACEMENT  2012   "2" (12/19/2012)  . CORONARY ANGIOPLASTY WITH STENT PLACEMENT  12/19/2012   "2" (12/19/2012)  . ESOPHAGOGASTRODUODENOSCOPY N/A 02/26/2015   Procedure: ESOPHAGOGASTRODUODENOSCOPY (EGD);  Surgeon: Dorena Cookey, MD;  Location: [facility] ENDOSCOPY;  Service: Endoscopy;  Laterality: N/A;  . EYE SURGERY     Multiple surgeries of both eyes:  last laser was 09/08/2015 of right eye.  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 GAS INSERTION Left 07/16/2013   Procedure: INSERTION OF GAS;  Surgeon: Shade Flood, MD;  Location: [facility] OR;  Service:  Ophthalmology;  Laterality: Left;  SF6  . GAS/FLUID EXCHANGE Left 07/30/2013   Procedure: GAS/FLUID EXCHANGE;  Surgeon: Shade Flood, MD;  Location: [facility] OR;  Service: Ophthalmology;  Laterality: Left;  . IRRIGATION AND DEBRIDEMENT SEBACEOUS CYST Right 03/2011   "pointer" (12/19/2012)  . LEFT HEART CATHETERIZATION WITH CORONARY ANGIOGRAM N/A 07/06/2011   Procedure: LEFT HEART CATHETERIZATION WITH CORONARY ANGIOGRAM;  Surgeon: Corky Crafts, MD;  Location: [facility] CATH LAB;  Service: Cardiovascular;  Laterality: N/A;  possible PCI  . LEFT HEART CATHETERIZATION WITH CORONARY ANGIOGRAM N/A 12/19/2012   Procedure: LEFT HEART CATHETERIZATION WITH CORONARY ANGIOGRAM;  Surgeon: Corky Crafts, MD;  Location: [facility] CATH LAB;   Service: Cardiovascular;  Laterality: N/A;  . MEMBRANE PEEL Left 07/16/2013   Procedure: MEMBRANE PEEL;  Surgeon: Shade Flood, MD;  Location: [facility] OR;  Service: Ophthalmology;  Laterality: Left;  . PARS PLANA VITRECTOMY Left 07/16/2013   Procedure: PARS PLANA VITRECTOMY WITH 23 GAUGE;  Surgeon: Shade Flood, MD;  Location: [facility] OR;  Service: Ophthalmology;  Laterality: Left;  . PARS PLANA VITRECTOMY Left 07/30/2013   Procedure: PARS PLANA VITRECTOMY WITH 23 GAUGE WITH ENDOLASER;  Surgeon: Shade Flood, MD;  Location: [facility] OR;  Service: Ophthalmology;  Laterality: Left;  with endolaser  . PERCUTANEOUS CORONARY STENT INTERVENTION (PCI-S) N/A 07/06/2011   Procedure: PERCUTANEOUS CORONARY STENT INTERVENTION (PCI-S);  Surgeon: Corky Crafts, MD;  Location: [facility] CATH LAB;  Service: Cardiovascular;  Laterality: N/A;  . PHOTOCOAGULATION WITH LASER Left 07/16/2013   Procedure: PHOTOCOAGULATION WITH LASER;  Surgeon: Shade Flood, MD;  Location: [facility] OR;  Service: Ophthalmology;  Laterality: Left;  ENDOLASER     Current Outpatient Medications:  .  acetaminophen (TYLENOL) 500 MG tablet, Take 500-1,000 mg by mouth every 6 (six) hours as needed for moderate pain. , Disp: , Rfl:  .  albuterol (PROVENTIL HFA;VENTOLIN HFA) 108 (90 Base) MCG/ACT inhaler, Inhale 2 puffs into the lungs every 6 (six) hours as needed for wheezing or shortness of breath., Disp: 1 Inhaler, Rfl: 0 .  ciclopirox (PENLAC) 8 % solution, Apply topically at bedtime. Apply over nail and surrounding skin. Apply daily over previous coat. After seven (7) days, may remove with alcohol and continue cycle., Disp: 6.6 mL, Rfl: 2 .  clopidogrel (PLAVIX) 75 MG tablet, Take 1 tablet (75 mg total) by mouth daily., Disp: 90 tablet, Rfl: 3 .  cyclobenzaprine (FLEXERIL) 10 MG tablet, Take 1-2 tablets by mouth at bedtime as needed. Muscle spasms, Disp: , Rfl: 5 .  folic acid (FOLVITE) 1 MG tablet, Take 1 mg by mouth daily., Disp: , Rfl:  .  furosemide (LASIX)  40 MG tablet, Take 1 tablet (40 mg total) by mouth every morning., Disp: 30 tablet, Rfl: 11 .  Incontinence Supply Disposable (POISE ULTRA THINS) PADS, Use 5 pads daily as needed for urinary stress incontinence, Disp: 150 each, Rfl: 11 .  insulin aspart (NOVOLOG) 100 UNIT/ML injection, Inject 4-16 Units into the skin 3 (three) times daily with meals. Sliding scale CBG 100-150; 4 units, 151-200; 6 units, 201-250; 8 units, 251-300; 10 units, 301-350; 12 units, 351-400; 14 units, > 401; call doctor, Disp: , Rfl:  .  LANTUS SOLOSTAR 100 UNIT/ML Solostar Pen, Inject 10 Units into the skin 2 (two) times daily. , Disp: , Rfl: 3 .  methotrexate (RHEUMATREX) 2.5 MG tablet, 3 tabs by mouth twice weekly (Patient not taking: Reported on 08/20/2018), Disp: 40 tablet, Rfl: 1 .  metoCLOPramide (REGLAN) 10 MG tablet, Take 0.5 tablets (5 mg  total) by mouth 4 (four) times daily -  before meals and at bedtime. (Patient not taking: Reported on 08/12/2018), Disp: 60 tablet, Rfl: 11 .  metoprolol tartrate (LOPRESSOR) 25 MG tablet, Take 1 tablet (25 mg total) by mouth 2 (two) times daily., Disp: 60 tablet, Rfl: 11 .  nitroGLYCERIN (NITROSTAT) 0.4 MG SL tablet, PLACE 1 TABLET (0.4 MG TOTAL) UNDER THE TONGUE EVERY 5 (FIVE) MINUTES AS NEEDED FOR CHEST PAIN., Disp: 25 tablet, Rfl: 1 .  ondansetron (ZOFRAN) 4 MG tablet, Take 1 tablet (4 mg total) by mouth every 6 (six) hours., Disp: 12 tablet, Rfl: 0 .  pantoprazole (PROTONIX) 40 MG tablet, Take 1 tablet (40 mg total) by mouth daily., Disp: 30 tablet, Rfl: 1 .  prednisoLONE acetate (PRED FORTE) 1 % ophthalmic suspension, Place 4-5 drops into the left eye daily., Disp: , Rfl: 6 .  PROCTOSOL HC 2.5 % rectal cream, Place 1 application rectally 2 (two) times daily. , Disp: , Rfl: 1 .  rosuvastatin (CRESTOR) 40 MG tablet, Take 1 tablet (40 mg total) by mouth daily., Disp: 90 tablet, Rfl: 3 .  sertraline (ZOLOFT) 100 MG tablet, 1 tab by mouth daily (Patient taking differently: Take 100 mg  by mouth daily. ), Disp: 30 tablet, Rfl: 11 .  sulfamethoxazole-trimethoprim (BACTRIM DS,SEPTRA DS) 800-160 MG tablet, 1 tab by mouth twice daily for 10 days., Disp: 20 tablet, Rfl: 0 .  sulfamethoxazole-trimethoprim (BACTRIM DS,SEPTRA DS) 800-160 MG tablet, Add 5 more days with 1 tab by mouth twice daily., Disp: 10 tablet, Rfl: 0 .  traMADol (ULTRAM) 50 MG tablet, TAKE 1 TABLET EVERY 6 HOURS AS NEEDED FOR MODERATE PAIN (Patient taking differently: Take 50 mg by mouth every 6 (six) hours as needed for moderate pain. ), Disp: 90 tablet, Rfl: 2  Allergies  Allergen Reactions  . Aspirin Other (See Comments)    Stomach bleeds Stomach bleeds GI bleeding Other reaction(s): Other (See Comments) GI bleeding Stomach bleeds         Objective:   Physical Exam  General: AAO x3, NAD  Dermatological: Nails are hypertrophic, dystrophic, brittle, discolored, elongated 10. No surrounding redness or drainage. Tenderness nails 1-5 bilaterally.  On the lateral aspect of the heel there is a superficial appearing wound measuring 2.1 cm.  There is no probing, undermining or tunneling.  There is no surrounding erythema, ascending cellulitis.  No fluctuation or crepitation malodor.  Has a pink base.  Hyperkeratotic lesion left foot submetatarsal 5.  Upon debridement no underlying ulceration drainage or any signs of infection.  No open lesions or pre-ulcerative lesions are identified today.       Vascular: Dorsalis Pedis artery and Posterior Tibial artery pedal pulses are 2/4 bilateral with immedate capillary fill time. There is no pain with calf compression, swelling, warmth, erythema.   Neruologic: Grossly intact via light touch bilateral.  Protective threshold with Semmes Wienstein monofilament intact to all pedal sites bilateral.  Musculoskeletal: No gross boney pedal deformities bilateral. No pain, crepitus, or limitation noted with foot and ankle range of motion bilateral. Muscular strength 5/5 in  all groups tested bilateral.  Gait: Unassisted, Nonantalgic.     Assessment & Plan:  53 year old female with symptomatic onychomycosis, left heel ulceration -Treatment options discussed including all alternatives, risks, and complications -Etiology of symptoms were discussed -Nails debrided x10 without any complications or bleeding.  I prescribed Penlac for nail fungus treatment.  Discussed side effects, success rates as well as application use. -Debrided the hyperkeratotic tissue without any  complications or bleeding. -Continue Silvadene for the wound in the left heel.  Monitor for any signs or symptoms of infection.  Return in about 2 weeks (around 10/18/2018).  For wound check  Vivi Barrack DPM

## 2018-10-04 NOTE — Telephone Encounter (Signed)
Left message ordering Penlac as prescribed 10/04/2018.

## 2018-10-04 NOTE — Patient Instructions (Signed)
Keep silvadene on the wound to the left heel daily. Monitor for any signs/symptoms of infection. Call the office immediately if any occur or go directly to the emergency room. Call with any questions/concerns.  If was nice to meet you today. If you have any questions or any further concerns, please feel fee to give me a call. You can call our office at [phone] or please feel fee to send me a message through MyChart.

## 2018-10-07 ENCOUNTER — Encounter: Payer: Self-pay | Admitting: Internal Medicine

## 2018-10-07 ENCOUNTER — Ambulatory Visit: Payer: Medicaid Other | Admitting: Internal Medicine

## 2018-10-07 VITALS — BP 136/78 | HR 80 | Resp 12 | Ht 61.0 in | Wt 162.0 lb

## 2018-10-07 DIAGNOSIS — B86 Scabies: Secondary | ICD-10-CM | POA: Diagnosis not present

## 2018-10-07 MED ORDER — PERMETHRIN 5 % EX CREA: TOPICAL_CREAM | CUTANEOUS | 1 refills | Status: DC

## 2018-10-07 MED ORDER — PERMETHRIN 5 % EX CREA: TOPICAL_CREAM | CUTANEOUS | 0 refills | Status: DC

## 2018-10-07 NOTE — Progress Notes (Signed)
Subjective:    Patient ID: [name] [name], female   DOB: October 31, 1965, 53 y.o.   MRN: [id number]   HPI   Small bumps on legs starting 2 weeks ago and into her groin area.  No lesions on her arms, interdigital area of hands, no abdominal or trunk lesions.   She feels this actually started in her groin area and then spread.   She also states she has lesions on her scalp.   States they actually started out just itching and then became painful from scratching. Nothing comes from the lesions.   She treats for fleas with her dog in the home on a monthly basis.   She cannot think of any new exposures. Her boyfriend and son deny having any rashes.  She sleeps in same bed as boyfriend. Lawanna Kobus, her friend stopped coming over as he was bringing in [place].  She has not checked with him as to whether he is having similar rashes.   She has recently had an exterminator come to her home to treat both roaches and bed bugs.  She was told they were eradicated.   Current Meds  Medication Sig  . acetaminophen (TYLENOL) 500 MG tablet Take 500-1,000 mg by mouth every 6 (six) hours as needed for moderate pain.   Marland Kitchen albuterol (PROVENTIL HFA;VENTOLIN HFA) 108 (90 Base) MCG/ACT inhaler Inhale 2 puffs into the lungs every 6 (six) hours as needed for wheezing or shortness of breath.  . ciclopirox (PENLAC) 8 % solution Apply topically at bedtime. Apply over nail and surrounding skin. Apply daily over previous coat. After seven (7) days, may remove with alcohol and continue cycle.  . clopidogrel (PLAVIX) 75 MG tablet Take 1 tablet (75 mg total) by mouth daily.  . cyclobenzaprine (FLEXERIL) 10 MG tablet Take 1-2 tablets by mouth at bedtime as needed. Muscle spasms  . folic acid (FOLVITE) 1 MG tablet Take 1 mg by mouth daily.  . furosemide (LASIX) 40 MG tablet Take 1 tablet (40 mg total) by mouth every morning.  . Incontinence Supply Disposable (POISE ULTRA THINS) PADS Use 5 pads daily as needed for urinary stress  incontinence  . insulin aspart (NOVOLOG) 100 UNIT/ML injection Inject 4-16 Units into the skin 3 (three) times daily with meals. Sliding scale CBG 100-150; 4 units, 151-200; 6 units, 201-250; 8 units, 251-300; 10 units, 301-350; 12 units, 351-400; 14 units, > 401; call doctor  . LANTUS SOLOSTAR 100 UNIT/ML Solostar Pen Inject 10 Units into the skin 2 (two) times daily.   . metoprolol tartrate (LOPRESSOR) 25 MG tablet Take 1 tablet (25 mg total) by mouth 2 (two) times daily.  . nitroGLYCERIN (NITROSTAT) 0.4 MG SL tablet PLACE 1 TABLET (0.4 MG TOTAL) UNDER THE TONGUE EVERY 5 (FIVE) MINUTES AS NEEDED FOR CHEST PAIN.  Marland Kitchen pantoprazole (PROTONIX) 40 MG tablet Take 1 tablet (40 mg total) by mouth daily.  . prednisoLONE acetate (PRED FORTE) 1 % ophthalmic suspension Place 4-5 drops into the left eye daily.  . rosuvastatin (CRESTOR) 40 MG tablet Take 1 tablet (40 mg total) by mouth daily.  . sertraline (ZOLOFT) 100 MG tablet 1 tab by mouth daily (Patient taking differently: Take 100 mg by mouth daily. )  . traMADol (ULTRAM) 50 MG tablet TAKE 1 TABLET EVERY 6 HOURS AS NEEDED FOR MODERATE PAIN (Patient taking differently: Take 50 mg by mouth every 6 (six) hours as needed for moderate pain. )   Allergies  Allergen Reactions  . Aspirin Other (See Comments)  Stomach bleeds Stomach bleeds GI bleeding Other reaction(s): Other (See Comments) GI bleeding Stomach bleeds      Review of Systems    Objective:   BP 136/78 (BP Location: Left Arm, Patient Position: Sitting, Cuff Size: Normal)   Pulse 80   Resp 12   Ht 5\' 1"  (1.549 m)   Wt 162 lb (73.5 kg)   LMP 01/12/2015   BMI 30.61 kg/m   Physical Exam  NAD Red 2-3 mm papules scattered all over scalp.  Hard and many with dried overlying flaking similar sized hyperpigmented lesions mainly on legs with possibly umbilication, though may just be due to these being perifollicular lesions.   Has a couple lesions on upper arm and lower arm on right as  well as lesions that appear to be scratched off on lower abdomen. No interdigital or hand lesions.  No other lesions on trunk.   Assessment & Plan   Possible scabies vs molluscum contagiosum or other rash/bites.  Treat scabies first. Her boyfriend and son will need treatment.   To have her dog checked as well.   Elimite 5% to be applied to scalp and body tonight after shower.   In morning, to wash all bedclothes/sheets/clothes/etc in hot water and dry. Shower off elimite. Repeat the whole thing in 7 days

## 2018-10-07 NOTE — Addendum Note (Signed)
Addended by: Marcene Duos on: 10/07/2018 03:46 PM   Modules accepted: Orders

## 2018-10-07 NOTE — Patient Instructions (Signed)
After shower when hair still damp, massage Elimite into scalp and coat all of body, including palms and bottoms of feet.  Avoid eyes, nose mouth. Wash everything in the morning as we discussed then shower off elimite.   Your boyfriend and son should get treated on same night. Everyone needs retreatment in 7 days.

## 2018-10-09 ENCOUNTER — Telehealth: Payer: Self-pay | Admitting: Internal Medicine

## 2018-10-09 ENCOUNTER — Other Ambulatory Visit: Payer: Self-pay | Admitting: Internal Medicine

## 2018-10-09 NOTE — Telephone Encounter (Signed)
Patient called requesting Rx on Ondansetron (ZOFRAN) 4 MG tablet. Medication to be sent to [facility] at [facility].  Please advise.

## 2018-10-10 ENCOUNTER — Other Ambulatory Visit: Payer: Medicaid Other | Admitting: Internal Medicine

## 2018-10-10 ENCOUNTER — Other Ambulatory Visit: Payer: Self-pay

## 2018-10-10 DIAGNOSIS — F439 Reaction to severe stress, unspecified: Secondary | ICD-10-CM

## 2018-10-10 MED ORDER — ONDANSETRON HCL 4 MG PO TABS: 4.0000 mg | ORAL_TABLET | Freq: Four times a day (QID) | ORAL | 0 refills | Status: DC

## 2018-10-10 NOTE — Telephone Encounter (Signed)
Rx sent to pharmacy   

## 2018-10-11 ENCOUNTER — Telehealth: Payer: Self-pay | Admitting: Podiatry

## 2018-10-11 NOTE — Telephone Encounter (Signed)
I called pt and asked if she needed the instruction for the wound on her left heel or the toenails. Pt states she needs the instructions for the toenails. I informed pt she should put the polish on the affected toenails daily then remove it once a week, then begin the polish process again. Pt states understanding.

## 2018-10-11 NOTE — Telephone Encounter (Signed)
Called and spoke with the patient and patient stated that she did get the instructions on how to apply the Penlac and I stated that if you needed anything else just let me know and to call the [place] office at [phone]. Misty Stanley

## 2018-10-11 NOTE — Telephone Encounter (Signed)
Pt was seen in office 10/04/18 for wound on left foot and was given a solution to put on the wound but the patient states she threw the instructions away and is unsure of how to use the prescription. Please give patient a call back with clarification on directions.

## 2018-10-11 NOTE — Telephone Encounter (Signed)
I'm calling because I need to know how to use the solution that was prescribed for me.

## 2018-10-21 ENCOUNTER — Ambulatory Visit: Payer: Medicaid Other | Admitting: Podiatry

## 2018-10-21 ENCOUNTER — Other Ambulatory Visit: Payer: Self-pay

## 2018-10-24 ENCOUNTER — Ambulatory Visit: Payer: Medicaid Other | Admitting: Internal Medicine

## 2018-10-24 ENCOUNTER — Other Ambulatory Visit: Payer: Self-pay

## 2018-10-24 ENCOUNTER — Encounter: Payer: Self-pay | Admitting: Internal Medicine

## 2018-10-24 VITALS — BP 124/72 | HR 76 | Resp 12 | Ht 61.0 in | Wt 159.0 lb

## 2018-10-24 DIAGNOSIS — F439 Reaction to severe stress, unspecified: Secondary | ICD-10-CM | POA: Diagnosis not present

## 2018-10-24 DIAGNOSIS — B081 Molluscum contagiosum: Secondary | ICD-10-CM | POA: Diagnosis not present

## 2018-10-24 MED ORDER — ZYMADERM EX SOLN: 1.0000 "application " | Freq: Two times a day (BID) | CUTANEOUS | 0 refills | Status: DC

## 2018-10-24 NOTE — Progress Notes (Signed)
Subjective:    Patient ID: [name] [name], female   DOB: 11/12/1965, 53 y.o.   MRN: [id number]   HPI   Here for rash that has not improved with treatment for scabies.   States she performed treatment with Elimite 5% twice, one week apart and her rash has not resolved and in fact she has new lesions on on upper arms. She did have some lesions on right arm last visit.   She states the other two in household treated with one treatment.  No one else ever with a rash . She does sleep in same bed as boyfriend  The lesions are mildly itchy. The ones on her scalp have decreased.    Current Meds  Medication Sig  . acetaminophen (TYLENOL) 500 MG tablet Take 500-1,000 mg by mouth every 6 (six) hours as needed for moderate pain.   Marland Kitchen albuterol (PROVENTIL HFA;VENTOLIN HFA) 108 (90 Base) MCG/ACT inhaler Inhale 2 puffs into the lungs every 6 (six) hours as needed for wheezing or shortness of breath.  . ciclopirox (PENLAC) 8 % solution Apply topically at bedtime. Apply over nail and surrounding skin. Apply daily over previous coat. After seven (7) days, may remove with alcohol and continue cycle.  . clopidogrel (PLAVIX) 75 MG tablet Take 1 tablet (75 mg total) by mouth daily.  . cyclobenzaprine (FLEXERIL) 10 MG tablet Take 1-2 tablets by mouth at bedtime as needed. Muscle spasms  . folic acid (FOLVITE) 1 MG tablet Take 1 mg by mouth daily.  . furosemide (LASIX) 40 MG tablet Take 1 tablet (40 mg total) by mouth every morning.  . Incontinence Supply Disposable (POISE ULTRA THINS) PADS Use 5 pads daily as needed for urinary stress incontinence  . insulin aspart (NOVOLOG) 100 UNIT/ML injection Inject 4-16 Units into the skin 3 (three) times daily with meals. Sliding scale CBG 100-150; 4 units, 151-200; 6 units, 201-250; 8 units, 251-300; 10 units, 301-350; 12 units, 351-400; 14 units, > 401; call doctor  . LANTUS SOLOSTAR 100 UNIT/ML Solostar Pen Inject 10 Units into the skin 2 (two) times daily.   . metoprolol  tartrate (LOPRESSOR) 25 MG tablet Take 1 tablet (25 mg total) by mouth 2 (two) times daily.  . nitroGLYCERIN (NITROSTAT) 0.4 MG SL tablet PLACE 1 TABLET (0.4 MG TOTAL) UNDER THE TONGUE EVERY 5 (FIVE) MINUTES AS NEEDED FOR CHEST PAIN.  Marland Kitchen ondansetron (ZOFRAN) 4 MG tablet Take 1 tablet (4 mg total) by mouth every 6 (six) hours.  . pantoprazole (PROTONIX) 40 MG tablet Take 1 tablet (40 mg total) by mouth daily.  . prednisoLONE acetate (PRED FORTE) 1 % ophthalmic suspension Place 4-5 drops into the left eye daily.  . rosuvastatin (CRESTOR) 40 MG tablet Take 1 tablet (40 mg total) by mouth daily.  . sertraline (ZOLOFT) 100 MG tablet 1 tab by mouth daily (Patient taking differently: Take 100 mg by mouth daily. )  . traMADol (ULTRAM) 50 MG tablet TAKE 1 TABLET EVERY 6 HOURS AS NEEDED FOR MODERATE PAIN (Patient taking differently: Take 50 mg by mouth every 6 (six) hours as needed for moderate pain. )   Allergies  Allergen Reactions  . Aspirin Other (See Comments)    Stomach bleeds Stomach bleeds GI bleeding Other reaction(s): Other (See Comments) GI bleeding Stomach bleeds      Review of Systems    Objective:   BP 124/72 (BP Location: Left Arm, Patient Position: Sitting, Cuff Size: Normal)   Pulse 76   Resp 12  Ht 5\' 1"  (1.549 m)   Wt 159 lb (72.1 kg)   LMP 01/12/2015   BMI 30.04 kg/m   Physical Exam NAD Dry hyperpigmented 3-5 mm raised lesions with what appear to have central umbilication for most part today.  The umbilication on most is dry.  Unable to express a core.  I did de roof one on her right arm, but unable to definitely see a central white core due to bleeding. These lesions mainly involving legs--all over.  Somewhat on arms.  One lesion on left scalp.  None currently on thorax.    Assessment & Plan   Likely at this point with history and appearance and lack of response to Elimite not to be scabies.   There does not appear to be much change in current lesions other  than a more prominent umbilication With coronavirus, I will be unlikely to get her into Dermatology.  Will treat for what now appears to be more likely Molluscum contagiosum with Zymaderm liquid twice daily to skin lesions.

## 2018-10-24 NOTE — Progress Notes (Signed)
   THERAPY PROGRESS NOTE  Session Time: 60 minutes  Participation Level: Active  Behavioral Response: NAAlertIrritable  Type of Therapy: Individual Therapy  Treatment Goals addressed: Coping  Interventions: Supportive  Summary: [name] [name] is a 53 y.o. female who presents with a euthymic mood and appropriate affect. [name] described the stress that living with the pandemic has caused her. She disclosed that certain situations have irritated her such as being told to wait outside the building during an appointment with her foot doctor. She revealed that she wished the receptionist had explained this before she got there, and that the situation made her so irritable that she rescheduled the appointment. She explained that the CoVid-19 crisis has shown her who she can really depend on. Within her supportive network she included her daughter, her nephew, her sister, and two friends. [name] reported that she has been drinking tea to calm her nerves and that she has been trying to eat healthy foods. She is still concerned about her rash which is very annoying but not life-threatening. She wondered if increasing her anti-depressant would be beneficial to help her with the added stress in her life. She also questioned the efficacy of her arthritis medication since the side-effects are unpleasant.   Therapist's Response 10/24/2018  The social work intern (SWI) listened empathically as [name] spoke about the stress and uncertainty in her life due to the Coronavirus outbreak. The SWI asked about her social support  and validated her interdependence with friends and family during this time of crisis. The SWI validated [name]'s choice to practice social distancing due to her underlying health conditions. The SWI also asked what [name] is doing to soothe herself and validated her choices which include drinking calming tea and eating healthily. The SWI listened to [name]'s inquiry and concern about her medication (anti-depressant and  arthritis medication) and let her know that she would check with Dr. Delrae Alfred about the inquiry and concern. The counseling session was by telephone to prevent any possibility of Coronavirus transmission.   Suicidal/Homicidal: Nowithout intent/plan    Plan: Return again in 2 weeks.    Prentiss Bells, Student-Social Work 10/24/2018

## 2018-10-28 ENCOUNTER — Other Ambulatory Visit: Payer: Self-pay | Admitting: Interventional Cardiology

## 2018-10-29 NOTE — Progress Notes (Signed)
Discussed with Almira Coaster.  No change to medication for now.  To continue to work on Product manager.

## 2018-10-31 ENCOUNTER — Ambulatory Visit: Payer: Medicaid Other

## 2018-11-10 ENCOUNTER — Other Ambulatory Visit: Payer: Self-pay | Admitting: Internal Medicine

## 2018-11-12 IMAGING — DX DG CHEST 2V
2 series · 2 of 2 positions shown · non-contrast
Comparison: 02/15/2018

CLINICAL DATA: Chest pain

EXAM:
CHEST - 2 VIEW

[chest pa]
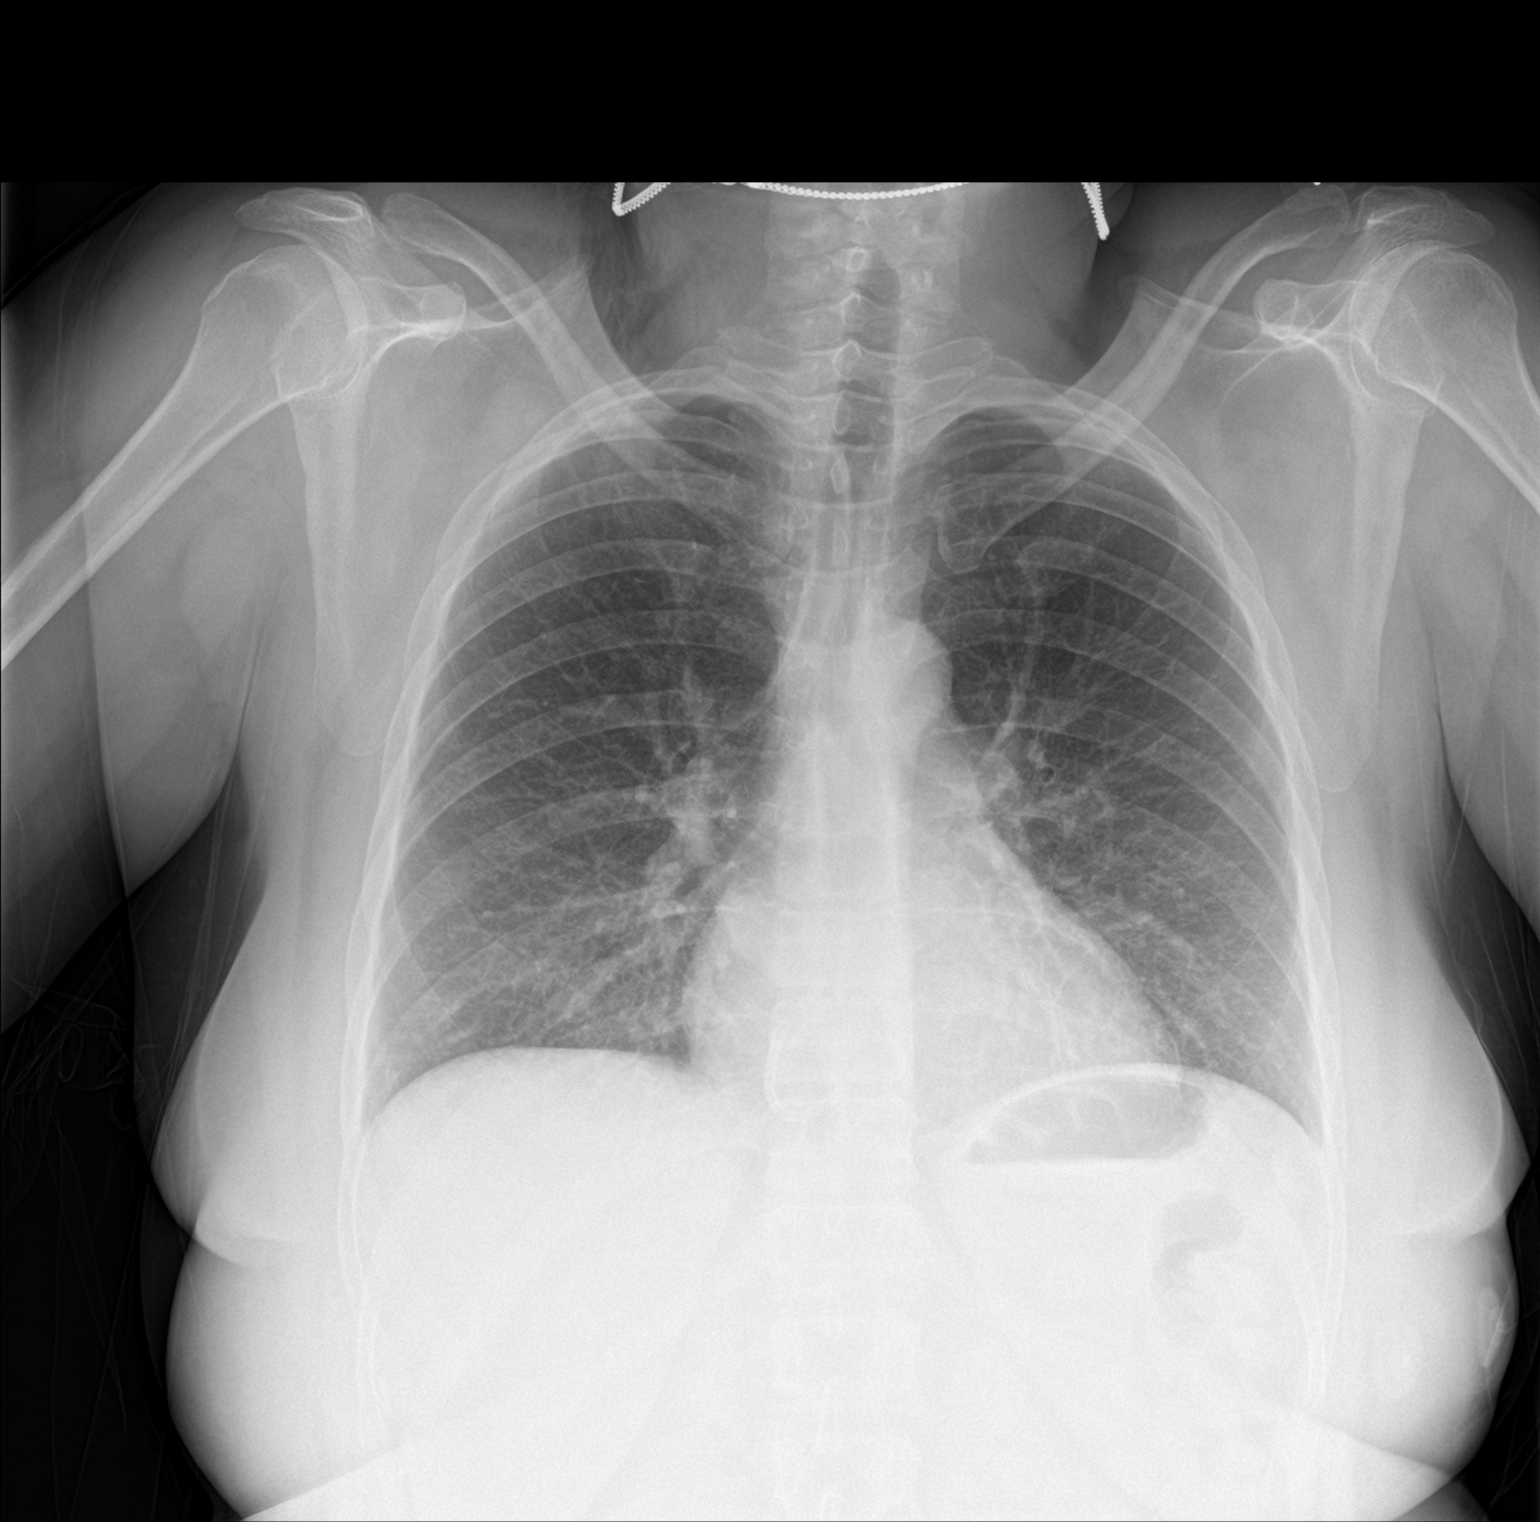

[chest lat]
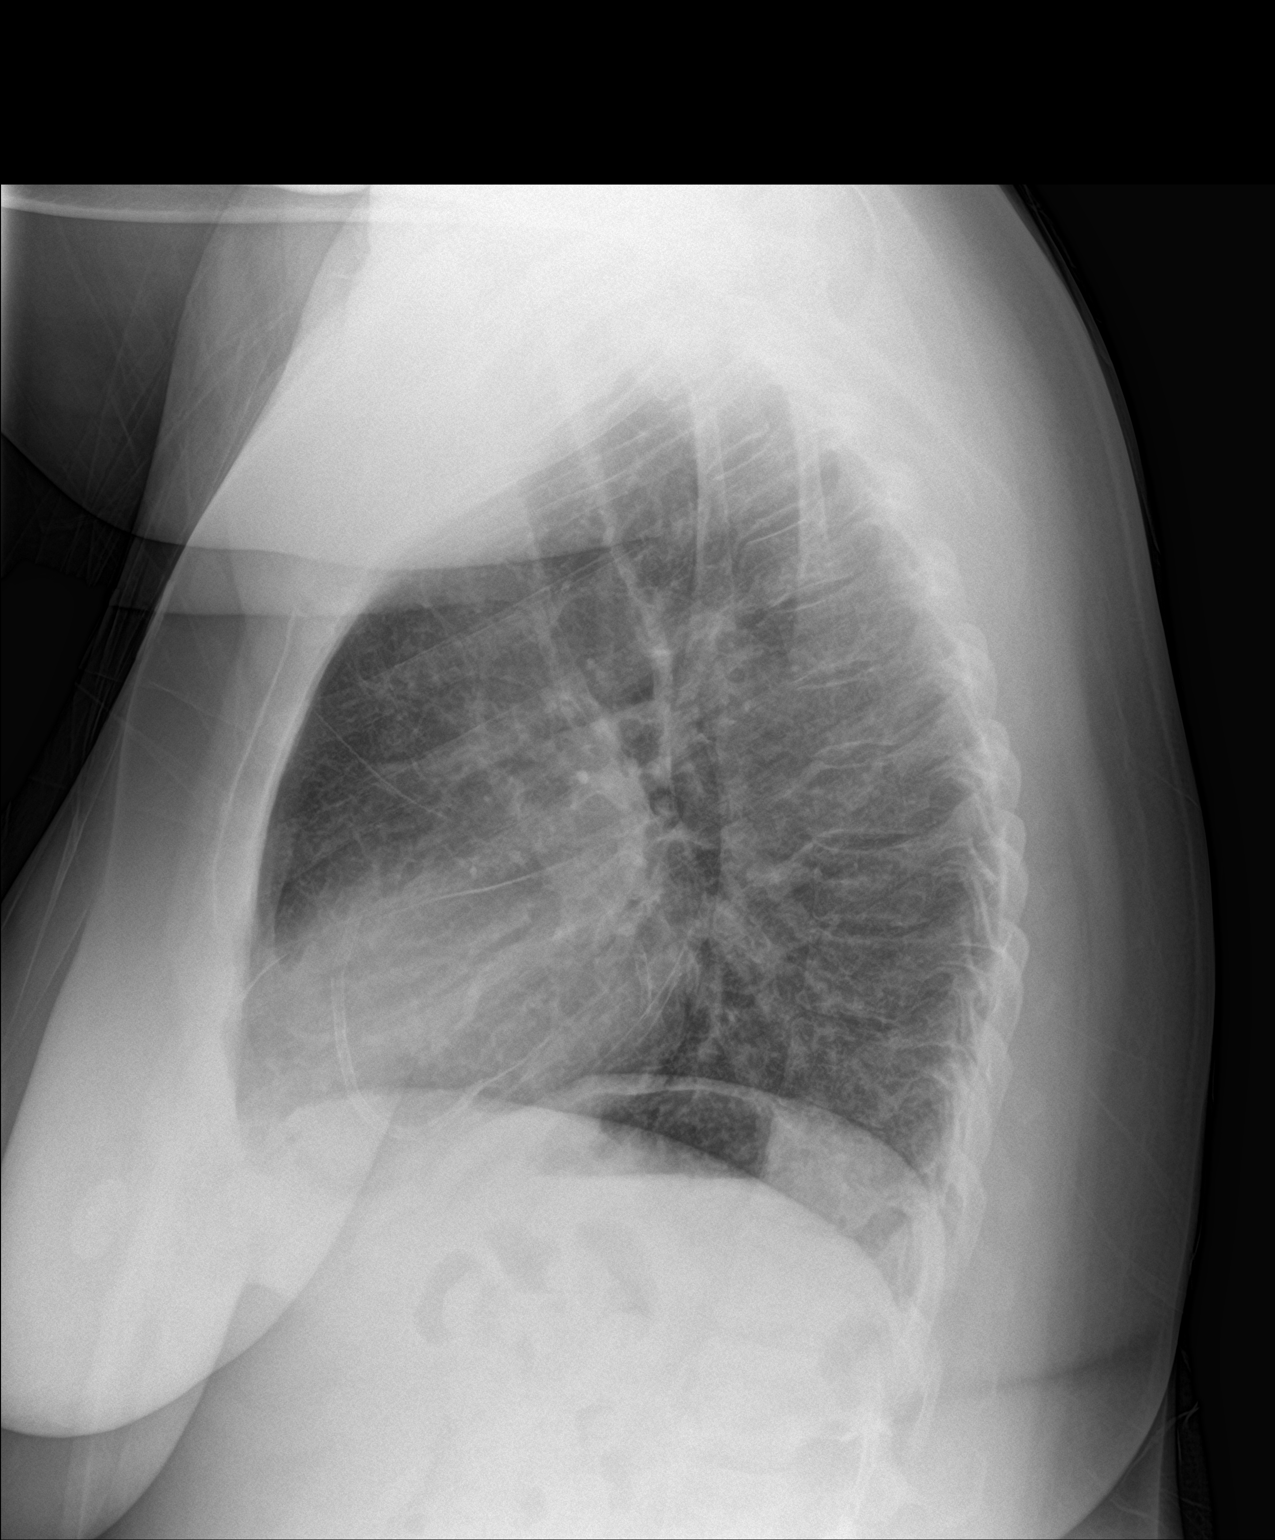

[2 of 2 positions shown; findings below may reference images not displayed]

FINDINGS: The heart is upper normal in size. Normal vascularity. Lungs are
under aerated and clear. Right coronary artery stents are in place.
IMPRESSION: No active cardiopulmonary disease.

## 2018-11-13 IMAGING — CT CT RENAL STONE PROTOCOL
2 of 4 series · 15 of 46 positions shown, 17 images · non-contrast
Comparison: CT 02/21/2015

CLINICAL DATA: Right flank pain.

EXAM:
CT ABDOMEN AND PELVIS WITHOUT CONTRAST
TECHNIQUE: Multidetector CT imaging of the abdomen and pelvis was performed
following the standard protocol without IV contrast.

[Series 2: axial st · axial · 0.73mm/px · z∈[+787,+1177]mm · 12 of 88 slices shown, 14 images]
[im 5/88  soft-tissue]
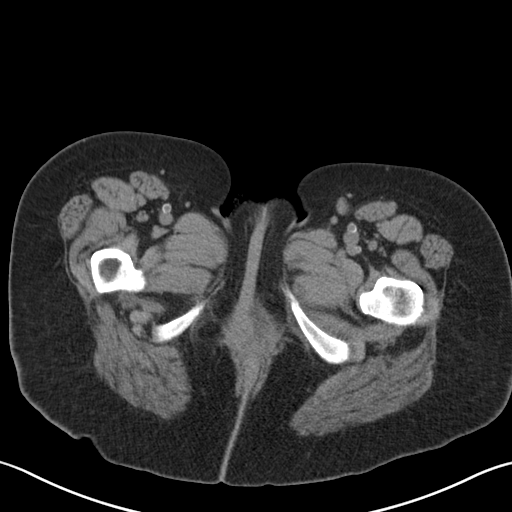
[im 5/88  bone]
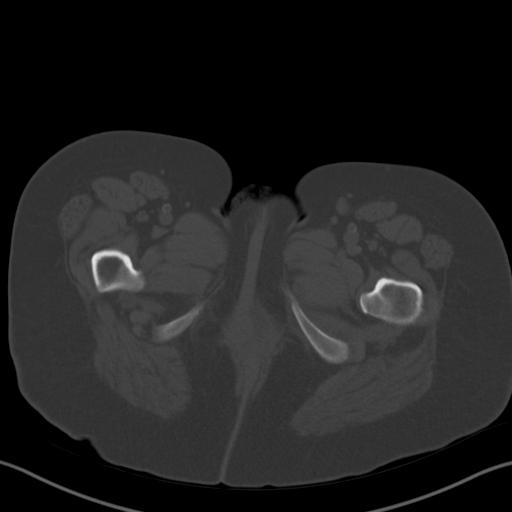
[im 14/88  soft-tissue]
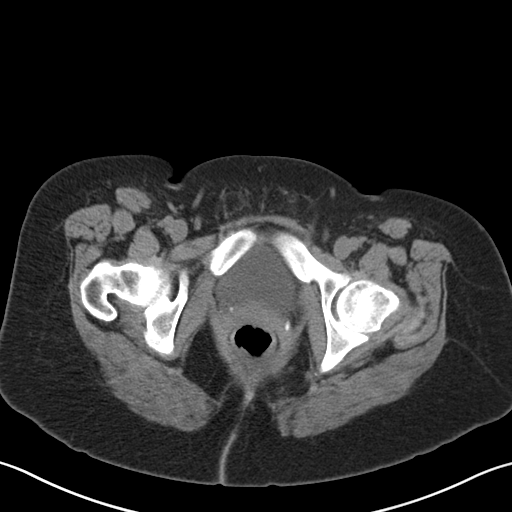
[im 19/88  soft-tissue]
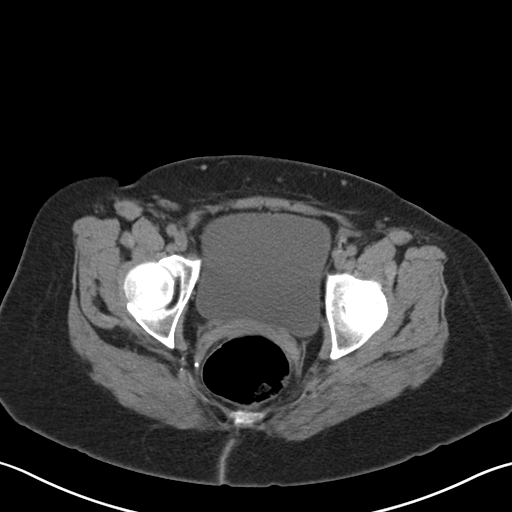
[im 28/88  soft-tissue]
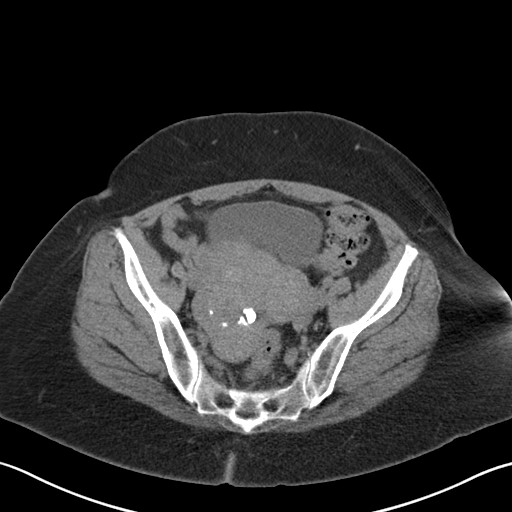
[im 33/88  soft-tissue]
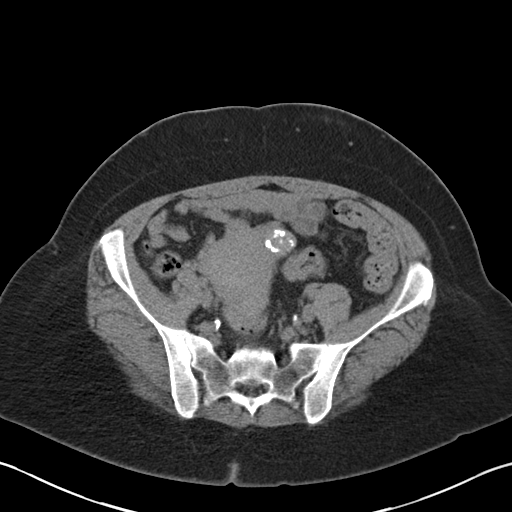
[im 42/88  soft-tissue]
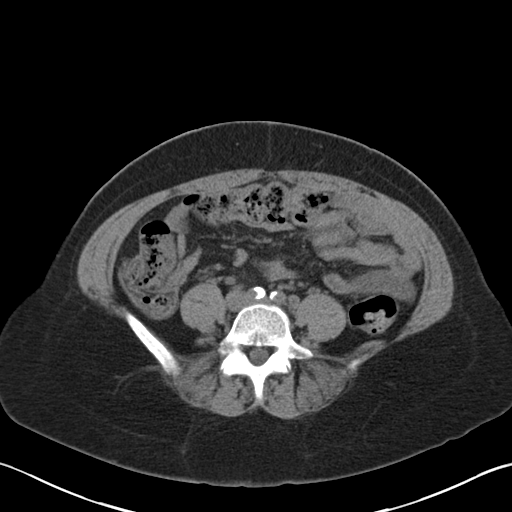
[im 46/88  soft-tissue]
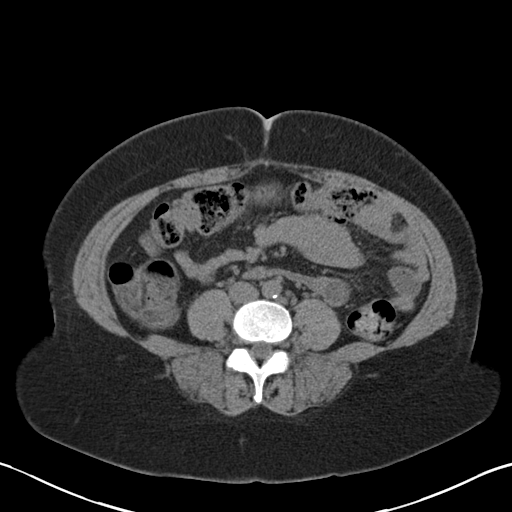
[im 55/88  soft-tissue]
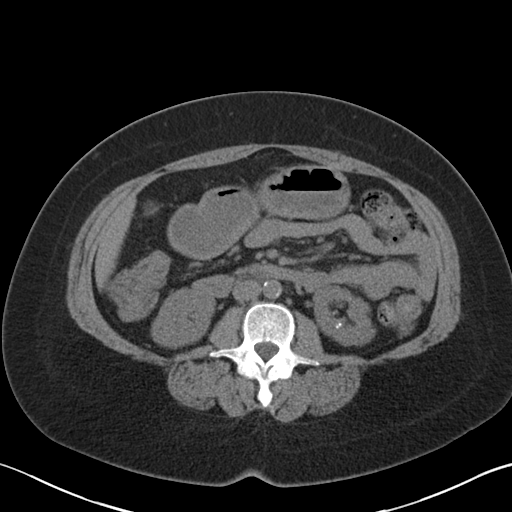
[im 60/88  soft-tissue]
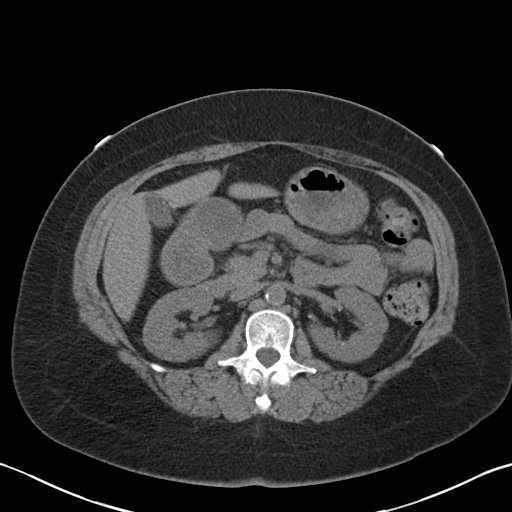
[im 60/88  bone]
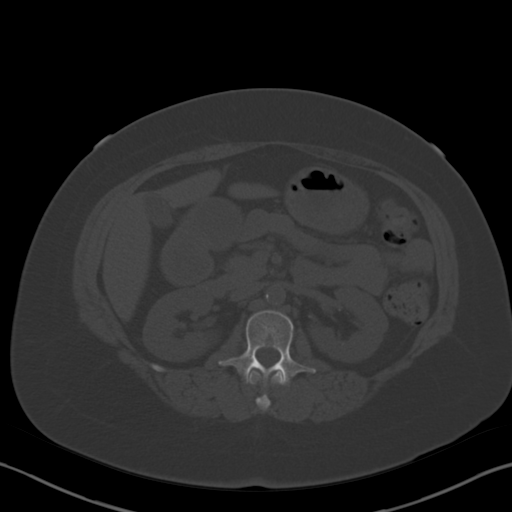
[im 69/88  soft-tissue]
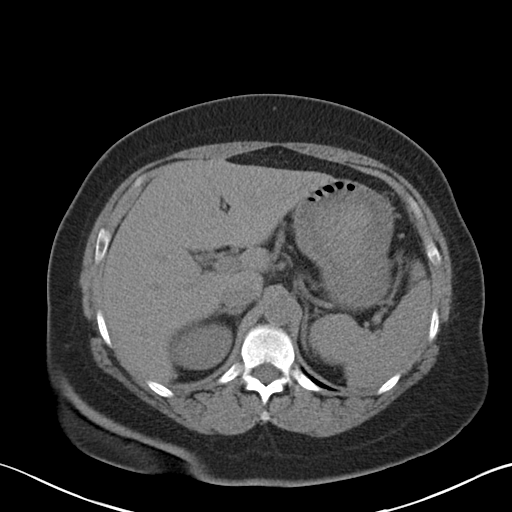
[im 74/88  soft-tissue]
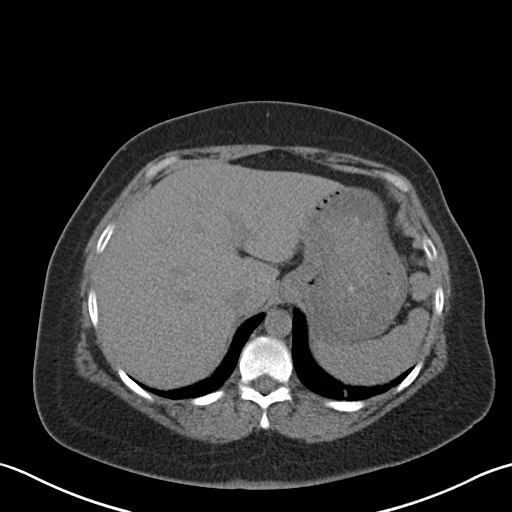
[im 83/88  soft-tissue]
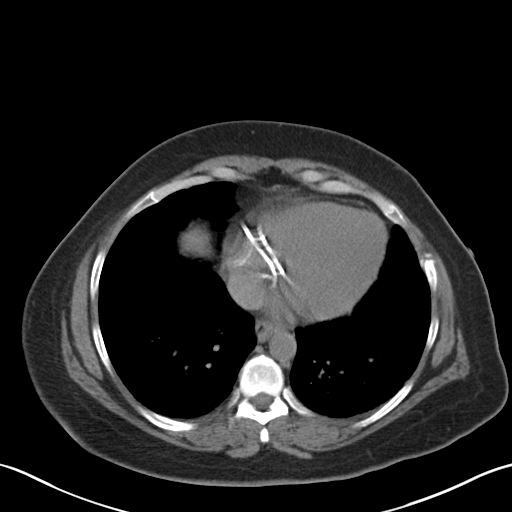

[Series 5: coronal · coronal · 0.67mm/px · 3 of 148 slices shown]
[im 50/148  soft-tissue]
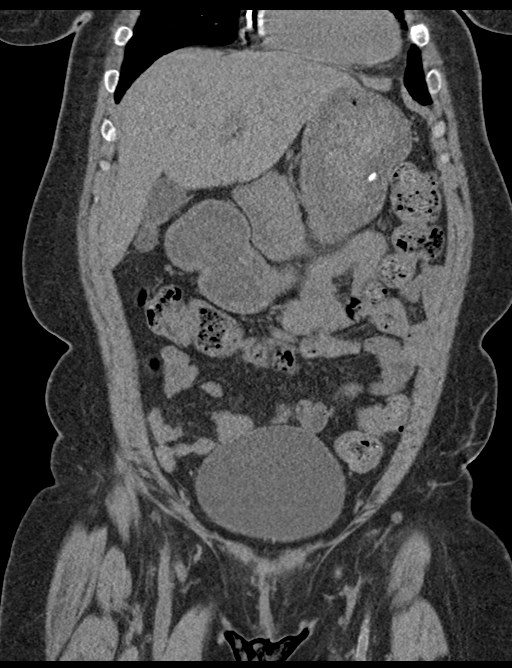
[im 66/148  soft-tissue]
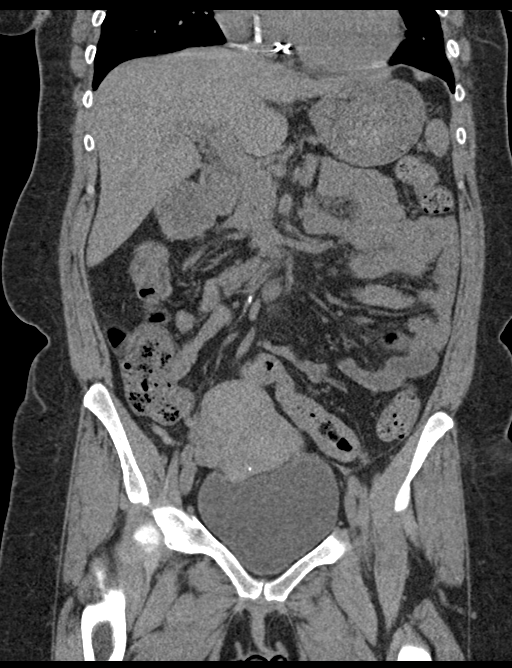
[im 82/148  soft-tissue]
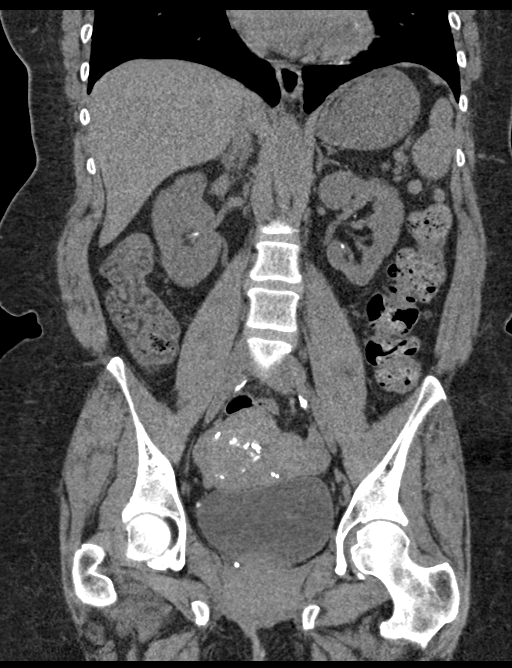

[15 of 46 positions shown; findings below may reference images not displayed]

FINDINGS: Lower chest: Subpleural opacities in both lower lobes, left greater
than right, may be hypoventilatory change. There are coronary artery
calcifications. Suspected mild bronchiolectasis in the left lower
lobe.

Hepatobiliary: No focal liver abnormality is seen. No gallstones,
gallbladder wall thickening, or biliary dilatation.

Pancreas: Parenchymal atrophy. Pancreas not well delineated on this
noncontrast exam. No peripancreatic inflammation.

Spleen: Normal in size without focal abnormality.

Adrenals/Urinary Tract: Normal adrenal glands. Calcifications at
both renal hila may be combination of nonobstructing stones and
vascular calcifications. Lobular left renal contours. No
hydronephrosis or perinephric edema. Ureters are decompressed
without stones along the course. Urinary bladder is physiologically
distended. No bladder wall thickening.

Stomach/Bowel: Stomach distended with ingested contents. No evidence
of gastric wall thickening. No small bowel inflammation, wall
thickening or inflammatory change. Normal appendix. Mild submucosal
fatty infiltration of the ascending colon, unchanged from prior
exam. Moderate stool in the transverse and descending colon.

Vascular/Lymphatic: Age advanced atherosclerosis. Multiple small
central mesenteric nodes, no bulky adenopathy.

Reproductive: Enlarged uterus with multiple calcified fibroids.
Ovaries not well visualized.

Other: No free air, free fluid, or intra-abdominal fluid collection.
Small fat containing umbilical hernia.

Musculoskeletal: There are no acute or suspicious osseous
abnormalities.
IMPRESSION: 1. No hydronephrosis or obstructive uropathy. Calcifications at both
renal hila may be combination of nonobstructing stones and vascular.
2. Stomach distended with fluid and ingested contents, likely
secondary to known gastroparesis.
3. Moderate colonic stool burden suggesting constipation.
4. Incidental findings of calcified uterine fibroids and Aortic
Atherosclerosis ([id number]).

## 2018-11-19 ENCOUNTER — Telehealth (INDEPENDENT_AMBULATORY_CARE_PROVIDER_SITE_OTHER): Payer: Medicaid Other | Admitting: Internal Medicine

## 2018-11-19 ENCOUNTER — Other Ambulatory Visit: Payer: Self-pay

## 2018-11-19 DIAGNOSIS — B081 Molluscum contagiosum: Secondary | ICD-10-CM

## 2018-11-19 DIAGNOSIS — E118 Type 2 diabetes mellitus with unspecified complications: Secondary | ICD-10-CM | POA: Diagnosis not present

## 2018-11-19 DIAGNOSIS — E1143 Type 2 diabetes mellitus with diabetic autonomic (poly)neuropathy: Secondary | ICD-10-CM

## 2018-11-19 DIAGNOSIS — Z716 Tobacco abuse counseling: Secondary | ICD-10-CM

## 2018-11-19 DIAGNOSIS — R079 Chest pain, unspecified: Secondary | ICD-10-CM

## 2018-11-19 DIAGNOSIS — K222 Esophageal obstruction: Secondary | ICD-10-CM

## 2018-11-19 DIAGNOSIS — M059 Rheumatoid arthritis with rheumatoid factor, unspecified: Secondary | ICD-10-CM

## 2018-11-19 DIAGNOSIS — E11621 Type 2 diabetes mellitus with foot ulcer: Secondary | ICD-10-CM

## 2018-11-19 DIAGNOSIS — K3184 Gastroparesis: Secondary | ICD-10-CM

## 2018-11-19 DIAGNOSIS — E782 Mixed hyperlipidemia: Secondary | ICD-10-CM

## 2018-11-19 DIAGNOSIS — L97421 Non-pressure chronic ulcer of left heel and midfoot limited to breakdown of skin: Secondary | ICD-10-CM

## 2018-11-19 DIAGNOSIS — F439 Reaction to severe stress, unspecified: Secondary | ICD-10-CM

## 2018-11-19 DIAGNOSIS — I10 Essential (primary) hypertension: Secondary | ICD-10-CM

## 2018-11-19 MED ORDER — SERTRALINE HCL 100 MG PO TABS: ORAL_TABLET | ORAL | 11 refills | Status: DC

## 2018-11-19 NOTE — Patient Instructions (Signed)
Followup with fasting labs in 2 months and with me 2 days later.

## 2018-11-19 NOTE — Progress Notes (Signed)
Subjective:    Patient ID: [name] [name], female   DOB: 1965-11-12, 53 y.o.   MRN: [id number]   HPI   Virtual Video visit with [employer] 55 minutes  1.  Skin Bumps:  Has been using Zymaderm twice daily.  Has been using for 2 weeks.  The lesions on her scalp are now gone.  The ones on her arms, legs, trunk are flattening out and drying up.    2.  DM:  This morning her sugar was 147 fasting. [place]ine Last A1C was 6.7% in March 2020.   Eyes checked regularly.  She is waiting to hear about her 6 month check--has been on hold with COVID-19 pandemic. Checking feet nightly. Left heel ulcer is healed. Pneumococcal and influenza vaccines up to date.  3.  RA:  MTX on hold.  We discussed MTX as a DMARD, not just to control her pain as the Tramadol does.  Discussed not wanting her arthritis to progress. She states she now understands now the difference  4.  CV/Hypertension/CHF History:  Rare chest pain.  None in past month--no NTG required. BP running 107/68.  5.  Gastroparesis, Esophageal stenosis, GERD:  GI prefers she is not taking metoclopramide.   She is using Zofran maybe 3 times weekly due to nausea. Currently relatively asymptomatic.  6.  Smoking:  Has moved to edibles with MJ.  Cigarette use now at 6-7 cigarettes daily.  7.  Depression/anxiety:  Would like to to increase her Sertraline  Current Meds  Medication Sig  . acetaminophen (TYLENOL) 500 MG tablet Take 500-1,000 mg by mouth every 6 (six) hours as needed for moderate pain.   Marland Kitchen albuterol (PROVENTIL HFA;VENTOLIN HFA) 108 (90 Base) MCG/ACT inhaler Inhale 2 puffs into the lungs every 6 (six) hours as needed for wheezing or shortness of breath.  . ciclopirox (PENLAC) 8 % solution Apply topically at bedtime. Apply over nail and surrounding skin. Apply daily over previous coat. After seven (7) days, may remove with alcohol and continue cycle.  . clopidogrel (PLAVIX) 75 MG tablet TAKE 1 TABLET BY MOUTH EVERY DAY  . cyclobenzaprine  (FLEXERIL) 10 MG tablet Take 1-2 tablets by mouth at bedtime as needed. Muscle spasms  . folic acid (FOLVITE) 1 MG tablet Take 1 mg by mouth daily.  . furosemide (LASIX) 40 MG tablet Take 1 tablet (40 mg total) by mouth every morning.  . Homeopathic Products (ZYMADERM) SOLN Apply 1 application topically 2 (two) times daily. Twice daily  . Incontinence Supply Disposable (POISE ULTRA THINS) PADS Use 5 pads daily as needed for urinary stress incontinence  . insulin aspart (NOVOLOG) 100 UNIT/ML injection Inject 4-16 Units into the skin 3 (three) times daily with meals. Sliding scale CBG 100-150; 4 units, 151-200; 6 units, 201-250; 8 units, 251-300; 10 units, 301-350; 12 units, 351-400; 14 units, > 401; call doctor  . LANTUS SOLOSTAR 100 UNIT/ML Solostar Pen Inject 10 Units into the skin 2 (two) times daily.   . metoprolol tartrate (LOPRESSOR) 25 MG tablet Take 1 tablet (25 mg total) by mouth 2 (two) times daily.  . ondansetron (ZOFRAN) 4 MG tablet TAKE 1 TABLET (4 MG TOTAL) BY MOUTH EVERY 6 (SIX) HOURS.  Marland Kitchen pantoprazole (PROTONIX) 40 MG tablet Take 1 tablet (40 mg total) by mouth daily.  . prednisoLONE acetate (PRED FORTE) 1 % ophthalmic suspension Place 4-5 drops into the left eye daily.  . rosuvastatin (CRESTOR) 40 MG tablet Take 1 tablet (40 mg total) by mouth daily.  Marland Kitchen  sertraline (ZOLOFT) 100 MG tablet 1 tab by mouth daily (Patient taking differently: Take 100 mg by mouth daily. )  . traMADol (ULTRAM) 50 MG tablet TAKE 1 TABLET EVERY 6 HOURS AS NEEDED FOR MODERATE PAIN (Patient taking differently: Take 50 mg by mouth every 6 (six) hours as needed for moderate pain. )   Allergies  Allergen Reactions  . Aspirin Other (See Comments)    Stomach bleeds Stomach bleeds GI bleeding Other reaction(s): Other (See Comments) GI bleeding Stomach bleeds      Review of Systems    Objective:   LMP 01/12/2015   Physical Exam  NAD Looks well Skin lesions on abdomen:  Appear to be flat now and  just areas of hyperpigmentation. Lesions on legs drying, flattening  Left heel healed well, just hyperpigmentation where ulcer was.   Assessment & Plan  1.  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  To continue until gone.  2.  DM:  Good control.  As per Dr. Sharl Ma  3.  RA:  Encouraged her to rethink the MTX.  Discussed this is to prevent progression of the injury to joints and not just pain control.  Also, takes a while to work, so if she waits until in a lot of pain, may need prednisone short term, which will worsen her diabetic control  4.  Depression/anxiety/stress:  Worse with COVID-19 and restrictions.  Increase Sertraline possibly short term to 150 mg  Or 1 1/2 tab per day.  5. CV/hypertension status:  Stable.  6.  Tobacco abuse:  Encouraged her to get moving on this.  She has not rid herself of cigarettes or smoking paraphernalia in past with smoking cessation (patches)  Discussed she needs to do this if serious.  Does have the patches--just has not started them. Also discussed the possibility of gum or lozenges when she has the urge to smoke.  Discussed how to utilize. Not ready yet.  7.  Gastroparesis/GERD/Esophageal stenosis:  Essentially asymptomatic with PPI and intermittent Zofran.  GI has discontinued Metoclopramide.

## 2018-11-21 NOTE — Progress Notes (Signed)
Noted  

## 2018-11-22 ENCOUNTER — Telehealth: Payer: Self-pay | Admitting: Internal Medicine

## 2018-11-22 NOTE — Telephone Encounter (Signed)
The Social Work Intern (SWI) spoke with client about next steps since the SWI is finishing her internship with [employer]. Client said she's interested in continuing with a counselor, so the SWI texted referals to her. The SWI also let client know that [facility] is in the process of hiring a Child psychotherapist.

## 2018-12-01 ENCOUNTER — Other Ambulatory Visit: Payer: Self-pay | Admitting: Internal Medicine

## 2018-12-01 NOTE — Progress Notes (Signed)
Therapy Progress Note Session Time:  60 minutes   [name] [name] is a 53 year old female who presents with a euthymic mood and appropriate affect. [name] [name] expressed relief that her dog is doing well after being neutered. She disclosed her anxiety previous to the surgery, and her emotionality while her dog was undergoing the procedure. She expressed that she feels good about her decision to get him neutered, and she's pleased with the results. [name] [name] gave an update on the situation with her partner, and emphasized that her self-care is more important to her than a relationship at this time. She revealed that she doesn't mind that her boyfriend is in her home as long as he lives by her rules. [name] [name] went over some of the rules that must be followed in her home including not exposing her pets to second-hand smoke, no drinking during the week, and supporting her efforts to be healthy. [name] [name] expressed frustration with her boyfriend's alcohol use and his boundary-crossing.    Therapist's Response 10/10/18   The social work intern (SWI) congratulated [name] [name] for following through with her plan to have her dog neutered. She empathized with [name] [name]'s fear about the surgery, and celebrated with [name] [name] over the dog's quick recovery. The SWI validated [name] [name]'s need for boundaries with her boyfriend and pointed out the importance of boundary-setting within relationships. The SWI validated [name] [name]'s decisiveness and clarity regarding her boundaries.   Kandee Keen, Student-Social Work 10/10/2018  Cosigned by: Felizardo Hoffmann, M.D.

## 2018-12-02 ENCOUNTER — Ambulatory Visit: Payer: Medicaid Other | Admitting: Podiatry

## 2018-12-02 ENCOUNTER — Encounter: Payer: Self-pay | Admitting: Podiatry

## 2018-12-02 ENCOUNTER — Other Ambulatory Visit: Payer: Self-pay

## 2018-12-02 VITALS — Temp 97.3°F

## 2018-12-02 DIAGNOSIS — L97424 Non-pressure chronic ulcer of left heel and midfoot with necrosis of bone: Secondary | ICD-10-CM | POA: Diagnosis not present

## 2018-12-02 DIAGNOSIS — E611 Iron deficiency: Secondary | ICD-10-CM | POA: Insufficient documentation

## 2018-12-02 DIAGNOSIS — M79675 Pain in left toe(s): Secondary | ICD-10-CM

## 2018-12-02 DIAGNOSIS — B351 Tinea unguium: Secondary | ICD-10-CM

## 2018-12-02 DIAGNOSIS — H1820 Unspecified corneal edema: Secondary | ICD-10-CM | POA: Insufficient documentation

## 2018-12-02 DIAGNOSIS — L989 Disorder of the skin and subcutaneous tissue, unspecified: Secondary | ICD-10-CM

## 2018-12-02 DIAGNOSIS — E13621 Other specified diabetes mellitus with foot ulcer: Secondary | ICD-10-CM

## 2018-12-02 DIAGNOSIS — M79674 Pain in right toe(s): Secondary | ICD-10-CM

## 2018-12-02 DIAGNOSIS — E119 Type 2 diabetes mellitus without complications: Secondary | ICD-10-CM

## 2018-12-02 DIAGNOSIS — G629 Polyneuropathy, unspecified: Secondary | ICD-10-CM | POA: Insufficient documentation

## 2018-12-02 DIAGNOSIS — L97429 Non-pressure chronic ulcer of left heel and midfoot with unspecified severity: Secondary | ICD-10-CM

## 2018-12-04 NOTE — Progress Notes (Signed)
Subjective: 53 year old female presents the office today for wound check to the left heel.  She is the area has healed and is been doing very well.  She still putting any ointment on the area.  She denies any redness or drainage or any swelling.  Is also asking for her nails be trimmed today as they are elongated she cannot trim them herself. Denies any systemic complaints such as fevers, chills, nausea, vomiting. No acute changes since last appointment, and no other complaints at this time.   Objective: AAO x3, NAD DP/PT pulses palpable bilaterally, CRT less than 3 seconds Area of the wound on the lateral aspect left heel appears to be healed.  No surrounding erythema, ascending cellulitis.  No fluctuation crepitation malodor.  No ascending cellulitis. Nails are hypertrophic, dystrophic, brittle, discolored, elongated 10. No surrounding redness or drainage. Tenderness nails 1-5 bilaterally. No open lesions or pre-ulcerative lesions are identified today Hyperkeratotic lesion left foot submetatarsal 5.  No underlying ulceration drainage or signs of infection No pain with calf compression, swelling, warmth, erythema  Assessment: Healed ulceration left side, symptomatic onychomycosis  Plan: -All treatment options discussed with the patient including all alternatives, risks, complications.  -Debrided nails x10 without any complications or bleeding. -As a courtesy I debrided the hyperkeratotic lesion left foot back and complications or bleeding.  Recommend moisturizer to this area as well as to the area of the wound on the left foot.  Monitor for any skin breakdown or reoccurrence. -Patient encouraged to call the office with any questions, concerns, change in symptoms.   Vivi Barrack DPM

## 2018-12-06 ENCOUNTER — Telehealth (INDEPENDENT_AMBULATORY_CARE_PROVIDER_SITE_OTHER): Payer: Medicaid Other | Admitting: Internal Medicine

## 2018-12-06 ENCOUNTER — Other Ambulatory Visit: Payer: Self-pay

## 2018-12-06 DIAGNOSIS — R21 Rash and other nonspecific skin eruption: Secondary | ICD-10-CM

## 2018-12-06 NOTE — Progress Notes (Signed)
Subjective:    Patient ID: [name] [name], female   DOB: 1966/04/25, 53 y.o.   MRN: [id number]   HPI   Virtual Video visit via [facility]  Pruritic skin lesions:  Was seen via Virtual video visit on 11/19/2018 after having used Zymaderm for possible Molluscum Contagiosum.   At them time, she felt the lesions were drying up and almost gone.   She went to get a refill of Zymaderm and continued, but in recent days, the lesions on her legs became larger and itchy again and she broke out on her buttocks with new lesions. Patient previously also treated for possible scabies for same skin lesions without success with 2 treatments one week apart.    Current Meds  Medication Sig   acetaminophen (TYLENOL) 500 MG tablet Take 500-1,000 mg by mouth every 6 (six) hours as needed for moderate pain.    albuterol (PROVENTIL HFA;VENTOLIN HFA) 108 (90 Base) MCG/ACT inhaler Inhale 2 puffs into the lungs every 6 (six) hours as needed for wheezing or shortness of breath.   ciclopirox (PENLAC) 8 % solution Apply topically at bedtime. Apply over nail and surrounding skin. Apply daily over previous coat. After seven (7) days, may remove with alcohol and continue cycle.   clopidogrel (PLAVIX) 75 MG tablet TAKE 1 TABLET BY MOUTH EVERY DAY   cyclobenzaprine (FLEXERIL) 10 MG tablet Take 1-2 tablets by mouth at bedtime as needed. Muscle spasms   folic acid (FOLVITE) 1 MG tablet Take 1 mg by mouth daily.   furosemide (LASIX) 40 MG tablet Take 1 tablet (40 mg total) by mouth every morning.   Homeopathic Products (ZYMADERM) SOLN Apply 1 application topically 2 (two) times daily. Twice daily   Incontinence Supply Disposable (POISE ULTRA THINS) PADS Use 5 pads daily as needed for urinary stress incontinence   insulin aspart (NOVOLOG) 100 UNIT/ML injection Inject 4-16 Units into the skin 3 (three) times daily with meals. Sliding scale CBG 100-150; 4 units, 151-200; 6 units, 201-250; 8 units, 251-300; 10 units, 301-350; 12  units, 351-400; 14 units, > 401; call doctor   LANTUS SOLOSTAR 100 UNIT/ML Solostar Pen Inject 10 Units into the skin 2 (two) times daily.    metoprolol tartrate (LOPRESSOR) 25 MG tablet Take 1 tablet (25 mg total) by mouth 2 (two) times daily.   nitroGLYCERIN (NITROSTAT) 0.4 MG SL tablet PLACE 1 TABLET (0.4 MG TOTAL) UNDER THE TONGUE EVERY 5 (FIVE) MINUTES AS NEEDED FOR CHEST PAIN.   ondansetron (ZOFRAN) 4 MG tablet TAKE 1 TABLET (4 MG TOTAL) BY MOUTH EVERY 6 (SIX) HOURS.   pantoprazole (PROTONIX) 40 MG tablet TAKE 1 TABLET (40 MG TOTAL) BY MOUTH 2 (TWO) TIMES DAILY BEFORE A MEAL FOR 30 DAYS, THEN 1 TABLET (40 MG TOTAL) DAILY BEFORE BREAKFAST.   prednisoLONE acetate (PRED FORTE) 1 % ophthalmic suspension Place 4-5 drops into the left eye daily.   rosuvastatin (CRESTOR) 40 MG tablet Take 1 tablet (40 mg total) by mouth daily.   sertraline (ZOLOFT) 100 MG tablet 1 1/2  tabs by mouth daily   traMADol (ULTRAM) 50 MG tablet TAKE 1 TABLET EVERY 6 HOURS AS NEEDED FOR MODERATE PAIN (Patient taking differently: Take 50 mg by mouth every 6 (six) hours as needed for moderate pain. )   Allergies  Allergen Reactions   Aspirin Other (See Comments)    Stomach bleeds Stomach bleeds GI bleeding Other reaction(s): Other (See Comments) GI bleeding Stomach bleeds      Review of Systems  Objective:   LMP 01/12/2015   Physical Exam   Papular lesions with increased pigmentation and some with erythema more pronounced on legs than last visit. Newer lesions on buttocks as well. No umbilication noted with these new lesions.   Assessment & Plan  Pruritic skin lesions:  Have not been able to rid [place] of these lesions.   Appeared to be umbilicated at last in house visit, but these appear more papular today. Dermatology referral.

## 2018-12-17 ENCOUNTER — Ambulatory Visit
Admission: RE | Admit: 2018-12-17 | Discharge: 2018-12-17 | Disposition: A | Discharge status: Home / Self Care | Payer: Medicaid Other | Source: Ambulatory Visit | Attending: Internal Medicine | Admitting: Internal Medicine

## 2018-12-17 ENCOUNTER — Other Ambulatory Visit: Payer: Self-pay

## 2018-12-17 DIAGNOSIS — Z1231 Encounter for screening mammogram for malignant neoplasm of breast: Secondary | ICD-10-CM

## 2018-12-29 ENCOUNTER — Other Ambulatory Visit: Payer: Self-pay | Admitting: Interventional Cardiology

## 2019-01-15 IMAGING — DX DG CHEST 1V PORT
1 series · 1 of 1 positions shown · non-contrast
Comparison: Chest radiograph April 12, 2018

CLINICAL DATA: Chest pain after eating crab C.

EXAM:
PORTABLE CHEST 1 VIEW

[chest]
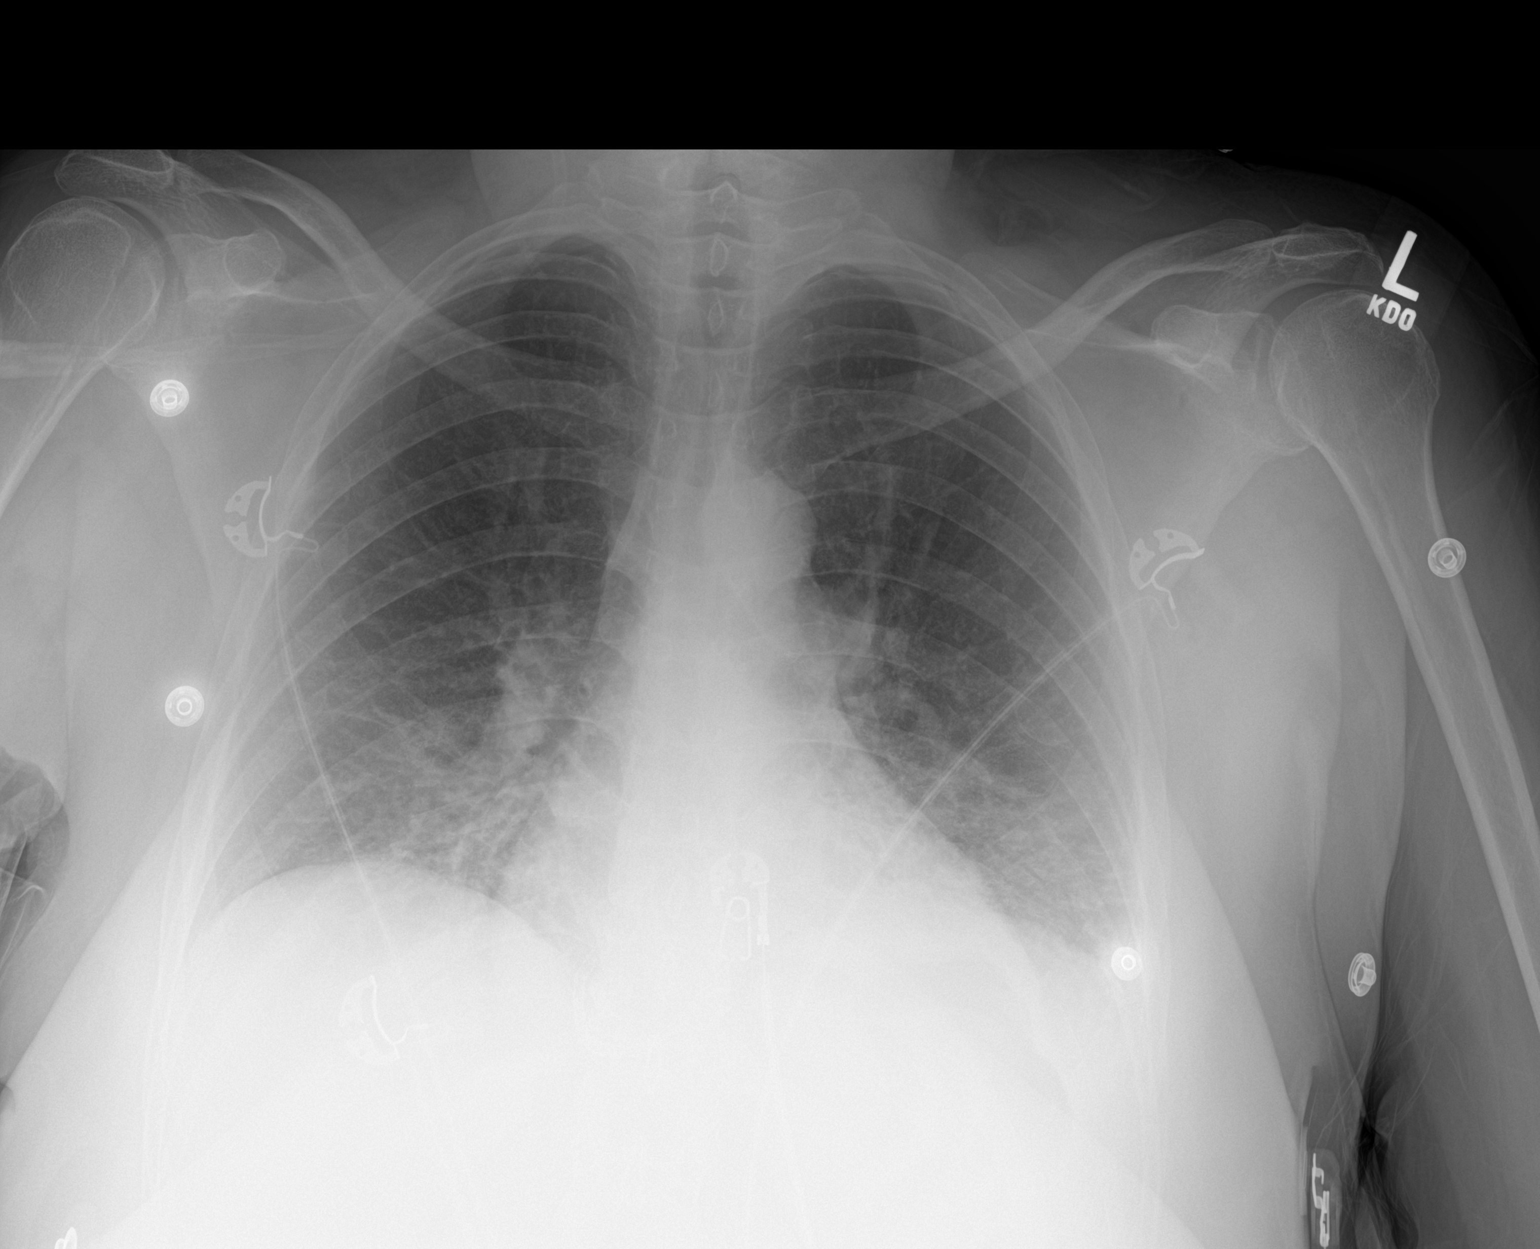

[1 of 1 positions shown; findings below may reference images not displayed]

FINDINGS: Cardiac silhouette is mildly enlarged and unchanged. Coronary artery
stent. Mediastinal silhouette is unremarkable. Pulmonary vascular
congestion interstitial prominence somewhat confluent in lung bases.
No pleural effusion or focal consolidation. No pneumothorax. Soft
tissue planes and included osseous structures are non suspicious.
IMPRESSION: Mild cardiomegaly. Interstitial prominence concerning for pulmonary
edema.

## 2019-01-24 ENCOUNTER — Other Ambulatory Visit (INDEPENDENT_AMBULATORY_CARE_PROVIDER_SITE_OTHER): Payer: Medicaid Other

## 2019-01-24 ENCOUNTER — Other Ambulatory Visit: Payer: Self-pay

## 2019-01-24 DIAGNOSIS — E118 Type 2 diabetes mellitus with unspecified complications: Secondary | ICD-10-CM

## 2019-01-24 DIAGNOSIS — E782 Mixed hyperlipidemia: Secondary | ICD-10-CM

## 2019-01-24 DIAGNOSIS — Z79899 Other long term (current) drug therapy: Secondary | ICD-10-CM

## 2019-01-25 ENCOUNTER — Other Ambulatory Visit: Payer: Self-pay | Admitting: Internal Medicine

## 2019-01-25 LAB — COMPREHENSIVE METABOLIC PANEL
ALT: 8 IU/L (ref 0–32)
AST: 24 IU/L (ref 0–40)
Albumin/Globulin Ratio: 1.2 (ref 1.2–2.2)
Albumin: 4.3 g/dL (ref 3.8–4.9)
Alkaline Phosphatase: 304 IU/L — ABNORMAL HIGH (ref 39–117)
BUN/Creatinine Ratio: 16 (ref 9–23)
BUN: 23 mg/dL (ref 6–24)
Bilirubin Total: 0.4 mg/dL (ref 0.0–1.2)
CO2: 23 mmol/L (ref 20–29)
Calcium: 9.7 mg/dL (ref 8.7–10.2)
Chloride: 103 mmol/L (ref 96–106)
Creatinine, Ser: 1.44 mg/dL — ABNORMAL HIGH (ref 0.57–1.00)
GFR calc Af Amer: 48 mL/min/{1.73_m2} — ABNORMAL LOW (ref >59)
GFR calc non Af Amer: 42 mL/min/{1.73_m2} — ABNORMAL LOW (ref >59)
Globulin, Total: 3.7 g/dL (ref 1.5–4.5)
Glucose: 96 mg/dL (ref 65–99)
Potassium: 3.9 mmol/L (ref 3.5–5.2)
Sodium: 144 mmol/L (ref 134–144)
Total Protein: 8 g/dL (ref 6.0–8.5)

## 2019-01-25 LAB — LIPID PANEL W/O CHOL/HDL RATIO
Cholesterol, Total: 166 mg/dL (ref 100–199)
HDL: 82 mg/dL (ref >39)
LDL Calculated: 69 mg/dL (ref 0–99)
Triglycerides: 74 mg/dL (ref 0–149)
VLDL Cholesterol Cal: 15 mg/dL (ref 5–40)

## 2019-01-25 LAB — CBC WITH DIFFERENTIAL/PLATELET
Basophils Absolute: 0.1 10*3/uL (ref 0.0–0.2)
Basos: 1 %
EOS (ABSOLUTE): 0.1 10*3/uL (ref 0.0–0.4)
Eos: 1 %
Hematocrit: 47.2 % — ABNORMAL HIGH (ref 34.0–46.6)
Hemoglobin: 15.3 g/dL (ref 11.1–15.9)
Immature Grans (Abs): 0 10*3/uL (ref 0.0–0.1)
Immature Granulocytes: 0 %
Lymphocytes Absolute: 2.9 10*3/uL (ref 0.7–3.1)
Lymphs: 27 %
MCH: 28.1 pg (ref 26.6–33.0)
MCHC: 32.4 g/dL (ref 31.5–35.7)
MCV: 87 fL (ref 79–97)
Monocytes Absolute: 0.7 10*3/uL (ref 0.1–0.9)
Monocytes: 7 %
Neutrophils Absolute: 6.8 10*3/uL (ref 1.4–7.0)
Neutrophils: 64 %
Platelets: 263 10*3/uL (ref 150–450)
RBC: 5.45 x10E6/uL — ABNORMAL HIGH (ref 3.77–5.28)
RDW: 16 % — ABNORMAL HIGH (ref 11.7–15.4)
WBC: 10.6 10*3/uL (ref 3.4–10.8)

## 2019-01-28 ENCOUNTER — Other Ambulatory Visit: Payer: Self-pay

## 2019-01-28 ENCOUNTER — Telehealth: Payer: Self-pay

## 2019-01-28 ENCOUNTER — Ambulatory Visit (INDEPENDENT_AMBULATORY_CARE_PROVIDER_SITE_OTHER): Payer: Medicaid Other | Admitting: Internal Medicine

## 2019-01-28 ENCOUNTER — Encounter: Payer: Self-pay | Admitting: Internal Medicine

## 2019-01-28 VITALS — BP 102/70 | HR 78 | Resp 12 | Ht 61.0 in | Wt 153.0 lb

## 2019-01-28 DIAGNOSIS — Z716 Tobacco abuse counseling: Secondary | ICD-10-CM

## 2019-01-28 DIAGNOSIS — E782 Mixed hyperlipidemia: Secondary | ICD-10-CM | POA: Diagnosis not present

## 2019-01-28 DIAGNOSIS — E118 Type 2 diabetes mellitus with unspecified complications: Secondary | ICD-10-CM

## 2019-01-28 DIAGNOSIS — R748 Abnormal levels of other serum enzymes: Secondary | ICD-10-CM

## 2019-01-28 DIAGNOSIS — L739 Follicular disorder, unspecified: Secondary | ICD-10-CM | POA: Diagnosis not present

## 2019-01-28 DIAGNOSIS — R7989 Other specified abnormal findings of blood chemistry: Secondary | ICD-10-CM

## 2019-01-28 NOTE — Patient Instructions (Signed)
Start making goals regarding your diet--skip the starches and eat more veggies:  A cooked veggie and a tossed salad with oil and vinegar and no added sugar

## 2019-01-28 NOTE — Progress Notes (Signed)
Subjective:    Patient ID: [name] [name], female   DOB: 1966-07-27, 53 y.o.   MRN: [id number]   HPI   1.  Hyperlipidemia:  Discussed her cholesterol panel is great.  She continues to take Rosuvastatin 40 mg, but has also started walking daily and her HDL has jumped into the 80s  The rest of her panel also at goal. Lipid Panel     Component Value Date/Time   CHOL 166 01/24/2019 0908   TRIG 74 01/24/2019 0908   HDL 82 01/24/2019 0908   CHOLHDL 2.1 06/16/2018 0642   VLDL 14 06/16/2018 0642   LDLCALC 69 01/24/2019 0908     2.  Renal Insufficiency:  Appears with her recent chemistries that her renal function was a bit decreased.  She describes doing a 24 hour urine collection for creatinine clearance with Endocrinology (Dr. Buddy Duty) and was stable--1 month ago. She was likely a bit dehydrated based on labs 4 days ago.  Had not had anything to drink until right before her labs.  She has had significant water intake this morning.  3.  Elevated alk phos:  Higher than previously noted.  Not clear if lab error or other.  4.  Skin lesions:  Biopsied and found to be a folliculitis I have not heard of.  Was treated with Xyzal and topical corticosteroids, the latter twice daily and the rash is drying up.  5.  DM:  Has not been eating well--eating lot of TV dinners, Oodles of Noodles.  Bread and crackers in the morning. Has not switched entirely to edible MJ from the smoked,  Intermittently smoking.   Since the pandemic, she has not been cooking as fewer people in the house. Her sugars are up and down and she is only eating once daily. Uses MJ to increase her appetite.   Fell on Sunday.  States she accidentally doubled up on her Lantus and Novolog both--forgot she had already taken it before her meal and then was in 300s after her meal and gave herself another dose, thinking she had missed the dose. Her granddaughter found her after she passed out and fell.  She has bruising and abrasions to her left  arm and shoulder and back.   Treated with OJ and other food and resolved.  Patient was able to speak and refused a call to EMS.  When sugar rechecked after she started with juice, registered 35.   Current Meds  Medication Sig   acetaminophen (TYLENOL) 500 MG tablet Take 500-1,000 mg by mouth every 6 (six) hours as needed for moderate pain.    albuterol (PROVENTIL HFA;VENTOLIN HFA) 108 (90 Base) MCG/ACT inhaler Inhale 2 puffs into the lungs every 6 (six) hours as needed for wheezing or shortness of breath.   clopidogrel (PLAVIX) 75 MG tablet TAKE 1 TABLET BY MOUTH EVERY DAY   cyclobenzaprine (FLEXERIL) 10 MG tablet Take 1-2 tablets by mouth at bedtime as needed. Muscle spasms   folic acid (FOLVITE) 1 MG tablet Take 1 mg by mouth daily.   furosemide (LASIX) 40 MG tablet Take 1 tablet (40 mg total) by mouth every morning.   Incontinence Supply Disposable (POISE ULTRA THINS) PADS Use 5 pads daily as needed for urinary stress incontinence   insulin aspart (NOVOLOG) 100 UNIT/ML injection Inject 4-16 Units into the skin 3 (three) times daily with meals. Sliding scale CBG 100-150; 4 units, 151-200; 6 units, 201-250; 8 units, 251-300; 10 units, 301-350; 12 units, 351-400; 14 units, > 401;  call doctor   LANTUS SOLOSTAR 100 UNIT/ML Solostar Pen Inject 10 Units into the skin 2 (two) times daily.    metoprolol tartrate (LOPRESSOR) 25 MG tablet Take 1 tablet (25 mg total) by mouth 2 (two) times daily.   nitroGLYCERIN (NITROSTAT) 0.4 MG SL tablet PLACE 1 TABLET (0.4 MG TOTAL) UNDER THE TONGUE EVERY 5 (FIVE) MINUTES AS NEEDED FOR CHEST PAIN.   ondansetron (ZOFRAN) 4 MG tablet TAKE 1 TABLET (4 MG TOTAL) BY MOUTH EVERY 6 (SIX) HOURS.   pantoprazole (PROTONIX) 40 MG tablet TAKE 1 TABLET (40 MG TOTAL) BY MOUTH 2 (TWO) TIMES DAILY BEFORE A MEAL FOR 30 DAYS, THEN 1 TABLET (40 MG TOTAL) DAILY BEFORE BREAKFAST.   prednisoLONE acetate (PRED FORTE) 1 % ophthalmic suspension Place 4-5 drops into the left eye  daily.   rosuvastatin (CRESTOR) 40 MG tablet TAKE 1 TABLET BY MOUTH EVERY DAY   sertraline (ZOLOFT) 100 MG tablet 1 1/2  tabs by mouth daily   traMADol (ULTRAM) 50 MG tablet TAKE 1 TABLET EVERY 6 HOURS AS NEEDED FOR MODERATE PAIN (Patient taking differently: Take 50 mg by mouth every 6 (six) hours as needed for moderate pain. )   triamcinolone cream (KENALOG) 0.1 % APPLY ON THE SKIN TWICE A DAY X2 WEEKS, TAKE 2 WEEKS OFF THEN REPEAT   Allergies  Allergen Reactions   Aspirin Other (See Comments)    Stomach bleeds Stomach bleeds GI bleeding Other reaction(s): Other (See Comments) GI bleeding Stomach bleeds      Review of Systems    Objective:   BP 102/70 (BP Location: Left Arm, Patient Position: Sitting, Cuff Size: Normal)    Pulse 78    Resp 12    Ht _0  (1.549 m)    Wt 153 lb (69.4 kg)    LMP 01/12/2015    BMI 28.91 kg/m   Physical Exam   NAD HEENT:  Right eye with pupil reactive to light.   Chest:  CTA CV:  RRR without murmur or rub.  Radial and DP pulses normal and equal Abd:  S, NT, No HSM or mass, + BS LE:  No edema.  Scattered scabbed over papules over legs, front and back.   Skin:  No obvious bruising of abrasions today.   Assessment & Plan  1.  Hyperlipidemia:  Excellent on statin.  2.  DM:  Concerned with her [place] carb diet.   Discussed making small goals to move from starches to veggies with more fiber.  Cook and freeze healthier foods in portions for later use.  3.  Decrease renal function:  CMP today.  May have been additional dehydration with last check.  4.  Smoking:  Encouraged edibles and again nicotine in patch or other form to completely quit.  Not clear she is motivated.  5.  Elevated alk phos:  Repeat with CMP today.  6.  Skin lesions:  As per dermatology

## 2019-01-28 NOTE — Telephone Encounter (Signed)
Patient will be in office for appt for tomorrow. Patient answers no to all screening questions below. Patient understands that there are no visitors allowed. Patient understands not to arrive more than 15 minutes prior to the scheduled appointment time and that a mask will be required for the visit.     COVID-19 Pre-Screening Questions:  . In the past 7 to 10 days have you had a cough,  shortness of breath, headache, congestion, fever (100 or greater) body aches, chills, sore throat, or sudden loss of taste or sense of smell? NO . Have you been around anyone with known Covid 19? NO . Have you been around anyone who is awaiting Covid 19 test results in the past 7 to 10 days? NO . Have you been around anyone who has been exposed to Covid 19, or has mentioned symptoms of Covid 19 within the past 7 to 10 days? NO

## 2019-01-29 ENCOUNTER — Telehealth: Payer: Self-pay | Admitting: Physician Assistant

## 2019-01-29 ENCOUNTER — Other Ambulatory Visit: Payer: Medicaid Other

## 2019-01-29 ENCOUNTER — Ambulatory Visit (INDEPENDENT_AMBULATORY_CARE_PROVIDER_SITE_OTHER): Payer: Medicaid Other | Admitting: Physician Assistant

## 2019-01-29 ENCOUNTER — Encounter: Payer: Self-pay | Admitting: Physician Assistant

## 2019-01-29 VITALS — BP 108/64 | HR 75 | Ht 60.0 in | Wt 153.0 lb

## 2019-01-29 DIAGNOSIS — E785 Hyperlipidemia, unspecified: Secondary | ICD-10-CM | POA: Diagnosis not present

## 2019-01-29 DIAGNOSIS — R079 Chest pain, unspecified: Secondary | ICD-10-CM | POA: Diagnosis not present

## 2019-01-29 DIAGNOSIS — F411 Generalized anxiety disorder: Secondary | ICD-10-CM

## 2019-01-29 DIAGNOSIS — I252 Old myocardial infarction: Secondary | ICD-10-CM

## 2019-01-29 DIAGNOSIS — I130 Hypertensive heart and chronic kidney disease with heart failure and stage 1 through stage 4 chronic kidney disease, or unspecified chronic kidney disease: Secondary | ICD-10-CM

## 2019-01-29 DIAGNOSIS — I10 Essential (primary) hypertension: Secondary | ICD-10-CM

## 2019-01-29 DIAGNOSIS — I25119 Atherosclerotic heart disease of native coronary artery with unspecified angina pectoris: Secondary | ICD-10-CM

## 2019-01-29 NOTE — Patient Instructions (Addendum)
Medication Instructions:  Your physician recommends that you continue on your current medications as directed. Please refer to the Current Medication list given to you today.  If you need a refill on your cardiac medications before your next appointment, please call your pharmacy.   Lab work:  Lipid panel today  If you have labs (blood work) drawn today and your tests are completely normal, you will receive your results only by: Marland Kitchen MyChart Message (if you have MyChart) OR . A paper copy in the mail If you have any lab test that is abnormal or we need to change your treatment, we will call you to review the results.  Testing/Procedures: Your physician has requested that you have a lexiscan myoview. For further information please visit [email or web address]. Please follow instruction sheet, as given.  Follow-Up: At [facility], you and your health needs are our priority.  As part of our continuing mission to provide you with exceptional heart care, we have created designated Provider Care Teams.  These Care Teams include your primary Cardiologist (physician) and Advanced Practice Providers (APPs -  Physician Assistants and Nurse Practitioners) who all work together to provide you with the care you need, when you need it. You will need a follow up appointment in 12 months.  Please call our office 2 months in advance to schedule this appointment.  You may see Larae Grooms, MD or one of the following Advanced Practice Providers on your designated Care Team:   [place], PA-C Melina Copa, PA-C . Ermalinda Barrios, PA-C

## 2019-01-29 NOTE — Telephone Encounter (Signed)
New messae:    Patient would like to go to lab to get blood work. Patient left some paper work in lab and she needs it. Please call patient.

## 2019-01-29 NOTE — Telephone Encounter (Signed)
Spoke with the patient, she found her paperwork and wanted to be schedule her fasting lipids at [place]. She will come tomorrow morning. The patient had no other concerns.

## 2019-01-29 NOTE — Progress Notes (Signed)
Cardiology Office Note    Date:  01/29/2019   ID:  [name] [name], DOB March 31, 1966, MRN [id number]  PCP:  Mack Hook, MD  Cardiologist: No primary care provider on file. EPS: None  No chief complaint on file.   History of Present Illness:  [name] [name] is a 53 y.o. female   With history of CAD s/p inf MI 2010, NSTEMI 11/1012 DES Cfx. Bleeding issues on plavix stopped before 12 month course.Severe visual changes. 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   Last saw Dr. Irish Lack 06/2018 and doing well.  Comes in for a regular check up. While waiting she developed chest pain feels like someone hit her and fist in her chest/indigestion right side of chest.She took a NTG and it's easing. She says she's claustrophobic with the mask on. Has been using more NTG with the pandemic. Just came from [employer]. Very stressed. Nervous about getting blood drawn. Smokes 3 cigs/day.Thinks she just had a panic attack. Walks 1/2 mile daily. No chest pain, or shortness of breath, dizziness or presyncope. Testing marijuana out and helping with pain and nausea. Likes street marijuana better than CBD oil. Smoking and eating it.Had a fall last week when sugar dropped too low.  Past Medical History:  Diagnosis Date  . Acute myocardial infarction of other lateral wall, initial episode of care   . Acute myocardial infarction, unspecified site, initial episode of care   . Acute osteomyelitis   . Allergic rhinitis   . Anemia   . Anginal pain ([place])    07/15/13- no chest pain in months"  . Anxiety   . Bipolar affective ([place])   . CAD (coronary artery disease) 07/2011   s/p DES mid and distal RCA with 50% LAD  . Daily headache    not daily  . Depression    Bipolar disorder  . Diabetic foot ulcer associated with diabetes mellitus due to underlying condition ([place]) 09/26/2018  . Diabetic gastroparesis associated with type 2 diabetes mellitus ([place])   . Diabetic peripheral neuropathy associated with  type 2 diabetes mellitus ([place])   . Diabetic retinopathy   . Esophageal stenosis   . Esophageal ulcer   . Esophagitis   . Gastroparesis   . Genital herpes    Reportedly tested and documented by [facility]--rare occurrences  . GERD (gastroesophageal reflux disease)   . Heart murmur   . History of stomach ulcers   . Hyperlipidemia   . Hypertension   . Inferior MI ([place]) 01/20/2009   Archie Endo on 12/19/2012, "that's the only one I've had" (12/19/2012)  . Migraines   . Orthopnoea   . Pneumonia 2012  . Polysubstance abuse ([place])    Crack cocaine--none since 2008, MJ, ETOH:  clean of all since 2008  . Renal insufficiency   . Rheumatoid arthritis(714.0)   . Sebaceous cyst   . Stroke [facility]) 2011   denies residual on 12/19/2012.  "Years ago"  . Type II diabetes mellitus (HCC)    Previously uncontrolled for many years with multiple complications.  2017 controlled. 05/2016:  6.7%    Past Surgical History:  Procedure Laterality Date  . CORONARY ANGIOPLASTY WITH STENT PLACEMENT  01/20/2009   "2" (12/19/2012)  . CORONARY ANGIOPLASTY WITH STENT PLACEMENT  2012   "2" (12/19/2012)  . CORONARY ANGIOPLASTY WITH STENT PLACEMENT  12/19/2012   "2" (12/19/2012)  . ESOPHAGOGASTRODUODENOSCOPY N/A 02/26/2015   Procedure: ESOPHAGOGASTRODUODENOSCOPY (EGD);  Surgeon: Teena Irani, MD;  Location: [facility] ENDOSCOPY;  Service: Endoscopy;  Laterality: N/A;  . EYE SURGERY     Multiple surgeries of both eyes:  last laser was 09/08/2015 of right eye.  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 GAS INSERTION Left 07/16/2013   Procedure: INSERTION OF GAS;  Surgeon: Adonis Brook, MD;  Location: [place];  Service: Ophthalmology;  Laterality: Left;  SF6  . GAS/FLUID EXCHANGE Left 07/30/2013   Procedure: GAS/FLUID EXCHANGE;  Surgeon: Adonis Brook, MD;  Location: [place];  Service: Ophthalmology;  Laterality: Left;  . IRRIGATION AND DEBRIDEMENT SEBACEOUS CYST Right 03/2011   "pointer" (12/19/2012)  . LEFT HEART  CATHETERIZATION WITH CORONARY ANGIOGRAM N/A 07/06/2011   Procedure: LEFT HEART CATHETERIZATION WITH CORONARY ANGIOGRAM;  Surgeon: Jettie Booze, MD;  [facility] C[facility] LAB;  Service: Cardiovascular;  Laterality: N/A;  possible PCI  . LEFT HEART CATHETERIZATION WITH CORONARY ANGIOGRAM N/A 12/19/2012   Procedure: LEFT HEART CATHETERIZATION WITH CORONARY ANGIOGRAM;  Surgeon: Jettie Booze, MD;  Location: [facility] C[facility] LAB;  Service: Cardiovascular;  Laterality: N/A;  . MEMBRANE PEEL Left 07/16/2013   Procedure: MEMBRANE PEEL;  Surgeon: Adonis Brook, MD;  Location: [place];  Service: Ophthalmology;  Laterality: Left;  . PARS PLANA VITRECTOMY Left 07/16/2013   Procedure: PARS PLANA VITRECTOMY WITH 23 GAUGE;  Surgeon: Adonis Brook, MD;  Location: [place];  Service: Ophthalmology;  Laterality: Left;  . PARS PLANA VITRECTOMY Left 07/30/2013   Procedure: PARS PLANA VITRECTOMY WITH 23 GAUGE WITH ENDOLASER;  Surgeon: Adonis Brook, MD;  Location: [place];  Service: Ophthalmology;  Laterality: Left;  with endolaser  . PERCUTANEOUS CORONARY STENT INTERVENTION (PCI-S) N/A 07/06/2011   Procedure: PERCUTANEOUS CORONARY STENT INTERVENTION (PCI-S);  Surgeon: Jettie Booze, MD;  [facility] LAB;  Service: Cardiovascular;  Laterality: N/A;  . PHOTOCOAGULATION WITH LASER Left 07/16/2013   Procedure: PHOTOCOAGULATION WITH LASER;  Surgeon: Adonis Brook, MD;  Location: [place];  Service: Ophthalmology;  Laterality: Left;  ENDOLASER    Current Medications: Current Meds  Medication Sig  . Accu-Chek FastClix Lancets MISC CHECK BLOOD SUGAR 5 TIMES DAILY  . ACCU-CHEK GUIDE test strip USE AS DIRECTED CHECK BLOOD SUGAR 5 TIMES A DAY  . acetaminophen (TYLENOL) 500 MG tablet Take 500-1,000 mg by mouth every 6 (six) hours as needed for moderate pain.   Marland Kitchen albuterol (PROVENTIL HFA;VENTOLIN HFA) 108 (90 Base) MCG/ACT inhaler Inhale 2 puffs into the lungs every 6 (six) hours as needed for wheezing or shortness of breath.  .  Blood Glucose Monitoring Suppl (ACCU-CHEK GUIDE ME) w/Device KIT USE TO MONITOR BLOOD SUGAR FIVE TIMES A DAY DX  E10.22 IN VITRO  . ciclopirox (PENLAC) 8 % solution Apply topically at bedtime. Apply over nail and surrounding skin. Apply daily over previous coat. After seven (7) days, may remove with alcohol and continue cycle.  . clopidogrel (PLAVIX) 75 MG tablet TAKE 1 TABLET BY MOUTH EVERY DAY  . cyclobenzaprine (FLEXERIL) 10 MG tablet Take 1-2 tablets by mouth at bedtime as needed. Muscle spasms  . folic acid (FOLVITE) 1 MG tablet Take 1 mg by mouth daily.  . furosemide (LASIX) 40 MG tablet Take 1 tablet (40 mg total) by mouth every morning.  . Incontinence Supply Disposable (POISE ULTRA THINS) PADS Use 5 pads daily as needed for urinary stress incontinence  . insulin aspart (NOVOLOG) 100 UNIT/ML injection Inject 4-16 Units into the skin 3 (three) times daily with meals. Sliding scale CBG 100-150; 4 units, 151-200; 6 units, 201-250; 8 units, 251-300; 10 units,  301-350; 12 units, 351-400; 14 units, > 401; call doctor  . LANTUS SOLOSTAR 100 UNIT/ML Solostar Pen Inject 10 Units into the skin 2 (two) times daily.   . metoprolol tartrate (LOPRESSOR) 25 MG tablet Take 1 tablet (25 mg total) by mouth 2 (two) times daily.  . nitroGLYCERIN (NITROSTAT) 0.4 MG SL tablet PLACE 1 TABLET (0.4 MG TOTAL) UNDER THE TONGUE EVERY 5 (FIVE) MINUTES AS NEEDED FOR CHEST PAIN.  Marland Kitchen ondansetron (ZOFRAN) 4 MG tablet TAKE 1 TABLET (4 MG TOTAL) BY MOUTH EVERY 6 (SIX) HOURS.  Marland Kitchen pantoprazole (PROTONIX) 40 MG tablet Take 40 mg by mouth daily.  . prednisoLONE acetate (PRED FORTE) 1 % ophthalmic suspension Place 4-5 drops into the left eye daily.  . rosuvastatin (CRESTOR) 40 MG tablet TAKE 1 TABLET BY MOUTH EVERY DAY  . sertraline (ZOLOFT) 100 MG tablet 1 1/2  tabs by mouth daily  . traMADol (ULTRAM) 50 MG tablet TAKE 1 TABLET EVERY 6 HOURS AS NEEDED FOR MODERATE PAIN (Patient taking differently: Take 50 mg by mouth every 6 (six)  hours as needed for moderate pain. )  . triamcinolone cream (KENALOG) 0.1 % APPLY ON THE SKIN TWICE A DAY X2 WEEKS, TAKE 2 WEEKS OFF THEN REPEAT     Allergies:   Aspirin   Social History   Socioeconomic History  . Marital status: Divorced    Spouse name: Christia Reading  . Number of children: 1  . Years of education: 52  . Highest education level: [employer] school graduate  Occupational History  . Occupation: Disabled   Social Needs  . Financial resource strain: Not on file  . Food insecurity    Worry: Not on file    Inability: Not on file  . Transportation needs    Medical: Not on file    Non-medical: Not on file  Tobacco Use  . Smoking status: Current Every Day Smoker    Packs/day: 0.33    Years: 22.00    Pack years: 7.26    Types: Cigarettes  . Smokeless tobacco: Never Used  . Tobacco comment: Has been to classes--not sure if she wants to quit.  Changing to non menthol..  Chantix, patches, gum never helped.  9/19:  lot going on.  Substance and Sexual Activity  . Alcohol use: Yes    Alcohol/week: 0.0 standard drinks    Comment: socially--holidays  . Drug use: No    Types: "Crack" cocaine, Marijuana    Comment: 12/31/2012 " clean from crack and marijuana since 2008"  . Sexual activity: Not Currently    Partners: Male    Birth control/protection: None  Lifestyle  . Physical activity    Days per week: Not on file    Minutes per session: Not on file  . Stress: Not on file  Relationships  . Social Herbalist on phone: Not on file    Gets together: Not on file    Attends religious service: Not on file    Active member of club or organization: Not on file    Attends meetings of clubs or organizations: Not on file    Relationship status: Not on file  Other Topics Concern  . Not on file  Social History Narrative   Has lived in [place] for most of life   Disabled   Previously went to [place] and worked as Scientist, water quality.   Lives by herself near [place].    Divorced in 2015.      Family History:  The patient's family history includes Breast cancer (age of onset: 37) in her mother; Cancer (age of onset: 14) in her sister; Hypertension in her father and sister; Lung cancer (age of onset: 3) in her sister; Stomach cancer (age of onset: 26) in her sister.   ROS:   Please see the history of present illness.    Review of Systems  Eyes: Positive for visual disturbance.  Cardiovascular: Positive for chest pain.  Psychiatric/Behavioral: The patient is nervous/anxious.    All other systems reviewed and are negative.   PHYSICAL EXAM:   VS:  BP 108/64   Pulse 75   Ht 5' (1.524 m)   Wt 153 lb (69.4 kg)   LMP 01/12/2015   SpO2 99%   BMI 29.88 kg/m   Physical Exam  GEN: Well nourished, well developed, in no acute distress  Neck: no JVD, carotid bruits, or masses Cardiac:RRR; 2/6 systolic murmur. Respiratory:  clear to auscultation bilaterally, normal work of breathing GI: soft, nontender, nondistended, + BS Ext: without cyanosis, clubbing, or edema, Good distal pulses bilaterally Skin-bruising right should and back from recent fall-seen by PCP Neuro:  Alert and Oriented x 3 Psych: euthymic mood, full affect  Wt Readings from Last 3 Encounters:  01/29/19 153 lb (69.4 kg)  01/28/19 153 lb (69.4 kg)  10/24/18 159 lb (72.1 kg)      Studies/Labs Reviewed:   EKG:  EKG is  ordered today.  The ekg ordered today demonstrates NSR with inf Q waves  Recent Labs: 02/16/2018: TSH 0.377 06/15/2018: B Natriuretic Peptide 77.4 06/16/2018: Magnesium 2.7 01/24/2019: ALT 8; BUN 23; Creatinine, Ser 1.44; Hemoglobin 15.3; Platelets 263; Potassium 3.9; Sodium 144   Lipid Panel    Component Value Date/Time   CHOL 166 01/24/2019 0908   TRIG 74 01/24/2019 0908   HDL 82 01/24/2019 0908   CHOLHDL 2.1 06/16/2018 0642   VLDL 14 06/16/2018 0642   LDLCALC 69 01/24/2019 0908    Additional studies/ records that were reviewed today include:  Echo 05/2018  Study Conclusions   - Left ventricle: Basal septla and inferior basal hypokinesis The   cavity size was mildly dilated. Wall thickness was increased in a   pattern of mild LVH. Systolic function was normal. The estimated   ejection fraction was in the range of 50% to 55%. Wall motion was   normal; there were no regional wall motion abnormalities. Left   ventricular diastolic function parameters were normal. - Right atrium: The atrium was mildly dilated. - Atrial septum: A patent foramen ovale cannot be excluded.       ASSESSMENT:    1. Chest pain, unspecified type   2. Left-sided chest pain   3. Coronary artery disease involving native coronary artery of native heart with angina pectoris ([place])   4. Hyperlipidemia, unspecified hyperlipidemia type   5. Old MI (myocardial infarction)   6. Anxiety state   7. Hypertensive heart and chronic kidney disease with heart failure and stage 1 through stage 4 chronic kidney disease, or chronic kidney disease ([name])   8. Essential hypertension      PLAN:  In order of problems listed above:  Chest pain while in the office-she took NTG on her own-she says she had a panic attack over having lab work and bills and [id number]. No EKG changes and eased quickly.   CAD-remote MI-with chest pain will order lexiscan  Anxiety state and using marijuana to help with this  HTN-BP low  HLD LDL 69 01/24/19  Medication Adjustments/Labs and Tests Ordered: Current medicines are reviewed at length with the patient today.  Concerns regarding medicines are outlined above.  Medication changes, Labs and Tests ordered today are listed in the Patient Instructions below. Patient Instructions  Medication Instructions:  Your physician recommends that you continue on your current medications as directed. Please refer to the Current Medication list given to you today.  If you need a refill on your cardiac medications before your next appointment, please call  your pharmacy.   Lab work:  Lipid panel today  If you have labs (blood work) drawn today and your tests are completely normal, you will receive your results only by: Marland Kitchen MyChart Message (if you have MyChart) OR . A paper copy in the mail If you have any lab test that is abnormal or we need to change your treatment, we will call you to review the results.  Testing/Procedures: Your physician has requested that you have a lexiscan myoview. For further information please visit [email or web address]. Please follow instruction sheet, as given.  Follow-Up: At [facility], you and your health needs are our priority.  As part of our continuing mission to provide you with exceptional heart care, we have created designated Provider Care Teams.  These Care Teams include your primary Cardiologist (physician) and Advanced Practice Providers (APPs -  Physician Assistants and Nurse Practitioners) who all work together to provide you with the care you need, when you need it. You will need a follow up appointment in 12 months.  Please call our office 2 months in advance to schedule this appointment.  You may see Larae Grooms, MD or one of the following Advanced Practice Providers on your designated Care Team:   [place], PA-C Melina Copa, PA-C . Ermalinda Barrios, PA-C       Signed, Ermalinda Barrios, PA-C  01/29/2019 3:20 PM    [facility] [address], [place], [place]  [address] Phone: [phone]; Fax: [phone]

## 2019-01-30 LAB — LIPID PANEL
Chol/HDL Ratio: 1.9 ratio (ref 0.0–4.4)
Cholesterol, Total: 172 mg/dL (ref 100–199)
HDL: 89 mg/dL (ref >39)
LDL Calculated: 66 mg/dL (ref 0–99)
Triglycerides: 87 mg/dL (ref 0–149)
VLDL Cholesterol Cal: 17 mg/dL (ref 5–40)

## 2019-01-31 LAB — COMPREHENSIVE METABOLIC PANEL WITH GFR
ALT: 9 [IU]/L (ref 0–32)
AST: 20 [IU]/L (ref 0–40)
Albumin/Globulin Ratio: 1.3 (ref 1.2–2.2)
Albumin: 4.8 g/dL (ref 3.8–4.9)
Alkaline Phosphatase: 330 [IU]/L — ABNORMAL HIGH (ref 39–117)
BUN/Creatinine Ratio: 18 (ref 9–23)
BUN: 21 mg/dL (ref 6–24)
Bilirubin Total: 0.7 mg/dL (ref 0.0–1.2)
CO2: 17 mmol/L — ABNORMAL LOW (ref 20–29)
Calcium: 10.6 mg/dL — ABNORMAL HIGH (ref 8.7–10.2)
Chloride: 107 mmol/L — ABNORMAL HIGH (ref 96–106)
Creatinine, Ser: 1.19 mg/dL — ABNORMAL HIGH (ref 0.57–1.00)
GFR calc Af Amer: 61 mL/min/{1.73_m2}
GFR calc non Af Amer: 53 mL/min/{1.73_m2} — ABNORMAL LOW
Globulin, Total: 3.7 g/dL (ref 1.5–4.5)
Glucose: 77 mg/dL (ref 65–99)
Potassium: 4.2 mmol/L (ref 3.5–5.2)
Sodium: 142 mmol/L (ref 134–144)
Total Protein: 8.5 g/dL (ref 6.0–8.5)

## 2019-02-06 ENCOUNTER — Other Ambulatory Visit: Payer: Medicaid Other

## 2019-02-13 ENCOUNTER — Other Ambulatory Visit (INDEPENDENT_AMBULATORY_CARE_PROVIDER_SITE_OTHER): Payer: Medicaid Other

## 2019-02-13 ENCOUNTER — Other Ambulatory Visit: Payer: Self-pay

## 2019-02-13 DIAGNOSIS — R748 Abnormal levels of other serum enzymes: Secondary | ICD-10-CM | POA: Diagnosis not present

## 2019-02-14 LAB — GAMMA GT: GGT: 23 IU/L (ref 0–60)

## 2019-02-21 ENCOUNTER — Telehealth: Payer: Self-pay | Admitting: Internal Medicine

## 2019-02-21 NOTE — Telephone Encounter (Signed)
Spoke with patient. Lab results had been discussed. Patient will pick up order for labs when she come in for appointment for rash in groin area.

## 2019-02-21 NOTE — Telephone Encounter (Signed)
Patient called requesting a call back from [facility] to be informed of last lab results.  Please advise.

## 2019-02-25 ENCOUNTER — Ambulatory Visit: Payer: Medicaid Other | Admitting: Internal Medicine

## 2019-02-25 ENCOUNTER — Encounter: Payer: Self-pay | Admitting: Internal Medicine

## 2019-02-25 ENCOUNTER — Other Ambulatory Visit: Payer: Self-pay

## 2019-02-25 VITALS — BP 122/70 | HR 72 | Resp 12 | Ht 61.0 in | Wt 149.0 lb

## 2019-02-25 DIAGNOSIS — R748 Abnormal levels of other serum enzymes: Secondary | ICD-10-CM | POA: Diagnosis not present

## 2019-02-25 DIAGNOSIS — L0292 Furuncle, unspecified: Secondary | ICD-10-CM | POA: Insufficient documentation

## 2019-02-25 MED ORDER — DOXYCYCLINE HYCLATE 100 MG PO TABS: ORAL_TABLET | ORAL | 0 refills | Status: DC

## 2019-02-25 NOTE — Patient Instructions (Signed)
No triamcinolone cream to groin.  Wash area twice daily with Dial soap and pat dry. Try to keep dry  Call if no improvement in 4 days.

## 2019-02-25 NOTE — Progress Notes (Signed)
Subjective:    Patient ID: [name] [name], female   DOB: 03-Apr-1966, 53 y.o.   MRN: [id number]   HPI  Started with pruritic bumps in bilateral groin areas about a week ago.  Thought it was related to the lesions on her extremities for which she has been seen by Derm and started on Triamcinolone cream, so applied that cream to the new lesions over past week.   The lesions have worsened to pustules.  Ran out of triamcinolone cream. No fever.  Current Meds  Medication Sig  . Accu-Chek FastClix Lancets MISC CHECK BLOOD SUGAR 5 TIMES DAILY  . ACCU-CHEK GUIDE test strip USE AS DIRECTED CHECK BLOOD SUGAR 5 TIMES A DAY  . acetaminophen (TYLENOL) 500 MG tablet Take 500-1,000 mg by mouth every 6 (six) hours as needed for moderate pain.   Marland Kitchen albuterol (PROVENTIL HFA;VENTOLIN HFA) 108 (90 Base) MCG/ACT inhaler Inhale 2 puffs into the lungs every 6 (six) hours as needed for wheezing or shortness of breath.  . Blood Glucose Monitoring Suppl (ACCU-CHEK GUIDE ME) w/Device KIT USE TO MONITOR BLOOD SUGAR FIVE TIMES A DAY DX  E10.22 IN VITRO  . clopidogrel (PLAVIX) 75 MG tablet TAKE 1 TABLET BY MOUTH EVERY DAY  . cyclobenzaprine (FLEXERIL) 10 MG tablet Take 1-2 tablets by mouth at bedtime as needed. Muscle spasms  . diphenhydrAMINE (BENADRYL) 25 MG tablet Take 25 mg by mouth every 6 (six) hours as needed.  . folic acid (FOLVITE) 1 MG tablet Take 1 mg by mouth daily.  . furosemide (LASIX) 40 MG tablet Take 1 tablet (40 mg total) by mouth every morning.  . Incontinence Supply Disposable (POISE ULTRA THINS) PADS Use 5 pads daily as needed for urinary stress incontinence  . insulin aspart (NOVOLOG) 100 UNIT/ML injection Inject 4-16 Units into the skin 3 (three) times daily with meals. Sliding scale CBG 100-150; 4 units, 151-200; 6 units, 201-250; 8 units, 251-300; 10 units, 301-350; 12 units, 351-400; 14 units, > 401; call doctor  . LANTUS SOLOSTAR 100 UNIT/ML Solostar Pen Inject 10 Units into the skin 2 (two) times  daily.   . metoprolol tartrate (LOPRESSOR) 25 MG tablet Take 1 tablet (25 mg total) by mouth 2 (two) times daily.  . nitroGLYCERIN (NITROSTAT) 0.4 MG SL tablet PLACE 1 TABLET (0.4 MG TOTAL) UNDER THE TONGUE EVERY 5 (FIVE) MINUTES AS NEEDED FOR CHEST PAIN.  Marland Kitchen ondansetron (ZOFRAN) 4 MG tablet TAKE 1 TABLET (4 MG TOTAL) BY MOUTH EVERY 6 (SIX) HOURS.  Marland Kitchen pantoprazole (PROTONIX) 40 MG tablet Take 40 mg by mouth daily.  . prednisoLONE acetate (PRED FORTE) 1 % ophthalmic suspension Place 4-5 drops into the left eye daily.  . rosuvastatin (CRESTOR) 40 MG tablet TAKE 1 TABLET BY MOUTH EVERY DAY  . sertraline (ZOLOFT) 100 MG tablet 1 1/2  tabs by mouth daily  . traMADol (ULTRAM) 50 MG tablet TAKE 1 TABLET EVERY 6 HOURS AS NEEDED FOR MODERATE PAIN (Patient taking differently: Take 50 mg by mouth every 6 (six) hours as needed for moderate pain. )   Allergies  Allergen Reactions  . Aspirin Other (See Comments)    Stomach bleeds Stomach bleeds GI bleeding Other reaction(s): Other (See Comments) GI bleeding Stomach bleeds      Review of Systems    Objective:   BP 122/70 (BP Location: Left Arm, Patient Position: Sitting, Cuff Size: Normal)   Pulse 72   Resp 12   Ht '5\' 1"'  (1.549 m)   Wt 149 lb (67.6  kg)   LMP 01/12/2015   BMI 28.15 kg/m   Physical Exam  NAD Groin area with several mildly erythematous that appear to be deroofed vesicles and more so pustules and perifollicular swelling with erythema. Two pustules opened and culture swabs taken.  No tenderness   Assessment & Plan  1.  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 Discussed should not use the latter in the skin fold area unless show lesions to a provider first. Doxycycline 100 mg twice daily for 10 days. Gentle washing with Dial soap and pat dry at least twice daily. Call if no improvement or if worsens. Cannot recall her last A1C, but believes it was fine.  2.  Chronically elevated Alkaline phosphatase, but  significantly higher recently.  GGT was normal, so unlikely liver source. Check Vitamin D and PTH.  Calcium mildly elevated with normal serum albumin.

## 2019-02-28 LAB — WOUND CULTURE: Organism ID, Bacteria: NONE SEEN

## 2019-03-03 ENCOUNTER — Other Ambulatory Visit: Payer: Self-pay

## 2019-03-03 ENCOUNTER — Ambulatory Visit: Payer: Medicaid Other | Admitting: Podiatry

## 2019-03-03 ENCOUNTER — Encounter: Payer: Self-pay | Admitting: Podiatry

## 2019-03-03 DIAGNOSIS — M79675 Pain in left toe(s): Secondary | ICD-10-CM

## 2019-03-03 DIAGNOSIS — M79674 Pain in right toe(s): Secondary | ICD-10-CM | POA: Diagnosis not present

## 2019-03-03 DIAGNOSIS — E119 Type 2 diabetes mellitus without complications: Secondary | ICD-10-CM

## 2019-03-03 DIAGNOSIS — L84 Corns and callosities: Secondary | ICD-10-CM | POA: Diagnosis not present

## 2019-03-03 DIAGNOSIS — B351 Tinea unguium: Secondary | ICD-10-CM

## 2019-03-03 IMAGING — DX DG OS CALCIS 2+V*L*
2 series · 2 of 2 positions shown · non-contrast
Comparison: Radiographs dated 02/15/2018 and MRI dated 02/16/2018

CLINICAL DATA: Nonhealing wound on the lateral aspect of the left
heel for 5 months.

EXAM:
LEFT OS CALCIS - 2+ VIEW

[calcaneus axial]
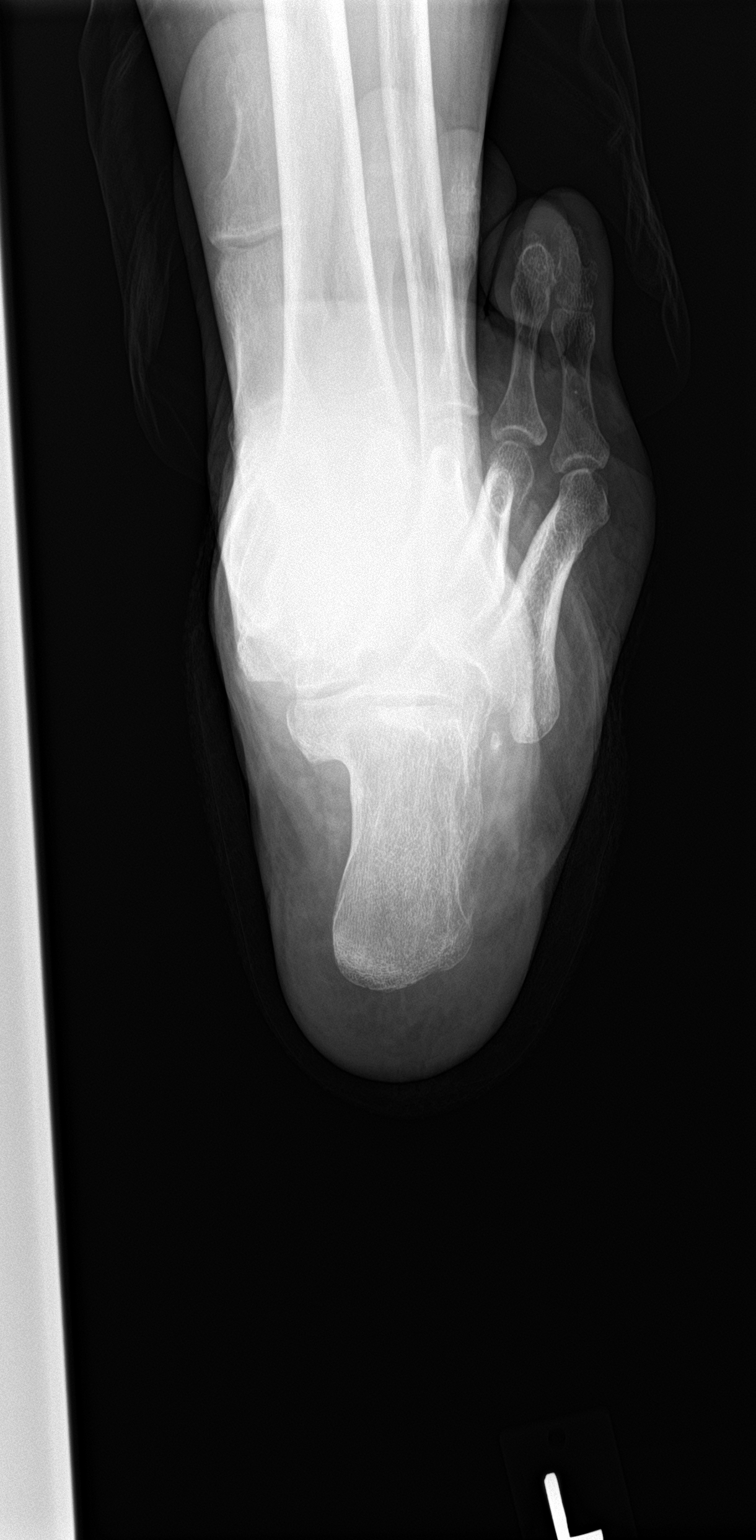

[calcaneus lat]
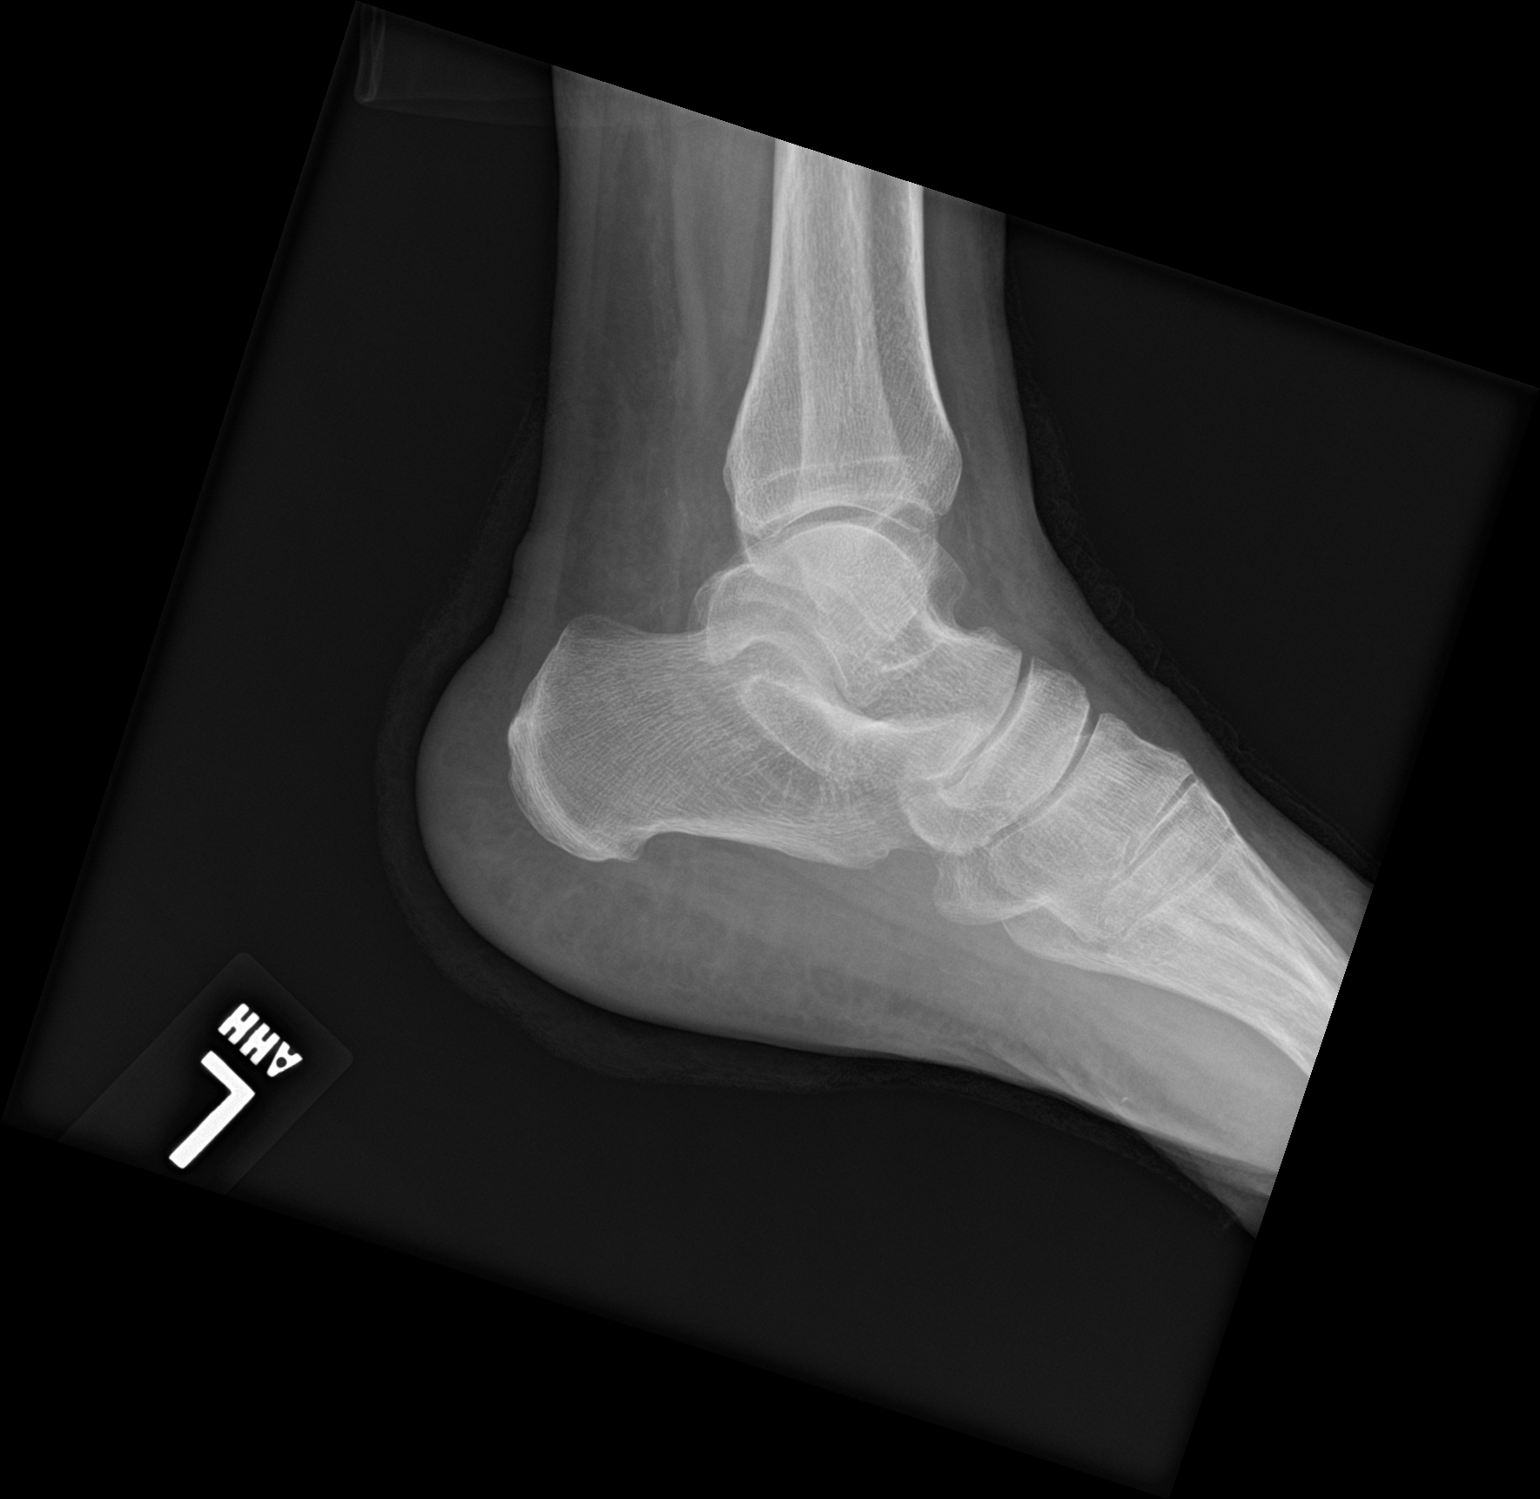

[2 of 2 positions shown; findings below may reference images not displayed]

FINDINGS: There is no evidence of fracture or other focal bone lesions. The
soft tissue ulceration is not apparent on these radiographs.
IMPRESSION: No evidence of osteomyelitis or other bone abnormality.

## 2019-03-05 NOTE — Progress Notes (Signed)
Subjective: 53 y.o. returns the office today for painful, elongated, thickened toenails which she cannot trim herself and for foot exam. Denies any redness or drainage around the nails. Denies any acute changes since last appointment and no new complaints today. Denies any systemic complaints such as fevers, chills, nausea, vomiting.   She was recently just given an antibiotic for rash last Wednesday.  She is on doxycycline.  PCP: Mack Hook, MD Objective: AAO 3, NAD DP/PT pulses palpable, CRT less than 3 seconds Nails hypertrophic, dystrophic, elongated, brittle, discolored 10. There is tenderness overlying the nails 1-5 bilaterally. There is no surrounding erythema or drainage along the nail sites. Hyperkeratotic lesion left foot submetatarsal 5.  No ongoing ulceration drainage or signs of infection. No other areas of tenderness bilateral lower extremities. No overlying edema, erythema, increased warmth. No pain with calf compression, swelling, warmth, erythema.  Assessment: Patient presents with symptomatic onychomycosis, hyperkeratotic lesion  Plan: -Treatment options including alternatives, risks, complications were discussed -Nails sharply debrided 10 without complication/bleeding. -Hyperkeratotic lesion sharply debrided x1 without any complications or bleeding -Discussed daily foot inspection. If there are any changes, to call the office immediately.  -Follow-up in 3 months or sooner if any problems are to arise. In the meantime, encouraged to call the office with any questions, concerns, changes symptoms.  Celesta Gentile, DPM

## 2019-03-06 ENCOUNTER — Telehealth: Payer: Self-pay

## 2019-03-06 MED ORDER — SULFAMETHOXAZOLE-TRIMETHOPRIM 800-160 MG PO TABS: 1.0000 | ORAL_TABLET | Freq: Two times a day (BID) | ORAL | 0 refills | Status: AC

## 2019-03-06 NOTE — Telephone Encounter (Signed)
Patient called stating she is still having a rash. Per Dr. Amil Amen have patient not wear underwear and make sure the area stays dry. Patient states she has been doing this and the rash is now spreading to her bottom. Per Dr. Amil Amen have patient stop doxy and start Bactrim DS 1 twice a day for 10 days. Spoke with patient and she verbalized understanding of stopping the doxy and starting bactrim. [facility].

## 2019-03-12 ENCOUNTER — Telehealth: Payer: Self-pay | Admitting: Internal Medicine

## 2019-03-12 NOTE — Telephone Encounter (Signed)
Patient called to let Dr. Amil Amen know that she went to see a dermatologist and was given a diagnosis of "diabetic dermotological bumps".  Patient states the dermatologist told her to use Neosporin for the "bumps" and she would like Dr. Amil Amen to contact her to let her know if it is "okay to use the Neosporin."

## 2019-03-12 NOTE — Telephone Encounter (Signed)
Per Dr. Amil Amen ok to use Neosporin. Patient informed.

## 2019-03-20 ENCOUNTER — Telehealth: Payer: Self-pay | Admitting: Internal Medicine

## 2019-03-20 NOTE — Telephone Encounter (Signed)
Patient call requesting a call from [facility] to get clarification on medication that was prescribed by a Dermatologist for bumps. Antony Madura advised patient to call provider who prescribed medication but patient states was told needs to flu with PCP on that.  Routing to Cherice to call  Patient.

## 2019-03-21 NOTE — Telephone Encounter (Signed)
Spoke with patient. Dermatology started patient on Xyzal. States it is clearing up her rash now that she is taking it.

## 2019-03-24 ENCOUNTER — Other Ambulatory Visit: Payer: Self-pay | Admitting: Interventional Cardiology

## 2019-04-09 ENCOUNTER — Telehealth: Payer: Self-pay

## 2019-04-09 NOTE — Telephone Encounter (Signed)
Patient called stating she has been having right hand numbness and pain x 3 days. States she also having sugar levels all over the place. States 2 night ago she took a muscle relaxer and sugar level bottomed out at 25. States she ate and levels shot up to 307. States her levels have been going up and down like this for the last 2-3 days. Patient not sure if she needs to contact her endocrinologist regarding her sugars, but would like to know if she needs to be seen here for her hand.  To Dr. Amil Amen for further directions.

## 2019-04-11 ENCOUNTER — Ambulatory Visit: Payer: Medicaid Other | Admitting: Internal Medicine

## 2019-04-11 NOTE — Telephone Encounter (Signed)
Patient coming in for appointment

## 2019-04-15 ENCOUNTER — Other Ambulatory Visit: Payer: Self-pay

## 2019-04-15 ENCOUNTER — Encounter: Payer: Self-pay | Admitting: Internal Medicine

## 2019-04-15 ENCOUNTER — Ambulatory Visit: Payer: Medicaid Other | Admitting: Internal Medicine

## 2019-04-15 VITALS — BP 124/72 | HR 82 | Resp 12 | Ht 61.0 in | Wt 158.0 lb

## 2019-04-15 DIAGNOSIS — Z79899 Other long term (current) drug therapy: Secondary | ICD-10-CM

## 2019-04-15 DIAGNOSIS — M059 Rheumatoid arthritis with rheumatoid factor, unspecified: Secondary | ICD-10-CM | POA: Diagnosis not present

## 2019-04-15 DIAGNOSIS — Z716 Tobacco abuse counseling: Secondary | ICD-10-CM

## 2019-04-15 DIAGNOSIS — G5601 Carpal tunnel syndrome, right upper limb: Secondary | ICD-10-CM

## 2019-04-15 DIAGNOSIS — E118 Type 2 diabetes mellitus with unspecified complications: Secondary | ICD-10-CM

## 2019-04-15 LAB — GLUCOSE, POCT (MANUAL RESULT ENTRY): POC Glucose: 79 mg/dl (ref 70–99)

## 2019-04-15 NOTE — Progress Notes (Signed)
Subjective:    Patient ID: [name] [name], female   DOB: 1965-11-21, 53 y.o.   MRN: [id number]   HPI   1.  Right hand stiffness, pain and swelling of joints.  Feels her RA is flaring again. MCPs are the most significantly involved.    She has not called her rheumatologist, Dr. Earnest Conroy  Last dose of MTX was taking 7.5 mg Wednesdays and Thursdays.   She does have pills at home to restart.  She does not believe they have expired.  Cannot recall when she last took. Has been taking her folic acid 1 mg daily still.   2.  DM:  Sugars up and down now for about 1 month.  Stopped walking as her weight loss scared her and she felt she looked like she was on Crack again. She is getting the munchies with MJ. Her meals and insulin dosing is still well scheduled. She has gained 9 lbs over past month. She felt fine.    3.  Tobacco:  Going more toward edibles, but also smoking.  Also 5 cigarettes daily.  Tried the patches and despite moving them around, developed inflammation of her skin.   Contacted [employer], but has not followed up.    Current Meds  Medication Sig  . Accu-Chek FastClix Lancets MISC CHECK BLOOD SUGAR 5 TIMES DAILY  . ACCU-CHEK GUIDE test strip USE AS DIRECTED CHECK BLOOD SUGAR 5 TIMES A DAY  . acetaminophen (TYLENOL) 500 MG tablet Take 500-1,000 mg by mouth every 6 (six) hours as needed for moderate pain.   Marland Kitchen albuterol (PROVENTIL HFA;VENTOLIN HFA) 108 (90 Base) MCG/ACT inhaler Inhale 2 puffs into the lungs every 6 (six) hours as needed for wheezing or shortness of breath.  . Blood Glucose Monitoring Suppl (ACCU-CHEK GUIDE ME) w/Device KIT USE TO MONITOR BLOOD SUGAR FIVE TIMES A DAY DX  E10.22 IN VITRO  . clopidogrel (PLAVIX) 75 MG tablet TAKE 1 TABLET BY MOUTH EVERY DAY  . cyclobenzaprine (FLEXERIL) 10 MG tablet Take 1-2 tablets by mouth at bedtime as needed. Muscle spasms  . diphenhydrAMINE (BENADRYL) 25 MG tablet Take 25 mg by mouth every 6 (six) hours as needed.  . folic acid  (FOLVITE) 1 MG tablet Take 1 mg by mouth daily.  . furosemide (LASIX) 40 MG tablet Take 1 tablet (40 mg total) by mouth every morning.  . Incontinence Supply Disposable (POISE ULTRA THINS) PADS Use 5 pads daily as needed for urinary stress incontinence  . insulin aspart (NOVOLOG) 100 UNIT/ML injection Inject 4-16 Units into the skin 3 (three) times daily with meals. Sliding scale CBG 100-150; 4 units, 151-200; 6 units, 201-250; 8 units, 251-300; 10 units, 301-350; 12 units, 351-400; 14 units, > 401; call doctor  . LANTUS SOLOSTAR 100 UNIT/ML Solostar Pen Inject 10 Units into the skin 2 (two) times daily.   Marland Kitchen levocetirizine (XYZAL) 5 MG tablet Take 5 mg by mouth daily.  . metoprolol tartrate (LOPRESSOR) 25 MG tablet Take 1 tablet (25 mg total) by mouth 2 (two) times daily.  . nitroGLYCERIN (NITROSTAT) 0.4 MG SL tablet PLACE 1 TABLET (0.4 MG TOTAL) UNDER THE TONGUE EVERY 5 (FIVE) MINUTES AS NEEDED FOR CHEST PAIN.  Marland Kitchen ondansetron (ZOFRAN) 4 MG tablet TAKE 1 TABLET (4 MG TOTAL) BY MOUTH EVERY 6 (SIX) HOURS.  Marland Kitchen pantoprazole (PROTONIX) 40 MG tablet Take 40 mg by mouth daily.  . prednisoLONE acetate (PRED FORTE) 1 % ophthalmic suspension Place 4-5 drops into the left eye daily.  Marland Kitchen  rosuvastatin (CRESTOR) 40 MG tablet TAKE 1 TABLET BY MOUTH EVERY DAY  . sertraline (ZOLOFT) 100 MG tablet 1 1/2  tabs by mouth daily  . traMADol (ULTRAM) 50 MG tablet TAKE 1 TABLET EVERY 6 HOURS AS NEEDED FOR MODERATE PAIN (Patient taking differently: Take 50 mg by mouth every 6 (six) hours as needed for moderate pain. )  . triamcinolone cream (KENALOG) 0.1 % APPLY ON THE SKIN TWICE A DAY X2 WEEKS, TAKE 2 WEEKS OFF THEN REPEAT   Allergies  Allergen Reactions  . Aspirin Other (See Comments)    Stomach bleeds Stomach bleeds GI bleeding Other reaction(s): Other (See Comments) GI bleeding Stomach bleeds      Review of Systems    Objective:   BP 124/72 (BP Location: Left Arm, Patient Position: Sitting, Cuff Size:  Normal)   Pulse 82   Resp 12   Ht '5\' 1"'  (1.549 m)   Wt 158 lb (71.7 kg)   LMP 01/12/2015   BMI 29.85 kg/m   Physical Exam   NAD Lungs:  CTA CV:  RRR without murmur or rub.  Radial and DP pulses normal and equal. Abd:  S, NT No HSM or mass, + BS MS:  Difficulty making a tight fist.  Tender with mild bogginess of MCPs, especially right hand.  + tinels and phalens, right wrist.   Assessment & Plan  1.  RA:  Restart MTX.  Check CBC, CMP baseline.  Continue Folic Acid daily.  Follow up in 2 months.  Encouraged her to notify Dr. Earnest Conroy, Rheum Cock up splint at night for Right Carpal Tunnel as well.  Suspect will improve with decreased RA inflammation.  2.  DM:  Concerned with her lack of physical activity and continued difficulties with diet.  A1C. Discussed in reality, she could lose some weight and have improved glucose control. Hopefully as RA gets under better control again, she can become more active.  3.  Tobacco counseling: again, to try nicotine lozenges or gum if cannot tolerate patches.  Continue to move to edibles.

## 2019-04-16 LAB — COMPREHENSIVE METABOLIC PANEL
ALT: 5 IU/L (ref 0–32)
AST: 17 IU/L (ref 0–40)
Albumin/Globulin Ratio: 1.5 (ref 1.2–2.2)
Albumin: 5 g/dL — ABNORMAL HIGH (ref 3.8–4.9)
Alkaline Phosphatase: 221 IU/L — ABNORMAL HIGH (ref 39–117)
BUN/Creatinine Ratio: 23 (ref 9–23)
BUN: 27 mg/dL — ABNORMAL HIGH (ref 6–24)
Bilirubin Total: 0.6 mg/dL (ref 0.0–1.2)
CO2: 19 mmol/L — ABNORMAL LOW (ref 20–29)
Calcium: 10.2 mg/dL (ref 8.7–10.2)
Chloride: 99 mmol/L (ref 96–106)
Creatinine, Ser: 1.16 mg/dL — ABNORMAL HIGH (ref 0.57–1.00)
GFR calc Af Amer: 63 mL/min/{1.73_m2} (ref >59)
GFR calc non Af Amer: 54 mL/min/{1.73_m2} — ABNORMAL LOW (ref >59)
Globulin, Total: 3.4 g/dL (ref 1.5–4.5)
Glucose: 229 mg/dL — ABNORMAL HIGH (ref 65–99)
Potassium: 4.2 mmol/L (ref 3.5–5.2)
Sodium: 138 mmol/L (ref 134–144)
Total Protein: 8.4 g/dL (ref 6.0–8.5)

## 2019-04-16 LAB — CBC WITH DIFFERENTIAL/PLATELET
Basophils Absolute: 0 10*3/uL (ref 0.0–0.2)
Basos: 0 %
EOS (ABSOLUTE): 0.1 10*3/uL (ref 0.0–0.4)
Eos: 1 %
Hematocrit: 44.5 % (ref 34.0–46.6)
Hemoglobin: 14.4 g/dL (ref 11.1–15.9)
Immature Grans (Abs): 0 10*3/uL (ref 0.0–0.1)
Immature Granulocytes: 1 %
Lymphocytes Absolute: 2.6 10*3/uL (ref 0.7–3.1)
Lymphs: 32 %
MCH: 29 pg (ref 26.6–33.0)
MCHC: 32.4 g/dL (ref 31.5–35.7)
MCV: 90 fL (ref 79–97)
Monocytes Absolute: 0.5 10*3/uL (ref 0.1–0.9)
Monocytes: 6 %
Neutrophils Absolute: 5.1 10*3/uL (ref 1.4–7.0)
Neutrophils: 60 %
Platelets: 217 10*3/uL (ref 150–450)
RBC: 4.96 x10E6/uL (ref 3.77–5.28)
RDW: 14.9 % (ref 11.7–15.4)
WBC: 8.4 10*3/uL (ref 3.4–10.8)

## 2019-04-16 LAB — HGB A1C W/O EAG: Hgb A1c MFr Bld: 7 % — ABNORMAL HIGH (ref 4.8–5.6)

## 2019-05-06 ENCOUNTER — Other Ambulatory Visit: Payer: Self-pay | Admitting: Internal Medicine

## 2019-05-07 ENCOUNTER — Telehealth: Payer: Self-pay

## 2019-05-07 NOTE — Telephone Encounter (Signed)
Patient called stating Dr. Amil Amen had given her a cream that she can put on her buttock for rash she is having. Patient did not remember the name of the cream and I did not see it listed in her med list. Patient states the rash in the crack and is driving her crazy.  Patient [facility]  To Dr. Amil Amen for further directions

## 2019-05-09 ENCOUNTER — Other Ambulatory Visit: Payer: Self-pay

## 2019-05-09 ENCOUNTER — Encounter: Payer: Self-pay | Admitting: Internal Medicine

## 2019-05-09 ENCOUNTER — Ambulatory Visit: Payer: Medicaid Other | Admitting: Internal Medicine

## 2019-05-09 VITALS — BP 122/78 | HR 80 | Resp 12 | Ht 61.0 in | Wt 155.0 lb

## 2019-05-09 DIAGNOSIS — B356 Tinea cruris: Secondary | ICD-10-CM

## 2019-05-09 MED ORDER — FLUCONAZOLE 150 MG PO TABS: ORAL_TABLET | ORAL | 0 refills | Status: DC

## 2019-05-09 NOTE — Patient Instructions (Signed)
Consider using Lotrimin or Terbinafine cream to skin twice daily as well.

## 2019-05-09 NOTE — Telephone Encounter (Signed)
Per Dr. Amil Amen patient needs to be seen. Patient scheduled for appointment to day at 4 pm

## 2019-05-09 NOTE — Progress Notes (Signed)
Subjective:    Patient ID: [name] [name], female   DOB: Mar 17, 1966, 53 y.o.   MRN: [id number]   HPI   Very itchy rash in bilateral groin for about 1 month.  Extended to between her buttocks about 2 weeks ago.   Has not used any topicals on this Using Dove soap and trying to keep clean and dry.  Going without underwear.     Current Meds  Medication Sig  . Accu-Chek FastClix Lancets MISC CHECK BLOOD SUGAR 5 TIMES DAILY  . ACCU-CHEK GUIDE test strip USE AS DIRECTED CHECK BLOOD SUGAR 5 TIMES A DAY  . acetaminophen (TYLENOL) 500 MG tablet Take 500-1,000 mg by mouth every 6 (six) hours as needed for moderate pain.   Marland Kitchen albuterol (PROVENTIL HFA;VENTOLIN HFA) 108 (90 Base) MCG/ACT inhaler Inhale 2 puffs into the lungs every 6 (six) hours as needed for wheezing or shortness of breath.  . Blood Glucose Monitoring Suppl (ACCU-CHEK GUIDE ME) w/Device KIT USE TO MONITOR BLOOD SUGAR FIVE TIMES A DAY DX  E10.22 IN VITRO  . clopidogrel (PLAVIX) 75 MG tablet TAKE 1 TABLET BY MOUTH EVERY DAY  . cyclobenzaprine (FLEXERIL) 10 MG tablet Take 1-2 tablets by mouth at bedtime as needed. Muscle spasms  . diphenhydrAMINE (BENADRYL) 25 MG tablet Take 25 mg by mouth every 6 (six) hours as needed.  . folic acid (FOLVITE) 1 MG tablet Take 1 mg by mouth daily.  . furosemide (LASIX) 40 MG tablet Take 1 tablet (40 mg total) by mouth every morning.  . Incontinence Supply Disposable (POISE ULTRA THINS) PADS Use 5 pads daily as needed for urinary stress incontinence  . insulin aspart (NOVOLOG) 100 UNIT/ML injection Inject 4-16 Units into the skin 3 (three) times daily with meals. Sliding scale CBG 100-150; 4 units, 151-200; 6 units, 201-250; 8 units, 251-300; 10 units, 301-350; 12 units, 351-400; 14 units, > 401; call doctor  . LANTUS SOLOSTAR 100 UNIT/ML Solostar Pen Inject 10 Units into the skin 2 (two) times daily.   . methotrexate (RHEUMATREX) 2.5 MG tablet Take by mouth. 4 tablets twice a week  . metoprolol tartrate  (LOPRESSOR) 25 MG tablet Take 1 tablet (25 mg total) by mouth 2 (two) times daily.  . nitroGLYCERIN (NITROSTAT) 0.4 MG SL tablet PLACE 1 TABLET (0.4 MG TOTAL) UNDER THE TONGUE EVERY 5 (FIVE) MINUTES AS NEEDED FOR CHEST PAIN.  Marland Kitchen ondansetron (ZOFRAN) 4 MG tablet TAKE 1 TABLET (4 MG TOTAL) BY MOUTH EVERY 6 (SIX) HOURS.  Marland Kitchen pantoprazole (PROTONIX) 40 MG tablet Take 40 mg by mouth daily.  . prednisoLONE acetate (PRED FORTE) 1 % ophthalmic suspension Place 4-5 drops into the left eye daily.  . rosuvastatin (CRESTOR) 40 MG tablet TAKE 1 TABLET BY MOUTH EVERY DAY  . sertraline (ZOLOFT) 100 MG tablet 1 1/2  tabs by mouth daily  . traMADol (ULTRAM) 50 MG tablet TAKE 1 TABLET EVERY 6 HOURS AS NEEDED FOR MODERATE PAIN (Patient taking differently: Take 50 mg by mouth every 6 (six) hours as needed for moderate pain. )   Allergies  Allergen Reactions  . Aspirin Other (See Comments)    Stomach bleeds Stomach bleeds GI bleeding Other reaction(s): Other (See Comments) GI bleeding Stomach bleeds      Review of Systems    Objective:   BP 122/78 (BP Location: Left Arm, Patient Position: Sitting, Cuff Size: Normal)   Pulse 80   Resp 12   Ht '5\' 1"'  (1.549 m)   Wt 155 lb (70.3 kg)  LMP 01/12/2015   BMI 29.29 kg/m   Physical Exam  Papular lesions scattered all over legs and arms is resolving with 5 mm hyperpigmented circular nonpalpable areas left. Groin and skin fold between buttocks with shininess, pink, flaking and mild clear discharge.   Assessment & Plan  Tinea cruris:  Fluconazole 150 mg daily for 5 day.   May use topical such as Lotrimin or Terbinafine twice daily to speed healing.

## 2019-05-13 DIAGNOSIS — B356 Tinea cruris: Secondary | ICD-10-CM

## 2019-05-13 HISTORY — DX: Tinea cruris: B35.6

## 2019-05-19 ENCOUNTER — Encounter: Payer: Self-pay | Admitting: Internal Medicine

## 2019-05-19 ENCOUNTER — Encounter: Payer: Medicaid Other | Admitting: Internal Medicine

## 2019-05-19 ENCOUNTER — Other Ambulatory Visit: Payer: Self-pay

## 2019-05-19 NOTE — Progress Notes (Signed)
    Subjective:    Patient ID: [name] [name], female   DOB: 1966/06/06, 53 y.o.   MRN: [id number]   HPI  Patient a couple of month early to get her pap covered.  She is fine with coming back after 12.29/2020 for her CPE with pap.    Physical Exam   Assessment & Plan   No exam done--no charge.

## 2019-05-30 ENCOUNTER — Encounter: Payer: Medicaid Other | Admitting: Internal Medicine

## 2019-06-02 ENCOUNTER — Telehealth: Payer: Self-pay | Admitting: Internal Medicine

## 2019-06-02 NOTE — Telephone Encounter (Signed)
Spoke with patient. States she has been having nausea, vomiting and slight headache x 5 days now. States she does have a appetite but can not keep anything down. Patient denies any fever, cough or diarrhea. Asked patient if she had been tested for COVID and she states she was tested that Monday before her symptoms started. States she called her GI doctor and they are trying to work her in for appointment. In formed I would speak to Dr. Amil Amen regarding this and call her back. Patient informed Dr. Amil Amen is out of office until this afternoon.

## 2019-06-02 NOTE — Telephone Encounter (Signed)
Spoke with patient. Advised per Dr. Amil Amen to go to ER as well. Patient verbalized understanding.

## 2019-06-02 NOTE — Telephone Encounter (Signed)
Spoke with patient states her sugars have been 165,155,219 and 298. States the 298 was because she was drinking ginger ale to help settle her stomach. Patient states her GI doctor called back and advised her to go to the ER.

## 2019-06-02 NOTE — Telephone Encounter (Signed)
Patient called stating has been throwing up x three days and is having stomach discomfort. Patient also experiencing mild headache and stated called  her GI Dr. And was told they do not have any opening to see her any time soon; patient  and was advised by them to contact her PCP.  To Cherice to contact and advise patient.

## 2019-06-03 ENCOUNTER — Other Ambulatory Visit: Payer: Self-pay

## 2019-06-03 ENCOUNTER — Encounter: Payer: Self-pay | Admitting: Podiatry

## 2019-06-03 ENCOUNTER — Ambulatory Visit (INDEPENDENT_AMBULATORY_CARE_PROVIDER_SITE_OTHER): Payer: Medicaid Other | Admitting: Podiatry

## 2019-06-03 DIAGNOSIS — L989 Disorder of the skin and subcutaneous tissue, unspecified: Secondary | ICD-10-CM

## 2019-06-03 DIAGNOSIS — M79674 Pain in right toe(s): Secondary | ICD-10-CM

## 2019-06-03 DIAGNOSIS — E1149 Type 2 diabetes mellitus with other diabetic neurological complication: Secondary | ICD-10-CM

## 2019-06-03 DIAGNOSIS — B351 Tinea unguium: Secondary | ICD-10-CM

## 2019-06-03 DIAGNOSIS — M79675 Pain in left toe(s): Secondary | ICD-10-CM

## 2019-06-03 NOTE — Progress Notes (Signed)
Subjective: 53 y.o. returns the office today for painful, elongated, thickened toenails which she cannot trim herself and for calluses which are painful. Denies any redness or drainage around the nails/calluses. Denies any acute changes since last appointment and no new complaints today. Denies any systemic complaints such as fevers, chills, nausea, vomiting.   PCP: Mack Hook, MD- last seen 05/09/2019 A1c: 7 (04/15/2019)  Objective: AAO 3, NAD DP/PT pulses palpable, CRT less than 3 seconds Sensation decreased with Semmes Weinstein monofilament. Nails hypertrophic, dystrophic, elongated, brittle, discolored 10. There is tenderness overlying the nails 1-5 bilaterally. There is no surrounding erythema or drainage along the nail sites. Hyperkeratotic lesion left foot submetatarsal 5 and right 5th toe on the dorsal lateral digit.  No ongoing ulceration drainage or signs of infection. No other areas of tenderness bilateral lower extremities. No overlying edema, erythema, increased warmth. No pain with calf compression, swelling, warmth, erythema.  Assessment: Patient presents with symptomatic onychomycosis, hyperkeratotic lesion  Plan: -Treatment options including alternatives, risks, complications were discussed -Nails sharply debrided 10 without complication/bleeding. -Hyperkeratotic lesion sharply debrided x2 without any complications or bleeding -Discussed daily foot inspection. If there are any changes, to call the office immediately.  -Follow-up in 3 months or sooner if any problems are to arise. In the meantime, encouraged to call the office with any questions, concerns, changes symptoms.  Celesta Gentile, DPM

## 2019-06-11 ENCOUNTER — Encounter: Payer: Medicaid Other | Admitting: Internal Medicine

## 2019-06-19 ENCOUNTER — Other Ambulatory Visit: Payer: Self-pay | Admitting: Internal Medicine

## 2019-07-07 ENCOUNTER — Telehealth: Payer: Self-pay | Admitting: Internal Medicine

## 2019-07-07 NOTE — Telephone Encounter (Signed)
Patient called stating Medicaid is requesting information from [place] on why patient needs incontinence pads. Antony Madura advise patient to call them back and let them know if they can poossibly send [place] a fax over to [place] with the right/specific information they are requesting or a form that can be filled out. Patient called back and stated was told by them that we have to call them directly  and provide that information.   The phone number to call is [phone]   Please advise.

## 2019-07-10 NOTE — Telephone Encounter (Signed)
Form has been filled out and sent tback

## 2019-07-11 ENCOUNTER — Other Ambulatory Visit: Payer: Self-pay | Admitting: Internal Medicine

## 2019-07-11 DIAGNOSIS — I10 Essential (primary) hypertension: Secondary | ICD-10-CM

## 2019-07-19 IMAGING — MG DIGITAL SCREENING BILATERAL MAMMOGRAM WITH TOMO AND CAD
6 of 10 series · 6 of 30 positions shown · non-contrast
Comparison: Previous exam(s).

CLINICAL DATA: Screening.

EXAM:
DIGITAL SCREENING BILATERAL MAMMOGRAM WITH TOMO AND CAD

[R MLO synth-2D (1 of 2)]
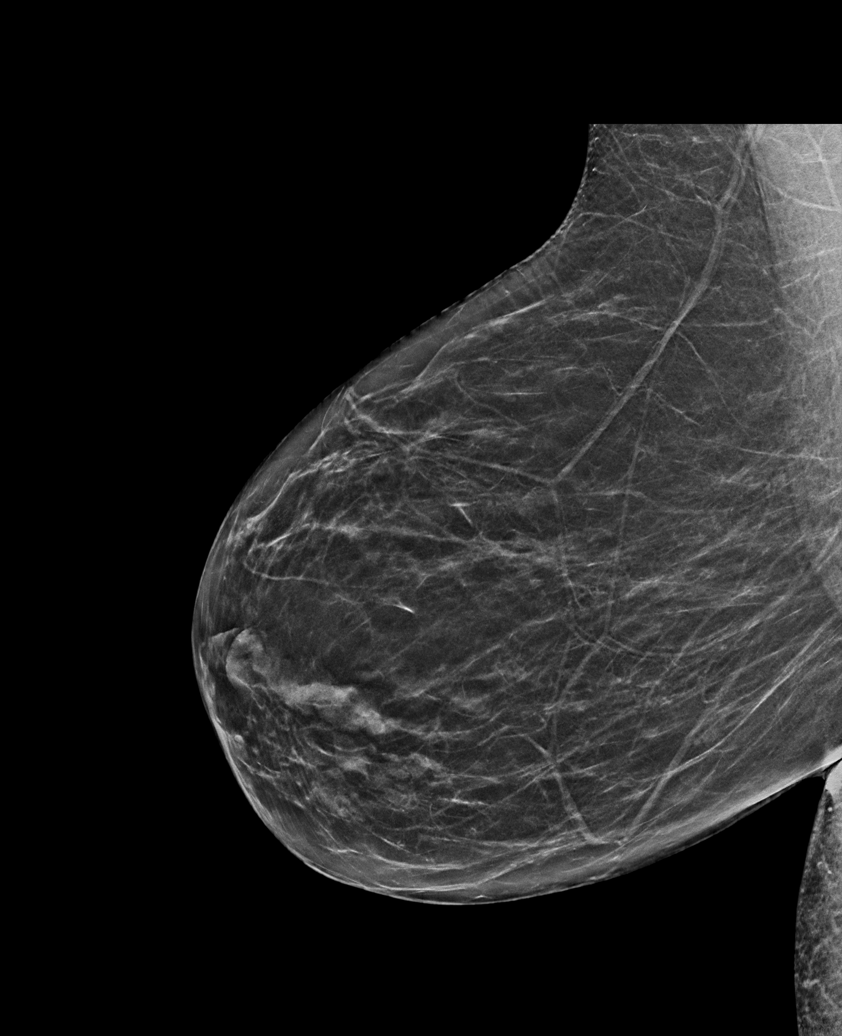

[R CC synth-2D]
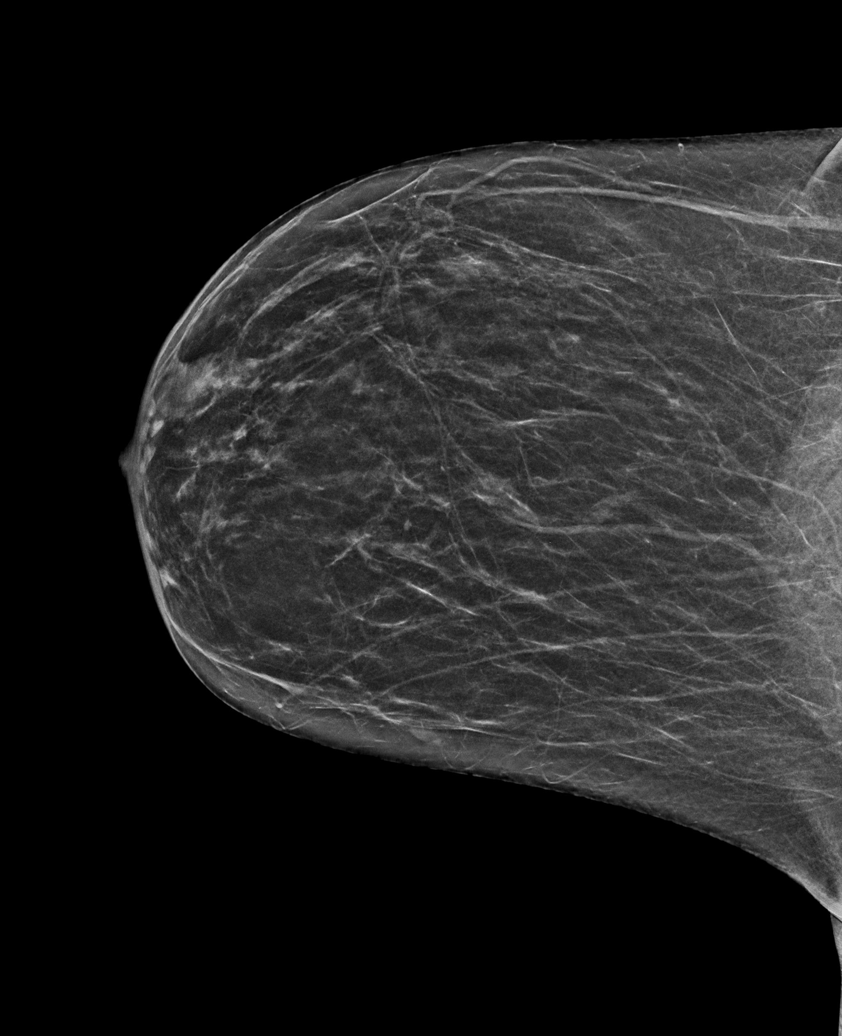

[L MLO synth-2D]
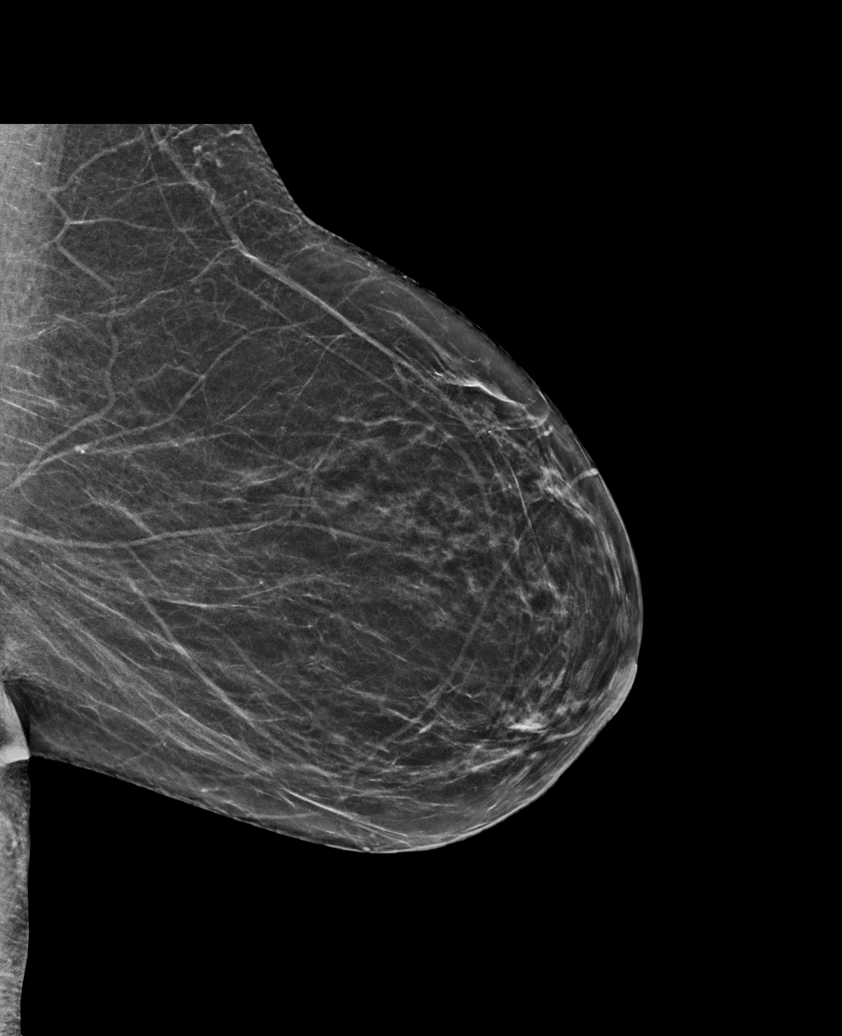

[R MLO synth-2D (2 of 2)]
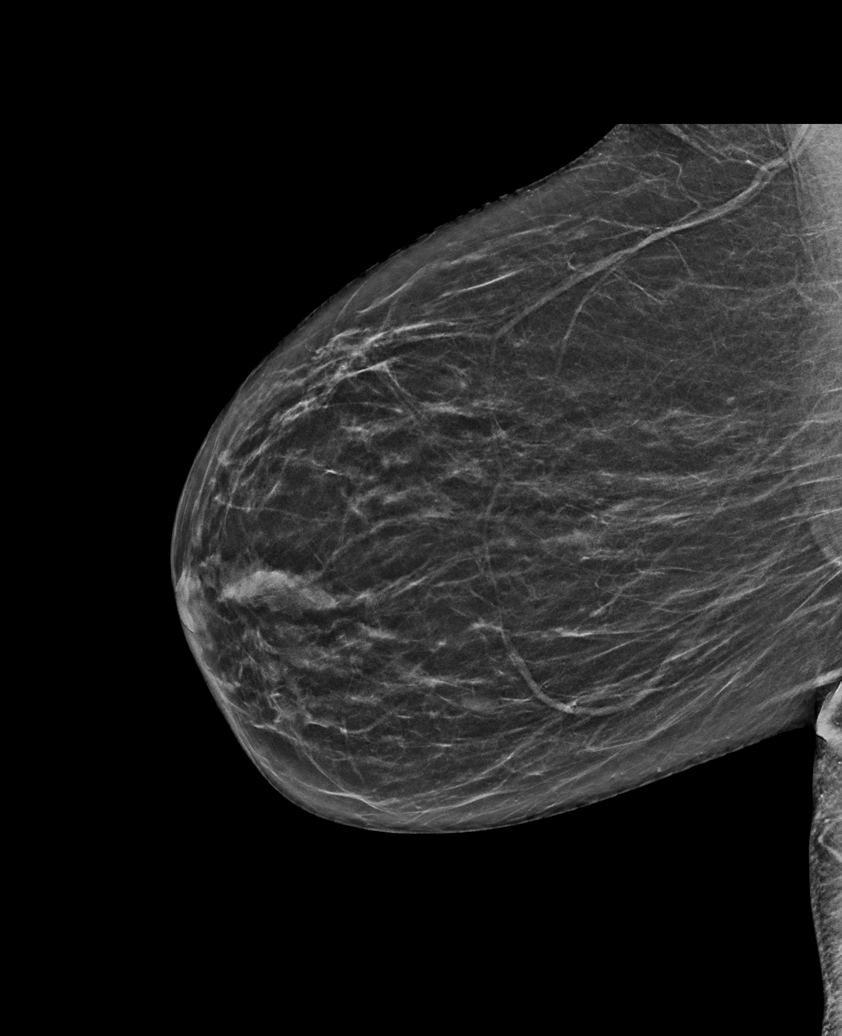

[L CC synth-2D]
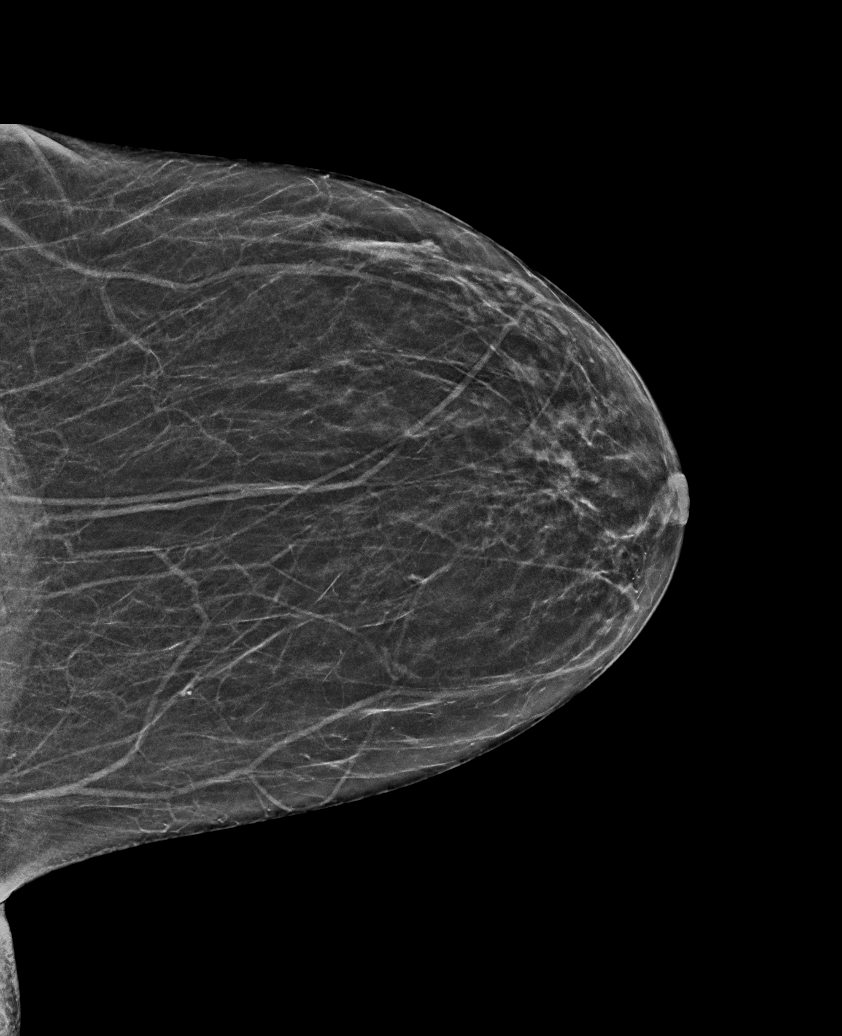

[L MLO tomo · tomo slice 27/53.0]
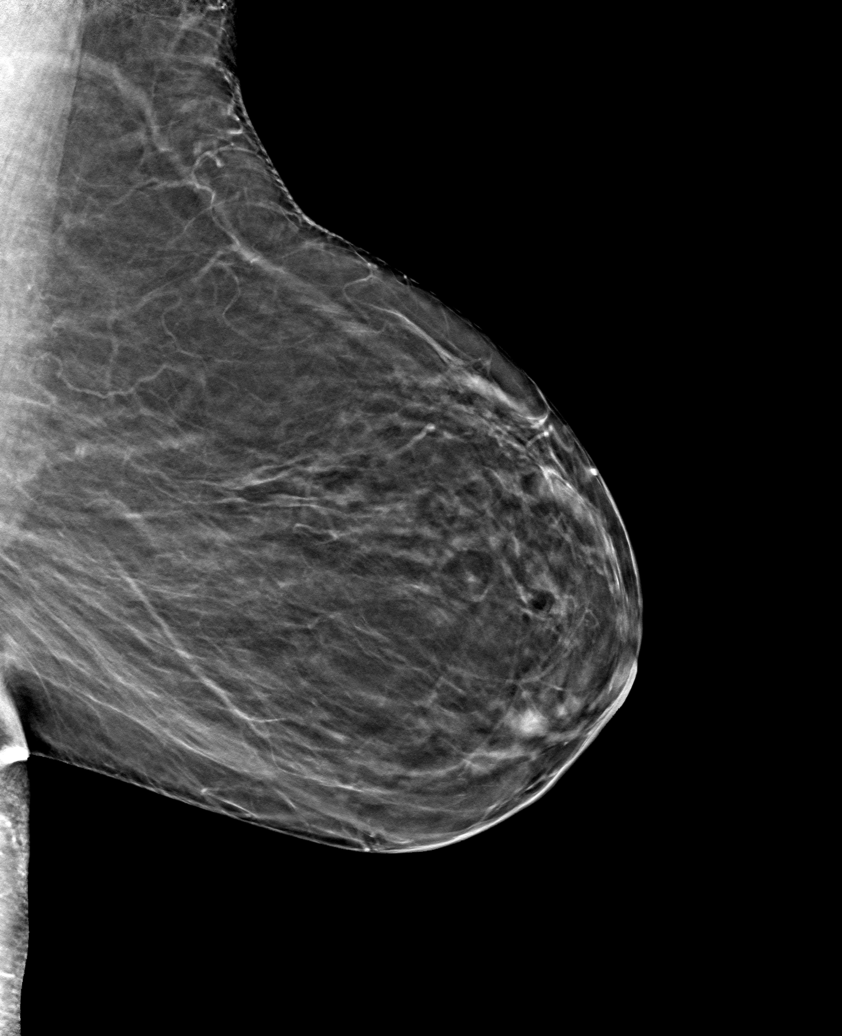

[6 of 30 positions shown; findings below may reference images not displayed]

ACR Breast Density Category b: There are scattered areas of
fibroglandular density.
FINDINGS: There are no findings suspicious for malignancy. Images were
processed with CAD.
IMPRESSION: No mammographic evidence of malignancy. A result letter of this
screening mammogram will be mailed directly to the patient.

RECOMMENDATION:
Screening mammogram in one year. (Code:[id number])

BI-RADS CATEGORY  1: Negative.

## 2019-07-29 ENCOUNTER — Encounter: Payer: Self-pay | Admitting: Internal Medicine

## 2019-07-29 ENCOUNTER — Other Ambulatory Visit: Payer: Self-pay

## 2019-07-29 ENCOUNTER — Ambulatory Visit (INDEPENDENT_AMBULATORY_CARE_PROVIDER_SITE_OTHER): Payer: Medicaid Other | Admitting: Internal Medicine

## 2019-07-29 VITALS — BP 122/78 | HR 80 | Resp 12 | Ht 61.0 in | Wt 142.0 lb

## 2019-07-29 DIAGNOSIS — F4329 Adjustment disorder with other symptoms: Secondary | ICD-10-CM

## 2019-07-29 DIAGNOSIS — F329 Major depressive disorder, single episode, unspecified: Secondary | ICD-10-CM | POA: Diagnosis not present

## 2019-07-29 DIAGNOSIS — R8281 Pyuria: Secondary | ICD-10-CM

## 2019-07-29 DIAGNOSIS — E118 Type 2 diabetes mellitus with unspecified complications: Secondary | ICD-10-CM | POA: Diagnosis not present

## 2019-07-29 DIAGNOSIS — R112 Nausea with vomiting, unspecified: Secondary | ICD-10-CM

## 2019-07-29 LAB — POCT URINALYSIS DIPSTICK
Glucose, UA: POSITIVE — AB
Ketones, UA: NEGATIVE
Nitrite, UA: NEGATIVE
Protein, UA: POSITIVE — AB
Spec Grav, UA: 1.02 (ref 1.010–1.025)
Urobilinogen, UA: 1 E.U./dL
pH, UA: 6 (ref 5.0–8.0)

## 2019-07-29 NOTE — Progress Notes (Signed)
Subjective:    Patient ID: [name] [name], female   DOB: November 27, 1965, 53 y.o.   MRN: [id number]   HPI   Here for CPE, but patient upset about a lot of issues today and we decided to just address those concerns today.  1.  DM:  Started with the [facility] with continuous sensor about 2-3 weeks ago as her fingers were hurting with frequent checks.  She has had trouble with the sensor not staying on.  The last one fell off her left upper arm this morning. States she feels it is putting medication in her and making her nauseated.  Discussed I do not believe there is any medication from the sensor. She refuses to try the sensor again--She will let Dr. Buddy Duty know. States her A1C in October was 6.9%. .   She states she has been nauseated since placing the last sensor about 12 days ago. Last meal was on Christmas Day prior to kicking her boyfriend out and sounds like she has been skipping most meals and just drinking Gatorade or Pedialyte, but still giving herself Novolog 2-3 times daily.  Basically, chasing her sugars.  She shows me sugars from past 3 days that go up to 220s after bottoming out into 50s.  She just drinks above drinks and nothing else to get her sugar back up briefly.  She has been giving herself insulin based on a 6 a.m. blood sugar at 6 a.m. and then not eating until 9 a.m.  She is eating sugar coated cereals with water or Pedialyte. She eats lunch around 1-2 p.m.--boiled egg with a salad.  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  Water or pedialyte. She has a snack around 10:30 a.m. and 2-3 p.m.--peanut butter and crackers. Checking sugars throughout the day whether she is to eat or not and giving herself Novolog if [place].   No real schedule for checking sugars, giving insulin or eating currently. Her sugars appear to be bottoming out based on her monitor readings, she drinks a sugary drink, sugars come up for a bit, then bottom out again. Has had some Left mid abdominal  discomfort since vomiting yesterday.   No fever.  No real diarrhea.  No melena or hematochezia.  2.  Reactive depression:  53 yo woman she knew was killed in a drive by shooting about the time she started with above continuous sensor.  She has been tearful and having difficulties eating every since. She also had to throw her boyfriend out of the house on Christmas Day as he is using crack cocaine.  Packed his clothes and put them at the door.  She also felt he was increasing her risk for COVID19 as he was going out and not being careful.  She is also missing her grandchild due to distancing with [place]. Has lost 10 lbs since October as not able to eat well in recent weeks.   Current Meds  Medication Sig  . Accu-Chek FastClix Lancets MISC CHECK BLOOD SUGAR 5 TIMES DAILY  . ACCU-CHEK GUIDE test strip USE AS DIRECTED CHECK BLOOD SUGAR 5 TIMES A DAY  . acetaminophen (TYLENOL) 500 MG tablet Take 500-1,000 mg by mouth every 6 (six) hours as needed for moderate pain.   Marland Kitchen albuterol (PROVENTIL HFA;VENTOLIN HFA) 108 (90 Base) MCG/ACT inhaler Inhale 2 puffs into the lungs every 6 (six) hours as needed for wheezing or shortness of breath.  . Blood Glucose Monitoring Suppl (ACCU-CHEK GUIDE ME) w/Device KIT USE  TO MONITOR BLOOD SUGAR FIVE TIMES A DAY DX  E10.22 IN VITRO  . clopidogrel (PLAVIX) 75 MG tablet TAKE 1 TABLET BY MOUTH EVERY DAY  . cyclobenzaprine (FLEXERIL) 10 MG tablet Take 1-2 tablets by mouth at bedtime as needed. Muscle spasms  . diphenhydrAMINE (BENADRYL) 25 MG tablet Take 25 mg by mouth every 6 (six) hours as needed.  . folic acid (FOLVITE) 1 MG tablet Take 1 mg by mouth daily.  . furosemide (LASIX) 40 MG tablet TAKE 1 TABLET BY MOUTH EVERY DAY IN THE MORNING  . Incontinence Supply Disposable (POISE ULTRA THINS) PADS Use 5 pads daily as needed for urinary stress incontinence  . insulin aspart (NOVOLOG) 100 UNIT/ML injection Inject 4-16 Units into the skin 3 (three) times daily with meals.  Sliding scale CBG 100-150; 4 units, 151-200; 6 units, 201-250; 8 units, 251-300; 10 units, 301-350; 12 units, 351-400; 14 units, > 401; call doctor  . LANTUS SOLOSTAR 100 UNIT/ML Solostar Pen Inject 10 Units into the skin 2 (two) times daily.   . methotrexate (RHEUMATREX) 2.5 MG tablet Take by mouth. 4 tablets twice a week  . metoprolol tartrate (LOPRESSOR) 25 MG tablet Take 1 tablet (25 mg total) by mouth 2 (two) times daily.  . nitroGLYCERIN (NITROSTAT) 0.4 MG SL tablet PLACE 1 TABLET (0.4 MG TOTAL) UNDER THE TONGUE EVERY 5 (FIVE) MINUTES AS NEEDED FOR CHEST PAIN.  Marland Kitchen ondansetron (ZOFRAN) 4 MG tablet TAKE 1 TABLET (4 MG TOTAL) BY MOUTH EVERY 6 (SIX) HOURS.  Marland Kitchen pantoprazole (PROTONIX) 40 MG tablet Take 40 mg by mouth daily.  . prednisoLONE acetate (PRED FORTE) 1 % ophthalmic suspension Place 4-5 drops into the left eye daily.  . rosuvastatin (CRESTOR) 40 MG tablet TAKE 1 TABLET BY MOUTH EVERY DAY  . sertraline (ZOLOFT) 100 MG tablet 1 1/2  tabs by mouth daily  . traMADol (ULTRAM) 50 MG tablet TAKE 1 TABLET EVERY 6 HOURS AS NEEDED FOR MODERATE PAIN (Patient taking differently: Take 50 mg by mouth every 6 (six) hours as needed for moderate pain. )   Allergies  Allergen Reactions  . Aspirin Other (See Comments)    Stomach bleeds Stomach bleeds GI bleeding Other reaction(s): Other (See Comments) GI bleeding Stomach bleeds      Review of Systems    Objective:   BP 122/78 (BP Location: Left Arm, Patient Position: Sitting, Cuff Size: Normal)   Pulse 80   Resp 12   Ht '5\' 1"'  (1.549 m)   Wt 142 lb (64.4 kg)   LMP 01/12/2015   BMI 26.83 kg/m   Physical Exam  Tearful throughout history when discussing stressors.   Lungs:  CTA CV:  RRR without murmur or rub.  Radial and DP pulses normal and equal Abd:  S, + BS, No HSM or mass.  Mild diffuse tenderness over length of left rectus muscle, less so over right.  No rebound or peritoneal signs.  No suprapubic tenderness. LE:  No  edema  Assessment & Plan   1.  Nausea and vomiting with weight loss:  Lots of stressors currently.   Main concern is she has no schedule to checking sugars, giving insulin and eating.  Her sugars appear to be bottoming out regularly and most likely cause of nausea and vomiting. Discussed a schedule and to use Boost Diabetic for a meal when she feels she cannot eat. No giving Novolog if she is not eating a meal of some sort in the next 15 minutes.  Continue SS with  Novolog. Lantus to be dosed twice daily, the first before breakfast with Novolog and the second before dinner with Novolog. To call progress in 2 days.   Refuses continuous monitor for now. Check UA, CBC, CMP:  UA returned abnormal after patient left--suspect contamination, but sending for urine culture. Follow weight loss closely.  2.  DM:  As above.    3.  Depression/stress, reactive:  Encouraged getting in touch with [facility] for [facility] as our LCSW-A is leaving end of this week. Denies suicidal ideation.  4.  HM:  Will get her back ASAP for CPE.  Spent 1 hour with patient face to face today.

## 2019-07-29 NOTE — Patient Instructions (Signed)
Daily schedule for sugar checks, insulin dosing and eating:  Check sugar at 8:45 a.m. Give same dose of morning Lantus Give Sliding scale dose of Novolog Eat breakfast by 9 a.m.:  Boost Diabetic for now--egg and fruit when tolerated.  Snack of peanut butter crackers or similar at 10:30 a.m.  Check sugar at 12:00 noon Give sliding scale dose of Novolog Eat lunch by 12:15  Snack of peanut butter crackers or similar at 3 p.m.  Check sugar at 5:45 p.m. Give usual dose of evening Lantus Give sliding scale dose of Novolog Eat dinner by 6 p.m.  Call sugars and progress to clinic this Thursday afternoon (after 2 p.m.)  Call for counseling with all of your stressors:   [facility]  Directions  Website Address: [address] [place], [place] [address]  Phone: [phone]

## 2019-07-30 ENCOUNTER — Other Ambulatory Visit: Payer: Self-pay

## 2019-07-30 LAB — CBC WITH DIFFERENTIAL/PLATELET
Basophils Absolute: 0 10*3/uL (ref 0.0–0.2)
Basos: 0 %
EOS (ABSOLUTE): 0 10*3/uL (ref 0.0–0.4)
Eos: 0 %
Hematocrit: 34.6 % (ref 34.0–46.6)
Hemoglobin: 11.2 g/dL (ref 11.1–15.9)
Immature Grans (Abs): 0.3 10*3/uL — ABNORMAL HIGH (ref 0.0–0.1)
Immature Granulocytes: 2 %
Lymphocytes Absolute: 2 10*3/uL (ref 0.7–3.1)
Lymphs: 14 %
MCH: 30.4 pg (ref 26.6–33.0)
MCHC: 32.4 g/dL (ref 31.5–35.7)
MCV: 94 fL (ref 79–97)
Monocytes Absolute: 0.9 10*3/uL (ref 0.1–0.9)
Monocytes: 6 %
Neutrophils Absolute: 10.9 10*3/uL — ABNORMAL HIGH (ref 1.4–7.0)
Neutrophils: 78 %
Platelets: 230 10*3/uL (ref 150–450)
RBC: 3.69 x10E6/uL — ABNORMAL LOW (ref 3.77–5.28)
RDW: 16.1 % — ABNORMAL HIGH (ref 11.7–15.4)
WBC: 14.1 10*3/uL — ABNORMAL HIGH (ref 3.4–10.8)

## 2019-07-30 LAB — COMPREHENSIVE METABOLIC PANEL
ALT: 7 IU/L (ref 0–32)
AST: 8 IU/L (ref 0–40)
Albumin/Globulin Ratio: 1.6 (ref 1.2–2.2)
Albumin: 4.4 g/dL (ref 3.8–4.9)
Alkaline Phosphatase: 121 IU/L — ABNORMAL HIGH (ref 39–117)
BUN/Creatinine Ratio: 14 (ref 9–23)
BUN: 20 mg/dL (ref 6–24)
Bilirubin Total: 1.3 mg/dL — ABNORMAL HIGH (ref 0.0–1.2)
CO2: 20 mmol/L (ref 20–29)
Calcium: 9.5 mg/dL (ref 8.7–10.2)
Chloride: 96 mmol/L (ref 96–106)
Creatinine, Ser: 1.47 mg/dL — ABNORMAL HIGH (ref 0.57–1.00)
GFR calc Af Amer: 47 mL/min/{1.73_m2} — ABNORMAL LOW (ref >59)
GFR calc non Af Amer: 41 mL/min/{1.73_m2} — ABNORMAL LOW (ref >59)
Globulin, Total: 2.8 g/dL (ref 1.5–4.5)
Glucose: 387 mg/dL — ABNORMAL HIGH (ref 65–99)
Potassium: 4.1 mmol/L (ref 3.5–5.2)
Sodium: 135 mmol/L (ref 134–144)
Total Protein: 7.2 g/dL (ref 6.0–8.5)

## 2019-07-30 MED ORDER — CIPROFLOXACIN HCL 500 MG PO TABS: 500.0000 mg | ORAL_TABLET | Freq: Two times a day (BID) | ORAL | 0 refills | Status: DC

## 2019-07-31 ENCOUNTER — Other Ambulatory Visit: Payer: Self-pay

## 2019-07-31 ENCOUNTER — Other Ambulatory Visit: Payer: Self-pay | Admitting: Internal Medicine

## 2019-07-31 LAB — URINE CULTURE

## 2019-07-31 MED ORDER — CIPROFLOXACIN HCL 500 MG PO TABS: 500.0000 mg | ORAL_TABLET | Freq: Two times a day (BID) | ORAL | 0 refills | Status: AC

## 2019-08-11 ENCOUNTER — Telehealth: Payer: Self-pay | Admitting: Internal Medicine

## 2019-08-11 NOTE — Telephone Encounter (Signed)
Patient called stating has been vomiting and feel nauseated since Sunday. Patient also stated nose is bleeding as well and that started a while ago. Per Dr. Delrae Alfred patient needs to be seen.

## 2019-08-12 ENCOUNTER — Ambulatory Visit (INDEPENDENT_AMBULATORY_CARE_PROVIDER_SITE_OTHER): Payer: Medicaid Other | Admitting: Internal Medicine

## 2019-08-12 ENCOUNTER — Other Ambulatory Visit: Payer: Self-pay

## 2019-08-12 ENCOUNTER — Other Ambulatory Visit: Payer: Self-pay | Admitting: Internal Medicine

## 2019-08-12 ENCOUNTER — Other Ambulatory Visit: Payer: Self-pay | Admitting: Interventional Cardiology

## 2019-08-12 ENCOUNTER — Encounter: Payer: Self-pay | Admitting: Internal Medicine

## 2019-08-12 VITALS — BP 118/62 | HR 76 | Resp 12 | Ht 61.0 in | Wt 139.0 lb

## 2019-08-12 DIAGNOSIS — R112 Nausea with vomiting, unspecified: Secondary | ICD-10-CM

## 2019-08-12 DIAGNOSIS — R109 Unspecified abdominal pain: Secondary | ICD-10-CM

## 2019-08-12 LAB — POCT URINALYSIS DIPSTICK
Bilirubin, UA: NEGATIVE
Glucose, UA: NEGATIVE
Ketones, UA: NEGATIVE
Nitrite, UA: NEGATIVE
Protein, UA: POSITIVE — AB
Spec Grav, UA: 1.02 (ref 1.010–1.025)
Urobilinogen, UA: 0.2 E.U./dL
pH, UA: 6.5 (ref 5.0–8.0)

## 2019-08-12 MED ORDER — CIPROFLOXACIN HCL 500 MG PO TABS: ORAL_TABLET | ORAL | 0 refills | Status: DC

## 2019-08-12 MED ORDER — METRONIDAZOLE 500 MG PO TABS: ORAL_TABLET | ORAL | 0 refills | Status: DC

## 2019-08-12 NOTE — Progress Notes (Signed)
  Subjective:    Patient ID: [name] [name], female   DOB: 11/15/1965, 54 y.o.   MRN: [id number]   HPI   Here as return of symptoms. Patient seen here on 07/28/2020 with nausea and vomiting.  Seemed to be associated with low blood sugars and poor scheduling of meds and eating. WBC was slightly elevated and complained of left sided abdominal pain as well. UA with [place] Treated with Cipro for presumed UTI as well, but urine culture was negative. Patient after start of Cipro stated she felt better--states after taking med for 24 hours. Was eating better.  Sugars were running 70 to 200.  She was eating regular meals as well as Boost.  No diarrhea, nausea or vomiting. She finished the Cipro 2 days ago and started with nausea and vomiting again yesterday. No history of diverticulosis from colonoscopy reports in past.  Yesterday morning, awakened with nausea and fatigue.  She has vomited twice since yesterday.  She vomited partially digested food.  States she had eaten eggs despite feeling nauseated earlier.  No hematemesis.  She did have a bloody nose after vomiting forcefully.   Has only had Boost today.  She has not vomited that.  She has been drinking water.  She did require a Zofran tab this morning. No fever.   She is having the abdominal pain across her abdomen just above waistline.  Has had daily formed brown stools .  Some relief if passes BM.   Having a lot of belching and acid reflux.   She took alka seltzer and Pepcid yesterday for this. Is regularly taking Pantoprazole 40 mg in the morning well before any other oral intake, but with all her other meds. No other NSAIDS. No one else at home currently. She is really struggling as her friend, Terrance is in the hospital with mental health issues.   He just came home yesterday. She feels more at ease now that he is home. No respiratory symptoms. No dysuria. Maybe urinary frequency, but she is drinking a lot of fluids. No vaginal  discharge and not sexually active in over 1 year.    Current Meds  Medication Sig  . Accu-Chek FastClix Lancets MISC CHECK BLOOD SUGAR 5 TIMES DAILY  . ACCU-CHEK GUIDE test strip USE AS DIRECTED CHECK BLOOD SUGAR 5 TIMES A DAY  . acetaminophen (TYLENOL) 500 MG tablet Take 500-1,000 mg by mouth every 6 (six) hours as needed for moderate pain.   . albuterol (PROVENTIL HFA;VENTOLIN HFA) 108 (90 Base) MCG/ACT inhaler Inhale 2 puffs into the lungs every 6 (six) hours as needed for wheezing or shortness of breath.  . Blood Glucose Monitoring Suppl (ACCU-CHEK GUIDE ME) w/Device KIT USE TO MONITOR BLOOD SUGAR FIVE TIMES A DAY DX  E10.22 IN VITRO  . clopidogrel (PLAVIX) 75 MG tablet TAKE 1 TABLET BY MOUTH EVERY DAY  . cyclobenzaprine (FLEXERIL) 10 MG tablet Take 1-2 tablets by mouth at bedtime as needed. Muscle spasms  . diphenhydrAMINE (BENADRYL) 25 MG tablet Take 25 mg by mouth every 6 (six) hours as needed.  . folic acid (FOLVITE) 1 MG tablet Take 1 mg by mouth daily.  . furosemide (LASIX) 40 MG tablet TAKE 1 TABLET BY MOUTH EVERY DAY IN THE MORNING  . Incontinence Supply Disposable (POISE ULTRA THINS) PADS Use 5 pads daily as needed for urinary stress incontinence  . insulin aspart (NOVOLOG) 100 UNIT/ML injection Inject 4-16 Units into the skin 3 (three) times daily with meals. Sliding scale CBG 100-150;   4 units, 151-200; 6 units, 201-250; 8 units, 251-300; 10 units, 301-350; 12 units, 351-400; 14 units, > 401; call doctor  . LANTUS SOLOSTAR 100 UNIT/ML Solostar Pen Inject 10 Units into the skin 2 (two) times daily.   . methotrexate (RHEUMATREX) 2.5 MG tablet Take 10 mg by mouth once a week.   . metoprolol tartrate (LOPRESSOR) 25 MG tablet TAKE 1 TABLET BY MOUTH TWICE A DAY  . nitroGLYCERIN (NITROSTAT) 0.4 MG SL tablet PLACE 1 TABLET (0.4 MG TOTAL) UNDER THE TONGUE EVERY 5 (FIVE) MINUTES AS NEEDED FOR CHEST PAIN.  Marland Kitchen ondansetron (ZOFRAN) 4 MG tablet TAKE 1 TABLET (4 MG TOTAL) BY MOUTH EVERY 6 (SIX)  HOURS.  Marland Kitchen pantoprazole (PROTONIX) 40 MG tablet Take 40 mg by mouth daily.  . prednisoLONE acetate (PRED FORTE) 1 % ophthalmic suspension Place 4-5 drops into the left eye daily.  . rosuvastatin (CRESTOR) 40 MG tablet TAKE 1 TABLET BY MOUTH EVERY DAY  . sertraline (ZOLOFT) 100 MG tablet 1 1/2  tabs by mouth daily  . traMADol (ULTRAM) 50 MG tablet TAKE 1 TABLET EVERY 6 HOURS AS NEEDED FOR MODERATE PAIN (Patient taking differently: Take 50 mg by mouth every 6 (six) hours as needed for moderate pain. )   Allergies  Allergen Reactions  . Aspirin Other (See Comments)    Stomach bleeds Stomach bleeds GI bleeding Other reaction(s): Other (See Comments) GI bleeding Stomach bleeds      Review of Systems    Objective:   BP 118/62 (BP Location: Left Arm, Patient Position: Sitting, Cuff Size: Normal)   Pulse 76   Resp 12   Ht 5' 1" (1.549 m)   Wt 139 lb (63 kg)   LMP 01/12/2015   BMI 26.26 kg/m   Physical Exam  HEENT:  Right pupil reactive to light with EOMI.  TMs pearly gray.  Mouth:  MM tacky Neck:  Supple, No adenopathy Chest:  CTA CV:  RRR without murmur or rub.  Radial pulses normal and equal. Abd:  S, + BS, tender without rebound or peritoneal signs mainly in Left mid abdomen.  Extensive BS, but not Hiegel pitched.  No HSM or mass. LE:  No edema Assessment & Plan   1.  Recurrent abdominal pain, nausea and vomiting.  No diarrhea No clear of cause. Rechecking UA/culture if abnormal CT of abdomen/pelvis Treat for possible diverticulitis for now with Cipro and Metronidazole as seemed to significantly improve on short course of Cipro previously.

## 2019-08-12 NOTE — Patient Instructions (Signed)
Bland diet, water, other clear liquids.

## 2019-08-13 LAB — CBC WITH DIFFERENTIAL/PLATELET
Basophils Absolute: 0 10*3/uL (ref 0.0–0.2)
Basos: 0 %
EOS (ABSOLUTE): 0.1 10*3/uL (ref 0.0–0.4)
Eos: 2 %
Hematocrit: 36.6 % (ref 34.0–46.6)
Hemoglobin: 12 g/dL (ref 11.1–15.9)
Immature Grans (Abs): 0 10*3/uL (ref 0.0–0.1)
Immature Granulocytes: 0 %
Lymphocytes Absolute: 3.2 10*3/uL — ABNORMAL HIGH (ref 0.7–3.1)
Lymphs: 44 %
MCH: 30.3 pg (ref 26.6–33.0)
MCHC: 32.8 g/dL (ref 31.5–35.7)
MCV: 92 fL (ref 79–97)
Monocytes Absolute: 0.1 10*3/uL (ref 0.1–0.9)
Monocytes: 2 %
Neutrophils Absolute: 3.9 10*3/uL (ref 1.4–7.0)
Neutrophils: 52 %
Platelets: 342 10*3/uL (ref 150–450)
RBC: 3.96 x10E6/uL (ref 3.77–5.28)
RDW: 16.9 % — ABNORMAL HIGH (ref 11.7–15.4)
WBC: 7.4 10*3/uL (ref 3.4–10.8)

## 2019-08-13 LAB — COMPREHENSIVE METABOLIC PANEL
ALT: 16 IU/L (ref 0–32)
AST: 29 IU/L (ref 0–40)
Albumin/Globulin Ratio: 1.7 (ref 1.2–2.2)
Albumin: 4.8 g/dL (ref 3.8–4.9)
Alkaline Phosphatase: 141 IU/L — ABNORMAL HIGH (ref 39–117)
BUN/Creatinine Ratio: 13 (ref 9–23)
BUN: 20 mg/dL (ref 6–24)
Bilirubin Total: 0.4 mg/dL (ref 0.0–1.2)
CO2: 24 mmol/L (ref 20–29)
Calcium: 10.1 mg/dL (ref 8.7–10.2)
Chloride: 104 mmol/L (ref 96–106)
Creatinine, Ser: 1.5 mg/dL — ABNORMAL HIGH (ref 0.57–1.00)
GFR calc Af Amer: 46 mL/min/{1.73_m2} — ABNORMAL LOW (ref >59)
GFR calc non Af Amer: 40 mL/min/{1.73_m2} — ABNORMAL LOW (ref >59)
Globulin, Total: 2.9 g/dL (ref 1.5–4.5)
Glucose: 80 mg/dL (ref 65–99)
Potassium: 3 mmol/L — ABNORMAL LOW (ref 3.5–5.2)
Sodium: 145 mmol/L — ABNORMAL HIGH (ref 134–144)
Total Protein: 7.7 g/dL (ref 6.0–8.5)

## 2019-08-14 LAB — URINE CULTURE

## 2019-08-15 ENCOUNTER — Encounter: Payer: Self-pay | Admitting: Internal Medicine

## 2019-08-15 ENCOUNTER — Ambulatory Visit (INDEPENDENT_AMBULATORY_CARE_PROVIDER_SITE_OTHER): Payer: Medicaid Other | Admitting: Internal Medicine

## 2019-08-15 ENCOUNTER — Other Ambulatory Visit: Payer: Self-pay

## 2019-08-15 VITALS — BP 118/56 | HR 88 | Resp 12 | Ht 61.0 in | Wt 138.0 lb

## 2019-08-15 DIAGNOSIS — R112 Nausea with vomiting, unspecified: Secondary | ICD-10-CM

## 2019-08-15 DIAGNOSIS — R109 Unspecified abdominal pain: Secondary | ICD-10-CM | POA: Diagnosis not present

## 2019-08-15 DIAGNOSIS — E118 Type 2 diabetes mellitus with unspecified complications: Secondary | ICD-10-CM

## 2019-08-15 DIAGNOSIS — B370 Candidal stomatitis: Secondary | ICD-10-CM | POA: Diagnosis not present

## 2019-08-15 MED ORDER — CLOTRIMAZOLE 10 MG MT TROC: 10.0000 mg | Freq: Every day | OROMUCOSAL | 0 refills | Status: DC

## 2019-08-15 NOTE — Progress Notes (Signed)
Subjective:    Patient ID: [name] [name], female   DOB: 07-13-1966, 54 y.o.   MRN: [id number]   HPI   3 Day follow up after starting Cipro and Metronidazole for possible GI infectious process/diverticulitis, though no history of diverticulosis.  Nausea, vomiting and abdominal pain:  No nausea and vomiting.  Abdominal pain is much better.  Stools are regular and formed.  No hematochezia or melena.  No fever.  Appetite much better.   She is on day 2.5 of Metronidazole and CIpro.   She did not get CT scan of abdomen as ordered 3 days ago.  Call into [place].  WBC now in the normal range.  Kidney and liver function stable.  UA unremarkable  DM:  Sugar 127 this afternoon.  Ranging from 72 to 220.    Mouth discomfort:  Started about 5 days ago and worsening. Is on a second go around of antibiotics.  Whitish discoloration of buccal mucosa.  Mild Hypokalemia: noted on labs from the 12th.  Current Meds  Medication Sig  . Accu-Chek FastClix Lancets MISC CHECK BLOOD SUGAR 5 TIMES DAILY  . ACCU-CHEK GUIDE test strip USE AS DIRECTED CHECK BLOOD SUGAR 5 TIMES A DAY  . acetaminophen (TYLENOL) 500 MG tablet Take 500-1,000 mg by mouth every 6 (six) hours as needed for moderate pain.   Marland Kitchen albuterol (PROVENTIL HFA;VENTOLIN HFA) 108 (90 Base) MCG/ACT inhaler Inhale 2 puffs into the lungs every 6 (six) hours as needed for wheezing or shortness of breath.  . Blood Glucose Monitoring Suppl (ACCU-CHEK GUIDE ME) w/Device KIT USE TO MONITOR BLOOD SUGAR FIVE TIMES A DAY DX  E10.22 IN VITRO  . ciprofloxacin (CIPRO) 500 MG tablet 1 tab by mouth twice daily for 10 days  . clopidogrel (PLAVIX) 75 MG tablet TAKE 1 TABLET BY MOUTH EVERY DAY  . cyclobenzaprine (FLEXERIL) 10 MG tablet Take 1-2 tablets by mouth at bedtime as needed. Muscle spasms  . diphenhydrAMINE (BENADRYL) 25 MG tablet Take 25 mg by mouth every 6 (six) hours as needed.  . folic acid (FOLVITE) 1 MG tablet Take 1 mg by mouth daily.  .  furosemide (LASIX) 40 MG tablet TAKE 1 TABLET BY MOUTH EVERY DAY IN THE MORNING  . Incontinence Supply Disposable (POISE ULTRA THINS) PADS Use 5 pads daily as needed for urinary stress incontinence  . insulin aspart (NOVOLOG) 100 UNIT/ML injection Inject 4-16 Units into the skin 3 (three) times daily with meals. Sliding scale CBG 100-150; 4 units, 151-200; 6 units, 201-250; 8 units, 251-300; 10 units, 301-350; 12 units, 351-400; 14 units, > 401; call doctor  . LANTUS SOLOSTAR 100 UNIT/ML Solostar Pen Inject 10 Units into the skin 2 (two) times daily.   . methotrexate (RHEUMATREX) 2.5 MG tablet Take 10 mg by mouth once a week.   . metoprolol tartrate (LOPRESSOR) 25 MG tablet TAKE 1 TABLET BY MOUTH TWICE A DAY  . metroNIDAZOLE (FLAGYL) 500 MG tablet 1 tab by mouth 3 times daily with Boost or meal  . nitroGLYCERIN (NITROSTAT) 0.4 MG SL tablet PLACE 1 TABLET (0.4 MG TOTAL) UNDER THE TONGUE EVERY 5 (FIVE) MINUTES AS NEEDED FOR CHEST PAIN.  Marland Kitchen ondansetron (ZOFRAN) 4 MG tablet TAKE 1 TABLET (4 MG TOTAL) BY MOUTH EVERY 6 (SIX) HOURS.  Marland Kitchen pantoprazole (PROTONIX) 40 MG tablet Take 40 mg by mouth daily.  . prednisoLONE acetate (PRED FORTE) 1 % ophthalmic suspension Place 4-5 drops into the left eye daily.  . rosuvastatin (CRESTOR) 40 MG tablet  TAKE 1 TABLET BY MOUTH EVERY DAY  . sertraline (ZOLOFT) 100 MG tablet 1 1/2  tabs by mouth daily  . traMADol (ULTRAM) 50 MG tablet TAKE 1 TABLET EVERY 6 HOURS AS NEEDED FOR MODERATE PAIN (Patient taking differently: Take 50 mg by mouth every 6 (six) hours as needed for moderate pain. )   Allergies  Allergen Reactions  . Aspirin Other (See Comments)    Stomach bleeds Stomach bleeds GI bleeding Other reaction(s): Other (See Comments) GI bleeding Stomach bleeds      Review of Systems    Objective:   BP (!) 118/56 (BP Location: Left Arm, Patient Position: Sitting, Cuff Size: Normal)   Pulse 88   Resp 12   Ht _0  (1.549 m)   Wt 138 lb (62.6 kg)   LMP  01/12/2015   BMI 26.07 kg/m   Physical Exam  NAD HEENT:  White adherent substance on buccal mucosa with mild underlying erythema. Lungs:  CTA CV:  RRR without murmur or rub.   Abd:  S, + BS, mild mid left abdominal tenderness, but no peritoneal or rebound signs.  No HSM or mass.   Assessment & Plan   1.  Left abdominal pain with nausea and vomiting.  Much improved.  Has not been scheduled for CT as radiology now states she needs a preauthorization.  Concerned for possible diverticulitis as seemed to improve previously on Cipro  2.  Oral thrush:  Mycelex troch 5 times daily for 10 days.  Call if does not improve.

## 2019-08-25 ENCOUNTER — Telehealth: Payer: Self-pay | Admitting: Internal Medicine

## 2019-08-29 ENCOUNTER — Other Ambulatory Visit (INDEPENDENT_AMBULATORY_CARE_PROVIDER_SITE_OTHER): Payer: Medicaid Other

## 2019-08-29 NOTE — Progress Notes (Signed)
Patient came in for a weight check. Patient has gained 4 lbs since last visit on 08/15/19.

## 2019-09-02 ENCOUNTER — Ambulatory Visit
Admission: RE | Admit: 2019-09-02 | Discharge: 2019-09-02 | Disposition: A | Discharge status: Home / Self Care | Payer: Medicaid Other | Source: Ambulatory Visit | Attending: Internal Medicine | Admitting: Internal Medicine

## 2019-09-02 DIAGNOSIS — R112 Nausea with vomiting, unspecified: Secondary | ICD-10-CM

## 2019-09-02 DIAGNOSIS — R109 Unspecified abdominal pain: Secondary | ICD-10-CM

## 2019-09-02 MED ORDER — IOPAMIDOL (ISOVUE-300) INJECTION 61%
100.0000 mL | Freq: Once | INTRAVENOUS | Status: AC | PRN
  Administered 2019-09-02: 100 mL via INTRAVENOUS

## 2019-09-03 ENCOUNTER — Other Ambulatory Visit: Payer: Self-pay | Admitting: Internal Medicine

## 2019-09-03 ENCOUNTER — Ambulatory Visit (INDEPENDENT_AMBULATORY_CARE_PROVIDER_SITE_OTHER): Payer: Medicaid Other | Admitting: Internal Medicine

## 2019-09-03 ENCOUNTER — Encounter: Payer: Self-pay | Admitting: Internal Medicine

## 2019-09-03 ENCOUNTER — Other Ambulatory Visit: Payer: Self-pay

## 2019-09-03 VITALS — BP 136/70 | HR 84 | Resp 12 | Ht 61.0 in | Wt 141.0 lb

## 2019-09-03 DIAGNOSIS — Z124 Encounter for screening for malignant neoplasm of cervix: Secondary | ICD-10-CM | POA: Diagnosis not present

## 2019-09-03 DIAGNOSIS — E103599 Type 1 diabetes mellitus with proliferative diabetic retinopathy without macular edema, unspecified eye: Secondary | ICD-10-CM | POA: Insufficient documentation

## 2019-09-03 DIAGNOSIS — M059 Rheumatoid arthritis with rheumatoid factor, unspecified: Secondary | ICD-10-CM

## 2019-09-03 DIAGNOSIS — R109 Unspecified abdominal pain: Secondary | ICD-10-CM

## 2019-09-03 DIAGNOSIS — F4329 Adjustment disorder with other symptoms: Secondary | ICD-10-CM

## 2019-09-03 DIAGNOSIS — N898 Other specified noninflammatory disorders of vagina: Secondary | ICD-10-CM

## 2019-09-03 DIAGNOSIS — B372 Candidiasis of skin and nail: Secondary | ICD-10-CM

## 2019-09-03 DIAGNOSIS — Z780 Asymptomatic menopausal state: Secondary | ICD-10-CM

## 2019-09-03 DIAGNOSIS — F329 Major depressive disorder, single episode, unspecified: Secondary | ICD-10-CM

## 2019-09-03 DIAGNOSIS — E118 Type 2 diabetes mellitus with unspecified complications: Secondary | ICD-10-CM

## 2019-09-03 DIAGNOSIS — Z0000 Encounter for general adult medical examination without abnormal findings: Secondary | ICD-10-CM

## 2019-09-03 DIAGNOSIS — N289 Disorder of kidney and ureter, unspecified: Secondary | ICD-10-CM

## 2019-09-03 DIAGNOSIS — B370 Candidal stomatitis: Secondary | ICD-10-CM

## 2019-09-03 DIAGNOSIS — N200 Calculus of kidney: Secondary | ICD-10-CM

## 2019-09-03 LAB — POCT WET PREP WITH KOH
KOH Prep POC: NEGATIVE
RBC Wet Prep HPF POC: NEGATIVE
Trichomonas, UA: NEGATIVE

## 2019-09-03 MED ORDER — FLUCONAZOLE 150 MG PO TABS: ORAL_TABLET | ORAL | 0 refills | Status: DC

## 2019-09-03 MED ORDER — CLOPIDOGREL BISULFATE 75 MG PO TABS: 75.0000 mg | ORAL_TABLET | Freq: Every day | ORAL | 3 refills | Status: DC
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
## 2019-09-03 NOTE — Patient Instructions (Addendum)
Drink a glass of water before every meal Drink 6-8 glasses of water daily Eat three meals daily Eat a protein and healthy fat with every meal (eggs,fish, chicken, [place] and limit red meats) Eat 5 servings of vegetables daily, mix the colors Eat 2 servings of fruit daily with skin, if skin is edible Use smaller plates Put food/utensils down as you chew and swallow each bite Eat at a table with friends/family at least once daily, no TV Do not eat in front of the TV  Recent studies show that people who consume all of their calories in a 12 hour period lose weight more efficiently.  For example, if you eat your first meal at 7:00 a.m., your last meal of the day should be completed by 7:00 p.m.  Can google "advance directives, [place]"  And bring up form from Secretary of [place]. Print and fill out Or can go to "5 wishes"  Which is also in Spanish and fill out--this costs $5--perhaps easier to use. Designate a Cytogeneticist to speak for you if you are unable to speak for yourself when ill or injured   Tea Tree oil mixed with Gold Bond Foot Cream daily after bath--apply to feet, but not in between toes--really work it into the dry flaky areas especially

## 2019-09-04 ENCOUNTER — Encounter: Payer: Self-pay | Admitting: Internal Medicine

## 2019-09-04 ENCOUNTER — Ambulatory Visit: Payer: Medicaid Other | Admitting: Podiatry

## 2019-09-04 ENCOUNTER — Other Ambulatory Visit (INDEPENDENT_AMBULATORY_CARE_PROVIDER_SITE_OTHER): Payer: Medicaid Other

## 2019-09-04 ENCOUNTER — Other Ambulatory Visit: Payer: Self-pay | Admitting: Internal Medicine

## 2019-09-04 DIAGNOSIS — Z1231 Encounter for screening mammogram for malignant neoplasm of breast: Secondary | ICD-10-CM

## 2019-09-04 DIAGNOSIS — N200 Calculus of kidney: Secondary | ICD-10-CM | POA: Insufficient documentation

## 2019-09-04 DIAGNOSIS — N289 Disorder of kidney and ureter, unspecified: Secondary | ICD-10-CM | POA: Insufficient documentation

## 2019-09-04 DIAGNOSIS — J029 Acute pharyngitis, unspecified: Secondary | ICD-10-CM | POA: Diagnosis not present

## 2019-09-05 LAB — NOVEL CORONAVIRUS, NAA: SARS-CoV-2, NAA: NOT DETECTED

## 2019-09-05 LAB — CYTOLOGY - PAP

## 2019-09-05 LAB — GC/CHLAMYDIA PROBE AMP
Chlamydia trachomatis, NAA: NEGATIVE
Neisseria Gonorrhoeae by PCR: NEGATIVE

## 2019-09-08 ENCOUNTER — Encounter: Payer: Self-pay | Admitting: Podiatry

## 2019-09-08 ENCOUNTER — Other Ambulatory Visit: Payer: Self-pay | Admitting: Internal Medicine

## 2019-09-08 ENCOUNTER — Ambulatory Visit: Payer: Medicaid Other | Admitting: Podiatry

## 2019-09-08 ENCOUNTER — Other Ambulatory Visit: Payer: Self-pay

## 2019-09-08 DIAGNOSIS — L02612 Cutaneous abscess of left foot: Secondary | ICD-10-CM | POA: Diagnosis not present

## 2019-09-08 DIAGNOSIS — E1149 Type 2 diabetes mellitus with other diabetic neurological complication: Secondary | ICD-10-CM | POA: Diagnosis not present

## 2019-09-08 DIAGNOSIS — M79674 Pain in right toe(s): Secondary | ICD-10-CM

## 2019-09-08 DIAGNOSIS — M79675 Pain in left toe(s): Secondary | ICD-10-CM

## 2019-09-08 DIAGNOSIS — B351 Tinea unguium: Secondary | ICD-10-CM | POA: Diagnosis not present

## 2019-09-08 MED ORDER — GABAPENTIN 100 MG PO CAPS: 100.0000 mg | ORAL_CAPSULE | Freq: Every day | ORAL | 0 refills | Status: DC

## 2019-09-08 MED ORDER — DOXYCYCLINE HYCLATE 100 MG PO TABS: 100.0000 mg | ORAL_TABLET | Freq: Two times a day (BID) | ORAL | 0 refills | Status: DC

## 2019-09-08 NOTE — Patient Instructions (Signed)

## 2019-09-09 LAB — BASIC METABOLIC PANEL
BUN/Creatinine Ratio: 17 (ref 9–23)
BUN: 19 mg/dL (ref 6–24)
CO2: 20 mmol/L (ref 20–29)
Calcium: 10.8 mg/dL — ABNORMAL HIGH (ref 8.7–10.2)
Chloride: 101 mmol/L (ref 96–106)
Creatinine, Ser: 1.14 mg/dL — ABNORMAL HIGH (ref 0.57–1.00)
GFR calc Af Amer: 64 mL/min/{1.73_m2} (ref >59)
GFR calc non Af Amer: 55 mL/min/{1.73_m2} — ABNORMAL LOW (ref >59)
Glucose: 156 mg/dL — ABNORMAL HIGH (ref 65–99)
Potassium: 3.6 mmol/L (ref 3.5–5.2)
Sodium: 139 mmol/L (ref 134–144)

## 2019-09-09 LAB — LIPID PANEL W/O CHOL/HDL RATIO
Cholesterol, Total: 149 mg/dL (ref 100–199)
HDL: 70 mg/dL (ref >39)
LDL Chol Calc (NIH): 66 mg/dL (ref 0–99)
Triglycerides: 66 mg/dL (ref 0–149)
VLDL Cholesterol Cal: 13 mg/dL (ref 5–40)

## 2019-09-09 LAB — MICROALBUMIN / CREATININE URINE RATIO
Creatinine, Urine: 80.4 mg/dL
Microalb/Creat Ratio: 257 mg/g creat — ABNORMAL HIGH (ref 0–29)
Microalbumin, Urine: 206.6 ug/mL

## 2019-09-09 LAB — HGB A1C W/O EAG: Hgb A1c MFr Bld: 6.2 % — ABNORMAL HIGH (ref 4.8–5.6)

## 2019-09-09 NOTE — Progress Notes (Signed)
Subjective: 54 y.o. returns the office today for painful, elongated, thickened toenails which she cannot trim herself and for calluses which are painful.  She has noticed the callus on the left foot has become more painful recently.  Denies any redness or drainage around the nails/calluses. Denies any acute changes since last appointment and no new complaints today. Denies any systemic complaints such as fevers, chills, nausea, vomiting.   PCP: Julieanne Manson, MD- last seen 09/03/2019 A1c: 7 (04/15/2019)  Objective: AAO 3, NAD DP/PT pulses palpable, CRT less than 3 seconds Sensation decreased with Semmes Weinstein monofilament. Nails hypertrophic, dystrophic, elongated, brittle, discolored 10. There is tenderness overlying the nails 1-5 bilaterally. There is no surrounding erythema or drainage along the nail sites. Hyperkeratotic lesion left foot submetatarsal 5 and upon debridement there was drainage identified and appear to be thick, old purulence present.  Upon debridement it appeared that there was a blister that was adjacent to the callus and upon debridement there is new, healthy pink skin present but no definitive open lesion.  There is no edema, erythema. No other areas of tenderness bilateral lower extremities. No overlying edema, erythema, increased warmth. No pain with calf compression, swelling, warmth, erythema.  Assessment: Patient presents with symptomatic onychomycosis, hyperkeratotic lesion with superficial abscess  Plan: -Treatment options including alternatives, risks, complications were discussed -Nails sharply debrided 10 without complication/bleeding. -Hyperkeratotic lesion sharply debrided to reveal small amount of drainage.  This was cultured today.  Prescribed doxycycline.  Recommended a small amount of antibiotic ointment dressing changes daily.  Offloading. -Discussed daily foot inspection. If there are any changes, to call the office immediately.  -Follow-up as  scheduled for wound check left foot or sooner if any problems are to arise. In the meantime, encouraged to call the office with any questions, concerns, changes symptoms.  Ovid Curd, DPM

## 2019-09-11 LAB — WOUND CULTURE
MICRO NUMBER:: 10127946
RESULT:: NO GROWTH
SPECIMEN QUALITY:: ADEQUATE

## 2019-09-15 ENCOUNTER — Other Ambulatory Visit: Payer: Self-pay

## 2019-09-15 ENCOUNTER — Encounter: Payer: Self-pay | Admitting: Podiatry

## 2019-09-15 ENCOUNTER — Ambulatory Visit: Payer: Medicaid Other | Admitting: Podiatry

## 2019-09-15 DIAGNOSIS — L853 Xerosis cutis: Secondary | ICD-10-CM | POA: Diagnosis not present

## 2019-09-15 DIAGNOSIS — L02612 Cutaneous abscess of left foot: Secondary | ICD-10-CM

## 2019-09-15 MED ORDER — AMMONIUM LACTATE 12 % EX CREA: TOPICAL_CREAM | CUTANEOUS | 0 refills | Status: DC | PRN

## 2019-09-15 NOTE — Progress Notes (Signed)
Subjective: [name] [name] presents the office today for follow-up evaluation of a superficial abscess left foot.  She is on doxycycline and she will finish tomorrow she denied any side effects of medication.  She has no pain to the foot denies any swelling or redness or any new issues. Denies any systemic complaints such as fevers, chills, nausea, vomiting. No acute changes since last appointment, and no other complaints at this time.   Objective: AAO x3, NAD DP/PT pulses palpable bilaterally, CRT less than 3 seconds Area of the left foot submetatarsal 5 is healed.  New, healthy skin is present.  There is no open lesions identified.  There is no edema, erythema or any signs of infection. Dry skin posterior aspect right heel with any skin fissures or open sores. No open lesions or pre-ulcerative lesions.  No pain with calf compression, swelling, warmth, erythema      Assessment: Healed wound, superficial abscess left foot; dry skin right heel  Plan: -All treatment options discussed with the patient including all alternatives, risks, complications.  -Finish course of antibiotics.  Continue offloading.  Moisturizer daily. -Prescribed AmLactin for the dry skin. -Patient encouraged to call the office with any questions, concerns, change in symptoms.   Return in about 9 weeks (around 11/17/2019) for nail trim .  Vivi Barrack DPM

## 2019-10-03 ENCOUNTER — Telehealth: Payer: Self-pay | Admitting: Internal Medicine

## 2019-10-09 ENCOUNTER — Telehealth: Payer: Medicaid Other | Admitting: Internal Medicine

## 2019-10-11 ENCOUNTER — Other Ambulatory Visit: Payer: Self-pay | Admitting: Podiatry

## 2019-10-16 ENCOUNTER — Telehealth (INDEPENDENT_AMBULATORY_CARE_PROVIDER_SITE_OTHER): Payer: Self-pay | Admitting: Clinical

## 2019-10-17 ENCOUNTER — Ambulatory Visit: Payer: Self-pay

## 2019-10-17 DIAGNOSIS — F4329 Adjustment disorder with other symptoms: Secondary | ICD-10-CM

## 2019-10-17 NOTE — Therapy (Deleted)
  THERAPY PROGRESS NOTE  Session Time: 60 minutes   Participation Level: Active  Behavioral Response: CasualAlertNA  Type of Therapy: Individual Therapy  Treatment Goals addressed: Anxiety and Coping  Interventions: CBT  Summary: [name] [name] is a 54 y.o. female who presents with anxiety after her friend has been admitted to the hospital.  Patient states she wants to talk about her weight loss. Patient states she went from 197 pounds to 133 pounds. Patient states she is not happy about her wight lost and her body. Patient states she is trying to gain weight. Patient states she is scare because her best friend is in the hospital due to drug reaction. Patient states she is afraid because she is recovering addict. Patient states she has been clean since March 08, 2007. Patient states she is trying to quit smoking. Patient states she reached out to quit smoking hotline and was matched with a life coach. Patient states she is following the plan and determine to quit by April 14th.    Suicidal/Homicidal: No{yes/no/with/without intent/plan:22693}  Therapist Response: Patient is dressed casually and neatly. Patient affect, mood, and eye contact are within normal limits. Patient speech was within normal limits. Patient is aware of time and place. Patient became emotional when talking about her weight lost.    Plan: Return again in 2 weeks.  Diagnosis: Axis I: Adjustment Disorder with Anxiety      H'yua Agueda Houpt, Student-Social Work 10/29/2019       .

## 2019-10-22 ENCOUNTER — Ambulatory Visit: Payer: Medicaid Other | Admitting: Internal Medicine

## 2019-10-22 ENCOUNTER — Other Ambulatory Visit: Payer: Self-pay

## 2019-10-22 ENCOUNTER — Encounter: Payer: Self-pay | Admitting: Internal Medicine

## 2019-10-22 VITALS — BP 120/70 | HR 78 | Resp 12 | Ht 61.0 in | Wt 136.0 lb

## 2019-10-22 DIAGNOSIS — L659 Nonscarring hair loss, unspecified: Secondary | ICD-10-CM | POA: Diagnosis not present

## 2019-10-22 DIAGNOSIS — L720 Epidermal cyst: Secondary | ICD-10-CM | POA: Diagnosis not present

## 2019-10-22 DIAGNOSIS — L089 Local infection of the skin and subcutaneous tissue, unspecified: Secondary | ICD-10-CM

## 2019-10-22 MED ORDER — DOXYCYCLINE HYCLATE 100 MG PO TABS: ORAL_TABLET | ORAL | 0 refills | Status: DC

## 2019-10-22 NOTE — Progress Notes (Signed)
Subjective:    Patient ID: [name] [name], female   DOB: 1966/01/30, 54 y.o.   MRN: [id number]   HPI   Redness and swelling of posterior right ear.  States the night before the swelling started, she had put in a stud earring to open up a piercing in her earlobe.  She is wondering if it was made of a metal that she may have had a reaction to.  This has happened in the past.    Concerned with her hair thinning and has lost weight again.  She does state her appetite has improved markedly as well as her intake.  States her sugars are running fine.   Does have the stress of her girlfriend being in the hospital after having another stroke. Is working on tobacco cessation with nicotine lozenges.  Her goal is to quit by April 14th.  Sounds like she is using the nicotine lozenge intermittently as she does not like the taste.    Current Meds  Medication Sig  . Accu-Chek FastClix Lancets MISC CHECK BLOOD SUGAR 5 TIMES DAILY  . ACCU-CHEK GUIDE test strip USE AS DIRECTED CHECK BLOOD SUGAR 5 TIMES A DAY  . acetaminophen (TYLENOL) 500 MG tablet Take 500-1,000 mg by mouth every 6 (six) hours as needed for moderate pain.   Marland Kitchen ammonium lactate (AMLACTIN) 12 % cream Apply topically as needed for dry skin.  . Blood Glucose Monitoring Suppl (ACCU-CHEK GUIDE ME) w/Device KIT USE TO MONITOR BLOOD SUGAR FIVE TIMES A DAY DX  E10.22 IN VITRO  . clopidogrel (PLAVIX) 75 MG tablet Take 1 tablet (75 mg total) by mouth daily.  . diphenhydrAMINE (BENADRYL) 25 MG tablet Take 25 mg by mouth every 6 (six) hours as needed.  . folic acid (FOLVITE) 1 MG tablet Take 1 mg by mouth daily.  . furosemide (LASIX) 40 MG tablet TAKE 1 TABLET BY MOUTH EVERY DAY IN THE MORNING  . gabapentin (NEURONTIN) 100 MG capsule TAKE 1 CAPSULE BY MOUTH AT BEDTIME.  . insulin aspart (NOVOLOG) 100 UNIT/ML injection Inject 4-16 Units into the skin 3 (three) times daily with meals. Sliding scale CBG 100-150; 4 units, 151-200; 6 units, 201-250; 8 units,  251-300; 10 units, 301-350; 12 units, 351-400; 14 units, > 401; call doctor  . LANTUS SOLOSTAR 100 UNIT/ML Solostar Pen Inject 10 Units into the skin 2 (two) times daily.   . methotrexate (RHEUMATREX) 2.5 MG tablet Take 10 mg by mouth once a week.   . metoprolol tartrate (LOPRESSOR) 25 MG tablet TAKE 1 TABLET BY MOUTH TWICE A DAY  . nitroGLYCERIN (NITROSTAT) 0.4 MG SL tablet PLACE 1 TABLET (0.4 MG TOTAL) UNDER THE TONGUE EVERY 5 (FIVE) MINUTES AS NEEDED FOR CHEST PAIN.  Marland Kitchen ondansetron (ZOFRAN) 4 MG tablet TAKE 1 TABLET (4 MG TOTAL) BY MOUTH EVERY 6 (SIX) HOURS.  Marland Kitchen pantoprazole (PROTONIX) 40 MG tablet Take 40 mg by mouth daily.  . prednisoLONE acetate (PRED FORTE) 1 % ophthalmic suspension Place 4-5 drops into the left eye daily.  . rosuvastatin (CRESTOR) 40 MG tablet TAKE 1 TABLET BY MOUTH EVERY DAY  . sertraline (ZOLOFT) 100 MG tablet 1 1/2  tabs by mouth daily  . traMADol (ULTRAM) 50 MG tablet TAKE 1 TABLET EVERY 6 HOURS AS NEEDED FOR MODERATE PAIN  . triamcinolone cream (KENALOG) 0.1 % APPLY ON THE SKIN TWICE A DAY X2 WEEKS, TAKE 2 WEEKS OFF THEN REPEAT   Allergies  Allergen Reactions  . Aspirin Other (See Comments)  Stomach bleeds Stomach bleeds GI bleeding Other reaction(s): Other (See Comments) GI bleeding Stomach bleeds      Review of Systems    Objective:   BP 120/70 (BP Location: Left Arm, Patient Position: Sitting, Cuff Size: Normal)   Pulse 78   Resp 12   Ht '5\' 1"'  (1.549 m)   Wt 136 lb (61.7 kg)   LMP 01/12/2015   BMI 25.70 kg/m   Physical Exam  NAD HEENT:  Behind right ear, epidermoid cyst less than 1 cm with erythema and swelling.  After consent, I & D of lesion with sterile procedure.  Scant pustular and cheesy discharge.   Hair is thinning    Assessment & Plan  1.  I and D of  Infected right retroauricular epidermoid cyst.  Doxycycline 100 mg twice daily for 3 days.  2.  Hair loss:  Suspect chronic stress part of this, but will check TSH.

## 2019-10-22 NOTE — Patient Instructions (Signed)
Call if ear does not continue to heal

## 2019-10-23 ENCOUNTER — Other Ambulatory Visit: Payer: Self-pay | Admitting: Internal Medicine

## 2019-10-24 LAB — TSH: TSH: 1.03 u[IU]/mL (ref 0.450–4.500)

## 2019-10-30 ENCOUNTER — Telehealth: Payer: Medicaid Other | Admitting: Clinical

## 2019-11-05 ENCOUNTER — Telehealth: Payer: Self-pay | Admitting: Internal Medicine

## 2019-11-06 ENCOUNTER — Encounter: Payer: Self-pay | Admitting: Clinical
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
## 2019-11-14 ENCOUNTER — Other Ambulatory Visit: Payer: Medicaid Other | Admitting: Clinical

## 2019-11-18 ENCOUNTER — Other Ambulatory Visit: Payer: Self-pay

## 2019-11-18 ENCOUNTER — Ambulatory Visit: Payer: Medicaid Other | Admitting: Podiatry

## 2019-11-18 ENCOUNTER — Other Ambulatory Visit: Payer: Medicaid Other

## 2019-11-18 ENCOUNTER — Encounter: Payer: Self-pay | Admitting: Podiatry

## 2019-11-18 VITALS — Temp 96.1°F

## 2019-11-18 DIAGNOSIS — B351 Tinea unguium: Secondary | ICD-10-CM

## 2019-11-18 DIAGNOSIS — E1149 Type 2 diabetes mellitus with other diabetic neurological complication: Secondary | ICD-10-CM | POA: Diagnosis not present

## 2019-11-18 DIAGNOSIS — M79674 Pain in right toe(s): Secondary | ICD-10-CM | POA: Diagnosis not present

## 2019-11-18 DIAGNOSIS — M79675 Pain in left toe(s): Secondary | ICD-10-CM

## 2019-11-19 ENCOUNTER — Telehealth: Payer: Self-pay | Admitting: Clinical

## 2019-11-19 NOTE — Telephone Encounter (Signed)
MSW INTERN LIANA CALLED TO REMIND APPT FOR FRIDAY 04/23 AT 10AM. PATIENT CONFIRMED.

## 2019-11-19 NOTE — Progress Notes (Signed)
Subjective: 54 y.o. returns the office today for painful, elongated, thickened toenails which she cannot trim herself and for calluses which are painful. Denies any redness or drainage around the nails/calluses. Denies any acute changes since last appointment and no new complaints today. Denies any systemic complaints such as fevers, chills, nausea, vomiting.   PCP: Julieanne Manson, MD- last seen 05/09/2019 A1c: 6.2 (09/08/2019)  Objective: AAO 3, NAD DP/PT pulses palpable, CRT less than 3 seconds Sensation decreased with Semmes Weinstein monofilament. Nails hypertrophic, dystrophic, elongated, brittle, discolored 10. There is tenderness overlying the nails 1-5 bilaterally. There is no surrounding erythema or drainage along the nail sites. Hyperkeratotic lesion left submetatarsal 5 with very minimal callus formation.  There is no open sores or signs of infection. No other areas of tenderness bilateral lower extremities. No overlying edema, erythema, increased warmth. No pain with calf compression, swelling, warmth, erythema.  Assessment: Patient presents with symptomatic onychomycosis, hyperkeratotic lesion  Plan: -Treatment options including alternatives, risks, complications were discussed -Nails sharply debrided 10 without complication/bleeding. -Left foot doing much better.  No significant hyperkeratotic tissue to debride today. -Discussed daily foot inspection. If there are any changes, to call the office immediately.  -Follow-up in 3 months or sooner if any problems are to arise. In the meantime, encouraged to call the office with any questions, concerns, changes symptoms.  Ovid Curd, DPM

## 2019-11-21 ENCOUNTER — Ambulatory Visit (INDEPENDENT_AMBULATORY_CARE_PROVIDER_SITE_OTHER): Payer: Self-pay | Admitting: Clinical

## 2019-11-21 ENCOUNTER — Other Ambulatory Visit: Payer: Self-pay

## 2019-11-21 DIAGNOSIS — F419 Anxiety disorder, unspecified: Secondary | ICD-10-CM
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
## 2019-11-26 NOTE — Telephone Encounter (Signed)
Social intern Bonnee Quin) called patient to schedule a therapy session. Appointment will be on 10/09/2019 at 10am video call.

## 2019-11-26 NOTE — Progress Notes (Addendum)
THERAPY PROGRESS NOTE  Session Time: 60 minutes   Participation Level: Active  Behavioral Response: CasualAlertNA  Type of Therapy: Individual Therapy  Treatment Goals addressed: Anxiety and Coping  Interventions: CBT  Summary: [name] [name] is a 54 y.o. female who presents with anxiety after her friend has been admitted to the hospital.  Patient states she wants to talk about her weight loss. Patient states she went from 197 pounds to 133 pounds. Patient states she is not happy about her wight lost and her body. Patient states she is trying to gain weight. Patient states she is scare because her best friend is in the hospital due to drug reaction. Patient states she is afraid because she is recovering addict. Patient states she has been clean since March 08, 2007. Patient states she is trying to quit smoking. Patient states she reached out to quit smoking hotline and was matched with a life coach. Patient states she is following the plan and determine to quit by April 14th.    Suicidal/Homicidal: No  Therapist Response: Patient is dressed casually and neatly. Patient affect, mood, and eye contact are within normal limits. Patient speech was within normal limits. Patient is aware of time and place. Patient became emotional when talking about her weight lost.    Plan: Return again in 2 weeks.  Diagnosis: Adjustment Disorder with Anxiety                            H'yua Kinisha Soper Student-Social Work  10/29/2019

## 2019-11-26 NOTE — Therapy (Signed)
  THERAPY PROGRESS NOTE  Session Time: 60 minutes   Participation Level: Active  Behavioral Response: CasualAlertNA  Type of Therapy: Individual Therapy  Treatment Goals addressed: Anxiety and Coping  Interventions: CBT  Summary: [name] [name] is a 54 y.o. female who presents with anxiety after her friend has been admitted to the hospital.  Patient states she wants to talk about her weight loss. Patient states she went from 197 pounds to 133 pounds. Patient states she is not happy about her wight lost and her body. Patient states she is trying to gain weight. Patient states she is scare because her best friend is in the hospital due to drug reaction. Patient states she is afraid because she is recovering addict. Patient states she has been clean since March 08, 2007. Patient states she is trying to quit smoking. Patient states she reached out to quit smoking hotline and was matched with a life coach. Patient states she is following the plan and determine to quit by April 14th.    Suicidal/Homicidal: No  Therapist Response: Patient is dressed casually and neatly. Patient affect, mood, and eye contact are within normal limits. Patient speech was within normal limits. Patient is aware of time and place. Patient became emotional when talking about her weight lost.    Plan: Return again in 2 weeks.  Diagnosis: Adjustment Disorder with Anxiety      H'yua Danzell Birky, Student-Social Work 11/26/2019       .

## 2019-11-26 NOTE — Progress Notes (Signed)
Social work Theatre manager met with patient for an introduction.

## 2019-11-26 NOTE — Telephone Encounter (Signed)
Social worker called to confirme a therapy appoitment.

## 2019-11-30 ENCOUNTER — Other Ambulatory Visit: Payer: Self-pay | Admitting: Internal Medicine

## 2019-12-02 ENCOUNTER — Telehealth: Payer: Self-pay | Admitting: Internal Medicine

## 2019-12-02 NOTE — Telephone Encounter (Signed)
Spoke with patient. States she is just emotional from losing her dog. Patient dog was hit and killed on Sunday after darting out in the street. Patient states she fell in shower yesterday but she took her tramadol and she is okay for now. Unformed patient to rest and if she is not feeling well or really starts to hurt to call back. Patient verbalized understanding.

## 2019-12-02 NOTE — Telephone Encounter (Signed)
Patient called this morning requesting to speak with Cherice. Patient stated has been having chest and arm numbness the last one x about a week and chest numbness since yesterday.   Patient stated her dog was killed and she has been crying a lot. Patient stated is not feeling well at all.

## 2019-12-05 ENCOUNTER — Ambulatory Visit (INDEPENDENT_AMBULATORY_CARE_PROVIDER_SITE_OTHER): Payer: Self-pay | Admitting: Clinical

## 2019-12-05 ENCOUNTER — Other Ambulatory Visit: Payer: Self-pay

## 2019-12-05 DIAGNOSIS — F419 Anxiety disorder, unspecified: Secondary | ICD-10-CM

## 2019-12-05 NOTE — Progress Notes (Signed)
   INDIVIDUAL THERAPY PROGRESS NOTE (VIRTUAL)  Session Time: 10:00-11:00  Participation Level: Active Behavioral Response: CasualAlertDepressed Type of Therapy: Individual Therapy Treatment Goals addressed: Coping with grief    Purpose: LCSW met with client for routine individual therapy to work towards treatment goals: Coping with Grief   Intervention: LCSW  assessed for any significant events and assessed how they are doing today. LCSW introduced self to patient and discussed therapeutic expectations with purpose to build thereapeutic relationship. LCSW provided reflective listening as patient processed the passing of her dog and pain she is feelings due to illness of a friend. LCSW utilized intervention of Supportive listening as patient processed these feelings and provided validation. Patient and LCSW discussed importance of self care as well especially during difficult times.  LCSW assessed for SI/HI/command psychosis and provided patient with emergency crisis information (sent via text message on updox).  Effectiveness: Patient is alert x4 affect. Patient identified she is feeling sad due to the unexpected passing of her dog (Sunday got ran over). Patient shared chest pain the next day upon processing this it possible she just had a panic attack. LCSW provided self disclosure of similar experience with the purpose to validate patients feeling. Patient shared upset due to a friend who had a stroke and now awake and behaving childlike, patient shared this person is like her older sister and is sad the people she loves are leaving her. Patient recognized importance of processing these feelings because in the past she would've turned to substances. Patient shared she will doing self care by looking at a car today.  Patient verbalized understanding to notify LCSW if unable to meet needs as patient shared in her experience with NA/AA were not effective so she is aware if treatment will be ineffective  and is looking forward to working with this clinician. Intervention was effective as patient was able to reflect on these emotions and appropriately express them in this session. Progress towards goal is Ongoing. Patient denied active suicidal/homicidal/active psychosis.  Plan Patient offered next appointment for: 05/21 10am virtual and if needed prior patient will contact LCSW; plan for next session identify treatment goals.   Diagnosis: Unspecified anxiety disorder     [name], LCSW 12/05/2019

## 2019-12-16 ENCOUNTER — Other Ambulatory Visit: Payer: Self-pay | Admitting: Interventional Cardiology

## 2019-12-16 DIAGNOSIS — I25119 Atherosclerotic heart disease of native coronary artery with unspecified angina pectoris: Secondary | ICD-10-CM

## 2019-12-18 ENCOUNTER — Ambulatory Visit
Admission: RE | Admit: 2019-12-18 | Discharge: 2019-12-18 | Disposition: A | Discharge status: Home / Self Care | Payer: Medicaid Other | Source: Ambulatory Visit | Attending: Internal Medicine | Admitting: Internal Medicine

## 2019-12-18 ENCOUNTER — Other Ambulatory Visit: Payer: Self-pay

## 2019-12-18 DIAGNOSIS — Z780 Asymptomatic menopausal state: Secondary | ICD-10-CM

## 2019-12-18 DIAGNOSIS — Z1231 Encounter for screening mammogram for malignant neoplasm of breast: Secondary | ICD-10-CM

## 2019-12-19 ENCOUNTER — Telehealth (INDEPENDENT_AMBULATORY_CARE_PROVIDER_SITE_OTHER): Payer: Medicaid Other | Admitting: Clinical

## 2019-12-19 DIAGNOSIS — F419 Anxiety disorder, unspecified: Secondary | ICD-10-CM
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
## 2020-01-01 ENCOUNTER — Telehealth: Payer: Self-pay | Admitting: Clinical

## 2020-01-01 NOTE — Telephone Encounter (Signed)
LCSW contacted patient to remind of therapy appointment 06/04 and informed if needed to cancel/reschedule contact office.

## 2020-01-02 ENCOUNTER — Other Ambulatory Visit: Payer: Self-pay

## 2020-01-02 ENCOUNTER — Telehealth (INDEPENDENT_AMBULATORY_CARE_PROVIDER_SITE_OTHER): Payer: Self-pay | Admitting: Clinical

## 2020-01-02 ENCOUNTER — Telehealth: Payer: Medicaid Other | Admitting: Clinical

## 2020-01-02 DIAGNOSIS — F419 Anxiety disorder, unspecified: Secondary | ICD-10-CM
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
## 2020-01-16 ENCOUNTER — Telehealth: Payer: Self-pay | Admitting: Clinical

## 2020-01-16 ENCOUNTER — Telehealth: Payer: Medicaid Other | Admitting: Clinical

## 2020-01-16 NOTE — Telephone Encounter (Signed)
LCSW SENT LINK VIA UPDOX AT 2PM no response. Will call Monday to check in for missed appointment.

## 2020-01-16 NOTE — Progress Notes (Deleted)
LCSW sent link at 2pm. No answer

## 2020-01-16 NOTE — Telephone Encounter (Signed)
LCSW called on 06/17 and left VM as a reminder of session for 06/18

## 2020-01-22 ENCOUNTER — Ambulatory Visit (INDEPENDENT_AMBULATORY_CARE_PROVIDER_SITE_OTHER): Payer: Medicaid Other | Admitting: Clinical

## 2020-01-22 DIAGNOSIS — F419 Anxiety disorder, unspecified: Secondary | ICD-10-CM

## 2020-01-27 NOTE — Progress Notes (Signed)
° °(  virtual via updox) THERAPY PROGRESS NOTE  Session Time: 10:00-11:00am Participation Level: Active Behavioral Response: CasualAlertcooperative Type of Therapy: Individual Therapy Treatment Goals addressed: Anger and Coping   Purpose: LCSW met with client for routine individual therapy to work towards treatment goals: coping skills to manage anger.    Intervention: LCSW  assessed for any significant events and assessed how she is doing today since last session. LCSW utilized intervention of Supportive therapy to decrease symptoms. For this session LCSW provided reflective listening as patient processed updates since last seen. For this session LCSW and patient identified boundaries for self care and protect one's well being. LCSW assessed for SI/HI/command psychosis.  Effectiveness: Patient is alert x4 affect. Patient identified she is okay. Patient shared she contacted [facility] hotlines and was given strategies to practice stop smoking. LCSW praised patient for taking the initiative to begin this. Patient identified took this initiative even though she knows it is bad for her health, she wants to stop for her overall health and knows its bad for her pets. Patient shared went  To physical therapy and it was great.   Patient shared updates regarding her daughter attempting to reach out and daughter was rude to her. Patient shared daughter mentioned she wanted space. Patient processed feelings/thoughts as if daughter has not forgiven her and see's her as the mom who used drugs, patient processed "as if she is not happy I am sober"   Patient processed at least she attempted to reach out. Patient processed working on this not making her angry as she is taking steps to care for her health and noticed being with her dogs has helped maintain calmness. Patient processed sadness seeing her friend health decline and will visit her less as it is triggering and not ready to see her health decline.  Patient will continue to do check in but for right now less.    Intervention was effective as patient was able to reflect. Progress towards goal is Ongoing. Patient denied active suicidal/homicidal/active psychosis.  Plan Patient offered next appointment for: 07/08 Friday 8th 10am  Diagnosis: unspecified anxiety r/o personality disorder     [name], LCSW 01/27/2020

## 2020-01-29 ENCOUNTER — Other Ambulatory Visit: Payer: Self-pay

## 2020-01-29 DIAGNOSIS — E559 Vitamin D deficiency, unspecified: Secondary | ICD-10-CM

## 2020-02-04 LAB — VITAMIN D 25 HYDROXY (VIT D DEFICIENCY, FRACTURES): Vit D, 25-Hydroxy: 19.5 ng/mL — ABNORMAL LOW (ref 30.0–100.0)

## 2020-02-05 ENCOUNTER — Telehealth (INDEPENDENT_AMBULATORY_CARE_PROVIDER_SITE_OTHER): Payer: Medicaid Other | Admitting: Clinical

## 2020-02-05 DIAGNOSIS — F419 Anxiety disorder, unspecified: Secondary | ICD-10-CM

## 2020-02-06 NOTE — Progress Notes (Signed)
   THERAPY PROGRESS NOTE  Session Time: 10:00-11:00am (virtual via updox)  Participation Level: Active Behavioral Response: CasualAlertEuthymic Type of Therapy: Individual Therapy Treatment Goals addressed: Learning to manage and decrease Anger symptoms    Purpose: LCSW met with client for routine individual therapy to work towards treatment goals: learn to manage and decrease anger symptoms coping skills to manage anger.   Intervention: LCSW  assessed for any significant events and assessed how she is doing today since last session. LCSW utilized intervention of DBT STOP Anger Sign to facilitate discussion of steps to utilize to decrease anger as patient shared being able to practice some of these steps on her own with her daughter. LCSW assessed treatment goals and updated. LCSW assessed for SI/HI/command psychosis.  Effectiveness: Patient is alert x4 affect. Patient identified she is okay on this date. Patient shared it was her daughters birthday and daughter hasn't spoken to patient further since their disagreement. Patient shared she called daughter and will go later to drop off her birthday present. Patient shared working on not letting her daughters response affect her after she spoke with her sister. Patient processed learning to look at it from a different approach, "I can't change their minds, but I can change the way I respond to her and other people."  Regarding anger, patient shared she noticed almost wanting to physically hurt somebody but was able to resist herself and reports "I've been praying on it" Patient reports physical is not her approach and has began to notice her warning signs from her mind and body. Patient processed before anger was towards redemption/revenge. Patient reports, "I continued this approach I'm only doing more harm to me and interfering with my goals."  Patient processed as she is practicing skills she has noticed smoking less marijuana from doing it daily  to now at least once a week and is regaining her appetite. Cigarettes, as of Thursday has smoked 2 packs and it was opened on Sunday.      Intervention was effective as patient was able to reflect. Progress towards goal is Achieved. Patient would like to address her cigarrete use and learn to manage to work towards sobriety of cigarette use. Patient denied active suicidal/homicidal/active psychosis.  Plan Patient offered next appointment for: 02/19/2020 10am  Diagnosis: unspecified anxiety disorder w/o personality disorder     [name], LCSW 02/06/2020

## 2020-02-16 ENCOUNTER — Other Ambulatory Visit: Payer: Self-pay | Admitting: Internal Medicine

## 2020-02-16 DIAGNOSIS — E559 Vitamin D deficiency, unspecified: Secondary | ICD-10-CM

## 2020-02-16 MED ORDER — CHOLECALCIFEROL 25 MCG (1000 UT) PO CHEW: CHEWABLE_TABLET | ORAL | 2 refills | Status: DC

## 2020-02-17 ENCOUNTER — Ambulatory Visit: Payer: Medicaid Other | Admitting: Podiatry

## 2020-02-18 ENCOUNTER — Other Ambulatory Visit: Payer: Self-pay | Admitting: Internal Medicine

## 2020-02-19 ENCOUNTER — Telehealth (INDEPENDENT_AMBULATORY_CARE_PROVIDER_SITE_OTHER): Payer: Medicaid Other | Admitting: Clinical

## 2020-02-19 DIAGNOSIS — F1210 Cannabis abuse, uncomplicated: Secondary | ICD-10-CM

## 2020-02-19 DIAGNOSIS — F609 Personality disorder, unspecified: Secondary | ICD-10-CM | POA: Diagnosis not present

## 2020-02-19 DIAGNOSIS — Z716 Tobacco abuse counseling: Secondary | ICD-10-CM | POA: Diagnosis not present
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
## 2020-02-22 NOTE — Progress Notes (Deleted)
Cardiology Office Note   Date:  02/22/2020   ID:  [name] [name], DOB 07-08-1966, MRN [id number]  PCP:  Mack Hook, MD    No chief complaint on file.  CAD  Wt Readings from Last 3 Encounters:  10/22/19 136 lb (61.7 kg)  09/03/19 141 lb (64 kg)  08/29/19 141 lb (64 kg)       History of Present Illness: [name] [name] is a 54 y.o. 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 She had a non-STEMI in May of 2014 and had a stent placed in her circumflex. This was a drug-eluting stent. She had several eye surgeries in late 10-25-12 and early 2013/10/25. She had bleeding issues at that time and her Plavix was stopped before she completed her 44 month course post MI ( May 2014). She had not been on aspirin due to allergy. Her vision in her left eye is severely decreased.   Her sister passed away from cancer in 2016-10-25 and she went through menopause.  She had visual issues due to bleeding after eye surgery.  She mentionedto me that she was involved in a lawsuit with the ophthalmologist, but she states that she had to stop the case due to signing consent.  She did call the medical board about this MD.    Had some chest pain in July 2020.  She felt it was related to anxiety.  Lexiscan Cardiolite was considered but it does not appear that this was done.  Past Medical History:  Diagnosis Date  . Acute myocardial infarction of other lateral wall, initial episode of care   . Acute myocardial infarction, unspecified site, initial episode of care   . Acute osteomyelitis   . Allergic rhinitis   . Anemia   . Anginal pain ([place])    07/15/13- no chest pain in months"  . Anxiety   . Bipolar affective ([place])   . CAD (coronary artery disease) 07/2011   s/p DES mid and distal RCA with 50% LAD  . Daily headache    not daily  . Depression    Bipolar disorder  . Diabetic foot ulcer associated with diabetes mellitus due to underlying condition ([place]) 09/26/2018  . Diabetic  gastroparesis associated with type 2 diabetes mellitus ([place])   . Diabetic peripheral neuropathy associated with type 2 diabetes mellitus ([place])   . Diabetic retinopathy   . Esophageal stenosis   . Esophageal ulcer   . Esophagitis   . Gastroparesis   . Genital herpes    Reportedly tested and documented by [facility]--rare occurrences  . GERD (gastroesophageal reflux disease)   . Heart murmur   . History of stomach ulcers   . Hyperlipidemia   . Hypertension   . Inferior MI ([place]) 01/20/2009   Archie Endo on 12/19/2012, "that's the only one I've had" (12/19/2012)  . Migraines   . Orthopnoea   . Pneumonia October 25, 2010  . Polysubstance abuse ([place])    Crack cocaine--none since October 25, 2006, MJ, ETOH:  clean of all since 2006/10/25  . Renal insufficiency   . Renal lithiasis    Bilateral  . Rheumatoid arthritis(714.0)   . Sebaceous cyst   . Stroke [facility]) 10-25-2009   denies residual on 12/19/2012.  "Years ago"  . Type II diabetes mellitus (HCC)    Previously uncontrolled for many years with multiple complications.  10-26-2015 controlled. 05/2016:  6.7%    Past Surgical History:  Procedure Laterality Date  . CORONARY ANGIOPLASTY WITH  STENT PLACEMENT  01/20/2009   "2" (12/19/2012)  . CORONARY ANGIOPLASTY WITH STENT PLACEMENT  2012   "2" (12/19/2012)  . CORONARY ANGIOPLASTY WITH STENT PLACEMENT  12/19/2012   "2" (12/19/2012)  . ESOPHAGOGASTRODUODENOSCOPY N/A 02/26/2015   Procedure: ESOPHAGOGASTRODUODENOSCOPY (EGD);  Surgeon: Teena Irani, MD;  Location: [facility] ENDOSCOPY;  Service: Endoscopy;  Laterality: N/A;  . EYE SURGERY     Multiple surgeries of both eyes:  last laser was 09/08/2015 of right eye.  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 GAS INSERTION Left 07/16/2013   Procedure: INSERTION OF GAS;  Surgeon: Adonis Brook, MD;  Location: [place];  Service: Ophthalmology;  Laterality: Left;  SF6  . GAS/FLUID EXCHANGE Left 07/30/2013   Procedure: GAS/FLUID EXCHANGE;  Surgeon: Adonis Brook, MD;  Location: [place];   Service: Ophthalmology;  Laterality: Left;  . IRRIGATION AND DEBRIDEMENT SEBACEOUS CYST Right 03/2011   "pointer" (12/19/2012)  . LEFT HEART CATHETERIZATION WITH CORONARY ANGIOGRAM N/A 07/06/2011   Procedure: LEFT HEART CATHETERIZATION WITH CORONARY ANGIOGRAM;  Surgeon: Jettie Booze, MD;  Location[facility] CATH LAB;  Service: Cardiovascular;  Laterality: N/A;  possible PCI  . LEFT HEART CATHETERIZATION WITH CORONARY ANGIOGRAM N/A 12/19/2012   Procedure: LEFT HEART CATHETERIZATION WITH CORONARY ANGIOGRAM;  Surgeon: Jettie Booze, MD;  Location: [facility] CATH LAB;  Service: Cardiovascular;  Laterality: N/A;  . MEMBRANE PEEL Left 07/16/2013   Procedure: MEMBRANE PEEL;  Surgeon: Adonis Brook, MD;  Location: [place];  Service: Ophthalmology;  Laterality: Left;  . PARS PLANA VITRECTOMY Left 07/16/2013   Procedure: PARS PLANA VITRECTOMY WITH 23 GAUGE;  Surgeon: Adonis Brook, MD;  Location: [place];  Service: Ophthalmology;  Laterality: Left;  . PARS PLANA VITRECTOMY Left 07/30/2013   Procedure: PARS PLANA VITRECTOMY WITH 23 GAUGE WITH ENDOLASER;  Surgeon: Adonis Brook, MD;  Location: [place];  Service: Ophthalmology;  Laterality: Left;  with endolaser  . PERCUTANEOUS CORONARY STENT INTERVENTION (PCI-S) N/A 07/06/2011   Procedure: PERCUTANEOUS CORONARY STENT INTERVENTION (PCI-S);  Surgeon: Jettie Booze, MD;  Location: [facility] CATH LAB;  Service: Cardiovascular;  Laterality: N/A;  . PHOTOCOAGULATION WITH LASER Left 07/16/2013   Procedure: PHOTOCOAGULATION WITH LASER;  Surgeon: Adonis Brook, MD;  Location: [place];  Service: Ophthalmology;  Laterality: Left;  ENDOLASER     Current Outpatient Medications  Medication Sig Dispense Refill  . Accu-Chek FastClix Lancets MISC CHECK BLOOD SUGAR 5 TIMES DAILY    . ACCU-CHEK GUIDE test strip USE AS DIRECTED CHECK BLOOD SUGAR 5 TIMES A DAY    . acetaminophen (TYLENOL) 500 MG tablet Take 500-1,000 mg by mouth every 6 (six) hours as needed for moderate pain.     Marland Kitchen  albuterol (PROVENTIL HFA;VENTOLIN HFA) 108 (90 Base) MCG/ACT inhaler Inhale 2 puffs into the lungs every 6 (six) hours as needed for wheezing or shortness of breath. 1 Inhaler 0  . ammonium lactate (AMLACTIN) 12 % cream Apply topically as needed for dry skin. 385 g 0  . Blood Glucose Monitoring Suppl (ACCU-CHEK GUIDE ME) w/Device KIT USE TO MONITOR BLOOD SUGAR FIVE TIMES A DAY DX  E10.22 IN VITRO    . Cholecalciferol 25 MCG (1000 UT) CHEW 1 chew by mouth daily 30 tablet 2  . clopidogrel (PLAVIX) 75 MG tablet Take 1 tablet (75 mg total) by mouth daily. 90 tablet 3  . cyclobenzaprine (FLEXERIL) 10 MG tablet Take 1-2 tablets by mouth at bedtime as needed. Muscle spasms  5  . diphenhydrAMINE (BENADRYL) 25 MG tablet  Take 25 mg by mouth every 6 (six) hours as needed.    . doxycycline (VIBRA-TABS) 100 MG tablet 1 tab by mouth twice daily for 3 days. 6 tablet 0  . fluconazole (DIFLUCAN) 150 MG tablet 1 tab by mouth daily for 3 days. 3 tablet 0  . folic acid (FOLVITE) 1 MG tablet Take 1 mg by mouth daily.    . furosemide (LASIX) 40 MG tablet TAKE 1 TABLET BY MOUTH EVERY DAY IN THE MORNING 30 tablet 11  . gabapentin (NEURONTIN) 100 MG capsule TAKE 1 CAPSULE BY MOUTH AT BEDTIME. 30 capsule 0  . Incontinence Supply Disposable (POISE ULTRA THINS) PADS Use 5 pads daily as needed for urinary stress incontinence 150 each 11  . insulin aspart (NOVOLOG) 100 UNIT/ML injection Inject 4-16 Units into the skin 3 (three) times daily with meals. Sliding scale CBG 100-150; 4 units, 151-200; 6 units, 201-250; 8 units, 251-300; 10 units, 301-350; 12 units, 351-400; 14 units, > 401; call doctor    . LANTUS SOLOSTAR 100 UNIT/ML Solostar Pen Inject 10 Units into the skin 2 (two) times daily.   3  . levocetirizine (XYZAL) 5 MG tablet Take 5 mg by mouth daily.    . methotrexate (RHEUMATREX) 2.5 MG tablet Take 10 mg by mouth once a week.     . metoprolol tartrate (LOPRESSOR) 25 MG tablet TAKE 1 TABLET BY MOUTH TWICE A DAY 60 tablet  11  . nitroGLYCERIN (NITROSTAT) 0.4 MG SL tablet Place 1 tablet (0.4 mg total) under the tongue every 5 (five) minutes as needed for chest pain. Please call and schedule a one year follow up appointment for further refills 25 tablet 0  . ondansetron (ZOFRAN) 4 MG tablet TAKE 1 TABLET (4 MG TOTAL) BY MOUTH EVERY 6 (SIX) HOURS. 12 tablet 0  . pantoprazole (PROTONIX) 40 MG tablet TAKE 1 TABLET (40 MG TOTAL) BY MOUTH 2 (TWO) TIMES DAILY BEFORE A MEAL FOR 30 DAYS, THEN 1 TABLET (40 MG TOTAL) DAILY BEFORE BREAKFAST. 90 tablet 3  . prednisoLONE acetate (PRED FORTE) 1 % ophthalmic suspension Place 4-5 drops into the left eye daily.  6  . rosuvastatin (CRESTOR) 40 MG tablet TAKE 1 TABLET BY MOUTH EVERY DAY 90 tablet 3  . sertraline (ZOLOFT) 100 MG tablet 1 1/2 TABS BY MOUTH DAILY 45 tablet 11  . traMADol (ULTRAM) 50 MG tablet TAKE 1 TABLET EVERY 6 HOURS AS NEEDED FOR MODERATE PAIN 90 tablet 2  . triamcinolone cream (KENALOG) 0.1 % APPLY ON THE SKIN TWICE A DAY X2 WEEKS, TAKE 2 WEEKS OFF THEN REPEAT     No current facility-administered medications for this visit.    Allergies:   Aspirin    Social History:  The patient  reports that she has been smoking cigarettes. She has a 7.26 pack-year smoking history. She has never used smokeless tobacco. She reports current alcohol use. She reports that she does not use drugs.   Family History:  The patient's ***family history includes Breast cancer (age of onset: 11) in her mother; Cancer (age of onset: 69) in her sister; Hypertension in her father and sister; Lung cancer (age of onset: 28) in her sister; Stomach cancer (age of onset: 68) in her sister.    ROS:  Please see the history of present illness.   Otherwise, review of systems are positive for ***.   All other systems are reviewed and negative.    PHYSICAL EXAM: VS:  LMP 01/12/2015  , BMI There is no  height or weight on file to calculate BMI. GEN: Well nourished, well developed, in no acute  distress HEENT: normal Neck: no JVD, carotid bruits, or masses Cardiac: ***RRR; no murmurs, rubs, or gallops,no edema  Respiratory:  clear to auscultation bilaterally, normal work of breathing GI: soft, nontender, nondistended, + BS MS: no deformity or atrophy Skin: warm and dry, no rash Neuro:  Strength and sensation are intact Psych: euthymic mood, full affect   EKG:   The ekg ordered today demonstrates ***   Recent Labs: 08/12/2019: ALT 16; Hemoglobin 12.0; Platelets 342 09/08/2019: BUN 19; Creatinine, Ser 1.14; Potassium 3.6; Sodium 139 10/23/2019: TSH 1.030   Lipid Panel    Component Value Date/Time   CHOL 149 09/08/2019 1503   TRIG 66 09/08/2019 1503   HDL 70 09/08/2019 1503   CHOLHDL 1.9 01/30/2019 0848   CHOLHDL 2.1 06/16/2018 0642   VLDL 14 06/16/2018 0642   LDLCALC 66 09/08/2019 1503     Other studies Reviewed: Additional studies/ records that were reviewed today with results demonstrating: ***.   ASSESSMENT AND PLAN:  1. CAD/Old MI:  2. DM: 3. Hyperlipidemia: 4. Hypertensive heart disease: 5. Tobacco abuse:   Current medicines are reviewed at length with the patient today.  The patient concerns regarding her medicines were addressed.  The following changes have been made:  No change***  Labs/ tests ordered today include: *** No orders of the defined types were placed in this encounter.   Recommend 150 minutes/week of aerobic exercise Low fat, low carb, [place] fiber diet recommended  Disposition:   FU in ***   Signed, Larae Grooms, MD  02/22/2020 9:20 PM    [facility] [place], [place], [place]  [address] Phone: [phone]; Fax: [phone]

## 2020-02-24 ENCOUNTER — Other Ambulatory Visit: Payer: Self-pay

## 2020-02-24 ENCOUNTER — Ambulatory Visit: Payer: Medicaid Other | Admitting: Interventional Cardiology

## 2020-03-02 ENCOUNTER — Encounter: Payer: Self-pay | Admitting: Internal Medicine

## 2020-03-02 ENCOUNTER — Telehealth (INDEPENDENT_AMBULATORY_CARE_PROVIDER_SITE_OTHER): Payer: Medicaid Other | Admitting: Clinical

## 2020-03-02 ENCOUNTER — Other Ambulatory Visit: Payer: Self-pay

## 2020-03-02 ENCOUNTER — Telehealth (INDEPENDENT_AMBULATORY_CARE_PROVIDER_SITE_OTHER): Payer: Medicaid Other | Admitting: Internal Medicine

## 2020-03-02 VITALS — BP 118/68 | HR 84 | Wt 147.0 lb

## 2020-03-02 DIAGNOSIS — Z716 Tobacco abuse counseling: Secondary | ICD-10-CM | POA: Diagnosis not present

## 2020-03-02 DIAGNOSIS — E118 Type 2 diabetes mellitus with unspecified complications: Secondary | ICD-10-CM

## 2020-03-02 DIAGNOSIS — E559 Vitamin D deficiency, unspecified: Secondary | ICD-10-CM | POA: Diagnosis not present

## 2020-03-02 DIAGNOSIS — F609 Personality disorder, unspecified: Secondary | ICD-10-CM | POA: Diagnosis not present

## 2020-03-02 DIAGNOSIS — L659 Nonscarring hair loss, unspecified: Secondary | ICD-10-CM | POA: Diagnosis not present

## 2020-03-02 DIAGNOSIS — F4329 Adjustment disorder with other symptoms: Secondary | ICD-10-CM

## 2020-03-02 DIAGNOSIS — I25118 Atherosclerotic heart disease of native coronary artery with other forms of angina pectoris: Secondary | ICD-10-CM

## 2020-03-02 DIAGNOSIS — I10 Essential (primary) hypertension: Secondary | ICD-10-CM

## 2020-03-02 NOTE — Progress Notes (Signed)
[facility]   THERAPY PROGRESS NOTE  Session Time: 9:00-10:00am Participation Level: Active Behavioral Response: CasualAlertDepressed Type of Therapy: Individual Therapy Treatment Goals addressed: Coping with grief due to recent pet loss and anger management skills.    Purpose: LCSW met with client for routine individual therapy to work towards treatment goals: Coping with grief due to recent pet loss and anger management skills.   Intervention: LCSW  assessed for any significant events and assessed how they are doing today. LCSW provided reflective listening as patient processed her loss. LCSW utilized intervention of [employer] to cope with recent pet loss. LCSW utilized handout from  helpguide.org to discuss coping skills for pet loss. LCSW provided petloss hotline LCSW assessed for SI/HI/command psychosis.  Effectiveness: Patient is alert x4 affect. Patient shared her cat passed away on 10-30-2022. Reports she is sad but also angry due to comments from others such as it was just a pet. LCSW validated her feelings as her feelings are normal reaction to grief and the role of her pet in her life.  Patient shared awareness of not wanting to let this grief lead to depression episode or act on her anger. Patient identified wanting to continue to work on herself for her wellbeing. Patient identified awareness of only able to control her thoughts,feelings and behaviors. Patient identified physically feeling tired- LCSW informed this is an area where grief can affect, LCSW encouraged to keep DR appt, continue therapy and continued to do her daily activities.  Patient identified uncertainty in regards to her relationship with her ex-boyfriend who is now a friend but uses substances. Patient reports she does not want to return to these unhealthy behaviors. Patient identified self worth and values and choosing what is best for her. Patient provided update on relationship with her daughter as they met and  discussed things and identified a lot of pain and is hopeful to work together on it.   Intervention was effective as patient was able to reflect and participate in session. Patient was able to self talk and challenge her maladaptive thoughts in regards to her painful emotions (ex anger). Patient would like to discuss further coping skills for smoking and discuss differences between personality vs bipolar. Progress towards goal is Ongoing. Patient denied active suicidal/homicidal/active psychosis.  LCSW informed of crisis numbers/services as LCSW will be on vacation.   Plan Patient offered next appointment for: 08/19 at 10am via virtual.   Next session with assess borderline personality screener and dx, and continue to learn coping skills to cope with grief due to pet loss, and smoking.  Diagnosis: Bereavement and unspecified personality disorder     [name], LCSW 03/02/2020

## 2020-03-02 NOTE — Progress Notes (Signed)
Subjective:    Patient ID: [name] [name], female   DOB: [birth year], 54 y.o.   MRN: [id number]   HPI   1.  Hair loss:  Rheum increased her folic acid as could be from MTX.  She is only taking 800 mcg with her OTC Folate.   She is also concerned Metoprolol causing hair loss as well.  2.  DM:  Sees Dr. Buddy Duty.  Cannot recall last appt or last A1C.  Last A1C here was 6.2% in 09/2019.  Sugars dropped down to 26 end of June.  EMS called and was treated at home with IM shot.  She refused transport. Lost her dog and her cat recently.  She has not had a low like that since. States drops as she gives herself insulin and then doesn't eat when the drops occurs.  Her weight is going up and down based on her stressors.   Last weight in office 136 lbs.  Last weight with Dr. Earnest Conroy in May at 143 lbs.  She feels she weighs more now.    3.  Vitamin D deficiency:  She is taking 1000 units of D3 and drinking Boost twice daily. She is willing to drink a cup of Almond milk daily as well.  Not doing exercises.  Was doing PT, but stopped.  4.  Diabetic foot ulcers:  All healed and still following with podiatry.    5.  CAD:  Has some chest pain with crying after loss of pets.  After she calms, resolves.  She will take NTG if seems to be prolonged.  Discussed should not allow to last long before taking NTG.   6.  Depression/anger/grief:  Working with Dwan Bolt, LCSW.  Counseling every 2 weeks.  She does still have another dog and cat.  She feels her meds for depression are fine.  Her cat just died 2 days ago, her dog 2 months ago.    7.  COVID vaccination complete.  8.  Tobacco abuse:  Still smoking --increases every time loses a pet.  Ileana and she are working on this as well.  9.  Skin lesions:  No longer bumps, but still with residual hyperpigmentation.  Applying Vitamin E.  Current Meds  Medication Sig   Accu-Chek FastClix Lancets MISC CHECK BLOOD SUGAR 5 TIMES DAILY   ACCU-CHEK GUIDE test strip USE AS DIRECTED  CHECK BLOOD SUGAR 5 TIMES A DAY   acetaminophen (TYLENOL) 500 MG tablet Take 500-1,000 mg by mouth every 6 (six) hours as needed for moderate pain.    albuterol (PROVENTIL HFA;VENTOLIN HFA) 108 (90 Base) MCG/ACT inhaler Inhale 2 puffs into the lungs every 6 (six) hours as needed for wheezing or shortness of breath.   ammonium lactate (AMLACTIN) 12 % cream Apply topically as needed for dry skin.   Blood Glucose Monitoring Suppl (ACCU-CHEK GUIDE ME) w/Device KIT USE TO MONITOR BLOOD SUGAR FIVE TIMES A DAY DX  E10.22 IN VITRO   Cholecalciferol 25 MCG (1000 UT) CHEW 1 chew by mouth daily   clopidogrel (PLAVIX) 75 MG tablet Take 1 tablet (75 mg total) by mouth daily.   cyclobenzaprine (FLEXERIL) 10 MG tablet Take 1-2 tablets by mouth at bedtime as needed. Muscle spasms   diphenhydrAMINE (BENADRYL) 25 MG tablet Take 25 mg by mouth every 6 (six) hours as needed.   folic acid (FOLVITE) 883 MCG tablet Take 400 mcg by mouth daily. 3 tabs by mouth daily   furosemide (LASIX) 40 MG tablet TAKE 1 TABLET  BY MOUTH EVERY DAY IN THE MORNING   gabapentin (NEURONTIN) 100 MG capsule TAKE 1 CAPSULE BY MOUTH AT BEDTIME.   Incontinence Supply Disposable (POISE ULTRA THINS) PADS Use 5 pads daily as needed for urinary stress incontinence   insulin aspart (NOVOLOG) 100 UNIT/ML injection Inject 4-16 Units into the skin 3 (three) times daily with meals. Sliding scale CBG 100-150; 4 units, 151-200; 6 units, 201-250; 8 units, 251-300; 10 units, 301-350; 12 units, 351-400; 14 units, > 401; call doctor   LANTUS SOLOSTAR 100 UNIT/ML Solostar Pen Inject 10 Units into the skin 2 (two) times daily.    levocetirizine (XYZAL) 5 MG tablet Take 5 mg by mouth daily.   methotrexate (RHEUMATREX) 2.5 MG tablet Take 10 mg by mouth once a week.    metoprolol tartrate (LOPRESSOR) 25 MG tablet TAKE 1 TABLET BY MOUTH TWICE A DAY   nitroGLYCERIN (NITROSTAT) 0.4 MG SL tablet Place 1 tablet (0.4 mg total) under the tongue every 5  (five) minutes as needed for chest pain. Please call and schedule a one year follow up appointment for further refills   ondansetron (ZOFRAN) 4 MG tablet TAKE 1 TABLET (4 MG TOTAL) BY MOUTH EVERY 6 (SIX) HOURS.   pantoprazole (PROTONIX) 40 MG tablet TAKE 1 TABLET (40 MG TOTAL) BY MOUTH 2 (TWO) TIMES DAILY BEFORE A MEAL FOR 30 DAYS, THEN 1 TABLET (40 MG TOTAL) DAILY BEFORE BREAKFAST.   prednisoLONE acetate (PRED FORTE) 1 % ophthalmic suspension Place 4-5 drops into the left eye daily.   rosuvastatin (CRESTOR) 40 MG tablet TAKE 1 TABLET BY MOUTH EVERY DAY   sertraline (ZOLOFT) 100 MG tablet 1 1/2 TABS BY MOUTH DAILY   traMADol (ULTRAM) 50 MG tablet TAKE 1 TABLET EVERY 6 HOURS AS NEEDED FOR MODERATE PAIN   triamcinolone cream (KENALOG) 0.1 % APPLY ON THE SKIN TWICE A DAY X2 WEEKS, TAKE 2 WEEKS OFF THEN REPEAT   Allergies  Allergen Reactions   Aspirin Other (See Comments)    Stomach bleeds Stomach bleeds GI bleeding Other reaction(s): Other (See Comments) GI bleeding Stomach bleeds      Review of Systems    Objective:   LMP 01/12/2015   Physical Exam  Looks well Tearful when discussing her pets   Assessment & Plan  1.  Hair loss:  Was told by Dr. Earnest Conroy., Rheum, to increase her folic acid, but apparently not taking the full 1 mg even now.  To increase her 400 mcg tabs to 3 tabs daily.   Hold on switching other meds at this time.  2.  DM:  Will have her go to [place] [facility] for labs as she is a hard stick:  A1C, CBC, CmP.  Followed by Dr. Buddy Duty, Endocrine. Discussed making sure she has meal almost prepared when she gives herself Novolog as she should not be waiting longer than 10-15 minutes to eat after insulin.  States she has already moved to doing that after her low blood sugar in June.  3.  RA:  Followed by Dr. Dudley Major, Rheumatology.  Discussed if she is needing to fill Tramadol every 2 weeks, she needs to bring this up to Dr. Earnest Conroy.  She states taking for arthritis  pain.  4.  Hypertension/CAD:  Stable.  5.  Depression/Grief:  Feels working with Dwan Bolt, LCSW has been very beneficial and plans to continue sessions.  6.   Vitamin D deficiency with osteopenia:  Risk of hip fracture or osteoporotic fracture support replacing Vitamin D and making  sure she is supplementing adequately with Calcium.  To add another serving of milk--preferably non sweetened almond milk.  To find something physically active to do--walking.  No bisphosphonate for now.  Repeat DEXA in 2 years.   7.  Tobacco abuse:  Working on this also with [employer].   8.  Skin lesions/hyperpigmentation:  She feels she does not need a fade cream as already fading.  Agree.   Labs in next week, schedule for influenza vaccine, Vitamin D level in October and CPE in February.

## 2020-03-02 NOTE — Addendum Note (Signed)
Addended by: Marcelino Freestone on: 03/02/2020 03:48 PM   Modules accepted: Orders

## 2020-03-08 ENCOUNTER — Ambulatory Visit: Payer: Medicaid Other | Admitting: Podiatry

## 2020-03-08 ENCOUNTER — Other Ambulatory Visit: Payer: Self-pay

## 2020-03-08 DIAGNOSIS — M79675 Pain in left toe(s): Secondary | ICD-10-CM

## 2020-03-08 DIAGNOSIS — E1149 Type 2 diabetes mellitus with other diabetic neurological complication: Secondary | ICD-10-CM | POA: Diagnosis not present

## 2020-03-08 DIAGNOSIS — L84 Corns and callosities: Secondary | ICD-10-CM

## 2020-03-08 DIAGNOSIS — M2041 Other hammer toe(s) (acquired), right foot: Secondary | ICD-10-CM

## 2020-03-08 DIAGNOSIS — M79674 Pain in right toe(s): Secondary | ICD-10-CM | POA: Diagnosis not present

## 2020-03-08 DIAGNOSIS — M2042 Other hammer toe(s) (acquired), left foot: Secondary | ICD-10-CM

## 2020-03-08 DIAGNOSIS — B351 Tinea unguium: Secondary | ICD-10-CM

## 2020-03-15 NOTE — Progress Notes (Signed)
Subjective: 54 y.o. returns the office today for painful, elongated, thickened toenails which she cannot trim herself and for calluses which are painful. Denies any redness or drainage around the nails/calluses.  Asked with diabetic shoes today.  Denies any acute changes since last appointment and no new complaints today. Denies any systemic complaints such as fevers, chills, nausea, vomiting.   PCP: Julieanne Manson, MD- last seen 03/02/2020 Endocrinologist: Dr. Talmage Coin, MD A1c: 6.2 (09/08/2019)  Objective: AAO 3, NAD DP/PT pulses palpable, CRT less than 3 seconds Sensation decreased with Semmes Weinstein monofilament. Nails hypertrophic, dystrophic, elongated, brittle, discolored 10. There is tenderness overlying the nails 1-5 bilaterally. There is no surrounding erythema or drainage along the nail sites. Minimal hyperkeratotic lesion left submetatarsal 5 with very minimal callus formation.  There is no open sores or signs of infection. Hammertoes present No other areas of tenderness bilateral lower extremities. No overlying edema, erythema, increased warmth. No pain with calf compression, swelling, warmth, erythema.  Assessment: Patient presents with symptomatic onychomycosis, hyperkeratotic lesion  Plan: -Treatment options including alternatives, risks, complications were discussed -Nails sharply debrided 10 without complication/bleeding. -Follow-up with [facility] for diabetic shoes -Discussed daily foot inspection. If there are any changes, to call the office immediately.  -Follow-up in 3 months or sooner if any problems are to arise. In the meantime, encouraged to call the office with any questions, concerns, changes symptoms.  Ovid Curd, DPM

## 2020-03-18 ENCOUNTER — Other Ambulatory Visit: Payer: Self-pay | Admitting: Interventional Cardiology

## 2020-03-18 ENCOUNTER — Telehealth (INDEPENDENT_AMBULATORY_CARE_PROVIDER_SITE_OTHER): Payer: Medicaid Other | Admitting: Clinical

## 2020-03-18 DIAGNOSIS — I25119 Atherosclerotic heart disease of native coronary artery with unspecified angina pectoris: Secondary | ICD-10-CM

## 2020-03-18 DIAGNOSIS — F609 Personality disorder, unspecified: Secondary | ICD-10-CM

## 2020-03-18 MED ORDER — NITROGLYCERIN 0.4 MG SL SUBL: 0.4000 mg | SUBLINGUAL_TABLET | SUBLINGUAL | 0 refills | Status: DC | PRN

## 2020-03-22 ENCOUNTER — Telehealth: Payer: Self-pay | Admitting: Internal Medicine

## 2020-03-22 NOTE — Telephone Encounter (Signed)
To Dr. Delrae Alfred to place order

## 2020-03-22 NOTE — Telephone Encounter (Signed)
[name] [name] - Care Manager at [facility] [phone] called stating pt is on need of diabetic shoes and needs an order placed for that.

## 2020-03-23 NOTE — Progress Notes (Signed)
THERAPY PROGRESS NOTE Virtual via updox  Session Time: 10:00-11:00am Participation Level: Active Behavioral Response: CasualAlertEuthymic Type of Therapy: Individual Therapy Treatment Goals addressed: Coping w/triggers for relapse prevention.    Purpose: LCSW met with patient for routine individual therapy to work towards treatment goals: learning coping skills to manage triggers for relapse prevention.   Intervention: LCSW met with patient for routine individual therapy. LCSW provided patient a brief check in to assess how she is doing since previous session. LCSW provided reflective listening skills as patient processed recent events and her feelings. LCSW utilized intervention of Relapse Prevention skills for patient to identify events that led to trigger of curiosity that could've led to relapse. In this session patient challenged her maladaptive thinking that led to this curiosity and feelings. LCSW utilized article of suggestions to manage drug dreams and sent this via txt on updox for reference.  LCSW assessed for SI/HI/command psychosis.  Effectiveness: Patient is alert x4 affect. Patient checked in that she fell last week, identified she drunk too much that had her fall and hurt her side. Patient identified from her grief with her cat, to having drug dreams lead to curiosity to see the quantity of the drugs and think could she be capable of selling it- thoughts regarding financial. Patient identified she not use the drugs after seeing it different from what she used to do but also thought to herself during her addiction not only did she hurt the ones she loved, but herself too. Patient processed her past addiction behavioral and events that led to it creating more anger leading to wanting to act on it. . Patient shared continued motivation to change this way of thinking as her goal is to be happy and to experience life as if it was the last day. Patient shared in regards to the article  suggestions practicing them already by recording her replay her dreams. Patient accepted idea of checking in to the dreams, what feelings is she having to help challenge them if it is a unpleasant feeling. Patient identified can reach out to her sister as she reports sisters also helps her thinking and appreciates her wisdom. Intervention was effective as patient was able to reflect and identified alternatives to utilize when she has drug dreams for relapse prevention.. Progress towards goal is Ongoing. Patient denied active suicidal/homicidal/active psychosis.  Plan Patient offered next appointment for: 09/02 10am  Diagnosis: unspecified personality     [name], LCSW 03/23/2020

## 2020-03-25 ENCOUNTER — Other Ambulatory Visit: Payer: Self-pay

## 2020-03-25 ENCOUNTER — Encounter: Payer: Self-pay | Admitting: Internal Medicine

## 2020-03-25 ENCOUNTER — Ambulatory Visit
Admission: RE | Admit: 2020-03-25 | Discharge: 2020-03-25 | Disposition: A | Discharge status: Home / Self Care | Payer: Medicaid Other | Source: Ambulatory Visit | Attending: Interventional Cardiology | Admitting: Interventional Cardiology

## 2020-03-25 ENCOUNTER — Encounter: Payer: Self-pay | Admitting: Interventional Cardiology

## 2020-03-25 ENCOUNTER — Ambulatory Visit (INDEPENDENT_AMBULATORY_CARE_PROVIDER_SITE_OTHER): Payer: Medicaid Other | Admitting: Interventional Cardiology

## 2020-03-25 ENCOUNTER — Ambulatory Visit (INDEPENDENT_AMBULATORY_CARE_PROVIDER_SITE_OTHER): Payer: Medicaid Other | Admitting: Internal Medicine

## 2020-03-25 VITALS — BP 92/58 | HR 70 | Resp 12 | Ht 61.0 in | Wt 148.0 lb

## 2020-03-25 VITALS — BP 106/60 | HR 69 | Ht 61.0 in | Wt 147.0 lb

## 2020-03-25 DIAGNOSIS — R0781 Pleurodynia: Secondary | ICD-10-CM

## 2020-03-25 DIAGNOSIS — R0789 Other chest pain: Secondary | ICD-10-CM | POA: Diagnosis not present

## 2020-03-25 DIAGNOSIS — E785 Hyperlipidemia, unspecified: Secondary | ICD-10-CM

## 2020-03-25 DIAGNOSIS — E1159 Type 2 diabetes mellitus with other circulatory complications: Secondary | ICD-10-CM | POA: Diagnosis not present

## 2020-03-25 DIAGNOSIS — Z79899 Other long term (current) drug therapy: Secondary | ICD-10-CM | POA: Diagnosis not present

## 2020-03-25 DIAGNOSIS — I130 Hypertensive heart and chronic kidney disease with heart failure and stage 1 through stage 4 chronic kidney disease, or unspecified chronic kidney disease: Secondary | ICD-10-CM | POA: Diagnosis not present

## 2020-03-25 DIAGNOSIS — Z720 Tobacco use: Secondary | ICD-10-CM

## 2020-03-25 DIAGNOSIS — I25119 Atherosclerotic heart disease of native coronary artery with unspecified angina pectoris: Secondary | ICD-10-CM

## 2020-03-25 NOTE — Progress Notes (Signed)
Cardiology Office Note   Date:  03/25/2020   ID:  [name] [name], DOB 1965/08/09, MRN [id number]  PCP:  Mack Hook, MD    No chief complaint on file.  CAD  Wt Readings from Last 3 Encounters:  03/25/20 147 lb (66.7 kg)  03/02/20 147 lb (66.7 kg)  10/22/19 136 lb (61.7 kg)       History of Present Illness: [name] [name] is a 54 y.o. 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 She had a non-STEMI in May of 2014 and had a stent placed in her circumflex. This was a drug-eluting stent. She had several eye surgeries in late 2012/09/25 and early 09/25/13. She had bleeding issues at that time and her Plavix was stopped before she completed her 45 month course post MI ( May 2014). She had not been on aspirin due to allergy. Her vision in her left eye is severely decreased.   Her sister passed away from cancer in 09-25-2016 and she went through menopause.  She had visual issues due to bleeding after eye surgery.  She mentionedto me that she was involved in a lawsuit with the ophthalmologist, but she states that she had to stop the case due to signing consent.  She did call the medical board about this MD.    Had some chest pain in July 2020.  She felt it was related to anxiety.  Lexiscan Cardiolite was considered but it does not appear that this was done.  Since the last visit, she has had some issues.  In early 8/21, she fell off of her bed and has had persistent pain and heaviness in the left side of the lower chest and upper abdomen. She will be seeing her PMD later today.   Prior to the fall, she was doing well.  No chest pain with walking, which she had been doing daily.    Past Medical History:  Diagnosis Date  . Acute myocardial infarction of other lateral wall, initial episode of care   . Acute myocardial infarction, unspecified site, initial episode of care   . Acute osteomyelitis   . Allergic rhinitis   . Anemia   . Anginal pain ([place])    07/15/13- no chest  pain in months"  . Anxiety   . Bipolar affective ([place])   . CAD (coronary artery disease) 07/2011   s/p DES mid and distal RCA with 50% LAD  . Daily headache    not daily  . Depression    Bipolar disorder  . Diabetic foot ulcer associated with diabetes mellitus due to underlying condition ([place]) 09/26/2018  . Diabetic gastroparesis associated with type 2 diabetes mellitus ([place])   . Diabetic peripheral neuropathy associated with type 2 diabetes mellitus ([place])   . Diabetic retinopathy   . Esophageal stenosis   . Esophageal ulcer   . Esophagitis   . Gastroparesis   . Genital herpes    Reportedly tested and documented by [facility]--rare occurrences  . GERD (gastroesophageal reflux disease)   . Heart murmur   . History of stomach ulcers   . Hyperlipidemia   . Hypertension   . Inferior MI ([place]) 01/20/2009   Archie Endo on 12/19/2012, "that's the only one I've had" (12/19/2012)  . Migraines   . Orthopnoea   . Pneumonia 09-25-10  . Polysubstance abuse ([place])    Crack cocaine--none since 25-Sep-2006, MJ, ETOH:  clean of all since 09-25-06  . Renal insufficiency   .  Renal lithiasis    Bilateral  . Rheumatoid arthritis(714.0)   . Sebaceous cyst   . Stroke [facility]) 2011   denies residual on 12/19/2012.  "Years ago"  . Type II diabetes mellitus (H[facility])    Previously uncontrolled for many years with multiple complications.  2017 controlled. 05/2016:  6.7%    Past Surgical History:  Procedure Laterality Date  . CORONARY ANGIOPLASTY WITH STENT PLACEMENT  01/20/2009   "2" (12/19/2012)  . CORONARY ANGIOPLASTY WITH STENT PLACEMENT  2012   "2" (12/19/2012)  . CORONARY ANGIOPLASTY WITH STENT PLACEMENT  12/19/2012   "2" (12/19/2012)  . ESOPHAGOGASTRODUODENOSCOPY N/A 02/26/2015   Procedure: ESOPHAGOGASTRODUODENOSCOPY (EGD);  Surgeon: Teena Irani, MD;  Location: [facility] ENDOSCOPY;  Service: Endoscopy;  Laterality: N/A;  . EYE SURGERY     Multiple surgeries of both eyes:  last laser was 09/08/2015 of right eye.  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 GAS INSERTION Left 07/16/2013   Procedure: INSERTION OF GAS;  Surgeon: Adonis Brook, MD;  Location: [place];  Service: Ophthalmology;  Laterality: Left;  SF6  . GAS/FLUID EXCHANGE Left 07/30/2013   Procedure: GAS/FLUID EXCHANGE;  Surgeon: Adonis Brook, MD;  Location: [place];  Service: Ophthalmology;  Laterality: Left;  . IRRIGATION AND DEBRIDEMENT SEBACEOUS CYST Right 03/2011   "pointer" (12/19/2012)  . LEFT HEART CATHETERIZATION WITH CORONARY ANGIOGRAM N/A 07/06/2011   Procedure: LEFT HEART CATHETERIZATION WITH CORONARY ANGIOGRAM;  Surgeon: Jettie Booze, MD;  Location: [facility] CATH LAB;  Service: Cardiovascular;  Laterality: N/A;  possible PCI  . LEFT HEART CATHETERIZATION WITH CORONARY ANGIOGRAM N/A 12/19/2012   Procedure: LEFT HEART CATHETERIZATION WITH CORONARY ANGIOGRAM;  Surgeon: Jettie Booze, MD;  Location: [facility] CATH LAB;  Service: Cardiovascular;  Laterality: N/A;  . MEMBRANE PEEL Left 07/16/2013   Procedure: MEMBRANE PEEL;  Surgeon: Adonis Brook, MD;  Location: [place];  Service: Ophthalmology;  Laterality: Left;  . PARS PLANA VITRECTOMY Left 07/16/2013   Procedure: PARS PLANA VITRECTOMY WITH 23 GAUGE;  Surgeon: Adonis Brook, MD;  Location: [place];  Service: Ophthalmology;  Laterality: Left;  . PARS PLANA VITRECTOMY Left 07/30/2013   Procedure: PARS PLANA VITRECTOMY WITH 23 GAUGE WITH ENDOLASER;  Surgeon: Adonis Brook, MD;  Location: [place];  Service: Ophthalmology;  Laterality: Left;  with endolaser  . PERCUTANEOUS CORONARY STENT INTERVENTION (PCI-S) N/A 07/06/2011   Procedure: PERCUTANEOUS CORONARY STENT INTERVENTION (PCI-S);  Surgeon: Jettie Booze, MD;  Location: [facility] CATH LAB;  Service: Cardiovascular;  Laterality: N/A;  . PHOTOCOAGULATION WITH LASER Left 07/16/2013   Procedure: PHOTOCOAGULATION WITH LASER;  Surgeon: Adonis Brook, MD;  Location: [place];  Service: Ophthalmology;  Laterality: Left;  ENDOLASER     Current Outpatient  Medications  Medication Sig Dispense Refill  . Accu-Chek FastClix Lancets MISC CHECK BLOOD SUGAR 5 TIMES DAILY    . ACCU-CHEK GUIDE test strip USE AS DIRECTED CHECK BLOOD SUGAR 5 TIMES A DAY    . acetaminophen (TYLENOL) 500 MG tablet Take 500-1,000 mg by mouth every 6 (six) hours as needed for moderate pain.     Marland Kitchen albuterol (PROVENTIL HFA;VENTOLIN HFA) 108 (90 Base) MCG/ACT inhaler Inhale 2 puffs into the lungs every 6 (six) hours as needed for wheezing or shortness of breath. 1 Inhaler 0  . ammonium lactate (AMLACTIN) 12 % cream Apply topically as needed for dry skin. 385 g 0  . Blood Glucose Monitoring Suppl (ACCU-CHEK GUIDE ME) w/Device KIT USE TO MONITOR BLOOD SUGAR FIVE TIMES A DAY  DX  E10.22 IN VITRO    . Cholecalciferol 25 MCG (1000 UT) CHEW 1 chew by mouth daily 30 tablet 2  . clopidogrel (PLAVIX) 75 MG tablet Take 1 tablet (75 mg total) by mouth daily. 90 tablet 3  . cyclobenzaprine (FLEXERIL) 10 MG tablet Take 1-2 tablets by mouth at bedtime as needed. Muscle spasms  5  . diphenhydrAMINE (BENADRYL) 25 MG tablet Take 25 mg by mouth every 6 (six) hours as needed.    . folic acid (FOLVITE) 573 MCG tablet Take 400 mcg by mouth daily. 3 tabs by mouth daily    . furosemide (LASIX) 40 MG tablet TAKE 1 TABLET BY MOUTH EVERY DAY IN THE MORNING 30 tablet 11  . gabapentin (NEURONTIN) 100 MG capsule TAKE 1 CAPSULE BY MOUTH AT BEDTIME. 30 capsule 0  . Incontinence Supply Disposable (POISE ULTRA THINS) PADS Use 5 pads daily as needed for urinary stress incontinence 150 each 11  . insulin aspart (NOVOLOG) 100 UNIT/ML injection Inject 4-16 Units into the skin 3 (three) times daily with meals. Sliding scale CBG 100-150; 4 units, 151-200; 6 units, 201-250; 8 units, 251-300; 10 units, 301-350; 12 units, 351-400; 14 units, > 401; call doctor    . LANTUS SOLOSTAR 100 UNIT/ML Solostar Pen Inject 10 Units into the skin 2 (two) times daily.   3  . methotrexate (RHEUMATREX) 2.5 MG tablet Take 10 mg by mouth once  a week.     . metoprolol tartrate (LOPRESSOR) 25 MG tablet TAKE 1 TABLET BY MOUTH TWICE A DAY 60 tablet 11  . nitroGLYCERIN (NITROSTAT) 0.4 MG SL tablet Place 1 tablet (0.4 mg total) under the tongue every 5 (five) minutes as needed for chest pain. Please keep upcoming appt in August for future refills. Thank you 25 tablet 0  . ondansetron (ZOFRAN) 4 MG tablet TAKE 1 TABLET (4 MG TOTAL) BY MOUTH EVERY 6 (SIX) HOURS. 12 tablet 0  . pantoprazole (PROTONIX) 40 MG tablet TAKE 1 TABLET (40 MG TOTAL) BY MOUTH 2 (TWO) TIMES DAILY BEFORE A MEAL FOR 30 DAYS, THEN 1 TABLET (40 MG TOTAL) DAILY BEFORE BREAKFAST. 90 tablet 3  . prednisoLONE acetate (PRED FORTE) 1 % ophthalmic suspension Place 4-5 drops into the left eye daily.  6  . rosuvastatin (CRESTOR) 40 MG tablet Take 1 tablet (40 mg total) by mouth daily. Please keep upcoming appt in August before anymore refills. Thank you 90 tablet 0  . sertraline (ZOLOFT) 100 MG tablet 1 1/2 TABS BY MOUTH DAILY 45 tablet 11  . traMADol (ULTRAM) 50 MG tablet TAKE 1 TABLET EVERY 6 HOURS AS NEEDED FOR MODERATE PAIN 90 tablet 2  . triamcinolone cream (KENALOG) 0.1 % APPLY ON THE SKIN TWICE A DAY X2 WEEKS, TAKE 2 WEEKS OFF THEN REPEAT     No current facility-administered medications for this visit.    Allergies:   Aspirin    Social History:  The patient  reports that she has been smoking cigarettes. She has a 7.26 pack-year smoking history. She has never used smokeless tobacco. She reports current alcohol use. She reports that she does not use drugs.   Family History:  The patient's family history includes Breast cancer (age of onset: 59) in her mother; Cancer (age of onset: 27) in her sister; Hypertension in her father and sister; Lung cancer (age of onset: 26) in her sister; Stomach cancer (age of onset: 63) in her sister.    ROS:  Please see the history of present illness.  Otherwise, review of systems are positive for left upper abdominal pain after fall.   All  other systems are reviewed and negative.    PHYSICAL EXAM: VS:  BP 106/60   Pulse 69   Ht 5' 1" (1.549 m)   Wt 147 lb (66.7 kg)   LMP 01/12/2015   SpO2 98%   BMI 27.78 kg/m  , BMI Body mass index is 27.78 kg/m. GEN: Well nourished, well developed, in no acute distress  HEENT: normal  Neck: no JVD, carotid bruits, or masses Cardiac: RRR; no murmurs, rubs, or gallops,no edema  Respiratory:  clear to auscultation bilaterally, normal work of breathing GI: soft, nontender, nondistended, + BS MS: no deformity or atrophy  Skin: warm and dry, no rash Neuro:  Strength and sensation are intact Psych: euthymic mood, full affect   EKG:   The ekg ordered today demonstrates NSR, inferior ST changes, unchanged from prior ECG   Recent Labs: 08/12/2019: ALT 16; Hemoglobin 12.0; Platelets 342 09/08/2019: BUN 19; Creatinine, Ser 1.14; Potassium 3.6; Sodium 139 10/23/2019: TSH 1.030   Lipid Panel    Component Value Date/Time   CHOL 149 09/08/2019 1503   TRIG 66 09/08/2019 1503   HDL 70 09/08/2019 1503   CHOLHDL 1.9 01/30/2019 0848   CHOLHDL 2.1 06/16/2018 0642   VLDL 14 06/16/2018 0642   LDLCALC 66 09/08/2019 1503     Other studies Reviewed: Additional studies/ records that were reviewed today with results demonstrating: Prior labs reviewed.   ASSESSMENT AND PLAN:  1. CAD/Old MI: Atypical pain, likely musculoskeletal from fall.  No angina prior to fall.  Plan for left sided chest xray to look for broken ribs as a cause of pain.  She will see PMD later today.   2. DM: A1C 7.6.  Followed by Dr. Buddy Duty. 3. Hyperlipidemia: LDL 66 in 2/21.  COntinue lipid lowering therapy. 4. Hypertensive heart disease: The current medical regimen is effective;  continue present plan and medications. 5. Tobacco abuse: Still smoking, but has cut back.   Current medicines are reviewed at length with the patient today.  The patient concerns regarding her medicines were addressed.  The following changes  have been made:  No change  Labs/ tests ordered today include:  No orders of the defined types were placed in this encounter.   Recommend 150 minutes/week of aerobic exercise Low fat, low carb, [place] fiber diet recommended  Disposition:   FU in 6 months   Signed, Larae Grooms, MD  03/25/2020 9:25 AM    [facility] [place], [place], [place]  [address] Phone: [phone]; Fax: [phone]

## 2020-03-25 NOTE — Addendum Note (Signed)
Addended by: Marcelino Freestone on: 03/25/2020 12:28 PM   Modules accepted: Orders

## 2020-03-25 NOTE — Progress Notes (Signed)
Subjective:    Patient ID: [name] [name], female   DOB: 06-23-1966, 54 y.o.   MRN: [id number]   HPI   Rolled off the bed about 1.5 weeks ago.  Landed on footstool next to her bed--plastic with rubber top-hitting her left lower anterior chest.  States did not have much pain at the time.  The next morning, when she got up, she noted pain with deep breathing.  Took a nitroglycerin as she was having problems swallowing with her history of esophageal issues.  Sounds like she also took unknown medication she uses for gas and her problems swallowing resolved. States the pain gradually worsened--taking Tylenol and Tramadol.  Still with some difficulty taking a deep breath. Xray ordered by Dr. Irish Lack, cardiology, who she saw earlier today.  Reading not back yet.   No cough.  Normal breathing excursion fine--no pain.  Just hurts everywhere--around to back , etc.  +   Current Meds  Medication Sig  . Accu-Chek FastClix Lancets MISC CHECK BLOOD SUGAR 5 TIMES DAILY  . ACCU-CHEK GUIDE test strip USE AS DIRECTED CHECK BLOOD SUGAR 5 TIMES A DAY  . acetaminophen (TYLENOL) 500 MG tablet Take 500-1,000 mg by mouth every 6 (six) hours as needed for moderate pain.   Marland Kitchen albuterol (PROVENTIL HFA;VENTOLIN HFA) 108 (90 Base) MCG/ACT inhaler Inhale 2 puffs into the lungs every 6 (six) hours as needed for wheezing or shortness of breath.  Marland Kitchen ammonium lactate (AMLACTIN) 12 % cream Apply topically as needed for dry skin.  . Blood Glucose Monitoring Suppl (ACCU-CHEK GUIDE ME) w/Device KIT USE TO MONITOR BLOOD SUGAR FIVE TIMES A DAY DX  E10.22 IN VITRO  . Cholecalciferol 25 MCG (1000 UT) CHEW 1 chew by mouth daily  . clopidogrel (PLAVIX) 75 MG tablet Take 1 tablet (75 mg total) by mouth daily.  . cyclobenzaprine (FLEXERIL) 10 MG tablet Take 1-2 tablets by mouth at bedtime as needed. Muscle spasms  . diphenhydrAMINE (BENADRYL) 25 MG tablet Take 25 mg by mouth every 6 (six) hours as needed.  . furosemide (LASIX) 40 MG tablet  TAKE 1 TABLET BY MOUTH EVERY DAY IN THE MORNING  . gabapentin (NEURONTIN) 100 MG capsule TAKE 1 CAPSULE BY MOUTH AT BEDTIME.  . Incontinence Supply Disposable (POISE ULTRA THINS) PADS Use 5 pads daily as needed for urinary stress incontinence  . insulin aspart (NOVOLOG) 100 UNIT/ML injection Inject 4-16 Units into the skin 3 (three) times daily with meals. Sliding scale CBG 100-150; 4 units, 151-200; 6 units, 201-250; 8 units, 251-300; 10 units, 301-350; 12 units, 351-400; 14 units, > 401; call doctor  . LANTUS SOLOSTAR 100 UNIT/ML Solostar Pen Inject 10 Units into the skin 2 (two) times daily.   . methotrexate (RHEUMATREX) 2.5 MG tablet Take 10 mg by mouth once a week.   . metoprolol tartrate (LOPRESSOR) 25 MG tablet TAKE 1 TABLET BY MOUTH TWICE A DAY  . nitroGLYCERIN (NITROSTAT) 0.4 MG SL tablet Place 1 tablet (0.4 mg total) under the tongue every 5 (five) minutes as needed for chest pain. Please keep upcoming appt in August for future refills. Thank you  . ondansetron (ZOFRAN) 4 MG tablet TAKE 1 TABLET (4 MG TOTAL) BY MOUTH EVERY 6 (SIX) HOURS.  Marland Kitchen pantoprazole (PROTONIX) 40 MG tablet TAKE 1 TABLET (40 MG TOTAL) BY MOUTH 2 (TWO) TIMES DAILY BEFORE A MEAL FOR 30 DAYS, THEN 1 TABLET (40 MG TOTAL) DAILY BEFORE BREAKFAST.  Marland Kitchen prednisoLONE acetate (PRED FORTE) 1 % ophthalmic suspension Place 4-5  drops into the left eye daily.  . rosuvastatin (CRESTOR) 40 MG tablet Take 1 tablet (40 mg total) by mouth daily. Please keep upcoming appt in August before anymore refills. Thank you  . sertraline (ZOLOFT) 100 MG tablet 1 1/2 TABS BY MOUTH DAILY  . traMADol (ULTRAM) 50 MG tablet TAKE 1 TABLET EVERY 6 HOURS AS NEEDED FOR MODERATE PAIN  . triamcinolone cream (KENALOG) 0.1 % APPLY ON THE SKIN TWICE A DAY X2 WEEKS, TAKE 2 WEEKS OFF THEN REPEAT   Allergies  Allergen Reactions  . Aspirin Other (See Comments)    Stomach bleeds Stomach bleeds GI bleeding Other reaction(s): Other (See Comments) GI  bleeding Stomach bleeds      Review of Systems    Objective:   BP (!) 92/58 (BP Location: Left Arm, Patient Position: Sitting, Cuff Size: Normal)   Pulse 70   Resp 12   Ht '5\' 1"'  (1.549 m)   Wt 148 lb (67.1 kg)   LMP 01/12/2015   BMI 27.96 kg/m   Physical Exam  Lungs:  CTA Chest:  No bruising.  No crepitation or drop offs with palpation of ribs CV:  RRR without murmur or rub.    Assessment & Plan   Chest wall pain:  Splint deep breathing with pillows.  Tylenol 650 mg every 4-6 hours as needed for pain.  CBC, CMP for long term use of chronic meds

## 2020-03-25 NOTE — Patient Instructions (Signed)
Every hour, spend 5-10 minutes sitting upright on sofa with pillow splinted around your left chest and take slow deep breaths in and out.   Tylenol 650 mg every 4-6 hours as needed for pain.

## 2020-03-25 NOTE — Patient Instructions (Signed)
Medication Instructions:  Your physician recommends that you continue on your current medications as directed. Please refer to the Current Medication list given to you today.  *If you need a refill on your cardiac medications before your next appointment, please call your pharmacy*   Lab Work: None  If you have labs (blood work) drawn today and your tests are completely normal, you will receive your results only by: Marland Kitchen MyChart Message (if you have MyChart) OR . A paper copy in the mail If you have any lab test that is abnormal or we need to change your treatment, we will call you to review the results.   Testing/Procedures: A chest x-ray takes a picture of the organs and structures inside the chest, including the heart, lungs, and blood vessels. This test can show several things, including, whether the heart is enlarges; whether fluid is building up in the lungs; and whether pacemaker / defibrillator leads are still in place.  Chest X-ray Instructions:    1. You may have this done at the [facility], located in the        [facility] Building on the 1st floor.    2. You do no have to have an appointment.    3. [address] [place], [place] [address]        [phone]        Monday - Friday  8:00 am - 5:00 pm    Follow-Up: At [facility], you and your health needs are our priority.  As part of our continuing mission to provide you with exceptional heart care, we have created designated Provider Care Teams.  These Care Teams include your primary Cardiologist (physician) and Advanced Practice Providers (APPs -  Physician Assistants and Nurse Practitioners) who all work together to provide you with the care you need, when you need it.  We recommend signing up for the patient portal called "MyChart".  Sign up information is provided on this After Visit Summary.  MyChart is used to connect with patients for Virtual Visits (Telemedicine).  Patients are able  to view lab/test results, encounter notes, upcoming appointments, etc.  Non-urgent messages can be sent to your provider as well.   To learn more about what you can do with MyChart, go to [email or web address].    Your next appointment:   6 month(s)  The format for your next appointment:   In Person  Provider:   You may see Dr. Eldridge Dace or one of the following Advanced Practice Providers on your designated Care Team:    Ronie Spies, PA-C  Jacolyn Reedy, PA-C    Other Instructions None

## 2020-03-26 LAB — COMPREHENSIVE METABOLIC PANEL
ALT: 8 IU/L (ref 0–32)
AST: 21 IU/L (ref 0–40)
Albumin/Globulin Ratio: 1.3 (ref 1.2–2.2)
Albumin: 4.5 g/dL (ref 3.8–4.9)
Alkaline Phosphatase: 160 IU/L — ABNORMAL HIGH (ref 48–121)
BUN/Creatinine Ratio: 21 (ref 9–23)
BUN: 29 mg/dL — ABNORMAL HIGH (ref 6–24)
Bilirubin Total: 0.5 mg/dL (ref 0.0–1.2)
CO2: 22 mmol/L (ref 20–29)
Calcium: 9.9 mg/dL (ref 8.7–10.2)
Chloride: 104 mmol/L (ref 96–106)
Creatinine, Ser: 1.37 mg/dL — ABNORMAL HIGH (ref 0.57–1.00)
GFR calc Af Amer: 51 mL/min/{1.73_m2} — ABNORMAL LOW (ref >59)
GFR calc non Af Amer: 44 mL/min/{1.73_m2} — ABNORMAL LOW (ref >59)
Globulin, Total: 3.4 g/dL (ref 1.5–4.5)
Glucose: 215 mg/dL — ABNORMAL HIGH (ref 65–99)
Potassium: 4.4 mmol/L (ref 3.5–5.2)
Sodium: 142 mmol/L (ref 134–144)
Total Protein: 7.9 g/dL (ref 6.0–8.5)

## 2020-03-26 LAB — CBC WITH DIFFERENTIAL/PLATELET
Basophils Absolute: 0 10*3/uL (ref 0.0–0.2)
Basos: 0 %
EOS (ABSOLUTE): 0.1 10*3/uL (ref 0.0–0.4)
Eos: 2 %
Hematocrit: 38.2 % (ref 34.0–46.6)
Hemoglobin: 13 g/dL (ref 11.1–15.9)
Immature Grans (Abs): 0 10*3/uL (ref 0.0–0.1)
Immature Granulocytes: 0 %
Lymphocytes Absolute: 1.7 10*3/uL (ref 0.7–3.1)
Lymphs: 25 %
MCH: 31.3 pg (ref 26.6–33.0)
MCHC: 34 g/dL (ref 31.5–35.7)
MCV: 92 fL (ref 79–97)
Monocytes Absolute: 0.5 10*3/uL (ref 0.1–0.9)
Monocytes: 8 %
Neutrophils Absolute: 4.4 10*3/uL (ref 1.4–7.0)
Neutrophils: 65 %
Platelets: 287 10*3/uL (ref 150–450)
RBC: 4.15 x10E6/uL (ref 3.77–5.28)
RDW: 14.6 % (ref 11.7–15.4)
WBC: 6.8 10*3/uL (ref 3.4–10.8)

## 2020-03-29 ENCOUNTER — Telehealth: Payer: Self-pay | Admitting: Internal Medicine

## 2020-03-29 NOTE — Telephone Encounter (Signed)
Patient called requesting a call back from [facility]. Patient stated has some questions on folic acid medication.

## 2020-03-30 NOTE — Telephone Encounter (Signed)
Spoke with patient. Gave patient the dose of folic acid we had in chart and how she is supposed to be taking it. States she will get more today

## 2020-04-01 ENCOUNTER — Telehealth: Payer: Medicaid Other | Admitting: Clinical

## 2020-04-02 ENCOUNTER — Telehealth (INDEPENDENT_AMBULATORY_CARE_PROVIDER_SITE_OTHER): Payer: Medicaid Other | Admitting: Clinical

## 2020-04-02 DIAGNOSIS — F609 Personality disorder, unspecified: Secondary | ICD-10-CM | POA: Diagnosis not present
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
## 2020-04-10 ENCOUNTER — Other Ambulatory Visit: Payer: Self-pay | Admitting: Internal Medicine

## 2020-04-10 ENCOUNTER — Other Ambulatory Visit: Payer: Self-pay | Admitting: Interventional Cardiology

## 2020-04-10 DIAGNOSIS — I25119 Atherosclerotic heart disease of native coronary artery with unspecified angina pectoris: Secondary | ICD-10-CM

## 2020-04-16 ENCOUNTER — Telehealth (INDEPENDENT_AMBULATORY_CARE_PROVIDER_SITE_OTHER): Payer: Medicaid Other | Admitting: Clinical

## 2020-04-16 ENCOUNTER — Telehealth: Payer: Medicaid Other | Admitting: Clinical

## 2020-04-16 DIAGNOSIS — F609 Personality disorder, unspecified: Secondary | ICD-10-CM | POA: Diagnosis not present

## 2020-04-19 ENCOUNTER — Other Ambulatory Visit: Payer: Self-pay | Admitting: Internal Medicine

## 2020-04-20 ENCOUNTER — Ambulatory Visit (INDEPENDENT_AMBULATORY_CARE_PROVIDER_SITE_OTHER): Payer: Medicaid Other | Admitting: Internal Medicine

## 2020-04-20 ENCOUNTER — Other Ambulatory Visit: Payer: Self-pay

## 2020-04-20 ENCOUNTER — Ambulatory Visit
Admission: RE | Admit: 2020-04-20 | Discharge: 2020-04-20 | Disposition: A | Discharge status: Home / Self Care | Payer: Medicaid Other | Source: Ambulatory Visit | Attending: Internal Medicine | Admitting: Internal Medicine

## 2020-04-20 ENCOUNTER — Encounter: Payer: Self-pay | Admitting: Internal Medicine

## 2020-04-20 VITALS — BP 122/72 | HR 78 | Resp 12 | Ht 61.0 in | Wt 149.0 lb

## 2020-04-20 DIAGNOSIS — E118 Type 2 diabetes mellitus with unspecified complications: Secondary | ICD-10-CM

## 2020-04-20 DIAGNOSIS — M25532 Pain in left wrist: Secondary | ICD-10-CM | POA: Diagnosis not present

## 2020-04-20 DIAGNOSIS — R454 Irritability and anger: Secondary | ICD-10-CM

## 2020-04-20 NOTE — Patient Instructions (Addendum)
RICE Therapy for Routine Care of Injuries Many injuries can be cared for with rest, ice, compression, and elevation (RICE therapy). This includes:  Resting the injured part.  Putting ice on the injury.  Putting pressure (compression) on the injury.  Raising the injured part (elevation). Using RICE therapy can help to lessen pain and swelling. Supplies needed:  Ice.  Plastic bag.  Towel.  Elastic bandage.  Pillow or pillows to raise (elevate) your injured body part. How to care for your injury with RICE therapy Rest Limit your normal activities, and try not to use the injured part of your body. You can go back to your normal activities when your doctor says it is okay to do them and you feel okay. Ask your doctor if you should do exercises to help your injury get better. Ice Put ice on the injured area. Do not put ice on your bare skin.  Put ice in a plastic bag.  Place a towel between your skin and the bag.  Leave the ice on for 20 minutes, 2-3 times a day. Use ice on as many days as told by your doctor.  Compression Compression means putting pressure on the injured area. This can be done with an elastic bandage. If an elastic bandage has been put on your injury:  Do not wrap the bandage too tight. Wrap the bandage more loosely if part of your body away from the bandage is blue, swollen, cold, painful, or loses feeling (gets numb).  Take off the bandage and put it on again. Do this every 3-4 hours or as told by your doctor.  See your doctor if the bandage seems to make your problems worse.  Elevation Elevation means keeping the injured area raised. If you can, raise the injured area above your heart or the center of your chest. Contact a doctor if:  You keep having pain and swelling.  Your symptoms get worse. Get help right away if:  You have sudden bad pain at your injury or lower than your injury.  You have redness or more swelling around your injury.  You  have tingling or numbness at your injury or lower than your injury, and it does not go away when you take off the bandage. Summary  Many injuries can be cared for using rest, ice, compression, and elevation (RICE therapy).  You can go back to your normal activities when you feel okay and your doctor says it is okay.  Put ice on the injured area as told by your doctor.  Get help if your symptoms get worse or if you keep having pain and swelling. This information is not intended to replace advice given to you by your health care provider. Make sure you discuss any questions you have with your health care provider. Document Revised: 04/06/2017 Document Reviewed: 04/06/2017 Elsevier Patient Education  2020 [employer].   [place], [place], will be calling to see about setting you up with future therapy for anger management among other things
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
## 2020-04-22 DIAGNOSIS — S52502A Unspecified fracture of the lower end of left radius, initial encounter for closed fracture: Secondary | ICD-10-CM | POA: Insufficient documentation

## 2020-04-22 HISTORY — DX: Unspecified fracture of the lower end of left radius, initial encounter for closed fracture: S52.502A
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
## 2020-05-06 ENCOUNTER — Other Ambulatory Visit: Payer: Self-pay | Admitting: Internal Medicine

## 2020-05-07 ENCOUNTER — Telehealth: Payer: Medicaid Other | Admitting: Clinical

## 2020-05-07 ENCOUNTER — Encounter: Payer: Self-pay | Admitting: Clinical
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
## 2020-05-12 ENCOUNTER — Telehealth (INDEPENDENT_AMBULATORY_CARE_PROVIDER_SITE_OTHER): Payer: Medicaid Other | Admitting: Clinical

## 2020-05-12 DIAGNOSIS — F609 Personality disorder, unspecified: Secondary | ICD-10-CM | POA: Diagnosis not present

## 2020-05-12 DIAGNOSIS — Z716 Tobacco abuse counseling: Secondary | ICD-10-CM

## 2020-05-17 ENCOUNTER — Other Ambulatory Visit: Payer: Medicaid Other

## 2020-05-17 DIAGNOSIS — E559 Vitamin D deficiency, unspecified: Secondary | ICD-10-CM

## 2020-05-18 ENCOUNTER — Other Ambulatory Visit: Payer: Self-pay | Admitting: Internal Medicine

## 2020-05-18 DIAGNOSIS — I10 Essential (primary) hypertension: Secondary | ICD-10-CM

## 2020-05-18 LAB — VITAMIN D 25 HYDROXY (VIT D DEFICIENCY, FRACTURES): Vit D, 25-Hydroxy: 24.6 ng/mL — ABNORMAL LOW (ref 30.0–100.0)
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
## 2020-05-19 ENCOUNTER — Other Ambulatory Visit: Payer: Self-pay

## 2020-05-24 ENCOUNTER — Other Ambulatory Visit: Payer: Self-pay | Admitting: Internal Medicine

## 2020-05-26 ENCOUNTER — Telehealth (INDEPENDENT_AMBULATORY_CARE_PROVIDER_SITE_OTHER): Payer: Medicaid Other | Admitting: Clinical

## 2020-05-26 DIAGNOSIS — F609 Personality disorder, unspecified: Secondary | ICD-10-CM

## 2020-06-02 ENCOUNTER — Telehealth: Payer: Self-pay | Admitting: Internal Medicine

## 2020-06-02 NOTE — Telephone Encounter (Signed)
Patient called asking if it's normal to still have symptoms from the booster shot from Monday. She's currently having nausea, body ache and her blood sugar is going up and down.   Please advise.

## 2020-06-02 NOTE — Progress Notes (Signed)
   THERAPY PROGRESS NOTE  Session Time: 12:00pm-1:00p Participation Level: Active Behavioral Response: CasualAlertEuthymic Type of Therapy: Individual Therapy Treatment Goals addressed: Anger and Coping   Purpose: LCSW met with client for routine individual therapy to work towards treatment goals: coping skills to manage anger and decreasing tobacco use.   Intervention: LCSW met with client for routine individual therapy to work towards treatment goals: coping skills to manage anger and decreasing tobacco use. LCSW provided patient time to do a brief check in and review. LCSW utilized intervention of DBT skills within a CBT setting to discuss skill mentioned by patient of opposite action (will discuss more in next session) to manage anger and setting boundaries. LCSW and patient discussed addiction and recovery. LCSW assessed for SI/HI/command psychosis.  Effectiveness: Patient is alert x4 affect. Patient identified she is working on tobacco use as she learned her nurse is pregnant and this helping not smoke in the home. From Mon-Fri of last week she is still on the same pack which has lasted her 3 days. Reports she looks forward to her nurse in the mornings. Also was with a friend on Sunday and this person doesn't smoke so she found herself not smoking on that day.   Patient shared her friend (T) is back in her life. Patient reports he said some things that would've made her angry but instead she did opposite action of laughing and this helped her manage her anger as before she would've done something else.   Patient shared how T friend is back. LCSW asked what this would mean for her. Patient shared she told T she is only letting him back as a friend, shared she discussed boundaries to him. Patient shared she cares for T and especially as a recovering addict she can understand he's relapses and need for help. LCSW provided patient SAIOP information for her friend T. LCSW praised patient for  expressing her boundaries to T. Patient shared her warning signs for relapse and awarness for maintaining her recovery.   Intervention was effective as patient shared she is beginning to think and look at things from an approach helping her with her decision making. Patient shared affirmation for motivation "achievement to do better and will do better"     Progress towards goal is Ongoing. Patient denied active suicidal/homicidal/active psychosis.  Plan Patient offered next appointment for: 11/10 12pm via virtual   Diagnosis: unspecified personality disorder    [name], LCSW 06/02/2020

## 2020-06-08 ENCOUNTER — Ambulatory Visit: Payer: Medicaid Other | Admitting: Podiatry

## 2020-06-09 ENCOUNTER — Telehealth: Payer: Medicaid Other | Admitting: Clinical

## 2020-06-09 NOTE — Telephone Encounter (Signed)
Pt. Notified and states she is feeling better now . Her only symptom now is her arm still sore. Pt. Advised to call if any problems
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
## 2020-06-16 ENCOUNTER — Telehealth: Payer: Medicaid Other | Admitting: Clinical

## 2020-06-16 ENCOUNTER — Telehealth (INDEPENDENT_AMBULATORY_CARE_PROVIDER_SITE_OTHER): Payer: Medicaid Other | Admitting: Clinical

## 2020-06-16 DIAGNOSIS — Z716 Tobacco abuse counseling: Secondary | ICD-10-CM

## 2020-06-16 DIAGNOSIS — F609 Personality disorder, unspecified: Secondary | ICD-10-CM
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
## 2020-06-30 ENCOUNTER — Telehealth: Payer: Medicaid Other | Admitting: Clinical

## 2020-07-01 ENCOUNTER — Telehealth (INDEPENDENT_AMBULATORY_CARE_PROVIDER_SITE_OTHER): Payer: Medicaid Other | Admitting: Clinical

## 2020-07-01 DIAGNOSIS — F609 Personality disorder, unspecified: Secondary | ICD-10-CM | POA: Diagnosis not present
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
## 2020-07-10 ENCOUNTER — Emergency Department (HOSPITAL_COMMUNITY)
Admission: EM | Admit: 2020-07-10 | Discharge: 2020-07-10 | Disposition: A | Discharge status: Home / Self Care | Payer: Medicaid Other | Attending: Emergency Medicine | Admitting: Emergency Medicine

## 2020-07-10 ENCOUNTER — Encounter (HOSPITAL_COMMUNITY): Payer: Self-pay

## 2020-07-10 ENCOUNTER — Other Ambulatory Visit: Payer: Self-pay

## 2020-07-10 DIAGNOSIS — E1122 Type 2 diabetes mellitus with diabetic chronic kidney disease: Secondary | ICD-10-CM | POA: Diagnosis not present

## 2020-07-10 DIAGNOSIS — I130 Hypertensive heart and chronic kidney disease with heart failure and stage 1 through stage 4 chronic kidney disease, or unspecified chronic kidney disease: Secondary | ICD-10-CM | POA: Diagnosis not present

## 2020-07-10 DIAGNOSIS — E11649 Type 2 diabetes mellitus with hypoglycemia without coma: Secondary | ICD-10-CM | POA: Diagnosis not present

## 2020-07-10 DIAGNOSIS — E1143 Type 2 diabetes mellitus with diabetic autonomic (poly)neuropathy: Secondary | ICD-10-CM | POA: Diagnosis not present

## 2020-07-10 DIAGNOSIS — Z8673 Personal history of transient ischemic attack (TIA), and cerebral infarction without residual deficits: Secondary | ICD-10-CM | POA: Insufficient documentation

## 2020-07-10 DIAGNOSIS — E11621 Type 2 diabetes mellitus with foot ulcer: Secondary | ICD-10-CM | POA: Insufficient documentation

## 2020-07-10 DIAGNOSIS — I5022 Chronic systolic (congestive) heart failure: Secondary | ICD-10-CM | POA: Diagnosis not present

## 2020-07-10 DIAGNOSIS — E162 Hypoglycemia, unspecified: Secondary | ICD-10-CM | POA: Diagnosis present

## 2020-07-10 DIAGNOSIS — Z79899 Other long term (current) drug therapy: Secondary | ICD-10-CM | POA: Diagnosis not present

## 2020-07-10 DIAGNOSIS — L97421 Non-pressure chronic ulcer of left heel and midfoot limited to breakdown of skin: Secondary | ICD-10-CM | POA: Insufficient documentation

## 2020-07-10 DIAGNOSIS — E11319 Type 2 diabetes mellitus with unspecified diabetic retinopathy without macular edema: Secondary | ICD-10-CM | POA: Insufficient documentation

## 2020-07-10 DIAGNOSIS — Z794 Long term (current) use of insulin: Secondary | ICD-10-CM | POA: Diagnosis not present

## 2020-07-10 DIAGNOSIS — E1140 Type 2 diabetes mellitus with diabetic neuropathy, unspecified: Secondary | ICD-10-CM | POA: Diagnosis not present

## 2020-07-10 DIAGNOSIS — F17210 Nicotine dependence, cigarettes, uncomplicated: Secondary | ICD-10-CM | POA: Diagnosis not present

## 2020-07-10 DIAGNOSIS — N183 Chronic kidney disease, stage 3 unspecified: Secondary | ICD-10-CM | POA: Diagnosis not present

## 2020-07-10 DIAGNOSIS — K3184 Gastroparesis: Secondary | ICD-10-CM | POA: Diagnosis not present

## 2020-07-10 DIAGNOSIS — I2510 Atherosclerotic heart disease of native coronary artery without angina pectoris: Secondary | ICD-10-CM | POA: Diagnosis not present

## 2020-07-10 LAB — CBG MONITORING, ED
Glucose-Capillary: 160 mg/dL — ABNORMAL HIGH (ref 70–99)
Glucose-Capillary: 61 mg/dL — ABNORMAL LOW (ref 70–99)
Glucose-Capillary: 170 mg/dL — ABNORMAL HIGH (ref 70–99)

## 2020-07-10 LAB — CBC
HCT: 41.4 % (ref 36.0–46.0)
Hemoglobin: 12.9 g/dL (ref 12.0–15.0)
MCH: 29.2 pg (ref 26.0–34.0)
MCHC: 31.2 g/dL (ref 30.0–36.0)
MCV: 93.7 fL (ref 80.0–100.0)
Platelets: 278 10*3/uL (ref 150–400)
RBC: 4.42 MIL/uL (ref 3.87–5.11)
RDW: 16.8 % — ABNORMAL HIGH (ref 11.5–15.5)
WBC: 18.4 10*3/uL — ABNORMAL HIGH (ref 4.0–10.5)
nRBC: 0 % (ref 0.0–0.2)

## 2020-07-10 LAB — BASIC METABOLIC PANEL
Anion gap: 12 (ref 5–15)
BUN: 25 mg/dL — ABNORMAL HIGH (ref 6–20)
CO2: 18 mmol/L — ABNORMAL LOW (ref 22–32)
Calcium: 9.3 mg/dL (ref 8.9–10.3)
Chloride: 103 mmol/L (ref 98–111)
Creatinine, Ser: 1.15 mg/dL — ABNORMAL HIGH (ref 0.44–1.00)
GFR, Estimated: 57 mL/min — ABNORMAL LOW (ref >60)
Glucose, Bld: 173 mg/dL — ABNORMAL HIGH (ref 70–99)
Potassium: 3.7 mmol/L (ref 3.5–5.1)
Sodium: 133 mmol/L — ABNORMAL LOW (ref 135–145)

## 2020-07-10 NOTE — ED Triage Notes (Signed)
Patient arrives with [name] from home, EMS reports they were called out earlier in the day for same but she was able to eat and signed a refusal, EMS called by patient's boyfriend, found to be "low" on CBG, 1 mg Glucagon IM given, repeat CBG was 41 for EMS.

## 2020-07-10 NOTE — ED Provider Notes (Signed)
[facility] EMERGENCY DEPARTMENT Provider Note   CSN: [id number] Arrival date & time: 07/10/20  0035     History Chief Complaint  Patient presents with  . Hypoglycemia    [name] [name] is a 54 y.o. female.  Patient presents to the emergency department with a chief complaint of hypoglycemia.  She states that she was eating dinner tonight and became lightheaded and fell over.  This was the second time this occurred today.  It was witnessed by her significant other.  EMS was called, and patient's blood sugar was found to be 41.  She was given IM glucagon.  She denies any complaints now.  Denies any changes in her medications.  She denies any other associated symptoms.  The history is provided by the patient. No language interpreter was used.       Past Medical History:  Diagnosis Date  . Acute myocardial infarction of other lateral wall, initial episode of care   . Acute myocardial infarction, unspecified site, initial episode of care   . Acute osteomyelitis   . Allergic rhinitis   . Anemia   . Anginal pain ([place])    07/15/13- no chest pain in months"  . Anxiety   . Bipolar affective ([place])   . CAD (coronary artery disease) 07/2011   s/p DES mid and distal RCA with 50% LAD  . Daily headache    not daily  . Depression    Bipolar disorder  . Diabetic foot ulcer associated with diabetes mellitus due to underlying condition ([place]) 09/26/2018  . Diabetic gastroparesis associated with type 2 diabetes mellitus ([place])   . Diabetic peripheral neuropathy associated with type 2 diabetes mellitus ([place])   . Diabetic retinopathy   . Esophageal stenosis   . Esophageal ulcer   . Esophagitis   . Gastroparesis   . Genital herpes    Reportedly tested and documented by [facility]--rare occurrences  . GERD (gastroesophageal reflux disease)   . Heart murmur   . History of stomach ulcers   . Hyperlipidemia   . Hypertension   . Inferior MI ([place]) 01/20/2009   Archie Endo on  12/19/2012, "that's the only one I've had" (12/19/2012)  . Migraines   . Orthopnoea   . Pneumonia 2012  . Polysubstance abuse ([place])    Crack cocaine--none since 2008, MJ, ETOH:  clean of all since 2008  . Renal insufficiency   . Renal lithiasis    Bilateral  . Rheumatoid arthritis(714.0)   . Sebaceous cyst   . Stroke [facility]) 2011   denies residual on 12/19/2012.  "Years ago"  . Type II diabetes mellitus (HCC)    Previously uncontrolled for many years with multiple complications.  2017 controlled. 05/2016:  6.7%    Patient Active Problem List   Diagnosis Date Noted  . Vitamin D deficiency 02/16/2020  . Renal insufficiency   . Renal lithiasis   . Proliferative retinopathy due to type 1 diabetes mellitus ([place]) 09/03/2019  . Stress and adjustment reaction 09/03/2019  . Tinea cruris 05/13/2019  . Elevated alkaline phosphatase measurement 02/25/2019  . Furunculosis 02/25/2019  . Corneal edema 12/02/2018  . Iron deficiency 12/02/2018  . Neuropathy 12/02/2018  . Molluscum contagiosum 11/19/2018  . Diabetic ulcer of left heel associated with type 2 diabetes mellitus, limited to breakdown of skin ([place]) 11/19/2018  . Diabetic foot ulcer associated with diabetes mellitus due to underlying condition ([place]) 09/26/2018  . Chest pain 06/15/2018  . Bipolar disease, chronic ([place]) 06/15/2018  .  H/O esophageal ulcer 06/15/2018  . Acute pulmonary edema (HCC)   . 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 Diabetic gastroparesis associated with type 2 diabetes mellitus ([place])   . Diabetic peripheral neuropathy associated with type 2 diabetes mellitus ([place])   . Acute osteomyelitis of left foot ([place]) 02/15/2018  . Foot pain 02/15/2018  . Nausea and vomiting 02/15/2018  . Hypokalemia 02/15/2018  . Fibromyalgia 05/29/2017  . Insomnia 05/29/2017  . Shoulder injury 05/29/2017  . Closed displaced fracture of middle phalanx of right ring finger 05/26/2017  . Urinary, incontinence, stress female 10/14/2015  . Obesity  (BMI 30-39.9) 06/21/2015  . Acute renal failure syndrome ([place])   . CKD (chronic kidney disease), stage III ([place]) 02/21/2015  . Chronic systolic congestive heart failure, NYHA class 1 ([place]) 02/21/2015  . History of NSTEMI w/ DES to cfx May 2014 10/02/2014  . Nuclear cataract of right eye 09/17/2014  . Status post intraocular lens implant 09/17/2014  . 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 PCO (posterior capsular opacification) 11/26/2013  . Hyperlipidemia   . Tobacco abuse 10/03/2013  . Amblyopia 10/02/2013  . Corneal opacification 10/02/2013  . Retinal detachment, tractional, left eye 10/02/2013  . Patteson risk medication use 02/20/2013  . Diabetic gastroparesis ([place]) 01/16/2013  . Nausea vomiting and diarrhea 12/29/2012  . CAD (coronary artery disease) 08/25/2010  . Dehydration 06/17/2010  . Anemia 03/21/2010  . ALLERGIC RHINITIS 12/28/2009  . HTN (hypertension) 10/08/2009  . Rheumatoid arthritis ([place]) 02/18/2009  . Diabetic retinopathy ([place]) 08/13/2007  . GERD 03/27/2007  . RENAL INSUFFICIENCY, CHRONIC 03/27/2007  . Anxiety state 03/25/2007  . DEPRESSION 03/25/2007  . ULCER, ESOPHAGUS WITHOUT BLEEDING 07/06/2006  . ESOPHAGEAL STENOSIS 07/06/2006  . Diabetes mellitus with multiple complications ([place]) 28/41/3244    Past Surgical History:  Procedure Laterality Date  . CORONARY ANGIOPLASTY WITH STENT PLACEMENT  01/20/2009   "2" (12/19/2012)  . CORONARY ANGIOPLASTY WITH STENT PLACEMENT  2012   "2" (12/19/2012)  . CORONARY ANGIOPLASTY WITH STENT PLACEMENT  12/19/2012   "2" (12/19/2012)  . ESOPHAGOGASTRODUODENOSCOPY N/A 02/26/2015   Procedure: ESOPHAGOGASTRODUODENOSCOPY (EGD);  Surgeon: Teena Irani, MD;  Location: [facility] ENDOSCOPY;  Service: Endoscopy;  Laterality: N/A;  . EYE SURGERY     Multiple surgeries of both eyes:  last laser was 09/08/2015 of right eye.  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 GAS INSERTION Left 07/16/2013   Procedure: INSERTION OF GAS;   Surgeon: Adonis Brook, MD;  Location: [facility];  Service: Ophthalmology;  Laterality: Left;  SF6  . GAS/FLUID EXCHANGE Left 07/30/2013   Procedure: GAS/FLUID EXCHANGE;  Surgeon: Adonis Brook, MD;  Location: [facility];  Service: Ophthalmology;  Laterality: Left;  . IRRIGATION AND DEBRIDEMENT SEBACEOUS CYST Right 03/2011   "pointer" (12/19/2012)  . LEFT HEART CATHETERIZATION WITH CORONARY ANGIOGRAM N/A 07/06/2011   Procedure: LEFT HEART CATHETERIZATION WITH CORONARY ANGIOGRAM;  Surgeon: Jettie Booze, MD;  Location: [facility] CATH LAB;  Service: Cardiovascular;  Laterality: N/A;  possible PCI  . LEFT HEART CATHETERIZATION WITH CORONARY ANGIOGRAM N/A 12/19/2012   Procedure: LEFT HEART CATHETERIZATION WITH CORONARY ANGIOGRAM;  Surgeon: Jettie Booze, MD;  Location: [facility] CATH LAB;  Service: Cardiovascular;  Laterality: N/A;  . MEMBRANE PEEL Left 07/16/2013   Procedure: MEMBRANE PEEL;  Surgeon: Adonis Brook, MD;  Location: [facility];  Service: Ophthalmology;  Laterality: Left;  . PARS PLANA VITRECTOMY Left 07/16/2013   Procedure: PARS PLANA VITRECTOMY WITH 23 GAUGE;  Surgeon: Adonis Brook, MD;  Location: [facility];  Service: Ophthalmology;  Laterality: Left;  . PARS PLANA VITRECTOMY Left 07/30/2013   Procedure: PARS PLANA VITRECTOMY WITH 23 GAUGE WITH ENDOLASER;  Surgeon: Adonis Brook, MD;  Location: [facility];  Service: Ophthalmology;  Laterality: Left;  with endolaser  . PERCUTANEOUS CORONARY STENT INTERVENTION (PCI-S) N/A 07/06/2011   Procedure: PERCUTANEOUS CORONARY STENT INTERVENTION (PCI-S);  Surgeon: Jettie Booze, MD;  Location: [facility] CATH LAB;  Service: Cardiovascular;  Laterality: N/A;  . PHOTOCOAGULATION WITH LASER Left 07/16/2013   Procedure: PHOTOCOAGULATION WITH LASER;  Surgeon: Adonis Brook, MD;  Location: [facility];  Service: Ophthalmology;  Laterality: Left;  ENDOLASER     OB History   No obstetric history on file.     Family History  Problem Relation Age of Onset  . Breast cancer Mother 72  .  Hypertension Father   . Stomach cancer Sister 104       cause of death  . Hypertension Sister   . Lung cancer Sister 21       Cause of death  . Cancer Sister 54       ? stomach cancer  . Colon cancer Neg Hx     Social History   Tobacco Use  . Smoking status: Current Every Day Smoker    Packs/day: 0.33    Years: 22.00    Pack years: 7.26    Types: Cigarettes  . Smokeless tobacco: Never Used  . Tobacco comment: Has been to classes--not sure if she wants to quit.  Changing to non menthol..  Chantix, patches, gum never helped.  9/19:  lot going on.  Vaping Use  . Vaping Use: Never used  Substance Use Topics  . Alcohol use: Yes    Alcohol/week: 0.0 standard drinks    Comment: Extremely rare--once yearly or less.  . Drug use: No    Types: "Crack" cocaine, Marijuana    Comment: 12/31/2012 " clean from crack.  Does still smoke marijuana     Home Medications Prior to Admission medications   Medication Sig Start Date End Date Taking? Authorizing Provider  Accu-Chek FastClix Lancets MISC CHECK BLOOD SUGAR 5 TIMES DAILY 12/24/18   [provider]  ACCU-CHEK GUIDE test strip USE AS DIRECTED CHECK BLOOD SUGAR 5 TIMES A DAY 12/23/18   [provider]  acetaminophen (TYLENOL) 500 MG tablet Take 500-1,000 mg by mouth every 6 (six) hours as needed for moderate pain.     [provider]  albuterol (PROVENTIL HFA;VENTOLIN HFA) 108 (90 Base) MCG/ACT inhaler Inhale 2 puffs into the lungs every 6 (six) hours as needed for wheezing or shortness of breath. 12/01/16   Mack Hook, MD  ammonium lactate (AMLACTIN) 12 % cream Apply topically as needed for dry skin. 09/15/19   Trula Slade, DPM  Blood Glucose Monitoring Suppl (ACCU-CHEK GUIDE ME) w/Device KIT USE TO MONITOR BLOOD SUGAR FIVE TIMES A DAY DX  E10.22 IN VITRO 12/26/18   [provider]  Cholecalciferol (VITAMIN D3) 25 MCG (1000 UT) CHEW CHEW 1 TABLET DAILY 04/19/20   Mack Hook, MD   clopidogrel (PLAVIX) 75 MG tablet Take 1 tablet (75 mg total) by mouth daily. 09/03/19   Mack Hook, MD  cyclobenzaprine (FLEXERIL) 10 MG tablet Take 1-2 tablets by mouth at bedtime as needed. Muscle spasms 09/21/16   [provider]  diphenhydrAMINE (BENADRYL) 25 MG tablet Take 25 mg by mouth every 6 (six) hours as needed.    [provider]  folic acid (FOLVITE) 938 MCG tablet Take 400  mcg by mouth daily. 3 tabs by mouth daily     [provider]  furosemide (LASIX) 40 MG tablet TAKE 1 TABLET BY MOUTH EVERY DAY IN THE MORNING 05/20/20   Mack Hook, MD  gabapentin (NEURONTIN) 100 MG capsule TAKE 1 CAPSULE BY MOUTH AT BEDTIME. 10/13/19   Trula Slade, DPM  Incontinence Supply Disposable (POISE ULTRA THINS) PADS Use 5 pads daily as needed for urinary stress incontinence 01/03/16   Mack Hook, MD  insulin aspart (NOVOLOG) 100 UNIT/ML injection Inject 4-16 Units into the skin 3 (three) times daily with meals. Sliding scale CBG 100-150; 4 units, 151-200; 6 units, 201-250; 8 units, 251-300; 10 units, 301-350; 12 units, 351-400; 14 units, > 401; call doctor    [provider]  LANTUS SOLOSTAR 100 UNIT/ML Solostar Pen Inject 10 Units into the skin 2 (two) times daily.  01/15/18   [provider]  methotrexate (RHEUMATREX) 2.5 MG tablet Take 10 mg by mouth once a week.  04/21/19   [provider]  metoprolol tartrate (LOPRESSOR) 25 MG tablet TAKE 1 TABLET BY MOUTH TWICE A DAY 05/27/20   Mack Hook, MD  nitroGLYCERIN (NITROSTAT) 0.4 MG SL tablet Place 1 tablet (0.4 mg total) under the tongue every 5 (five) minutes as needed for chest pain. Max 3 doses, call 911. 04/12/20   Jettie Booze, MD  ondansetron (ZOFRAN) 4 MG tablet TAKE 1 TABLET (4 MG TOTAL) BY MOUTH EVERY 6 (SIX) HOURS. 04/12/20   Mack Hook, MD  pantoprazole (PROTONIX) 40 MG tablet TAKE 1 TABLET (40 MG TOTAL) BY MOUTH 2 (TWO) TIMES DAILY BEFORE A  MEAL FOR 30 DAYS, THEN 1 TABLET (40 MG TOTAL) DAILY BEFORE BREAKFAST. 12/01/19   Mack Hook, MD  prednisoLONE acetate (PRED FORTE) 1 % ophthalmic suspension Place 4-5 drops into the left eye daily. 04/22/17   [provider]  rosuvastatin (CRESTOR) 40 MG tablet Take 1 tablet (40 mg total) by mouth daily. 04/12/20   Jettie Booze, MD  sertraline (ZOLOFT) 100 MG tablet 1 1/2 TABS BY MOUTH DAILY 05/07/20   Mack Hook, MD  traMADol (ULTRAM) 50 MG tablet TAKE 1 TABLET EVERY 6 HOURS AS NEEDED FOR MODERATE PAIN 03/06/17   Mack Hook, MD  triamcinolone cream (KENALOG) 0.1 % APPLY ON THE SKIN TWICE A DAY X2 WEEKS, TAKE 2 WEEKS OFF THEN REPEAT 01/21/19   [provider]    Allergies    Aspirin  Review of Systems   Review of Systems  All other systems reviewed and are negative.   Physical Exam Updated Vital Signs BP 114/62   Pulse 88   Temp 98.2 F (36.8 C) (Oral)   Resp 16   Ht 5' (1.524 m)   Wt 68 kg   LMP 01/12/2015   SpO2 99%   BMI 29.29 kg/m   Physical Exam Vitals and nursing note reviewed.  Constitutional:      General: She is not in acute distress.    Appearance: She is well-developed and well-nourished.  HENT:     Head: Normocephalic and atraumatic.  Eyes:     Conjunctiva/sclera: Conjunctivae normal.  Cardiovascular:     Rate and Rhythm: Normal rate and regular rhythm.     Heart sounds: No murmur heard.   Pulmonary:     Effort: Pulmonary effort is normal. No respiratory distress.     Breath sounds: Normal breath sounds.  Abdominal:     Palpations: Abdomen is soft.     Tenderness: There  is no abdominal tenderness.  Musculoskeletal:        General: No edema. Normal range of motion.     Cervical back: Neck supple.  Skin:    General: Skin is warm and dry.  Neurological:     Mental Status: She is alert and oriented to person, place, and time.  Psychiatric:        Mood and Affect: Mood and affect and mood normal.         Behavior: Behavior normal.     ED Results / Procedures / Treatments   Labs (all labs ordered are listed, but only abnormal results are displayed) Labs Reviewed  CBG MONITORING, ED - Abnormal; Notable for the following components:      Result Value   Glucose-Capillary 61 (*)    All other components within normal limits  CBG MONITORING, ED - Abnormal; Notable for the following components:   Glucose-Capillary 160 (*)    All other components within normal limits  CBC  BASIC METABOLIC PANEL    EKG None  Radiology No results found.  Procedures Procedures (including critical care time)  Medications Ordered in ED Medications - No data to display  ED Course  I have reviewed the triage vital signs and the nursing notes.  Pertinent labs & imaging results that were available during my care of the patient were reviewed by me and considered in my medical decision making (see chart for details).    MDM Rules/Calculators/A&P                          Patient here with an episode of hypoglycemia.  Family member actually reports that she had 2 episodes throughout the day today.  EMS was called for the second, and she was found to be hypoglycemic to 41.  She was given 1 mg IM glucagon.  Her blood sugars have been stable for the past several hours.  She is requesting to be discharged.  I have encouraged her to continue with her regular insulin regimen (sliding scale).    Patient denies any recent illnesses.  She is noted to have a leukocytosis to 18.4, nonspecific given no infectious symptoms.  Given that she is feeling well, and requesting discharge, I will discharge her to home with outpatient follow-up.  She is nontoxic and well-appearing.  She has no further complaints.  Final Clinical Impression(s) / ED Diagnoses Final diagnoses:  Hypoglycemia    Rx / DC Orders ED Discharge Orders    None       Montine Circle, PA-C 07/10/20 0097    Tegeler, Gwenyth Allegra, MD 07/10/20  [phone]

## 2020-07-10 NOTE — ED Notes (Signed)
Patient's CBG 61, has not crossed over yet

## 2020-07-10 NOTE — ED Notes (Signed)
Patient refusing any further vital signs, states she is ready to go

## 2020-07-13 ENCOUNTER — Other Ambulatory Visit: Payer: Self-pay

## 2020-07-13 ENCOUNTER — Ambulatory Visit: Payer: Medicaid Other | Admitting: Podiatry

## 2020-07-13 DIAGNOSIS — M79675 Pain in left toe(s): Secondary | ICD-10-CM

## 2020-07-13 DIAGNOSIS — M79674 Pain in right toe(s): Secondary | ICD-10-CM | POA: Diagnosis not present

## 2020-07-13 DIAGNOSIS — E1149 Type 2 diabetes mellitus with other diabetic neurological complication: Secondary | ICD-10-CM

## 2020-07-13 DIAGNOSIS — B351 Tinea unguium: Secondary | ICD-10-CM

## 2020-07-13 NOTE — Progress Notes (Signed)
Subjective: 54 y.o. returns the office today for painful, elongated, thickened toenails which she cannot trim herself. The calluses have been doing well. No open lesions Denies any systemic complaints such as fevers, chills, nausea, vomiting.   PCP: Julieanne Manson, MD- last seen 03/02/2020 Endocrinologist: Dr. Talmage Coin, MD A1c: 6.2 (09/08/2019)  Objective: AAO 3, NAD DP/PT pulses palpable, CRT less than 3 seconds Sensation decreased with Semmes Weinstein monofilament. Nails hypertrophic, dystrophic, elongated, brittle, discolored 10. There is tenderness overlying the nails 1-5 bilaterally. There is no surrounding erythema or drainage along the nail sites. Slight hyperkeratotic lesion left submetatarsal 5 with very minimal callus formation.  There is no open sores or signs of infection. Hammertoes present No other areas of tenderness bilateral lower extremities. No overlying edema, erythema, increased warmth. No pain with calf compression, swelling, warmth, erythema.  Assessment: Patient presents with symptomatic onychomycosis, hyperkeratotic lesion  Plan: -Treatment options including alternatives, risks, complications were discussed -Nails sharply debrided 10 without complication/bleeding. -Discussed daily foot inspection. If there are any changes, to call the office immediately.  -Follow-up in 3 months or sooner if any problems are to arise. In the meantime, encouraged to call the office with any questions, concerns, changes symptoms.  Ovid Curd, DPM

## 2020-07-14 ENCOUNTER — Telehealth (INDEPENDENT_AMBULATORY_CARE_PROVIDER_SITE_OTHER): Payer: Medicaid Other | Admitting: Clinical

## 2020-07-14 ENCOUNTER — Telehealth: Payer: Medicaid Other | Admitting: Clinical

## 2020-07-14 DIAGNOSIS — F609 Personality disorder, unspecified: Secondary | ICD-10-CM | POA: Diagnosis not present

## 2020-07-19 IMAGING — MG DIGITAL SCREENING BILAT W/ TOMO W/ CAD
6 of 10 series · 6 of 30 positions shown · non-contrast
Comparison: Previous exam(s).

CLINICAL DATA: Screening.

EXAM:
DIGITAL SCREENING BILATERAL MAMMOGRAM WITH TOMO AND CAD

[L CC synth-2D (1 of 2)]
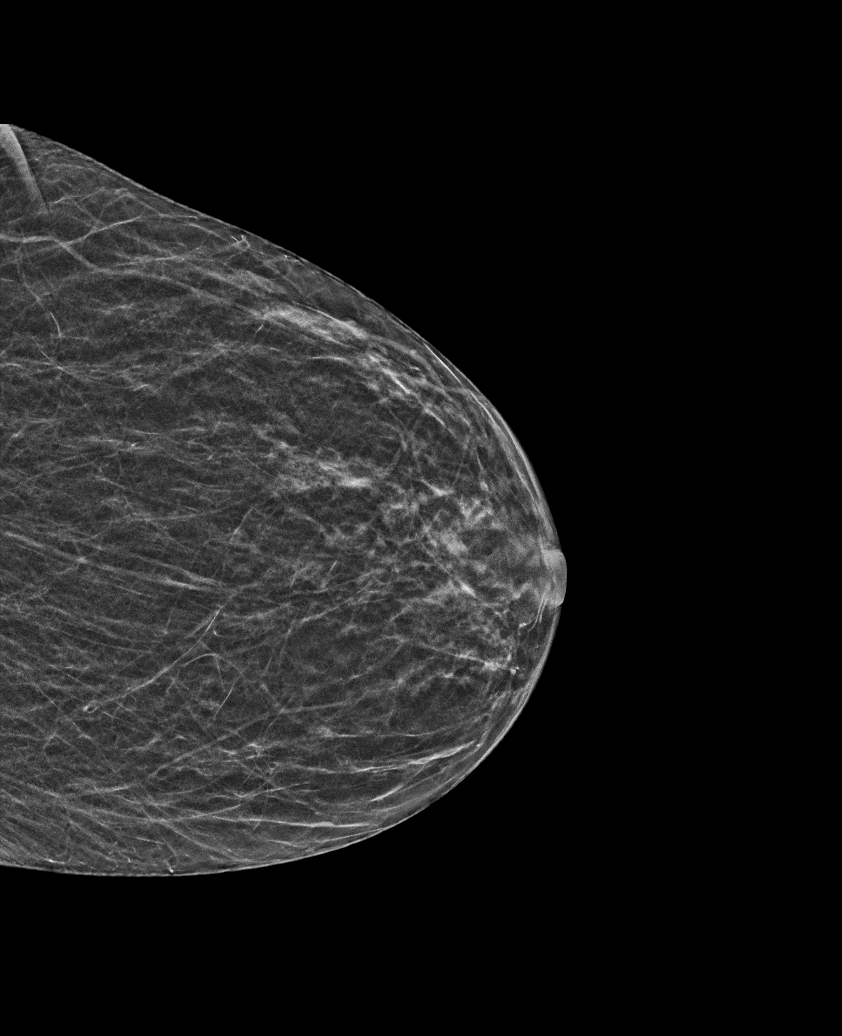

[L CC synth-2D (2 of 2)]
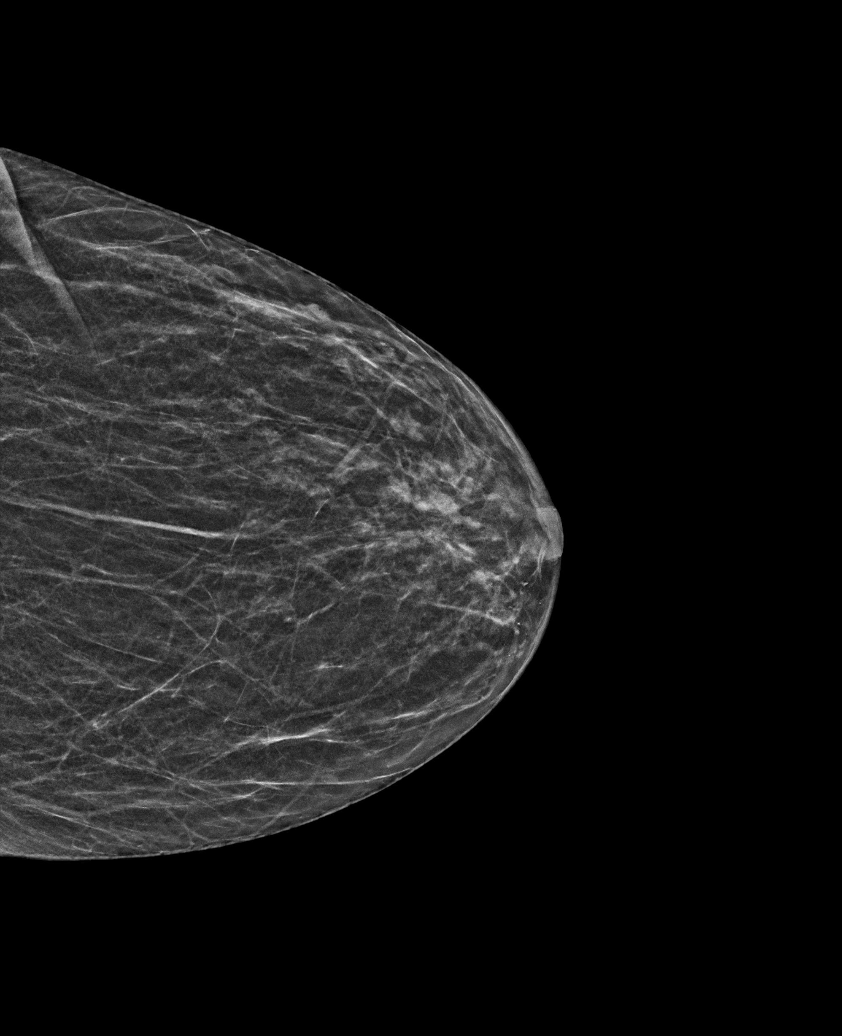

[R MLO synth-2D]
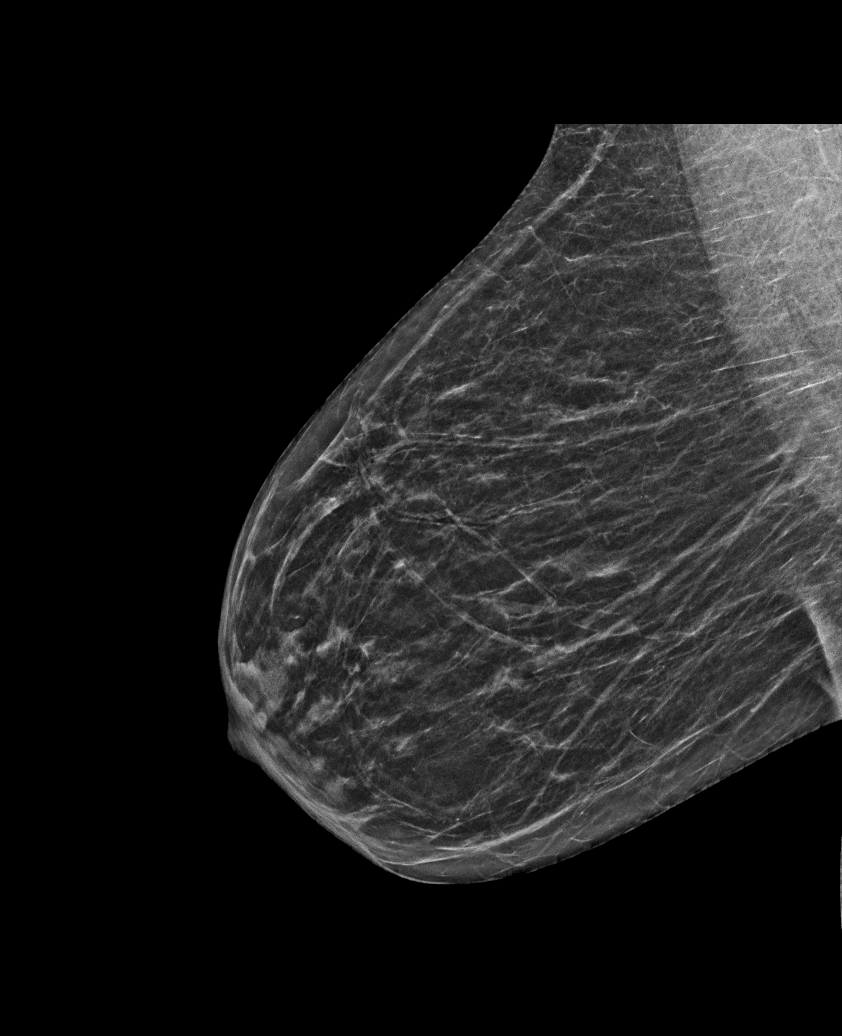

[R CC synth-2D]
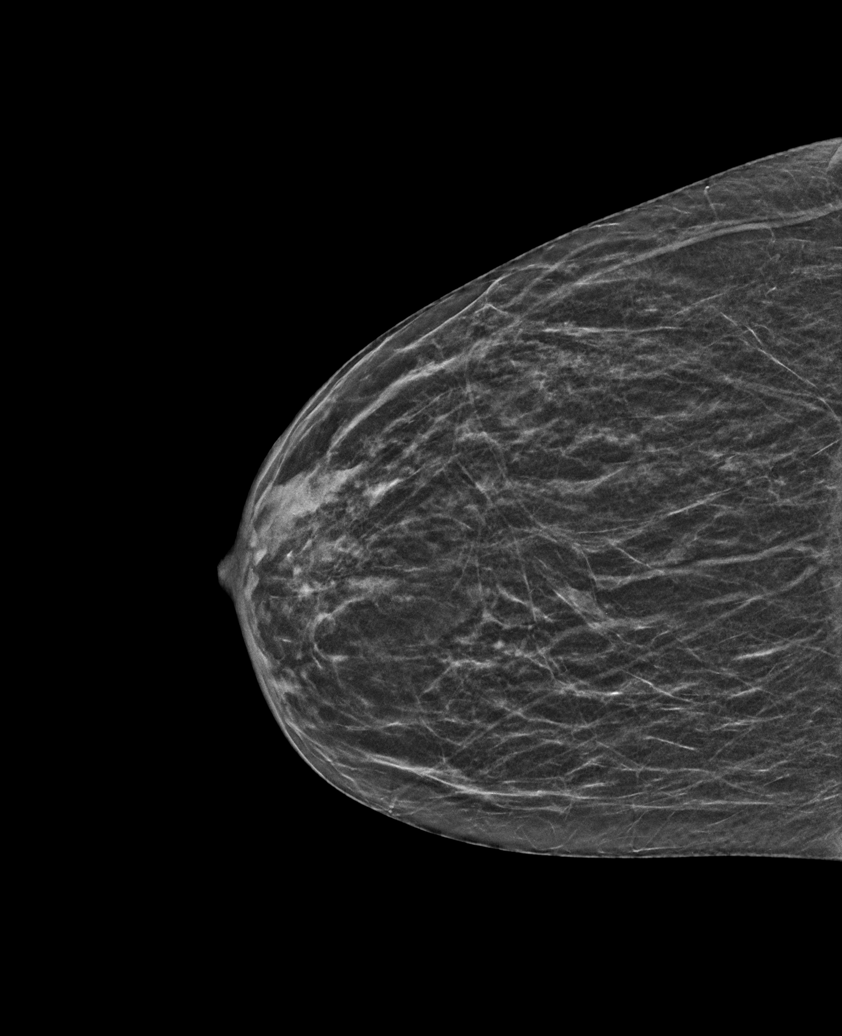

[L MLO synth-2D]
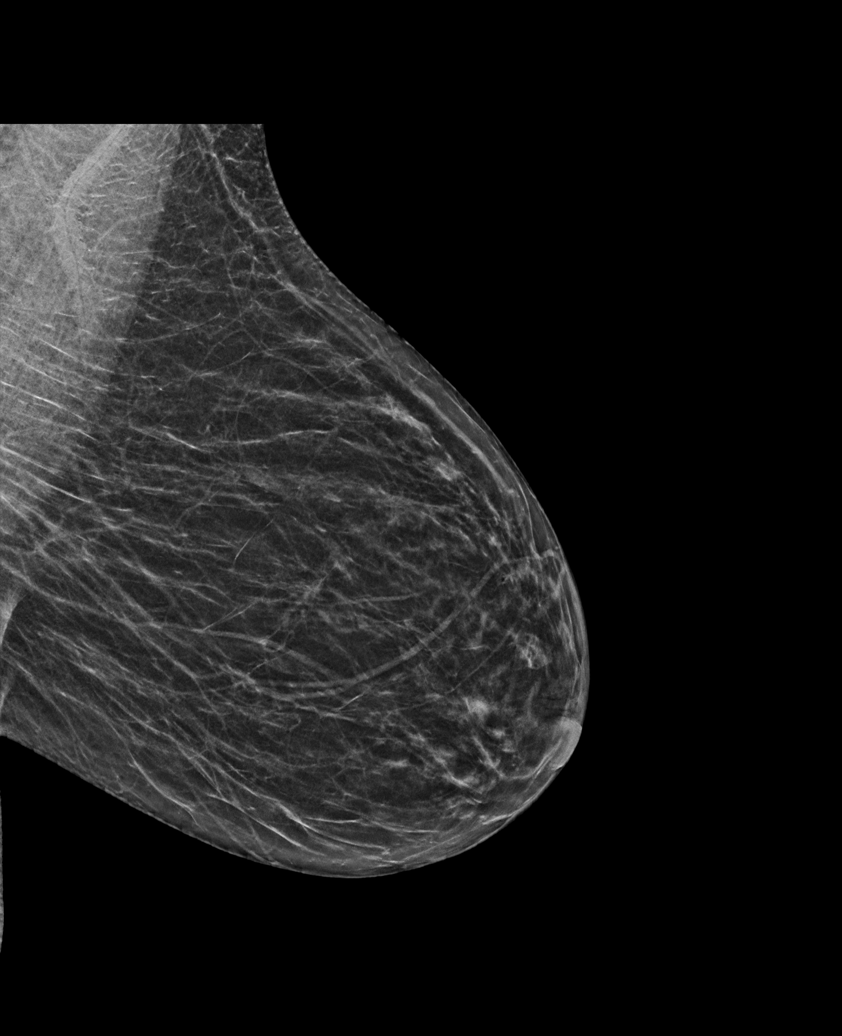

[L CC tomo · tomo slice 19/37.0]
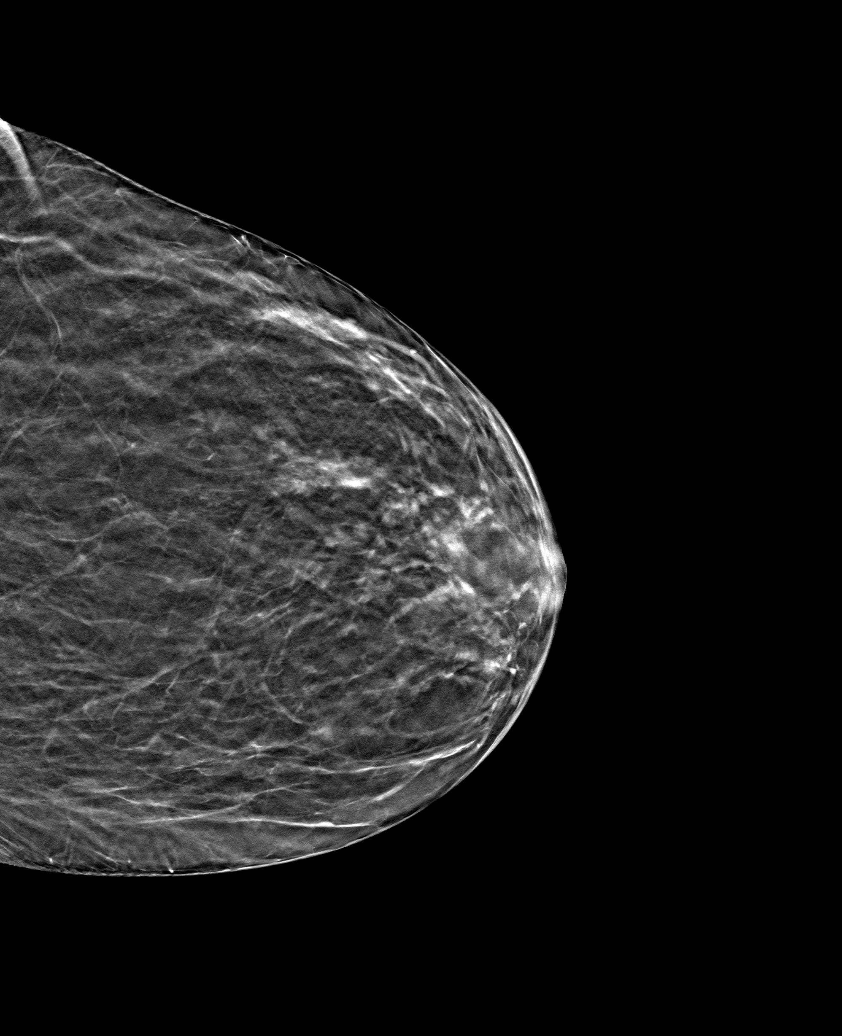

[6 of 30 positions shown; findings below may reference images not displayed]

ACR Breast Density Category b: There are scattered areas of
fibroglandular density.
FINDINGS: There are no findings suspicious for malignancy. Images were
processed with CAD.
IMPRESSION: No mammographic evidence of malignancy. A result letter of this
screening mammogram will be mailed directly to the patient.

RECOMMENDATION:
Screening mammogram in one year. (Code:[id number])

BI-RADS CATEGORY  1: Negative.

## 2020-07-19 NOTE — Progress Notes (Signed)
   THERAPY PROGRESS NOTE Via updox  Session Time: 2:15-3:00pm (due to difficulty connecting to the internet, session began late) Participation Level: Active Behavioral Response: CasualAlertEuthymic Type of Therapy: Individual Therapy Treatment Goals addressed: Coping and anger management   Purpose: LCSW met with client for routine individual therapy to work towards treatment goals:Coping and anger management  Intervention:LCSW met with client for routine individual therapy to work towards treatment goals:Coping and anger management. LCSW assessed for any significant events and assessed how she is doing today. LCSW informed patient this session would be short due to technology and LCSW feeling sick. LCSW provided reflective listening as patient processed recent events. LCSW utilized intervention of Supportive Counseling identified her continued boundary setting and the importance of this as part of self care especially her health care. LCSW assessed for SI/HI/command psychosis.  Effectiveness: Patient is alert x4 affect. Patient identified she is managing. Patient reports the man she is helping went voluntary to rehab so she is taking care of the daughter and pets so she has been busy.   When LCSW asked recent ER visit, patient reports another diabetic episode as she is not eating after her insulin. Patient recognized the importance of eating to prevent any ER visits. Patient identified she does have handy snacks for when she is experiencing low sugar. Patient recognized feeling overwhelm with these changes at her home. Patient identified although she feels for the girl she is helping to take care off, she is recognizing the girl and the man are not her responsibility, if the man does not change she will seek out support with local DSS as of right now she is just doing a favor from kindness of her heart. Patient recognized the importance of taking care of herself, her wellbeing and physical health.  Patient reports changes in emotion regulation not letting small things trigger her.   Intervention was effective as patient was able to reflect and able to report changes in thinking perspectives. Progress towards goal is Ongoing. Patient denied active suicidal/homicidal/active psychosis.  Plan Patient offered next appointment for: 01/05 2pm via virtual  Diagnosis:  unspecified personality disorder    [name], LCSW 07/19/2020

## 2020-07-28 ENCOUNTER — Telehealth: Payer: Self-pay | Admitting: Clinical

## 2020-07-29 MED ORDER — NYSTATIN 100000 UNIT/GM EX CREA: 1.0000 "application " | TOPICAL_CREAM | Freq: Two times a day (BID) | CUTANEOUS | 0 refills | Status: DC

## 2020-07-29 NOTE — Telephone Encounter (Signed)
LCSW informed patient provider sent in prescription Nystatin. Patient verbalized understanding

## 2020-08-04 ENCOUNTER — Telehealth (INDEPENDENT_AMBULATORY_CARE_PROVIDER_SITE_OTHER): Payer: Medicaid Other | Admitting: Clinical

## 2020-08-04 DIAGNOSIS — F609 Personality disorder, unspecified: Secondary | ICD-10-CM

## 2020-08-06 ENCOUNTER — Other Ambulatory Visit: Payer: Self-pay | Admitting: Internal Medicine

## 2020-08-10 NOTE — Progress Notes (Signed)
   THERAPY PROGRESS NOTE Via updox Session Time: 2-3pm Participation Level: Active Behavioral Response: CasualAlertEuthymic and Irritable Type of Therapy: Individual Therapy Treatment Goals addressed: Anger and Coping   Purpose: LCSW met with client for routine individual therapy to work towards treatment goals: learn coping skills to manage anger symptoms.   Intervention: LCSW met with client for routine individual therapy to work towards treatment goals: learn coping skills to manage anger symptoms. LCSW  assessed for any significant events and she is doing today. LCSW utilized intervention of DBT coping skills of emotion regulation skills to manage anger. LCSW provided reflective listening as patient processed recent event, patient in session was able to challenge her own thinking process that increases her anger. LCSW assessed for SI/HI/command psychosis.  Effectiveness: Patient is alert x4 affect. Patient checked in today she was okay. Patient processed immediately event that occurred in office yesterday. Patient reports she was walking her dogs when her a member of the community asked her to be sure to pick up dogs mess. Patient reports she was alarmed on the tone of the member. Patient reports she informed the member she is responsible so she was going to pick it up. However in office patient expressed her feelings regarding this persons tone and response loud. Patient reports it made her irritable. In session patient continued to process this event, however did note "I want to control my anger, I don't want to end up being physical/verbal aggressive."  Patient recognized she did manage not becoming verbally or physically aggressive to the member however she did let this event ruminate on her feelings of anger. Patient recognized in session, "ignorance does not mean I need to be angry." Patient is beginning to recognize to not let emotions be in control.   Patient reports "I am trying to be  humble but is it going to get advantage" patient processed this thought and wanting to learn to regulate her emotions to where she is not doing harm to herself. Patient identified as she is working on herself and her emotion regulation there are good outcomes such as her relationship with her granddaughter and her daughter. Patient processed although she and daughter don't always agree on certain things, she will always be her daughter and will support her. Patient identified motivation to be positive as she can feel it both mentally and physically. Intervention was effective as patient is recognizing her behaviors when she is unable to regulate her painful emotions like anger. Patient is identifying her progress as she is practicing regulating emotions and wanting to make changes.    Progress towards goal is Ongoing. Patient denied active suicidal/homicidal/active psychosis.  Plan Patient offered next appointment for:  08/18/2020 2pm via virtual   Diagnosis: unspecified personality disorder     [name], LCSW 08/10/2020

## 2020-08-11 ENCOUNTER — Telehealth: Payer: Self-pay | Admitting: Interventional Cardiology

## 2020-08-11 NOTE — Telephone Encounter (Signed)
Patient's call sent straight to triage. Patient complaining of chest pain that is relieved with nitroglycerin, but chest pain comes back in a few hours. Patient has history of CAD and stated she has two stents. Patient stated her chest pain is similar to chest pain she has had in the past before her heart cath.  Patient also complaining about left arm pain, but she thinks it might be due to her free style meter that is worn in her left arm, that she uses for her DM. Patient stated her CP has been coming and going for about a week. Encouraged patient to go to the ED, patient refusing at this time, and would rather come into the office. Made patient an appointment with DOD, Dr. Shari Prows tomorrow. Instructed patient to go to the ED if her symptoms do not improve with nitroglycerin. Patient verbalized understanding.

## 2020-08-11 NOTE — Telephone Encounter (Signed)
[name] [name],  Looks like you will be seeing [name] [name] as she declined going to the ER.  I have known her for over 10 years, and it is tough to get a good history from her.  She never really looks or feels great.  She has atypical chest pain and she has terrible disease as well, prior STEMI when I met her and NSTEMI a few years later.  Good luck.  JV 

## 2020-08-11 NOTE — Telephone Encounter (Signed)
Pt c/o of Chest Pain: STAT if CP now or developed within 24 hours  1. Are you having CP right now? Yes   2. Are you experiencing any other symptoms (ex. SOB, nausea, vomiting, sweating)? Nausea, headaches  3. How long have you been experiencing CP? 1 week  4. Is your CP continuous or coming and going? Coming and going   5. Have you taken Nitroglycerin? Yes  ?

## 2020-08-11 NOTE — Telephone Encounter (Signed)
Thank you for the heads up. I will keep you posted!

## 2020-08-12 ENCOUNTER — Encounter: Payer: Self-pay | Admitting: Cardiology

## 2020-08-12 ENCOUNTER — Encounter (HOSPITAL_COMMUNITY): Payer: Self-pay | Admitting: Emergency Medicine

## 2020-08-12 ENCOUNTER — Inpatient Hospital Stay (HOSPITAL_COMMUNITY)
Admission: EM | Admit: 2020-08-12 | Discharge: 2020-08-14 | DRG: 247 | Disposition: A | Discharge status: Home / Self Care | Payer: Medicaid Other | Attending: Cardiology | Admitting: Cardiology

## 2020-08-12 ENCOUNTER — Observation Stay (HOSPITAL_BASED_OUTPATIENT_CLINIC_OR_DEPARTMENT_OTHER): Payer: Medicaid Other

## 2020-08-12 ENCOUNTER — Other Ambulatory Visit: Payer: Self-pay

## 2020-08-12 ENCOUNTER — Ambulatory Visit (INDEPENDENT_AMBULATORY_CARE_PROVIDER_SITE_OTHER): Payer: Medicaid Other | Admitting: Cardiology

## 2020-08-12 ENCOUNTER — Emergency Department (HOSPITAL_COMMUNITY): Payer: Medicaid Other

## 2020-08-12 VITALS — BP 90/60 | HR 83 | Ht 60.0 in | Wt 164.8 lb

## 2020-08-12 DIAGNOSIS — E785 Hyperlipidemia, unspecified: Secondary | ICD-10-CM | POA: Diagnosis not present

## 2020-08-12 DIAGNOSIS — K3184 Gastroparesis: Secondary | ICD-10-CM | POA: Diagnosis present

## 2020-08-12 DIAGNOSIS — E118 Type 2 diabetes mellitus with unspecified complications: Secondary | ICD-10-CM | POA: Diagnosis present

## 2020-08-12 DIAGNOSIS — I10 Essential (primary) hypertension: Secondary | ICD-10-CM

## 2020-08-12 DIAGNOSIS — R079 Chest pain, unspecified: Secondary | ICD-10-CM

## 2020-08-12 DIAGNOSIS — Z955 Presence of coronary angioplasty implant and graft: Secondary | ICD-10-CM

## 2020-08-12 DIAGNOSIS — Z794 Long term (current) use of insulin: Secondary | ICD-10-CM

## 2020-08-12 DIAGNOSIS — I25119 Atherosclerotic heart disease of native coronary artery with unspecified angina pectoris: Secondary | ICD-10-CM | POA: Diagnosis not present

## 2020-08-12 DIAGNOSIS — K219 Gastro-esophageal reflux disease without esophagitis: Secondary | ICD-10-CM | POA: Diagnosis present

## 2020-08-12 DIAGNOSIS — Z9861 Coronary angioplasty status: Secondary | ICD-10-CM

## 2020-08-12 DIAGNOSIS — Z8249 Family history of ischemic heart disease and other diseases of the circulatory system: Secondary | ICD-10-CM

## 2020-08-12 DIAGNOSIS — Z79899 Other long term (current) drug therapy: Secondary | ICD-10-CM

## 2020-08-12 DIAGNOSIS — Z20822 Contact with and (suspected) exposure to covid-19: Secondary | ICD-10-CM | POA: Diagnosis present

## 2020-08-12 DIAGNOSIS — I252 Old myocardial infarction: Secondary | ICD-10-CM

## 2020-08-12 DIAGNOSIS — E1159 Type 2 diabetes mellitus with other circulatory complications: Secondary | ICD-10-CM

## 2020-08-12 DIAGNOSIS — K922 Gastrointestinal hemorrhage, unspecified: Secondary | ICD-10-CM

## 2020-08-12 DIAGNOSIS — Z7902 Long term (current) use of antithrombotics/antiplatelets: Secondary | ICD-10-CM

## 2020-08-12 DIAGNOSIS — Z720 Tobacco use: Secondary | ICD-10-CM | POA: Diagnosis present

## 2020-08-12 DIAGNOSIS — E1142 Type 2 diabetes mellitus with diabetic polyneuropathy: Secondary | ICD-10-CM | POA: Diagnosis present

## 2020-08-12 DIAGNOSIS — Z886 Allergy status to analgesic agent status: Secondary | ICD-10-CM

## 2020-08-12 DIAGNOSIS — Z800 Family history of malignant neoplasm of digestive organs: Secondary | ICD-10-CM

## 2020-08-12 DIAGNOSIS — I2510 Atherosclerotic heart disease of native coronary artery without angina pectoris: Secondary | ICD-10-CM

## 2020-08-12 DIAGNOSIS — E1122 Type 2 diabetes mellitus with diabetic chronic kidney disease: Secondary | ICD-10-CM | POA: Diagnosis present

## 2020-08-12 DIAGNOSIS — F17210 Nicotine dependence, cigarettes, uncomplicated: Secondary | ICD-10-CM | POA: Diagnosis present

## 2020-08-12 DIAGNOSIS — T82855A Stenosis of coronary artery stent, initial encounter: Principal | ICD-10-CM | POA: Diagnosis present

## 2020-08-12 DIAGNOSIS — E1069 Type 1 diabetes mellitus with other specified complication: Secondary | ICD-10-CM

## 2020-08-12 DIAGNOSIS — Z801 Family history of malignant neoplasm of trachea, bronchus and lung: Secondary | ICD-10-CM

## 2020-08-12 DIAGNOSIS — E108 Type 1 diabetes mellitus with unspecified complications: Secondary | ICD-10-CM | POA: Diagnosis present

## 2020-08-12 DIAGNOSIS — F17200 Nicotine dependence, unspecified, uncomplicated: Secondary | ICD-10-CM | POA: Diagnosis present

## 2020-08-12 DIAGNOSIS — I200 Unstable angina: Secondary | ICD-10-CM

## 2020-08-12 DIAGNOSIS — E1169 Type 2 diabetes mellitus with other specified complication: Secondary | ICD-10-CM

## 2020-08-12 DIAGNOSIS — I129 Hypertensive chronic kidney disease with stage 1 through stage 4 chronic kidney disease, or unspecified chronic kidney disease: Secondary | ICD-10-CM | POA: Diagnosis present

## 2020-08-12 DIAGNOSIS — Y831 Surgical operation with implant of artificial internal device as the cause of abnormal reaction of the patient, or of later complication, without mention of misadventure at the time of the procedure: Secondary | ICD-10-CM | POA: Diagnosis present

## 2020-08-12 DIAGNOSIS — E11319 Type 2 diabetes mellitus with unspecified diabetic retinopathy without macular edema: Secondary | ICD-10-CM | POA: Diagnosis present

## 2020-08-12 DIAGNOSIS — M069 Rheumatoid arthritis, unspecified: Secondary | ICD-10-CM | POA: Diagnosis present

## 2020-08-12 DIAGNOSIS — Z803 Family history of malignant neoplasm of breast: Secondary | ICD-10-CM

## 2020-08-12 DIAGNOSIS — E1143 Type 2 diabetes mellitus with diabetic autonomic (poly)neuropathy: Secondary | ICD-10-CM | POA: Diagnosis present

## 2020-08-12 DIAGNOSIS — I25110 Atherosclerotic heart disease of native coronary artery with unstable angina pectoris: Secondary | ICD-10-CM | POA: Diagnosis present

## 2020-08-12 DIAGNOSIS — N183 Chronic kidney disease, stage 3 unspecified: Secondary | ICD-10-CM | POA: Diagnosis present

## 2020-08-12 LAB — CBC
HCT: 38.4 % (ref 36.0–46.0)
HCT: 35.9 % — ABNORMAL LOW (ref 36.0–46.0)
Hemoglobin: 12.7 g/dL (ref 12.0–15.0)
Hemoglobin: 12 g/dL (ref 12.0–15.0)
MCH: 30.7 pg (ref 26.0–34.0)
MCH: 30.3 pg (ref 26.0–34.0)
MCHC: 33.1 g/dL (ref 30.0–36.0)
MCHC: 33.4 g/dL (ref 30.0–36.0)
MCV: 92.8 fL (ref 80.0–100.0)
MCV: 90.7 fL (ref 80.0–100.0)
Platelets: 282 10*3/uL (ref 150–400)
Platelets: 281 10*3/uL (ref 150–400)
RBC: 4.14 MIL/uL (ref 3.87–5.11)
RBC: 3.96 MIL/uL (ref 3.87–5.11)
RDW: 17.7 % — ABNORMAL HIGH (ref 11.5–15.5)
RDW: 17.3 % — ABNORMAL HIGH (ref 11.5–15.5)
WBC: 10.1 10*3/uL (ref 4.0–10.5)
WBC: 8.6 10*3/uL (ref 4.0–10.5)
nRBC: 0 % (ref 0.0–0.2)
nRBC: 0 % (ref 0.0–0.2)

## 2020-08-12 LAB — SARS CORONAVIRUS 2 (TAT 6-24 HRS): SARS Coronavirus 2: NEGATIVE

## 2020-08-12 LAB — TROPONIN I (HIGH SENSITIVITY)
Troponin I (High Sensitivity): 7 ng/L (ref <18)
Troponin I (High Sensitivity): 8 ng/L (ref <18)

## 2020-08-12 LAB — BASIC METABOLIC PANEL
Anion gap: 13 (ref 5–15)
BUN: 26 mg/dL — ABNORMAL HIGH (ref 6–20)
CO2: 23 mmol/L (ref 22–32)
Calcium: 10.1 mg/dL (ref 8.9–10.3)
Chloride: 101 mmol/L (ref 98–111)
Creatinine, Ser: 1.04 mg/dL — ABNORMAL HIGH (ref 0.44–1.00)
GFR, Estimated: >60 mL/min (ref >60)
Glucose, Bld: 119 mg/dL — ABNORMAL HIGH (ref 70–99)
Potassium: 3.9 mmol/L (ref 3.5–5.1)
Sodium: 137 mmol/L (ref 135–145)

## 2020-08-12 LAB — CREATININE, SERUM
Creatinine, Ser: 1.18 mg/dL — ABNORMAL HIGH (ref 0.44–1.00)
GFR, Estimated: 55 mL/min — ABNORMAL LOW (ref >60)

## 2020-08-12 LAB — HEMOGLOBIN A1C
Hgb A1c MFr Bld: 6.7 % — ABNORMAL HIGH (ref 4.8–5.6)
Mean Plasma Glucose: 145.59 mg/dL

## 2020-08-12 LAB — GLUCOSE, CAPILLARY
Glucose-Capillary: 164 mg/dL — ABNORMAL HIGH (ref 70–99)
Glucose-Capillary: 474 mg/dL — ABNORMAL HIGH (ref 70–99)
Glucose-Capillary: 438 mg/dL — ABNORMAL HIGH (ref 70–99)

## 2020-08-12 LAB — D-DIMER, QUANTITATIVE: D-Dimer, Quant: 3.89 ug/mL-FEU — ABNORMAL HIGH (ref 0.00–0.50)

## 2020-08-12 LAB — ECHOCARDIOGRAM COMPLETE
Area-P 1/2: 4.6 cm2
Height: 60 in
S' Lateral: 3.4 cm
Weight: 2636.8 oz

## 2020-08-12 LAB — CBG MONITORING, ED: Glucose-Capillary: 136 mg/dL — ABNORMAL HIGH (ref 70–99)

## 2020-08-12 MED ORDER — ONDANSETRON HCL 4 MG/2ML IJ SOLN: 4.0000 mg | Freq: Four times a day (QID) | INTRAMUSCULAR | Status: DC | PRN

## 2020-08-12 MED ORDER — ACETAMINOPHEN 500 MG PO TABS
1000.0000 mg | ORAL_TABLET | Freq: Once | ORAL | Status: AC
  Administered 2020-08-12: 1000 mg via ORAL
  Filled 2020-08-12: qty 2

## 2020-08-12 MED ORDER — INSULIN ASPART 100 UNIT/ML ~~LOC~~ SOLN
0.0000 [IU] | Freq: Every day | SUBCUTANEOUS | Status: DC
  Administered 2020-08-12: 5 [IU] via SUBCUTANEOUS

## 2020-08-12 MED ORDER — GABAPENTIN 100 MG PO CAPS
100.0000 mg | ORAL_CAPSULE | Freq: Every day | ORAL | Status: DC
  Administered 2020-08-12 – 2020-08-13 (×2): 100 mg via ORAL
  Filled 2020-08-12 (×2): qty 1

## 2020-08-12 MED ORDER — ALBUTEROL SULFATE HFA 108 (90 BASE) MCG/ACT IN AERS
2.0000 | INHALATION_SPRAY | Freq: Four times a day (QID) | RESPIRATORY_TRACT | Status: DC | PRN
  Filled 2020-08-12: qty 6.7

## 2020-08-12 MED ORDER — CLOPIDOGREL BISULFATE 75 MG PO TABS: 75.0000 mg | ORAL_TABLET | Freq: Every day | ORAL | Status: DC

## 2020-08-12 MED ORDER — INSULIN GLARGINE 100 UNIT/ML ~~LOC~~ SOLN
5.0000 [IU] | Freq: Every day | SUBCUTANEOUS | Status: DC
  Administered 2020-08-12: 5 [IU] via SUBCUTANEOUS
  Filled 2020-08-12 (×4): qty 0.05

## 2020-08-12 MED ORDER — INSULIN ASPART 100 UNIT/ML ~~LOC~~ SOLN
0.0000 [IU] | Freq: Three times a day (TID) | SUBCUTANEOUS | Status: DC
  Administered 2020-08-12: 2 [IU] via SUBCUTANEOUS
  Administered 2020-08-13: 5 [IU] via SUBCUTANEOUS

## 2020-08-12 MED ORDER — NITROGLYCERIN 0.4 MG SL SUBL
0.4000 mg | SUBLINGUAL_TABLET | SUBLINGUAL | Status: DC | PRN
  Administered 2020-08-13: 0.4 mg via SUBLINGUAL
  Filled 2020-08-12: qty 1

## 2020-08-12 MED ORDER — TRAMADOL HCL 50 MG PO TABS
50.0000 mg | ORAL_TABLET | Freq: Two times a day (BID) | ORAL | Status: DC | PRN
  Administered 2020-08-12 – 2020-08-13 (×3): 50 mg via ORAL
  Filled 2020-08-12 (×3): qty 1

## 2020-08-12 MED ORDER — CYCLOBENZAPRINE HCL 10 MG PO TABS
10.0000 mg | ORAL_TABLET | Freq: Every evening | ORAL | Status: DC | PRN
  Administered 2020-08-12: 10 mg via ORAL
  Filled 2020-08-12: qty 1

## 2020-08-12 MED ORDER — TRAMADOL HCL 50 MG PO TABS
50.0000 mg | ORAL_TABLET | Freq: Once | ORAL | Status: AC
  Administered 2020-08-12: 50 mg via ORAL
  Filled 2020-08-12: qty 1

## 2020-08-12 MED ORDER — PANTOPRAZOLE SODIUM 40 MG PO TBEC
40.0000 mg | DELAYED_RELEASE_TABLET | Freq: Two times a day (BID) | ORAL | Status: DC
  Administered 2020-08-12 – 2020-08-14 (×4): 40 mg via ORAL
  Filled 2020-08-12 (×4): qty 1

## 2020-08-12 MED ORDER — METOPROLOL TARTRATE 25 MG PO TABS
25.0000 mg | ORAL_TABLET | Freq: Two times a day (BID) | ORAL | Status: DC
  Administered 2020-08-12 – 2020-08-14 (×4): 25 mg via ORAL
  Filled 2020-08-12 (×4): qty 1

## 2020-08-12 MED ORDER — HEPARIN SODIUM (PORCINE) 5000 UNIT/ML IJ SOLN
5000.0000 [IU] | Freq: Three times a day (TID) | INTRAMUSCULAR | Status: DC
  Administered 2020-08-12: 5000 [IU] via SUBCUTANEOUS
  Filled 2020-08-12 (×2): qty 1

## 2020-08-12 MED ORDER — SERTRALINE HCL 50 MG PO TABS
150.0000 mg | ORAL_TABLET | Freq: Every day | ORAL | Status: DC
  Administered 2020-08-13 – 2020-08-14 (×2): 150 mg via ORAL
  Filled 2020-08-12 (×2): qty 1

## 2020-08-12 MED ORDER — ACETAMINOPHEN 325 MG PO TABS
650.0000 mg | ORAL_TABLET | ORAL | Status: DC | PRN
  Administered 2020-08-12: 650 mg via ORAL
  Filled 2020-08-12: qty 2

## 2020-08-12 MED ORDER — ROSUVASTATIN CALCIUM 20 MG PO TABS
40.0000 mg | ORAL_TABLET | Freq: Every day | ORAL | Status: DC
  Administered 2020-08-12 – 2020-08-14 (×3): 40 mg via ORAL
  Filled 2020-08-12 (×3): qty 2

## 2020-08-12 MED ORDER — PREDNISOLONE ACETATE 1 % OP SUSP
4.0000 [drp] | Freq: Every day | OPHTHALMIC | Status: DC
  Administered 2020-08-13: 4 [drp] via OPHTHALMIC
  Filled 2020-08-12: qty 5

## 2020-08-12 MED ORDER — FOLIC ACID 1 MG PO TABS
1000.0000 ug | ORAL_TABLET | Freq: Every day | ORAL | Status: DC
  Administered 2020-08-13 – 2020-08-14 (×2): 1 mg via ORAL
  Filled 2020-08-12 (×2): qty 1

## 2020-08-12 NOTE — Patient Instructions (Signed)
Medication Instructions:  Your physician recommends that you continue on your current medications as directed. Please refer to the Current Medication list given to you today.  *If you need a refill on your cardiac medications before your next appointment, please call your pharmacy*   Lab Work: None If you have labs (blood work) drawn today and your tests are completely normal, you will receive your results only by: Marland Kitchen MyChart Message (if you have MyChart) OR . A paper copy in the mail If you have any lab test that is abnormal or we need to change your treatment, we will call you to review the results.   Testing/Procedures: None   Follow-Up:  Follow up will be based on hospitalization   Other Instructions

## 2020-08-12 NOTE — Progress Notes (Signed)
D-dimer resulted at 3.89.  CrCl 48.3  Ongoing chest pain that is reproducible on palpation. O2 is 100% on room air, she is not tachycardic. Echocardiogram did not show right heart strain.   She has been admitted for chest pain workup including possible heart cath vs stress test tomorrow. She is hemodynamically stable without signs of respiratory distress. In anticipation for possible contrast load tomorrow, we will hold off on CTA to rule out PE. Will defer to primary team tomorrow.   Case discussed with Dr. Anne Fu.     Marcelino Duster, PA-C 08/12/2020, 8:04 PM [phone] [facility] [address] [place], [place] [address]

## 2020-08-12 NOTE — ED Notes (Signed)
Pt stuck once for IV by another RN. Paige RN informed this RN that IV blew. This RN went in to try to get an IV when pt began getting upset and yelling at the RN, "You're not going to stick me. I've been stuck 4 times you're not sticking me again." This RN tried to inform pt that she had been stuck twice earlier for blood by a phlebotomist and that a 2nd RN needed to attempt to get an IV. Pt stated "well you are not sticking me again." This RN made note will inform the doctor and continue to monitor pt.
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
## 2020-08-12 NOTE — Progress Notes (Signed)
Cardiology Office Note:    Date:  08/12/2020   ID:  [name] [name], DOB 14-Jul-1966, MRN [id number]  PCP:  Mack Hook, MD  [place] Cardiologist:  No primary care provider on file.  [facility] Electrophysiologist:  None   Referring MD: Mack Hook, MD    History of Present Illness:    [name] [name] is a 55 y.o. female with a hx of with history of inferior MI in 2010, NSTEMi in 2014 s/p DES to LCx with post-cath course complicated by bleeding on plavix which was stopped before the 80monthrecommended period, DMII, HLD, HTN, and CKD who follows with Dr. VIrish Lackbut presents as an urgent visit for evaluation of chest pain.  Per review of Dr. VHassell Donelast clinic note on 03/25/20, the patient has suffered intermittent chest discomfort over the past several years. She was offered a lexiscan in the past but declined as she thought her symptoms were related to anxiety. She called clinic yesterday with episode of chest pain with associated nausea and HA that improved with nitroglycerin but then recurred. She was recommended to go to the ER but declined and instead decided to come to clinic today.  Today, the patient states she is having active chest pain and looks uncomfortable. Feels like a squeezing pain in her left breast similar to the pain she had with her heart attack in the past. Pain got better with NTG and if she rests. She has been using her NTG frequently over the past week but it stopped working as well over the past day. She states any movement makes her symptoms worse especially walking.  She really does not want to go to the ER but after a long discussion, she was amenable.  Past Medical History:  Diagnosis Date  . Acute myocardial infarction of other lateral wall, initial episode of care   . Acute myocardial infarction, unspecified site, initial episode of care   . Acute osteomyelitis   . Allergic rhinitis   . Anemia   . Anginal pain ([place]    07/15/13- no chest  pain in months"  . Anxiety   . Bipolar affective ([facility]   . CAD (coronary artery disease) 07/2011   s/p DES mid and distal RCA with 50% LAD  . Daily headache    not daily  . Depression    Bipolar disorder  . Diabetic foot ulcer associated with diabetes mellitus due to underlying condition ([place] 09/26/2018  . Diabetic gastroparesis associated with type 2 diabetes mellitus ([facility]   . Diabetic peripheral neuropathy associated with type 2 diabetes mellitus ([facility]   . Diabetic retinopathy   . Esophageal stenosis   . Esophageal ulcer   . Esophagitis   . Gastroparesis   . Genital herpes    Reportedly tested and documented by [facility]--rare occurrences  . GERD (gastroesophageal reflux disease)   . Heart murmur   . History of stomach ulcers   . Hyperlipidemia   . Hypertension   . Inferior MI ([place] 01/20/2009   /[facility] [place] 12/19/2012, "that's the only one I've had" (12/19/2012)  . Migraines   . Orthopnoea   . Pneumonia 2012  . Polysubstance abuse ([place]    Crack cocaine--none since 2008, MJ, ETOH:  clean of all since 2008  . Renal insufficiency   . Renal lithiasis    Bilateral  . Rheumatoid arthritis(714.0)   . Sebaceous cyst   . Stroke ([facility] 2011   denies residual on 12/19/2012.  "Years ago"  . Type  II diabetes mellitus ([place])    Previously uncontrolled for many years with multiple complications.  2017 controlled. 05/2016:  6.7%    Past Surgical History:  Procedure Laterality Date  . CORONARY ANGIOPLASTY WITH STENT PLACEMENT  01/20/2009   "2" (12/19/2012)  . CORONARY ANGIOPLASTY WITH STENT PLACEMENT  2012   "2" (12/19/2012)  . CORONARY ANGIOPLASTY WITH STENT PLACEMENT  12/19/2012   "2" (12/19/2012)  . ESOPHAGOGASTRODUODENOSCOPY N/A 02/26/2015   Procedure: ESOPHAGOGASTRODUODENOSCOPY (EGD);  Surgeon: Teena Irani, MD;  Location: [facility] ENDOSCOPY;  Service: Endoscopy;  Laterality: N/A;  . EYE SURGERY     Multiple surgeries of both eyes:  last laser was 09/08/2015 of right eye.  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 GAS INSERTION Left 07/16/2013   Procedure: INSERTION OF GAS;  Surgeon: Adonis Brook, MD;  Location: [place];  Service: Ophthalmology;  Laterality: Left;  SF6  . GAS/FLUID EXCHANGE Left 07/30/2013   Procedure: GAS/FLUID EXCHANGE;  Surgeon: Adonis Brook, MD;  Location: [place];  Service: Ophthalmology;  Laterality: Left;  . IRRIGATION AND DEBRIDEMENT SEBACEOUS CYST Right 03/2011   "pointer" (12/19/2012)  . LEFT HEART CATHETERIZATION WITH CORONARY ANGIOGRAM N/A 07/06/2011   Procedure: LEFT HEART CATHETERIZATION WITH CORONARY ANGIOGRAM;  Surgeon: Jettie Booze, MD;  Location: [facility] CATH LAB;  Service: Cardiovascular;  Laterality: N/A;  possible PCI  . LEFT HEART CATHETERIZATION WITH CORONARY ANGIOGRAM N/A 12/19/2012   Procedure: LEFT HEART CATHETERIZATION WITH CORONARY ANGIOGRAM;  Surgeon: Jettie Booze, MD;  Location: [facility] CATH LAB;  Service: Cardiovascular;  Laterality: N/A;  . MEMBRANE PEEL Left 07/16/2013   Procedure: MEMBRANE PEEL;  Surgeon: Adonis Brook, MD;  Location: [place];  Service: Ophthalmology;  Laterality: Left;  . PARS PLANA VITRECTOMY Left 07/16/2013   Procedure: PARS PLANA VITRECTOMY WITH 23 GAUGE;  Surgeon: Adonis Brook, MD;  Location: [place];  Service: Ophthalmology;  Laterality: Left;  . PARS PLANA VITRECTOMY Left 07/30/2013   Procedure: PARS PLANA VITRECTOMY WITH 23 GAUGE WITH ENDOLASER;  Surgeon: Adonis Brook, MD;  Location: [place];  Service: Ophthalmology;  Laterality: Left;  with endolaser  . PERCUTANEOUS CORONARY STENT INTERVENTION (PCI-S) N/A 07/06/2011   Procedure: PERCUTANEOUS CORONARY STENT INTERVENTION (PCI-S);  Surgeon: Jettie Booze, MD;  Location[facility] C[facility] LAB;  Service: Cardiovascular;  Laterality: N/A;  . PHOTOCOAGULATION WITH LASER Left 07/16/2013   Procedure: PHOTOCOAGULATION WITH LASER;  Surgeon: Adonis Brook, MD;  Location: [place];  Service: Ophthalmology;  Laterality: Left;  ENDOLASER    Current  Medications: Current Meds  Medication Sig  . Accu-Chek FastClix Lancets MISC CHECK BLOOD SUGAR 5 TIMES DAILY  . ACCU-CHEK GUIDE test strip USE AS DIRECTED CHECK BLOOD SUGAR 5 TIMES A DAY  . acetaminophen (TYLENOL) 500 MG tablet Take 500-1,000 mg by mouth every 6 (six) hours as needed for moderate pain.   Marland Kitchen albuterol (PROVENTIL HFA;VENTOLIN HFA) 108 (90 Base) MCG/ACT inhaler Inhale 2 puffs into the lungs every 6 (six) hours as needed for wheezing or shortness of breath.  Marland Kitchen ammonium lactate (AMLACTIN) 12 % cream Apply topically as needed for dry skin.  . Blood Glucose Monitoring Suppl (ACCU-CHEK GUIDE ME) w/Device KIT USE TO MONITOR BLOOD SUGAR FIVE TIMES A DAY DX  E10.22 IN VITRO  . Cholecalciferol (VITAMIN D3) 25 MCG (1000 UT) CHEW CHEW 1 TABLET DAILY  . clopidogrel (PLAVIX) 75 MG tablet Take 1 tablet (75 mg total) by mouth daily.  . cyclobenzaprine (FLEXERIL) 10 MG tablet Take 1-2 tablets by mouth  at bedtime as needed. Muscle spasms  . diphenhydrAMINE (BENADRYL) 25 MG tablet Take 25 mg by mouth every 6 (six) hours as needed.  . famotidine (PEPCID) 20 MG tablet Take 20 mg by mouth 2 (two) times daily.  . folic acid (FOLVITE) 122 MCG tablet Take 400 mcg by mouth daily. 3 tabs by mouth daily   . furosemide (LASIX) 40 MG tablet TAKE 1 TABLET BY MOUTH EVERY DAY IN THE MORNING  . gabapentin (NEURONTIN) 100 MG capsule TAKE 1 CAPSULE BY MOUTH AT BEDTIME.  . Incontinence Supply Disposable (POISE ULTRA THINS) PADS Use 5 pads daily as needed for urinary stress incontinence  . insulin aspart (NOVOLOG) 100 UNIT/ML injection Inject 4-16 Units into the skin 3 (three) times daily with meals. Sliding scale CBG 100-150; 4 units, 151-200; 6 units, 201-250; 8 units, 251-300; 10 units, 301-350; 12 units, 351-400; 14 units, > 401; call doctor  . LANTUS SOLOSTAR 100 UNIT/ML Solostar Pen Inject 10 Units into the skin 2 (two) times daily.   . methotrexate (RHEUMATREX) 2.5 MG tablet Take 10 mg by mouth once a week.   .  metoprolol tartrate (LOPRESSOR) 25 MG tablet TAKE 1 TABLET BY MOUTH TWICE A DAY  . nitroGLYCERIN (NITROSTAT) 0.4 MG SL tablet Place 1 tablet (0.4 mg total) under the tongue every 5 (five) minutes as needed for chest pain. Max 3 doses, call 911.  . nystatin cream (MYCOSTATIN) Apply 1 application topically 2 (two) times daily. Apply twice daily to affected areas until resolved.  . ondansetron (ZOFRAN) 4 MG tablet TAKE 1 TABLET (4 MG TOTAL) BY MOUTH EVERY 6 (SIX) HOURS.  Marland Kitchen pantoprazole (PROTONIX) 40 MG tablet TAKE 1 TABLET (40 MG TOTAL) BY MOUTH 2 (TWO) TIMES DAILY BEFORE A MEAL FOR 30 DAYS, THEN 1 TABLET (40 MG TOTAL) DAILY BEFORE BREAKFAST.  Marland Kitchen prednisoLONE acetate (PRED FORTE) 1 % ophthalmic suspension Place 4-5 drops into the left eye daily.  . rosuvastatin (CRESTOR) 40 MG tablet Take 1 tablet (40 mg total) by mouth daily.  . sertraline (ZOLOFT) 100 MG tablet 1 1/2 TABS BY MOUTH DAILY  . traMADol (ULTRAM) 50 MG tablet TAKE 1 TABLET EVERY 6 HOURS AS NEEDED FOR MODERATE PAIN  . triamcinolone cream (KENALOG) 0.1 % APPLY ON THE SKIN TWICE A DAY X2 WEEKS, TAKE 2 WEEKS OFF THEN REPEAT     Allergies:   Aspirin   Social History   Socioeconomic History  . Marital status: Divorced    Spouse name: Not on file  . Number of children: 1  . Years of education: 57  . Highest education level: [employer] school graduate  Occupational History  . Occupation: Disabled   Tobacco Use  . Smoking status: Current Every Day Smoker    Packs/day: 0.33    Years: 22.00    Pack years: 7.26    Types: Cigarettes  . Smokeless tobacco: Never Used  . Tobacco comment: Has been to classes--not sure if she wants to quit.  Changing to non menthol..  Chantix, patches, gum never helped.  9/19:  lot going on.  Vaping Use  . Vaping Use: Never used  Substance and Sexual Activity  . Alcohol use: Yes    Alcohol/week: 0.0 standard drinks    Comment: Extremely rare--once yearly or less.  . Drug use: No    Types: "Crack" cocaine,  Marijuana    Comment: 12/31/2012 " clean from crack.  Does still smoke marijuana   . Sexual activity: Not Currently    Partners: Male  Birth control/protection: None  Other Topics Concern  . 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   Lives by herself near [place].   Divorced in 2013-10-06.    Social Determinants of Health   Financial Resource Strain: Low Risk   . Difficulty of Paying Living Expenses: Not hard at all  Food Insecurity: No Food Insecurity  . Worried About Charity fundraiser in the Last Year: Never true  . Ran Out of Food in the Last Year: Never true  Transportation Needs: No Transportation Needs  . Lack of Transportation (Medical): No  . Lack of Transportation (Non-Medical): No  Physical Activity: Not on file  Stress: Not on file  Social Connections: Not on file     Family History: The patient's family history includes Breast cancer (age of onset: 71) in her mother; Cancer (age of onset: 72) in her sister; Hypertension in her father and sister; Lung cancer (age of onset: 70) in her sister; Stomach cancer (age of onset: 81) in her sister. There is no history of Colon cancer.  ROS:   Please see the history of present illness.    Review of Systems  Constitutional: Positive for diaphoresis and malaise/fatigue. Negative for fever.  HENT: Negative for congestion.   Eyes: Negative for blurred vision.  Respiratory: Positive for shortness of breath.   Cardiovascular: Positive for chest pain. Negative for palpitations, orthopnea, claudication, leg swelling and PND.  Gastrointestinal: Positive for abdominal pain and nausea. Negative for vomiting.  Genitourinary: Negative for hematuria.  Musculoskeletal: Positive for myalgias.  Neurological: Negative for dizziness and loss of consciousness.  Endo/Heme/Allergies: Negative for polydipsia.  Psychiatric/Behavioral:  The patient is nervous/anxious.     EKGs/Labs/Other Studies Reviewed:    The following studies were reviewed today: TTE 10/06/17: Study Conclusions   - Left ventricle: Basal septla and inferior basal hypokinesis The  cavity size was mildly dilated. Wall thickness was increased in a  pattern of mild LVH. Systolic function was normal. The estimated  ejection fraction was in the range of 50% to 55%. Wall motion was  normal; there were no regional wall motion abnormalities. Left  ventricular diastolic function parameters were normal.  - Right atrium: The atrium was mildly dilated.  - Atrial septum: A patent foramen ovale cannot be excluded.   Cath 06-Oct-2012: IMPRESSIONS:  1. Normal left main coronary artery. 2. No significant disease in the left anterior descending artery and its branches. 3. 90% lesion in the mid left circumflex artery.  This appears to be a ruptured plaque and was successfully stented with a 2.75 x 23 drug-eluting stent, postdilated to 3.3 mm in diameter. 4. Occluded mid right coronary artery, which appears chronic.  The entire distal stented segment appears occluded.  The distal RCA system has faint collaterals from the left system. 5. Mildly decreased left ventricular systolic function.  LVEDP 30 mmHg.  Ejection fraction 40-45 %.  RECOMMENDATION:  The patient will be watched overnight.  Continue dual antiplatelet therapy indefinitely.  We'll also continue IV nitroglycerin for at least a few hours.  Cath 2010/10/06: IMPRESSIONS:  1. 50% mid LAD lesion 2. Normal left ventricular systolic function.  LVEDP 9 mmHg.  Ejection fraction 50%. 3. Severe disease of the right coronary artery.  Successful overlapping drug-eluting stents placed from the mid to distal right coronary artery.  RECOMMENDATION:  She needs to  remain on Plavix.  Her LVEDP is increased.  We will diuresis her.  She will need medical therapy for decreased LV function.  She'll be watched overnight.  EKG:   EKG is  ordered today.  The ekg ordered today demonstrates NSR with TWI inversions in the inferior leads and V6 (similar to prior). One PVC  Recent Labs: 10/23/2019: TSH 1.030 03/25/2020: ALT 8 07/10/2020: BUN 25; Creatinine, Ser 1.15; Hemoglobin 12.9; Platelets 278; Potassium 3.7; Sodium 133  Recent Lipid Panel    Component Value Date/Time   CHOL 149 09/08/2019 1503   TRIG 66 09/08/2019 1503   HDL 70 09/08/2019 1503   CHOLHDL 1.9 01/30/2019 0848   CHOLHDL 2.1 06/16/2018 0642   VLDL 14 06/16/2018 0642   LDLCALC 66 09/08/2019 1503     Risk Assessment/Calculations:       Physical Exam:    VS:  BP 90/60   Pulse 83   Ht 5' (1.524 m)   Wt 164 lb 12.8 oz (74.8 kg)   LMP 01/12/2015   SpO2 98%   BMI 32.19 kg/m     Wt Readings from Last 3 Encounters:  08/12/20 164 lb 12.8 oz (74.8 kg)  07/10/20 150 lb (68 kg)  04/20/20 149 lb (67.6 kg)     GEN:  Uncomfortably appearing, diaphoretic HEENT: Normal NECK: No JVD; No carotid bruits CARDIAC: RRR, no murmurs, rubs, gallops RESPIRATORY:  Clear to auscultation without rales, wheezing or rhonchi  ABDOMEN: Soft, mildly tender,, non-distended MUSCULOSKELETAL:  No edema; No deformity  SKIN: Warm and dry NEUROLOGIC:  Alert and oriented x 3 PSYCHIATRIC:  Normal affect   ASSESSMENT:    1. Coronary artery disease involving native coronary artery of native heart with angina pectoris ([place])   2. Chest pain of uncertain etiology   3. Type 2 diabetes mellitus with other circulatory complication, unspecified whether long term insulin use ([place])   4. Hyperlipidemia, unspecified hyperlipidemia type   5. Essential hypertension   6. Chest pain, unspecified type   7. Old MI (myocardial infarction)   8. Tobacco abuse    PLAN:    In order of problems listed above:  #Chest Pain: #Coronary artery disease s/p PCI to RCA and LCx: Patient with known CAD with history of inferior MI in 2012 s/p DES to RCA and NSTEMI in 2014 s/p DES to Lcx. TTE  2019 with EF 50-55%, basal septal and inferior hypokinesis. Called clinic yesterday with episode of chest pain the initially improved with NTG but then recurred. Refused to go to ER at that time.  -Transfer to the ER for emergent evaluation and coronary angiography -Check trop, ECG, continous tele -ASA allergy; off plavix due to bleeding issues -Continue crestor 84m daily -Can use morphine for pain control--may have RCA lesion so would be cautious with nitro -No BB/ARB due to soft blood pressures -Obtain TTE -Tobacco cessation counseling   #DMII: Management per PCP -Continue lantus  #HLD:  Last LDL at goal 66 on 07/2/202. -Continue crestor 467mdaily  #CKD: -Management per primary care  #Tobacco Use: -Tobacco cessation counseling   Plan discussed with the patient and her daughter at length and they understand and are amenable to go to the hospital at this time.  Total time of encounter: 60 minutes total time of encounter, including 45 minutes spent in face-to-face patient care on the date of this encounter. This time includes coordination of care and counseling regarding above mentioned problem list. Remainder of non-face-to-face time involved reviewing chart documents/testing relevant  to the patient encounter and documentation in the medical record. I have independently reviewed documentation from referring provider.    Shared Decision Making/Informed Consent The risks [stroke (1 in 1000), death (1 in 1000), kidney failure [usually temporary] (1 in 500), bleeding (1 in 200), allergic reaction [possibly serious] (1 in 200)], benefits (diagnostic support and management of coronary artery disease) and alternatives of a cardiac catheterization were discussed in detail with [name] [name] and she is willing to proceed.   Medication Adjustments/Labs and Tests Ordered: Current medicines are reviewed at length with the patient today.  Concerns regarding medicines are outlined above.  Orders  Placed This Encounter  Procedures  . EKG 12-Lead   No orders of the defined types were placed in this encounter.   Patient Instructions  Medication Instructions:  Your physician recommends that you continue on your current medications as directed. Please refer to the Current Medication list given to you today.  *If you need a refill on your cardiac medications before your next appointment, please call your pharmacy*   Lab Work: None If you have labs (blood work) drawn today and your tests are completely normal, you will receive your results only by: Marland Kitchen MyChart Message (if you have MyChart) OR . A paper copy in the mail If you have any lab test that is abnormal or we need to change your treatment, we will call you to review the results.   Testing/Procedures: None   Follow-Up:  Follow up will be based on hospitalization   Other Instructions      Signed, Freada Bergeron, MD  08/12/2020 10:04 AM    [place]

## 2020-08-12 NOTE — ED Triage Notes (Signed)
Patient BIB [facility], seen at [facility] today for chest pain that is exacerbated by inspiration and palpation, no EKG changes. Patient alert and oriented and in no apparent distress at this time.

## 2020-08-12 NOTE — Progress Notes (Signed)
  Echocardiogram 2D Echocardiogram has been performed.  [name] [name] 08/12/2020, 4:32 PM

## 2020-08-12 NOTE — ED Provider Notes (Signed)
[facility] EMERGENCY DEPARTMENT Provider Note   CSN: [id number] Arrival date & time: 08/12/20  1020     History Chief Complaint  Patient presents with  . Chest Pain    [name] [name] is a 54 y.o. female.  Patient with history of heart attack, stent placement 2010 2014, followed by local cardiology, drug abuse history has been clean per her report since second heart attack presents with left chest pain and left upper back pain worse with a deep breath and movement.  Patient's had intermittent for 6 days now.  Patient had appointment today at cardiology and sent over for further evaluation and blood work.  Patient unsure if this is similar to heart attack pain in the past.  Patient has history of ulcers, rheumatoid arthritis, bipolar.        Past Medical History:  Diagnosis Date  . Acute myocardial infarction of other lateral wall, initial episode of care   . Acute myocardial infarction, unspecified site, initial episode of care   . Acute osteomyelitis   . Allergic rhinitis   . Anemia   . Anginal pain ([place])    07/15/13- no chest pain in months"  . Anxiety   . Bipolar affective ([place])   . CAD (coronary artery disease) 07/2011   s/p DES mid and distal RCA with 50% LAD  . Daily headache    not daily  . Depression    Bipolar disorder  . Diabetic foot ulcer associated with diabetes mellitus due to underlying condition ([place]) 09/26/2018  . Diabetic gastroparesis associated with type 2 diabetes mellitus ([place])   . Diabetic peripheral neuropathy associated with type 2 diabetes mellitus ([place])   . Diabetic retinopathy   . Esophageal stenosis   . Esophageal ulcer   . Esophagitis   . Gastroparesis   . Genital herpes    Reportedly tested and documented by [facility]--rare occurrences  . GERD (gastroesophageal reflux disease)   . Heart murmur   . History of stomach ulcers   . Hyperlipidemia   . Hypertension   . Inferior MI ([place]) 01/20/2009   Archie Endo on 12/19/2012,  "that's the only one I've had" (12/19/2012)  . Migraines   . Orthopnoea   . Pneumonia 2012  . Polysubstance abuse ([place])    Crack cocaine--none since 2008, MJ, ETOH:  clean of all since 2008  . Renal insufficiency   . Renal lithiasis    Bilateral  . Rheumatoid arthritis(714.0)   . Sebaceous cyst   . Stroke [facility]) 2011   denies residual on 12/19/2012.  "Years ago"  . Type II diabetes mellitus (HCC)    Previously uncontrolled for many years with multiple complications.  2017 controlled. 05/2016:  6.7%    Patient Active Problem List   Diagnosis Date Noted  . Vitamin D deficiency 02/16/2020  . Renal insufficiency   . Renal lithiasis   . Proliferative retinopathy due to type 1 diabetes mellitus ([place]) 09/03/2019  . Stress and adjustment reaction 09/03/2019  . Tinea cruris 05/13/2019  . Elevated alkaline phosphatase measurement 02/25/2019  . Furunculosis 02/25/2019  . Corneal edema 12/02/2018  . Iron deficiency 12/02/2018  . Neuropathy 12/02/2018  . Molluscum contagiosum 11/19/2018  . Diabetic ulcer of left heel associated with type 2 diabetes mellitus, limited to breakdown of skin ([place]) 11/19/2018  . Diabetic foot ulcer associated with diabetes mellitus due to underlying condition ([place]) 09/26/2018  . Chest pain 06/15/2018  . Bipolar disease, chronic ([place]) 06/15/2018  . H/O esophageal ulcer  06/15/2018  . Acute pulmonary edema (HCC)   . 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 Diabetic gastroparesis associated with type 2 diabetes mellitus ([place])   . Diabetic peripheral neuropathy associated with type 2 diabetes mellitus ([place])   . Acute osteomyelitis of left foot ([place]) 02/15/2018  . Foot pain 02/15/2018  . Nausea and vomiting 02/15/2018  . Hypokalemia 02/15/2018  . Fibromyalgia 05/29/2017  . Insomnia 05/29/2017  . Shoulder injury 05/29/2017  . Closed displaced fracture of middle phalanx of right ring finger 05/26/2017  . Urinary, incontinence, stress female 10/14/2015  . Obesity (BMI 30-39.9)  06/21/2015  . Acute renal failure syndrome ([place])   . CKD (chronic kidney disease), stage III ([place]) 02/21/2015  . Chronic systolic congestive heart failure, NYHA class 1 ([place]) 02/21/2015  . History of NSTEMI w/ DES to cfx May 2014 10/02/2014  . Nuclear cataract of right eye 09/17/2014  . Status post intraocular lens implant 09/17/2014  . 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 PCO (posterior capsular opacification) 11/26/2013  . Hyperlipidemia   . Tobacco abuse 10/03/2013  . Amblyopia 10/02/2013  . Corneal opacification 10/02/2013  . Retinal detachment, tractional, left eye 10/02/2013  . [place] risk medication use 02/20/2013  . Diabetic gastroparesis ([place]) 01/16/2013  . Nausea vomiting and diarrhea 12/29/2012  . CAD (coronary artery disease) 08/25/2010  . Dehydration 06/17/2010  . Anemia 03/21/2010  . ALLERGIC RHINITIS 12/28/2009  . HTN (hypertension) 10/08/2009  . Rheumatoid arthritis ([place]) 02/18/2009  . Diabetic retinopathy ([place]) 08/13/2007  . GERD 03/27/2007  . RENAL INSUFFICIENCY, CHRONIC 03/27/2007  . Anxiety state 03/25/2007  . DEPRESSION 03/25/2007  . ULCER, ESOPHAGUS WITHOUT BLEEDING 07/06/2006  . ESOPHAGEAL STENOSIS 07/06/2006  . Diabetes mellitus with multiple complications ([place]) 13/24/4010    Past Surgical History:  Procedure Laterality Date  . CORONARY ANGIOPLASTY WITH STENT PLACEMENT  01/20/2009   "2" (12/19/2012)  . CORONARY ANGIOPLASTY WITH STENT PLACEMENT  2012   "2" (12/19/2012)  . CORONARY ANGIOPLASTY WITH STENT PLACEMENT  12/19/2012   "2" (12/19/2012)  . ESOPHAGOGASTRODUODENOSCOPY N/A 02/26/2015   Procedure: ESOPHAGOGASTRODUODENOSCOPY (EGD);  Surgeon: Teena Irani, MD;  Location: [facility] ENDOSCOPY;  Service: Endoscopy;  Laterality: N/A;  . EYE SURGERY     Multiple surgeries of both eyes:  last laser was 09/08/2015 of right eye.  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 GAS INSERTION Left 07/16/2013   Procedure: INSERTION OF GAS;  Surgeon: Adonis Brook, MD;  Location: [facility];  Service: Ophthalmology;  Laterality: Left;  SF6  . GAS/FLUID EXCHANGE Left 07/30/2013   Procedure: GAS/FLUID EXCHANGE;  Surgeon: Adonis Brook, MD;  Location: [facility];  Service: Ophthalmology;  Laterality: Left;  . IRRIGATION AND DEBRIDEMENT SEBACEOUS CYST Right 03/2011   "pointer" (12/19/2012)  . LEFT HEART CATHETERIZATION WITH CORONARY ANGIOGRAM N/A 07/06/2011   Procedure: LEFT HEART CATHETERIZATION WITH CORONARY ANGIOGRAM;  Surgeon: Jettie Booze, MD;  Location: [facility] CATH LAB;  Service: Cardiovascular;  Laterality: N/A;  possible PCI  . LEFT HEART CATHETERIZATION WITH CORONARY ANGIOGRAM N/A 12/19/2012   Procedure: LEFT HEART CATHETERIZATION WITH CORONARY ANGIOGRAM;  Surgeon: Jettie Booze, MD;  Location[facility] C[facility] LAB;  Service: Cardiovascular;  Laterality: N/A;  . MEMBRANE PEEL Left 07/16/2013   Procedure: MEMBRANE PEEL;  Surgeon: Adonis Brook, MD;  Location: [facility];  Service: Ophthalmology;  Laterality: Left;  . PARS PLANA VITRECTOMY Left 07/16/2013   Procedure: PARS PLANA VITRECTOMY WITH 23 GAUGE;  Surgeon: Adonis Brook, MD;  Location: [facility];  Service: Ophthalmology;  Laterality: Left;  . PARS PLANA VITRECTOMY Left 07/30/2013   Procedure: PARS PLANA VITRECTOMY WITH 23 GAUGE WITH ENDOLASER;  Surgeon: Adonis Brook, MD;  Location: [facility];  Service: Ophthalmology;  Laterality: Left;  with endolaser  . PERCUTANEOUS CORONARY STENT INTERVENTION (PCI-S) N/A 07/06/2011   Procedure: PERCUTANEOUS CORONARY STENT INTERVENTION (PCI-S);  Surgeon: Jettie Booze, MD;  Location: [facility] CATH LAB;  Service: Cardiovascular;  Laterality: N/A;  . PHOTOCOAGULATION WITH LASER Left 07/16/2013   Procedure: PHOTOCOAGULATION WITH LASER;  Surgeon: Adonis Brook, MD;  Location: [facility];  Service: Ophthalmology;  Laterality: Left;  ENDOLASER     OB History   No obstetric history on file.     Family History  Problem Relation Age of Onset  . Breast cancer Mother 58  . Hypertension Father    . Stomach cancer Sister 58       cause of death  . Hypertension Sister   . Lung cancer Sister 69       Cause of death  . Cancer Sister 74       ? stomach cancer  . Colon cancer Neg Hx     Social History   Tobacco Use  . Smoking status: Current Every Day Smoker    Packs/day: 0.33    Years: 22.00    Pack years: 7.26    Types: Cigarettes  . Smokeless tobacco: Never Used  . Tobacco comment: Has been to classes--not sure if she wants to quit.  Changing to non menthol..  Chantix, patches, gum never helped.  9/19:  lot going on.  Vaping Use  . Vaping Use: Never used  Substance Use Topics  . Alcohol use: Yes    Alcohol/week: 0.0 standard drinks    Comment: Extremely rare--once yearly or less.  . Drug use: No    Types: "Crack" cocaine, Marijuana    Comment: 12/31/2012 " clean from crack.  Does still smoke marijuana     Home Medications Prior to Admission medications   Medication Sig Start Date End Date Taking? Authorizing Provider  Accu-Chek FastClix Lancets MISC CHECK BLOOD SUGAR 5 TIMES DAILY 12/24/18   [provider]  ACCU-CHEK GUIDE test strip USE AS DIRECTED CHECK BLOOD SUGAR 5 TIMES A DAY 12/23/18   [provider]  acetaminophen (TYLENOL) 500 MG tablet Take 500-1,000 mg by mouth every 6 (six) hours as needed for moderate pain.     [provider]  albuterol (PROVENTIL HFA;VENTOLIN HFA) 108 (90 Base) MCG/ACT inhaler Inhale 2 puffs into the lungs every 6 (six) hours as needed for wheezing or shortness of breath. 12/01/16   Mack Hook, MD  ammonium lactate (AMLACTIN) 12 % cream Apply topically as needed for dry skin. 09/15/19   Trula Slade, DPM  Blood Glucose Monitoring Suppl (ACCU-CHEK GUIDE ME) w/Device KIT USE TO MONITOR BLOOD SUGAR FIVE TIMES A DAY DX  E10.22 IN VITRO 12/26/18   [provider]  Cholecalciferol (VITAMIN D3) 25 MCG (1000 UT) CHEW CHEW 1 TABLET DAILY 04/19/20   Mack Hook, MD  clopidogrel (PLAVIX) 75 MG  tablet Take 1 tablet (75 mg total) by mouth daily. 09/03/19   Mack Hook, MD  cyclobenzaprine (FLEXERIL) 10 MG tablet Take 1-2 tablets by mouth at bedtime as needed. Muscle spasms 09/21/16   [provider]  diphenhydrAMINE (BENADRYL) 25 MG tablet Take 25 mg by mouth every 6 (six) hours as needed.    [provider]  famotidine (PEPCID) 20 MG tablet Take 20 mg by mouth 2 (  two) times daily. 07/22/20   [provider]  folic acid (FOLVITE) 831 MCG tablet Take 400 mcg by mouth daily. 3 tabs by mouth daily     [provider]  furosemide (LASIX) 40 MG tablet TAKE 1 TABLET BY MOUTH EVERY DAY IN THE MORNING 05/20/20   Mack Hook, MD  gabapentin (NEURONTIN) 100 MG capsule TAKE 1 CAPSULE BY MOUTH AT BEDTIME. 10/13/19   Trula Slade, DPM  Incontinence Supply Disposable (POISE ULTRA THINS) PADS Use 5 pads daily as needed for urinary stress incontinence 01/03/16   Mack Hook, MD  insulin aspart (NOVOLOG) 100 UNIT/ML injection Inject 4-16 Units into the skin 3 (three) times daily with meals. Sliding scale CBG 100-150; 4 units, 151-200; 6 units, 201-250; 8 units, 251-300; 10 units, 301-350; 12 units, 351-400; 14 units, > 401; call doctor    [provider]  LANTUS SOLOSTAR 100 UNIT/ML Solostar Pen Inject 10 Units into the skin 2 (two) times daily.  01/15/18   [provider]  methotrexate (RHEUMATREX) 2.5 MG tablet Take 10 mg by mouth once a week.  04/21/19   [provider]  metoprolol tartrate (LOPRESSOR) 25 MG tablet TAKE 1 TABLET BY MOUTH TWICE A DAY 05/27/20   Mack Hook, MD  nitroGLYCERIN (NITROSTAT) 0.4 MG SL tablet Place 1 tablet (0.4 mg total) under the tongue every 5 (five) minutes as needed for chest pain. Max 3 doses, call 911. 04/12/20   Jettie Booze, MD  nystatin cream (MYCOSTATIN) Apply 1 application topically 2 (two) times daily. Apply twice daily to affected areas until resolved. 07/29/20    Mack Hook, MD  ondansetron (ZOFRAN) 4 MG tablet TAKE 1 TABLET (4 MG TOTAL) BY MOUTH EVERY 6 (SIX) HOURS. 08/08/20   Mack Hook, MD  pantoprazole (PROTONIX) 40 MG tablet TAKE 1 TABLET (40 MG TOTAL) BY MOUTH 2 (TWO) TIMES DAILY BEFORE A MEAL FOR 30 DAYS, THEN 1 TABLET (40 MG TOTAL) DAILY BEFORE BREAKFAST. 12/01/19   Mack Hook, MD  prednisoLONE acetate (PRED FORTE) 1 % ophthalmic suspension Place 4-5 drops into the left eye daily. 04/22/17   [provider]  rosuvastatin (CRESTOR) 40 MG tablet Take 1 tablet (40 mg total) by mouth daily. 04/12/20   Jettie Booze, MD  sertraline (ZOLOFT) 100 MG tablet 1 1/2 TABS BY MOUTH DAILY 05/07/20   Mack Hook, MD  traMADol (ULTRAM) 50 MG tablet TAKE 1 TABLET EVERY 6 HOURS AS NEEDED FOR MODERATE PAIN 03/06/17   Mack Hook, MD  triamcinolone cream (KENALOG) 0.1 % APPLY ON THE SKIN TWICE A DAY X2 WEEKS, TAKE 2 WEEKS OFF THEN REPEAT 01/21/19   [provider]    Allergies    Aspirin  Review of Systems   Review of Systems  Constitutional: Negative for chills and fever.  HENT: Negative for congestion.   Eyes: Negative for visual disturbance.  Respiratory: Negative for shortness of breath.   Cardiovascular: Positive for chest pain. Negative for leg swelling.  Gastrointestinal: Negative for abdominal pain and vomiting.  Genitourinary: Negative for dysuria and flank pain.  Musculoskeletal: Positive for back pain. Negative for neck pain and neck stiffness.  Skin: Negative for rash.  Neurological: Negative for light-headedness and headaches.    Physical Exam Updated Vital Signs BP 125/69 (BP Location: Right Arm)   Pulse 81   Temp 98 F (36.7 C) (Oral)   Resp 20   LMP 01/12/2015   SpO2 100%   Physical Exam  ED Results / Procedures / Treatments  Labs (all labs ordered are listed, but only abnormal results are displayed) Labs Reviewed  BASIC METABOLIC PANEL - Abnormal; Notable for the  following components:      Result Value   Glucose, Bld 119 (*)    BUN 26 (*)    Creatinine, Ser 1.04 (*)    All other components within normal limits  CBC - Abnormal; Notable for the following components:   RDW 17.7 (*)    All other components within normal limits  CBG MONITORING, ED - Abnormal; Notable for the following components:   Glucose-Capillary 136 (*)    All other components within normal limits  TROPONIN I ([place] SENSITIVITY)  TROPONIN I ([place] SENSITIVITY)    EKG EKG Interpretation  Date/Time:  Thursday August 12 2020 10:28:38 EST Ventricular Rate:  77 PR Interval:  168 QRS Duration: 102 QT Interval:  380 QTC Calculation: 430 R Axis:   49 Text Interpretation: Normal sinus rhythm Possible Inferior infarct , age undetermined Abnormal ECG INferior T wave inversions mostly unchanged from prior Confirmed by Thayer Jew [phone]) on 08/12/2020 12:08:42 PM   Radiology DG Chest 2 View  Result Date: 08/12/2020 CLINICAL DATA:  Chest pain and short of breath 1 week EXAM: CHEST - 2 VIEW COMPARISON:  03/25/2020 FINDINGS: The heart size and mediastinal contours are within normal limits. Right coronary stent. Both lungs are clear. The visualized skeletal structures are unremarkable. IMPRESSION: No active cardiopulmonary disease. Electronically Signed   By: Franchot Gallo M.D.   On: 08/12/2020 10:48    Procedures Procedures (including critical care time)  Medications Ordered in ED Medications  acetaminophen (TYLENOL) tablet 1,000 mg (has no administration in time range)  traMADol (ULTRAM) tablet 50 mg (has no administration in time range)    ED Course  I have reviewed the triage vital signs and the nursing notes.  Pertinent labs & imaging results that were available during my care of the patient were reviewed by me and considered in my medical decision making (see chart for details).    MDM Rules/Calculators/A&P                          Patient presents with history  of myocardial infarction and coronary artery disease with worsening chest discomfort the past 6 days. Patient Tsui risk from an ACS standpoint however also has component of musculoskeletal reproducible partially on exam. Initial troponin reviewed negative at 7.  Basic blood work reviewed creatinine 1.04, normal white count, normal hemoglobin.  Second troponin pending. Discussed with cardiology and they are planning to admit the patient for further evaluation and management. EKG reviewed nonspecific ST findings overall similar to previous.  Chest x-ray no acute abnormalities reviewed.   Final Clinical Impression(s) / ED Diagnoses Final diagnoses:  Acute chest pain    Rx / DC Orders ED Discharge Orders    None       Elnora Morrison, MD 08/12/20 1320

## 2020-08-12 NOTE — Progress Notes (Addendum)
   Patient admitted from the office for chest pain. Placed admit orders. Please see H&P for fill details. - Office visit note mentions patient is "off Plavix due to bleeding issues." Patient reports she is still taking Plavix and took it this morning so I will continue for now. She reports occasional small amounts of blood in stools if she is constipated or stools are very loose but no significant bleeding. Hemoglobin normal. - We are continuing Lantus. Patient on 10 units twice daily. Will start with 5 units of Lantus at night here and place on sliding scale. Can adjust as needed.  Patient still reports having some chest pain. However, she looks comfortable and was falling asleep while I was talking to her. She report pain left sided pain underneath left breast that is reproducible with palpations of chest pain. Also notes some pleuritic pain. Possible musculoskeletal in nature. She was just given Tylenol and Tramadol while in the ED. I have continued home Tramadol dosing. Will also check D-dimer given pleuritic nature of pain to rule out PE (she is not tachycardic, tachypneic, or hypoxic so think chance of PE is low).  Initial troponin is negative. Repeat pending.  Corrin Parker, PA-C 08/12/2020 2:55 PM

## 2020-08-13 ENCOUNTER — Encounter (HOSPITAL_COMMUNITY): Payer: Self-pay | Admitting: Cardiology

## 2020-08-13 ENCOUNTER — Inpatient Hospital Stay (HOSPITAL_COMMUNITY)
Admission: EM | Disposition: A | Discharge status: Home / Self Care | Payer: Self-pay | Source: Home / Self Care | Attending: Cardiology

## 2020-08-13 DIAGNOSIS — Z8249 Family history of ischemic heart disease and other diseases of the circulatory system: Secondary | ICD-10-CM | POA: Diagnosis not present

## 2020-08-13 DIAGNOSIS — Z79899 Other long term (current) drug therapy: Secondary | ICD-10-CM | POA: Diagnosis not present

## 2020-08-13 DIAGNOSIS — Y831 Surgical operation with implant of artificial internal device as the cause of abnormal reaction of the patient, or of later complication, without mention of misadventure at the time of the procedure: Secondary | ICD-10-CM | POA: Diagnosis present

## 2020-08-13 DIAGNOSIS — Z20822 Contact with and (suspected) exposure to covid-19: Secondary | ICD-10-CM | POA: Diagnosis present

## 2020-08-13 DIAGNOSIS — I252 Old myocardial infarction: Secondary | ICD-10-CM | POA: Diagnosis not present

## 2020-08-13 DIAGNOSIS — E11319 Type 2 diabetes mellitus with unspecified diabetic retinopathy without macular edema: Secondary | ICD-10-CM | POA: Diagnosis present

## 2020-08-13 DIAGNOSIS — R079 Chest pain, unspecified: Secondary | ICD-10-CM | POA: Diagnosis present

## 2020-08-13 DIAGNOSIS — E1143 Type 2 diabetes mellitus with diabetic autonomic (poly)neuropathy: Secondary | ICD-10-CM | POA: Diagnosis present

## 2020-08-13 DIAGNOSIS — T82855A Stenosis of coronary artery stent, initial encounter: Secondary | ICD-10-CM | POA: Diagnosis present

## 2020-08-13 DIAGNOSIS — Z794 Long term (current) use of insulin: Secondary | ICD-10-CM | POA: Diagnosis not present

## 2020-08-13 DIAGNOSIS — I25110 Atherosclerotic heart disease of native coronary artery with unstable angina pectoris: Secondary | ICD-10-CM | POA: Diagnosis present

## 2020-08-13 DIAGNOSIS — E1122 Type 2 diabetes mellitus with diabetic chronic kidney disease: Secondary | ICD-10-CM | POA: Diagnosis present

## 2020-08-13 DIAGNOSIS — I129 Hypertensive chronic kidney disease with stage 1 through stage 4 chronic kidney disease, or unspecified chronic kidney disease: Secondary | ICD-10-CM | POA: Diagnosis present

## 2020-08-13 DIAGNOSIS — Z803 Family history of malignant neoplasm of breast: Secondary | ICD-10-CM | POA: Diagnosis not present

## 2020-08-13 DIAGNOSIS — K3184 Gastroparesis: Secondary | ICD-10-CM | POA: Diagnosis present

## 2020-08-13 DIAGNOSIS — E1142 Type 2 diabetes mellitus with diabetic polyneuropathy: Secondary | ICD-10-CM | POA: Diagnosis present

## 2020-08-13 DIAGNOSIS — N183 Chronic kidney disease, stage 3 unspecified: Secondary | ICD-10-CM | POA: Diagnosis present

## 2020-08-13 DIAGNOSIS — I200 Unstable angina: Secondary | ICD-10-CM | POA: Diagnosis not present

## 2020-08-13 DIAGNOSIS — Z800 Family history of malignant neoplasm of digestive organs: Secondary | ICD-10-CM | POA: Diagnosis not present

## 2020-08-13 DIAGNOSIS — Z886 Allergy status to analgesic agent status: Secondary | ICD-10-CM | POA: Diagnosis not present

## 2020-08-13 DIAGNOSIS — Z7902 Long term (current) use of antithrombotics/antiplatelets: Secondary | ICD-10-CM | POA: Diagnosis not present

## 2020-08-13 DIAGNOSIS — E1159 Type 2 diabetes mellitus with other circulatory complications: Secondary | ICD-10-CM

## 2020-08-13 DIAGNOSIS — M069 Rheumatoid arthritis, unspecified: Secondary | ICD-10-CM | POA: Diagnosis present

## 2020-08-13 DIAGNOSIS — Z801 Family history of malignant neoplasm of trachea, bronchus and lung: Secondary | ICD-10-CM | POA: Diagnosis not present

## 2020-08-13 DIAGNOSIS — F17210 Nicotine dependence, cigarettes, uncomplicated: Secondary | ICD-10-CM | POA: Diagnosis present

## 2020-08-13 DIAGNOSIS — E785 Hyperlipidemia, unspecified: Secondary | ICD-10-CM | POA: Diagnosis present

## 2020-08-13 DIAGNOSIS — K219 Gastro-esophageal reflux disease without esophagitis: Secondary | ICD-10-CM | POA: Diagnosis present

## 2020-08-13 HISTORY — PX: CORONARY STENT INTERVENTION: CATH118234

## 2020-08-13 HISTORY — PX: LEFT HEART CATH AND CORONARY ANGIOGRAPHY: CATH118249

## 2020-08-13 HISTORY — PX: INTRAVASCULAR PRESSURE WIRE/FFR STUDY: CATH118243

## 2020-08-13 LAB — POCT ACTIVATED CLOTTING TIME
Activated Clotting Time: 327 seconds
Activated Clotting Time: 267 seconds

## 2020-08-13 LAB — BASIC METABOLIC PANEL
Anion gap: 13 (ref 5–15)
BUN: 30 mg/dL — ABNORMAL HIGH (ref 6–20)
CO2: 22 mmol/L (ref 22–32)
Calcium: 9.8 mg/dL (ref 8.9–10.3)
Chloride: 100 mmol/L (ref 98–111)
Creatinine, Ser: 1.17 mg/dL — ABNORMAL HIGH (ref 0.44–1.00)
GFR, Estimated: 56 mL/min — ABNORMAL LOW (ref >60)
Glucose, Bld: 279 mg/dL — ABNORMAL HIGH (ref 70–99)
Potassium: 4.4 mmol/L (ref 3.5–5.1)
Sodium: 135 mmol/L (ref 135–145)

## 2020-08-13 LAB — GLUCOSE, CAPILLARY
Glucose-Capillary: 254 mg/dL — ABNORMAL HIGH (ref 70–99)
Glucose-Capillary: 359 mg/dL — ABNORMAL HIGH (ref 70–99)
Glucose-Capillary: 388 mg/dL — ABNORMAL HIGH (ref 70–99)
Glucose-Capillary: 258 mg/dL — ABNORMAL HIGH (ref 70–99)
Glucose-Capillary: 194 mg/dL — ABNORMAL HIGH (ref 70–99)

## 2020-08-13 SURGERY — LEFT HEART CATH AND CORONARY ANGIOGRAPHY
Anesthesia: LOCAL

## 2020-08-13 MED ORDER — CLOPIDOGREL BISULFATE 300 MG PO TABS
ORAL_TABLET | ORAL | Status: DC | PRN
  Administered 2020-08-13: 300 mg via ORAL

## 2020-08-13 MED ORDER — HEPARIN (PORCINE) IN NACL 1000-0.9 UT/500ML-% IV SOLN
INTRAVENOUS | Status: DC | PRN
  Administered 2020-08-13 (×2): 500 mL

## 2020-08-13 MED ORDER — LABETALOL HCL 5 MG/ML IV SOLN: 10.0000 mg | INTRAVENOUS | Status: AC | PRN

## 2020-08-13 MED ORDER — ASPIRIN 81 MG PO CHEW
81.0000 mg | CHEWABLE_TABLET | Freq: Every day | ORAL | Status: DC
  Administered 2020-08-14: 81 mg via ORAL
  Filled 2020-08-13: qty 1

## 2020-08-13 MED ORDER — ASPIRIN 81 MG PO CHEW
CHEWABLE_TABLET | ORAL | Status: DC | PRN
  Administered 2020-08-13: 81 mg via ORAL

## 2020-08-13 MED ORDER — VERAPAMIL HCL 2.5 MG/ML IV SOLN
INTRAVENOUS | Status: AC
  Filled 2020-08-13: qty 2

## 2020-08-13 MED ORDER — SODIUM CHLORIDE 0.9 % IV SOLN
250.0000 mL | INTRAVENOUS | Status: DC | PRN
Start: 2020-08-13 — End: 2020-08-14

## 2020-08-13 MED ORDER — LIDOCAINE HCL (PF) 1 % IJ SOLN
INTRAMUSCULAR | Status: AC
  Filled 2020-08-13: qty 30

## 2020-08-13 MED ORDER — SODIUM CHLORIDE 0.9% FLUSH: 3.0000 mL | INTRAVENOUS | Status: DC | PRN

## 2020-08-13 MED ORDER — MIDAZOLAM HCL 2 MG/2ML IJ SOLN
INTRAMUSCULAR | Status: DC | PRN
  Administered 2020-08-13: 1 mg via INTRAVENOUS

## 2020-08-13 MED ORDER — NITROGLYCERIN 1 MG/10 ML FOR IR/CATH LAB
INTRA_ARTERIAL | Status: DC | PRN
  Administered 2020-08-13 (×2): 200 ug via INTRACORONARY

## 2020-08-13 MED ORDER — INSULIN ASPART 100 UNIT/ML ~~LOC~~ SOLN
SUBCUTANEOUS | Status: AC
  Filled 2020-08-13: qty 1

## 2020-08-13 MED ORDER — HEPARIN SODIUM (PORCINE) 5000 UNIT/ML IJ SOLN
5000.0000 [IU] | Freq: Three times a day (TID) | INTRAMUSCULAR | Status: DC
  Filled 2020-08-13: qty 1

## 2020-08-13 MED ORDER — IOHEXOL 350 MG/ML SOLN
INTRAVENOUS | Status: DC | PRN
  Administered 2020-08-13: 130 mL

## 2020-08-13 MED ORDER — FENTANYL CITRATE (PF) 100 MCG/2ML IJ SOLN
INTRAMUSCULAR | Status: AC
  Filled 2020-08-13: qty 2

## 2020-08-13 MED ORDER — INSULIN GLARGINE 100 UNIT/ML ~~LOC~~ SOLN
8.0000 [IU] | Freq: Two times a day (BID) | SUBCUTANEOUS | Status: DC
  Administered 2020-08-13 – 2020-08-14 (×3): 8 [IU] via SUBCUTANEOUS
  Filled 2020-08-13 (×6): qty 0.08

## 2020-08-13 MED ORDER — HEPARIN SODIUM (PORCINE) 1000 UNIT/ML IJ SOLN
INTRAMUSCULAR | Status: DC | PRN
  Administered 2020-08-13 (×2): 3500 [IU] via INTRAVENOUS
  Administered 2020-08-13: 2000 [IU] via INTRAVENOUS

## 2020-08-13 MED ORDER — LIDOCAINE HCL (PF) 1 % IJ SOLN
INTRAMUSCULAR | Status: DC | PRN
  Administered 2020-08-13: 2 mL

## 2020-08-13 MED ORDER — HEPARIN SODIUM (PORCINE) 1000 UNIT/ML IJ SOLN
INTRAMUSCULAR | Status: AC
  Filled 2020-08-13: qty 1

## 2020-08-13 MED ORDER — INSULIN GLARGINE 100 UNIT/ML ~~LOC~~ SOLN: 8.0000 [IU] | Freq: Two times a day (BID) | SUBCUTANEOUS | Status: DC

## 2020-08-13 MED ORDER — SODIUM CHLORIDE 0.9 % WEIGHT BASED INFUSION
1.0000 mL/kg/h | INTRAVENOUS | Status: DC
Start: 2020-08-13 — End: 2020-08-13

## 2020-08-13 MED ORDER — SODIUM CHLORIDE 0.9 % IV SOLN: 250.0000 mL | INTRAVENOUS | Status: DC | PRN

## 2020-08-13 MED ORDER — SODIUM CHLORIDE 0.9% FLUSH
3.0000 mL | Freq: Two times a day (BID) | INTRAVENOUS | Status: DC
  Administered 2020-08-13 – 2020-08-14 (×2): 3 mL via INTRAVENOUS

## 2020-08-13 MED ORDER — FAMOTIDINE IN NACL 20-0.9 MG/50ML-% IV SOLN
INTRAVENOUS | Status: AC | PRN
Start: 2020-08-13 — End: 2020-08-13
  Administered 2020-08-13: 20 mg via INTRAVENOUS

## 2020-08-13 MED ORDER — NITROGLYCERIN 1 MG/10 ML FOR IR/CATH LAB
INTRA_ARTERIAL | Status: AC
  Filled 2020-08-13: qty 10

## 2020-08-13 MED ORDER — HYDRALAZINE HCL 20 MG/ML IJ SOLN: 10.0000 mg | INTRAMUSCULAR | Status: AC | PRN

## 2020-08-13 MED ORDER — HEPARIN (PORCINE) IN NACL 1000-0.9 UT/500ML-% IV SOLN
INTRAVENOUS | Status: AC
  Filled 2020-08-13: qty 1000

## 2020-08-13 MED ORDER — SODIUM CHLORIDE 0.9% FLUSH
3.0000 mL | Freq: Two times a day (BID) | INTRAVENOUS | Status: DC
Start: 2020-08-13 — End: 2020-08-13
  Administered 2020-08-13: 3 mL via INTRAVENOUS

## 2020-08-13 MED ORDER — CLOPIDOGREL BISULFATE 75 MG PO TABS
75.0000 mg | ORAL_TABLET | Freq: Every day | ORAL | Status: DC
  Administered 2020-08-13 – 2020-08-14 (×2): 75 mg via ORAL
  Filled 2020-08-13 (×2): qty 1

## 2020-08-13 MED ORDER — SODIUM CHLORIDE 0.9 % WEIGHT BASED INFUSION
3.0000 mL/kg/h | INTRAVENOUS | Status: DC
  Administered 2020-08-13: 3 mL/kg/h via INTRAVENOUS

## 2020-08-13 MED ORDER — SODIUM CHLORIDE 0.9 % IV SOLN: INTRAVENOUS | Status: AC

## 2020-08-13 MED ORDER — ASPIRIN 81 MG PO CHEW
CHEWABLE_TABLET | ORAL | Status: AC
  Filled 2020-08-13: qty 1

## 2020-08-13 MED ORDER — CLOPIDOGREL BISULFATE 300 MG PO TABS
ORAL_TABLET | ORAL | Status: AC
  Filled 2020-08-13: qty 1

## 2020-08-13 MED ORDER — INSULIN ASPART 100 UNIT/ML ~~LOC~~ SOLN
0.0000 [IU] | SUBCUTANEOUS | Status: DC
  Administered 2020-08-13 (×2): 5 [IU] via SUBCUTANEOUS
  Administered 2020-08-13: 1 [IU] via SUBCUTANEOUS
  Administered 2020-08-14: 3 [IU] via SUBCUTANEOUS
  Administered 2020-08-14 (×2): 1 [IU] via SUBCUTANEOUS

## 2020-08-13 MED ORDER — VERAPAMIL HCL 2.5 MG/ML IV SOLN
INTRAVENOUS | Status: DC | PRN
  Administered 2020-08-13: 10 mL via INTRA_ARTERIAL

## 2020-08-13 MED ORDER — FAMOTIDINE IN NACL 20-0.9 MG/50ML-% IV SOLN
INTRAVENOUS | Status: AC
  Filled 2020-08-13: qty 50

## 2020-08-13 MED ORDER — MIDAZOLAM HCL 2 MG/2ML IJ SOLN
INTRAMUSCULAR | Status: AC
  Filled 2020-08-13: qty 2

## 2020-08-13 MED ORDER — FENTANYL CITRATE (PF) 100 MCG/2ML IJ SOLN
INTRAMUSCULAR | Status: DC | PRN
  Administered 2020-08-13: 25 ug via INTRAVENOUS

## 2020-08-13 SURGICAL SUPPLY — 18 items
BALLN SAPPHIRE 2.5X12 (BALLOONS) ×2
BALLN SAPPHIRE ~~LOC~~ 3.0X12 (BALLOONS) ×2 IMPLANT
BALLOON SAPPHIRE 2.5X12 (BALLOONS) ×1 IMPLANT
CATH IMPULSE 5F ANG/FL3.5 (CATHETERS) ×2 IMPLANT
CATH VISTA GUIDE 6FR XBLAD3.0 (CATHETERS) ×2 IMPLANT
DEVICE RAD COMP TR BAND LRG (VASCULAR PRODUCTS) ×2 IMPLANT
GLIDESHEATH SLEND SS 6F .021 (SHEATH) ×2 IMPLANT
GUIDEWIRE INQWIRE 1.5J.035X260 (WIRE) ×1 IMPLANT
GUIDEWIRE PRESSURE COMET II (WIRE) ×2 IMPLANT
INQWIRE 1.5J .035X260CM (WIRE) ×2
KIT ENCORE 26 ADVANTAGE (KITS) ×2 IMPLANT
KIT ESSENTIALS PG (KITS) ×2 IMPLANT
KIT HEART LEFT (KITS) ×2 IMPLANT
PACK CARDIAC CATHETERIZATION (CUSTOM PROCEDURE TRAY) ×2 IMPLANT
STENT RESOLUTE ONYX 2.75X26 (Permanent Stent) ×2 IMPLANT
TRANSDUCER W/STOPCOCK (MISCELLANEOUS) ×2 IMPLANT
TUBING CIL FLEX 10 FLL-RA (TUBING) ×2 IMPLANT
WIRE HI TORQ BMW 190CM (WIRE) ×2 IMPLANT

## 2020-08-13 NOTE — Progress Notes (Signed)
Progress Note  Patient Name: [name] [name] Date of Encounter: 08/13/2020  [facility] Cardiologist: Lance Muss, MD   Subjective   Patient still with active chest pain.  It is atypical.  Several components including one wirse with palpation.   Inpatient Medications    Scheduled Meds: . clopidogrel  75 mg Oral Daily  . folic acid  1,000 mcg Oral Daily  . gabapentin  100 mg Oral QHS  . heparin  5,000 Units Subcutaneous Q8H  . insulin aspart  0-5 Units Subcutaneous QHS  . insulin aspart  0-9 Units Subcutaneous TID WC  . insulin glargine  5 Units Subcutaneous QHS  . metoprolol tartrate  25 mg Oral BID  . pantoprazole  40 mg Oral BID  . prednisoLONE acetate  4-5 drop Left Eye Daily  . rosuvastatin  40 mg Oral Daily  . sertraline  150 mg Oral Daily  . sodium chloride flush  3 mL Intravenous Q12H   Continuous Infusions: . sodium chloride     Followed by  . sodium chloride     PRN Meds: acetaminophen, albuterol, cyclobenzaprine, nitroGLYCERIN, ondansetron (ZOFRAN) IV, traMADol   Vital Signs    Vitals:   08/12/20 1634 08/12/20 2035 08/13/20 0311 08/13/20 0808  BP: 133/77 (!) 112/56 (!) 101/59 (!) 118/57  Pulse: 89 84 70 75  Resp: 20 18 18 20   Temp: 98.3 F (36.8 C) 98.3 F (36.8 C) 98.1 F (36.7 C) 98.1 F (36.7 C)  TempSrc: Oral Oral Oral Oral  SpO2: 100% 92% 92% 99%  Weight: 70.4 kg     Height: 5' (1.524 m)       Intake/Output Summary (Last 24 hours) at 08/13/2020 0924 Last data filed at 08/12/2020 1850 Gross per 24 hour  Intake 480 ml  Output -  Net 480 ml   Last 3 Weights 08/12/2020 08/12/2020 07/10/2020  Weight (lbs) 155 lb 3.3 oz 164 lb 12.8 oz 150 lb  Weight (kg) 70.4 kg 74.753 kg 68.04 kg      Telemetry    NSR - Personally Reviewed  ECG    NSR, inferolateral ST depression - Personally Reviewed  Physical Exam   GEN: No acute distress.   Neck: No JVD Cardiac: RRR, no murmurs, rubs, or gallops.  Respiratory: Clear to auscultation  bilaterally. GI: Soft, nontender, non-distended  MS: No edema; No deformity. Neuro:  Nonfocal  Psych: Normal affect   Labs    Frankl Sensitivity Troponin:   Recent Labs  Lab 08/12/20 1037 08/12/20 1230  TROPONINIHS 7 8      Chemistry Recent Labs  Lab 08/12/20 1037 08/12/20 1657 08/13/20 0428  NA 137  --  135  K 3.9  --  4.4  CL 101  --  100  CO2 23  --  22  GLUCOSE 119*  --  279*  BUN 26*  --  30*  CREATININE 1.04* 1.18* 1.17*  CALCIUM 10.1  --  9.8  GFRNONAA >60 55* 56*  ANIONGAP 13  --  13     Hematology Recent Labs  Lab 08/12/20 1037 08/12/20 1657  WBC 10.1 8.6  RBC 4.14 3.96  HGB 12.7 12.0  HCT 38.4 35.9*  MCV 92.8 90.7  MCH 30.7 30.3  MCHC 33.1 33.4  RDW 17.7* 17.3*  PLT 282 281    BNPNo results for input(s): BNP, PROBNP in the last 168 hours.   DDimer  Recent Labs  Lab 08/12/20 1657  DDIMER 3.89*     Radiology  DG Chest 2 View  Result Date: 08/12/2020 CLINICAL DATA:  Chest pain and short of breath 1 week EXAM: CHEST - 2 VIEW COMPARISON:  03/25/2020 FINDINGS: The heart size and mediastinal contours are within normal limits. Right coronary stent. Both lungs are clear. The visualized skeletal structures are unremarkable. IMPRESSION: No active cardiopulmonary disease. Electronically Signed   By: Marlan Palau M.D.   On: 08/12/2020 10:48   ECHOCARDIOGRAM COMPLETE  Result Date: 08/12/2020    ECHOCARDIOGRAM REPORT   Patient Name:   Liadan Labo   Date of Exam: 08/12/2020 Medical Rec #:  [id number]  Height:       60.0 in Accession #:    [id number] Weight:       164.8 lb Date of Birth:  06-14-1966  BSA:          1.719 m Patient Age:    54 years   BP:           108/62 mmHg Patient Gender: F          HR:           82 bpm. Exam Location:  Inpatient Procedure: 2D Echo Indications:    Chest Pain R07.9  History:        Patient has prior history of Echocardiogram examinations, most                 recent 06/16/2018. CAD and Previous Myocardial Infarction; Risk                  Factors:Diabetes, Hypertension and Dyslipidemia.  Sonographer:    Thurman Coyer RDCS (AE) Referring Phys: [id number] CALLIE E GOODRICH IMPRESSIONS  1. Left ventricular ejection fraction, by estimation, is 55 to 60%. The left ventricle has normal function. The left ventricle has no regional wall motion abnormalities. Left ventricular diastolic parameters were normal.  2. Right ventricular systolic function is normal. The right ventricular size is mildly enlarged. Tricuspid regurgitation signal is inadequate for assessing PA pressure.  3. Left atrial size was mildly dilated.  4. Right atrial size was mildly dilated.  5. The mitral valve is normal in structure. Trivial mitral valve regurgitation.  6. The aortic valve is tricuspid. Aortic valve regurgitation is not visualized. No aortic stenosis is present.  7. The inferior vena cava is normal in size with <50% respiratory variability, suggesting right atrial pressure of 8 mmHg. FINDINGS  Left Ventricle: Left ventricular ejection fraction, by estimation, is 55 to 60%. The left ventricle has normal function. The left ventricle has no regional wall motion abnormalities. The left ventricular internal cavity size was normal in size. There is  no left ventricular hypertrophy. Left ventricular diastolic parameters were normal. Right Ventricle: The right ventricular size is mildly enlarged. No increase in right ventricular wall thickness. Right ventricular systolic function is normal. Tricuspid regurgitation signal is inadequate for assessing PA pressure. Left Atrium: Left atrial size was mildly dilated. Right Atrium: Right atrial size was mildly dilated. Pericardium: There is no evidence of pericardial effusion. Mitral Valve: The mitral valve is normal in structure. Trivial mitral valve regurgitation. Tricuspid Valve: The tricuspid valve is normal in structure. Tricuspid valve regurgitation is trivial. Aortic Valve: The aortic valve is tricuspid. Aortic valve  regurgitation is not visualized. No aortic stenosis is present. Pulmonic Valve: The pulmonic valve was grossly normal. Pulmonic valve regurgitation is not visualized. Aorta: The aortic root is normal in size and structure. Venous: The inferior vena cava is normal in size with less than 50% respiratory  variability, suggesting right atrial pressure of 8 mmHg. IAS/Shunts: The interatrial septum was not well visualized.  LEFT VENTRICLE PLAX 2D LVIDd:         4.40 cm  Diastology LVIDs:         3.40 cm  LV e' medial:    6.92 cm/s LV PW:         0.90 cm  LV E/e' medial:  11.1 LV IVS:        0.90 cm  LV e' lateral:   9.64 cm/s LVOT diam:     1.90 cm  LV E/e' lateral: 8.0 LV SV:         70 LV SV Index:   41 LVOT Area:     2.84 cm  RIGHT VENTRICLE RV S prime:     10.30 cm/s TAPSE (M-mode): 2.1 cm LEFT ATRIUM             Index       RIGHT ATRIUM           Index LA diam:        3.50 cm 2.04 cm/m  RA Area:     18.60 cm LA Vol (A2C):   55.0 ml 31.99 ml/m RA Volume:   52.10 ml  30.31 ml/m LA Vol (A4C):   65.7 ml 38.22 ml/m LA Biplane Vol: 65.6 ml 38.16 ml/m  AORTIC VALVE LVOT Vmax:   102.00 cm/s LVOT Vmean:  70.000 cm/s LVOT VTI:    0.248 m  AORTA Ao Root diam: 2.70 cm MITRAL VALVE MV Area (PHT): 4.60 cm    SHUNTS MV Decel Time: 165 msec    Systemic VTI:  0.25 m MV E velocity: 77.10 cm/s  Systemic Diam: 1.90 cm MV A velocity: 81.40 cm/s MV E/A ratio:  0.95 Epifanio Lesches MD Electronically signed by Epifanio Lesches MD Signature Date/Time: 08/12/2020/6:38:57 PM    Final     Cardiac Studies   Prior severe CAD noted on cath.  Stents in RCA which have restenosed.  Occluded RCA noted in the past.  PCI of circ done as well many years ago.  No cath since that time.   2014 cath: 1.  "Normal left main coronary artery. 2. No significant disease in the left anterior descending artery and its branches. 3. 90% lesion in the mid left circumflex artery.  This appears to be a ruptured plaque and was successfully stented  with a 2.75 x 23 drug-eluting stent, postdilated to 3.3 mm in diameter. 4. Occluded mid right coronary artery, which appears chronic.  The entire distal stented segment appears occluded.  The distal RCA system has faint collaterals from the left system. 5. Mildly decreased left ventricular systolic function.  LVEDP 30 mmHg.  Ejection fraction 40-45 %."  Patient Profile     55 y.o. female with CAD  Assessment & Plan    CAD- unstable angina: Known CAD.  Atypical chest pain which is persistent.  SHe is compliant with her clopidogrel monotherapy. Plan for cath today.  Procedure explained to the patient.  All questions answered.   DM: A1C well controlled.   For questions or updates, please contact [facility] Please consult [email or web address] for contact info under        Signed, Lance Muss, MD  08/13/2020, 9:24 AM

## 2020-08-13 NOTE — Progress Notes (Signed)
Inpatient Diabetes Program Recommendations  AACE/ADA: New Consensus Statement on Inpatient Glycemic Control (2015)  Target Ranges:  Prepandial:   less than 140 mg/dL      Peak postprandial:   less than 180 mg/dL (1-2 hours)      Critically ill patients:  140 - 180 mg/dL   Lab Results  Component Value Date   GLUCAP 254 (H) 08/13/2020   HGBA1C 6.7 (H) 08/12/2020    Review of Glycemic Control Results for [name] [name], [name] [name] (MRN [id number]) as of 08/13/2020 08:58  Ref. Range 08/12/2020 10:24 08/12/2020 16:37 08/12/2020 21:09 08/12/2020 21:56 08/13/2020 05:54  Glucose-Capillary Latest Ref Range: 70 - 99 mg/dL 638 (H) 937 (H) 342 (H) 438 (H) 254 (H)   Diabetes history: DM 1- Dr. Sharl Ma Outpatient Diabetes medications:  Lantus 10 units bid Novolog 7 units with breakfast and lunch, and 6 units with evening meal Current orders for Inpatient glycemic control:  Novolog sensitive tid with meals and HS Lantus 5 units q HS  Inpatient Diabetes Program Recommendations:   Please consider increasing Lantus to 8 units bid and change Novolog correction to "very sensitive" 0-6 units q 4 hours. Once eating she will need Novolog meal coverage 3 units tid with meals (hold if patient eats less than 50% or NPO).   Thanks  Beryl Meager, RN, BC-ADM Inpatient Diabetes Coordinator Pager [phone] (8a-5p)

## 2020-08-13 NOTE — Progress Notes (Signed)
MD notified of CBG of 438 at 2200.  New order for HS sliding scale.  5U given.

## 2020-08-13 NOTE — H&P (View-Only) (Signed)
Progress Note  Patient Name: [name] [name] Date of Encounter: 08/13/2020  [facility] Cardiologist: Lance Muss, MD   Subjective   Patient still with active chest pain.  It is atypical.  Several components including one wirse with palpation.   Inpatient Medications    Scheduled Meds: . clopidogrel  75 mg Oral Daily  . folic acid  1,000 mcg Oral Daily  . gabapentin  100 mg Oral QHS  . heparin  5,000 Units Subcutaneous Q8H  . insulin aspart  0-5 Units Subcutaneous QHS  . insulin aspart  0-9 Units Subcutaneous TID WC  . insulin glargine  5 Units Subcutaneous QHS  . metoprolol tartrate  25 mg Oral BID  . pantoprazole  40 mg Oral BID  . prednisoLONE acetate  4-5 drop Left Eye Daily  . rosuvastatin  40 mg Oral Daily  . sertraline  150 mg Oral Daily  . sodium chloride flush  3 mL Intravenous Q12H   Continuous Infusions: . sodium chloride     Followed by  . sodium chloride     PRN Meds: acetaminophen, albuterol, cyclobenzaprine, nitroGLYCERIN, ondansetron (ZOFRAN) IV, traMADol   Vital Signs    Vitals:   08/12/20 1634 08/12/20 2035 08/13/20 0311 08/13/20 0808  BP: 133/77 (!) 112/56 (!) 101/59 (!) 118/57  Pulse: 89 84 70 75  Resp: 20 18 18 20   Temp: 98.3 F (36.8 C) 98.3 F (36.8 C) 98.1 F (36.7 C) 98.1 F (36.7 C)  TempSrc: Oral Oral Oral Oral  SpO2: 100% 92% 92% 99%  Weight: 70.4 kg     Height: 5' (1.524 m)       Intake/Output Summary (Last 24 hours) at 08/13/2020 0924 Last data filed at 08/12/2020 1850 Gross per 24 hour  Intake 480 ml  Output -  Net 480 ml   Last 3 Weights 08/12/2020 08/12/2020 07/10/2020  Weight (lbs) 155 lb 3.3 oz 164 lb 12.8 oz 150 lb  Weight (kg) 70.4 kg 74.753 kg 68.04 kg      Telemetry    NSR - Personally Reviewed  ECG    NSR, inferolateral ST depression - Personally Reviewed  Physical Exam   GEN: No acute distress.   Neck: No JVD Cardiac: RRR, no murmurs, rubs, or gallops.  Respiratory: Clear to auscultation  bilaterally. GI: Soft, nontender, non-distended  MS: No edema; No deformity. Neuro:  Nonfocal  Psych: Normal affect   Labs    Frankl Sensitivity Troponin:   Recent Labs  Lab 08/12/20 1037 08/12/20 1230  TROPONINIHS 7 8      Chemistry Recent Labs  Lab 08/12/20 1037 08/12/20 1657 08/13/20 0428  NA 137  --  135  K 3.9  --  4.4  CL 101  --  100  CO2 23  --  22  GLUCOSE 119*  --  279*  BUN 26*  --  30*  CREATININE 1.04* 1.18* 1.17*  CALCIUM 10.1  --  9.8  GFRNONAA >60 55* 56*  ANIONGAP 13  --  13     Hematology Recent Labs  Lab 08/12/20 1037 08/12/20 1657  WBC 10.1 8.6  RBC 4.14 3.96  HGB 12.7 12.0  HCT 38.4 35.9*  MCV 92.8 90.7  MCH 30.7 30.3  MCHC 33.1 33.4  RDW 17.7* 17.3*  PLT 282 281    BNPNo results for input(s): BNP, PROBNP in the last 168 hours.   DDimer  Recent Labs  Lab 08/12/20 1657  DDIMER 3.89*     Radiology  DG Chest 2 View  Result Date: 08/12/2020 CLINICAL DATA:  Chest pain and short of breath 1 week EXAM: CHEST - 2 VIEW COMPARISON:  03/25/2020 FINDINGS: The heart size and mediastinal contours are within normal limits. Right coronary stent. Both lungs are clear. The visualized skeletal structures are unremarkable. IMPRESSION: No active cardiopulmonary disease. Electronically Signed   By: Marlan Palau M.D.   On: 08/12/2020 10:48   ECHOCARDIOGRAM COMPLETE  Result Date: 08/12/2020    ECHOCARDIOGRAM REPORT   Patient Name:   [name] [name]   Date of Exam: 08/12/2020 Medical Rec #:  [id number]  Height:       60.0 in Accession #:    [id number] Weight:       164.8 lb Date of Birth:  06-14-1966  BSA:          1.719 m Patient Age:    54 years   BP:           108/62 mmHg Patient Gender: F          HR:           82 bpm. Exam Location:  Inpatient Procedure: 2D Echo Indications:    Chest Pain R07.9  History:        Patient has prior history of Echocardiogram examinations, most                 recent 06/16/2018. CAD and Previous Myocardial Infarction; Risk                  Factors:Diabetes, Hypertension and Dyslipidemia.  Sonographer:    Thurman Coyer RDCS (AE) Referring Phys: [id number] CALLIE E GOODRICH IMPRESSIONS  1. Left ventricular ejection fraction, by estimation, is 55 to 60%. The left ventricle has normal function. The left ventricle has no regional wall motion abnormalities. Left ventricular diastolic parameters were normal.  2. Right ventricular systolic function is normal. The right ventricular size is mildly enlarged. Tricuspid regurgitation signal is inadequate for assessing PA pressure.  3. Left atrial size was mildly dilated.  4. Right atrial size was mildly dilated.  5. The mitral valve is normal in structure. Trivial mitral valve regurgitation.  6. The aortic valve is tricuspid. Aortic valve regurgitation is not visualized. No aortic stenosis is present.  7. The inferior vena cava is normal in size with <50% respiratory variability, suggesting right atrial pressure of 8 mmHg. FINDINGS  Left Ventricle: Left ventricular ejection fraction, by estimation, is 55 to 60%. The left ventricle has normal function. The left ventricle has no regional wall motion abnormalities. The left ventricular internal cavity size was normal in size. There is  no left ventricular hypertrophy. Left ventricular diastolic parameters were normal. Right Ventricle: The right ventricular size is mildly enlarged. No increase in right ventricular wall thickness. Right ventricular systolic function is normal. Tricuspid regurgitation signal is inadequate for assessing PA pressure. Left Atrium: Left atrial size was mildly dilated. Right Atrium: Right atrial size was mildly dilated. Pericardium: There is no evidence of pericardial effusion. Mitral Valve: The mitral valve is normal in structure. Trivial mitral valve regurgitation. Tricuspid Valve: The tricuspid valve is normal in structure. Tricuspid valve regurgitation is trivial. Aortic Valve: The aortic valve is tricuspid. Aortic valve  regurgitation is not visualized. No aortic stenosis is present. Pulmonic Valve: The pulmonic valve was grossly normal. Pulmonic valve regurgitation is not visualized. Aorta: The aortic root is normal in size and structure. Venous: The inferior vena cava is normal in size with less than 50% respiratory  variability, suggesting right atrial pressure of 8 mmHg. IAS/Shunts: The interatrial septum was not well visualized.  LEFT VENTRICLE PLAX 2D LVIDd:         4.40 cm  Diastology LVIDs:         3.40 cm  LV e' medial:    6.92 cm/s LV PW:         0.90 cm  LV E/e' medial:  11.1 LV IVS:        0.90 cm  LV e' lateral:   9.64 cm/s LVOT diam:     1.90 cm  LV E/e' lateral: 8.0 LV SV:         70 LV SV Index:   41 LVOT Area:     2.84 cm  RIGHT VENTRICLE RV S prime:     10.30 cm/s TAPSE (M-mode): 2.1 cm LEFT ATRIUM             Index       RIGHT ATRIUM           Index LA diam:        3.50 cm 2.04 cm/m  RA Area:     18.60 cm LA Vol (A2C):   55.0 ml 31.99 ml/m RA Volume:   52.10 ml  30.31 ml/m LA Vol (A4C):   65.7 ml 38.22 ml/m LA Biplane Vol: 65.6 ml 38.16 ml/m  AORTIC VALVE LVOT Vmax:   102.00 cm/s LVOT Vmean:  70.000 cm/s LVOT VTI:    0.248 m  AORTA Ao Root diam: 2.70 cm MITRAL VALVE MV Area (PHT): 4.60 cm    SHUNTS MV Decel Time: 165 msec    Systemic VTI:  0.25 m MV E velocity: 77.10 cm/s  Systemic Diam: 1.90 cm MV A velocity: 81.40 cm/s MV E/A ratio:  0.95 Epifanio Lesches MD Electronically signed by Epifanio Lesches MD Signature Date/Time: 08/12/2020/6:38:57 PM    Final     Cardiac Studies   Prior severe CAD noted on cath.  Stents in RCA which have restenosed.  Occluded RCA noted in the past.  PCI of circ done as well many years ago.  No cath since that time.   2014 cath: 1.  "Normal left main coronary artery. 2. No significant disease in the left anterior descending artery and its branches. 3. 90% lesion in the mid left circumflex artery.  This appears to be a ruptured plaque and was successfully stented  with a 2.75 x 23 drug-eluting stent, postdilated to 3.3 mm in diameter. 4. Occluded mid right coronary artery, which appears chronic.  The entire distal stented segment appears occluded.  The distal RCA system has faint collaterals from the left system. 5. Mildly decreased left ventricular systolic function.  LVEDP 30 mmHg.  Ejection fraction 40-45 %."  Patient Profile     55 y.o. female with CAD  Assessment & Plan    CAD- unstable angina: Known CAD.  Atypical chest pain which is persistent.  SHe is compliant with her clopidogrel monotherapy. Plan for cath today.  Procedure explained to the patient.  All questions answered.   DM: A1C well controlled.   For questions or updates, please contact [facility] Please consult [email or web address] for contact info under        Signed, Lance Muss, MD  08/13/2020, 9:24 AM

## 2020-08-13 NOTE — Progress Notes (Signed)
Patient with cp rating it 8/10, one sl nitro given, pain now 3/10 per patient. PA Baget made aware.  Patient transported to cath lab.

## 2020-08-13 NOTE — Interval H&P Note (Signed)
History and Physical Interval Note:  08/13/2020 10:29 AM  [name] [name]  has presented today for surgery, with the diagnosis of unstable angina.  The various methods of treatment have been discussed with the patient and family. After consideration of risks, benefits and other options for treatment, the patient has consented to  Procedure(s): LEFT HEART CATH AND CORONARY ANGIOGRAPHY (N/A) as a surgical intervention.  The patient's history has been reviewed, patient examined, no change in status, stable for surgery.  I have reviewed the patient's chart and labs.  Questions were answered to the patient's satisfaction.    Cath Lab Visit (complete for each Cath Lab visit)  Clinical Evaluation Leading to the Procedure:   ACS: Yes.    Non-ACS:  N/A  Adyline Huberty

## 2020-08-14 DIAGNOSIS — I200 Unstable angina: Secondary | ICD-10-CM

## 2020-08-14 DIAGNOSIS — K922 Gastrointestinal hemorrhage, unspecified: Secondary | ICD-10-CM

## 2020-08-14 LAB — CBC
HCT: 36.3 % (ref 36.0–46.0)
Hemoglobin: 12 g/dL (ref 12.0–15.0)
MCH: 30.5 pg (ref 26.0–34.0)
MCHC: 33.1 g/dL (ref 30.0–36.0)
MCV: 92.4 fL (ref 80.0–100.0)
Platelets: 268 10*3/uL (ref 150–400)
RBC: 3.93 MIL/uL (ref 3.87–5.11)
RDW: 17.2 % — ABNORMAL HIGH (ref 11.5–15.5)
WBC: 6.2 10*3/uL (ref 4.0–10.5)
nRBC: 0 % (ref 0.0–0.2)

## 2020-08-14 LAB — BASIC METABOLIC PANEL
Anion gap: 9 (ref 5–15)
BUN: 19 mg/dL (ref 6–20)
CO2: 25 mmol/L (ref 22–32)
Calcium: 9.5 mg/dL (ref 8.9–10.3)
Chloride: 105 mmol/L (ref 98–111)
Creatinine, Ser: 1.15 mg/dL — ABNORMAL HIGH (ref 0.44–1.00)
GFR, Estimated: 57 mL/min — ABNORMAL LOW (ref >60)
Glucose, Bld: 199 mg/dL — ABNORMAL HIGH (ref 70–99)
Potassium: 4.1 mmol/L (ref 3.5–5.1)
Sodium: 139 mmol/L (ref 135–145)

## 2020-08-14 LAB — GLUCOSE, CAPILLARY
Glucose-Capillary: 253 mg/dL — ABNORMAL HIGH (ref 70–99)
Glucose-Capillary: 151 mg/dL — ABNORMAL HIGH (ref 70–99)

## 2020-08-14 MED ORDER — ASPIRIN 81 MG PO CHEW: 81.0000 mg | CHEWABLE_TABLET | Freq: Every day | ORAL | 1 refills | Status: DC

## 2020-08-14 NOTE — Progress Notes (Signed)
Progress Note  Patient Name: [name] [name] Date of Encounter: 08/14/2020  [facility] Cardiologist: Lance Muss, MD   Subjective   Feeling improved after stent placement.  Ready to return home.  Inpatient Medications    Scheduled Meds: . aspirin  81 mg Oral Daily  . clopidogrel  75 mg Oral Daily  . folic acid  1,000 mcg Oral Daily  . gabapentin  100 mg Oral QHS  . heparin  5,000 Units Subcutaneous Q8H  . insulin aspart  0-6 Units Subcutaneous Q4H  . insulin glargine  8 Units Subcutaneous BID  . metoprolol tartrate  25 mg Oral BID  . pantoprazole  40 mg Oral BID  . prednisoLONE acetate  4-5 drop Left Eye Daily  . rosuvastatin  40 mg Oral Daily  . sertraline  150 mg Oral Daily  . sodium chloride flush  3 mL Intravenous Q12H   Continuous Infusions: . sodium chloride     PRN Meds: sodium chloride, acetaminophen, albuterol, cyclobenzaprine, nitroGLYCERIN, ondansetron (ZOFRAN) IV, sodium chloride flush, traMADol   Vital Signs    Vitals:   08/13/20 1936 08/13/20 1941 08/14/20 0051 08/14/20 0428  BP:  117/65 115/61 90/60  Pulse: 76 77 82 75  Resp:  18  18  Temp:  98.5 F (36.9 C) 98 F (36.7 C) 97.7 F (36.5 C)  TempSrc:  Oral Oral Oral  SpO2: 97% 98% 94% 91%  Weight:    70.9 kg  Height:        Intake/Output Summary (Last 24 hours) at 08/14/2020 0910 Last data filed at 08/14/2020 0600 Gross per 24 hour  Intake 470 ml  Output 300 ml  Net 170 ml   Last 3 Weights 08/14/2020 08/12/2020 08/12/2020  Weight (lbs) 156 lb 3.2 oz 155 lb 3.3 oz 164 lb 12.8 oz  Weight (kg) 70.852 kg 70.4 kg 74.753 kg      Telemetry    Sinus rhythm-personally reviewed  ECG    Sinus rhythm- personally reviewed  Physical Exam   GEN: Well nourished, well developed, in no acute distress  HEENT: normal  Neck: no JVD, carotid bruits, or masses Cardiac: RRR; no murmurs, rubs, or gallops,no edema  Respiratory:  clear to auscultation bilaterally, normal work of breathing GI: soft,  nontender, nondistended, + BS MS: no deformity or atrophy  Skin: warm and dry Neuro:  Strength and sensation are intact Psych: euthymic mood, full affect   Labs    [place] Sensitivity Troponin:   Recent Labs  Lab 08/12/20 1037 08/12/20 1230  TROPONINIHS 7 8      Chemistry Recent Labs  Lab 08/12/20 1037 08/12/20 1657 08/13/20 0428 08/14/20 0308  NA 137  --  135 139  K 3.9  --  4.4 4.1  CL 101  --  100 105  CO2 23  --  22 25  GLUCOSE 119*  --  279* 199*  BUN 26*  --  30* 19  CREATININE 1.04* 1.18* 1.17* 1.15*  CALCIUM 10.1  --  9.8 9.5  GFRNONAA >60 55* 56* 57*  ANIONGAP 13  --  13 9     Hematology Recent Labs  Lab 08/12/20 1037 08/12/20 1657 08/14/20 0308  WBC 10.1 8.6 6.2  RBC 4.14 3.96 3.93  HGB 12.7 12.0 12.0  HCT 38.4 35.9* 36.3  MCV 92.8 90.7 92.4  MCH 30.7 30.3 30.5  MCHC 33.1 33.4 33.1  RDW 17.7* 17.3* 17.2*  PLT 282 281 268    BNPNo results for input(s): BNP, PROBNP  in the last 168 hours.   DDimer  Recent Labs  Lab 08/12/20 1657  DDIMER 3.89*     Radiology    DG Chest 2 View  Result Date: 08/12/2020 CLINICAL DATA:  Chest pain and short of breath 1 week EXAM: CHEST - 2 VIEW COMPARISON:  03/25/2020 FINDINGS: The heart size and mediastinal contours are within normal limits. Right coronary stent. Both lungs are clear. The visualized skeletal structures are unremarkable. IMPRESSION: No active cardiopulmonary disease. Electronically Signed   By: Marlan Palau M.D.   On: 08/12/2020 10:48   CARDIAC CATHETERIZATION  Result Date: 08/13/2020 Conclusions: 1. Multivessel coronary artery disease, including sequential 60-70% and 20-30% mid LAD stenoses (DFR significant @ 0.85), 40% mid LCx lesion, and chronic total occlusion of mid/distal RCA.  The distal RCA branches fill via faint left-to-right collaterals. 2. Widely patent mid/distal LCx stent. 3. Severe in-stent restenosis of RCA with 70% proximal disease and chronic total occlusion of the mid and  distal segments. 4. Normal left ventricular filling pressure. 5. Successful DFR-guided PCI to mid LAD using Resolute Onyx 2.75 x 26 mm drug eluting stent (postdilated to 3.1 mm) with 10% residual stenosis and TIMI-3 flow. Recommendations: 1. Dual antiplatelet therapy with aspirin and clopidogrel for up to 1 month (if tolerated) followed by indefinite clopidogrel 75 mg daily. 2. Aggressive secondary prevention of coronary artery disease. Yvonne Kendall, MD [facility]   ECHOCARDIOGRAM COMPLETE  Result Date: 08/12/2020    ECHOCARDIOGRAM REPORT   Patient Name:   [name] [name]   Date of Exam: 08/12/2020 Medical Rec #:  [id number]  Height:       60.0 in Accession #:    [id number] Weight:       164.8 lb Date of Birth:  1966/02/01  BSA:          1.719 m Patient Age:    54 years   BP:           108/62 mmHg Patient Gender: F          HR:           82 bpm. Exam Location:  Inpatient Procedure: 2D Echo Indications:    Chest Pain R07.9  History:        Patient has prior history of Echocardiogram examinations, most                 recent 06/16/2018. CAD and Previous Myocardial Infarction; Risk                 Factors:Diabetes, Hypertension and Dyslipidemia.  Sonographer:    Thurman Coyer RDCS (AE) Referring Phys: [id number] CALLIE E GOODRICH IMPRESSIONS  1. Left ventricular ejection fraction, by estimation, is 55 to 60%. The left ventricle has normal function. The left ventricle has no regional wall motion abnormalities. Left ventricular diastolic parameters were normal.  2. Right ventricular systolic function is normal. The right ventricular size is mildly enlarged. Tricuspid regurgitation signal is inadequate for assessing PA pressure.  3. Left atrial size was mildly dilated.  4. Right atrial size was mildly dilated.  5. The mitral valve is normal in structure. Trivial mitral valve regurgitation.  6. The aortic valve is tricuspid. Aortic valve regurgitation is not visualized. No aortic stenosis is present.  7. The inferior  vena cava is normal in size with <50% respiratory variability, suggesting right atrial pressure of 8 mmHg. FINDINGS  Left Ventricle: Left ventricular ejection fraction, by estimation, is 55 to 60%. The left ventricle has normal function. The  left ventricle has no regional wall motion abnormalities. The left ventricular internal cavity size was normal in size. There is  no left ventricular hypertrophy. Left ventricular diastolic parameters were normal. Right Ventricle: The right ventricular size is mildly enlarged. No increase in right ventricular wall thickness. Right ventricular systolic function is normal. Tricuspid regurgitation signal is inadequate for assessing PA pressure. Left Atrium: Left atrial size was mildly dilated. Right Atrium: Right atrial size was mildly dilated. Pericardium: There is no evidence of pericardial effusion. Mitral Valve: The mitral valve is normal in structure. Trivial mitral valve regurgitation. Tricuspid Valve: The tricuspid valve is normal in structure. Tricuspid valve regurgitation is trivial. Aortic Valve: The aortic valve is tricuspid. Aortic valve regurgitation is not visualized. No aortic stenosis is present. Pulmonic Valve: The pulmonic valve was grossly normal. Pulmonic valve regurgitation is not visualized. Aorta: The aortic root is normal in size and structure. Venous: The inferior vena cava is normal in size with less than 50% respiratory variability, suggesting right atrial pressure of 8 mmHg. IAS/Shunts: The interatrial septum was not well visualized.  LEFT VENTRICLE PLAX 2D LVIDd:         4.40 cm  Diastology LVIDs:         3.40 cm  LV e' medial:    6.92 cm/s LV PW:         0.90 cm  LV E/e' medial:  11.1 LV IVS:        0.90 cm  LV e' lateral:   9.64 cm/s LVOT diam:     1.90 cm  LV E/e' lateral: 8.0 LV SV:         70 LV SV Index:   41 LVOT Area:     2.84 cm  RIGHT VENTRICLE RV S prime:     10.30 cm/s TAPSE (M-mode): 2.1 cm LEFT ATRIUM             Index       RIGHT ATRIUM            Index LA diam:        3.50 cm 2.04 cm/m  RA Area:     18.60 cm LA Vol (A2C):   55.0 ml 31.99 ml/m RA Volume:   52.10 ml  30.31 ml/m LA Vol (A4C):   65.7 ml 38.22 ml/m LA Biplane Vol: 65.6 ml 38.16 ml/m  AORTIC VALVE LVOT Vmax:   102.00 cm/s LVOT Vmean:  70.000 cm/s LVOT VTI:    0.248 m  AORTA Ao Root diam: 2.70 cm MITRAL VALVE MV Area (PHT): 4.60 cm    SHUNTS MV Decel Time: 165 msec    Systemic VTI:  0.25 m MV E velocity: 77.10 cm/s  Systemic Diam: 1.90 cm MV A velocity: 81.40 cm/s MV E/A ratio:  0.95 Epifanio Lesches MD Electronically signed by Epifanio Lesches MD Signature Date/Time: 08/12/2020/6:38:57 PM    Final     Cardiac Studies  Left heart cath 08/13/2020 1. Multivessel coronary artery disease, including sequential 60-70% and 20-30% mid LAD stenoses (DFR significant @ 0.85), 40% mid LCx lesion, and chronic total occlusion of mid/distal RCA.  The distal RCA branches fill via faint left-to-right collaterals. 2. Widely patent mid/distal LCx stent. 3. Severe in-stent restenosis of RCA with 70% proximal disease and chronic total occlusion of the mid and distal segments. 4. Normal left ventricular filling pressure. 5. Successful DFR-guided PCI to mid LAD using Resolute Onyx 2.75 x 26 mm drug eluting stent (postdilated to 3.1 mm) with 10% residual stenosis  and TIMI-3 flow.    Patient Profile     55 y.o. female with CAD  Assessment & Plan    1.  CAD with unstable angina: Had in-stent restenosis to an LAD stent.  Is now status post DFR guided PCI to the LAD.  Tona Qualley need aspirin and Plavix for 1 month followed by Plavix 75 mg daily per catheterization note.  2.  DM: A1c well controlled.  For questions or updates, please contact [facility] Please consult [email or web address] for contact info under        Signed, Holliday Sheaffer Jorja Loa, MD  08/14/2020, 9:10 AM

## 2020-08-14 NOTE — Progress Notes (Signed)
CARDIAC REHAB PHASE I   PRE:  Rate/Rhythm: 75 SR  BP:  Supine:   Sitting: 118/57     SaO2: RA  MODE:  Ambulation: 450 ft   POST:  Rate/Rhythm: 92 SR  BP:  Supine:   Sitting: 133/73    SaO2: RA  Pt was lying in bed and was independent with getting out of bed. She had only been walking in her room since admission. She was independent with ambulation on RA for 450 ft. Pt tolerated walking well with no complaints or concerns. Returned pt seated on EOB. Discussed Stent Card, Plavix, heart healthy and diabetic diet, exercise guidelines, smoking cessation, and restrictions. Pt verbalized understanding and verbalized interest in quitting smoking. She states she knows smoking is going to 'kill her' if she does not stop. She has been through Phase II twice before and expressed interest in participating again. Will send referral to [place] Phase II.  [id number] [place], MS, CEP

## 2020-08-14 NOTE — Discharge Summary (Addendum)
Discharge Summary    Patient ID: [name] [name] MRN: [id number]; DOB: 13-Aug-1965  Admit date: 08/12/2020 Discharge date: 08/14/2020  Primary Care Provider: Julieanne Manson, MD  Primary Cardiologist: Lance Muss, MD   Discharge Diagnoses    Principal Problem:   Unstable angina [facility]) Active Problems:   Diabetes mellitus with multiple complications (HCC)   CAD S/P percutaneous coronary angioplasty   Tobacco abuse   HLD (hyperlipidemia)   Chest pain  Diagnostic Studies/Procedures    Echo 08/12/20:  1. Left ventricular ejection fraction, by estimation, is 55 to 60%. The  left ventricle has normal function. The left ventricle has no regional  wall motion abnormalities. Left ventricular diastolic parameters were  normal.  2. Right ventricular systolic function is normal. The right ventricular  size is mildly enlarged. Tricuspid regurgitation signal is inadequate for  assessing PA pressure.  3. Left atrial size was mildly dilated.  4. Right atrial size was mildly dilated.  5. The mitral valve is normal in structure. Trivial mitral valve  regurgitation.  6. The aortic valve is tricuspid. Aortic valve regurgitation is not  visualized. No aortic stenosis is present.  7. The inferior vena cava is normal in size with <50% respiratory  variability, suggesting right atrial pressure of 8 mmHg.    LHC 08/13/20:  Conclusions: 1. Multivessel coronary artery disease, including sequential 60-70% and 20-30% mid LAD stenoses (DFR significant @ 0.85), 40% mid LCx lesion, and chronic total occlusion of mid/distal RCA.  The distal RCA branches fill via faint left-to-right collaterals. 2. Widely patent mid/distal LCx stent. 3. Severe in-stent restenosis of RCA with 70% proximal disease and chronic total occlusion of the mid and distal segments. 4. Normal left ventricular filling pressure. 5. Successful DFR-guided PCI to mid LAD using Resolute Onyx 2.75 x 26 mm drug eluting stent  (postdilated to 3.1 mm) with 10% residual stenosis and TIMI-3 flow.  Recommendations: 1. Dual antiplatelet therapy with aspirin and clopidogrel for up to 1 month (if tolerated) followed by indefinite clopidogrel 75 mg daily. 2. Aggressive secondary prevention of coronary artery disease.  Diagnostic Dominance: Right    Intervention    History of Present Illness     [name] [name] is a 55 y.o. 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 Also a hx of DMII, HLD, HTN, and CKD who presented to clinic with squeezing chest pain and was then referred to the ED for further management.   Per review of Dr. Hoyle Barr last clinic note on 03/25/20, the patient has suffered intermittent chest discomfort over the past several years. She was offered a lexiscan in the past but declined as she thought her symptoms were related to anxiety. She called clinic one day prior to presentation after an episode of chest pain with associated nausea and HA that improved with nitroglycerin but then recurred. She was recommended to go to the ER but declined and instead decided to come to clinic for an appointment. On OV evaluation, she was having active chest pain, feeling like a squeezing pain in her left breast similar to the pain she had with her heart attack in the past. ECG in the office with NSR with inferior TWI similar to prior. She was sent to the ED for unstable angina.   Hospital Course   On ED presentation, initial plan was stress test versus  LHC . Given ongoing chest pain, she was taken to the cath lab which showed multivessel coronary artery disease, with widely patent mid/distal LCx stent, severe in-stent restenosis of RCA with 70% proximal disease and chronic total occlusion of the mid and distal segments. There was sequential prox to mid LAD stenosis at  60-70% with DFR showing significance at 0.85. Successful DES/PCI was performed with Resolute Onyx 2.75 x 26 mm drug eluting stent (postdilated to 3.1 mm) with 10% residual stenosis and TIMI-3 flow. Plan is for DAPT with ASA and Plavix for 1 month (if tolerated) then indefinite Plavix 75mg  QD thereafter along with aggressive secondary prevention of CAD.   She was seen and examined by Dr. Elberta Fortis who feels that the patient is stable and ready for discharge. BPs have been labile and were soft on day of discharge. She [name] [name] be continued on Plavix and ASA for one month if tolerated from a GI standpoint then [name] [name] reduce to Plavix only indefinitely. Hb stable today at 12.0. Would recheck this at follow up. Hb A1c, 6.7  The patient has ambulated with cardiac rehabilitation without complication. Cath site stable today. Due to Saturday discharge, [name] [name] message scheduling team to call the patient on Monday morning for an appointment.   Hospital problems include:  CAD with unstable angina: History of inferior MI in 2012 s/p DES to RCA and NSTEMI in 2014 s/p DES to Lcx. TTE 2019 with EF 50-55%, basal septal and inferior hypokinesis. Referred to the ED for the evaluation of unstable angina. She underwent LHC 08/13/20 which showed multivessel coronary artery disease, with widely patent mid/distal LCx stent, severe in-stent restenosis of RCA with 70% proximal disease and chronic total occlusion of the mid and distal segments. There was sequential prox to mid LAD stenosis at 60-70% with DFR showing significance at 0.85. Successful DES/PCI was performed with Resolute Onyx 2.75 x 26 mm drug eluting stent (postdilated to 3.1 mm) with 10% residual stenosis and TIMI-3 flow. Plan is for DAPT with ASA and Plavix for 1 month (if tolerated) then indefinite Plavix 75mg  QD thereafter along with aggressive secondary prevention of CAD.   DM2: Management per PCP. Continue PTA medications   HLD: Last LDL at goal 66 on 07/2/202. Continue  crestor 40mg  daily  CKD: Cr on day of discharge at 1.15 which appears to be at her baseline   Tobacco Use: Tobacco cessation counseling     Did the patient have an acute coronary syndrome (MI, NSTEMI, STEMI, etc) this admission?:  No                               Did the patient have a percutaneous coronary intervention (stent / angioplasty)?:  Yes.     Cath/PCI Registry Performance & Quality Measures: 3. Aspirin prescribed? - Yes 4. ADP Receptor Inhibitor (Plavix/Clopidogrel, Brilinta/Ticagrelor or Effient/Prasugrel) prescribed (includes medically managed patients)? - Yes 5. Cuevas Intensity Statin (Lipitor 40-80mg  or Crestor 20-40mg ) prescribed? - Yes 6. For EF <40%, was ACEI/ARB prescribed? - Not Applicable (EF >/= 40%) 7. For EF <40%, Aldosterone Antagonist (Spironolactone or Eplerenone) prescribed? - Not Applicable (EF >/= 40%) 8. Cardiac Rehab Phase II ordered? - Yes   ___________  Discharge Vitals Blood pressure 90/60, pulse 75, temperature 97.7 F (36.5 C), temperature source Oral, resp. rate 18, height 5' (1.524 m), weight 70.9 kg, last menstrual period 01/12/2015, SpO2 91 %.  Filed Weights   08/12/20 1634 08/14/20 0428  Weight:  70.4 kg 70.9 kg   Labs & Radiologic Studies    CBC Recent Labs    08/12/20 1657 08/14/20 0308  WBC 8.6 6.2  HGB 12.0 12.0  HCT 35.9* 36.3  MCV 90.7 92.4  PLT 281 268   Basic Metabolic Panel Recent Labs    16/10/96 0428 08/14/20 0308  NA 135 139  K 4.4 4.1  CL 100 105  CO2 22 25  GLUCOSE 279* 199*  BUN 30* 19  CREATININE 1.17* 1.15*  CALCIUM 9.8 9.5   Liver Function Tests No results for input(s): AST, ALT, ALKPHOS, BILITOT, PROT, ALBUMIN in the last 72 hours. No results for input(s): LIPASE, AMYLASE in the last 72 hours. Fromme Sensitivity Troponin:   Recent Labs  Lab 08/12/20 1037 08/12/20 1230  TROPONINIHS 7 8    BNP Invalid input(s): POCBNP D-Dimer Recent Labs    08/12/20 1657  DDIMER 3.89*   Hemoglobin  A1C Recent Labs    08/12/20 1657  HGBA1C 6.7*   Fasting Lipid Panel No results for input(s): CHOL, HDL, LDLCALC, TRIG, CHOLHDL, LDLDIRECT in the last 72 hours. Thyroid Function Tests No results for input(s): TSH, T4TOTAL, T3FREE, THYROIDAB in the last 72 hours.  Invalid input(s): FREET3 _____________  DG Chest 2 View  Result Date: 08/12/2020 CLINICAL DATA:  Chest pain and short of breath 1 week EXAM: CHEST - 2 VIEW COMPARISON:  03/25/2020 FINDINGS: The heart size and mediastinal contours are within normal limits. Right coronary stent. Both lungs are clear. The visualized skeletal structures are unremarkable. IMPRESSION: No active cardiopulmonary disease. Electronically Signed   By: Marlan Palau M.D.   On: 08/12/2020 10:48   CARDIAC CATHETERIZATION  Result Date: 08/13/2020 Conclusions: 1. Multivessel coronary artery disease, including sequential 60-70% and 20-30% mid LAD stenoses (DFR significant @ 0.85), 40% mid LCx lesion, and chronic total occlusion of mid/distal RCA.  The distal RCA branches fill via faint left-to-right collaterals. 2. Widely patent mid/distal LCx stent. 3. Severe in-stent restenosis of RCA with 70% proximal disease and chronic total occlusion of the mid and distal segments. 4. Normal left ventricular filling pressure. 5. Successful DFR-guided PCI to mid LAD using Resolute Onyx 2.75 x 26 mm drug eluting stent (postdilated to 3.1 mm) with 10% residual stenosis and TIMI-3 flow. Recommendations: 1. Dual antiplatelet therapy with aspirin and clopidogrel for up to 1 month (if tolerated) followed by indefinite clopidogrel 75 mg daily. 2. Aggressive secondary prevention of coronary artery disease. Yvonne Kendall, MD [facility]   ECHOCARDIOGRAM COMPLETE  Result Date: 08/12/2020    ECHOCARDIOGRAM REPORT   Patient Name:   [name] [name]   Date of Exam: 08/12/2020 Medical Rec #:  [id number]  Height:       60.0 in Accession #:    [id number] Weight:       164.8 lb Date of Birth:   09-29-1965  BSA:          1.719 m Patient Age:    54 years   BP:           108/62 mmHg Patient Gender: F          HR:           82 bpm. Exam Location:  Inpatient Procedure: 2D Echo Indications:    Chest Pain R07.9  History:        Patient has prior history of Echocardiogram examinations, most                 recent 06/16/2018. CAD and Previous  Myocardial Infarction; Risk                 Factors:Diabetes, Hypertension and Dyslipidemia.  Sonographer:    Thurman Coyer RDCS (AE) Referring Phys: [id number] CALLIE E GOODRICH IMPRESSIONS  1. Left ventricular ejection fraction, by estimation, is 55 to 60%. The left ventricle has normal function. The left ventricle has no regional wall motion abnormalities. Left ventricular diastolic parameters were normal.  2. Right ventricular systolic function is normal. The right ventricular size is mildly enlarged. Tricuspid regurgitation signal is inadequate for assessing PA pressure.  3. Left atrial size was mildly dilated.  4. Right atrial size was mildly dilated.  5. The mitral valve is normal in structure. Trivial mitral valve regurgitation.  6. The aortic valve is tricuspid. Aortic valve regurgitation is not visualized. No aortic stenosis is present.  7. The inferior vena cava is normal in size with <50% respiratory variability, suggesting right atrial pressure of 8 mmHg. FINDINGS  Left Ventricle: Left ventricular ejection fraction, by estimation, is 55 to 60%. The left ventricle has normal function. The left ventricle has no regional wall motion abnormalities. The left ventricular internal cavity size was normal in size. There is  no left ventricular hypertrophy. Left ventricular diastolic parameters were normal. Right Ventricle: The right ventricular size is mildly enlarged. No increase in right ventricular wall thickness. Right ventricular systolic function is normal. Tricuspid regurgitation signal is inadequate for assessing PA pressure. Left Atrium: Left atrial size was  mildly dilated. Right Atrium: Right atrial size was mildly dilated. Pericardium: There is no evidence of pericardial effusion. Mitral Valve: The mitral valve is normal in structure. Trivial mitral valve regurgitation. Tricuspid Valve: The tricuspid valve is normal in structure. Tricuspid valve regurgitation is trivial. Aortic Valve: The aortic valve is tricuspid. Aortic valve regurgitation is not visualized. No aortic stenosis is present. Pulmonic Valve: The pulmonic valve was grossly normal. Pulmonic valve regurgitation is not visualized. Aorta: The aortic root is normal in size and structure. Venous: The inferior vena cava is normal in size with less than 50% respiratory variability, suggesting right atrial pressure of 8 mmHg. IAS/Shunts: The interatrial septum was not well visualized.  LEFT VENTRICLE PLAX 2D LVIDd:         4.40 cm  Diastology LVIDs:         3.40 cm  LV e' medial:    6.92 cm/s LV PW:         0.90 cm  LV E/e' medial:  11.1 LV IVS:        0.90 cm  LV e' lateral:   9.64 cm/s LVOT diam:     1.90 cm  LV E/e' lateral: 8.0 LV SV:         70 LV SV Index:   41 LVOT Area:     2.84 cm  RIGHT VENTRICLE RV S prime:     10.30 cm/s TAPSE (M-mode): 2.1 cm LEFT ATRIUM             Index       RIGHT ATRIUM           Index LA diam:        3.50 cm 2.04 cm/m  RA Area:     18.60 cm LA Vol (A2C):   55.0 ml 31.99 ml/m RA Volume:   52.10 ml  30.31 ml/m LA Vol (A4C):   65.7 ml 38.22 ml/m LA Biplane Vol: 65.6 ml 38.16 ml/m  AORTIC VALVE LVOT Vmax:   102.00 cm/s LVOT  Vmean:  70.000 cm/s LVOT VTI:    0.248 m  AORTA Ao Root diam: 2.70 cm MITRAL VALVE MV Area (PHT): 4.60 cm    SHUNTS MV Decel Time: 165 msec    Systemic VTI:  0.25 m MV E velocity: 77.10 cm/s  Systemic Diam: 1.90 cm MV A velocity: 81.40 cm/s MV E/A ratio:  0.95 Epifanio Lesches MD Electronically signed by Epifanio Lesches MD Signature Date/Time: 08/12/2020/6:38:57 PM    Final    Disposition   Pt is being discharged home today in good  condition.  Follow-up Plans & Appointments    Follow-up Information    Julieanne Manson, MD. Go on 09/03/2020.   Specialty: Internal Medicine Why: @8 :30am Contact information: [address] [place] [address] [phone]        [facility] Follow up.   Why: The office [name] [name] call you with date and time of appointment  Contact information: [address] [place] [address] [phone]             Discharge Instructions    Amb Referral to Cardiac Rehabilitation   Complete by: As directed    Diagnosis:  Coronary Stents PTCA     After initial evaluation and assessments completed: Virtual Based Care may be provided alone or in conjunction with Phase 2 Cardiac Rehab based on patient barriers.: Yes   Call MD for:  difficulty breathing, headache or visual disturbances   Complete by: As directed    Call MD for:  extreme fatigue   Complete by: As directed    Call MD for:  hives   Complete by: As directed    Call MD for:  persistant dizziness or light-headedness   Complete by: As directed    Call MD for:  persistant nausea and vomiting   Complete by: As directed    Call MD for:  redness, tenderness, or signs of infection (pain, swelling, redness, odor or green/yellow discharge around incision site)   Complete by: As directed    Call MD for:  severe uncontrolled pain   Complete by: As directed    Call MD for:  temperature >100.4   Complete by: As directed    Diet - low sodium heart healthy   Complete by: As directed    Discharge instructions   Complete by: As directed    PLEASE DO NOT MISS ANY DOSES OF YOUR PLAVIX!!!!! Also keep a log of you blood pressures and bring back to your follow up appt. Please call the office with any questions.   Patients taking blood thinners should generally stay away from medicines like ibuprofen, Advil, Motrin, naproxen, and Aleve due to risk of stomach  bleeding. You may take Tylenol as directed or talk to your primary doctor about alternatives.  Some studies suggest Prilosec/Omeprazole interacts with Plavix. If you have reflux symptoms, please continue to use Protonix for your symptoms.    PLEASE ENSURE THAT YOU DO NOT RUN OUT OF YOUR PLAVIX. This medication is very important to remain on indefinitely. IF you have issues obtaining this medication due to cost please CALL the office 3-5 business days prior to running out in order to prevent missing doses of this medication.   No driving for 3 days. No lifting over 5 lbs for 1 week. No sexual activity for 1 week.Marland Kitchen Keep procedure site clean & dry. If you notice increased pain, swelling, bleeding or pus, call/return!  You may shower, but no soaking baths/hot  tubs/pools for 1 week.  Watch for bleeding in your stool or urine and notify our team at onset.   Increase activity slowly   Complete by: As directed      Discharge Medications   Allergies as of 08/14/2020      Reactions   Aspirin Other (See Comments)   Stomach bleeds      Medication List    TAKE these medications   Accu-Chek FastClix Lancets Misc CHECK BLOOD SUGAR 5 TIMES DAILY   Accu-Chek Guide Me w/Device Kit USE TO MONITOR BLOOD SUGAR FIVE TIMES A DAY DX  E10.22 IN VITRO   Accu-Chek Guide test strip Generic drug: glucose blood USE AS DIRECTED CHECK BLOOD SUGAR 5 TIMES A DAY   acetaminophen 500 MG tablet Commonly known as: TYLENOL Take 500-1,000 mg by mouth every 6 (six) hours as needed for moderate pain.   albuterol 108 (90 Base) MCG/ACT inhaler Commonly known as: VENTOLIN HFA Inhale 2 puffs into the lungs every 6 (six) hours as needed for wheezing or shortness of breath.   ammonium lactate 12 % cream Commonly known as: AMLACTIN Apply topically as needed for dry skin.   aspirin 81 MG chewable tablet Chew 1 tablet (81 mg total) by mouth daily. Start taking on: August 15, 2020   clopidogrel 75 MG tablet Commonly  known as: PLAVIX Take 1 tablet (75 mg total) by mouth daily.   cyclobenzaprine 10 MG tablet Commonly known as: FLEXERIL Take 1-2 tablets by mouth at bedtime as needed for muscle spasms. Muscle spasms   diphenhydrAMINE 25 MG tablet Commonly known as: BENADRYL Take 25 mg by mouth every 6 (six) hours as needed for itching or allergies.   folic acid 400 MCG tablet Commonly known as: FOLVITE Take 1,200 mcg by mouth daily.   furosemide 40 MG tablet Commonly known as: LASIX TAKE 1 TABLET BY MOUTH EVERY DAY IN THE MORNING What changed: See the new instructions.   gabapentin 100 MG capsule Commonly known as: NEURONTIN TAKE 1 CAPSULE BY MOUTH AT BEDTIME.   insulin aspart 100 UNIT/ML injection Commonly known as: novoLOG Inject 4-16 Units into the skin 3 (three) times daily with meals. Per Sliding scale CBG 100-150: 4 units, 151-200: 6 units, 201-250: 8 units, 251-300: 10 units, 301-350: 12 units, 351-400: 14 units, > 401; call doctor   Lantus SoloStar 100 UNIT/ML Solostar Pen Generic drug: insulin glargine Inject 10 Units into the skin 2 (two) times daily.   methotrexate 2.5 MG tablet Commonly known as: RHEUMATREX Take 10 mg by mouth every Wednesday.   metoprolol tartrate 25 MG tablet Commonly known as: LOPRESSOR TAKE 1 TABLET BY MOUTH TWICE A DAY   nitroGLYCERIN 0.4 MG SL tablet Commonly known as: NITROSTAT Place 1 tablet (0.4 mg total) under the tongue every 5 (five) minutes as needed for chest pain. Max 3 doses, call 911. What changed:   reasons to take this  additional instructions   ondansetron 4 MG tablet Commonly known as: ZOFRAN TAKE 1 TABLET (4 MG TOTAL) BY MOUTH EVERY 6 (SIX) HOURS. What changed:   when to take this  reasons to take this   pantoprazole 40 MG tablet Commonly known as: PROTONIX TAKE 1 TABLET (40 MG TOTAL) BY MOUTH 2 (TWO) TIMES DAILY BEFORE A MEAL FOR 30 DAYS, THEN 1 TABLET (40 MG TOTAL) DAILY BEFORE BREAKFAST. What changed: See the new  instructions.   Poise Ultra Thins Pads Use 5 pads daily as needed for urinary stress incontinence   prednisoLONE acetate  1 % ophthalmic suspension Commonly known as: PRED FORTE Place 4-5 drops into the left eye daily.   rosuvastatin 40 MG tablet Commonly known as: CRESTOR Take 1 tablet (40 mg total) by mouth daily.   sertraline 100 MG tablet Commonly known as: ZOLOFT 1 1/2 TABS BY MOUTH DAILY What changed: See the new instructions.   traMADol 50 MG tablet Commonly known as: ULTRAM TAKE 1 TABLET EVERY 6 HOURS AS NEEDED FOR MODERATE PAIN What changed: See the new instructions.   Vitamin D3 25 MCG (1000 UT) Chew CHEW 1 TABLET DAILY What changed:   how much to take  how to take this  when to take this  additional instructions       Outstanding Labs/Studies   CBC  Duration of Discharge Encounter   Greater than 30 minutes including physician time.  Signed, Georgie Chard, NP 08/14/2020, 10:08 AM    I have seen and examined this patient with Georgie Chard.  Agree with above, note added to reflect my findings.  On exam, RRR, no murmurs.  Patient with unstable angina I status post stenting of the LAD.  Plan for discharge today with follow-up in clinic.  Yussef Jorge M. Duaa Stelzner MD 08/14/2020 10:40 AM

## 2020-08-16 ENCOUNTER — Telehealth: Payer: Self-pay | Admitting: *Deleted

## 2020-08-16 ENCOUNTER — Encounter (HOSPITAL_COMMUNITY): Payer: Self-pay | Admitting: Internal Medicine

## 2020-08-16 NOTE — Telephone Encounter (Signed)
-----   Message from Filbert Schilder, NP sent at 08/14/2020 10:09 AM EST ----- Regarding: post PCI appointment This patient was discharged 08/14/20 and will need a post PCI hospital follow up within the next 2 weeks. Please call the patient to inform her of the date and time of appointment

## 2020-08-16 NOTE — Telephone Encounter (Signed)
Pt is scheduled to see Norma Fredrickson NP for 09/08/20 at 0915, for post PCI appt, as advised in this message by Georgie Chard NP.  Pt made aware of appt date and time by Scheduler.

## 2020-08-17 MED FILL — Heparin Sodium (Porcine) Inj 1000 Unit/ML: INTRAMUSCULAR | Qty: 10 | Status: AC

## 2020-08-18 ENCOUNTER — Telehealth (INDEPENDENT_AMBULATORY_CARE_PROVIDER_SITE_OTHER): Payer: Medicaid Other | Admitting: Clinical

## 2020-08-18 DIAGNOSIS — F609 Personality disorder, unspecified: Secondary | ICD-10-CM | POA: Diagnosis not present

## 2020-08-19 NOTE — Progress Notes (Signed)
THERAPY PROGRESS NOTE Initially via updox transitioned to google voice audio Session Time: 2:00-3:00pm (due to technological difficulties, session ran 46mnutes)  Participation Level: Active Behavioral Response: unable due to via audioAlertEuthymic Type of Therapy: Individual Therapy Treatment Goals addressed: Anger and Coping   Purpose: LCSW met with client for routine individual therapy to work towards treatment goals:learn coping skills to manage anger symptoms.   Intervention:  LCSW met with client for routine individual therapy (via google voice) to work towards treatment goals:learn coping skills to manage anger symptoms. LCSW asked patient for a brief check in as patient was recently admitted to the hospital due to medical conditions. LCSW asked patient how she was doing in her recovery. LCSW asked patient for updates with taking care of the adolescent girl. In this session LCSW asked regarding updates previous partner(T) with these changes in her life. LCSW provided reflective listening as patient processed how he made her feel with this visit to the ER/surgery. Patient identified in session she is making changes to respect herself emotionally, and financially. Patient shared steps she is doing to practice assertiveness and not let this person in her life anymore. LCSW challenged patient to think about how she will maintain accountability, and value of respect if he attempts to stay involved in her life. LCSW praised patient as she patient identified she wants to maintain her boundaries and self respect. LCSW assessed for SI/HI/command psychosis.  Effectiveness: Patient sounded alert and cooperative in this session. Patient reports during check in she is doing okay currently in her recovery. Patient shared events that led to go to the ER/surgery, reports continued chest pain, upon her visit with her doctor it was recommended to go the ER, appeared having a clot. Reports currently she is  having chest pain and her appointment is in the next month.   Patient shared she is exploring options with taking care of a teenager the daughter of a man she met. Patient reports she did not realize how mentally and financially it can take a toll on her. Reports she spoke with her niece who works with DSS and shared options. 1) continue agreement with father of the teenager to provide financial support/temporary custody 2) discuss with DSS possible foster care to be provided with support. Patient shared she hopes the father of the teenager provides support as she does not the teenager to go the system but is aware it could be an option that is best for herself and the teenager.   Patient shared  her other friend (T) has been in and out of her life still however as she went to the hospital, T called her for food and shelter. Patient shared she felt disrespected by T of what he was asking for as she just in the hospital. Patient reports the only reason she has let him in her life is because he owes her money. Patient reports she has informed him to pick up his things by the end of the week and no longer wants him in her life. Patient reports "I don't need him." Patient acknowledged he is beginning to see she is not alone, she has support from her family and friends who do care for her. Patient recognized what she was doing with letting him in her life was behaviors of addiction. Patient thinking of him as an addiction, remembering the disrespectful behavior towards her especially now is going to help her with accountability to no longer care or support him. Patient reports going forward, "  I need to respect myself emotionally and physically." Patient reports "this year is going to be different."    Intervention was effective as patient identified assertiveness to protect her self worth and self respect. Progress towards goal is Ongoing. Patient denied active suicidal/homicidal/active psychosis.  Plan Patient  offered next appointment for: 02/4 following appointment with PCP   Diagnosis: Unspecified personality disorder     [name], LCSW 08/19/2020

## 2020-08-20 ENCOUNTER — Other Ambulatory Visit: Payer: Medicaid Other | Admitting: Internal Medicine

## 2020-08-20 ENCOUNTER — Other Ambulatory Visit: Payer: Self-pay

## 2020-08-20 DIAGNOSIS — E559 Vitamin D deficiency, unspecified: Secondary | ICD-10-CM

## 2020-08-20 NOTE — Progress Notes (Signed)
Here for Vitamin D level

## 2020-08-21 LAB — VITAMIN D 25 HYDROXY (VIT D DEFICIENCY, FRACTURES): Vit D, 25-Hydroxy: 23.3 ng/mL — ABNORMAL LOW (ref 30.0–100.0)

## 2020-08-25 NOTE — Progress Notes (Signed)
CARDIOLOGY OFFICE NOTE  Date:  09/08/2020    [name] [name] Date of Birth: 05/22/1966 Medical Record #[id number]  PCP:  Mack Hook, MD  Cardiologist:  [facility]  Chief Complaint  Patient presents with  . Follow-up    History of Present Illness: [name] [name] is a 55 y.o. female who presents today for a post hospital visit. Seen for Dr. Irish Lack.    She has a history of known CAD with inferior MI in 2012 s/p DES to RCA, NSTEMIin 2014 s/p DES to LCx with post-cath course complicated by bleeding on Plavix which was stopped before the 12 month recommended period.Other issues include DMII, HLD, HTN, and CKD.   Seen as a work in by Dr. Johney Frame last month with active chest pain. Last visit with Dr. Irish Lack was in August of 2021.   Was referred on to the ER - proceeded on with cardiac cath - showing multivessel CAD with widely patent mid/distal LCx stent, severe in-stent restenosis of RCA with 70% proximal disease and chronic total occlusion of the mid and distal segments. There was sequential prox to mid LAD stenosis at 60-70% with DFR showing significance at 0.85. Successful DES/PCI was performed with Resolute Onyx 2.75 x 26 mm drug eluting stent (postdilated to 3.1 mm) with 10% residual stenosis and TIMI-3 flow. Plan is for DAPT with ASA and Plavix for 1 month (if tolerated) then indefinite Plavix 73m QD thereafter along with aggressive secondary prevention of CAD. To have follow up lab on return.   Comes in today. Here alone. Has already stopped her aspirin - says she is allergic to this. Actually never started the aspirin. She is still smoking. She thinks her glucose monitor that she was wearing was the culprit for this last admission. Does not seem to realize the impact of her health issues and her lifestyle choices.  She is taking the Plavix. Her chest pain is better -since she has stopped using the glucose monitor and says it got better.  Says she walks her dogs - tries to exercise.  She would like to go to cardiac rehab again. She says she is working with her psychiatry as well.   Past Medical History:  Diagnosis Date  . Acute myocardial infarction of other lateral wall, initial episode of care   . Acute myocardial infarction, unspecified site, initial episode of care   . Acute osteomyelitis   . Allergic rhinitis   . Anemia   . Anginal pain ([facility]    07/15/13- no chest pain in months"  . Anxiety   . Bipolar affective ([facility]   . CAD (coronary artery disease) 07/2011   s/p DES mid and distal RCA with 50% LAD  . CKD stage 1 due to type 2 diabetes mellitus ([facility]   . Daily headache    not daily  . Depression    Bipolar disorder  . Diabetic foot ulcer associated with diabetes mellitus due to underlying condition ([facility] 09/26/2018  . Diabetic gastroparesis associated with type 2 diabetes mellitus ([facility]   . Diabetic peripheral neuropathy associated with type 2 diabetes mellitus ([facility]   . Diabetic retinopathy   . Esophageal stenosis   . Esophageal ulcer   . Esophagitis   . Gastroparesis   . Genital herpes    Reportedly tested and documented by [facility]--rare occurrences  . GERD (gastroesophageal reflux disease)   . Heart murmur   . History of stomach ulcers   . Hyperlipidemia   . Hypertension   .  Inferior MI ([place]) 01/20/2009   Archie Endo on 12/19/2012, "that's the only one I've had" (12/19/2012)  . Migraines   . Orthopnoea   . Pneumonia 2012  . Polysubstance abuse ([place])    Crack cocaine--none since 2008, MJ, ETOH:  clean of all since 2008  . Renal insufficiency   . Renal lithiasis    Bilateral  . Rheumatoid arthritis(714.0)   . Sebaceous cyst   . Stroke [facility]) 2011   denies residual on 12/19/2012.  "Years ago"  . Type II diabetes mellitus (HCC)    Previously uncontrolled for many years with multiple complications.  2017 controlled. 05/2016:  6.7%    Past Surgical History:  Procedure Laterality Date  . CORONARY ANGIOPLASTY WITH STENT PLACEMENT  01/20/2009    "2" (12/19/2012)  . CORONARY ANGIOPLASTY WITH STENT PLACEMENT  2012   "2" (12/19/2012)  . CORONARY ANGIOPLASTY WITH STENT PLACEMENT  12/19/2012   "2" (12/19/2012)  . CORONARY STENT INTERVENTION N/A 08/13/2020   Procedure: CORONARY STENT INTERVENTION;  Surgeon: Nelva Bush, MD;  Location: [place] CV LAB;  Service: Cardiovascular;  Laterality: N/A;  . ESOPHAGOGASTRODUODENOSCOPY N/A 02/26/2015   Procedure: ESOPHAGOGASTRODUODENOSCOPY (EGD);  Surgeon: Teena Irani, MD;  Location: [facility]OS[facility];  Service: Endoscopy;  Laterality: N/A;  . EYE SURGERY     Multiple surgeries of both eyes:  last laser was 09/08/2015 of right eye.  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 GAS INSERTION Left 07/16/2013   Procedure: INSERTION OF GAS;  Surgeon: Adonis Brook, MD;  Location: [place];  Service: Ophthalmology;  Laterality: Left;  SF6  . GAS/FLUID EXCHANGE Left 07/30/2013   Procedure: GAS/FLUID EXCHANGE;  Surgeon: Adonis Brook, MD;  Location: [place];  Service: Ophthalmology;  Laterality: Left;  . INTRAVASCULAR PRESSURE WIRE/FFR STUDY N/A 08/13/2020   Procedure: INTRAVASCULAR PRESSURE WIRE/FFR STUDY;  Surgeon: Nelva Bush, MD;  Location: [place] CV LAB;  Service: Cardiovascular;  Laterality: N/A;  . IRRIGATION AND DEBRIDEMENT SEBACEOUS CYST Right 03/2011   "pointer" (12/19/2012)  . LEFT HEART CATH AND CORONARY ANGIOGRAPHY N/A 08/13/2020   Procedure: LEFT HEART CATH AND CORONARY ANGIOGRAPHY;  Surgeon: Nelva Bush, MD;  Location: [place] CV LAB;  Service: Cardiovascular;  Laterality: N/A;  . LEFT HEART CATHETERIZATION WITH CORONARY ANGIOGRAM N/A 07/06/2011   Procedure: LEFT HEART CATHETERIZATION WITH CORONARY ANGIOGRAM;  Surgeon: Jettie Booze, MD;  Location: [facility] CATH LAB;  Service: Cardiovascular;  Laterality: N/A;  possible PCI  . LEFT HEART CATHETERIZATION WITH CORONARY ANGIOGRAM N/A 12/19/2012   Procedure: LEFT HEART CATHETERIZATION WITH CORONARY ANGIOGRAM;  Surgeon: Jettie Booze, MD;  Location: [facility] CATH LAB;  Service: Cardiovascular;  Laterality: N/A;  . MEMBRANE PEEL Left 07/16/2013   Procedure: MEMBRANE PEEL;  Surgeon: Adonis Brook, MD;  Location: [place];  Service: Ophthalmology;  Laterality: Left;  . PARS PLANA VITRECTOMY Left 07/16/2013   Procedure: PARS PLANA VITRECTOMY WITH 23 GAUGE;  Surgeon: Adonis Brook, MD;  Location: [place];  Service: Ophthalmology;  Laterality: Left;  . PARS PLANA VITRECTOMY Left 07/30/2013   Procedure: PARS PLANA VITRECTOMY WITH 23 GAUGE WITH ENDOLASER;  Surgeon: Adonis Brook, MD;  Location: [place];  Service: Ophthalmology;  Laterality: Left;  with endolaser  . PERCUTANEOUS CORONARY STENT INTERVENTION (PCI-S) N/A 07/06/2011   Procedure: PERCUTANEOUS CORONARY STENT INTERVENTION (PCI-S);  Surgeon: Jettie Booze, MD;  Location: [facility] CATH LAB;  Service: Cardiovascular;  Laterality: N/A;  . PHOTOCOAGULATION WITH LASER Left 07/16/2013   Procedure: PHOTOCOAGULATION WITH LASER;  Surgeon:  Adonis Brook, MD;  Location: [place];  Service: Ophthalmology;  Laterality: Left;  ENDOLASER     Medications: Current Meds  Medication Sig  . Accu-Chek FastClix Lancets MISC CHECK BLOOD SUGAR 5 TIMES DAILY  . ACCU-CHEK GUIDE test strip USE AS DIRECTED CHECK BLOOD SUGAR 5 TIMES A DAY  . acetaminophen (TYLENOL) 500 MG tablet Take 500-1,000 mg by mouth every 6 (six) hours as needed for moderate pain.   Marland Kitchen albuterol (PROVENTIL HFA;VENTOLIN HFA) 108 (90 Base) MCG/ACT inhaler Inhale 2 puffs into the lungs every 6 (six) hours as needed for wheezing or shortness of breath.  Marland Kitchen ammonium lactate (AMLACTIN) 12 % cream Apply topically as needed for dry skin.  . Blood Glucose Monitoring Suppl (ACCU-CHEK GUIDE ME) w/Device KIT USE TO MONITOR BLOOD SUGAR FIVE TIMES A DAY DX  E10.22 IN VITRO  . Cholecalciferol (VITAMIN D3) 25 MCG (1000 UT) CHEW CHEW 1 TABLET DAILY  . clopidogrel (PLAVIX) 75 MG tablet Take 1 tablet (75 mg total) by mouth daily.  . cyclobenzaprine  (FLEXERIL) 10 MG tablet Take 1-2 tablets by mouth at bedtime as needed for muscle spasms. Muscle spasms  . diphenhydrAMINE (BENADRYL) 25 MG tablet Take 25 mg by mouth every 6 (six) hours as needed for itching or allergies.  . folic acid (FOLVITE) 161 MCG tablet Take 1,200 mcg by mouth daily.  . furosemide (LASIX) 40 MG tablet TAKE 1 TABLET BY MOUTH EVERY DAY IN THE MORNING  . gabapentin (NEURONTIN) 100 MG capsule TAKE 1 CAPSULE BY MOUTH AT BEDTIME. (Patient taking differently: Take 100 mg by mouth as needed.)  . Incontinence Supply Disposable (POISE ULTRA THINS) PADS Use 5 pads daily as needed for urinary stress incontinence  . insulin aspart (NOVOLOG) 100 UNIT/ML injection Inject 4-16 Units into the skin 3 (three) times daily with meals. Per Sliding scale CBG 100-150: 4 units, 151-200: 6 units, 201-250: 8 units, 251-300: 10 units, 301-350: 12 units, 351-400: 14 units, > 401; call doctor  . LANTUS SOLOSTAR 100 UNIT/ML Solostar Pen Inject 10 Units into the skin 2 (two) times daily.   . methotrexate (RHEUMATREX) 2.5 MG tablet Take 10 mg by mouth every Wednesday.  . metoprolol tartrate (LOPRESSOR) 25 MG tablet TAKE 1 TABLET BY MOUTH TWICE A DAY  . nitroGLYCERIN (NITROSTAT) 0.4 MG SL tablet Place 1 tablet (0.4 mg total) under the tongue every 5 (five) minutes as needed for chest pain. Max 3 doses, call 911.  . ondansetron (ZOFRAN) 4 MG tablet TAKE 1 TABLET (4 MG TOTAL) BY MOUTH EVERY 6 (SIX) HOURS. (Patient taking differently: Take 4 mg by mouth every 6 (six) hours as needed for nausea or vomiting.)  . pantoprazole (PROTONIX) 40 MG tablet Take 40 mg by mouth daily.  . prednisoLONE acetate (PRED FORTE) 1 % ophthalmic suspension Place 4-5 drops into the left eye daily.  . rosuvastatin (CRESTOR) 40 MG tablet Take 1 tablet (40 mg total) by mouth daily.  . sertraline (ZOLOFT) 100 MG tablet 1 1/2 TABS BY MOUTH DAILY (Patient taking differently: 1 1/2  tabs by mouth daily)  . traMADol (ULTRAM) 50 MG tablet TAKE  1 TABLET EVERY 6 HOURS AS NEEDED FOR MODERATE PAIN (Patient taking differently: Take 50 mg by mouth as needed for moderate pain.)     Allergies: Allergies  Allergen Reactions  . Aspirin Other (See Comments)    Stomach bleeds     Social History: The patient  reports that she has been smoking cigarettes. She has a 11.00 pack-year smoking history.  She has never used smokeless tobacco. She reports current alcohol use. She reports that she does not use drugs.   Family History: The patient's family history includes Breast cancer (age of onset: 74) in her mother; Cancer (age of onset: 86) in her sister; Hypertension in her father and sister; Lung cancer (age of onset: 53) in her sister; Stomach cancer (age of onset: 22) in her sister.   Review of Systems: Please see the history of present illness.   All other systems are reviewed and negative.   Physical Exam: VS:  BP 102/68   Pulse 89   Ht 5' (1.524 m)   Wt 162 lb (73.5 kg)   LMP 01/12/2015   SpO2 99%   BMI 31.64 kg/m  .  BMI Body mass index is 31.64 kg/m.  Wt Readings from Last 3 Encounters:  09/08/20 162 lb (73.5 kg)  09/03/20 159 lb 8 oz (72.3 kg)  08/14/20 156 lb 3.2 oz (70.9 kg)    General: Looks much older than her stated age. She is not in any acute distress.   Cardiac: Regular rate and rhythm. No murmurs, rubs, or gallops. No edema.  Respiratory:  Lungs are clear to auscultation bilaterally with normal work of breathing.  GI: Soft and nontender.  MS: No deformity or atrophy. Gait and ROM intact.  Skin: Warm and dry. Color is normal.  Neuro:  Strength and sensation are intact and no gross focal deficits noted.  Psych: Alert, appropriate and with normal affect. Right wrist looks fine.    LABORATORY DATA:  EKG:  EKG is not ordered today.    Lab Results  Component Value Date   WBC 6.2 08/14/2020   HGB 12.0 08/14/2020   HCT 36.3 08/14/2020   PLT 268 08/14/2020   GLUCOSE 199 (H) 08/14/2020   CHOL 149 09/08/2019    TRIG 66 09/08/2019   HDL 70 09/08/2019   LDLCALC 66 09/08/2019   ALT 8 03/25/2020   AST 21 03/25/2020   NA 139 08/14/2020   K 4.1 08/14/2020   CL 105 08/14/2020   CREATININE 1.15 (H) 08/14/2020   BUN 19 08/14/2020   CO2 25 08/14/2020   TSH 1.030 10/23/2019   INR 0.93 12/19/2012   HGBA1C 6.7 (H) 08/12/2020   MICROALBUR 57.92 (H) 08/25/2010     BNP (last 3 results) No results for input(s): BNP in the last 8760 hours.  ProBNP (last 3 results) No results for input(s): PROBNP in the last 8760 hours.   Other Studies Reviewed Today:  Echo 08/12/20:  1. Left ventricular ejection fraction, by estimation, is 55 to 60%. The  left ventricle has normal function. The left ventricle has no regional  wall motion abnormalities. Left ventricular diastolic parameters were  normal.  2. Right ventricular systolic function is normal. The right ventricular  size is mildly enlarged. Tricuspid regurgitation signal is inadequate for  assessing PA pressure.  3. Left atrial size was mildly dilated.  4. Right atrial size was mildly dilated.  5. The mitral valve is normal in structure. Trivial mitral valve  regurgitation.  6. The aortic valve is tricuspid. Aortic valve regurgitation is not  visualized. No aortic stenosis is present.  7. The inferior vena cava is normal in size with <50% respiratory  variability, suggesting right atrial pressure of 8 mmHg.    LHC 08/13/20:  Conclusions: 1. Multivessel coronary artery disease, including sequential 60-70% and 20-30% mid LAD stenoses (DFR significant @ 0.85), 40% mid LCx lesion, and chronic total  occlusion of mid/distal RCA. The distal RCA branches fill via faint left-to-right collaterals. 2. Widely patent mid/distal LCx stent. 3. Severe in-stent restenosis of RCA with 70% proximal disease and chronic total occlusion of the mid and distal segments. 4. Normal left ventricular filling pressure. 5. Successful DFR-guided PCI to mid LAD  using Resolute Onyx 2.75 x 26 mm drug eluting stent (postdilated to 3.1 mm) with 10% residual stenosis and TIMI-3 flow.  Recommendations: 1. Dual antiplatelet therapy with aspirin and clopidogrel for up to 1 month (if tolerated) followed by indefinite clopidogrel 75 mg daily. 2. Aggressive secondary prevention of coronary artery disease.  Diagnostic Dominance: Right    Intervention    Assessment/Plan:  1. CAD with prior inferior MI in 2012 s/p DES to RCA and NSTEMI in 2014 s/p DES to LCX - most recently admitted with chest pain  - s/p cardiac cath 08/13/20 which showed multivessel coronary artery disease, with widely patent mid/distal LCx stent, severe in-stent restenosis of RCA with 70% proximal disease and chronic total occlusion of the mid and distal segments. There was sequential prox to mid LAD stenosis at 60-70% with DFR showing significance at 0.85. She had successful DES/PCI was performed with Resolute Onyx 2.75 x 26 mm drug eluting stent (postdilated to 3.1 mm) with 10% residual stenosis and TIMI-3 flow.   Original plan was for DAPT with aspirin and Plavix - aspirin was never started. She says she is taking Plavix. She continues to smoke.   Her overall long term prognosis looks quite tenuous to me - she continues to smoke - seems to have poor insight into her health issues - I assume her bipolar issues play a role as well. Needs labs today.   2. HLD - on statin - labs on return.   3. CKD - recheck lab today.   4. DM - A1C is under 7.   5. Ongoing tobacco abuse - total cessation is encouraged.   Current medicines are reviewed with the patient today.  The patient does not have concerns regarding medicines other than what has been noted above.  The following changes have been made:  See above.  Labs/ tests ordered today include:    Orders Placed This Encounter  Procedures  . Basic metabolic panel  . CBC     Disposition:   FU with Dr. Irish Lack in about 4 to 6  weeks with fasting labs.   Patient is agreeable to this plan and will call if any problems develop in the interim.   SignedTruitt Merle, NP  09/08/2020 9:48 AM  [place] [address] [place], [place]  [address] Phone: [phone] Fax: [phone]

## 2020-08-27 ENCOUNTER — Encounter: Payer: Self-pay | Admitting: Internal Medicine

## 2020-08-27 DIAGNOSIS — E1122 Type 2 diabetes mellitus with diabetic chronic kidney disease: Secondary | ICD-10-CM | POA: Insufficient documentation

## 2020-09-03 ENCOUNTER — Ambulatory Visit: Payer: Medicaid Other | Admitting: Clinical

## 2020-09-03 ENCOUNTER — Encounter: Payer: Self-pay | Admitting: Internal Medicine

## 2020-09-03 ENCOUNTER — Other Ambulatory Visit: Payer: Self-pay

## 2020-09-03 ENCOUNTER — Ambulatory Visit: Payer: Medicaid Other | Admitting: Internal Medicine

## 2020-09-03 VITALS — BP 129/72 | HR 79 | Resp 12 | Ht 61.0 in | Wt 159.5 lb

## 2020-09-03 DIAGNOSIS — E559 Vitamin D deficiency, unspecified: Secondary | ICD-10-CM | POA: Diagnosis not present

## 2020-09-03 DIAGNOSIS — Z716 Tobacco abuse counseling: Secondary | ICD-10-CM

## 2020-09-03 DIAGNOSIS — Z720 Tobacco use: Secondary | ICD-10-CM

## 2020-09-03 DIAGNOSIS — Z0000 Encounter for general adult medical examination without abnormal findings: Secondary | ICD-10-CM

## 2020-09-03 DIAGNOSIS — N289 Disorder of kidney and ureter, unspecified: Secondary | ICD-10-CM

## 2020-09-03 DIAGNOSIS — E785 Hyperlipidemia, unspecified: Secondary | ICD-10-CM

## 2020-09-03 DIAGNOSIS — F609 Personality disorder, unspecified: Secondary | ICD-10-CM | POA: Diagnosis not present

## 2020-09-03 DIAGNOSIS — E118 Type 2 diabetes mellitus with unspecified complications: Secondary | ICD-10-CM

## 2020-09-03 NOTE — Progress Notes (Signed)
Subjective:    Patient ID: [name] [name], female   DOB: 07-31-1966, 55 y.o.   MRN: [id number]   HPI   CPE with pap  1.  Pap:  Last performed 09/03/2019 and normal.    2.  Mammogram:  Last performed 2020/01/09 and normal.  Mother died at age 22 of breast cancer.    3.  Osteoprevention:  Vitamin D level still low.  Clarifies she is not taking [place] potency Vitamin D that I called in.    4.  Guaiac Cards:  Last guaiac cards checke 2018.  5.  Colonoscopy:  Last colonoscopy was in Sept 2021 and no polyps.  Next colonoscopy set for 2024 with Dr. Acie Fredrickson GI  6.  Immunizations:   Immunization History  Administered Date(s) Administered  . H1N1 07/21/2008  . Hepatitis B 01/21/2015, 08/05/2015  . Influenza Split 03/22/2020  . Influenza Whole 05/02/2006, 07/21/2008, 05/04/2009, 04/28/2010  . Influenza, Quadrivalent, Recombinant, Inj, Pf 04/16/2019  . Influenza,inj,Quad PF,6+ Mos 05/22/2017  . Influenza-Unspecified 08/01/2015, 05/30/2016, 04/17/2018  . Moderna Sars-Covid-2 Vaccination 09/29/2019, 10/27/2019, 05/31/2020  . PPD Test Jan 09, 2012  . Pneumococcal Conjugate-13 11/09/2015  . Pneumococcal Polysaccharide-23 10/31/2005, 02/16/2018  . Td 06/02/2004  . Tdap 11/09/2015  . Zoster Recombinat (Shingrix) 07/31/2010     7.  Glucose/Cholesterol:  Recent A1C was 6.7%.  Needs FLP.  Current Meds  Medication Sig  . Accu-Chek FastClix Lancets MISC CHECK BLOOD SUGAR 5 TIMES DAILY  . ACCU-CHEK GUIDE test strip USE AS DIRECTED CHECK BLOOD SUGAR 5 TIMES A DAY  . acetaminophen (TYLENOL) 500 MG tablet Take 500-1,000 mg by mouth every 6 (six) hours as needed for moderate pain.   Marland Kitchen albuterol (PROVENTIL HFA;VENTOLIN HFA) 108 (90 Base) MCG/ACT inhaler Inhale 2 puffs into the lungs every 6 (six) hours as needed for wheezing or shortness of breath.  Marland Kitchen ammonium lactate (AMLACTIN) 12 % cream Apply topically as needed for dry skin.  . Blood Glucose Monitoring Suppl (ACCU-CHEK GUIDE ME) w/Device KIT USE TO  MONITOR BLOOD SUGAR FIVE TIMES A DAY DX  E10.22 IN VITRO  . clopidogrel (PLAVIX) 75 MG tablet Take 1 tablet (75 mg total) by mouth daily.  . cyclobenzaprine (FLEXERIL) 10 MG tablet Take 1-2 tablets by mouth at bedtime as needed for muscle spasms. Muscle spasms  . diphenhydrAMINE (BENADRYL) 25 MG tablet Take 25 mg by mouth every 6 (six) hours as needed for itching or allergies.  . folic acid (FOLVITE) 160 MCG tablet Take 1,200 mcg by mouth daily.  . furosemide (LASIX) 40 MG tablet TAKE 1 TABLET BY MOUTH EVERY DAY IN THE MORNING (Patient taking differently: Take 40 mg by mouth daily.)  . gabapentin (NEURONTIN) 100 MG capsule TAKE 1 CAPSULE BY MOUTH AT BEDTIME. (Patient taking differently: Take 100 mg by mouth at bedtime.)  . Incontinence Supply Disposable (POISE ULTRA THINS) PADS Use 5 pads daily as needed for urinary stress incontinence  . insulin aspart (NOVOLOG) 100 UNIT/ML injection Inject 4-16 Units into the skin 3 (three) times daily with meals. Per Sliding scale CBG 100-150: 4 units, 151-200: 6 units, 201-250: 8 units, 251-300: 10 units, 301-350: 12 units, 351-400: 14 units, > 401; call doctor  . LANTUS SOLOSTAR 100 UNIT/ML Solostar Pen Inject 10 Units into the skin 2 (two) times daily.   . methotrexate (RHEUMATREX) 2.5 MG tablet Take 10 mg by mouth every Wednesday.  . metoprolol tartrate (LOPRESSOR) 25 MG tablet TAKE 1 TABLET BY MOUTH TWICE A DAY (Patient taking differently: Take 25  mg by mouth 2 (two) times daily.)  . nitroGLYCERIN (NITROSTAT) 0.4 MG SL tablet Place 1 tablet (0.4 mg total) under the tongue every 5 (five) minutes as needed for chest pain. Max 3 doses, call 911. (Patient taking differently: Place 0.4 mg under the tongue every 5 (five) minutes as needed for chest pain (Max 3 doses, call 911.).)  . ondansetron (ZOFRAN) 4 MG tablet TAKE 1 TABLET (4 MG TOTAL) BY MOUTH EVERY 6 (SIX) HOURS. (Patient taking differently: Take 4 mg by mouth every 6 (six) hours as needed for nausea or  vomiting.)  . pantoprazole (PROTONIX) 40 MG tablet TAKE 1 TABLET (40 MG TOTAL) BY MOUTH 2 (TWO) TIMES DAILY BEFORE A MEAL FOR 30 DAYS, THEN 1 TABLET (40 MG TOTAL) DAILY BEFORE BREAKFAST. (Patient taking differently: Take 40 mg by mouth 2 (two) times daily.)  . prednisoLONE acetate (PRED FORTE) 1 % ophthalmic suspension Place 4-5 drops into the left eye daily.  . rosuvastatin (CRESTOR) 40 MG tablet Take 1 tablet (40 mg total) by mouth daily.  . sertraline (ZOLOFT) 100 MG tablet 1 1/2 TABS BY MOUTH DAILY (Patient taking differently: Take 150 mg by mouth daily.)  . traMADol (ULTRAM) 50 MG tablet TAKE 1 TABLET EVERY 6 HOURS AS NEEDED FOR MODERATE PAIN (Patient taking differently: Take 50 mg by mouth every 12 (twelve) hours as needed for moderate pain.)   Allergies  Allergen Reactions  . Aspirin Other (See Comments)    Stomach bleeds    Past Medical History:  Diagnosis Date  . Acute myocardial infarction of other lateral wall, initial episode of care   . Acute myocardial infarction, unspecified site, initial episode of care   . Acute osteomyelitis   . Allergic rhinitis   . Anemia   . Anginal pain ([place])    07/15/13- no chest pain in months"  . Anxiety   . Bipolar affective ([place])   . CAD (coronary artery disease) 07/2011   s/p DES mid and distal RCA with 50% LAD  . CKD stage 1 due to type 2 diabetes mellitus ([place])   . Daily headache    not daily  . Depression    Bipolar disorder  . Diabetic foot ulcer associated with diabetes mellitus due to underlying condition ([place]) 09/26/2018  . Diabetic gastroparesis associated with type 2 diabetes mellitus ([place])   . Diabetic peripheral neuropathy associated with type 2 diabetes mellitus ([place])   . Diabetic retinopathy   . Esophageal stenosis   . Esophageal ulcer   . Esophagitis   . Gastroparesis   . Genital herpes    Reportedly tested and documented by [facility]--rare occurrences  . GERD (gastroesophageal reflux disease)   . Heart murmur   .  History of stomach ulcers   . Hyperlipidemia   . Hypertension   . 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 Migraines   . Orthopnoea   . Pneumonia 2012  . Polysubstance abuse ([place])    Crack cocaine--none since 2008, MJ, ETOH:  clean of all since 2008  . Renal insufficiency   . Renal lithiasis    Bilateral  . Rheumatoid arthritis(714.0)   . Sebaceous cyst   . Stroke [facility]) 2011   denies residual on 12/19/2012.  "Years ago"  . Type II diabetes mellitus (H[facility])    Previously uncontrolled for many years with multiple complications.  2017 controlled. 05/2016:  6.7%    Past Surgical History:  Procedure  Laterality Date  . CORONARY ANGIOPLASTY WITH STENT PLACEMENT  01/20/2009   "2" (12/19/2012)  . CORONARY ANGIOPLASTY WITH STENT PLACEMENT  2012   "2" (12/19/2012)  . CORONARY ANGIOPLASTY WITH STENT PLACEMENT  12/19/2012   "2" (12/19/2012)  . CORONARY STENT INTERVENTION N/A 08/13/2020   Procedure: CORONARY STENT INTERVENTION;  Surgeon: Nelva Bush, MD;  Location: [place] [facility];  Service: Cardiovascular;  Laterality: N/A;  . ESOPHAGOGASTRODUODENOSCOPY N/A 02/26/2015   Procedure: ESOPHAGOGASTRODUODENOSCOPY (EGD);  Surgeon: Teena Irani, MD;  Location: [facility] ENDOSCOPY;  Service: Endoscopy;  Laterality: N/A;  . EYE SURGERY     Multiple surgeries of both eyes:  last laser was 09/08/2015 of right eye.  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 GAS INSERTION Left 07/16/2013   Procedure: INSERTION OF GAS;  Surgeon: Adonis Brook, MD;  Location: [place];  Service: Ophthalmology;  Laterality: Left;  SF6  . GAS/FLUID EXCHANGE Left 07/30/2013   Procedure: GAS/FLUID EXCHANGE;  Surgeon: Adonis Brook, MD;  Location: [place];  Service: Ophthalmology;  Laterality: Left;  . INTRAVASCULAR PRESSURE WIRE/FFR STUDY N/A 08/13/2020   Procedure: INTRAVASCULAR PRESSURE WIRE/FFR STUDY;  Surgeon: Nelva Bush, MD;  Location: [place] [facility];   Service: Cardiovascular;  Laterality: N/A;  . IRRIGATION AND DEBRIDEMENT SEBACEOUS CYST Right 03/2011   "pointer" (12/19/2012)  . LEFT HEART CATH AND CORONARY ANGIOGRAPHY N/A 08/13/2020   Procedure: LEFT HEART CATH AND CORONARY ANGIOGRAPHY;  Surgeon: Nelva Bush, MD;  Location: [place] [facility];  Service: Cardiovascular;  Laterality: N/A;  . LEFT HEART CATHETERIZATION WITH CORONARY ANGIOGRAM N/A 07/06/2011   Procedure: LEFT HEART CATHETERIZATION WITH CORONARY ANGIOGRAM;  Surgeon: Jettie Booze, MD;  Location: [facility] CATH LAB;  Service: Cardiovascular;  Laterality: N/A;  possible PCI  . LEFT HEART CATHETERIZATION WITH CORONARY ANGIOGRAM N/A 12/19/2012   Procedure: LEFT HEART CATHETERIZATION WITH CORONARY ANGIOGRAM;  Surgeon: Jettie Booze, MD;  Location: [facility] CATH LAB;  Service: Cardiovascular;  Laterality: N/A;  . MEMBRANE PEEL Left 07/16/2013   Procedure: MEMBRANE PEEL;  Surgeon: Adonis Brook, MD;  Location: [place];  Service: Ophthalmology;  Laterality: Left;  . PARS PLANA VITRECTOMY Left 07/16/2013   Procedure: PARS PLANA VITRECTOMY WITH 23 GAUGE;  Surgeon: Adonis Brook, MD;  Location: [place];  Service: Ophthalmology;  Laterality: Left;  . PARS PLANA VITRECTOMY Left 07/30/2013   Procedure: PARS PLANA VITRECTOMY WITH 23 GAUGE WITH ENDOLASER;  Surgeon: Adonis Brook, MD;  Location: [place];  Service: Ophthalmology;  Laterality: Left;  with endolaser  . PERCUTANEOUS CORONARY STENT INTERVENTION (PCI-S) N/A 07/06/2011   Procedure: PERCUTANEOUS CORONARY STENT INTERVENTION (PCI-S);  Surgeon: Jettie Booze, MD;  Location: [facility] CATH LAB;  Service: Cardiovascular;  Laterality: N/A;  . PHOTOCOAGULATION WITH LASER Left 07/16/2013   Procedure: PHOTOCOAGULATION WITH LASER;  Surgeon: Adonis Brook, MD;  Location: [place];  Service: Ophthalmology;  Laterality: Left;  ENDOLASER   Social History   Socioeconomic History  . Marital status: Divorced    Spouse name: Not on file  . Number of children: 1  .  Years of education: 23  . Highest education level: [employer] school graduate  Occupational History  . Occupation: Disabled   Tobacco Use  . Smoking status: Current Every Day Smoker    Packs/day: 0.50    Years: 22.00    Pack years: 11.00    Types: Cigarettes  . Smokeless tobacco: Never Used  . Tobacco comment: Has been to classes--not sure if she wants to quit.  Changing to non menthol..  Chantix, patches, gum never helped.  9/19:  lot going on.  Vaping Use  . Vaping Use: Never used  Substance and Sexual Activity  . Alcohol use: Yes    Alcohol/week: 0.0 standard drinks    Comment: Extremely rare--once yearly or less.  . Drug use: No    Types: "Crack" cocaine, Marijuana    Comment: 12/31/2012 " clean from crack.  Does still smoke marijuana   . Sexual activity: Not Currently    Partners: Male    Birth control/protection: None  Other Topics Concern  . Not on file  Social History Narrative   Has lived in [place] for most of life   Disabled   Previously went to [place] and worked as Scientist, water quality.   Lives by herself near [place].   Divorced in 2015.    She is caring for a middle school aged girl, Mackey Birchwood, while her father goes through rehab.     Social Determinants of Health   Financial Resource Strain: Low Risk   . Difficulty of Paying Living Expenses: Not very hard  Food Insecurity: No Food Insecurity  . Worried About Charity fundraiser in the Last Year: Never true  . Ran Out of Food in the Last Year: Never true  Transportation Needs: No Transportation Needs  . Lack of Transportation (Medical): No  . Lack of Transportation (Non-Medical): No  Physical Activity: Not on file  Stress: Not on file  Social Connections: Not on file  Intimate Partner Violence: Not At Risk  . Fear of Current or Ex-Partner: No  . Emotionally Abused: No  . Physically Abused: No  . Sexually Abused: No     Review of Systems  HENT: Negative for dental problem (Has dentist appt next  week.).   Eyes: Positive for visual disturbance (last visit last year.  She is followed at [facility]).  Respiratory: Negative for shortness of breath.   Cardiovascular: Positive for chest pain (hospitalized recently).  Gastrointestinal: Negative for abdominal pain and blood in stool.      Objective:   BP 129/72 (BP Location: Left Arm, Patient Position: Sitting, Cuff Size: Normal)   Pulse 79   Resp 12   Ht '5\' 1"'  (1.549 m)   Wt 159 lb 8 oz (72.3 kg)   LMP 01/12/2015   BMI 30.14 kg/m   Physical Exam HENT:     Head: Normocephalic and atraumatic.     Right Ear: Tympanic membrane, ear canal and external ear normal.     Left Ear: Tympanic membrane, ear canal and external ear normal.     Nose: Nose normal.     Mouth/Throat:     Mouth: Mucous membranes are moist.     Pharynx: Oropharynx is clear.  Eyes:     Comments: Right pupil round and reactive to light.   Left cornea clouded over  Neck:     Thyroid: No thyroid mass.  Cardiovascular:     Rate and Rhythm: Normal rate and regular rhythm.     Pulses:          Dorsalis pedis pulses are 2+ on the right side and 2+ on the left side.       Posterior tibial pulses are 2+ on the right side and 2+ on the left side.     Heart sounds: S1 normal and S2 normal. No murmur heard. No friction rub. No S3 or S4 sounds.      Comments: No carotid  bruits.  Carotid, radial, femoral, DP and PT pulses normal and equal.  Pulmonary:     Effort: Pulmonary effort is normal.     Breath sounds: Normal breath sounds.  Chest:  Breasts:     Right: No mass, nipple discharge, tenderness, axillary adenopathy or supraclavicular adenopathy.     Left: No mass, nipple discharge, tenderness, axillary adenopathy or supraclavicular adenopathy.    Abdominal:     General: Bowel sounds are normal.     Palpations: Abdomen is soft. There is no hepatomegaly, splenomegaly or mass.     Tenderness: There is no abdominal tenderness.     Hernia: No hernia is present.   Genitourinary:    Comments: Normal external female genitalia.   No uterine or adnexal mass or tenderness. Musculoskeletal:        General: Normal range of motion.     Cervical back: Normal range of motion and neck supple.  Feet:     Right foot:     Protective Sensation: 10 sites tested. 10 sites sensed.     Skin integrity: Skin integrity normal.     Toenail Condition: Right toenails are abnormally thick.     Left foot:     Protective Sensation: 10 sites tested. 10 sites sensed.     Skin integrity: Skin integrity normal.     Toenail Condition: Left toenails are abnormally thick.     Comments: Crusting of toenails Lymphadenopathy:     Head:     Right side of head: No submental or submandibular adenopathy.     Left side of head: No submental or submandibular adenopathy.     Cervical: No cervical adenopathy.     Upper Body:     Right upper body: No supraclavicular or axillary adenopathy.     Left upper body: No supraclavicular or axillary adenopathy.     Lower Body: No right inguinal adenopathy. No left inguinal adenopathy.  Skin:    General: Skin is warm.     Capillary Refill: Capillary refill takes less than 2 seconds.     Findings: No rash.  Neurological:     Mental Status: She is alert.     Cranial Nerves: Cranial nerves are intact.     Sensory: Sensation is intact.     Motor: Motor function is intact.     Coordination: Coordination is intact.     Gait: Gait is intact.     Deep Tendon Reflexes: Reflexes are normal and symmetric.     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  Psychiatric:        Attention and Perception: Attention normal.        Speech: Speech normal.        Behavior: Behavior normal. Behavior is cooperative.      Assessment & Plan  1.  CPE without pap Mammogram due in May Guaiac cards to return in 2 weeks. Next colonoscopy 2024, Dr. Therisa Doyne  2.  Vitamin D deficiency:  To take Vitamin D as prescribed.  3.  Dyslipidemia:  FLP  4.  DM:  Well controlled, but  with significant complications from when previously poorly controlled. Urine microalbumin/crea  5.  Hypertension:  Controlled.  6.  CKD:  Has been stable.  7.  Tobacco abuse:  Discussed possible ways to quit smoking.  She does not seem motivated to quit, however.  Will readdress at next follow up.

## 2020-09-04 LAB — MICROALBUMIN / CREATININE URINE RATIO
Creatinine, Urine: 38.9 mg/dL
Microalb/Creat Ratio: 107 mg/g creat — ABNORMAL HIGH (ref 0–29)
Microalbumin, Urine: 41.7 ug/mL

## 2020-09-07 ENCOUNTER — Other Ambulatory Visit: Payer: Self-pay | Admitting: Internal Medicine

## 2020-09-07 NOTE — Progress Notes (Signed)
THERAPY PROGRESS NOTE   Session Time: 10:30-11:30 Participation Level: Active Behavioral Response: CasualAlertEuthymic Type of Therapy: Individual Therapy Treatment Goals addressed: Anger and Coping   Purpose: LCSW met with client for routine individual therapy to work towards treatment goals: coping skills to manage anger symptoms.   Intervention: LCSW met with client following her primary care visit for routine individual therapy to work towards treatment goals: coping skills to manage anger symptoms. LCSW provided patient opportunity to check in to assess her visit and how she was doing today. LCSW provided reflective listening skill as patient processed recent updates from her home. Patient processed awareness of impact of stress on her health. LCSW praised patient with her recent boundary setting with the individuals in her life who have triggered her anger. Discussed with patient alternatives of approach with the child she is temporarily caring for her. Discussed importance of stress management and taking care of self.   LCSW and patient identified new treatment goals to work towards management of her tobacco use due to providers concern due to increased risk factors. LCSW discussed possible approach of harm reduction for this goal, and to begin brainstormed with patient how to begin process for treatment.  LCSW assessed for SI/HI/command psychosis.  Effectiveness: Patient is alert x4 affect. Patient reports during check in feeling tired. Patient processed she was not aware how difficult it was taking care of a young teen. Patient processed frustration with the father of the teen who continues to be unsupportive financially. Patient did share a recent event at a restaurant where she was frustrated with the cashier due to their response, patient reported she could feel she was about to yell at them so instead she went outside to breath and was able to not react on her emotion. She gave the  teen the money to buy her food so she wouldn't react. Patient identified awareness she wants to change her communication approach with the teen as she recognizes she is a teen not an adult. Patient acknowledged she is doing this action from kindness from her heart but expressed understanding her health is also a priority. Patient agreed with teens father by the end of the semester to assist with teens care. Patient expressed understanding however the alternative as an option. Patient identified awareness from her support system she doesn't need to care for others as she does have love and affection from her loved ones.    Patient shared recent boundary setting with Mr. Darene Lamer as he as continued to reach out to her for food and shelter. Patient reports she did put out his things and has not responded back to his messages. Patient processed recognizing and reflecting on his behaviors were disrespectful and she no longer wants him part of her life. Patient reports it has felt good practicing her boundaries.   Patient shared concerns brought up by provider due to continued smoking habits. Patient reports awareness it has increased to at least 10 cigarettes and usually related to stress or frustration. Patient reports awareness "it is bad" processed how her family been affected with heart conditions and does not want it to happen to her as well. Patient reports she already switched to another brand. Patient agreed to try harm reduction approach and compared this to her addiction journey and agreed to throw a ashtray. Patient reports I don't need a ashtray in my room, reports she has a total of 3. Patient reports she is willing to throw away the ashtray in her room  and agrees by check in for next session to provide the update.    Intervention was effective as patient was able to process, reflect and participate in the session. Progress towards goal is Ongoing. Patient denied active suicidal/homicidal/active  psychosis.  Plan Patient offered next appointment for: 02/18 2pm  Diagnosis: unspecified personality disorder    [name], LCSW 09/07/2020

## 2020-09-08 ENCOUNTER — Encounter: Payer: Self-pay | Admitting: Nurse Practitioner

## 2020-09-08 ENCOUNTER — Ambulatory Visit (INDEPENDENT_AMBULATORY_CARE_PROVIDER_SITE_OTHER): Payer: Medicaid Other | Admitting: Nurse Practitioner

## 2020-09-08 ENCOUNTER — Other Ambulatory Visit: Payer: Self-pay

## 2020-09-08 VITALS — BP 102/68 | HR 89 | Ht 60.0 in | Wt 162.0 lb

## 2020-09-08 DIAGNOSIS — Z720 Tobacco use: Secondary | ICD-10-CM | POA: Diagnosis not present

## 2020-09-08 DIAGNOSIS — E785 Hyperlipidemia, unspecified: Secondary | ICD-10-CM | POA: Diagnosis not present

## 2020-09-08 DIAGNOSIS — I25119 Atherosclerotic heart disease of native coronary artery with unspecified angina pectoris: Secondary | ICD-10-CM

## 2020-09-08 NOTE — Patient Instructions (Addendum)
After Visit Summary:  We will be checking the following labs today - BMET & CBC   Medication Instructions:    Continue with your current medicines.    If you need a refill on your cardiac medications before your next appointment, please call your pharmacy.     Testing/Procedures To Be Arranged:  N/A  Follow-Up:   See Dr. Eldridge Dace in about 4 to 6 weeks with fasting labs.     At [facility], you and your health needs are our priority.  As part of our continuing mission to provide you with exceptional heart care, we have created designated Provider Care Teams.  These Care Teams include your primary Cardiologist (physician) and Advanced Practice Providers (APPs -  Physician Assistants and Nurse Practitioners) who all work together to provide you with the care you need, when you need it.  Special Instructions:  . Stay safe, wash your hands for at least 20 seconds and wear a mask when needed.     Call the [facility] office at [phone] if you have any questions, problems or concerns.

## 2020-09-09 LAB — CBC
Hematocrit: 40.6 % (ref 34.0–46.6)
Hemoglobin: 13.2 g/dL (ref 11.1–15.9)
MCH: 28.8 pg (ref 26.6–33.0)
MCHC: 32.5 g/dL (ref 31.5–35.7)
MCV: 89 fL (ref 79–97)
Platelets: 361 10*3/uL (ref 150–450)
RBC: 4.59 x10E6/uL (ref 3.77–5.28)
RDW: 16 % — ABNORMAL HIGH (ref 11.7–15.4)
WBC: 10.6 10*3/uL (ref 3.4–10.8)

## 2020-09-09 LAB — BASIC METABOLIC PANEL
BUN/Creatinine Ratio: 26 — ABNORMAL HIGH (ref 9–23)
BUN: 27 mg/dL — ABNORMAL HIGH (ref 6–24)
CO2: 18 mmol/L — ABNORMAL LOW (ref 20–29)
Calcium: 10.3 mg/dL — ABNORMAL HIGH (ref 8.7–10.2)
Chloride: 101 mmol/L (ref 96–106)
Creatinine, Ser: 1.05 mg/dL — ABNORMAL HIGH (ref 0.57–1.00)
GFR calc Af Amer: 70 mL/min/{1.73_m2} (ref >59)
GFR calc non Af Amer: 61 mL/min/{1.73_m2} (ref >59)
Glucose: 72 mg/dL (ref 65–99)
Potassium: 3.9 mmol/L (ref 3.5–5.2)
Sodium: 139 mmol/L (ref 134–144)

## 2020-09-10 ENCOUNTER — Other Ambulatory Visit: Payer: Self-pay

## 2020-09-10 ENCOUNTER — Telehealth: Payer: Self-pay | Admitting: Internal Medicine

## 2020-09-10 ENCOUNTER — Other Ambulatory Visit: Payer: Self-pay | Admitting: Internal Medicine

## 2020-09-10 MED ORDER — CHOLECALCIFEROL 25 MCG (1000 UT) PO CAPS: 1000.0000 [IU] | ORAL_CAPSULE | Freq: Every day | ORAL | 3 refills | Status: DC

## 2020-09-10 MED ORDER — CLOPIDOGREL BISULFATE 75 MG PO TABS: 75.0000 mg | ORAL_TABLET | Freq: Every day | ORAL | 3 refills | Status: DC

## 2020-09-10 NOTE — Telephone Encounter (Signed)
Patient called requesting refill for Vitamin D3 to be sent over to [facility] on [address].   Pt. Shared that she reached out to her heart doctor to get refill for Clopidogrel (Plavix) and they were sending refills.

## 2020-09-10 NOTE — Telephone Encounter (Signed)
Dr. Sharl Ma, [place]'s office called asking for authorization for patient's visit to endocrinology.  Dr. Delrae Alfred authorized 6 visits.

## 2020-09-10 NOTE — Telephone Encounter (Signed)
Pt's medication was sent to pt's pharmacy as requested. Confirmation received.  °

## 2020-09-13 ENCOUNTER — Other Ambulatory Visit: Payer: Self-pay

## 2020-09-13 ENCOUNTER — Other Ambulatory Visit (INDEPENDENT_AMBULATORY_CARE_PROVIDER_SITE_OTHER): Payer: Medicaid Other | Admitting: Internal Medicine

## 2020-09-13 ENCOUNTER — Telehealth (HOSPITAL_COMMUNITY): Payer: Self-pay

## 2020-09-13 DIAGNOSIS — Z23 Encounter for immunization: Secondary | ICD-10-CM | POA: Diagnosis not present

## 2020-09-13 NOTE — Progress Notes (Addendum)
Here for Twinrix--records from 2011 show she is not immune to Hepatitis A nor B Received Hep B only x 2 in past (2016 and 2017)

## 2020-09-13 NOTE — Telephone Encounter (Signed)
Called patient to see if she was interested in participating in the Cardiac Rehab Program. Patient stated yes. Patient will come in for orientation on 3/29 @ 10:30AM and will attend the 1:15PM exercise class.  Pensions consultant.

## 2020-09-17 ENCOUNTER — Telehealth: Payer: Medicaid Other | Admitting: Clinical

## 2020-09-20 ENCOUNTER — Telehealth: Payer: Self-pay | Admitting: Internal Medicine

## 2020-09-20 NOTE — Telephone Encounter (Signed)
Patient called asking for an advise on how to use for fungus on fingernail. Patient stated that she noticed that her left ring finger is getting brown and it has fungus. No odor nor pain. Patient mentioned that she bought a fungi nails treatment over the counter but it does not seems to help. Please advise.

## 2020-09-21 ENCOUNTER — Telehealth (INDEPENDENT_AMBULATORY_CARE_PROVIDER_SITE_OTHER): Payer: Medicaid Other | Admitting: Clinical

## 2020-09-21 DIAGNOSIS — F609 Personality disorder, unspecified: Secondary | ICD-10-CM | POA: Diagnosis not present

## 2020-09-27 NOTE — Progress Notes (Signed)
THERAPY PROGRESS NOTE Via GoogleDuo unable to utilize updox for this session  Session Time: 2pm-3pm Participation Level: Active Behavioral Response: CasualAlertEuthymic Type of Therapy: Individual Therapy Treatment Goals addressed: Learning coping skills to manage anger. Learning coping skills to work towards decreasing tobacco use.    Purpose: LCSW met with client for routine individual therapy to work towards treatment goals: Learning coping skills to manage anger. Learning coping skills to work towards decreasing tobacco use.    Intervention:  LCSW met with client for routine individual therapy to work towards treatment goals: Learning coping skills to manage anger. Learning coping skills to work towards decreasing tobacco use. LCSW provided patient opportunity to check in to assess for any significant events and how she is doing today. LCSW provided reflective listening skills as patient processed recent triggers. Patient shared how she is continuing her boundaries with the person. In this session LCSW and patient continued discussion on tobacco use. LCSW utilized My Tobacco Cessastion workbook chapter 2, to begin LCSW and patient discussed tobacco use by exploring how did patient begin to smoke, and followed "why do I smoke" to discuss triggers. Following this discussion patient and LCSW were able to connect the relationship her use of tobacco and triggers.LCSW utilized intervention of Motivational Interviewing to work towards changing behaviors to decrease her tobacco use as LCSW and patient began to identify trigger and pattern. LCSW and patient discussed her routines and identified for the next two weeks at least once not smoke when she wakes up, to say it out loud and work towards distracting herself with her breakfast routine. LCSW assessed for SI/HI/command psychosis.  Effectiveness: Patient is alert x4 affect. Today patient reports feeling okay as she is looking forward this weekend to  spend her birthday with her daughter and granddaughter. 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 Pt reports she wanted to take her to behavioral health, due to issue of concern LCSW also recommended to reach out to the school counselors to set up an IEP evaluation. Pt reports the teenager has informed she only wants to finish the semester and wants to leave with her father wherever they decide to go. Father of teenager continues to be a rehabilition center. Pt reports although he has been supportive financially with food, other items like her dogs food, clothing etc, the support isn't enough and this is becoming draining for pt. Pt reports feeling a sigh of relief knowing it is teenager who wants to leave. Pt reports she will be going to a cardiac rehab in march and is looking forward to this as it will physical activity, and also something that will help with distraction.   Pt reports she did not throw away the 1 ash tray as agreed last session however she did buy a vape. Patient reports she did not like the taste of the vape. Reports out of a carton, 10 cigarrettes- 3 a week but it lasting the whole month. Patient shared her history of tobacco use reporting at age 55 is when she began to use it. Reports the first time she did not like it. Reports people around her were smoking such as the father of her daughter, mom and sister. Patient reports she remembers seeing her mom and partner at the time be calm when they would smoke. When mom and sister died, reports it is when she starting smoking cigarrettes and selling cocaine.  Pt reports now her cigarrette use is to physically be calm, to not get Ziomek or drink. Patient notes awareness currently her use is like a cushion to not want to hurt herself or others. Patient reports triggers is often waking up in the morning (it's become a habit) to have  a good day, talking on the phone with certain people, stress, some pain, feeling angry/anxious, seeing someone else smoke, sometimes when feeling bored and at time when completing a task. Patient reports smoking to not eat as she reports weight concerns has been an issue. Patient report she is already making changes for example when triggered if able she will begin to clean. With the person setting boundaries although they still find a way to talk to her. Patient agreed to acknowledge the craving in the morning and will work towards distracting herself during morning routine.  Intervention was effective as patient was able to participate, engaged and able to reflect on her pattern of use along with her trigger. Progress towards goal is Ongoing. Patient denied active suicidal/homicidal/active psychosis.  Plan Patient offered next appointment for: 03/8  Diagnosis: Unspecified personality disorder    [name], LCSW 09/27/2020

## 2020-10-01 ENCOUNTER — Encounter: Payer: Self-pay | Admitting: Internal Medicine

## 2020-10-01 ENCOUNTER — Ambulatory Visit (INDEPENDENT_AMBULATORY_CARE_PROVIDER_SITE_OTHER): Payer: Medicaid Other | Admitting: Internal Medicine

## 2020-10-01 ENCOUNTER — Other Ambulatory Visit: Payer: Self-pay

## 2020-10-01 VITALS — BP 106/60 | HR 100 | Resp 20 | Ht 61.0 in | Wt 160.0 lb

## 2020-10-01 DIAGNOSIS — B351 Tinea unguium: Secondary | ICD-10-CM

## 2020-10-01 MED ORDER — TERBINAFINE HCL 250 MG PO TABS: 250.0000 mg | ORAL_TABLET | Freq: Every day | ORAL | 0 refills | Status: DC

## 2020-10-01 NOTE — Progress Notes (Signed)
    Subjective:    Patient ID: [name] [name], female   DOB: 05-May-1966, 55 y.o.   MRN: [id number]   HPI   Left ring finger with discoloration at base and growing out.  Nail is thickened.  The skin at base of nail is a bit erythematous and has had pus from under cuticle.    No outpatient medications have been marked as taking for the 10/01/20 encounter (Office Visit) with Julieanne Manson, MD.   Allergies  Allergen Reactions  . Aspirin Other (See Comments)    Stomach bleeds      Review of Systems    Objective:   BP 106/60 (BP Location: Right Arm, Patient Position: Sitting, Cuff Size: Normal)   Pulse 100   Resp 20   Ht 5\' 1"  (1.549 m)   Wt 160 lb (72.6 kg)   LMP 01/12/2015   BMI 30.23 kg/m   Physical Exam Left ring fingernail with thickening, brown orange discoloration and irregular surface at base and about halfway down her nail.  Nails are all long and somewhat unkempt.  Base of nail with mildly swollen cuticle and surrounding skin.  No current discharge.  Assessment & Plan   Left ring finger onychomycosis with soft tissue involvement as well:  Terbinainfe 250 mg daily for 6 weeks.  Hepatic profile and follow up with me thereafter.   Call if worsens.

## 2020-10-03 ENCOUNTER — Encounter: Payer: Self-pay | Admitting: Internal Medicine

## 2020-10-03 NOTE — Progress Notes (Signed)
Labs from Dr. Sharl Ma, Endocrinology dated 06/13/20:  A1C:  7.5%  Dated 09/13/20:  Cholesterol:  175 with HDL 77 and LDL 82.  Trigs:  86

## 2020-10-03 NOTE — Progress Notes (Unsigned)
Cardiology Office Note   Date:  10/04/2020   ID:  [name] [name], DOB July 03, 1966, MRN [id number]  PCP:  [name] Hook, MD    No chief complaint on file.  CAD  Wt Readings from Last 3 Encounters:  10/04/20 164 lb 9.6 oz (74.7 kg)  10/01/20 160 lb (72.6 kg)  09/08/20 162 lb (73.5 kg)       History of Present Illness: [name] [name] is a 55 y.o. 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 She had a non-STEMI in May of 2014 and had a stent placed in her circumflex. This was a drug-eluting stent. She had several eye surgeries in late 09/30/2012 and early September 30, 2013. She had bleeding issues at that time and her Plavix was stopped before she completed her 43 month course post MI ( May 2014). She had not been on aspirin due to allergy. Her vision in her left eye is severely decreased.   Her sister passed away from cancerin 09-30-16 and she wentthrough menopause.  She had visual issues due to bleeding after eye surgery.  She mentionedto me that she was involved in a lawsuit with the ophthalmologist, but she states that she had to stop the case due to signing consent. She did call the medical board about this MD.   She had chronic chest pain noted in Sep 30, 2018, and October 01, 2019 after she fel off the bed.   Further chest pain led to cath in September 30, 2020: 1. "Multivessel coronary artery disease, including sequential 60-70% and 20-30% mid LAD stenoses (DFR significant @ 0.85), 40% mid LCx lesion, and chronic total occlusion of mid/distal RCA.  The distal RCA branches fill via faint left-to-right collaterals. 2. Widely patent mid/distal LCx stent. 3. Severe in-stent restenosis of RCA with 70% proximal disease and chronic total occlusion of the mid and distal segments. 4. Normal left ventricular filling pressure. 5. Successful DFR-guided PCI to mid LAD using Resolute Onyx 2.75 x 26 mm drug eluting stent (postdilated to 3.1 mm) with 10% residual stenosis and TIMI-3 flow.  Recommendations: 1. Dual  antiplatelet therapy with aspirin and clopidogrel for up to 1 month (if tolerated) followed by indefinite clopidogrel 75 mg daily. 2. Aggressive secondary prevention of coronary artery disease."  Intolerant of nicotine patches and lozenges. Intolerant to aspirin due to stomach issues.   Denies : Chest pain. Dizziness. Leg edema. Nitroglycerin use. Orthopnea. Palpitations. Paroxysmal nocturnal dyspnea. Shortness of breath. Syncope.   I  Past Medical History:  Diagnosis Date  . Acute myocardial infarction of other lateral wall, initial episode of care   . Acute myocardial infarction, unspecified site, initial episode of care   . Acute osteomyelitis   . Allergic rhinitis   . Anemia   . Anginal pain ([place])    07/15/13- no chest pain in months"  . Anxiety   . Bipolar affective ([place])   . CAD (coronary artery disease) 07/2011   s/p DES mid and distal RCA with 50% LAD  . CKD stage 1 due to type 2 diabetes mellitus ([place])   . Daily headache    not daily  . Depression    Bipolar disorder  . Diabetic foot ulcer associated with diabetes mellitus due to underlying condition ([place]) 09/26/2018  . Diabetic gastroparesis associated with type 2 diabetes mellitus ([place])   . Diabetic peripheral neuropathy associated with type 2 diabetes mellitus ([place])   . Diabetic retinopathy   . Esophageal stenosis   . Esophageal ulcer   .  Esophagitis   . Gastroparesis   . Genital herpes    Reportedly tested and documented by [facility]--rare occurrences  . GERD (gastroesophageal reflux disease)   . Heart murmur   . History of stomach ulcers   . Hyperlipidemia   . Hypertension   . Inferior MI ([place]) 01/20/2009   [name] [name] on 12/19/2012, "that's the only one I've had" (12/19/2012)  . Migraines   . Orthopnoea   . Pneumonia 2012  . Polysubstance abuse ([place])    Crack cocaine--none since 2008, MJ, ETOH:  clean of all since 2008  . Renal insufficiency   . Renal lithiasis    Bilateral  . Rheumatoid arthritis(714.0)    . Sebaceous cyst   . Stroke [facility]) 2011   denies residual on 12/19/2012.  "Years ago"  . Type II diabetes mellitus ([facility])    Previously uncontrolled for many years with multiple complications.  2017 controlled. 05/2016:  6.7%    Past Surgical History:  Procedure Laterality Date  . CORONARY ANGIOPLASTY WITH STENT PLACEMENT  01/20/2009   "2" (12/19/2012)  . CORONARY ANGIOPLASTY WITH STENT PLACEMENT  2012   "2" (12/19/2012)  . CORONARY ANGIOPLASTY WITH STENT PLACEMENT  12/19/2012   "2" (12/19/2012)  . CORONARY STENT INTERVENTION N/A 08/13/2020   Procedure: CORONARY STENT INTERVENTION;  Surgeon: [name] Bush, MD;  Location: [facility];  Service: Cardiovascular;  Laterality: N/A;  . ESOPHAGOGASTRODUODENOSCOPY N/A 02/26/2015   Procedure: ESOPHAGOGASTRODUODENOSCOPY (EGD);  Surgeon: [name] Irani, MD;  Location: [facility] ENDOSCOPY;  Service: Endoscopy;  Laterality: N/A;  . EYE SURGERY     Multiple surgeries of both eyes:  last laser was 09/08/2015 of right eye.  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 GAS INSERTION Left 07/16/2013   Procedure: INSERTION OF GAS;  Surgeon: [name] Brook, MD;  Location: [facility];  Service: Ophthalmology;  Laterality: Left;  SF6  . GAS/FLUID EXCHANGE Left 07/30/2013   Procedure: GAS/FLUID EXCHANGE;  Surgeon: [name] Brook, MD;  Location: [place];  Service: Ophthalmology;  Laterality: Left;  . INTRAVASCULAR PRESSURE WIRE/FFR STUDY N/A 08/13/2020   Procedure: INTRAVASCULAR PRESSURE WIRE/FFR STUDY;  Surgeon: [name] Bush, MD;  Location: [facility];  Service: Cardiovascular;  Laterality: N/A;  . IRRIGATION AND DEBRIDEMENT SEBACEOUS CYST Right 03/2011   "pointer" (12/19/2012)  . LEFT HEART CATH AND CORONARY ANGIOGRAPHY N/A 08/13/2020   Procedure: LEFT HEART CATH AND CORONARY ANGIOGRAPHY;  Surgeon: [name] Bush, MD;  Location: [facility];  Service: Cardiovascular;  Laterality: N/A;  . LEFT HEART CATHETERIZATION WITH CORONARY ANGIOGRAM N/A  07/06/2011   Procedure: LEFT HEART CATHETERIZATION WITH CORONARY ANGIOGRAM;  Surgeon: [name] Booze, MD;  Location: [facility] C[facility] LAB;  Service: Cardiovascular;  Laterality: N/A;  possible PCI  . LEFT HEART CATHETERIZATION WITH CORONARY ANGIOGRAM N/A 12/19/2012   Procedure: LEFT HEART CATHETERIZATION WITH CORONARY ANGIOGRAM;  Surgeon: [name] Booze, MD;  Location: [facility] C[facility] LAB;  Service: Cardiovascular;  Laterality: N/A;  . MEMBRANE PEEL Left 07/16/2013   Procedure: MEMBRANE PEEL;  Surgeon: [name] Brook, MD;  Location: [place];  Service: Ophthalmology;  Laterality: Left;  . PARS PLANA VITRECTOMY Left 07/16/2013   Procedure: PARS PLANA VITRECTOMY WITH 23 GAUGE;  Surgeon: [name] Brook, MD;  Location: [place] [place];  Service: Ophthalmology;  Laterality: Left;  . PARS PLANA VITRECTOMY Left 07/30/2013   Procedure: PARS PLANA VITRECTOMY WITH 23 GAUGE WITH ENDOLASER;  Surgeon: [name] Brook, MD;  Location: [place];  Service: Ophthalmology;  Laterality: Left;  with endolaser  . PERCUTANEOUS CORONARY STENT INTERVENTION (PCI-S) N/A 07/06/2011   Procedure: PERCUTANEOUS CORONARY STENT INTERVENTION (PCI-S);  Surgeon: [name] Booze, MD;  Location: [facility] CATH LAB;  Service: Cardiovascular;  Laterality: N/A;  . PHOTOCOAGULATION WITH LASER Left 07/16/2013   Procedure: PHOTOCOAGULATION WITH LASER;  Surgeon: [name] Brook, MD;  Location: [place];  Service: Ophthalmology;  Laterality: Left;  ENDOLASER     Current Outpatient Medications  Medication Sig Dispense Refill  . Accu-Chek FastClix Lancets MISC CHECK BLOOD SUGAR 5 TIMES DAILY    . ACCU-CHEK GUIDE test strip USE AS DIRECTED CHECK BLOOD SUGAR 5 TIMES A DAY    . acetaminophen (TYLENOL) 500 MG tablet Take 500-1,000 mg by mouth every 6 (six) hours as needed for moderate pain.     Marland Kitchen albuterol (PROVENTIL HFA;VENTOLIN HFA) 108 (90 Base) MCG/ACT inhaler Inhale 2 puffs into the lungs every 6 (six) hours as needed for wheezing or shortness of breath. 1 Inhaler 0  . ammonium  lactate (AMLACTIN) 12 % cream Apply topically as needed for dry skin. 385 g 0  . Blood Glucose Monitoring Suppl (ACCU-CHEK GUIDE ME) w/Device KIT USE TO MONITOR BLOOD SUGAR FIVE TIMES A DAY DX  E10.22 IN VITRO    . Cholecalciferol 25 MCG (1000 UT) capsule Take 1 capsule (1,000 Units total) by mouth daily. 30 capsule 3  . clopidogrel (PLAVIX) 75 MG tablet Take 1 tablet (75 mg total) by mouth daily. 90 tablet 3  . cyclobenzaprine (FLEXERIL) 10 MG tablet Take 1-2 tablets by mouth at bedtime as needed for muscle spasms. Muscle spasms  5  . diphenhydrAMINE (BENADRYL) 25 MG tablet Take 25 mg by mouth every 6 (six) hours as needed for itching or allergies.    . folic acid (FOLVITE) 409 MCG tablet Take 1,200 mcg by mouth daily.    . furosemide (LASIX) 40 MG tablet TAKE 1 TABLET BY MOUTH EVERY DAY IN THE MORNING 30 tablet 11  . gabapentin (NEURONTIN) 100 MG capsule TAKE 1 CAPSULE BY MOUTH AT BEDTIME. 30 capsule 0  . Incontinence Supply Disposable (POISE ULTRA THINS) PADS Use 5 pads daily as needed for urinary stress incontinence 150 each 11  . insulin aspart (NOVOLOG) 100 UNIT/ML injection Inject 4-16 Units into the skin 3 (three) times daily with meals. Per Sliding scale CBG 100-150: 4 units, 151-200: 6 units, 201-250: 8 units, 251-300: 10 units, 301-350: 12 units, 351-400: 14 units, > 401; call doctor    . LANTUS SOLOSTAR 100 UNIT/ML Solostar Pen Inject 10 Units into the skin 2 (two) times daily.   3  . methotrexate (RHEUMATREX) 2.5 MG tablet Take 10 mg by mouth every Wednesday.    . metoprolol tartrate (LOPRESSOR) 25 MG tablet TAKE 1 TABLET BY MOUTH TWICE A DAY 60 tablet 11  . nitroGLYCERIN (NITROSTAT) 0.4 MG SL tablet Place 1 tablet (0.4 mg total) under the tongue every 5 (five) minutes as needed for chest pain. Max 3 doses, call 911. 25 tablet 7  . ondansetron (ZOFRAN) 4 MG tablet TAKE 1 TABLET (4 MG TOTAL) BY MOUTH EVERY 6 (SIX) HOURS. 12 tablet 0  . pantoprazole (PROTONIX) 40 MG tablet Take 40 mg by  mouth daily.    . prednisoLONE acetate (PRED FORTE) 1 % ophthalmic suspension Place 4-5 drops into the left eye daily.  6  . rosuvastatin (CRESTOR) 40 MG tablet Take 1 tablet (40 mg total) by mouth daily. 90 tablet 3  . sertraline (ZOLOFT) 100 MG tablet Take 150 mg by mouth  daily.    . terbinafine (LAMISIL) 250 MG tablet Take 1 tablet (250 mg total) by mouth daily. 42 tablet 0  . traMADol (ULTRAM) 50 MG tablet TAKE 1 TABLET EVERY 6 HOURS AS NEEDED FOR MODERATE PAIN 90 tablet 2  . famotidine (PEPCID) 20 MG tablet Take 20 mg by mouth 2 (two) times daily.     No current facility-administered medications for this visit.    Allergies:   Aspirin    Social History:  The patient  reports that she has been smoking cigarettes. She has a 11.00 pack-year smoking history. She has never used smokeless tobacco. She reports current alcohol use. She reports that she does not use drugs.   Family History:  The patient's family history includes Breast cancer (age of onset: 66) in her mother; Cancer (age of onset: 53) in her sister; Hypertension in her father and sister; Lung cancer (age of onset: 98) in her sister; Stomach cancer (age of onset: 96) in her sister.    ROS:  Please see the history of present illness.   Otherwise, review of systems are positive for rare low blood sugars.   All other systems are reviewed and negative.    PHYSICAL EXAM: VS:  BP 128/72   Pulse 86   Ht '5\' 1"'  (1.549 m)   Wt 164 lb 9.6 oz (74.7 kg)   LMP 01/12/2015   SpO2 99%   BMI 31.10 kg/m  , BMI Body mass index is 31.1 kg/m. GEN: Well nourished, well developed, in no acute distress  HEENT: normal  Neck: no JVD, carotid bruits, or masses Cardiac: RRR; no murmurs, rubs, or gallops,no edema  Respiratory:  clear to auscultation bilaterally, normal work of breathing GI: soft, nontender, nondistended, + BS MS: no deformity or atrophy  Skin: warm and dry, no rash Neuro:  Strength and sensation are intact Psych: euthymic  mood, full affect   EKG:   The ekg ordered 1/22 demonstrates NSR, inf ST changes   Recent Labs: 10/23/2019: TSH 1.030 03/25/2020: ALT 8 09/08/2020: BUN 27; Creatinine, Ser 1.05; Hemoglobin 13.2; Platelets 361; Potassium 3.9; Sodium 139   Lipid Panel    Component Value Date/Time   CHOL 149 09/08/2019 1503   TRIG 66 09/08/2019 1503   HDL 70 09/08/2019 1503   CHOLHDL 1.9 01/30/2019 0848   CHOLHDL 2.1 06/16/2018 0642   VLDL 14 06/16/2018 0642   LDLCALC 66 09/08/2019 1503     Other studies Reviewed: Additional studies/ records that were reviewed today with results demonstrating: cath results reviewed.labs reviewed   ASSESSMENT AND PLAN:  1. CAD/Old MI: No angina. Starting cardiac rehab. Continue clopidogrel monotherapy.  No bleeding problems. No aspirin due to intolerance.  2. DM: A1C 6.7.  Continue lifestyle modifications.  Hopefully, she can reduce insulin dose with healthy, [place] fiber diet.   3. Hyperlipidemia: LDL 82.  The current medical regimen is effective;  continue present plan and medications. 4. Hypertensive heart disease: The current medical regimen is effective;  continue present plan and medications. 5. Tobacco abuse: Still smoking.  Down to 1/2 ppd approximately.  Cutting back gradually.  Skin irritation with nicotine patch.  Wants to use nicotine gum.    Current medicines are reviewed at length with the patient today.  The patient concerns regarding her medicines were addressed.  The following changes have been made:  No change  Labs/ tests ordered today include:  No orders of the defined types were placed in this encounter.   Recommend 150 minutes/week  of aerobic exercise Low fat, low carb, Serfass fiber diet recommended  Disposition:   FU in 1 year   Signed, Larae Grooms, MD  10/04/2020 9:58 AM    [facility] [address] [place], [place], [place]  [address] Phone: [phone]; Fax: [phone]

## 2020-10-04 ENCOUNTER — Other Ambulatory Visit: Payer: Self-pay

## 2020-10-04 ENCOUNTER — Ambulatory Visit: Payer: Medicaid Other | Admitting: Interventional Cardiology

## 2020-10-04 ENCOUNTER — Encounter: Payer: Self-pay | Admitting: Interventional Cardiology

## 2020-10-04 VITALS — BP 128/72 | HR 86 | Ht 61.0 in | Wt 164.6 lb

## 2020-10-04 DIAGNOSIS — I25119 Atherosclerotic heart disease of native coronary artery with unspecified angina pectoris: Secondary | ICD-10-CM

## 2020-10-04 DIAGNOSIS — I10 Essential (primary) hypertension: Secondary | ICD-10-CM

## 2020-10-04 DIAGNOSIS — E785 Hyperlipidemia, unspecified: Secondary | ICD-10-CM | POA: Diagnosis not present

## 2020-10-04 DIAGNOSIS — E1159 Type 2 diabetes mellitus with other circulatory complications: Secondary | ICD-10-CM | POA: Diagnosis not present

## 2020-10-04 DIAGNOSIS — Z720 Tobacco use: Secondary | ICD-10-CM | POA: Diagnosis not present

## 2020-10-04 NOTE — Patient Instructions (Signed)
Medication Instructions:  Your physician recommends that you continue on your current medications as directed. Please refer to the Current Medication list given to you today.  *If you need a refill on your cardiac medications before your next appointment, please call your pharmacy*   Lab Work: none If you have labs (blood work) drawn today and your tests are completely normal, you will receive your results only by: . MyChart Message (if you have MyChart) OR . A paper copy in the mail If you have any lab test that is abnormal or we need to change your treatment, we will call you to review the results.   Testing/Procedures: none   Follow-Up: At [facility], you and your health needs are our priority.  As part of our continuing mission to provide you with exceptional heart care, we have created designated Provider Care Teams.  These Care Teams include your primary Cardiologist (physician) and Advanced Practice Providers (APPs -  Physician Assistants and Nurse Practitioners) who all work together to provide you with the care you need, when you need it.  We recommend signing up for the patient portal called "MyChart".  Sign up information is provided on this After Visit Summary.  MyChart is used to connect with patients for Virtual Visits (Telemedicine).  Patients are able to view lab/test results, encounter notes, upcoming appointments, etc.  Non-urgent messages can be sent to your provider as well.   To learn more about what you can do with MyChart, go to [email or web address].    Your next appointment:   12 month(s)  The format for your next appointment:   In Person  Provider:   You may see Jayadeep Varanasi, MD or one of the following Advanced Practice Providers on your designated Care Team:    Dayna Dunn, PA-C  Michele Lenze, PA-C    Other Instructions  [place]-Fiber Eating Plan Fiber, also called dietary fiber, is a type of carbohydrate. It is found foods such as fruits,  vegetables, whole grains, and beans. A [place]-fiber diet can have many health benefits. Your health care provider may recommend a Brenton-fiber diet to help:  Prevent constipation. Fiber can make your bowel movements more regular.  Lower your cholesterol.  Relieve the following conditions: ? Inflammation of veins in the anus (hemorrhoids). ? Inflammation of specific areas of the digestive tract (uncomplicated diverticulosis). ? A problem of the large intestine, also called the colon, that sometimes causes pain and diarrhea (irritable bowel syndrome, or IBS).  Prevent overeating as part of a weight-loss plan.  Prevent heart disease, type 2 diabetes, and certain cancers. What are tips for following this plan? Reading food labels  Check the nutrition facts label on food products for the amount of dietary fiber. Choose foods that have 5 grams of fiber or more per serving.  The goals for recommended daily fiber intake include: ? Men (age 50 or younger): 34-38 g. ? Men (over age 50): 28-34 g. ? Women (age 50 or younger): 25-28 g. ? Women (over age 50): 22-25 g. Your daily fiber goal is _____________ g.   Shopping  Choose whole fruits and vegetables instead of processed forms, such as apple juice or applesauce.  Choose a wide variety of Boomer-fiber foods such as avocados, lentils, oats, and kidney beans.  Read the nutrition facts label of the foods you choose. Be aware of foods with added fiber. These foods often have Niese sugar and sodium amounts per serving. Cooking  Use whole-grain flour for baking and cooking.    Cook with brown rice instead of white rice. Meal planning  Start the day with a breakfast that is Milo in fiber, such as a cereal that contains 5 g of fiber or more per serving.  Eat breads and cereals that are made with whole-grain flour instead of refined flour or white flour.  Eat brown rice, bulgur wheat, or millet instead of white rice.  Use beans in place of meat in  soups, salads, and pasta dishes.  Be sure that half of the grains you eat each day are whole grains. General information  You can get the recommended daily intake of dietary fiber by: ? Eating a variety of fruits, vegetables, grains, nuts, and beans. ? Taking a fiber supplement if you are not able to take in enough fiber in your diet. It is better to get fiber through food than from a supplement.  Gradually increase how much fiber you consume. If you increase your intake of dietary fiber too quickly, you may have bloating, cramping, or gas.  Drink plenty of water to help you digest fiber.  Choose [place]-fiber snacks, such as berries, raw vegetables, nuts, and popcorn. What foods should I eat? Fruits Berries. Pears. Apples. Oranges. Avocado. Prunes and raisins. Dried figs. Vegetables Sweet potatoes. Spinach. Kale. Artichokes. Cabbage. Broccoli. Cauliflower. Green peas. Carrots. Squash. Grains Whole-grain breads. Multigrain cereal. Oats and oatmeal. Brown rice. Barley. Bulgur wheat. Millet. Quinoa. Bran muffins. Popcorn. Rye wafer crackers. Meats and other proteins Navy beans, kidney beans, and pinto beans. Soybeans. Split peas. Lentils. Nuts and seeds. Dairy Fiber-fortified yogurt. Beverages Fiber-fortified soy milk. Fiber-fortified orange juice. Other foods Fiber bars. The items listed above may not be a complete list of recommended foods and beverages. Contact a dietitian for more information. What foods should I avoid? Fruits Fruit juice. Cooked, strained fruit. Vegetables Fried potatoes. Canned vegetables. Well-cooked vegetables. Grains White bread. Pasta made with refined flour. White rice. Meats and other proteins Fatty cuts of meat. Fried chicken or fried fish. Dairy Milk. Yogurt. Cream cheese. Sour cream. Fats and oils Butters. Beverages Soft drinks. Other foods Cakes and pastries. The items listed above may not be a complete list of foods and beverages to avoid.  Talk with your dietitian about what choices are best for you. Summary  Fiber is a type of carbohydrate. It is found in foods such as fruits, vegetables, whole grains, and beans.  A Bily-fiber diet has many benefits. It can help to prevent constipation, lower blood cholesterol, aid weight loss, and reduce your risk of heart disease, diabetes, and certain cancers.  Increase your intake of fiber gradually. Increasing fiber too quickly may cause cramping, bloating, and gas. Drink plenty of water while you increase the amount of fiber you consume.  The best sources of fiber include whole fruits and vegetables, whole grains, nuts, seeds, and beans. This information is not intended to replace advice given to you by your health care provider. Make sure you discuss any questions you have with your health care provider. Document Revised: 11/20/2019 Document Reviewed: 11/20/2019 Elsevier Patient Education  2021 Elsevier Inc.   

## 2020-10-05 ENCOUNTER — Telehealth: Payer: Medicaid Other | Admitting: Clinical

## 2020-10-06 ENCOUNTER — Telehealth (INDEPENDENT_AMBULATORY_CARE_PROVIDER_SITE_OTHER): Payer: Medicaid Other | Admitting: Clinical

## 2020-10-06 DIAGNOSIS — F609 Personality disorder, unspecified: Secondary | ICD-10-CM | POA: Diagnosis not present

## 2020-10-08 DIAGNOSIS — L03012 Cellulitis of left finger: Secondary | ICD-10-CM | POA: Insufficient documentation

## 2020-10-11 ENCOUNTER — Ambulatory Visit: Payer: Medicaid Other | Admitting: Podiatry

## 2020-10-11 ENCOUNTER — Other Ambulatory Visit: Payer: Self-pay

## 2020-10-11 DIAGNOSIS — M79674 Pain in right toe(s): Secondary | ICD-10-CM

## 2020-10-11 DIAGNOSIS — B351 Tinea unguium: Secondary | ICD-10-CM

## 2020-10-11 DIAGNOSIS — E1149 Type 2 diabetes mellitus with other diabetic neurological complication: Secondary | ICD-10-CM

## 2020-10-11 DIAGNOSIS — M79675 Pain in left toe(s): Secondary | ICD-10-CM
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
## 2020-10-13 NOTE — Progress Notes (Signed)
Subjective: 55 y.o. returns the office today for painful, elongated, thickened toenails which she cannot trim herself. The calluses have been doing well. No open lesions Denies any systemic complaints such as fevers, chills, nausea, vomiting.   PCP: Julieanne Manson, MD Endocrinologist: Dr. Talmage Coin, MD A1c: 6.7 on 08/12/2020  Objective: AAO 3, NAD DP/PT pulses palpable, CRT less than 3 seconds Sensation decreased with Semmes Weinstein monofilament. Nails hypertrophic, dystrophic, elongated, brittle, discolored 10. There is tenderness overlying the nails 1-5 bilaterally. There is no surrounding erythema or drainage along the nail sites. No open lesions.  Hammertoes present No pain with calf compression, swelling, warmth, erythema.  Assessment: Patient presents with symptomatic onychomycosis  Plan: -Treatment options including alternatives, risks, complications were discussed -Nails sharply debrided 10 without complication/bleeding. -Discussed daily foot inspection. If there are any changes, to call the office immediately.  -Follow-up in 3 months or sooner if any problems are to arise. In the meantime, encouraged to call the office with any questions, concerns, changes symptoms.  Ovid Curd, DPM

## 2020-10-20 ENCOUNTER — Telehealth (INDEPENDENT_AMBULATORY_CARE_PROVIDER_SITE_OTHER): Payer: Medicaid Other | Admitting: Clinical

## 2020-10-20 DIAGNOSIS — F609 Personality disorder, unspecified: Secondary | ICD-10-CM

## 2020-10-21 ENCOUNTER — Telehealth (HOSPITAL_COMMUNITY): Payer: Self-pay | Admitting: Pharmacist

## 2020-10-21 NOTE — Telephone Encounter (Signed)
Cardiac Rehab Medication Review by a Pharmacist  Does the patient  feel that his/her medications are working for him/her?  yes  Has the patient been experiencing any side effects to the medications prescribed?  no  Does the patient measure his/her own blood pressure or blood glucose at home?  Does not measure blood pressure but does not have Sibert blood pressure BG at home typically range from 70-200. Is starting the [employer] glucose monitoring  Does the patient have any problems obtaining medications due to transportation or finances?   no  Understanding of regimen: good Understanding of indications: good  Potential of compliance: excellent   Pharmacist Intervention: Educated on indications and reinforced adherence   Lamar Sprinkles, PharmD PGY1 Pharmacy Resident 10/21/2020 4:49 PM

## 2020-10-25 ENCOUNTER — Other Ambulatory Visit: Payer: Self-pay

## 2020-10-25 ENCOUNTER — Other Ambulatory Visit (INDEPENDENT_AMBULATORY_CARE_PROVIDER_SITE_OTHER): Payer: Medicaid Other | Admitting: Internal Medicine

## 2020-10-25 DIAGNOSIS — Z79899 Other long term (current) drug therapy: Secondary | ICD-10-CM

## 2020-10-25 DIAGNOSIS — B351 Tinea unguium: Secondary | ICD-10-CM
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
## 2020-10-25 MED ORDER — TERBINAFINE HCL 250 MG PO TABS: 250.0000 mg | ORAL_TABLET | Freq: Every day | ORAL | 0 refills | Status: DC

## 2020-10-25 NOTE — Progress Notes (Signed)
Left ring finger with onychomycosis:  Redness and swelling at base of nail improved.  Last of 6 weeks will be this Sunday. She still does not have normal appearing nail at base yet.  Brownish discoloration.  Will extend Terbinafine treatment for another 28 days when completes her original 6 weeks of treatment
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
## 2020-10-26 ENCOUNTER — Encounter (HOSPITAL_COMMUNITY): Payer: Self-pay

## 2020-10-26 ENCOUNTER — Encounter (HOSPITAL_COMMUNITY)
Admission: RE | Admit: 2020-10-26 | Discharge: 2020-10-26 | Disposition: A | Discharge status: Home / Self Care | Payer: Medicaid Other | Source: Ambulatory Visit | Attending: Interventional Cardiology | Admitting: Interventional Cardiology

## 2020-10-26 VITALS — BP 98/72 | Ht 62.0 in | Wt 167.1 lb

## 2020-10-26 DIAGNOSIS — Z955 Presence of coronary angioplasty implant and graft: Secondary | ICD-10-CM | POA: Insufficient documentation

## 2020-10-26 LAB — GLUCOSE, CAPILLARY: Glucose-Capillary: 77 mg/dL (ref 70–99)

## 2020-10-26 LAB — HEPATIC FUNCTION PANEL
ALT: 12 IU/L (ref 0–32)
AST: 23 IU/L (ref 0–40)
Albumin: 4.5 g/dL (ref 3.8–4.9)
Alkaline Phosphatase: 155 IU/L — ABNORMAL HIGH (ref 44–121)
Bilirubin Total: 0.4 mg/dL (ref 0.0–1.2)
Bilirubin, Direct: 0.14 mg/dL (ref 0.00–0.40)
Total Protein: 7.8 g/dL (ref 6.0–8.5)

## 2020-10-26 NOTE — Progress Notes (Signed)
Cardiac Individual Treatment Plan  Patient Details  Name: Prima Rayner MRN: [id number] Date of Birth: 13-Jul-1966 Referring Provider:   Flowsheet Row CARDIAC REHAB PHASE II ORIENTATION from 10/26/2020 in [facility]  Referring Provider Larae Grooms, MD      Initial Encounter Date:  [place] PHASE II ORIENTATION from 10/26/2020 in [facility]  Date 10/26/20      Visit Diagnosis: 08/13/20 S/P DES LAD  Patient's Home Medications on Admission:  Current Outpatient Medications:  .  Accu-Chek FastClix Lancets MISC, CHECK BLOOD SUGAR 5 TIMES DAILY, Disp: , Rfl:  .  ACCU-CHEK GUIDE test strip, USE AS DIRECTED CHECK BLOOD SUGAR 5 TIMES A DAY, Disp: , Rfl:  .  acetaminophen (TYLENOL) 500 MG tablet, Take 500-1,000 mg by mouth every 6 (six) hours as needed for moderate pain. , Disp: , Rfl:  .  albuterol (PROVENTIL HFA;VENTOLIN HFA) 108 (90 Base) MCG/ACT inhaler, Inhale 2 puffs into the lungs every 6 (six) hours as needed for wheezing or shortness of breath., Disp: 1 Inhaler, Rfl: 0 .  ammonium lactate (AMLACTIN) 12 % cream, Apply topically as needed for dry skin., Disp: 385 g, Rfl: 0 .  Blood Glucose Monitoring Suppl (ACCU-CHEK GUIDE ME) w/Device KIT, USE TO MONITOR BLOOD SUGAR FIVE TIMES A DAY DX  E10.22 IN VITRO, Disp: , Rfl:  .  Cholecalciferol 25 MCG (1000 UT) capsule, Take 1 capsule (1,000 Units total) by mouth daily., Disp: 30 capsule, Rfl: 3 .  clopidogrel (PLAVIX) 75 MG tablet, Take 1 tablet (75 mg total) by mouth daily., Disp: 90 tablet, Rfl: 3 .  cyclobenzaprine (FLEXERIL) 10 MG tablet, Take 1-2 tablets by mouth at bedtime as needed for muscle spasms. Muscle spasms, Disp: , Rfl: 5 .  diphenhydrAMINE (BENADRYL) 25 MG tablet, Take 25 mg by mouth every 6 (six) hours as needed for itching or allergies., Disp: , Rfl:  .  famotidine (PEPCID) 20 MG tablet, Take 20 mg by mouth 2 (two) times daily., Disp: , Rfl:  .   folic acid (FOLVITE) 093 MCG tablet, Take 1,200 mcg by mouth daily., Disp: , Rfl:  .  furosemide (LASIX) 40 MG tablet, TAKE 1 TABLET BY MOUTH EVERY DAY IN THE MORNING, Disp: 30 tablet, Rfl: 11 .  gabapentin (NEURONTIN) 100 MG capsule, TAKE 1 CAPSULE BY MOUTH AT BEDTIME., Disp: 30 capsule, Rfl: 0 .  Incontinence Supply Disposable (POISE ULTRA THINS) PADS, Use 5 pads daily as needed for urinary stress incontinence, Disp: 150 each, Rfl: 11 .  insulin aspart (NOVOLOG) 100 UNIT/ML injection, Inject 4-16 Units into the skin 3 (three) times daily with meals. Per Sliding scale CBG 100-150: 4 units, 151-200: 6 units, 201-250: 8 units, 251-300: 10 units, 301-350: 12 units, 351-400: 14 units, > 401; call doctor, Disp: , Rfl:  .  LANTUS SOLOSTAR 100 UNIT/ML Solostar Pen, Inject 10 Units into the skin 2 (two) times daily. , Disp: , Rfl: 3 .  methotrexate (RHEUMATREX) 2.5 MG tablet, Take 10 mg by mouth every Wednesday., Disp: , Rfl:  .  metoprolol tartrate (LOPRESSOR) 25 MG tablet, TAKE 1 TABLET BY MOUTH TWICE A DAY, Disp: 60 tablet, Rfl: 11 .  nitroGLYCERIN (NITROSTAT) 0.4 MG SL tablet, Place 1 tablet (0.4 mg total) under the tongue every 5 (five) minutes as needed for chest pain. Max 3 doses, call 911., Disp: 25 tablet, Rfl: 7 .  ondansetron (ZOFRAN) 4 MG tablet, TAKE 1 TABLET (4 MG TOTAL) BY MOUTH EVERY  6 (SIX) HOURS., Disp: 12 tablet, Rfl: 0 .  pantoprazole (PROTONIX) 40 MG tablet, Take 40 mg by mouth daily., Disp: , Rfl:  .  prednisoLONE acetate (PRED FORTE) 1 % ophthalmic suspension, Place 4-5 drops into the left eye daily., Disp: , Rfl: 6 .  rosuvastatin (CRESTOR) 40 MG tablet, Take 1 tablet (40 mg total) by mouth daily., Disp: 90 tablet, Rfl: 3 .  sertraline (ZOLOFT) 100 MG tablet, Take 150 mg by mouth daily., Disp: , Rfl:  .  terbinafine (LAMISIL) 250 MG tablet, Take 1 tablet (250 mg total) by mouth daily., Disp: 42 tablet, Rfl: 0 .  terbinafine (LAMISIL) 250 MG tablet, Take 1 tablet (250 mg total) by  mouth daily., Disp: 28 tablet, Rfl: 0 .  traMADol (ULTRAM) 50 MG tablet, TAKE 1 TABLET EVERY 6 HOURS AS NEEDED FOR MODERATE PAIN, Disp: 90 tablet, Rfl: 2  Past Medical History: Past Medical History:  Diagnosis Date  . Acute myocardial infarction of other lateral wall, initial episode of care   . Acute myocardial infarction, unspecified site, initial episode of care   . Acute osteomyelitis   . Allergic rhinitis   . Anemia   . Anginal pain ([place])    07/15/13- no chest pain in months"  . Anxiety   . Bipolar affective ([place])   . CAD (coronary artery disease) 07/2011   s/p DES mid and distal RCA with 50% LAD  . CKD stage 1 due to type 2 diabetes mellitus ([place])   . Daily headache    not daily  . Depression    Bipolar disorder  . Diabetic foot ulcer associated with diabetes mellitus due to underlying condition ([place]) 09/26/2018  . Diabetic gastroparesis associated with type 2 diabetes mellitus ([place])   . Diabetic peripheral neuropathy associated with type 2 diabetes mellitus ([place])   . Diabetic retinopathy   . Esophageal stenosis   . Esophageal ulcer   . Esophagitis   . Gastroparesis   . Genital herpes    Reportedly tested and documented by [facility]--rare occurrences  . GERD (gastroesophageal reflux disease)   . Heart murmur   . History of stomach ulcers   . Hyperlipidemia   . Hypertension   . 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 Migraines   . Orthopnoea   . Pneumonia 2012  . Polysubstance abuse ([place])    Crack cocaine--none since 2008, MJ, ETOH:  clean of all since 2008  . Renal insufficiency   . Renal lithiasis    Bilateral  . Rheumatoid arthritis(714.0)   . Sebaceous cyst   . Stroke [facility]) 2011   denies residual on 12/19/2012.  "Years ago"  . Type II diabetes mellitus ([facility])    Previously uncontrolled for many years with multiple complications.  2017 controlled. 05/2016:  6.7%    Tobacco Use: Social History    Tobacco Use  Smoking Status Current Every Day Smoker  . Packs/day: 0.50  . Years: 22.00  . Pack years: 11.00  . Types: Cigarettes  Smokeless Tobacco Never Used  Tobacco Comment   Has been to classes--not sure if she wants to quit.  Changing to non menthol..  Chantix, patches, gum never helped.  9/19:  lot going on.    Labs: Recent Review Flowsheet Data    Labs for ITP Cardiac and Pulmonary Rehab Latest Ref Rng & Units 01/24/2019 01/30/2019 04/15/2019 09/08/2019 08/12/2020   Cholestrol 100 - 199 mg/dL 166 172 -  149 -   LDLCALC 0 - 99 mg/dL 69 66 - 66 -   HDL >39 mg/dL 82 89 - 70 -   Trlycerides 0 - 149 mg/dL 74 87 - 66 -   Hemoglobin A1c 4.8 - 5.6 % - - 7.0(H) 6.2(H) 6.7(H)   PHART 7.350 - 7.450 - - - - -   PCO2ART 35.0 - 45.0 mmHg - - - - -   HCO3 20.0 - 24.0 mEq/L - - - - -   TCO2 0 - 100 mmol/L - - - - -   ACIDBASEDEF 0.0 - 2.0 mmol/L - - - - -   O2SAT % - - - - -      Capillary Blood Glucose: Lab Results  Component Value Date   GLUCAP 77 10/26/2020   GLUCAP 151 (H) 08/14/2020   GLUCAP 253 (H) 08/14/2020   GLUCAP 194 (H) 08/13/2020   GLUCAP 258 (H) 08/13/2020     Exercise Target Goals: Exercise Program Goal: Individual exercise prescription set using results from initial 6 min walk test and THRR while considering  patient's activity barriers and safety.   Exercise Prescription Goal: Starting with aerobic activity 30 plus minutes a day, 3 days per week for initial exercise prescription. Provide home exercise prescription and guidelines that participant acknowledges understanding prior to discharge.  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 HR 91 bpm     Max Ex. BP 124/70     2 Minute Post BP 120/64            Oxygen Initial Assessment:   Oxygen Re-Evaluation:   Oxygen Discharge (Final Oxygen Re-Evaluation):   Initial Exercise Prescription:  Initial Exercise Prescription - 10/26/20 1300      Date of Initial Exercise RX and Referring Provider   Date 10/26/20    Referring Provider Larae Grooms, MD    Expected Discharge Date 12/24/20      Prescription Details   Frequency (times per week) 3    Duration Progress to 30 minutes of continuous aerobic without signs/symptoms of physical distress      Intensity   THRR 40-80% of Max Heartrate 67-134    Ratings of Perceived Exertion 11-13    Perceived Dyspnea 0-4      Progression   Progression Continue progressive overload as per policy without signs/symptoms or physical distress.      Resistance Training  Training Prescription Yes    Weight 2 lbs    Reps 10-15           Perform Capillary Blood Glucose checks as needed.  Exercise Prescription Changes:   Exercise Comments:   Exercise Goals and Review:  Exercise Goals    Row Name 10/26/20 1346             Exercise Goals   Increase Physical Activity Yes       Intervention Provide advice, education, support and counseling about physical activity/exercise needs.;Develop an individualized exercise prescription for aerobic and resistive training based on initial evaluation findings, risk stratification, comorbidities and participant's personal goals.       Expected Outcomes Short Term: Attend rehab on a regular basis to increase amount of physical  activity.;Long Term: Add in home exercise to make exercise part of routine and to increase amount of physical activity.;Long Term: Exercising regularly at least 3-5 days a week.       Increase Strength and Stamina Yes       Intervention Provide advice, education, support and counseling about physical activity/exercise needs.;Develop an individualized exercise prescription for aerobic and resistive training based on initial evaluation findings, risk stratification, comorbidities and participant's personal goals.       Expected Outcomes Short Term: Increase workloads from initial exercise prescription for resistance, speed, and METs.;Short Term: Perform resistance training exercises routinely during rehab and add in resistance training at home;Long Term: Improve cardiorespiratory fitness, muscular endurance and strength as measured by increased METs and functional capacity (6MWT)       Able to understand and use rate of perceived exertion (RPE) scale Yes       Intervention Provide education and explanation on how to use RPE scale       Expected Outcomes Short Term: Able to use RPE daily in rehab to express subjective intensity level;Long Term:  Able to use RPE to guide intensity level when exercising independently       Knowledge and understanding of Target Heart Rate Range (THRR) Yes       Intervention Provide education and explanation of THRR including how the numbers were predicted and where they are located for reference       Expected Outcomes Short Term: Able to use daily as guideline for intensity in rehab;Long Term: Able to use THRR to govern intensity when exercising independently;Short Term: Able to state/look up THRR       Understanding of Exercise Prescription Yes       Intervention Provide education, explanation, and written materials on patient's individual exercise prescription       Expected Outcomes Short Term: Able to explain program exercise prescription;Long Term: Able to explain home  exercise prescription to exercise independently              Exercise Goals Re-Evaluation :    Discharge Exercise Prescription (Final Exercise Prescription Changes):   Nutrition:  Target Goals: Understanding of nutrition guidelines, daily intake of sodium '1500mg'$ , cholesterol '200mg'$ , calories 30% from fat and 7% or less from saturated fats, daily to have 5 or more servings of fruits and vegetables.  Biometrics:  Pre Biometrics - 10/26/20 1327      Pre Biometrics   Waist Circumference 41 inches    Hip Circumference 45 inches    Waist to Hip Ratio 0.91 %    Triceps Skinfold 31 mm    % Body Fat 42.6 %    Grip Strength 25 kg    Flexibility 14.5  in    Single Leg Stand 4.75 seconds            Nutrition Therapy Plan and Nutrition Goals:   Nutrition Assessments:  MEDIFICTS Score Key:  ?70 Need to make dietary changes   40-70 Heart Healthy Diet  ? 40 Therapeutic Level Cholesterol Diet   Picture Your Plate Scores:  <53 Unhealthy dietary pattern with much room for improvement.  41-50 Dietary pattern unlikely to meet recommendations for good health and room for improvement.  51-60 More healthful dietary pattern, with some room for improvement.   >60 Healthy dietary pattern, although there may be some specific behaviors that could be improved.    Nutrition Goals Re-Evaluation:   Nutrition Goals Discharge (Final Nutrition Goals Re-Evaluation):   Psychosocial: Target Goals: Acknowledge presence or absence of significant depression and/or stress, maximize coping skills, provide positive support system. Participant is able to verbalize types and ability to use techniques and skills needed for reducing stress and depression.  Initial Review & Psychosocial Screening:  Initial Psych Review & Screening - 10/26/20 1440      Initial Review   Current issues with History of Depression      Family Dynamics   Good Support System? Yes   Brynnly has her children and friends  who [place] for support. Parminder is taking care of a 54 year old currently     Barriers   Psychosocial barriers to participate in program The patient should benefit from training in stress management and relaxation.      Screening Interventions   Interventions Encouraged to exercise    Expected Outcomes Long Term Goal: Stressors or current issues are controlled or eliminated.           Quality of Life Scores:  Quality of Life - 10/26/20 1331      Quality of Life   Select Quality of Life      Quality of Life Scores   Health/Function Pre 21.97 %    Socioeconomic Pre 24.64 %    Psych/Spiritual Pre 28.93 %    Family Pre 25.5 %    GLOBAL Pre 24.44 %          Scores of 19 and below usually indicate a poorer quality of life in these areas.  A difference of  2-3 points is a clinically meaningful difference.  A difference of 2-3 points in the total score of the Quality of Life Index has been associated with significant improvement in overall quality of life, self-image, physical symptoms, and general health in studies assessing change in quality of life.  PHQ-9: Recent Review Flowsheet Data    Depression screen [facility] 2/9 10/26/2020 11/21/2019   Decreased Interest 0 0   Down, Depressed, Hopeless 0 0   PHQ - 2 Score 0 0   Altered sleeping - 0   Tired, decreased energy - 0   Change in appetite - 0   Feeling bad or failure about yourself  - 0   Trouble concentrating - 0   Moving slowly or fidgety/restless - 0   Suicidal thoughts - 0   PHQ-9 Score - 0   Difficult doing work/chores - Not difficult at all     Interpretation of Total Score  Total Score Depression Severity:  1-4 = Minimal depression, 5-9 = Mild depression, 10-14 = Moderate depression, 15-19 = Moderately severe depression, 20-27 = Severe depression   Psychosocial Evaluation and Intervention:   Psychosocial Re-Evaluation:   Psychosocial Discharge (Final Psychosocial Re-Evaluation):  Vocational  Rehabilitation: Provide vocational rehab assistance to qualifying candidates.   Vocational Rehab Evaluation & Intervention:  Vocational Rehab - 10/26/20 1442      Initial Vocational Rehab Evaluation & Intervention   Assessment shows need for Vocational Rehabilitation No   Qu[place]alli is currently on disability and does not need vocational rehab at this time          Education: Education Goals: Education classes will be provided on a weekly basis, covering required topics. Participant will state understanding/return demonstration of topics presented.  Learning Barriers/Preferences:  Learning Barriers/Preferences - 10/26/20 1332      Learning Barriers/Preferences   Learning Barriers Sight   Blind in left eye   Learning Preferences Verbal Instruction;Computer/Internet;Audio           Education Topics: Hypertension, Hypertension Reduction -Define heart disease and [place] blood pressure. Discus how [place] blood pressure affects the body and ways to reduce [place] blood pressure.   Exercise and Your Heart -Discuss why it is important to exercise, the FITT principles of exercise, normal and abnormal responses to exercise, and how to exercise safely.   Angina -Discuss definition of angina, causes of angina, treatment of angina, and how to decrease risk of having angina.   Cardiac Medications -Review what the following cardiac medications are used for, how they affect the body, and side effects that may occur when taking the medications.  Medications include Aspirin, Beta blockers, calcium channel blockers, ACE Inhibitors, angiotensin receptor blockers, diuretics, digoxin, and antihyperlipidemics.   Congestive Heart Failure -Discuss the definition of CHF, how to live with CHF, the signs and symptoms of CHF, and how keep track of weight and sodium intake.   Heart Disease and Intimacy -Discus the effect sexual activity has on the heart, how changes occur during intimacy as we age, and safety  during sexual activity.   Smoking Cessation / COPD -Discuss different methods to quit smoking, the health benefits of quitting smoking, and the definition of COPD.   Nutrition I: Fats -Discuss the types of cholesterol, what cholesterol does to the heart, and how cholesterol levels can be controlled.   Nutrition II: Labels -Discuss the different components of food labels and how to read food label   Heart Parts/Heart Disease and PAD -Discuss the anatomy of the heart, the pathway of blood circulation through the heart, and these are affected by heart disease.   Stress I: Signs and Symptoms -Discuss the causes of stress, how stress may lead to anxiety and depression, and ways to limit stress.   Stress II: Relaxation -Discuss different types of relaxation techniques to limit stress.   Warning Signs of Stroke / TIA -Discuss definition of a stroke, what the signs and symptoms are of a stroke, and how to identify when someone is having stroke.   Knowledge Questionnaire Score:  Knowledge Questionnaire Score - 10/26/20 1333      Knowledge Questionnaire Score   Pre Score 24/28           Core Components/Risk Factors/Patient Goals at Admission:  Personal Goals and Risk Factors at Admission - 10/26/20 1333      Core Components/Risk Factors/Patient Goals on Admission    Weight Management Yes;Weight Loss    Intervention Weight Management: Develop a combined nutrition and exercise program designed to reach desired caloric intake, while maintaining appropriate intake of nutrient and fiber, sodium and fats, and appropriate energy expenditure required for the weight goal.;Weight Management: Provide education and appropriate resources to help participant work on and attain  dietary goals.;Weight Management/Obesity: Establish reasonable short term and long term weight goals.    Admit Weight 167 lb 1.7 oz (75.8 kg)    Expected Outcomes Understanding of distribution of calorie intake  throughout the day with the consumption of 4-5 meals/snacks;Weight Loss: Understanding of general recommendations for a balanced deficit meal plan, which promotes 1-2 lb weight loss per week and includes a negative energy balance of [phone] kcal/d;Understanding recommendations for meals to include 15-35% energy as protein, 25-35% energy from fat, 35-60% energy from carbohydrates, less than $RemoveB'200mg'XRPjMNzR$  of dietary cholesterol, 20-35 gm of total fiber daily;Weight Maintenance: Understanding of the daily nutrition guidelines, which includes 25-35% calories from fat, 7% or less cal from saturated fats, less than $RemoveB'200mg'UZjqUVMu$  cholesterol, less than 1.5gm of sodium, & 5 or more servings of fruits and vegetables daily;Long Term: Adherence to nutrition and physical activity/exercise program aimed toward attainment of established weight goal;Short Term: Continue to assess and modify interventions until short term weight is achieved    Tobacco Cessation Yes    Number of packs per day 2    Intervention Assist the participant in steps to quit. Provide individualized education and counseling about committing to Tobacco Cessation, relapse prevention, and pharmacological support that can be provided by physician.;Advice worker, assist with locating and accessing local/national Quit Smoking programs, and support quit date choice.    Expected Outcomes Short Term: Will demonstrate readiness to quit, by selecting a quit date.;Short Term: Will quit all tobacco product use, adhering to prevention of relapse plan.;Long Term: Complete abstinence from all tobacco products for at least 12 months from quit date.    Diabetes Yes    Intervention Provide education about signs/symptoms and action to take for hypo/hyperglycemia.;Provide education about proper nutrition, including hydration, and aerobic/resistive exercise prescription along with prescribed medications to achieve blood glucose in normal ranges: Fasting glucose 65-99 mg/dL     Expected Outcomes Short Term: Participant verbalizes understanding of the signs/symptoms and immediate care of hyper/hypoglycemia, proper foot care and importance of medication, aerobic/resistive exercise and nutrition plan for blood glucose control.;Long Term: Attainment of HbA1C < 7%.    Hypertension Yes    Intervention Provide education on lifestyle modifcations including regular physical activity/exercise, weight management, moderate sodium restriction and increased consumption of fresh fruit, vegetables, and low fat dairy, alcohol moderation, and smoking cessation.;Monitor prescription use compliance.    Expected Outcomes Short Term: Continued assessment and intervention until BP is < 140/46mm HG in hypertensive participants. < 130/67mm HG in hypertensive participants with diabetes, heart failure or chronic kidney disease.;Long Term: Maintenance of blood pressure at goal levels.    Lipids Yes    Intervention Provide education and support for participant on nutrition & aerobic/resistive exercise along with prescribed medications to achieve LDL '70mg'$ , HDL >$Remo'40mg'Dywyc$ .    Expected Outcomes Short Term: Participant states understanding of desired cholesterol values and is compliant with medications prescribed. Participant is following exercise prescription and nutrition guidelines.;Long Term: Cholesterol controlled with medications as prescribed, with individualized exercise RX and with personalized nutrition plan. Value goals: LDL < $Rem'70mg'AieZ$ , HDL > 40 mg.           Core Components/Risk Factors/Patient Goals Review:    Core Components/Risk Factors/Patient Goals at Discharge (Final Review):    ITP Comments:  ITP Comments    Row Name 10/26/20 1203           ITP Comments Dr Fransico Him MD, Medical Director  Comments:Indiyah  attended orientation on 10/26/2020 to review rules and guidelines for program.  Completed 6 minute walk test, Intitial ITP, and exercise prescription.  VSS.  Telemetry-Sinus Rhythm.  Asymptomatic. Safety measures and social distancing in place per CDC guidelines. Initial CBG 77. CBG in the 50's by [employer] CGM. Patient given Ginger ale repeat CBG went into the 120's. Will continue to monitor CBG's.Barnet Pall, RN,BSN 10/26/2020 2:50 PM

## 2020-10-28 ENCOUNTER — Telehealth (INDEPENDENT_AMBULATORY_CARE_PROVIDER_SITE_OTHER): Payer: Medicaid Other | Admitting: Clinical

## 2020-10-28 DIAGNOSIS — F609 Personality disorder, unspecified: Secondary | ICD-10-CM

## 2020-11-01 ENCOUNTER — Other Ambulatory Visit: Payer: Self-pay

## 2020-11-01 ENCOUNTER — Encounter (HOSPITAL_COMMUNITY)
Admission: RE | Admit: 2020-11-01 | Discharge: 2020-11-01 | Disposition: A | Discharge status: Home / Self Care | Payer: Medicaid Other | Source: Ambulatory Visit | Attending: Interventional Cardiology | Admitting: Interventional Cardiology

## 2020-11-01 DIAGNOSIS — Z955 Presence of coronary angioplasty implant and graft: Secondary | ICD-10-CM | POA: Diagnosis not present

## 2020-11-01 LAB — GLUCOSE, CAPILLARY: Glucose-Capillary: 140 mg/dL — ABNORMAL HIGH (ref 70–99)

## 2020-11-01 NOTE — Progress Notes (Signed)
Incomplete Session Note  Patient Details  Name: [name] [name] MRN: [id number] Date of Birth: 01/07/1966 Referring Provider:   Flowsheet Row CARDIAC REHAB PHASE II ORIENTATION from 10/26/2020 in [facility] CARDIAC REHAB  Referring Provider Lance Muss, MD      Maripaz Allocca did not complete her rehab session.  Dunya called to report that her CBG was 330 at lunch time and that she took 10 units of insulin. Nakota said that she ate egg and grits at 0930 and had a protein drink at 11:00. CBG was 140 via hospital meter. Britzy was advised not to exercise today. Patient was given peanut butter and graham cracker and water. Patient advised to eat a protein and carbohydrate prior to coming to exercise when she returns to exercise on Wednesday. Patient states understanding.Gladstone Lighter, RN,BSN 11/01/2020 1:26 PM

## 2020-11-03 ENCOUNTER — Other Ambulatory Visit: Payer: Self-pay

## 2020-11-03 ENCOUNTER — Encounter (HOSPITAL_COMMUNITY)
Admission: RE | Admit: 2020-11-03 | Discharge: 2020-11-03 | Disposition: A | Discharge status: Home / Self Care | Payer: Medicaid Other | Source: Ambulatory Visit | Attending: Interventional Cardiology | Admitting: Interventional Cardiology

## 2020-11-03 DIAGNOSIS — Z955 Presence of coronary angioplasty implant and graft: Secondary | ICD-10-CM | POA: Diagnosis not present

## 2020-11-03 LAB — GLUCOSE, CAPILLARY
Glucose-Capillary: 166 mg/dL — ABNORMAL HIGH (ref 70–99)
Glucose-Capillary: 119 mg/dL — ABNORMAL HIGH (ref 70–99)
Glucose-Capillary: 88 mg/dL (ref 70–99)

## 2020-11-03 NOTE — Progress Notes (Addendum)
Daily Session Note  Patient Details  Name: [name] [name] MRN: [id number] Date of Birth: [birth year] Referring Provider:   Flowsheet Row CARDIAC REHAB PHASE II ORIENTATION from 10/26/2020 in [facility] CARDIAC [facility]  Referring Provider Lance Muss, MD      Encounter Date: 11/03/2020  Check In:  Session Check In - 11/03/20 1312      Check-In   Supervising physician immediately available to respond to emergencies [facility] immediately available    Physician(s) Dr. Renford Dills    Location [facility]    Staff Present Harriett Sine, RN, MHA;David Makemson, MS, EP-C, CCRP;Olinty Peggye Pitt, MS, ACSM CEP, Exercise Physiologist;Dwain Huhn, RN, BSN;Other   Hughie Closs, EP   Virtual Visit No    Medication changes reported     No    Fall or balance concerns reported    No    Tobacco Cessation No Change    Current number of cigarettes/nicotine per day     6    Warm-up and Cool-down Performed on first and last piece of equipment    Resistance Training Performed No    VAD Patient? No    PAD/SET Patient? No      Pain Assessment   Currently in Pain? No/denies    Pain Score 0-No pain    Multiple Pain Sites No           Capillary Blood Glucose: Results for orders placed or performed during the hospital encounter of 11/03/20 (from the past 24 hour(s))  Glucose, capillary     Status: Abnormal   Collection Time: 11/03/20  1:14 PM  Result Value Ref Range   Glucose-Capillary 166 (H) 70 - 99 mg/dL  Glucose, capillary     Status: Abnormal   Collection Time: 11/03/20  1:43 PM  Result Value Ref Range   Glucose-Capillary 119 (H) 70 - 99 mg/dL  Glucose, capillary     Status: None   Collection Time: 11/03/20  2:11 PM  Result Value Ref Range   Glucose-Capillary 88 70 - 99 mg/dL     Exercise Prescription Changes - 11/03/20 1600      Response to Exercise   Blood Pressure (Admit) 106/54    Blood Pressure (Exercise) 104/70    Blood Pressure  (Exit) 110/58    Heart Rate (Admit) 86 bpm    Heart Rate (Exercise) 92 bpm    Heart Rate (Exit) 83 bpm    Rating of Perceived Exertion (Exercise) 11    Symptoms None    Comments Pt's first day of exercise in the CRP2 program    Duration Continue with 30 min of aerobic exercise without signs/symptoms of physical distress.    Intensity THRR unchanged      Progression   Progression Continue to progress workloads to maintain intensity without signs/symptoms of physical distress.    Average METs 2.4      Resistance Training   Training Prescription No    Weight No weights on Wednesdays      Interval Training   Interval Training No      NuStep   Level 2    SPM 75    Minutes 30    METs 2.4           Social History   Tobacco Use  Smoking Status Current Every Day Smoker  . Packs/day: 0.50  . Years: 22.00  . Pack years: 11.00  . Types: Cigarettes  Smokeless Tobacco Never Used  Tobacco Comment   Has been  to classes--not sure if she wants to quit.  Changing to non menthol..  Chantix, patches, gum never helped.  9/19:  lot going on.    Goals Met:  Exercise tolerated well No report of cardiac concerns or symptoms  Goals Unmet:  CBG's  Comments: Toluwanimi started cardiac rehab today.  Pt tolerated light exercise without difficulty. VSS, telemetry-Sinus Rhythm, asymptomatic.  Medication list reconciled. Pt denies barriers to medicaiton compliance.  PSYCHOSOCIAL ASSESSMENT:  PHQ-0. Pt exhibits positive coping skills, hopeful outlook with supportive family. No psychosocial needs identified at this time, no psychosocial interventions necessary.    Pt enjoys her pets, cooking and trvelling.   Pt oriented to exercise equipment and routine.    Understanding verbalized. CBG checked 1/2 way through exercise noted a 119. Patient ate 2 hard candies. Recheck CBG 88. Patient ate peanut butter, graham cracker and a hard candy and water. Clairissa was counseled by the dietitian. Recheck CBG 88. Danyal did not  do stretches and was advised to go home and eat a protein and a carbohydrate. Tamber said that she will cut back on his sliding scale insulin on her next exercise session.Gladstone Lighter, RN,BSN 11/03/2020 4:30 PM   Dr. Armanda Magic is Medical Director for Cardiac Rehab at [facility].

## 2020-11-04 NOTE — Progress Notes (Signed)
   THERAPY PROGRESS NOTE Via google duo  Session Time: 10-11am Participation Level: Active Behavioral Response: CasualAlertNA Type of Therapy: Individual Therapy Treatment Goals addressed: Anger and Coping   Purpose: LCSW met with client for routine individual therapy to work towards treatment goals: Learning coping skills to manage anger  Intervention: LCSW met with patient for routine individual therapy to continue to work towards treatment goals. LCSW provided patient opportunity to check in to assess for any significant events and how she is doing today. LCSW utilized intervention of Strength Based as patient identified changes and boundaries she has placed to work towards stabilizing good. LCSW praised patient as she has identified changes within herself from placing these boundaries and recognization of self awareness. LCSW assessed for SI/HI/command psychosis.  Effectiveness: Patient is alert x4 affect. Patient reports today she is well has been resting. Patient provided brief update regarding teenager she is caring for. Patient reports she placed her boundary with the father as of April 1st if no changes she will have to do the alternative choice. Patient did however recognize self awareness of the child's behavior could be related to the Autism so she shared working on having patience with her. Patient recognized by placing these boundaries and making changes awareness "if I can't take care of me I won't be able to care of anyone else."   Patient shared she has officially blocked phone number of T and no longer accepting anything related to him. Patient reports working on not having any negative individuals in her life. Patient reports it feels good to maintain this accountability to take care of her health and herself. Patient reports will be going to her heart rehab for two months and is looking forward to this routine. Patient denied active SI/HI.    [name], LCSW 11/04/2020

## 2020-11-05 ENCOUNTER — Other Ambulatory Visit: Payer: Self-pay

## 2020-11-05 ENCOUNTER — Encounter (HOSPITAL_COMMUNITY)
Admission: RE | Admit: 2020-11-05 | Discharge: 2020-11-05 | Disposition: A | Discharge status: Home / Self Care | Payer: Medicaid Other | Source: Ambulatory Visit | Attending: Interventional Cardiology | Admitting: Interventional Cardiology

## 2020-11-05 DIAGNOSIS — Z955 Presence of coronary angioplasty implant and graft: Secondary | ICD-10-CM

## 2020-11-05 LAB — GLUCOSE, CAPILLARY: Glucose-Capillary: 261 mg/dL — ABNORMAL HIGH (ref 70–99)

## 2020-11-08 ENCOUNTER — Encounter (HOSPITAL_COMMUNITY)
Admission: RE | Admit: 2020-11-08 | Discharge: 2020-11-08 | Disposition: A | Discharge status: Home / Self Care | Payer: Medicaid Other | Source: Ambulatory Visit | Attending: Interventional Cardiology | Admitting: Interventional Cardiology

## 2020-11-08 ENCOUNTER — Other Ambulatory Visit: Payer: Self-pay

## 2020-11-08 DIAGNOSIS — Z955 Presence of coronary angioplasty implant and graft: Secondary | ICD-10-CM | POA: Diagnosis not present

## 2020-11-10 ENCOUNTER — Other Ambulatory Visit: Payer: Self-pay

## 2020-11-10 ENCOUNTER — Encounter (HOSPITAL_COMMUNITY)
Admission: RE | Admit: 2020-11-10 | Discharge: 2020-11-10 | Disposition: A | Discharge status: Home / Self Care | Payer: Medicaid Other | Source: Ambulatory Visit | Attending: Interventional Cardiology | Admitting: Interventional Cardiology

## 2020-11-10 ENCOUNTER — Telehealth (INDEPENDENT_AMBULATORY_CARE_PROVIDER_SITE_OTHER): Payer: Medicaid Other | Admitting: Clinical

## 2020-11-10 DIAGNOSIS — Z955 Presence of coronary angioplasty implant and graft: Secondary | ICD-10-CM | POA: Diagnosis not present

## 2020-11-10 DIAGNOSIS — F609 Personality disorder, unspecified: Secondary | ICD-10-CM

## 2020-11-12 ENCOUNTER — Encounter (HOSPITAL_COMMUNITY)
Admission: RE | Admit: 2020-11-12 | Discharge: 2020-11-12 | Disposition: A | Discharge status: Home / Self Care | Payer: Medicaid Other | Source: Ambulatory Visit | Attending: Interventional Cardiology | Admitting: Interventional Cardiology

## 2020-11-12 ENCOUNTER — Other Ambulatory Visit: Payer: Self-pay

## 2020-11-12 DIAGNOSIS — Z955 Presence of coronary angioplasty implant and graft: Secondary | ICD-10-CM | POA: Diagnosis not present

## 2020-11-12 LAB — GLUCOSE, CAPILLARY: Glucose-Capillary: 111 mg/dL — ABNORMAL HIGH (ref 70–99)

## 2020-11-12 NOTE — Progress Notes (Signed)
[name] [name] 55 y.o. female Nutrition Note  Diagnosis: DES LAD   Past Medical History:  Diagnosis Date  . Acute myocardial infarction of other lateral wall, initial episode of care   . Acute myocardial infarction, unspecified site, initial episode of care   . Acute osteomyelitis   . Allergic rhinitis   . Anemia   . Anginal pain (HCC)    07/15/13- no chest pain in months"  . Anxiety   . Bipolar affective (HCC)   . CAD (coronary artery disease) 07/2011   s/p DES mid and distal RCA with 50% LAD  . CKD stage 1 due to type 2 diabetes mellitus (HCC)   . Daily headache    not daily  . Depression    Bipolar disorder  . Diabetic foot ulcer associated with diabetes mellitus due to underlying condition (HCC) 09/26/2018  . Diabetic gastroparesis associated with type 2 diabetes mellitus (HCC)   . Diabetic peripheral neuropathy associated with type 2 diabetes mellitus (HCC)   . Diabetic retinopathy   . Esophageal stenosis   . Esophageal ulcer   . Esophagitis   . Gastroparesis   . Genital herpes    Reportedly tested and documented by [facility]--rare occurrences  . GERD (gastroesophageal reflux disease)   . Heart murmur   . History of stomach ulcers   . Hyperlipidemia   . Hypertension   . 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 Migraines   . Orthopnoea   . Pneumonia 2012  . Polysubstance abuse (HCC)    Crack cocaine--none since 2008, MJ, ETOH:  clean of all since 2008  . Renal insufficiency   . Renal lithiasis    Bilateral  . Rheumatoid arthritis(714.0)   . Sebaceous cyst   . Stroke (HCC) 2011   denies residual on 12/19/2012.  "Years ago"  . Type II diabetes mellitus (HCC)    Previously uncontrolled for many years with multiple complications.  2017 controlled. 05/2016:  6.7%     Medications reviewed.   Current Outpatient Medications:  .  Accu-Chek FastClix Lancets MISC, CHECK BLOOD SUGAR 5 TIMES DAILY, Disp: , Rfl:  .   ACCU-CHEK GUIDE test strip, USE AS DIRECTED CHECK BLOOD SUGAR 5 TIMES A DAY, Disp: , Rfl:  .  acetaminophen (TYLENOL) 500 MG tablet, Take 500-1,000 mg by mouth every 6 (six) hours as needed for moderate pain. , Disp: , Rfl:  .  albuterol (PROVENTIL HFA;VENTOLIN HFA) 108 (90 Base) MCG/ACT inhaler, Inhale 2 puffs into the lungs every 6 (six) hours as needed for wheezing or shortness of breath., Disp: 1 Inhaler, Rfl: 0 .  ammonium lactate (AMLACTIN) 12 % cream, Apply topically as needed for dry skin., Disp: 385 g, Rfl: 0 .  Blood Glucose Monitoring Suppl (ACCU-CHEK GUIDE ME) w/Device KIT, USE TO MONITOR BLOOD SUGAR FIVE TIMES A DAY DX  E10.22 IN VITRO, Disp: , Rfl:  .  Cholecalciferol 25 MCG (1000 UT) capsule, Take 1 capsule (1,000 Units total) by mouth daily., Disp: 30 capsule, Rfl: 3 .  clopidogrel (PLAVIX) 75 MG tablet, Take 1 tablet (75 mg total) by mouth daily., Disp: 90 tablet, Rfl: 3 .  cyclobenzaprine (FLEXERIL) 10 MG tablet, Take 1-2 tablets by mouth at bedtime as needed for muscle spasms. Muscle spasms, Disp: , Rfl: 5 .  diphenhydrAMINE (BENADRYL) 25 MG tablet, Take 25 mg by mouth every 6 (six) hours as needed for itching or allergies., Disp: , Rfl:  .    famotidine (PEPCID) 20 MG tablet, Take 20 mg by mouth 2 (two) times daily., Disp: , Rfl:  .  folic acid (FOLVITE) 301 MCG tablet, Take 1,200 mcg by mouth daily., Disp: , Rfl:  .  furosemide (LASIX) 40 MG tablet, TAKE 1 TABLET BY MOUTH EVERY DAY IN THE MORNING, Disp: 30 tablet, Rfl: 11 .  gabapentin (NEURONTIN) 100 MG capsule, TAKE 1 CAPSULE BY MOUTH AT BEDTIME., Disp: 30 capsule, Rfl: 0 .  Incontinence Supply Disposable (POISE ULTRA THINS) PADS, Use 5 pads daily as needed for urinary stress incontinence, Disp: 150 each, Rfl: 11 .  insulin aspart (NOVOLOG) 100 UNIT/ML injection, Inject 4-16 Units into the skin 3 (three) times daily with meals. Per Sliding scale CBG 100-150: 4 units, 151-200: 6 units, 201-250: 8 units, 251-300: 10 units, 301-350:  12 units, 351-400: 14 units, > 401; call doctor, Disp: , Rfl:  .  LANTUS SOLOSTAR 100 UNIT/ML Solostar Pen, Inject 10 Units into the skin 2 (two) times daily. , Disp: , Rfl: 3 .  methotrexate (RHEUMATREX) 2.5 MG tablet, Take 10 mg by mouth every Wednesday., Disp: , Rfl:  .  metoprolol tartrate (LOPRESSOR) 25 MG tablet, TAKE 1 TABLET BY MOUTH TWICE A DAY, Disp: 60 tablet, Rfl: 11 .  nitroGLYCERIN (NITROSTAT) 0.4 MG SL tablet, Place 1 tablet (0.4 mg total) under the tongue every 5 (five) minutes as needed for chest pain. Max 3 doses, call 911., Disp: 25 tablet, Rfl: 7 .  ondansetron (ZOFRAN) 4 MG tablet, TAKE 1 TABLET (4 MG TOTAL) BY MOUTH EVERY 6 (SIX) HOURS., Disp: 12 tablet, Rfl: 0 .  pantoprazole (PROTONIX) 40 MG tablet, Take 40 mg by mouth daily., Disp: , Rfl:  .  prednisoLONE acetate (PRED FORTE) 1 % ophthalmic suspension, Place 4-5 drops into the left eye daily., Disp: , Rfl: 6 .  rosuvastatin (CRESTOR) 40 MG tablet, Take 1 tablet (40 mg total) by mouth daily., Disp: 90 tablet, Rfl: 3 .  sertraline (ZOLOFT) 100 MG tablet, Take 150 mg by mouth daily., Disp: , Rfl:  .  terbinafine (LAMISIL) 250 MG tablet, Take 1 tablet (250 mg total) by mouth daily., Disp: 42 tablet, Rfl: 0 .  terbinafine (LAMISIL) 250 MG tablet, Take 1 tablet (250 mg total) by mouth daily., Disp: 28 tablet, Rfl: 0 .  traMADol (ULTRAM) 50 MG tablet, TAKE 1 TABLET EVERY 6 HOURS AS NEEDED FOR MODERATE PAIN, Disp: 90 tablet, Rfl: 2   Ht Readings from Last 1 Encounters:  10/26/20 5' 2" (1.575 m)     Wt Readings from Last 3 Encounters:  10/26/20 167 lb 1.7 oz (75.8 kg)  10/04/20 164 lb 9.6 oz (74.7 kg)  10/01/20 160 lb (72.6 kg)     There is no height or weight on file to calculate BMI.   Social History   Tobacco Use  Smoking Status Current Every Day Smoker  . Packs/day: 0.50  . Years: 22.00  . Pack years: 11.00  . Types: Cigarettes  Smokeless Tobacco Never Used  Tobacco Comment   Has been to classes--not sure if  she wants to quit.  Changing to non menthol..  Chantix, patches, gum never helped.  9/19:  lot going on.     Lab Results  Component Value Date   CHOL 149 09/08/2019   Lab Results  Component Value Date   HDL 70 09/08/2019   Lab Results  Component Value Date   LDLCALC 66 09/08/2019   Lab Results  Component Value Date   TRIG 66  09/08/2019     Lab Results  Component Value Date   HGBA1C 6.7 (H) 08/12/2020     CBG (last 3)  No results for input(s): GLUCAP in the last 72 hours.   Nutrition Note  Spoke with pt. Nutrition Plan and Nutrition Survey goals reviewed with pt.   Pt has Type 1 Diabetes. Last A1c indicates blood glucose well-controlled. Pt with freestyle libre CGM.  She has been on insulin pump therapy years ago but did not like it. Currently MDI therapy with sliding scale. She has had multiple hypoglycemia episodes in the past few weeks. She is following sliding scale therapy but sometimes she takes insulin following sliding scale but does not eat. She reports eating without taking insulin at times as well.  This morning she forgot her insulin pen when she left the house. She ate a banana. Post prandial CGM reading was >300 mg/dl. She took 10 units novolog and ate 1 piece fried chx and 1/2 cup rice.  CBG 323 mg/dl upon arrival. During counseling with RD, CGM alarmed with blood sugar 53 mg/dl. We did a fingerstick with hospital meter to confirm and treated with 4 oz gingerale and 1/2 orange.  Followed hypoglycemia protocol until pt CBG 111 mg/dl. She ate ~110 g carbs to elevate CBG >80 mg/dl. RN contacted endocrinologist.   She reports taking Novolog and waiting 5 minutes and if her CGM does not show a decrease in blood sugar, she takes more novolog. Reviewed insulin action time with pt.  Pt does not carb count. She has in the past but does not like it. She does try to use plate method and verbalized what that looks like.   Pt having difficulty with diet because she is  trying to stop smoking. She snacks when she tries to avoid smoking. She has started vaping. She is smoking 1/2 ppd.    Pt expressed understanding of the information reviewed.    Nutrition Diagnosis ? Food-and nutrition-related knowledge deficit related to lack of exposure to information as related to diagnosis of: ? CVD ? Type 2 Diabetes  Nutrition Intervention ? Pt's individual nutrition plan reviewed with pt. ? Benefits of adopting Heart Healthy diet discussed when Picture Your Plate reviewed. ? Continue client-centered nutrition education by RD, as part of interdisciplinary care.  Goal(s)  ? Pt to build a healthy plate including vegetables, fruits, whole grains, and low-fat dairy products in a heart healthy meal plan. ? Pt to reduce time spent in hypoglycemia <1% per CGM ? CBG concentrations in the normal range or as close to normal as is safely possible.  Plan:   Will provide client-centered nutrition education as part of interdisciplinary care  Monitor and evaluate progress toward nutrition goal with team.   Michaele Offer, MS, RDN, LDN

## 2020-11-12 NOTE — Progress Notes (Signed)
Incomplete Session Note  Patient Details  Name: [name] [name] MRN: [id number] Date of Birth: Jun 24, 1966 Referring Provider:   Flowsheet Row CARDIAC REHAB PHASE II ORIENTATION from 10/26/2020 in [facility] CARDIAC REHAB  Referring Provider Lance Muss, MD       Rusty Gutman did not complete her rehab session.  CBG 323 by her CGM. Advised not to exercise due to elevated CBG. Pt actually went into the mens restroom. Recheck CBG 317 then 141. Patient says that she took 10 units of her sliding scale insulin with lunch at 12:30. The patient ate friedd chicken and rice. Patient was being counseled by Andrey Campanile [facility]. Recheck CBG 53 by her CGM. Patient was trying to eat an orange and ginger ale and said that  Was unable to eat/ drink anything else. Patient was given glucose gel per hypoglycemic protocol. BP 118/63 heart rate 72. Oxygen saturation 100% on room air. Rechecked CGM 53. Checked CBG with hospital meter 73. Second glucose gel given to the patient. Attempted to call Dr Daune Perch office to notify. Dr Daune Perch office is closed for the weekend. Judy Pimple PAC called and notified. Repeat CBG 66. Patient remains asymptomatic. CBG recheck 61 Patient was given graham crackers. I initially was going to take the patient to the ED for further evaluation. But decided to wait and monitor in cardiac rehab as the patient remained asymptomatic and continued to try to eat and drink. Repeat CBG 86. Judy Pimple PAC called and notified. Exit CBG 111 asked that [name] follow up with Dr Sharl Ma prior to returning to exercise at cardiac rehab. Patient states understanding. Will fax today's events to Dr Daune Perch office for review.Gladstone Lighter, RN,BSN 11/12/2020 4:12 PM

## 2020-11-15 ENCOUNTER — Telehealth (HOSPITAL_COMMUNITY): Payer: Self-pay | Admitting: *Deleted

## 2020-11-15 ENCOUNTER — Encounter (HOSPITAL_COMMUNITY): Payer: Medicaid Other

## 2020-11-15 LAB — GLUCOSE, CAPILLARY
Glucose-Capillary: 141 mg/dL — ABNORMAL HIGH (ref 70–99)
Glucose-Capillary: 70 mg/dL (ref 70–99)
Glucose-Capillary: 73 mg/dL (ref 70–99)
Glucose-Capillary: 61 mg/dL — ABNORMAL LOW (ref 70–99)
Glucose-Capillary: 66 mg/dL — ABNORMAL LOW (ref 70–99)
Glucose-Capillary: 86 mg/dL (ref 70–99)

## 2020-11-15 NOTE — Telephone Encounter (Signed)
Talked with the Aaria. Chastity said that she saw Dr Sharl Ma today. Dr Sharl Ma adjusted his sliding scale insulin. Tyianna  Says that she has a note from Dr Sharl Ma to return to exercise on Wednesday.Gladstone Lighter, RN,BSN 11/15/2020 1:17 PM

## 2020-11-17 ENCOUNTER — Encounter (HOSPITAL_COMMUNITY)
Admission: RE | Admit: 2020-11-17 | Discharge: 2020-11-17 | Disposition: A | Discharge status: Home / Self Care | Payer: Medicaid Other | Source: Ambulatory Visit | Attending: Interventional Cardiology | Admitting: Interventional Cardiology

## 2020-11-17 ENCOUNTER — Other Ambulatory Visit: Payer: Self-pay

## 2020-11-17 DIAGNOSIS — Z955 Presence of coronary angioplasty implant and graft: Secondary | ICD-10-CM | POA: Diagnosis not present

## 2020-11-17 MED FILL — Glucose Gel 40%: ORAL | Qty: 1 | Status: AC

## 2020-11-17 NOTE — Progress Notes (Signed)
[name] [name] returned to exercise today. Received the okay from Dr Sharl Ma for the patient to return to exercise. Dr Sharl Ma decreased [name]'s Lantus to 9 units twice a day.Gladstone Lighter, RN,BSN 11/17/2020 2:10 PM

## 2020-11-18 ENCOUNTER — Ambulatory Visit: Payer: Medicaid Other | Admitting: Internal Medicine

## 2020-11-19 ENCOUNTER — Other Ambulatory Visit: Payer: Self-pay

## 2020-11-19 ENCOUNTER — Encounter (HOSPITAL_COMMUNITY)
Admission: RE | Admit: 2020-11-19 | Discharge: 2020-11-19 | Disposition: A | Discharge status: Home / Self Care | Payer: Medicaid Other | Source: Ambulatory Visit | Attending: Interventional Cardiology | Admitting: Interventional Cardiology

## 2020-11-19 DIAGNOSIS — Z955 Presence of coronary angioplasty implant and graft: Secondary | ICD-10-CM | POA: Diagnosis not present
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
## 2020-11-22 ENCOUNTER — Other Ambulatory Visit: Payer: Medicaid Other | Admitting: Internal Medicine

## 2020-11-22 ENCOUNTER — Telehealth (HOSPITAL_COMMUNITY): Payer: Self-pay | Admitting: Internal Medicine

## 2020-11-22 ENCOUNTER — Encounter (HOSPITAL_COMMUNITY): Payer: Medicaid Other

## 2020-11-22 ENCOUNTER — Other Ambulatory Visit: Payer: Self-pay

## 2020-11-22 DIAGNOSIS — Z23 Encounter for immunization: Secondary | ICD-10-CM | POA: Diagnosis not present

## 2020-11-22 DIAGNOSIS — B351 Tinea unguium: Secondary | ICD-10-CM | POA: Diagnosis not present

## 2020-11-22 DIAGNOSIS — Z79899 Other long term (current) drug therapy: Secondary | ICD-10-CM | POA: Diagnosis not present

## 2020-11-22 NOTE — Progress Notes (Signed)
She missed her appt last week Fingernail no better with 3 days left of 12 week course of Terbinafine. Unable to obtain blood sample today with feet or hands. Stopping Terbinafine regardless Her nail is with significant brown/orange discoloration and thickening.   Unable to clip a piece at this time to send for fungal culture.  She will return in 1 month for fingernail clipping and fungal culture of clippings to better clarify what antifungal she should use.  MOderna 2nd booster given. She is to check when she is to have her mammogram and if within 6 weeks, postpone.  Unable to see an appt in her chart.

## 2020-11-23 ENCOUNTER — Telehealth: Payer: Self-pay | Admitting: Internal Medicine

## 2020-11-23 NOTE — Telephone Encounter (Signed)
Patient stated that she is having chills and severe body ache after the COVID booster.  Dr, Delrae Alfred recommended for the patient to take 2 500mg  tables of Tylenol as needed, but no more than 3 times in a 24 hour period.   Patient verbally expressed understanding of recommendations. Pt. Is aware that she can call back if symptoms persist or if worsen.

## 2020-11-23 NOTE — Progress Notes (Signed)
Cardiac Individual Treatment Plan  Patient Details  Name: [name] [name] MRN: [id number] Date of Birth: 13-Jul-1966 Referring Provider:   Flowsheet Row CARDIAC REHAB PHASE II ORIENTATION from 10/26/2020 in [facility]  Referring Provider Larae Grooms, MD      Initial Encounter Date:  [place] PHASE II ORIENTATION from 10/26/2020 in [facility]  Date 10/26/20      Visit Diagnosis: 08/13/20 S/P DES LAD  Patient's Home Medications on Admission:  Current Outpatient Medications:  .  Accu-Chek FastClix Lancets MISC, CHECK BLOOD SUGAR 5 TIMES DAILY, Disp: , Rfl:  .  ACCU-CHEK GUIDE test strip, USE AS DIRECTED CHECK BLOOD SUGAR 5 TIMES A DAY, Disp: , Rfl:  .  acetaminophen (TYLENOL) 500 MG tablet, Take 500-1,000 mg by mouth every 6 (six) hours as needed for moderate pain. , Disp: , Rfl:  .  albuterol (PROVENTIL HFA;VENTOLIN HFA) 108 (90 Base) MCG/ACT inhaler, Inhale 2 puffs into the lungs every 6 (six) hours as needed for wheezing or shortness of breath., Disp: 1 Inhaler, Rfl: 0 .  ammonium lactate (AMLACTIN) 12 % cream, Apply topically as needed for dry skin., Disp: 385 g, Rfl: 0 .  Blood Glucose Monitoring Suppl (ACCU-CHEK GUIDE ME) w/Device KIT, USE TO MONITOR BLOOD SUGAR FIVE TIMES A DAY DX  E10.22 IN VITRO, Disp: , Rfl:  .  Cholecalciferol 25 MCG (1000 UT) capsule, Take 1 capsule (1,000 Units total) by mouth daily., Disp: 30 capsule, Rfl: 3 .  clopidogrel (PLAVIX) 75 MG tablet, Take 1 tablet (75 mg total) by mouth daily., Disp: 90 tablet, Rfl: 3 .  cyclobenzaprine (FLEXERIL) 10 MG tablet, Take 1-2 tablets by mouth at bedtime as needed for muscle spasms. Muscle spasms, Disp: , Rfl: 5 .  diphenhydrAMINE (BENADRYL) 25 MG tablet, Take 25 mg by mouth every 6 (six) hours as needed for itching or allergies., Disp: , Rfl:  .  famotidine (PEPCID) 20 MG tablet, Take 20 mg by mouth 2 (two) times daily., Disp: , Rfl:  .   folic acid (FOLVITE) 093 MCG tablet, Take 1,200 mcg by mouth daily., Disp: , Rfl:  .  furosemide (LASIX) 40 MG tablet, TAKE 1 TABLET BY MOUTH EVERY DAY IN THE MORNING, Disp: 30 tablet, Rfl: 11 .  gabapentin (NEURONTIN) 100 MG capsule, TAKE 1 CAPSULE BY MOUTH AT BEDTIME., Disp: 30 capsule, Rfl: 0 .  Incontinence Supply Disposable (POISE ULTRA THINS) PADS, Use 5 pads daily as needed for urinary stress incontinence, Disp: 150 each, Rfl: 11 .  insulin aspart (NOVOLOG) 100 UNIT/ML injection, Inject 4-16 Units into the skin 3 (three) times daily with meals. Per Sliding scale CBG 100-150: 4 units, 151-200: 6 units, 201-250: 8 units, 251-300: 10 units, 301-350: 12 units, 351-400: 14 units, > 401; call doctor, Disp: , Rfl:  .  LANTUS SOLOSTAR 100 UNIT/ML Solostar Pen, Inject 10 Units into the skin 2 (two) times daily. , Disp: , Rfl: 3 .  methotrexate (RHEUMATREX) 2.5 MG tablet, Take 10 mg by mouth every Wednesday., Disp: , Rfl:  .  metoprolol tartrate (LOPRESSOR) 25 MG tablet, TAKE 1 TABLET BY MOUTH TWICE A DAY, Disp: 60 tablet, Rfl: 11 .  nitroGLYCERIN (NITROSTAT) 0.4 MG SL tablet, Place 1 tablet (0.4 mg total) under the tongue every 5 (five) minutes as needed for chest pain. Max 3 doses, call 911., Disp: 25 tablet, Rfl: 7 .  ondansetron (ZOFRAN) 4 MG tablet, TAKE 1 TABLET (4 MG TOTAL) BY MOUTH EVERY  6 (SIX) HOURS., Disp: 12 tablet, Rfl: 0 .  pantoprazole (PROTONIX) 40 MG tablet, Take 40 mg by mouth daily., Disp: , Rfl:  .  prednisoLONE acetate (PRED FORTE) 1 % ophthalmic suspension, Place 4-5 drops into the left eye daily., Disp: , Rfl: 6 .  rosuvastatin (CRESTOR) 40 MG tablet, Take 1 tablet (40 mg total) by mouth daily., Disp: 90 tablet, Rfl: 3 .  sertraline (ZOLOFT) 100 MG tablet, Take 150 mg by mouth daily., Disp: , Rfl:  .  terbinafine (LAMISIL) 250 MG tablet, Take 1 tablet (250 mg total) by mouth daily., Disp: 42 tablet, Rfl: 0 .  terbinafine (LAMISIL) 250 MG tablet, Take 1 tablet (250 mg total) by  mouth daily., Disp: 28 tablet, Rfl: 0 .  traMADol (ULTRAM) 50 MG tablet, TAKE 1 TABLET EVERY 6 HOURS AS NEEDED FOR MODERATE PAIN, Disp: 90 tablet, Rfl: 2  Past Medical History: Past Medical History:  Diagnosis Date  . Acute myocardial infarction of other lateral wall, initial episode of care   . Acute myocardial infarction, unspecified site, initial episode of care   . Acute osteomyelitis   . Allergic rhinitis   . Anemia   . Anginal pain ([place])    07/15/13- no chest pain in months"  . Anxiety   . Bipolar affective ([place])   . CAD (coronary artery disease) 07/2011   s/p DES mid and distal RCA with 50% LAD  . CKD stage 1 due to type 2 diabetes mellitus ([place])   . Daily headache    not daily  . Depression    Bipolar disorder  . Diabetic foot ulcer associated with diabetes mellitus due to underlying condition ([place]) 09/26/2018  . Diabetic gastroparesis associated with type 2 diabetes mellitus ([place])   . Diabetic peripheral neuropathy associated with type 2 diabetes mellitus ([place])   . Diabetic retinopathy   . Esophageal stenosis   . Esophageal ulcer   . Esophagitis   . Gastroparesis   . Genital herpes    Reportedly tested and documented by [facility]--rare occurrences  . GERD (gastroesophageal reflux disease)   . Heart murmur   . History of stomach ulcers   . Hyperlipidemia   . Hypertension   . 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 Migraines   . Orthopnoea   . Pneumonia 2012  . Polysubstance abuse ([place])    Crack cocaine--none since 2008, MJ, ETOH:  clean of all since 2008  . Renal insufficiency   . Renal lithiasis    Bilateral  . Rheumatoid arthritis(714.0)   . Sebaceous cyst   . Stroke [facility]) 2011   denies residual on 12/19/2012.  "Years ago"  . Type II diabetes mellitus (H[facility])    Previously uncontrolled for many years with multiple complications.  2017 controlled. 05/2016:  6.7%    Tobacco Use: Social History    Tobacco Use  Smoking Status Current Every Day Smoker  . Packs/day: 0.50  . Years: 22.00  . Pack years: 11.00  . Types: Cigarettes  Smokeless Tobacco Never Used  Tobacco Comment   Has been to classes--not sure if she wants to quit.  Changing to non menthol..  Chantix, patches, gum never helped.  9/19:  lot going on.    Labs: Recent Review Flowsheet Data    Labs for ITP Cardiac and Pulmonary Rehab Latest Ref Rng & Units 01/24/2019 01/30/2019 04/15/2019 09/08/2019 08/12/2020   Cholestrol 100 - 199 mg/dL 166 172 -  149 -   LDLCALC 0 - 99 mg/dL 69 66 - 66 -   HDL >39 mg/dL 82 89 - 70 -   Trlycerides 0 - 149 mg/dL 74 87 - 66 -   Hemoglobin A1c 4.8 - 5.6 % - - 7.0(H) 6.2(H) 6.7(H)   PHART 7.350 - 7.450 - - - - -   PCO2ART 35.0 - 45.0 mmHg - - - - -   HCO3 20.0 - 24.0 mEq/L - - - - -   TCO2 0 - 100 mmol/L - - - - -   ACIDBASEDEF 0.0 - 2.0 mmol/L - - - - -   O2SAT % - - - - -      Capillary Blood Glucose: Lab Results  Component Value Date   GLUCAP 111 (H) 11/12/2020   GLUCAP 86 11/12/2020   GLUCAP 61 (L) 11/12/2020   GLUCAP 66 (L) 11/12/2020   GLUCAP 70 11/12/2020     Exercise Target Goals: Exercise Program Goal: Individual exercise prescription set using results from initial 6 min walk test and THRR while considering  patient's activity barriers and safety.   Exercise Prescription Goal: Starting with aerobic activity 30 plus minutes a day, 3 days per week for initial exercise prescription. Provide home exercise prescription and guidelines that participant acknowledges understanding prior to discharge.  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 HR 91 bpm     Max Ex. BP 124/70     2 Minute Post BP 120/64            Oxygen Initial Assessment:   Oxygen Re-Evaluation:   Oxygen Discharge (Final Oxygen Re-Evaluation):   Initial Exercise Prescription:  Initial Exercise Prescription - 10/26/20 1300      Date of Initial Exercise RX and Referring Provider   Date 10/26/20    Referring Provider Larae Grooms, MD    Expected Discharge Date 12/24/20      Prescription Details   Frequency (times per week) 3    Duration Progress to 30 minutes of continuous aerobic without signs/symptoms of physical distress      Intensity   THRR 40-80% of Max Heartrate 67-134    Ratings of Perceived Exertion 11-13    Perceived Dyspnea 0-4      Progression   Progression Continue progressive overload as per policy without signs/symptoms or physical distress.      Horticulturist, commercial  Prescription Yes    Weight 2 lbs    Reps 10-15           Perform Capillary Blood Glucose checks as needed.  Exercise Prescription Changes:  Exercise Prescription Changes    Row Name 11/03/20 1600 11/17/20 1538           Response to Exercise   Blood Pressure (Admit) 106/54 110/52      Blood Pressure (Exercise) 104/70 120/70      Blood Pressure (Exit) 110/58 104/70      Heart Rate (Admit) 86 bpm 87 bpm      Heart Rate (Exercise) 92 bpm 94 bpm      Heart Rate (Exit) 83 bpm 84 bpm      Rating of Perceived Exertion (Exercise) 11 11      Symptoms None None      Comments Pt's first day of exercise in the CRP2 program Reviewed METs      Duration Continue with 30 min of aerobic exercise  without signs/symptoms of physical distress. Continue with 30 min of aerobic exercise without signs/symptoms of physical distress.      Intensity THRR unchanged THRR unchanged             Progression   Progression Continue to progress workloads to maintain intensity without signs/symptoms of physical distress. Continue to progress workloads to maintain intensity without signs/symptoms of physical distress.      Average METs 2.4 2.1             Resistance Training   Training Prescription No No      Weight No weights on Wednesdays No weights on Wednesdays             Interval Training   Interval Training No No             NuStep   Level 2 2      SPM 75 75      Minutes 30 30      METs 2.4 2.1             Exercise Comments:  Exercise Comments    Row Name 11/03/20 1626 11/17/20 1541         Exercise Comments Pt's first day in the CRP2 program. Pt tolerated session well. Reviewed METs today. Pt progress is slow due to several issues regarding her blood sugar control. The patient has missed several vists but has seen her endocrinologist and is cleared to resume the [place] program.             Exercise Goals and Review:  Exercise Goals    Row Name 10/26/20 1346             Exercise Goals   Increase Physical Activity Yes       Intervention Provide advice, education, support and counseling about physical activity/exercise needs.;Develop an individualized exercise prescription for aerobic and resistive training based on initial evaluation findings, risk stratification, comorbidities and participant's personal goals.       Expected Outcomes Short Term: Attend rehab on a regular basis to increase amount of physical activity.;Long Term: Add in home exercise to make exercise part of routine and to increase amount of physical activity.;Long Term: Exercising regularly at least 3-5 days a week.       Increase Strength and Stamina Yes       Intervention Provide advice, education,  support and counseling about physical activity/exercise needs.;Develop an individualized exercise prescription for aerobic and resistive training based  on initial evaluation findings, risk stratification, comorbidities and participant's personal goals.       Expected Outcomes Short Term: Increase workloads from initial exercise prescription for resistance, speed, and METs.;Short Term: Perform resistance training exercises routinely during rehab and add in resistance training at home;Long Term: Improve cardiorespiratory fitness, muscular endurance and strength as measured by increased METs and functional capacity (6MWT)       Able to understand and use rate of perceived exertion (RPE) scale Yes       Intervention Provide education and explanation on how to use RPE scale       Expected Outcomes Short Term: Able to use RPE daily in rehab to express subjective intensity level;Long Term:  Able to use RPE to guide intensity level when exercising independently       Knowledge and understanding of Target Heart Rate Range (THRR) Yes       Intervention Provide education and explanation of THRR including how the numbers were predicted and where they are located for reference       Expected Outcomes Short Term: Able to use daily as guideline for intensity in rehab;Long Term: Able to use THRR to govern intensity when exercising independently;Short Term: Able to state/look up THRR       Understanding of Exercise Prescription Yes       Intervention Provide education, explanation, and written materials on patient's individual exercise prescription       Expected Outcomes Short Term: Able to explain program exercise prescription;Long Term: Able to explain home exercise prescription to exercise independently              Exercise Goals Re-Evaluation :  Exercise Goals Re-Evaluation    Row Name 11/03/20 1624             Exercise Goal Re-Evaluation   Exercise Goals Review Increase Physical Activity;Increase  Strength and Stamina;Able to understand and use rate of perceived exertion (RPE) scale;Knowledge and understanding of Target Heart Rate Range (THRR);Understanding of Exercise Prescription       Comments Pt's first day of exercise in the CRP2 program. Pt understands the Exercise Rx, THRR, RPE scale.       Expected Outcomes Will continue to monitor patient and increase exercise workloads as tolerated.               Discharge Exercise Prescription (Final Exercise Prescription Changes):  Exercise Prescription Changes - 11/17/20 1538      Response to Exercise   Blood Pressure (Admit) 110/52    Blood Pressure (Exercise) 120/70    Blood Pressure (Exit) 104/70    Heart Rate (Admit) 87 bpm    Heart Rate (Exercise) 94 bpm    Heart Rate (Exit) 84 bpm    Rating of Perceived Exertion (Exercise) 11    Symptoms None    Comments Reviewed METs    Duration Continue with 30 min of aerobic exercise without signs/symptoms of physical distress.    Intensity THRR unchanged      Progression   Progression Continue to progress workloads to maintain intensity without signs/symptoms of physical distress.    Average METs 2.1      Resistance Training   Training Prescription No    Weight No weights on Wednesdays      Interval Training   Interval Training No      NuStep   Level 2    SPM 75    Minutes 30    METs 2.1  Nutrition:  Target Goals: Understanding of nutrition guidelines, daily intake of sodium <1527m, cholesterol <2037m calories 30% from fat and 7% or less from saturated fats, daily to have 5 or more servings of fruits and vegetables.  Biometrics:  Pre Biometrics - 10/26/20 1327      Pre Biometrics   Waist Circumference 41 inches    Hip Circumference 45 inches    Waist to Hip Ratio 0.91 %    Triceps Skinfold 31 mm    % Body Fat 42.6 %    Grip Strength 25 kg    Flexibility 14.5 in    Single Leg Stand 4.75 seconds            Nutrition Therapy Plan and Nutrition  Goals:  Nutrition Therapy & Goals - 11/19/20 1355      Nutrition Therapy   Diet TLC; carb modified      Personal Nutrition Goals   Nutrition Goal Pt to build a healthy plate including vegetables, fruits, whole grains, and low-fat dairy products in a heart healthy meal plan.    Personal Goal #2 Pt to reduce time spent in hypoglycemia <1% per CGM    Personal Goal #3 CBG concentrations in the normal range or as close to normal as is safely possible.      Intervention Plan   Intervention Prescribe, educate and counsel regarding individualized specific dietary modifications aiming towards targeted core components such as weight, hypertension, lipid management, diabetes, heart failure and other comorbidities.;Nutrition handout(s) given to patient.    Expected Outcomes Short Term Goal: A plan has been developed with personal nutrition goals set during dietitian appointment.;Long Term Goal: Adherence to prescribed nutrition plan.           Nutrition Assessments:  MEDIFICTS Score Key:  ?70 Need to make dietary changes   40-70 Heart Healthy Diet  ? 40 Therapeutic Level Cholesterol Diet  Flowsheet Row CARDIAC REHAB PHASE II EXERCISE [facility] Total Score on Admission 49     Picture Your Plate Scores:  <4<74nhealthy dietary pattern with much room for improvement.  41-50 Dietary pattern unlikely to meet recommendations for good health and room for improvement.  51-60 More healthful dietary pattern, with some room for improvement.   >60 Healthy dietary pattern, although there may be some specific behaviors that could be improved.    Nutrition Goals Re-Evaluation:  Nutrition Goals Re-Evaluation    [place] 11/19/20 1400             Goals   Current Weight 171 lb 1.2 oz (77.6 kg)       Nutrition Goal Pt to build a healthy plate including vegetables, fruits, whole grains, and low-fat dairy products in a heart  healthy meal plan.               Personal Goal #2 Re-Evaluation   Personal Goal #2 Pt to reduce time spent in hypoglycemia <1% per CGM               Personal Goal #3 Re-Evaluation   Personal Goal #3 CBG concentrations in the normal range or as close to normal as is safely possible.              Nutrition Goals Discharge (Final Nutrition Goals Re-Evaluation):  Nutrition Goals Re-Evaluation - 11/19/20 1400      Goals   Current Weight 171 lb 1.2 oz (77.6 kg)    Nutrition Goal Pt  to build a healthy plate including vegetables, fruits, whole grains, and low-fat dairy products in a heart healthy meal plan.      Personal Goal #2 Re-Evaluation   Personal Goal #2 Pt to reduce time spent in hypoglycemia <1% per CGM      Personal Goal #3 Re-Evaluation   Personal Goal #3 CBG concentrations in the normal range or as close to normal as is safely possible.           Psychosocial: Target Goals: Acknowledge presence or absence of significant depression and/or stress, maximize coping skills, provide positive support system. Participant is able to verbalize types and ability to use techniques and skills needed for reducing stress and depression.  Initial Review & Psychosocial Screening:  Initial Psych Review & Screening - 10/26/20 1440      Initial Review   Current issues with History of Depression      Family Dynamics   Good Support System? Yes   Aleli has her children and friends who live [place] for support. Mileena is taking care of a 54 year old currently     Barriers   Psychosocial barriers to participate in program The patient should benefit from training in stress management and relaxation.      Screening Interventions   Interventions Encouraged to exercise    Expected Outcomes Long Term Goal: Stressors or current issues are controlled or eliminated.           Quality of Life Scores:  Quality of Life - 10/26/20 1331      Quality of Life   Select Quality of Life      Quality  of Life Scores   Health/Function Pre 21.97 %    Socioeconomic Pre 24.64 %    Psych/Spiritual Pre 28.93 %    Family Pre 25.5 %    GLOBAL Pre 24.44 %          Scores of 19 and below usually indicate a poorer quality of life in these areas.  A difference of  2-3 points is a clinically meaningful difference.  A difference of 2-3 points in the total score of the Quality of Life Index has been associated with significant improvement in overall quality of life, self-image, physical symptoms, and general health in studies assessing change in quality of life.  PHQ-9: Recent Review Flowsheet Data    Depression screen [facility] 2/9 10/26/2020 11/21/2019   Decreased Interest 0 0   Down, Depressed, Hopeless 0 0   PHQ - 2 Score 0 0   Altered sleeping - 0   Tired, decreased energy - 0   Change in appetite - 0   Feeling bad or failure about yourself  - 0   Trouble concentrating - 0   Moving slowly or fidgety/restless - 0   Suicidal thoughts - 0   PHQ-9 Score - 0   Difficult doing work/chores - Not difficult at all     Interpretation of Total Score  Total Score Depression Severity:  1-4 = Minimal depression, 5-9 = Mild depression, 10-14 = Moderate depression, 15-19 = Moderately severe depression, 20-27 = Severe depression   Psychosocial Evaluation and Intervention:   Psychosocial Re-Evaluation:  Psychosocial Re-Evaluation    [place] Name 11/23/20 1537             Psychosocial Re-Evaluation   Current issues with History of Depression       Comments Elgene has not voiced any increased stressors since participating in program. Clemmie continues to care for  a friends young daughter       Expected Outcomes Addie will have decreased stressors upon completion of phase 2 cardiac rehab       Interventions Stress management education;Encouraged to attend Cardiac Rehabilitation for the exercise       Continue Psychosocial Services  Follow up required by staff              Psychosocial Discharge (Final  Psychosocial Re-Evaluation):  Psychosocial Re-Evaluation - 11/23/20 1537      Psychosocial Re-Evaluation   Current issues with History of Depression    Comments Ireene has not voiced any increased stressors since participating in program. Minda continues to care for a friends young daughter    Expected Outcomes Enzley will have decreased stressors upon completion of phase 2 cardiac rehab    Interventions Stress management education;Encouraged to attend Cardiac Rehabilitation for the exercise    Continue Psychosocial Services  Follow up required by staff           Vocational Rehabilitation: Provide vocational rehab assistance to qualifying candidates.   Vocational Rehab Evaluation & Intervention:  Vocational Rehab - 10/26/20 1442      Initial Vocational Rehab Evaluation & Intervention   Assessment shows need for Vocational Rehabilitation No   Kimela is currently on disability and does not need vocational rehab at this time          Education: Education Goals: Education classes will be provided on a weekly basis, covering required topics. Participant will state understanding/return demonstration of topics presented.  Learning Barriers/Preferences:  Learning Barriers/Preferences - 10/26/20 1332      Learning Barriers/Preferences   Learning Barriers Sight   Blind in left eye   Learning Preferences Verbal Instruction;Computer/Internet;Audio           Education Topics: Hypertension, Hypertension Reduction -Define heart disease and [place] blood pressure. Discus how [place] blood pressure affects the body and ways to reduce [place] blood pressure.   Exercise and Your Heart -Discuss why it is important to exercise, the FITT principles of exercise, normal and abnormal responses to exercise, and how to exercise safely.   Angina -Discuss definition of angina, causes of angina, treatment of angina, and how to decrease risk of having angina.   Cardiac Medications -Review what the following  cardiac medications are used for, how they affect the body, and side effects that may occur when taking the medications.  Medications include Aspirin, Beta blockers, calcium channel blockers, ACE Inhibitors, angiotensin receptor blockers, diuretics, digoxin, and antihyperlipidemics.   Congestive Heart Failure -Discuss the definition of CHF, how to live with CHF, the signs and symptoms of CHF, and how keep track of weight and sodium intake.   Heart Disease and Intimacy -Discus the effect sexual activity has on the heart, how changes occur during intimacy as we age, and safety during sexual activity.   Smoking Cessation / COPD -Discuss different methods to quit smoking, the health benefits of quitting smoking, and the definition of COPD.   Nutrition I: Fats -Discuss the types of cholesterol, what cholesterol does to the heart, and how cholesterol levels can be controlled.   Nutrition II: Labels -Discuss the different components of food labels and how to read food label   Heart Parts/Heart Disease and PAD -Discuss the anatomy of the heart, the pathway of blood circulation through the heart, and these are affected by heart disease.   Stress I: Signs and Symptoms -Discuss the causes of stress, how stress may lead to anxiety and depression,  and ways to limit stress.   Stress II: Relaxation -Discuss different types of relaxation techniques to limit stress.   Warning Signs of Stroke / TIA -Discuss definition of a stroke, what the signs and symptoms are of a stroke, and how to identify when someone is having stroke.   Knowledge Questionnaire Score:  Knowledge Questionnaire Score - 10/26/20 1333      Knowledge Questionnaire Score   Pre Score 24/28           Core Components/Risk Factors/Patient Goals at Admission:  Personal Goals and Risk Factors at Admission - 10/26/20 1333      Core Components/Risk Factors/Patient Goals on Admission    Weight Management Yes;Weight Loss     Intervention Weight Management: Develop a combined nutrition and exercise program designed to reach desired caloric intake, while maintaining appropriate intake of nutrient and fiber, sodium and fats, and appropriate energy expenditure required for the weight goal.;Weight Management: Provide education and appropriate resources to help participant work on and attain dietary goals.;Weight Management/Obesity: Establish reasonable short term and long term weight goals.    Admit Weight 167 lb 1.7 oz (75.8 kg)    Expected Outcomes Understanding of distribution of calorie intake throughout the day with the consumption of 4-5 meals/snacks;Weight Loss: Understanding of general recommendations for a balanced deficit meal plan, which promotes 1-2 lb weight loss per week and includes a negative energy balance of [phone] kcal/d;Understanding recommendations for meals to include 15-35% energy as protein, 25-35% energy from fat, 35-60% energy from carbohydrates, less than 257m of dietary cholesterol, 20-35 gm of total fiber daily;Weight Maintenance: Understanding of the daily nutrition guidelines, which includes 25-35% calories from fat, 7% or less cal from saturated fats, less than 2054mcholesterol, less than 1.5gm of sodium, & 5 or more servings of fruits and vegetables daily;Long Term: Adherence to nutrition and physical activity/exercise program aimed toward attainment of established weight goal;Short Term: Continue to assess and modify interventions until short term weight is achieved    Tobacco Cessation Yes    Number of packs per day 2    Intervention Assist the participant in steps to quit. Provide individualized education and counseling about committing to Tobacco Cessation, relapse prevention, and pharmacological support that can be provided by physician.;OfAdvice workerassist with locating and accessing local/national Quit Smoking programs, and support quit date choice.    Expected Outcomes  Short Term: Will demonstrate readiness to quit, by selecting a quit date.;Short Term: Will quit all tobacco product use, adhering to prevention of relapse plan.;Long Term: Complete abstinence from all tobacco products for at least 12 months from quit date.    Diabetes Yes    Intervention Provide education about signs/symptoms and action to take for hypo/hyperglycemia.;Provide education about proper nutrition, including hydration, and aerobic/resistive exercise prescription along with prescribed medications to achieve blood glucose in normal ranges: Fasting glucose 65-99 mg/dL    Expected Outcomes Short Term: Participant verbalizes understanding of the signs/symptoms and immediate care of hyper/hypoglycemia, proper foot care and importance of medication, aerobic/resistive exercise and nutrition plan for blood glucose control.;Long Term: Attainment of HbA1C < 7%.    Hypertension Yes    Intervention Provide education on lifestyle modifcations including regular physical activity/exercise, weight management, moderate sodium restriction and increased consumption of fresh fruit, vegetables, and low fat dairy, alcohol moderation, and smoking cessation.;Monitor prescription use compliance.    Expected Outcomes Short Term: Continued assessment and intervention until BP is < 140/9027mG in hypertensive participants. < 130/75m62m in hypertensive  participants with diabetes, heart failure or chronic kidney disease.;Long Term: Maintenance of blood pressure at goal levels.    Lipids Yes    Intervention Provide education and support for participant on nutrition & aerobic/resistive exercise along with prescribed medications to achieve LDL <30m, HDL >434m    Expected Outcomes Short Term: Participant states understanding of desired cholesterol values and is compliant with medications prescribed. Participant is following exercise prescription and nutrition guidelines.;Long Term: Cholesterol controlled with medications as  prescribed, with individualized exercise RX and with personalized nutrition plan. Value goals: LDL < 7039mHDL > 40 mg.           Core Components/Risk Factors/Patient Goals Review:   Goals and Risk Factor Review    Row Name 11/23/20 1545             Core Components/Risk Factors/Patient Goals Review   Personal Goals Review Weight Management/Obesity;Hypertension;Tobacco Cessation;Lipids;Diabetes       Review Gwyneth has been doing well with exercise. CBG's have been improved since insulin dose was decreased by Dr KerBuddy Dutyontinue to encourage smoking cessation       Expected Outcomes Annamay will continue to participate in phase 2 cardiac rehab for exercie nutrtion and lifestyle modifications              Core Components/Risk Factors/Patient Goals at Discharge (Final Review):   Goals and Risk Factor Review - 11/23/20 1545      Core Components/Risk Factors/Patient Goals Review   Personal Goals Review Weight Management/Obesity;Hypertension;Tobacco Cessation;Lipids;Diabetes    Review Kasia has been doing well with exercise. CBG's have been improved since insulin dose was decreased by Dr KerBuddy Dutyontinue to encourage smoking cessation    Expected Outcomes Avangeline will continue to participate in phase 2 cardiac rehab for exercie nutrtion and lifestyle modifications           ITP Comments:  ITP Comments    Row Name 10/26/20 1203 11/23/20 1536         ITP Comments Dr TraFransico Him, Medical Director 30 Day ITP Review. Arantxa has good particiation and fair attendance in phase 2 cardiac rehab.             Comments: See ITP comments.MarBarnet PallN,BSN 11/23/2020 3:48 PM

## 2020-11-24 ENCOUNTER — Encounter (HOSPITAL_COMMUNITY): Payer: Medicaid Other

## 2020-11-24 ENCOUNTER — Telehealth (HOSPITAL_COMMUNITY): Payer: Self-pay | Admitting: Internal Medicine

## 2020-11-25 ENCOUNTER — Telehealth: Payer: Medicaid Other | Admitting: Clinical

## 2020-11-26 ENCOUNTER — Telehealth (INDEPENDENT_AMBULATORY_CARE_PROVIDER_SITE_OTHER): Payer: Medicaid Other | Admitting: Clinical

## 2020-11-26 ENCOUNTER — Encounter (HOSPITAL_COMMUNITY): Payer: Medicaid Other

## 2020-11-26 DIAGNOSIS — F609 Personality disorder, unspecified: Secondary | ICD-10-CM

## 2020-11-29 ENCOUNTER — Encounter (HOSPITAL_COMMUNITY): Payer: Medicaid Other

## 2020-11-29 ENCOUNTER — Telehealth (HOSPITAL_COMMUNITY): Payer: Self-pay | Admitting: *Deleted

## 2020-11-29 NOTE — Telephone Encounter (Signed)
Left message to call cardiac rehab.Gladstone Lighter, RN,BSN 11/29/2020 2:08 PM

## 2020-11-29 NOTE — Telephone Encounter (Signed)
Spoke with Vadis she plans to return to exercise on Wednesday she is feeling better post vaccine booster.Gladstone Lighter, RN,BSN 11/29/2020 2:16 PM

## 2020-12-01 ENCOUNTER — Other Ambulatory Visit: Payer: Self-pay

## 2020-12-01 ENCOUNTER — Encounter (HOSPITAL_COMMUNITY)
Admission: RE | Admit: 2020-12-01 | Discharge: 2020-12-01 | Disposition: A | Discharge status: Home / Self Care | Payer: Medicaid Other | Source: Ambulatory Visit | Attending: Interventional Cardiology | Admitting: Interventional Cardiology

## 2020-12-01 DIAGNOSIS — Z955 Presence of coronary angioplasty implant and graft: Secondary | ICD-10-CM | POA: Diagnosis present

## 2020-12-03 ENCOUNTER — Other Ambulatory Visit: Payer: Self-pay

## 2020-12-03 ENCOUNTER — Encounter (HOSPITAL_COMMUNITY)
Admission: RE | Admit: 2020-12-03 | Discharge: 2020-12-03 | Disposition: A | Discharge status: Home / Self Care | Payer: Medicaid Other | Source: Ambulatory Visit | Attending: Interventional Cardiology | Admitting: Interventional Cardiology

## 2020-12-03 DIAGNOSIS — Z955 Presence of coronary angioplasty implant and graft: Secondary | ICD-10-CM

## 2020-12-03 NOTE — Progress Notes (Signed)
See note from RD relating to hypoglycemia. Will forward to Dr Sharl Ma of review. Exit CBG 83 via fingerstick. Patient left cardiac rehab without symptoms.Will continue to monitor the patient throughout  the program.[name] Prindle Harlon Flor, RN,BSN 12/03/2020 3:40 PM

## 2020-12-03 NOTE — Progress Notes (Signed)
Nutrition Note  Spoke with pt during exercise. She reported taking 3 units insulin prior to exercise with only small amount of rice/chicken. Instructed pt to check CGM and her blood glucose was 60 mg/dl (continued to drop to 54 mg/dl while getting snack). She stopped exercising and had 15-30 g carbs (8 oz lemonade and 2 mints). Rechecked with fingerstick 15 minutes later and CBG was 83. Provided pt with crackers/peanut butter and sent home to eat meal and continue monitoring CGM. Instructed pt to bring 100% fruit juice to exercise from now on to avoid exercise induced hypoglycemia. RN consulted and collaborated with pt and RD for plan to reduce hypoglycemia.  Will continue to monitor pt during cardiac rehab.  Andrey Campanile, MS, RDN, LDN

## 2020-12-06 ENCOUNTER — Other Ambulatory Visit: Payer: Self-pay

## 2020-12-06 ENCOUNTER — Encounter (HOSPITAL_COMMUNITY)
Admission: RE | Admit: 2020-12-06 | Discharge: 2020-12-06 | Disposition: A | Discharge status: Home / Self Care | Payer: Medicaid Other | Source: Ambulatory Visit | Attending: Interventional Cardiology | Admitting: Interventional Cardiology

## 2020-12-06 DIAGNOSIS — Z955 Presence of coronary angioplasty implant and graft: Secondary | ICD-10-CM | POA: Diagnosis not present

## 2020-12-06 NOTE — Progress Notes (Signed)
   THERAPY PROGRESS NOTE VIA GOOGLE DUO  Session Time: 2p-3p Participation Level: Active Behavioral Response: CasualAlertEuthymic Type of Therapy: Individual Therapy  Treatment Goals addressed: Learning coping skills to manage anger   Purpose: LCSW met with client for routine individual therapy to work towards treatment goals: Learning coping skills to manage anger  Intervention: LCSW met with patient for routine indvidual therapy via google duo virtual session. LCSW provided patient opportunity to check in to assess for any significant events and how she is doing today. LCSW utilized intervention of Other: Relapse Prevention and Strength Based  as patient processed recent update LCSW was able to utilize to discuss relapse prevention techniques and approaches. This session used strength based as well to reflect on the past year as patient has been engaged in therapy. Patient shared the areas of growth and would like to continue to progress on. Patient processed her growth in relationship within herself and her loved ones. LCSW praised patient on recognizing her growth in progress. LCSW assessed for SI/HI/command psychosis.  Effectiveness: Patient is alert x4 affect. Patient reports somewhat feeling better, reported during her visit with her doctor she fainted due to sugars. However emotionally is doing pretty good. Patient processed the father of the teenager she is caring off did provide financial support as requested but also asked her if he can't find a place to stay at her house. Patient reported she was able to keep grounded and respect her boundaries and said no. Session began to transition to relapse prevention as patient processed the father of the kid and her ex partner T addiction patterns. Patient shared she is recognizing these are individuals to not keep around due to their continued addictive thinking. Patient processed how she did her own recovery treatment because she wanted to change  her lifestyle. Patient reported how not only actions have to change but everything from thinking, the people, environment, behavior, etc. Patient recognized these individuals and the situation she is in can be triggering. Patient reports what has helped maintain her recovery is the accountability from family members her daughter and granddaughter, therapy where she is able to process and express her emotions in a nonjudgmental way and her PCP.   Patient reflected on her year of treatment she has not gotten in trouble with the law or hurt anybody. Patient reports she reports has been able to process her emotions in a safe place without exploding. Patient reports she feels she has grown to become more humble towards herself. Patient reports she is understanding the meaning of loving herself and her health for her wellbeing and for her loved ones. Patient reports she bought herself a new car, and her relationship with her daughter is growing. Patient reports these good things that have occurred has helped with her continued recovery process.   Intervention was effective as patient was able to reflect on her year of progress. Patient recognizes she wants to continue to learn to manage her stressors and emotion of anger.  Progress towards goal is Ongoing. Patient denied active suicidal/homicidal/active psychosis.  Plan Patient offered next appointment for: 05/12 9am  Diagnosis: Unspecified personality Disorder    [name], LCSW 12/06/2020

## 2020-12-08 ENCOUNTER — Other Ambulatory Visit: Payer: Self-pay

## 2020-12-08 ENCOUNTER — Encounter (HOSPITAL_COMMUNITY)
Admission: RE | Admit: 2020-12-08 | Discharge: 2020-12-08 | Disposition: A | Discharge status: Home / Self Care | Payer: Medicaid Other | Source: Ambulatory Visit | Attending: Interventional Cardiology | Admitting: Interventional Cardiology

## 2020-12-08 DIAGNOSIS — Z955 Presence of coronary angioplasty implant and graft: Secondary | ICD-10-CM

## 2020-12-09 ENCOUNTER — Ambulatory Visit: Payer: Medicaid Other | Admitting: Clinical

## 2020-12-09 DIAGNOSIS — F609 Personality disorder, unspecified: Secondary | ICD-10-CM

## 2020-12-09 NOTE — Progress Notes (Signed)
Reviewed home exercise Rx with patient today. Encouraged warm-up, cool-down, and stretching. Reviewed THRR of 67-134 and keeping RPE between 11-13. Discussed weather parameters for temperature and humidity for safe exercise outdoors. Hydration encouraged with activity. Reviewed S/S to terminate exercise and when to call MD vs 911. Pt verbalized understanding of use of NTG through tech back method and does carry at all times. Pt also encouraged to carry her phone for safety and to check her blood sugars. Pt was also encouraged to carry a rapid acting glucose to treat hypoglycemia. She voices she does carry that in her fanny pack on her walks. Pt was provided and copy of the home exercise Rx and verbalized understanding of the home exercise Rx.    Lorin Picket MS, ACSM-CEP, CCRP

## 2020-12-09 NOTE — Progress Notes (Signed)
LCSW was able to provide a brief check in, patient reported today she was doing good. LCSW utilized [employer] for virtual session unfortunately due to wifi/internet connection technical difficulties session was interrupted with delays at [facility] location.  LCSW attempted using [facility] wifi but it presented with delays at location of LCSW. Patient and LCSW both agreed to reschedule for the following day at 10am virtual again. Will discuss further transitioning to in person to avoid delays.

## 2020-12-10 ENCOUNTER — Other Ambulatory Visit: Payer: Self-pay

## 2020-12-10 ENCOUNTER — Telehealth (INDEPENDENT_AMBULATORY_CARE_PROVIDER_SITE_OTHER): Payer: Medicaid Other | Admitting: Clinical

## 2020-12-10 ENCOUNTER — Encounter (HOSPITAL_COMMUNITY)
Admission: RE | Admit: 2020-12-10 | Discharge: 2020-12-10 | Disposition: A | Discharge status: Home / Self Care | Payer: Medicaid Other | Source: Ambulatory Visit | Attending: Interventional Cardiology | Admitting: Interventional Cardiology

## 2020-12-10 DIAGNOSIS — Z955 Presence of coronary angioplasty implant and graft: Secondary | ICD-10-CM | POA: Diagnosis not present

## 2020-12-10 DIAGNOSIS — F609 Personality disorder, unspecified: Secondary | ICD-10-CM

## 2020-12-10 LAB — GLUCOSE, CAPILLARY: Glucose-Capillary: 285 mg/dL — ABNORMAL HIGH (ref 70–99)

## 2020-12-10 NOTE — Progress Notes (Signed)
Cardiac Rehab Note   Spoke with pt during exercise.  CGM was >300 after pt started exercising due to drinking gatorade prior to walking into rehab. She took 6 units insulin at 12:30 pm. This was 45 minutes prior to exercise/gatorade intake (no food intake with insulin at 12:30 pm).  She stopped exercising and was about to take 8 units insulin (per sliding scale). Recommended pt NOT stack insulin. We performed a fingerstick CBG which was 285 mg/dl. Pt states she thought her insulin didn't go into her skin during injection at 12:30 pm. Assessed insulin injection technique. We reviewed site rotation to avoid scar tissue. She has not been priming her pen before each use. Instructed pt on air shot/priming technique for novolog pen. Pt verbalized understanding.  Pt able to complete cardiac rehab session.  Will continue to monitor during cardiac rehab.   Andrey Campanile, MS, RDN, LDN

## 2020-12-11 ENCOUNTER — Other Ambulatory Visit: Payer: Self-pay | Admitting: Internal Medicine

## 2020-12-13 ENCOUNTER — Encounter (HOSPITAL_COMMUNITY)
Admission: RE | Admit: 2020-12-13 | Discharge: 2020-12-13 | Disposition: A | Discharge status: Home / Self Care | Payer: Medicaid Other | Source: Ambulatory Visit | Attending: Interventional Cardiology | Admitting: Interventional Cardiology

## 2020-12-13 ENCOUNTER — Other Ambulatory Visit: Payer: Self-pay | Admitting: Internal Medicine

## 2020-12-13 ENCOUNTER — Other Ambulatory Visit: Payer: Self-pay

## 2020-12-13 DIAGNOSIS — Z955 Presence of coronary angioplasty implant and graft: Secondary | ICD-10-CM | POA: Diagnosis not present

## 2020-12-13 NOTE — Progress Notes (Signed)
CBG 253 post exercise pre exercise. Patient did not have her CGM on today. Patient did not take lunchtime insulin. Sorah ate a hearty lunch. Patient checked her CBG 22 minutes into exercise with her own meter. CBG 336. Exercise stopped. Kareena left cardiac rehab without complaints. Zamyia plans to return to exercise on Wednesday.Gladstone Lighter, RN,BSN 12/13/2020 2:26 PM

## 2020-12-15 ENCOUNTER — Telehealth (HOSPITAL_COMMUNITY): Payer: Self-pay

## 2020-12-15 ENCOUNTER — Encounter (HOSPITAL_COMMUNITY)
Admission: RE | Admit: 2020-12-15 | Discharge: 2020-12-15 | Disposition: A | Discharge status: Home / Self Care | Payer: Medicaid Other | Source: Ambulatory Visit | Attending: Interventional Cardiology | Admitting: Interventional Cardiology

## 2020-12-15 ENCOUNTER — Other Ambulatory Visit: Payer: Self-pay

## 2020-12-15 DIAGNOSIS — Z955 Presence of coronary angioplasty implant and graft: Secondary | ICD-10-CM | POA: Diagnosis not present

## 2020-12-15 NOTE — Telephone Encounter (Signed)
Per pt cardiac rehab EP [name] [name] pt wanted to extend her cardiac rehab sessions until 6/3. Extended pt cardiac rehab sessions.

## 2020-12-17 ENCOUNTER — Encounter (HOSPITAL_COMMUNITY): Payer: Medicaid Other

## 2020-12-17 ENCOUNTER — Telehealth (HOSPITAL_COMMUNITY): Payer: Self-pay | Admitting: Internal Medicine

## 2020-12-17 NOTE — Progress Notes (Signed)
   THERAPY PROGRESS NOTE Via google duo   Session Time: 10-11 Participation Level: Active Behavioral Response: CasualAlertEuthymic Type of Therapy: Individual Therapy Treatment Goals addressed: Anger and Coping   Purpose: LCSW met with client for routine individual therapy to work towards treatment goals: coping with anger  Intervention: LCSW met with patient for routine individual therapy via virtual due to previous day internet delayed session. LCSW provided patient opportunity to check in to assess how she is doing since last seen. LCSW provided reflective listening as patient processed updates. In this session patient processed perspective on boundaries she has been setting and it's influence on her health and mental health. Patient processed her recovery for tobacco use using stages of change and how she is beginning the process to work towards recovery.LCSW utilized intervention of Motivational Interviewing as patient addressed her motivation for change. Discussed what these changes and how to cope with them. LCSW provided skill of deep breathing and using her motivation statements combined, discussed how this could be used to manage urges and cravings.  LCSW assessed for SI/HI/command psychosis.  Effectiveness: Patient is alert x4 affect. Patient reports update with T, as he was missing a couple days she helped his mom by looking for him, patient processed this was the last time doing this for T. Patient reports she expressed she is no longer going to support him. Patient reports, "I can no longer put peoples problem on me." Patient identified T is like an addiction for her, patient reports, "God will lead me, he helped me stop my addiction I am happy."   Patient reports she is looking at her growth by putting boundaries with negative individuals. Patient processed how much her daughter and herself relationship has improved, and hearing her daughter express understanding and concerns for her  is also motivating her to keep accountability. Patient reports recognizing these negative factors (people, thoughts and things) is only doing harm to her, "I am putting my health at risk. I got to take care of me. I want to be happy."  Patient reports she has reflected on her tobacco use, recognized by putting boundaries on the stressor (this case T), she is motivated to work towards stopping her tobacco use. Patient reports she bought nicotine gum, hasn't decided on a stop date, but is on her last pack of cigarettes. Patient reports, "I am sick of the smell, and cluttering my heart with this bad habit." Patient reports she wants to be healthy to be in her daughters and granddaughters life. Patient participated in the deep breathing exercises combined with motivation. Although it was brief, patient reports she found it effective and will be begin to practice this skill.  Intervention was effective as patient was able to reflect. Progress towards goal is Ongoing. Patient denied active suicidal/homicidal/active psychosis.  Plan Patient offered next appointment for: 05/27 10am  Diagnosis: unspecified personality disorder and tobacco use disorder    [name], LCSW 12/17/2020

## 2020-12-20 ENCOUNTER — Other Ambulatory Visit: Payer: Medicaid Other | Admitting: Internal Medicine

## 2020-12-20 ENCOUNTER — Other Ambulatory Visit: Payer: Self-pay

## 2020-12-20 ENCOUNTER — Encounter (HOSPITAL_COMMUNITY)
Admission: RE | Admit: 2020-12-20 | Discharge: 2020-12-20 | Disposition: A | Discharge status: Home / Self Care | Payer: Medicaid Other | Source: Ambulatory Visit | Attending: Interventional Cardiology | Admitting: Interventional Cardiology

## 2020-12-20 DIAGNOSIS — Z955 Presence of coronary angioplasty implant and graft: Secondary | ICD-10-CM

## 2020-12-20 NOTE — Progress Notes (Signed)
Incomplete Session Note  Patient Details  Name: [name] [name] MRN: [id number] Date of Birth: [birth year] Referring Provider:   Flowsheet Row CARDIAC REHAB PHASE II ORIENTATION from 10/26/2020 in [facility] CARDIAC REHAB  Referring Provider Lance Muss, MD      [name] [name] did not complete her rehab session. Pt arrived for exercise at Cardiac rehab. Pt pre exercise blood glucose reading was 58.  Pt ate prior to arrival 2 eggs, 2 fruit cups, 1 chicken leg and rice.  Pt had 10 units of lantus and 3 units of novolog  This triggered [name] [name] to become  visible upset crying about ongoing stress and frustration.  [name] [name] has a 54 year old girl who is staying with her because the father is in drug rehab.  Kimberlie can no longer provide for the child both financially and emotionally due to her health.  This stress causes her blood glucose to swing from Santy to very low with no symptoms.  [name] [name] spoke with the Psychologist this morning and plans were made for [name] [name] to have the child enter the foster system through social service.  [name] [name] feels bad and is afraid she may be mistreated especially since the child is autistic.  Allowed [name] [name] to talk it out and this made her feel better.  During this exchange blood glucose was monitored as follows: 71 at 1:32, 76 at 1:34, 79 at 1:43, 85 at  2:00 and 128 at  2:15.  Pt denies any symptoms and Pt wave form on her meter is showing stable.  Pt strongly advised on what is an acceptable blood glucose for driving and exercise for her remaining sessions.  Pt should not drive when her blood glucose is less than 100. Reminded [name] [name] that it takes a longer period of time for the glucose to get into her system and eating prior to leaving for rehab does not give her body the time it needs to absorb the glucose.  [name] [name] verbalizes understanding will see if she is able to implement this in her daily living as she desires to live a long time. Alanson Aly, BSN Cardiac and Advertising account planner

## 2020-12-21 NOTE — Progress Notes (Signed)
Cardiac Individual Treatment Plan  Patient Details  Name: [name] [name] MRN: [id number] Date of Birth: 01/10/1966 Referring Provider:   Flowsheet Row CARDIAC REHAB PHASE II ORIENTATION from 10/26/2020 in [facility]  Referring Provider Larae Grooms, MD      Initial Encounter Date:  [place] PHASE II ORIENTATION from 10/26/2020 in [facility]  Date 10/26/20      Visit Diagnosis: 08/13/20 S/P DES LAD  Patient's Home Medications on Admission:  Current Outpatient Medications:  .  Accu-Chek FastClix Lancets MISC, CHECK BLOOD SUGAR 5 TIMES DAILY, Disp: , Rfl:  .  ACCU-CHEK GUIDE test strip, USE AS DIRECTED CHECK BLOOD SUGAR 5 TIMES A DAY, Disp: , Rfl:  .  acetaminophen (TYLENOL) 500 MG tablet, Take 500-1,000 mg by mouth every 6 (six) hours as needed for moderate pain. , Disp: , Rfl:  .  albuterol (PROVENTIL HFA;VENTOLIN HFA) 108 (90 Base) MCG/ACT inhaler, Inhale 2 puffs into the lungs every 6 (six) hours as needed for wheezing or shortness of breath., Disp: 1 Inhaler, Rfl: 0 .  ammonium lactate (AMLACTIN) 12 % cream, Apply topically as needed for dry skin., Disp: 385 g, Rfl: 0 .  Blood Glucose Monitoring Suppl (ACCU-CHEK GUIDE ME) w/Device KIT, USE TO MONITOR BLOOD SUGAR FIVE TIMES A DAY DX  E10.22 IN VITRO, Disp: , Rfl:  .  Cholecalciferol 25 MCG (1000 UT) capsule, Take 1 capsule (1,000 Units total) by mouth daily., Disp: 30 capsule, Rfl: 3 .  clopidogrel (PLAVIX) 75 MG tablet, Take 1 tablet (75 mg total) by mouth daily., Disp: 90 tablet, Rfl: 3 .  cyclobenzaprine (FLEXERIL) 10 MG tablet, Take 1-2 tablets by mouth at bedtime as needed for muscle spasms. Muscle spasms, Disp: , Rfl: 5 .  diphenhydrAMINE (BENADRYL) 25 MG tablet, Take 25 mg by mouth every 6 (six) hours as needed for itching or allergies., Disp: , Rfl:  .  famotidine (PEPCID) 20 MG tablet, Take 20 mg by mouth 2 (two) times daily., Disp: , Rfl:  .   folic acid (FOLVITE) 935 MCG tablet, Take 1,200 mcg by mouth daily., Disp: , Rfl:  .  furosemide (LASIX) 40 MG tablet, TAKE 1 TABLET BY MOUTH EVERY DAY IN THE MORNING, Disp: 30 tablet, Rfl: 11 .  gabapentin (NEURONTIN) 100 MG capsule, TAKE 1 CAPSULE BY MOUTH AT BEDTIME., Disp: 30 capsule, Rfl: 0 .  Incontinence Supply Disposable (POISE ULTRA THINS) PADS, Use 5 pads daily as needed for urinary stress incontinence, Disp: 150 each, Rfl: 11 .  insulin aspart (NOVOLOG) 100 UNIT/ML injection, Inject 4-16 Units into the skin 3 (three) times daily with meals. Per Sliding scale CBG 100-150: 4 units, 151-200: 6 units, 201-250: 8 units, 251-300: 10 units, 301-350: 12 units, 351-400: 14 units, > 401; call doctor, Disp: , Rfl:  .  LANTUS SOLOSTAR 100 UNIT/ML Solostar Pen, Inject 10 Units into the skin 2 (two) times daily. , Disp: , Rfl: 3 .  methotrexate (RHEUMATREX) 2.5 MG tablet, Take 10 mg by mouth every Wednesday., Disp: , Rfl:  .  metoprolol tartrate (LOPRESSOR) 25 MG tablet, TAKE 1 TABLET BY MOUTH TWICE A DAY, Disp: 60 tablet, Rfl: 11 .  nitroGLYCERIN (NITROSTAT) 0.4 MG SL tablet, Place 1 tablet (0.4 mg total) under the tongue every 5 (five) minutes as needed for chest pain. Max 3 doses, call 911., Disp: 25 tablet, Rfl: 7 .  ondansetron (ZOFRAN) 4 MG tablet, TAKE 1 TABLET BY MOUTH EVERY 6 HOURS., Disp:  12 tablet, Rfl: 0 .  pantoprazole (PROTONIX) 40 MG tablet, Take 40 mg by mouth daily., Disp: , Rfl:  .  prednisoLONE acetate (PRED FORTE) 1 % ophthalmic suspension, Place 4-5 drops into the left eye daily., Disp: , Rfl: 6 .  rosuvastatin (CRESTOR) 40 MG tablet, Take 1 tablet (40 mg total) by mouth daily., Disp: 90 tablet, Rfl: 3 .  sertraline (ZOLOFT) 100 MG tablet, Take 150 mg by mouth daily., Disp: , Rfl:  .  terbinafine (LAMISIL) 250 MG tablet, Take 1 tablet (250 mg total) by mouth daily., Disp: 42 tablet, Rfl: 0 .  terbinafine (LAMISIL) 250 MG tablet, Take 1 tablet (250 mg total) by mouth daily., Disp: 28  tablet, Rfl: 0 .  traMADol (ULTRAM) 50 MG tablet, TAKE 1 TABLET EVERY 6 HOURS AS NEEDED FOR MODERATE PAIN, Disp: 90 tablet, Rfl: 2  Past Medical History: Past Medical History:  Diagnosis Date  . Acute myocardial infarction of other lateral wall, initial episode of care   . Acute myocardial infarction, unspecified site, initial episode of care   . Acute osteomyelitis   . Allergic rhinitis   . Anemia   . Anginal pain ([place])    07/15/13- no chest pain in months"  . Anxiety   . Bipolar affective ([place])   . CAD (coronary artery disease) 07/2011   s/p DES mid and distal RCA with 50% LAD  . CKD stage 1 due to type 2 diabetes mellitus ([place])   . Daily headache    not daily  . Depression    Bipolar disorder  . Diabetic foot ulcer associated with diabetes mellitus due to underlying condition ([place]) 09/26/2018  . Diabetic gastroparesis associated with type 2 diabetes mellitus ([place])   . Diabetic peripheral neuropathy associated with type 2 diabetes mellitus ([place])   . Diabetic retinopathy   . Esophageal stenosis   . Esophageal ulcer   . Esophagitis   . Gastroparesis   . Genital herpes    Reportedly tested and documented by [facility]--rare occurrences  . GERD (gastroesophageal reflux disease)   . Heart murmur   . History of stomach ulcers   . Hyperlipidemia   . Hypertension   . 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 Migraines   . Orthopnoea   . Pneumonia 2012  . Polysubstance abuse ([place])    Crack cocaine--none since 2008, MJ, ETOH:  clean of all since 2008  . Renal insufficiency   . Renal lithiasis    Bilateral  . Rheumatoid arthritis(714.0)   . Sebaceous cyst   . Stroke [facility]) 2011   denies residual on 12/19/2012.  "Years ago"  . Type II diabetes mellitus (H[facility])    Previously uncontrolled for many years with multiple complications.  2017 controlled. 05/2016:  6.7%    Tobacco Use: Social History   Tobacco Use  Smoking  Status Current Every Day Smoker  . Packs/day: 0.50  . Years: 22.00  . Pack years: 11.00  . Types: Cigarettes  Smokeless Tobacco Never Used  Tobacco Comment   Has been to classes--not sure if she wants to quit.  Changing to non menthol..  Chantix, patches, gum never helped.  9/19:  lot going on.    Labs: Recent Review Flowsheet Data    Labs for ITP Cardiac and Pulmonary Rehab Latest Ref Rng & Units 01/24/2019 01/30/2019 04/15/2019 09/08/2019 08/12/2020   Cholestrol 100 - 199 mg/dL 166 172 - 149 -  LDLCALC 0 - 99 mg/dL 69 66 - 66 -   HDL >39 mg/dL 82 89 - 70 -   Trlycerides 0 - 149 mg/dL 74 87 - 66 -   Hemoglobin A1c 4.8 - 5.6 % - - 7.0(H) 6.2(H) 6.7(H)   PHART 7.350 - 7.450 - - - - -   PCO2ART 35.0 - 45.0 mmHg - - - - -   HCO3 20.0 - 24.0 mEq/L - - - - -   TCO2 0 - 100 mmol/L - - - - -   ACIDBASEDEF 0.0 - 2.0 mmol/L - - - - -   O2SAT % - - - - -      Capillary Blood Glucose: Lab Results  Component Value Date   GLUCAP 285 (H) 12/10/2020   GLUCAP 111 (H) 11/12/2020   GLUCAP 86 11/12/2020   GLUCAP 61 (L) 11/12/2020   GLUCAP 66 (L) 11/12/2020     Exercise Target Goals: Exercise Program Goal: Individual exercise prescription set using results from initial 6 min walk test and THRR while considering  patient's activity barriers and safety.   Exercise Prescription Goal: Starting with aerobic activity 30 plus minutes a day, 3 days per week for initial exercise prescription. Provide home exercise prescription and guidelines that participant acknowledges understanding prior to discharge.  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 HR 91 bpm     Max Ex. BP 124/70     2 Minute Post BP 120/64            Oxygen Initial Assessment:   Oxygen Re-Evaluation:   Oxygen Discharge (Final Oxygen Re-Evaluation):   Initial Exercise Prescription:  Initial Exercise Prescription - 10/26/20 1300      Date of Initial Exercise RX and Referring Provider   Date 10/26/20    Referring Provider Larae Grooms, MD    Expected Discharge Date 12/24/20      Prescription Details   Frequency (times per week) 3    Duration Progress to 30 minutes of continuous aerobic without signs/symptoms of physical distress      Intensity   THRR 40-80% of Max Heartrate 67-134    Ratings of Perceived Exertion 11-13    Perceived Dyspnea 0-4      Progression   Progression Continue progressive overload as per policy without signs/symptoms or physical distress.      Resistance Training   Training Prescription Yes  Weight 2 lbs    Reps 10-15           Perform Capillary Blood Glucose checks as needed.  Exercise Prescription Changes:  Exercise Prescription Changes    Row Name 11/03/20 1600 11/17/20 1538 12/08/20 1500         Response to Exercise   Blood Pressure (Admit) 106/54 110/52 110/72     Blood Pressure (Exercise) 104/70 120/70 118/80     Blood Pressure (Exit) 110/58 104/70 124/78     Heart Rate (Admit) 86 bpm 87 bpm 81 bpm     Heart Rate (Exercise) 92 bpm 94 bpm 92 bpm     Heart Rate (Exit) 83 bpm 84 bpm 76 bpm     Rating of Perceived Exertion (Exercise) '11 11 11     ' Symptoms None None None     Comments Pt's first day of exercise in the CRP2 program Reviewed METs Reviewed METs, Goals, and  home exercise Rx     Duration Continue with 30 min of aerobic exercise without signs/symptoms of physical distress. Continue with 30 min of aerobic exercise without signs/symptoms of physical distress. Continue with 30 min of aerobic exercise without signs/symptoms of physical distress.     Intensity THRR unchanged THRR unchanged THRR unchanged           Progression   Progression Continue to progress workloads to maintain intensity without signs/symptoms of physical distress. Continue to progress workloads to maintain intensity without signs/symptoms of physical distress. Continue to progress workloads to maintain intensity without signs/symptoms of physical distress.     Average METs 2.4 2.1 2.2           Resistance Training   Training Prescription No No No     Weight No weights on Wednesdays No weights on Wednesdays No weights on Wednesdays           Interval Training   Interval Training No No No           NuStep   Level '2 2 3     ' SPM 75 75 80     Minutes '30 30 30     ' METs 2.4 2.1 2.2           Home Exercise Plan   Plans to continue exercise at -- -- Home (comment)     Frequency -- -- Add 3 additional days to program exercise sessions.     Initial Home Exercises Provided -- -- 12/08/20            Exercise Comments:  Exercise Comments    Row Name 11/03/20 1626 11/17/20 1541 12/08/20 1500       Exercise Comments Pt's first day in the [place] [place] program. Pt tolerated session well. Reviewed METs today. Pt progress is slow due to several issues regarding her blood sugar control. The patient has missed several vists but has seen her endocrinologist and is cleared to resume the CRP2 program. Reviewed METs, Goals and home exercise Rx. Pt voices she has begun walking daily now that the weahter has improved. She takes her dogs and goes to a track near her home and walks for 30-60 minutes. Pt verbalized understanding of the home exercise Rx and was provided a copy.            Exercise  Goals and Review:  Exercise Goals    Row Name 10/26/20 1346             Exercise Goals   Increase Physical Activity Yes  Intervention Provide advice, education, support and counseling about physical activity/exercise needs.;Develop an individualized exercise prescription for aerobic and resistive training based on initial evaluation findings, risk stratification, comorbidities and participant's personal goals.       Expected Outcomes Short Term: Attend rehab on a regular basis to increase amount of physical activity.;Long Term: Add in home exercise to make exercise part of routine and to increase amount of physical activity.;Long Term: Exercising regularly at least 3-5 days a week.       Increase Strength and Stamina Yes       Intervention Provide advice, education, support and counseling about physical activity/exercise needs.;Develop an individualized exercise prescription for aerobic and resistive training based on initial evaluation findings, risk stratification, comorbidities and participant's personal goals.       Expected Outcomes Short Term: Increase workloads from initial exercise prescription for resistance, speed, and METs.;Short Term: Perform resistance training exercises routinely during rehab and add in resistance training at home;Long Term: Improve cardiorespiratory fitness, muscular endurance and strength as measured by increased METs and functional capacity (6MWT)       Able to understand and use rate of perceived exertion (RPE) scale Yes       Intervention Provide education and explanation on how to use RPE scale       Expected Outcomes Short Term: Able to use RPE daily in rehab to express subjective intensity level;Long Term:  Able to use RPE to guide intensity level when exercising independently       Knowledge and understanding of Target Heart Rate Range (THRR) Yes       Intervention Provide education and explanation of THRR including how the numbers were predicted and  where they are located for reference       Expected Outcomes Short Term: Able to use daily as guideline for intensity in rehab;Long Term: Able to use THRR to govern intensity when exercising independently;Short Term: Able to state/look up THRR       Understanding of Exercise Prescription Yes       Intervention Provide education, explanation, and written materials on patient's individual exercise prescription       Expected Outcomes Short Term: Able to explain program exercise prescription;Long Term: Able to explain home exercise prescription to exercise independently              Exercise Goals Re-Evaluation :  Exercise Goals Re-Evaluation    Row Name 11/03/20 1624 12/08/20 1500 12/09/20 0821         Exercise Goal Re-Evaluation   Exercise Goals Review Increase Physical Activity;Increase Strength and Stamina;Able to understand and use rate of perceived exertion (RPE) scale;Knowledge and understanding of Target Heart Rate Range (THRR);Understanding of Exercise Prescription Increase Physical Activity;Increase Strength and Stamina;Able to understand and use rate of perceived exertion (RPE) scale;Knowledge and understanding of Target Heart Rate Range (THRR);Understanding of Exercise Prescription --     Comments Pt's first day of exercise in the CRP2 program. Pt understands the Exercise Rx, THRR, RPE scale. Reviewed METs, Goals, and home exercise Rx. Pt is making progress in the CRP2 program and was able to increase to level 3 on nustep today Pt has had issues with her blood sugars and continues to work with her physican and the dietician on this issue. --     Expected Outcomes Will continue to monitor patient and increase exercise workloads as tolerated. Will continue to monitor patient and increase exercise workloads as tolerated. --  Discharge Exercise Prescription (Final Exercise Prescription Changes):  Exercise Prescription Changes - 12/08/20 1500      Response to Exercise    Blood Pressure (Admit) 110/72    Blood Pressure (Exercise) 118/80    Blood Pressure (Exit) 124/78    Heart Rate (Admit) 81 bpm    Heart Rate (Exercise) 92 bpm    Heart Rate (Exit) 76 bpm    Rating of Perceived Exertion (Exercise) 11    Symptoms None    Comments Reviewed METs, Goals, and home exercise Rx    Duration Continue with 30 min of aerobic exercise without signs/symptoms of physical distress.    Intensity THRR unchanged      Progression   Progression Continue to progress workloads to maintain intensity without signs/symptoms of physical distress.    Average METs 2.2      Resistance Training   Training Prescription No    Weight No weights on Wednesdays      Interval Training   Interval Training No      NuStep   Level 3    SPM 80    Minutes 30    METs 2.2      Home Exercise Plan   Plans to continue exercise at Home (comment)    Frequency Add 3 additional days to program exercise sessions.    Initial Home Exercises Provided 12/08/20           Nutrition:  Target Goals: Understanding of nutrition guidelines, daily intake of sodium <1510m, cholesterol <2023m calories 30% from fat and 7% or less from saturated fats, daily to have 5 or more servings of fruits and vegetables.  Biometrics:  Pre Biometrics - 10/26/20 1327      Pre Biometrics   Waist Circumference 41 inches    Hip Circumference 45 inches    Waist to Hip Ratio 0.91 %    Triceps Skinfold 31 mm    % Body Fat 42.6 %    Grip Strength 25 kg    Flexibility 14.5 in    Single Leg Stand 4.75 seconds            Nutrition Therapy Plan and Nutrition Goals:  Nutrition Therapy & Goals - 11/19/20 1355      Nutrition Therapy   Diet TLC; carb modified      Personal Nutrition Goals   Nutrition Goal Pt to build a healthy plate including vegetables, fruits, whole grains, and low-fat dairy products in a heart healthy meal plan.    Personal Goal #2 Pt to reduce time spent in hypoglycemia <1% per CGM     Personal Goal #3 CBG concentrations in the normal range or as close to normal as is safely possible.      Intervention Plan   Intervention Prescribe, educate and counsel regarding individualized specific dietary modifications aiming towards targeted core components such as weight, hypertension, lipid management, diabetes, heart failure and other comorbidities.;Nutrition handout(s) given to patient.    Expected Outcomes Short Term Goal: A plan has been developed with personal nutrition goals set during dietitian appointment.;Long Term Goal: Adherence to prescribed nutrition plan.           Nutrition Assessments:  MEDIFICTS Score Key:  ?70 Need to make dietary changes   40-70 Heart Healthy Diet  ? 40 Therapeutic Level Cholesterol Diet  Flowsheet Row CARDIAC REHAB PHASE II EXERCISE [facility] Total Score on Admission 49     Picture Your  Plate Scores:  <35 Unhealthy dietary pattern with much room for improvement.  41-50 Dietary pattern unlikely to meet recommendations for good health and room for improvement.  51-60 More healthful dietary pattern, with some room for improvement.   >60 Healthy dietary pattern, although there may be some specific behaviors that could be improved.    Nutrition Goals Re-Evaluation:  Nutrition Goals Re-Evaluation    [place] Name 11/19/20 1400 12/20/20 1511           Goals   Current Weight 171 lb 1.2 oz (77.6 kg) 168 lb 6.9 oz (76.4 kg)      Nutrition Goal Pt to build a healthy plate including vegetables, fruits, whole grains, and low-fat dairy products in a heart healthy meal plan. Pt to build a healthy plate including vegetables, fruits, whole grains, and low-fat dairy products in a heart healthy meal plan.             Personal Goal #2 Re-Evaluation   Personal Goal #2 Pt to reduce time spent in hypoglycemia <1% per CGM Pt to reduce time spent in hypoglycemia <1% per CGM              Personal Goal #3 Re-Evaluation   Personal Goal #3 CBG concentrations in the normal range or as close to normal as is safely possible. CBG concentrations in the normal range or as close to normal as is safely possible.             Nutrition Goals Discharge (Final Nutrition Goals Re-Evaluation):  Nutrition Goals Re-Evaluation - 12/20/20 1511      Goals   Current Weight 168 lb 6.9 oz (76.4 kg)    Nutrition Goal Pt to build a healthy plate including vegetables, fruits, whole grains, and low-fat dairy products in a heart healthy meal plan.      Personal Goal #2 Re-Evaluation   Personal Goal #2 Pt to reduce time spent in hypoglycemia <1% per CGM      Personal Goal #3 Re-Evaluation   Personal Goal #3 CBG concentrations in the normal range or as close to normal as is safely possible.           Psychosocial: Target Goals: Acknowledge presence or absence of significant depression and/or stress, maximize coping skills, provide positive support system. Participant is able to verbalize types and ability to use techniques and skills needed for reducing stress and depression.  Initial Review & Psychosocial Screening:  Initial Psych Review & Screening - 10/26/20 1440      Initial Review   Current issues with History of Depression      Family Dynamics   Good Support System? Yes   Kayona has her children and friends who live [place] for support. Tynisa is taking care of a 54 year old currently     Barriers   Psychosocial barriers to participate in program The patient should benefit from training in stress management and relaxation.      Screening Interventions   Interventions Encouraged to exercise    Expected Outcomes Long Term Goal: Stressors or current issues are controlled or eliminated.           Quality of Life Scores:  Quality of Life - 10/26/20 1331      Quality of Life   Select Quality of Life      Quality of Life Scores   Health/Function Pre 21.97 %    Socioeconomic  Pre 24.64 %    Psych/Spiritual Pre 28.93 %  Family Pre 25.5 %    GLOBAL Pre 24.44 %          Scores of 19 and below usually indicate a poorer quality of life in these areas.  A difference of  2-3 points is a clinically meaningful difference.  A difference of 2-3 points in the total score of the Quality of Life Index has been associated with significant improvement in overall quality of life, self-image, physical symptoms, and general health in studies assessing change in quality of life.  PHQ-9: Recent Review Flowsheet Data    Depression screen [facility] 2/9 10/26/2020 11/21/2019   Decreased Interest 0 0   Down, Depressed, Hopeless 0 0   PHQ - 2 Score 0 0   Altered sleeping - 0   Tired, decreased energy - 0   Change in appetite - 0   Feeling bad or failure about yourself  - 0   Trouble concentrating - 0   Moving slowly or fidgety/restless - 0   Suicidal thoughts - 0   PHQ-9 Score - 0   Difficult doing work/chores - Not difficult at all     Interpretation of Total Score  Total Score Depression Severity:  1-4 = Minimal depression, 5-9 = Mild depression, 10-14 = Moderate depression, 15-19 = Moderately severe depression, 20-27 = Severe depression   Psychosocial Evaluation and Intervention:   Psychosocial Re-Evaluation:  Psychosocial Re-Evaluation    Row Name 11/23/20 1537 12/21/20 1524           Psychosocial Re-Evaluation   Current issues with History of Depression History of Depression      Comments Reha has not voiced any increased stressors since participating in program. Nollie continues to care for a friends young daughter Valentina voiced concerns about taking care of her friends daughter as social services may become involved. Emotional support provided      Expected Outcomes Enora will have decreased stressors upon completion of phase 2 cardiac rehab Timberlynn will have decreased stressors upon completion of phase 2 cardiac rehab if possible      Interventions Stress management  education;Encouraged to attend Cardiac Rehabilitation for the exercise Stress management education;Encouraged to attend Cardiac Rehabilitation for the exercise      Continue Psychosocial Services  Follow up required by staff Follow up required by staff             Psychosocial Discharge (Final Psychosocial Re-Evaluation):  Psychosocial Re-Evaluation - 12/21/20 1524      Psychosocial Re-Evaluation   Current issues with History of Depression    Comments Joselynne voiced concerns about taking care of her friends daughter as social services may become involved. Emotional support provided    Expected Outcomes Nocole will have decreased stressors upon completion of phase 2 cardiac rehab if possible    Interventions Stress management education;Encouraged to attend Cardiac Rehabilitation for the exercise    Continue Psychosocial Services  Follow up required by staff           Vocational Rehabilitation: Provide vocational rehab assistance to qualifying candidates.   Vocational Rehab Evaluation & Intervention:  Vocational Rehab - 10/26/20 1442      Initial Vocational Rehab Evaluation & Intervention   Assessment shows need for Vocational Rehabilitation No   Daishia is currently on disability and does not need vocational rehab at this time          Education: Education Goals: Education classes will be provided on a weekly basis, covering required topics. Participant will state understanding/return demonstration of topics  presented.  Learning Barriers/Preferences:  Learning Barriers/Preferences - 10/26/20 1332      Learning Barriers/Preferences   Learning Barriers Sight   Blind in left eye   Learning Preferences Verbal Instruction;Computer/Internet;Audio           Education Topics: Hypertension, Hypertension Reduction -Define heart disease and Marchetta blood pressure. Discus how Bahner blood pressure affects the body and ways to reduce [place] blood pressure.   Exercise and Your Heart -Discuss why  it is important to exercise, the FITT principles of exercise, normal and abnormal responses to exercise, and how to exercise safely.   Angina -Discuss definition of angina, causes of angina, treatment of angina, and how to decrease risk of having angina.   Cardiac Medications -Review what the following cardiac medications are used for, how they affect the body, and side effects that may occur when taking the medications.  Medications include Aspirin, Beta blockers, calcium channel blockers, ACE Inhibitors, angiotensin receptor blockers, diuretics, digoxin, and antihyperlipidemics.   Congestive Heart Failure -Discuss the definition of CHF, how to live with CHF, the signs and symptoms of CHF, and how keep track of weight and sodium intake.   Heart Disease and Intimacy -Discus the effect sexual activity has on the heart, how changes occur during intimacy as we age, and safety during sexual activity.   Smoking Cessation / COPD -Discuss different methods to quit smoking, the health benefits of quitting smoking, and the definition of COPD.   Nutrition I: Fats -Discuss the types of cholesterol, what cholesterol does to the heart, and how cholesterol levels can be controlled.   Nutrition II: Labels -Discuss the different components of food labels and how to read food label   Heart Parts/Heart Disease and PAD -Discuss the anatomy of the heart, the pathway of blood circulation through the heart, and these are affected by heart disease.   Stress I: Signs and Symptoms -Discuss the causes of stress, how stress may lead to anxiety and depression, and ways to limit stress.   Stress II: Relaxation -Discuss different types of relaxation techniques to limit stress.   Warning Signs of Stroke / TIA -Discuss definition of a stroke, what the signs and symptoms are of a stroke, and how to identify when someone is having stroke.   Knowledge Questionnaire Score:  Knowledge Questionnaire Score  - 10/26/20 1333      Knowledge Questionnaire Score   Pre Score 24/28           Core Components/Risk Factors/Patient Goals at Admission:  Personal Goals and Risk Factors at Admission - 10/26/20 1333      Core Components/Risk Factors/Patient Goals on Admission    Weight Management Yes;Weight Loss    Intervention Weight Management: Develop a combined nutrition and exercise program designed to reach desired caloric intake, while maintaining appropriate intake of nutrient and fiber, sodium and fats, and appropriate energy expenditure required for the weight goal.;Weight Management: Provide education and appropriate resources to help participant work on and attain dietary goals.;Weight Management/Obesity: Establish reasonable short term and long term weight goals.    Admit Weight 167 lb 1.7 oz (75.8 kg)    Expected Outcomes Understanding of distribution of calorie intake throughout the day with the consumption of 4-5 meals/snacks;Weight Loss: Understanding of general recommendations for a balanced deficit meal plan, which promotes 1-2 lb weight loss per week and includes a negative energy balance of [phone] kcal/d;Understanding recommendations for meals to include 15-35% energy as protein, 25-35% energy from fat, 35-60% energy from carbohydrates, less  than 245m of dietary cholesterol, 20-35 gm of total fiber daily;Weight Maintenance: Understanding of the daily nutrition guidelines, which includes 25-35% calories from fat, 7% or less cal from saturated fats, less than 2075mcholesterol, less than 1.5gm of sodium, & 5 or more servings of fruits and vegetables daily;Long Term: Adherence to nutrition and physical activity/exercise program aimed toward attainment of established weight goal;Short Term: Continue to assess and modify interventions until short term weight is achieved    Tobacco Cessation Yes    Number of packs per day 2    Intervention Assist the participant in steps to quit. Provide  individualized education and counseling about committing to Tobacco Cessation, relapse prevention, and pharmacological support that can be provided by physician.;OfAdvice workerassist with locating and accessing local/national Quit Smoking programs, and support quit date choice.    Expected Outcomes Short Term: Will demonstrate readiness to quit, by selecting a quit date.;Short Term: Will quit all tobacco product use, adhering to prevention of relapse plan.;Long Term: Complete abstinence from all tobacco products for at least 12 months from quit date.    Diabetes Yes    Intervention Provide education about signs/symptoms and action to take for hypo/hyperglycemia.;Provide education about proper nutrition, including hydration, and aerobic/resistive exercise prescription along with prescribed medications to achieve blood glucose in normal ranges: Fasting glucose 65-99 mg/dL    Expected Outcomes Short Term: Participant verbalizes understanding of the signs/symptoms and immediate care of hyper/hypoglycemia, proper foot care and importance of medication, aerobic/resistive exercise and nutrition plan for blood glucose control.;Long Term: Attainment of HbA1C < 7%.    Hypertension Yes    Intervention Provide education on lifestyle modifcations including regular physical activity/exercise, weight management, moderate sodium restriction and increased consumption of fresh fruit, vegetables, and low fat dairy, alcohol moderation, and smoking cessation.;Monitor prescription use compliance.    Expected Outcomes Short Term: Continued assessment and intervention until BP is < 140/9020mG in hypertensive participants. < 130/39m20m in hypertensive participants with diabetes, heart failure or chronic kidney disease.;Long Term: Maintenance of blood pressure at goal levels.    Lipids Yes    Intervention Provide education and support for participant on nutrition & aerobic/resistive exercise along with prescribed  medications to achieve LDL <70mg31mL >40mg.22mExpected Outcomes Short Term: Participant states understanding of desired cholesterol values and is compliant with medications prescribed. Participant is following exercise prescription and nutrition guidelines.;Long Term: Cholesterol controlled with medications as prescribed, with individualized exercise RX and with personalized nutrition plan. Value goals: LDL < 70mg, 45m> 40 mg.           Core Components/Risk Factors/Patient Goals Review:   Goals and Risk Factor Review    Row Name 11/23/20 1545 12/21/20 1526           Core Components/Risk Factors/Patient Goals Review   Personal Goals Review Weight Management/Obesity;Hypertension;Tobacco Cessation;Lipids;Diabetes Weight Management/Obesity;Hypertension;Tobacco Cessation;Lipids;Diabetes      Review Shannara has been doing well with exercise. CBG's have been improved since insulin dose was decreased by Dr Kerr. CBuddy Dutynue to encourage smoking cessation Sofiah has been doing well with exercise. Deirdra's CBG's have varied between being too [place] or too low. Encouraged the patient to do a better job of monitoring her CBG's . Continue to encourage smoking cessation.      Expected Outcomes Laquitha will continue to participate in phase 2 cardiac rehab for exercie nutrtion and lifestyle modifications Noga will continue to participate in phase 2 cardiac rehab for exercie nutrtion and lifestyle  modifications             Core Components/Risk Factors/Patient Goals at Discharge (Final Review):   Goals and Risk Factor Review - 12/21/20 1526      Core Components/Risk Factors/Patient Goals Review   Personal Goals Review Weight Management/Obesity;Hypertension;Tobacco Cessation;Lipids;Diabetes    Review Moranda has been doing well with exercise. Jasnoor's CBG's have varied between being too [place]ojcicki or too low. Encouraged the patient to do a better job of monitoring her CBG's . Continue to encourage smoking cessation.    Expected  Outcomes Twyla will continue to participate in phase 2 cardiac rehab for exercie nutrtion and lifestyle modifications           ITP Comments:  ITP Comments    Row Name 10/26/20 1203 11/23/20 1536 12/21/20 1523       ITP Comments Dr Fransico Him MD, Medical Director 30 Day ITP Review. Cola has good particiation and fair attendance in phase 2 cardiac rehab. 30 Day ITP Review. Imo has good particiation and fair attendance in phase 2 cardiac rehab. Donne continues to have issues with uncontrolled CBG's            Comments: See ITP comments.Barnet Pall, RN,BSN 12/21/2020 3:30 PM

## 2020-12-22 ENCOUNTER — Encounter (HOSPITAL_COMMUNITY)
Admission: RE | Admit: 2020-12-22 | Discharge: 2020-12-22 | Disposition: A | Discharge status: Home / Self Care | Payer: Medicaid Other | Source: Ambulatory Visit | Attending: Interventional Cardiology | Admitting: Interventional Cardiology

## 2020-12-22 ENCOUNTER — Other Ambulatory Visit: Payer: Self-pay

## 2020-12-22 DIAGNOSIS — Z955 Presence of coronary angioplasty implant and graft: Secondary | ICD-10-CM

## 2020-12-22 NOTE — Progress Notes (Signed)
Incomplete Session Note  Patient Details  Name: Leeona Mccardle MRN: [id number] Date of Birth: June 26, 1966 Referring Provider:   Flowsheet Row CARDIAC REHAB PHASE II ORIENTATION from 10/26/2020 in [facility] CARDIAC REHAB  Referring Provider Lance Muss, MD      Nirvi Zacharia did not complete her rehab session.  Lorinda checked her CBG via her CGM it was 295. Patient ate lunch and took 8 units of insulin. CBG 316 via CGM. No exercise per protocol. Patient has water to drink and is asymptomatic Marisal checked her CGM before leaving cardiac rehab.CBG was 308. Rogenia was reminded not to come to cardiac rehab if her CBG is greater than 300 or less than 110. Patient says that she understands the instructions.Gladstone Lighter, RN,BSN 12/22/2020 1:40 PM

## 2020-12-24 ENCOUNTER — Other Ambulatory Visit: Payer: Self-pay

## 2020-12-24 ENCOUNTER — Telehealth (INDEPENDENT_AMBULATORY_CARE_PROVIDER_SITE_OTHER): Payer: Medicaid Other | Admitting: Clinical

## 2020-12-24 ENCOUNTER — Encounter (HOSPITAL_COMMUNITY)
Admission: RE | Admit: 2020-12-24 | Discharge: 2020-12-24 | Disposition: A | Discharge status: Home / Self Care | Payer: Medicaid Other | Source: Ambulatory Visit | Attending: Interventional Cardiology | Admitting: Interventional Cardiology

## 2020-12-24 VITALS — Ht 62.0 in | Wt 171.7 lb

## 2020-12-24 DIAGNOSIS — Z955 Presence of coronary angioplasty implant and graft: Secondary | ICD-10-CM | POA: Diagnosis not present

## 2020-12-24 DIAGNOSIS — F609 Personality disorder, unspecified: Secondary | ICD-10-CM | POA: Diagnosis not present

## 2020-12-24 NOTE — Progress Notes (Signed)
Discharge Progress Report  Patient Details  Name: Thyra Yinger MRN: [id number] Date of Birth: 1965-11-11 Referring Provider:   Flowsheet Row CARDIAC REHAB PHASE II ORIENTATION from 10/26/2020 in [place]  Referring Provider Larae Grooms, MD        Number of Visits: 14  Reason for Discharge:  Patient reached a stable level of exercise. Patient independent in their exercise. Patient has met program and personal goals.  Smoking History:  Social History   Tobacco Use  Smoking Status Every Day   Packs/day: 0.50   Years: 22.00   Pack years: 11.00   Types: Cigarettes  Smokeless Tobacco Never  Tobacco Comments   Has been to classes--not sure if she wants to quit.  Changing to non menthol..  Chantix, patches, gum never helped.  9/19:  lot going on.    Diagnosis:  08/13/20 S/P DES LAD  ADL [facility]:   Initial Exercise Prescription:  Initial Exercise Prescription - 10/26/20 1300       Date of Initial Exercise RX and Referring Provider   Date 10/26/20    Referring Provider Larae Grooms, MD    Expected Discharge Date 12/24/20      Prescription Details   Frequency (times per week) 3    Duration Progress to 30 minutes of continuous aerobic without signs/symptoms of physical distress      Intensity   THRR 40-80% of Max Heartrate 67-134    Ratings of Perceived Exertion 11-13    Perceived Dyspnea 0-4      Progression   Progression Continue progressive overload as per policy without signs/symptoms or physical distress.      Resistance Training   Training Prescription Yes    Weight 2 lbs    Reps 10-15             Discharge Exercise Prescription (Final Exercise Prescription Changes):  Exercise Prescription Changes - 12/08/20 1500       Response to Exercise   Blood Pressure (Admit) 110/72    Blood Pressure (Exercise) 118/80    Blood Pressure (Exit) 124/78    Heart Rate (Admit) 81 bpm    Heart Rate (Exercise) 92 bpm    Heart  Rate (Exit) 76 bpm    Rating of Perceived Exertion (Exercise) 11    Symptoms None    Comments Reviewed METs, Goals, and home exercise Rx    Duration Continue with 30 min of aerobic exercise without signs/symptoms of physical distress.    Intensity THRR unchanged      Progression   Progression Continue to progress workloads to maintain intensity without signs/symptoms of physical distress.    Average METs 2.2      Resistance Training   Training Prescription No    Weight No weights on Wednesdays      Interval Training   Interval Training No      NuStep   Level 3    SPM 80    Minutes 30    METs 2.2      Home Exercise Plan   Plans to continue exercise at Home (comment)    Frequency Add 3 additional days to program exercise sessions.    Initial Home Exercises Provided 12/08/20             Functional Capacity:  6 Minute Walk     Row Name 10/26/20 1338 12/24/20 1330       6 Minute Walk   Phase Initial Discharge    Distance 1200 feet 1221  feet    Distance % Change -- 1.75 %    Distance Feet Change -- 21 ft    Walk Time 6 minutes 6 minutes    # of Rest Breaks 0 0    MPH 2.27 2.3    METS 3.21 3.43    RPE 10 10    Perceived Dyspnea  0 0    VO2 Peak 11.25 12    Symptoms No No    Resting HR 79 bpm 96 bpm    Resting BP 98/72 114/58    Resting Oxygen Saturation  100 % 98 %    Exercise Oxygen Saturation  during 6 min walk 94 % 98 %    Max Ex. HR 91 bpm 111 bpm    Max Ex. BP 124/70 128/70    2 Minute Post BP 120/64 --             Psychological, QOL, Others - Outcomes: PHQ 2/9: Depression screen [name] 2/9 12/24/2020 10/26/2020 11/21/2019  Decreased Interest 0 0 0  Down, Depressed, Hopeless 0 0 0  PHQ - 2 Score 0 0 0  Altered sleeping - - 0  Tired, decreased energy - - 0  Change in appetite - - 0  Feeling bad or failure about yourself  - - 0  Trouble concentrating - - 0  Moving slowly or fidgety/restless - - 0  Suicidal thoughts - - 0  PHQ-9 Score - - 0   Difficult doing work/chores - - Not difficult at all  Some recent data might be hidden    Quality of Life:  Quality of Life - 01/03/21 0839       Quality of Life   Select Quality of Life      Quality of Life Scores   Health/Function Post 14.07 %    Socioeconomic Post 16.14 %    Psych/Spiritual Post 8.14 %    Family Post 8.8 %    GLOBAL Post 13.82 %             Personal Goals: Goals established at orientation with interventions provided to work toward goal.  Personal Goals and Risk Factors at Admission - 10/26/20 1333       Core Components/Risk Factors/Patient Goals on Admission    Weight Management Yes;Weight Loss    Intervention Weight Management: Develop a combined nutrition and exercise program designed to reach desired caloric intake, while maintaining appropriate intake of nutrient and fiber, sodium and fats, and appropriate energy expenditure required for the weight goal.;Weight Management: Provide education and appropriate resources to help participant work on and attain dietary goals.;Weight Management/Obesity: Establish reasonable short term and long term weight goals.    Admit Weight 167 lb 1.7 oz (75.8 kg)    Expected Outcomes Understanding of distribution of calorie intake throughout the day with the consumption of 4-5 meals/snacks;Weight Loss: Understanding of general recommendations for a balanced deficit meal plan, which promotes 1-2 lb weight loss per week and includes a negative energy balance of [phone] kcal/d;Understanding recommendations for meals to include 15-35% energy as protein, 25-35% energy from fat, 35-60% energy from carbohydrates, less than 283m of dietary cholesterol, 20-35 gm of total fiber daily;Weight Maintenance: Understanding of the daily nutrition guidelines, which includes 25-35% calories from fat, 7% or less cal from saturated fats, less than 2010mcholesterol, less than 1.5gm of sodium, & 5 or more servings of fruits and vegetables  daily;Long Term: Adherence to nutrition and physical activity/exercise program aimed toward attainment of established weight goal;Short  Term: Continue to assess and modify interventions until short term weight is achieved    Tobacco Cessation Yes    Number of packs per day 2    Intervention Assist the participant in steps to quit. Provide individualized education and counseling about committing to Tobacco Cessation, relapse prevention, and pharmacological support that can be provided by physician.;Advice worker, assist with locating and accessing local/national Quit Smoking programs, and support quit date choice.    Expected Outcomes Short Term: Will demonstrate readiness to quit, by selecting a quit date.;Short Term: Will quit all tobacco product use, adhering to prevention of relapse plan.;Long Term: Complete abstinence from all tobacco products for at least 12 months from quit date.    Diabetes Yes    Intervention Provide education about signs/symptoms and action to take for hypo/hyperglycemia.;Provide education about proper nutrition, including hydration, and aerobic/resistive exercise prescription along with prescribed medications to achieve blood glucose in normal ranges: Fasting glucose 65-99 mg/dL    Expected Outcomes Short Term: Participant verbalizes understanding of the signs/symptoms and immediate care of hyper/hypoglycemia, proper foot care and importance of medication, aerobic/resistive exercise and nutrition plan for blood glucose control.;Long Term: Attainment of HbA1C < 7%.    Hypertension Yes    Intervention Provide education on lifestyle modifcations including regular physical activity/exercise, weight management, moderate sodium restriction and increased consumption of fresh fruit, vegetables, and low fat dairy, alcohol moderation, and smoking cessation.;Monitor prescription use compliance.    Expected Outcomes Short Term: Continued assessment and intervention until BP  is < 140/71m HG in hypertensive participants. < 130/836mHG in hypertensive participants with diabetes, heart failure or chronic kidney disease.;Long Term: Maintenance of blood pressure at goal levels.    Lipids Yes    Intervention Provide education and support for participant on nutrition & aerobic/resistive exercise along with prescribed medications to achieve LDL <7079mHDL >36m73m  Expected Outcomes Short Term: Participant states understanding of desired cholesterol values and is compliant with medications prescribed. Participant is following exercise prescription and nutrition guidelines.;Long Term: Cholesterol controlled with medications as prescribed, with individualized exercise RX and with personalized nutrition plan. Value goals: LDL < 70mg48mL > 40 mg.              Personal Goals Discharge:  Goals and Risk Factor Review     Row Name 11/23/20 1545 12/21/20 1526 12/24/20 1430         Core Components/Risk Factors/Patient Goals Review   Personal Goals Review Weight Management/Obesity;Hypertension;Tobacco Cessation;Lipids;Diabetes Weight Management/Obesity;Hypertension;Tobacco Cessation;Lipids;Diabetes Weight Management/Obesity;Hypertension;Tobacco Cessation;Lipids;Diabetes     Review Lilyanna has been doing well with exercise. CBG's have been improved since insulin dose was decreased by Dr Kerr.Buddy Dutytinue to encourage smoking cessation Mahli has been doing well with exercise. Fayelynn's CBG's have varied between being too [place] or too low. Encouraged the patient to do a better job of monitoring her CBG's . Continue to encourage smoking cessation. Karyl has been doing well with exercise. Dossie's CBG's have varied between being too Garrod or too low. Encouraged the patient to do a better job of monitoring her CBG's . Continue to encourage smoking cessation. Kailene continues to smoke 5 cigarettes a day. Claribel completed exercise at cardiac rehab on 12/24/20.     Expected Outcomes Courtnei will continue to participate in  phase 2 cardiac rehab for exercie nutrtion and lifestyle modifications Ishitha will continue to participate in phase 2 cardiac rehab for exercie nutrtion and lifestyle modifications Chrisoula will continue to exercie follow  nutrtion and lifestyle  modifications upon completion of phase 2 cardiac rehab              Exercise Goals and Review:  Exercise Goals     Row Name 10/26/20 1346             Exercise Goals   Increase Physical Activity Yes       Intervention Provide advice, education, support and counseling about physical activity/exercise needs.;Develop an individualized exercise prescription for aerobic and resistive training based on initial evaluation findings, risk stratification, comorbidities and participant's personal goals.       Expected Outcomes Short Term: Attend rehab on a regular basis to increase amount of physical activity.;Long Term: Add in home exercise to make exercise part of routine and to increase amount of physical activity.;Long Term: Exercising regularly at least 3-5 days a week.       Increase Strength and Stamina Yes       Intervention Provide advice, education, support and counseling about physical activity/exercise needs.;Develop an individualized exercise prescription for aerobic and resistive training based on initial evaluation findings, risk stratification, comorbidities and participant's personal goals.       Expected Outcomes Short Term: Increase workloads from initial exercise prescription for resistance, speed, and METs.;Short Term: Perform resistance training exercises routinely during rehab and add in resistance training at home;Long Term: Improve cardiorespiratory fitness, muscular endurance and strength as measured by increased METs and functional capacity (6MWT)       Able to understand and use rate of perceived exertion (RPE) scale Yes       Intervention Provide education and explanation on how to use RPE scale       Expected Outcomes Short Term: Able to  use RPE daily in rehab to express subjective intensity level;Long Term:  Able to use RPE to guide intensity level when exercising independently       Knowledge and understanding of Target Heart Rate Range (THRR) Yes       Intervention Provide education and explanation of THRR including how the numbers were predicted and where they are located for reference       Expected Outcomes Short Term: Able to use daily as guideline for intensity in rehab;Long Term: Able to use THRR to govern intensity when exercising independently;Short Term: Able to state/look up THRR       Understanding of Exercise Prescription Yes       Intervention Provide education, explanation, and written materials on patient's individual exercise prescription       Expected Outcomes Short Term: Able to explain program exercise prescription;Long Term: Able to explain home exercise prescription to exercise independently                Exercise Goals Re-Evaluation:  Exercise Goals Re-Evaluation     Row Name 11/03/20 1624 12/08/20 1500 12/09/20 0821         Exercise Goal Re-Evaluation   Exercise Goals Review Increase Physical Activity;Increase Strength and Stamina;Able to understand and use rate of perceived exertion (RPE) scale;Knowledge and understanding of Target Heart Rate Range (THRR);Understanding of Exercise Prescription Increase Physical Activity;Increase Strength and Stamina;Able to understand and use rate of perceived exertion (RPE) scale;Knowledge and understanding of Target Heart Rate Range (THRR);Understanding of Exercise Prescription --     Comments Pt's first day of exercise in the [facility] program. Pt understands the Exercise Rx, THRR, RPE scale. Reviewed METs, Goals, and home exercise Rx. Pt is making progress in the CRP2 program and was able to increase to level 3  on nustep today Pt has had issues with her blood sugars and continues to work with her physican and the dietician on this issue. --     Expected Outcomes  Will continue to monitor patient and increase exercise workloads as tolerated. Will continue to monitor patient and increase exercise workloads as tolerated. --              Nutrition & Weight - Outcomes:  Pre Biometrics - 10/26/20 1327       Pre Biometrics   Waist Circumference 41 inches    Hip Circumference 45 inches    Waist to Hip Ratio 0.91 %    Triceps Skinfold 31 mm    % Body Fat 42.6 %    Grip Strength 25 kg    Flexibility 14.5 in    Single Leg Stand 4.75 seconds             Post Biometrics - 12/24/20 1345        Post  Biometrics   Height _0  (1.575 m)    Weight 171 lb 11.8 oz (77.9 kg)    Waist Circumference 41 inches    Hip Circumference 45 inches    Waist to Hip Ratio 0.91 %    BMI (Calculated) 31.4    Triceps Skinfold 32 mm    % Body Fat 43.2 %    Grip Strength 29 kg    Flexibility 16.25 in    Single Leg Stand 5.31 seconds             Nutrition:  Nutrition Therapy & Goals - 11/19/20 1355       Nutrition Therapy   Diet TLC; carb modified      Personal Nutrition Goals   Nutrition Goal Pt to build a healthy plate including vegetables, fruits, whole grains, and low-fat dairy products in a heart healthy meal plan.    Personal Goal #2 Pt to reduce time spent in hypoglycemia <1% per CGM    Personal Goal #3 CBG concentrations in the normal range or as close to normal as is safely possible.      Intervention Plan   Intervention Prescribe, educate and counsel regarding individualized specific dietary modifications aiming towards targeted core components such as weight, hypertension, lipid management, diabetes, heart failure and other comorbidities.;Nutrition handout(s) given to patient.    Expected Outcomes Short Term Goal: A plan has been developed with personal nutrition goals set during dietitian appointment.;Long Term Goal: Adherence to prescribed nutrition plan.             Nutrition Discharge:   Education Questionnaire Score:   Knowledge Questionnaire Score - 01/03/21 0839       Knowledge Questionnaire Score   Post Score 24/28             Goals reviewed with patient; copy given to patient.Dovie graduated from cardiac rehab program on 12/24/20 with completion of 14  exercise sessions in Phase II. Pt maintained good attendance and progressed nicely during his participation in rehab as evidenced by increased MET level.   Medication list reconciled. Repeat  PHQ score- 0 .  Pt has made significant lifestyle changes and should be commended for her success. Pt feels she has achieved her goals during cardiac rehab.   Pt plans to continue exercise by walking at home with her two dogs. Trey increased her distance on her post exercise walk test by 21 feet. Patient continued to have issues with hypo and hyperglycemia and was encouraged to keep  a close check on her CBG's.  Barnet Pall, RN,BSN 01/12/2021 2:58 PM

## 2020-12-29 ENCOUNTER — Other Ambulatory Visit (INDEPENDENT_AMBULATORY_CARE_PROVIDER_SITE_OTHER): Payer: Medicaid Other | Admitting: Internal Medicine

## 2020-12-29 ENCOUNTER — Other Ambulatory Visit: Payer: Self-pay

## 2020-12-29 ENCOUNTER — Ambulatory Visit (HOSPITAL_COMMUNITY): Payer: Medicaid Other

## 2020-12-29 DIAGNOSIS — B351 Tinea unguium: Secondary | ICD-10-CM | POA: Diagnosis not present

## 2020-12-29 DIAGNOSIS — E559 Vitamin D deficiency, unspecified: Secondary | ICD-10-CM
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
## 2020-12-31 ENCOUNTER — Ambulatory Visit (HOSPITAL_COMMUNITY): Payer: Medicaid Other

## 2020-12-31 LAB — VITAMIN D 25 HYDROXY (VIT D DEFICIENCY, FRACTURES): Vit D, 25-Hydroxy: 28.2 ng/mL — ABNORMAL LOW (ref 30.0–100.0)

## 2021-01-07 ENCOUNTER — Telehealth (INDEPENDENT_AMBULATORY_CARE_PROVIDER_SITE_OTHER): Payer: Medicaid Other | Admitting: Clinical

## 2021-01-07 ENCOUNTER — Other Ambulatory Visit: Payer: Medicaid Other | Admitting: Clinical

## 2021-01-07 DIAGNOSIS — F609 Personality disorder, unspecified: Secondary | ICD-10-CM

## 2021-01-10 ENCOUNTER — Encounter: Payer: Self-pay | Admitting: Podiatry

## 2021-01-10 ENCOUNTER — Ambulatory Visit (INDEPENDENT_AMBULATORY_CARE_PROVIDER_SITE_OTHER): Payer: Medicaid Other | Admitting: Podiatry

## 2021-01-10 ENCOUNTER — Other Ambulatory Visit: Payer: Self-pay

## 2021-01-10 DIAGNOSIS — R143 Flatulence: Secondary | ICD-10-CM | POA: Insufficient documentation

## 2021-01-10 DIAGNOSIS — D509 Iron deficiency anemia, unspecified: Secondary | ICD-10-CM | POA: Insufficient documentation

## 2021-01-10 DIAGNOSIS — R197 Diarrhea, unspecified: Secondary | ICD-10-CM | POA: Insufficient documentation

## 2021-01-10 DIAGNOSIS — M79674 Pain in right toe(s): Secondary | ICD-10-CM | POA: Diagnosis not present

## 2021-01-10 DIAGNOSIS — Z4689 Encounter for fitting and adjustment of other specified devices: Secondary | ICD-10-CM | POA: Insufficient documentation

## 2021-01-10 DIAGNOSIS — B351 Tinea unguium: Secondary | ICD-10-CM | POA: Diagnosis not present

## 2021-01-10 DIAGNOSIS — K625 Hemorrhage of anus and rectum: Secondary | ICD-10-CM

## 2021-01-10 DIAGNOSIS — Z794 Long term (current) use of insulin: Secondary | ICD-10-CM | POA: Insufficient documentation

## 2021-01-10 DIAGNOSIS — R899 Unspecified abnormal finding in specimens from other organs, systems and tissues: Secondary | ICD-10-CM | POA: Insufficient documentation

## 2021-01-10 DIAGNOSIS — E1149 Type 2 diabetes mellitus with other diabetic neurological complication: Secondary | ICD-10-CM

## 2021-01-10 DIAGNOSIS — M79675 Pain in left toe(s): Secondary | ICD-10-CM | POA: Diagnosis not present

## 2021-01-10 DIAGNOSIS — R32 Unspecified urinary incontinence: Secondary | ICD-10-CM | POA: Insufficient documentation

## 2021-01-10 DIAGNOSIS — N898 Other specified noninflammatory disorders of vagina: Secondary | ICD-10-CM | POA: Insufficient documentation

## 2021-01-10 DIAGNOSIS — Z860100 Personal history of colon polyps, unspecified: Secondary | ICD-10-CM | POA: Insufficient documentation

## 2021-01-10 DIAGNOSIS — K644 Residual hemorrhoidal skin tags: Secondary | ICD-10-CM | POA: Insufficient documentation

## 2021-01-10 DIAGNOSIS — R1310 Dysphagia, unspecified: Secondary | ICD-10-CM | POA: Insufficient documentation

## 2021-01-10 DIAGNOSIS — R142 Eructation: Secondary | ICD-10-CM | POA: Insufficient documentation

## 2021-01-10 DIAGNOSIS — F329 Major depressive disorder, single episode, unspecified: Secondary | ICD-10-CM | POA: Insufficient documentation

## 2021-01-10 HISTORY — DX: Hemorrhage of anus and rectum: K62.5
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
## 2021-01-12 NOTE — Progress Notes (Signed)
Subjective: 55 y.o. returns the office today for painful, elongated, thickened toenails which she cannot trim herself. The calluses have been doing well. No open lesions Denies any systemic complaints such as fevers, chills, nausea, vomiting.   PCP: Julieanne Manson, MD Endocrinologist: Dr. Talmage Coin, MD A1c: 6.7 on 08/12/2020  Objective: AAO 3, NAD DP/PT pulses palpable, CRT less than 3 seconds Sensation decreased with Semmes Weinstein monofilament. Nails hypertrophic, dystrophic, elongated, brittle, discolored 10. There is tenderness overlying the nails 1-5 bilaterally. There is no surrounding erythema or drainage along the nail sites. No open lesions/ulcerations Hammertoes present No pain with calf compression, swelling, warmth, erythema.  Assessment: Patient presents with symptomatic onychomycosis  Plan: -Treatment options including alternatives, risks, complications were discussed -Nails sharply debrided 10 without complication/bleeding. -Discussed daily foot inspection. If there are any changes, to call the office immediately.  -Follow-up in 3 months or sooner if any problems are to arise. In the meantime, encouraged to call the office with any questions, concerns, changes symptoms.  Ovid Curd, DPM

## 2021-01-17 MED ORDER — ITRACONAZOLE 100 MG PO CAPS: ORAL_CAPSULE | ORAL | 0 refills | Status: DC

## 2021-01-17 NOTE — Addendum Note (Signed)
Addended by: Marcene Duos on: 01/17/2021 10:49 PM   Modules accepted: Orders

## 2021-01-19 ENCOUNTER — Telehealth: Payer: Self-pay | Admitting: Internal Medicine

## 2021-01-19 NOTE — Telephone Encounter (Signed)
Called the pharmacy about itraconazole. Pharmacy notified that pre authorization is needed for itraconazole.

## 2021-01-19 NOTE — Telephone Encounter (Signed)
Pt. Called stating she was not able to get the itraconazole (SPORANOX) 100 MG capsule Because is $200 and her insurance will not cover it.  Patient will like to know if any alternative medication can be prescribed even she she pays out of pocket for it.    Please advise

## 2021-01-20 ENCOUNTER — Ambulatory Visit
Admission: RE | Admit: 2021-01-20 | Discharge: 2021-01-20 | Disposition: A | Discharge status: Home / Self Care | Payer: Medicaid Other | Source: Ambulatory Visit | Attending: Internal Medicine | Admitting: Internal Medicine

## 2021-01-20 ENCOUNTER — Other Ambulatory Visit: Payer: Self-pay | Admitting: Internal Medicine

## 2021-01-20 ENCOUNTER — Other Ambulatory Visit: Payer: Self-pay

## 2021-01-20 DIAGNOSIS — Z1231 Encounter for screening mammogram for malignant neoplasm of breast: Secondary | ICD-10-CM

## 2021-01-21 ENCOUNTER — Telehealth (INDEPENDENT_AMBULATORY_CARE_PROVIDER_SITE_OTHER): Payer: Medicaid Other | Admitting: Clinical

## 2021-01-21 DIAGNOSIS — F609 Personality disorder, unspecified: Secondary | ICD-10-CM

## 2021-01-21 LAB — FUNGUS CULTURE W SMEAR

## 2021-01-21 NOTE — Progress Notes (Signed)
   THERAPY PROGRESS NOTE  Session Time: 11-12pm Participation Level: Active Behavioral Response: CasualAlert Type of Therapy: Individual Therapy Treatment Goals addressed: Anger and Coping   Purpose: LCSW met with client for routine individual therapy to work towards treatment goals: coping with anger.   Intervention: LCSW met with patient for routine individual therapy to continue to work towards treatment goals. LCSW provided patient opportunity to check in to assess for any significant events and how she is doing today. LCSW praised patient to experiencing new project with [facility] staff where she will be receiving further support from other, participated in art and cooking classes. LCSW informed patient of LCSW's departure for July 15. Together with patient processed this change as patient became tearful. LCSW validated her feelings and agreed to do in home counseling to process this change and cope with it. LCSW expressed to patient gratitude for opportunity to be part of her treatment. LCSW praised patient on her open mindness of skills and on efforts towards treatment goals. LCSW assessed for SI/HI/command psychosis.  Effectiveness: Patient is alert x4 affect. Patient reports she had just finished meeting with coordinator for [place] where she will be participating to obtain healthy foods, art classes and group therapy. Patient shared she is excited to be able to address her weight in a healthy environment. Patient expressed understanding if her body feels good also mentally she will feel stable. Patient also shared she is excited to participate in the art classes, LCSW validated this as another coping skill she can use to express feelings.  Patient expressed sadness with presentation of tears when informed of the news of LCSW departure. Patient reports "I have abandonment issues" Patient reports she acknowledges she has trouble coping with change as she begins to feel comfortable with an individual.  LCSW validated patient's feelings and discussed more sessions to cope together this change. LCSW reinforced this wasn't a good bye just yet. Patient accepted LCSWs gratitude of allowing to be part of her treatment. Intervention was effective as patient was able to process her feeling. Progress towards goal is Ongoing. Patient denied active suicidal/homicidal/active psychosis.  Plan Patient offered next appointment for: 06/30 10am at patient's home.  Diagnosis:  unspecified personality disorder    [name], LCSW 01/21/2021

## 2021-01-27 ENCOUNTER — Other Ambulatory Visit: Payer: Medicaid Other | Admitting: Clinical

## 2021-01-28 ENCOUNTER — Telehealth: Payer: Self-pay | Admitting: Clinical
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
## 2021-02-02 ENCOUNTER — Telehealth (INDEPENDENT_AMBULATORY_CARE_PROVIDER_SITE_OTHER): Payer: Medicaid Other | Admitting: Clinical

## 2021-02-02 DIAGNOSIS — F609 Personality disorder, unspecified: Secondary | ICD-10-CM
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
## 2021-02-07 ENCOUNTER — Telehealth: Payer: Self-pay | Admitting: Internal Medicine

## 2021-02-07 ENCOUNTER — Encounter: Payer: Self-pay | Admitting: Internal Medicine

## 2021-02-07 DIAGNOSIS — B351 Tinea unguium: Secondary | ICD-10-CM

## 2021-02-07 DIAGNOSIS — L03012 Cellulitis of left finger: Secondary | ICD-10-CM

## 2021-02-07 HISTORY — DX: Cellulitis of left finger: L03.012

## 2021-02-07 HISTORY — DX: Tinea unguium: B35.1

## 2021-02-07 NOTE — Telephone Encounter (Signed)
Called [phone] [employer] for Prior Auth of Itraconazole 100 mg caps, 2 caps once daily for 42 days.

## 2021-02-07 NOTE — Telephone Encounter (Signed)
Patient initially treated for left ring finger onychomycosis and paronychium in March of 2022.  She failed a 42 day course of Terbinafine. Nail culture grew candida parapsilosis specie and Itraconazole 200 mg daily for 42 days ordered for better coverage.  This required prior auth, which I called in today to above phone.

## 2021-02-09 ENCOUNTER — Telehealth (INDEPENDENT_AMBULATORY_CARE_PROVIDER_SITE_OTHER): Payer: Medicaid Other | Admitting: Clinical

## 2021-02-09 DIAGNOSIS — F609 Personality disorder, unspecified: Secondary | ICD-10-CM | POA: Diagnosis not present

## 2021-02-09 NOTE — Progress Notes (Signed)
   THERAPY PROGRESS NOTE Virtual via google duo Session Time: 2-3pm Participation Level: Active Behavioral Response: Casual Type of Therapy: Individual Therapy Treatment Goals addressed: Anger and Coping   Purpose: LCSW met with client for routine individual therapy to work towards treatment goals: coping with anger   Intervention: LCSW met with patient via telehealth for last routine individual session as LCSW is terminating role at this clinic. LCSW provided patient opportunity to check in to assess for any significant events and how she is doing today. LCSW utilized intervention of Strength-based as this session was used to review treatment goals and plan for continued care. In this session discussed tools such as CBT of reframing thoughts to help cope with distressing situations. LCSW challenged patient to use the good things happening to her as motivation when she may start to think of her anger aggressively. LCSW and patient processed her thinking to reach out to her daughter as a support person. Discussed with patient crisis resources will be sent via google voice to have available. LCSW thanked patient for opportunity to be present in her journey. LCSW assessed for active SI/HI/command psychosis.  Effectiveness: Patient is alert x4 affect. 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 Patient processed just confusion on the sisters response with the delay. Patient reports she is trying to not get frustrated as she recognizes it is not her responsibility to problem solve for the sister and just hopes sister of teenager will be able to come. Patient reports she will observe if any excuses continues and if not she will take it upon herself to seek out support with DSS.   Patient processed she has been thinking of seeking out her daughter for support and telling her everything that has been going on. Patient shared she hasn't told  everything to her daughter because she fears on her response as they are similar. Patient reports she is scared to have physical side effects due to these stressors and awareness if she doesn't tell her and finds out it'll impact their relationship. Patient processed she is motivated by her daughter and granddaughter to be present and be healthy. Patient recognized the importance of having another individual for support aside of therapist in her personal life and will think about talking to her daughter about what is going as she recognizes the more she keeps it to herself aside of therapy, it is still hurting her.   Patient processed she is sad to see LCSW leave but it expressed understanding. Patient processed she would like to continue her treatment. 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 Patient expressed understanding of crisis resources.   Intervention was effective as patient was able to reflect, and engaged in the session.Patient was receptive to LCSW gratitude. Progress towards goal is Ongoing and Achieved. Patient denied active suicidal/homicidal/active psychosis.  Plan Patient offered next appointment for: until next social worker is available.   Diagnosis:  unspecified anxiety disorder.     [name], LCSW 02/09/2021

## 2021-02-15 ENCOUNTER — Other Ambulatory Visit: Payer: Self-pay | Admitting: Internal Medicine

## 2021-02-18 ENCOUNTER — Emergency Department (HOSPITAL_COMMUNITY)
Admission: EM | Admit: 2021-02-18 | Discharge: 2021-02-18 | Disposition: A | Discharge status: Home / Self Care | Payer: Medicaid Other | Attending: Emergency Medicine | Admitting: Emergency Medicine

## 2021-02-18 DIAGNOSIS — I130 Hypertensive heart and chronic kidney disease with heart failure and stage 1 through stage 4 chronic kidney disease, or unspecified chronic kidney disease: Secondary | ICD-10-CM | POA: Diagnosis not present

## 2021-02-18 DIAGNOSIS — I2510 Atherosclerotic heart disease of native coronary artery without angina pectoris: Secondary | ICD-10-CM | POA: Insufficient documentation

## 2021-02-18 DIAGNOSIS — Z79899 Other long term (current) drug therapy: Secondary | ICD-10-CM | POA: Insufficient documentation

## 2021-02-18 DIAGNOSIS — Z20822 Contact with and (suspected) exposure to covid-19: Secondary | ICD-10-CM

## 2021-02-18 DIAGNOSIS — F17210 Nicotine dependence, cigarettes, uncomplicated: Secondary | ICD-10-CM | POA: Diagnosis not present

## 2021-02-18 DIAGNOSIS — Z794 Long term (current) use of insulin: Secondary | ICD-10-CM | POA: Insufficient documentation

## 2021-02-18 DIAGNOSIS — I5022 Chronic systolic (congestive) heart failure: Secondary | ICD-10-CM | POA: Diagnosis not present

## 2021-02-18 DIAGNOSIS — N183 Chronic kidney disease, stage 3 unspecified: Secondary | ICD-10-CM | POA: Diagnosis not present

## 2021-02-18 DIAGNOSIS — Z7902 Long term (current) use of antithrombotics/antiplatelets: Secondary | ICD-10-CM | POA: Diagnosis not present

## 2021-02-18 DIAGNOSIS — E1122 Type 2 diabetes mellitus with diabetic chronic kidney disease: Secondary | ICD-10-CM | POA: Insufficient documentation

## 2021-02-18 NOTE — ED Provider Notes (Signed)
[facility] EMERGENCY DEPARTMENT Provider Note   CSN: [id number] Arrival date & time: 02/18/21  2036     History No chief complaint on file.   [name] [name] is a 55 y.o. female.  HPI   Pt complains of COVID exposure yesterday.  She is asymptomatic, boosted x2 and vaccinated x2.  Patient is feeling well, no symptoms yet.  Just wants to be tested for COVID.  Past Medical History:  Diagnosis Date   Acute myocardial infarction of other lateral wall, initial episode of care    Acute myocardial infarction, unspecified site, initial episode of care    Acute osteomyelitis    Allergic rhinitis    Anemia    Anginal pain ([place])    07/15/13- no chest pain in months"   Anxiety    Bipolar affective ([place])    CAD (coronary artery disease) 07/2011   s/p DES mid and distal RCA with 50% LAD   CKD stage 1 due to type 2 diabetes mellitus (HCC)    Daily headache    not daily   Depression    Bipolar disorder   Diabetic foot ulcer associated with diabetes mellitus due to underlying condition ([place]) 09/26/2018   Diabetic gastroparesis associated with type 2 diabetes mellitus ([place])    Diabetic peripheral neuropathy associated with type 2 diabetes mellitus ([place])    Diabetic retinopathy    Esophageal stenosis    Esophageal ulcer    Esophagitis    Gastroparesis    Genital herpes    Reportedly tested and documented by [facility]--rare occurrences   GERD (gastroesophageal reflux disease)    Heart murmur    History of stomach ulcers    Hyperlipidemia    Hypertension    Inferior MI ([place]) 01/20/2009   Archie Endo on 12/19/2012, "that's the only one I've had" (12/19/2012)   Migraines    Onychomycosis 02/07/2021   Orthopnoea    Paronychia of finger, left 02/07/2021   Pneumonia 2012   Polysubstance abuse ([place])    Crack cocaine--none since 2008, MJ, ETOH:  clean of all since 2008   Renal insufficiency    Renal lithiasis    Bilateral   Rheumatoid arthritis(714.0)    Sebaceous cyst     Stroke [facility]) 2011   denies residual on 12/19/2012.  "Years ago"   Type II diabetes mellitus ([place])    Previously uncontrolled for many years with multiple complications.  2017 controlled. 05/2016:  6.7%   Vitamin D deficiency     Patient Active Problem List   Diagnosis Date Noted   Onychomycosis 02/07/2021   Belching 01/10/2021   Dysphagia 01/10/2021   Encounter for fitting and adjustment of other specified devices 01/10/2021   Diarrhea 01/10/2021   External hemorrhoids 01/10/2021   Hypercalcemia 01/10/2021   Intestinal gas excretion 01/10/2021   Iron deficiency anemia 01/10/2021   Long term (current) use of insulin ([place]) 01/10/2021   Major depressive disorder, single episode, unspecified 01/10/2021   Personal history of colonic polyps 01/10/2021   Rectal bleeding 01/10/2021   Unspecified abnormal finding in specimens from other organs, systems and tissues 01/10/2021   Urinary incontinence 01/10/2021   Vaginal odor 01/10/2021   Paronychia of finger, left 10/08/2020   CKD stage 1 due to type 2 diabetes mellitus ([place])    Unstable angina (HCC)    Closed fracture of distal end of left radius 04/22/2020   Vitamin D deficiency 02/16/2020   Renal insufficiency    Renal lithiasis    Proliferative  retinopathy due to type 1 diabetes mellitus ([place]) 09/03/2019   Stress and adjustment reaction 09/03/2019   Tinea cruris 05/13/2019   Elevated alkaline phosphatase measurement 02/25/2019   Furunculosis 02/25/2019   Corneal edema 12/02/2018   Iron deficiency 12/02/2018   Neuropathy 12/02/2018   Molluscum contagiosum 11/19/2018   Diabetic ulcer of left heel associated with type 2 diabetes mellitus, limited to breakdown of skin ([place]) 11/19/2018   Diabetic foot ulcer associated with diabetes mellitus due to underlying condition ([place]) 09/26/2018   Chest pain 06/15/2018   Bipolar disease, chronic ([place]) 06/15/2018   H/O esophageal ulcer 06/15/2018   Acute pulmonary edema ([place])    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   Diabetic gastroparesis associated with type 2 diabetes mellitus ([place])    Diabetic peripheral neuropathy associated with type 2 diabetes mellitus ([place])    Acute osteomyelitis of left foot ([place]) 02/15/2018   Foot pain 02/15/2018   Nausea and vomiting 02/15/2018   Hypokalemia 02/15/2018   Fibromyalgia 05/29/2017   Insomnia 05/29/2017   Shoulder injury 05/29/2017   Closed displaced fracture of middle phalanx of right ring finger 05/26/2017   Urinary, incontinence, stress female 10/14/2015   Obesity (BMI 30-39.9) 06/21/2015   Acute renal failure syndrome (HCC)    CKD (chronic kidney disease), stage III ([place]) 99/37/1696   Chronic systolic congestive heart failure, NYHA class 1 ([place]) 02/21/2015   History of NSTEMI w/ DES to cfx May 2014 10/02/2014   Nuclear cataract of right eye 09/17/2014   Status post intraocular lens implant 09/17/2014   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   PCO (posterior capsular opacification) 11/26/2013   HLD (hyperlipidemia)    Tobacco abuse 10/03/2013   Amblyopia 10/02/2013   Corneal opacification 10/02/2013   Retinal detachment, tractional, left eye 10/02/2013   Atilano risk medication use 02/20/2013   Diabetic gastroparesis ([place]) 01/16/2013   Nausea vomiting and diarrhea 12/29/2012   CAD S/P percutaneous coronary angioplasty 08/25/2010   Coronary atherosclerosis 08/25/2010   Dehydration 06/17/2010   Anemia 03/21/2010   ALLERGIC RHINITIS 12/28/2009   HTN (hypertension) 10/08/2009   Rheumatoid arthritis ([place]) 02/18/2009   Diabetic retinopathy ([place]) 08/13/2007   GERD 03/27/2007   RENAL INSUFFICIENCY, CHRONIC 03/27/2007   Dyslipidemia 03/25/2007   Anxiety state 03/25/2007   DEPRESSION 03/25/2007   ULCER, ESOPHAGUS WITHOUT BLEEDING 07/06/2006   ESOPHAGEAL STENOSIS 07/06/2006   Diabetes mellitus with multiple complications ([place]) 78/93/8101    Past Surgical History:  Procedure Laterality Date   CORONARY ANGIOPLASTY WITH STENT PLACEMENT  01/20/2009    "2" (12/19/2012)   CORONARY ANGIOPLASTY WITH STENT PLACEMENT  2012   "2" (12/19/2012)   CORONARY ANGIOPLASTY WITH STENT PLACEMENT  12/19/2012   "2" (12/19/2012)   CORONARY STENT INTERVENTION N/A 08/13/2020   Procedure: CORONARY STENT INTERVENTION;  Surgeon: Nelva Bush, MD;  Location: [place] CV [facility];  Service: Cardiovascular;  Laterality: N/A;   ESOPHAGOGASTRODUODENOSCOPY N/A 02/26/2015   Procedure: ESOPHAGOGASTRODUODENOSCOPY (EGD);  Surgeon: Teena Irani, MD;  Location: [facility] ENDOSCOPY;  Service: Endoscopy;  Laterality: N/A;   EYE SURGERY     Multiple surgeries of both eyes:  last laser was 09/08/2015 of right eye.  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   GAS INSERTION Left 07/16/2013   Procedure: INSERTION OF GAS;  Surgeon: Adonis Brook, MD;  Location: [place];  Service: Ophthalmology;  Laterality: Left;  SF6   GAS/FLUID EXCHANGE Left 07/30/2013   Procedure: GAS/FLUID EXCHANGE;  Surgeon: Adonis Brook, MD;  Location: [place];  Service: Ophthalmology;  Laterality: Left;   INTRAVASCULAR PRESSURE WIRE/FFR STUDY N/A 08/13/2020   Procedure: INTRAVASCULAR PRESSURE WIRE/FFR STUDY;  Surgeon: Nelva Bush, MD;  Location: [place] [facility];  Service: Cardiovascular;  Laterality: N/A;   IRRIGATION AND DEBRIDEMENT SEBACEOUS CYST Right 03/2011   "pointer" (12/19/2012)   LEFT HEART CATH AND CORONARY ANGIOGRAPHY N/A 08/13/2020   Procedure: LEFT HEART CATH AND CORONARY ANGIOGRAPHY;  Surgeon: Nelva Bush, MD;  Location: [place] [facility];  Service: Cardiovascular;  Laterality: N/A;   LEFT HEART CATHETERIZATION WITH CORONARY ANGIOGRAM N/A 07/06/2011   Procedure: LEFT HEART CATHETERIZATION WITH CORONARY ANGIOGRAM;  Surgeon: Jettie Booze, MD;  Location: [facility] CATH LAB;  Service: Cardiovascular;  Laterality: N/A;  possible PCI   LEFT HEART CATHETERIZATION WITH CORONARY ANGIOGRAM N/A 12/19/2012   Procedure: LEFT HEART CATHETERIZATION WITH CORONARY ANGIOGRAM;  Surgeon: Jettie Booze, MD;  Location: [facility] CATH LAB;  Service: Cardiovascular;  Laterality: N/A;   MEMBRANE PEEL Left 07/16/2013   Procedure: MEMBRANE PEEL;  Surgeon: Adonis Brook, MD;  Location: [place];  Service: Ophthalmology;  Laterality: Left;   PARS PLANA VITRECTOMY Left 07/16/2013   Procedure: PARS PLANA VITRECTOMY WITH 23 GAUGE;  Surgeon: Adonis Brook, MD;  Location: [place];  Service: Ophthalmology;  Laterality: Left;   PARS PLANA VITRECTOMY Left 07/30/2013   Procedure: PARS PLANA VITRECTOMY WITH 23 GAUGE WITH ENDOLASER;  Surgeon: Adonis Brook, MD;  Location: [place];  Service: Ophthalmology;  Laterality: Left;  with endolaser   PERCUTANEOUS CORONARY STENT INTERVENTION (PCI-S) N/A 07/06/2011   Procedure: PERCUTANEOUS CORONARY STENT INTERVENTION (PCI-S);  Surgeon: Jettie Booze, MD;  Location: [facility] CATH LAB;  Service: Cardiovascular;  Laterality: N/A;   PHOTOCOAGULATION WITH LASER Left 07/16/2013   Procedure: PHOTOCOAGULATION WITH LASER;  Surgeon: Adonis Brook, MD;  Location: [place];  Service: Ophthalmology;  Laterality: Left;  ENDOLASER     OB History   No obstetric history on file.     Family History  Problem Relation Age of Onset   Breast cancer Mother 76   Hypertension Father    Stomach cancer Sister 34       cause of death   Hypertension Sister    Lung cancer Sister 19       Cause of death   Cancer Sister 98       ? stomach cancer   Colon cancer Neg Hx     Social History   Tobacco Use   Smoking status: Every Day    Packs/day: 0.50    Years: 22.00    Pack years: 11.00    Types: Cigarettes   Smokeless tobacco: Never   Tobacco comments:    Has been to classes--not sure if she wants to quit.  Changing to non menthol..  Chantix, patches, gum never helped.  9/19:  lot going on.  Vaping Use   Vaping Use: Never used  Substance Use Topics   Alcohol use: Yes    Alcohol/week: 0.0 standard drinks    Comment: Extremely rare--once yearly or less.   Drug use: No    Types: "Crack"  cocaine, Marijuana    Comment: 12/31/2012 " clean from crack.  Does still smoke marijuana     Home Medications Prior to Admission medications   Medication Sig Start Date End Date Taking? Authorizing Provider  Accu-Chek FastClix Lancets MISC CHECK BLOOD SUGAR 5 TIMES DAILY 12/24/18   [provider]  ACCU-CHEK GUIDE test strip USE AS DIRECTED CHECK BLOOD SUGAR 5 TIMES A DAY 12/23/18  [provider]  acetaminophen (TYLENOL) 500 MG tablet Take 500-1,000 mg by mouth every 6 (six) hours as needed for moderate pain.     [provider]  albuterol (PROVENTIL HFA;VENTOLIN HFA) 108 (90 Base) MCG/ACT inhaler Inhale 2 puffs into the lungs every 6 (six) hours as needed for wheezing or shortness of breath. 12/01/16   Mack Hook, MD  ammonium lactate (AMLACTIN) 12 % cream Apply topically as needed for dry skin. 09/15/19   Trula Slade, DPM  Blood Glucose Monitoring Suppl (ACCU-CHEK GUIDE ME) w/Device KIT USE TO MONITOR BLOOD SUGAR FIVE TIMES A DAY DX  E10.22 IN VITRO 12/26/18   [provider]  clopidogrel (PLAVIX) 75 MG tablet Take 1 tablet (75 mg total) by mouth daily. 09/10/20   Jettie Booze, MD  Continuous Blood Gluc Sensor (FREESTYLE LIBRE 2 SENSOR) MISC Apply topically every other day. 12/29/20   [provider]  [employer] D3 25 MCG (1000 UT) capsule TAKE 1 CAPSULE BY MOUTH EVERY DAY 02/16/21   Mack Hook, MD  cyclobenzaprine (FLEXERIL) 10 MG tablet Take 1-2 tablets by mouth at bedtime as needed for muscle spasms. Muscle spasms 09/21/16   [provider]  diphenhydrAMINE (BENADRYL) 25 MG tablet Take 25 mg by mouth every 6 (six) hours as needed for itching or allergies.    [provider]  famotidine (PEPCID) 20 MG tablet Take 20 mg by mouth 2 (two) times daily. 09/11/20   [provider]  folic acid (FOLVITE) 045 MCG tablet Take 1,200 mcg by mouth daily.    [provider]  furosemide (LASIX) 40 MG tablet TAKE  1 TABLET BY MOUTH EVERY DAY IN THE MORNING 05/20/20   Mack Hook, MD  gabapentin (NEURONTIN) 100 MG capsule TAKE 1 CAPSULE BY MOUTH AT BEDTIME. 10/13/19   Trula Slade, DPM  Incontinence Supply Disposable (POISE ULTRA THINS) PADS Use 5 pads daily as needed for urinary stress incontinence 01/03/16   Mack Hook, MD  insulin aspart (NOVOLOG) 100 UNIT/ML injection Inject 4-16 Units into the skin 3 (three) times daily with meals. Per Sliding scale CBG 100-150: 4 units, 151-200: 6 units, 201-250: 8 units, 251-300: 10 units, 301-350: 12 units, 351-400: 14 units, > 401; call doctor    [provider]  itraconazole (SPORANOX) 100 MG capsule 2 caps by mouth once daily for 42 days. 01/17/21   Mack Hook, MD  LANTUS SOLOSTAR 100 UNIT/ML Solostar Pen Inject 10 Units into the skin 2 (two) times daily.  01/15/18   [provider]  methotrexate (RHEUMATREX) 2.5 MG tablet Take 10 mg by mouth every Wednesday. 04/21/19   [provider]  metoprolol tartrate (LOPRESSOR) 25 MG tablet TAKE 1 TABLET BY MOUTH TWICE A DAY 05/27/20   Mack Hook, MD  nitroGLYCERIN (NITROSTAT) 0.4 MG SL tablet Place 1 tablet (0.4 mg total) under the tongue every 5 (five) minutes as needed for chest pain. Max 3 doses, call 911. 04/12/20   Jettie Booze, MD  ondansetron (ZOFRAN) 4 MG tablet TAKE 1 TABLET BY MOUTH EVERY 6 HOURS. 12/12/20   Mack Hook, MD  pantoprazole (PROTONIX) 40 MG tablet Take 40 mg by mouth daily.    [provider]  pantoprazole (PROTONIX) 40 MG tablet Take by mouth. 11/15/09   [provider]  prednisoLONE acetate (PRED FORTE) 1 % ophthalmic suspension Place 4-5 drops into the left eye daily. 04/22/17   [provider]  rosuvastatin (CRESTOR) 40 MG tablet Take 1 tablet (40 mg total)  by mouth daily. 04/12/20   Jettie Booze, MD  sertraline (ZOLOFT) 100 MG tablet Take 150 mg by mouth daily.    [provider]   sertraline (ZOLOFT) 100 MG tablet Take by mouth. 11/15/09   [provider]  traMADol (ULTRAM) 50 MG tablet TAKE 1 TABLET EVERY 6 HOURS AS NEEDED FOR MODERATE PAIN 03/06/17   Mack Hook, MD    Allergies    Aspirin  Review of Systems   Review of Systems  Constitutional:        COVID exposure   Physical Exam Updated Vital Signs LMP 01/12/2015   Physical Exam Vitals and nursing note reviewed. Exam conducted with a chaperone present.  Constitutional:      General: She is not in acute distress.    Appearance: Normal appearance.  HENT:     Head: Normocephalic and atraumatic.  Eyes:     General: No scleral icterus.    Extraocular Movements: Extraocular movements intact.     Pupils: Pupils are equal, round, and reactive to light.  Skin:    Coloration: Skin is not jaundiced.  Neurological:     Mental Status: She is alert. Mental status is at baseline.     Coordination: Coordination normal.    ED Results / Procedures / Treatments   Labs (all labs ordered are listed, but only abnormal results are displayed) Labs Reviewed - No data to display  EKG None  Radiology No results found.  Procedures Procedures   Medications Ordered in ED Medications - No data to display  ED Course  I have reviewed the triage vital signs and the nursing notes.  Pertinent labs & imaging results that were available during my care of the patient were reviewed by me and considered in my medical decision making (see chart for details).    MDM Rules/Calculators/A&P                           Patient presents with COVID-19 and posterior.  She is asymptomatic, but extended period staples are vital, she has not any respiratory distress.  At this point I think we will do the test at home.  Return precautions discussed.  Patient is agreeable.  Final Clinical Impression(s) / ED Diagnoses Final diagnoses:  None    Rx / DC Orders ED Discharge Orders     None        Sherrill Raring, Hershal Coria 19/50/93 [id number]    Delora Fuel, MD 24/58/09 [id number]

## 2021-02-18 NOTE — Discharge Instructions (Addendum)
You are tested today for COVID test will result in the next 6 to 24 hours.  You should receive a call if positive, but in case he did not want you to sign up for MyChart.  [facility] MyChart comment health for the lymphocyte improvement.  Despite you are able to know for sure if these are positive.

## 2021-02-18 NOTE — ED Triage Notes (Signed)
Pt concerned for COVID exposure. Denies any symptoms/concerns

## 2021-02-19 LAB — SARS CORONAVIRUS 2 (TAT 6-24 HRS): SARS Coronavirus 2: NEGATIVE

## 2021-02-21 ENCOUNTER — Telehealth: Payer: Self-pay | Admitting: Internal Medicine

## 2021-02-21 ENCOUNTER — Other Ambulatory Visit (INDEPENDENT_AMBULATORY_CARE_PROVIDER_SITE_OTHER): Payer: Medicaid Other

## 2021-02-21 DIAGNOSIS — U071 COVID-19: Secondary | ICD-10-CM

## 2021-02-21 LAB — POC COVID19 BINAXNOW: SARS Coronavirus 2 Ag: POSITIVE — AB

## 2021-02-21 MED ORDER — NIRMATRELVIR/RITONAVIR (PAXLOVID)TABLET: 3.0000 | ORAL_TABLET | Freq: Two times a day (BID) | ORAL | 0 refills | Status: AC

## 2021-02-21 NOTE — Progress Notes (Signed)
Symptoms started 2 days ago. Called patient to let her know she tested positive for COVID.   She is vaccinated with Moderna and has received 2 boosters, the last 11/22/20. Sending in Rx for Paxlovid and to hold her Crestor and Itraconazole until completes her Paxlovid course. To call a progress report in 2 days.   To start Paxlovid immediately. Self isolation discussed.

## 2021-02-21 NOTE — Telephone Encounter (Signed)
Patient and a [facility] nurse called this morning stating pt. Had been at the ER Friday and had a negative COVID test because she had been in contact with a girl positive for COVID on Friday at 8pm   Pt. Started feeling  cold like symptoms Saturday  Sore throat and runny nose. Pt. Would like to know if she needs to be seen to evaluate.  Discussed with Dr.Mulberry . Pt. To take tylenol for fever and aches Pt. Informed you do not test for COVID right after exposure. It is recommended 5 days after unless you are having symptoms  Pt. Scheduled for COVID test today at 3:45pm

## 2021-02-21 NOTE — Addendum Note (Signed)
Addended by: Marcene Duos on: 02/21/2021 04:12 PM   Modules accepted: Orders

## 2021-02-23 ENCOUNTER — Other Ambulatory Visit: Payer: Self-pay | Admitting: Internal Medicine

## 2021-02-28 ENCOUNTER — Telehealth: Payer: Self-pay

## 2021-02-28 NOTE — Telephone Encounter (Signed)
Pt called to confirm if she can go back to taking all her regular medications after finishing the Rx given for covid.

## 2021-03-01 ENCOUNTER — Other Ambulatory Visit: Payer: Self-pay

## 2021-03-01 ENCOUNTER — Telehealth: Payer: Self-pay

## 2021-03-01 ENCOUNTER — Other Ambulatory Visit (INDEPENDENT_AMBULATORY_CARE_PROVIDER_SITE_OTHER): Payer: Medicaid Other

## 2021-03-01 DIAGNOSIS — Z79899 Other long term (current) drug therapy: Secondary | ICD-10-CM

## 2021-03-01 NOTE — Telephone Encounter (Signed)
Pt called to send lab results from 03/01/21 to Dr Sharmon Revere by fax at [phone]

## 2021-03-01 NOTE — Telephone Encounter (Signed)
Restart her regular meds

## 2021-03-02 ENCOUNTER — Other Ambulatory Visit: Payer: Self-pay

## 2021-03-02 ENCOUNTER — Ambulatory Visit (HOSPITAL_COMMUNITY)
Admission: EM | Admit: 2021-03-02 | Discharge: 2021-03-02 | Disposition: A | Discharge status: Home / Self Care | Payer: Medicaid Other | Attending: Medical Oncology | Admitting: Medical Oncology

## 2021-03-02 ENCOUNTER — Encounter (HOSPITAL_COMMUNITY): Payer: Self-pay | Admitting: *Deleted

## 2021-03-02 DIAGNOSIS — S46811A Strain of other muscles, fascia and tendons at shoulder and upper arm level, right arm, initial encounter: Secondary | ICD-10-CM

## 2021-03-02 LAB — CBC WITH DIFFERENTIAL/PLATELET
Basophils Absolute: 0.1 10*3/uL (ref 0.0–0.2)
Basos: 1 %
EOS (ABSOLUTE): 0.2 10*3/uL (ref 0.0–0.4)
Eos: 2 %
Hematocrit: 44 % (ref 34.0–46.6)
Hemoglobin: 14 g/dL (ref 11.1–15.9)
Immature Grans (Abs): 0.1 10*3/uL (ref 0.0–0.1)
Immature Granulocytes: 1 %
Lymphocytes Absolute: 2.1 10*3/uL (ref 0.7–3.1)
Lymphs: 25 %
MCH: 29.7 pg (ref 26.6–33.0)
MCHC: 31.8 g/dL (ref 31.5–35.7)
MCV: 93 fL (ref 79–97)
Monocytes Absolute: 0.6 10*3/uL (ref 0.1–0.9)
Monocytes: 7 %
Neutrophils Absolute: 5.6 10*3/uL (ref 1.4–7.0)
Neutrophils: 64 %
Platelets: 263 10*3/uL (ref 150–450)
RBC: 4.72 x10E6/uL (ref 3.77–5.28)
RDW: 16.2 % — ABNORMAL HIGH (ref 11.7–15.4)
WBC: 8.7 10*3/uL (ref 3.4–10.8)

## 2021-03-02 LAB — COMPREHENSIVE METABOLIC PANEL
ALT: 6 IU/L (ref 0–32)
AST: 15 IU/L (ref 0–40)
Albumin/Globulin Ratio: 1.5 (ref 1.2–2.2)
Albumin: 4.5 g/dL (ref 3.8–4.9)
Alkaline Phosphatase: 134 IU/L — ABNORMAL HIGH (ref 44–121)
BUN/Creatinine Ratio: 25 — ABNORMAL HIGH (ref 9–23)
BUN: 33 mg/dL — ABNORMAL HIGH (ref 6–24)
Bilirubin Total: 0.4 mg/dL (ref 0.0–1.2)
CO2: 14 mmol/L — ABNORMAL LOW (ref 20–29)
Calcium: 9.9 mg/dL (ref 8.7–10.2)
Chloride: 108 mmol/L — ABNORMAL HIGH (ref 96–106)
Creatinine, Ser: 1.3 mg/dL — ABNORMAL HIGH (ref 0.57–1.00)
Globulin, Total: 3.1 g/dL (ref 1.5–4.5)
Glucose: 203 mg/dL — ABNORMAL HIGH (ref 65–99)
Potassium: 4 mmol/L (ref 3.5–5.2)
Sodium: 143 mmol/L (ref 134–144)
Total Protein: 7.6 g/dL (ref 6.0–8.5)
eGFR: 49 mL/min/{1.73_m2} — ABNORMAL LOW (ref >59)

## 2021-03-02 MED ORDER — CYCLOBENZAPRINE HCL 10 MG PO TABS: 10.0000 mg | ORAL_TABLET | Freq: Every evening | ORAL | 0 refills | Status: DC | PRN
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
## 2021-03-02 NOTE — ED Triage Notes (Signed)
PT tested Positive for COVID 02-21-21

## 2021-03-02 NOTE — Telephone Encounter (Signed)
Pt.notified

## 2021-03-03 ENCOUNTER — Encounter: Payer: Self-pay | Admitting: Internal Medicine

## 2021-03-03 ENCOUNTER — Other Ambulatory Visit: Payer: Self-pay | Admitting: Internal Medicine

## 2021-03-03 ENCOUNTER — Ambulatory Visit (INDEPENDENT_AMBULATORY_CARE_PROVIDER_SITE_OTHER): Payer: Medicaid Other | Admitting: Internal Medicine

## 2021-03-03 VITALS — BP 132/72 | HR 72 | Resp 20 | Ht 61.0 in | Wt 170.0 lb

## 2021-03-03 DIAGNOSIS — Z720 Tobacco use: Secondary | ICD-10-CM

## 2021-03-03 DIAGNOSIS — M62838 Other muscle spasm: Secondary | ICD-10-CM | POA: Diagnosis not present

## 2021-03-03 DIAGNOSIS — Z716 Tobacco abuse counseling: Secondary | ICD-10-CM | POA: Diagnosis not present

## 2021-03-03 MED ORDER — NICOTINE 10 MG/ML NA SOLN: NASAL | 4 refills | Status: DC

## 2021-03-03 NOTE — Patient Instructions (Addendum)
[facility]  Directions  Website Address: [address] [place], [place], [place] [address]  Phone: [phone]   Get the muscle relaxant filled
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
## 2021-03-04 ENCOUNTER — Other Ambulatory Visit: Payer: Self-pay

## 2021-03-04 MED ORDER — ITRACONAZOLE 100 MG PO CAPS: ORAL_CAPSULE | ORAL | 0 refills | Status: DC

## 2021-03-08 NOTE — Telephone Encounter (Signed)
Called [facility] and they faxed over paperwork to call in for a prior authorization--called and completed intake with [facility]

## 2021-03-14 IMAGING — CR DG CHEST 2V
2 series · 2 of 2 positions shown · non-contrast
Comparison: 03/25/2020

CLINICAL DATA: Chest pain and short of breath 1 week

EXAM:
CHEST - 2 VIEW

[chest pa]
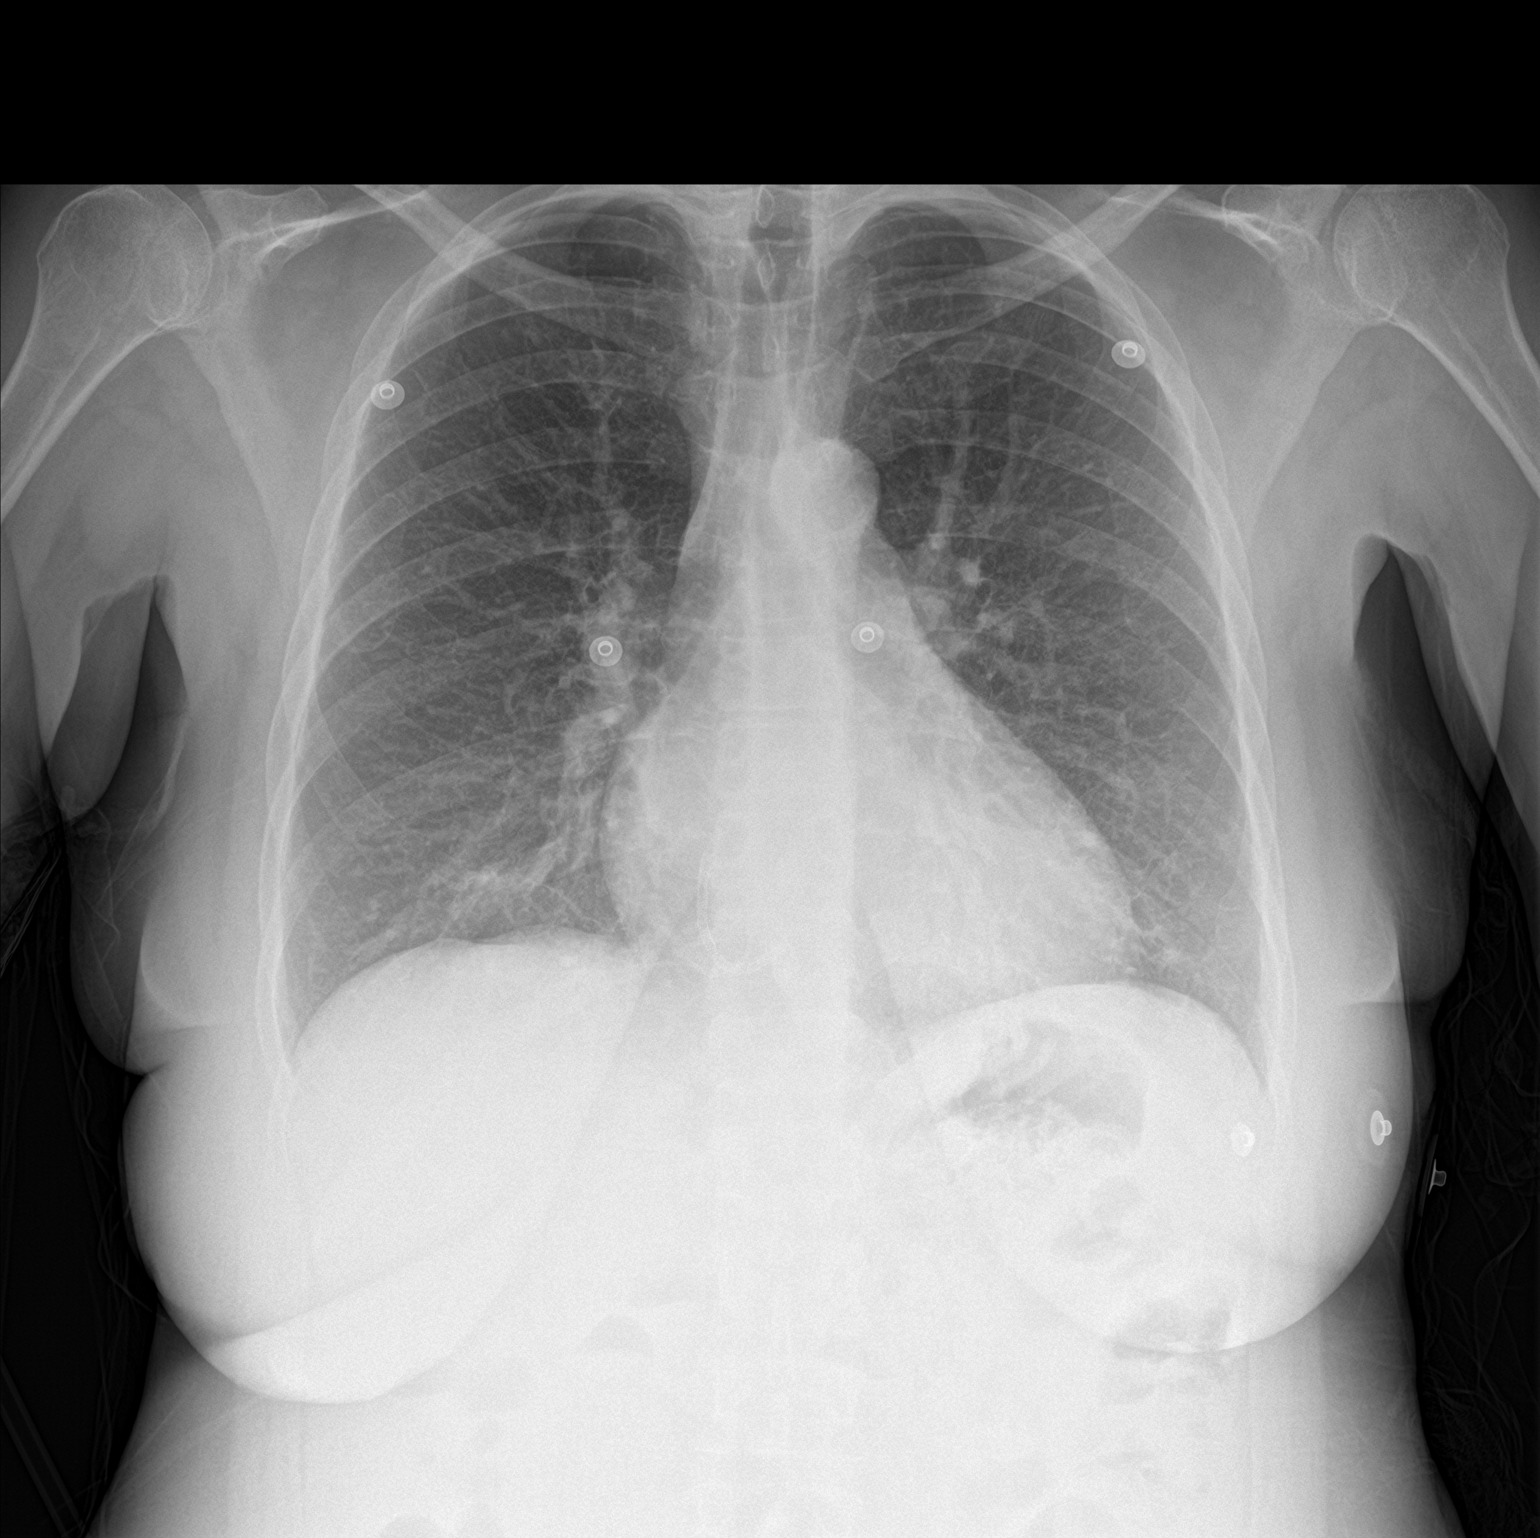

[chest lat]
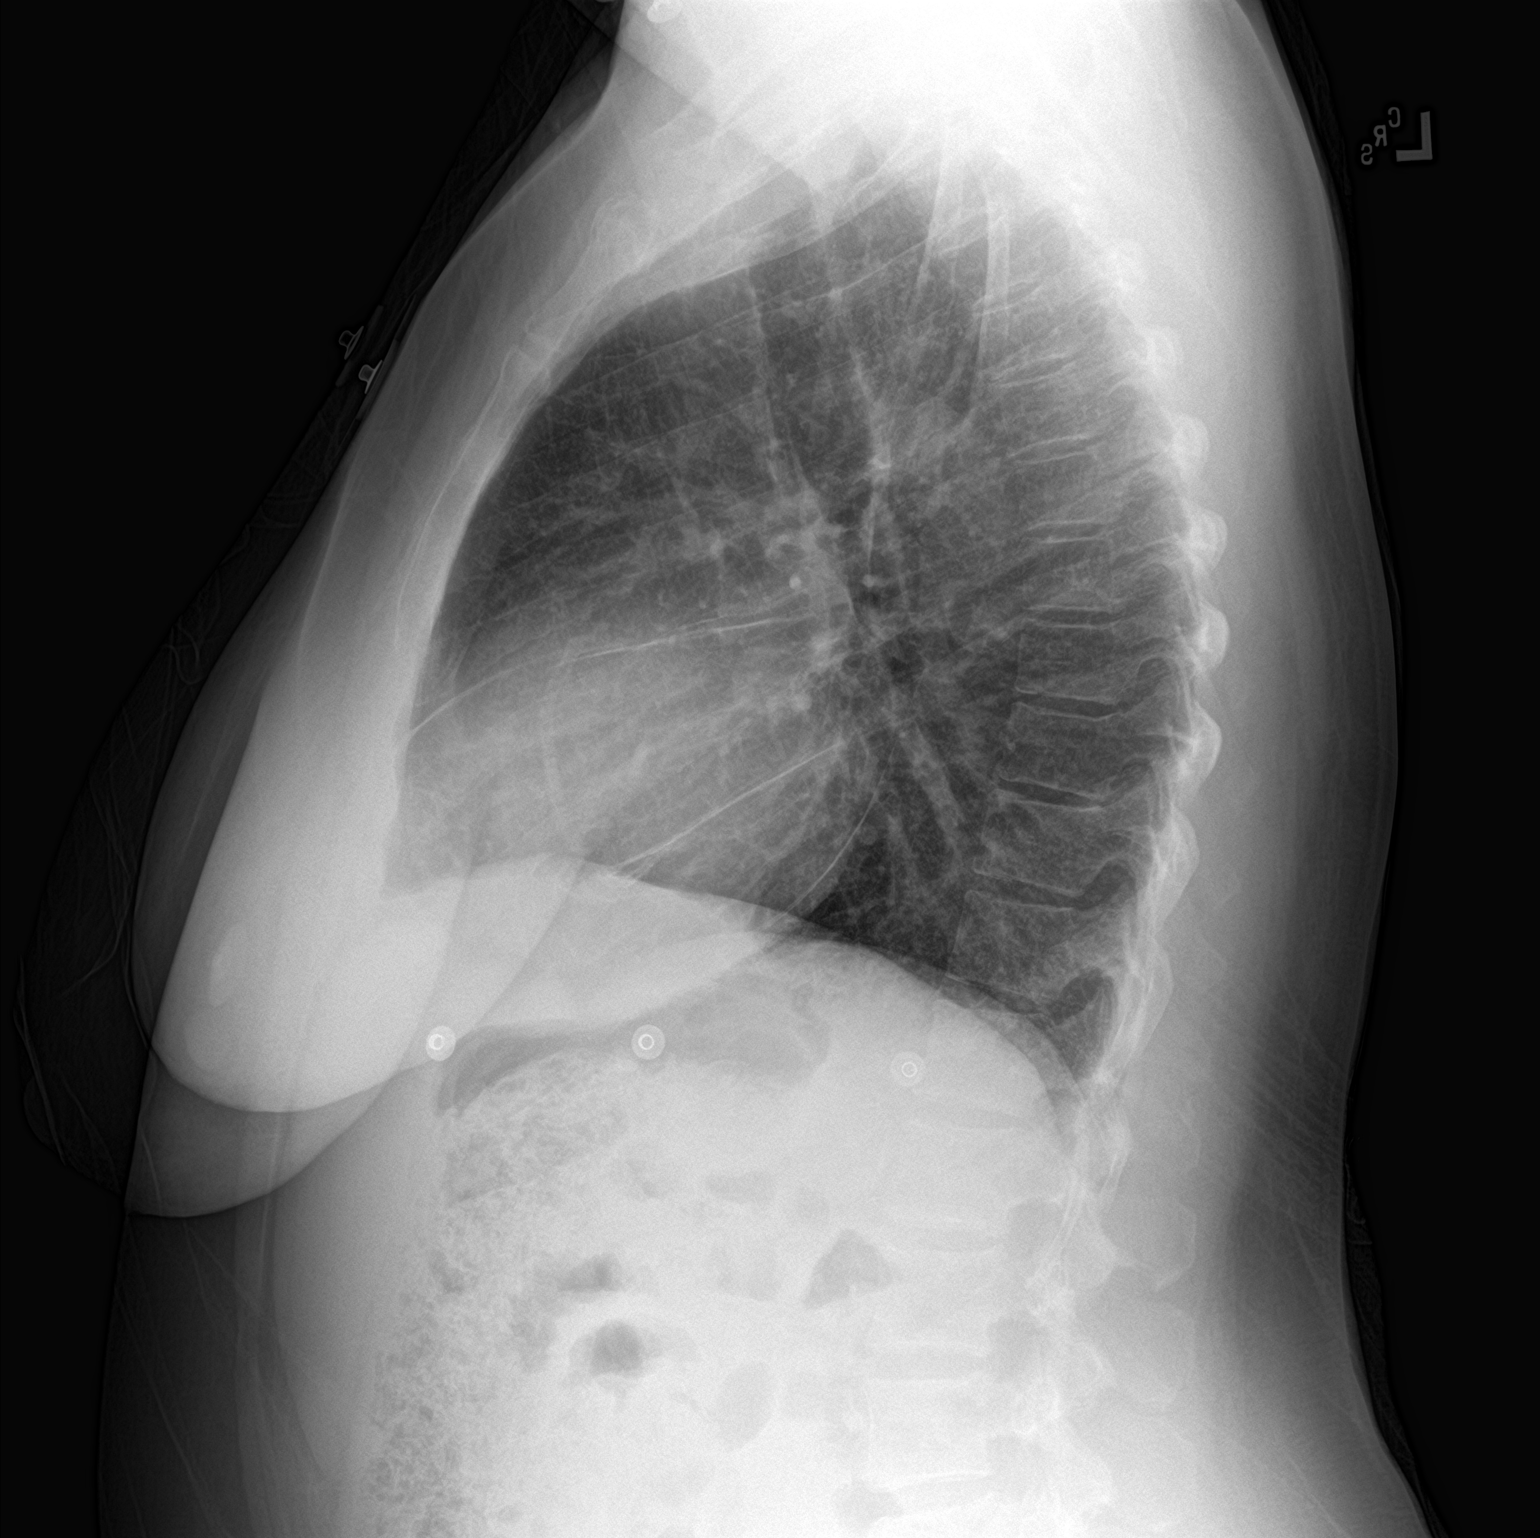

[2 of 2 positions shown; findings below may reference images not displayed]

FINDINGS: The heart size and mediastinal contours are within normal limits.
Right coronary stent. Both lungs are clear. The visualized skeletal
structures are unremarkable.
IMPRESSION: No active cardiopulmonary disease.

## 2021-03-14 NOTE — Telephone Encounter (Signed)
Results sent.

## 2021-03-20 ENCOUNTER — Other Ambulatory Visit: Payer: Self-pay | Admitting: Interventional Cardiology

## 2021-04-08 ENCOUNTER — Telehealth: Payer: Self-pay

## 2021-04-08 NOTE — Telephone Encounter (Signed)
Pt would like an appointment to have her finger looked at again. She is finished with her medication and issue continues. Finger has gotten better, but infection is still there.

## 2021-04-12 ENCOUNTER — Encounter: Payer: Self-pay | Admitting: Podiatry

## 2021-04-12 ENCOUNTER — Other Ambulatory Visit: Payer: Self-pay

## 2021-04-12 ENCOUNTER — Ambulatory Visit: Payer: Medicaid Other | Admitting: Podiatry

## 2021-04-12 DIAGNOSIS — M79674 Pain in right toe(s): Secondary | ICD-10-CM | POA: Diagnosis not present

## 2021-04-12 DIAGNOSIS — E1149 Type 2 diabetes mellitus with other diabetic neurological complication: Secondary | ICD-10-CM

## 2021-04-12 DIAGNOSIS — M79675 Pain in left toe(s): Secondary | ICD-10-CM | POA: Diagnosis not present

## 2021-04-12 DIAGNOSIS — B351 Tinea unguium: Secondary | ICD-10-CM | POA: Diagnosis not present

## 2021-04-12 DIAGNOSIS — L84 Corns and callosities: Secondary | ICD-10-CM

## 2021-04-12 MED ORDER — ITRACONAZOLE 100 MG PO CAPS
ORAL_CAPSULE | ORAL | 0 refills | Status: DC
Start: 2021-04-12 — End: 2021-05-12

## 2021-04-12 NOTE — Telephone Encounter (Signed)
I am not sure what happened--we were supposed to send in another Rx for the Itraconazole because the original Rx was only for 21 days and should have been for 42 days.  I thought the second 3 weeks was sent in previously?

## 2021-04-12 NOTE — Telephone Encounter (Signed)
Patient came in to get finger looked at. New itraconazole Rx for 42 days sent and will schedule hepatic profile.

## 2021-04-13 NOTE — Telephone Encounter (Signed)
Pt notified. Scheduled for hepatic profile 04/19/21

## 2021-04-17 ENCOUNTER — Other Ambulatory Visit: Payer: Self-pay | Admitting: Internal Medicine

## 2021-04-17 DIAGNOSIS — I10 Essential (primary) hypertension: Secondary | ICD-10-CM

## 2021-04-17 NOTE — Progress Notes (Signed)
Subjective: 55 y.o. returns the office today for painful, elongated, thickened toenails which she cannot trim herself.  She has a callus at the lower left foot that needs to be trimmed.  No open lesions. Denies any systemic complaints such as fevers, chills, nausea, vomiting.   PCP: Julieanne Manson, MD Endocrinologist: Dr. Talmage Coin, MD A1c: 6.7 on 08/12/2020  Objective: AAO 3, NAD DP/PT pulses palpable, CRT less than 3 seconds Sensation decreased with Semmes Weinstein monofilament. Nails hypertrophic, dystrophic, elongated, brittle, discolored 10. There is tenderness overlying the nails 1-5 bilaterally. There is no surrounding erythema or drainage along the nail sites. Hyperkeratotic lesion submetatarsal left foot without any underlying ulceration drainage or signs of infection. No open lesions/ulcerations Hammertoes present No pain with calf compression, swelling, warmth, erythema.  Assessment: Patient presents with symptomatic onychomycosis, hyperkeratotic lesion.  Plan: -Treatment options including alternatives, risks, complications were discussed -Nails sharply debrided 10 without complication/bleeding. -Sharply debrided hyperkeratotic lesion x1 without any complications or bleeding. -Discussed daily foot inspection. If there are any changes, to call the office immediately.  -Follow-up in 3 months or sooner if any problems are to arise. In the meantime, encouraged to call the office with any questions, concerns, changes symptoms.  Ovid Curd, DPM

## 2021-04-19 ENCOUNTER — Other Ambulatory Visit: Payer: Self-pay

## 2021-04-19 ENCOUNTER — Other Ambulatory Visit (INDEPENDENT_AMBULATORY_CARE_PROVIDER_SITE_OTHER): Payer: Medicaid Other

## 2021-04-19 DIAGNOSIS — Z79899 Other long term (current) drug therapy: Secondary | ICD-10-CM

## 2021-04-19 DIAGNOSIS — E559 Vitamin D deficiency, unspecified: Secondary | ICD-10-CM | POA: Diagnosis not present

## 2021-04-20 ENCOUNTER — Other Ambulatory Visit: Payer: Medicaid Other

## 2021-04-20 LAB — HEPATIC FUNCTION PANEL
ALT: 7 IU/L (ref 0–32)
AST: 17 IU/L (ref 0–40)
Albumin: 4.5 g/dL (ref 3.8–4.9)
Alkaline Phosphatase: 167 IU/L — ABNORMAL HIGH (ref 44–121)
Bilirubin Total: 0.4 mg/dL (ref 0.0–1.2)
Bilirubin, Direct: 0.13 mg/dL (ref 0.00–0.40)
Total Protein: 7.8 g/dL (ref 6.0–8.5)

## 2021-04-20 LAB — VITAMIN D 25 HYDROXY (VIT D DEFICIENCY, FRACTURES): Vit D, 25-Hydroxy: 33.1 ng/mL (ref 30.0–100.0)

## 2021-04-21 ENCOUNTER — Telehealth: Payer: Self-pay | Admitting: Internal Medicine

## 2021-04-21 NOTE — Telephone Encounter (Signed)
[name] [name]: [phone] Pt has not scheduled an appt with her counselor yet but reported she would soon.

## 2021-05-06 ENCOUNTER — Telehealth: Payer: Self-pay

## 2021-05-06 ENCOUNTER — Other Ambulatory Visit: Payer: Self-pay | Admitting: Internal Medicine

## 2021-05-06 ENCOUNTER — Encounter: Payer: Self-pay | Admitting: Internal Medicine

## 2021-05-06 NOTE — Telephone Encounter (Signed)
Pt called asking for a letter saying that she needs a support dog. The letter would be for her landlord to allow the dog

## 2021-05-06 NOTE — Telephone Encounter (Signed)
Picked up letter with dr Delrae Alfred

## 2021-05-12 ENCOUNTER — Ambulatory Visit (INDEPENDENT_AMBULATORY_CARE_PROVIDER_SITE_OTHER): Payer: Medicaid Other | Admitting: Internal Medicine

## 2021-05-12 ENCOUNTER — Encounter: Payer: Self-pay | Admitting: Internal Medicine

## 2021-05-12 ENCOUNTER — Other Ambulatory Visit: Payer: Self-pay

## 2021-05-12 VITALS — BP 136/68 | HR 84 | Resp 24 | Ht 61.0 in | Wt 177.0 lb

## 2021-05-12 DIAGNOSIS — B351 Tinea unguium: Secondary | ICD-10-CM

## 2021-05-12 DIAGNOSIS — Z720 Tobacco use: Secondary | ICD-10-CM | POA: Diagnosis not present

## 2021-05-12 DIAGNOSIS — Z716 Tobacco abuse counseling: Secondary | ICD-10-CM | POA: Diagnosis not present

## 2021-05-12 DIAGNOSIS — J069 Acute upper respiratory infection, unspecified: Secondary | ICD-10-CM

## 2021-05-12 DIAGNOSIS — E118 Type 2 diabetes mellitus with unspecified complications: Secondary | ICD-10-CM

## 2021-05-12 MED ORDER — ITRACONAZOLE 100 MG PO CAPS: ORAL_CAPSULE | ORAL | 0 refills | Status: DC

## 2021-05-12 NOTE — Progress Notes (Signed)
Subjective:    Patient ID: [name] [name], female   DOB: [birth year], 55 y.o.   MRN: [id number]   HPI   Tobacco Abuse:  Stopped nicotine nasal spray about 1 to 2 weeks ago as she broke her partial and she started to be able to taste the nicotine taste.  Not clear if that was more because she was developing a URI as now with congestion and drainage.  Picking up her new partial tomorrow.  2.  Congestion:  Started about 10 days ago when partial broke.  Lot of posterior pharyngeal drainage.  Not much of a cough, but when she brings mucous up, it is white.  No fever.  No dyspnea.  Has tried alka seltzer cold and vapo rub on her chest and vaporizer--all help.  Drinking lots of fluids and her sugars are still in a good range--generally in low 100s, though [place] of 191 after drinking a gatorade.   Covid testing negative 2 days ago--home test.    3.  Left ring fingernail.  Skin around nail is improved, but nail still very discolored.  Last dose of Itraconazole was today.  Was not taking 2 caps at one time, taking 1 twice daily.    Current Meds  Medication Sig   Accu-Chek FastClix Lancets MISC CHECK BLOOD SUGAR 5 TIMES DAILY   ACCU-CHEK GUIDE test strip USE AS DIRECTED CHECK BLOOD SUGAR 5 TIMES A DAY   acetaminophen (TYLENOL) 500 MG tablet Take 500-1,000 mg by mouth every 6 (six) hours as needed for moderate pain.    albuterol (PROVENTIL HFA;VENTOLIN HFA) 108 (90 Base) MCG/ACT inhaler Inhale 2 puffs into the lungs every 6 (six) hours as needed for wheezing or shortness of breath.   ammonium lactate (AMLACTIN) 12 % cream Apply topically as needed for dry skin.   Blood Glucose Monitoring Suppl (ACCU-CHEK GUIDE ME) w/Device KIT USE TO MONITOR BLOOD SUGAR FIVE TIMES A DAY DX  E10.22 IN VITRO   clopidogrel (PLAVIX) 75 MG tablet Take 75 mg by mouth daily.   Continuous Blood Gluc Receiver (DEXCOM G6 RECEIVER) DEVI AS DIRECTED 364 DAYS   Continuous Blood Gluc Sensor (FREESTYLE LIBRE 2 SENSOR) MISC Apply topically  every other day.   Continuous Blood Gluc Transmit (DEXCOM G6 TRANSMITTER) MISC AS DIRECTED 90 DAYS   [employer] D3 25 MCG (1000 UT) capsule TAKE 1 CAPSULE BY MOUTH EVERY DAY   cyclobenzaprine (FLEXERIL) 10 MG tablet Take 1-2 tablets (10-20 mg total) by mouth at bedtime as needed for muscle spasms. Muscle spasms   diphenhydrAMINE (BENADRYL) 25 MG tablet Take 25 mg by mouth every 6 (six) hours as needed for itching or allergies.   famotidine (PEPCID) 20 MG tablet Take 20 mg by mouth 2 (two) times daily.   folic acid (FOLVITE) 127 MCG tablet Take 1,200 mcg by mouth daily.   furosemide (LASIX) 40 MG tablet TAKE 1 TABLET BY MOUTH EVERY DAY IN THE MORNING   gabapentin (NEURONTIN) 100 MG capsule TAKE 1 CAPSULE BY MOUTH AT BEDTIME.   Incontinence Supply Disposable (POISE ULTRA THINS) PADS Use 5 pads daily as needed for urinary stress incontinence   insulin aspart (NOVOLOG) 100 UNIT/ML injection Inject 4-16 Units into the skin 3 (three) times daily with meals. Per Sliding scale CBG 100-150: 4 units, 151-200: 6 units, 201-250: 8 units, 251-300: 10 units, 301-350: 12 units, 351-400: 14 units, > 401; call doctor   LANTUS SOLOSTAR 100 UNIT/ML Solostar Pen Inject 10 Units into the skin 2 (two) times daily.  methotrexate (RHEUMATREX) 2.5 MG tablet Take 10 mg by mouth every Wednesday.   metoprolol tartrate (LOPRESSOR) 25 MG tablet TAKE 1 TABLET BY MOUTH TWICE A DAY   nitroGLYCERIN (NITROSTAT) 0.4 MG SL tablet Place 1 tablet (0.4 mg total) under the tongue every 5 (five) minutes as needed for chest pain. Max 3 doses, call 911.   ondansetron (ZOFRAN) 4 MG tablet TAKE 1 TABLET BY MOUTH EVERY 6 HOURS   pantoprazole (PROTONIX) 40 MG tablet Take 40 mg by mouth daily.   prednisoLONE acetate (PRED FORTE) 1 % ophthalmic suspension Place 4-5 drops into the left eye daily.   rosuvastatin (CRESTOR) 40 MG tablet TAKE 1 TABLET BY MOUTH EVERY DAY   sertraline (ZOLOFT) 100 MG tablet Take 150 mg by mouth daily.   traMADol (ULTRAM) 50  MG tablet TAKE 1 TABLET EVERY 6 HOURS AS NEEDED FOR MODERATE PAIN   Allergies  Allergen Reactions   Aspirin Other (See Comments)    Stomach bleeds      Review of Systems    Objective:   BP 136/68 (BP Location: Right Arm, Patient Position: Sitting, Cuff Size: Normal)   Pulse 84   Resp (!) 24   Ht '5\' 1"'  (1.549 m)   Wt 177 lb (80.3 kg)   LMP 01/12/2015   BMI 33.44 kg/m   Physical Exam NAD HEENT:   PERRL, EOMI, conjunctivae without injection.  TMs pearly gray.  Nasal mucosa mildly swollen with clear discharge.  Mild tenderness over maxillary sinus areas.   Throat without injection Neck:  Supple, No adenopathy Chest:  CTA CV:  RRR without murmur or rub.  Radial  pulses normal and equal Abd:  S, NT, No HSM or mass, + BS Left ring fingernail:  no longer with inflammation about nail bed.  Still with orange brown discoloration, but perhaps a bit of normal nail growing out from base.     Assessment & Plan    Tobacco abuse:  hold until airways normal before restart of nicotine nasal spray.    2.  URI:  to continue symptomatic treatment as long as improving.  To call if still congested Monday.    3.  Left ring finger onychomycosis/candidal species infection on culture:   will treat with one more round of Itraconazole.  Follow up in 4 weeks.  Went over how to take the Itraconazole again and to not miss doses.  Hepatic profile fine.  4.  DM:  stable.  5.  HM:  not due for COVID bivalent booster until at least Oct. 25th.  6.  Vitamin D deficiency:  now in normal range.  Continue Vitamin D for now as just in normal range.

## 2021-05-12 NOTE — Patient Instructions (Addendum)
Call if not improving by Monday or if worsening.  Wait to restart nicotine nasal spray until no more congestion.  Continue to hold Pantoprazole.

## 2021-05-17 ENCOUNTER — Encounter: Payer: Self-pay | Admitting: Internal Medicine

## 2021-05-20 ENCOUNTER — Other Ambulatory Visit: Payer: Self-pay

## 2021-05-20 ENCOUNTER — Other Ambulatory Visit: Payer: Medicaid Other

## 2021-05-20 DIAGNOSIS — Z79899 Other long term (current) drug therapy: Secondary | ICD-10-CM

## 2021-05-21 LAB — BASIC METABOLIC PANEL
BUN/Creatinine Ratio: 17 (ref 9–23)
BUN: 19 mg/dL (ref 6–24)
CO2: 24 mmol/L (ref 20–29)
Calcium: 10 mg/dL (ref 8.7–10.2)
Chloride: 103 mmol/L (ref 96–106)
Creatinine, Ser: 1.13 mg/dL — ABNORMAL HIGH (ref 0.57–1.00)
Glucose: 127 mg/dL — ABNORMAL HIGH (ref 70–99)
Potassium: 5 mmol/L (ref 3.5–5.2)
Sodium: 140 mmol/L (ref 134–144)
eGFR: 58 mL/min/{1.73_m2} — ABNORMAL LOW (ref >59)

## 2021-06-04 ENCOUNTER — Other Ambulatory Visit: Payer: Self-pay | Admitting: Internal Medicine

## 2021-06-06 NOTE — Telephone Encounter (Signed)
Please call [name] [name] and find out what she is doing with this med.  Filled for 2 caps by mouth once daily for 28 days.  Not clear why sending in another refill request.  She should have an appt. Soon to see if need to extend.

## 2021-06-08 NOTE — Telephone Encounter (Signed)
Confirmed with pt that she did not request this refill. Pharmacy might have requested it

## 2021-06-18 ENCOUNTER — Other Ambulatory Visit: Payer: Self-pay | Admitting: Internal Medicine

## 2021-06-27 ENCOUNTER — Telehealth: Payer: Self-pay | Admitting: Internal Medicine

## 2021-06-27 MED ORDER — AMOXICILLIN-POT CLAVULANATE 875-125 MG PO TABS: ORAL_TABLET | ORAL | 0 refills | Status: DC

## 2021-06-27 NOTE — Telephone Encounter (Signed)
Pt called earlier with dog bite to hand 2 days ago now with swelling.  Not clear if redness.  States did not break skin. Not clear if fever. Dog has all its vaccinations, including rabies  Unable to get her in today--sending in Augmentin for 7 days and will try to get her in tomorrow.

## 2021-06-27 NOTE — Telephone Encounter (Signed)
Patient called to report dog bite on he right hand Saturday 06/25/21. Bite did not pierce her skin but swelling started 06/26/21. Experiencing pain in the swollen area. No other symptoms

## 2021-06-28 NOTE — Telephone Encounter (Signed)
Patient was notified of Rx.  ?

## 2021-07-03 ENCOUNTER — Other Ambulatory Visit: Payer: Self-pay | Admitting: Internal Medicine

## 2021-07-06 ENCOUNTER — Telehealth: Payer: Self-pay

## 2021-07-06 ENCOUNTER — Telehealth: Payer: Self-pay | Admitting: Internal Medicine

## 2021-07-06 MED ORDER — VITAMIN D3 10 MCG (400 UNIT) PO TABS: ORAL_TABLET | ORAL | 3 refills | Status: DC

## 2021-07-06 NOTE — Telephone Encounter (Signed)
Patient has been notified of Rx  

## 2021-07-06 NOTE — Telephone Encounter (Signed)
Patient would like an appt to discuss continued finger nail infection. After completing the medication, there has been no changes to her nail.

## 2021-07-07 NOTE — Telephone Encounter (Signed)
Pt also called to notify that someone from [facility] [facility] called her to let her know that her chart indicates that she has stage 3 chronic kidney disease. She was not given much else information. She would like an appointment to discuss.

## 2021-07-11 ENCOUNTER — Telehealth: Payer: Self-pay | Admitting: *Deleted

## 2021-07-11 NOTE — Telephone Encounter (Signed)
   Pre-operative Risk Assessment    Patient Name: [name] [name]  DOB: 12/10/1965 MRN: [id number]      Request for Surgical Clearance    Procedure:   COLONOSCOPY/ENDOSCOPY  Date of Surgery:  Clearance 10/13/21                                 Surgeon:  DR. [facility] Surgeon's Group or Practice Name:  [facility] Phone number:  [phone] Fax number:  [phone]   Type of Clearance Requested:   - Medical  - Pharmacy:  Hold Clopidogrel (Plavix) PLAVIX x 5 DAYS PRIOR   Type of Anesthesia:   PROPOFOL   Additional requests/questions:    Elpidio Anis   07/11/2021, 1:57 PM

## 2021-07-11 NOTE — Telephone Encounter (Signed)
Pt called to report that her finger seeems to be worsening. Nail color has gotten darker and appears to be nearly falling off.

## 2021-07-12 ENCOUNTER — Other Ambulatory Visit: Payer: Self-pay

## 2021-07-12 ENCOUNTER — Ambulatory Visit (INDEPENDENT_AMBULATORY_CARE_PROVIDER_SITE_OTHER): Payer: Medicaid Other | Admitting: Podiatry

## 2021-07-12 DIAGNOSIS — B351 Tinea unguium: Secondary | ICD-10-CM

## 2021-07-12 DIAGNOSIS — L84 Corns and callosities: Secondary | ICD-10-CM

## 2021-07-12 DIAGNOSIS — M79675 Pain in left toe(s): Secondary | ICD-10-CM | POA: Diagnosis not present

## 2021-07-12 DIAGNOSIS — M79674 Pain in right toe(s): Secondary | ICD-10-CM | POA: Diagnosis not present

## 2021-07-12 DIAGNOSIS — E1149 Type 2 diabetes mellitus with other diabetic neurological complication: Secondary | ICD-10-CM

## 2021-07-12 MED ORDER — CICLOPIROX 8 % EX SOLN: Freq: Every day | CUTANEOUS | 2 refills | Status: DC

## 2021-07-12 NOTE — Telephone Encounter (Signed)
° °  Name: Samariya Rockhold  DOB: [birth year]  MRN: [id number]  Primary Cardiologist: Lance Muss, MD  Chart reviewed as part of pre-operative protocol coverage. Because of Buffy Mckenzie's past medical history and time since last visit, she will require a follow-up visit in order to better assess preoperative cardiovascular risk.  Pre-op covering staff: - Please schedule appointment and call patient to inform them. If patient already had an upcoming appointment within acceptable timeframe, please add "pre-op clearance" to the appointment notes so provider is aware. - Please contact requesting surgeon's office via preferred method (i.e, phone, fax) to inform them of need for appointment prior to surgery.  If applicable, this message will also be routed to pharmacy pool and/or primary cardiologist for input on holding anticoagulant/antiplatelet agent as requested below so that this information is available to the clearing provider at time of patient's appointment.   GI procedure is scheduled for March 2023, she is due for follow up in early March 2023 as well. Please arrange follow up with Dr. Eldridge Dace at least 5 days or longer prior to her GI procedure either in Feb or early March.   [place], [place]  07/12/2021, 10:48 PM

## 2021-07-12 NOTE — Telephone Encounter (Signed)
Pt scheduled  

## 2021-07-13 NOTE — Telephone Encounter (Signed)
Pt has been scheduled to see Dr. Eldridge Dace, 09/29/2021, and clearance will be addressed at that time.  Will route back to the requesting surgeon's office to make them aware.

## 2021-07-14 ENCOUNTER — Encounter: Payer: Self-pay | Admitting: Internal Medicine

## 2021-07-14 ENCOUNTER — Other Ambulatory Visit: Payer: Self-pay

## 2021-07-14 ENCOUNTER — Ambulatory Visit (INDEPENDENT_AMBULATORY_CARE_PROVIDER_SITE_OTHER): Payer: Medicaid Other | Admitting: Internal Medicine

## 2021-07-14 VITALS — BP 130/62 | HR 80 | Resp 20 | Ht 61.0 in | Wt 178.0 lb

## 2021-07-14 DIAGNOSIS — B351 Tinea unguium: Secondary | ICD-10-CM | POA: Diagnosis not present

## 2021-07-14 DIAGNOSIS — Z5986 Financial insecurity: Secondary | ICD-10-CM | POA: Diagnosis not present

## 2021-07-14 DIAGNOSIS — Z639 Problem related to primary support group, unspecified: Secondary | ICD-10-CM
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
## 2021-07-15 NOTE — Progress Notes (Signed)
Subjective: 55 y.o. returns the office today for painful, elongated, thickened toenails which she cannot trim herself.  She has a callus at the lower left foot that needs to be trimmed.  No open lesions.  No swelling or any open lesions or any drainage noted.  Denies any fevers or chills.  No other concerns.   PCP: Julieanne Manson, MD Endocrinologist: Dr. Talmage Coin, MD A1c: 6.7 on 08/12/2020 (patient reports last check was 6.4 with last BS of 89)  Objective: AAO 3, NAD DP/PT pulses palpable, CRT less than 3 seconds Sensation decreased with Semmes Weinstein monofilament. Nails hypertrophic, dystrophic, elongated, brittle, discolored 10. There is tenderness overlying the nails 1-5 bilaterally. There is no surrounding erythema or drainage along the nail sites. Hyperkeratotic lesion submetatarsal left foot without any underlying ulceration drainage or signs of infection. No open lesions or ulcerations Hammertoes present No pain with calf compression, swelling, warmth, erythema.  Assessment: Patient presents with symptomatic onychomycosis, hyperkeratotic lesion.  Plan: -Treatment options including alternatives, risks, complications were discussed -Nails sharply debrided 10 without complication/bleeding. -Sharply debrided hyperkeratotic lesion x1 without any complications or bleeding. -Discussed daily foot inspection. If there are any changes, to call the office immediately.  -Follow-up in 3 months or sooner if any problems are to arise. In the meantime, encouraged to call the office with any questions, concerns, changes symptoms.  Ovid Curd, DPM

## 2021-07-19 ENCOUNTER — Other Ambulatory Visit: Payer: Self-pay | Admitting: Internal Medicine

## 2021-07-21 ENCOUNTER — Other Ambulatory Visit: Payer: Medicaid Other

## 2021-07-29 ENCOUNTER — Telehealth: Payer: Self-pay
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
## 2021-07-29 NOTE — Telephone Encounter (Signed)
Pt call after experiencing nose bleeds after getting home after MRI on 07/27/21 and Xray  07/28/21. Nose bleed was for about and used a warm rag over her face to stop it. Pt also had a headache when nose started bleeding. Took tylenol for the headache. Wants to know if it is something she should worry about

## 2021-08-16 ENCOUNTER — Telehealth: Payer: Self-pay

## 2021-08-16 DIAGNOSIS — B351 Tinea unguium: Secondary | ICD-10-CM

## 2021-08-16 NOTE — Telephone Encounter (Signed)
Pt called asking for a referral to a different dermatologist for her finger nail. Previous had a referral sent to [facility], but they are currently not taking any new medicaid patients

## 2021-08-17 ENCOUNTER — Other Ambulatory Visit: Payer: Self-pay

## 2021-08-17 ENCOUNTER — Other Ambulatory Visit (INDEPENDENT_AMBULATORY_CARE_PROVIDER_SITE_OTHER): Payer: Medicaid Other

## 2021-08-17 DIAGNOSIS — R0989 Other specified symptoms and signs involving the circulatory and respiratory systems: Secondary | ICD-10-CM

## 2021-08-17 LAB — POCT INFLUENZA A/B
Influenza A, POC: NEGATIVE
Influenza B, POC: NEGATIVE

## 2021-08-17 LAB — POC COVID19 BINAXNOW: SARS Coronavirus 2 Ag: NEGATIVE

## 2021-08-17 NOTE — Progress Notes (Signed)
Patient came in for COVID and influenza test after experiencing chest congestion for about 2 weeks. Has been taking Theraflu for about a week which has helped, but is concerned that the congestion has lasted this long.  COVID and influenza test was negative. Patient was notified of results

## 2021-08-22 IMAGING — MG MM DIGITAL SCREENING BILAT W/ TOMO AND CAD
8 series · 8 of 24 positions shown · non-contrast
Comparison: Previous exam(s).

CLINICAL DATA: Screening.

EXAM:
DIGITAL SCREENING BILATERAL MAMMOGRAM WITH TOMOSYNTHESIS AND CAD
TECHNIQUE: Bilateral screening digital craniocaudal and mediolateral oblique
mammograms were obtained. Bilateral screening digital breast
tomosynthesis was performed. The images were evaluated with
computer-aided detection.

[R MLO synth-2D]
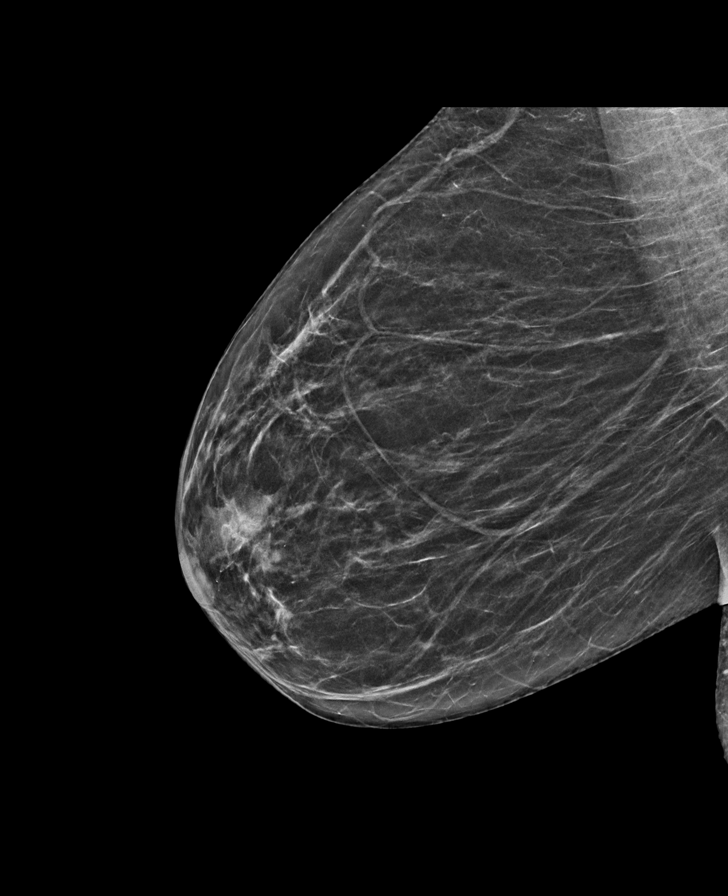

[R CC synth-2D]
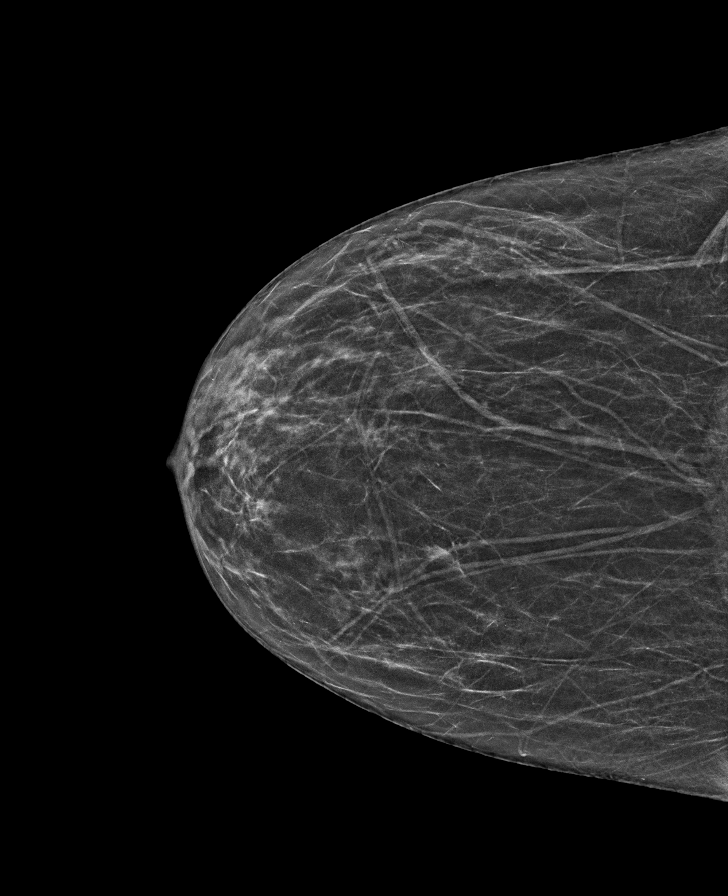

[L CC synth-2D]
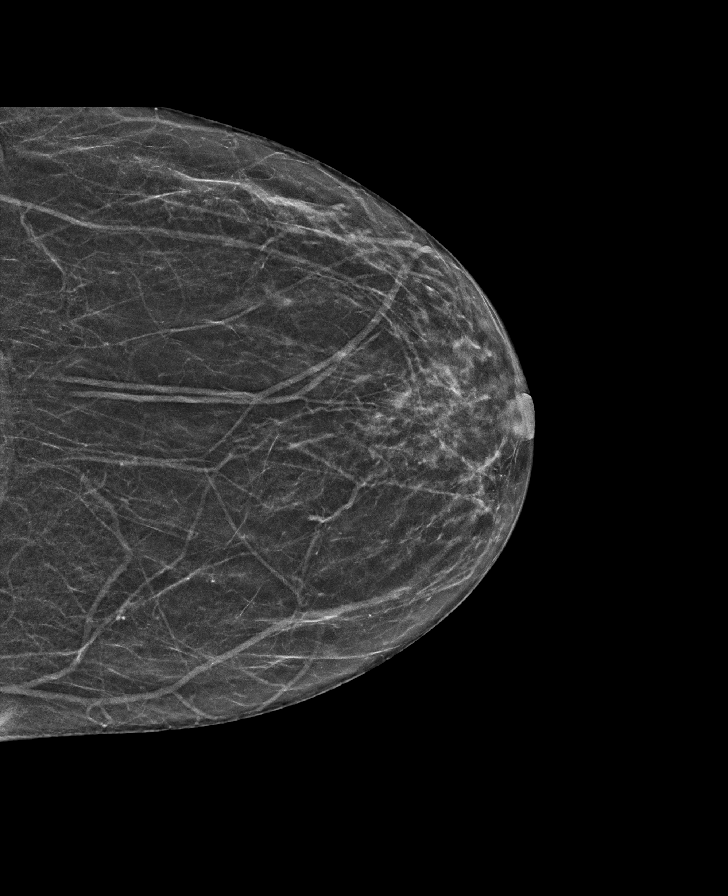

[L MLO synth-2D]
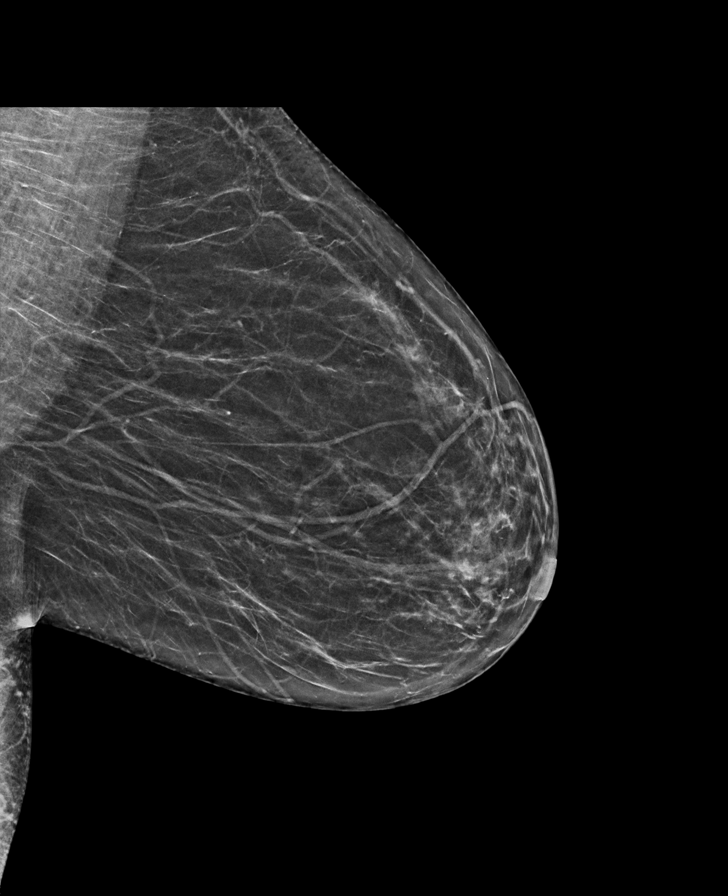

[R CC tomo · tomo slice 27/53.0]
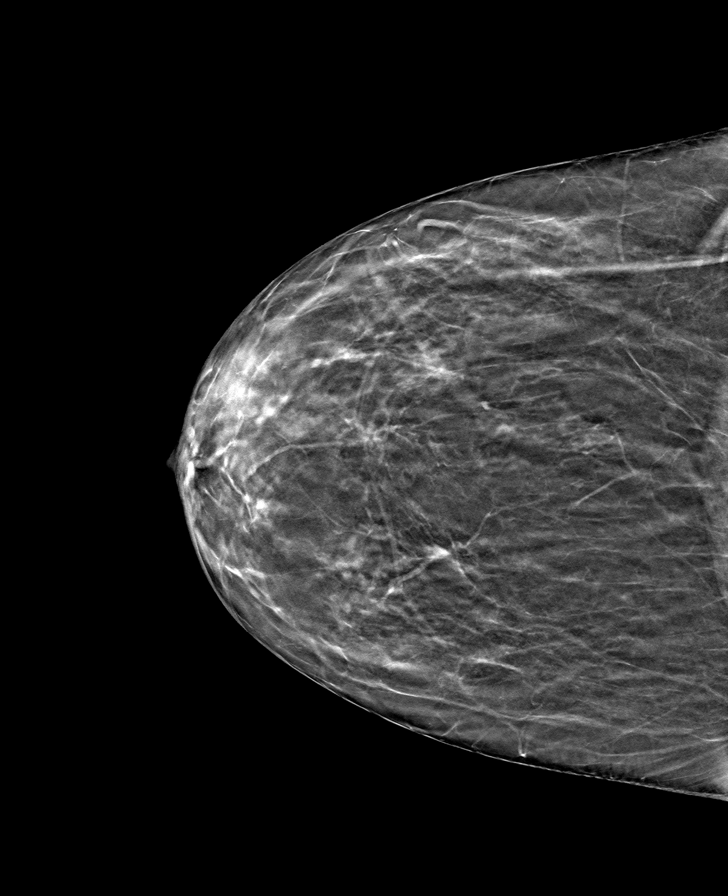

[R MLO tomo · tomo slice 29/57.0]
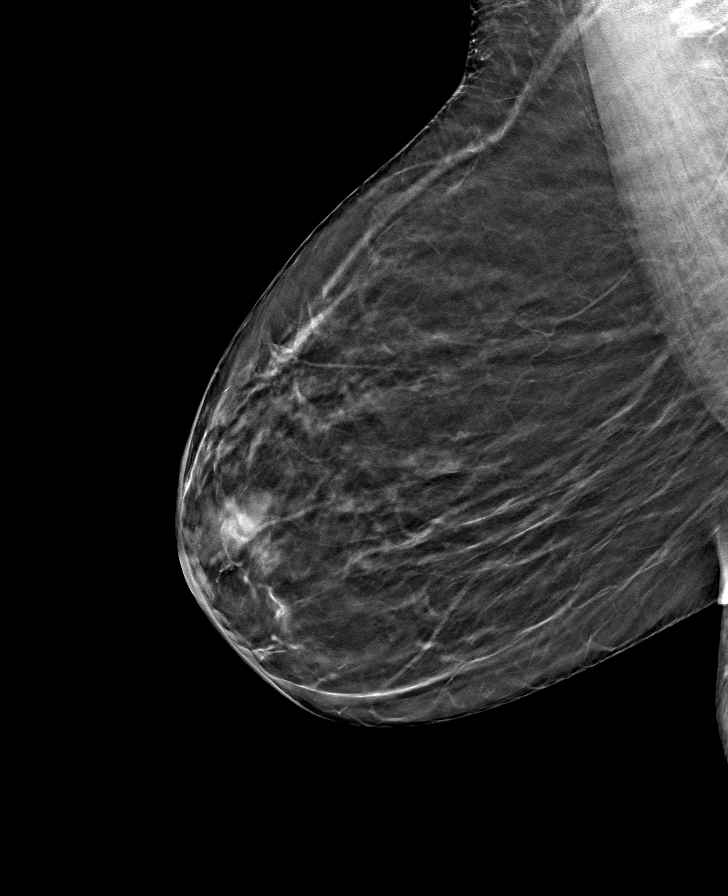

[L MLO tomo · tomo slice 31/61.0]
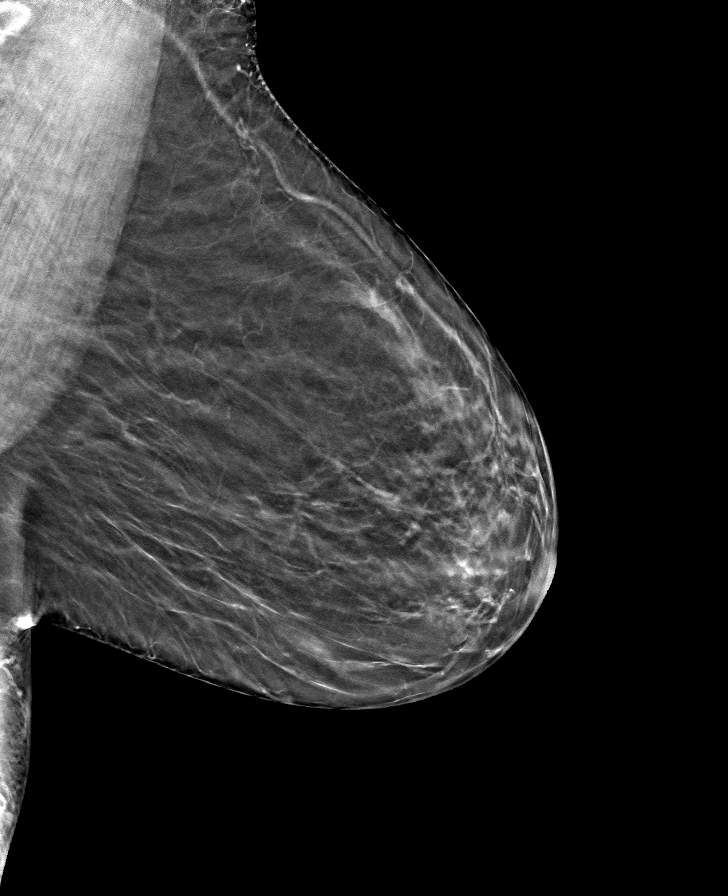

[L CC tomo · tomo slice 27/52.0]
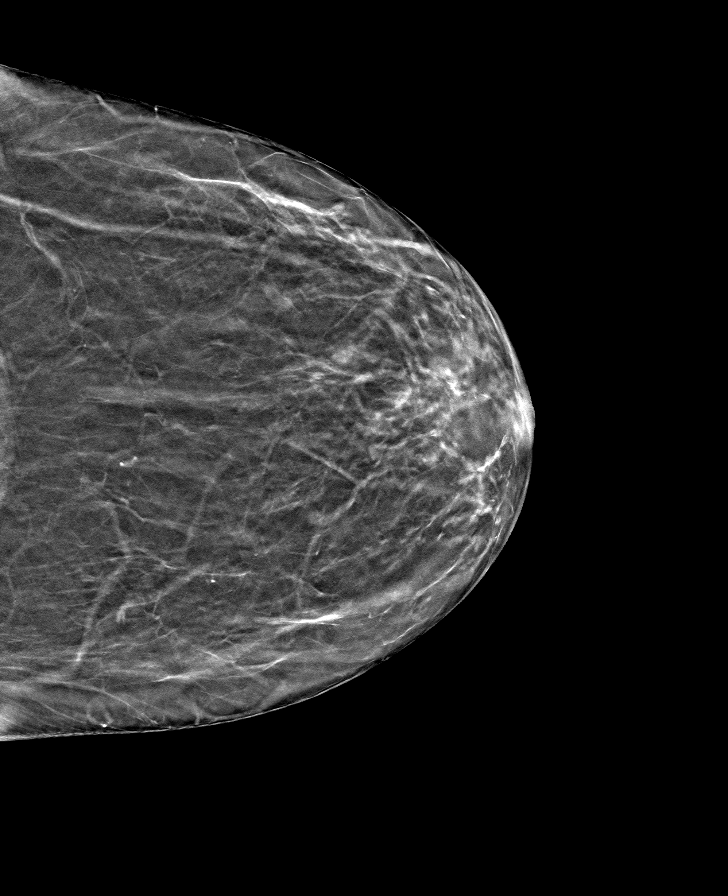

[8 of 24 positions shown; findings below may reference images not displayed]

ACR Breast Density Category b: There are scattered areas of
fibroglandular density.
FINDINGS: There are no findings suspicious for malignancy.
IMPRESSION: No mammographic evidence of malignancy. A result letter of this
screening mammogram will be mailed directly to the patient.

RECOMMENDATION:
Screening mammogram in one year. (Code:51-O-LD2)

BI-RADS CATEGORY  1: Negative.

## 2021-08-25 NOTE — Telephone Encounter (Signed)
Patients new referral to dermatology was not approved as they are not taking new medicaid patients

## 2021-08-30 ENCOUNTER — Telehealth: Payer: Self-pay | Admitting: *Deleted

## 2021-08-30 ENCOUNTER — Other Ambulatory Visit: Payer: Self-pay | Admitting: Interventional Cardiology

## 2021-08-30 NOTE — Telephone Encounter (Signed)
[place], MD  Chart reviewed as part of pre-operative protocol coverage. Because of [name] [name]'s past medical history and time since last visit, he/she will require a follow-up visit in order to better assess preoperative cardiovascular risk.  Pre-op covering staff: - Please schedule appointment and call patient to inform them. - Please contact requesting surgeon's office via preferred method (i.e, phone, fax) to inform them of need for appointment prior to surgery.  If applicable, this message will also be routed to pharmacy pool and/or primary cardiologist for input on holding anticoagulant/antiplatelet agent as requested below so that this information is available at time of patient's appointment.   Deberah Pelton, NP  08/30/2021, 11:35 AM

## 2021-08-30 NOTE — Telephone Encounter (Signed)
Pt has been scheduled to see Jacolyn ReedyMichele Lenze, PA-C, 09/14/21, clearance will be addressed at that time.  Will route back to the requesting surgeon's office to make them aware.

## 2021-08-30 NOTE — Telephone Encounter (Signed)
° °  Pre-operative Risk Assessment    Patient Name: [name] [name]  DOB: 04-14-1966 MRN: [id number]      Request for Surgical Clearance    Procedure:   FACET INJECTIONS  Date of Surgery:  Clearance TBD                                 Surgeon:  DR. Danella Penton Surgeon's Group or Practice Name:  [facility] Phone number:  Fax number:  [phone]   Type of Clearance Requested:   - Medical    Type of Anesthesia:   ORAL   Additional requests/questions:   Wilhemina Cash   08/30/2021, 11:06 AM ]

## 2021-09-06 ENCOUNTER — Other Ambulatory Visit: Payer: Self-pay | Admitting: Interventional Cardiology

## 2021-09-06 ENCOUNTER — Other Ambulatory Visit: Payer: Self-pay | Admitting: Internal Medicine

## 2021-09-07 NOTE — Progress Notes (Signed)
Cardiology Office Note    Date:  09/14/2021   ID:  [name] [name], DOB 1965-09-07, MRN [id number]   PCP:  Mack Hook, [place]  Cardiologist:  Larae Grooms, MD   Advanced Practice Provider:  No care team member to display Electrophysiologist:  None   [phone]   No chief complaint on file.   History of Present Illness:  [name] [name] is a 56 y.o. female with history of CAD inf MI 2010 stent RCA, NSTEMI 11/2012 DES Cfx. Plavix stopped before completing 12 month course post MI b/c of bleeding issues after eye surgery. Chronic chest pain 2020, 2021 after falling off bed.  Cath 07/2020 status post DES to the mid LAD, patent stent to the circumflex, severe in-stent restenosis of the RCA with 70% proximal disease and total occlusion of the mid and distal segment.  Plan was for aspirin and Plavix for 1 month followed by indefinite clopidogrel 75 mg daily.  Preop clearance for Facet Injections by Dr. Hulen Luster and colonoscopy/endoscopy by [facility]. Complaining of a lot of indigestion. Sharp pain in chest, food stops in center of her chest. Took 2 NTG last Friday for sharp pain without relief. Alka selter helped ease. On a lot of pain meds since accident last August for back and neck pain. She walks her dogs 1/2 mile daily which also helps the sharp chest pain. Has gained 20 lbs. Since here last. No chest pain like when she had her stents. Smokes 1/2 ppd. Called Quit smoking help line.   Past Medical History:  Diagnosis Date   Acute myocardial infarction of other lateral wall, initial episode of care    Acute myocardial infarction, unspecified site, initial episode of care    Acute osteomyelitis    Allergic rhinitis    Anemia    Anginal pain ([place])    07/15/13- no chest pain in months"   Anxiety    Bipolar affective ([place])    CAD (coronary artery disease) 07/2011   s/p DES mid and distal RCA with 50% LAD   CKD stage 1 due to type 2 diabetes mellitus (HCC)     Daily headache    not daily   Depression    Bipolar disorder   Diabetic foot ulcer associated with diabetes mellitus due to underlying condition ([place]) 09/26/2018   Diabetic gastroparesis associated with type 2 diabetes mellitus ([place])    Diabetic peripheral neuropathy associated with type 2 diabetes mellitus ([place])    Diabetic retinopathy    Esophageal stenosis    Esophageal ulcer    Esophagitis    Gastroparesis    Genital herpes    Reportedly tested and documented by [facility]--rare occurrences   GERD (gastroesophageal reflux disease)    Heart murmur    History of stomach ulcers    Hyperlipidemia    Hypertension    Inferior MI ([place]) 01/20/2009   Archie Endo on 12/19/2012, "that's the only one I've had" (12/19/2012)   Migraines    Onychomycosis 02/07/2021   Orthopnoea    Paronychia of finger, left 02/07/2021   Pneumonia 2012   Polysubstance abuse ([place])    Crack cocaine--none since 2008, MJ, ETOH:  clean of all since 2008   Renal insufficiency    Renal lithiasis    Bilateral   Rheumatoid arthritis(714.0)    Sebaceous cyst    Stroke [facility]) 2011   denies residual on 12/19/2012.  "Years ago"   Type II diabetes mellitus ([place])  Previously uncontrolled for many years with multiple complications.  2017 controlled. 05/2016:  6.7%   Vitamin D deficiency     Past Surgical History:  Procedure Laterality Date   CORONARY ANGIOPLASTY WITH STENT PLACEMENT  01/20/2009   "2" (12/19/2012)   CORONARY ANGIOPLASTY WITH STENT PLACEMENT  2012   "2" (12/19/2012)   CORONARY ANGIOPLASTY WITH STENT PLACEMENT  12/19/2012   "2" (12/19/2012)   CORONARY STENT INTERVENTION N/A 08/13/2020   Procedure: CORONARY STENT INTERVENTION;  Surgeon: Nelva Bush, MD;  Location: [place] [facility];  Service: Cardiovascular;  Laterality: N/A;   ESOPHAGOGASTRODUODENOSCOPY N/A 02/26/2015   Procedure: ESOPHAGOGASTRODUODENOSCOPY (EGD);  Surgeon: Teena Irani, MD;  Location: [facility] ENDOSCOPY;  Service: Endoscopy;  Laterality:  N/A;   EYE SURGERY     Multiple surgeries of both eyes:  last laser was 09/08/2015 of right eye.  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   GAS INSERTION Left 07/16/2013   Procedure: INSERTION OF GAS;  Surgeon: Adonis Brook, MD;  Location: [place];  Service: Ophthalmology;  Laterality: Left;  SF6   GAS/FLUID EXCHANGE Left 07/30/2013   Procedure: GAS/FLUID EXCHANGE;  Surgeon: Adonis Brook, MD;  Location: [place];  Service: Ophthalmology;  Laterality: Left;   INTRAVASCULAR PRESSURE WIRE/FFR STUDY N/A 08/13/2020   Procedure: INTRAVASCULAR PRESSURE WIRE/FFR STUDY;  Surgeon: Nelva Bush, MD;  Location: [place] [facility];  Service: Cardiovascular;  Laterality: N/A;   IRRIGATION AND DEBRIDEMENT SEBACEOUS CYST Right 03/2011   "pointer" (12/19/2012)   LEFT HEART CATH AND CORONARY ANGIOGRAPHY N/A 08/13/2020   Procedure: LEFT HEART CATH AND CORONARY ANGIOGRAPHY;  Surgeon: Nelva Bush, MD;  Location: [place] [facility];  Service: Cardiovascular;  Laterality: N/A;   LEFT HEART CATHETERIZATION WITH CORONARY ANGIOGRAM N/A 07/06/2011   Procedure: LEFT HEART CATHETERIZATION WITH CORONARY ANGIOGRAM;  Surgeon: Jettie Booze, MD;  Location: [facility] CATH LAB;  Service: Cardiovascular;  Laterality: N/A;  possible PCI   LEFT HEART CATHETERIZATION WITH CORONARY ANGIOGRAM N/A 12/19/2012   Procedure: LEFT HEART CATHETERIZATION WITH CORONARY ANGIOGRAM;  Surgeon: Jettie Booze, MD;  Location: [facility] CATH LAB;  Service: Cardiovascular;  Laterality: N/A;   MEMBRANE PEEL Left 07/16/2013   Procedure: MEMBRANE PEEL;  Surgeon: Adonis Brook, MD;  Location: [place];  Service: Ophthalmology;  Laterality: Left;   PARS PLANA VITRECTOMY Left 07/16/2013   Procedure: PARS PLANA VITRECTOMY WITH 23 GAUGE;  Surgeon: Adonis Brook, MD;  Location: [place];  Service: Ophthalmology;  Laterality: Left;   PARS PLANA VITRECTOMY Left 07/30/2013   Procedure: PARS PLANA VITRECTOMY WITH 23 GAUGE WITH ENDOLASER;  Surgeon: Adonis Brook, MD;  Location: [place];  Service: Ophthalmology;  Laterality: Left;  with endolaser   PERCUTANEOUS CORONARY STENT INTERVENTION (PCI-S) N/A 07/06/2011   Procedure: PERCUTANEOUS CORONARY STENT INTERVENTION (PCI-S);  Surgeon: Jettie Booze, MD;  Location: [facility] CATH LAB;  Service: Cardiovascular;  Laterality: N/A;   PHOTOCOAGULATION WITH LASER Left 07/16/2013   Procedure: PHOTOCOAGULATION WITH LASER;  Surgeon: Adonis Brook, MD;  Location: [place];  Service: Ophthalmology;  Laterality: Left;  ENDOLASER    Current Medications: Current Meds  Medication Sig   Accu-Chek FastClix Lancets MISC    ACCU-CHEK GUIDE test strip    acetaminophen (TYLENOL) 500 MG tablet Take 500-1,000 mg by mouth every 6 (six) hours as needed for moderate pain.    albuterol (PROVENTIL HFA;VENTOLIN HFA) 108 (90 Base) MCG/ACT inhaler Inhale 2 puffs into the lungs every 6 (six) hours as needed for wheezing or shortness of  breath.   ammonium lactate (AMLACTIN) 12 % cream Apply topically as needed for dry skin.   amoxicillin-clavulanate (AUGMENTIN) 875-125 MG tablet 1 tab by mouth twice daily for 7 days.   Blood Glucose Monitoring Suppl (ACCU-CHEK GUIDE ME) w/Device KIT    Cholecalciferol (VITAMIN D3) 10 MCG (400 UNIT) tablet 2 tabs by mouth daily. (Patient taking differently: 1 tabs by mouth daily.)   ciclopirox (PENLAC) 8 % solution Apply topically at bedtime. Apply over nail and surrounding skin. Apply daily over previous coat. After seven (7) days, may remove with alcohol and continue cycle.   clopidogrel (PLAVIX) 75 MG tablet TAKE 1 TABLET BY MOUTH EVERY DAY   Continuous Blood Gluc Receiver (DEXCOM G6 RECEIVER) DEVI AS DIRECTED 364 DAYS   Continuous Blood Gluc Sensor (FREESTYLE LIBRE 2 SENSOR) MISC Apply topically every other day.   Continuous Blood Gluc Transmit (DEXCOM G6 TRANSMITTER) MISC AS DIRECTED 90 DAYS   cyclobenzaprine (FLEXERIL) 10 MG tablet Take 1-2 tablets (10-20 mg total) by mouth at bedtime as needed for  muscle spasms. Muscle spasms   diphenhydrAMINE (BENADRYL) 25 MG tablet Take 25 mg by mouth every 6 (six) hours as needed for itching or allergies.   famotidine (PEPCID) 20 MG tablet Take 20 mg by mouth 2 (two) times daily.   folic acid (FOLVITE) 563 MCG tablet Take 1,200 mcg by mouth daily.   furosemide (LASIX) 40 MG tablet TAKE 1 TABLET BY MOUTH EVERY DAY IN THE MORNING   gabapentin (NEURONTIN) 100 MG capsule TAKE 1 CAPSULE BY MOUTH AT BEDTIME.   Incontinence Supply Disposable (POISE ULTRA THINS) PADS Use 5 pads daily as needed for urinary stress incontinence   insulin aspart (NOVOLOG) 100 UNIT/ML injection Inject 4-16 Units into the skin 3 (three) times daily with meals. Per Sliding scale CBG 100-150: 4 units, 151-200: 6 units, 201-250: 8 units, 251-300: 10 units, 301-350: 12 units, 351-400: 14 units, > 401; call doctor   itraconazole (SPORANOX) 100 MG capsule 2 caps by mouth once daily for 42 days.   LANTUS SOLOSTAR 100 UNIT/ML Solostar Pen Inject 10 Units into the skin 2 (two) times daily.    methotrexate (RHEUMATREX) 2.5 MG tablet Take 10 mg by mouth every Wednesday.   metoprolol tartrate (LOPRESSOR) 25 MG tablet TAKE 1 TABLET BY MOUTH TWICE A DAY   Nicotine (NICOTROL NS) 10 MG/ML SOLN 1-2 SPRAYS IN NOSTRIL PER HOUR TRY TO LIMIT TO 10 SPRAYS DAILY   nitroGLYCERIN (NITROSTAT) 0.4 MG SL tablet Place 1 tablet (0.4 mg total) under the tongue every 5 (five) minutes as needed for chest pain. Max 3 doses, call 911.   ondansetron (ZOFRAN) 4 MG tablet TAKE 1 TABLET BY MOUTH EVERY 6 HOURS   pantoprazole (PROTONIX) 40 MG tablet Take 40 mg by mouth daily.   prednisoLONE acetate (PRED FORTE) 1 % ophthalmic suspension Place 4-5 drops into the left eye daily.   rosuvastatin (CRESTOR) 40 MG tablet TAKE 1 TABLET BY MOUTH EVERY DAY   sertraline (ZOLOFT) 100 MG tablet 1 1/2 TABS BY MOUTH DAILY   traMADol (ULTRAM) 50 MG tablet TAKE 1 TABLET EVERY 6 HOURS AS NEEDED FOR MODERATE PAIN   traMADol (ULTRAM) 50 MG  tablet Take by mouth. Duplicate     Allergies:   Aspirin   Social History   Socioeconomic History   Marital status: Divorced    Spouse name: Not on file   Number of children: 1   Years of education: 12   Highest education level: [employer] school graduate  Occupational History   Occupation: Disabled   Tobacco Use   Smoking status: Every Day    Packs/day: 0.50    Years: 22.00    Pack years: 11.00    Types: Cigarettes   Smokeless tobacco: Never   Tobacco comments:    Has been to classes--not sure if she wants to quit.  Changing to non menthol..  Chantix, patches, gum never helped.  9/19:  lot going on.  Vaping Use   Vaping Use: Never used  Substance and Sexual Activity   Alcohol use: Yes    Alcohol/week: 0.0 standard drinks    Comment: Extremely rare--once yearly or less.   Drug use: No    Types: "Crack" cocaine, Marijuana    Comment: 12/31/2012 " clean from crack.  Does still smoke marijuana    Sexual activity: Not Currently    Partners: Male    Birth control/protection: None  Other Topics Concern   Not on file  Social History Narrative   Has lived in [place] for most of life   Disabled   Previously went to [place] and worked as Scientist, water quality.   Lives by herself near [place].   Divorced in 2015.    She is caring for a middle school aged girl, Mackey Birchwood, while her father goes through rehab.     Social Determinants of Health   Financial Resource Strain: Not on file  Food Insecurity: Not on file  Transportation Needs: Not on file  Physical Activity: Not on file  Stress: Not on file  Social Connections: Not on file     Family History:  The patient's  family history includes Breast cancer (age of onset: 16) in her mother; Cancer (age of onset: 10) in her sister; Hypertension in her father and sister; Lung cancer (age of onset: 81) in her sister; Stomach cancer (age of onset: 71) in her sister.   ROS:   Please see the history of present illness.    ROS All  other systems reviewed and are negative.   PHYSICAL EXAM:   VS:  BP 110/70 (BP Location: Left Arm, Patient Position: Sitting, Cuff Size: Normal)    Pulse 75    Ht '5\' 1"'  (1.549 m)    Wt 183 lb (83 kg)    LMP 01/12/2015    SpO2 98%    BMI 34.58 kg/m   Physical Exam  GEN: Well nourished, well developed, in no acute distress  Neck: no JVD, carotid bruits, or masses Cardiac:RRR; no murmurs, rubs, or gallops  Respiratory:  clear to auscultation bilaterally, normal work of breathing GI: soft, nontender, nondistended, + BS Ext: without cyanosis, clubbing, or edema, Good distal pulses bilaterally Neuro:  Alert and Oriented x 3 Psych: euthymic mood, full affect  Wt Readings from Last 3 Encounters:  09/14/21 183 lb (83 kg)  07/14/21 178 lb (80.7 kg)  05/12/21 177 lb (80.3 kg)      Studies/Labs Reviewed:   EKG:  EKG is  ordered today.  The ekg ordered today demonstrates NSR inf Q waves, TWI inf  Recent Labs: 03/01/2021: Hemoglobin 14.0; Platelets 263 04/19/2021: ALT 7 05/20/2021: BUN 19; Creatinine, Ser 1.13; Potassium 5.0; Sodium 140   Lipid Panel    Component Value Date/Time   CHOL 149 09/08/2019 1503   TRIG 66 09/08/2019 1503   HDL 70 09/08/2019 1503   CHOLHDL 1.9 01/30/2019 0848   CHOLHDL 2.1 06/16/2018 0642   VLDL 14 06/16/2018 0642   LDLCALC 66 09/08/2019 1503  Additional studies/ records that were reviewed today include:  Cardiac cath 07/2020 Conclusions: Multivessel coronary artery disease, including sequential 60-70% and 20-30% mid LAD stenoses (DFR significant @ 0.85), 40% mid LCx lesion, and chronic total occlusion of mid/distal RCA.  The distal RCA branches fill via faint left-to-right collaterals. Widely patent mid/distal LCx stent. Severe in-stent restenosis of RCA with 70% proximal disease and chronic total occlusion of the mid and distal segments. Normal left ventricular filling pressure. Successful DFR-guided PCI to mid LAD using Resolute Onyx 2.75 x 26 mm drug  eluting stent (postdilated to 3.1 mm) with 10% residual stenosis and TIMI-3 flow.   Recommendations: Dual antiplatelet therapy with aspirin and clopidogrel for up to 1 month (if tolerated) followed by indefinite clopidogrel 75 mg daily. Aggressive secondary prevention of coronary artery disease.   Nelva Bush, MD [facility]     Risk Assessment/Calculations:         ASSESSMENT:    1. Preoperative clearance   2. Coronary artery disease involving native coronary artery of native heart without angina pectoris   3. Essential hypertension   4. Hyperlipidemia, unspecified hyperlipidemia type   5. Tobacco abuse   6. Type 2 diabetes mellitus with other circulatory complication, unspecified whether long term insulin use (HCC)      PLAN:  In order of problems listed above:  Preop clearance for Facet injections by Dr. Hulen Luster  and colonoscopy/endoscopy by [facility].   Patient with a lot of indigestion, food getting stuck, 20 lb weight gain. No angina. 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 METS over 5. Can proceed with above procedures.  Dr. Irish Lack confirmed that patient can hold Plavix for 5 days since she completed 1 year since her last stent.  According to the Revised Cardiac Risk Index (RCRI), her Perioperative Risk of Major Cardiac Event is (%): 6.6  Her Functional Capacity in METs is: 5.07 according to the [facility] Activity Status Index (DASI).           CAD Cath 07/2020 status post DES to the mid LAD, patent stent to the circumflex, severe in-stent restenosis of the RCA with 70% proximal disease and total occlusion of the mid and distal segment.  Plan was for aspirin and Plavix for 1 month followed by indefinite clopidogrel 75 mg daily.  HTN BP controlled  HLD on crestor-checking labs today.  Tobacco abuse-discussed importance of smoking cessation  DM on insulin  Shared Decision Making/Informed Consent        Medication Adjustments/Labs and Tests  Ordered: Current medicines are reviewed at length with the patient today.  Concerns regarding medicines are outlined above.  Medication changes, Labs and Tests ordered today are listed in the Patient Instructions below. Patient Instructions  Medication Instructions:  Your physician recommends that you continue on your current medications as directed. Please refer to the Current Medication list given to you today.  *If you need a refill on your cardiac medications before your next appointment, please call your pharmacy*   Lab Work: TODAY:  CMET, CBC, TSH, LIPID, & LIPOa  If you have labs (blood work) drawn today and your tests are completely normal, you will receive your results only by: [place] (if you have MyChart) OR A paper copy in the mail If you have any lab test that is abnormal or we need to change your treatment, we will call you to review the results.   Testing/Procedures: None ordered   Follow-Up: At [facility], you and your health needs  are our priority.  As part of our continuing mission to provide you with exceptional heart care, we have created designated Provider Care Teams.  These Care Teams include your primary Cardiologist (physician) and Advanced Practice Providers (APPs -  Physician Assistants and Nurse Practitioners) who all work together to provide you with the care you need, when you need it.  We recommend signing up for the patient portal called "MyChart".  Sign up information is provided on this After Visit Summary.  MyChart is used to connect with patients for Virtual Visits (Telemedicine).  Patients are able to view lab/test results, encounter notes, upcoming appointments, etc.  Non-urgent messages can be sent to your provider as well.   To learn more about what you can do with MyChart, go to [email or web address].    Your next appointment:   12 month(s)  The format for your next appointment:   In Person  Provider:   Larae Grooms, MD      Other Instructions    Signed, Ermalinda Barrios, PA-C  09/14/2021 3:05 PM    [facility] [address], [place], [place]  [address] Phone: [phone]; Fax: [phone]

## 2021-09-12 ENCOUNTER — Other Ambulatory Visit: Payer: Self-pay | Admitting: Internal Medicine

## 2021-09-12 DIAGNOSIS — B351 Tinea unguium: Secondary | ICD-10-CM

## 2021-09-12 NOTE — Progress Notes (Signed)
Medicaid has not been accepted at two [facility] clinics here in [place].  Will try [facility].

## 2021-09-14 ENCOUNTER — Ambulatory Visit (INDEPENDENT_AMBULATORY_CARE_PROVIDER_SITE_OTHER): Payer: Medicaid Other | Admitting: Physician Assistant

## 2021-09-14 ENCOUNTER — Other Ambulatory Visit: Payer: Self-pay

## 2021-09-14 ENCOUNTER — Encounter: Payer: Self-pay | Admitting: Physician Assistant

## 2021-09-14 VITALS — BP 110/70 | HR 75 | Ht 61.0 in | Wt 183.0 lb

## 2021-09-14 DIAGNOSIS — I2510 Atherosclerotic heart disease of native coronary artery without angina pectoris: Secondary | ICD-10-CM

## 2021-09-14 DIAGNOSIS — I10 Essential (primary) hypertension: Secondary | ICD-10-CM | POA: Diagnosis not present

## 2021-09-14 DIAGNOSIS — Z01818 Encounter for other preprocedural examination: Secondary | ICD-10-CM

## 2021-09-14 DIAGNOSIS — Z720 Tobacco use: Secondary | ICD-10-CM

## 2021-09-14 DIAGNOSIS — E785 Hyperlipidemia, unspecified: Secondary | ICD-10-CM | POA: Diagnosis not present

## 2021-09-14 DIAGNOSIS — E1159 Type 2 diabetes mellitus with other circulatory complications: Secondary | ICD-10-CM

## 2021-09-14 NOTE — Patient Instructions (Addendum)
Medication Instructions:  Your physician recommends that you continue on your current medications as directed. Please refer to the Current Medication list given to you today.  *If you need a refill on your cardiac medications before your next appointment, please call your pharmacy*   Lab Work: TODAY:  CMET, CBC, TSH, LIPID, & LIPOa  If you have labs (blood work) drawn today and your tests are completely normal, you will receive your results only by: MyChart Message (if you have MyChart) OR A paper copy in the mail If you have any lab test that is abnormal or we need to change your treatment, we will call you to review the results.   Testing/Procedures: None ordered   Follow-Up: At [facility], you and your health needs are our priority.  As part of our continuing mission to provide you with exceptional heart care, we have created designated Provider Care Teams.  These Care Teams include your primary Cardiologist (physician) and Advanced Practice Providers (APPs -  Physician Assistants and Nurse Practitioners) who all work together to provide you with the care you need, when you need it.  We recommend signing up for the patient portal called "MyChart".  Sign up information is provided on this After Visit Summary.  MyChart is used to connect with patients for Virtual Visits (Telemedicine).  Patients are able to view lab/test results, encounter notes, upcoming appointments, etc.  Non-urgent messages can be sent to your provider as well.   To learn more about what you can do with MyChart, go to [email or web address].    Your next appointment:   12 month(s)  The format for your next appointment:   In Person  Provider:   Lance Muss, MD     Other Instructions

## 2021-09-15 LAB — CBC
Hematocrit: 41.3 % (ref 34.0–46.6)
Hemoglobin: 13.7 g/dL (ref 11.1–15.9)
MCH: 29 pg (ref 26.6–33.0)
MCHC: 33.2 g/dL (ref 31.5–35.7)
MCV: 87 fL (ref 79–97)
Platelets: 335 10*3/uL (ref 150–450)
RBC: 4.73 x10E6/uL (ref 3.77–5.28)
RDW: 15.6 % — ABNORMAL HIGH (ref 11.7–15.4)
WBC: 7.4 10*3/uL (ref 3.4–10.8)

## 2021-09-15 LAB — LIPID PANEL
Chol/HDL Ratio: 2.6 ratio (ref 0.0–4.4)
Cholesterol, Total: 181 mg/dL (ref 100–199)
HDL: 70 mg/dL (ref >39)
LDL Chol Calc (NIH): 99 mg/dL (ref 0–99)
Triglycerides: 62 mg/dL (ref 0–149)
VLDL Cholesterol Cal: 12 mg/dL (ref 5–40)

## 2021-09-15 LAB — COMPREHENSIVE METABOLIC PANEL
ALT: 5 IU/L (ref 0–32)
AST: 21 IU/L (ref 0–40)
Albumin/Globulin Ratio: 1.2 (ref 1.2–2.2)
Albumin: 4.3 g/dL (ref 3.8–4.9)
Alkaline Phosphatase: 148 IU/L — ABNORMAL HIGH (ref 44–121)
BUN/Creatinine Ratio: 18 (ref 9–23)
BUN: 20 mg/dL (ref 6–24)
Bilirubin Total: 0.5 mg/dL (ref 0.0–1.2)
CO2: 18 mmol/L — ABNORMAL LOW (ref 20–29)
Calcium: 9.7 mg/dL (ref 8.7–10.2)
Chloride: 104 mmol/L (ref 96–106)
Creatinine, Ser: 1.12 mg/dL — ABNORMAL HIGH (ref 0.57–1.00)
Globulin, Total: 3.7 g/dL (ref 1.5–4.5)
Glucose: 113 mg/dL — ABNORMAL HIGH (ref 70–99)
Potassium: 4.4 mmol/L (ref 3.5–5.2)
Sodium: 141 mmol/L (ref 134–144)
Total Protein: 8 g/dL (ref 6.0–8.5)
eGFR: 58 mL/min/{1.73_m2} — ABNORMAL LOW (ref >59)

## 2021-09-15 LAB — TSH: TSH: 1.33 u[IU]/mL (ref 0.450–4.500)

## 2021-09-15 LAB — LIPOPROTEIN A (LPA): Lipoprotein (a): 277.2 nmol/L — ABNORMAL HIGH (ref <75.0)

## 2021-09-15 NOTE — Progress Notes (Signed)
I will contact her. Looks good for [employer]

## 2021-09-16 NOTE — Telephone Encounter (Signed)
[name] [name] [address] [place] [phone]   [facility]

## 2021-09-21 ENCOUNTER — Other Ambulatory Visit: Payer: Self-pay | Admitting: Internal Medicine

## 2021-09-21 DIAGNOSIS — B351 Tinea unguium: Secondary | ICD-10-CM

## 2021-09-27 ENCOUNTER — Other Ambulatory Visit: Payer: Self-pay | Admitting: Internal Medicine

## 2021-09-27 ENCOUNTER — Other Ambulatory Visit: Payer: Self-pay

## 2021-09-27 ENCOUNTER — Other Ambulatory Visit: Payer: Self-pay | Admitting: Interventional Cardiology

## 2021-09-27 VITALS — BP 108/53 | HR 79 | Ht 60.0 in | Wt 184.2 lb

## 2021-09-27 DIAGNOSIS — Z006 Encounter for examination for normal comparison and control in clinical research program: Secondary | ICD-10-CM

## 2021-09-27 NOTE — Research (Signed)
[name] Informed Consent   Subject Name: [name] [name]  Subject met inclusion and exclusion criteria.  The informed consent form, study requirements and expectations were reviewed with the subject and questions and concerns were addressed prior to the signing of the consent form.  The subject verbalized understanding of the trial requirements.  The subject agreed to participate in the [facility] trial and signed the informed consent at [place] on 09/27/2021.  The informed consent was obtained prior to performance of any protocol-specific procedures for the subject.  A copy of the signed informed consent was given to the subject and a copy was placed in the subject's medical record.   [place]  [facility] Consent Version 2 Protocol Version 1 amendment 1

## 2021-09-27 NOTE — Research (Signed)
[facility] Screening Visit [id number] SUBJECT KW:[id number]    DATE:09/27/2021                               The following was completed during visit: [x] CONSENT SIGNED [x] INCLUSION/EXCLUSION MET [x] MEASUREMENTS TAKEN HEIGHT: "5'0                  WEIGHT: 184.2 lbs WAIST CIRMCUMFERENCE:   114 cm    HIP CIRCUMFERENCE: 119cm B/P:    108/53                        HR: 79 [] SERUM PREGNANCY TEST/FSH-NA [x] FASTING GLUCOSE/HbA1c [x] FASTING LIPID PANELY [x] LPa [x] CHEMISTRY/HEMATOLOGY [x] MEDICATIONS REVIEWED [x] Laren Boom [id number]      SITE [id number] SUBJECT ID: [id number]       DATE:    09/27/2021      [] FEMALE                            [x] FEMALE AGE: 56 ETHINICITY:   [] HISPANIC/LATINO      [x] NON- HISPANIC/LATINO RACE:           []  WHITE             [x] BLACK/AFRICAN AMERICAN                         []  ASIAN              [] AMERICAN INDIAN/[place] NATIVE                         []  NATIVE [place]/OTHER PACIFIC ISLANDER                         []  OTHER  FUTURE RESEARCH [x] USE OF SAMPLES FOR FUTURE RESEARCH [x] PHARMACOGENTIC (GENETIC) RESEARCH    Current Outpatient Medications:    Accu-Chek FastClix Lancets MISC, , Disp: , Rfl:    ACCU-CHEK GUIDE test strip, , Disp: , Rfl:    acetaminophen (TYLENOL) 500 MG tablet, Take 500-1,000 mg by mouth every 6 (six) hours as needed for moderate pain. , Disp: , Rfl:    Blood Glucose Monitoring Suppl (ACCU-CHEK GUIDE ME) w/Device KIT, , Disp: , Rfl:    Cholecalciferol (VITAMIN D3) 10 MCG (400 UNIT) tablet, 2 tabs by mouth daily. (Patient taking differently: 1 tabs by mouth daily.), Disp: 180 tablet, Rfl: 3   ciclopirox (PENLAC) 8 % solution, Apply topically at bedtime. Apply over nail and surrounding skin. Apply daily over previous coat. After seven (7) days, may remove with alcohol and continue cycle., Disp: 6.6 mL, Rfl: 2   clopidogrel (PLAVIX) 75 MG tablet, TAKE 1 TABLET BY MOUTH EVERY DAY, Disp: 90  tablet, Rfl: 0   Continuous Blood Gluc Receiver (DEXCOM G6 RECEIVER) DEVI, AS DIRECTED 364 DAYS, Disp: , Rfl:    Continuous Blood Gluc Sensor (FREESTYLE LIBRE 2 SENSOR) MISC, Apply topically every other day., Disp: , Rfl:    Continuous Blood Gluc Transmit (DEXCOM G6 TRANSMITTER) MISC, AS DIRECTED 90 DAYS, Disp: , Rfl:    cyclobenzaprine (FLEXERIL) 10 MG tablet, Take 1-2 tablets (10-20 mg total) by mouth at bedtime as needed for muscle spasms. Muscle spasms, Disp: 30 tablet, Rfl:  0 °  folic acid (FOLVITE) 400 MCG tablet, Take 1,200 mcg by mouth daily., Disp: , Rfl:  °  furosemide (LASIX) 40 MG tablet, TAKE 1 TABLET BY MOUTH EVERY DAY IN THE MORNING, Disp: 90 tablet, Rfl: 3 °  insulin aspart (NOVOLOG) 100 UNIT/ML injection, Inject 4-16 Units into the skin 3 (three) times daily with meals. Per Sliding scale CBG 100-150: 4 units, 151-200: 6 units, 201-250: 8 units, 251-300: 10 units, 301-350: 12 units, 351-400: 14 units, > 401; call doctor, Disp: , Rfl:  °  LANTUS SOLOSTAR 100 UNIT/ML Solostar Pen, Inject 10 Units into the skin 2 (two) times daily. , Disp: , Rfl: 3 °  methotrexate (RHEUMATREX) 2.5 MG tablet, Take 10 mg by mouth every Wednesday., Disp: , Rfl:  °  metoprolol tartrate (LOPRESSOR) 25 MG tablet, TAKE 1 TABLET BY MOUTH TWICE A DAY, Disp: 180 tablet, Rfl: 3 °  nitroGLYCERIN (NITROSTAT) 0.4 MG SL tablet, Place 1 tablet (0.4 mg total) under the tongue every 5 (five) minutes as needed for chest pain. Max 3 doses, call 911., Disp: 25 tablet, Rfl: 7 °  ondansetron (ZOFRAN) 4 MG tablet, TAKE 1 TABLET BY MOUTH EVERY 6 HOURS, Disp: 12 tablet, Rfl: 0 °  pantoprazole (PROTONIX) 40 MG tablet, Take 40 mg by mouth daily., Disp: , Rfl:  °  prednisoLONE acetate (PRED FORTE) 1 % ophthalmic suspension, Place 4-5 drops into the left eye daily., Disp: , Rfl: 6 °  rosuvastatin (CRESTOR) 40 MG tablet, TAKE 1 TABLET BY MOUTH EVERY DAY, Disp: 90 tablet, Rfl: 1 °  sertraline (ZOLOFT) 100 MG tablet, 1 1/2 TABS BY MOUTH DAILY,  Disp: 135 tablet, Rfl: 3 °  traMADol (ULTRAM) 50 MG tablet, TAKE 1 TABLET EVERY 6 HOURS AS NEEDED FOR MODERATE PAIN, Disp: 90 tablet, Rfl: 2 °  albuterol (PROVENTIL HFA;VENTOLIN HFA) 108 (90 Base) MCG/ACT inhaler, Inhale 2 puffs into the lungs every 6 (six) hours as needed for wheezing or shortness of breath. (Patient not taking: Reported on 09/27/2021), Disp: 1 Inhaler, Rfl: 0 °  ammonium lactate (AMLACTIN) 12 % cream, Apply topically as needed for dry skin. (Patient not taking: Reported on 09/27/2021), Disp: 385 g, Rfl: 0 °  amoxicillin-clavulanate (AUGMENTIN) 875-125 MG tablet, 1 tab by mouth twice daily for 7 days. (Patient not taking: Reported on 09/27/2021), Disp: 14 tablet, Rfl: 0 °  diphenhydrAMINE (BENADRYL) 25 MG tablet, Take 25 mg by mouth every 6 (six) hours as needed for itching or allergies. (Patient not taking: Reported on 09/27/2021), Disp: , Rfl:  °  famotidine (PEPCID) 20 MG tablet, Take 20 mg by mouth 2 (two) times daily. (Patient not taking: Reported on 09/27/2021), Disp: , Rfl:  °  gabapentin (NEURONTIN) 100 MG capsule, TAKE 1 CAPSULE BY MOUTH AT BEDTIME. (Patient not taking: Reported on 09/27/2021), Disp: 30 capsule, Rfl: 0 °  Incontinence Supply Disposable (POISE ULTRA THINS) PADS, Use 5 pads daily as needed for urinary stress incontinence, Disp: 150 each, Rfl: 11 °  itraconazole (SPORANOX) 100 MG capsule, 2 caps by mouth once daily for 42 days. (Patient not taking: Reported on 09/27/2021), Disp: 84 capsule, Rfl: 0 °  Nicotine (NICOTROL NS) 10 MG/ML SOLN, 1-2 SPRAYS IN NOSTRIL PER HOUR TRY TO LIMIT TO 10 SPRAYS DAILY (Patient not taking: Reported on 09/27/2021), Disp: 10 mL, Rfl: 4 °  traMADol (ULTRAM) 50 MG tablet, Take by mouth. Duplicate, Disp: , Rfl:   °

## 2021-09-29 ENCOUNTER — Ambulatory Visit: Payer: Medicaid Other | Admitting: Interventional Cardiology

## 2021-09-30 NOTE — Research (Addendum)
[name](a) Screening Labs       ABNORMAL LABS: TEST RESULT  Glucose 50  Hgb A1c 6.6  Alk Phos 163  RDW 16.1   Are these clinically significant? _0  YES              _1  NO   Pixie Casino, MD, FACC, [place] Director of the [facility]  [facility] Diplomate of the American Board of Clinical Lipidology Attending Cardiologist  Direct Dial: [phone]   Fax: [phone]  Website:  [email or web address]

## 2021-10-03 NOTE — Research (Signed)
° °  SCREENING VISIT

## 2021-10-07 NOTE — Research (Signed)
°  [name](A) SCREENING VISIT

## 2021-10-11 ENCOUNTER — Other Ambulatory Visit: Payer: Self-pay

## 2021-10-11 ENCOUNTER — Ambulatory Visit (INDEPENDENT_AMBULATORY_CARE_PROVIDER_SITE_OTHER): Payer: Medicaid Other | Admitting: Podiatry

## 2021-10-11 DIAGNOSIS — E1149 Type 2 diabetes mellitus with other diabetic neurological complication: Secondary | ICD-10-CM

## 2021-10-11 DIAGNOSIS — M79674 Pain in right toe(s): Secondary | ICD-10-CM

## 2021-10-11 DIAGNOSIS — M79675 Pain in left toe(s): Secondary | ICD-10-CM

## 2021-10-11 DIAGNOSIS — B351 Tinea unguium: Secondary | ICD-10-CM | POA: Diagnosis not present

## 2021-10-14 NOTE — Progress Notes (Signed)
Subjective: ?56 y.o. returns the office today for painful, elongated, thickened toenails which she cannot trim herself.  She has a callus at the lower left foot that needs to be trimmed.  No open lesions. Denies any fevers or chills.  No other concerns.  ? ?PCP: Mack Hook, MD ?Endocrinologist: Dr. Delrae Rend, MD-last seen October 06, 2021 ?A1c: 6.7 on 08/12/2020 ? ?Objective: ?AAO ?3, NAD ?DP/PT pulses palpable, CRT less than 3 seconds ?Sensation decreased with Semmes Weinstein monofilament. ?Nails hypertrophic, dystrophic, elongated, brittle, discolored ?10. There is tenderness overlying the nails 1-5 bilaterally. There is no surrounding erythema or drainage along the nail sites. ?Hyperkeratotic lesion submetatarsal left foot without any underlying ulceration drainage or signs of infection. ?No open lesions or ulcerations ?Hammertoes present ?No pain with calf compression, swelling, warmth, erythema. ? ?Assessment: ?Patient presents with symptomatic onychomycosis, hyperkeratotic lesion. ? ?Plan: ?-Treatment options including alternatives, risks, complications were discussed ?-Nails sharply debrided ?10 without complication/bleeding. ?-Sharply debrided hyperkeratotic lesion x1 without any complications or bleeding. ?-Discussed daily foot inspection. If there are any changes, to call the office immediately.  ?-Follow-up in 3 months or sooner if any problems are to arise. In the meantime, encouraged to call the office with any questions, concerns, changes symptoms. ? ?Celesta Gentile, DPM ? ?

## 2021-10-17 DIAGNOSIS — Z639 Problem related to primary support group, unspecified: Secondary | ICD-10-CM | POA: Insufficient documentation

## 2021-10-17 DIAGNOSIS — Z5986 Financial insecurity: Secondary | ICD-10-CM | POA: Insufficient documentation

## 2021-11-01 ENCOUNTER — Ambulatory Visit (INDEPENDENT_AMBULATORY_CARE_PROVIDER_SITE_OTHER): Payer: Medicaid Other | Admitting: Internal Medicine

## 2021-11-01 ENCOUNTER — Encounter: Payer: Self-pay | Admitting: Internal Medicine

## 2021-11-01 VITALS — BP 140/76 | HR 76 | Resp 16 | Ht 61.0 in | Wt 184.0 lb

## 2021-11-01 DIAGNOSIS — B351 Tinea unguium: Secondary | ICD-10-CM

## 2021-11-01 DIAGNOSIS — E108 Type 1 diabetes mellitus with unspecified complications: Secondary | ICD-10-CM

## 2021-11-01 DIAGNOSIS — Z720 Tobacco use: Secondary | ICD-10-CM

## 2021-11-01 DIAGNOSIS — Z716 Tobacco abuse counseling: Secondary | ICD-10-CM

## 2021-11-01 DIAGNOSIS — E785 Hyperlipidemia, unspecified: Secondary | ICD-10-CM | POA: Diagnosis not present

## 2021-11-01 NOTE — Progress Notes (Signed)
Subjective:    Patient ID: [name] [name], female   DOB: [birth year], 56 y.o.   MRN: [id number]   HPI   Left ring fingernail onychomycosis:  her gynecologist encouraged her to use Monistat cream on her nail and finger, which she has been doing once daily since beginning of March.  We have not been able to get her into Derm until October.  Her fingernail appears to be clearing at base finally with the Monistat and her nailbed is back to normal.    2.  GI:  Had colonoscopy with Dr. Ines Bloomer and states does not need follow up for 5 years.    3.  DM:  States last A1C was 6.2% in March as well.  Has eye exam on April 11th with [facility].    4.  Hyperlipidemia:  she is getting into a cardiac trial for her cholesterol.  LDL still [place] at 99 even with 40 mg Rosuvastatin.  Dr. Irish Lack, Cardiology.  5.  Vaccines:  does not want her bivalent covid vaccine nor her second shingrix today--wants to schedule for a later date.    6.  Tobacco abuse:  Stopped nicotine nasal spray--made her dream about using cocaine again, so stopped.  Current Meds  Medication Sig   Accu-Chek FastClix Lancets MISC    ACCU-CHEK GUIDE test strip    acetaminophen (TYLENOL) 500 MG tablet Take 500-1,000 mg by mouth every 6 (six) hours as needed for moderate pain.    albuterol (PROVENTIL HFA;VENTOLIN HFA) 108 (90 Base) MCG/ACT inhaler Inhale 2 puffs into the lungs every 6 (six) hours as needed for wheezing or shortness of breath.   ammonium lactate (AMLACTIN) 12 % cream Apply topically as needed for dry skin.   Blood Glucose Monitoring Suppl (ACCU-CHEK GUIDE ME) w/Device KIT    clopidogrel (PLAVIX) 75 MG tablet TAKE 1 TABLET BY MOUTH EVERY DAY   Continuous Blood Gluc Receiver (DEXCOM G6 RECEIVER) DEVI AS DIRECTED 364 DAYS   Continuous Blood Gluc Sensor (FREESTYLE LIBRE 2 SENSOR) MISC Apply topically every other day.   Continuous Blood Gluc Transmit (DEXCOM G6 TRANSMITTER) MISC AS DIRECTED 90 DAYS   cyclobenzaprine (FLEXERIL) 10 MG  tablet Take 1-2 tablets (10-20 mg total) by mouth at bedtime as needed for muscle spasms. Muscle spasms   diphenhydrAMINE (BENADRYL) 25 MG tablet Take 25 mg by mouth every 6 (six) hours as needed for itching or allergies.   famotidine (PEPCID) 20 MG tablet Take 20 mg by mouth 2 (two) times daily.   folic acid (FOLVITE) 867 MCG tablet Take 1,200 mcg by mouth daily.   furosemide (LASIX) 40 MG tablet TAKE 1 TABLET BY MOUTH EVERY DAY IN THE MORNING   gabapentin (NEURONTIN) 100 MG capsule TAKE 1 CAPSULE BY MOUTH AT BEDTIME. (Patient taking differently: as needed.)   Incontinence Supply Disposable (POISE ULTRA THINS) PADS Use 5 pads daily as needed for urinary stress incontinence   insulin aspart (NOVOLOG) 100 UNIT/ML injection Inject 4-16 Units into the skin 3 (three) times daily with meals. Per Sliding scale CBG 100-150: 4 units, 151-200: 6 units, 201-250: 8 units, 251-300: 10 units, 301-350: 12 units, 351-400: 14 units, > 401; call doctor   LANTUS SOLOSTAR 100 UNIT/ML Solostar Pen Inject 10 Units into the skin 2 (two) times daily.    methotrexate (RHEUMATREX) 2.5 MG tablet Take 10 mg by mouth every Wednesday.   metoprolol tartrate (LOPRESSOR) 25 MG tablet TAKE 1 TABLET BY MOUTH TWICE A DAY   nitroGLYCERIN (NITROSTAT) 0.4 MG  SL tablet Place 1 tablet (0.4 mg total) under the tongue every 5 (five) minutes as needed for chest pain. Max 3 doses, call 911.   ondansetron (ZOFRAN) 4 MG tablet TAKE 1 TABLET BY MOUTH EVERY 6 HOURS (Patient taking differently: Needs refill)   pantoprazole (PROTONIX) 40 MG tablet Take 40 mg by mouth daily.   prednisoLONE acetate (PRED FORTE) 1 % ophthalmic suspension Place 4-5 drops into the left eye daily.   rosuvastatin (CRESTOR) 40 MG tablet TAKE 1 TABLET BY MOUTH EVERY DAY   sertraline (ZOLOFT) 100 MG tablet 1 1/2 TABS BY MOUTH DAILY   traMADol (ULTRAM) 50 MG tablet TAKE 1 TABLET EVERY 6 HOURS AS NEEDED FOR MODERATE PAIN   Allergies  Allergen Reactions   Aspirin Other (See  Comments)    Stomach bleeds      Review of Systems    Objective:   BP 140/76 (BP Location: Left Arm, Patient Position: Sitting, Cuff Size: Normal)   Pulse 76   Resp 16   Ht '5\' 1"'  (1.549 m)   Wt 184 lb (83.5 kg)   LMP 01/12/2015   BMI 34.77 kg/m   Physical Exam NAD HEENT:  Right pupil RRL, EOMI,  Neck:  Supple, No adenopathy Chest:  CTA CV:  RRR with normal S1 and S2, No S3, S4 or murmur.  No carotid bruits.  Carotid, radial and DP pulses normal and equal Abd:  S, NT, No HSM or mass.  + BS Left ring fingernail with clearing at base and no inflammation around nail bed.   Assessment & Plan    Left ring finger onychomycosis:  surprisingly responding to topical treatment.  Continue treatment until abnormal nail grows out.  2.  HM:  No colonoscopy for 5 years.  Holding on vaccines recommended for today.  3.  DM: controlled.  Upcoming eye exam.    4.  Hyperlipidemia;:  Reportedly in a study to possibly receive additional treatment besides Rosuvastatin.  Encouraged healthier diet and recommendations she learned with [facility] program.  5.  Tobacco abuse:  Currently without obvious alternative for treatment after trying nicotine nasal spray.

## 2021-11-01 NOTE — Patient Instructions (Signed)
Drink a glass of water before every meal ?Drink 6-8 glasses of water daily ?Eat three meals daily ?Eat a protein and healthy fat with every meal (eggs,fish, chicken, [place] and limit red meats) ?Eat 5 servings of vegetables daily, mix the colors ?Eat 2 servings of fruit daily with skin, if skin is edible ?Use smaller plates ?Put food/utensils down as you chew and swallow each bite ?Eat at a table with friends/family at least once daily, no TV ?Do not eat in front of the TV ? ?Recent studies show that people who consume all of their calories in a 12 hour period lose weight more efficiently.  For example, if you eat your first meal at 7:00 a.m., your last meal of the day should be completed by 7:00 p.m.  ? ?Get back to [facility] recommendations for how you eat.   ?

## 2021-11-13 ENCOUNTER — Other Ambulatory Visit: Payer: Self-pay | Admitting: Internal Medicine

## 2021-11-14 VITALS — BP 124/47 | HR 72

## 2021-11-14 DIAGNOSIS — Z006 Encounter for examination for normal comparison and control in clinical research program: Secondary | ICD-10-CM

## 2021-11-14 MED ORDER — STUDY - OCEAN(A) - OLPASIRAN (AMG 890) 142 MG/ML OR PLACEBO SQ INJECTION (PI-HILTY)
142.0000 mg | PREFILLED_SYRINGE | Freq: Once | SUBCUTANEOUS | Status: AC
  Administered 2021-11-14: 142 mg via SUBCUTANEOUS
  Filled 2021-11-14: qty 1

## 2021-11-14 NOTE — Research (Addendum)
AMGEN [id number] SITE #: [id number] SUBJECT ID#: ?[id number]  ?                  ELIGIBLITY CRITERIA WORKSHEET ?                            INCLUSION CRITERIA ?PROVIDED INFORMED CONSENT _0   ?AGE 55 TO ? 25 _1   ?Lp(a) ? 200 nmol/L during screening by central lab _2   ?MI (presumed type 1 event due to plaque rupture       AND/OR _3   ?PCI stenting AND one of the following: _4   ?AGE ? 65 _5   ?Residual coronary artery stenosis > 50% in any major vessel _6   ?Multivessel PCI _7   ?Symptomatic peripheral arterial disease with either (a) intermittent claudication with ankle-brachial index (ABI) < 0.85, or (b) peripheral arterial revascularization or amputation due to atherosclerotic disease _8   ?Ischemic stroke (presumed atherosclerotic in origin) _9   ?DM    HbA1c:  _________6.6________ _10   ?                     EXCLUSION CRITERIA           N/A                          _11  ?Severe renal function eGFR < 19m/min/1.73 m? _12   ?Active liver disease, known hepatitis, or any hepatic dysfunction, defined as AST or ALT > 3 x upper limit of normal, or total bilirubin > 2 x ULN during screening _13   ?History of hemorrhagic stroke _14   ?History of major bleeding disorder _15   ?Major cardiovascular event within 4 weeks prior to Lp(a) screening or during screening _16   ?Planned cardiac or arterial revascularization _17   ?Malignancy within the last 5 years prior to Study day 1 (except non-melanoma skin cancers, cervical and breast ductal carcinoma in situ, or stage 1 prostate) _18   ?Severe Heart Failure Class IV, and/or left EF < 30% _19   ?Uncontrolled or recurrent Ventricular tachycardia in the last 3 months _20   ?A fib/flutter not on therapeutic anticoagulation or having placement of left atrial appendage closure device _21   ?Uncontrolled HTN at screening, systolic > 1867YPPJor diastolic > [id number]<<[id number]>_9<\/JQBHALPFXTKWIOXB>_35  ?Fasting triglycerides > 4066mdL during screening _23   ?HbA1c > 10% _24   ?Know major active infection _25   ?Current or planned lipoprotein apheresis  or < 3 months since last apheresis _26   ?Previously received RNA therapy specifically targeting Lp(a) _27   ?  ?[name](a) IP ADMINISTRATION ?Subject ID# [id number] Box #: [id number] lot#: [id number] Given: 1005 ?Administration site: RLQ ?Patient was seen in the research clinic today for randomization of [name](a) trial. All inclusion/exclusions were reviewed with the patient and Dr StLia Foyeriscussed the study with the patient. She wishes to proceed with randomization. ECK and lab work completed per protocol. PG sample was also drawn as patient did consent to this. All concomitant medications reviewed and updated as necessary. New consent version 3 signed. Patient to come back for PK lab to be drawn later today. ? ? ?Current Outpatient Medications:  ?  acetaminophen (TYLENOL) 500 MG tablet, Take 500-1,000 mg by mouth every 6 (six) hours as needed for moderate pain. , Disp: , Rfl:  ?  albuterol (PROVENTIL HFA;VENTOLIN HFA) 108 (90 Base) MCG/ACT inhaler, Inhale 2 puffs into the lungs every 6 (six) hours as needed for wheezing or shortness of breath., Disp: 1 Inhaler,  Rfl: 0 ?  clopidogrel (PLAVIX) 75 MG tablet, TAKE 1 TABLET BY MOUTH EVERY DAY, Disp: 90 tablet, Rfl: 0 ?  cyclobenzaprine (FLEXERIL) 10 MG tablet, Take 1-2 tablets (10-20 mg total) by mouth at bedtime as needed for muscle spasms. Muscle spasms, Disp: 30 tablet, Rfl: 0 ?  folic acid (FOLVITE) 655 MCG tablet, Take 1,200 mcg by mouth daily., Disp: , Rfl:  ?  furosemide (LASIX) 40 MG tablet, TAKE 1 TABLET BY MOUTH EVERY DAY IN THE MORNING, Disp: 90 tablet, Rfl: 3 ?  insulin aspart (NOVOLOG) 100 UNIT/ML injection, Inject 4-16 Units into the skin 3 (three) times daily with meals. Per Sliding scale CBG 100-150: 4 units, 151-200: 6 units, 201-250: 8 units, 251-300: 10 units, 301-350: 12 units, 351-400: 14 units, > 401; call doctor, Disp: , Rfl:  ?  LANTUS SOLOSTAR 100 UNIT/ML Solostar Pen, Inject 10 Units into the skin 2 (two) times daily. , Disp: , Rfl: 3 ?   methotrexate (RHEUMATREX) 2.5 MG tablet, Take 10 mg by mouth every Wednesday., Disp: , Rfl:  ?  metoprolol tartrate (LOPRESSOR) 25 MG tablet, TAKE 1 TABLET BY MOUTH TWICE A DAY, Disp: 180 tablet, Rfl: 3 ?  nitroGLYCERIN (NITROSTAT) 0.4 MG SL tablet, Place 1 tablet (0.4 mg total) under the tongue every 5 (five) minutes as needed for chest pain. Max 3 doses, call 911., Disp: 25 tablet, Rfl: 7 ?  ondansetron (ZOFRAN) 4 MG tablet, 1 tab by mouth every 6 hours as needed for nausea and vomiting., Disp: 12 tablet, Rfl: 0 ?  pantoprazole (PROTONIX) 40 MG tablet, Take 40 mg by mouth daily., Disp: , Rfl:  ?  prednisoLONE acetate (PRED FORTE) 1 % ophthalmic suspension, Place 4-5 drops into the left eye daily., Disp: , Rfl: 6 ?  rosuvastatin (CRESTOR) 40 MG tablet, TAKE 1 TABLET BY MOUTH EVERY DAY, Disp: 90 tablet, Rfl: 1 ?  sertraline (ZOLOFT) 100 MG tablet, 1 1/2 TABS BY MOUTH DAILY, Disp: 135 tablet, Rfl: 3 ?  Study - [name](A) - olpasiran (AMG 890) 142 mg/mL or placebo SQ injection (PI-Hilty), Inject 142 mg into the skin once. For Investigational Use Only. Inject 1 mL (1 prefilled syringe) subcutaneously into appropriate injection site per protocol. (Approved injection site(s): upper arm, upper thigh & abdomen). Please contact [facility] with any questions or concerns regarding this medication., Disp: , Rfl:  ?  traMADol (ULTRAM) 50 MG tablet, TAKE 1 TABLET EVERY 6 HOURS AS NEEDED FOR MODERATE PAIN, Disp: 90 tablet, Rfl: 2 ?  Accu-Chek FastClix Lancets MISC, , Disp: , Rfl:  ?  ACCU-CHEK GUIDE test strip, , Disp: , Rfl:  ?  ammonium lactate (AMLACTIN) 12 % cream, Apply topically as needed for dry skin. (Patient not taking: Reported on 11/14/2021), Disp: 385 g, Rfl: 0 ?  Blood Glucose Monitoring Suppl (ACCU-CHEK GUIDE ME) w/Device KIT, , Disp: , Rfl:  ?  Cholecalciferol (VITAMIN D3) 10 MCG (400 UNIT) tablet, 2 tabs by mouth daily. (Patient not taking: Reported on 11/01/2021), Disp: 180 tablet, Rfl: 3 ?  Continuous Blood  Gluc Receiver (DEXCOM G6 RECEIVER) DEVI, AS DIRECTED 364 DAYS, Disp: , Rfl:  ?  Continuous Blood Gluc Sensor (FREESTYLE LIBRE 2 SENSOR) MISC, Apply topically every other day., Disp: , Rfl:  ?  Continuous Blood Gluc Transmit (DEXCOM G6 TRANSMITTER) MISC, AS DIRECTED 90 DAYS, Disp: , Rfl:  ?  diphenhydrAMINE (BENADRYL) 25 MG tablet, Take 25 mg by mouth every 6 (six) hours as needed for itching or allergies. (Patient not taking:  Reported on 11/14/2021), Disp: , Rfl:  ?  famotidine (PEPCID) 20 MG tablet, Take 20 mg by mouth 2 (two) times daily. (Patient not taking: Reported on 11/14/2021), Disp: , Rfl:  ?  gabapentin (NEURONTIN) 100 MG capsule, TAKE 1 CAPSULE BY MOUTH AT BEDTIME. (Patient not taking: Reported on 11/14/2021), Disp: 30 capsule, Rfl: 0 ?  Incontinence Supply Disposable (POISE ULTRA THINS) PADS, Use 5 pads daily as needed for urinary stress incontinence, Disp: 150 each, Rfl: 11 ?  Nicotine (NICOTROL NS) 10 MG/ML SOLN, 1-2 SPRAYS IN NOSTRIL PER HOUR TRY TO LIMIT TO 10 SPRAYS DAILY (Patient not taking: Reported on 09/27/2021), Disp: 10 mL, Rfl: 4 ?  traMADol (ULTRAM) 50 MG tablet, Take by mouth. Duplicate (Patient not taking: Reported on 11/01/2021), Disp: , Rfl:   ? ?Patient returned at 11:50 for PK lab draw. Lab draw completed and patient tolerated without complaints. Next appointment for 4 week visit will be May 15 @ 830. ?

## 2021-11-14 NOTE — Research (Signed)
[name] Informed Consent  ? ?Subject Name: [name] [name] ? ?Subject met inclusion and exclusion criteria.  The informed consent form, study requirements and expectations were reviewed with the subject and questions and concerns were addressed prior to the signing of the consent form.  The subject verbalized understanding of the trial requirements.  The subject agreed to participate in the [facility] trial and signed the informed consent at [place] on 11/14/2021.  The informed consent was obtained prior to performance of any protocol-specific procedures for the subject.  A copy of the signed informed consent was given to the subject and a copy was placed in the subject's medical record.  ? ?Chanda Busing ? ?[employer] Consent Version 3 ?Protocol Version 1 amendment 1  ?

## 2021-11-14 NOTE — Progress Notes (Signed)
Very pleasant female originally from [place] who has elevated LPa.  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  Currently feels well on maximum medical therapy with statins.   ? ?Alert, oriented and we had significant discussion regarding trial ?BP `124/47; P72 afebrile, normal respirations. No carotid bruits.  Lungs clear to auscultation and percussion.  Cardiac reg without murmurs.  Prior LE ulcer not visible but decrease in DP and PT pulses.  Neuro non focal.  [place] Class I.  ? ?ECG - NSR.  Old inferior MI.  Righward axis may be do to lead position (sitting with acquisition.  No acute changes. ? ?We discussed the trial in detail, including what is and is not known, existing PHass II data, the requirement for randomization, and the association of LPa with adverse outcomes.  We also discussed the clinical rational and expected drug effects.   ? ?She is aware and consents to proceed with randomization to [name].   ? ?Arturo Morton. Riley Kill, MD, [facility], FSCAI ?Medical Director, [facility] ?

## 2021-11-15 ENCOUNTER — Ambulatory Visit: Payer: Medicaid Other | Admitting: Internal Medicine

## 2021-11-16 ENCOUNTER — Telehealth: Payer: Self-pay | Admitting: *Deleted

## 2021-11-16 ENCOUNTER — Telehealth: Payer: Self-pay | Admitting: Interventional Cardiology

## 2021-11-16 NOTE — Telephone Encounter (Signed)
Will route this note to Dr Hulen Luster ?

## 2021-11-16 NOTE — Telephone Encounter (Signed)
OK to hold Plavix 5 days prior to procedure. 

## 2021-11-16 NOTE — Telephone Encounter (Signed)
Received a call from Dr. Danella Pentonruong, [facility] [phone].  ?The patient is there today for facet injection but has not held her Plavix for 5 days.  We reviewed the ov note from 09/14/21 with Leda GauzeM. Lenze, PA-C: ? ?Preop clearance for Facet injections by Dr. Danella Pentonruong  and colonoscopy/endoscopy by [facility]aha [facility]angEagle.   Patient with a lot of indigestion, food getting stuck, 20 lb weight gain. No angina. 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 METS over 5. Can proceed with above procedures.  Dr. Eldridge DaceVaranasi confirmed that patient can hold Plavix for 5 days since she completed 1 year since her last stent. ? ? ?Per Dr. Danella Pentonruong, because the clearance was for 2 procedures, the patient did not realize she was to hold Plavix for the injection and instead only held for the GI procedure.  Of note, the patient told Dr. Danella Pentonruong that she has not taken Plavix since Sunday.    ? ?Will route to Dr. Eldridge DaceVaranasi and Elon JesterMichele for review and any clarification if needed.  He requests call back at the number above if any new information is available.  Otherwise he plans to reschedule the patient.  ?

## 2021-11-16 NOTE — Telephone Encounter (Signed)
Did not meed this encounter ?

## 2021-11-17 NOTE — Research (Addendum)
[name](a) Randomization Visit lab Results ? ? ? ? ? ? ? ? ?ABNORMAL LABS: ?TEST RESULT  ?Anion Gap 19  ?RDW 16.2  ?Creatinine 1.15  ?Urea Nitr 40  ?ALK Phos             162 ?Glucose                                                                   254 ?Hgb A1c                                                                   6.7 ?Are these clinically significant? ?'[]'  YES              '[x]'  NO  ? ?Pixie Casino, MD, [facility], FACP  ?[place]  ?Medical Director of the Advanced Lipid Disorders &  ?Cardiovascular Risk Reduction Clinic ?Diplomate of the [employer] of Clinical Lipidology ?Attending Cardiologist  ?Direct Dial: [phone]  Fax: [phone]  ?Website:  [email or web address] ? ? ?

## 2021-11-21 NOTE — Research (Addendum)
[name](a) Randomization Visit Lab Results  Subject [phone] ? ? ? ? ?ABNORMAL LABS: ?TEST RESULT  ?LDL fried 79  ?cholesterol 170  ?   ?   ? ?Are these clinically significant? ?[]  YES              [x]  NO  ? ?Pixie Casino, MD, [facility], FACP  ?[place]  ?Medical Director of the Advanced Lipid Disorders &  ?Cardiovascular Risk Reduction Clinic ?Diplomate of the [employer] of Clinical Lipidology ?Attending Cardiologist  ?Direct Dial: [phone]  Fax: [phone]  ?Website:  [email or web address] ? ?

## 2021-11-22 ENCOUNTER — Ambulatory Visit: Payer: Medicaid Other | Admitting: Internal Medicine

## 2021-12-12 ENCOUNTER — Other Ambulatory Visit: Payer: Self-pay | Admitting: Interventional Cardiology

## 2021-12-12 DIAGNOSIS — I25119 Atherosclerotic heart disease of native coronary artery with unspecified angina pectoris: Secondary | ICD-10-CM

## 2021-12-12 DIAGNOSIS — Z006 Encounter for examination for normal comparison and control in clinical research program: Secondary | ICD-10-CM

## 2021-12-12 NOTE — Research (Addendum)
Patient was seen in the research clinic today for week 4 of [name](a) study. All medications reviewed and no medication changes noted at this time. Patient denies any adverse events or hospitalizations since the last visit. Lab work completed per protocol and patient tolerated without complaints. Week 12 visit made for June 23 @ 10 am. ? ?Current Outpatient Medications:  ?  acetaminophen (TYLENOL) 500 MG tablet, Take 500-1,000 mg by mouth every 6 (six) hours as needed for moderate pain. , Disp: , Rfl:  ?  albuterol (PROVENTIL HFA;VENTOLIN HFA) 108 (90 Base) MCG/ACT inhaler, Inhale 2 puffs into the lungs every 6 (six) hours as needed for wheezing or shortness of breath., Disp: 1 Inhaler, Rfl: 0 ?  clopidogrel (PLAVIX) 75 MG tablet, TAKE 1 TABLET BY MOUTH EVERY DAY, Disp: 90 tablet, Rfl: 0 ?  Continuous Blood Gluc Receiver (DEXCOM G6 RECEIVER) DEVI, AS DIRECTED 364 DAYS, Disp: , Rfl:  ?  Continuous Blood Gluc Sensor (FREESTYLE LIBRE 2 SENSOR) MISC, Apply topically every other day., Disp: , Rfl:  ?  cyclobenzaprine (FLEXERIL) 10 MG tablet, Take 1-2 tablets (10-20 mg total) by mouth at bedtime as needed for muscle spasms. Muscle spasms, Disp: 30 tablet, Rfl: 0 ?  folic acid (FOLVITE) 209 MCG tablet, Take 1,200 mcg by mouth daily., Disp: , Rfl:  ?  furosemide (LASIX) 40 MG tablet, TAKE 1 TABLET BY MOUTH EVERY DAY IN THE MORNING, Disp: 90 tablet, Rfl: 3 ?  insulin aspart (NOVOLOG) 100 UNIT/ML injection, Inject 4-16 Units into the skin 3 (three) times daily with meals. Per Sliding scale CBG 100-150: 4 units, 151-200: 6 units, 201-250: 8 units, 251-300: 10 units, 301-350: 12 units, 351-400: 14 units, > 401; call doctor, Disp: , Rfl:  ?  LANTUS SOLOSTAR 100 UNIT/ML Solostar Pen, Inject 10 Units into the skin 2 (two) times daily. , Disp: , Rfl: 3 ?  methotrexate (RHEUMATREX) 2.5 MG tablet, Take 10 mg by mouth every Wednesday., Disp: , Rfl:  ?  metoprolol tartrate (LOPRESSOR) 25 MG tablet, TAKE 1 TABLET BY MOUTH TWICE A DAY,  Disp: 180 tablet, Rfl: 3 ?  nitroGLYCERIN (NITROSTAT) 0.4 MG SL tablet, Place 1 tablet (0.4 mg total) under the tongue every 5 (five) minutes as needed for chest pain. Max 3 doses, call 911., Disp: 25 tablet, Rfl: 7 ?  ondansetron (ZOFRAN) 4 MG tablet, 1 tab by mouth every 6 hours as needed for nausea and vomiting., Disp: 12 tablet, Rfl: 0 ?  pantoprazole (PROTONIX) 40 MG tablet, Take 40 mg by mouth daily., Disp: , Rfl:  ?  prednisoLONE acetate (PRED FORTE) 1 % ophthalmic suspension, Place 4-5 drops into the left eye daily., Disp: , Rfl: 6 ?  rosuvastatin (CRESTOR) 40 MG tablet, TAKE 1 TABLET BY MOUTH EVERY DAY, Disp: 90 tablet, Rfl: 1 ?  sertraline (ZOLOFT) 100 MG tablet, 1 1/2 TABS BY MOUTH DAILY, Disp: 135 tablet, Rfl: 3 ?  Study - [name](A) - olpasiran (AMG 890) 142 mg/mL or placebo SQ injection (PI-Hilty), Inject 142 mg into the skin once. For Investigational Use Only. Inject 1 mL (1 prefilled syringe) subcutaneously into appropriate injection site per protocol. (Approved injection site(s): upper arm, upper thigh & abdomen). Please contact [facility] with any questions or concerns regarding this medication., Disp: , Rfl:  ?  traMADol (ULTRAM) 50 MG tablet, TAKE 1 TABLET EVERY 6 HOURS AS NEEDED FOR MODERATE PAIN, Disp: 90 tablet, Rfl: 2 ?  Accu-Chek FastClix Lancets MISC, , Disp: , Rfl:  ?  ACCU-CHEK GUIDE  test strip, , Disp: , Rfl:  ?  ammonium lactate (AMLACTIN) 12 % cream, Apply topically as needed for dry skin. (Patient not taking: Reported on 11/14/2021), Disp: 385 g, Rfl: 0 ?  Blood Glucose Monitoring Suppl (ACCU-CHEK GUIDE ME) w/Device KIT, , Disp: , Rfl:  ?  Cholecalciferol (VITAMIN D3) 10 MCG (400 UNIT) tablet, 2 tabs by mouth daily. (Patient not taking: Reported on 11/01/2021), Disp: 180 tablet, Rfl: 3 ?  Continuous Blood Gluc Transmit (DEXCOM G6 TRANSMITTER) MISC, AS DIRECTED 90 DAYS, Disp: , Rfl:  ?  diphenhydrAMINE (BENADRYL) 25 MG tablet, Take 25 mg by mouth every 6 (six) hours as needed for  itching or allergies. (Patient not taking: Reported on 11/14/2021), Disp: , Rfl:  ?  famotidine (PEPCID) 20 MG tablet, Take 20 mg by mouth 2 (two) times daily. (Patient not taking: Reported on 11/14/2021), Disp: , Rfl:  ?  gabapentin (NEURONTIN) 100 MG capsule, TAKE 1 CAPSULE BY MOUTH AT BEDTIME. (Patient not taking: Reported on 11/14/2021), Disp: 30 capsule, Rfl: 0 ?  Incontinence Supply Disposable (POISE ULTRA THINS) PADS, Use 5 pads daily as needed for urinary stress incontinence, Disp: 150 each, Rfl: 11 ?  Nicotine (NICOTROL NS) 10 MG/ML SOLN, 1-2 SPRAYS IN NOSTRIL PER HOUR TRY TO LIMIT TO 10 SPRAYS DAILY (Patient not taking: Reported on 09/27/2021), Disp: 10 mL, Rfl: 4 ?  traMADol (ULTRAM) 50 MG tablet, Take by mouth. Duplicate (Patient not taking: Reported on 11/01/2021), Disp: , Rfl:   ?

## 2021-12-15 DIAGNOSIS — Z006 Encounter for examination for normal comparison and control in clinical research program: Secondary | ICD-10-CM

## 2021-12-15 NOTE — Research (Addendum)
[name](a) lab results Wk 4  [phone]       These labs were routed to PCP and message sent also in Epic.  Are these clinically significant? [x]  YES              []  NO   Agree with your plan to notify PCP - findings suggestive of possible infection.  Chrystie Nose, MD, [facility], FACP  [place]  [facility]  Medical Director of the Advanced Lipid Disorders &  Cardiovascular Risk Reduction Clinic Diplomate of the American Board of Clinical Lipidology Attending Cardiologist  Direct Dial: [phone]  Fax: [phone]  Website:  [email or web address]

## 2021-12-16 NOTE — Research (Signed)
Results received from [facility]) trial. Message routed to PCP concerning the abnormal labs and staff message also sent to notify Dr Delrae Alfred.

## 2021-12-19 DIAGNOSIS — Z006 Encounter for examination for normal comparison and control in clinical research program: Secondary | ICD-10-CM

## 2021-12-19 NOTE — Research (Signed)
Called Dr Mulberry's office to follow up with staff message sent in regards to abnormal lab reports since I haven't heard back from them. MD was in with a patient and receptionist stated he would let her know.

## 2021-12-19 NOTE — Research (Deleted)
[name](A) WEEK 4 VISIT   [id number]  ABNORMAL LABS: TEST RESULT  ANC (SEGS AND BANDS ONLY) 14.44  ANION GAP 20         Are these clinically significant? []  YES              []  NO

## 2021-12-22 ENCOUNTER — Other Ambulatory Visit: Payer: Self-pay | Admitting: Internal Medicine

## 2021-12-28 ENCOUNTER — Other Ambulatory Visit: Payer: Self-pay | Admitting: Internal Medicine

## 2021-12-28 DIAGNOSIS — Z1231 Encounter for screening mammogram for malignant neoplasm of breast: Secondary | ICD-10-CM

## 2022-01-10 ENCOUNTER — Telehealth: Payer: Self-pay

## 2022-01-10 NOTE — Telephone Encounter (Signed)
Patient called to report rash on armpit area that started 01/08/22. Area looks bumpy, redness, and moist. Recently changed Deoderant (from dove to degree) and soap (from dove to camay), but has now switched back since she suspects that this is the cause. Has been using a cortisone cream and monistat to help stop the itching. Has also been using towels to keep area dry. Would like a recommendations to help relieve rash

## 2022-01-11 NOTE — Research (Addendum)
[name](a) LP(a) lab results  Are these clinically significant? [x]  YES              []  NO   Chrystie Nose, MD, [facility], FACP    [facility]  Medical Director of the Advanced Lipid Disorders &  Cardiovascular Risk Reduction Clinic Diplomate of the American Board of Clinical Lipidology Attending Cardiologist  Direct Dial: [phone]  Fax: [phone]  Website:  [email or web address]

## 2022-01-12 ENCOUNTER — Ambulatory Visit (INDEPENDENT_AMBULATORY_CARE_PROVIDER_SITE_OTHER): Payer: Medicaid Other | Admitting: Podiatry

## 2022-01-12 ENCOUNTER — Ambulatory Visit (INDEPENDENT_AMBULATORY_CARE_PROVIDER_SITE_OTHER): Payer: Medicaid Other | Admitting: Internal Medicine

## 2022-01-12 ENCOUNTER — Other Ambulatory Visit: Payer: Self-pay

## 2022-01-12 ENCOUNTER — Encounter: Payer: Self-pay | Admitting: Internal Medicine

## 2022-01-12 ENCOUNTER — Other Ambulatory Visit: Payer: Self-pay | Admitting: Internal Medicine

## 2022-01-12 VITALS — BP 116/60 | HR 72 | Resp 20 | Ht 61.0 in | Wt 183.0 lb

## 2022-01-12 DIAGNOSIS — M79674 Pain in right toe(s): Secondary | ICD-10-CM | POA: Diagnosis not present

## 2022-01-12 DIAGNOSIS — E1149 Type 2 diabetes mellitus with other diabetic neurological complication: Secondary | ICD-10-CM

## 2022-01-12 DIAGNOSIS — B351 Tinea unguium: Secondary | ICD-10-CM

## 2022-01-12 DIAGNOSIS — L739 Follicular disorder, unspecified: Secondary | ICD-10-CM | POA: Diagnosis not present

## 2022-01-12 DIAGNOSIS — M79675 Pain in left toe(s): Secondary | ICD-10-CM

## 2022-01-12 MED ORDER — MUPIROCIN 2 % EX OINT: TOPICAL_OINTMENT | CUTANEOUS | 0 refills | Status: DC

## 2022-01-12 MED ORDER — MUPIROCIN CALCIUM 2 % EX CREA
TOPICAL_CREAM | CUTANEOUS | 0 refills | Status: DC
Start: 2022-01-12 — End: 2022-01-12

## 2022-01-12 NOTE — Progress Notes (Signed)
Subjective:    Patient ID: [name] [name], female   DOB: 04-18-1966, 56 y.o.   MRN: [id number]   HPI  Itching in right axilla about 1 1/2 weeks ago.  Noted a rash. She remembered switching her soap to [place] from her usual [place] for about 1 week prior.  Switched back to [place] and seems to have made a difference--has noted improvement in past 4 days.    Onychomycosis:  right ring fingernail clearing with use of topical lotrimin  Current Meds  Medication Sig   acetaminophen (TYLENOL) 500 MG tablet Take 500-1,000 mg by mouth every 6 (six) hours as needed for moderate pain.    albuterol (PROVENTIL HFA;VENTOLIN HFA) 108 (90 Base) MCG/ACT inhaler Inhale 2 puffs into the lungs every 6 (six) hours as needed for wheezing or shortness of breath.   clopidogrel (PLAVIX) 75 MG tablet TAKE 1 TABLET BY MOUTH EVERY DAY   Continuous Blood Gluc Receiver (DEXCOM G6 RECEIVER) DEVI AS DIRECTED 364 DAYS   Continuous Blood Gluc Sensor (FREESTYLE LIBRE 2 SENSOR) MISC Apply topically every other day.   Continuous Blood Gluc Transmit (DEXCOM G6 TRANSMITTER) MISC AS DIRECTED 90 DAYS   cyclobenzaprine (FLEXERIL) 10 MG tablet Take 1-2 tablets (10-20 mg total) by mouth at bedtime as needed for muscle spasms. Muscle spasms   diphenhydrAMINE (BENADRYL) 25 MG tablet Take 25 mg by mouth every 6 (six) hours as needed for itching or allergies.   famotidine (PEPCID) 20 MG tablet Take 20 mg by mouth 2 (two) times daily.   folic acid (FOLVITE) 400 MCG tablet Take 1,200 mcg by mouth daily.   furosemide (LASIX) 40 MG tablet TAKE 1 TABLET BY MOUTH EVERY DAY IN THE MORNING   Incontinence Supply Disposable (POISE ULTRA THINS) PADS Use 5 pads daily as needed for urinary stress incontinence   insulin aspart (NOVOLOG) 100 UNIT/ML injection Inject 4-16 Units into the skin 3 (three) times daily with meals. Per Sliding scale CBG 100-150: 4 units, 151-200: 6 units, 201-250: 8 units, 251-300: 10 units, 301-350: 12 units, 351-400: 14 units, > 401;  call doctor   LANTUS SOLOSTAR 100 UNIT/ML Solostar Pen Inject 10 Units into the skin 2 (two) times daily.    methotrexate (RHEUMATREX) 2.5 MG tablet Take 10 mg by mouth every Wednesday.   metoprolol tartrate (LOPRESSOR) 25 MG tablet TAKE 1 TABLET BY MOUTH TWICE A DAY   Nicotine (NICOTROL NS) 10 MG/ML SOLN 1-2 SPRAYS IN NOSTRIL PER HOUR TRY TO LIMIT TO 10 SPRAYS DAILY   nitroGLYCERIN (NITROSTAT) 0.4 MG SL tablet PLACE 1 TABLET UNDER THE TONGUE EVERY 5 MINUTES AS NEEDED FOR CHEST PAIN. MAX 3 DOSES, CALL 911.   ondansetron (ZOFRAN) 4 MG tablet 1 tab by mouth every 6 hours as needed for nausea and vomiting.   pantoprazole (PROTONIX) 40 MG tablet Take 40 mg by mouth daily.   prednisoLONE acetate (PRED FORTE) 1 % ophthalmic suspension Place 4-5 drops into the left eye daily.   rosuvastatin (CRESTOR) 40 MG tablet TAKE 1 TABLET BY MOUTH EVERY DAY   sertraline (ZOLOFT) 100 MG tablet 1 1/2 TABS BY MOUTH DAILY   Study - [name](A) - olpasiran (AMG 890) 142 mg/mL or placebo SQ injection (PI-Hilty) Inject 142 mg into the skin once. For Investigational Use Only. Inject 1 mL (1 prefilled syringe) subcutaneously into appropriate injection site per protocol. (Approved injection site(s): upper arm, upper thigh & abdomen). Please contact [facility] with any questions or concerns regarding this medication.   traMADol Janean Sark)  50 MG tablet TAKE 1 TABLET EVERY 6 HOURS AS NEEDED FOR MODERATE PAIN   Allergies  Allergen Reactions   Aspirin Other (See Comments)    Stomach bleeds      Review of Systems    Objective:   BP 116/60 (BP Location: Right Arm, Patient Position: Sitting, Cuff Size: Normal)   Pulse 72   Resp 20   Ht 5\' 1"  (1.549 m)   Wt 183 lb (83 kg)   LMP 01/04/2015   BMI 34.58 kg/m   Physical Exam  Five superficial tiny pustules, each 1-2 mm on 3-4 mm base of inflammation scattered in right axilla.  She does not shave her axillae.  No findings of similar changes elsewhere on scan of  skin.   Assessment & Plan  Folliculitis--very mild Mupirocin cream 2 times daily to affected area for 5 days.  Not clear if any relations to using the [place] soap.

## 2022-01-14 NOTE — Progress Notes (Signed)
Subjective: 56 y.o. returns the office today for painful, elongated, thickened toenails which she cannot trim herself.  She has a callus at the lower left foot that needs to be trimmed.  No open lesions. Denies any fevers or chills.  No other concerns.   PCP: Julieanne Manson, MD-last seen January 12, 2022 Endocrinologist: Dr. Talmage Coin, MD-last seen October 06, 2021 A1c: 6.7 on 08/12/2020  Objective: AAO 3, NAD DP/PT pulses palpable, CRT less than 3 seconds Sensation decreased with Semmes Weinstein monofilament. Nails hypertrophic, dystrophic, elongated, brittle, discolored 10. There is tenderness overlying the nails 1-5 bilaterally. There is no surrounding erythema or drainage along the nail sites. Minimal hyperkeratotic tissue today and this is doing much better on the left foot.  There is hyperkeratotic tissue distal medial bilateral hallux without any underlying ulceration.  The right side there was a small amount dried blood but there is no ulceration. No open lesions Hammertoes present No pain with calf compression, swelling, warmth, erythema.  Assessment: Patient presents with symptomatic onychomycosis, hyperkeratotic lesions  Plan: -Treatment options including alternatives, risks, complications were discussed -Nails sharply debrided 10 without complication/bleeding. -Hyperkeratotic lesion sharply debrided x2 without any complications or bleeding. -Discussed daily foot inspection. If there are any changes, to call the office immediately.  -Follow-up in 3 months or sooner if any problems are to arise. In the meantime, encouraged to call the office with any questions, concerns, changes symptoms.  Ovid Curd, DPM

## 2022-01-15 ENCOUNTER — Other Ambulatory Visit: Payer: Self-pay | Admitting: Interventional Cardiology

## 2022-01-16 NOTE — Telephone Encounter (Signed)
Patient was seen by Dr Mulberry °

## 2022-01-18 MED ORDER — STUDY - OCEAN(A) - OLPASIRAN (AMG 890) 142 MG/ML OR PLACEBO SQ INJECTION (PI-HILTY): 142.0000 mg | PREFILLED_SYRINGE | Freq: Once | SUBCUTANEOUS | Status: DC

## 2022-01-19 NOTE — Telephone Encounter (Signed)
Rx has already been changed to ointment

## 2022-01-19 NOTE — Telephone Encounter (Signed)
Didn't you already take care of this?

## 2022-01-25 ENCOUNTER — Ambulatory Visit
Admission: RE | Admit: 2022-01-25 | Discharge: 2022-01-25 | Disposition: A | Discharge status: Home / Self Care | Payer: Medicaid Other | Source: Ambulatory Visit | Attending: Internal Medicine | Admitting: Internal Medicine

## 2022-01-25 DIAGNOSIS — Z1231 Encounter for screening mammogram for malignant neoplasm of breast: Secondary | ICD-10-CM

## 2022-02-03 DIAGNOSIS — Z006 Encounter for examination for normal comparison and control in clinical research program: Secondary | ICD-10-CM

## 2022-02-03 NOTE — Research (Signed)
Message left reminder patient of her appt on 7/10 and gate code left on voicemail

## 2022-02-06 VITALS — BP 124/62 | HR 70

## 2022-02-06 DIAGNOSIS — Z006 Encounter for examination for normal comparison and control in clinical research program: Secondary | ICD-10-CM

## 2022-02-06 MED ORDER — STUDY - OCEAN(A) - OLPASIRAN (AMG 890) 142 MG/ML OR PLACEBO SQ INJECTION (PI-HILTY)
142.0000 mg | PREFILLED_SYRINGE | Freq: Once | SUBCUTANEOUS | Status: AC
  Administered 2022-02-06: 142 mg via SUBCUTANEOUS
  Filled 2022-02-06: qty 1

## 2022-02-06 NOTE — Research (Signed)
[name](a) IP ADMINISTRATION Subject [id number] IP Box [id number] IP lot#: [id number] Time Given: 0737 Administration site: lt arm  Patient was seen in the research clinic today for week 12 visit of the [name](a) trial. Denies any recent adverse events or hospitalizations since her last visit. Reviewed all concomitant medications and no changes noted at this time. Lab work and IP given per protocol. Next appt for Week 24 made for Oct 5 @ 0830   Current Outpatient Medications:    Accu-Chek FastClix Lancets MISC, , Disp: , Rfl:    ACCU-CHEK GUIDE test strip, , Disp: , Rfl:    acetaminophen (TYLENOL) 500 MG tablet, Take 500-1,000 mg by mouth every 6 (six) hours as needed for moderate pain. , Disp: , Rfl:    albuterol (PROVENTIL HFA;VENTOLIN HFA) 108 (90 Base) MCG/ACT inhaler, Inhale 2 puffs into the lungs every 6 (six) hours as needed for wheezing or shortness of breath., Disp: 1 Inhaler, Rfl: 0   ammonium lactate (AMLACTIN) 12 % cream, Apply topically as needed for dry skin. (Patient not taking: Reported on 11/14/2021), Disp: 385 g, Rfl: 0   Blood Glucose Monitoring Suppl (ACCU-CHEK GUIDE ME) w/Device KIT, , Disp: , Rfl:    Cholecalciferol (VITAMIN D3) 10 MCG (400 UNIT) tablet, 2 tabs by mouth daily. (Patient not taking: Reported on 11/01/2021), Disp: 180 tablet, Rfl: 3   clopidogrel (PLAVIX) 75 MG tablet, TAKE 1 TABLET BY MOUTH EVERY DAY, Disp: 90 tablet, Rfl: 2   Continuous Blood Gluc Receiver (DEXCOM G6 RECEIVER) DEVI, AS DIRECTED 364 DAYS, Disp: , Rfl:    Continuous Blood Gluc Sensor (FREESTYLE LIBRE 2 SENSOR) MISC, Apply topically every other day., Disp: , Rfl:    Continuous Blood Gluc Transmit (DEXCOM G6 TRANSMITTER) MISC, AS DIRECTED 90 DAYS, Disp: , Rfl:    cyclobenzaprine (FLEXERIL) 10 MG tablet, Take 1-2 tablets (10-20 mg total) by mouth at bedtime as needed for muscle spasms. Muscle spasms, Disp: 30 tablet, Rfl: 0   diphenhydrAMINE (BENADRYL) 25 MG tablet, Take 25 mg by mouth every 6  (six) hours as needed for itching or allergies., Disp: , Rfl:    famotidine (PEPCID) 20 MG tablet, Take 20 mg by mouth 2 (two) times daily., Disp: , Rfl:    folic acid (FOLVITE) 106 MCG tablet, Take 1,200 mcg by mouth daily., Disp: , Rfl:    furosemide (LASIX) 40 MG tablet, TAKE 1 TABLET BY MOUTH EVERY DAY IN THE MORNING, Disp: 90 tablet, Rfl: 3   gabapentin (NEURONTIN) 100 MG capsule, TAKE 1 CAPSULE BY MOUTH AT BEDTIME. (Patient not taking: Reported on 11/14/2021), Disp: 30 capsule, Rfl: 0   Incontinence Supply Disposable (POISE ULTRA THINS) PADS, Use 5 pads daily as needed for urinary stress incontinence, Disp: 150 each, Rfl: 11   insulin aspart (NOVOLOG) 100 UNIT/ML injection, Inject 4-16 Units into the skin 3 (three) times daily with meals. Per Sliding scale CBG 100-150: 4 units, 151-200: 6 units, 201-250: 8 units, 251-300: 10 units, 301-350: 12 units, 351-400: 14 units, > 401; call doctor, Disp: , Rfl:    LANTUS SOLOSTAR 100 UNIT/ML Solostar Pen, Inject 10 Units into the skin 2 (two) times daily. , Disp: , Rfl: 3   methotrexate (RHEUMATREX) 2.5 MG tablet, Take 10 mg by mouth every Wednesday., Disp: , Rfl:    metoprolol tartrate (LOPRESSOR) 25 MG tablet, TAKE 1 TABLET BY MOUTH TWICE A DAY, Disp: 180 tablet, Rfl: 3   mupirocin ointment (BACTROBAN) 2 %, Appy to affected area 2 times daily for 5 days,  Disp: 15 g, Rfl: 0   Nicotine (NICOTROL NS) 10 MG/ML SOLN, 1-2 SPRAYS IN NOSTRIL PER HOUR TRY TO LIMIT TO 10 SPRAYS DAILY, Disp: 10 mL, Rfl: 4   nitroGLYCERIN (NITROSTAT) 0.4 MG SL tablet, PLACE 1 TABLET UNDER THE TONGUE EVERY 5 MINUTES AS NEEDED FOR CHEST PAIN. MAX 3 DOSES, CALL 911., Disp: 25 tablet, Rfl: 10   ondansetron (ZOFRAN) 4 MG tablet, 1 tab by mouth every 6 hours as needed for nausea and vomiting., Disp: 12 tablet, Rfl: 0   pantoprazole (PROTONIX) 40 MG tablet, Take 40 mg by mouth daily., Disp: , Rfl:    prednisoLONE acetate (PRED FORTE) 1 % ophthalmic suspension, Place 4-5 drops into the left  eye daily., Disp: , Rfl: 6   rosuvastatin (CRESTOR) 40 MG tablet, TAKE 1 TABLET BY MOUTH EVERY DAY, Disp: 90 tablet, Rfl: 3   sertraline (ZOLOFT) 100 MG tablet, 1 1/2 TABS BY MOUTH DAILY, Disp: 135 tablet, Rfl: 3   Study - [name](A) - olpasiran (AMG 890) 142 mg/mL or placebo SQ injection (PI-Hilty), Inject 142 mg into the skin once. For Investigational Use Only. Inject 1 mL (1 prefilled syringe) subcutaneously into appropriate injection site per protocol. (Approved injection site(s): upper arm, upper thigh & abdomen). Please contact [facility] with any questions or concerns regarding this medication., Disp: , Rfl:    traMADol (ULTRAM) 50 MG tablet, TAKE 1 TABLET EVERY 6 HOURS AS NEEDED FOR MODERATE PAIN, Disp: 90 tablet, Rfl: 2   traMADol (ULTRAM) 50 MG tablet, Take by mouth. Duplicate (Patient not taking: Reported on 11/01/2021), Disp: , Rfl:

## 2022-02-08 ENCOUNTER — Encounter: Payer: Self-pay | Admitting: Internal Medicine

## 2022-02-08 ENCOUNTER — Ambulatory Visit (INDEPENDENT_AMBULATORY_CARE_PROVIDER_SITE_OTHER): Payer: Medicaid Other | Admitting: Internal Medicine

## 2022-02-08 VITALS — BP 120/80 | HR 85 | Resp 14 | Ht 61.0 in | Wt 184.0 lb

## 2022-02-08 DIAGNOSIS — Z720 Tobacco use: Secondary | ICD-10-CM | POA: Diagnosis not present

## 2022-02-08 DIAGNOSIS — Z716 Tobacco abuse counseling: Secondary | ICD-10-CM

## 2022-02-08 DIAGNOSIS — Z639 Problem related to primary support group, unspecified: Secondary | ICD-10-CM | POA: Diagnosis not present

## 2022-02-08 DIAGNOSIS — L739 Follicular disorder, unspecified: Secondary | ICD-10-CM

## 2022-02-08 DIAGNOSIS — B351 Tinea unguium: Secondary | ICD-10-CM

## 2022-02-08 DIAGNOSIS — E118 Type 2 diabetes mellitus with unspecified complications: Secondary | ICD-10-CM

## 2022-02-08 DIAGNOSIS — E785 Hyperlipidemia, unspecified: Secondary | ICD-10-CM

## 2022-02-08 DIAGNOSIS — E108 Type 1 diabetes mellitus with unspecified complications: Secondary | ICD-10-CM

## 2022-02-08 MED ORDER — ONDANSETRON HCL 4 MG PO TABS: ORAL_TABLET | ORAL | 0 refills | Status: DC
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
## 2022-02-08 NOTE — Research (Addendum)
[name] week 12 Lab results     ABNORMAL LABS: TEST RESULT  Anion gap 22  ANC 7.95  neutrophil 7.95  WBC 10.89  MCV       99 RDW       17 Creatinine      103   Alk Phos      177  Are these clinically significant? _0  YES               _1 NO   Pixie Casino, MD, FACC, [place] Director of the [facility] Diplomate of the American Board of Clinical Lipidology Attending Cardiologist  Direct Dial: [phone]  Fax: [phone]  Website:  [email or web address]

## 2022-02-08 NOTE — Patient Instructions (Signed)
For nicotine spray:  stop smoking immediately. Get rid of all cigarettes, ash trays, and lighters the first day you use spray.

## 2022-02-09 ENCOUNTER — Encounter: Payer: Medicaid Other | Admitting: Internal Medicine

## 2022-02-19 ENCOUNTER — Emergency Department (HOSPITAL_COMMUNITY): Payer: Medicaid Other

## 2022-02-19 ENCOUNTER — Inpatient Hospital Stay (HOSPITAL_COMMUNITY)
Admission: EM | Admit: 2022-02-19 | Discharge: 2022-02-23 | DRG: 853 | Disposition: A | Discharge status: Home / Self Care | Payer: Medicaid Other | Attending: Internal Medicine | Admitting: Internal Medicine

## 2022-02-19 ENCOUNTER — Inpatient Hospital Stay (HOSPITAL_COMMUNITY): Payer: Medicaid Other

## 2022-02-19 ENCOUNTER — Encounter (HOSPITAL_COMMUNITY): Payer: Self-pay

## 2022-02-19 ENCOUNTER — Encounter (HOSPITAL_COMMUNITY)
Admission: EM | Disposition: A | Discharge status: Home / Self Care | Payer: Self-pay | Source: Home / Self Care | Attending: Family Medicine

## 2022-02-19 ENCOUNTER — Inpatient Hospital Stay (HOSPITAL_COMMUNITY): Payer: Medicaid Other | Admitting: Certified Registered Nurse Anesthetist

## 2022-02-19 ENCOUNTER — Other Ambulatory Visit: Payer: Self-pay

## 2022-02-19 DIAGNOSIS — R1012 Left upper quadrant pain: Secondary | ICD-10-CM | POA: Diagnosis present

## 2022-02-19 DIAGNOSIS — I130 Hypertensive heart and chronic kidney disease with heart failure and stage 1 through stage 4 chronic kidney disease, or unspecified chronic kidney disease: Secondary | ICD-10-CM | POA: Diagnosis present

## 2022-02-19 DIAGNOSIS — Z6836 Body mass index (BMI) 36.0-36.9, adult: Secondary | ICD-10-CM

## 2022-02-19 DIAGNOSIS — E1169 Type 2 diabetes mellitus with other specified complication: Secondary | ICD-10-CM | POA: Diagnosis present

## 2022-02-19 DIAGNOSIS — F319 Bipolar disorder, unspecified: Secondary | ICD-10-CM | POA: Diagnosis present

## 2022-02-19 DIAGNOSIS — E1122 Type 2 diabetes mellitus with diabetic chronic kidney disease: Secondary | ICD-10-CM | POA: Diagnosis present

## 2022-02-19 DIAGNOSIS — N200 Calculus of kidney: Principal | ICD-10-CM

## 2022-02-19 DIAGNOSIS — A419 Sepsis, unspecified organism: Secondary | ICD-10-CM | POA: Diagnosis not present

## 2022-02-19 DIAGNOSIS — Z955 Presence of coronary angioplasty implant and graft: Secondary | ICD-10-CM

## 2022-02-19 DIAGNOSIS — M069 Rheumatoid arthritis, unspecified: Secondary | ICD-10-CM | POA: Diagnosis present

## 2022-02-19 DIAGNOSIS — Z7902 Long term (current) use of antithrombotics/antiplatelets: Secondary | ICD-10-CM | POA: Diagnosis not present

## 2022-02-19 DIAGNOSIS — I152 Hypertension secondary to endocrine disorders: Secondary | ICD-10-CM | POA: Diagnosis present

## 2022-02-19 DIAGNOSIS — N179 Acute kidney failure, unspecified: Secondary | ICD-10-CM | POA: Diagnosis present

## 2022-02-19 DIAGNOSIS — E1069 Type 1 diabetes mellitus with other specified complication: Secondary | ICD-10-CM | POA: Diagnosis present

## 2022-02-19 DIAGNOSIS — F418 Other specified anxiety disorders: Secondary | ICD-10-CM | POA: Diagnosis present

## 2022-02-19 DIAGNOSIS — E662 Morbid (severe) obesity with alveolar hypoventilation: Secondary | ICD-10-CM | POA: Diagnosis present

## 2022-02-19 DIAGNOSIS — N132 Hydronephrosis with renal and ureteral calculous obstruction: Secondary | ICD-10-CM | POA: Diagnosis present

## 2022-02-19 DIAGNOSIS — J9601 Acute respiratory failure with hypoxia: Secondary | ICD-10-CM | POA: Diagnosis present

## 2022-02-19 DIAGNOSIS — N1831 Chronic kidney disease, stage 3a: Secondary | ICD-10-CM | POA: Diagnosis present

## 2022-02-19 DIAGNOSIS — Z8673 Personal history of transient ischemic attack (TIA), and cerebral infarction without residual deficits: Secondary | ICD-10-CM

## 2022-02-19 DIAGNOSIS — R6520 Severe sepsis without septic shock: Secondary | ICD-10-CM | POA: Diagnosis present

## 2022-02-19 DIAGNOSIS — Z800 Family history of malignant neoplasm of digestive organs: Secondary | ICD-10-CM

## 2022-02-19 DIAGNOSIS — Z79899 Other long term (current) drug therapy: Secondary | ICD-10-CM

## 2022-02-19 DIAGNOSIS — F419 Anxiety disorder, unspecified: Secondary | ICD-10-CM | POA: Diagnosis present

## 2022-02-19 DIAGNOSIS — Z794 Long term (current) use of insulin: Secondary | ICD-10-CM

## 2022-02-19 DIAGNOSIS — K219 Gastro-esophageal reflux disease without esophagitis: Secondary | ICD-10-CM | POA: Diagnosis present

## 2022-02-19 DIAGNOSIS — I5032 Chronic diastolic (congestive) heart failure: Secondary | ICD-10-CM | POA: Diagnosis present

## 2022-02-19 DIAGNOSIS — Z803 Family history of malignant neoplasm of breast: Secondary | ICD-10-CM

## 2022-02-19 DIAGNOSIS — Z886 Allergy status to analgesic agent status: Secondary | ICD-10-CM | POA: Diagnosis not present

## 2022-02-19 DIAGNOSIS — M797 Fibromyalgia: Secondary | ICD-10-CM

## 2022-02-19 DIAGNOSIS — I252 Old myocardial infarction: Secondary | ICD-10-CM

## 2022-02-19 DIAGNOSIS — I2510 Atherosclerotic heart disease of native coronary artery without angina pectoris: Secondary | ICD-10-CM | POA: Diagnosis present

## 2022-02-19 DIAGNOSIS — E785 Hyperlipidemia, unspecified: Secondary | ICD-10-CM | POA: Diagnosis present

## 2022-02-19 DIAGNOSIS — N136 Pyonephrosis: Secondary | ICD-10-CM | POA: Diagnosis present

## 2022-02-19 DIAGNOSIS — E559 Vitamin D deficiency, unspecified: Secondary | ICD-10-CM | POA: Diagnosis present

## 2022-02-19 DIAGNOSIS — J810 Acute pulmonary edema: Secondary | ICD-10-CM

## 2022-02-19 DIAGNOSIS — E876 Hypokalemia: Secondary | ICD-10-CM | POA: Diagnosis not present

## 2022-02-19 DIAGNOSIS — N12 Tubulo-interstitial nephritis, not specified as acute or chronic: Secondary | ICD-10-CM

## 2022-02-19 DIAGNOSIS — N201 Calculus of ureter: Secondary | ICD-10-CM

## 2022-02-19 DIAGNOSIS — I5033 Acute on chronic diastolic (congestive) heart failure: Secondary | ICD-10-CM | POA: Diagnosis present

## 2022-02-19 DIAGNOSIS — R0902 Hypoxemia: Secondary | ICD-10-CM

## 2022-02-19 DIAGNOSIS — Z8249 Family history of ischemic heart disease and other diseases of the circulatory system: Secondary | ICD-10-CM

## 2022-02-19 DIAGNOSIS — A4159 Other Gram-negative sepsis: Principal | ICD-10-CM | POA: Diagnosis present

## 2022-02-19 DIAGNOSIS — F17210 Nicotine dependence, cigarettes, uncomplicated: Secondary | ICD-10-CM | POA: Diagnosis present

## 2022-02-19 DIAGNOSIS — Z801 Family history of malignant neoplasm of trachea, bronchus and lung: Secondary | ICD-10-CM

## 2022-02-19 DIAGNOSIS — E119 Type 2 diabetes mellitus without complications: Secondary | ICD-10-CM

## 2022-02-19 DIAGNOSIS — I10 Essential (primary) hypertension: Secondary | ICD-10-CM | POA: Diagnosis present

## 2022-02-19 DIAGNOSIS — E1159 Type 2 diabetes mellitus with other circulatory complications: Secondary | ICD-10-CM | POA: Diagnosis present

## 2022-02-19 HISTORY — PX: CYSTOSCOPY W/ URETERAL STENT PLACEMENT: SHX1429

## 2022-02-19 HISTORY — DX: Severe sepsis without septic shock: R65.20

## 2022-02-19 HISTORY — DX: Sepsis, unspecified organism: A41.9

## 2022-02-19 LAB — COMPREHENSIVE METABOLIC PANEL
ALT: 8 U/L (ref 0–44)
AST: 15 U/L (ref 15–41)
Albumin: 3.8 g/dL (ref 3.5–5.0)
Alkaline Phosphatase: 118 U/L (ref 38–126)
Anion gap: 11 (ref 5–15)
BUN: 18 mg/dL (ref 6–20)
CO2: 26 mmol/L (ref 22–32)
Calcium: 9.6 mg/dL (ref 8.9–10.3)
Chloride: 104 mmol/L (ref 98–111)
Creatinine, Ser: 1.54 mg/dL — ABNORMAL HIGH (ref 0.44–1.00)
GFR, Estimated: 40 mL/min — ABNORMAL LOW (ref >60)
Glucose, Bld: 106 mg/dL — ABNORMAL HIGH (ref 70–99)
Potassium: 3.3 mmol/L — ABNORMAL LOW (ref 3.5–5.1)
Sodium: 141 mmol/L (ref 135–145)
Total Bilirubin: 1.4 mg/dL — ABNORMAL HIGH (ref 0.3–1.2)
Total Protein: 8.7 g/dL — ABNORMAL HIGH (ref 6.5–8.1)

## 2022-02-19 LAB — URINALYSIS, ROUTINE W REFLEX MICROSCOPIC
Bilirubin Urine: NEGATIVE
Glucose, UA: NEGATIVE mg/dL
Ketones, ur: 5 mg/dL — AB
Nitrite: NEGATIVE
Protein, ur: 100 mg/dL — AB
Specific Gravity, Urine: 1.019 (ref 1.005–1.030)
WBC, UA: >50 WBC/hpf — ABNORMAL HIGH (ref 0–5)
pH: 7 (ref 5.0–8.0)

## 2022-02-19 LAB — CBC WITH DIFFERENTIAL/PLATELET
Abs Immature Granulocytes: 0.31 10*3/uL — ABNORMAL HIGH (ref 0.00–0.07)
Basophils Absolute: 0.1 10*3/uL (ref 0.0–0.1)
Basophils Relative: 0 %
Eosinophils Absolute: 0 10*3/uL (ref 0.0–0.5)
Eosinophils Relative: 0 %
HCT: 46.5 % — ABNORMAL HIGH (ref 36.0–46.0)
Hemoglobin: 15 g/dL (ref 12.0–15.0)
Immature Granulocytes: 1 %
Lymphocytes Relative: 6 %
Lymphs Abs: 1.5 10*3/uL (ref 0.7–4.0)
MCH: 29.7 pg (ref 26.0–34.0)
MCHC: 32.3 g/dL (ref 30.0–36.0)
MCV: 92.1 fL (ref 80.0–100.0)
Monocytes Absolute: 2 10*3/uL — ABNORMAL HIGH (ref 0.1–1.0)
Monocytes Relative: 8 %
Neutro Abs: 21.8 10*3/uL — ABNORMAL HIGH (ref 1.7–7.7)
Neutrophils Relative %: 85 %
Platelets: 380 10*3/uL (ref 150–400)
RBC: 5.05 MIL/uL (ref 3.87–5.11)
RDW: 17.5 % — ABNORMAL HIGH (ref 11.5–15.5)
WBC: 25.6 10*3/uL — ABNORMAL HIGH (ref 4.0–10.5)
nRBC: 0 % (ref 0.0–0.2)

## 2022-02-19 LAB — GLUCOSE, CAPILLARY
Glucose-Capillary: 246 mg/dL — ABNORMAL HIGH (ref 70–99)
Glucose-Capillary: 213 mg/dL — ABNORMAL HIGH (ref 70–99)
Glucose-Capillary: 119 mg/dL — ABNORMAL HIGH (ref 70–99)

## 2022-02-19 LAB — LACTIC ACID, PLASMA
Lactic Acid, Venous: 2.1 mmol/L (ref 0.5–1.9)
Lactic Acid, Venous: 1.8 mmol/L (ref 0.5–1.9)

## 2022-02-19 LAB — LIPASE, BLOOD: Lipase: 27 U/L (ref 11–51)

## 2022-02-19 SURGERY — CYSTOSCOPY, WITH RETROGRADE PYELOGRAM AND URETERAL STENT INSERTION
Anesthesia: General | Laterality: Left

## 2022-02-19 MED ORDER — VANCOMYCIN HCL IN DEXTROSE 1-5 GM/200ML-% IV SOLN
1000.0000 mg | Freq: Once | INTRAVENOUS | Status: AC
  Administered 2022-02-19: 1000 mg via INTRAVENOUS
  Filled 2022-02-19: qty 200

## 2022-02-19 MED ORDER — ACETAMINOPHEN 325 MG PO TABS
650.0000 mg | ORAL_TABLET | Freq: Four times a day (QID) | ORAL | Status: DC | PRN
  Administered 2022-02-20 – 2022-02-22 (×7): 650 mg via ORAL
  Filled 2022-02-19 (×7): qty 2

## 2022-02-19 MED ORDER — HYDROMORPHONE HCL 1 MG/ML IJ SOLN
1.0000 mg | INTRAMUSCULAR | Status: DC | PRN
  Administered 2022-02-20 – 2022-02-23 (×4): 1 mg via INTRAVENOUS
  Filled 2022-02-19 (×5): qty 1

## 2022-02-19 MED ORDER — ACETAMINOPHEN 650 MG RE SUPP: 650.0000 mg | Freq: Four times a day (QID) | RECTAL | Status: DC | PRN

## 2022-02-19 MED ORDER — IOHEXOL 300 MG/ML  SOLN
100.0000 mL | Freq: Once | INTRAMUSCULAR | Status: AC | PRN
  Administered 2022-02-19: 75 mL via INTRAVENOUS

## 2022-02-19 MED ORDER — MIDAZOLAM HCL 2 MG/2ML IJ SOLN
INTRAMUSCULAR | Status: AC
  Filled 2022-02-19: qty 2

## 2022-02-19 MED ORDER — SODIUM CHLORIDE (PF) 0.9 % IJ SOLN
INTRAMUSCULAR | Status: AC
  Filled 2022-02-19: qty 50

## 2022-02-19 MED ORDER — ONDANSETRON HCL 4 MG/2ML IJ SOLN
INTRAMUSCULAR | Status: DC | PRN
  Administered 2022-02-19: 4 mg via INTRAVENOUS

## 2022-02-19 MED ORDER — INSULIN ASPART 100 UNIT/ML IJ SOLN
5.0000 [IU] | Freq: Once | INTRAMUSCULAR | Status: AC
  Administered 2022-02-19: 5 [IU] via SUBCUTANEOUS

## 2022-02-19 MED ORDER — FENTANYL CITRATE (PF) 100 MCG/2ML IJ SOLN
INTRAMUSCULAR | Status: DC | PRN
Start: 2022-02-19 — End: 2022-02-19
  Administered 2022-02-19: 50 ug via INTRAVENOUS

## 2022-02-19 MED ORDER — PHENYLEPHRINE 80 MCG/ML (10ML) SYRINGE FOR IV PUSH (FOR BLOOD PRESSURE SUPPORT)
PREFILLED_SYRINGE | INTRAVENOUS | Status: DC | PRN
  Administered 2022-02-19: 80 ug via INTRAVENOUS
  Administered 2022-02-19: 240 ug via INTRAVENOUS
  Administered 2022-02-19: 80 ug via INTRAVENOUS

## 2022-02-19 MED ORDER — INSULIN ASPART 100 UNIT/ML IJ SOLN
INTRAMUSCULAR | Status: AC
  Filled 2022-02-19: qty 1

## 2022-02-19 MED ORDER — DEXAMETHASONE SODIUM PHOSPHATE 10 MG/ML IJ SOLN
INTRAMUSCULAR | Status: DC | PRN
  Administered 2022-02-19: 10 mg via INTRAVENOUS

## 2022-02-19 MED ORDER — SODIUM CHLORIDE 0.9 % IV BOLUS
1000.0000 mL | Freq: Once | INTRAVENOUS | Status: AC
  Administered 2022-02-19: 1000 mL via INTRAVENOUS

## 2022-02-19 MED ORDER — SODIUM CHLORIDE 0.9 % IV SOLN
2.0000 g | INTRAVENOUS | Status: DC
  Administered 2022-02-19 – 2022-02-22 (×4): 2 g via INTRAVENOUS
  Filled 2022-02-19 (×4): qty 20

## 2022-02-19 MED ORDER — IBUPROFEN 200 MG PO TABS
600.0000 mg | ORAL_TABLET | Freq: Once | ORAL | Status: AC
Start: 2022-02-19 — End: 2022-02-19
  Administered 2022-02-19: 600 mg via ORAL
  Filled 2022-02-19: qty 3

## 2022-02-19 MED ORDER — SODIUM CHLORIDE 0.9% FLUSH
3.0000 mL | Freq: Two times a day (BID) | INTRAVENOUS | Status: DC
  Administered 2022-02-20 – 2022-02-22 (×5): 3 mL via INTRAVENOUS

## 2022-02-19 MED ORDER — MIDAZOLAM HCL 5 MG/5ML IJ SOLN
INTRAMUSCULAR | Status: DC | PRN
  Administered 2022-02-19: 2 mg via INTRAVENOUS

## 2022-02-19 MED ORDER — PROPOFOL 10 MG/ML IV BOLUS
INTRAVENOUS | Status: AC
  Filled 2022-02-19: qty 20

## 2022-02-19 MED ORDER — ONDANSETRON HCL 4 MG/2ML IJ SOLN
4.0000 mg | Freq: Four times a day (QID) | INTRAMUSCULAR | Status: DC | PRN
  Administered 2022-02-20 – 2022-02-22 (×4): 4 mg via INTRAVENOUS
  Filled 2022-02-19 (×6): qty 2

## 2022-02-19 MED ORDER — POTASSIUM CHLORIDE 10 MEQ/100ML IV SOLN
10.0000 meq | INTRAVENOUS | Status: AC
  Administered 2022-02-19 – 2022-02-20 (×3): 10 meq via INTRAVENOUS
  Filled 2022-02-19: qty 100

## 2022-02-19 MED ORDER — IOHEXOL 300 MG/ML  SOLN
INTRAMUSCULAR | Status: DC | PRN
  Administered 2022-02-19: 50 mL via URETHRAL

## 2022-02-19 MED ORDER — INSULIN ASPART 100 UNIT/ML IJ SOLN
10.0000 [IU] | Freq: Once | INTRAMUSCULAR | Status: AC
  Administered 2022-02-19: 10 [IU] via SUBCUTANEOUS

## 2022-02-19 MED ORDER — LACTATED RINGERS IV SOLN: INTRAVENOUS | Status: DC

## 2022-02-19 MED ORDER — ONDANSETRON HCL 4 MG PO TABS: 4.0000 mg | ORAL_TABLET | Freq: Four times a day (QID) | ORAL | Status: DC | PRN

## 2022-02-19 MED ORDER — FENTANYL CITRATE (PF) 100 MCG/2ML IJ SOLN
INTRAMUSCULAR | Status: AC
  Filled 2022-02-19: qty 2

## 2022-02-19 MED ORDER — STERILE WATER FOR IRRIGATION IR SOLN
Status: DC | PRN
  Administered 2022-02-19: 3000 mL

## 2022-02-19 MED ORDER — FENTANYL CITRATE PF 50 MCG/ML IJ SOSY: 25.0000 ug | PREFILLED_SYRINGE | INTRAMUSCULAR | Status: DC | PRN

## 2022-02-19 MED ORDER — SODIUM CHLORIDE 0.9 % IV SOLN: INTRAVENOUS | Status: DC

## 2022-02-19 MED ORDER — SENNOSIDES-DOCUSATE SODIUM 8.6-50 MG PO TABS: 1.0000 | ORAL_TABLET | Freq: Every evening | ORAL | Status: DC | PRN

## 2022-02-19 MED ORDER — LIDOCAINE 2% (20 MG/ML) 5 ML SYRINGE
INTRAMUSCULAR | Status: DC | PRN
  Administered 2022-02-19: 60 mg via INTRAVENOUS

## 2022-02-19 MED ORDER — OXYCODONE-ACETAMINOPHEN 5-325 MG PO TABS
1.0000 | ORAL_TABLET | Freq: Once | ORAL | Status: AC
  Administered 2022-02-19: 1 via ORAL
  Filled 2022-02-19: qty 1

## 2022-02-19 MED ORDER — PROPOFOL 10 MG/ML IV BOLUS
INTRAVENOUS | Status: DC | PRN
  Administered 2022-02-19: 120 mg via INTRAVENOUS

## 2022-02-19 MED ORDER — ACETAMINOPHEN 325 MG PO TABS
650.0000 mg | ORAL_TABLET | Freq: Once | ORAL | Status: AC
  Administered 2022-02-19: 650 mg via ORAL
  Filled 2022-02-19: qty 2

## 2022-02-19 MED ORDER — LACTATED RINGERS IV SOLN: INTRAVENOUS | Status: DC | PRN

## 2022-02-19 MED ORDER — PIPERACILLIN-TAZOBACTAM 3.375 G IVPB
3.3750 g | Freq: Three times a day (TID) | INTRAVENOUS | Status: DC
  Administered 2022-02-19: 3.375 g via INTRAVENOUS
  Filled 2022-02-19: qty 50

## 2022-02-19 SURGICAL SUPPLY — 13 items
BAG URO CATCHER STRL LF (MISCELLANEOUS) ×2 IMPLANT
CATH URETL OPEN 5X70 (CATHETERS) ×1 IMPLANT
CLOTH BEACON ORANGE TIMEOUT ST (SAFETY) ×2 IMPLANT
GLOVE SURG SS PI 8.0 STRL IVOR (GLOVE) IMPLANT
GOWN STRL REUS W/ TWL XL LVL3 (GOWN DISPOSABLE) ×1 IMPLANT
GOWN STRL REUS W/TWL XL LVL3 (GOWN DISPOSABLE) ×2
GUIDEWIRE STR DUAL SENSOR (WIRE) ×2 IMPLANT
MANIFOLD NEPTUNE II (INSTRUMENTS) ×2 IMPLANT
PACK CYSTO (CUSTOM PROCEDURE TRAY) ×2 IMPLANT
STENT URET 6FRX22 CONTOUR (STENTS) ×1 IMPLANT
TRAY FOLEY MTR SLVR 14FR STAT (SET/KITS/TRAYS/PACK) ×1 IMPLANT
TUBING CONNECTING 10 (TUBING) ×2 IMPLANT
TUBING UROLOGY SET (TUBING) IMPLANT

## 2022-02-19 NOTE — Assessment & Plan Note (Signed)
Presenting with fever, tachycardia, tachypnea, leukocytosis, lactic acidosis, and AKI.  Severe sepsis secondary to UTI/pyelonephritis due to obstructing left ureteral calculus. -Continue IV ceftriaxone -Follow blood and urine cultures -Urology planning for left ureteral stenting tonight, percutaneous nephrostomy tube if not successful -S/p 2 L NS bolus, BP and lactic acid improved.  Hold further fluids for now as she is at risk for volume overload in setting of HFpEF

## 2022-02-19 NOTE — Anesthesia Preprocedure Evaluation (Addendum)
Anesthesia Evaluation  Patient identified by MRN, date of birth, ID band Patient awake    Reviewed: Allergy & Precautions, NPO status , Patient's Chart, lab work & pertinent test results  Airway Mallampati: II  TM Distance: >3 FB Neck ROM: Full    Dental  (+) Upper Dentures   Pulmonary neg pulmonary ROS, Current Smoker,    Pulmonary exam normal        Cardiovascular hypertension, + CAD, + Past MI (2010), + Cardiac Stents (on Plavix) and +CHF   Rhythm:Regular Rate:Normal     Neuro/Psych  Headaches, Anxiety Depression Bipolar Disorder CVA (2014), No Residual Symptoms    GI/Hepatic Neg liver ROS, PUD, GERD  ,  Endo/Other  diabetes, Type 2, Insulin Dependent  Renal/GU ARF and CRFRenal diseaseInfected renal stone with sepsis     Musculoskeletal  (+) Arthritis , Fibromyalgia -  Abdominal Normal abdominal exam  (+)   Peds  Hematology  (+) Blood dyscrasia, anemia ,   Anesthesia Other Findings   Reproductive/Obstetrics                             Anesthesia Physical Anesthesia Plan  ASA: 3 and emergent  Anesthesia Plan: General   Post-op Pain Management:    Induction: Intravenous  PONV Risk Score and Plan: 2 and Ondansetron, Dexamethasone and Treatment may vary due to age or medical condition  Airway Management Planned: Mask and LMA  Additional Equipment: None  Intra-op Plan:   Post-operative Plan: Extubation in OR  Informed Consent: I have reviewed the patients History and Physical, chart, labs and discussed the procedure including the risks, benefits and alternatives for the proposed anesthesia with the patient or authorized representative who has indicated his/her understanding and acceptance.     Dental advisory given  Plan Discussed with: CRNA  Anesthesia Plan Comments: (Lab Results      Component                Value               Date                      WBC                       25.6 (H)            02/19/2022                HGB                      15.0                02/19/2022                HCT                      46.5 (H)            02/19/2022                MCV                      92.1                02/19/2022                PLT  380                 02/19/2022           Lab Results      Component                Value               Date                      NA                       141                 02/19/2022                K                        3.3 (L)             02/19/2022                CO2                      26                  02/19/2022                GLUCOSE                  106 (H)             02/19/2022                BUN                      18                  02/19/2022                CREATININE               1.54 (H)            02/19/2022                CALCIUM                  9.6                 02/19/2022                EGFR                     58 (L)              09/14/2021                GFRNONAA                 40 (L)              02/19/2022            ECHO: 1. Left ventricular ejection fraction, by estimation, is 55 to 60%. The  left ventricle has normal function. The left ventricle has no regional  wall motion abnormalities. Left ventricular diastolic parameters were  normal.  2. Right ventricular systolic function is normal. The right ventricular  size is mildly  enlarged. Tricuspid regurgitation signal is inadequate for  assessing PA pressure.  3. Left atrial size was mildly dilated.  4. Right atrial size was mildly dilated.  5. The mitral valve is normal in structure. Trivial mitral valve  regurgitation.  6. The aortic valve is tricuspid. Aortic valve regurgitation is not  visualized. No aortic stenosis is present.  7. The inferior vena cava is normal in size with <50% respiratory  variability, suggesting right atrial pressure of 8 mmHg. )       Anesthesia Quick Evaluation

## 2022-02-19 NOTE — ED Provider Notes (Signed)
[name] DEPT Provider Note   CSN: [id number] Arrival date & time: 02/19/22  1152     History  Chief Complaint  Patient presents with   Abdominal Pain   Cough    Demita [name] is a 56 y.o. female.  Patient presents to ER chief complaint of left-sided abdominal pain and a cough with fevers.  Symptoms ongoing for 2 days.  Denies any vomiting or diarrhea.       Home Medications Prior to Admission medications   Medication Sig Start Date End Date Taking? Authorizing Provider  Accu-Chek FastClix Lancets MISC  12/24/18   [provider]  ACCU-CHEK GUIDE test strip  12/23/18   [provider]  acetaminophen (TYLENOL) 500 MG tablet Take 500-1,000 mg by mouth every 6 (six) hours as needed for moderate pain.     [provider]  albuterol (PROVENTIL HFA;VENTOLIN HFA) 108 (90 Base) MCG/ACT inhaler Inhale 2 puffs into the lungs every 6 (six) hours as needed for wheezing or shortness of breath. 12/01/16   Mack Hook, MD  ammonium lactate (AMLACTIN) 12 % cream Apply topically as needed for dry skin. Patient not taking: Reported on 11/14/2021 09/15/19   Trula Slade, DPM  Blood Glucose Monitoring Suppl (ACCU-CHEK GUIDE ME) w/Device KIT  12/26/18   [provider]  Cholecalciferol (VITAMIN D3) 10 MCG (400 UNIT) tablet 2 tabs by mouth daily. Patient not taking: Reported on 11/01/2021 07/06/21   Mack Hook, MD  clopidogrel (PLAVIX) 75 MG tablet TAKE 1 TABLET BY MOUTH EVERY DAY 01/16/22   Jettie Booze, MD  Continuous Blood Gluc Receiver (DEXCOM G6 RECEIVER) [place] AS DIRECTED 364 DAYS 04/08/21   [provider]  Continuous Blood Gluc Sensor (FREESTYLE LIBRE 2 SENSOR) MISC Apply topically every other day. 12/29/20   [provider]  Continuous Blood Gluc Transmit (DEXCOM G6 TRANSMITTER) MISC AS DIRECTED 90 DAYS 04/08/21   [provider]  cyclobenzaprine (FLEXERIL) 10 MG tablet Take 1-2 tablets  (10-20 mg total) by mouth at bedtime as needed for muscle spasms. Muscle spasms Patient not taking: Reported on 02/08/2022 03/02/21   Hughie Closs, PA-C  diphenhydrAMINE (BENADRYL) 25 MG tablet Take 25 mg by mouth every 6 (six) hours as needed for itching or allergies.    [provider]  famotidine (PEPCID) 20 MG tablet Take 20 mg by mouth 2 (two) times daily. 09/11/20   [provider]  folic acid (FOLVITE) 008 MCG tablet Take 1,200 mcg by mouth daily.    [provider]  furosemide (LASIX) 40 MG tablet TAKE 1 TABLET BY MOUTH EVERY DAY IN THE MORNING 04/18/21   Mack Hook, MD  gabapentin (NEURONTIN) 100 MG capsule TAKE 1 CAPSULE BY MOUTH AT BEDTIME. Patient not taking: Reported on 11/14/2021 10/13/19   Trula Slade, DPM  Incontinence Supply Disposable (POISE ULTRA THINS) PADS Use 5 pads daily as needed for urinary stress incontinence 01/03/16   Mack Hook, MD  insulin aspart (NOVOLOG) 100 UNIT/ML injection Inject 4-16 Units into the skin 3 (three) times daily with meals. Per Sliding scale CBG 100-150: 4 units, 151-200: 6 units, 201-250: 8 units, 251-300: 10 units, 301-350: 12 units, 351-400: 14 units, > 401; call doctor    [provider]  LANTUS SOLOSTAR 100 UNIT/ML Solostar Pen Inject 10 Units into the skin 2 (two) times daily.  01/15/18   [provider]  methotrexate (RHEUMATREX) 2.5 MG tablet Take 10 mg by mouth every Wednesday. 04/21/19   [provider]  metoprolol tartrate (LOPRESSOR) 25 MG tablet TAKE 1 TABLET BY MOUTH TWICE A DAY 05/13/21   Mack Hook, MD  mupirocin ointment (BACTROBAN) 2 % Appy to affected area 2 times daily for 5 days Patient not taking: Reported on 02/08/2022 01/12/22   Mack Hook, MD  Nicotine (NICOTROL NS) 10 MG/ML SOLN 1-2 SPRAYS IN NOSTRIL PER HOUR TRY TO LIMIT TO 10 SPRAYS DAILY 03/09/21   Mack Hook, MD  nitroGLYCERIN (NITROSTAT) 0.4 MG SL tablet PLACE 1 TABLET UNDER  THE TONGUE EVERY 5 MINUTES AS NEEDED FOR CHEST PAIN. MAX 3 DOSES, CALL 911. 12/14/21   Jettie Booze, MD  ondansetron [facility]) 4 MG tablet 1 tab by mouth every 6 hours as needed for nausea and vomiting. 02/08/22   Mack Hook, MD  pantoprazole (PROTONIX) 40 MG tablet Take 40 mg by mouth daily.    [provider]  prednisoLONE acetate (PRED FORTE) 1 % ophthalmic suspension Place 4-5 drops into the left eye daily. 04/22/17   [provider]  rosuvastatin (CRESTOR) 40 MG tablet TAKE 1 TABLET BY MOUTH EVERY DAY 12/27/21   Mack Hook, MD  sertraline (ZOLOFT) 100 MG tablet 1 1/2 TABS BY MOUTH DAILY 05/13/21   Mack Hook, MD  Study - [name](A) - olpasiran (AMG 890) 142 mg/mL or placebo SQ injection (PI-[place]) Inject 142 mg into the skin once. For Investigational Use Only. Inject 1 mL (1 prefilled syringe) subcutaneously into appropriate injection site per protocol. (Approved injection site(s): upper arm, upper thigh & abdomen). Please contact [facility] with any questions or concerns regarding this medication.    Pixie Casino, MD  traMADol (ULTRAM) 50 MG tablet TAKE 1 TABLET EVERY 6 HOURS AS NEEDED FOR MODERATE PAIN 03/06/17   Mack Hook, MD  traMADol (ULTRAM) 50 MG tablet Take by mouth. Duplicate Patient not taking: Reported on 11/01/2021 02/28/21   [provider]      Allergies    Aspirin    Review of Systems   Review of Systems  Constitutional:  Negative for fever.  HENT:  Negative for ear pain.   Eyes:  Negative for pain.  Respiratory:  Negative for cough.   Cardiovascular:  Negative for chest pain.  Gastrointestinal:  Positive for abdominal pain.  Genitourinary:  Negative for flank pain.  Musculoskeletal:  Negative for back pain.  Skin:  Negative for rash.  Neurological:  Negative for headaches.    Physical Exam Updated Vital Signs BP (!) 113/55   Pulse (!) 111   Temp (!) 102.4 F (39.1 C) (Oral)   Resp (!) 22    Ht _0  (1.549 m)   Wt 83.9 kg   LMP 01/04/2015   SpO2 98%   BMI 34.96 kg/m  Physical Exam Constitutional:      General: She is not in acute distress.    Appearance: Normal appearance.  HENT:     Head: Normocephalic.     Nose: Nose normal.  Eyes:     Extraocular Movements: Extraocular movements intact.  Cardiovascular:     Rate and Rhythm: Normal rate.  Pulmonary:     Effort: Pulmonary effort is normal.  Abdominal:     Tenderness: There is abdominal tenderness in the left lower quadrant.  Musculoskeletal:        General: Normal range of motion.     Cervical back: Normal range of motion.  Neurological:     General: No focal deficit present.     Mental Status: She is alert.  Mental status is at baseline.     ED Results / Procedures / Treatments   Labs (all labs ordered are listed, but only abnormal results are displayed) Labs Reviewed  COMPREHENSIVE METABOLIC PANEL - Abnormal; Notable for the following components:      Result Value   Potassium 3.3 (*)    Glucose, Bld 106 (*)    Creatinine, Ser 1.54 (*)    Total Protein 8.7 (*)    Total Bilirubin 1.4 (*)    GFR, Estimated 40 (*)    All other components within normal limits  CBC WITH DIFFERENTIAL/PLATELET - Abnormal; Notable for the following components:   WBC 25.6 (*)    HCT 46.5 (*)    RDW 17.5 (*)    Neutro Abs 21.8 (*)    Monocytes Absolute 2.0 (*)    Abs Immature Granulocytes 0.31 (*)    All other components within normal limits  LACTIC ACID, PLASMA - Abnormal; Notable for the following components:   Lactic Acid, Venous 2.1 (*)    All other components within normal limits  URINALYSIS, ROUTINE W REFLEX MICROSCOPIC - Abnormal; Notable for the following components:   APPearance CLOUDY (*)    Hgb urine dipstick MODERATE (*)    Ketones, ur 5 (*)    Protein, ur 100 (*)    Leukocytes,Ua LARGE (*)    WBC, UA >50 (*)    Bacteria, UA MANY (*)    All other components within normal limits  CULTURE, BLOOD (ROUTINE X  2)  CULTURE, BLOOD (ROUTINE X 2)  URINE CULTURE  LIPASE, BLOOD  LACTIC ACID, PLASMA    EKG None  Radiology DG Chest Port 1 View  Result Date: 02/19/2022 CLINICAL DATA:  Hypoxemia EXAM: PORTABLE CHEST 1 VIEW COMPARISON:  None Available. FINDINGS: Cardiomegaly. Mild, diffuse bilateral interstitial pulmonary opacity. The visualized skeletal structures are unremarkable. IMPRESSION: Cardiomegaly with mild, diffuse bilateral interstitial pulmonary opacity, likely edema. No focal airspace opacity. Electronically Signed   By: Delanna Ahmadi M.D.   On: 02/19/2022 18:31   CT Abdomen Pelvis W Contrast  Result Date: 02/19/2022 CLINICAL DATA:  Abdominal pain. EXAM: CT ABDOMEN AND PELVIS WITH CONTRAST TECHNIQUE: Multidetector CT imaging of the abdomen and pelvis was performed using the standard protocol following bolus administration of intravenous contrast. RADIATION DOSE REDUCTION: This exam was performed according to the departmental dose-optimization program which includes automated exposure control, adjustment of the mA and/or kV according to patient size and/or use of iterative reconstruction technique. CONTRAST:  24m OMNIPAQUE IOHEXOL 300 MG/ML  SOLN COMPARISON:  09/02/2019 FINDINGS: Lower chest: Atelectasis noted within the left lower lobe. Hepatobiliary: No focal liver abnormality identified. Gallbladder appears normal. No bile duct dilatation. Pancreas: Unremarkable. No pancreatic ductal dilatation or surrounding inflammatory changes. Spleen: Normal in size without focal abnormality. Adrenals/Urinary Tract: Normal adrenal glands. Multiple right renal calcifications are identified within the upper pole which are likely vascular in etiology. Within the inferior pole of the right kidney there are 2 small calcifications which may be vascular or represent small kidney stones. These measure up to 3 mm. There is no right-sided hydronephrosis or mass. Multiple left renal calcifications are identified which  represent a combination of vascular calcifications and kidney stones. One large stone versus conglomeration of stones noted in the inferior pole of the left kidney measuring 8 mm. There is asymmetric left-sided nephromegaly with perinephric fat stranding and hydronephrosis. Left-sided hydroureter is identified to the level of the left UVJ. At the left UVJ there is a 6  mm stone, image 72/2. Stomach/Bowel: Stomach is within normal limits. Appendix appears normal. No evidence of bowel wall thickening, distention, or inflammatory changes. Vascular/Lymphatic: Aortic atherosclerosis. Enlarged left periaortic lymph node measures 1.2 cm, image 33/2. This is compared with 0.7 cm previously. No pelvic or inguinal adenopathy. Reproductive: Multiple calcified uterine fibroids are identified. No adnexal mass. Other: There is a small volume of free fluid extending along the left pericolic gutter and left retroperitoneum. Musculoskeletal: No acute or significant osseous findings. IMPRESSION: 1. Left-sided hydronephrosis and hydroureter to the level of the left UVJ where there is a 6 mm stone. There is asymmetric left-sided perinephric fat stranding and fluid as well as periureteral soft tissue stranding. Superimposed pyelonephritis cannot be excluded. 2. Multiple left renal calcifications which represent a combination of vascular calcifications and kidney stones. 3. Small volume of free fluid extending along the left pericolic gutter and left retroperitoneum. 4. Enlarged left periaortic lymph node is nonspecific, but favored to be reactive in etiology. 5. Calcified uterine fibroids. 6. Aortic Atherosclerosis (ICD10-I70.0). Electronically Signed   By: Kerby Moors M.D.   On: 02/19/2022 17:14    Procedures .Critical Care  Performed by: Luna Fuse, MD Authorized by: Luna Fuse, MD   Critical care provider statement:    Critical care time (minutes):  40   Critical care time was exclusive of:  Separately billable  procedures and treating other patients and teaching time   Critical care was necessary to treat or prevent imminent or life-threatening deterioration of the following conditions:  Respiratory failure Comments:     Patient with infected kidney stone and sepsis.  During fluid resuscitation developed failure hypoxemia and pulmonary edema requiring oxygen support.     Medications Ordered in ED Medications  piperacillin-tazobactam (ZOSYN) IVPB 3.375 g (3.375 g Intravenous New Bag/Given 02/19/22 1741)  vancomycin (VANCOCIN) IVPB 1000 mg/200 mL premix (has no administration in time range)  oxyCODONE-acetaminophen (PERCOCET/ROXICET) 5-325 MG per tablet 1 tablet (1 tablet Oral Given 02/19/22 1231)  sodium chloride 0.9 % bolus 1,000 mL (0 mLs Intravenous Stopped 02/19/22 1708)  ibuprofen (ADVIL) tablet 600 mg (600 mg Oral Given 02/19/22 1706)  acetaminophen (TYLENOL) tablet 650 mg (650 mg Oral Given 02/19/22 1707)  iohexol (OMNIPAQUE) 300 MG/ML solution 100 mL (75 mLs Intravenous Contrast Given 02/19/22 1647)  sodium chloride (PF) 0.9 % injection (  Given by Other 02/19/22 1645)  sodium chloride 0.9 % bolus 1,000 mL (1,000 mLs Intravenous New Bag/Given 02/19/22 1759)    ED Course/ Medical Decision Making/ A&P                           Medical Decision Making Amount and/or Complexity of Data Reviewed Labs: ordered. Radiology: ordered.  Risk OTC drugs. Prescription drug management.   Patient with 2 days of fevers tachycardia.  Concerning for sepsis.  Sepsis work-up initiated.  Review of records shows office visit February 08, 2022 for onychomycosis.  Cardiac monitoring showing sinus rhythm tachycardic rate.  Labs show white count 25 lactic acid mildly elevated as well.  Patient given 2 L bolus of IV fluids.  Started on Zosyn and vancomycin.  CT abdomen pelvis concerning for 6 mm kidney stone on the left with hydronephrosis, and hydroureter at the UVJ.  Consultation with  urology.        Final Clinical Impression(s) / ED Diagnoses Final diagnoses:  Kidney stone  Pyelonephritis  Acute pulmonary edema ([place])  Hypoxemia    Rx /  DC Orders ED Discharge Orders     None         [place], Greggory Brandy, MD 02/19/22 Bosie Helper

## 2022-02-19 NOTE — Transfer of Care (Signed)
Immediate Anesthesia Transfer of Care Note  Patient: [name] [name]  Procedure(s) Performed: CYSTOSCOPY WITH URETERAL STENT PLACEMENT (Left)  Patient Location: PACU  Anesthesia Type:General  Level of Consciousness: awake, alert  and oriented  Airway & Oxygen Therapy: Patient Spontanous Breathing and Patient connected to face mask oxygen  Post-op Assessment: Report given to RN and Post -op Vital signs reviewed and stable  Post vital signs: Reviewed and stable  Last Vitals:  Vitals Value Taken Time  BP 87/45 02/19/22 2115  Temp    Pulse 86 02/19/22 2116  Resp 29 02/19/22 2116  SpO2 96 % 02/19/22 2116  Vitals shown include unvalidated device data.  Last Pain:  Vitals:   02/19/22 2014  TempSrc:   PainSc: 0-No pain         Complications: No notable events documented.

## 2022-02-19 NOTE — H&P (Signed)
History and Physical    [name] [name] WOE:[id number] DOB: 1965/11/19 DOA: 02/19/2022  PCP: Mack Hook, MD  Patient coming from: Home via EMS  I have personally briefly reviewed patient's old medical records in [place]  Chief Complaint: Left flank pain, fevers  HPI: [name] [name] is a 56 y.o. female with medical history significant for multivessel CAD (s/p DES to mid and distal RCA 2012, DES to mid LAD 2022), HFpEF (EF 55-60% by TTE 08/12/2020), CKD stage IIIa, rheumatoid arthritis on methotrexate, T2DM, HTN, HLD, depression/anxiety, tobacco use who presented to the ED for evaluation of left flank pain and fevers.  Patient reports new onset left flank pain for 1 day.  She has had associated dysuria with decreased urine output.  She has had nausea, vomiting, fevers, chills, diaphoresis.  She denies any diarrhea, chest pain, dyspnea.  ED Course  Labs/Imaging on admission: I have personally reviewed following labs and imaging studies.  Initial vitals showed BP 114/56, pulse 88, RR 18, temp 103.1 F, SPO2 90% on room air.  While in the ED patient was tachycardic with pulse up to 120, tachypneic with RR up to 31.  Labs show WBC 25.6, hemoglobin 15.0, platelets 380,000, sodium 141, potassium 3.3, bicarb 26, BUN 18, creatinine 1.54 (baseline 1.1), serum glucose 106, AST 15, ALT 8, alk phos 118, total bilirubin 1.4, lipase 27, lactic acid 2.1 > 1.8.  Urinalysis shows negative nitrates, large leukocytes, 21-50 RBC/hpf, >50 WBC/hpf, many bacteria microscopy.  Urine and blood cultures in process.  CT abdomen/pelvis with contrast shows left-sided hydronephrosis and hydroureter to the level of the left UVJ where there is a 6 mm stone.  Multiple left renal calcifications, small volume free fluid along the left paracolic gutter and left retroperitoneum, enlarged left periaortic lymph node, and calcified uterine fibroids noted.  Portable chest x-ray shows cardiomegaly with mild diffuse bilateral  interstitial pulmonary edema.  Patient was given 2 L normal saline, IV vancomycin and Zosyn, Tylenol, ibuprofen, Percocet.  Urology, Dr. Hinton Rao, was contacted and will see in consultation and recommended medical admission.  The hospitalist service was consulted to admit for further evaluation and management.  Review of Systems: All systems reviewed and are negative except as documented in history of present illness above.   Past Medical History:  Diagnosis Date   Acute myocardial infarction of other lateral wall, initial episode of care    Acute myocardial infarction, unspecified site, initial episode of care    Acute osteomyelitis    Allergic rhinitis    Anemia    Anginal pain ([place])    07/15/13- no chest pain in months"   Anxiety    Bipolar affective ([place])    CAD (coronary artery disease) 07/2011   s/p DES mid and distal RCA with 50% LAD   CKD stage 1 due to type 2 diabetes mellitus (HCC)    Daily headache    not daily   Depression    Bipolar disorder   Diabetic foot ulcer associated with diabetes mellitus due to underlying condition ([place]) 09/26/2018   Diabetic gastroparesis associated with type 2 diabetes mellitus ([place])    Diabetic peripheral neuropathy associated with type 2 diabetes mellitus ([place])    Diabetic retinopathy    Esophageal stenosis    Esophageal ulcer    Esophagitis    Gastroparesis    Genital herpes    Reportedly tested and documented by [facility]--rare occurrences   GERD (gastroesophageal reflux disease)    Heart murmur  History of stomach ulcers    Hyperlipidemia    Hypertension    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  "Years ago"   Type II diabetes mellitus ([place])    Previously uncontrolled for many years with multiple complications.  2017 controlled. 05/2016:  6.7%   Vitamin D deficiency     Past Surgical History:  Procedure Laterality Date   CORONARY ANGIOPLASTY WITH STENT PLACEMENT  01/20/2009   "2" (12/19/2012)   CORONARY ANGIOPLASTY WITH STENT PLACEMENT  2012   "2" (12/19/2012)   CORONARY ANGIOPLASTY WITH STENT PLACEMENT  12/19/2012   "2" (12/19/2012)   CORONARY STENT INTERVENTION N/A 08/13/2020   Procedure: CORONARY STENT INTERVENTION;  Surgeon: Nelva Bush, MD;  Location: [place] [facility];  Service: Cardiovascular;  Laterality: N/A;   ESOPHAGOGASTRODUODENOSCOPY N/A 02/26/2015   Procedure: ESOPHAGOGASTRODUODENOSCOPY (EGD);  Surgeon: Teena Irani, MD;  Location[facility] ENDOSCOPY;  Service: Endoscopy;  Laterality: N/A;   EYE SURGERY     Multiple surgeries of both eyes:  last laser was 09/08/2015 of right eye.  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   GAS INSERTION Left 07/16/2013   Procedure: INSERTION OF GAS;  Surgeon: Adonis Brook, MD;  Location: [place];  Service: Ophthalmology;  Laterality: Left;  SF6   GAS/FLUID EXCHANGE Left 07/30/2013   Procedure: GAS/FLUID EXCHANGE;  Surgeon: Adonis Brook, MD;  Location: [place];  Service: Ophthalmology;  Laterality: Left;   INTRAVASCULAR PRESSURE WIRE/FFR STUDY N/A 08/13/2020   Procedure: INTRAVASCULAR PRESSURE WIRE/FFR STUDY;  Surgeon: Nelva Bush, MD;  Location: [place] [facility];  Service: Cardiovascular;  Laterality: N/A;   IRRIGATION AND DEBRIDEMENT SEBACEOUS CYST Right 03/2011   "pointer" (12/19/2012)   LEFT HEART CATH AND CORONARY ANGIOGRAPHY N/A 08/13/2020   Procedure: LEFT HEART CATH AND CORONARY ANGIOGRAPHY;  Surgeon: Nelva Bush, MD;  Location: [place] [facility];  Service: Cardiovascular;  Laterality: N/A;   LEFT HEART  CATHETERIZATION WITH CORONARY ANGIOGRAM N/A 07/06/2011   Procedure: LEFT HEART CATHETERIZATION WITH CORONARY ANGIOGRAM;  Surgeon: Jettie Booze, MD;  Location: [facility] CATH LAB;  Service: Cardiovascular;  Laterality: N/A;  possible PCI   LEFT HEART CATHETERIZATION WITH CORONARY ANGIOGRAM N/A 12/19/2012   Procedure: LEFT HEART CATHETERIZATION WITH CORONARY ANGIOGRAM;  Surgeon: Jettie Booze, MD;  Location: [facility] CATH LAB;  Service: Cardiovascular;  Laterality: N/A;   MEMBRANE PEEL Left 07/16/2013   Procedure: MEMBRANE PEEL;  Surgeon: Adonis Brook, MD;  Location: [place];  Service: Ophthalmology;  Laterality: Left;   PARS PLANA VITRECTOMY Left 07/16/2013   Procedure: PARS PLANA VITRECTOMY WITH 23 GAUGE;  Surgeon: Adonis Brook, MD;  Location: [place];  Service: Ophthalmology;  Laterality: Left;   PARS PLANA VITRECTOMY Left 07/30/2013   Procedure: PARS PLANA VITRECTOMY WITH 23 GAUGE WITH ENDOLASER;  Surgeon: Adonis Brook, MD;  Location: [facility];  Service: Ophthalmology;  Laterality: Left;  with endolaser   PERCUTANEOUS CORONARY STENT INTERVENTION (PCI-S) N/A 07/06/2011   Procedure: PERCUTANEOUS CORONARY STENT INTERVENTION (PCI-S);  Surgeon: Jettie Booze, MD;  Location[facility] CATH LAB;  Service: Cardiovascular;  Laterality: N/A;   PHOTOCOAGULATION WITH LASER Left 07/16/2013   Procedure: PHOTOCOAGULATION WITH LASER;  Surgeon: Adonis Brook, MD;  Location: [place];  Service: Ophthalmology;  Laterality: Left;  ENDOLASER    Social History:  reports that she has been smoking cigarettes. She has a 11.00 pack-year smoking history. She has never used smokeless tobacco. She reports current alcohol use. She reports that she does not currently use drugs after having used the following drugs: "Crack" cocaine and Marijuana.  Allergies  Allergen Reactions   Aspirin Other (See Comments)    Stomach bleeds     Family History  Problem Relation Age of Onset   Breast cancer Mother 66   Hypertension Father     Stomach cancer Sister 94       cause of death   Hypertension Sister    Lung cancer Sister 37       Cause of death   Cancer Sister 72       ? stomach cancer   Colon cancer Neg Hx      Prior to Admission medications   Medication Sig Start Date End Date Taking? Authorizing Provider  acetaminophen (TYLENOL) 500 MG tablet Take 500-1,000 mg by mouth every 6 (six) hours as needed for moderate pain.    Yes [provider]  albuterol (PROVENTIL HFA;VENTOLIN HFA) 108 (90 Base) MCG/ACT inhaler Inhale 2 puffs into the lungs every 6 (six) hours as needed for wheezing or shortness of breath. 12/01/16  Yes Mack Hook, MD  ammonium lactate (AMLACTIN) 12 % cream Apply topically as needed for dry skin. 09/15/19  Yes Trula Slade, DPM  cyclobenzaprine (FLEXERIL) 10 MG tablet Take 1-2 tablets (10-20 mg total) by mouth at bedtime as needed for muscle spasms. Muscle spasms 03/02/21  Yes Nelwyn Salisbury M, PA-C  famotidine (PEPCID) 20 MG tablet Take 20 mg by mouth 2 (two) times daily. 09/11/20  Yes [provider]  folic acid (FOLVITE) 878 MCG tablet Take 1,200 mcg by mouth daily.   Yes [provider]  furosemide (LASIX) 40 MG tablet TAKE 1 TABLET BY MOUTH EVERY DAY IN THE MORNING Patient taking differently: Take 40 mg by mouth daily. 04/18/21  Yes Mack Hook, MD  insulin aspart (NOVOLOG) 100 UNIT/ML injection Inject 4-16 Units into the skin 3 (three) times daily with meals. Per Sliding scale CBG 100-150: 4 units, 151-200: 6 units, 201-250: 8 units, 251-300: 10 units, 301-350: 12 units, 351-400: 14 units, > 401; call doctor   Yes [provider]  LANTUS SOLOSTAR 100 UNIT/ML Solostar Pen Inject 10 Units into the skin 2 (two) times daily.  01/15/18  Yes [provider]  metoprolol tartrate (LOPRESSOR) 25 MG tablet TAKE 1 TABLET BY MOUTH TWICE A DAY Patient taking differently: Take 12.5 mg by mouth 2 (two) times daily. 05/13/21  Yes Mack Hook, MD   nitroGLYCERIN (NITROSTAT) 0.4 MG SL tablet PLACE 1 TABLET UNDER THE TONGUE EVERY 5 MINUTES AS NEEDED FOR CHEST PAIN. MAX 3 DOSES, CALL 911. 12/14/21  Yes Jettie Booze, MD  ondansetron [facility]) 4 MG tablet 1 tab by mouth every 6 hours as needed for nausea and vomiting. 02/08/22  Yes Mack Hook, MD  pantoprazole (PROTONIX) 40 MG tablet Take 40 mg by mouth daily.   Yes [provider]  prednisoLONE acetate (PRED FORTE) 1 % ophthalmic suspension Place 4-5 drops into the left eye daily. 04/22/17  Yes [provider]  rosuvastatin (CRESTOR) 40 MG tablet TAKE 1 TABLET BY MOUTH EVERY DAY Patient taking differently: Take 40 mg by mouth daily. 12/27/21  Yes Mack Hook, MD  sertraline (ZOLOFT) 100 MG tablet 1 1/2 TABS BY MOUTH DAILY Patient taking differently: Take 150 mg by mouth daily. 05/13/21  Yes Mack Hook, MD  traMADol (ULTRAM) 50 MG tablet TAKE 1 TABLET EVERY 6 HOURS AS NEEDED FOR MODERATE PAIN Patient taking differently: Take 50 mg by mouth every 6 (six) hours as needed for moderate pain. 03/06/17  Yes Mack Hook, MD  Accu-Chek FastClix Lancets [place]  12/24/18   [provider]  ACCU-CHEK GUIDE test strip  12/23/18   [provider]  Blood Glucose Monitoring Suppl (ACCU-CHEK GUIDE ME) w/Device KIT  12/26/18   [provider]  Cholecalciferol (VITAMIN D3) 10 MCG (400 UNIT) tablet 2 tabs by mouth daily. Patient not taking: Reported on 11/01/2021 07/06/21   Mack Hook, MD  clopidogrel (PLAVIX) 75 MG tablet TAKE 1 TABLET BY MOUTH EVERY DAY Patient taking differently: Take 75 mg by mouth daily. 01/16/22   Jettie Booze, MD  Continuous Blood Gluc Receiver (DEXCOM G6 RECEIVER) [place] AS DIRECTED 364 DAYS 04/08/21   [provider]  Continuous Blood Gluc Sensor (FREESTYLE LIBRE 2 SENSOR) MISC Apply topically every other day. 12/29/20   [provider]  Continuous Blood Gluc Transmit (DEXCOM G6  TRANSMITTER) MISC AS DIRECTED 90 DAYS 04/08/21   [provider]  gabapentin (NEURONTIN) 100 MG capsule TAKE 1 CAPSULE BY MOUTH AT BEDTIME. Patient not taking: Reported on 11/14/2021 10/13/19   Trula Slade, DPM  Incontinence Supply Disposable (POISE ULTRA THINS) PADS Use 5 pads daily as needed for urinary stress incontinence 01/03/16   Mack Hook, MD  methotrexate (RHEUMATREX) 2.5 MG tablet Take 10 mg by mouth every Wednesday. 04/21/19   [provider]  mupirocin ointment (BACTROBAN) 2 % Appy to affected area 2 times daily for 5 days Patient not taking: Reported on 02/08/2022 01/12/22   Mack Hook, MD  Nicotine (NICOTROL NS) 10 MG/ML SOLN 1-2 SPRAYS IN NOSTRIL PER HOUR TRY TO LIMIT TO 10 SPRAYS DAILY Patient not taking: Reported on 02/19/2022 03/09/21   Mack Hook, MD    Physical Exam: Vitals:   02/19/22 2215 02/19/22 2230 02/19/22 2256 02/19/22 2316  BP: (!) 115/52 (!) 117/50 (!) 115/55 (!) 116/55  Pulse: 77 78 83 80  Resp: (!) _0 Temp: (!) 97.3 F (36.3 C)  (!) 97.4 F (36.3 C) 99.1 F (37.3 C)  TempSrc:    Axillary  SpO2: 92% 92% 95% 96%  Weight:      Height:       Constitutional: Resting in bed, initially appeared somewhat uncomfortable but mood improved during conversation and she was answering questions appropriately Eyes: EOMI, lids and conjunctivae normal ENMT: Mucous membranes are dry. Posterior pharynx clear of any exudate or lesions.Normal dentition.  Neck: normal, supple, no masses. Respiratory: clear to auscultation bilaterally, no wheezing, no crackles. Normal respiratory effort while on 2 L O2 via [place]. No accessory muscle use.  Cardiovascular: Tachycardic, no murmurs / rubs / gallops. No extremity edema. 2+ pedal pulses. Abdomen: Left-sided tenderness, no masses palpated. Musculoskeletal: no clubbing / cyanosis. No joint deformity upper and lower extremities. Good ROM, no contractures. Normal muscle tone.  Skin: no  rashes, lesions, ulcers. No induration Neurologic: Sensation intact. Strength 5/5 in all 4.  Psychiatric:  Alert and oriented x 3.  EKG: Not  performed.  Assessment/Plan Principal Problem:   Severe sepsis (HCC) Active Problems:   Left hydronephrosis and hydroureter with urinary obstruction due to infected ureteral calculus   Acute renal failure superimposed on stage 3a chronic kidney disease (HCC)   Chronic heart failure with preserved ejection fraction (HFpEF) (HCC)   CAD S/P percutaneous coronary angioplasty   Insulin dependent type 2 diabetes mellitus ([place])   Hypertension associated with diabetes ([place])   Rheumatoid arthritis ([place])   Hyperlipidemia associated with type 2 diabetes mellitus ([place])   Depression with anxiety   [name] [name] is a 56 y.o. female with medical history significant for multivessel CAD (s/p DES to mid and distal RCA 2012, DES to mid LAD 2022), HFpEF (EF 55-60% by TTE 08/12/2020), CKD stage IIIa, rheumatoid arthritis on methotrexate, T2DM, HTN, HLD, depression/anxiety, tobacco use who is admitted with severe sepsis due to infected obstructing left ureteral stone.  Assessment and Plan: * Severe sepsis ([place]) Presenting with fever, tachycardia, tachypnea, leukocytosis, lactic acidosis, and AKI.  Severe sepsis secondary to UTI/pyelonephritis due to obstructing left ureteral calculus. -Continue IV ceftriaxone -Follow blood and urine cultures -Urology planning for left ureteral stenting tonight, percutaneous nephrostomy tube if not successful -S/p 2 L NS bolus, BP and lactic acid improved.  Hold further fluids for now as she is at risk for volume overload in setting of HFpEF  Left hydronephrosis and hydroureter with urinary obstruction due to infected ureteral calculus Urology consulted and taken for planned left ureteral stenting tonight.  If unsuccessful plan is to pursue percutaneous nephrostomy tube. -Continue empiric IV ceftriaxone -Follow-up blood and urine  cultures  Chronic heart failure with preserved ejection fraction (HFpEF) (HCC) EF 55-60% by TTE 08/12/2020.  Mild pulmonary edema noted on CXR after initial sepsis fluid resuscitation. -Plan to resume home Lasix 40 mg daily tomorrow morning as long as BP allows -Monitor strict I/O's and daily weights  Acute renal failure superimposed on stage 3a chronic kidney disease (HCC) Creatinine 1.54 on admission compared to baseline of 1.1.  AKI in setting of ureteral obstruction and severe sepsis.  Anticipate improvement with management as above.  Repeat labs in AM.  CAD S/P percutaneous coronary angioplasty S/p prior DES to RCA and mid LAD.  Stable, denies any recent chest pain. -Resume home Plavix and rosuvastatin tomorrow  Insulin dependent type 2 diabetes mellitus ([place]) Place on SSI.  Hypertension associated with diabetes ([place]) Resume home Lopressor once BP stabilizes.  Depression with anxiety Continue sertraline.  Hyperlipidemia associated with type 2 diabetes mellitus (HCC) Continue rosuvastatin.  Rheumatoid arthritis ([place]) Hold home methotrexate.  DVT prophylaxis: enoxaparin (LOVENOX) injection 40 mg Start: 02/20/22 2200 SCDs Start: 02/19/22 1950 Code Status: Full code, confirmed with patient on admission Family Communication: Daughter by phone Disposition Plan: From home and likely discharge to home pending clinical progress Consults called: Urology Severity of Illness: The appropriate patient status for this patient is INPATIENT. Inpatient status is judged to be reasonable and necessary in order to provide the required intensity of service to ensure the patient's safety. The patient's presenting symptoms, physical exam findings, and initial radiographic and laboratory data in the context of their chronic comorbidities is felt to place them at [place] risk for further clinical deterioration. Furthermore, it is not anticipated that the patient will be medically stable for discharge from  the hospital within 2 midnights of admission.   * I certify that at the point of admission it is my clinical judgment that the patient will require inpatient hospital care spanning  beyond 2 midnights from the point of admission due to [place] intensity of service, [place] risk for further deterioration and [place] frequency of surveillance required.Zada Finders MD [facility]  If 7PM-7AM, please contact night-coverage [email or web address]  02/20/2022, 12:11 AM

## 2022-02-19 NOTE — ED Provider Triage Note (Signed)
Emergency Medicine Provider Triage Evaluation Note  [name] [name] , a 57 y.o. female  was evaluated in triage.  Pt complains of left upper quadrant abdominal pain.  She states it seems like she is having a pancreatitis flare.  Review of Systems  Positive: Upper abdominal pain with minimal vomiting. Negative: No diarrhea or fever  Physical Exam  LMP 01/04/2015  Gen:   Awake, uncomfortable Resp:  Normal effort  MSK:   Moves extremities without difficulty  Other:  Abdomen soft mild left upper quadrant tenderness  Medical Decision Making  Medically screening exam initiated at 12:25 PM.  Appropriate orders placed.  [name] [name] was informed that the remainder of the evaluation will be completed by another provider, this initial triage assessment does not replace that evaluation, and the importance of remaining in the ED until their evaluation is complete.  We will evaluate for pain [place] and treat symptomatically in the meantime   Mancel Bale, MD 02/19/22 1227

## 2022-02-19 NOTE — Anesthesia Procedure Notes (Signed)
Procedure Name: LMA Insertion Date/Time: 02/19/2022 8:53 PM  Performed by: Kizzie Fantasia, CRNAPre-anesthesia Checklist: Patient identified, Emergency Drugs available, Suction available, Patient being monitored and Timeout performed Patient Re-evaluated:Patient Re-evaluated prior to induction Oxygen Delivery Method: Circle system utilized Preoxygenation: Pre-oxygenation with 100% oxygen Induction Type: IV induction Ventilation: Mask ventilation without difficulty LMA: LMA inserted LMA Size: 4.0 Number of attempts: 1 Placement Confirmation: positive ETCO2 and breath sounds checked- equal and bilateral Tube secured with: Tape Dental Injury: Teeth and Oropharynx as per pre-operative assessment

## 2022-02-19 NOTE — Consult Note (Signed)
Urology Consult Note   Requesting Attending Physician:  Luna Fuse, MD Service Providing Consult: Urology  Consulting Attending: Dr. Irine Seal   Reason for Consult:  infected kidney stone  HPI: [name] [name] is seen in consultation for reasons noted above at the request of Hong, Greggory Brandy, MD for evaluation of infected kidney stone.  This is a 56 y.o. female with diabetes, HTN, coronary artery disease, and HLD who presents with left upper quadrant abdominal pain for 2 days with fever. Found to have a LEFT 43m ureterovesical junction stone with upstream hydronephrosis. Creatinine 1.5 from prior baseline of ~1.2, leukocytosis to 25.6, UA with large leukocyte esterase, negative nitrite, pyuria, and many bacteria. Tmax 103 in ED, tachycardic, blood pressure stable.  She has had prior UTIs but no stones or GU surgery.  She is on plavix.    Past Medical History: Past Medical History:  Diagnosis Date   Acute myocardial infarction of other lateral wall, initial episode of care    Acute myocardial infarction, unspecified site, initial episode of care    Acute osteomyelitis    Allergic rhinitis    Anemia    Anginal pain ([facility]    07/15/13- no chest pain in months"   Anxiety    Bipolar affective ([facility]    CAD (coronary artery disease) 07/2011   s/p DES mid and distal RCA with 50% LAD   CKD stage 1 due to type 2 diabetes mellitus (HCC)    Daily headache    not daily   Depression    Bipolar disorder   Diabetic foot ulcer associated with diabetes mellitus due to underlying condition ([facility] 09/26/2018   Diabetic gastroparesis associated with type 2 diabetes mellitus ([facility]    Diabetic peripheral neuropathy associated with type 2 diabetes mellitus ([facility]    Diabetic retinopathy    Esophageal stenosis    Esophageal ulcer    Esophagitis    Gastroparesis    Genital herpes    Reportedly tested and documented by [facility]--rare occurrences   GERD (gastroesophageal reflux disease)    Heart  murmur    History of stomach ulcers    Hyperlipidemia    Hypertension    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  "Years ago"   Type II diabetes mellitus ([facility]    Previously uncontrolled for many years with multiple complications.  2017 controlled. 05/2016:  6.7%   Vitamin D deficiency     Past Surgical History:  Past Surgical History:  Procedure Laterality Date   CORONARY ANGIOPLASTY WITH STENT PLACEMENT  01/20/2009   "2" (12/19/2012)   CORONARY ANGIOPLASTY WITH STENT PLACEMENT  2012   "2" (12/19/2012)   CORONARY ANGIOPLASTY WITH STENT PLACEMENT  12/19/2012   "2" (12/19/2012)   CORONARY STENT INTERVENTION N/A 08/13/2020   Procedure: CORONARY STENT INTERVENTION;  Surgeon: ENelva Bush MD;  [facility];  Service: Cardiovascular;  Laterality: N/A;   ESOPHAGOGASTRODUODENOSCOPY N/A 02/26/2015   Procedure: ESOPHAGOGASTRODUODENOSCOPY (EGD);  Surgeon: JJenny Reichmann  Amedeo Plenty, MD;  Location: [place];  Service: Endoscopy;  Laterality: N/A;   EYE SURGERY     Multiple surgeries of both eyes:  last laser was 09/08/2015 of right eye.  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   GAS INSERTION Left 07/16/2013   Procedure: INSERTION OF GAS;  Surgeon: Adonis Brook, MD;  Location: [place];  Service: Ophthalmology;  Laterality: Left;  SF6   GAS/FLUID EXCHANGE Left 07/30/2013   Procedure: GAS/FLUID EXCHANGE;  Surgeon: Adonis Brook, MD;  Location: [place];  Service: Ophthalmology;  Laterality: Left;   INTRAVASCULAR PRESSURE WIRE/FFR STUDY N/A 08/13/2020    Procedure: INTRAVASCULAR PRESSURE WIRE/FFR STUDY;  Surgeon: Nelva Bush, MD;  Location: [place] [facility];  Service: Cardiovascular;  Laterality: N/A;   IRRIGATION AND DEBRIDEMENT SEBACEOUS CYST Right 03/2011   "pointer" (12/19/2012)   LEFT HEART CATH AND CORONARY ANGIOGRAPHY N/A 08/13/2020   Procedure: LEFT HEART CATH AND CORONARY ANGIOGRAPHY;  Surgeon: Nelva Bush, MD;  Location: [place] [facility];  Service: Cardiovascular;  Laterality: N/A;   LEFT HEART CATHETERIZATION WITH CORONARY ANGIOGRAM N/A 07/06/2011   Procedure: LEFT HEART CATHETERIZATION WITH CORONARY ANGIOGRAM;  Surgeon: Jettie Booze, MD;  Location: [facility] CATH LAB;  Service: Cardiovascular;  Laterality: N/A;  possible PCI   LEFT HEART CATHETERIZATION WITH CORONARY ANGIOGRAM N/A 12/19/2012   Procedure: LEFT HEART CATHETERIZATION WITH CORONARY ANGIOGRAM;  Surgeon: Jettie Booze, MD;  Location: [facility] C[facility] LAB;  Service: Cardiovascular;  Laterality: N/A;   MEMBRANE PEEL Left 07/16/2013   Procedure: MEMBRANE PEEL;  Surgeon: Adonis Brook, MD;  Location: [place];  Service: Ophthalmology;  Laterality: Left;   PARS PLANA VITRECTOMY Left 07/16/2013   Procedure: PARS PLANA VITRECTOMY WITH 23 GAUGE;  Surgeon: Adonis Brook, MD;  Location: [place];  Service: Ophthalmology;  Laterality: Left;   PARS PLANA VITRECTOMY Left 07/30/2013   Procedure: PARS PLANA VITRECTOMY WITH 23 GAUGE WITH ENDOLASER;  Surgeon: Adonis Brook, MD;  Location: [place];  Service: Ophthalmology;  Laterality: Left;  with endolaser   PERCUTANEOUS CORONARY STENT INTERVENTION (PCI-S) N/A 07/06/2011   Procedure: PERCUTANEOUS CORONARY STENT INTERVENTION (PCI-S);  Surgeon: Jettie Booze, MD;  Location: [facility] C[facility] LAB;  Service: Cardiovascular;  Laterality: N/A;   PHOTOCOAGULATION WITH LASER Left 07/16/2013   Procedure: PHOTOCOAGULATION WITH LASER;  Surgeon: Adonis Brook, MD;  Location: [place];  Service: Ophthalmology;  Laterality: Left;  ENDOLASER    Medication: Current  Facility-Administered Medications  Medication Dose Route Frequency Provider Last Rate Last Admin   piperacillin-tazobactam (ZOSYN) IVPB 3.375 g  3.375 g Intravenous Q8H Wofford, Drew A, RPH 12.5 mL/hr at 02/19/22 1741 3.375 g at 02/19/22 1741   vancomycin (VANCOCIN) IVPB 1000 mg/200 mL premix  1,000 mg Intravenous Once Polly Cobia, RPH 200 mL/hr at 02/19/22 1852 1,000 mg at 02/19/22 2563   Current Outpatient Medications  Medication Sig Dispense Refill   Accu-Chek FastClix Lancets MISC      ACCU-CHEK GUIDE test strip  (Patient not taking: Reported on 01/12/2022)     acetaminophen (TYLENOL) 500 MG tablet Take 500-1,000 mg by mouth every 6 (six) hours as needed for moderate pain.      albuterol (PROVENTIL HFA;VENTOLIN HFA) 108 (90 Base) MCG/ACT inhaler Inhale 2 puffs into the lungs every 6 (six) hours as needed for wheezing or shortness of breath. 1 Inhaler 0   ammonium lactate (AMLACTIN) 12 % cream Apply topically as needed for dry skin. (Patient not taking: Reported on 11/14/2021) 385 g 0  Blood Glucose Monitoring Suppl (ACCU-CHEK GUIDE ME) w/Device KIT      Cholecalciferol (VITAMIN D3) 10 MCG (400 UNIT) tablet 2 tabs by mouth daily. (Patient not taking: Reported on 11/01/2021) 180 tablet 3   clopidogrel (PLAVIX) 75 MG tablet TAKE 1 TABLET BY MOUTH EVERY DAY 90 tablet 2   Continuous Blood Gluc Receiver (DEXCOM G6 RECEIVER) DEVI AS DIRECTED 364 DAYS     Continuous Blood Gluc Sensor (FREESTYLE LIBRE 2 SENSOR) MISC Apply topically every other day.     Continuous Blood Gluc Transmit (DEXCOM G6 TRANSMITTER) MISC AS DIRECTED 90 DAYS     cyclobenzaprine (FLEXERIL) 10 MG tablet Take 1-2 tablets (10-20 mg total) by mouth at bedtime as needed for muscle spasms. Muscle spasms (Patient not taking: Reported on 02/08/2022) 30 tablet 0   diphenhydrAMINE (BENADRYL) 25 MG tablet Take 25 mg by mouth every 6 (six) hours as needed for itching or allergies.     famotidine (PEPCID) 20 MG tablet Take 20 mg by mouth 2  (two) times daily.     folic acid (FOLVITE) 825 MCG tablet Take 1,200 mcg by mouth daily.     furosemide (LASIX) 40 MG tablet TAKE 1 TABLET BY MOUTH EVERY DAY IN THE MORNING 90 tablet 3   gabapentin (NEURONTIN) 100 MG capsule TAKE 1 CAPSULE BY MOUTH AT BEDTIME. (Patient not taking: Reported on 11/14/2021) 30 capsule 0   Incontinence Supply Disposable (POISE ULTRA THINS) PADS Use 5 pads daily as needed for urinary stress incontinence 150 each 11   insulin aspart (NOVOLOG) 100 UNIT/ML injection Inject 4-16 Units into the skin 3 (three) times daily with meals. Per Sliding scale CBG 100-150: 4 units, 151-200: 6 units, 201-250: 8 units, 251-300: 10 units, 301-350: 12 units, 351-400: 14 units, > 401; call doctor     LANTUS SOLOSTAR 100 UNIT/ML Solostar Pen Inject 10 Units into the skin 2 (two) times daily.   3   methotrexate (RHEUMATREX) 2.5 MG tablet Take 10 mg by mouth every Wednesday.     metoprolol tartrate (LOPRESSOR) 25 MG tablet TAKE 1 TABLET BY MOUTH TWICE A DAY 180 tablet 3   mupirocin ointment (BACTROBAN) 2 % Appy to affected area 2 times daily for 5 days (Patient not taking: Reported on 02/08/2022) 15 g 0   Nicotine (NICOTROL NS) 10 MG/ML SOLN 1-2 SPRAYS IN NOSTRIL PER HOUR TRY TO LIMIT TO 10 SPRAYS DAILY 10 mL 4   nitroGLYCERIN (NITROSTAT) 0.4 MG SL tablet PLACE 1 TABLET UNDER THE TONGUE EVERY 5 MINUTES AS NEEDED FOR CHEST PAIN. MAX 3 DOSES, CALL 911. 25 tablet 10   ondansetron (ZOFRAN) 4 MG tablet 1 tab by mouth every 6 hours as needed for nausea and vomiting. 12 tablet 0   pantoprazole (PROTONIX) 40 MG tablet Take 40 mg by mouth daily.     prednisoLONE acetate (PRED FORTE) 1 % ophthalmic suspension Place 4-5 drops into the left eye daily.  6   rosuvastatin (CRESTOR) 40 MG tablet TAKE 1 TABLET BY MOUTH EVERY DAY 90 tablet 3   sertraline (ZOLOFT) 100 MG tablet 1 1/2 TABS BY MOUTH DAILY 135 tablet 3   Study - [name](A) - olpasiran (AMG 890) 142 mg/mL or placebo SQ injection (PI-[place]) Inject 142  mg into the skin once. For Investigational Use Only. Inject 1 mL (1 prefilled syringe) subcutaneously into appropriate injection site per protocol. (Approved injection site(s): upper arm, upper thigh & abdomen). Please contact [facility] with any questions or concerns regarding this medication.  traMADol (ULTRAM) 50 MG tablet TAKE 1 TABLET EVERY 6 HOURS AS NEEDED FOR MODERATE PAIN 90 tablet 2   traMADol (ULTRAM) 50 MG tablet Take by mouth. Duplicate (Patient not taking: Reported on 11/01/2021)      Allergies: Allergies  Allergen Reactions   Aspirin Other (See Comments)    Stomach bleeds     Social History: Social History   Tobacco Use   Smoking status: Every Day    Packs/day: 0.50    Years: 22.00    Total pack years: 11.00    Types: Cigarettes   Smokeless tobacco: Never   Tobacco comments:    Has been to classes--not sure if she wants to quit.  Changing to non menthol..  Chantix, patches, gum never helped.  9/19:  lot going on.  Vaping Use   Vaping Use: Never used  Substance Use Topics   Alcohol use: Yes    Alcohol/week: 0.0 standard drinks of alcohol    Comment: Extremely rare--once yearly or less.   Drug use: No    Types: "Crack" cocaine, Marijuana    Comment: 12/31/2012 " clean from crack.  Does still smoke marijuana     Family History Family History  Problem Relation Age of Onset   Breast cancer Mother 17   Hypertension Father    Stomach cancer Sister 4       cause of death   Hypertension Sister    Lung cancer Sister 24       Cause of death   Cancer Sister 50       ? stomach cancer   Colon cancer Neg Hx     Review of Systems She has left flank pain and fever. She has no voiding complaints. She denies CP or SOB.  10 systems were reviewed and are negative except as noted specifically in the HPI.    Objective   Vital signs in last 24 hours: BP (!) 113/55   Pulse (!) 111   Temp (!) 102.4 F (39.1 C) (Oral)   Resp (!) 22   Ht '5\' 1"'  (1.549 m)    Wt 83.9 kg   LMP 01/04/2015   SpO2 98%   BMI 34.96 kg/m   Physical Exam General: NAD, A&O, resting, appropriate HEENT: [place]/AT, EOMI, MMM Pulmonary: Normal work of breathing Cardiovascular: HDS, adequate peripheral perfusion Abdomen: Soft, NTTP, nondistended, LUQT.  Left CVA tenderness Extremities: warm and well perfused Neuro: Appropriate, no focal neurological deficits  Most Recent Labs: Lab Results  Component Value Date   WBC 25.6 (H) 02/19/2022   HGB 15.0 02/19/2022   HCT 46.5 (H) 02/19/2022   PLT 380 02/19/2022    Lab Results  Component Value Date   NA 141 02/19/2022   K 3.3 (L) 02/19/2022   CL 104 02/19/2022   CO2 26 02/19/2022   BUN 18 02/19/2022   CREATININE 1.54 (H) 02/19/2022   CALCIUM 9.6 02/19/2022   MG 2.7 (H) 06/16/2018   PHOS 1.9 (L) 02/16/2018    Lab Results  Component Value Date   INR 0.93 12/19/2012   APTT 27 11/09/2011     Urine Culture: '@[id number]' (laburin,org,[id number],[id number])@   IMAGING: DG Chest Port 1 View  Result Date: 02/19/2022 CLINICAL DATA:  Hypoxemia EXAM: PORTABLE CHEST 1 VIEW COMPARISON:  None Available. FINDINGS: Cardiomegaly. Mild, diffuse bilateral interstitial pulmonary opacity. The visualized skeletal structures are unremarkable. IMPRESSION: Cardiomegaly with mild, diffuse bilateral interstitial pulmonary opacity, likely edema. No focal airspace opacity. Electronically Signed   By: Jamse Mead.D.  On: 02/19/2022 18:31   CT Abdomen Pelvis W Contrast  Result Date: 02/19/2022 CLINICAL DATA:  Abdominal pain. EXAM: CT ABDOMEN AND PELVIS WITH CONTRAST TECHNIQUE: Multidetector CT imaging of the abdomen and pelvis was performed using the standard protocol following bolus administration of intravenous contrast. RADIATION DOSE REDUCTION: This exam was performed according to the departmental dose-optimization program which includes automated exposure control, adjustment of the mA and/or kV according to patient size and/or use of iterative  reconstruction technique. CONTRAST:  3m OMNIPAQUE IOHEXOL 300 MG/ML  SOLN COMPARISON:  09/02/2019 FINDINGS: Lower chest: Atelectasis noted within the left lower lobe. Hepatobiliary: No focal liver abnormality identified. Gallbladder appears normal. No bile duct dilatation. Pancreas: Unremarkable. No pancreatic ductal dilatation or surrounding inflammatory changes. Spleen: Normal in size without focal abnormality. Adrenals/Urinary Tract: Normal adrenal glands. Multiple right renal calcifications are identified within the upper pole which are likely vascular in etiology. Within the inferior pole of the right kidney there are 2 small calcifications which may be vascular or represent small kidney stones. These measure up to 3 mm. There is no right-sided hydronephrosis or mass. Multiple left renal calcifications are identified which represent a combination of vascular calcifications and kidney stones. One large stone versus conglomeration of stones noted in the inferior pole of the left kidney measuring 8 mm. There is asymmetric left-sided nephromegaly with perinephric fat stranding and hydronephrosis. Left-sided hydroureter is identified to the level of the left UVJ. At the left UVJ there is a 6 mm stone, image 72/2. Stomach/Bowel: Stomach is within normal limits. Appendix appears normal. No evidence of bowel wall thickening, distention, or inflammatory changes. Vascular/Lymphatic: Aortic atherosclerosis. Enlarged left periaortic lymph node measures 1.2 cm, image 33/2. This is compared with 0.7 cm previously. No pelvic or inguinal adenopathy. Reproductive: Multiple calcified uterine fibroids are identified. No adnexal mass. Other: There is a small volume of free fluid extending along the left pericolic gutter and left retroperitoneum. Musculoskeletal: No acute or significant osseous findings. IMPRESSION: 1. Left-sided hydronephrosis and hydroureter to the level of the left UVJ where there is a 6 mm stone. There is  asymmetric left-sided perinephric fat stranding and fluid as well as periureteral soft tissue stranding. Superimposed pyelonephritis cannot be excluded. 2. Multiple left renal calcifications which represent a combination of vascular calcifications and kidney stones. 3. Small volume of free fluid extending along the left pericolic gutter and left retroperitoneum. 4. Enlarged left periaortic lymph node is nonspecific, but favored to be reactive in etiology. 5. Calcified uterine fibroids. 6. Aortic Atherosclerosis (ICD10-I70.0). Electronically Signed   By: TKerby MoorsM.D.   On: 02/19/2022 17:14    ------  Assessment:  56y.o. female with left infected obstructing 655mdistal ureteral stone.   Recommendations: - Cystoscopy with left ureteral stent insertion will be done.   I have reviewed the risks of bleeding, infection, injury to the ureter, need for secondary procedures such as subsequent ureteroscopy or a perc if stenting is not successful, thrombotic events and anesthetic complications.     Thank you for this consult. Please contact the urology consult pager with any further questions/concerns.

## 2022-02-19 NOTE — Progress Notes (Signed)
A consult was received from an ED physician for vancomycin per pharmacy dosing (for an indication other than meningitis). The patient's profile has been reviewed for ht/wt/allergies/indication/available labs. A one time order has been placed for the above antibiotics.  Further antibiotics/pharmacy consults should be ordered by admitting physician if indicated.                       Bernadene Person, PharmD, BCPS [phone] 02/19/2022, 6:36 PM

## 2022-02-19 NOTE — ED Triage Notes (Signed)
Per EMS, patient from home, c/o LUQ pain since removing dexcom from left side yesterday. Tender on palpation and warm to the touch per EMS. Reports taking tramadol without relief.  BP 130/72 HR 100 RR 18 94% RA

## 2022-02-20 ENCOUNTER — Encounter (HOSPITAL_COMMUNITY): Payer: Self-pay | Admitting: Urology

## 2022-02-20 DIAGNOSIS — R6520 Severe sepsis without septic shock: Secondary | ICD-10-CM | POA: Diagnosis not present

## 2022-02-20 DIAGNOSIS — A419 Sepsis, unspecified organism: Secondary | ICD-10-CM | POA: Diagnosis not present

## 2022-02-20 LAB — HIV ANTIBODY (ROUTINE TESTING W REFLEX): HIV Screen 4th Generation wRfx: NONREACTIVE

## 2022-02-20 LAB — COMPREHENSIVE METABOLIC PANEL
ALT: 8 U/L (ref 0–44)
AST: 16 U/L (ref 15–41)
Albumin: 3.1 g/dL — ABNORMAL LOW (ref 3.5–5.0)
Alkaline Phosphatase: 97 U/L (ref 38–126)
Anion gap: 10 (ref 5–15)
BUN: 21 mg/dL — ABNORMAL HIGH (ref 6–20)
CO2: 21 mmol/L — ABNORMAL LOW (ref 22–32)
Calcium: 8.9 mg/dL (ref 8.9–10.3)
Chloride: 108 mmol/L (ref 98–111)
Creatinine, Ser: 1.54 mg/dL — ABNORMAL HIGH (ref 0.44–1.00)
GFR, Estimated: 40 mL/min — ABNORMAL LOW (ref >60)
Glucose, Bld: 362 mg/dL — ABNORMAL HIGH (ref 70–99)
Potassium: 3.7 mmol/L (ref 3.5–5.1)
Sodium: 139 mmol/L (ref 135–145)
Total Bilirubin: 1 mg/dL (ref 0.3–1.2)
Total Protein: 7.5 g/dL (ref 6.5–8.1)

## 2022-02-20 LAB — CBC
HCT: 41.2 % (ref 36.0–46.0)
Hemoglobin: 12.9 g/dL (ref 12.0–15.0)
MCH: 29.8 pg (ref 26.0–34.0)
MCHC: 31.3 g/dL (ref 30.0–36.0)
MCV: 95.2 fL (ref 80.0–100.0)
Platelets: 301 K/uL (ref 150–400)
RBC: 4.33 MIL/uL (ref 3.87–5.11)
RDW: 17.7 % — ABNORMAL HIGH (ref 11.5–15.5)
WBC: 26.8 K/uL — ABNORMAL HIGH (ref 4.0–10.5)
nRBC: 0 % (ref 0.0–0.2)

## 2022-02-20 LAB — PROCALCITONIN: Procalcitonin: 2.7 ng/mL

## 2022-02-20 LAB — GLUCOSE, CAPILLARY
Glucose-Capillary: 405 mg/dL — ABNORMAL HIGH (ref 70–99)
Glucose-Capillary: 449 mg/dL — ABNORMAL HIGH (ref 70–99)
Glucose-Capillary: 403 mg/dL — ABNORMAL HIGH (ref 70–99)
Glucose-Capillary: 316 mg/dL — ABNORMAL HIGH (ref 70–99)
Glucose-Capillary: 262 mg/dL — ABNORMAL HIGH (ref 70–99)
Glucose-Capillary: 188 mg/dL — ABNORMAL HIGH (ref 70–99)
Glucose-Capillary: 231 mg/dL — ABNORMAL HIGH (ref 70–99)

## 2022-02-20 LAB — MAGNESIUM: Magnesium: 2.3 mg/dL (ref 1.7–2.4)

## 2022-02-20 LAB — HEMOGLOBIN A1C
Hgb A1c MFr Bld: 7.2 % — ABNORMAL HIGH (ref 4.8–5.6)
Mean Plasma Glucose: 159.94 mg/dL

## 2022-02-20 MED ORDER — INSULIN ASPART 100 UNIT/ML IJ SOLN
0.0000 [IU] | Freq: Three times a day (TID) | INTRAMUSCULAR | Status: DC
  Administered 2022-02-20: 15 [IU] via SUBCUTANEOUS
  Administered 2022-02-20: 11 [IU] via SUBCUTANEOUS
  Administered 2022-02-20: 3 [IU] via SUBCUTANEOUS
  Administered 2022-02-21: 8 [IU] via SUBCUTANEOUS
  Administered 2022-02-21: 15 [IU] via SUBCUTANEOUS
  Administered 2022-02-21: 3 [IU] via SUBCUTANEOUS
  Administered 2022-02-22: 8 [IU] via SUBCUTANEOUS
  Administered 2022-02-22 (×2): 3 [IU] via SUBCUTANEOUS
  Administered 2022-02-23 (×2): 5 [IU] via SUBCUTANEOUS

## 2022-02-20 MED ORDER — SERTRALINE HCL 50 MG PO TABS
150.0000 mg | ORAL_TABLET | Freq: Every day | ORAL | Status: DC
  Administered 2022-02-20 – 2022-02-23 (×4): 150 mg via ORAL
  Filled 2022-02-20 (×4): qty 1

## 2022-02-20 MED ORDER — FOLIC ACID 1 MG PO TABS
1000.0000 ug | ORAL_TABLET | Freq: Every day | ORAL | Status: DC
  Administered 2022-02-20 – 2022-02-23 (×4): 1 mg via ORAL
  Filled 2022-02-20 (×4): qty 1

## 2022-02-20 MED ORDER — FAMOTIDINE 20 MG PO TABS
20.0000 mg | ORAL_TABLET | Freq: Two times a day (BID) | ORAL | Status: DC
  Administered 2022-02-20 – 2022-02-23 (×7): 20 mg via ORAL
  Filled 2022-02-20 (×7): qty 1

## 2022-02-20 MED ORDER — INSULIN ASPART 100 UNIT/ML IJ SOLN: 0.0000 [IU] | Freq: Three times a day (TID) | INTRAMUSCULAR | Status: DC

## 2022-02-20 MED ORDER — CHLORHEXIDINE GLUCONATE CLOTH 2 % EX PADS
6.0000 | MEDICATED_PAD | Freq: Every day | CUTANEOUS | Status: DC
Start: 2022-02-20 — End: 2022-02-23
  Administered 2022-02-20 – 2022-02-23 (×4): 6 via TOPICAL

## 2022-02-20 MED ORDER — FUROSEMIDE 10 MG/ML IJ SOLN
20.0000 mg | Freq: Once | INTRAMUSCULAR | Status: AC
  Administered 2022-02-20: 20 mg via INTRAVENOUS
  Filled 2022-02-20: qty 2

## 2022-02-20 MED ORDER — ENOXAPARIN SODIUM 40 MG/0.4ML IJ SOSY
40.0000 mg | PREFILLED_SYRINGE | INTRAMUSCULAR | Status: DC
  Administered 2022-02-20 – 2022-02-22 (×3): 40 mg via SUBCUTANEOUS
  Filled 2022-02-20 (×3): qty 0.4

## 2022-02-20 MED ORDER — PANTOPRAZOLE SODIUM 40 MG PO TBEC
40.0000 mg | DELAYED_RELEASE_TABLET | Freq: Every day | ORAL | Status: DC
  Administered 2022-02-20 – 2022-02-23 (×4): 40 mg via ORAL
  Filled 2022-02-20 (×4): qty 1

## 2022-02-20 MED ORDER — METOPROLOL TARTRATE 25 MG PO TABS
25.0000 mg | ORAL_TABLET | Freq: Two times a day (BID) | ORAL | Status: DC
  Administered 2022-02-20 – 2022-02-23 (×7): 25 mg via ORAL
  Filled 2022-02-20 (×7): qty 1

## 2022-02-20 MED ORDER — CLOPIDOGREL BISULFATE 75 MG PO TABS
75.0000 mg | ORAL_TABLET | Freq: Every day | ORAL | Status: DC
  Administered 2022-02-20 – 2022-02-23 (×4): 75 mg via ORAL
  Filled 2022-02-20 (×4): qty 1

## 2022-02-20 MED ORDER — ROSUVASTATIN CALCIUM 20 MG PO TABS
40.0000 mg | ORAL_TABLET | Freq: Every day | ORAL | Status: DC
  Administered 2022-02-20 – 2022-02-23 (×4): 40 mg via ORAL
  Filled 2022-02-20 (×4): qty 2

## 2022-02-20 MED ORDER — INSULIN ASPART 100 UNIT/ML IV SOLN
10.0000 [IU] | Freq: Once | INTRAVENOUS | Status: DC
Start: 2022-02-20 — End: 2022-02-21

## 2022-02-20 MED ORDER — FUROSEMIDE 40 MG PO TABS
40.0000 mg | ORAL_TABLET | Freq: Every day | ORAL | Status: DC
  Administered 2022-02-20 – 2022-02-23 (×4): 40 mg via ORAL
  Filled 2022-02-20 (×4): qty 1

## 2022-02-20 MED ORDER — INSULIN GLARGINE-YFGN 100 UNIT/ML ~~LOC~~ SOLN
10.0000 [IU] | Freq: Two times a day (BID) | SUBCUTANEOUS | Status: DC
  Administered 2022-02-20 (×2): 10 [IU] via SUBCUTANEOUS
  Filled 2022-02-20 (×4): qty 0.1

## 2022-02-20 MED ORDER — INSULIN ASPART 100 UNIT/ML IJ SOLN
0.0000 [IU] | Freq: Every day | INTRAMUSCULAR | Status: DC
  Administered 2022-02-20 – 2022-02-21 (×2): 2 [IU] via SUBCUTANEOUS
  Administered 2022-02-22: 3 [IU] via SUBCUTANEOUS

## 2022-02-20 MED ORDER — TRAMADOL HCL 50 MG PO TABS
50.0000 mg | ORAL_TABLET | Freq: Four times a day (QID) | ORAL | Status: DC | PRN
  Administered 2022-02-21 – 2022-02-23 (×3): 50 mg via ORAL
  Filled 2022-02-20 (×3): qty 1

## 2022-02-20 NOTE — Hospital Course (Signed)
[name] [name] is a 56 y.o. female with medical history significant for multivessel CAD (s/p DES to mid and distal RCA 2012, DES to mid LAD 2022), HFpEF (EF 55-60% by TTE 08/12/2020), CKD stage IIIa, rheumatoid arthritis on methotrexate, T2DM, HTN, HLD, depression/anxiety, tobacco use who is admitted with severe sepsis due to infected obstructing left ureteral stone.

## 2022-02-20 NOTE — Assessment & Plan Note (Signed)
Creatinine 1.54 on admission compared to baseline of 1.1.  AKI in setting of ureteral obstruction and severe sepsis.  Anticipate improvement with management as above.  Repeat labs in AM.

## 2022-02-20 NOTE — Anesthesia Postprocedure Evaluation (Signed)
Anesthesia Post Note  Patient: [name] [name]  Procedure(s) Performed: CYSTOSCOPY WITH URETERAL STENT PLACEMENT (Left)     Patient location during evaluation: PACU Anesthesia Type: General Level of consciousness: awake and alert Pain management: pain level controlled Vital Signs Assessment: post-procedure vital signs reviewed and stable Respiratory status: spontaneous breathing, nonlabored ventilation, respiratory function stable and patient connected to nasal cannula oxygen Cardiovascular status: blood pressure returned to baseline and stable Postop Assessment: no apparent nausea or vomiting Anesthetic complications: no   No notable events documented.  Last Vitals:  Vitals:   02/19/22 2256 02/19/22 2316  BP: (!) 115/55 (!) 116/55  Pulse: 83 80  Resp: 15 18  Temp: (!) 36.3 C 37.3 C  SpO2: 95% 96%    Last Pain:  Vitals:   02/19/22 2316  TempSrc: Axillary  PainSc: 0-No pain                 Earl Lites P Qiara Minetti

## 2022-02-20 NOTE — Assessment & Plan Note (Signed)
Continue sertraline 

## 2022-02-20 NOTE — Assessment & Plan Note (Signed)
Urology consulted and taken for planned left ureteral stenting tonight.  If unsuccessful plan is to pursue percutaneous nephrostomy tube. -Continue empiric IV ceftriaxone -Follow-up blood and urine cultures

## 2022-02-20 NOTE — Progress Notes (Signed)
  Transition of Care [facility]) Screening Note   Patient Details  Name: [name] [name] Date of Birth: 05-04-1966   Transition of Care [facility]) CM/SW Contact:    Lanier Clam, RN Phone Number: 02/20/2022, 3:08 PM    Transition of Care Department [facility]) has reviewed patient and no TOC needs have been identified at this time. We will continue to monitor patient advancement through interdisciplinary progression rounds. If new patient transition needs arise, please place a TOC consult.

## 2022-02-20 NOTE — Assessment & Plan Note (Signed)
Hold home methotrexate. ?

## 2022-02-20 NOTE — Progress Notes (Signed)
1 Day Post-Op  Subjective: [name] [name] is improved following stenting for a left UVJ stone with sepsis.  She is reporting some stent pain but is afebrile this morning.   ROS:  ROS  Anti-infectives: Anti-infectives (From admission, onward)    Start     Dose/Rate Route Frequency Ordered Stop   02/19/22 2000  cefTRIAXone (ROCEPHIN) 2 g in sodium chloride 0.9 % 100 mL IVPB        2 g 200 mL/hr over 30 Minutes Intravenous Every 24 hours 02/19/22 1951 02/26/22 1959   02/19/22 1845  vancomycin (VANCOCIN) IVPB 1000 mg/200 mL premix        1,000 mg 200 mL/hr over 60 Minutes Intravenous  Once 02/19/22 1836 02/19/22 1952   02/19/22 1800  piperacillin-tazobactam (ZOSYN) IVPB 3.375 g  Status:  Discontinued        3.375 g 12.5 mL/hr over 240 Minutes Intravenous Every 8 hours 02/19/22 1722 02/19/22 1951       Current Facility-Administered Medications  Medication Dose Route Frequency Provider Last Rate Last Admin   0.9 %  sodium chloride infusion   Intravenous Continuous Charlsie Quest, MD 10 mL/hr at 02/19/22 2218 New Bag at 02/19/22 2218   acetaminophen (TYLENOL) tablet 650 mg  650 mg Oral Q6H PRN Charlsie Quest, MD       Or   acetaminophen (TYLENOL) suppository 650 mg  650 mg Rectal Q6H PRN Charlsie Quest, MD       cefTRIAXone (ROCEPHIN) 2 g in sodium chloride 0.9 % 100 mL IVPB  2 g Intravenous Q24H Charlsie Quest, MD   Stopped at 02/19/22 2244   clopidogrel (PLAVIX) tablet 75 mg  75 mg Oral Daily Patel, Vishal R, MD       enoxaparin (LOVENOX) injection 40 mg  40 mg Subcutaneous Q24H Patel, Floreen Comber, MD       furosemide (LASIX) tablet 40 mg  40 mg Oral Daily Darreld Mclean R, MD       HYDROmorphone (DILAUDID) injection 1 mg  1 mg Intravenous Q3H PRN Charlsie Quest, MD   1 mg at 02/20/22 0803   insulin aspart (novoLOG) 100 UNIT/ML injection            insulin aspart (novoLOG) 100 UNIT/ML injection            insulin aspart (novoLOG) injection 0-9 Units  0-9 Units Subcutaneous TID WC Patel, Vishal  R, MD       ondansetron (ZOFRAN) tablet 4 mg  4 mg Oral Q6H PRN Charlsie Quest, MD       Or   ondansetron (ZOFRAN) injection 4 mg  4 mg Intravenous Q6H PRN Charlsie Quest, MD   4 mg at 02/20/22 0804   pantoprazole (PROTONIX) EC tablet 40 mg  40 mg Oral Daily Charlsie Quest, MD       rosuvastatin (CRESTOR) tablet 40 mg  40 mg Oral Daily Patel, Floreen Comber, MD       senna-docusate (Senokot-S) tablet 1 tablet  1 tablet Oral QHS PRN Charlsie Quest, MD       sertraline (ZOLOFT) tablet 150 mg  150 mg Oral Daily Patel, Vishal R, MD       sodium chloride flush (NS) 0.9 % injection 3 mL  3 mL Intravenous Q12H Patel, Vishal R, MD         Objective: Vital signs in last 24 hours: Temp:  [97.2 F (36.2 C)-103.1 F (39.5 C)] 98.7 F (37.1 C) (07/24  0757) Pulse Rate:  [77-118] 114 (07/24 0757) Resp:  [15-36] 20 (07/24 0757) BP: (78-149)/(45-84) 144/68 (07/24 0757) SpO2:  [79 %-98 %] 95 % (07/24 0757) Weight:  [83.9 kg-86.7 kg] 86.7 kg (07/24 0336)  Intake/Output from previous day: 07/23 0701 - 07/24 0700 In: 1100 [I.V.:500; IV Piggyback:100] Out: 975 [Urine:975] Intake/Output this shift: No intake/output data recorded.   Physical Exam Vitals reviewed.  Constitutional:      Appearance: She is well-developed.  Neurological:     Mental Status: She is alert.     Lab Results:  Recent Labs    02/19/22 1228 02/20/22 0553  WBC 25.6* 26.8*  HGB 15.0 12.9  HCT 46.5* 41.2  PLT 380 301   BMET Recent Labs    02/19/22 1228 02/20/22 0553  NA 141 139  K 3.3* 3.7  CL 104 108  CO2 26 21*  GLUCOSE 106* 362*  BUN 18 21*  CREATININE 1.54* 1.54*  CALCIUM 9.6 8.9   PT/INR No results for input(s): "LABPROT", "INR" in the last 72 hours. ABG No results for input(s): "PHART", "HCO3" in the last 72 hours.  Invalid input(s): "PCO2", "PO2"  Studies/Results: DG C-Arm 1-60 Min-No Report  Result Date: 02/19/2022 Fluoroscopy was utilized by the requesting physician.  No radiographic  interpretation.   DG Chest Port 1 View  Result Date: 02/19/2022 CLINICAL DATA:  Hypoxemia EXAM: PORTABLE CHEST 1 VIEW COMPARISON:  None Available. FINDINGS: Cardiomegaly. Mild, diffuse bilateral interstitial pulmonary opacity. The visualized skeletal structures are unremarkable. IMPRESSION: Cardiomegaly with mild, diffuse bilateral interstitial pulmonary opacity, likely edema. No focal airspace opacity. Electronically Signed   By: Jearld Lesch M.D.   On: 02/19/2022 18:31   CT Abdomen Pelvis W Contrast  Result Date: 02/19/2022 CLINICAL DATA:  Abdominal pain. EXAM: CT ABDOMEN AND PELVIS WITH CONTRAST TECHNIQUE: Multidetector CT imaging of the abdomen and pelvis was performed using the standard protocol following bolus administration of intravenous contrast. RADIATION DOSE REDUCTION: This exam was performed according to the departmental dose-optimization program which includes automated exposure control, adjustment of the mA and/or kV according to patient size and/or use of iterative reconstruction technique. CONTRAST:  79mL OMNIPAQUE IOHEXOL 300 MG/ML  SOLN COMPARISON:  09/02/2019 FINDINGS: Lower chest: Atelectasis noted within the left lower lobe. Hepatobiliary: No focal liver abnormality identified. Gallbladder appears normal. No bile duct dilatation. Pancreas: Unremarkable. No pancreatic ductal dilatation or surrounding inflammatory changes. Spleen: Normal in size without focal abnormality. Adrenals/Urinary Tract: Normal adrenal glands. Multiple right renal calcifications are identified within the upper pole which are likely vascular in etiology. Within the inferior pole of the right kidney there are 2 small calcifications which may be vascular or represent small kidney stones. These measure up to 3 mm. There is no right-sided hydronephrosis or mass. Multiple left renal calcifications are identified which represent a combination of vascular calcifications and kidney stones. One large stone versus  conglomeration of stones noted in the inferior pole of the left kidney measuring 8 mm. There is asymmetric left-sided nephromegaly with perinephric fat stranding and hydronephrosis. Left-sided hydroureter is identified to the level of the left UVJ. At the left UVJ there is a 6 mm stone, image 72/2. Stomach/Bowel: Stomach is within normal limits. Appendix appears normal. No evidence of bowel wall thickening, distention, or inflammatory changes. Vascular/Lymphatic: Aortic atherosclerosis. Enlarged left periaortic lymph node measures 1.2 cm, image 33/2. This is compared with 0.7 cm previously. No pelvic or inguinal adenopathy. Reproductive: Multiple calcified uterine fibroids are identified. No adnexal mass. Other: There is  a small volume of free fluid extending along the left pericolic gutter and left retroperitoneum. Musculoskeletal: No acute or significant osseous findings. IMPRESSION: 1. Left-sided hydronephrosis and hydroureter to the level of the left UVJ where there is a 6 mm stone. There is asymmetric left-sided perinephric fat stranding and fluid as well as periureteral soft tissue stranding. Superimposed pyelonephritis cannot be excluded. 2. Multiple left renal calcifications which represent a combination of vascular calcifications and kidney stones. 3. Small volume of free fluid extending along the left pericolic gutter and left retroperitoneum. 4. Enlarged left periaortic lymph node is nonspecific, but favored to be reactive in etiology. 5. Calcified uterine fibroids. 6. Aortic Atherosclerosis (ICD10-I70.0). Electronically Signed   By: Signa Kell M.D.   On: 02/19/2022 17:14     Assessment and Plan: LUVJ stone with sepsis.  Improving s/p stent insertion.   I will get her set up for left ureteroscopy with stone extraction in 1-2 weeks.       LOS: 1 day    Bjorn Pippin 02/20/2022 [phone] Patient ID: [name] [name], female   DOB: June 19, 1966, 56 y.o.   MRN: [id number]

## 2022-02-20 NOTE — Progress Notes (Addendum)
   02/20/22 1159  Therapy Vitals  Pulse Rate (!) 125  Oxygen Therapy  SpO2 (!) 83 %  O2 Device Room Air  Pulse Oximetry Type Continuous  Mobility  Activity Ambulated with assistance in hallway  Range of Motion/Exercises Active  Level of Assistance Minimal assist, patient does 75% or more  Assistive Device Front wheel walker  Distance Ambulated (ft) 100 ft  Activity Response Tolerated well  Transport method Ambulatory  $Mobility charge 1 Mobility   Pt agreed to ambulate in hallway after 2 days of not getting up. Pt O2 was checked on room air, was 100% before ambulating. Oxygen level fluctuated throughout ambulation. Pt mentioned being tired and "legs giving up" on return trip. Audible shortness of breath throughout session. Encouraged to practice pursed lip breathing. Pt was left in recliner w/ necessities in reach. Oxygen was reconnected.  Notified RN of session.    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

## 2022-02-20 NOTE — Assessment & Plan Note (Signed)
EF 55-60% by TTE 08/12/2020.  Mild pulmonary edema noted on CXR after initial sepsis fluid resuscitation. -Plan to resume home Lasix 40 mg daily tomorrow morning as long as BP allows -Monitor strict I/O's and daily weights

## 2022-02-20 NOTE — Assessment & Plan Note (Signed)
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  Stable, denies any recent chest pain. -Resume home Plavix and rosuvastatin tomorrow

## 2022-02-20 NOTE — Assessment & Plan Note (Signed)
Place on SSI. °

## 2022-02-20 NOTE — Assessment & Plan Note (Signed)
Resume home Lopressor once BP stabilizes.

## 2022-02-20 NOTE — Progress Notes (Signed)
Patient CBG 405 this AM, rechecked was 449. MD Pahwani made aware. Changed SSI scale and told RN to give 15 units. Insulin given, will continue to monitor closely.

## 2022-02-20 NOTE — Op Note (Addendum)
PATIENT:  [name] [name]  Preoperative diagnosis:  Left infected ureteral stone   Postoperative diagnosis:  same   Procedure:  Cystoscopy left ureteral stent placement (50fr 22cm JJ)   Surgeon: Dr. Bjorn Pippin, M.D.  Anesthesia: General  Complications: None  EBL: Minimal  Specimens: None  Indication: [name] is a 56 y.o. female with a left infected obstructing left kidney stone. After reviewing the management options for treatment, they have elected to proceed with the above surgical procedure(s). We have discussed the potential benefits and risks of the procedure, side effects of the proposed treatment, the likelihood of the patient achieving the goals of the procedure, and any potential problems that might occur during the procedure or recuperation. Informed consent has been obtained.  Findings:   Description of procedure:   The patient was taken to the operating room and general anesthesia was induced.  The patient was placed in the dorsal lithotomy position, prepped and draped in the usual sterile fashion, and preoperative antibiotics were administered. A preoperative time-out was performed.   Cystourethroscopy was performed.  The patient's urethra was examined and was normal. The bladder was then systematically examined in its entirety. There was no evidence for any bladder tumors, stones, or other mucosal pathology.    Attention then turned to the left ureteral orifice and a ureteral catheter was used to intubate the ureteral orifice. There was residual contrast from her CT scan clearly outlining the collecting system so no retrograde was performed.  A Sensor guidewire was then advanced up the left ureter into the renal pelvis under fluoroscopic guidance.  The wire was then backloaded through the cystoscope and a ureteral stent was advance over the wire using Seldinger technique.  The stent was positioned appropriately under fluoroscopic and cystoscopic guidance.  The wire was then  removed with an adequate stent curl noted in the renal pelvis as well as in the bladder.  The bladder was then emptied and the procedure ended.  The patient appeared to tolerate the procedure well and without complications.  The patient was able to be awakened and transferred to the recovery unit in satisfactory condition.

## 2022-02-20 NOTE — Progress Notes (Signed)
PROGRESS NOTE    [name] [name]  WUJ:[id number] DOB: 02-14-1966 DOA: 02/19/2022 PCP: Mack Hook, MD   Brief Narrative:  [name] [name] is a 56 y.o. female with medical history significant for multivessel CAD (s/p DES to mid and distal RCA 2012, DES to mid LAD 2022), HFpEF (EF 55-60% by TTE 08/12/2020), CKD stage IIIa, rheumatoid arthritis on methotrexate, T2DM, HTN, HLD, depression/anxiety, tobacco use who presented to the ED for evaluation of left flank pain and fevers.  Patient reports new onset left flank pain for 1 day.  She has had associated dysuria with decreased urine output.  She has had nausea, vomiting, fevers, chills, diaphoresis.  She denies any diarrhea, chest pain, dyspnea.   ED Course  Labs/Imaging on admission: I have personally reviewed following labs and imaging studies.   Initial vitals showed BP 114/56, pulse 88, RR 18, temp 103.1 F, SPO2 90% on room air.  While in the ED patient was tachycardic with pulse up to 120, tachypneic with RR up to 31.   Labs show WBC 25.6, hemoglobin 15.0, platelets 380,000, sodium 141, potassium 3.3, bicarb 26, BUN 18, creatinine 1.54 (baseline 1.1), serum glucose 106, AST 15, ALT 8, alk phos 118, total bilirubin 1.4, lipase 27, lactic acid 2.1 > 1.8.   Urinalysis shows negative nitrates, large leukocytes, 21-50 RBC/hpf, >50 WBC/hpf, many bacteria microscopy.  Urine and blood cultures in process.   CT abdomen/pelvis with contrast shows left-sided hydronephrosis and hydroureter to the level of the left UVJ where there is a 6 mm stone.  Multiple left renal calcifications, small volume free fluid along the left paracolic gutter and left retroperitoneum, enlarged left periaortic lymph node, and calcified uterine fibroids noted.   Portable chest x-ray shows cardiomegaly with mild diffuse bilateral interstitial pulmonary edema.   Patient was given 2 L normal saline, IV vancomycin and Zosyn, Tylenol, ibuprofen, Percocet.   Urology, Dr. Hinton Rao,  was contacted and will see in consultation and recommended medical admission.  The hospitalist service was consulted to admit for further evaluation and management.  Assessment & Plan:   Principal Problem:   Severe sepsis ([place]) Active Problems:   Left hydronephrosis and hydroureter with urinary obstruction due to infected ureteral calculus   Acute renal failure superimposed on stage 3a chronic kidney disease (HCC)   Chronic heart failure with preserved ejection fraction (HFpEF) (HCC)   CAD S/P percutaneous coronary angioplasty   Insulin dependent type 2 diabetes mellitus ([place])   Hypertension associated with diabetes ([place])   Rheumatoid arthritis ([place])   Hyperlipidemia associated with type 2 diabetes mellitus ([place])   Depression with anxiety  Severe sepsis secondary to infected left ureteral calculus/left hydronephrosis and hydroureter with urinary obstruction, POA: Presenting with fever, tachycardia, tachypnea, leukocytosis, lactic acidosis 2.1, and AKI.  Severe sepsis secondary to UTI/pyelonephritis due to obstructing left ureteral calculus.  Underwent cystoscopy and left ureteral stent placement by Dr. Alfonse Spruce on 02/19/2022.  Urology to set her up for left ureteroscopy with the stent extraction in 1 to 2 weeks.  Cultures negative.  Continue Rocephin in the meantime and follow clinical course.  Still febrile with 101.5 fever this morning and still has left flank pain with only minimal improvement.   Acute on chronic heart failure with preserved ejection fraction (HFpEF) ([place]) EF 55-60% by TTE 08/12/2020.  Mild pulmonary edema noted on CXR after initial sepsis fluid resuscitation.  Continue home dose of Lasix today, monitor closely as her creatinine is elevated.   Acute renal failure superimposed on stage  3a chronic kidney disease (HCC) Creatinine 1.54 on admission compared to baseline of 1.1.  AKI in setting of ureteral obstruction and severe sepsis.  Anticipate improvement with management as  above.  Repeat labs in AM.   CAD S/P percutaneous coronary angioplasty S/p prior DES to RCA and mid LAD.  Stable, denies any recent chest pain. -Resume home Plavix and rosuvastatin today.   Insulin dependent type 2 diabetes mellitus ([place]): Significantly hyperglycemic with blood sugar over 400.  Will resume Lantus 10 units twice daily that she takes at home and change SSI from sensitive to moderate.   Hypertension associated with diabetes [facility]): Blood pressure controlled, will resume home Lopressor.   Depression with anxiety Continue sertraline.   Hyperlipidemia associated with type 2 diabetes mellitus (HCC) Continue rosuvastatin.   Rheumatoid arthritis ([place]) Hold home methotrexate.  DVT prophylaxis: enoxaparin (LOVENOX) injection 40 mg Start: 02/20/22 2200 SCDs Start: 02/19/22 1950   Code Status: Full Code  Family Communication:  None present at bedside.  Plan of care discussed with patient in length and he/she verbalized understanding and agreed with it.  Status is: Inpatient Remains inpatient appropriate because: Still very symptomatic and septic.   Estimated body mass index is 36.11 kg/m as calculated from the following:   Height as of this encounter: '5\' 1"'  (1.549 m).   Weight as of this encounter: 86.7 kg.    Nutritional Assessment: Body mass index is 36.11 kg/m.Marland Kitchen Seen by dietician.  I agree with the assessment and plan as outlined below: Nutrition Status:        . Skin Assessment: I have examined the patient's skin and I agree with the wound assessment as performed by the wound care RN as outlined below:    Consultants:  Urology  Procedures:  As above  Antimicrobials:  Anti-infectives (From admission, onward)    Start     Dose/Rate Route Frequency Ordered Stop   02/19/22 2000  cefTRIAXone (ROCEPHIN) 2 g in sodium chloride 0.9 % 100 mL IVPB        2 g 200 mL/hr over 30 Minutes Intravenous Every 24 hours 02/19/22 1951 02/26/22 1959   02/19/22 1845   vancomycin (VANCOCIN) IVPB 1000 mg/200 mL premix        1,000 mg 200 mL/hr over 60 Minutes Intravenous  Once 02/19/22 1836 02/19/22 1952   02/19/22 1800  piperacillin-tazobactam (ZOSYN) IVPB 3.375 g  Status:  Discontinued        3.375 g 12.5 mL/hr over 240 Minutes Intravenous Every 8 hours 02/19/22 1722 02/19/22 1951         Subjective: Seen and examined.  Complains of severe pain in the left flank pain.  However, when I entered the room, she was talking to someone on the phone and appeared to be comfortable but as soon as she hung up, she started crying due to pain.  Initially she said that her pain is  "much better" than yesterday but then when she was asked to grade her pain and assuming that she had 10 out of 10 pain yesterday, she said today it is 9.5 out of 10.  Objective: Vitals:   02/19/22 2316 02/20/22 0335 02/20/22 0336 02/20/22 0757  BP: (!) 116/55 109/63  (!) 144/68  Pulse: 80 92  (!) 114  Resp: '18 20  20  ' Temp: 99.1 F (37.3 C) (!) 97.2 F (36.2 C)  98.7 F (37.1 C)  TempSrc: Axillary Oral  Oral  SpO2: 96% 94%  95%  Weight:   86.7  kg   Height:        Intake/Output Summary (Last 24 hours) at 02/20/2022 0824 Last data filed at 02/20/2022 0336 Gross per 24 hour  Intake 1100 ml  Output 975 ml  Net 125 ml   Filed Weights   02/19/22 1232 02/19/22 2156 02/20/22 0336  Weight: 83.9 kg 83.9 kg 86.7 kg    Examination:  General exam: Appears calm and comfortable  Respiratory system: Clear to auscultation. Respiratory effort normal. Cardiovascular system: S1 & S2 heard, RRR. No JVD, murmurs, rubs, gallops or clicks. No pedal edema. Gastrointestinal system: Abdomen is nondistended, soft and nontender. No organomegaly or masses felt. Normal bowel sounds heard.  Left flank/left upper abdominal tenderness and left CVA tenderness Central nervous system: Alert and oriented. No focal neurological deficits. Extremities: Symmetric 5 x 5 power. Skin: No rashes, lesions or  ulcers Psychiatry: Judgement and insight appear normal. Mood & affect appropriate.    Data Reviewed: I have personally reviewed following labs and imaging studies  CBC: Recent Labs  Lab 02/19/22 1228 02/20/22 0553  WBC 25.6* 26.8*  NEUTROABS 21.8*  --   HGB 15.0 12.9  HCT 46.5* 41.2  MCV 92.1 95.2  PLT 380 197   Basic Metabolic Panel: Recent Labs  Lab 02/19/22 1228 02/20/22 0553  NA 141 139  K 3.3* 3.7  CL 104 108  CO2 26 21*  GLUCOSE 106* 362*  BUN 18 21*  CREATININE 1.54* 1.54*  CALCIUM 9.6 8.9  MG  --  2.3   GFR: Estimated Creatinine Clearance: 41.3 mL/min (A) (by C-G formula based on SCr of 1.54 mg/dL (H)). Liver Function Tests: Recent Labs  Lab 02/19/22 1228 02/20/22 0553  AST 15 16  ALT 8 8  ALKPHOS 118 97  BILITOT 1.4* 1.0  PROT 8.7* 7.5  ALBUMIN 3.8 3.1*   Recent Labs  Lab 02/19/22 1228  LIPASE 27   No results for input(s): "AMMONIA" in the last 168 hours. Coagulation Profile: No results for input(s): "INR", "PROTIME" in the last 168 hours. Cardiac Enzymes: No results for input(s): "CKTOTAL", "CKMB", "CKMBINDEX", "TROPONINI" in the last 168 hours. BNP (last 3 results) No results for input(s): "PROBNP" in the last 8760 hours. HbA1C: No results for input(s): "HGBA1C" in the last 72 hours. CBG: Recent Labs  Lab 02/19/22 2018 02/19/22 2116 02/19/22 2253 02/20/22 0738 02/20/22 0758  GLUCAP 246* 213* 119* 405* 449*   Lipid Profile: No results for input(s): "CHOL", "HDL", "LDLCALC", "TRIG", "CHOLHDL", "LDLDIRECT" in the last 72 hours. Thyroid Function Tests: No results for input(s): "TSH", "T4TOTAL", "FREET4", "T3FREE", "THYROIDAB" in the last 72 hours. Anemia Panel: No results for input(s): "VITAMINB12", "FOLATE", "FERRITIN", "TIBC", "IRON", "RETICCTPCT" in the last 72 hours. Sepsis Labs: Recent Labs  Lab 02/19/22 1516 02/19/22 1711 02/20/22 0553  PROCALCITON  --   --  2.70  LATICACIDVEN 2.1* 1.8  --     Recent Results (from the  past 240 hour(s))  Culture, blood (routine x 2)     Status: None (Preliminary result)   Collection Time: 02/19/22  3:16 PM   Specimen: BLOOD  Result Value Ref Range Status   Specimen Description   Final    BLOOD RIGHT ANTECUBITAL Performed at [facility] [address], [place], [place] [address]    Special Requests   Final    BOTTLES DRAWN AEROBIC AND ANAEROBIC Blood Culture adequate volume Performed at [facility], [place] [address] [place], [place] [address]    Culture PENDING  Incomplete   Report Status PENDING  Incomplete  Culture, blood (routine x 2)     Status: None (Preliminary result)   Collection Time: 02/19/22  3:21 PM   Specimen: BLOOD  Result Value Ref Range Status   Specimen Description BLOOD RIGHT ANTECUBITAL  Final   Special Requests   Final    BOTTLES DRAWN AEROBIC ONLY Blood Culture adequate volume Performed at [facility], [address] [place], [place] [address]    Culture PENDING  Incomplete   Report Status PENDING  Incomplete     Radiology Studies: DG C-Arm 1-60 Min-No Report  Result Date: 02/19/2022 Fluoroscopy was utilized by the requesting physician.  No radiographic interpretation.   DG Chest Port 1 View  Result Date: 02/19/2022 CLINICAL DATA:  Hypoxemia EXAM: PORTABLE CHEST 1 VIEW COMPARISON:  None Available. FINDINGS: Cardiomegaly. Mild, diffuse bilateral interstitial pulmonary opacity. The visualized skeletal structures are unremarkable. IMPRESSION: Cardiomegaly with mild, diffuse bilateral interstitial pulmonary opacity, likely edema. No focal airspace opacity. Electronically Signed   By: Delanna Ahmadi M.D.   On: 02/19/2022 18:31   CT Abdomen Pelvis W Contrast  Result Date: 02/19/2022 CLINICAL DATA:  Abdominal pain. EXAM: CT ABDOMEN AND PELVIS WITH CONTRAST TECHNIQUE: Multidetector CT imaging of the abdomen and pelvis was performed using the standard protocol following bolus administration of intravenous contrast.  RADIATION DOSE REDUCTION: This exam was performed according to the departmental dose-optimization program which includes automated exposure control, adjustment of the mA and/or kV according to patient size and/or use of iterative reconstruction technique. CONTRAST:  70m OMNIPAQUE IOHEXOL 300 MG/ML  SOLN COMPARISON:  09/02/2019 FINDINGS: Lower chest: Atelectasis noted within the left lower lobe. Hepatobiliary: No focal liver abnormality identified. Gallbladder appears normal. No bile duct dilatation. Pancreas: Unremarkable. No pancreatic ductal dilatation or surrounding inflammatory changes. Spleen: Normal in size without focal abnormality. Adrenals/Urinary Tract: Normal adrenal glands. Multiple right renal calcifications are identified within the upper pole which are likely vascular in etiology. Within the inferior pole of the right kidney there are 2 small calcifications which may be vascular or represent small kidney stones. These measure up to 3 mm. There is no right-sided hydronephrosis or mass. Multiple left renal calcifications are identified which represent a combination of vascular calcifications and kidney stones. One large stone versus conglomeration of stones noted in the inferior pole of the left kidney measuring 8 mm. There is asymmetric left-sided nephromegaly with perinephric fat stranding and hydronephrosis. Left-sided hydroureter is identified to the level of the left UVJ. At the left UVJ there is a 6 mm stone, image 72/2. Stomach/Bowel: Stomach is within normal limits. Appendix appears normal. No evidence of bowel wall thickening, distention, or inflammatory changes. Vascular/Lymphatic: Aortic atherosclerosis. Enlarged left periaortic lymph node measures 1.2 cm, image 33/2. This is compared with 0.7 cm previously. No pelvic or inguinal adenopathy. Reproductive: Multiple calcified uterine fibroids are identified. No adnexal mass. Other: There is a small volume of free fluid extending along the left  pericolic gutter and left retroperitoneum. Musculoskeletal: No acute or significant osseous findings. IMPRESSION: 1. Left-sided hydronephrosis and hydroureter to the level of the left UVJ where there is a 6 mm stone. There is asymmetric left-sided perinephric fat stranding and fluid as well as periureteral soft tissue stranding. Superimposed pyelonephritis cannot be excluded. 2. Multiple left renal calcifications which represent a combination of vascular calcifications and kidney stones. 3. Small volume of free fluid extending along the left pericolic gutter and left retroperitoneum. 4. Enlarged left periaortic lymph node  is nonspecific, but favored to be reactive in etiology. 5. Calcified uterine fibroids. 6. Aortic Atherosclerosis (ICD10-I70.0). Electronically Signed   By: Kerby Moors M.D.   On: 02/19/2022 17:14    Scheduled Meds:  clopidogrel  75 mg Oral Daily   enoxaparin (LOVENOX) injection  40 mg Subcutaneous Q24H   furosemide  40 mg Oral Daily   insulin aspart       insulin aspart       insulin aspart  0-15 Units Subcutaneous TID WC   insulin aspart  0-5 Units Subcutaneous QHS   pantoprazole  40 mg Oral Daily   rosuvastatin  40 mg Oral Daily   sertraline  150 mg Oral Daily   sodium chloride flush  3 mL Intravenous Q12H   Continuous Infusions:  sodium chloride 10 mL/hr at 02/19/22 2218   cefTRIAXone (ROCEPHIN)  IV Stopped (02/19/22 2244)     LOS: 1 day   Darliss Cheney, MD [facility]  02/20/2022, 8:24 AM   *Please note that this is a verbal dictation therefore any spelling or grammatical errors are due to the "[place] [employer]" system interpretation.  Please page via [place] and do not message via secure chat for urgent patient care matters. Secure chat can be used for non urgent patient care matters.  How to contact the [facility] Attending or Consulting provider [place] or covering provider during after hours [place], for this patient?  Check the care team in [facility] and look for  a) attending/consulting [facility] provider listed and b) the [facility] team listed. Page or secure chat [phone]. Log into [email or web address] and use [place]'s universal password to access. If you do not have the password, please contact the hospital operator. Locate the [facility] provider you are looking for under [facility] and page to a number that you can be directly reached. If you still have difficulty reaching the provider, please page the [facility] (Director on Call) for the Hospitalists listed on amion for assistance.

## 2022-02-20 NOTE — Assessment & Plan Note (Signed)
Continue rosuvastatin.  

## 2022-02-21 ENCOUNTER — Other Ambulatory Visit: Payer: Self-pay | Admitting: Urology

## 2022-02-21 ENCOUNTER — Telehealth: Payer: Self-pay | Admitting: Interventional Cardiology

## 2022-02-21 DIAGNOSIS — R6520 Severe sepsis without septic shock: Secondary | ICD-10-CM | POA: Diagnosis not present

## 2022-02-21 DIAGNOSIS — A419 Sepsis, unspecified organism: Secondary | ICD-10-CM | POA: Diagnosis not present

## 2022-02-21 LAB — CBC WITH DIFFERENTIAL/PLATELET
Abs Immature Granulocytes: 0.16 10*3/uL — ABNORMAL HIGH (ref 0.00–0.07)
Basophils Absolute: 0.1 10*3/uL (ref 0.0–0.1)
Basophils Relative: 0 %
Eosinophils Absolute: 0 10*3/uL (ref 0.0–0.5)
Eosinophils Relative: 0 %
HCT: 36.9 % (ref 36.0–46.0)
Hemoglobin: 11.6 g/dL — ABNORMAL LOW (ref 12.0–15.0)
Immature Granulocytes: 1 %
Lymphocytes Relative: 5 %
Lymphs Abs: 0.9 10*3/uL (ref 0.7–4.0)
MCH: 30.1 pg (ref 26.0–34.0)
MCHC: 31.4 g/dL (ref 30.0–36.0)
MCV: 95.6 fL (ref 80.0–100.0)
Monocytes Absolute: 1.7 10*3/uL — ABNORMAL HIGH (ref 0.1–1.0)
Monocytes Relative: 9 %
Neutro Abs: 15.9 10*3/uL — ABNORMAL HIGH (ref 1.7–7.7)
Neutrophils Relative %: 85 %
Platelets: 248 10*3/uL (ref 150–400)
RBC: 3.86 MIL/uL — ABNORMAL LOW (ref 3.87–5.11)
RDW: 17.8 % — ABNORMAL HIGH (ref 11.5–15.5)
WBC: 18.7 10*3/uL — ABNORMAL HIGH (ref 4.0–10.5)
nRBC: 0 % (ref 0.0–0.2)

## 2022-02-21 LAB — BASIC METABOLIC PANEL
Anion gap: 10 (ref 5–15)
BUN: 23 mg/dL — ABNORMAL HIGH (ref 6–20)
CO2: 21 mmol/L — ABNORMAL LOW (ref 22–32)
Calcium: 8.4 mg/dL — ABNORMAL LOW (ref 8.9–10.3)
Chloride: 108 mmol/L (ref 98–111)
Creatinine, Ser: 1.46 mg/dL — ABNORMAL HIGH (ref 0.44–1.00)
GFR, Estimated: 42 mL/min — ABNORMAL LOW (ref >60)
Glucose, Bld: 321 mg/dL — ABNORMAL HIGH (ref 70–99)
Potassium: 3.7 mmol/L (ref 3.5–5.1)
Sodium: 139 mmol/L (ref 135–145)

## 2022-02-21 LAB — GLUCOSE, CAPILLARY
Glucose-Capillary: 373 mg/dL — ABNORMAL HIGH (ref 70–99)
Glucose-Capillary: 280 mg/dL — ABNORMAL HIGH (ref 70–99)
Glucose-Capillary: 236 mg/dL — ABNORMAL HIGH (ref 70–99)
Glucose-Capillary: 185 mg/dL — ABNORMAL HIGH (ref 70–99)
Glucose-Capillary: 213 mg/dL — ABNORMAL HIGH (ref 70–99)

## 2022-02-21 LAB — URINE CULTURE: Culture: >=100000 — AB

## 2022-02-21 LAB — MAGNESIUM: Magnesium: 2 mg/dL (ref 1.7–2.4)

## 2022-02-21 MED ORDER — INSULIN GLARGINE-YFGN 100 UNIT/ML ~~LOC~~ SOLN
15.0000 [IU] | Freq: Two times a day (BID) | SUBCUTANEOUS | Status: DC
  Administered 2022-02-21 – 2022-02-23 (×5): 15 [IU] via SUBCUTANEOUS
  Filled 2022-02-21 (×6): qty 0.15

## 2022-02-21 NOTE — Progress Notes (Signed)
PROGRESS NOTE    [name] [name]  IPJ:[id number] DOB: [birth year] DOA: 02/19/2022 PCP: Mack Hook, MD   Brief Narrative:  [name] [name] is a 56 y.o. female with medical history significant for multivessel CAD (s/p DES to mid and distal RCA 2012, DES to mid LAD 2022), HFpEF (EF 55-60% by TTE 08/12/2020), CKD stage IIIa, rheumatoid arthritis on methotrexate, T2DM, HTN, HLD, depression/anxiety, tobacco use who presented to the ED for evaluation of left flank pain and fevers.  Patient reports new onset left flank pain for 1 day.  She has had associated dysuria with decreased urine output.  She has had nausea, vomiting, fevers, chills, diaphoresis.  She denies any diarrhea, chest pain, dyspnea.   ED Course  Labs/Imaging on admission: I have personally reviewed following labs and imaging studies.   Initial vitals showed BP 114/56, pulse 88, RR 18, temp 103.1 F, SPO2 90% on room air.  While in the ED patient was tachycardic with pulse up to 120, tachypneic with RR up to 31.   Labs show WBC 25.6, hemoglobin 15.0, platelets 380,000, sodium 141, potassium 3.3, bicarb 26, BUN 18, creatinine 1.54 (baseline 1.1), serum glucose 106, AST 15, ALT 8, alk phos 118, total bilirubin 1.4, lipase 27, lactic acid 2.1 > 1.8.   Urinalysis shows negative nitrates, large leukocytes, 21-50 RBC/hpf, >50 WBC/hpf, many bacteria microscopy.  Urine and blood cultures in process.   CT abdomen/pelvis with contrast shows left-sided hydronephrosis and hydroureter to the level of the left UVJ where there is a 6 mm stone.  Multiple left renal calcifications, small volume free fluid along the left paracolic gutter and left retroperitoneum, enlarged left periaortic lymph node, and calcified uterine fibroids noted.   Portable chest x-ray shows cardiomegaly with mild diffuse bilateral interstitial pulmonary edema.   Patient was given 2 L normal saline, IV vancomycin and Zosyn, Tylenol, ibuprofen, Percocet.   Urology, Dr. Hinton Rao,  was contacted and will see in consultation and recommended medical admission.  The hospitalist service was consulted to admit for further evaluation and management.  Assessment & Plan:   Principal Problem:   Severe sepsis ([place]) Active Problems:   Left hydronephrosis and hydroureter with urinary obstruction due to infected ureteral calculus   Acute renal failure superimposed on stage 3a chronic kidney disease (HCC)   Chronic heart failure with preserved ejection fraction (HFpEF) (HCC)   CAD S/P percutaneous coronary angioplasty   Insulin dependent type 2 diabetes mellitus ([place])   Hypertension associated with diabetes ([place])   Rheumatoid arthritis ([place])   Hyperlipidemia associated with type 2 diabetes mellitus ([place])   Depression with anxiety  Severe sepsis secondary to infected left ureteral calculus/left hydronephrosis and hydroureter with urinary obstruction, POA: Presenting with fever, tachycardia, tachypnea, leukocytosis, lactic acidosis 2.1, and AKI.  Severe sepsis secondary to UTI/pyelonephritis due to obstructing left ureteral calculus.  Underwent cystoscopy and left ureteral stent placement by Dr. Alfonse Spruce on 02/19/2022.  Urology to set her up for left ureteroscopy with the stent extraction in 1 to 2 weeks.  Urine culture growing Proteus Mirabella's, blood culture negative.  Patient is still febrile and septic but leukocytosis improving.  Her flank pain is improving.  If remains febrile by tomorrow, may consider repeating CT abdomen to rule out abscess formation.   Acute on chronic heart failure with preserved ejection fraction (HFpEF) ([place]) EF 55-60% by TTE 08/12/2020.  Mild pulmonary edema noted on CXR after initial sepsis fluid resuscitation.  Resume home dose of Lasix 40 mg p.o. daily, in addition,  give her an extra dose of IV Lasix 20 mg yesterday.  Improving.  Saturating 100% on 3 L, will wean down.  Nursing advised.  Acute renal failure superimposed on stage 3a chronic kidney disease  (HCC) Creatinine 1.54 on admission compared to baseline of 1.1.  AKI in setting of ureteral obstruction and severe sepsis.  Slowly improving, 1.46 today.  Due to CHF, unable to give her IV fluids.   CAD S/P percutaneous coronary angioplasty S/p prior DES to RCA and mid LAD.  Stable, denies any recent chest pain. -Resume home Plavix and rosuvastatin today.   Insulin dependent type 2 diabetes mellitus ([place]): Blood sugar still elevated despite of resuming her home dose of Lantus 10 units twice daily, will increase that to 15 units twice daily and continue SSI.   Hypertension associated with diabetes [facility]): Blood pressure controlled, continue home Lopressor.   Depression with anxiety Continue sertraline.   Hyperlipidemia associated with type 2 diabetes mellitus (HCC) Continue rosuvastatin.   Rheumatoid arthritis ([place]) Hold home methotrexate.  DVT prophylaxis: enoxaparin (LOVENOX) injection 40 mg Start: 02/20/22 2200 SCDs Start: 02/19/22 1950   Code Status: Full Code  Family Communication:  None present at bedside.  Plan of care discussed with patient in length and he/she verbalized understanding and agreed with it.  Status is: Inpatient Remains inpatient appropriate because: Still very symptomatic and septic.   Estimated body mass index is 35.94 kg/m as calculated from the following:   Height as of this encounter: '5\' 1"'  (1.549 m).   Weight as of this encounter: 86.3 kg.    Nutritional Assessment: Body mass index is 35.94 kg/m.Marland Kitchen Seen by dietician.  I agree with the assessment and plan as outlined below: Nutrition Status:        . Skin Assessment: I have examined the patient's skin and I agree with the wound assessment as performed by the wound care RN as outlined below:    Consultants:  Urology  Procedures:  As above  Antimicrobials:  Anti-infectives (From admission, onward)    Start     Dose/Rate Route Frequency Ordered Stop   02/19/22 2000  cefTRIAXone  (ROCEPHIN) 2 g in sodium chloride 0.9 % 100 mL IVPB        2 g 200 mL/hr over 30 Minutes Intravenous Every 24 hours 02/19/22 1951 02/26/22 1959   02/19/22 1845  vancomycin (VANCOCIN) IVPB 1000 mg/200 mL premix        1,000 mg 200 mL/hr over 60 Minutes Intravenous  Once 02/19/22 1836 02/19/22 1952   02/19/22 1800  piperacillin-tazobactam (ZOSYN) IVPB 3.375 g  Status:  Discontinued        3.375 g 12.5 mL/hr over 240 Minutes Intravenous Every 8 hours 02/19/22 1722 02/19/22 1951         Subjective:  Seen and examined.  She tells me that she feels much better.  She still has left flank pain but only 3-4 out of 10 as opposed to 9 out of 10 yesterday.  No other complaint.  She is asking when she can go home.  Objective: Vitals:   02/21/22 0023 02/21/22 0225 02/21/22 0252 02/21/22 0404  BP: (!) 117/59 (!) 112/46    Pulse: 91 88    Resp: (!) 22 20    Temp: 99.1 F (37.3 C) (!) 101.1 F (38.4 C) (!) 101.1 F (38.4 C)   TempSrc: Oral Oral    SpO2: 91% 100%    Weight:    86.3 kg  Height:  Intake/Output Summary (Last 24 hours) at 02/21/2022 0807 Last data filed at 02/21/2022 0615 Gross per 24 hour  Intake 1722.83 ml  Output 4376 ml  Net -2653.17 ml    Filed Weights   02/19/22 2156 02/20/22 0336 02/21/22 0404  Weight: 83.9 kg 86.7 kg 86.3 kg    Examination:  General exam: Appears calm and comfortable  Respiratory system: Faint crackles at the bases bilaterally. Respiratory effort normal. Cardiovascular system: S1 & S2 heard, RRR. No JVD, murmurs, rubs, gallops or clicks.  Trace pitting edema bilateral lower extremity Gastrointestinal system: Abdomen is nondistended, soft and left upper quadrant tenderness with left CVA tenderness. No organomegaly or masses felt. Normal bowel sounds heard. Central nervous system: Alert and oriented. No focal neurological deficits. Extremities: Symmetric 5 x 5 power. Skin: No rashes, lesions or ulcers.  Psychiatry: Judgement and insight  appear normal. Mood & affect appropriate.   Data Reviewed: I have personally reviewed following labs and imaging studies  CBC: Recent Labs  Lab 02/19/22 1228 02/20/22 0553 02/21/22 0441  WBC 25.6* 26.8* 18.7*  NEUTROABS 21.8*  --  15.9*  HGB 15.0 12.9 11.6*  HCT 46.5* 41.2 36.9  MCV 92.1 95.2 95.6  PLT 380 301 374    Basic Metabolic Panel: Recent Labs  Lab 02/19/22 1228 02/20/22 0553 02/21/22 0441  NA 141 139 139  K 3.3* 3.7 3.7  CL 104 108 108  CO2 26 21* 21*  GLUCOSE 106* 362* 321*  BUN 18 21* 23*  CREATININE 1.54* 1.54* 1.46*  CALCIUM 9.6 8.9 8.4*  MG  --  2.3 2.0    GFR: Estimated Creatinine Clearance: 43.4 mL/min (A) (by C-G formula based on SCr of 1.46 mg/dL (H)). Liver Function Tests: Recent Labs  Lab 02/19/22 1228 02/20/22 0553  AST 15 16  ALT 8 8  ALKPHOS 118 97  BILITOT 1.4* 1.0  PROT 8.7* 7.5  ALBUMIN 3.8 3.1*    Recent Labs  Lab 02/19/22 1228  LIPASE 27    No results for input(s): "AMMONIA" in the last 168 hours. Coagulation Profile: No results for input(s): "INR", "PROTIME" in the last 168 hours. Cardiac Enzymes: No results for input(s): "CKTOTAL", "CKMB", "CKMBINDEX", "TROPONINI" in the last 168 hours. BNP (last 3 results) No results for input(s): "PROBNP" in the last 8760 hours. HbA1C: Recent Labs    02/20/22 0553  HGBA1C 7.2*   CBG: Recent Labs  Lab 02/20/22 1054 02/20/22 1251 02/20/22 1618 02/20/22 2107 02/21/22 0735  GLUCAP 316* 262* 188* 231* 373*    Lipid Profile: No results for input(s): "CHOL", "HDL", "LDLCALC", "TRIG", "CHOLHDL", "LDLDIRECT" in the last 72 hours. Thyroid Function Tests: No results for input(s): "TSH", "T4TOTAL", "FREET4", "T3FREE", "THYROIDAB" in the last 72 hours. Anemia Panel: No results for input(s): "VITAMINB12", "FOLATE", "FERRITIN", "TIBC", "IRON", "RETICCTPCT" in the last 72 hours. Sepsis Labs: Recent Labs  Lab 02/19/22 1516 02/19/22 1711 02/20/22 0553  PROCALCITON  --   --  2.70   LATICACIDVEN 2.1* 1.8  --      Recent Results (from the past 240 hour(s))  Culture, blood (routine x 2)     Status: None (Preliminary result)   Collection Time: 02/19/22  3:16 PM   Specimen: BLOOD  Result Value Ref Range Status   Specimen Description   Final    BLOOD RIGHT ANTECUBITAL Performed at [facility] [address], [place], [place] [address]    Special Requests   Final    BOTTLES DRAWN AEROBIC AND ANAEROBIC Blood Culture  adequate volume   Culture   Final    NO GROWTH 2 DAYS Performed at [facility], [place] [address] [place], [place] [address]    Report Status PENDING  Incomplete  Culture, blood (routine x 2)     Status: None (Preliminary result)   Collection Time: 02/19/22  3:21 PM   Specimen: BLOOD  Result Value Ref Range Status   Specimen Description BLOOD RIGHT ANTECUBITAL  Final   Special Requests   Final    BOTTLES DRAWN AEROBIC ONLY Blood Culture adequate volume   Culture   Final    NO GROWTH 2 DAYS Performed at [facility], [place] [address] [place], [place] [address]    Report Status PENDING  Incomplete  Urine Culture     Status: Abnormal (Preliminary result)   Collection Time: 02/19/22  5:47 PM   Specimen: Urine, Clean Catch  Result Value Ref Range Status   Specimen Description   Final    URINE, CLEAN CATCH Performed at [facility], [place] [address] [place], [place] [address]    Special Requests   Final    NONE Performed at [facility], [place] [address] [place], [place] [address]    Culture (A)  Final    >=100,000 COLONIES/mL PROTEUS MIRABILIS SUSCEPTIBILITIES TO FOLLOW Performed at [facility], [place] [address], [place],  [address]    Report Status PENDING  Incomplete     Radiology Studies: DG C-Arm 1-60 Min-No Report  Result Date: 02/19/2022 Fluoroscopy was utilized by the requesting physician.  No radiographic interpretation.   DG Chest Port 1  View  Result Date: 02/19/2022 CLINICAL DATA:  Hypoxemia EXAM: PORTABLE CHEST 1 VIEW COMPARISON:  None Available. FINDINGS: Cardiomegaly. Mild, diffuse bilateral interstitial pulmonary opacity. The visualized skeletal structures are unremarkable. IMPRESSION: Cardiomegaly with mild, diffuse bilateral interstitial pulmonary opacity, likely edema. No focal airspace opacity. Electronically Signed   By: Delanna Ahmadi M.D.   On: 02/19/2022 18:31   CT Abdomen Pelvis W Contrast  Result Date: 02/19/2022 CLINICAL DATA:  Abdominal pain. EXAM: CT ABDOMEN AND PELVIS WITH CONTRAST TECHNIQUE: Multidetector CT imaging of the abdomen and pelvis was performed using the standard protocol following bolus administration of intravenous contrast. RADIATION DOSE REDUCTION: This exam was performed according to the departmental dose-optimization program which includes automated exposure control, adjustment of the mA and/or kV according to patient size and/or use of iterative reconstruction technique. CONTRAST:  68m OMNIPAQUE IOHEXOL 300 MG/ML  SOLN COMPARISON:  09/02/2019 FINDINGS: Lower chest: Atelectasis noted within the left lower lobe. Hepatobiliary: No focal liver abnormality identified. Gallbladder appears normal. No bile duct dilatation. Pancreas: Unremarkable. No pancreatic ductal dilatation or surrounding inflammatory changes. Spleen: Normal in size without focal abnormality. Adrenals/Urinary Tract: Normal adrenal glands. Multiple right renal calcifications are identified within the upper pole which are likely vascular in etiology. Within the inferior pole of the right kidney there are 2 small calcifications which may be vascular or represent small kidney stones. These measure up to 3 mm. There is no right-sided hydronephrosis or mass. Multiple left renal calcifications are identified which represent a combination of vascular calcifications and kidney stones. One large stone versus conglomeration of stones noted in the  inferior pole of the left kidney measuring 8 mm. There is asymmetric left-sided nephromegaly with perinephric fat stranding and hydronephrosis. Left-sided hydroureter is identified to the level of the left UVJ. At the left UVJ there is a 6 mm stone, image 72/2. Stomach/Bowel:  Stomach is within normal limits. Appendix appears normal. No evidence of bowel wall thickening, distention, or inflammatory changes. Vascular/Lymphatic: Aortic atherosclerosis. Enlarged left periaortic lymph node measures 1.2 cm, image 33/2. This is compared with 0.7 cm previously. No pelvic or inguinal adenopathy. Reproductive: Multiple calcified uterine fibroids are identified. No adnexal mass. Other: There is a small volume of free fluid extending along the left pericolic gutter and left retroperitoneum. Musculoskeletal: No acute or significant osseous findings. IMPRESSION: 1. Left-sided hydronephrosis and hydroureter to the level of the left UVJ where there is a 6 mm stone. There is asymmetric left-sided perinephric fat stranding and fluid as well as periureteral soft tissue stranding. Superimposed pyelonephritis cannot be excluded. 2. Multiple left renal calcifications which represent a combination of vascular calcifications and kidney stones. 3. Small volume of free fluid extending along the left pericolic gutter and left retroperitoneum. 4. Enlarged left periaortic lymph node is nonspecific, but favored to be reactive in etiology. 5. Calcified uterine fibroids. 6. Aortic Atherosclerosis (ICD10-I70.0). Electronically Signed   By: Kerby Moors M.D.   On: 02/19/2022 17:14    Scheduled Meds:  Chlorhexidine Gluconate Cloth  6 each Topical Daily   clopidogrel  75 mg Oral Daily   enoxaparin (LOVENOX) injection  40 mg Subcutaneous Q24H   famotidine  20 mg Oral BID   folic acid  6,060 mcg Oral Daily   furosemide  40 mg Oral Daily   insulin aspart  0-15 Units Subcutaneous TID WC   insulin aspart  0-5 Units Subcutaneous QHS   insulin  glargine-yfgn  15 Units Subcutaneous BID   metoprolol tartrate  25 mg Oral BID   pantoprazole  40 mg Oral Daily   rosuvastatin  40 mg Oral Daily   sertraline  150 mg Oral Daily   sodium chloride flush  3 mL Intravenous Q12H   Continuous Infusions:  sodium chloride 10 mL/hr at 02/21/22 0435   cefTRIAXone (ROCEPHIN)  IV Stopped (02/20/22 2059)     LOS: 2 days   Darliss Cheney, MD [facility]  02/21/2022, 8:07 AM   *Please note that this is a verbal dictation therefore any spelling or grammatical errors are due to the "[employer]" system interpretation.  Please page via [place] and do not message via secure chat for urgent patient care matters. Secure chat can be used for non urgent patient care matters.  How to contact the [facility] Attending or Consulting provider [place] or covering provider during after hours [place], for this patient?  Check the care team in [facility] and look for a) attending/consulting [facility] provider listed and b) the [facility] team listed. Page or secure chat [phone]. Log into [email or web address] and use [place] universal password to access. If you do not have the password, please contact the hospital operator. Locate the [facility] provider you are looking for under [facility] and page to a number that you can be directly reached. If you still have difficulty reaching the provider, please page the [facility] (Director on Call) for the Hospitalists listed on amion for assistance.

## 2022-02-21 NOTE — Progress Notes (Signed)
Attempted to wean patient to room air per MD order.  Patient maintained SpO2 98% for several minutes on RA.  Found 30 min later by PT to be 82% on RA.  Placed back on 2 L.  Bradd Burner, RN

## 2022-02-21 NOTE — Telephone Encounter (Signed)
   Name: [name] [name]  DOB: 05-19-1966  MRN: [id number]  Primary Cardiologist: Lance Muss, MD   Preoperative team, please contact this patient and set up a phone call appointment for further preoperative risk assessment. Please obtain consent and complete medication review. Thank you for your help.  I confirm that guidance regarding antiplatelet and oral anticoagulation therapy has been completed and, if necessary, noted below.  If patient remains asymptomatic, per office protocol, she may hold her Plavix for 5 days prior to procedure with recommendations to resume Plavix as soon as possible postprocedure, at the discretion of the surgeon.  Joylene Grapes, NP 02/21/2022, 11:48 AM [facility] [address] [place], [place] [address]

## 2022-02-21 NOTE — Inpatient Diabetes Management (Signed)
Inpatient Diabetes Program Recommendations  AACE/ADA: New Consensus Statement on Inpatient Glycemic Control (2015)  Target Ranges:  Prepandial:   less than 140 mg/dL      Peak postprandial:   less than 180 mg/dL (1-2 hours)      Critically ill patients:  140 - 180 mg/dL   Lab Results  Component Value Date   GLUCAP 236 (H) 02/21/2022   HGBA1C 7.2 (H) 02/20/2022    Review of Glycemic Control  Diabetes history: DM2 Outpatient Diabetes medications: Lantus 10 BID, Novolog 4-16 units TID Current orders for Inpatient glycemic control: Semglee 15 BID, Novolog 0-15 TID with meals and 0-5 HS  Has Dexcom/Libre CGM  Inpatient Diabetes Program Recommendations:    Add meal coverage insulin - Novolog 5 units TID  Spoke with pt at bedside regarding her diabetes. Pt states she would like to lose about 20-30 pounds. Endo is Dr Sharl Ma. We discussed decreasing portion sizes and increasing exercise. Pt walks dogs in am and pm. Recommended increasing vegetables and trying to cut back on starchy foods. Pt states she's lost weight before and knows she can do it again. Gave encouragement and stressed importance of f/u with Endo.   Thank you. Ailene Ards, RD, LDN, CDE Inpatient Diabetes Coordinator [phone]

## 2022-02-21 NOTE — Telephone Encounter (Signed)
   Pre-operative Risk Assessment    Patient Name: [name] [name]  DOB: [birth year] MRN: [id number]      Request for Surgical Clearance    Procedure:   Left Ureteroscopy   Date of Surgery:  Clearance 03/14/22                                 Surgeon:  Dr. Bjorn Pippin Surgeon's Group or Practice Name:  [facility] Phone number:  [phone] Fax number:  [phone]   Type of Clearance Requested:   - Pharmacy:  Hold Clopidogrel (Plavix) 5 day hold   Type of Anesthesia:  General    Additional requests/questions:    Signed, April Henson   02/21/2022, 11:29 AM

## 2022-02-21 NOTE — Progress Notes (Signed)
Received a message from the nurse earlier that patient had red MEWS due to fever, tachycardia.  She received Tylenol, however fever broke.  I saw patient at the bedside.  Patient was sweating since her fever just broke.  She had no shortness of breath or any worsening pain.  Her pain was still 3-4 out of 10 as it was earlier.  She was fully alert and oriented.  In fact her lungs were clear this time.  We will continue with current management.  If she continues to have persistent fever until tomorrow morning, repeat CT abdomen could be considered to look for abscess.

## 2022-02-21 NOTE — Progress Notes (Signed)
   02/21/22 1211  Assess: MEWS Score  Temp (!) 100.9 F (38.3 C)  BP 130/69  MAP (mmHg) 87  Pulse Rate (!) 116  Resp (!) 31  Level of Consciousness Alert  SpO2 90 %  O2 Device Nasal Cannula  O2 Flow Rate (L/min) 2 L/min  Assess: MEWS Score  MEWS Temp 1  MEWS Systolic 0  MEWS Pulse 2  MEWS RR 2  MEWS LOC 0  MEWS Score 5  MEWS Score Color Red  Assess: if the MEWS score is Yellow or Red  Were vital signs taken at a resting state? Yes  Focused Assessment Change from prior assessment (see assessment flowsheet)  Does the patient meet 2 or more of the SIRS criteria? Yes  Does the patient have a confirmed or suspected source of infection? Yes  Provider and Rapid Response Notified? No  MEWS guidelines implemented *See Row Information* No, previously red, continue vital signs every 4 hours  Treat  MEWS Interventions Administered prn meds/treatments  Pain Scale 0-10  Pain Score 0  Notify: Charge Nurse/RN  Name of Charge Nurse/RN Notified Delford Field, RN  Date Charge Nurse/RN Notified 02/21/22  Time Charge Nurse/RN Notified 1218  Notify: Provider  Provider Name/Title Hughie Closs, MD  Date Provider Notified 02/21/22  Time Provider Notified 1223  Method of Notification  (secure chat)  Notification Reason Change in status  Assess: SIRS CRITERIA  SIRS Temperature  0  SIRS Pulse 1  SIRS Respirations  1  SIRS WBC 1  SIRS Score Sum  3   Bradd Burner, RN

## 2022-02-21 NOTE — Plan of Care (Signed)

## 2022-02-21 NOTE — Progress Notes (Addendum)
   02/21/22 1000  Oxygen Therapy  O2 Device Nasal Cannula  Mobility  Activity Ambulated with assistance in hallway  Level of Assistance Contact guard assist, steadying assist  Assistive Device Front wheel walker  Distance Ambulated (ft) 100 ft  Activity Response Tolerated well  Transport method Ambulatory  $Mobility charge 1 Mobility    Nurse requested O2 readings prior,during and post ambulation. Took O2 and BP will Pt was sat EOB and O2 was 82% on RA with BP at 109/55. Asked for nurse to apply O2 and got approval for it. Pt was then  put on 3L of O2 .Prior to ambulation with O2, Pt was at 99% on 3L. During ambulation Pt was at 93% on 3L and audibly SOB. Once Pt was sat at EOB she had 97% on 3L. Per Nurse request Pt was then moved down to 2L of O2. Pt had all necessities within reach.   Marilynne Halsted  Mobility Specialist

## 2022-02-22 ENCOUNTER — Inpatient Hospital Stay (HOSPITAL_COMMUNITY): Payer: Medicaid Other

## 2022-02-22 DIAGNOSIS — N179 Acute kidney failure, unspecified: Secondary | ICD-10-CM

## 2022-02-22 DIAGNOSIS — R6520 Severe sepsis without septic shock: Secondary | ICD-10-CM | POA: Diagnosis not present

## 2022-02-22 DIAGNOSIS — A419 Sepsis, unspecified organism: Secondary | ICD-10-CM | POA: Diagnosis not present

## 2022-02-22 DIAGNOSIS — N132 Hydronephrosis with renal and ureteral calculous obstruction: Secondary | ICD-10-CM | POA: Diagnosis not present

## 2022-02-22 DIAGNOSIS — N1831 Chronic kidney disease, stage 3a: Secondary | ICD-10-CM

## 2022-02-22 LAB — GLUCOSE, CAPILLARY
Glucose-Capillary: 184 mg/dL — ABNORMAL HIGH (ref 70–99)
Glucose-Capillary: 193 mg/dL — ABNORMAL HIGH (ref 70–99)
Glucose-Capillary: 264 mg/dL — ABNORMAL HIGH (ref 70–99)
Glucose-Capillary: 273 mg/dL — ABNORMAL HIGH (ref 70–99)

## 2022-02-22 LAB — CBC
HCT: 34.8 % — ABNORMAL LOW (ref 36.0–46.0)
Hemoglobin: 11.6 g/dL — ABNORMAL LOW (ref 12.0–15.0)
MCH: 30.2 pg (ref 26.0–34.0)
MCHC: 33.3 g/dL (ref 30.0–36.0)
MCV: 90.6 fL (ref 80.0–100.0)
Platelets: 263 10*3/uL (ref 150–400)
RBC: 3.84 MIL/uL — ABNORMAL LOW (ref 3.87–5.11)
RDW: 17.4 % — ABNORMAL HIGH (ref 11.5–15.5)
WBC: 14.1 10*3/uL — ABNORMAL HIGH (ref 4.0–10.5)
nRBC: 0 % (ref 0.0–0.2)

## 2022-02-22 LAB — BASIC METABOLIC PANEL
Anion gap: 11 (ref 5–15)
BUN: 19 mg/dL (ref 6–20)
CO2: 23 mmol/L (ref 22–32)
Calcium: 8.4 mg/dL — ABNORMAL LOW (ref 8.9–10.3)
Chloride: 105 mmol/L (ref 98–111)
Creatinine, Ser: 1.42 mg/dL — ABNORMAL HIGH (ref 0.44–1.00)
GFR, Estimated: 44 mL/min — ABNORMAL LOW (ref >60)
Glucose, Bld: 205 mg/dL — ABNORMAL HIGH (ref 70–99)
Potassium: 3.7 mmol/L (ref 3.5–5.1)
Sodium: 139 mmol/L (ref 135–145)

## 2022-02-22 MED ORDER — POLYETHYLENE GLYCOL 3350 17 G PO PACK
17.0000 g | PACK | Freq: Every day | ORAL | Status: DC
  Administered 2022-02-22 – 2022-02-23 (×2): 17 g via ORAL
  Filled 2022-02-22 (×2): qty 1

## 2022-02-22 NOTE — Progress Notes (Addendum)
PROGRESS NOTE    [name] [name]  WVP:[id number] DOB: 05-05-1966 DOA: 02/19/2022 PCP: Mack Hook, MD   Brief Narrative:  [name] [name] is a 56 y.o. female with medical history significant for multivessel CAD (s/p DES to mid and distal RCA 2012, DES to mid LAD 2022), HFpEF (EF 55-60% by TTE 08/12/2020), CKD stage IIIa, rheumatoid arthritis on methotrexate, T2DM, HTN, HLD, depression/anxiety, tobacco use who presented to the ED for evaluation of new onset left flank pain and fevers, dysuria with decreased urine output, nausea, vomiting, chills and diaphoresis.  She met sepsis criteria on admission including fever of 103.1 F, tachycardic up to 120s, tachypneic up to 31, leukocytosis of 25.6 and suspected UTI.  She also had AKI and lactic acidosis on admission. CT abdomen/pelvis with contrast shows left-sided hydronephrosis and hydroureter to the level of the left UVJ where there is a 6 mm stone.   She was treated with IV fluids, started empirically on IV vancomycin and Zosyn and urology was consulted.  She was finally determined to have severe sepsis secondary to infected and obstructing left ureteral calculus with UTI/acute pyelonephritis with hydronephrosis and hydroureter.   Assessment & Plan:   Principal Problem:   Severe sepsis ([place]) Active Problems:   Left hydronephrosis and hydroureter with urinary obstruction due to infected ureteral calculus   Acute renal failure superimposed on stage 3a chronic kidney disease (HCC)   Chronic heart failure with preserved ejection fraction (HFpEF) (HCC)   CAD S/P percutaneous coronary angioplasty   Insulin dependent type 2 diabetes mellitus ([place])   Hypertension associated with diabetes ([place])   Rheumatoid arthritis ([place])   Hyperlipidemia associated with type 2 diabetes mellitus ([place])   Depression with anxiety  Severe sepsis secondary to infected and obstructing left ureteral calculus/left hydronephrosis and hydroureter with urinary obstruction,  POA: Presenting with fever, tachycardia, tachypnea, leukocytosis, lactic acidosis 2.1, and AKI.  Urology consulted and patient underwent cystoscopy and left ureteral stent placement by Dr. Alfonse Spruce on 02/19/2022.  Urology to set her up for left ureteroscopy with the stent extraction in 1 to 2 weeks.  Urine culture growing Proteus Mirabella's-mostly pansensitive except nitrofurantoin, blood culture negative to date.  Patient had spikes of fever of 101.6 and 102.8 on 7/25.  Is on appropriate antibiotics.  She has defervesced since yesterday but if has recurrent fevers, will consider further evaluation which may include repeating blood cultures, and ID consultation and broadening of IV antibiotics.  Will check renal ultrasound to rule out abscess etc.   Acute on chronic heart failure with preserved ejection fraction (HFpEF) ([place]) EF 55-60% by TTE 08/12/2020.  Mild pulmonary edema noted on CXR after initial sepsis fluid resuscitation.  Resume home dose of Lasix 40 mg p.o. daily, in addition, give her an extra dose of IV Lasix 20 mg 7/24.  Clinically euvolemic.  Acute respiratory failure with hypoxia: Suspected due to decompensated CHF.  As per RN communication, failed attempt to wean off oxygen yesterday with oxygen saturations in the 80s.  Continue incentive spirometry, oral Lasix and attempt to wean again.  Reassess home oxygen needs at time of discharge.  Not on home oxygen PTA.  Acute renal failure superimposed on stage 3a chronic kidney disease (HCC) Creatinine 1.54 on admission compared to baseline of 1.1.  AKI in setting of ureteral obstruction and severe sepsis.  Slowly improving, down to 1.42 on 7/26.  Due to CHF, unable to give her IV fluids.  Continue to trend BMP.   CAD S/P percutaneous coronary angioplasty S/p  prior DES to RCA and mid LAD.  Stable, denies any recent chest pain. -Home Plavix and rosuvastatin have already been resumed.   Insulin dependent type 2 diabetes mellitus ([place]): Following  adjustment of Lantus to 15 units twice daily and SSI, CBGs better controlled.    Hypertension associated with diabetes [facility]): Blood pressure controlled, continue home Lopressor.   Depression with anxiety Continue sertraline.   Hyperlipidemia associated with type 2 diabetes mellitus (HCC) Continue rosuvastatin.   Rheumatoid arthritis ([place]) Hold home methotrexate in the context of severe sepsis..  DVT prophylaxis: enoxaparin (LOVENOX) injection 40 mg Start: 02/20/22 2200 SCDs Start: 02/19/22 1950   Code Status: Full Code  Family Communication: None at bedside.  Status is: Inpatient Remains inpatient appropriate because: Due to ongoing fever spikes, concern for residual infectious etiology, ongoing work-up for same, evaluation for AKI, care expected to cross several more days.   Estimated body mass index is 35.94 kg/m as calculated from the following:   Height as of this encounter: '5\' 1"'  (1.549 m).   Weight as of this encounter: 86.3 kg.    Nutritional Assessment: Body mass index is 35.94 kg/m.Marland Kitchen   Consultants:  Urology  Procedures:  As above  Antimicrobials:  Anti-infectives (From admission, onward)    Start     Dose/Rate Route Frequency Ordered Stop   02/19/22 2000  cefTRIAXone (ROCEPHIN) 2 g in sodium chloride 0.9 % 100 mL IVPB        2 g 200 mL/hr over 30 Minutes Intravenous Every 24 hours 02/19/22 1951 02/26/22 1959   02/19/22 1845  vancomycin (VANCOCIN) IVPB 1000 mg/200 mL premix        1,000 mg 200 mL/hr over 60 Minutes Intravenous  Once 02/19/22 1836 02/19/22 1952   02/19/22 1800  piperacillin-tazobactam (ZOSYN) IVPB 3.375 g  Status:  Discontinued        3.375 g 12.5 mL/hr over 240 Minutes Intravenous Every 8 hours 02/19/22 1722 02/19/22 1951         Subjective:  Reports that left-sided abdominal pain has improved.  Had a BM yesterday morning.  Passing flatus.  Tolerating diet but following diet reports gagging and dry heaving.  Per RN, unable to wean  off oxygen yesterday due to hypoxia in the 80s but will try again today.  Objective: Vitals:   02/22/22 0044 02/22/22 0401 02/22/22 0427 02/22/22 0928  BP:  (!) 119/54  (!) 124/52  Pulse:  95  89  Resp: '15 20  19  ' Temp:  100.2 F (37.9 C)  98.9 F (37.2 C)  TempSrc:  Oral  Oral  SpO2:  (!) 85% 95% 91%  Weight:      Height:        Intake/Output Summary (Last 24 hours) at 02/22/2022 0943 Last data filed at 02/22/2022 0857 Gross per 24 hour  Intake --  Output 4200 ml  Net -4200 ml   Filed Weights   02/19/22 2156 02/20/22 0336 02/21/22 0404  Weight: 83.9 kg 86.7 kg 86.3 kg    Examination:  General exam: Young female, moderately built and obese lying comfortably supine in bed. Respiratory system: Slightly diminished breath sounds in the bases but otherwise clear to auscultation without wheezing, rhonchi or crackles. Cardiovascular system: S1 and S2 heard, RRR.  No JVD, murmurs or pedal edema.  Telemetry personally reviewed: Sinus rhythm with BBB morphology. Gastrointestinal system: Abdomen is nondistended, soft and nontender.  No organomegaly or masses appreciated.  Normal bowel sounds heard. Genitourinary: Foley catheter with minimal gross  hematuria. Central nervous system: Alert and oriented. No focal neurological deficits. Extremities: Symmetric 5 x 5 power. Skin: No rashes, lesions or ulcers.  Psychiatry: Judgement and insight appear normal. Mood & affect appropriate.   Data Reviewed: I have personally reviewed following labs and imaging studies  CBC: Recent Labs  Lab 02/19/22 1228 02/20/22 0553 02/21/22 0441 02/22/22 0559  WBC 25.6* 26.8* 18.7* 14.1*  NEUTROABS 21.8*  --  15.9*  --   HGB 15.0 12.9 11.6* 11.6*  HCT 46.5* 41.2 36.9 34.8*  MCV 92.1 95.2 95.6 90.6  PLT 380 301 248 785   Basic Metabolic Panel: Recent Labs  Lab 02/19/22 1228 02/20/22 0553 02/21/22 0441 02/22/22 0559  NA 141 139 139 139  K 3.3* 3.7 3.7 3.7  CL 104 108 108 105  CO2 26 21* 21*  23  GLUCOSE 106* 362* 321* 205*  BUN 18 21* 23* 19  CREATININE 1.54* 1.54* 1.46* 1.42*  CALCIUM 9.6 8.9 8.4* 8.4*  MG  --  2.3 2.0  --    GFR: Estimated Creatinine Clearance: 44.7 mL/min (A) (by C-G formula based on SCr of 1.42 mg/dL (H)). Liver Function Tests: Recent Labs  Lab 02/19/22 1228 02/20/22 0553  AST 15 16  ALT 8 8  ALKPHOS 118 97  BILITOT 1.4* 1.0  PROT 8.7* 7.5  ALBUMIN 3.8 3.1*   Recent Labs  Lab 02/19/22 1228  LIPASE 27    HbA1C: Recent Labs    02/20/22 0553  HGBA1C 7.2*   CBG: Recent Labs  Lab 02/21/22 1126 02/21/22 1231 02/21/22 1645 02/21/22 2034 02/22/22 0735  GLUCAP 280* 236* 185* 213* 184*    Sepsis Labs: Recent Labs  Lab 02/19/22 1516 02/19/22 1711 02/20/22 0553  PROCALCITON  --   --  2.70  LATICACIDVEN 2.1* 1.8  --     Recent Results (from the past 240 hour(s))  Culture, blood (routine x 2)     Status: None (Preliminary result)   Collection Time: 02/19/22  3:16 PM   Specimen: BLOOD  Result Value Ref Range Status   Specimen Description   Final    BLOOD RIGHT ANTECUBITAL Performed at [facility], [place] [address], [place], [place] [address]    Special Requests   Final    BOTTLES DRAWN AEROBIC AND ANAEROBIC Blood Culture adequate volume   Culture   Final    NO GROWTH 3 DAYS Performed at [facility], [place] [address] [place], [place] [address]    Report Status PENDING  Incomplete  Culture, blood (routine x 2)     Status: None (Preliminary result)   Collection Time: 02/19/22  3:21 PM   Specimen: BLOOD  Result Value Ref Range Status   Specimen Description BLOOD RIGHT ANTECUBITAL  Final   Special Requests   Final    BOTTLES DRAWN AEROBIC ONLY Blood Culture adequate volume   Culture   Final    NO GROWTH 3 DAYS Performed at [facility], [place] [address], [place], [place] [address]    Report Status PENDING  Incomplete  Urine Culture     Status: Abnormal   Collection Time: 02/19/22  5:47 PM    Specimen: Urine, Clean Catch  Result Value Ref Range Status   Specimen Description   Final    URINE, CLEAN CATCH Performed at [facility], [place] [address], [place], [place] [address]    Special Requests   Final    NONE Performed at [facility],  [place] [address], [place], [place] [address]    Culture >=100,000 COLONIES/mL PROTEUS MIRABILIS (A)  Final   Report Status 02/21/2022 FINAL  Final   Organism ID, Bacteria PROTEUS MIRABILIS (A)  Final      Susceptibility   Proteus mirabilis - MIC*    AMPICILLIN <=2 SENSITIVE Sensitive     CEFAZOLIN 8 SENSITIVE Sensitive     CEFEPIME <=0.12 SENSITIVE Sensitive     CEFTRIAXONE <=0.25 SENSITIVE Sensitive     CIPROFLOXACIN <=0.25 SENSITIVE Sensitive     GENTAMICIN <=1 SENSITIVE Sensitive     IMIPENEM 2 SENSITIVE Sensitive     NITROFURANTOIN 128 RESISTANT Resistant     TRIMETH/SULFA <=20 SENSITIVE Sensitive     AMPICILLIN/SULBACTAM <=2 SENSITIVE Sensitive     PIP/TAZO <=4 SENSITIVE Sensitive     * >=100,000 COLONIES/mL PROTEUS MIRABILIS     Radiology Studies: No results found.  Scheduled Meds:  Chlorhexidine Gluconate Cloth  6 each Topical Daily   clopidogrel  75 mg Oral Daily   enoxaparin (LOVENOX) injection  40 mg Subcutaneous Q24H   famotidine  20 mg Oral BID   folic acid  6,148 mcg Oral Daily   furosemide  40 mg Oral Daily   insulin aspart  0-15 Units Subcutaneous TID WC   insulin aspart  0-5 Units Subcutaneous QHS   insulin glargine-yfgn  15 Units Subcutaneous BID   metoprolol tartrate  25 mg Oral BID   pantoprazole  40 mg Oral Daily   rosuvastatin  40 mg Oral Daily   sertraline  150 mg Oral Daily   sodium chloride flush  3 mL Intravenous Q12H   Continuous Infusions:  sodium chloride 10 mL/hr at 02/21/22 0435   cefTRIAXone (ROCEPHIN)  IV 2 g (02/21/22 1956)     LOS: 3 days   Vernell Leep, MD,  FACP, [facility], [facility], [facility] (Care Management Physician Certified) [facility]  [place]  To contact the attending provider between 7A-7P or the covering provider during after hours 7P-7A, please log into the web site [email or web address] and access using universal [place] password for that web site. If you do not have the password, please call the hospital operator.

## 2022-02-22 NOTE — Telephone Encounter (Signed)
PT CURRENTLY ADMITTED TO HOSPITAL

## 2022-02-22 NOTE — Progress Notes (Signed)
Patient assisted to bathroom on RA, SpO2 93% throughout and upon return to bed.  After 10 min laying flat in bed, SpO2 82% on RA.  HOB raised to 30 degrees, O2 applied at 2 L, SpO2 increased to 93%.  Bradd Burner, RN

## 2022-02-22 NOTE — Progress Notes (Signed)
  [facility] AND UROLOGY [address] [place], [place]  [address] Phone:  [phone]  February 22, 2022     Patient: [name] [name]  Date of Birth: 04/29/1966  Date of Visit: 02/19/2022                To Whom It May Concern:  [name] [name] has been hospitalized 02/19/22 until present.  She remains inpatient on this date and will be unable to attend court. Please reach out for any questions/concerns.  Thank you, Marcello Moores - 4th Floor Progressive [facility] and Urology

## 2022-02-22 NOTE — Plan of Care (Signed)

## 2022-02-23 LAB — BASIC METABOLIC PANEL
Anion gap: 13 (ref 5–15)
BUN: 19 mg/dL (ref 6–20)
CO2: 24 mmol/L (ref 22–32)
Calcium: 8.8 mg/dL — ABNORMAL LOW (ref 8.9–10.3)
Chloride: 102 mmol/L (ref 98–111)
Creatinine, Ser: 1.23 mg/dL — ABNORMAL HIGH (ref 0.44–1.00)
GFR, Estimated: 52 mL/min — ABNORMAL LOW (ref >60)
Glucose, Bld: 261 mg/dL — ABNORMAL HIGH (ref 70–99)
Potassium: 3.1 mmol/L — ABNORMAL LOW (ref 3.5–5.1)
Sodium: 139 mmol/L (ref 135–145)

## 2022-02-23 LAB — GLUCOSE, CAPILLARY
Glucose-Capillary: 261 mg/dL — ABNORMAL HIGH (ref 70–99)
Glucose-Capillary: 218 mg/dL — ABNORMAL HIGH (ref 70–99)
Glucose-Capillary: 242 mg/dL — ABNORMAL HIGH (ref 70–99)

## 2022-02-23 LAB — CBC
HCT: 38 % (ref 36.0–46.0)
Hemoglobin: 12.3 g/dL (ref 12.0–15.0)
MCH: 29.7 pg (ref 26.0–34.0)
MCHC: 32.4 g/dL (ref 30.0–36.0)
MCV: 91.8 fL (ref 80.0–100.0)
Platelets: 330 10*3/uL (ref 150–400)
RBC: 4.14 MIL/uL (ref 3.87–5.11)
RDW: 17.2 % — ABNORMAL HIGH (ref 11.5–15.5)
WBC: 10.6 10*3/uL — ABNORMAL HIGH (ref 4.0–10.5)
nRBC: 0 % (ref 0.0–0.2)

## 2022-02-23 LAB — MAGNESIUM: Magnesium: 2.2 mg/dL (ref 1.7–2.4)

## 2022-02-23 MED ORDER — SERTRALINE HCL 100 MG PO TABS: 150.0000 mg | ORAL_TABLET | Freq: Every day | ORAL | Status: DC

## 2022-02-23 MED ORDER — INSULIN ASPART 100 UNIT/ML IJ SOLN
3.0000 [IU] | Freq: Once | INTRAMUSCULAR | Status: AC
Start: 2022-02-23 — End: 2022-02-23
  Administered 2022-02-23: 3 [IU] via SUBCUTANEOUS

## 2022-02-23 MED ORDER — POLYETHYLENE GLYCOL 3350 17 G PO PACK: 17.0000 g | PACK | Freq: Every day | ORAL | 0 refills | Status: DC

## 2022-02-23 MED ORDER — POTASSIUM CHLORIDE CRYS ER 20 MEQ PO TBCR
40.0000 meq | EXTENDED_RELEASE_TABLET | Freq: Once | ORAL | Status: AC
  Administered 2022-02-23: 40 meq via ORAL
  Filled 2022-02-23: qty 2

## 2022-02-23 MED ORDER — ACETAMINOPHEN 325 MG PO TABS: 650.0000 mg | ORAL_TABLET | Freq: Four times a day (QID) | ORAL | Status: AC | PRN

## 2022-02-23 MED ORDER — CEPHALEXIN 500 MG PO CAPS
500.0000 mg | ORAL_CAPSULE | Freq: Three times a day (TID) | ORAL | Status: DC
  Administered 2022-02-23: 500 mg via ORAL
  Filled 2022-02-23: qty 1

## 2022-02-23 MED ORDER — CEPHALEXIN 500 MG PO CAPS: 500.0000 mg | ORAL_CAPSULE | Freq: Three times a day (TID) | ORAL | 0 refills | Status: AC

## 2022-02-23 MED ORDER — LANTUS SOLOSTAR 100 UNIT/ML ~~LOC~~ SOPN: 12.0000 [IU] | PEN_INJECTOR | Freq: Two times a day (BID) | SUBCUTANEOUS | Status: AC

## 2022-02-23 MED ORDER — SENNOSIDES-DOCUSATE SODIUM 8.6-50 MG PO TABS: 1.0000 | ORAL_TABLET | Freq: Every evening | ORAL | 0 refills | Status: DC | PRN

## 2022-02-23 NOTE — Discharge Summary (Signed)
Physician Discharge Summary  [name] [name] ZOX:[id number] DOB: [birth year]  PCP: Mack Hook, MD  Admitted from: Home Discharged to: Home  Admit date: 02/19/2022 Discharge date: 02/23/2022  Recommendations for Outpatient Follow-up:    Follow-up Information     Irine Seal, MD Follow up.   Specialty: Urology Why: MD will arrange office appointment to be seen in 10 to 14 days prior to scheduled ureteroscopy on 03/14/2022. Contact information: [place] [address] [phone]         Mack Hook, MD. Schedule an appointment as soon as possible for a visit in 1 week(s).   Specialty: Internal Medicine Why: To be seen with repeat labs (CBC & BMP).  Kindly re evaluate home oxygen needs (patient being discharged home on oxygen). Contact information: [place] [address] [phone]         Jettie Booze, MD .   Specialties: Cardiology, Radiology, Interventional Cardiology Contact information: [address] [place] [address] [place]: None    Equipment/Devices:     Durable Medical Equipment  (From admission, onward)           Start     Ordered   02/23/22 1552  DME Oxygen  Once       Question Answer Comment  Length of Need 6 Months   Mode or (Route) Nasal cannula   Liters per Minute 2   Frequency Continuous (stationary and portable oxygen unit needed)   Oxygen conserving device Yes   Oxygen delivery system Gas      02/23/22 1600             Discharge Condition: Improved and stable    Code Status: Full Code Diet recommendation:  Discharge Diet Orders (From admission, onward)     Start     Ordered   02/23/22 0000  Diet - low sodium heart healthy        02/23/22 1600   02/23/22 0000  Diet Carb Modified        02/23/22 1600             Discharge Diagnoses:  Principal Problem:   Severe sepsis ([place]) Active Problems:   Left  hydronephrosis and hydroureter with urinary obstruction due to infected ureteral calculus   Acute renal failure superimposed on stage 3a chronic kidney disease (HCC)   Chronic heart failure with preserved ejection fraction (HFpEF) (HCC)   CAD S/P percutaneous coronary angioplasty   Insulin dependent type 2 diabetes mellitus ([place])   Hypertension associated with diabetes ([place])   Rheumatoid arthritis ([place])   Hyperlipidemia associated with type 2 diabetes mellitus ([place])   Depression with anxiety  Brief Hospital course: [name] [name] is a 56 y.o. female with medical history significant for multivessel CAD (s/p DES to mid and distal RCA 2012, DES to mid LAD 2022), HFpEF (EF 55-60% by TTE 08/12/2020), CKD stage IIIa, rheumatoid arthritis on methotrexate, T2DM, HTN, HLD, depression/anxiety, tobacco use who presented to the ED for evaluation of new onset left flank pain and fevers, dysuria with decreased urine output, nausea, vomiting, chills and diaphoresis.   She met sepsis criteria on admission including fever of 103.1 F, tachycardic up to 120s, tachypneic up to 31, leukocytosis of 25.6 and suspected UTI.  She also had AKI and lactic acidosis on admission. CT abdomen/pelvis with contrast shows left-sided hydronephrosis and hydroureter to  the level of the left UVJ where there is a 6 mm stone.    She was treated with IV fluids, started empirically on IV vancomycin and Zosyn and urology was consulted.  She was finally determined to have severe sepsis secondary to infected and obstructing left ureteral calculus with UTI/acute pyelonephritis with hydronephrosis and hydroureter.     Assessment & Plan:   Severe sepsis secondary to infected and obstructing left ureteral calculus/left hydronephrosis and hydroureter with urinary obstruction, POA: Presenting with fever, tachycardia, tachypnea, leukocytosis, lactic acidosis 2.1, and AKI.  Urology consulted and patient underwent cystoscopy and left ureteral stent  placement by Dr. Alfonse Spruce on 02/19/2022.  Urology has set her up for left ureteroscopy with the stent extraction on 03/14/2022.  Per my communication with Dr. Jeffie Pollock, his office will arrange follow-up 10 to 14 days prior to scheduled procedure date.  Urine culture growing Proteus Mirabella's-mostly pansensitive except resistant to nitrofurantoin, blood culture negative to date.  Patient had spikes of fever of 101.6 and 102.8 on 7/25.  She has defervesced since.  She has completed 4 days of IV [place]-dose ceftriaxone.  As communicated with Dr. Jeffie Pollock, DC Foley catheter, recommends total 10 days of antibiotics (transitioned to Keflex at DC), cleared for DC and outpatient follow-up as noted above.  Renal ultrasound 7/26 showed normal sonographic appearance of both kidneys and no residual left hydronephrosis.  Severe sepsis resolved.  Clinically improved.   Acute on chronic heart failure with preserved ejection fraction (HFpEF) ([place]) EF 55-60% by TTE 08/12/2020.  Mild pulmonary edema noted on CXR after initial sepsis fluid resuscitation.  Resume home dose of Lasix 40 mg p.o. daily, in addition, give her an extra dose of IV Lasix 20 mg 7/24.  Clinically euvolemic.  Minimal and improving hematuria.   Acute respiratory failure with hypoxia: Suspected due to decompensated CHF.  As per RN communication, failed attempt to wean off oxygen on 7/26 with oxygen saturations in the 80s.  Continue incentive spirometry, oral Lasix and failed attempt to wean again.  Qualifies for home oxygen, ordered same.  She may have chronic respiratory failure with hypoxia as well related to COPD with ongoing tobacco use and may even have underlying OSA/OHS given her body habitus and will need to be evaluated for same with an outpatient sleep study.  Also consider outpatient pulmonology consultation if not already in place.   Acute renal failure superimposed on stage 3a chronic kidney disease (HCC) Creatinine 1.54 on admission compared to baseline  of 1.1.  AKI in setting of ureteral obstruction and severe sepsis.  Slowly improving, down to 1.23 on day of discharge.  Follow BMP closely as outpatient.  Hypokalemia: Replaced prior to discharge.  Magnesium 2.2.   CAD S/P percutaneous coronary angioplasty S/p prior DES to RCA and mid LAD.  Stable, denies any recent chest pain. -Home Plavix and rosuvastatin have already been resumed.   Insulin dependent type 2 diabetes mellitus ([place]): A1c on 7/24: 7.2.  This was 6.7 in January 2022.  Not sure regarding compliance with diet or medications.  Slightly increased home Lantus dose as noted below.  Continue SSI.  Recommend strict compliance with all aspects of DM care and close outpatient follow-up with PCP.   Hypertension associated with diabetes [facility]): Blood pressure controlled, continue home Lopressor.   Depression with anxiety Continue sertraline.   Hyperlipidemia associated with type 2 diabetes mellitus (HCC) Continue rosuvastatin.   Rheumatoid arthritis (HCC) Methotrexate which was briefly held in the hospital in the context of  severe sepsis will be resumed at time of discharge.  Continue folate supplements.     Estimated body mass index is 35.94 kg/m as calculated from the following:   Height as of this encounter: '5\' 1"'  (1.549 m).   Weight as of this encounter: 86.3 kg.   Nutritional Assessment: Body mass index is 35.94 kg/m.Marland Kitchen     Consultants:  Urology   Procedures:  As above   Discharge Instructions  Discharge Instructions     ([place]) Call MD:  Anytime you have any of the following symptoms: 1) 3 pound weight gain in 24 hours or 5 pounds in 1 week 2) shortness of breath, with or without a dry hacking cough 3) swelling in the hands, feet or stomach 4) if you have to sleep on extra pillows at night in order to breathe.   Complete by: As directed    Call MD for:  difficulty breathing, headache or visual disturbances   Complete by: As directed    Call MD  for:  extreme fatigue   Complete by: As directed    Call MD for:  persistant dizziness or light-headedness   Complete by: As directed    Call MD for:  persistant nausea and vomiting   Complete by: As directed    Call MD for:  severe uncontrolled pain   Complete by: As directed    Call MD for:  temperature >100.4   Complete by: As directed    Diet - low sodium heart healthy   Complete by: As directed    Diet Carb Modified   Complete by: As directed    Increase activity slowly   Complete by: As directed    No wound care   Complete by: As directed         Medication List     STOP taking these medications    Accu-Chek Guide test strip Generic drug: glucose blood   gabapentin 100 MG capsule Commonly known as: NEURONTIN   mupirocin ointment 2 % Commonly known as: BACTROBAN   Nicotrol NS 10 MG/ML Soln Generic drug: Nicotine       TAKE these medications    Accu-Chek FastClix Lancets Misc   Accu-Chek Guide Me w/Device Kit   acetaminophen 325 MG tablet Commonly known as: TYLENOL Take 2 tablets (650 mg total) by mouth every 6 (six) hours as needed for mild pain or fever. What changed:  medication strength how much to take reasons to take this   albuterol 108 (90 Base) MCG/ACT inhaler Commonly known as: VENTOLIN HFA Inhale 2 puffs into the lungs every 6 (six) hours as needed for wheezing or shortness of breath.   ammonium lactate 12 % cream Commonly known as: AMLACTIN Apply topically as needed for dry skin.   cephALEXin 500 MG capsule Commonly known as: KEFLEX Take 1 capsule (500 mg total) by mouth 3 (three) times daily for 17 doses.   clopidogrel 75 MG tablet Commonly known as: PLAVIX TAKE 1 TABLET BY MOUTH EVERY DAY   cyclobenzaprine 10 MG tablet Commonly known as: FLEXERIL Take 1-2 tablets (10-20 mg total) by mouth at bedtime as needed for muscle spasms. Muscle spasms   Dexcom G6 Receiver Devi AS DIRECTED 364 DAYS   Dexcom G6 Transmitter Misc AS  DIRECTED 90 DAYS   famotidine 20 MG tablet Commonly known as: PEPCID Take 20 mg by mouth 2 (two) times daily.   folic acid 742 MCG tablet Commonly known as: FOLVITE Take 1,200 mcg by mouth  daily.   FreeStyle Libre 2 Sensor Misc Apply topically every other day.   furosemide 40 MG tablet Commonly known as: LASIX TAKE 1 TABLET BY MOUTH EVERY DAY IN THE MORNING What changed: See the new instructions.   insulin aspart 100 UNIT/ML injection Commonly known as: novoLOG Inject 4-16 Units into the skin 3 (three) times daily with meals. Per Sliding scale CBG 100-150: 4 units, 151-200: 6 units, 201-250: 8 units, 251-300: 10 units, 301-350: 12 units, 351-400: 14 units, > 401; call doctor   Lantus SoloStar 100 UNIT/ML Solostar Pen Generic drug: insulin glargine Inject 12 Units into the skin 2 (two) times daily. What changed: how much to take   methotrexate 2.5 MG tablet Commonly known as: RHEUMATREX Take 10 mg by mouth every Wednesday.   metoprolol tartrate 25 MG tablet Commonly known as: LOPRESSOR TAKE 1 TABLET BY MOUTH TWICE A DAY What changed: how much to take   nitroGLYCERIN 0.4 MG SL tablet Commonly known as: NITROSTAT PLACE 1 TABLET UNDER THE TONGUE EVERY 5 MINUTES AS NEEDED FOR CHEST PAIN. MAX 3 DOSES, CALL 911.   ondansetron 4 MG tablet Commonly known as: ZOFRAN 1 tab by mouth every 6 hours as needed for nausea and vomiting.   pantoprazole 40 MG tablet Commonly known as: PROTONIX Take 40 mg by mouth daily.   Poise Ultra Thins Pads Use 5 pads daily as needed for urinary stress incontinence   polyethylene glycol 17 g packet Commonly known as: MIRALAX / GLYCOLAX Take 17 g by mouth daily. Start taking on: February 24, 2022   prednisoLONE acetate 1 % ophthalmic suspension Commonly known as: PRED FORTE Place 4-5 drops into the left eye daily.   rosuvastatin 40 MG tablet Commonly known as: CRESTOR TAKE 1 TABLET BY MOUTH EVERY DAY   senna-docusate 8.6-50 MG  tablet Commonly known as: Senokot-S Take 1 tablet by mouth at bedtime as needed for mild constipation or moderate constipation.   sertraline 100 MG tablet Commonly known as: ZOLOFT Take 1.5 tablets (150 mg total) by mouth daily. What changed: See the new instructions.   traMADol 50 MG tablet Commonly known as: ULTRAM TAKE 1 TABLET EVERY 6 HOURS AS NEEDED FOR MODERATE PAIN What changed: See the new instructions.   Vitamin D3 10 MCG (400 UNIT) tablet 2 tabs by mouth daily.       Allergies  Allergen Reactions   Aspirin Other (See Comments)    Stomach bleeds       Procedures/Studies: US RENAL  Result Date: 02/22/2022 CLINICAL DATA:  Fever and elevated creatinine. Status post dome removal and double-J ureteral stent placement on the left. EXAM: RENAL / URINARY TRACT ULTRASOUND COMPLETE COMPARISON:  CT scan from 02/19/2022 FINDINGS: Right Kidney: Renal measurements: 11.3 x 4.7 x 5.2 cm = volume: 147.3 mL. Normal renal cortical thickness and echogenicity without focal lesions or hydronephrosis. Renal calculi seen on the CT scan are not well demonstrated on this exam. Left Kidney: Renal measurements: 10.1 x 5.0 x 5.1 cm = volume: 134.0 mL. Normal renal cortical thickness and echogenicity without focal lesions or hydronephrosis. Renal calculi seen on the prior CT scan are not well demonstrated on this exam. Bladder: Bladder is decompressed by a Foley catheter. Other: None. IMPRESSION: 1. Normal sonographic appearance of both kidneys. No residual left-sided hydronephrosis. 2. Renal calculi seen on the CT scan not well demonstrated on this ultrasound exam. 3. Bladder decompressed by Foley catheter. Electronically Signed   By: Marijo Sanes M.D.   On: 02/22/2022  11:32   DG C-Arm 1-60 Min-No Report  Result Date: 02/19/2022 Fluoroscopy was utilized by the requesting physician.  No radiographic interpretation.   DG Chest Port 1 View  Result Date: 02/19/2022 CLINICAL DATA:  Hypoxemia EXAM:  PORTABLE CHEST 1 VIEW COMPARISON:  None Available. FINDINGS: Cardiomegaly. Mild, diffuse bilateral interstitial pulmonary opacity. The visualized skeletal structures are unremarkable. IMPRESSION: Cardiomegaly with mild, diffuse bilateral interstitial pulmonary opacity, likely edema. No focal airspace opacity. Electronically Signed   By: Delanna Ahmadi M.D.   On: 02/19/2022 18:31   CT Abdomen Pelvis W Contrast  Result Date: 02/19/2022 CLINICAL DATA:  Abdominal pain. EXAM: CT ABDOMEN AND PELVIS WITH CONTRAST TECHNIQUE: Multidetector CT imaging of the abdomen and pelvis was performed using the standard protocol following bolus administration of intravenous contrast. RADIATION DOSE REDUCTION: This exam was performed according to the departmental dose-optimization program which includes automated exposure control, adjustment of the mA and/or kV according to patient size and/or use of iterative reconstruction technique. CONTRAST:  57m OMNIPAQUE IOHEXOL 300 MG/ML  SOLN COMPARISON:  09/02/2019 FINDINGS: Lower chest: Atelectasis noted within the left lower lobe. Hepatobiliary: No focal liver abnormality identified. Gallbladder appears normal. No bile duct dilatation. Pancreas: Unremarkable. No pancreatic ductal dilatation or surrounding inflammatory changes. Spleen: Normal in size without focal abnormality. Adrenals/Urinary Tract: Normal adrenal glands. Multiple right renal calcifications are identified within the upper pole which are likely vascular in etiology. Within the inferior pole of the right kidney there are 2 small calcifications which may be vascular or represent small kidney stones. These measure up to 3 mm. There is no right-sided hydronephrosis or mass. Multiple left renal calcifications are identified which represent a combination of vascular calcifications and kidney stones. One large stone versus conglomeration of stones noted in the inferior pole of the left kidney measuring 8 mm. There is asymmetric  left-sided nephromegaly with perinephric fat stranding and hydronephrosis. Left-sided hydroureter is identified to the level of the left UVJ. At the left UVJ there is a 6 mm stone, image 72/2. Stomach/Bowel: Stomach is within normal limits. Appendix appears normal. No evidence of bowel wall thickening, distention, or inflammatory changes. Vascular/Lymphatic: Aortic atherosclerosis. Enlarged left periaortic lymph node measures 1.2 cm, image 33/2. This is compared with 0.7 cm previously. No pelvic or inguinal adenopathy. Reproductive: Multiple calcified uterine fibroids are identified. No adnexal mass. Other: There is a small volume of free fluid extending along the left pericolic gutter and left retroperitoneum. Musculoskeletal: No acute or significant osseous findings. IMPRESSION: 1. Left-sided hydronephrosis and hydroureter to the level of the left UVJ where there is a 6 mm stone. There is asymmetric left-sided perinephric fat stranding and fluid as well as periureteral soft tissue stranding. Superimposed pyelonephritis cannot be excluded. 2. Multiple left renal calcifications which represent a combination of vascular calcifications and kidney stones. 3. Small volume of free fluid extending along the left pericolic gutter and left retroperitoneum. 4. Enlarged left periaortic lymph node is nonspecific, but favored to be reactive in etiology. 5. Calcified uterine fibroids. 6. Aortic Atherosclerosis (ICD10-I70.0). Electronically Signed   By: TKerby MoorsM.D.   On: 02/19/2022 17:14    Subjective: Patient denies complaints.  Reports that she feels "good".  Tolerated diet without nausea, vomiting.  No pain reported.  Having BMs.  As per RN, no acute issues noted.  Discharge Exam:  Vitals:   02/23/22 0530 02/23/22 0813 02/23/22 0957 02/23/22 1315  BP: (!) 152/75 105/68 117/61 109/64  Pulse: 79 73 75 71  Resp: 18 18  14  Temp: 98.2 F (36.8 C)   98 F (36.7 C)  TempSrc: Oral   Oral  SpO2: 96% 99%  98%   Weight:      Height:        General exam: Young female, moderately built and obese lying comfortably supine in bed.  Looks much improved even compared to yesterday.  In good spirits. Respiratory system: Clear to auscultation.  No increased work of breathing. Cardiovascular system: S1 and S2 heard, RRR.  No JVD, murmurs or pedal edema.  Telemetry personally reviewed: Sinus rhythm. Gastrointestinal system: Abdomen is nondistended, soft and nontender.  No organomegaly or masses appreciated.  Normal bowel sounds heard. Genitourinary: Foley catheter with improving and minimal hematuria. Central nervous system: Alert and oriented. No focal neurological deficits. Extremities: Symmetric 5 x 5 power. Skin: No rashes, lesions or ulcers.  Psychiatry: Judgement and insight appear normal. Mood & affect appropriate.     The results of significant diagnostics from this hospitalization (including imaging, microbiology, ancillary and laboratory) are listed below for reference.     Microbiology: Recent Results (from the past 240 hour(s))  Culture, blood (routine x 2)     Status: None (Preliminary result)   Collection Time: 02/19/22  3:16 PM   Specimen: BLOOD  Result Value Ref Range Status   Specimen Description   Final    BLOOD RIGHT ANTECUBITAL Performed at [facility] [address], [place], [place] [address]    Special Requests   Final    BOTTLES DRAWN AEROBIC AND ANAEROBIC Blood Culture adequate volume   Culture   Final    NO GROWTH 4 DAYS Performed at [facility], [place] [address], [place], [place] [address]    Report Status PENDING  Incomplete  Culture, blood (routine x 2)     Status: None (Preliminary result)   Collection Time: 02/19/22  3:21 PM   Specimen: BLOOD  Result Value Ref Range Status   Specimen Description BLOOD RIGHT ANTECUBITAL  Final   Special Requests   Final    BOTTLES DRAWN AEROBIC ONLY Blood Culture adequate volume   Culture   Final    NO  GROWTH 4 DAYS Performed at [facility], [place] [address] [place], [place] [address]    Report Status PENDING  Incomplete  Urine Culture     Status: Abnormal   Collection Time: 02/19/22  5:47 PM   Specimen: Urine, Clean Catch  Result Value Ref Range Status   Specimen Description   Final    URINE, CLEAN CATCH Performed at [facility], [place] [address] [place], [place] [address]    Special Requests   Final    NONE Performed at [facility], [place] [address], [place],  [address]    Culture >=100,000 COLONIES/mL PROTEUS MIRABILIS (A)  Final   Report Status 02/21/2022 FINAL  Final   Organism ID, Bacteria PROTEUS MIRABILIS (A)  Final      Susceptibility   Proteus mirabilis - MIC*    AMPICILLIN <=2 SENSITIVE Sensitive     CEFAZOLIN 8 SENSITIVE Sensitive     CEFEPIME <=0.12 SENSITIVE Sensitive     CEFTRIAXONE <=0.25 SENSITIVE Sensitive     CIPROFLOXACIN <=0.25 SENSITIVE Sensitive     GENTAMICIN <=1 SENSITIVE Sensitive     IMIPENEM 2 SENSITIVE Sensitive     NITROFURANTOIN 128 RESISTANT Resistant     TRIMETH/SULFA <=20 SENSITIVE Sensitive     AMPICILLIN/SULBACTAM <=2 SENSITIVE Sensitive  PIP/TAZO <=4 SENSITIVE Sensitive     * >=100,000 COLONIES/mL PROTEUS MIRABILIS     Labs: CBC: Recent Labs  Lab 02/19/22 1228 02/20/22 0553 02/21/22 0441 02/22/22 0559 02/23/22 0526  WBC 25.6* 26.8* 18.7* 14.1* 10.6*  NEUTROABS 21.8*  --  15.9*  --   --   HGB 15.0 12.9 11.6* 11.6* 12.3  HCT 46.5* 41.2 36.9 34.8* 38.0  MCV 92.1 95.2 95.6 90.6 91.8  PLT 380 301 248 263 868    Basic Metabolic Panel: Recent Labs  Lab 02/19/22 1228 02/20/22 0553 02/21/22 0441 02/22/22 0559 02/23/22 0526  NA 141 139 139 139 139  K 3.3* 3.7 3.7 3.7 3.1*  CL 104 108 108 105 102  CO2 26 21* 21* 23 24  GLUCOSE 106* 362* 321* 205* 261*  BUN 18 21* 23* 19 19  CREATININE 1.54* 1.54* 1.46* 1.42* 1.23*  CALCIUM 9.6 8.9 8.4* 8.4* 8.8*  MG  --  2.3 2.0  --   2.2    Liver Function Tests: Recent Labs  Lab 02/19/22 1228 02/20/22 0553  AST 15 16  ALT 8 8  ALKPHOS 118 97  BILITOT 1.4* 1.0  PROT 8.7* 7.5  ALBUMIN 3.8 3.1*    CBG: Recent Labs  Lab 02/22/22 1632 02/22/22 2126 02/23/22 0524 02/23/22 0742 02/23/22 1122  GLUCAP 264* 273* 261* 218* 242*     Urinalysis    Component Value Date/Time   COLORURINE YELLOW 02/19/2022 1747   APPEARANCEUR CLOUDY (A) 02/19/2022 1747   LABSPEC 1.019 02/19/2022 1747   PHURINE 7.0 02/19/2022 1747   GLUCOSEU NEGATIVE 02/19/2022 1747   HGBUR MODERATE (A) 02/19/2022 1747   HGBUR trace-intact 08/25/2010 0841   BILIRUBINUR NEGATIVE 02/19/2022 1747   BILIRUBINUR neg 08/12/2019 1416   KETONESUR 5 (A) 02/19/2022 1747   PROTEINUR 100 (A) 02/19/2022 1747   UROBILINOGEN 0.2 08/12/2019 1416   UROBILINOGEN 1.0 02/21/2015 0432   NITRITE NEGATIVE 02/19/2022 1747   LEUKOCYTESUR LARGE (A) 02/19/2022 1747   I discussed in detail with patient's daughter via phone, updated care and answered all questions.   Time coordinating discharge: 40 minutes  SIGNED:  Vernell Leep, MD,  FACP, [facility], [facility], [facility] (Care Management Physician Certified). [facility]  To contact the attending provider between 7A-7P or the covering provider during after hours 7P-7A, please log into the web site [email or web address] and access using universal [place] password for that web site. If you do not have the password, please call the hospital operator.

## 2022-02-23 NOTE — Progress Notes (Addendum)
Discharge instructions reviewed with the patient. Questions and concerns were denied. The pt is eager to leave and not agreeable to waiting for DME oxygen to arrive. Pt made are to expect a call from [employer] [facility]. Education provided regarding the need for oxygen during ambulation. The pt stated that she will review instruction when returning home. Pt was encouraged to take all medications and to see new, changed and discontinued medications. Pt also encouraged to complete antibiotics. Brief CHF education given, pt encouraged to weigh daily and monitor sx. She is stable, alert, oriented x4 and ambulatory without assistance.

## 2022-02-23 NOTE — Discharge Instructions (Signed)

## 2022-02-23 NOTE — Progress Notes (Signed)
SATURATION QUALIFICATIONS:  Patient Saturations on Room Air at Rest = 93 %  Patient Saturations on Room Air while Ambulating = 86 %  Patient Saturations on 2 Liters of oxygen while Ambulating = 94 %  Please briefly explain why patient needs home oxygen: The patient ambulated 90 feet, she denied worsening SOB but oxygen decreased to 86 % without oxygen. 2 liters of oxygen applied, saturation increased to 94 %.

## 2022-02-23 NOTE — Inpatient Diabetes Management (Signed)
  Inpatient Diabetes Program Recommendations  AACE/ADA: New Consensus Statement on Inpatient Glycemic Control (2015)  Target Ranges:  Prepandial:   less than 140 mg/dL      Peak postprandial:   less than 180 mg/dL (1-2 hours)      Critically ill patients:  140 - 180 mg/dL   Lab Results  Component Value Date   GLUCAP 218 (H) 02/23/2022   HGBA1C 7.2 (H) 02/20/2022    Review of Glycemic Control  Diabetes history: DM2 Outpatient Diabetes medications: Lantus 10 BID, Novolog 4-16 units TID Current orders for Inpatient glycemic control: Semglee 15 BID, Novolog 0-15 TID with meals and 0-5 HS  Post-prandials elevated, would benefit from addition of meal coverage insulin  Inpatient Diabetes Program Recommendations:    Consider adding Novolog 3 units TID with meals if eating > 50%.  Continue to follow.  Thank you. Ailene Ards, RD, LDN, CDE Inpatient Diabetes Coordinator [phone]

## 2022-02-24 ENCOUNTER — Telehealth: Payer: Self-pay

## 2022-02-24 DIAGNOSIS — R0902 Hypoxemia: Secondary | ICD-10-CM

## 2022-02-24 LAB — CULTURE, BLOOD (ROUTINE X 2)
Culture: NO GROWTH
Culture: NO GROWTH
Special Requests: ADEQUATE
Special Requests: ADEQUATE

## 2022-02-24 NOTE — Telephone Encounter (Signed)
Patient called asking to see Dr Delrae Alfred. Patient explained that after being discharged from the hospital, she was given a breathing machine which she does not want. Wants to discuss this with PCP

## 2022-02-24 NOTE — Telephone Encounter (Signed)
Patient called to clarify that she does not want the large oxygen machine she is being given. needs rx for simplygo mini walk around O2 machine. Insurance will cover if rx is sent. Wants order sent to [facility].

## 2022-02-27 NOTE — Telephone Encounter (Signed)
I s/w the pt and she is agreeable to plan of care for IN OFFICE APPT  for pre op. I confirmed with the pre op provider that the pt would need IN OFFICE appt now instead of tele as the pt was recently admitted to the hospital. Pt has been scheduled to see Eligha Bridegroom, NP 03/02/22 @ 1:55 at the [facility] Location.

## 2022-03-02 ENCOUNTER — Ambulatory Visit: Payer: Medicaid Other | Admitting: Nurse Practitioner

## 2022-03-02 NOTE — Progress Notes (Deleted)
Cardiology Office Note:    Date:  03/02/2022   ID:  [name] [name], DOB March 19, 1966, MRN [id number]  PCP:  Julieanne Manson, MD   [facility] Providers Cardiologist:  Lance Muss, MD { Click to update primary MD,subspecialty MD or APP then REFRESH:1}    Referring MD: Julieanne Manson, MD   Chief Complaint: ***  History of Present Illness:    [name] [name] is a *** 56 y.o. 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  Plavix stopped before completing 27-month course post MI due to bleeding issues after eye surgery.  Chronic chest pain 2020, 2021 after falling off bed.  Cath 07/2020 s/p DES to mid LAD, patent stent to circumflex, severe ISR of RCA with 70% proximal disease and total occlusion of the mid and distal segment.  Plan was for aspirin and Plavix for 1 month followed by indefinite Plavix 75 mg daily.  She was last seen in our office by Jacolyn Reedy, PA on 09/14/2021 for preoperative clearance for facet injections and colonoscopy/endoscopy.  Complaining of a lot of indigestion.  Sharp pain in chest, food stops in center of her chest.  Took 2 NTG last Friday for sharp pain without relief.  Alka-Seltzer has eased the discomfort.  On a number of pain meds since accident last August for back and neck pain.  Walks her dogs 1/2 mile daily which also helps the sharp chest pain.  Has gained 20 pounds since last visit.  She denies chest pain like previous angina.  Smokes one half PPD. Had called quit smoking helpline. She was advised by Dr. Eldridge Dace that she could hold Plavix for 5 days due to post 1 year DES.   Admission 7/23-7/27/23 for urinary obstruction due to infected ureteral calculus and AKI.  During hospitalization she had acute respiratory failure with hypoxia.  Concern for underlying COPD.  Creatinine 1.54 on admission compared to baseline of 1.1.  1.23 on day of discharge.  Today, she is here for preoperative clearance for left  ureteroscopy.   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)    Daily headache    not daily   Depression    Bipolar disorder   Diabetic foot ulcer associated with diabetes mellitus due to underlying condition (HCC) 09/26/2018   Diabetic gastroparesis associated with type 2 diabetes mellitus (HCC)    Diabetic peripheral neuropathy associated with type 2 diabetes mellitus (HCC)    Diabetic retinopathy    Esophageal stenosis    Esophageal ulcer    Esophagitis    Gastroparesis    Genital herpes    Reportedly tested and documented by [facility]--rare occurrences   GERD (gastroesophageal reflux disease)    Heart murmur    History of stomach ulcers    Hyperlipidemia    Hypertension    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   [name] on 12/19/2012, "that's the only one I've had" (12/19/2012)  Migraines    Onychomycosis 02/07/2021   Orthopnoea    Paronychia of finger, left 02/07/2021   Pneumonia 2012   Polysubstance abuse ([place])    Crack cocaine--none since 2008, MJ, ETOH:  clean of all since 2008   Renal insufficiency    Renal lithiasis    Bilateral   Rheumatoid arthritis(714.0)    Sebaceous cyst    Stroke [facility]) 2011   denies residual on 12/19/2012.  "Years ago"   Type II diabetes mellitus ([place])    Previously uncontrolled for many years with multiple complications.  2017 controlled. 05/2016:  6.7%   Vitamin D deficiency     Past Surgical History:  Procedure Laterality Date   CORONARY ANGIOPLASTY WITH STENT PLACEMENT  01/20/2009   "2" (12/19/2012)   CORONARY  ANGIOPLASTY WITH STENT PLACEMENT  2012   "2" (12/19/2012)   CORONARY ANGIOPLASTY WITH STENT PLACEMENT  12/19/2012   "2" (12/19/2012)   CORONARY STENT INTERVENTION N/A 08/13/2020   Procedure: CORONARY STENT INTERVENTION;  Surgeon: Nelva Bush, MD;  Location: [place] CV LAB;  Service: Cardiovascular;  Laterality: N/A;   CYSTOSCOPY W/ URETERAL STENT PLACEMENT Left 02/19/2022   Procedure: CYSTOSCOPY WITH URETERAL STENT PLACEMENT;  Surgeon: Irine Seal, MD;  Location: [facility];  Service: Urology;  Laterality: Left;   ESOPHAGOGASTRODUODENOSCOPY N/A 02/26/2015   Procedure: ESOPHAGOGASTRODUODENOSCOPY (EGD);  Surgeon: Teena Irani, MD;  Location: [facility] ENDOSCOPY;  Service: Endoscopy;  Laterality: N/A;   EYE SURGERY     Multiple surgeries of both eyes:  last laser was 09/08/2015 of right eye.  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   GAS INSERTION Left 07/16/2013   Procedure: INSERTION OF GAS;  Surgeon: Adonis Brook, MD;  Location: [place];  Service: Ophthalmology;  Laterality: Left;  SF6   GAS/FLUID EXCHANGE Left 07/30/2013   Procedure: GAS/FLUID EXCHANGE;  Surgeon: Adonis Brook, MD;  Location: [place];  Service: Ophthalmology;  Laterality: Left;   INTRAVASCULAR PRESSURE WIRE/FFR STUDY N/A 08/13/2020   Procedure: INTRAVASCULAR PRESSURE WIRE/FFR STUDY;  Surgeon: Nelva Bush, MD;  Location: [place] CV LAB;  Service: Cardiovascular;  Laterality: N/A;   IRRIGATION AND DEBRIDEMENT SEBACEOUS CYST Right 03/2011   "pointer" (12/19/2012)   LEFT HEART CATH AND CORONARY ANGIOGRAPHY N/A 08/13/2020   Procedure: LEFT HEART CATH AND CORONARY ANGIOGRAPHY;  Surgeon: Nelva Bush, MD;  Location: [place] CV LAB;  Service: Cardiovascular;  Laterality: N/A;   LEFT HEART CATHETERIZATION WITH CORONARY ANGIOGRAM N/A 07/06/2011   Procedure: LEFT HEART CATHETERIZATION WITH CORONARY ANGIOGRAM;  Surgeon: Jettie Booze, MD;  Location: [facility] CATH LAB;  Service: Cardiovascular;  Laterality: N/A;  possible  PCI   LEFT HEART CATHETERIZATION WITH CORONARY ANGIOGRAM N/A 12/19/2012   Procedure: LEFT HEART CATHETERIZATION WITH CORONARY ANGIOGRAM;  Surgeon: Jettie Booze, MD;  Location: [facility] CATH LAB;  Service: Cardiovascular;  Laterality: N/A;   MEMBRANE PEEL Left 07/16/2013   Procedure: MEMBRANE PEEL;  Surgeon: Adonis Brook, MD;  Location: [place];  Service: Ophthalmology;  Laterality: Left;   PARS PLANA VITRECTOMY Left 07/16/2013   Procedure: PARS PLANA VITRECTOMY WITH 23 GAUGE;  Surgeon: Adonis Brook, MD;  Location: [place];  Service: Ophthalmology;  Laterality: Left;   PARS PLANA VITRECTOMY Left 07/30/2013   Procedure: PARS PLANA VITRECTOMY WITH 23 GAUGE WITH ENDOLASER;  Surgeon: Adonis Brook, MD;  Location: [place];  Service: Ophthalmology;  Laterality: Left;  with endolaser   PERCUTANEOUS CORONARY STENT INTERVENTION (PCI-S) N/A 07/06/2011   Procedure: PERCUTANEOUS CORONARY STENT INTERVENTION (PCI-S);  Surgeon: Jettie Booze, MD;  Location: [facility] CATH LAB;  Service: Cardiovascular;  Laterality: N/A;   PHOTOCOAGULATION WITH LASER Left 07/16/2013   Procedure: PHOTOCOAGULATION WITH LASER;  Surgeon: Adonis Brook, MD;  Location: [place];  Service: Ophthalmology;  Laterality: Left;  ENDOLASER    Current Medications: No outpatient medications have been marked as taking for the 03/02/22 encounter (Appointment) with Ann Maki, Lanice Schwab, NP.     Allergies:   Aspirin   Social History   Socioeconomic History   Marital status: Divorced    Spouse name: Not on file   Number of children: 1   Years of education: 12   Highest education level: [employer] school graduate  Occupational History   Occupation: Disabled   Tobacco Use   Smoking status: Every Day    Packs/day: 0.50    Years: 22.00    Total pack years: 11.00    Types: Cigarettes   Smokeless tobacco: Never   Tobacco comments:    Has been to classes--not sure if she wants to quit.  Changing to non menthol..  Chantix, patches, gum never helped.  9/19:   lot going on.  Vaping Use   Vaping Use: Never used  Substance and Sexual Activity   Alcohol use: Yes    Alcohol/week: 0.0 standard drinks of alcohol    Comment: Extremely rare--once yearly or less.   Drug use: Not Currently    Types: "Crack" cocaine, Marijuana    Comment: 12/31/2012 " clean from crack.  Does still smoke marijuana    Sexual activity: Not Currently    Partners: Male    Birth control/protection: None  Other Topics Concern   Not on file  Social History Narrative   Has lived in [place] for most of life   Disabled   Previously went to [place] and worked as Scientist, water quality.   Lives by herself near [place].   Divorced in 2015.    She is caring for a middle school aged girl, Mackey Birchwood, while her father goes through rehab.     Social Determinants of Health   Financial Resource Strain: Low Risk  (09/03/2020)   Overall Financial Resource Strain (CARDIA)    Difficulty of Paying Living Expenses: Not very hard  Food Insecurity: No Food Insecurity (09/03/2020)   Hunger Vital Sign    Worried About Running Out of Food in the Last Year: Never true    Ran Out of Food in the Last Year: Never true  Transportation Needs: No Transportation Needs (09/03/2020)   PRAPARE - Hydrologist (Medical): No    Lack of Transportation (Non-Medical): No  Physical Activity: Not on file  Stress: Not on file  Social Connections: Not on file     Family History: The patient's ***family history includes Breast cancer (age of onset: 61) in her mother; Cancer (age of onset: 39) in her sister; Hypertension in her father and sister; Lung cancer (age of onset: 59) in her sister; Stomach cancer (age of onset: 77) in her sister. There is no history of Colon cancer.  ROS:   Please see the history of present illness.    *** All other systems reviewed and are negative.  Labs/Other Studies Reviewed:    The following studies were reviewed today:  LHC  08/13/20  Conclusions: Multivessel coronary artery disease, including sequential 60-70% and 20-30% mid LAD stenoses (DFR significant @ 0.85), 40% mid LCx lesion, and chronic total occlusion of mid/distal RCA.  The distal RCA  branches fill via faint left-to-right collaterals. Widely patent mid/distal LCx stent. Severe in-stent restenosis of RCA with 70% proximal disease and chronic total occlusion of the mid and distal segments. Normal left ventricular filling pressure. Successful DFR-guided PCI to mid LAD using Resolute Onyx 2.75 x 26 mm drug eluting stent (postdilated to 3.1 mm) with 10% residual stenosis and TIMI-3 flow.   Recommendations: Dual antiplatelet therapy with aspirin and clopidogrel for up to 1 month (if tolerated) followed by indefinite clopidogrel 75 mg daily. Aggressive secondary prevention of coronary artery disease.   Recent Labs: 09/14/2021: TSH 1.330 02/20/2022: ALT 8 02/23/2022: BUN 19; Creatinine, Ser 1.23; Hemoglobin 12.3; Magnesium 2.2; Platelets 330; Potassium 3.1; Sodium 139  Recent Lipid Panel    Component Value Date/Time   CHOL 181 09/14/2021 1336   TRIG 62 09/14/2021 1336   HDL 70 09/14/2021 1336   CHOLHDL 2.6 09/14/2021 1336   CHOLHDL 2.1 06/16/2018 0642   VLDL 14 06/16/2018 0642   LDLCALC 99 09/14/2021 1336     Risk Assessment/Calculations:   {Does this patient have ATRIAL FIBRILLATION?:[phone]}       Physical Exam:    VS:  LMP 01/04/2015     Wt Readings from Last 3 Encounters:  02/21/22 190 lb 3.2 oz (86.3 kg)  02/08/22 184 lb (83.5 kg)  01/12/22 183 lb (83 kg)     GEN: *** Well nourished, well developed in no acute distress HEENT: Normal NECK: No JVD; No carotid bruits CARDIAC: ***RRR, no murmurs, rubs, gallops RESPIRATORY:  Clear to auscultation without rales, wheezing or rhonchi  ABDOMEN: Soft, non-tender, non-distended MUSCULOSKELETAL:  No edema; No deformity. *** pedal pulses, ***bilaterally SKIN: Warm and dry NEUROLOGIC:   Alert and oriented x 3 PSYCHIATRIC:  Normal affect   EKG:  EKG is *** ordered today.  The ekg ordered today demonstrates ***  Diagnoses:    No diagnosis found. Assessment and Plan:     Preoperative cardiac evaluation:   {Are you ordering a CV Procedure (e.g. stress test, cath, DCCV, TEE, etc)?   Press F2        :[id number]}    Medication Adjustments/Labs and Tests Ordered: Current medicines are reviewed at length with the patient today.  Concerns regarding medicines are outlined above.  No orders of the defined types were placed in this encounter.  No orders of the defined types were placed in this encounter.   There are no Patient Instructions on file for this visit.   Signed, Levi Aland, NP  03/02/2022 6:21 AM    [facility]

## 2022-03-03 NOTE — Addendum Note (Signed)
Addended by: Duayne Cal on: 03/03/2022 11:35 AM   Modules accepted: Orders

## 2022-03-06 ENCOUNTER — Ambulatory Visit (INDEPENDENT_AMBULATORY_CARE_PROVIDER_SITE_OTHER): Payer: Medicaid Other | Admitting: Physician Assistant

## 2022-03-06 ENCOUNTER — Encounter: Payer: Self-pay | Admitting: Physician Assistant

## 2022-03-06 VITALS — BP 110/68 | HR 79 | Ht 60.0 in | Wt 178.0 lb

## 2022-03-06 DIAGNOSIS — N132 Hydronephrosis with renal and ureteral calculous obstruction: Secondary | ICD-10-CM

## 2022-03-06 DIAGNOSIS — I10 Essential (primary) hypertension: Secondary | ICD-10-CM | POA: Diagnosis not present

## 2022-03-06 DIAGNOSIS — Z01818 Encounter for other preprocedural examination: Secondary | ICD-10-CM | POA: Diagnosis not present

## 2022-03-06 DIAGNOSIS — I2510 Atherosclerotic heart disease of native coronary artery without angina pectoris: Secondary | ICD-10-CM

## 2022-03-06 NOTE — Patient Instructions (Addendum)
Medication Instructions:  Your physician recommends that you continue on your current medications as directed. Please refer to the Current Medication list given to you today.  *If you need a refill on your cardiac medications before your next appointment, please call your pharmacy*   Lab Work: None ordered  If you have labs (blood work) drawn today and your tests are completely normal, you will receive your results only by: MyChart Message (if you have MyChart) OR A paper copy in the mail If you have any lab test that is abnormal or we need to change your treatment, we will call you to review the results.   Testing/Procedures: None ordered   Follow-Up: At [facility], you and your health needs are our priority.  As part of our continuing mission to provide you with exceptional heart care, we have created designated Provider Care Teams.  These Care Teams include your primary Cardiologist (physician) and Advanced Practice Providers (APPs -  Physician Assistants and Nurse Practitioners) who all work together to provide you with the care you need, when you need it.  We recommend signing up for the patient portal called "MyChart".  Sign up information is provided on this After Visit Summary.  MyChart is used to connect with patients for Virtual Visits (Telemedicine).  Patients are able to view lab/test results, encounter notes, upcoming appointments, etc.  Non-urgent messages can be sent to your provider as well.   To learn more about what you can do with MyChart, go to [email or web address].    Your next appointment:   6 month(s)  The format for your next appointment:   In Person  Provider:   Lance Muss, MD     Other Instructions If your pain gets worse, please don't wait and go to the Emergency Room.  Important Information About Sugar

## 2022-03-06 NOTE — Progress Notes (Signed)
Cardiology Office Note:    Date:  03/06/2022   ID:  [name] [name], DOB 1965/10/21, MRN [id number]  PCP:  Julieanne Manson, MD  [facility] Cardiologist:  Lance Muss, MD  [facility] Electrophysiologist:  None   Chief Complaint: Surgical clearance for Left Ureteroscopy   History of Present Illness:    [name] [name] is a 56 y.o. female with a hx of CAD, chronic diastolic CHF, CKD IIIa, DM, HTN, tobacco abuse and HLD seen for surgical clearance.   Hx of CAD inf MI 2010 stent RCA, NSTEMI 11/2012 DES Cfx. Cath 07/2020 status post DES to the mid LAD, patent stent to the circumflex, severe in-stent restenosis of the RCA with 70% proximal disease and total occlusion of the mid and distal segment.  Plan was for aspirin and Plavix for 1 month followed by indefinite clopidogrel 75 mg daily.  He was given okay to hold Plavix for 5 days for colonoscopy and EGD in 08/2021 & facet injection.  Admitted 01/2022 for sepsis 2nd to infected and obstructing left ureteral calculus/left hydronephrosis and hydroureter with urinary obstruction. Patient underwent cystoscopy and left ureteral stent placement by Dr. Cyndie Chime on 02/19/2022.  Now plan for left ureteroscopy with stent extraction. Here for clearance. She is in severe L sided flank pain. Rated 6/10 pain. Got tramadol prior to coming here. Getting abx of UTI. Having hematuria.  Denies chest pain, shortness of breath, orthopnea, PND, syncope, lower extremity edema or melena.  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)     Daily headache    not daily   Depression    Bipolar disorder   Diabetic foot ulcer associated with diabetes mellitus due to underlying condition (HCC) 09/26/2018   Diabetic gastroparesis associated with type 2 diabetes mellitus (HCC)    Diabetic peripheral neuropathy associated with type 2 diabetes mellitus (HCC)    Diabetic retinopathy    Esophageal stenosis    Esophageal ulcer    Esophagitis    Gastroparesis    Genital herpes    Reportedly tested and documented by [facility]--rare occurrences   GERD (gastroesophageal reflux disease)    Heart murmur    History of stomach ulcers    Hyperlipidemia    Hypertension    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   [name] on 12/19/2012, "that's the only one I've had" (12/19/2012)   Migraines    Onychomycosis 02/07/2021   Orthopnoea    Paronychia of finger, left 02/07/2021   Pneumonia 2012   Polysubstance abuse (HCC)    Crack cocaine--none since 2008, MJ, ETOH:  clean of all since 2008   Renal insufficiency    Renal lithiasis    Bilateral   Rheumatoid arthritis(714.0)    Sebaceous cyst    Stroke [facility]) 2011   denies residual on 12/19/2012.  "Years ago"   Type II diabetes mellitus (HCC)    Previously uncontrolled for many years with multiple complications.  2017 controlled. 05/2016:  6.7%  Vitamin D deficiency     Past Surgical History:  Procedure Laterality Date   CORONARY ANGIOPLASTY WITH STENT PLACEMENT  01/20/2009   "2" (12/19/2012)   CORONARY ANGIOPLASTY WITH STENT PLACEMENT  2012   "2" (12/19/2012)   CORONARY ANGIOPLASTY WITH STENT PLACEMENT  12/19/2012   "2" (12/19/2012)   CORONARY STENT INTERVENTION N/A 08/13/2020   Procedure: CORONARY STENT INTERVENTION;  Surgeon: Nelva Bush, MD;  Location: [place] CV [facility];  Service: Cardiovascular;  Laterality: N/A;   CYSTOSCOPY W/ URETERAL STENT PLACEMENT Left 02/19/2022   Procedure: CYSTOSCOPY WITH URETERAL STENT PLACEMENT;  Surgeon: Irine Seal, MD;  Location: [facility];  Service: Urology;   Laterality: Left;   ESOPHAGOGASTRODUODENOSCOPY N/A 02/26/2015   Procedure: ESOPHAGOGASTRODUODENOSCOPY (EGD);  Surgeon: Teena Irani, MD;  Location: [facility] ENDOSCOPY;  Service: Endoscopy;  Laterality: N/A;   EYE SURGERY     Multiple surgeries of both eyes:  last laser was 09/08/2015 of right eye.  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   GAS INSERTION Left 07/16/2013   Procedure: INSERTION OF GAS;  Surgeon: Adonis Brook, MD;  Location: [place];  Service: Ophthalmology;  Laterality: Left;  SF6   GAS/FLUID EXCHANGE Left 07/30/2013   Procedure: GAS/FLUID EXCHANGE;  Surgeon: Adonis Brook, MD;  Location: [place];  Service: Ophthalmology;  Laterality: Left;   INTRAVASCULAR PRESSURE WIRE/FFR STUDY N/A 08/13/2020   Procedure: INTRAVASCULAR PRESSURE WIRE/FFR STUDY;  Surgeon: Nelva Bush, MD;  Location: [place] [facility];  Service: Cardiovascular;  Laterality: N/A;   IRRIGATION AND DEBRIDEMENT SEBACEOUS CYST Right 03/2011   "pointer" (12/19/2012)   LEFT HEART CATH AND CORONARY ANGIOGRAPHY N/A 08/13/2020   Procedure: LEFT HEART CATH AND CORONARY ANGIOGRAPHY;  Surgeon: Nelva Bush, MD;  Location: [place] [facility];  Service: Cardiovascular;  Laterality: N/A;   LEFT HEART CATHETERIZATION WITH CORONARY ANGIOGRAM N/A 07/06/2011   Procedure: LEFT HEART CATHETERIZATION WITH CORONARY ANGIOGRAM;  Surgeon: Jettie Booze, MD;  Location: [facility] CATH LAB;  Service: Cardiovascular;  Laterality: N/A;  possible PCI   LEFT HEART CATHETERIZATION WITH CORONARY ANGIOGRAM N/A 12/19/2012   Procedure: LEFT HEART CATHETERIZATION WITH CORONARY ANGIOGRAM;  Surgeon: Jettie Booze, MD;  Location: [facility] CATH LAB;  Service: Cardiovascular;  Laterality: N/A;   MEMBRANE PEEL Left 07/16/2013   Procedure: MEMBRANE PEEL;  Surgeon: Adonis Brook, MD;  Location: [place];  Service: Ophthalmology;  Laterality: Left;   PARS PLANA VITRECTOMY Left 07/16/2013   Procedure: PARS PLANA VITRECTOMY WITH 23 GAUGE;  Surgeon: Adonis Brook, MD;  Location: [place];  Service: Ophthalmology;  Laterality: Left;   PARS PLANA VITRECTOMY Left 07/30/2013   Procedure: PARS PLANA VITRECTOMY WITH 23 GAUGE WITH ENDOLASER;  Surgeon: Adonis Brook, MD;  Location: [place];  Service: Ophthalmology;  Laterality: Left;  with endolaser   PERCUTANEOUS CORONARY STENT INTERVENTION (PCI-S) N/A 07/06/2011   Procedure: PERCUTANEOUS CORONARY STENT INTERVENTION (PCI-S);  Surgeon: Jettie Booze, MD;  Location: [facility] CATH LAB;  Service: Cardiovascular;  Laterality: N/A;   PHOTOCOAGULATION WITH LASER Left 07/16/2013   Procedure: PHOTOCOAGULATION WITH LASER;  Surgeon: Adonis Brook, MD;  Location: [place];  Service: Ophthalmology;  Laterality: Left;  ENDOLASER    Current Medications: Current Meds  Medication Sig   acetaminophen (TYLENOL) 325 MG tablet Take 2 tablets (650 mg total) by mouth every 6 (six) hours as needed for mild pain or fever.   albuterol (PROVENTIL HFA;VENTOLIN HFA) 108 (90 Base) MCG/ACT inhaler Inhale 2 puffs into the lungs every 6 (six) hours as  needed for wheezing or shortness of breath.   ammonium lactate (AMLACTIN) 12 % cream Apply topically as needed for dry skin.   clopidogrel (PLAVIX) 75 MG tablet TAKE 1 TABLET BY MOUTH EVERY DAY   cyclobenzaprine (FLEXERIL) 10 MG tablet Take 1-2 tablets (10-20 mg total) by mouth at bedtime as needed for muscle spasms. Muscle spasms   famotidine (PEPCID) 20 MG tablet Take 20 mg by mouth 2 (two) times daily.   folic acid (FOLVITE) 1 MG tablet Take 1 mg by mouth daily.   furosemide (LASIX) 40 MG tablet TAKE 1 TABLET BY MOUTH EVERY DAY IN THE MORNING   hydrOXYzine (ATARAX) 25 MG tablet Take 25 mg by mouth 2 (two) times daily.   insulin aspart (NOVOLOG) 100 UNIT/ML injection Inject 4-16 Units into the skin 3 (three) times daily with meals. Per Sliding scale CBG 100-150: 4 units, 151-200: 6 units, 201-250: 8 units, 251-300: 10 units, 301-350: 12 units, 351-400: 14 units, > 401; call doctor   LANTUS  SOLOSTAR 100 UNIT/ML Solostar Pen Inject 12 Units into the skin 2 (two) times daily.   methotrexate (RHEUMATREX) 2.5 MG tablet Take 10 mg by mouth every Wednesday.   metoprolol tartrate (LOPRESSOR) 25 MG tablet TAKE 1 TABLET BY MOUTH TWICE A DAY (Patient taking differently: Take 25 mg by mouth 2 (two) times daily.)   nitroGLYCERIN (NITROSTAT) 0.4 MG SL tablet PLACE 1 TABLET UNDER THE TONGUE EVERY 5 MINUTES AS NEEDED FOR CHEST PAIN. MAX 3 DOSES, CALL 911.   ondansetron (ZOFRAN) 4 MG tablet 1 tab by mouth every 6 hours as needed for nausea and vomiting.   pantoprazole (PROTONIX) 40 MG tablet Take 40 mg by mouth daily.   polyethylene glycol (MIRALAX / GLYCOLAX) 17 g packet Take 17 g by mouth daily.   prednisoLONE acetate (PRED FORTE) 1 % ophthalmic suspension Place 4-5 drops into the left eye daily.   rosuvastatin (CRESTOR) 40 MG tablet TAKE 1 TABLET BY MOUTH EVERY DAY   senna-docusate (SENOKOT-S) 8.6-50 MG tablet Take 1 tablet by mouth at bedtime as needed for mild constipation or moderate constipation.   sertraline (ZOLOFT) 100 MG tablet Take 1.5 tablets (150 mg total) by mouth daily.   traMADol (ULTRAM) 50 MG tablet TAKE 1 TABLET EVERY 6 HOURS AS NEEDED FOR MODERATE PAIN     Allergies:   Aspirin   Social History   Socioeconomic History   Marital status: Divorced    Spouse name: Not on file   Number of children: 1   Years of education: 12   Highest education level: [employer] school graduate  Occupational History   Occupation: Disabled   Tobacco Use   Smoking status: Every Day    Packs/day: 0.50    Years: 22.00    Total pack years: 11.00    Types: Cigarettes   Smokeless tobacco: Never   Tobacco comments:    Has been to classes--not sure if she wants to quit.  Changing to non menthol..  Chantix, patches, gum never helped.  9/19:  lot going on.  Vaping Use   Vaping Use: Never used  Substance and Sexual Activity   Alcohol use: Yes    Alcohol/week: 0.0 standard drinks of alcohol     Comment: Extremely rare--once yearly or less.   Drug use: Not Currently    Types: "Crack" cocaine, Marijuana    Comment: 12/31/2012 " clean from crack.  Does still smoke marijuana    Sexual activity: Not Currently    Partners: Male  Birth control/protection: None  Other Topics Concern   Not on file  Social History Narrative   Has lived in [place] for most of life   Disabled   Previously went to [employer] school and worked as Conservation officer, nature.   Lives by herself near [place].   Divorced in 2015.    She is caring for a middle school aged girl, Arvilla Meres, while her father goes through rehab.     Social Determinants of Health   Financial Resource Strain: Low Risk  (09/03/2020)   Overall Financial Resource Strain (CARDIA)    Difficulty of Paying Living Expenses: Not very hard  Food Insecurity: No Food Insecurity (09/03/2020)   Hunger Vital Sign    Worried About Running Out of Food in the Last Year: Never true    Ran Out of Food in the Last Year: Never true  Transportation Needs: No Transportation Needs (09/03/2020)   PRAPARE - Administrator, Civil Service (Medical): No    Lack of Transportation (Non-Medical): No  Physical Activity: Not on file  Stress: Not on file  Social Connections: Not on file     Family History: The patient's family history includes Breast cancer (age of onset: 49) in her mother; Cancer (age of onset: 39) in her sister; Hypertension in her father and sister; Lung cancer (age of onset: 63) in her sister; Stomach cancer (age of onset: 93) in her sister. There is no history of Colon cancer.    ROS:   Please see the history of present illness.    All other systems reviewed and are negative.   EKGs/Labs/Other Studies Reviewed:    The following studies were reviewed today:  CORONARY STENT INTERVENTION  INTRAVASCULAR PRESSURE WIRE/FFR STUDY  LEFT HEART CATH AND CORONARY ANGIOGRAPHY   Conclusion  Conclusions: Multivessel coronary artery disease,  including sequential 60-70% and 20-30% mid LAD stenoses (DFR significant @ 0.85), 40% mid LCx lesion, and chronic total occlusion of mid/distal RCA.  The distal RCA branches fill via faint left-to-right collaterals. Widely patent mid/distal LCx stent. Severe in-stent restenosis of RCA with 70% proximal disease and chronic total occlusion of the mid and distal segments. Normal left ventricular filling pressure. Successful DFR-guided PCI to mid LAD using Resolute Onyx 2.75 x 26 mm drug eluting stent (postdilated to 3.1 mm) with 10% residual stenosis and TIMI-3 flow.   Recommendations: Dual antiplatelet therapy with aspirin and clopidogrel for up to 1 month (if tolerated) followed by indefinite clopidogrel 75 mg daily. Aggressive secondary prevention of coronary artery disease.    EKG:  EKG is  ordered today.  The ekg ordered today demonstrates normal sinus rhythm  Recent Labs: 09/14/2021: TSH 1.330 02/20/2022: ALT 8 02/23/2022: BUN 19; Creatinine, Ser 1.23; Hemoglobin 12.3; Magnesium 2.2; Platelets 330; Potassium 3.1; Sodium 139  Recent Lipid Panel    Component Value Date/Time   CHOL 181 09/14/2021 1336   TRIG 62 09/14/2021 1336   HDL 70 09/14/2021 1336   CHOLHDL 2.6 09/14/2021 1336   CHOLHDL 2.1 06/16/2018 0642   VLDL 14 06/16/2018 0642   LDLCALC 99 09/14/2021 1336    Physical Exam:    VS:  BP 110/68   Pulse 79   Ht 5' (1.524 m)   Wt 178 lb (80.7 kg)   LMP 01/04/2015   BMI 34.76 kg/m     Wt Readings from Last 3 Encounters:  03/06/22 178 lb (80.7 kg)  02/21/22 190 lb 3.2 oz (86.3 kg)  02/08/22 184 lb (83.5 kg)  GEN:  Well nourished, well developed in no acute distress HEENT: Normal NECK: No JVD; No carotid bruits LYMPHATICS: No lymphadenopathy CARDIAC: RRR, no murmurs, rubs, gallops RESPIRATORY:  Clear to auscultation without rales, wheezing or rhonchi  ABDOMEN: Soft, LUQ and L flank pain  MUSCULOSKELETAL:  No edema; No deformity  SKIN: Warm and dry NEUROLOGIC:   Alert and oriented x 3 PSYCHIATRIC:  Normal affect   ASSESSMENT AND PLAN:    CAD -No angina. Continue Plavix, BB and statin.   2. HTN - BP stable on current medications  3. left ureteral calculus  4. L flank pain Patient is in severe pain during office visit.  She does not wanted to go to ER.  She has follow-up with urologist tomorrow.  Recommended to go to ER if worsening symptoms.  No fever or chills.  5.  Surgical clearance According to the Revised Cardiac Risk Index (RCRI), her Perioperative Risk of Major Cardiac Event is (%): 6.6. getting > 4 mets of activity.  Given past medical history and time since last visit, based on ACC/AHA guidelines, Eulah Colombo would be at acceptable risk for the planned procedure without further cardiovascular testing. She can hold per Plavix for 5 days prior.   The patient was advised that if she develops new symptoms prior to surgery to contact our office to arrange for a follow-up visit, and she verbalized understanding.  I will route this recommendation to the requesting party via Epic fax function and remove from pre-op pool.  Please call with questions.   Medication Adjustments/Labs and Tests Ordered: Current medicines are reviewed at length with the patient today.  Concerns regarding medicines are outlined above.  Orders Placed This Encounter  Procedures   EKG 12-Lead   No orders of the defined types were placed in this encounter.   Patient Instructions  Medication Instructions:  Your physician recommends that you continue on your current medications as directed. Please refer to the Current Medication list given to you today.  *If you need a refill on your cardiac medications before your next appointment, please call your pharmacy*   Lab Work: None ordered  If you have labs (blood work) drawn today and your tests are completely normal, you will receive your results only by: [place] (if you have MyChart) OR A paper copy in the  mail If you have any lab test that is abnormal or we need to change your treatment, we will call you to review the results.   Testing/Procedures: None ordered   Follow-Up: At [facility], you and your health needs are our priority.  As part of our continuing mission to provide you with exceptional heart care, we have created designated Provider Care Teams.  These Care Teams include your primary Cardiologist (physician) and Advanced Practice Providers (APPs -  Physician Assistants and Nurse Practitioners) who all work together to provide you with the care you need, when you need it.  We recommend signing up for the patient portal called "MyChart".  Sign up information is provided on this After Visit Summary.  MyChart is used to connect with patients for Virtual Visits (Telemedicine).  Patients are able to view lab/test results, encounter notes, upcoming appointments, etc.  Non-urgent messages can be sent to your provider as well.   To learn more about what you can do with MyChart, go to [email or web address].    Your next appointment:   6 month(s)  The format for your next appointment:   In Person  Provider:  Larae Grooms, MD     Other Instructions If your pain gets worse, please don't wait and go to the Emergency Room.  Important Information About Sugar         Jarrett Soho, PA  03/06/2022 3:11 PM    [facility]

## 2022-03-08 NOTE — Patient Instructions (Signed)
DUE TO COVID-19 ONLY TWO VISITORS  (aged 55 and older)  ARE ALLOWED TO COME WITH YOU AND STAY IN THE WAITING ROOM ONLY DURING PRE OP AND PROCEDURE.   **NO VISITORS ARE ALLOWED IN THE SHORT STAY AREA OR RECOVERY ROOM!!**  IF YOU WILL BE ADMITTED INTO THE HOSPITAL YOU ARE ALLOWED ONLY FOUR SUPPORT PEOPLE DURING VISITATION HOURS ONLY (7 AM -8PM)   The support person(s) must pass our screening, gel in and out, and wear a mask at all times, including in the patient's room. Patients must also wear a mask when staff or their support person are in the room. Visitors GUEST BADGE MUST BE WORN VISIBLY  One adult visitor may remain with you overnight and MUST be in the room by 8 P.M.     Your procedure is scheduled on: 03/14/22   Report to [facility] Main Entrance    Report to admitting at: 6:45 AM   Call this number if you have problems the morning of surgery [phone]   Do not eat food :After Midnight.   After Midnight you may have the following liquids until : 6:00 AM DAY OF SURGERY  Water Black Coffee (sugar ok, NO MILK/CREAM OR CREAMERS)  Tea (sugar ok, NO MILK/CREAM OR CREAMERS) regular and decaf                             Plain Jell-O (NO RED)                                           Fruit ices (not with fruit pulp, NO RED)                                     Popsicles (NO RED)                                                                  Juice: apple, WHITE grape, WHITE cranberry Sports drinks like Gatorade (NO RED)             FOLLOW BOWEL PREP AND ANY ADDITIONAL PRE OP INSTRUCTIONS YOU RECEIVED FROM YOUR SURGEON'S OFFICE!!!    Oral Hygiene is also important to reduce your risk of infection.                                    Remember - BRUSH YOUR TEETH THE MORNING OF SURGERY WITH YOUR REGULAR TOOTHPASTE   Do NOT smoke after Midnight   Take these medicines the morning of surgery with A SIP OF WATER: hydroxyzine,sertraline,metoprolol,famotidine,pantoprazole.Use  inhalers as usual.Tylenol as needed.  How to Manage Your Diabetes Before and After Surgery  Why is it important to control my blood sugar before and after surgery? Improving blood sugar levels before and after surgery helps healing and can limit problems. A way of improving blood sugar control is eating a healthy diet by:  Eating less sugar and carbohydrates  Increasing activity/exercise  Talking with your  doctor about reaching your blood sugar goals [place] blood sugars (greater than 180 mg/dL) can raise your risk of infections and slow your recovery, so you will need to focus on controlling your diabetes during the weeks before surgery. Make sure that the doctor who takes care of your diabetes knows about your planned surgery including the date and location.  How do I manage my blood sugar before surgery? Check your blood sugar at least 4 times a day, starting 2 days before surgery, to make sure that the level is not too [place] or low. Check your blood sugar the morning of your surgery when you wake up and every 2 hours until you get to the Short Stay unit. If your blood sugar is less than 70 mg/dL, you will need to treat for low blood sugar: Do not take insulin. Treat a low blood sugar (less than 70 mg/dL) with  cup of clear juice (cranberry or apple), 4 glucose tablets, OR glucose gel. Recheck blood sugar in 15 minutes after treatment (to make sure it is greater than 70 mg/dL). If your blood sugar is not greater than 70 mg/dL on recheck, call [phone] for further instructions. Report your blood sugar to the short stay nurse when you get to Short Stay.  If you are admitted to the hospital after surgery: Your blood sugar will be checked by the staff and you will probably be given insulin after surgery (instead of oral diabetes medicines) to make sure you have good blood sugar levels. The goal for blood sugar control after surgery is 80-180 mg/dL.   WHAT DO I DO ABOUT MY DIABETES  MEDICATION?  Do not take oral diabetes medicines (pills) the morning of surgery.  THE NIGHT BEFORE SURGERY, take ONLY half of the lantus insulin dose. DO NOT take the evening dose of novolog.      THE MORNING OF SURGERY, take ONLY half of the lantus insulin dose.  If your CBG is greater than 220 mg/dL, you may take  of your sliding scale  (correction) dose of insulin.  DO NOT TAKE ANY ORAL DIABETIC MEDICATIONS DAY OF YOUR SURGERY  Bring CPAP mask and tubing day of surgery.                              You may not have any metal on your body including hair pins, jewelry, and body piercing             Do not wear make-up, lotions, powders, perfumes/cologne, or deodorant  Do not wear nail polish including gel and S&S, artificial/acrylic nails, or any other type of covering on natural nails including finger and toenails. If you have artificial nails, gel coating, etc. that needs to be removed by a nail salon please have this removed prior to surgery or surgery may need to be canceled/ delayed if the surgeon/ anesthesia feels like they are unable to be safely monitored.   Do not shave  48 hours prior to surgery.    Do not bring valuables to the hospital. [facility] IS NOT             RESPONSIBLE   FOR VALUABLES.   Contacts, dentures or bridgework may not be worn into surgery.   Bring small overnight bag day of surgery.   DO NOT BRING YOUR HOME MEDICATIONS TO THE HOSPITAL. PHARMACY WILL DISPENSE MEDICATIONS LISTED ON YOUR MEDICATION LIST TO YOU DURING YOUR ADMISSION IN THE  HOSPITAL!    Patients discharged on the day of surgery will not be allowed to drive home.  Someone NEEDS to stay with you for the first 24 hours after anesthesia.   Special Instructions: Bring a copy of your healthcare power of attorney and living will documents         the day of surgery if you haven't scanned them before.              Please read over the following fact sheets you were given: IF YOU HAVE  QUESTIONS ABOUT YOUR PRE-OP INSTRUCTIONS PLEASE CALL [phone]     [facility] - Preparing for Surgery Before surgery, you can play an important role.  Because skin is not sterile, your skin needs to be as free of germs as possible.  You can reduce the number of germs on your skin by washing with CHG (chlorahexidine gluconate) soap before surgery.  CHG is an antiseptic cleaner which kills germs and bonds with the skin to continue killing germs even after washing. Please DO NOT use if you have an allergy to CHG or antibacterial soaps.  If your skin becomes reddened/irritated stop using the CHG and inform your nurse when you arrive at Short Stay. Do not shave (including legs and underarms) for at least 48 hours prior to the first CHG shower.  You may shave your face/neck. Please follow these instructions carefully:  1.  Shower with CHG Soap the night before surgery and the  morning of Surgery.  2.  If you choose to wash your hair, wash your hair first as usual with your  normal  shampoo.  3.  After you shampoo, rinse your hair and body thoroughly to remove the  shampoo.                           4.  Use CHG as you would any other liquid soap.  You can apply chg directly  to the skin and wash                       Gently with a scrungie or clean washcloth.  5.  Apply the CHG Soap to your body ONLY FROM THE NECK DOWN.   Do not use on face/ open                           Wound or open sores. Avoid contact with eyes, ears mouth and genitals (private parts).                       Wash face,  Genitals (private parts) with your normal soap.             6.  Wash thoroughly, paying special attention to the area where your surgery  will be performed.  7.  Thoroughly rinse your body with warm water from the neck down.  8.  DO NOT shower/wash with your normal soap after using and rinsing off  the CHG Soap.                9.  Pat yourself dry with a clean towel.            10.  Wear clean pajamas.             11.  Place clean sheets on your bed the night of your first shower and do  not  sleep with pets. Day of Surgery : Do not apply any lotions/deodorants the morning of surgery.  Please wear clean clothes to the hospital/surgery center.  FAILURE TO FOLLOW THESE INSTRUCTIONS MAY RESULT IN THE CANCELLATION OF YOUR SURGERY PATIENT SIGNATURE_________________________________  NURSE SIGNATURE__________________________________  ________________________________________________________________________

## 2022-03-09 ENCOUNTER — Encounter (HOSPITAL_COMMUNITY): Payer: Self-pay

## 2022-03-09 ENCOUNTER — Telehealth: Payer: Self-pay | Admitting: Interventional Cardiology

## 2022-03-09 ENCOUNTER — Other Ambulatory Visit: Payer: Self-pay

## 2022-03-09 ENCOUNTER — Encounter (HOSPITAL_COMMUNITY)
Admission: RE | Admit: 2022-03-09 | Discharge: 2022-03-09 | Disposition: A | Discharge status: Home / Self Care | Payer: Medicaid Other | Source: Ambulatory Visit | Attending: Urology | Admitting: Urology

## 2022-03-09 VITALS — BP 126/64 | HR 70 | Temp 98.2°F | Ht 60.0 in | Wt 176.0 lb

## 2022-03-09 DIAGNOSIS — M069 Rheumatoid arthritis, unspecified: Secondary | ICD-10-CM | POA: Diagnosis not present

## 2022-03-09 DIAGNOSIS — Z955 Presence of coronary angioplasty implant and graft: Secondary | ICD-10-CM | POA: Insufficient documentation

## 2022-03-09 DIAGNOSIS — E119 Type 2 diabetes mellitus without complications: Secondary | ICD-10-CM

## 2022-03-09 DIAGNOSIS — N201 Calculus of ureter: Secondary | ICD-10-CM | POA: Insufficient documentation

## 2022-03-09 DIAGNOSIS — Z8673 Personal history of transient ischemic attack (TIA), and cerebral infarction without residual deficits: Secondary | ICD-10-CM | POA: Diagnosis not present

## 2022-03-09 DIAGNOSIS — N181 Chronic kidney disease, stage 1: Secondary | ICD-10-CM | POA: Diagnosis not present

## 2022-03-09 DIAGNOSIS — E1159 Type 2 diabetes mellitus with other circulatory complications: Secondary | ICD-10-CM

## 2022-03-09 DIAGNOSIS — Z7982 Long term (current) use of aspirin: Secondary | ICD-10-CM | POA: Insufficient documentation

## 2022-03-09 DIAGNOSIS — I5032 Chronic diastolic (congestive) heart failure: Secondary | ICD-10-CM | POA: Insufficient documentation

## 2022-03-09 DIAGNOSIS — Z7902 Long term (current) use of antithrombotics/antiplatelets: Secondary | ICD-10-CM | POA: Diagnosis not present

## 2022-03-09 DIAGNOSIS — F17210 Nicotine dependence, cigarettes, uncomplicated: Secondary | ICD-10-CM | POA: Diagnosis not present

## 2022-03-09 DIAGNOSIS — Z794 Long term (current) use of insulin: Secondary | ICD-10-CM | POA: Insufficient documentation

## 2022-03-09 DIAGNOSIS — Z01812 Encounter for preprocedural laboratory examination: Secondary | ICD-10-CM | POA: Insufficient documentation

## 2022-03-09 DIAGNOSIS — E1122 Type 2 diabetes mellitus with diabetic chronic kidney disease: Secondary | ICD-10-CM | POA: Diagnosis not present

## 2022-03-09 DIAGNOSIS — I2510 Atherosclerotic heart disease of native coronary artery without angina pectoris: Secondary | ICD-10-CM | POA: Diagnosis not present

## 2022-03-09 DIAGNOSIS — I130 Hypertensive heart and chronic kidney disease with heart failure and stage 1 through stage 4 chronic kidney disease, or unspecified chronic kidney disease: Secondary | ICD-10-CM | POA: Diagnosis not present

## 2022-03-09 HISTORY — DX: Unspecified osteoarthritis, unspecified site: M19.90

## 2022-03-09 LAB — GLUCOSE, CAPILLARY: Glucose-Capillary: 67 mg/dL — ABNORMAL LOW (ref 70–99)

## 2022-03-09 NOTE — Progress Notes (Signed)
For Short Stay: COVID SWAB appointment date: Date of COVID positive in last 90 days:  Bowel Prep reminder:   For Anesthesia: PCP - Dr. Julieanne Manson. Cardiologist - Dr. Lance Muss. LOV: 03/06/22 : Clearance.: EPIC  Chest x-ray - 02/19/22 EKG - 03/06/22 Stress Test -  ECHO - 08/12/20 Cardiac Cath - 08/13/20 Pacemaker/ICD device last checked: Pacemaker orders received: Device Rep notified:  Spinal Cord Stimulator:  Sleep Study -  CPAP -   Fasting Blood Sugar -  Checks Blood Sugar _____ times a day Date and result of last Hgb A1c-7.2: 02/20/22  Blood Thinner Instructions: Pt. Will check with cardiologist about Plavix instructions. Aspirin Instructions: Last Dose:  Activity level: Can go up a flight of stairs and activities of daily living without stopping and without chest pain and/or shortness of breath   Able to exercise without chest pain and/or shortness of breath   Unable to go up a flight of stairs without chest pain and/or shortness of breath     Anesthesia review:Hx: DIA,CAD,Smoker,Stroke,CKD 1,Heart murmur,HTN   Patient denies shortness of breath, fever, cough and chest pain at PAT appointment   Patient verbalized understanding of instructions that were given to them at the PAT appointment. Patient was also instructed that they will need to review over the PAT instructions again at home before surgery.

## 2022-03-09 NOTE — Telephone Encounter (Signed)
I s/w the pt and went over the recommendations from [facility], PAC ok to hold Plavix x 5 days. Pt states she took her Plavix yesterday but not yet today. I advised the pt to hold her Plavix as of today. Advised she will need to discuss with the surgeon doinig her procedure when it will be safe to resume s/p procedure. Pt verbalized understanding to plan of care and thanked me for the call back.

## 2022-03-09 NOTE — Telephone Encounter (Signed)
Patient wants to know when she is suppose to stop taking her Plavix prior to her surgery on August 15, 23

## 2022-03-10 NOTE — Anesthesia Preprocedure Evaluation (Signed)
Anesthesia Evaluation  Patient identified by MRN, date of birth, ID band Patient awake    Reviewed: Allergy & Precautions, NPO status , Patient's Chart, lab work & pertinent test results  Airway Mallampati: II  TM Distance: >3 FB Neck ROM: Full    Dental  (+) Dental Advisory Given, Edentulous Upper, Partial Lower, Missing, Poor Dentition   Pulmonary pneumonia, Current Smoker and Patient abstained from smoking.,    Pulmonary exam normal breath sounds clear to auscultation       Cardiovascular hypertension, + angina + CAD, + Past MI, + Cardiac Stents, +CHF and + Orthopnea  + Valvular Problems/Murmurs  Rhythm:Regular Rate:Normal  Echo 05/2021 1. Left ventricular ejection fraction, by estimation, is 55 to 60%. The left ventricle has normal function. The left ventricle has no regional wall motion abnormalities. Left ventricular diastolic parameters were normal.  2. Right ventricular systolic function is normal. The right ventricular size is mildly enlarged. Tricuspid regurgitation signal is inadequate for assessing PA pressure.  3. Left atrial size was mildly dilated.  4. Right atrial size was mildly dilated.  5. The mitral valve is normal in structure. Trivial mitral valve regurgitation.  6. The aortic valve is tricuspid. Aortic valve regurgitation is not visualized. No aortic stenosis is present.  7. The inferior vena cava is normal in size with <50% respiratory variability, suggesting right atrial pressure of 8 mmHg.    LHC 07/2020 1. Multivessel coronary artery disease, including sequential 60-70% and 20-30% mid LAD stenoses (DFR significant @ 0.85), 40% mid LCx lesion, and chronic total occlusion of mid/distal RCA.  The distal RCA branches fill via faint left-to-right collaterals. 2. Widely patent mid/distal LCx stent. 3. Severe in-stent restenosis of RCA with 70% proximal disease and chronic total occlusion of the mid and distal  segments. 4. Normal left ventricular filling pressure. 5. Successful DFR-guided PCI to mid LAD using Resolute Onyx 2.75 x 26 mm drug eluting stent (postdilated to 3.1 mm) with 10% residual stenosis and TIMI-3 flow.  Recommendations: 1. Dual antiplatelet therapy with aspirin and clopidogrel for up to 1 month (if tolerated) followed by indefinite clopidogrel 75 mg daily. 2. Aggressive secondary prevention of coronary artery disease.    Neuro/Psych  Headaches, PSYCHIATRIC DISORDERS Anxiety Depression Bipolar Disorder CVA, No Residual Symptoms    GI/Hepatic Neg liver ROS, PUD, GERD  ,  Endo/Other  negative endocrine ROSdiabetes  Renal/GU Renal disease     Musculoskeletal  (+) Arthritis , Fibromyalgia -  Abdominal (+) + obese,   Peds  Hematology  (+) Blood dyscrasia, anemia ,   Anesthesia Other Findings   Reproductive/Obstetrics                                                            Anesthesia Evaluation  Patient identified by MRN, date of birth, ID band Patient awake    Reviewed: Allergy & Precautions, NPO status , Patient's Chart, lab work & pertinent test results  Airway Mallampati: II  TM Distance: >3 FB Neck ROM: Full    Dental  (+) Upper Dentures   Pulmonary neg pulmonary ROS, Current Smoker,    Pulmonary exam normal        Cardiovascular hypertension, + CAD, + Past MI (2010), + Cardiac Stents (on Plavix) and +CHF   Rhythm:Regular Rate:Normal  Neuro/Psych  Headaches, Anxiety Depression Bipolar Disorder CVA (2014), No Residual Symptoms    GI/Hepatic Neg liver ROS, PUD, GERD  ,  Endo/Other  diabetes, Type 2, Insulin Dependent  Renal/GU ARF and CRFRenal diseaseInfected renal stone with sepsis     Musculoskeletal  (+) Arthritis , Fibromyalgia -  Abdominal Normal abdominal exam  (+)   Peds  Hematology  (+) Blood dyscrasia, anemia ,   Anesthesia Other Findings   Reproductive/Obstetrics                              Anesthesia Physical Anesthesia Plan  ASA: 3 and emergent  Anesthesia Plan: General   Post-op Pain Management:    Induction: Intravenous  PONV Risk Score and Plan: 2 and Ondansetron, Dexamethasone and Treatment may vary due to age or medical condition  Airway Management Planned: Mask and LMA  Additional Equipment: None  Intra-op Plan:   Post-operative Plan: Extubation in OR  Informed Consent: I have reviewed the patients History and Physical, chart, labs and discussed the procedure including the risks, benefits and alternatives for the proposed anesthesia with the patient or authorized representative who has indicated his/her understanding and acceptance.     Dental advisory given  Plan Discussed with: CRNA  Anesthesia Plan Comments: (Lab Results      Component                Value               Date                      WBC                      25.6 (H)            02/19/2022                HGB                      15.0                02/19/2022                HCT                      46.5 (H)            02/19/2022                MCV                      92.1                02/19/2022                PLT                      380                 02/19/2022           Lab Results      Component                Value               Date  NA                       141                 02/19/2022                K                        3.3 (L)             02/19/2022                CO2                      26                  02/19/2022                GLUCOSE                  106 (H)             02/19/2022                BUN                      18                  02/19/2022                CREATININE               1.54 (H)            02/19/2022                CALCIUM                  9.6                 02/19/2022                EGFR                     58 (L)              09/14/2021                GFRNONAA                  40 (L)              02/19/2022            ECHO: 1. Left ventricular ejection fraction, by estimation, is 55 to 60%. The  left ventricle has normal function. The left ventricle has no regional  wall motion abnormalities. Left ventricular diastolic parameters were  normal.  2. Right ventricular systolic function is normal. The right ventricular  size is mildly enlarged. Tricuspid regurgitation signal is inadequate for  assessing PA pressure.  3. Left atrial size was mildly dilated.  4. Right atrial size was mildly dilated.  5. The mitral valve is normal in structure. Trivial mitral valve  regurgitation.  6. The aortic valve is tricuspid. Aortic valve regurgitation is not  visualized. No aortic stenosis is present.  7. The inferior vena cava is normal in size with <50% respiratory  variability, suggesting right atrial pressure of 8 mmHg. )  Anesthesia Quick Evaluation  Anesthesia Physical Anesthesia Plan  ASA: 3  Anesthesia Plan: General   Post-op Pain Management: Minimal or no pain anticipated   Induction: Intravenous  PONV Risk Score and Plan: 3 and Ondansetron, Dexamethasone, Treatment may vary due to age or medical condition and Midazolam  Airway Management Planned: LMA  Additional Equipment: None  Intra-op Plan:   Post-operative Plan: Extubation in OR  Informed Consent: I have reviewed the patients History and Physical, chart, labs and discussed the procedure including the risks, benefits and alternatives for the proposed anesthesia with the patient or authorized representative who has indicated his/her understanding and acceptance.     Dental advisory given  Plan Discussed with: CRNA  Anesthesia Plan Comments: (See APP note by Durel Salts, FNP )      Anesthesia Quick Evaluation

## 2022-03-10 NOTE — Progress Notes (Signed)
Anesthesia Chart Review:   Case: [id number] Date/Time: 03/14/22 0845   Procedure: CYSTOSCOPY LEFT URETEROSCOPY/HOLMIUM LASER/STENT EXCHANGE (Left) - 1 HR FOR CASE   Anesthesia type: General   Pre-op diagnosis: LEFT  URETERAL VESSICAL JUNCTION STONE   Location: [place] [facility]   Surgeons: Irine Seal, MD       DISCUSSION: Pt is 56 years old with hx CAD (s/p stent to RCA 2010; ms/p DES to CX 2014; s/p DES to LAD 2022, severe in-stent restenosis of the RCA with 70% proximal disease and total occlusion of the mid and distal segment. Asa and Plavix for 1 month followed by indefinite clopidogrel 75 mg daily), CHF, HTN, CVA, CKD, DM, RA. Hx substance abuse. Current smoker.   Hospitalized 7/23-7/27/23 for urosepsis with left hydronephrosis and hydroureter with urinary obstruction due to infected ureteral calculus. Complicated by acute on chronic renal failure  Pt to hold plavix 5 days before surgery   VS: BP 126/64   Pulse 70   Temp 36.8 C (Oral)   Ht 5' (1.524 m)   Wt 79.8 kg   LMP 01/04/2015   SpO2 99%   BMI 34.37 kg/m   PROVIDERS: - PCP is Mack Hook, MD - Cardiologist is Larae Grooms, MD. Cleared for surgery at acceptable risk at last office visit with Leanor Kail, [place] 03/06/22  LABS: Labs reviewed: Acceptable for surgery. - CBC 02/23/22: acceptable - BMP 02/23/22: glucose 261; creatinine 1.23; potassium 3.1 - HbA1c 02/23/22: 7.2  (all labs ordered are listed, but only abnormal results are displayed)  Labs Reviewed  GLUCOSE, CAPILLARY - Abnormal; Notable for the following components:      Result Value   Glucose-Capillary 67 (*)    All other components within normal limits     IMAGES: 1 view CXR 02/19/22:  - Cardiomegaly with mild, diffuse bilateral interstitial pulmonary opacity, likely edema. No focal airspace opacity.  EKG 03/06/22: NSR. Possible LAE. Inferior infarct, age undetermined   CV: Cardiac cath 08/13/20:  Multivessel coronary  artery disease, including sequential 60-70% and 20-30% mid LAD stenoses (DFR significant @ 0.85), 40% mid LCx lesion, and chronic total occlusion of mid/distal RCA.  The distal RCA branches fill via faint left-to-right collaterals. Widely patent mid/distal LCx stent. Severe in-stent restenosis of RCA with 70% proximal disease and chronic total occlusion of the mid and distal segments. Normal left ventricular filling pressure. Successful DFR-guided PCI to mid LAD using Resolute Onyx 2.75 x 26 mm drug eluting stent (postdilated to 3.1 mm) with 10% residual stenosis and TIMI-3 flow. Recommendations: Dual antiplatelet therapy with aspirin and clopidogrel for up to 1 month (if tolerated) followed by indefinite clopidogrel 75 mg daily. Aggressive secondary prevention of coronary artery disease.  Echo 08/12/20:  1. Left ventricular ejection fraction, by estimation, is 55 to 60%. The left ventricle has normal function. The left ventricle has no regional wall motion abnormalities. Left ventricular diastolic parameters were normal.  2. Right ventricular systolic function is normal. The right ventricular size is mildly enlarged. Tricuspid regurgitation signal is inadequate for assessing PA pressure.  3. Left atrial size was mildly dilated.  4. Right atrial size was mildly dilated.  5. The mitral valve is normal in structure. Trivial mitral valve regurgitation.  6. The aortic valve is tricuspid. Aortic valve regurgitation is not visualized. No aortic stenosis is present.  7. The inferior vena cava is normal in size with <50% respiratory variability, suggesting right atrial pressure of 8 mmHg.    Past Medical History:  Diagnosis Date   Acute myocardial infarction of other lateral wall, initial episode of care    Acute myocardial infarction, unspecified site, initial episode of care    Acute osteomyelitis    Allergic rhinitis    Anemia    Anginal pain ([place])    07/15/13- no chest pain in months"    Anxiety    Arthritis    Bipolar affective ([place])    CAD (coronary artery disease) 07/2011   s/p DES mid and distal RCA with 50% LAD   CKD stage 1 due to type 2 diabetes mellitus ([place])    Daily headache    not daily   Depression    Bipolar disorder   Diabetic foot ulcer associated with diabetes mellitus due to underlying condition ([place]) 09/26/2018   Diabetic gastroparesis associated with type 2 diabetes mellitus ([place])    Diabetic peripheral neuropathy associated with type 2 diabetes mellitus ([place])    Diabetic retinopathy    Esophageal stenosis    Esophageal ulcer    Esophagitis    Gastroparesis    Genital herpes    Reportedly tested and documented by [facility]--rare occurrences   GERD (gastroesophageal reflux disease)    Heart murmur    History of stomach ulcers    Hyperlipidemia    Hypertension    Inferior MI ([place]) 01/20/2009   Archie Endo on 12/19/2012, "that's the only one I've had" (12/19/2012)   Migraines    Onychomycosis 02/07/2021   Orthopnoea    Paronychia of finger, left 02/07/2021   Pneumonia 2012   Polysubstance abuse ([place])    Crack cocaine--none since 2008, MJ, ETOH:  clean of all since 2008   Renal insufficiency    Renal lithiasis    Bilateral   Rheumatoid arthritis(714.0)    Sebaceous cyst    Stroke [facility]) 2011   denies residual on 12/19/2012.  "Years ago"   Type II diabetes mellitus ([place])    Previously uncontrolled for many years with multiple complications.  2017 controlled. 05/2016:  6.7%   Vitamin D deficiency     Past Surgical History:  Procedure Laterality Date   CORONARY ANGIOPLASTY WITH STENT PLACEMENT  01/20/2009   "2" (12/19/2012)   CORONARY ANGIOPLASTY WITH STENT PLACEMENT  2012   "2" (12/19/2012)   CORONARY ANGIOPLASTY WITH STENT PLACEMENT  12/19/2012   "2" (12/19/2012)   CORONARY STENT INTERVENTION N/A 08/13/2020   Procedure: CORONARY STENT INTERVENTION;  Surgeon: Nelva Bush, MD;  Location: [place] CV LAB;  Service: Cardiovascular;   Laterality: N/A;   CYSTOSCOPY W/ URETERAL STENT PLACEMENT Left 02/19/2022   Procedure: CYSTOSCOPY WITH URETERAL STENT PLACEMENT;  Surgeon: Irine Seal, MD;  [facility];  Service: Urology;  Laterality: Left;   ESOPHAGOGASTRODUODENOSCOPY N/A 02/26/2015   Procedure: ESOPHAGOGASTRODUODENOSCOPY (EGD);  Surgeon: Teena Irani, MD;  Location: [facility] ENDOSCOPY;  Service: Endoscopy;  Laterality: N/A;   EYE SURGERY     Multiple surgeries of both eyes:  last laser was 09/08/2015 of right eye.  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   GAS INSERTION Left 07/16/2013   Procedure: INSERTION OF GAS;  Surgeon: Adonis Brook, MD;  Location: [place];  Service: Ophthalmology;  Laterality: Left;  SF6   GAS/FLUID EXCHANGE Left 07/30/2013   Procedure: GAS/FLUID EXCHANGE;  Surgeon: Adonis Brook, MD;  Location: [place];  Service: Ophthalmology;  Laterality: Left;   INTRAVASCULAR PRESSURE WIRE/FFR STUDY N/A 08/13/2020   Procedure: INTRAVASCULAR PRESSURE WIRE/FFR STUDY;  Surgeon: Nelva Bush, MD;  Location: [facility]  INVASIVE CV LAB;  Service: Cardiovascular;  Laterality: N/A;   IRRIGATION AND DEBRIDEMENT SEBACEOUS CYST Right 03/2011   "pointer" (12/19/2012)   LEFT HEART CATH AND CORONARY ANGIOGRAPHY N/A 08/13/2020   Procedure: LEFT HEART CATH AND CORONARY ANGIOGRAPHY;  Surgeon: Nelva Bush, MD;  Location: [place] CV LAB;  Service: Cardiovascular;  Laterality: N/A;   LEFT HEART CATHETERIZATION WITH CORONARY ANGIOGRAM N/A 07/06/2011   Procedure: LEFT HEART CATHETERIZATION WITH CORONARY ANGIOGRAM;  Surgeon: Jettie Booze, MD;  Location: [facility] CATH LAB;  Service: Cardiovascular;  Laterality: N/A;  possible PCI   LEFT HEART CATHETERIZATION WITH CORONARY ANGIOGRAM N/A 12/19/2012   Procedure: LEFT HEART CATHETERIZATION WITH CORONARY ANGIOGRAM;  Surgeon: Jettie Booze, MD;  Location: [facility] CATH LAB;  Service: Cardiovascular;  Laterality: N/A;   MEMBRANE PEEL Left 07/16/2013   Procedure: MEMBRANE PEEL;   Surgeon: Adonis Brook, MD;  Location: [place];  Service: Ophthalmology;  Laterality: Left;   PARS PLANA VITRECTOMY Left 07/16/2013   Procedure: PARS PLANA VITRECTOMY WITH 23 GAUGE;  Surgeon: Adonis Brook, MD;  Location: [place];  Service: Ophthalmology;  Laterality: Left;   PARS PLANA VITRECTOMY Left 07/30/2013   Procedure: PARS PLANA VITRECTOMY WITH 23 GAUGE WITH ENDOLASER;  Surgeon: Adonis Brook, MD;  Location: [place];  Service: Ophthalmology;  Laterality: Left;  with endolaser   PERCUTANEOUS CORONARY STENT INTERVENTION (PCI-S) N/A 07/06/2011   Procedure: PERCUTANEOUS CORONARY STENT INTERVENTION (PCI-S);  Surgeon: Jettie Booze, MD;  Location: [facility] CATH LAB;  Service: Cardiovascular;  Laterality: N/A;   PHOTOCOAGULATION WITH LASER Left 07/16/2013   Procedure: PHOTOCOAGULATION WITH LASER;  Surgeon: Adonis Brook, MD;  Location: [place];  Service: Ophthalmology;  Laterality: Left;  ENDOLASER    MEDICATIONS:  Accu-Chek FastClix Lancets MISC   acetaminophen (TYLENOL) 325 MG tablet   albuterol (PROVENTIL HFA;VENTOLIN HFA) 108 (90 Base) MCG/ACT inhaler   ammonium lactate (AMLACTIN) 12 % cream   Blood Glucose Monitoring Suppl (ACCU-CHEK GUIDE ME) w/Device KIT   Cholecalciferol (VITAMIN D3) 10 MCG (400 UNIT) tablet   clopidogrel (PLAVIX) 75 MG tablet   Continuous Blood Gluc Receiver (DEXCOM G6 RECEIVER) DEVI   Continuous Blood Gluc Sensor (FREESTYLE LIBRE 2 SENSOR) MISC   Continuous Blood Gluc Transmit (DEXCOM G6 TRANSMITTER) MISC   cyclobenzaprine (FLEXERIL) 10 MG tablet   famotidine (PEPCID) 20 MG tablet   folic acid (FOLVITE) 1 MG tablet   furosemide (LASIX) 40 MG tablet   hydrOXYzine (ATARAX) 25 MG tablet   Incontinence Supply Disposable (POISE ULTRA THINS) PADS   insulin aspart (NOVOLOG) 100 UNIT/ML injection   LANTUS SOLOSTAR 100 UNIT/ML Solostar Pen   methotrexate (RHEUMATREX) 2.5 MG tablet   metoprolol tartrate (LOPRESSOR) 25 MG tablet   nitroGLYCERIN (NITROSTAT) 0.4 MG SL tablet    ondansetron (ZOFRAN) 4 MG tablet   pantoprazole (PROTONIX) 40 MG tablet   polyethylene glycol (MIRALAX / GLYCOLAX) 17 g packet   prednisoLONE acetate (PRED FORTE) 1 % ophthalmic suspension   rosuvastatin (CRESTOR) 40 MG tablet   senna-docusate (SENOKOT-S) 8.6-50 MG tablet   sertraline (ZOLOFT) 100 MG tablet   traMADol (ULTRAM) 50 MG tablet   No current facility-administered medications for this encounter.    If no changes, I anticipate pt can proceed with surgery as scheduled.   Willeen Cass, PhD, FNP-BC [facility]/Anesthesiology Phone: [phone] 03/10/2022 9:13 AM

## 2022-03-13 NOTE — H&P (Signed)
Pt presents today for pre-operative history and physical exam in anticipation of cysto with left ureteroscopy and stent exchange by Dr. Annabell Howells on 03/14/22. She was admitted/treated in 01/2022 for sepsis due to a left ureteral obstruction with stone and hydroureteronephrosis. On 02/19/22 pt had a cysto with left ureteral stent performed by Dr. Cyndie Chime. She was treated with IV Rocephin and PO Keflex for a total of 10 days.   She is having a good bit of discomfort from the stent but has only been using her Ultram on occasion. She has hematuria but no dysuria. States she completed her Abx course.   Cardiologist is Everette Rank cleared pt for procedure on 03/06/22.   Pt denies F/C, HA, CP, SOB, N/V, diarrhea/constipation, back pain, and dysuria.     ALLERGIES: aspirin - bleeding    MEDICATIONS: Albuterol Sulfate Hfa 90 mcg hfa aerosol with adapter  Clopidogrel 75 mg tablet 1 tablet PO Daily  Dexcom G6 Sensor each 1 PO Daily  Dexcom G6 Transmitter each 1 PO Daily  Famotidine 20 mg tablet 1 tablet PO Daily  Folic Acid 1 mg tablet 1 tablet PO Daily  Furosemide 40 mg tablet 1 tablet PO Daily  Gabapentin 100 mg capsule  Hydroxyzine Hcl 25 mg tablet 1 tablet PO Daily  Itraconazole 100 mg capsule 1 capsule PO Daily  Lantus Solostar 100 unit/ml (3 ml) insulin pen 1 PO Daily  Methotrexate 2.5 mg tablet 1 tablet PO Daily  Metoprolol Tartrate 25 mg tablet 1 tablet PO Daily  Nitroglycerin 0.4 mg tablet, sublingual 1 tablet PO Daily  Novolog Flexpen 100 unit/ml (3 ml) insulin pen 1 PO Daily  Ondansetron Hcl 4 mg tablet 1 tablet PO Daily  Pantoprazole Sodium 40 mg tablet, delayed release 1 tablet PO Daily  Prednisolone Acetate 1 % suspension, drops 1 ml PO Daily  Rosuvastatin Calcium 40 mg tablet 1 tablet PO Daily  Sertraline Hcl 100 mg tablet 1 tablet PO Daily  Tramadol Hcl 50 mg tablet 1 tablet PO Daily     Notes: Tylenol  Proventil HFA 108 (90 base) mcg/act inhaler  Plavix 75 mg QD  Flexeril 10 mg prn   Pepcid 20 mg BID  Folvite 1 mg QD  Lasix 40 mg QD  Atarax 25 mg BID  Novolog 100 unit/ml sliding scale  Lantus 100 unit/ml 12 units BID  Methotrexate 2.5mg  10 mg q weds  Lopressor25 mg 2 BID  Nitrostat SL prn  Zofran 4 mg q 6 prn  Protonix 40 mg qd  Miralax 17 g qd  Prednisolone 1% opthalmic 4-5 drops left eye QD  Crestor 40 mg QD  Senokot s 1 qhs prn  Zoloft 100mg  1.5 tab QD  Tramadol 50 mg q 6 prn    GU PSH: None     PSH Notes: cysto with stent  EGD      NON-GU PSH: Cardiac Stent Placement Eye Surgery (Unspecified), Left     GU PMH: None     PMH Notes: CAD s/p MI s/p stenting 2010 and NSTEMI 5/14 On Plavix  CHF  CKD IIIa  DM  HTN  tobacco abuse  HLD  acute osteomyelitis  anemia  anxiety  bipolar affective  depression  Peripheral neuropathy  DM retinopathy  Esophageal stenosis  esophagitis  genital herpes  GERD  Heart murmur  polysubstance abuse including crack cocaine (none since 2008)  RA  CVA 2014 no residual  nephrolithiasis   NON-GU PMH: None   FAMILY HISTORY: None   SOCIAL HISTORY:  Marital Status: Divorced Preferred Language: English; Ethnicity: Not Hispanic Or Latino; Race: Black or African American Current Smoking Status: Patient smokes.   Tobacco Use Assessment Completed: Used Tobacco in last 30 days? Does not use smokeless tobacco. Has never drank.  Does not use drugs. Drinks 1 caffeinated drink per day. Has not had a blood transfusion.     Notes: Smokes 5 cigarettes per day    REVIEW OF SYSTEMS:    GU Review Female:   Patient denies frequent urination, hard to postpone urination, burning /pain with urination, get up at night to urinate, leakage of urine, stream starts and stops, trouble starting your stream, have to strain to urinate, and being pregnant.  Gastrointestinal (Upper):   Patient denies nausea, vomiting, and indigestion/ heartburn.  Gastrointestinal (Lower):   Patient denies diarrhea and constipation.   Constitutional:   Patient denies fever, night sweats, weight loss, and fatigue.  Skin:   Patient denies skin rash/ lesion and itching.  Eyes:   Patient denies blurred vision and double vision.  Ears/ Nose/ Throat:   Patient denies sore throat and sinus problems.  Hematologic/Lymphatic:   Patient denies swollen glands and easy bruising.  Cardiovascular:   Patient denies leg swelling and chest pains.  Respiratory:   Patient denies cough and shortness of breath.  Endocrine:   Patient denies excessive thirst.  Musculoskeletal:   Patient denies back pain and joint pain.  Neurological:   Patient denies headaches and dizziness.  Psychologic:   Patient denies depression and anxiety.   VITAL SIGNS:      03/07/2022 03:12 PM  Weight 175 lb / 79.38 kg  Height 60 in / 152.4 cm  BP 114/74 mmHg  Pulse 78 /min  Temperature 97.7 F / 36.5 C  BMI 34.2 kg/m   GU PHYSICAL EXAMINATION:    Breast: Symmetrical. No tenderness, no nipple discharge, no skin changes. No mass.  Digital Rectal Exam: Normal sphincter tone. No rectal mass.  External Genitalia: No hirsutism, no rash, no scarring, no cyst, no erythematous lesion, no papular lesion, no blanched lesion, no warty lesion. No edema.  Urethral Meatus: Normal size. Normal position. No discharge.  Urethra: No tenderness, no mass, no scarring. No hypermobility. No leakage.  Bladder: Normal to palpation, no tenderness, no mass, normal size.  Vagina: No atrophy, no stenosis. No rectocele. No cystocele. No enterocele.  Cervix: No inflammation, no discharge, no lesion, no tenderness, no wart.  Uterus: Normal size. Normal consistency. Normal position. No mobility. No descent.  Adnexa / Parametria: No tenderness. No adnexal mass. Normal left ovary. Normal right ovary.  Anus and Perineum: No hemorrhoids. No anal stenosis. No rectal fissure, no anal fissure. No edema, no dimple, no perineal tenderness, no anal tenderness.   MULTI-SYSTEM PHYSICAL EXAMINATION:     Constitutional: Well-nourished. No physical deformities. Normally developed. Good grooming.  Neck: Neck symmetrical, not swollen. Normal tracheal position.  Respiratory: Normal breath sounds. No labored breathing, no use of accessory muscles.   Cardiovascular: Regular rate and rhythm. II/VI murmur, no gallop.   Lymphatic: No enlargement of neck, axillae, groin.  Skin: No paleness, no jaundice, no cyanosis. No lesion, no ulcer, no rash.  Neurologic / Psychiatric: Oriented to time, oriented to place, oriented to person. No depression, no anxiety, no agitation.  Gastrointestinal: No mass, no tenderness, no rigidity, obese abdomen.   Eyes: Normal conjunctivae. Normal eyelids.  Ears, Nose, Mouth, and Throat: Left ear no scars, no lesions, no masses. Right ear no scars, no lesions, no masses.  Nose no scars, no lesions, no masses. Normal hearing. Normal lips.  Musculoskeletal: Normal gait and station of head and neck.     Complexity of Data:  Records Review:   Previous Patient Records  Urine Test Review:   Urinalysis   03/07/22  Urinalysis  Urine Appearance Cloudy   Urine Color Red   Urine Glucose Neg mg/dL  Urine Bilirubin Neg mg/dL  Urine Ketones Neg mg/dL  Urine Specific Gravity 1.015   Urine Blood 3+ ery/uL  Urine pH 7.5   Urine Protein 1+ mg/dL  Urine Urobilinogen 0.2 mg/dL  Urine Nitrites Neg   Urine Leukocyte Esterase 3+ leu/uL  Urine WBC/hpf 0 - 5/hpf   Urine RBC/hpf >60/hpf   Urine Epithelial Cells NS (Not Seen)   Urine Bacteria Few (10-25/hpf)   Urine Mucous Not Present   Urine Yeast NS (Not Seen)   Urine Trichomonas Not Present   Urine Cystals NS (Not Seen)   Urine Casts NS (Not Seen)   Urine Sperm Not Present    PROCEDURES:          Urinalysis w/Scope - 81001 Dipstick Dipstick Cont'd Micro  Color: Red Bilirubin: Neg mg/dL WBC/hpf: 0 - 5/hpf  Appearance: Cloudy Ketones: Neg mg/dL RBC/hpf: >13/KGM  Specific Gravity: 1.015 Blood: 3+ ery/uL Bacteria: Few (10-25/hpf)   pH: 7.5 Protein: 1+ mg/dL Cystals: NS (Not Seen)  Glucose: Neg mg/dL Urobilinogen: 0.2 mg/dL Casts: NS (Not Seen)    Nitrites: Neg Trichomonas: Not Present    Leukocyte Esterase: 3+ leu/uL Mucous: Not Present      Epithelial Cells: NS (Not Seen)      Yeast: NS (Not Seen)      Sperm: Not Present    Notes: Unspun micro due to increased RBC's.    ASSESSMENT:      ICD-10 Details  1 GU:   Hydronephrosis - N13.0   2   Ureteral obstruction secondary to calculous - N13.2    PLAN:           Orders Labs Urine Culture          Schedule Return Visit/Planned Activity: Keep Scheduled Appointment - Schedule Surgery          Document Letter(s):  Created for Patient: Clinical Summary         Notes:   There are no changes in the patients history or physical exam since last evaluation by Dr. Annabell Howells. Pt is scheduled to undergo cysto with left ureteroscopy and stent exchange on 03/14/22.   Advised pt to use heating pad to flank for discomfort as well as to alternate scheduled Ultram and Tylenol for pain. Pt has a hx of addiction and does not want to unnecessarily increase narcotic use.   Urine consistent with stent but will check culture to r/o infection in pt with recent hx of sepsis due to pyelonephritis.   All pt's questions were answered to the best of my ability.          Next Appointment:      Next Appointment: 03/14/2022 09:00 AM    Appointment Type: Surgery     Location: [facility] [phone]    Provider: Bjorn Pippin, M.D.    Reason for Visit: OP [facility] CYSTO LT URS HLL STENT EXCHANGE

## 2022-03-14 ENCOUNTER — Ambulatory Visit (HOSPITAL_COMMUNITY): Payer: Medicaid Other | Admitting: Emergency Medicine

## 2022-03-14 ENCOUNTER — Encounter (HOSPITAL_COMMUNITY): Payer: Self-pay | Admitting: Urology

## 2022-03-14 ENCOUNTER — Ambulatory Visit (HOSPITAL_COMMUNITY)
Admission: RE | Admit: 2022-03-14 | Discharge: 2022-03-14 | Disposition: A | Discharge status: Home / Self Care | Payer: Medicaid Other | Attending: Urology | Admitting: Urology

## 2022-03-14 ENCOUNTER — Ambulatory Visit (HOSPITAL_BASED_OUTPATIENT_CLINIC_OR_DEPARTMENT_OTHER): Payer: Medicaid Other | Admitting: Anesthesiology

## 2022-03-14 ENCOUNTER — Encounter (HOSPITAL_COMMUNITY)
Admission: RE | Disposition: A | Discharge status: Home / Self Care | Payer: Self-pay | Source: Home / Self Care | Attending: Urology

## 2022-03-14 ENCOUNTER — Ambulatory Visit (HOSPITAL_COMMUNITY): Payer: Medicaid Other

## 2022-03-14 DIAGNOSIS — E119 Type 2 diabetes mellitus without complications: Secondary | ICD-10-CM | POA: Insufficient documentation

## 2022-03-14 DIAGNOSIS — I2510 Atherosclerotic heart disease of native coronary artery without angina pectoris: Secondary | ICD-10-CM | POA: Diagnosis not present

## 2022-03-14 DIAGNOSIS — E669 Obesity, unspecified: Secondary | ICD-10-CM | POA: Diagnosis not present

## 2022-03-14 DIAGNOSIS — I5032 Chronic diastolic (congestive) heart failure: Secondary | ICD-10-CM | POA: Diagnosis not present

## 2022-03-14 DIAGNOSIS — Z955 Presence of coronary angioplasty implant and graft: Secondary | ICD-10-CM | POA: Insufficient documentation

## 2022-03-14 DIAGNOSIS — F17200 Nicotine dependence, unspecified, uncomplicated: Secondary | ICD-10-CM | POA: Diagnosis not present

## 2022-03-14 DIAGNOSIS — I152 Hypertension secondary to endocrine disorders: Secondary | ICD-10-CM | POA: Insufficient documentation

## 2022-03-14 DIAGNOSIS — Z6834 Body mass index (BMI) 34.0-34.9, adult: Secondary | ICD-10-CM | POA: Insufficient documentation

## 2022-03-14 DIAGNOSIS — I25119 Atherosclerotic heart disease of native coronary artery with unspecified angina pectoris: Secondary | ICD-10-CM

## 2022-03-14 DIAGNOSIS — I252 Old myocardial infarction: Secondary | ICD-10-CM | POA: Diagnosis not present

## 2022-03-14 DIAGNOSIS — N202 Calculus of kidney with calculus of ureter: Secondary | ICD-10-CM

## 2022-03-14 DIAGNOSIS — K219 Gastro-esophageal reflux disease without esophagitis: Secondary | ICD-10-CM | POA: Insufficient documentation

## 2022-03-14 DIAGNOSIS — N179 Acute kidney failure, unspecified: Secondary | ICD-10-CM

## 2022-03-14 DIAGNOSIS — I509 Heart failure, unspecified: Secondary | ICD-10-CM | POA: Insufficient documentation

## 2022-03-14 DIAGNOSIS — Z8711 Personal history of peptic ulcer disease: Secondary | ICD-10-CM | POA: Insufficient documentation

## 2022-03-14 DIAGNOSIS — I130 Hypertensive heart and chronic kidney disease with heart failure and stage 1 through stage 4 chronic kidney disease, or unspecified chronic kidney disease: Secondary | ICD-10-CM | POA: Diagnosis not present

## 2022-03-14 DIAGNOSIS — I110 Hypertensive heart disease with heart failure: Secondary | ICD-10-CM | POA: Insufficient documentation

## 2022-03-14 DIAGNOSIS — Z794 Long term (current) use of insulin: Secondary | ICD-10-CM

## 2022-03-14 DIAGNOSIS — N132 Hydronephrosis with renal and ureteral calculous obstruction: Secondary | ICD-10-CM | POA: Diagnosis present

## 2022-03-14 DIAGNOSIS — N181 Chronic kidney disease, stage 1: Secondary | ICD-10-CM

## 2022-03-14 DIAGNOSIS — E1122 Type 2 diabetes mellitus with diabetic chronic kidney disease: Secondary | ICD-10-CM

## 2022-03-14 DIAGNOSIS — Z8673 Personal history of transient ischemic attack (TIA), and cerebral infarction without residual deficits: Secondary | ICD-10-CM | POA: Diagnosis not present

## 2022-03-14 HISTORY — PX: CYSTOSCOPY/URETEROSCOPY/HOLMIUM LASER/STENT PLACEMENT: SHX6546

## 2022-03-14 LAB — BASIC METABOLIC PANEL
Anion gap: 9 (ref 5–15)
BUN: 35 mg/dL — ABNORMAL HIGH (ref 6–20)
CO2: 23 mmol/L (ref 22–32)
Calcium: 9.3 mg/dL (ref 8.9–10.3)
Chloride: 107 mmol/L (ref 98–111)
Creatinine, Ser: 1.21 mg/dL — ABNORMAL HIGH (ref 0.44–1.00)
GFR, Estimated: 53 mL/min — ABNORMAL LOW (ref >60)
Glucose, Bld: 188 mg/dL — ABNORMAL HIGH (ref 70–99)
Potassium: 4.8 mmol/L (ref 3.5–5.1)
Sodium: 139 mmol/L (ref 135–145)

## 2022-03-14 LAB — GLUCOSE, CAPILLARY
Glucose-Capillary: 166 mg/dL — ABNORMAL HIGH (ref 70–99)
Glucose-Capillary: 243 mg/dL — ABNORMAL HIGH (ref 70–99)
Glucose-Capillary: 263 mg/dL — ABNORMAL HIGH (ref 70–99)
Glucose-Capillary: 231 mg/dL — ABNORMAL HIGH (ref 70–99)

## 2022-03-14 LAB — CBC
HCT: 36.7 % (ref 36.0–46.0)
Hemoglobin: 11.7 g/dL — ABNORMAL LOW (ref 12.0–15.0)
MCH: 29.5 pg (ref 26.0–34.0)
MCHC: 31.9 g/dL (ref 30.0–36.0)
MCV: 92.7 fL (ref 80.0–100.0)
Platelets: 352 10*3/uL (ref 150–400)
RBC: 3.96 MIL/uL (ref 3.87–5.11)
RDW: 17.6 % — ABNORMAL HIGH (ref 11.5–15.5)
WBC: 8 10*3/uL (ref 4.0–10.5)
nRBC: 0 % (ref 0.0–0.2)

## 2022-03-14 SURGERY — CYSTOSCOPY/URETEROSCOPY/HOLMIUM LASER/STENT PLACEMENT
Anesthesia: General | Site: Ureter | Laterality: Left

## 2022-03-14 MED ORDER — PROPOFOL 10 MG/ML IV BOLUS
INTRAVENOUS | Status: AC
  Filled 2022-03-14: qty 20

## 2022-03-14 MED ORDER — SODIUM CHLORIDE 0.9% FLUSH: 3.0000 mL | Freq: Two times a day (BID) | INTRAVENOUS | Status: DC

## 2022-03-14 MED ORDER — TRAMADOL HCL 50 MG PO TABS
50.0000 mg | ORAL_TABLET | Freq: Four times a day (QID) | ORAL | Status: DC | PRN
Start: 2022-03-14 — End: 2022-03-14

## 2022-03-14 MED ORDER — PHENYLEPHRINE 80 MCG/ML (10ML) SYRINGE FOR IV PUSH (FOR BLOOD PRESSURE SUPPORT)
PREFILLED_SYRINGE | INTRAVENOUS | Status: DC | PRN
  Administered 2022-03-14 (×2): 160 ug via INTRAVENOUS

## 2022-03-14 MED ORDER — LIDOCAINE 2% (20 MG/ML) 5 ML SYRINGE
INTRAMUSCULAR | Status: DC | PRN
  Administered 2022-03-14: 100 mg via INTRAVENOUS

## 2022-03-14 MED ORDER — CHLORHEXIDINE GLUCONATE 0.12 % MT SOLN
15.0000 mL | Freq: Once | OROMUCOSAL | Status: AC
  Administered 2022-03-14: 15 mL via OROMUCOSAL

## 2022-03-14 MED ORDER — SODIUM CHLORIDE 0.9 % IR SOLN
Status: DC | PRN
  Administered 2022-03-14: 1000 mL

## 2022-03-14 MED ORDER — FENTANYL CITRATE (PF) 100 MCG/2ML IJ SOLN
INTRAMUSCULAR | Status: DC | PRN
  Administered 2022-03-14 (×2): 25 ug via INTRAVENOUS
  Administered 2022-03-14: 50 ug via INTRAVENOUS

## 2022-03-14 MED ORDER — MIDAZOLAM HCL 5 MG/5ML IJ SOLN
INTRAMUSCULAR | Status: DC | PRN
  Administered 2022-03-14 (×2): 1 mg via INTRAVENOUS

## 2022-03-14 MED ORDER — INSULIN ASPART 100 UNIT/ML IJ SOLN
3.0000 [IU] | Freq: Once | INTRAMUSCULAR | Status: AC
  Administered 2022-03-14: 3 [IU] via SUBCUTANEOUS

## 2022-03-14 MED ORDER — DEXAMETHASONE SODIUM PHOSPHATE 10 MG/ML IJ SOLN
INTRAMUSCULAR | Status: AC
  Filled 2022-03-14: qty 1

## 2022-03-14 MED ORDER — INSULIN ASPART 100 UNIT/ML IJ SOLN
INTRAMUSCULAR | Status: DC
Start: 2022-03-14 — End: 2022-03-14
  Filled 2022-03-14: qty 1

## 2022-03-14 MED ORDER — AMPICILLIN 500 MG PO CAPS: 500.0000 mg | ORAL_CAPSULE | Freq: Four times a day (QID) | ORAL | 3 refills | Status: DC

## 2022-03-14 MED ORDER — SODIUM CHLORIDE 0.9 % IR SOLN
Status: DC | PRN
  Administered 2022-03-14: 3000 mL

## 2022-03-14 MED ORDER — PROPOFOL 10 MG/ML IV BOLUS
INTRAVENOUS | Status: DC | PRN
  Administered 2022-03-14: 10 mg via INTRAVENOUS
  Administered 2022-03-14: 30 mg via INTRAVENOUS
  Administered 2022-03-14: 140 mg via INTRAVENOUS

## 2022-03-14 MED ORDER — PHENYLEPHRINE 80 MCG/ML (10ML) SYRINGE FOR IV PUSH (FOR BLOOD PRESSURE SUPPORT)
PREFILLED_SYRINGE | INTRAVENOUS | Status: AC
  Filled 2022-03-14: qty 10

## 2022-03-14 MED ORDER — FENTANYL CITRATE (PF) 100 MCG/2ML IJ SOLN
INTRAMUSCULAR | Status: AC
  Filled 2022-03-14: qty 2

## 2022-03-14 MED ORDER — PROMETHAZINE HCL 25 MG/ML IJ SOLN: 6.2500 mg | INTRAMUSCULAR | Status: DC | PRN

## 2022-03-14 MED ORDER — FENTANYL CITRATE PF 50 MCG/ML IJ SOSY
50.0000 ug | PREFILLED_SYRINGE | INTRAMUSCULAR | Status: DC
  Filled 2022-03-14: qty 1

## 2022-03-14 MED ORDER — SODIUM CHLORIDE 0.9 % IV SOLN
2.0000 g | INTRAVENOUS | Status: AC
  Administered 2022-03-14: 2 g via INTRAVENOUS
  Filled 2022-03-14: qty 20

## 2022-03-14 MED ORDER — IOHEXOL 300 MG/ML  SOLN
INTRAMUSCULAR | Status: DC | PRN
  Administered 2022-03-14: 5 mL

## 2022-03-14 MED ORDER — FENTANYL CITRATE PF 50 MCG/ML IJ SOSY: 25.0000 ug | PREFILLED_SYRINGE | INTRAMUSCULAR | Status: DC | PRN

## 2022-03-14 MED ORDER — ORAL CARE MOUTH RINSE: 15.0000 mL | Freq: Once | OROMUCOSAL | Status: AC

## 2022-03-14 MED ORDER — ONDANSETRON HCL 4 MG/2ML IJ SOLN
INTRAMUSCULAR | Status: AC
  Filled 2022-03-14: qty 2

## 2022-03-14 MED ORDER — ONDANSETRON HCL 4 MG/2ML IJ SOLN
INTRAMUSCULAR | Status: DC | PRN
  Administered 2022-03-14: 4 mg via INTRAVENOUS

## 2022-03-14 MED ORDER — MIDAZOLAM HCL 2 MG/2ML IJ SOLN
INTRAMUSCULAR | Status: AC
Start: 2022-03-14 — End: ?
  Filled 2022-03-14: qty 2

## 2022-03-14 MED ORDER — LIDOCAINE 2% (20 MG/ML) 5 ML SYRINGE
INTRAMUSCULAR | Status: AC
Start: 2022-03-14 — End: ?
  Filled 2022-03-14: qty 5

## 2022-03-14 MED ORDER — INSULIN ASPART 100 UNIT/ML IJ SOLN
5.0000 [IU] | Freq: Once | INTRAMUSCULAR | Status: AC
Start: 2022-03-14 — End: 2022-03-14
  Administered 2022-03-14: 5 [IU] via SUBCUTANEOUS

## 2022-03-14 MED ORDER — LACTATED RINGERS IV SOLN: INTRAVENOUS | Status: DC

## 2022-03-14 SURGICAL SUPPLY — 23 items
BAG URO CATCHER STRL LF (MISCELLANEOUS) ×2 IMPLANT
BASKET STONE NCOMPASS (UROLOGICAL SUPPLIES) IMPLANT
CATH URETERAL DUAL LUMEN 10F (MISCELLANEOUS) IMPLANT
CATH URETL OPEN 5X70 (CATHETERS) IMPLANT
CLOTH BEACON ORANGE TIMEOUT ST (SAFETY) ×2 IMPLANT
EXTRACTOR STONE NITINOL NGAGE (UROLOGICAL SUPPLIES) ×1 IMPLANT
GLOVE SURG SS PI 8.0 STRL IVOR (GLOVE) ×2 IMPLANT
GOWN STRL REUS W/ TWL XL LVL3 (GOWN DISPOSABLE) ×1 IMPLANT
GOWN STRL REUS W/TWL XL LVL3 (GOWN DISPOSABLE) ×2
GUIDEWIRE STR DUAL SENSOR (WIRE) ×2 IMPLANT
IV NS IRRIG 3000ML ARTHROMATIC (IV SOLUTION) ×2 IMPLANT
KIT TURNOVER KIT A (KITS) IMPLANT
LASER FIB FLEXIVA PULSE ID 365 (Laser) IMPLANT
LASER FIB FLEXIVA PULSE ID 550 (Laser) IMPLANT
LASER FIB FLEXIVA PULSE ID 910 (Laser) IMPLANT
MANIFOLD NEPTUNE II (INSTRUMENTS) ×2 IMPLANT
PACK CYSTO (CUSTOM PROCEDURE TRAY) ×2 IMPLANT
SHEATH NAVIGATOR HD 11/13X36 (SHEATH) ×1 IMPLANT
STENT URET 6FRX24 CONTOUR (STENTS) ×1 IMPLANT
TRACTIP FLEXIVA PULS ID 200XHI (Laser) IMPLANT
TRACTIP FLEXIVA PULSE ID 200 (Laser) ×2
TUBING CONNECTING 10 (TUBING) ×2 IMPLANT
TUBING UROLOGY SET (TUBING) ×2 IMPLANT

## 2022-03-14 NOTE — Anesthesia Postprocedure Evaluation (Signed)
Anesthesia Post Note  Patient: [name] [name]  Procedure(s) Performed: CYSTOSCOPY LEFT URETEROSCOPY/HOLMIUM LASER/STENT EXCHANGE (Left: Ureter)     Patient location during evaluation: PACU Anesthesia Type: General Level of consciousness: sedated and patient cooperative Pain management: pain level controlled Vital Signs Assessment: post-procedure vital signs reviewed and stable Respiratory status: spontaneous breathing Cardiovascular status: stable Anesthetic complications: no   No notable events documented.  Last Vitals:  Vitals:   03/14/22 1115 03/14/22 1145  BP: 132/74 124/67  Pulse: 75 78  Resp: 17 16  Temp:    SpO2: 94% 97%    Last Pain:  Vitals:   03/14/22 1145  TempSrc:   PainSc: 0-No pain                 Lewie Loron

## 2022-03-14 NOTE — Discharge Instructions (Addendum)
You may notice a small string coming from the urethral opening.  This is attached to your stent.   We will use that to remove the stent on 05/21/22 when you return for follow up.     Please bring the stone fragments to the office when you return for f/u.

## 2022-03-14 NOTE — Transfer of Care (Signed)
Immediate Anesthesia Transfer of Care Note  Patient: [name] [name]  Procedure(s) Performed: CYSTOSCOPY LEFT URETEROSCOPY/HOLMIUM LASER/STENT EXCHANGE (Left: Ureter)  Patient Location: PACU  Anesthesia Type:General  Level of Consciousness: sedated  Airway & Oxygen Therapy: Patient Spontanous Breathing and Patient connected to face mask oxygen  Post-op Assessment: Report given to RN and Post -op Vital signs reviewed and stable  Post vital signs: Reviewed and stable  Last Vitals:  Vitals Value Taken Time  BP 128/67 03/14/22 1005  Temp    Pulse 73 03/14/22 1007  Resp 20 03/14/22 1007  SpO2 92 % 03/14/22 1007  Vitals shown include unvalidated device data.  Last Pain:  Vitals:   03/14/22 0739  TempSrc:   PainSc: 6       Patients Stated Pain Goal: 2 (03/14/22 0739)  Complications: No notable events documented.

## 2022-03-14 NOTE — Interval H&P Note (Signed)
History and Physical Interval Note:  03/14/2022 8:45 AM  [name] [name]  has presented today for surgery, with the diagnosis of LEFT  URETERAL VESSICAL JUNCTION STONE.  The various methods of treatment have been discussed with the patient and family. After consideration of risks, benefits and other options for treatment, the patient has consented to  Procedure(s) with comments: CYSTOSCOPY LEFT URETEROSCOPY/HOLMIUM LASER/STENT EXCHANGE (Left) - 1 HR FOR CASE as a surgical intervention.  The patient's history has been reviewed, patient examined, no change in status, stable for surgery.  I have reviewed the patient's chart and labs.  Questions were answered to the patient's satisfaction.     Bjorn Pippin

## 2022-03-14 NOTE — Op Note (Signed)
Procedure: 1.  Cystoscopy with left ureteroscopy with holmium laser application, stone extraction and exchange of left double-J stent. 2.  Application of fluoroscopy.  Preop diagnosis: Left distal ureteral and renal stones.  Postop diagnosis: Same.  Surgeon: Dr. Bjorn Pippin.  Anesthesia: General.  Specimen: Stone's and fragments.  Drains: 6 French by 24 cm left contour double-J stent with tether.  EBL: None.  Complications: None.  Indications: The patient is a 56 year old female who originally presented with an obstructing left ureteral stone and infection.  She underwent stent placement and returns now for definitive ureteroscopy of her 6 mm left distal ureteral stone and her renal stones.  Procedure: She was taken operating room she was given Rocephin.  A general anesthetic was induced she was placed in lithotomy position and fitted with PAS hose.  Her perineum and genitalia were prepped with Betadine solution she was draped in usual sterile fashion.  Cystoscopy was performed using a 21 [place] scope and 30 degree lens.  The bladder wall had changes consistent with stent irritation and chronic follicular cystitis.  There was slightly encrusted stent loop at the left ureteral orifice with some surrounding edema.  The right ureteral orifice was unremarkable.  The stent was grasped with a grasping forceps and pulled to the urethral meatus.  An attempt was made to pass a wire up the stent but it was obstructed with encrustation so I just remove the stent.  The 6.5 French dual-lumen semirigid ureteroscope was then passed per urethra and up the ureteral orifice.  The stone was identified.  A sensor wire was then passed to the kidney through the ureteroscope and then the ureteroscope was placed alongside the wire.  A 200 m tract tip laser fiber was passed through the ureteroscope and the holmium laser was set on 0.8 J and 8 Hz on the left pedal and 1.5 J and 15 Hz on the right pedal.  The stone  was broken into manageable fragments which were then removed with the engage basket.  A 36 cm 11/13 digital access sheath was then passed over the wire to the proximal ureter under fluoroscopic guidance and the wire and inner core were removed.  The dual-lumen digital flexible scope was inserted and the collecting system was inspected.  Some stones were found in the mid pole and in the lower pole.  There were multiple stones some were quite small others were upwards of 5 to 6 mm.  Of the larger stones were removed with the engage basket and fragmented as needed for removal.  Some of the smaller stones were too small to graft and were felt to be small enough that they should pass.  Final endoscopic and fluoroscopic inspection revealed no significant large stones.  The ureteroscope was removed and a sensor wire was reinserted through the sheath to the kidney.  The sheath was removed and a 6 [place] by 24 cm contour double-J stent with tether was advanced the kidney under fluoroscopic guidance.  The wire was removed, leaving a good coil in the kidney and a good coil in the bladder.  The stent position was checked both fluoroscopically and endoscopically and was felt to be good.  The stent string was left exiting urethra.  The string was tied close to the meatus and trimmed to an appropriate length before being tucked vaginally a tampon string fashion.  She was taken down from lithotomy position, her anesthetic was reversed and she was moved to recovery in stable condition.  There were  no complications.

## 2022-03-14 NOTE — Anesthesia Procedure Notes (Signed)
Procedure Name: LMA Insertion Date/Time: 03/14/2022 9:11 AM  Performed by: Doran Clay, CRNAPre-anesthesia Checklist: Patient identified, Emergency Drugs available, Suction available, Patient being monitored and Timeout performed Patient Re-evaluated:Patient Re-evaluated prior to induction Oxygen Delivery Method: Circle system utilized Preoxygenation: Pre-oxygenation with 100% oxygen Induction Type: IV induction LMA: LMA inserted LMA Size: 4.0 Tube type: Oral Number of attempts: 1 Placement Confirmation: positive ETCO2 and breath sounds checked- equal and bilateral Tube secured with: Tape Dental Injury: Teeth and Oropharynx as per pre-operative assessment

## 2022-03-15 ENCOUNTER — Encounter (HOSPITAL_COMMUNITY): Payer: Self-pay | Admitting: Urology

## 2022-03-16 ENCOUNTER — Other Ambulatory Visit: Payer: Self-pay | Admitting: Internal Medicine

## 2022-03-19 ENCOUNTER — Other Ambulatory Visit: Payer: Self-pay | Admitting: Internal Medicine

## 2022-03-19 DIAGNOSIS — I10 Essential (primary) hypertension: Secondary | ICD-10-CM

## 2022-03-30 ENCOUNTER — Ambulatory Visit (INDEPENDENT_AMBULATORY_CARE_PROVIDER_SITE_OTHER): Payer: Medicaid Other | Admitting: Internal Medicine

## 2022-03-30 ENCOUNTER — Encounter: Payer: Self-pay | Admitting: Internal Medicine

## 2022-03-30 VITALS — BP 130/70 | HR 76 | Resp 24 | Ht 60.0 in | Wt 181.0 lb

## 2022-03-30 DIAGNOSIS — E1159 Type 2 diabetes mellitus with other circulatory complications: Secondary | ICD-10-CM

## 2022-03-30 DIAGNOSIS — Z716 Tobacco abuse counseling: Secondary | ICD-10-CM | POA: Diagnosis not present

## 2022-03-30 DIAGNOSIS — N132 Hydronephrosis with renal and ureteral calculous obstruction: Secondary | ICD-10-CM | POA: Diagnosis not present

## 2022-03-30 DIAGNOSIS — E108 Type 1 diabetes mellitus with unspecified complications: Secondary | ICD-10-CM

## 2022-03-30 DIAGNOSIS — E103599 Type 1 diabetes mellitus with proliferative diabetic retinopathy without macular edema, unspecified eye: Secondary | ICD-10-CM

## 2022-03-30 DIAGNOSIS — I152 Hypertension secondary to endocrine disorders: Secondary | ICD-10-CM

## 2022-03-30 DIAGNOSIS — M059 Rheumatoid arthritis with rheumatoid factor, unspecified: Secondary | ICD-10-CM

## 2022-03-30 DIAGNOSIS — Z720 Tobacco use: Secondary | ICD-10-CM | POA: Diagnosis not present

## 2022-03-30 NOTE — Progress Notes (Signed)
Subjective:    Patient ID: [name] [name], female   DOB: 1966-06-23, 56 y.o.   MRN: [id number]   HPI   Ureteral lithiasis:  had stent removed with P.A. Jiles Crocker, Urology, on the 21st a bit early as she was having so much discomfort.  She apparently was found to have a UTI subsequently and is now taking a new abx for 7 days and suppressive therapy with ampicillin on hold.  She cannot recall the name of the abx.  States her flank pain is much improved with stent removal.  Cannot say any difference with abx treatment.  Has follow up in 6 weeks with renal ultrasound.    2.  Tobacco abuse:  She did get a refill of nicotine nasal spray, but comes up with multiple reasons why she has not gotten started to quit.  Gets anxious (told her to increase her nicotine spray dosing)  reminds her of using cocaine, etc.  She ultimately admits she is may be not ready to quit.  3.  DM:  last A1C was 6.8%.  States had a healthy eye check recently with Dr. Jacklynn Ganong at [facility].  States had a good check.  No need to return for a year, reportedly.  She does have bilateral proliferative retinopathy.  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    4.  RA:  stable with Dr. Earnest Conroy, Rheumatology  5.  Hypertension:  controlled  6.  HM:  remembers only getting one vaccine for shingles in in 2012 with Dr. Mcarthur Rossetti after suffering from shingles.  She is certain she had only one vaccine.  Not documented in [place] as well.  Does not want today.   Will obtain at [facility] in October  Current Meds  Medication Sig   Accu-Chek FastClix Lancets MISC    acetaminophen (TYLENOL) 325 MG tablet Take 2 tablets (650 mg total) by mouth every 6 (six) hours as needed for mild pain or fever.   Blood Glucose Monitoring Suppl (ACCU-CHEK GUIDE ME) w/Device KIT    clopidogrel (PLAVIX) 75 MG tablet TAKE 1 TABLET BY MOUTH EVERY DAY   Continuous Blood Gluc Receiver (DEXCOM G6 RECEIVER) DEVI AS DIRECTED 364 DAYS   Continuous Blood Gluc Sensor (FREESTYLE LIBRE 2  SENSOR) MISC Apply topically every other day.   Continuous Blood Gluc Transmit (DEXCOM G6 TRANSMITTER) MISC AS DIRECTED 90 DAYS   cyclobenzaprine (FLEXERIL) 10 MG tablet Take 1-2 tablets (10-20 mg total) by mouth at bedtime as needed for muscle spasms. Muscle spasms   famotidine (PEPCID) 20 MG tablet Take 20 mg by mouth 2 (two) times daily.   folic acid (FOLVITE) 1 MG tablet Take 1 mg by mouth daily.   furosemide (LASIX) 40 MG tablet TAKE 1 TABLET BY MOUTH EVERY DAY IN THE MORNING   hydrOXYzine (ATARAX) 25 MG tablet Take 25 mg by mouth 2 (two) times daily.   Incontinence Supply Disposable (POISE ULTRA THINS) PADS Use 5 pads daily as needed for urinary stress incontinence   insulin aspart (NOVOLOG) 100 UNIT/ML injection Inject 4-16 Units into the skin 3 (three) times daily with meals. Per Sliding scale CBG 100-150: 4 units, 151-200: 6 units, 201-250: 8 units, 251-300: 10 units, 301-350: 12 units, 351-400: 14 units, > 401; call doctor   LANTUS SOLOSTAR 100 UNIT/ML Solostar Pen Inject 12 Units into the skin 2 (two) times daily.   methotrexate (RHEUMATREX) 2.5 MG tablet Take 10 mg by mouth every Wednesday.   metoprolol tartrate (LOPRESSOR) 25 MG tablet  TAKE 1 TABLET BY MOUTH TWICE A DAY   nitroGLYCERIN (NITROSTAT) 0.4 MG SL tablet PLACE 1 TABLET UNDER THE TONGUE EVERY 5 MINUTES AS NEEDED FOR CHEST PAIN. MAX 3 DOSES, CALL 911.   pantoprazole (PROTONIX) 40 MG tablet Take 40 mg by mouth daily.   polyethylene glycol (MIRALAX / GLYCOLAX) 17 g packet Take 17 g by mouth daily.   prednisoLONE acetate (PRED FORTE) 1 % ophthalmic suspension Place 4-5 drops into the left eye daily.   rosuvastatin (CRESTOR) 40 MG tablet TAKE 1 TABLET BY MOUTH EVERY DAY   sertraline (ZOLOFT) 100 MG tablet Take 1.5 tablets (150 mg total) by mouth daily.   traMADol (ULTRAM) 50 MG tablet TAKE 1 TABLET EVERY 6 HOURS AS NEEDED FOR MODERATE PAIN   Allergies  Allergen Reactions   Aspirin Other (See Comments)    Stomach bleeds       Review of Systems    Objective:   BP 130/70 (BP Location: Right Arm, Patient Position: Sitting, Cuff Size: Normal)   Pulse 76   Resp (!) 24   Ht 5' (1.524 m)   Wt 181 lb (82.1 kg)   LMP 01/04/2015   BMI 35.35 kg/m   Physical Exam NAD Lungs:  CTA CV:  RRR without murmur or rub.  Radial and DP pulses normal and equal Abd:  S, NT, No flank tenderness, No HSM or mass, + BS LE:  no edema.  Assessment & Plan    Left ureteral lithiasis:  reportedly doing fine after removal of stent.  Await OP records from urology.  2.  Tobacco abuse and counseling:  She continues to not follow through with cessation adequately.    Encouraged her to quit.    3.  DM:  controlled.  Retinopathy. As per retinal specialists.  4.  RA:  stable  5.  Hypertension:  controlled  6.  HM:  refusing recommended vaccines.

## 2022-04-04 DIAGNOSIS — R69 Illness, unspecified: Secondary | ICD-10-CM | POA: Diagnosis not present

## 2022-04-11 DIAGNOSIS — R69 Illness, unspecified: Secondary | ICD-10-CM | POA: Diagnosis not present

## 2022-04-13 ENCOUNTER — Ambulatory Visit (INDEPENDENT_AMBULATORY_CARE_PROVIDER_SITE_OTHER): Payer: Medicaid Other | Admitting: Podiatry

## 2022-04-13 DIAGNOSIS — L84 Corns and callosities: Secondary | ICD-10-CM

## 2022-04-13 DIAGNOSIS — B351 Tinea unguium: Secondary | ICD-10-CM | POA: Diagnosis not present

## 2022-04-13 DIAGNOSIS — M79675 Pain in left toe(s): Secondary | ICD-10-CM

## 2022-04-13 DIAGNOSIS — M79674 Pain in right toe(s): Secondary | ICD-10-CM

## 2022-04-13 DIAGNOSIS — E1149 Type 2 diabetes mellitus with other diabetic neurological complication: Secondary | ICD-10-CM | POA: Diagnosis not present

## 2022-04-13 NOTE — Progress Notes (Unsigned)
Subjective: 56 y.o. returns the office today for painful, elongated, thickened toenails which she cannot trim herself.  She has a callus at the lower left foot that needs to be trimmed.  No open lesions. Denies any fevers or chills.  No other concerns.   PCP: Julieanne Manson, MD-last seen January 12, 2022 Endocrinologist: Dr. Talmage Coin, MD-last seen October 06, 2021 A1c: 7.2 on February 20, 2022  Objective: AAO 3, NAD DP/PT pulses palpable, CRT less than 3 seconds Sensation decreased with Semmes Weinstein monofilament. Nails hypertrophic, dystrophic, elongated, brittle, discolored 10. There is tenderness overlying the nails 1-5 bilaterally. There is no surrounding erythema or drainage along the nail sites. Minimal hyperkeratotic tissue today and this is doing much better on the left foot.  There is hyperkeratotic tissue distal medial bilateral hallux without any underlying ulceration.   No open lesions Hammertoes present No pain with calf compression, swelling, warmth, erythema.  Assessment: Patient presents with symptomatic onychomycosis, hyperkeratotic lesions  Plan: -Treatment options including alternatives, risks, complications were discussed -Nails sharply debrided 10 without complication/bleeding. -Hyperkeratotic lesion sharply debrided x2 without any complications or bleeding. -Discussed daily foot inspection. If there are any changes, to call the office immediately.  -Follow-up in 3 months or sooner if any problems are to arise. In the meantime, encouraged to call the office with any questions, concerns, changes symptoms.  Ovid Curd, DPM

## 2022-04-18 DIAGNOSIS — R69 Illness, unspecified: Secondary | ICD-10-CM | POA: Diagnosis not present

## 2022-04-21 DIAGNOSIS — M2550 Pain in unspecified joint: Secondary | ICD-10-CM | POA: Diagnosis not present

## 2022-04-21 DIAGNOSIS — Z79899 Other long term (current) drug therapy: Secondary | ICD-10-CM | POA: Diagnosis not present

## 2022-04-21 DIAGNOSIS — M0609 Rheumatoid arthritis without rheumatoid factor, multiple sites: Secondary | ICD-10-CM | POA: Diagnosis not present

## 2022-05-01 DIAGNOSIS — N202 Calculus of kidney with calculus of ureter: Secondary | ICD-10-CM | POA: Diagnosis not present

## 2022-05-01 DIAGNOSIS — N3000 Acute cystitis without hematuria: Secondary | ICD-10-CM | POA: Diagnosis not present

## 2022-05-03 DIAGNOSIS — Z006 Encounter for examination for normal comparison and control in clinical research program: Secondary | ICD-10-CM

## 2022-05-03 NOTE — Research (Signed)
Message left on patient's phone with reminder of her appointment 05/04/2022 @ 0830. Parking code number also left on voice mail

## 2022-05-04 VITALS — BP 109/59 | HR 87 | Wt 183.0 lb

## 2022-05-04 DIAGNOSIS — Z006 Encounter for examination for normal comparison and control in clinical research program: Secondary | ICD-10-CM

## 2022-05-04 MED ORDER — STUDY - OCEAN(A) - OLPASIRAN (AMG 890) 142 MG/ML OR PLACEBO SQ INJECTION (PI-HILTY)
142.0000 mg | PREFILLED_SYRINGE | Freq: Once | SUBCUTANEOUS | Status: AC
  Administered 2022-05-04: 142 mg via SUBCUTANEOUS
  Filled 2022-05-04: qty 1

## 2022-05-04 NOTE — Progress Notes (Signed)
Patient seen today for Week 24 of randomized [name] study.  She seems to be doing well, and denies complaints.  From my prior note of 4/23, very pleasant female originally from [place] who has elevated LPa.  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  Currently feels well on maximum medical therapy with statins.    Bp 109/59 P87 No JVD Lungs clear Cor regular Abd non tender Ext no edema   ECG NSR.  Prior MI.  Rightward oriented axis may be do to lead position and was borderline in prior tracing of 08/2201.  Not felt to be clinically significant.  Small inferior Q's persist (known total RCA), with chronic inferior T wave inversion.  Blood obtained today for further study.   Impression  Coronary artery disease (CAD) with known elevated Lpa.    Plan  Continue [name] study for elevated Lpa.   Loretha Brasil. Lia Foyer, MD, [place] Director, [facility].

## 2022-05-04 NOTE — Research (Signed)
Patient was seen in the research clinic today for Week 24 of the [name](a) trial. Reviewed all concomitant medications and no changes noted at this time. Patient stated that she did kidney stones that caused her to be hospitalized for a short time. She was placed on antibiotics at that time but has completed them at this time. Dr Lia Foyer completed the required exam at this visit. All other procedures completed per protocol and she tolerated well without any complaints. Next appointment for week 36 was scheduled for December 28 @ 10 am.  Patient returned to the clinic for PK lab draw at 1140.  [name](a) IP ADMINISTRATION Subject [phone] IP Box #:[id number]  IP lot#: [id number] Time Given: 0488 Administration site: RUQ   Current Outpatient Medications:    Accu-Chek FastClix Lancets MISC, , Disp: , Rfl:    acetaminophen (TYLENOL) 325 MG tablet, Take 2 tablets (650 mg total) by mouth every 6 (six) hours as needed for mild pain or fever., Disp: , Rfl:    albuterol (PROVENTIL HFA;VENTOLIN HFA) 108 (90 Base) MCG/ACT inhaler, Inhale 2 puffs into the lungs every 6 (six) hours as needed for wheezing or shortness of breath., Disp: 1 Inhaler, Rfl: 0   ammonium lactate (AMLACTIN) 12 % cream, Apply topically as needed for dry skin., Disp: 385 g, Rfl: 0   Blood Glucose Monitoring Suppl (ACCU-CHEK GUIDE ME) w/Device KIT, , Disp: , Rfl:    Cholecalciferol (VITAMIN D3) 10 MCG (400 UNIT) tablet, 2 tabs by mouth daily., Disp: 180 tablet, Rfl: 3   clopidogrel (PLAVIX) 75 MG tablet, TAKE 1 TABLET BY MOUTH EVERY DAY, Disp: 90 tablet, Rfl: 2   Continuous Blood Gluc Receiver (DEXCOM G6 RECEIVER) DEVI, AS DIRECTED 364 DAYS, Disp: , Rfl:    Continuous Blood Gluc Sensor (FREESTYLE LIBRE 2 SENSOR) MISC, Apply topically every other day., Disp: , Rfl:    Continuous Blood Gluc Transmit (DEXCOM G6 TRANSMITTER) MISC, AS DIRECTED 90 DAYS, Disp: , Rfl:    cyclobenzaprine (FLEXERIL) 10 MG tablet, Take 1-2 tablets (10-20 mg  total) by mouth at bedtime as needed for muscle spasms. Muscle spasms, Disp: 30 tablet, Rfl: 0   famotidine (PEPCID) 20 MG tablet, Take 20 mg by mouth 2 (two) times daily., Disp: , Rfl:    folic acid (FOLVITE) 1 MG tablet, Take 1 mg by mouth daily., Disp: , Rfl:    furosemide (LASIX) 40 MG tablet, TAKE 1 TABLET BY MOUTH EVERY DAY IN THE MORNING, Disp: 90 tablet, Rfl: 3   insulin aspart (NOVOLOG) 100 UNIT/ML injection, Inject 4-16 Units into the skin 3 (three) times daily with meals. Per Sliding scale CBG 100-150: 4 units, 151-200: 6 units, 201-250: 8 units, 251-300: 10 units, 301-350: 12 units, 351-400: 14 units, > 401; call doctor, Disp: , Rfl:    LANTUS SOLOSTAR 100 UNIT/ML Solostar Pen, Inject 12 Units into the skin 2 (two) times daily., Disp: , Rfl:    methotrexate (RHEUMATREX) 2.5 MG tablet, Take 10 mg by mouth every Wednesday., Disp: , Rfl:    metoprolol tartrate (LOPRESSOR) 25 MG tablet, TAKE 1 TABLET BY MOUTH TWICE A DAY, Disp: 180 tablet, Rfl: 3   pantoprazole (PROTONIX) 40 MG tablet, Take 40 mg by mouth daily., Disp: , Rfl:    polyethylene glycol (MIRALAX / GLYCOLAX) 17 g packet, Take 17 g by mouth daily., Disp: 14 each, Rfl: 0   prednisoLONE acetate (PRED FORTE) 1 % ophthalmic suspension, Place 4-5 drops into the left eye daily., Disp: , Rfl: 6  rosuvastatin (CRESTOR) 40 MG tablet, TAKE 1 TABLET BY MOUTH EVERY DAY, Disp: 90 tablet, Rfl: 3   senna-docusate (SENOKOT-S) 8.6-50 MG tablet, Take 1 tablet by mouth at bedtime as needed for mild constipation or moderate constipation., Disp: 30 tablet, Rfl: 0   sertraline (ZOLOFT) 100 MG tablet, Take 1.5 tablets (150 mg total) by mouth daily., Disp: , Rfl:    traMADol (ULTRAM) 50 MG tablet, TAKE 1 TABLET EVERY 6 HOURS AS NEEDED FOR MODERATE PAIN, Disp: 90 tablet, Rfl: 2   ampicillin (PRINCIPEN) 500 MG capsule, Take 1 capsule (500 mg total) by mouth 4 (four) times daily. Take 4 x daily for 3 days and then 1 po at bedtime with the rest of the script  and the refills. (Patient not taking: Reported on 05/04/2022), Disp: 30 capsule, Rfl: 3   hydrOXYzine (ATARAX) 25 MG tablet, Take 25 mg by mouth 2 (two) times daily., Disp: , Rfl:    Incontinence Supply Disposable (POISE ULTRA THINS) PADS, Use 5 pads daily as needed for urinary stress incontinence, Disp: 150 each, Rfl: 11   nitroGLYCERIN (NITROSTAT) 0.4 MG SL tablet, PLACE 1 TABLET UNDER THE TONGUE EVERY 5 MINUTES AS NEEDED FOR CHEST PAIN. MAX 3 DOSES, CALL 911., Disp: 25 tablet, Rfl: 10   ondansetron (ZOFRAN) 4 MG tablet, 1 TAB BY MOUTH EVERY 6 HOURS AS NEEDED FOR NAUSEA AND VOMITING., Disp: 12 tablet, Rfl: 0  Current Facility-Administered Medications:    Study - [name](A) - olpasiran (AMG 890) 142 mg/mL or placebo SQ injection (PI-Hilty), 142 mg, Subcutaneous, Once, Hilty, Nadean Corwin, MD

## 2022-05-11 DIAGNOSIS — E11319 Type 2 diabetes mellitus with unspecified diabetic retinopathy without macular edema: Secondary | ICD-10-CM | POA: Diagnosis not present

## 2022-05-11 DIAGNOSIS — Z23 Encounter for immunization: Secondary | ICD-10-CM | POA: Diagnosis not present

## 2022-05-11 DIAGNOSIS — N182 Chronic kidney disease, stage 2 (mild): Secondary | ICD-10-CM | POA: Diagnosis not present

## 2022-05-11 DIAGNOSIS — E669 Obesity, unspecified: Secondary | ICD-10-CM | POA: Diagnosis not present

## 2022-05-11 DIAGNOSIS — Z794 Long term (current) use of insulin: Secondary | ICD-10-CM | POA: Diagnosis not present

## 2022-05-11 DIAGNOSIS — E1042 Type 1 diabetes mellitus with diabetic polyneuropathy: Secondary | ICD-10-CM | POA: Diagnosis not present

## 2022-05-11 DIAGNOSIS — E1022 Type 1 diabetes mellitus with diabetic chronic kidney disease: Secondary | ICD-10-CM | POA: Diagnosis not present

## 2022-05-16 DIAGNOSIS — R69 Illness, unspecified: Secondary | ICD-10-CM | POA: Diagnosis not present

## 2022-05-16 DIAGNOSIS — F411 Generalized anxiety disorder: Secondary | ICD-10-CM | POA: Diagnosis not present

## 2022-05-18 NOTE — Research (Addendum)
[name](a) week 24 [id number]            ABNORMAL LABS: TEST RESULT  RDW             16.3  RBC 4  HS-CRP 10.3  A1c 6.6  Bicarb       18.8 Creatinine      111/1.25 Alk Phos      166 Glucose      8.4/152 PT       9.6 Are these clinically significant? '[]'  YES              '[x]'  NO  Pixie Casino, MD, [facility], [place] Director of the Advanced Lipid Disorders &  Cardiovascular Risk Reduction Clinic Diplomate of the American Board of Clinical Lipidology Attending Cardiologist  Direct Dial: [phone]  Fax: [phone]  Website:  [email or web address]

## 2022-05-19 DIAGNOSIS — N3946 Mixed incontinence: Secondary | ICD-10-CM | POA: Diagnosis not present

## 2022-05-19 DIAGNOSIS — N3944 Nocturnal enuresis: Secondary | ICD-10-CM | POA: Diagnosis not present

## 2022-05-19 DIAGNOSIS — E119 Type 2 diabetes mellitus without complications: Secondary | ICD-10-CM | POA: Diagnosis not present

## 2022-05-24 ENCOUNTER — Encounter: Payer: Self-pay | Admitting: Internal Medicine

## 2022-05-24 ENCOUNTER — Ambulatory Visit (INDEPENDENT_AMBULATORY_CARE_PROVIDER_SITE_OTHER): Payer: Medicaid Other | Admitting: Internal Medicine

## 2022-05-24 VITALS — BP 148/80 | HR 76 | Resp 20 | Ht 60.0 in | Wt 188.0 lb

## 2022-05-24 DIAGNOSIS — Z716 Tobacco abuse counseling: Secondary | ICD-10-CM

## 2022-05-24 DIAGNOSIS — E559 Vitamin D deficiency, unspecified: Secondary | ICD-10-CM

## 2022-05-24 DIAGNOSIS — Z23 Encounter for immunization: Secondary | ICD-10-CM

## 2022-05-24 DIAGNOSIS — Z720 Tobacco use: Secondary | ICD-10-CM | POA: Diagnosis not present

## 2022-05-24 DIAGNOSIS — E118 Type 2 diabetes mellitus with unspecified complications: Secondary | ICD-10-CM | POA: Diagnosis not present

## 2022-05-24 DIAGNOSIS — Z0000 Encounter for general adult medical examination without abnormal findings: Secondary | ICD-10-CM

## 2022-05-24 DIAGNOSIS — Z79899 Other long term (current) drug therapy: Secondary | ICD-10-CM

## 2022-05-24 DIAGNOSIS — R319 Hematuria, unspecified: Secondary | ICD-10-CM | POA: Diagnosis not present

## 2022-05-24 DIAGNOSIS — E785 Hyperlipidemia, unspecified: Secondary | ICD-10-CM

## 2022-05-24 LAB — POCT URINALYSIS DIPSTICK
Bilirubin, UA: NEGATIVE
Blood, UA: POSITIVE
Glucose, UA: NEGATIVE
Ketones, UA: NEGATIVE
Leukocytes, UA: NEGATIVE
Nitrite, UA: NEGATIVE
Protein, UA: POSITIVE — AB
Spec Grav, UA: 1.01 (ref 1.010–1.025)
Urobilinogen, UA: 0.2 E.U./dL
pH, UA: 6 (ref 5.0–8.0)

## 2022-05-24 MED ORDER — CIPROFLOXACIN HCL 500 MG PO TABS: ORAL_TABLET | ORAL | 0 refills | Status: DC

## 2022-05-24 NOTE — Progress Notes (Signed)
Subjective:    Patient ID: [name] [name], female   DOB: 02/01/1966, 56 y.o.   MRN: [id number]   HPI  CPE without pap  1.  Pap:  Last 09/03/2019 and normal.    2.  Mammogram:  Last 01/25/2022 and normal.  Mother with history of breast cancer at age 59 and died in same year.  Sister, age 31, diagnosed in her 75s with breast cancer.      3.  Osteoprevention:  DEXA with osteopenia 12/18/2019.  Vitamin D back in normal range 04/19/21 at 33.1.She is not taking any calcium or vitamin D supplement at this time.  Walks the dogs once daily, though just recently.  Only 500 feet.    4.  Guaiac Cards/FIT:  Last performed 2018 and negative for blood     5.  Colonoscopy:  Last colonoscopy was in Sept 2021 and no polyps.  Next colonoscopy set for 2024 with Dr. Marca Ancona, [facility].  Two maternal uncles died of colon cancer in their 74s.    6.  Immunizations:  Did get influenza vaccine at Dr. Daune Perch office.  Has not had new COVID vaccine nor 2nd Shingrix. Immunization History  Administered Date(s) Administered   H1N1 07/21/2008   Hep A / Hep B 09/13/2020   Hepatitis B 01/21/2015, 08/05/2015   Hepatitis B, adult 01/21/2015, 08/05/2015, 02/08/2016   Influenza Split 03/22/2020   Influenza Whole 05/02/2006, 07/21/2008, 05/04/2009, 04/28/2010   Influenza, Quadrivalent, Recombinant, Inj, Pf 04/16/2019   Influenza,inj,Quad PF,6+ Mos 05/22/2017   Influenza-Unspecified 08/01/2015, 05/30/2016, 04/17/2018, 04/16/2019, 05/11/2022   Moderna SARS-COV2 Booster Vaccination 11/22/2020   Moderna Sars-Covid-2 Vaccination 09/29/2019, 10/27/2019, 05/31/2020   PPD Test 12/18/2011   Pneumococcal Conjugate-13 11/09/2015   Pneumococcal Polysaccharide-23 10/31/2005, 02/16/2018   Td 06/02/2004   Tdap 11/09/2015   Zoster Recombinat (Shingrix) 07/31/2010     7.  Glucose/Cholesterol:  Glucose management indicator from Dr. Daune Perch office was 6.2% on 05/11/2022, at goal.  Average glucose in the 2 preceding weeks estimated at 121.  No  cholesterol panel since February. Lipid Panel     Component Value Date/Time   CHOL 202 (H) 06/09/2022 0945   TRIG 95 06/09/2022 0945   HDL 89 06/09/2022 0945   CHOLHDL 2.6 09/14/2021 1336   CHOLHDL 2.1 06/16/2018 0642   VLDL 14 06/16/2018 0642   LDLCALC 96 06/09/2022 0945   LABVLDL 17 06/09/2022 0945      Current Meds  Medication Sig   acetaminophen (TYLENOL) 325 MG tablet Take 2 tablets (650 mg total) by mouth every 6 (six) hours as needed for mild pain or fever.   ammonium lactate (AMLACTIN) 12 % cream Apply topically as needed for dry skin.   clopidogrel (PLAVIX) 75 MG tablet TAKE 1 TABLET BY MOUTH EVERY DAY   Continuous Blood Gluc Receiver (DEXCOM G6 RECEIVER) DEVI AS DIRECTED 364 DAYS   Continuous Blood Gluc Sensor (FREESTYLE LIBRE 2 SENSOR) MISC Apply topically every other day.   Continuous Blood Gluc Transmit (DEXCOM G6 TRANSMITTER) MISC AS DIRECTED 90 DAYS   cyclobenzaprine (FLEXERIL) 10 MG tablet Take 1-2 tablets (10-20 mg total) by mouth at bedtime as needed for muscle spasms. Muscle spasms   folic acid (FOLVITE) 1 MG tablet Take 1 mg by mouth daily.   furosemide (LASIX) 40 MG tablet TAKE 1 TABLET BY MOUTH EVERY DAY IN THE MORNING   hydrOXYzine (ATARAX) 25 MG tablet Take 25 mg by mouth 2 (two) times daily.   Incontinence Supply Disposable (POISE ULTRA THINS) PADS Use  5 pads daily as needed for urinary stress incontinence   insulin aspart (NOVOLOG) 100 UNIT/ML injection Inject 4-16 Units into the skin 3 (three) times daily with meals. Per Sliding scale CBG 100-150: 4 units, 151-200: 6 units, 201-250: 8 units, 251-300: 10 units, 301-350: 12 units, 351-400: 14 units, > 401; call doctor   LANTUS SOLOSTAR 100 UNIT/ML Solostar Pen Inject 12 Units into the skin 2 (two) times daily.   methotrexate (RHEUMATREX) 2.5 MG tablet Take 10 mg by mouth every Wednesday.   metoprolol tartrate (LOPRESSOR) 25 MG tablet TAKE 1 TABLET BY MOUTH TWICE A DAY   nitroGLYCERIN (NITROSTAT) 0.4 MG SL  tablet PLACE 1 TABLET UNDER THE TONGUE EVERY 5 MINUTES AS NEEDED FOR CHEST PAIN. MAX 3 DOSES, CALL 911.   ondansetron (ZOFRAN) 4 MG tablet 1 TAB BY MOUTH EVERY 6 HOURS AS NEEDED FOR NAUSEA AND VOMITING.   pantoprazole (PROTONIX) 40 MG tablet Take 40 mg by mouth daily.   prednisoLONE acetate (PRED FORTE) 1 % ophthalmic suspension Place 4-5 drops into the left eye daily.   rosuvastatin (CRESTOR) 40 MG tablet TAKE 1 TABLET BY MOUTH EVERY DAY   senna-docusate (SENOKOT-S) 8.6-50 MG tablet Take 1 tablet by mouth at bedtime as needed for mild constipation or moderate constipation.   sertraline (ZOLOFT) 100 MG tablet Take 1.5 tablets (150 mg total) by mouth daily.   traMADol (ULTRAM) 50 MG tablet TAKE 1 TABLET EVERY 6 HOURS AS NEEDED FOR MODERATE PAIN  Taking 5 mg of Prednisone for 2 weeks.  She has Rx for a month.  No change to her MTX.  She feels she has gained 5 lbs since starting prednisone and having a lot of soft tissue edema--hands and legs.  Allergies  Allergen Reactions   Aspirin Other (See Comments)    Stomach bleeds    Past Medical History:  Diagnosis Date   Acute myocardial infarction of other lateral wall, initial episode of care    Acute myocardial infarction, unspecified site, initial episode of care    Acute osteomyelitis    Acute osteomyelitis of left foot (HCC) 02/15/2018   Acute renal failure superimposed on stage 3a chronic kidney disease (HCC)    Allergic rhinitis    Anemia    Anginal pain (HCC)    07/15/13- no chest pain in months"   Anxiety    Bipolar affective (HCC)    CAD (coronary artery disease) 07/2011   s/p DES mid and distal RCA with 50% LAD   CKD stage 3 due to type 1 diabetes mellitus (HCC) 02/21/2015   Closed fracture of distal end of left radius 04/22/2020   Diabetic foot ulcer associated with diabetes mellitus due to underlying condition (HCC) 09/26/2018   Diabetic gastroparesis associated with type 1 diabetes mellitus (HCC)    Diabetic peripheral  neuropathy associated with type 1 diabetes mellitus (HCC)    Diabetic retinopathy    Esophageal stenosis    Esophageal ulcer    Esophagitis    Gastroparesis    Genital herpes    Reportedly tested and documented by [facility]--rare occurrences   GERD (gastroesophageal reflux disease)    Heart murmur    History of stomach ulcers    Hypertension    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   [name] on 12/19/2012, "that's the only one I've had" (12/19/2012)   Migraines    Mixed diabetic hyperlipidemia associated with type 1 diabetes mellitus (HCC)    Nausea and vomiting 02/15/2018   Nausea vomiting and diarrhea 12/29/2012  Onychomycosis 02/07/2021   Orthopnoea    Paronychia of finger, left 02/07/2021   Pneumonia 2012   Polysubstance abuse ([place])    Crack cocaine--none since 2008, MJ, ETOH:  clean of all since 2008   Rectal bleeding 01/10/2021   Renal lithiasis    Bilateral   Rheumatoid arthritis ([place]) 12/15/2008   Qualifier: Diagnosis of   By: Radene Ou MD, [place]       Sebaceous cyst    Severe sepsis ([place]) 02/19/2022   Stroke ([place]) 2011   denies residual on 12/19/2012.  "Years ago"   Tinea cruris 05/13/2019   Type 1 diabetes mellitus with multiple complications ([place]) 26/83/4196   Centricity Description: GASTROPARESIS, DIABETIC  Qualifier: Diagnosis of   By: Radene Ou MD, [place]      Centricity Description: DIABETIC PERIPHERAL NEUROPATHY  Qualifier: Diagnosis of   By: Radene Ou MD, [place]      Diabetic Retinopathy     CKD     ASCAD       Unstable angina ([place])    Vitamin D deficiency    Past Surgical History:  Procedure Laterality Date   CORONARY ANGIOPLASTY WITH STENT PLACEMENT  01/20/2009   "2" (12/19/2012)   CORONARY ANGIOPLASTY WITH STENT PLACEMENT  12/19/2012   "2" (12/19/2012)   CORONARY STENT INTERVENTION N/A 08/13/2020   Procedure: CORONARY STENT INTERVENTION;  Surgeon: Nelva Bush, MD;  Location: [facility];  Service: Cardiovascular;  Laterality: N/A;   CYSTOSCOPY W/  URETERAL STENT PLACEMENT Left 02/19/2022   Procedure: CYSTOSCOPY WITH URETERAL STENT PLACEMENT;  Surgeon: Irine Seal, MD;  Location: [facility];  Service: Urology;  Laterality: Left;   CYSTOSCOPY/URETEROSCOPY/HOLMIUM LASER/STENT PLACEMENT Left 03/14/2022   Procedure: CYSTOSCOPY LEFT URETEROSCOPY/HOLMIUM LASER/STENT EXCHANGE;  Surgeon: Irine Seal, MD;  Location: [facility];  Service: Urology;  Laterality: Left;  1 HR FOR CASE   ESOPHAGOGASTRODUODENOSCOPY N/A 02/26/2015   Procedure: ESOPHAGOGASTRODUODENOSCOPY (EGD);  Surgeon: Teena Irani, MD;  Location[facility] ENDOSCOPY;  Service: Endoscopy;  Laterality: N/A;   EYE SURGERY     Multiple surgeries of both eyes, including unsuccessful corneal transplant, left eye:  last laser was 09/08/2015 of right eye.  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   GAS INSERTION Left 07/16/2013   Procedure: INSERTION OF GAS;  Surgeon: Adonis Brook, MD;  Location: [facility];  Service: Ophthalmology;  Laterality: Left;  SF6   GAS/FLUID EXCHANGE Left 07/30/2013   Procedure: GAS/FLUID EXCHANGE;  Surgeon: Adonis Brook, MD;  Location: [facility];  Service: Ophthalmology;  Laterality: Left;   INTRAVASCULAR PRESSURE WIRE/FFR STUDY N/A 08/13/2020   Procedure: INTRAVASCULAR PRESSURE WIRE/FFR STUDY;  Surgeon: Nelva Bush, MD;  Location: [facility];  Service: Cardiovascular;  Laterality: N/A;   IRRIGATION AND DEBRIDEMENT SEBACEOUS CYST Right 03/01/2011   "pointer" (12/19/2012)   LEFT HEART CATH AND CORONARY ANGIOGRAPHY N/A 08/13/2020   Procedure: LEFT HEART CATH AND CORONARY ANGIOGRAPHY;  Surgeon: Nelva Bush, MD;  Location: [facility];  Service: Cardiovascular;  Laterality: N/A;   LEFT HEART CATHETERIZATION WITH CORONARY ANGIOGRAM N/A 07/06/2011   Procedure: LEFT HEART CATHETERIZATION WITH CORONARY ANGIOGRAM;  Surgeon: Jettie Booze, MD;  Location: [facility] CATH LAB;  Service: Cardiovascular;  Laterality: N/A;  possible PCI   LEFT HEART CATHETERIZATION  WITH CORONARY ANGIOGRAM N/A 12/19/2012   Procedure: LEFT HEART CATHETERIZATION WITH CORONARY ANGIOGRAM;  Surgeon: Jettie Booze, MD;  Location: [facility] CATH LAB;  Service: Cardiovascular;  Laterality: N/A;   MEMBRANE PEEL Left 07/16/2013   Procedure: MEMBRANE PEEL;  Surgeon: Shade Flood, MD;  Location: [facility] OR;  Service: Ophthalmology;  Laterality: Left;   PARS PLANA VITRECTOMY Left 07/16/2013   Procedure: PARS PLANA VITRECTOMY WITH 23 GAUGE;  Surgeon: Shade Flood, MD;  Location: [facility] OR;  Service: Ophthalmology;  Laterality: Left;   PARS PLANA VITRECTOMY Left 07/30/2013   Procedure: PARS PLANA VITRECTOMY WITH 23 GAUGE WITH ENDOLASER;  Surgeon: Shade Flood, MD;  Location: [facility] OR;  Service: Ophthalmology;  Laterality: Left;  with endolaser   PERCUTANEOUS CORONARY STENT INTERVENTION (PCI-S) N/A 07/06/2011   Procedure: PERCUTANEOUS CORONARY STENT INTERVENTION (PCI-S);  Surgeon: Corky Crafts, MD;  Location: [facility] CATH LAB;  Service: Cardiovascular;  Laterality: N/A;   PHOTOCOAGULATION WITH LASER Left 07/16/2013   Procedure: PHOTOCOAGULATION WITH LASER;  Surgeon: Shade Flood, MD;  Location: [facility] OR;  Service: Ophthalmology;  Laterality: Left;  ENDOLASER   Family History  Problem Relation Age of Onset   Breast cancer Mother 46   Hypertension Father    Stomach cancer Sister 56       cause of death   Hypertension Sister    Lung cancer Sister 85       Cause of death   Cancer Sister 21       ? stomach cancer   Cancer Maternal Uncle        2 uncles died from colon cancer in 43s   Social History   Socioeconomic History   Marital status: Divorced    Spouse name: Not on file   Number of children: 1   Years of education: 12   Highest education level: [employer] school graduate  Occupational History   Occupation: Disabled   Tobacco Use   Smoking status: Every Day    Packs/day: 0.75    Years: 30.00    Total pack years: 22.50    Types: Cigarettes   Smokeless tobacco: Never   Tobacco comments:     Has been to classes--not sure if she wants to quit.  Changing to non menthol..  Chantix, patches, gum never helped.  9/19:  lot going on.  Nicotine nasal spray--makes her dream about using cocaine and so stopped.  Vaping Use   Vaping Use: Never used  Substance and Sexual Activity   Alcohol use: Yes    Comment: Rare.wine   Drug use: Not Currently    Types: "Crack" cocaine, Marijuana    Comment: 12/31/2012 " clean from crack.  Does still use edibles (marijuana) rarely   Sexual activity: Not Currently    Partners: Male    Birth control/protection: None  Other Topics Concern   Not on file  Social History Narrative   Has lived in [place] for most of life   Disabled   Previously went to [employer] school and worked as Conservation officer, nature.   Lives by herself near [place].   Divorced in 2015.    She is caring for a middle school aged girl, Arvilla Meres, also a patient here.  [name]'s father contacted [place] on social media and left daughter with her reportedly while he went to drug rehab, which reportedly is now not the case.  He finally signed custody over to Magdelyn.     Social Determinants of Health   Financial Resource Strain: Low Risk  (05/24/2022)   Overall Financial Resource Strain (CARDIA)    Difficulty of Paying Living Expenses: Not hard at all  Food Insecurity: No Food Insecurity (05/24/2022)   Hunger Vital Sign    Worried About Running Out of Food in the Last  Year: Never true    Ran Out of Food in the Last Year: Never true  Transportation Needs: No Transportation Needs (05/24/2022)   PRAPARE - Administrator, Civil Service (Medical): No    Lack of Transportation (Non-Medical): No  Physical Activity: Not on file  Stress: Not on file  Social Connections: Not on file  Intimate Partner Violence: Not At Risk (09/03/2020)   Humiliation, Afraid, Rape, and Kick questionnaire    Fear of Current or Ex-Partner: No    Emotionally Abused: No    Physically Abused: No    Sexually  Abused: No      Review of Systems  Eyes:  Negative for visual disturbance ([facility] this past year.).  Respiratory:  Negative for shortness of breath.   Cardiovascular:  Positive for leg swelling (occasional since kidney stone in August and since starting prednisone). Negative for chest pain and palpitations.  Gastrointestinal:  Positive for abdominal pain (where she injects insulin). Negative for blood in stool (No melena).  Genitourinary:  Negative for dysuria (recent hospitalization for ureteral stones.  No pain today and pink urine here, but was clear at home this morning.).       Pink urine with sample today.      Objective:   BP (!) 148/80 (BP Location: Right Arm, Patient Position: Sitting, Cuff Size: Normal)   Pulse 76   Resp 20   Ht 5' (1.524 m)   Wt 188 lb (85.3 kg)   LMP 01/04/2015   BMI 36.72 kg/m   Physical Exam HENT:     Head: Normocephalic and atraumatic.     Right Ear: Tympanic membrane, ear canal and external ear normal.     Left Ear: Tympanic membrane, ear canal and external ear normal.     Nose: Nose normal.     Mouth/Throat:     Mouth: Mucous membranes are moist.     Pharynx: Oropharynx is clear.  Eyes:     Comments: Left eye atrophic with thick white scarring overlying cornea.  Eyelids on left generally closed over eye.  Neck:     Thyroid: No thyroid mass or thyromegaly.  Cardiovascular:     Rate and Rhythm: Normal rate and regular rhythm.     Pulses:          Dorsalis pedis pulses are 2+ on the right side and 2+ on the left side.       Posterior tibial pulses are 2+ on the right side and 2+ on the left side.     Heart sounds: S1 normal and S2 normal. No murmur heard.    No friction rub. No S3 or S4 sounds.     Comments: No carotid bruits.  Carotid, radial, femoral, DP and PT pulses normal and equal.   Pulmonary:     [place]: Pulmonary effort is normal.     Breath sounds: Normal breath sounds and air entry.  Chest:  Breasts:    Right: No inverted  nipple, mass or nipple discharge.     Left: No inverted nipple, mass or nipple discharge.       Comments: Possible very small supernumerary nipples bilaterally.  Have not noted previously, but very subtle very circumscribed areolar like tissue and coloration. Abdominal:     General: Bowel sounds are normal.     Palpations: Abdomen is soft. There is no hepatomegaly, splenomegaly or mass.     Tenderness: There is no abdominal tenderness.     Hernia: No hernia is  present.     Comments: Possibly some mild bilateral flank tenderness--admits may have noted this for past week during exam.  Genitourinary:    Comments: Normal external female genitalia No uterine or adnexal mass or tenderness. Musculoskeletal:        General: Normal range of motion.     Cervical back: Normal range of motion and neck supple.     Right lower leg: No edema.     Left lower leg: No edema.       Feet:  Feet:     Right foot:     Protective Sensation: 10 sites tested.  10 sites sensed.     Skin integrity: Skin integrity normal.     Toenail Condition: Right toenails are normal.     Left foot:     Protective Sensation: 10 sites tested.  10 sites sensed.     Skin integrity: Skin integrity normal.     Toenail Condition: Left toenails are normal.     Comments: Right little toe overlapping 4th toe.  No interdigital inflammation or skin breakdown. Lymphadenopathy:     Head:     Right side of head: No submental or submandibular adenopathy.     Left side of head: No submental or submandibular adenopathy.     Cervical: No cervical adenopathy.     Upper Body:     Right upper body: No supraclavicular or axillary adenopathy.     Left upper body: No supraclavicular or axillary adenopathy.     Lower Body: No right inguinal adenopathy. No left inguinal adenopathy.  Skin:    General: Skin is warm.     Capillary Refill: Capillary refill takes less than 2 seconds.     Findings: No rash.  Neurological:     General: No focal  deficit present.     Mental Status: She is alert and oriented to person, place, and time.     Cranial Nerves: Cranial nerves 2-12 are intact.     Sensory: Sensation is intact.     Motor: Motor function is intact.     Coordination: Coordination is intact.     Gait: Gait is intact.     Deep Tendon Reflexes: Reflexes are normal and symmetric.  Psychiatric:        Speech: Speech normal.        Behavior: Behavior normal. Behavior is cooperative.      Assessment & Plan   CPE without pap Mammogram completed earlier in year and normal FIT to return in 2 weeks Second Shingrix Encouraged obtaining new COVID vaccine  2.  CKD:  CMP  3.  Hypertension:  up a bit today.  Recheck at follow up.  Generally controlled.  4.  DM:  historically when using drugs, poorly controlled.  Has been controlled for some time,but with multiple complications now.  Has had eye exam this year.    5.  Recent ureterolilhiasis with hydronephrosis:  hematuria.  Send urine for culture.  Cipro 500 mg twice daily for 5 days empirically.  6.  Hx of Vitamin D deficiency:  repeat level.  7.  Hyperlipidemia:  FLP  8.  Tobacco Abuse  Does not seem ready to consider quitting

## 2022-05-24 NOTE — Patient Instructions (Signed)
Drink 4 eight ounce cups or unsweetened almond milk daily.

## 2022-05-25 LAB — URINALYSIS, ROUTINE W REFLEX MICROSCOPIC
Bilirubin, UA: NEGATIVE
Glucose, UA: NEGATIVE
Ketones, UA: NEGATIVE
Nitrite, UA: NEGATIVE
Specific Gravity, UA: 1.008 (ref 1.005–1.030)
Urobilinogen, Ur: 0.2 mg/dL (ref 0.2–1.0)
pH, UA: 7.5 (ref 5.0–7.5)

## 2022-05-25 LAB — MICROSCOPIC EXAMINATION
Bacteria, UA: NONE SEEN
Casts: NONE SEEN /lpf
RBC, Urine: >30 /hpf — AB (ref 0–2)

## 2022-05-26 LAB — URINE CULTURE

## 2022-06-07 ENCOUNTER — Ambulatory Visit (INDEPENDENT_AMBULATORY_CARE_PROVIDER_SITE_OTHER): Payer: Medicaid Other

## 2022-06-07 VITALS — BP 140/64 | HR 72

## 2022-06-07 DIAGNOSIS — Z0130 Encounter for examination of blood pressure without abnormal findings: Secondary | ICD-10-CM | POA: Diagnosis not present

## 2022-06-08 MED ORDER — METOPROLOL TARTRATE 37.5 MG PO TABS: 25.0000 mg | ORAL_TABLET | Freq: Two times a day (BID) | ORAL | 11 refills | Status: DC

## 2022-06-08 NOTE — Progress Notes (Signed)
After reporting bp to Dr Delrae Alfred. Patient will take 37.5mg  of metoprolol tartrate twice daily. Patient has been scheduled for repeat bp and bmp in 2 weeks

## 2022-06-09 DIAGNOSIS — M2550 Pain in unspecified joint: Secondary | ICD-10-CM | POA: Diagnosis not present

## 2022-06-09 DIAGNOSIS — Z79899 Other long term (current) drug therapy: Secondary | ICD-10-CM | POA: Diagnosis not present

## 2022-06-09 DIAGNOSIS — M0609 Rheumatoid arthritis without rheumatoid factor, multiple sites: Secondary | ICD-10-CM | POA: Diagnosis not present

## 2022-06-10 LAB — COMPREHENSIVE METABOLIC PANEL
ALT: 8 IU/L (ref 0–32)
AST: 24 IU/L (ref 0–40)
Albumin/Globulin Ratio: 1.3 (ref 1.2–2.2)
Albumin: 4.4 g/dL (ref 3.8–4.9)
Alkaline Phosphatase: 179 IU/L — ABNORMAL HIGH (ref 44–121)
BUN/Creatinine Ratio: 16 (ref 9–23)
BUN: 20 mg/dL (ref 6–24)
Bilirubin Total: 0.5 mg/dL (ref 0.0–1.2)
CO2: 18 mmol/L — ABNORMAL LOW (ref 20–29)
Calcium: 10 mg/dL (ref 8.7–10.2)
Chloride: 101 mmol/L (ref 96–106)
Creatinine, Ser: 1.26 mg/dL — ABNORMAL HIGH (ref 0.57–1.00)
Globulin, Total: 3.5 g/dL (ref 1.5–4.5)
Glucose: 66 mg/dL — ABNORMAL LOW (ref 70–99)
Potassium: 3.6 mmol/L (ref 3.5–5.2)
Sodium: 141 mmol/L (ref 134–144)
Total Protein: 7.9 g/dL (ref 6.0–8.5)
eGFR: 50 mL/min/{1.73_m2} — ABNORMAL LOW (ref >59)

## 2022-06-10 LAB — CBC WITH DIFFERENTIAL/PLATELET
Basophils Absolute: 0 10*3/uL (ref 0.0–0.2)
Basos: 0 %
EOS (ABSOLUTE): 0.2 10*3/uL (ref 0.0–0.4)
Eos: 2 %
Hematocrit: 41.6 % (ref 34.0–46.6)
Hemoglobin: 13.8 g/dL (ref 11.1–15.9)
Immature Grans (Abs): 0 10*3/uL (ref 0.0–0.1)
Immature Granulocytes: 0 %
Lymphocytes Absolute: 1.6 10*3/uL (ref 0.7–3.1)
Lymphs: 20 %
MCH: 28.9 pg (ref 26.6–33.0)
MCHC: 33.2 g/dL (ref 31.5–35.7)
MCV: 87 fL (ref 79–97)
Monocytes Absolute: 0.4 10*3/uL (ref 0.1–0.9)
Monocytes: 5 %
Neutrophils Absolute: 5.9 10*3/uL (ref 1.4–7.0)
Neutrophils: 73 %
Platelets: 351 10*3/uL (ref 150–450)
RBC: 4.78 x10E6/uL (ref 3.77–5.28)
RDW: 15.8 % — ABNORMAL HIGH (ref 11.7–15.4)
WBC: 8.1 10*3/uL (ref 3.4–10.8)

## 2022-06-10 LAB — LIPID PANEL W/O CHOL/HDL RATIO
Cholesterol, Total: 202 mg/dL — ABNORMAL HIGH (ref 100–199)
HDL: 89 mg/dL (ref >39)
LDL Chol Calc (NIH): 96 mg/dL (ref 0–99)
Triglycerides: 95 mg/dL (ref 0–149)
VLDL Cholesterol Cal: 17 mg/dL (ref 5–40)

## 2022-06-10 LAB — VITAMIN D 25 HYDROXY (VIT D DEFICIENCY, FRACTURES): Vit D, 25-Hydroxy: 9.9 ng/mL — ABNORMAL LOW (ref 30.0–100.0)

## 2022-06-12 ENCOUNTER — Encounter: Payer: Self-pay | Admitting: Internal Medicine

## 2022-06-16 ENCOUNTER — Encounter: Payer: Self-pay | Admitting: Internal Medicine

## 2022-06-16 MED ORDER — VITAMIN D3 25 MCG (1000 UT) PO CAPS: 1000.0000 [IU] | ORAL_CAPSULE | Freq: Every day | ORAL | 0 refills | Status: DC

## 2022-06-16 NOTE — Telephone Encounter (Signed)
No notation needed.

## 2022-06-16 NOTE — Addendum Note (Signed)
Addended by: Marcene Duos on: 06/16/2022 05:05 PM   Modules accepted: Orders

## 2022-07-13 ENCOUNTER — Ambulatory Visit (INDEPENDENT_AMBULATORY_CARE_PROVIDER_SITE_OTHER): Payer: Medicaid Other | Admitting: Podiatry

## 2022-07-13 VITALS — BP 148/73 | HR 83

## 2022-07-13 DIAGNOSIS — M79674 Pain in right toe(s): Secondary | ICD-10-CM | POA: Diagnosis not present

## 2022-07-13 DIAGNOSIS — E1149 Type 2 diabetes mellitus with other diabetic neurological complication: Secondary | ICD-10-CM

## 2022-07-13 DIAGNOSIS — M79675 Pain in left toe(s): Secondary | ICD-10-CM | POA: Diagnosis not present

## 2022-07-13 DIAGNOSIS — B351 Tinea unguium: Secondary | ICD-10-CM

## 2022-07-13 DIAGNOSIS — L84 Corns and callosities: Secondary | ICD-10-CM

## 2022-07-13 MED ORDER — AMMONIUM LACTATE 12 % EX CREA: TOPICAL_CREAM | CUTANEOUS | 2 refills | Status: DC | PRN

## 2022-07-13 NOTE — Progress Notes (Signed)
Subjective: Chief Complaint  Patient presents with   Diabetes    Diabetic foot care, A1c- 7.0 BG- 134, Nail trim TX: (Amlactin refill Needed)   56 year old female presents the above concerns.  No open lesions.  PCP: Julieanne Manson, MD-last seen May 24, 2022 Endocrinologist: Dr. Talmage Coin, MD  Objective: Felicity Coyer 3, NAD DP/PT pulses palpable, CRT less than 3 seconds Sensation decreased with Semmes Weinstein monofilament. Nails hypertrophic, dystrophic, elongated, brittle, discolored 10. There is tenderness overlying the nails 1-5 bilaterally. There is no surrounding erythema or drainage along the nail sites.  There is hyperkeratotic tissue distal medial bilateral hallux without any underlying ulceration.   No open lesions Hammertoes present No pain with calf compression, swelling, warmth, erythema.  Assessment: Patient presents with symptomatic onychomycosis, hyperkeratotic lesions  Plan: -Treatment options including alternatives, risks, complications were discussed -Nails sharply debrided 10 without complication/bleeding. -Hyperkeratotic lesion sharply debrided x1 without any complications or bleeding. -Discussed daily foot inspection. If there are any changes, to call the office immediately.  -Follow-up in 3 months or sooner if any problems are to arise. In the meantime, encouraged to call the office with any questions, concerns, changes symptoms.  Ovid Curd, DPM

## 2022-07-27 ENCOUNTER — Encounter: Payer: Medicaid Other | Admitting: *Deleted

## 2022-07-27 VITALS — BP 100/48 | HR 77 | Temp 99.0°F | Resp 18

## 2022-07-27 DIAGNOSIS — Z006 Encounter for examination for normal comparison and control in clinical research program: Secondary | ICD-10-CM

## 2022-07-27 MED ORDER — STUDY - OCEAN(A) - OLPASIRAN (AMG 890) 142 MG/ML OR PLACEBO SQ INJECTION (PI-HILTY)
142.0000 mg | PREFILLED_SYRINGE | Freq: Once | SUBCUTANEOUS | Status: AC
  Administered 2022-07-27: 142 mg via SUBCUTANEOUS
  Filled 2022-07-27: qty 1

## 2022-07-27 NOTE — Research (Addendum)
Patient seen in clinic today for week  36 visit of [name](a) trial. Patient denies any adverse events since last visit. Reviewed medications with patient and no changes noted at this visit. All procedures and  Lab work completed per protocol and patient tolerated well without complaints. IP injection was given at 10:23 in the LLQ  without any complications noted.  Next visit Week 48 scheduled for 10/19/2022.    Basilio Cairo) Informed Consent   Subject Name: Islay Bun  Subject met inclusion and exclusion criteria.  The informed consent form, study requirements and expectations were reviewed with the subject and questions and concerns were addressed prior to the signing of the consent form.  The subject verbalized understanding of the trial requirements.  The subject agreed to participate in the ocean(a) trial and signed the informed consent at 10:00 on 07/27/2022.  The informed consent was obtained prior to performance of any protocol-specific procedures for the subject.  A copy of the signed informed consent was given to the subject and a copy was placed in the subject's medical record.     Subject re-consented to  Version: 5  Marylou Mccoy    Current Outpatient Medications:    acetaminophen (TYLENOL) 325 MG tablet, Take 2 tablets (650 mg total) by mouth every 6 (six) hours as needed for mild pain or fever., Disp: , Rfl:    ammonium lactate (AMLACTIN) 12 % cream, Apply topically as needed for dry skin., Disp: 385 g, Rfl: 2   Cholecalciferol (VITAMIN D3) 25 MCG (1000 UT) CAPS, Take 1 capsule (1,000 Units total) by mouth daily., Disp: 90 capsule, Rfl: 0   clopidogrel (PLAVIX) 75 MG tablet, TAKE 1 TABLET BY MOUTH EVERY DAY, Disp: 90 tablet, Rfl: 2   cyclobenzaprine (FLEXERIL) 10 MG tablet, Take 1-2 tablets (10-20 mg total) by mouth at bedtime as needed for muscle spasms. Muscle spasms, Disp: 30 tablet, Rfl: 0   folic acid (FOLVITE) 1 MG tablet, Take 1 mg by mouth daily., Disp: , Rfl:    furosemide  (LASIX) 40 MG tablet, TAKE 1 TABLET BY MOUTH EVERY DAY IN THE MORNING, Disp: 90 tablet, Rfl: 3   hydrOXYzine (ATARAX) 25 MG tablet, Take 25 mg by mouth 2 (two) times daily., Disp: , Rfl:    insulin aspart (NOVOLOG) 100 UNIT/ML injection, Inject 4-16 Units into the skin 3 (three) times daily with meals. Per Sliding scale CBG 100-150: 4 units, 151-200: 6 units, 201-250: 8 units, 251-300: 10 units, 301-350: 12 units, 351-400: 14 units, > 401; call doctor, Disp: , Rfl:    LANTUS SOLOSTAR 100 UNIT/ML Solostar Pen, Inject 12 Units into the skin 2 (two) times daily., Disp: , Rfl:    methotrexate (RHEUMATREX) 2.5 MG tablet, Take 10 mg by mouth every Wednesday., Disp: , Rfl:    metoprolol tartrate 37.5 MG TABS, Take 25 mg by mouth 2 (two) times daily., Disp: 30 tablet, Rfl: 11   nitroGLYCERIN (NITROSTAT) 0.4 MG SL tablet, PLACE 1 TABLET UNDER THE TONGUE EVERY 5 MINUTES AS NEEDED FOR CHEST PAIN. MAX 3 DOSES, CALL 911., Disp: 25 tablet, Rfl: 10   ondansetron (ZOFRAN) 4 MG tablet, 1 TAB BY MOUTH EVERY 6 HOURS AS NEEDED FOR NAUSEA AND VOMITING., Disp: 12 tablet, Rfl: 0   pantoprazole (PROTONIX) 40 MG tablet, Take 40 mg by mouth daily., Disp: , Rfl:    prednisoLONE acetate (PRED FORTE) 1 % ophthalmic suspension, Place 4-5 drops into the left eye daily., Disp: , Rfl: 6   rosuvastatin (CRESTOR) 40 MG  tablet, TAKE 1 TABLET BY MOUTH EVERY DAY, Disp: 90 tablet, Rfl: 3   sertraline (ZOLOFT) 100 MG tablet, Take 1.5 tablets (150 mg total) by mouth daily., Disp: , Rfl:    Study - [name](A) - olpasiran (AMG 890) 142 mg/mL or placebo SQ injection (PI-Hilty), Inject 142 mg into the skin once. For Investigational Use Only. Inject 1 mL (1 prefilled syringe) subcutaneously into appropriate injection site per protocol every 12 weeks in the clinic. (Approved injection site(s): upper arm, upper thigh & abdomen). Please contact [facility] with any questions or concerns regarding this medication., Disp: , Rfl:    traMADol  (ULTRAM) 50 MG tablet, TAKE 1 TABLET EVERY 6 HOURS AS NEEDED FOR MODERATE PAIN, Disp: 90 tablet, Rfl: 2   Accu-Chek FastClix Lancets MISC, , Disp: , Rfl:    albuterol (PROVENTIL HFA;VENTOLIN HFA) 108 (90 Base) MCG/ACT inhaler, Inhale 2 puffs into the lungs every 6 (six) hours as needed for wheezing or shortness of breath. (Patient not taking: Reported on 05/24/2022), Disp: 1 Inhaler, Rfl: 0   ampicillin (PRINCIPEN) 500 MG capsule, Take 1 capsule (500 mg total) by mouth 4 (four) times daily. Take 4 x daily for 3 days and then 1 po at bedtime with the rest of the script and the refills. (Patient not taking: Reported on 05/04/2022), Disp: 30 capsule, Rfl: 3   Blood Glucose Monitoring Suppl (ACCU-CHEK GUIDE ME) w/Device KIT, , Disp: , Rfl:    ciprofloxacin (CIPRO) 500 MG tablet, 1 tab by mouth twice daily for 5 days. (Patient not taking: Reported on 07/27/2022), Disp: 10 tablet, Rfl: 0   Continuous Blood Gluc Receiver (DEXCOM G6 RECEIVER) DEVI, AS DIRECTED 364 DAYS, Disp: , Rfl:    Continuous Blood Gluc Sensor (FREESTYLE LIBRE 2 SENSOR) MISC, Apply topically every other day., Disp: , Rfl:    Continuous Blood Gluc Transmit (DEXCOM G6 TRANSMITTER) MISC, AS DIRECTED 90 DAYS, Disp: , Rfl:    famotidine (PEPCID) 20 MG tablet, Take 20 mg by mouth 2 (two) times daily. (Patient not taking: Reported on 05/24/2022), Disp: , Rfl:    Incontinence Supply Disposable (POISE ULTRA THINS) PADS, Use 5 pads daily as needed for urinary stress incontinence, Disp: 150 each, Rfl: 11   polyethylene glycol (MIRALAX / GLYCOLAX) 17 g packet, Take 17 g by mouth daily. (Patient not taking: Reported on 05/24/2022), Disp: 14 each, Rfl: 0   senna-docusate (SENOKOT-S) 8.6-50 MG tablet, Take 1 tablet by mouth at bedtime as needed for mild constipation or moderate constipation. (Patient not taking: Reported on 07/27/2022), Disp: 30 tablet, Rfl: 0  Current Facility-Administered Medications:    Study - [name](A) - olpasiran (AMG 890) 142 mg/mL  or placebo SQ injection (PI-Hilty), 142 mg, Subcutaneous, Once, Hilty, Nadean Corwin, MD   [facility]) IP ADMINISTRATION Subject [phone] IP Box [id number] IP [id number] Time Given:10:23 Administration site:LLQ

## 2022-08-04 NOTE — Research (Cosign Needed)
[name](a) week 36 lab results:        Are there any labs that are clinically significant?  Yes [] OR No[x]  Kenneth C. Hilty, MD, FACC, FACP  [place]  [facility]  Medical Director of the [facility] Lip[facility] &  Cardiovascular Risk Reduction Clinic Diplomate of the American Board of Clinical Lipidology Attending Cardiologist  Direct Dial: [phone]  Fax: [phone]  Website:  [email or web address]    

## 2022-08-06 ENCOUNTER — Other Ambulatory Visit: Payer: Self-pay | Admitting: Internal Medicine

## 2022-08-07 ENCOUNTER — Telehealth: Payer: Self-pay

## 2022-08-07 ENCOUNTER — Other Ambulatory Visit: Payer: Self-pay

## 2022-08-07 MED ORDER — ONDANSETRON HCL 4 MG PO TABS: ORAL_TABLET | ORAL | 0 refills | Status: DC

## 2022-08-07 NOTE — Telephone Encounter (Signed)
Patient called asking for a refill on albuterol inhaler.

## 2022-08-11 ENCOUNTER — Other Ambulatory Visit: Payer: Self-pay

## 2022-08-11 DIAGNOSIS — I25119 Atherosclerotic heart disease of native coronary artery with unspecified angina pectoris: Secondary | ICD-10-CM

## 2022-08-11 MED ORDER — CLOPIDOGREL BISULFATE 75 MG PO TABS: 75.0000 mg | ORAL_TABLET | Freq: Every day | ORAL | 2 refills | Status: DC

## 2022-08-11 MED ORDER — NITROGLYCERIN 0.4 MG SL SUBL: SUBLINGUAL_TABLET | SUBLINGUAL | 2 refills | Status: DC

## 2022-08-18 MED ORDER — ALBUTEROL SULFATE HFA 108 (90 BASE) MCG/ACT IN AERS: 2.0000 | INHALATION_SPRAY | Freq: Four times a day (QID) | RESPIRATORY_TRACT | 0 refills | Status: DC | PRN

## 2022-08-23 ENCOUNTER — Encounter: Payer: Self-pay | Admitting: Internal Medicine

## 2022-08-23 ENCOUNTER — Ambulatory Visit (INDEPENDENT_AMBULATORY_CARE_PROVIDER_SITE_OTHER): Payer: Medicaid Other | Admitting: Internal Medicine

## 2022-08-23 ENCOUNTER — Other Ambulatory Visit: Payer: Self-pay

## 2022-08-23 VITALS — BP 138/78 | HR 72 | Resp 20 | Ht 60.0 in | Wt 193.0 lb

## 2022-08-23 DIAGNOSIS — E559 Vitamin D deficiency, unspecified: Secondary | ICD-10-CM

## 2022-08-23 DIAGNOSIS — Z720 Tobacco use: Secondary | ICD-10-CM | POA: Diagnosis not present

## 2022-08-23 DIAGNOSIS — Z639 Problem related to primary support group, unspecified: Secondary | ICD-10-CM | POA: Diagnosis not present

## 2022-08-23 DIAGNOSIS — M059 Rheumatoid arthritis with rheumatoid factor, unspecified: Secondary | ICD-10-CM

## 2022-08-23 DIAGNOSIS — F609 Personality disorder, unspecified: Secondary | ICD-10-CM

## 2022-08-23 DIAGNOSIS — E108 Type 1 diabetes mellitus with unspecified complications: Secondary | ICD-10-CM

## 2022-08-23 DIAGNOSIS — Z716 Tobacco abuse counseling: Secondary | ICD-10-CM

## 2022-08-23 DIAGNOSIS — H5442A5 Blindness left eye category 5, normal vision right eye: Secondary | ICD-10-CM

## 2022-08-23 MED ORDER — VITAMIN D3 25 MCG (1000 UT) PO CAPS: 1000.0000 [IU] | ORAL_CAPSULE | Freq: Every day | ORAL | 0 refills | Status: DC

## 2022-08-23 MED ORDER — PREDNISONE 5 MG PO TABS: 5.0000 mg | ORAL_TABLET | Freq: Every day | ORAL | 0 refills | Status: DC

## 2022-08-23 MED ORDER — CYCLOBENZAPRINE HCL 10 MG PO TABS: 10.0000 mg | ORAL_TABLET | Freq: Every evening | ORAL | 0 refills | Status: DC | PRN

## 2022-08-23 NOTE — Progress Notes (Unsigned)
Subjective:    Patient ID: [name] [name], female   DOB: 23-Mar-1966, 57 y.o.   MRN: [id number]   HPI  Needs paperwork done for new coverage with Medicaid. She has been released from [facility] as did not follow up since October.   She would like to start counseling with Gala Romney, LCSW-A  Current Meds  Medication Sig   acetaminophen (TYLENOL) 325 MG tablet Take 2 tablets (650 mg total) by mouth every 6 (six) hours as needed for mild pain or fever.   albuterol (VENTOLIN HFA) 108 (90 Base) MCG/ACT inhaler Inhale 2 puffs into the lungs every 6 (six) hours as needed for wheezing or shortness of breath.   ammonium lactate (AMLACTIN) 12 % cream Apply topically as needed for dry skin.   clopidogrel (PLAVIX) 75 MG tablet Take 1 tablet (75 mg total) by mouth daily.   Continuous Blood Gluc Receiver (DEXCOM G6 RECEIVER) DEVI AS DIRECTED 364 DAYS   Continuous Blood Gluc Transmit (DEXCOM G6 TRANSMITTER) MISC AS DIRECTED 90 DAYS   cyclobenzaprine (FLEXERIL) 10 MG tablet Take 1-2 tablets (10-20 mg total) by mouth at bedtime as needed for muscle spasms. Muscle spasms   famotidine (PEPCID) 20 MG tablet Take 20 mg by mouth 2 (two) times daily. Taking 1 tab twice daily only as needed generally once monthly   folic acid (FOLVITE) 1 MG tablet Take 1 mg by mouth daily.   furosemide (LASIX) 40 MG tablet TAKE 1 TABLET BY MOUTH EVERY DAY IN THE MORNING   hydrOXYzine (ATARAX) 25 MG tablet Take 25 mg by mouth 2 (two) times daily.   Incontinence Supply Disposable (POISE ULTRA THINS) PADS Use 5 pads daily as needed for urinary stress incontinence   insulin aspart (NOVOLOG) 100 UNIT/ML injection Inject 3 Units into the skin 3 (three) times daily with meals. Per Sliding scale CBG 100-150: 4 units, 151-200: 6 units, 201-250: 8 units, 251-300: 10 units, 301-350: 12 units, 351-400: 14 units, > 401; call doctor   LANTUS SOLOSTAR 100 UNIT/ML Solostar Pen Inject 12 Units into the skin 2 (two) times daily.  (Patient taking differently: Inject 8 Units into the skin 2 (two) times daily.)   methotrexate (RHEUMATREX) 2.5 MG tablet Take 10 mg by mouth every Wednesday.   metoprolol tartrate 37.5 MG TABS Take 25 mg by mouth 2 (two) times daily.   nitroGLYCERIN (NITROSTAT) 0.4 MG SL tablet PLACE 1 TABLET UNDER THE TONGUE EVERY 5 MINUTES AS NEEDED FOR CHEST PAIN. MAX 3 DOSES, CALL 911.   ondansetron (ZOFRAN) 4 MG tablet 1 TAB BY MOUTH EVERY 6 HOURS AS NEEDED FOR NAUSEA AND VOMITING.   pantoprazole (PROTONIX) 40 MG tablet Take 40 mg by mouth daily.   prednisoLONE acetate (PRED FORTE) 1 % ophthalmic suspension Place 4-5 drops into the left eye daily.   rosuvastatin (CRESTOR) 40 MG tablet TAKE 1 TABLET BY MOUTH EVERY DAY   sertraline (ZOLOFT) 100 MG tablet 1 1/2 TABS BY MOUTH DAILY (Patient taking differently: 1 tab by mouth daily)   Study - [name](A) - olpasiran (AMG 890) 142 mg/mL or placebo SQ injection (PI-Hilty) Inject 142 mg into the skin once. For Investigational Use Only. Inject 1 mL (1 prefilled syringe) subcutaneously into appropriate injection site per protocol every 12 weeks in the clinic. (Approved injection site(s): upper arm, upper thigh & abdomen). Please contact [facility] with any questions or concerns regarding this medication.   traMADol (ULTRAM) 50 MG tablet TAKE 1 TABLET EVERY 6 HOURS AS NEEDED  FOR MODERATE PAIN   [DISCONTINUED] Continuous Blood Gluc Sensor (FREESTYLE LIBRE 2 SENSOR) MISC Apply topically every other day.      Allergies  Allergen Reactions   Aspirin Other (See Comments)    Stomach bleeds      Review of Systems    Objective:   BP 138/78 (BP Location: Right Arm, Patient Position: Sitting, Cuff Size: Normal)   Pulse 72   Resp 20   Ht 5' (1.524 m)   Wt 193 lb (87.5 kg)   LMP 01/04/2015   BMI 37.69 kg/m   Physical Exam   Assessment & Plan   ***

## 2022-08-23 NOTE — Patient Instructions (Signed)
Get [phone] COVID booster at pharmacy

## 2022-09-03 ENCOUNTER — Other Ambulatory Visit: Payer: Self-pay | Admitting: Internal Medicine

## 2022-09-15 ENCOUNTER — Other Ambulatory Visit: Payer: Self-pay | Admitting: Internal Medicine

## 2022-09-19 ENCOUNTER — Other Ambulatory Visit: Payer: Self-pay | Admitting: Internal Medicine

## 2022-09-20 ENCOUNTER — Other Ambulatory Visit: Payer: Self-pay | Admitting: Internal Medicine

## 2022-09-26 ENCOUNTER — Other Ambulatory Visit: Payer: Medicaid Other

## 2022-10-01 ENCOUNTER — Other Ambulatory Visit: Payer: Self-pay | Admitting: Internal Medicine

## 2022-10-05 ENCOUNTER — Ambulatory Visit (INDEPENDENT_AMBULATORY_CARE_PROVIDER_SITE_OTHER): Payer: Medicaid Other | Admitting: Psychology

## 2022-10-05 DIAGNOSIS — F418 Other specified anxiety disorders: Secondary | ICD-10-CM

## 2022-10-12 ENCOUNTER — Ambulatory Visit: Payer: Medicaid Other | Admitting: Podiatry

## 2022-10-18 ENCOUNTER — Telehealth: Payer: Self-pay

## 2022-10-18 NOTE — Telephone Encounter (Signed)
Tried to call pt to remind her of her appointment with research tomorrow. No answer, LMOM with instructions and parking code. Advised her that her appt was at 8:30am but if she could come in at 9:00 instead that would be helpful. Asked her to call back if she could not make this appt.

## 2022-10-19 ENCOUNTER — Encounter: Payer: Medicaid Other | Admitting: *Deleted

## 2022-10-19 VITALS — BP 100/50 | HR 74 | Temp 98.3°F | Wt 196.6 lb

## 2022-10-19 DIAGNOSIS — Z006 Encounter for examination for normal comparison and control in clinical research program: Secondary | ICD-10-CM

## 2022-10-19 MED ORDER — STUDY - OCEAN(A) - OLPASIRAN (AMG 890) 142 MG/ML OR PLACEBO SQ INJECTION (PI-HILTY)
142.0000 mg | PREFILLED_SYRINGE | Freq: Once | SUBCUTANEOUS | Status: AC
  Administered 2022-10-19: 142 mg via SUBCUTANEOUS
  Filled 2022-10-19: qty 1

## 2022-10-19 NOTE — Progress Notes (Signed)
Patient seen today in follow up.  She has been doing well overall, but has notice perhaps slightly more chest discomfort. She saw a GI physician, and sometimes takes an Copywriter, advertising.  Her Pantaprozole was doubled and she is better.  She felt that this was different from any of her cardiac symptoms.  She does continue to smoke.  She is scheduled to see Dr. Irish Lack on April 4.   Alert, oriented female in NAD VS   P          Bp              R               T Lungs clear Cor regular Abd soft No edema No focal neurological changes  ECG - NSR. WNL  Impression   Week 30 [name] trial - stable  CAD  Continued tobacco use     Plan   Continue trial  Scheduled to see Dr. Irish Lack April 4  Continue follow with Dr. Amil Amen

## 2022-10-19 NOTE — Research (Signed)
[name](a) week 48  Patient seen in clinic today for week  48 visit of [name](a) trial. Patient denies any adverse events since last visit. Reviewed medications with patient and no changes noted at this visit. All procedures and  Lab work completed per protocol and patient tolerated well without complaints. IP injection was given at 10:10 in the LUQ without any complications noted.  Next visit Week 60 scheduled for 01/01/2023.   PK drawn at 11:45.      [name](a) IP ADMINISTRATION Subject [id number] IP Box [id number] IP [id number] Time Given:10:10 Administration site: LUQ   Current Outpatient Medications:    acetaminophen (TYLENOL) 325 MG tablet, Take 2 tablets (650 mg total) by mouth every 6 (six) hours as needed for mild pain or fever., Disp: , Rfl:    albuterol (VENTOLIN HFA) 108 (90 Base) MCG/ACT inhaler, TAKE 2 PUFFS BY MOUTH EVERY 6 HOURS AS NEEDED FOR WHEEZE OR SHORTNESS OF BREATH, Disp: 18 each, Rfl: 1   ammonium lactate (AMLACTIN) 12 % cream, Apply topically as needed for dry skin., Disp: 385 g, Rfl: 2   clopidogrel (PLAVIX) 75 MG tablet, Take 1 tablet (75 mg total) by mouth daily., Disp: 90 tablet, Rfl: 2   [employer] D3 25 MCG (1000 UT) capsule, TAKE 1 CAPSULE BY MOUTH EVERY DAY, Disp: 90 capsule, Rfl: 0   cyclobenzaprine (FLEXERIL) 10 MG tablet, TAKE 1-2 TABLETS (10-20 MG TOTAL) BY MOUTH AT BEDTIME AS NEEDED FOR MUSCLE SPASMS. MUSCLE SPASMS, Disp: 30 tablet, Rfl: 0   ENBREL SURECLICK 50 MG/ML injection, Inject into the skin., Disp: , Rfl:    famotidine (PEPCID) 20 MG tablet, Take 20 mg by mouth 2 (two) times daily. Taking 1 tab twice daily only as needed generally once monthly, Disp: , Rfl:    folic acid (FOLVITE) 1 MG tablet, Take 1 mg by mouth daily., Disp: , Rfl:    furosemide (LASIX) 40 MG tablet, TAKE 1 TABLET BY MOUTH EVERY DAY IN THE MORNING, Disp: 90 tablet, Rfl: 3   hydrOXYzine (ATARAX) 25 MG tablet, Take 25 mg by mouth 2 (two) times daily., Disp: , Rfl:    insulin aspart  (NOVOLOG) 100 UNIT/ML injection, Inject 3 Units into the skin 3 (three) times daily with meals. Per Sliding scale CBG 100-150: 4 units, 151-200: 6 units, 201-250: 8 units, 251-300: 10 units, 301-350: 12 units, 351-400: 14 units, > 401; call doctor, Disp: , Rfl:    LANTUS SOLOSTAR 100 UNIT/ML Solostar Pen, Inject 12 Units into the skin 2 (two) times daily. (Patient taking differently: Inject 8 Units into the skin 2 (two) times daily.), Disp: , Rfl:    methotrexate (RHEUMATREX) 2.5 MG tablet, Take 10 mg by mouth every Wednesday., Disp: , Rfl:    metoprolol tartrate 37.5 MG TABS, Take 25 mg by mouth 2 (two) times daily., Disp: 30 tablet, Rfl: 11   nitroGLYCERIN (NITROSTAT) 0.4 MG SL tablet, PLACE 1 TABLET UNDER THE TONGUE EVERY 5 MINUTES AS NEEDED FOR CHEST PAIN. MAX 3 DOSES, CALL 911., Disp: 75 tablet, Rfl: 2   ondansetron (ZOFRAN) 4 MG tablet, 1 TAB BY MOUTH EVERY 6 HOURS AS NEEDED FOR NAUSEA AND VOMITING., Disp: 12 tablet, Rfl: 0   pantoprazole (PROTONIX) 40 MG tablet, Take 40 mg by mouth daily., Disp: , Rfl:    Pramlintide Acetate 1500 MCG/1.5ML SOPN, Inject 15 mcg into the skin 3 (three) times daily before meals., Disp: , Rfl:    prednisoLONE acetate (PRED FORTE) 1 % ophthalmic suspension, Place 4-5 drops into the left  eye daily., Disp: , Rfl: 6   predniSONE (DELTASONE) 5 MG tablet, Take 1 tablet (5 mg total) by mouth daily with breakfast., Disp: 30 tablet, Rfl: 0   rosuvastatin (CRESTOR) 40 MG tablet, TAKE 1 TABLET BY MOUTH EVERY DAY, Disp: 90 tablet, Rfl: 3   sertraline (ZOLOFT) 100 MG tablet, 1 1/2 TABS BY MOUTH DAILY (Patient taking differently: 1 tab by mouth daily), Disp: 135 tablet, Rfl: 3   Study - [name](A) - olpasiran (AMG 890) 142 mg/mL or placebo SQ injection (PI-Hilty), Inject 142 mg into the skin once. For Investigational Use Only. Inject 1 mL (1 prefilled syringe) subcutaneously into appropriate injection site per protocol every 12 weeks in the clinic. (Approved injection site(s): upper  arm, upper thigh & abdomen). Please contact [facility] with any questions or concerns regarding this medication., Disp: , Rfl:    traMADol (ULTRAM) 50 MG tablet, TAKE 1 TABLET EVERY 6 HOURS AS NEEDED FOR MODERATE PAIN, Disp: 90 tablet, Rfl: 2   Continuous Blood Gluc Receiver (DEXCOM G6 RECEIVER) DEVI, AS DIRECTED 364 DAYS, Disp: , Rfl:    Continuous Blood Gluc Transmit (DEXCOM G6 TRANSMITTER) MISC, AS DIRECTED 90 DAYS, Disp: , Rfl:    Incontinence Supply Disposable (POISE ULTRA THINS) PADS, Use 5 pads daily as needed for urinary stress incontinence, Disp: 150 each, Rfl: 11  Current Facility-Administered Medications:    Study - [name](A) - olpasiran (AMG 890) 142 mg/mL or placebo SQ injection (PI-Hilty), 142 mg, Subcutaneous, Once, Hilty, Nadean Corwin, MD
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
## 2022-10-25 NOTE — Research (Addendum)
[name](a) week 48 lab results:             Are there any labs that are clinically significant?  Yes [] OR No[x]  Kenneth C. Hilty, MD, FACC, FACP  [place]  [facility]  Medical Director of the Advanced Lipid [facility] &  Cardiovascular Risk Reduction Clinic Diplomate of the American Board of Clinical Lipidology Attending Cardiologist  Direct Dial: [phone]  Fax: [phone]  Website:  [email or web address]  

## 2022-10-30 ENCOUNTER — Ambulatory Visit (INDEPENDENT_AMBULATORY_CARE_PROVIDER_SITE_OTHER): Payer: Medicaid Other | Admitting: Podiatry

## 2022-10-30 DIAGNOSIS — M79675 Pain in left toe(s): Secondary | ICD-10-CM | POA: Diagnosis not present

## 2022-10-30 DIAGNOSIS — M79674 Pain in right toe(s): Secondary | ICD-10-CM

## 2022-10-30 DIAGNOSIS — E1149 Type 2 diabetes mellitus with other diabetic neurological complication: Secondary | ICD-10-CM

## 2022-10-30 DIAGNOSIS — B351 Tinea unguium: Secondary | ICD-10-CM | POA: Diagnosis not present

## 2022-10-30 MED ORDER — CICLOPIROX 8 % EX SOLN: Freq: Every day | CUTANEOUS | 0 refills | Status: DC

## 2022-10-30 NOTE — Progress Notes (Unsigned)
Subjective: Chief Complaint  Patient presents with   diabetic foot care     57 year old female presents the above concerns.  No open lesions.  Symptoms are thickened elongated she has difficulty trimming himself and causing pain.  PCP: Mack Hook, MD-last seen May 24, 2022 Endocrinologist: Dr. Delrae Rend, MD  Objective: [name] [name] 3, NAD DP/PT pulses palpable, CRT less than 3 seconds Sensation decreased with Semmes Weinstein monofilament. Nails hypertrophic, dystrophic, elongated, brittle, discolored 10. There is tenderness overlying the nails 1-5 bilaterally. There is no surrounding erythema or drainage along the nail sites. Calluses much improved since using the AmLactin. No open lesions Hammertoes present No pain with calf compression, swelling, warmth, erythema.  Assessment: Patient presents with symptomatic onychomycosis, hyperkeratotic lesions  Plan: -Treatment options including alternatives, risks, complications were discussed -Nails sharply debrided 10 without complication/bleeding. -Hyperkeratotic lesion sharply debrided x1 without any complications or bleeding.  Paresthesias is quite minimal.  Refilled AmLactin. -Discussed daily foot inspection. If there are any changes, to call the office immediately.  -Follow-up in 3 months or sooner if any problems are to arise. In the meantime, encouraged to call the office with any questions, concerns, changes symptoms.  Celesta Gentile, DPM

## 2022-10-31 MED ORDER — AMMONIUM LACTATE 12 % EX CREA: TOPICAL_CREAM | CUTANEOUS | 2 refills | Status: DC | PRN

## 2022-10-31 NOTE — Progress Notes (Unsigned)
Cardiology Office Note   Date:  11/02/2022   ID:  [name] [name], DOB 06-Dec-1965, MRN [id number]  PCP:  Mack Hook, MD    No chief complaint on file.  CAD/Old MI  Wt Readings from Last 3 Encounters:  11/02/22 193 lb 6.4 oz (87.7 kg)  10/19/22 196 lb 9.6 oz (89.2 kg)  08/23/22 193 lb (87.5 kg)       History of Present Illness: [name] [name] is a 57 y.o. 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 She had a non-STEMI in May of 2014 and had a stent placed in her circumflex. This was a drug-eluting stent. She had several eye surgeries in late 2012-11-24 and early 2013/11/24.   She had bleeding issues at that time and her Plavix was stopped before she completed her 81 month course post MI ( May 2014).  She had not been on aspirin due to allergy.  Her vision in her left eye is severely decreased.     Her sister passed away from cancer in November 24, 2016 and she went through menopause.  She had visual issues due to bleeding after eye surgery.  She mentioned to me that she was involved in a lawsuit with the ophthalmologist, but she states that she had to stop the case due to signing consent.  She did call the medical board about this MD.     She had chronic chest pain noted in 11/25/2018, and 2019/11/25 after she fel off the bed.    Further chest pain led to cath in November 24, 2020: "Multivessel coronary artery disease, including sequential 60-70% and 20-30% mid LAD stenoses (DFR significant @ 0.85), 40% mid LCx lesion, and chronic total occlusion of mid/distal RCA.  The distal RCA branches fill via faint left-to-right collaterals. Widely patent mid/distal LCx stent. Severe in-stent restenosis of RCA with 70% proximal disease and chronic total occlusion of the mid and distal segments. Normal left ventricular filling pressure. Successful DFR-guided PCI to mid LAD using Resolute Onyx 2.75 x 26 mm drug eluting stent (postdilated to 3.1 mm) with 10% residual stenosis and TIMI-3 flow.    Recommendations: Dual antiplatelet therapy with aspirin and clopidogrel for up to 1 month (if tolerated) followed by indefinite clopidogrel 75 mg daily. Aggressive secondary prevention of coronary artery disease."   Intolerant of nicotine patches and lozenges. Intolerant to aspirin due to stomach issues.   He was given okay to hold Plavix for 5 days for colonoscopy and EGD in 08/2021 & facet injection.   Admitted 01/2022 for sepsis 2nd to infected and obstructing left ureteral calculus/left hydronephrosis and hydroureter with urinary obstruction. Patient underwent cystoscopy and left ureteral stent placement by Dr. Alfonse Spruce on 02/19/2022.   Now plan for left ureteroscopy with stent extraction.  Has some trouble with food getting stuck in esophagus.  Improved with twice a day protonix.  Denies : exertional Chest pain. Dizziness. Leg edema. Nitroglycerin use. Orthopnea. Palpitations. Paroxysmal nocturnal dyspnea. Shortness of breath. Syncope.    Has gained weight.  Past Medical History:  Diagnosis Date   Acute myocardial infarction of other lateral wall, initial episode of care    Acute myocardial infarction, unspecified site, initial episode of care    Acute osteomyelitis    Acute osteomyelitis of left foot 02/15/2018   Acute renal failure superimposed on stage 3a chronic kidney disease    Allergic rhinitis    Anemia    Anginal pain    07/15/13- no chest pain  in months"   Anxiety    Bipolar affective    CAD (coronary artery disease) 07/2011   s/p DES mid and distal RCA with 50% LAD   CKD stage 3 due to type 1 diabetes mellitus 02/21/2015   Closed fracture of distal end of left radius 04/22/2020   Diabetic foot ulcer associated with diabetes mellitus due to underlying condition 09/26/2018   Diabetic gastroparesis associated with type 1 diabetes mellitus    Diabetic peripheral neuropathy associated with type 1 diabetes mellitus    Diabetic retinopathy    Esophageal stenosis     Esophageal ulcer    Esophagitis    Gastroparesis    Genital herpes    Reportedly tested and documented by [facility]--rare occurrences   GERD (gastroesophageal reflux disease)    Heart murmur    History of stomach ulcers    Hypertension    Inferior MI 01/20/2009   Archie Endo on 12/19/2012, "that's the only one I've had" (12/19/2012)   Migraines    Mixed diabetic hyperlipidemia associated with type 1 diabetes mellitus    Nausea and vomiting 02/15/2018   Nausea vomiting and diarrhea 12/29/2012   Onychomycosis 02/07/2021   Orthopnoea    Paronychia of finger, left 02/07/2021   Pneumonia 2012   Polysubstance abuse    Crack cocaine--none since 2008, MJ, ETOH:  clean of all since 2008   Rectal bleeding 01/10/2021   Renal lithiasis    Bilateral   Rheumatoid arthritis 12/15/2008   Qualifier: Diagnosis of   By: Radene Ou MD, [place]       Sebaceous cyst    Severe sepsis 02/19/2022   Stroke 2011   denies residual on 12/19/2012.  "Years ago"   Tinea cruris 05/13/2019   Type 1 diabetes mellitus with multiple complications [id number]   Centricity Description: GASTROPARESIS, DIABETIC  Qualifier: Diagnosis of   By: Radene Ou MD, [place]      Centricity Description: DIABETIC PERIPHERAL NEUROPATHY  Qualifier: Diagnosis of   By: Radene Ou MD, [place]      Diabetic Retinopathy     CKD     ASCAD       Unstable angina    Vitamin D deficiency     Past Surgical History:  Procedure Laterality Date   CORONARY ANGIOPLASTY WITH STENT PLACEMENT  01/20/2009   "2" (12/19/2012)   CORONARY ANGIOPLASTY WITH STENT PLACEMENT  12/19/2012   "2" (12/19/2012)   CORONARY PRESSURE/FFR STUDY N/A 08/13/2020   Procedure: INTRAVASCULAR PRESSURE WIRE/FFR STUDY;  Surgeon: Nelva Bush, MD;  Location: [place] [facility];  Service: Cardiovascular;  Laterality: N/A;   CORONARY STENT INTERVENTION N/A 08/13/2020   Procedure: CORONARY STENT INTERVENTION;  Surgeon: Nelva Bush, MD;  Location: [place] [facility];  Service:  Cardiovascular;  Laterality: N/A;   CYSTOSCOPY W/ URETERAL STENT PLACEMENT Left 02/19/2022   Procedure: CYSTOSCOPY WITH URETERAL STENT PLACEMENT;  Surgeon: Irine Seal, MD;  Location: [facility];  Service: Urology;  Laterality: Left;   CYSTOSCOPY/URETEROSCOPY/HOLMIUM LASER/STENT PLACEMENT Left 03/14/2022   Procedure: CYSTOSCOPY LEFT URETEROSCOPY/HOLMIUM LASER/STENT EXCHANGE;  Surgeon: Irine Seal, MD;  Location: [facility];  Service: Urology;  Laterality: Left;  1 HR FOR CASE   ESOPHAGOGASTRODUODENOSCOPY N/A 02/26/2015   Procedure: ESOPHAGOGASTRODUODENOSCOPY (EGD);  Surgeon: Teena Irani, MD;  Location: [facility];  Service: Endoscopy;  Laterality: N/A;   EYE SURGERY     Multiple surgeries of both eyes, including unsuccessful corneal transplant, left eye:  last laser was 09/08/2015 of right eye.  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  by 2 Ophthos   GAS INSERTION Left 07/16/2013   Procedure: INSERTION OF GAS;  Surgeon: Adonis Brook, MD;  Location: [facility];  Service: Ophthalmology;  Laterality: Left;  SF6   GAS/FLUID EXCHANGE Left 07/30/2013   Procedure: GAS/FLUID EXCHANGE;  Surgeon: Adonis Brook, MD;  Location: [place];  Service: Ophthalmology;  Laterality: Left;   IRRIGATION AND DEBRIDEMENT SEBACEOUS CYST Right 03/01/2011   "pointer" (12/19/2012)   LEFT HEART CATH AND CORONARY ANGIOGRAPHY N/A 08/13/2020   Procedure: LEFT HEART CATH AND CORONARY ANGIOGRAPHY;  Surgeon: Nelva Bush, MD;  Location: [place] [facility];  Service: Cardiovascular;  Laterality: N/A;   LEFT HEART CATHETERIZATION WITH CORONARY ANGIOGRAM N/A 07/06/2011   Procedure: LEFT HEART CATHETERIZATION WITH CORONARY ANGIOGRAM;  Surgeon: Jettie Booze, MD;  Location: [facility];  Service: Cardiovascular;  Laterality: N/A;  possible PCI   LEFT HEART CATHETERIZATION WITH CORONARY ANGIOGRAM N/A 12/19/2012   Procedure: LEFT HEART CATHETERIZATION WITH CORONARY ANGIOGRAM;  Surgeon: Jettie Booze, MD;  Location: [facility] C[facility] LAB;   Service: Cardiovascular;  Laterality: N/A;   MEMBRANE PEEL Left 07/16/2013   Procedure: MEMBRANE PEEL;  Surgeon: Adonis Brook, MD;  Location: [place];  Service: Ophthalmology;  Laterality: Left;   PARS PLANA VITRECTOMY Left 07/16/2013   Procedure: PARS PLANA VITRECTOMY WITH 23 GAUGE;  Surgeon: Adonis Brook, MD;  Location: [place];  Service: Ophthalmology;  Laterality: Left;   PARS PLANA VITRECTOMY Left 07/30/2013   Procedure: PARS PLANA VITRECTOMY WITH 23 GAUGE WITH ENDOLASER;  Surgeon: Adonis Brook, MD;  Location: [place];  Service: Ophthalmology;  Laterality: Left;  with endolaser   PERCUTANEOUS CORONARY STENT INTERVENTION (PCI-S) N/A 07/06/2011   Procedure: PERCUTANEOUS CORONARY STENT INTERVENTION (PCI-S);  Surgeon: Jettie Booze, MD;  Location: [facility] CATH LAB;  Service: Cardiovascular;  Laterality: N/A;   PHOTOCOAGULATION WITH LASER Left 07/16/2013   Procedure: PHOTOCOAGULATION WITH LASER;  Surgeon: Adonis Brook, MD;  Location: [place];  Service: Ophthalmology;  Laterality: Left;  ENDOLASER     Current Outpatient Medications  Medication Sig Dispense Refill   acetaminophen (TYLENOL) 325 MG tablet Take 2 tablets (650 mg total) by mouth every 6 (six) hours as needed for mild pain or fever.     albuterol (VENTOLIN HFA) 108 (90 Base) MCG/ACT inhaler TAKE 2 PUFFS BY MOUTH EVERY 6 HOURS AS NEEDED FOR WHEEZE OR SHORTNESS OF BREATH 18 each 1   ammonium lactate (AMLACTIN) 12 % cream Apply topically as needed for dry skin. 385 g 2   clopidogrel (PLAVIX) 75 MG tablet Take 1 tablet (75 mg total) by mouth daily. 90 tablet 2   Continuous Blood Gluc Receiver (DEXCOM G6 RECEIVER) DEVI AS DIRECTED 364 DAYS     Continuous Blood Gluc Sensor (DEXCOM G6 SENSOR) MISC SMARTSIG:Topical Once a Month     Continuous Blood Gluc Transmit (DEXCOM G6 TRANSMITTER) MISC AS DIRECTED 90 DAYS     [employer] D3 25 MCG (1000 UT) capsule TAKE 1 CAPSULE BY MOUTH EVERY DAY 90 capsule 0   cyclobenzaprine (FLEXERIL) 10 MG tablet TAKE 1-2  TABLETS (10-20 MG TOTAL) BY MOUTH AT BEDTIME AS NEEDED FOR MUSCLE SPASMS. MUSCLE SPASMS 30 tablet 0   diphenhydrAMINE (BENADRYL) 25 MG tablet Take by mouth.     ENBREL SURECLICK 50 MG/ML injection Inject into the skin.     folic acid (FOLVITE) 1 MG tablet Take 1 mg by mouth daily.     furosemide (LASIX) 40 MG tablet TAKE 1 TABLET BY MOUTH EVERY DAY IN THE MORNING 90  tablet 3   gabapentin (NEURONTIN) 100 MG capsule Take 1 capsule by mouth at bedtime.     hydrOXYzine (ATARAX) 25 MG tablet Take 25 mg by mouth 2 (two) times daily.     Incontinence Supply Disposable (POISE ULTRA THINS) PADS Use 5 pads daily as needed for urinary stress incontinence 150 each 11   insulin aspart (NOVOLOG) 100 UNIT/ML injection Inject 3 Units into the skin 3 (three) times daily with meals. Per Sliding scale CBG 100-150: 4 units, 151-200: 6 units, 201-250: 8 units, 251-300: 10 units, 301-350: 12 units, 351-400: 14 units, > 401; call doctor     LANTUS SOLOSTAR 100 UNIT/ML Solostar Pen Inject 12 Units into the skin 2 (two) times daily. (Patient taking differently: Inject 8 Units into the skin 2 (two) times daily.)     methotrexate (RHEUMATREX) 2.5 MG tablet Take 10 mg by mouth every Wednesday.     metoprolol tartrate 37.5 MG TABS Take 25 mg by mouth 2 (two) times daily. 30 tablet 11   ondansetron (ZOFRAN) 4 MG tablet 1 TAB BY MOUTH EVERY 6 HOURS AS NEEDED FOR NAUSEA AND VOMITING. 12 tablet 0   pantoprazole (PROTONIX) 40 MG tablet Take 40 mg by mouth 2 (two) times daily.     prednisoLONE acetate (PRED FORTE) 1 % ophthalmic suspension Place 4-5 drops into the left eye daily.  6   rosuvastatin (CRESTOR) 40 MG tablet TAKE 1 TABLET BY MOUTH EVERY DAY 90 tablet 3   sertraline (ZOLOFT) 100 MG tablet 1 1/2 TABS BY MOUTH DAILY (Patient taking differently: 1 tab by mouth daily) 135 tablet 3   Study - [name](A) - olpasiran (AMG 890) 142 mg/mL or placebo SQ injection (PI-Hilty) Inject 142 mg into the skin once. For Investigational Use  Only. Inject 1 mL (1 prefilled syringe) subcutaneously into appropriate injection site per protocol every 12 weeks in the clinic. (Approved injection site(s): upper arm, upper thigh & abdomen). Please contact [facility] with any questions or concerns regarding this medication.     traMADol (ULTRAM) 50 MG tablet TAKE 1 TABLET EVERY 6 HOURS AS NEEDED FOR MODERATE PAIN 90 tablet 2   ciclopirox (PENLAC) 8 % solution Apply topically at bedtime. Apply over nail and surrounding skin. Apply daily over previous coat. After seven (7) days, may remove with alcohol and continue cycle. (Patient not taking: Reported on 11/02/2022) 6.6 mL 0   famotidine (PEPCID) 20 MG tablet Take 20 mg by mouth 2 (two) times daily. Taking 1 tab twice daily only as needed generally once monthly (Patient not taking: Reported on 11/02/2022)     nitroGLYCERIN (NITROSTAT) 0.4 MG SL tablet PLACE 1 TABLET UNDER THE TONGUE EVERY 5 MINUTES AS NEEDED FOR CHEST PAIN. MAX 3 DOSES, CALL 911. 26 tablet 6   Pramlintide Acetate 1500 MCG/1.5ML SOPN Inject 15 mcg into the skin 3 (three) times daily before meals. (Patient not taking: Reported on 11/02/2022)     predniSONE (DELTASONE) 5 MG tablet Take 1 tablet (5 mg total) by mouth daily with breakfast. (Patient not taking: Reported on 11/02/2022) 30 tablet 0   No current facility-administered medications for this visit.    Allergies:   Aspirin    Social History:  The patient  reports that she has been smoking cigarettes. She has a 22.50 pack-year smoking history. She has never used smokeless tobacco. She reports current alcohol use. She reports that she does not currently use drugs after having used the following drugs: "Crack" cocaine and Marijuana.   Family  History:  The patient's family history includes Breast cancer (age of onset: 47) in her mother; Cancer in her maternal uncle; Cancer (age of onset: 25) in her sister; Hypertension in her father and sister; Lung cancer (age of onset: 48) in her  sister; Stomach cancer (age of onset: 39) in her sister.    ROS:  Please see the history of present illness.   Otherwise, review of systems are positive for chronic visual problems .   All other systems are reviewed and negative .    PHYSICAL EXAM: VS:  BP (!) 98/56   Pulse 74   Ht 5' (1.524 m)   Wt 193 lb 6.4 oz (87.7 kg)   LMP 01/04/2015   SpO2 95%   BMI 37.77 kg/m  , BMI Body mass index is 37.77 kg/m. GEN: Well nourished, well developed, in no acute distress HEENT: normal Neck: no JVD, carotid bruits, or masses Cardiac: RRR; no murmurs, rubs, or gallops,no edema  Respiratory:  clear to auscultation bilaterally, normal work of breathing GI: soft, nontender, nondistended, + BS MS: no deformity or atrophy Skin: warm and dry, no rash Neuro:  Strength and sensation are intact Psych: euthymic mood, full affect   EKG:   The ekg ordered 8/23 demonstrates NSR, inferior TWI, no change in 2/23   Recent Labs: 02/23/2022: Magnesium 2.2 06/09/2022: ALT 8; BUN 20; Creatinine, Ser 1.26; Hemoglobin 13.8; Platelets 351; Potassium 3.6; Sodium 141   Lipid Panel    Component Value Date/Time   CHOL 202 (H) 06/09/2022 0945   TRIG 95 06/09/2022 0945   HDL 89 06/09/2022 0945   CHOLHDL 2.6 09/14/2021 1336   CHOLHDL 2.1 06/16/2018 0642   VLDL 14 06/16/2018 0642   LDLCALC 96 06/09/2022 0945     Other studies Reviewed: Additional studies/ records that were reviewed today with results demonstrating: Other labs total cholesterol 202 HDL 89 LDL 96 triglycerides 95.   ASSESSMENT AND PLAN:  CAD/Old MI: refill NTG.  No exertional angina. Continue clopidogrel monotherapy.  Type 2 DM: A1C 7.2 in January 2024.  Whole food, plant-based diet.  [place]-fiber diet.  Avoid processed foods.  She has had issues with weight gain of late, especially since cutting back on her cigarettes. Tobacco abuse: cut back. 5 cigs/day.  HTN: The current medical regimen is effective;  continue present plan and  medications. Hyperlipidemia: Continue rosuvastatin along with a healthy diet with plenty of fiber, Whole food, plant-based diet.  Avoid processed foods. Morbid obesity: Working on weight loss   Current medicines are reviewed at length with the patient today.  The patient concerns regarding her medicines were addressed.  The following changes have been made:  No change  Labs/ tests ordered today include:  No orders of the defined types were placed in this encounter.   Recommend 150 minutes/week of aerobic exercise Low fat, low carb, [place]ur[place] fiber diet recommended  Disposition:   FU in 1 year   Signed, Larae Grooms, MD  11/02/2022 11:54 AM    [facility] [address], [place], [place]  [address] Phone: [phone]; Fax: [phone]

## 2022-11-02 ENCOUNTER — Encounter: Payer: Self-pay | Admitting: Interventional Cardiology

## 2022-11-02 ENCOUNTER — Ambulatory Visit: Payer: Medicaid Other | Attending: Interventional Cardiology | Admitting: Interventional Cardiology

## 2022-11-02 VITALS — BP 98/56 | HR 74 | Ht 60.0 in | Wt 193.4 lb

## 2022-11-02 DIAGNOSIS — I10 Essential (primary) hypertension: Secondary | ICD-10-CM

## 2022-11-02 DIAGNOSIS — I252 Old myocardial infarction: Secondary | ICD-10-CM

## 2022-11-02 DIAGNOSIS — I2510 Atherosclerotic heart disease of native coronary artery without angina pectoris: Secondary | ICD-10-CM

## 2022-11-02 DIAGNOSIS — E785 Hyperlipidemia, unspecified: Secondary | ICD-10-CM

## 2022-11-02 DIAGNOSIS — E1159 Type 2 diabetes mellitus with other circulatory complications: Secondary | ICD-10-CM | POA: Diagnosis not present

## 2022-11-02 DIAGNOSIS — Z720 Tobacco use: Secondary | ICD-10-CM | POA: Diagnosis not present

## 2022-11-02 DIAGNOSIS — I25119 Atherosclerotic heart disease of native coronary artery with unspecified angina pectoris: Secondary | ICD-10-CM | POA: Diagnosis not present

## 2022-11-02 MED ORDER — NITROGLYCERIN 0.4 MG SL SUBL
SUBLINGUAL_TABLET | SUBLINGUAL | 6 refills | Status: DC
Start: 2022-11-02 — End: 2023-12-26

## 2022-11-02 NOTE — Patient Instructions (Signed)
Medication Instructions:  Your physician recommends that you continue on your current medications as directed. Please refer to the Current Medication list given to you today.  *If you need a refill on your cardiac medications before your next appointment, please call your pharmacy*   Lab Work: none If you have labs (blood work) drawn today and your tests are completely normal, you will receive your results only by: MyChart Message (if you have MyChart) OR A paper copy in the mail If you have any lab test that is abnormal or we need to change your treatment, we will call you to review the results.   Testing/Procedures: none   Follow-Up: At [facility], you and your health needs are our priority.  As part of our continuing mission to provide you with exceptional heart care, we have created designated Provider Care Teams.  These Care Teams include your primary Cardiologist (physician) and Advanced Practice Providers (APPs -  Physician Assistants and Nurse Practitioners) who all work together to provide you with the care you need, when you need it.  We recommend signing up for the patient portal called "MyChart".  Sign up information is provided on this After Visit Summary.  MyChart is used to connect with patients for Virtual Visits (Telemedicine).  Patients are able to view lab/test results, encounter notes, upcoming appointments, etc.  Non-urgent messages can be sent to your provider as well.   To learn more about what you can do with MyChart, go to [email or web address].    Your next appointment:   12 month(s)  Provider:   Jayadeep Varanasi, MD     Other Instructions    

## 2022-11-07 ENCOUNTER — Other Ambulatory Visit: Payer: Self-pay | Admitting: Podiatry

## 2022-11-14 ENCOUNTER — Other Ambulatory Visit: Payer: Self-pay | Admitting: Internal Medicine

## 2022-11-17 ENCOUNTER — Other Ambulatory Visit (INDEPENDENT_AMBULATORY_CARE_PROVIDER_SITE_OTHER): Payer: Medicaid Other

## 2022-11-17 DIAGNOSIS — E559 Vitamin D deficiency, unspecified: Secondary | ICD-10-CM

## 2022-11-17 DIAGNOSIS — E108 Type 1 diabetes mellitus with unspecified complications: Secondary | ICD-10-CM | POA: Diagnosis not present

## 2022-11-17 DIAGNOSIS — Z79899 Other long term (current) drug therapy: Secondary | ICD-10-CM

## 2022-11-17 DIAGNOSIS — E785 Hyperlipidemia, unspecified: Secondary | ICD-10-CM

## 2022-11-18 LAB — VITAMIN D 25 HYDROXY (VIT D DEFICIENCY, FRACTURES): Vit D, 25-Hydroxy: 25.8 ng/mL — ABNORMAL LOW (ref 30.0–100.0)

## 2022-11-18 LAB — CBC WITH DIFFERENTIAL/PLATELET
Basophils Absolute: 0 10*3/uL (ref 0.0–0.2)
Basos: 1 %
EOS (ABSOLUTE): 0.1 10*3/uL (ref 0.0–0.4)
Eos: 2 %
Hematocrit: 47.1 % — ABNORMAL HIGH (ref 34.0–46.6)
Hemoglobin: 14.9 g/dL (ref 11.1–15.9)
Immature Grans (Abs): 0 10*3/uL (ref 0.0–0.1)
Immature Granulocytes: 0 %
Lymphocytes Absolute: 2.1 10*3/uL (ref 0.7–3.1)
Lymphs: 30 %
MCH: 28.6 pg (ref 26.6–33.0)
MCHC: 31.6 g/dL (ref 31.5–35.7)
MCV: 90 fL (ref 79–97)
Monocytes Absolute: 0.4 10*3/uL (ref 0.1–0.9)
Monocytes: 6 %
Neutrophils Absolute: 4.3 10*3/uL (ref 1.4–7.0)
Neutrophils: 61 %
Platelets: 315 10*3/uL (ref 150–450)
RBC: 5.21 x10E6/uL (ref 3.77–5.28)
RDW: 15.9 % — ABNORMAL HIGH (ref 11.7–15.4)
WBC: 7 10*3/uL (ref 3.4–10.8)

## 2022-11-18 LAB — COMPREHENSIVE METABOLIC PANEL
ALT: 12 IU/L (ref 0–32)
AST: 22 IU/L (ref 0–40)
Albumin/Globulin Ratio: 1.4 (ref 1.2–2.2)
Albumin: 4.6 g/dL (ref 3.8–4.9)
Alkaline Phosphatase: 189 IU/L — ABNORMAL HIGH (ref 44–121)
BUN/Creatinine Ratio: 16 (ref 9–23)
BUN: 21 mg/dL (ref 6–24)
Bilirubin Total: 0.7 mg/dL (ref 0.0–1.2)
CO2: 20 mmol/L (ref 20–29)
Calcium: 9.6 mg/dL (ref 8.7–10.2)
Chloride: 105 mmol/L (ref 96–106)
Creatinine, Ser: 1.28 mg/dL — ABNORMAL HIGH (ref 0.57–1.00)
Globulin, Total: 3.4 g/dL (ref 1.5–4.5)
Glucose: 78 mg/dL (ref 70–99)
Potassium: 4 mmol/L (ref 3.5–5.2)
Sodium: 144 mmol/L (ref 134–144)
Total Protein: 8 g/dL (ref 6.0–8.5)
eGFR: 49 mL/min/{1.73_m2} — ABNORMAL LOW (ref >59)

## 2022-11-18 LAB — LIPID PANEL W/O CHOL/HDL RATIO
Cholesterol, Total: 181 mg/dL (ref 100–199)
HDL: 75 mg/dL (ref >39)
LDL Chol Calc (NIH): 91 mg/dL (ref 0–99)
Triglycerides: 85 mg/dL (ref 0–149)
VLDL Cholesterol Cal: 15 mg/dL (ref 5–40)

## 2022-11-18 LAB — HEMOGLOBIN A1C
Est. average glucose Bld gHb Est-mCnc: 146 mg/dL
Hgb A1c MFr Bld: 6.7 % — ABNORMAL HIGH (ref 4.8–5.6)

## 2022-11-23 ENCOUNTER — Ambulatory Visit (INDEPENDENT_AMBULATORY_CARE_PROVIDER_SITE_OTHER): Payer: Medicaid Other | Admitting: Internal Medicine

## 2022-11-23 ENCOUNTER — Encounter: Payer: Self-pay | Admitting: Internal Medicine

## 2022-11-23 VITALS — BP 138/70 | HR 76 | Resp 12 | Ht 60.0 in | Wt 198.0 lb

## 2022-11-23 DIAGNOSIS — E108 Type 1 diabetes mellitus with unspecified complications: Secondary | ICD-10-CM

## 2022-11-23 DIAGNOSIS — M059 Rheumatoid arthritis with rheumatoid factor, unspecified: Secondary | ICD-10-CM | POA: Diagnosis not present

## 2022-11-23 DIAGNOSIS — B372 Candidiasis of skin and nail: Secondary | ICD-10-CM

## 2022-11-23 DIAGNOSIS — Z720 Tobacco use: Secondary | ICD-10-CM

## 2022-11-23 DIAGNOSIS — E1042 Type 1 diabetes mellitus with diabetic polyneuropathy: Secondary | ICD-10-CM

## 2022-11-23 DIAGNOSIS — Z716 Tobacco abuse counseling: Secondary | ICD-10-CM

## 2022-11-23 DIAGNOSIS — E559 Vitamin D deficiency, unspecified: Secondary | ICD-10-CM

## 2022-11-23 DIAGNOSIS — E785 Hyperlipidemia, unspecified: Secondary | ICD-10-CM

## 2022-11-23 MED ORDER — NYSTATIN 100000 UNIT/GM EX POWD: 1.0000 | Freq: Two times a day (BID) | CUTANEOUS | 0 refills | Status: DC

## 2022-11-23 NOTE — Patient Instructions (Signed)
Stop eating salty foods Get your covid vaccine fro [phone]

## 2022-11-23 NOTE — Progress Notes (Addendum)
Subjective:    Patient ID: [name] [name], female   DOB: April 16, 1966, 57 y.o.   MRN: [id number]   HPI   Obesity:  She feels she has gained weight with initiation of Symlin due to increased appetite.  Weight has gradually increased 22 lbs from August.   Later, feels her appetite has increased with decreased smoking--still smoking 5 cigs daily. States her coverage for insulin has changed--moving to Humalog from Novolog.  She has not yet changed as has a supply of Novolog.  Continues on her sliding scale, but states she is not giving any Novolog before sugar is above 200.    Lantus decreased to 10 units twice daily with the Simlin. Has had low blood glucose around 4:30 p.m. twice in last 2 weeks.  Gives history, however, that she is dosing her ss Novolog due to BG of 160 at 4 or so, giving herself insulin and then not eating until 6 pm.  Was not giving herself insulin then at her meal.    A1C now down to 6.7% on 4/19.  2.  Peripheral neuropathy and sleep:  stopped gabapentin.  States she did not know could help with peripheral neuropathy.  States she was sleeping too long in morning--not hearing her alarm clock.    3.  Rash, more so under right axilla, but may have on left:  Started with a new deodorant a week earlier.  She stopped the deodorant last week.  She has since used baby powder instead.    4.  RA:  Ultimately started on Enbrel with Dr. Herma Carson and joint swelling and pain better.  Was on prednisone from last fall due to RA flare and that could have increased her weight as well.   5.  Feels she may be retaining fluids as well.  Legs feel heavy as does her abdomen.  Initially, denies increased salt intake, but then admits to eating pickled veggies and pigs feet in recent weeks.  6.  Hyperlipidemia:  LDL was 91, though total now down to 181.  She is in a study with Dr. Eldridge Dace with [facility], so will need to check with his office before making changes.   7.  Vitamin D deficiency:  level now up to  28, still a bit low.   Current Meds  Medication Sig   acetaminophen (TYLENOL) 325 MG tablet Take 2 tablets (650 mg total) by mouth every 6 (six) hours as needed for mild pain or fever.   albuterol (VENTOLIN HFA) 108 (90 Base) MCG/ACT inhaler TAKE 2 PUFFS BY MOUTH EVERY 6 HOURS AS NEEDED FOR WHEEZE OR SHORTNESS OF BREATH   ammonium lactate (AMLACTIN) 12 % cream Apply topically as needed for dry skin.   ciclopirox (PENLAC) 8 % solution APPLY TOPICALLY AT BEDTIME. APPLY OVER NAIL AND SURROUNDING SKIN. APPLY DAILY OVER PREVIOUS COAT. AFTER SEVEN (7) DAYS, MAY REMOVE WITH ALCOHOL AND CONTINUE CYCLE.   clopidogrel (PLAVIX) 75 MG tablet Take 1 tablet (75 mg total) by mouth daily.   Continuous Blood Gluc Receiver (DEXCOM G6 RECEIVER) DEVI AS DIRECTED 364 DAYS   Continuous Blood Gluc Sensor (DEXCOM G6 SENSOR) MISC SMARTSIG:Topical Once a Month   Continuous Blood Gluc Transmit (DEXCOM G6 TRANSMITTER) MISC AS DIRECTED 90 DAYS   [facility] D3 25 MCG (1000 UT) capsule TAKE 1 CAPSULE BY MOUTH EVERY DAY   cyclobenzaprine (FLEXERIL) 10 MG tablet TAKE 1-2 TABLETS (10-20 MG TOTAL) BY MOUTH AT BEDTIME AS NEEDED FOR MUSCLE SPASMS. MUSCLE SPASMS   diphenhydrAMINE (BENADRYL)  25 MG tablet Take by mouth.   ENBREL SURECLICK 50 MG/ML injection Inject into the skin.   folic acid (FOLVITE) 1 MG tablet Take 1 mg by mouth daily.   furosemide (LASIX) 40 MG tablet TAKE 1 TABLET BY MOUTH EVERY DAY IN THE MORNING   hydrOXYzine (ATARAX) 25 MG tablet Take 25 mg by mouth 2 (two) times daily.   Incontinence Supply Disposable (POISE ULTRA THINS) PADS Use 5 pads daily as needed for urinary stress incontinence   insulin aspart (NOVOLOG) 100 UNIT/ML injection Inject 3 Units into the skin 3 (three) times daily with meals. Per Sliding scale CBG 100-150: 4 units, 151-200: 6 units, 201-250: 8 units, 251-300: 10 units, 301-350: 12 units, 351-400: 14 units, > 401; call doctor   methotrexate (RHEUMATREX) 2.5 MG tablet Take 10 mg by mouth every  Wednesday.   metoprolol tartrate 37.5 MG TABS Take 25 mg by mouth 2 (two) times daily.   nitroGLYCERIN (NITROSTAT) 0.4 MG SL tablet PLACE 1 TABLET UNDER THE TONGUE EVERY 5 MINUTES AS NEEDED FOR CHEST PAIN. MAX 3 DOSES, CALL 911.   ondansetron (ZOFRAN) 4 MG tablet 1 TAB BY MOUTH EVERY 6 HOURS AS NEEDED FOR NAUSEA AND VOMITING.   pantoprazole (PROTONIX) 40 MG tablet Take 40 mg by mouth 2 (two) times daily.   Pramlintide Acetate 1500 MCG/1.5ML SOPN Inject 15 mcg into the skin 3 (three) times daily before meals.   prednisoLONE acetate (PRED FORTE) 1 % ophthalmic suspension Place 4-5 drops into the left eye daily.   rosuvastatin (CRESTOR) 40 MG tablet TAKE 1 TABLET BY MOUTH EVERY DAY   sertraline (ZOLOFT) 100 MG tablet 1 1/2 TABS BY MOUTH DAILY (Patient taking differently: 1 tab by mouth daily)   Study - [name](A) - olpasiran (AMG 890) 142 mg/mL or placebo SQ injection (PI-Hilty) Inject 142 mg into the skin once. For Investigational Use Only. Inject 1 mL (1 prefilled syringe) subcutaneously into appropriate injection site per protocol every 12 weeks in the clinic. (Approved injection site(s): upper arm, upper thigh & abdomen). Please contact [facility] with any questions or concerns regarding this medication.   traMADol (ULTRAM) 50 MG tablet TAKE 1 TABLET EVERY 6 HOURS AS NEEDED FOR MODERATE PAIN     Allergies  Allergen Reactions   Aspirin Other (See Comments)    Stomach bleeds  Other Reaction(s): Other (See Comments)  Stomach bleeds, GI bleeding     Review of Systems    Objective:   BP 138/70 (BP Location: Right Arm, Patient Position: Sitting, Cuff Size: Normal)   Pulse 76   Resp 12   Ht 5' (1.524 m)   Wt 198 lb (89.8 kg)   LMP 01/04/2015   BMI 38.67 kg/m   Physical Exam HEENT:  Right eye with pupil reactive to light.  Left cornea scarred white.   Neck:  Supple, No adenopathy Chest:  CTA Axilla with wet erythema in folds CV:  RRR without murmur or rub.  No carotid  bruits.  Carotid, radial and DP pulses normal and equal Abd:  S, NT, No HSM or mass. + BS LE:  mild to moderate pitting edema, pretibial area bilaterally.   Assessment & Plan   IDDM and Obesity:  Doubt Symlin as cause of weight gain as expect weight loss..  Demaris gives multiple other reasons for weight gain after thinking about what she has been doing.  Encouraged her to find ways to be physically active and improve her diet as well.  Also, encouraged her not  to dose her short acting insulin if she is not planning to eat within 10 minutes to avoid low blood glucose.  Holding on setting her up with home care as want her to remain physically active.  2.  Tobacco Use Disorder:  encouraged chewing nicotine gum or other gum instead of smoking or snacking.    3.  Peripheral edema:  stop eating salty foods.  4.  Intertriginous yeast infection/axilla:  stop using cornstarch baby powder.  Nystatin powder to affected areas twice daily.  5.  Elevated LDL:  need to discuss with cardiology/Dr. Eldridge Dace as in study.    6.  Rheumatoid Arthritis:  better control with addition of Enbrel.  As per Rheum.  7.  Peripheral Neuropathy and sleep:  encouraged her to restart Gabapentin at 100 mg at bedtime.  8.  Low vitamin D:  continue supplementation and recheck at CPE

## 2022-12-06 ENCOUNTER — Telehealth: Payer: Self-pay

## 2022-12-06 NOTE — Telephone Encounter (Signed)
Patient call to ask if Dr. Delrae Alfred has sent her refills for her anxiety medication, Hydroxyzine. Patient said she needs refills.

## 2022-12-12 ENCOUNTER — Other Ambulatory Visit: Payer: Self-pay

## 2022-12-12 MED ORDER — HYDROXYZINE HCL 25 MG PO TABS: 25.0000 mg | ORAL_TABLET | Freq: Two times a day (BID) | ORAL | 10 refills | Status: DC

## 2022-12-12 NOTE — Telephone Encounter (Signed)
Patient called to report that she is no longer going to the specialist that was prescribing Hydroxyzine.

## 2022-12-12 NOTE — Telephone Encounter (Signed)
Rx has been sent after Dr Delrae Alfred approval. Patient has been notified of Rx.

## 2022-12-22 ENCOUNTER — Other Ambulatory Visit: Payer: Self-pay | Admitting: Internal Medicine

## 2022-12-22 DIAGNOSIS — Z1231 Encounter for screening mammogram for malignant neoplasm of breast: Secondary | ICD-10-CM

## 2022-12-27 MED ORDER — STUDY - OCEAN(A) - OLPASIRAN (AMG 890) 142 MG/ML OR PLACEBO SQ INJECTION (PI-HILTY)
142.0000 mg | PREFILLED_SYRINGE | Freq: Once | SUBCUTANEOUS | Status: AC
  Administered 2023-01-01: 142 mg via SUBCUTANEOUS
  Filled 2022-12-27: qty 1

## 2023-01-01 VITALS — BP 135/57 | HR 71 | Temp 97.9°F | Wt 202.4 lb

## 2023-01-01 DIAGNOSIS — Z006 Encounter for examination for normal comparison and control in clinical research program: Secondary | ICD-10-CM

## 2023-01-01 NOTE — Research (Signed)
[name](a) week 60  Patient seen in clinic today for week  60 visit of [name](a) trial. Patient denies any adverse events since last visit. Reviewed medications with patient and no changes noted at this visit. All procedures completed per protocol and patient tolerated well without complaints. IP injection was given at 0954 in the RUQ  without any complications noted.  Next visit Week 72 scheduled for 03/26/2023.  Reviewed lipid blinding with the patient and he/she understands.   [name](a) IP ADMINISTRATION Subject [id number] IP Box [id number] IP lot#:[id number] Administration site:RUQ   Current Outpatient Medications:    acetaminophen (TYLENOL) 325 MG tablet, Take 2 tablets (650 mg total) by mouth every 6 (six) hours as needed for mild pain or fever., Disp: , Rfl:    albuterol (VENTOLIN HFA) 108 (90 Base) MCG/ACT inhaler, TAKE 2 PUFFS BY MOUTH EVERY 6 HOURS AS NEEDED FOR WHEEZE OR SHORTNESS OF BREATH, Disp: 18 each, Rfl: 1   ammonium lactate (AMLACTIN) 12 % cream, Apply topically as needed for dry skin., Disp: 385 g, Rfl: 2   ciclopirox (PENLAC) 8 % solution, APPLY TOPICALLY AT BEDTIME. APPLY OVER NAIL AND SURROUNDING SKIN. APPLY DAILY OVER PREVIOUS COAT. AFTER SEVEN (7) DAYS, MAY REMOVE WITH ALCOHOL AND CONTINUE CYCLE., Disp: 6.6 mL, Rfl: 0   clopidogrel (PLAVIX) 75 MG tablet, Take 1 tablet (75 mg total) by mouth daily., Disp: 90 tablet, Rfl: 2   CVS D3 25 MCG (1000 UT) capsule, TAKE 1 CAPSULE BY MOUTH EVERY DAY, Disp: 90 capsule, Rfl: 0   cyclobenzaprine (FLEXERIL) 10 MG tablet, TAKE 1-2 TABLETS (10-20 MG TOTAL) BY MOUTH AT BEDTIME AS NEEDED FOR MUSCLE SPASMS. MUSCLE SPASMS, Disp: 30 tablet, Rfl: 0   diphenhydrAMINE (BENADRYL) 25 MG tablet, Take by mouth., Disp: , Rfl:    ENBREL SURECLICK 50 MG/ML injection, Inject into the skin., Disp: , Rfl:    famotidine (PEPCID) 20 MG tablet, Take 20 mg by mouth 2 (two) times daily. Taking 1 tab twice daily only as needed generally once  monthly, Disp: , Rfl:    folic acid (FOLVITE) 1 MG tablet, Take 1 mg by mouth daily., Disp: , Rfl:    furosemide (LASIX) 40 MG tablet, TAKE 1 TABLET BY MOUTH EVERY DAY IN THE MORNING, Disp: 90 tablet, Rfl: 3   gabapentin (NEURONTIN) 100 MG capsule, Take 1 capsule by mouth at bedtime., Disp: , Rfl:    HUMALOG KWIKPEN 100 UNIT/ML KwikPen, Inject 100 Units into the skin.  3 UNITS AT BREAKFAST, 3 units at LUNCH, 3 UNITS AT EVENING MEAL, PLUS 2 EXTRA UNITS IF OVER 300 MG/DL Subcutaneous (max daily dose of 24 units) Subcutaneous for 90 days, Disp: , Rfl:    hydrOXYzine (ATARAX) 25 MG tablet, Take 1 tablet (25 mg total) by mouth 2 (two) times daily., Disp: 60 tablet, Rfl: 10   Incontinence Supply Disposable (POISE ULTRA THINS) PADS, Use 5 pads daily as needed for urinary stress incontinence, Disp: 150 each, Rfl: 11   insulin aspart (NOVOLOG) 100 UNIT/ML injection, Inject 3 Units into the skin 3 (three) times daily with meals. Per Sliding scale CBG 100-150: 4 units, 151-200: 6 units, 201-250: 8 units, 251-300: 10 units, 301-350: 12 units, 351-400: 14 units, > 401; call doctor, Disp: , Rfl:    LANTUS SOLOSTAR 100 UNIT/ML Solostar Pen, Inject 12 Units into the skin 2 (two) times daily., Disp: , Rfl:    methotrexate (RHEUMATREX) 2.5 MG tablet, Take 10 mg by mouth every Wednesday., Disp: , Rfl:  metoprolol tartrate 37.5 MG TABS, Take 25 mg by mouth 2 (two) times daily., Disp: 30 tablet, Rfl: 11   nitroGLYCERIN (NITROSTAT) 0.4 MG SL tablet, PLACE 1 TABLET UNDER THE TONGUE EVERY 5 MINUTES AS NEEDED FOR CHEST PAIN. MAX 3 DOSES, CALL 911., Disp: 26 tablet, Rfl: 6   nystatin (MYCOSTATIN/NYSTOP) powder, Apply 1 Application topically 2 (two) times daily., Disp: 30 g, Rfl: 0   ondansetron (ZOFRAN) 4 MG tablet, 1 TAB BY MOUTH EVERY 6 HOURS AS NEEDED FOR NAUSEA AND VOMITING., Disp: 12 tablet, Rfl: 0   pantoprazole (PROTONIX) 40 MG tablet, Take 40 mg by mouth 2 (two) times daily., Disp: , Rfl:    Pramlintide Acetate 1500  MCG/1.5ML SOPN, Inject 15 mcg into the skin 3 (three) times daily before meals., Disp: , Rfl:    prednisoLONE acetate (PRED FORTE) 1 % ophthalmic suspension, Place 4-5 drops into the left eye daily., Disp: , Rfl: 6   rosuvastatin (CRESTOR) 40 MG tablet, TAKE 1 TABLET BY MOUTH EVERY DAY, Disp: 90 tablet, Rfl: 3   sertraline (ZOLOFT) 100 MG tablet, 1 1/2 TABS BY MOUTH DAILY (Patient taking differently: 1 tab by mouth daily), Disp: 135 tablet, Rfl: 3   Study - [name](A) - olpasiran (AMG 890) 142 mg/mL or placebo SQ injection (PI-Hilty), Inject 142 mg into the skin once. For Investigational Use Only. Inject 1 mL (1 prefilled syringe) subcutaneously into appropriate injection site per protocol every 12 weeks in the clinic. (Approved injection site(s): upper arm, upper thigh & abdomen). Please contact [facility] with any questions or concerns regarding this medication., Disp: , Rfl:    traMADol (ULTRAM) 50 MG tablet, TAKE 1 TABLET EVERY 6 HOURS AS NEEDED FOR MODERATE PAIN, Disp: 90 tablet, Rfl: 2   Continuous Blood Gluc Receiver (DEXCOM G6 RECEIVER) DEVI, AS DIRECTED 364 DAYS, Disp: , Rfl:    Continuous Blood Gluc Sensor (DEXCOM G6 SENSOR) MISC, SMARTSIG:Topical Once a Month, Disp: , Rfl:    Continuous Blood Gluc Transmit (DEXCOM G6 TRANSMITTER) MISC, AS DIRECTED 90 DAYS, Disp: , Rfl:   Current Facility-Administered Medications:    Study - [name](A) - olpasiran (AMG 890) 142 mg/mL or placebo SQ injection (PI-Hilty), 142 mg, Subcutaneous, Once, Hilty, Lisette Abu, MD

## 2023-01-05 ENCOUNTER — Other Ambulatory Visit: Payer: Self-pay | Admitting: Internal Medicine

## 2023-01-07 ENCOUNTER — Encounter: Payer: Self-pay | Admitting: Internal Medicine

## 2023-01-07 DIAGNOSIS — B372 Candidiasis of skin and nail: Secondary | ICD-10-CM | POA: Insufficient documentation

## 2023-01-08 ENCOUNTER — Other Ambulatory Visit: Payer: Self-pay | Admitting: Internal Medicine

## 2023-01-19 ENCOUNTER — Telehealth: Payer: Self-pay

## 2023-01-19 ENCOUNTER — Other Ambulatory Visit: Payer: Self-pay | Admitting: Internal Medicine

## 2023-01-19 NOTE — Telephone Encounter (Signed)
Patient need a refill of nystatin topical.

## 2023-01-30 ENCOUNTER — Telehealth: Payer: Self-pay

## 2023-01-30 ENCOUNTER — Ambulatory Visit: Payer: Medicaid Other | Admitting: Podiatry

## 2023-01-30 NOTE — Telephone Encounter (Signed)
Patient would like an appointment sooner than her August appointment.

## 2023-01-31 ENCOUNTER — Ambulatory Visit
Admission: RE | Admit: 2023-01-31 | Discharge: 2023-01-31 | Disposition: A | Discharge status: Home / Self Care | Payer: Medicaid Other | Source: Ambulatory Visit | Attending: Internal Medicine | Admitting: Internal Medicine

## 2023-01-31 DIAGNOSIS — Z1231 Encounter for screening mammogram for malignant neoplasm of breast: Secondary | ICD-10-CM

## 2023-02-15 ENCOUNTER — Ambulatory Visit: Payer: Medicaid Other | Admitting: Internal Medicine

## 2023-02-15 ENCOUNTER — Encounter: Payer: Self-pay | Admitting: Internal Medicine

## 2023-02-15 VITALS — BP 136/80 | HR 76 | Resp 20 | Ht 60.0 in | Wt 201.0 lb

## 2023-02-15 DIAGNOSIS — E108 Type 1 diabetes mellitus with unspecified complications: Secondary | ICD-10-CM

## 2023-02-15 DIAGNOSIS — B372 Candidiasis of skin and nail: Secondary | ICD-10-CM

## 2023-02-15 DIAGNOSIS — R4020 Unspecified coma: Secondary | ICD-10-CM
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
## 2023-02-15 NOTE — Telephone Encounter (Signed)
Patient has been scheduled

## 2023-02-15 NOTE — Patient Instructions (Signed)
Wake every morning at 8:30 a.m. Check sugar Take Pantoprazole Give Lantus Prepare breakfast 10 minutes or less before breakfast, may check sugar again if feel need Give Humalog 3 units (or 5 units if sugar was above 300 when checked before lantus given or 10 minutes before eating)  Noon:  check sugar and give 3 units Humalog (5 units if sugar above 300)  Eat within 10 minutes of giving Humalog  5:30 pm.:  check sugar and give 3 units Humalog (5 units if sugar above 300)  Eat within 10 minutes of giving Humalog  ALWAYS HAVE THE MINTS AND PEANUT BUTTER CRACKERS WITH YOU AT ALL TIMES  IF YOU GET UPSET, GET YOURSELF AWAY FROM THE SITUATION, PARK YOUR CAR, CHECK YOUR SUGAR AND EAT SOMETHING IF SUGAR DROPPING.   USE YOUR CALMING TECHNIQUES TO  KEEP YOUR SUGAR STABLE.

## 2023-02-19 ENCOUNTER — Telehealth: Payer: Self-pay | Admitting: Interventional Cardiology

## 2023-02-19 DIAGNOSIS — Z0279 Encounter for issue of other medical certificate: Secondary | ICD-10-CM

## 2023-02-19 NOTE — Telephone Encounter (Signed)
D.O.T. disability form and payment received.  Form in Dr. Hoyle Barr box.

## 2023-02-27 ENCOUNTER — Ambulatory Visit (INDEPENDENT_AMBULATORY_CARE_PROVIDER_SITE_OTHER): Payer: Medicaid Other | Admitting: Podiatry

## 2023-02-27 DIAGNOSIS — E1149 Type 2 diabetes mellitus with other diabetic neurological complication: Secondary | ICD-10-CM

## 2023-02-27 DIAGNOSIS — B351 Tinea unguium: Secondary | ICD-10-CM

## 2023-02-27 DIAGNOSIS — M79675 Pain in left toe(s): Secondary | ICD-10-CM | POA: Diagnosis not present

## 2023-02-27 DIAGNOSIS — L84 Corns and callosities: Secondary | ICD-10-CM

## 2023-02-27 DIAGNOSIS — M79674 Pain in right toe(s): Secondary | ICD-10-CM | POA: Diagnosis not present

## 2023-02-27 NOTE — Progress Notes (Signed)
Subjective: Chief Complaint  Patient presents with   Nail Problem    DFC     57 year old female presents the above concerns.  No open lesions.  Toenails are thickened elongated she has difficulty trimming himself and causing pain.  She has no other concerns today.  PCP: Julieanne Manson, MD-last seen May 24, 2022 Endocrinologist: Dr. Talmage Coin, MD- last seen 02/19/2023  Last A1c 6.7 on 11/17/2022  Objective: AAO 3, NAD DP/PT pulses palpable, CRT less than 3 seconds Sensation decreased with Semmes Weinstein monofilament. Nails hypertrophic, dystrophic, elongated, brittle, discolored 10. There is tenderness overlying the nails 1-5 bilaterally. There is no surrounding erythema or drainage along the nail sites. Calluses much improved since using the AmLactin. No open lesions Hammertoes present No pain with calf compression, swelling, warmth, erythema.  Symptomatic onychomycosis, hyperkeratotic lesions  Plan: -Treatment options including alternatives, risks, complications were discussed -Nails sharply debrided 10 without complication/bleeding. -Hyperkeratotic lesion sharply debrided x1 without any complications or bleeding.   -Continue AmLactin for dry skin as needed -Discussed daily foot inspection. If there are any changes, to call the office immediately.  -Follow-up in 3 months or sooner if any problems are to arise. In the meantime, encouraged to call the office with any questions, concerns, changes symptoms.  Ovid Curd, DPM

## 2023-02-28 NOTE — Telephone Encounter (Signed)
Completed paperwork placed in basket at front desk

## 2023-02-28 NOTE — Telephone Encounter (Signed)
Completed form faxed to [employer] and scanned into chart documents. Billing notified and original mailed to patient.

## 2023-03-01 ENCOUNTER — Ambulatory Visit: Payer: Medicaid Other | Admitting: Internal Medicine

## 2023-03-06 ENCOUNTER — Other Ambulatory Visit: Payer: Self-pay | Admitting: Internal Medicine

## 2023-03-06 ENCOUNTER — Other Ambulatory Visit: Payer: Self-pay | Admitting: Interventional Cardiology

## 2023-03-06 ENCOUNTER — Other Ambulatory Visit: Payer: Self-pay | Admitting: Podiatry

## 2023-03-17 ENCOUNTER — Other Ambulatory Visit: Payer: Self-pay | Admitting: Podiatry

## 2023-03-26 ENCOUNTER — Encounter: Payer: Medicaid Other | Admitting: *Deleted

## 2023-03-26 DIAGNOSIS — Z006 Encounter for examination for normal comparison and control in clinical research program: Secondary | ICD-10-CM

## 2023-03-26 MED ORDER — STUDY - OCEAN(A) - OLPASIRAN (AMG 890) 142 MG/ML OR PLACEBO SQ INJECTION (PI-HILTY)
142.0000 mg | PREFILLED_SYRINGE | Freq: Once | SUBCUTANEOUS | Status: AC
  Administered 2023-03-26: 142 mg via SUBCUTANEOUS
  Filled 2023-03-26: qty 1

## 2023-03-26 NOTE — Research (Signed)
[name] [name]  Week 72   Patient doing well no chest pains or shortness of breath.  Vitals: [x]  Experience any AE/SAE/Hospitalizations [x]   If yes please explain:  Labs collected: 0956  Non-Fatal Potential Endpoint Assessment Yes  No   Has the subject experienced/undergone any of the following since the last visit/contact?   []   [x]    Any Coronary Artery Revascularization/Cerebrovascular Revascularization/ Peripheral Artery Revascularization/Amputation Procedure   []   [x]    Myocardial Infarction []   [x]    Stroke   []   [x]    Provide the date for the non-fatal Potential Endpoints status:   []   [x]    IP admin please see MAR (please add Box # to comment section on MAR) [x]    Current Outpatient Medications:    acetaminophen (TYLENOL) 325 MG tablet, Take 2 tablets (650 mg total) by mouth every 6 (six) hours as needed for mild pain or fever., Disp: , Rfl:    albuterol (VENTOLIN HFA) 108 (90 Base) MCG/ACT inhaler, TAKE 2 PUFFS BY MOUTH EVERY 6 HOURS AS NEEDED FOR WHEEZE OR SHORTNESS OF BREATH, Disp: 18 each, Rfl: 1   ammonium lactate (AMLACTIN) 12 % cream, Apply topically as needed for dry skin., Disp: 385 g, Rfl: 2   clopidogrel (PLAVIX) 75 MG tablet, TAKE 1 TABLET BY MOUTH EVERY DAY, Disp: 90 tablet, Rfl: 2   Continuous Blood Gluc Receiver (DEXCOM G6 RECEIVER) DEVI, AS DIRECTED 364 DAYS, Disp: , Rfl:    Continuous Blood Gluc Sensor (DEXCOM G6 SENSOR) MISC, SMARTSIG:Topical Once [name] Month, Disp: , Rfl:    Continuous Blood Gluc Transmit (DEXCOM G6 TRANSMITTER) MISC, AS DIRECTED 90 DAYS, Disp: , Rfl:    CVS D3 25 MCG (1000 UT) capsule, TAKE 1 CAPSULE BY MOUTH EVERY DAY, Disp: 90 capsule, Rfl: 0   cyclobenzaprine (FLEXERIL) 10 MG tablet, TAKE 1-2 TABLETS (10-20 MG TOTAL) BY MOUTH AT BEDTIME AS NEEDED FOR MUSCLE SPASMS. MUSCLE SPASMS, Disp: 30 tablet, Rfl: 0   diphenhydrAMINE (BENADRYL) 25 MG tablet, Take by mouth., Disp: , Rfl:    ENBREL SURECLICK 50 MG/ML injection, Inject into the skin., Disp: ,  Rfl:    folic acid (FOLVITE) 1 MG tablet, Take 1 mg by mouth daily., Disp: , Rfl:    furosemide (LASIX) 40 MG tablet, TAKE 1 TABLET BY MOUTH EVERY DAY IN THE MORNING, Disp: 90 tablet, Rfl: 3   HUMALOG KWIKPEN 100 UNIT/ML KwikPen, Inject 100 Units into the skin.  3 UNITS AT BREAKFAST, 3 units at LUNCH, 3 UNITS AT EVENING MEAL, PLUS 2 EXTRA UNITS IF OVER 300 MG/DL Subcutaneous (max daily dose of 24 units) Subcutaneous for 90 days, Disp: , Rfl:    hydrOXYzine (ATARAX) 25 MG tablet, TAKE 1 TABLET BY MOUTH TWICE [name] DAY, Disp: 180 tablet, Rfl: 3   Incontinence Supply Disposable (POISE ULTRA THINS) PADS, Use 5 pads daily as needed for urinary stress incontinence, Disp: 150 each, Rfl: 11   LANTUS SOLOSTAR 100 UNIT/ML Solostar Pen, Inject 12 Units into the skin 2 (two) times daily., Disp: , Rfl:    metoprolol tartrate 37.5 MG TABS, Take 25 mg by mouth 2 (two) times daily., Disp: 30 tablet, Rfl: 11   nitroGLYCERIN (NITROSTAT) 0.4 MG SL tablet, PLACE 1 TABLET UNDER THE TONGUE EVERY 5 MINUTES AS NEEDED FOR CHEST PAIN. MAX 3 DOSES, CALL 911., Disp: 26 tablet, Rfl: 6   nystatin (MYCOSTATIN/NYSTOP) powder, APPLY TO AFFECTED AREA TWICE [name] DAY, Disp: 30 g, Rfl: 0   ondansetron (ZOFRAN) 4 MG tablet, 1 TAB BY  MOUTH EVERY 6 HOURS AS NEEDED FOR NAUSEA AND VOMITING., Disp: 12 tablet, Rfl: 0   pantoprazole (PROTONIX) 40 MG tablet, Take 40 mg by mouth 2 (two) times daily., Disp: , Rfl:    Pramlintide Acetate 1500 MCG/1.5ML SOPN, Inject 15 mcg into the skin 3 (three) times daily before meals., Disp: , Rfl:    prednisoLONE acetate (PRED FORTE) 1 % ophthalmic suspension, Place 4-5 drops into the left eye daily., Disp: , Rfl: 6   rosuvastatin (CRESTOR) 40 MG tablet, TAKE 1 TABLET BY MOUTH EVERY DAY, Disp: 90 tablet, Rfl: 3   sertraline (ZOLOFT) 100 MG tablet, 1 1/2 TABS BY MOUTH DAILY (Patient taking differently: 1 tab by mouth daily), Disp: 135 tablet, Rfl: 3   Study - [name]([name]) - olpasiran (AMG 890) 142 mg/mL or placebo SQ  injection (PI-Hilty), Inject 142 mg into the skin once. For Investigational Use Only. Inject 1 mL (1 prefilled syringe) subcutaneously into appropriate injection site per protocol every 12 weeks in the clinic. (Approved injection site(s): upper arm, upper thigh & abdomen). Please contact [facility] with any questions or concerns regarding this medication., Disp: , Rfl:    traMADol (ULTRAM) 50 MG tablet, TAKE 1 TABLET EVERY 6 HOURS AS NEEDED FOR MODERATE PAIN, Disp: 90 tablet, Rfl: 2   ciclopirox (PENLAC) 8 % solution, APPLY TOPICALLY AT BEDTIME. APPLY OVER NAIL AND SURROUNDING SKIN. APPLY DAILY OVER PREVIOUS COAT. AFTER SEVEN (7) DAYS, MAY REMOVE WITH ALCOHOL AND CONTINUE CYCLE. (Patient not taking: Reported on 03/26/2023), Disp: 6.6 mL, Rfl: 0   famotidine (PEPCID) 20 MG tablet, Take 20 mg by mouth 2 (two) times daily. Taking 1 tab twice daily only as needed generally once monthly (Patient not taking: Reported on 03/26/2023), Disp: , Rfl:    gabapentin (NEURONTIN) 100 MG capsule, Take 1 capsule by mouth at bedtime. (Patient not taking: Reported on 02/15/2023), Disp: , Rfl:    insulin aspart (NOVOLOG) 100 UNIT/ML injection, Inject 3 Units into the skin 3 (three) times daily with meals. Per Sliding scale CBG 100-150: 4 units, 151-200: 6 units, 201-250: 8 units, 251-300: 10 units, 301-350: 12 units, 351-400: 14 units, > 401; call doctor (Patient not taking: Reported on 02/15/2023), Disp: , Rfl:   Current Facility-Administered Medications:    Study - [name]([name]) - olpasiran (AMG 890) 142 mg/mL or placebo SQ injection (PI-Hilty), 142 mg, Subcutaneous, Once,

## 2023-03-28 NOTE — Research (Addendum)
Are there any labs that are clinically significant?  Yes []  OR No[x]   Is the patient eligible to continue enrollment in the study after screening visit?  Yes [x]   OR No[]   Please FORWARD back to me with any changes or follow up!   Any abnormal labs will be bolded and in red  ACCESSION NO. [id number]                                             Page 1 of 1                                                        INVESTIGATOR: ([id number])                          PROTOCOL   [id number]                     Zoila Shutter M.D.                             INVESTIGATOR NO.: [id number]                     c/o Claretta Fraise                              SUBJECT ID: [id number]                     [facility]                 SUBJECT INITIALS NOT COLLECTED:                     [address] [place] 2G401                 VISIT: [place], [place] [id number]                   SPONSOR REPORT TO:                 COLLECTION TIME:09:56 DATE:26-Mar-2023                     Mertha Baars                      DATE RECEIVED IN LABORATORY: 27-Mar-2023                     c/o Sponsor(or Addtl) [facility].Study DATE REPORTED BY LABORATORY: 27-Mar-2023                     [facility]                          SEX: F  BIRTHDATE:  29-Jan-1966    AGE: 41H  [address]                  Wallis Bamberg NO.: [address], [place] [address]                                                                                        Ref. Ranges               Clinical    Comments                                                                          Significance                                                                            Yes*  No                    ANION GAP                      Anion Gap      19      H    7-18 mEq/L         CHEMISTRY PANEL                      Total Bili     0.7          0.2-1.2 mg/dL                                 Dir Bili       0.2          0.0-0.4 mg/dL                                Ind Bili       0.5          0.0-1.2 mg/dL                                Alk Phos       166     H    35-104 U/L  ALT (SGPT)     8            4-43 U/L                                    AST (SGOT)     22           8-40 U/L                                    Urea Nitr      22           4-24 mg/dL                                   Creatinine     1.11         0.35-1.14 mg/dL                              Calcium        9.5          8.3-10.6 mg/dL                              Total Prot     8.1          6.1-8.4 g/dL                                 Alb BCG        4.1          3.3-4.9 g/dL                                 CK             37           26-192 U/L                                   Sodium         139          132-147 mEq/L                                Potassium      4.2          3.5-5.2 mEq/L                                Bicarb         21.6         19.3-29.3 mEq/L                              Chloride       103          94-112 mEq/L  ADJUSTED CALCIUM                      Adj Calc       9.5          8.3-10.6 mg/dL            HEMATOLOGY&DIFFERENTIAL PANEL                      HGB            15.4         11.6-16.4 g/dL                              HCT            48           34-48 %                                      RBC            5.2          4.1-5.6 x106/uL                              MCH            30           26-34 pg                                    MCHC           32           31-38 g/dL                                   RDW            15.5    H    12.0-15.0 %                                 RBC Morph      No Review Required                                       MCV            92           79-98 fL                                    WBC            8.16         3.80-10.70 x103/uL                           Neutrophil     4.94          1.96-7.23 x103/uL  Lymphocyte     2.50         0.91-4.28 x103/uL                           Monocytes      0.45         0.12-0.92 x103/uL                           Eosinophil     0.20         0.00-0.57 x103/uL                           Basophils      0.09         0.00-0.20 x103/uL                           Neutrophil     60.5         40.5-75.0 %                                 Lymphocyte     30.5         15.4-48.5%                                   Monocytes      5.5          2.6-10.1 %                                   Eosinophil     2.4          0.0-6.8 %                                    Basophils      1.1          0.0-2.0 %                                    Platelets      223          140-400 x103/uL                            ANC                      ANC            4.94         1.96-7.23 x103/uL                 EGFR                      CKDEPI eGF     58           mL/min/1.62m2            COAGULATION GROUP  APTT        22.9         21.9-29.4 sec                                PT             9.7          9.7-12.3 sec                                 INR           0.9          Patient not taking                                                       oral anticoagulant:                                                      0.8 - 1.2                                                                Patient taking                                                          oral anticoagulant:                                                      2.0 - 3.0                                  ALT & INR                      ALT & INR      To follow                                              AST & INR                      AST & INR      To follow                               ALT >  3 X ULN                      ALT>3XULN      Criteria not met                                        ALT > 5 X ULN                      ALT>5XULN      Criteria not met                                         ALT > 8 X ULN                      ALT>8XULN      Criteria not met                                        ALT & TBIL                      ALT & TBIL     Criteria not met                                        AST > 3 X ULN                      AST>3XULN      Criteria not met                                        AST > 5 X ULN                      AST>5XULN      Criteria not met                                        AST > 8 X ULN                      AST>8XULN      Criteria not met                                        AST & TBIL                      AST & TBIL     Criteria not met  No Ref Rng       ALT & INR                      ALT & INR      Criteria not met                                           AST & INR                      AST & INR      Criteria not met                            [id number] ANTIBODY COLL D/T                      CDate PreD     26-Mar-2023                                               CTime PreD     09:56        Chrystie Nose, MD, [facility], FACP  [place]  [facility]  Medical Director of the [facility]  [facility] Diplomate of the American Board of Clinical Lipidology Attending Cardiologist  Direct Dial: [phone]  Fax: [phone]  Website:  [email or web address]

## 2023-03-29 ENCOUNTER — Other Ambulatory Visit: Payer: Self-pay | Admitting: Internal Medicine

## 2023-04-04 ENCOUNTER — Other Ambulatory Visit: Payer: Self-pay | Admitting: Internal Medicine

## 2023-04-04 DIAGNOSIS — I10 Essential (primary) hypertension: Secondary | ICD-10-CM

## 2023-04-11 ENCOUNTER — Other Ambulatory Visit: Payer: Self-pay | Admitting: Internal Medicine

## 2023-04-16 ENCOUNTER — Other Ambulatory Visit: Payer: Self-pay | Admitting: Internal Medicine

## 2023-04-23 NOTE — Telephone Encounter (Signed)
Can you call and check with her as to what she is doing with her Metoprolol:  I think the last refill was incorrect with how to take.  Looks like I increased the dose to 37.5 mg twice daily, but instructions still stayed at 1 tab twice daily of 25 mg tabs.  Should be 1 1/2 tabs twice daily.

## 2023-04-27 ENCOUNTER — Other Ambulatory Visit: Payer: Self-pay

## 2023-04-27 NOTE — Telephone Encounter (Signed)
Patient reported that she is taking one 25mg  tab twice daily. Patient has been instructed to take 1 1/2 tab twice daily.

## 2023-04-27 NOTE — Telephone Encounter (Signed)
I went ahead and changed the prescription--just to make sure the entire prescription is correct before sent--a prescription was sent in for 37.5 mg tabs back in November, but the prescription was for 25 mg tabs and directions were to take 1 tab twice daily.

## 2023-05-03 ENCOUNTER — Emergency Department (HOSPITAL_COMMUNITY)
Admission: EM | Admit: 2023-05-03 | Discharge: 2023-05-03 | Disposition: A | Discharge status: Home / Self Care | Payer: Medicaid Other | Attending: Emergency Medicine | Admitting: Emergency Medicine

## 2023-05-03 ENCOUNTER — Emergency Department (HOSPITAL_COMMUNITY): Payer: Medicaid Other

## 2023-05-03 DIAGNOSIS — Z7902 Long term (current) use of antithrombotics/antiplatelets: Secondary | ICD-10-CM | POA: Diagnosis not present

## 2023-05-03 DIAGNOSIS — Z794 Long term (current) use of insulin: Secondary | ICD-10-CM | POA: Insufficient documentation

## 2023-05-03 DIAGNOSIS — R109 Unspecified abdominal pain: Secondary | ICD-10-CM | POA: Insufficient documentation

## 2023-05-03 DIAGNOSIS — I129 Hypertensive chronic kidney disease with stage 1 through stage 4 chronic kidney disease, or unspecified chronic kidney disease: Secondary | ICD-10-CM | POA: Insufficient documentation

## 2023-05-03 DIAGNOSIS — I2510 Atherosclerotic heart disease of native coronary artery without angina pectoris: Secondary | ICD-10-CM | POA: Diagnosis not present

## 2023-05-03 DIAGNOSIS — E109 Type 1 diabetes mellitus without complications: Secondary | ICD-10-CM | POA: Diagnosis not present

## 2023-05-03 DIAGNOSIS — N189 Chronic kidney disease, unspecified: Secondary | ICD-10-CM | POA: Insufficient documentation

## 2023-05-03 LAB — URINALYSIS, ROUTINE W REFLEX MICROSCOPIC
Bilirubin Urine: NEGATIVE
Glucose, UA: NEGATIVE mg/dL
Ketones, ur: NEGATIVE mg/dL
Nitrite: NEGATIVE
Protein, ur: NEGATIVE mg/dL
Specific Gravity, Urine: 1.003 — ABNORMAL LOW (ref 1.005–1.030)
pH: 7 (ref 5.0–8.0)

## 2023-05-03 LAB — BASIC METABOLIC PANEL
Anion gap: 9 (ref 5–15)
BUN: 18 mg/dL (ref 6–20)
CO2: 22 mmol/L (ref 22–32)
Calcium: 8.7 mg/dL — ABNORMAL LOW (ref 8.9–10.3)
Chloride: 105 mmol/L (ref 98–111)
Creatinine, Ser: 1.22 mg/dL — ABNORMAL HIGH (ref 0.44–1.00)
GFR, Estimated: 52 mL/min — ABNORMAL LOW (ref >60)
Glucose, Bld: 74 mg/dL (ref 70–99)
Potassium: 3.3 mmol/L — ABNORMAL LOW (ref 3.5–5.1)
Sodium: 136 mmol/L (ref 135–145)

## 2023-05-03 LAB — HEPATIC FUNCTION PANEL
ALT: 9 U/L (ref 0–44)
AST: 18 U/L (ref 15–41)
Albumin: 3.2 g/dL — ABNORMAL LOW (ref 3.5–5.0)
Alkaline Phosphatase: 126 U/L (ref 38–126)
Bilirubin, Direct: 0.1 mg/dL (ref 0.0–0.2)
Indirect Bilirubin: 0.2 mg/dL — ABNORMAL LOW (ref 0.3–0.9)
Total Bilirubin: 0.3 mg/dL (ref 0.3–1.2)
Total Protein: 7.7 g/dL (ref 6.5–8.1)

## 2023-05-03 LAB — CBC
HCT: 43.7 % (ref 36.0–46.0)
Hemoglobin: 14.1 g/dL (ref 12.0–15.0)
MCH: 28 pg (ref 26.0–34.0)
MCHC: 32.3 g/dL (ref 30.0–36.0)
MCV: 86.7 fL (ref 80.0–100.0)
Platelets: 302 10*3/uL (ref 150–400)
RBC: 5.04 MIL/uL (ref 3.87–5.11)
RDW: 15.3 % (ref 11.5–15.5)
WBC: 8.8 10*3/uL (ref 4.0–10.5)
nRBC: 0 % (ref 0.0–0.2)

## 2023-05-03 LAB — LIPASE, BLOOD: Lipase: 30 U/L (ref 11–51)

## 2023-05-03 MED ORDER — LACTATED RINGERS IV BOLUS
1000.0000 mL | Freq: Once | INTRAVENOUS | Status: AC
  Administered 2023-05-03: 1000 mL via INTRAVENOUS

## 2023-05-03 MED ORDER — MORPHINE SULFATE (PF) 4 MG/ML IV SOLN
4.0000 mg | Freq: Once | INTRAVENOUS | Status: AC
  Administered 2023-05-03: 4 mg via INTRAVENOUS
  Filled 2023-05-03: qty 1

## 2023-05-03 MED ORDER — ACETAMINOPHEN 500 MG PO TABS
1000.0000 mg | ORAL_TABLET | Freq: Once | ORAL | Status: AC
  Administered 2023-05-03: 1000 mg via ORAL
  Filled 2023-05-03: qty 2

## 2023-05-03 MED ORDER — IOHEXOL 350 MG/ML SOLN
60.0000 mL | Freq: Once | INTRAVENOUS | Status: AC | PRN
  Administered 2023-05-03: 60 mL via INTRAVENOUS

## 2023-05-03 MED ORDER — METHOCARBAMOL 500 MG PO TABS
500.0000 mg | ORAL_TABLET | Freq: Once | ORAL | Status: AC
  Administered 2023-05-03: 500 mg via ORAL
  Filled 2023-05-03: qty 1

## 2023-05-03 MED ORDER — POTASSIUM CHLORIDE CRYS ER 20 MEQ PO TBCR
40.0000 meq | EXTENDED_RELEASE_TABLET | Freq: Once | ORAL | Status: AC
  Administered 2023-05-03: 40 meq via ORAL
  Filled 2023-05-03: qty 2

## 2023-05-03 NOTE — ED Notes (Signed)
Provider at bedside attempting US IV

## 2023-05-03 NOTE — ED Provider Notes (Signed)
[facility] AT [facility] Provider Note   CSN: [id number] Arrival date & time: 05/03/23  1801     History  Chief Complaint  Patient presents with   Flank Pain    [name] [name] is a 57 y.o. female.   Flank Pain  57 year old female history of of type 1 diabetes, CKD, CAD, gastroparesis, GERD, hypertension presenting for left flank pain.  She states pain started today.  No precipitating onset.  Pain is in her left flank, similar to prior kidney stones.  She does not have any pain in the middle or right side of her abdomen.  No chest pain or shortness of breath.  Is worse with palpation and movement.  No dysuria or hematuria.  No vaginal bleeding or discharge.  No fevers or chills.  She has not had any cough or shortness of breath or pleuritic pain.  No rash or skin changes.     Home Medications Prior to Admission medications   Medication Sig Start Date End Date Taking? Authorizing Provider  acetaminophen (TYLENOL) 325 MG tablet Take 2 tablets (650 mg total) by mouth every 6 (six) hours as needed for mild pain or fever. 02/23/22   Hongalgi, Maximino Greenland, MD  albuterol (VENTOLIN HFA) 108 (90 Base) MCG/ACT inhaler TAKE 2 PUFFS BY MOUTH EVERY 6 HOURS AS NEEDED FOR WHEEZE OR SHORTNESS OF BREATH 09/15/22   Julieanne Manson, MD  ammonium lactate (AMLACTIN) 12 % cream Apply topically as needed for dry skin. 10/31/22   Vivi Barrack, DPM  ciclopirox (PENLAC) 8 % solution APPLY TOPICALLY AT BEDTIME. APPLY OVER NAIL AND SURROUNDING SKIN. APPLY DAILY OVER PREVIOUS COAT. AFTER SEVEN (7) DAYS, MAY REMOVE WITH ALCOHOL AND CONTINUE CYCLE. Patient not taking: Reported on 03/26/2023 03/07/23   Vivi Barrack, DPM  clopidogrel (PLAVIX) 75 MG tablet TAKE 1 TABLET BY MOUTH EVERY DAY 03/07/23   Corky Crafts, MD  Continuous Blood Gluc Receiver (DEXCOM G6 RECEIVER) DEVI AS DIRECTED 364 DAYS 04/08/21   [provider]  Continuous Blood Gluc Sensor (DEXCOM G6 SENSOR) MISC  SMARTSIG:Topical Once a Month 10/12/22   [provider]  Continuous Blood Gluc Transmit (DEXCOM G6 TRANSMITTER) MISC AS DIRECTED 90 DAYS 04/08/21   [provider]  [employer] D3 25 MCG (1000 UT) capsule TAKE 1 CAPSULE BY MOUTH EVERY DAY 10/04/22   Julieanne Manson, MD  cyclobenzaprine (FLEXERIL) 10 MG tablet TAKE 1-2 TABLETS (10-20 MG TOTAL) BY MOUTH AT BEDTIME AS NEEDED FOR MUSCLE SPASMS. MUSCLE SPASMS 03/30/23   Julieanne Manson, MD  diphenhydrAMINE (BENADRYL) 25 MG tablet Take by mouth. 07/04/19   [provider]  ENBREL SURECLICK 50 MG/ML injection Inject into the skin. 09/13/22   [provider]  famotidine (PEPCID) 20 MG tablet Take 20 mg by mouth 2 (two) times daily. Taking 1 tab twice daily only as needed generally once monthly Patient not taking: Reported on 03/26/2023 09/11/20   [provider]  folic acid (FOLVITE) 1 MG tablet Take 1 mg by mouth daily.    [provider]  furosemide (LASIX) 40 MG tablet TAKE 1 TABLET BY MOUTH EVERY DAY IN THE MORNING 04/06/23   Julieanne Manson, MD  gabapentin (NEURONTIN) 100 MG capsule Take 1 capsule by mouth at bedtime. Patient not taking: Reported on 02/15/2023 10/13/19   [provider]  HUMALOG KWIKPEN 100 UNIT/ML KwikPen Inject 100 Units into the skin.  3 UNITS AT BREAKFAST, 3 units at LUNCH, 3 UNITS AT EVENING MEAL, PLUS 2  EXTRA UNITS IF OVER 300 MG/DL Subcutaneous (max daily dose of 24 units) Subcutaneous for 90 days 11/16/22   [provider]  hydrOXYzine (ATARAX) 25 MG tablet TAKE 1 TABLET BY MOUTH TWICE A DAY 01/05/23   Julieanne Manson, MD  Incontinence Supply Disposable (POISE ULTRA THINS) PADS Use 5 pads daily as needed for urinary stress incontinence 01/03/16   Julieanne Manson, MD  insulin aspart (NOVOLOG) 100 UNIT/ML injection Inject 3 Units into the skin 3 (three) times daily with meals. Per Sliding scale CBG 100-150: 4 units, 151-200: 6 units, 201-250: 8 units, 251-300: 10  units, 301-350: 12 units, 351-400: 14 units, > 401; call doctor Patient not taking: Reported on 02/15/2023    [provider]  LANTUS SOLOSTAR 100 UNIT/ML Solostar Pen Inject 12 Units into the skin 2 (two) times daily. 02/23/22   Hongalgi, Maximino Greenland, MD  metoprolol tartrate (LOPRESSOR) 25 MG tablet 1 1/2 tabs by mouth twice daily 04/27/23   Julieanne Manson, MD  nitroGLYCERIN (NITROSTAT) 0.4 MG SL tablet PLACE 1 TABLET UNDER THE TONGUE EVERY 5 MINUTES AS NEEDED FOR CHEST PAIN. MAX 3 DOSES, CALL 911. 11/02/22   Corky Crafts, MD  nystatin (MYCOSTATIN/NYSTOP) powder APPLY TO AFFECTED AREA TWICE A DAY 01/24/23   Julieanne Manson, MD  ondansetron (ZOFRAN) 4 MG tablet 1 TAB BY MOUTH EVERY 6 HOURS AS NEEDED FOR NAUSEA AND VOMITING. 03/09/23   Julieanne Manson, MD  pantoprazole (PROTONIX) 40 MG tablet Take 40 mg by mouth 2 (two) times daily.    [provider]  Pramlintide Acetate 1500 MCG/1.5ML SOPN Inject 15 mcg into the skin 3 (three) times daily before meals.    [provider]  prednisoLONE acetate (PRED FORTE) 1 % ophthalmic suspension Place 4-5 drops into the left eye daily. 04/22/17   [provider]  rosuvastatin (CRESTOR) 40 MG tablet TAKE 1 TABLET BY MOUTH EVERY DAY 03/09/23   Julieanne Manson, MD  sertraline (ZOLOFT) 100 MG tablet 1 1/2 TABS BY MOUTH DAILY Patient taking differently: 1 tab by mouth daily 08/10/22   Julieanne Manson, MD  Study - [name](A) - olpasiran (AMG 890) 142 mg/mL or placebo SQ injection (PI-Hilty) Inject 142 mg into the skin once. For Investigational Use Only. Inject 1 mL (1 prefilled syringe) subcutaneously into appropriate injection site per protocol every 12 weeks in the clinic. (Approved injection site(s): upper arm, upper thigh & abdomen). Please contact [facility] with any questions or concerns regarding this medication.    Chrystie Nose, MD  traMADol (ULTRAM) 50 MG tablet TAKE 1 TABLET EVERY 6 HOURS AS NEEDED FOR  MODERATE PAIN 03/06/17   Julieanne Manson, MD      Allergies    Aspirin    Review of Systems   Review of Systems  Genitourinary:  Positive for flank pain.  Review of systems completed and notable as per HPI.  ROS otherwise negative.   Physical Exam Updated Vital Signs BP 126/65 (BP Location: Right Arm)   Pulse 81   Temp 97.9 F (36.6 C) (Oral)   Resp 18   LMP 01/04/2015   SpO2 98%  Physical Exam Vitals and nursing note reviewed.  Constitutional:      General: She is not in acute distress.    Appearance: She is well-developed.  HENT:     Head: Normocephalic and atraumatic.     Mouth/Throat:     Mouth: Mucous membranes are moist.     Pharynx: Oropharynx is clear.  Eyes:  Extraocular Movements: Extraocular movements intact.     Conjunctiva/sclera: Conjunctivae normal.     Pupils: Pupils are equal, round, and reactive to light.  Cardiovascular:     Rate and Rhythm: Normal rate and regular rhythm.     Pulses: Normal pulses.     Heart sounds: Normal heart sounds. No murmur heard. Pulmonary:     Effort: Pulmonary effort is normal. No respiratory distress.     Breath sounds: Normal breath sounds.  Abdominal:     Palpations: Abdomen is soft.     Tenderness: There is abdominal tenderness. There is no right CVA tenderness, left CVA tenderness, guarding or rebound.     Comments: Mild tenderness of the left lateral upper abdomen.  No tenderness over the ribs, no CVA tenderness.  Tenderness is quite superficial over the skin and soft tissue.  No swelling, erythema, warmth.  No tenderness in the right upper quadrant or right lower quadrant.  Musculoskeletal:        General: No swelling.     Cervical back: Neck supple.     Right lower leg: No edema.     Left lower leg: No edema.  Skin:    General: Skin is warm and dry.     Capillary Refill: Capillary refill takes less than 2 seconds.  Neurological:     General: No focal deficit present.     Mental Status: She is alert and  oriented to person, place, and time. Mental status is at baseline.  Psychiatric:        Mood and Affect: Mood normal.     ED Results / Procedures / Treatments   Labs (all labs ordered are listed, but only abnormal results are displayed) Labs Reviewed  URINALYSIS, ROUTINE W REFLEX MICROSCOPIC - Abnormal; Notable for the following components:      Result Value   Color, Urine STRAW (*)    Specific Gravity, Urine 1.003 (*)    Hgb urine dipstick MODERATE (*)    Leukocytes,Ua TRACE (*)    Bacteria, UA RARE (*)    All other components within normal limits  HEPATIC FUNCTION PANEL - Abnormal; Notable for the following components:   Albumin 3.2 (*)    Indirect Bilirubin 0.2 (*)    All other components within normal limits  BASIC METABOLIC PANEL - Abnormal; Notable for the following components:   Potassium 3.3 (*)    Creatinine, Ser 1.22 (*)    Calcium 8.7 (*)    GFR, Estimated 52 (*)    All other components within normal limits  CBC  LIPASE, BLOOD    EKG EKG Interpretation Date/Time:  Thursday May 03 2023 21:35:49 EDT Ventricular Rate:  78 PR Interval:  177 QRS Duration:  107 QT Interval:  392 QTC Calculation: 447 R Axis:   95  Text Interpretation: Sinus rhythm Probable left atrial enlargement Borderline right axis deviation Nonspecific T abnormalities, inferior leads No significant change since last tracing Confirmed by Fulton Reek [phone]) on 05/03/2023 9:38:44 PM  Radiology CT ABDOMEN PELVIS W CONTRAST  Result Date: 05/03/2023 CLINICAL DATA:  Acute left abdominal and flank pain beginning today. Nephrolithiasis. EXAM: CT ABDOMEN AND PELVIS WITH CONTRAST TECHNIQUE: Multidetector CT imaging of the abdomen and pelvis was performed using the standard protocol following bolus administration of intravenous contrast. RADIATION DOSE REDUCTION: This exam was performed according to the departmental dose-optimization program which includes automated exposure control, adjustment of  the mA and/or kV according to patient size and/or use of iterative reconstruction technique. CONTRAST:  60mL OMNIPAQUE IOHEXOL 350 MG/ML SOLN COMPARISON:  02/19/2022 FINDINGS: Lower Chest: No acute findings. Hepatobiliary: No suspicious hepatic masses identified. Gallbladder is unremarkable. No evidence of biliary ductal dilatation. Pancreas:  No mass or inflammatory changes. Spleen: Within normal limits in size and appearance. Adrenals/Urinary Tract: No suspicious masses identified. Mild chronic left renal parenchymal scarring noted. Multiple bilateral renal calculi are seen, largest measuring 10 mm in lower pole of left kidney. No evidence of ureteral calculi or hydronephrosis. Unremarkable unopacified urinary bladder. Stomach/Bowel: No evidence of obstruction, inflammatory process or abnormal fluid collections. Vascular/Lymphatic: No pathologically enlarged lymph nodes. No acute vascular findings. Reproductive: Multiple partially calcified uterine fibroids are again seen, largest measuring approximately 5 cm. Adnexal regions are unremarkable. Other:  None. Musculoskeletal:  No suspicious bone lesions identified. IMPRESSION: Bilateral nephrolithiasis. No evidence of ureteral calculi, hydronephrosis, or other acute findings. Multiple uterine fibroids, largest measuring 5 cm. Electronically Signed   By: Danae Orleans M.D.   On: 05/03/2023 20:56    Procedures Procedures    Medications Ordered in ED Medications  morphine (PF) 4 MG/ML injection 4 mg (4 mg Intravenous Given 05/03/23 1913)  lactated ringers bolus 1,000 mL (1,000 mLs Intravenous New Bag/Given 05/03/23 1913)  morphine (PF) 4 MG/ML injection 4 mg (4 mg Intravenous Given 05/03/23 2038)  iohexol (OMNIPAQUE) 350 MG/ML injection 60 mL (60 mLs Intravenous Contrast Given 05/03/23 2031)  acetaminophen (TYLENOL) tablet 1,000 mg (1,000 mg Oral Given 05/03/23 2124)  methocarbamol (ROBAXIN) tablet 500 mg (500 mg Oral Given 05/03/23 2137)    ED Course/ Medical  Decision Making/ A&P Clinical Course as of 05/03/23 2237  Thu May 03, 2023  2113 CT ABDOMEN PELVIS W CONTRAST [JD]    Clinical Course User Index [JD] Laurence Spates, MD                                 Medical Decision Making Amount and/or Complexity of Data Reviewed Labs: ordered. Radiology: ordered. Decision-making details documented in ED Course.  Risk OTC drugs. Prescription drug management.   Medical Decision Making:   Asianae Foronda is a 57 y.o. female who presented to the ED today with left flank pain.  All signs reviewed.  On exam she is well-appearing.  She has very reproducible pain over her left lateral flank.  She has no actual CVA tenderness.  Differential occluding kidney stone, nephritis, obstruction, gastritis.  Plan to obtain CT scan, treat symptomatically.  EKG shows some inferior T wave inversions unchanged from prior, pain is very much reproducible on exam and she has no chest pain or shortness of breath I do not think this is an anginal equivalent.   Patient placed on continuous vitals and telemetry monitoring while in ED which was reviewed periodically.  Reviewed and confirmed nursing documentation for past medical history, family history, social history.  Reassessment and Plan:   Blood work here is unremarkable.  CBC without leukocytosis.  K slightly low at 3.3.  Renal functions at baseline.  Lipase is normal.  She has rare bacteria but no white blood cells or blood on her urinalysis.  Her CT scan shows stones in the kidneys but no ureteral stones or hydronephrosis to explain her symptoms.  She has no clinical or CT findings concerning for pyelonephritis and urinalysis is not consistent with this.  No signs of appendicitis, cholecystitis, bowel obstruction.  On reassessment she still tender.  I wonder if there could be musculoskeletal  component.  I gave her some Robaxin and that significantly improved her pain.  She is tolerant p.o. and ambulated without difficulty.   Unclear exactly what is causing her pain but her work appears quite reassuring.  I recommend she follow close with a PCP and given strict return precautions.  Discharged in stable condition.   Patient's presentation is most consistent with acute complicated illness / injury requiring diagnostic workup.           Final Clinical Impression(s) / ED Diagnoses Final diagnoses:  Left flank pain    Rx / DC Orders ED Discharge Orders     None         Laurence Spates, MD 05/03/23 2238

## 2023-05-03 NOTE — Discharge Instructions (Signed)
Your CT scan and lab work today were reassuring other than slightly low potassium.  I recommend you follow-up closely with your primary care doctor.  If you develop severe pain, fever, inability to eat or drink, chest pain or shortness of breath you should return to the ED.

## 2023-05-03 NOTE — ED Triage Notes (Signed)
Patient bib [facility] from home with complaints of left sided abdominal pain in the flank area. It started today and has progressively gortten worse and she states the pain is a 12/10. EMS reports rebound pain, no blood in the urine, and hasn't been able to eat or drink much today. 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

## 2023-05-07 ENCOUNTER — Other Ambulatory Visit: Payer: Self-pay | Admitting: Internal Medicine

## 2023-05-31 ENCOUNTER — Other Ambulatory Visit: Payer: Self-pay | Admitting: Internal Medicine

## 2023-05-31 ENCOUNTER — Ambulatory Visit (INDEPENDENT_AMBULATORY_CARE_PROVIDER_SITE_OTHER): Payer: Medicaid Other | Admitting: Podiatry

## 2023-05-31 ENCOUNTER — Encounter: Payer: Self-pay | Admitting: Podiatry

## 2023-05-31 ENCOUNTER — Ambulatory Visit: Payer: Medicaid Other | Admitting: Internal Medicine

## 2023-05-31 ENCOUNTER — Encounter: Payer: Self-pay | Admitting: Internal Medicine

## 2023-05-31 VITALS — BP 144/78 | HR 86 | Resp 20 | Ht 60.0 in | Wt 202.0 lb

## 2023-05-31 DIAGNOSIS — Z720 Tobacco use: Secondary | ICD-10-CM

## 2023-05-31 DIAGNOSIS — N898 Other specified noninflammatory disorders of vagina: Secondary | ICD-10-CM

## 2023-05-31 DIAGNOSIS — Z0000 Encounter for general adult medical examination without abnormal findings: Secondary | ICD-10-CM | POA: Diagnosis not present

## 2023-05-31 DIAGNOSIS — Z124 Encounter for screening for malignant neoplasm of cervix: Secondary | ICD-10-CM | POA: Diagnosis not present

## 2023-05-31 DIAGNOSIS — E108 Type 1 diabetes mellitus with unspecified complications: Secondary | ICD-10-CM

## 2023-05-31 DIAGNOSIS — Z23 Encounter for immunization: Secondary | ICD-10-CM

## 2023-05-31 DIAGNOSIS — B351 Tinea unguium: Secondary | ICD-10-CM | POA: Diagnosis not present

## 2023-05-31 DIAGNOSIS — E1149 Type 2 diabetes mellitus with other diabetic neurological complication: Secondary | ICD-10-CM | POA: Diagnosis not present

## 2023-05-31 DIAGNOSIS — M79674 Pain in right toe(s): Secondary | ICD-10-CM

## 2023-05-31 DIAGNOSIS — Z716 Tobacco abuse counseling: Secondary | ICD-10-CM

## 2023-05-31 DIAGNOSIS — M8589 Other specified disorders of bone density and structure, multiple sites: Secondary | ICD-10-CM | POA: Diagnosis not present

## 2023-05-31 DIAGNOSIS — Z5987 Material hardship due to limited financial resources, not elsewhere classified: Secondary | ICD-10-CM

## 2023-05-31 LAB — POCT WET PREP WITH KOH
KOH Prep POC: NEGATIVE
RBC Wet Prep HPF POC: NEGATIVE
Trichomonas, UA: NEGATIVE
Yeast Wet Prep HPF POC: NEGATIVE

## 2023-05-31 MED ORDER — NICOTINE POLACRILEX 4 MG MT GUM: 4.0000 mg | CHEWING_GUM | OROMUCOSAL | 6 refills | Status: AC | PRN

## 2023-05-31 MED ORDER — GABAPENTIN 100 MG PO CAPS: 100.0000 mg | ORAL_CAPSULE | Freq: Every day | ORAL | 3 refills | Status: AC

## 2023-05-31 MED ORDER — CITRACAL SLOW RELEASE 600-40-500 MG-MG-UNIT PO TB24: ORAL_TABLET | ORAL | Status: AC

## 2023-05-31 NOTE — Patient Instructions (Signed)
Drink a glass of water before every meal Drink 6-8 glasses of water daily Eat three meals daily Eat a protein and healthy fat with every meal (eggs,fish, chicken, turkey and limit red meats) Eat 5 servings of vegetables daily, mix the colors Eat 2 servings of fruit daily with skin, if skin is edible Use smaller plates Put food/utensils down as you chew and swallow each bite Eat at a table with friends/family at least once daily, no TV Do not eat in front of the TV  Recent studies show that people who consume all of their calories in a 12 hour period lose weight more efficiently.  For example, if you eat your first meal at 7:00 a.m., your last meal of the day should be completed by 7:00 p.m. 

## 2023-05-31 NOTE — Progress Notes (Signed)
Subjective:    Patient ID: [name] [name], female   DOB: October 31, 1965, 57 y.o.   MRN: [id number]   HPI  CPE with pap  1.  Pap:  Last in 09/2019 and normal.  Always normal.    2.  Mammogram:  last Feb 21, 2023 and normal.  Mother died age 66 of breast cancer within a year of diagnosis.    3.  Osteoprevention:  1 cup almond milk daily.  Also Vitamin D3.  Walks dogs for 30 minutes twice daily.  DEXA showed osteopenia in 2021.  4.  Guaiac Cards/FIT: Last in 2018 and negative.    5.  Colonoscopy:  Last colonoscopy was in Sept 2019 and no polyps.  Next colonoscopy set for 2029 with Dr. Marca Ancona, [facility].  Two maternal uncles died of colon cancer in their 80s    6.  Immunizations:  Needs COVID booster.  Has not had second Hep A vaccine. Immunization History  Administered Date(s) Administered   H1N1 07/21/2008   Hep A / Hep B 09/13/2020   Hepatitis B 01/21/2015, 08/05/2015   Hepatitis B, ADULT 01/21/2015, 08/05/2015, 02/08/2016   Influenza Split 03/22/2020   Influenza Whole 05/02/2006, 07/21/2008, 05/04/2009, 04/28/2010   Influenza, Quadrivalent, Recombinant, Inj, Pf 04/16/2019   Influenza, Seasonal, Injecte, Preservative Fre 05/07/2023   Influenza,inj,Quad PF,6+ Mos 05/22/2017   Influenza-Unspecified 08/01/2015, 05/30/2016, 04/17/2018, 04/16/2019, 05/11/2022   Moderna SARS-COV2 Booster Vaccination 11/22/2020   Moderna Sars-Covid-2 Vaccination 09/29/2019, 10/27/2019, 05/31/2020   PPD Test 12/18/2011   Pneumococcal Conjugate-13 11/09/2015   Pneumococcal Polysaccharide-23 10/31/2005, 20-Feb-2018   Td 06/02/2004   Tdap 11/09/2015   Zoster Recombinant(Shingrix) 07/31/2010, 05/24/2022     7.  Glucose/Cholesterol:  Last A1C was 6.9% or 7.2% at Dr. Daune Perch office this month.  He is trying to get her signed up on [facility].  In meantime, she is back to smoking 5 cigs daily due to her weight gain.  She was bringing a lot of junk food again.   Cholesterol not at goal in April with LDL 91.   Lipid Panel      Component Value Date/Time   CHOL 181 11/17/2022 1207   TRIG 85 11/17/2022 1207   HDL 75 11/17/2022 1207   CHOLHDL 2.6 09/14/2021 1336   CHOLHDL 2.1 06/16/2018 0642   VLDL 14 06/16/2018 0642   LDLCALC 91 11/17/2022 1207   LABVLDL 15 11/17/2022 1207       Allergies  Allergen Reactions   Aspirin Other (See Comments)    Stomach bleeds  Other Reaction(s): Other (See Comments)  Stomach bleeds, GI bleeding   Past Medical History:  Diagnosis Date   Acute myocardial infarction of other lateral wall, initial episode of care    Acute myocardial infarction, unspecified site, initial episode of care    Acute osteomyelitis    Acute osteomyelitis of left foot (HCC) 02/15/2018   Acute renal failure superimposed on stage 3a chronic kidney disease (HCC)    Allergic rhinitis    Anemia    Anginal pain (HCC)    07/15/13- no chest pain in months"   Anxiety    Bipolar affective (HCC)    CAD (coronary artery disease) 07/2011   s/p DES mid and distal RCA with 50% LAD   CKD stage 3 due to type 1 diabetes mellitus (HCC) 02/21/2015   Closed fracture of distal end of left radius 04/22/2020   Diabetic foot ulcer associated with diabetes mellitus due to underlying condition (HCC) 09/26/2018   Diabetic gastroparesis associated with type  1 diabetes mellitus (HCC)    Diabetic peripheral neuropathy associated with type 1 diabetes mellitus (HCC)    Diabetic retinopathy    Esophageal stenosis    Esophageal ulcer    Esophagitis    Gastroparesis    Genital herpes    Reportedly tested and documented by [facility]--rare occurrences   GERD (gastroesophageal reflux disease)    Heart murmur    History of stomach ulcers    Hypertension    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   [name] on 12/19/2012, "that's the only one I've had" (12/19/2012)   Migraines    Mixed diabetic hyperlipidemia associated with type 1 diabetes mellitus (HCC)    Nausea and vomiting 02/15/2018   Nausea vomiting and diarrhea 12/29/2012    Onychomycosis 02/07/2021   Orthopnoea    Paronychia of finger, left 02/07/2021   Pneumonia 2012   Polysubstance abuse (HCC)    Crack cocaine--none since 2008, MJ, ETOH:  clean of all since 2008   Rectal bleeding 01/10/2021   Renal lithiasis    Bilateral   Rheumatoid arthritis (HCC) 12/15/2008   Qualifier: Diagnosis of   By: Barbaraann Barthel MD, [place]       Sebaceous cyst    Severe sepsis (HCC) 02/19/2022   Stroke (HCC) 2011   denies residual on 12/19/2012.  "Years ago"   Tinea cruris 05/13/2019   Type 1 diabetes mellitus with multiple complications (HCC) 06/21/2006   Centricity Description: GASTROPARESIS, DIABETIC  Qualifier: Diagnosis of   By: Barbaraann Barthel MD, [place]      Centricity Description: DIABETIC PERIPHERAL NEUROPATHY  Qualifier: Diagnosis of   By: Barbaraann Barthel MD, [place]      Diabetic Retinopathy     CKD     ASCAD       Unstable angina (HCC)    Vitamin D deficiency    Past Surgical History:  Procedure Laterality Date   CORONARY ANGIOPLASTY WITH STENT PLACEMENT  01/20/2009   "2" (12/19/2012)   CORONARY ANGIOPLASTY WITH STENT PLACEMENT  12/19/2012   "2" (12/19/2012)   CORONARY PRESSURE/FFR STUDY N/A 08/13/2020   Procedure: INTRAVASCULAR PRESSURE WIRE/FFR STUDY;  Surgeon: Yvonne Kendall, MD;  Location: [facility];  Service: Cardiovascular;  Laterality: N/A;   CORONARY STENT INTERVENTION N/A 08/13/2020   Procedure: CORONARY STENT INTERVENTION;  Surgeon: Yvonne Kendall, MD;  Location: [facility];  Service: Cardiovascular;  Laterality: N/A;   CYSTOSCOPY W/ URETERAL STENT PLACEMENT Left 02/19/2022   Procedure: CYSTOSCOPY WITH URETERAL STENT PLACEMENT;  Surgeon: Bjorn Pippin, MD;  Location: [facility];  Service: Urology;  Laterality: Left;   CYSTOSCOPY/URETEROSCOPY/HOLMIUM LASER/STENT PLACEMENT Left 03/14/2022   Procedure: CYSTOSCOPY LEFT URETEROSCOPY/HOLMIUM LASER/STENT EXCHANGE;  Surgeon: Bjorn Pippin, MD;  Location: [facility];  Service: Urology;  Laterality: Left;  1 HR FOR CASE    ESOPHAGOGASTRODUODENOSCOPY N/A 02/26/2015   Procedure: ESOPHAGOGASTRODUODENOSCOPY (EGD);  Surgeon: Dorena Cookey, MD;  Location[facility] ENDOSCOPY;  Service: Endoscopy;  Laterality: N/A;   EYE SURGERY     Multiple surgeries of both eyes, including unsuccessful corneal transplant, left eye:  last laser was 09/08/2015 of right eye.  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   GAS INSERTION Left 07/16/2013   Procedure: INSERTION OF GAS;  Surgeon: Shade Flood, MD;  Location: [facility] OR;  Service: Ophthalmology;  Laterality: Left;  SF6   GAS/FLUID EXCHANGE Left 07/30/2013   Procedure: GAS/FLUID EXCHANGE;  Surgeon: Shade Flood, MD;  Location: [facility] OR;  Service: Ophthalmology;  Laterality: Left;   IRRIGATION AND DEBRIDEMENT SEBACEOUS  CYST Right 03/01/2011   "pointer" (12/19/2012)   LEFT HEART CATH AND CORONARY ANGIOGRAPHY N/A 08/13/2020   Procedure: LEFT HEART CATH AND CORONARY ANGIOGRAPHY;  Surgeon: Yvonne Kendall, MD;  Location: [facility];  Service: Cardiovascular;  Laterality: N/A;   LEFT HEART CATHETERIZATION WITH CORONARY ANGIOGRAM N/A 07/06/2011   Procedure: LEFT HEART CATHETERIZATION WITH CORONARY ANGIOGRAM;  Surgeon: Corky Crafts, MD;  Location: [facility] CATH LAB;  Service: Cardiovascular;  Laterality: N/A;  possible PCI   LEFT HEART CATHETERIZATION WITH CORONARY ANGIOGRAM N/A 12/19/2012   Procedure: LEFT HEART CATHETERIZATION WITH CORONARY ANGIOGRAM;  Surgeon: Corky Crafts, MD;  Location: [facility] CATH LAB;  Service: Cardiovascular;  Laterality: N/A;   MEMBRANE PEEL Left 07/16/2013   Procedure: MEMBRANE PEEL;  Surgeon: Shade Flood, MD;  Location: [facility] OR;  Service: Ophthalmology;  Laterality: Left;   PARS PLANA VITRECTOMY Left 07/16/2013   Procedure: PARS PLANA VITRECTOMY WITH 23 GAUGE;  Surgeon: Shade Flood, MD;  Location: [facility] OR;  Service: Ophthalmology;  Laterality: Left;   PARS PLANA VITRECTOMY Left 07/30/2013   Procedure: PARS PLANA VITRECTOMY WITH 23 GAUGE WITH  ENDOLASER;  Surgeon: Shade Flood, MD;  Location: [facility] OR;  Service: Ophthalmology;  Laterality: Left;  with endolaser   PERCUTANEOUS CORONARY STENT INTERVENTION (PCI-S) N/A 07/06/2011   Procedure: PERCUTANEOUS CORONARY STENT INTERVENTION (PCI-S);  Surgeon: Corky Crafts, MD;  Location: [facility] CATH LAB;  Service: Cardiovascular;  Laterality: N/A;   PHOTOCOAGULATION WITH LASER Left 07/16/2013   Procedure: PHOTOCOAGULATION WITH LASER;  Surgeon: Shade Flood, MD;  Location: [facility] OR;  Service: Ophthalmology;  Laterality: Left;  ENDOLASER   Family History  Problem Relation Age of Onset   Breast cancer Mother 63   Hypertension Father    Stomach cancer Sister 72       cause of death   Hypertension Sister    Lung cancer Sister 37       Cause of death   Cancer Sister 71       ? stomach cancer   Hypertension Daughter    Cancer Maternal Uncle        2 uncles died from colon cancer in 83s   Social History   Socioeconomic History   Marital status: Divorced    Spouse name: Not on file   Number of children: 1   Years of education: 12   Highest education level: [employer] school graduate  Occupational History   Occupation: Disabled   Tobacco Use   Smoking status: Every Day    Current packs/day: 0.75    Average packs/day: 0.8 packs/day for 30.0 years (22.5 ttl pk-yrs)    Types: Cigarettes   Smokeless tobacco: Never   Tobacco comments:    Has been to classes--not sure if she wants to quit.  Changing to non menthol..  Chantix, patches, gum never helped.  9/19:  lot going on.  Nicotine nasal spray--makes her dream about using cocaine and so stopped. 05/2023:  smoking 5 cigs daily again.    Vaping Use   Vaping status: Never Used  Substance and Sexual Activity   Alcohol use: Yes    Comment: Rare.wine   Drug use: Not Currently    Types: "Crack" cocaine, Marijuana    Comment: 12/31/2012 " clean from crack.  Does still use edibles (marijuana) rarely   Sexual activity: Not Currently    Partners: Male     Birth control/protection: None  Other Topics Concern   Not on file  Social History Narrative   Has  lived in [place] for most of life   Disabled   Previously went to [employer] school and worked as Conservation officer, nature.   Lives by herself near [place].   Divorced in 2015.    She is caring for a middle school aged girl, [name] [name], also a patient here.  [name]'s father contacted Tinamarie on social media and left daughter with her reportedly while he went to drug rehab, which reportedly is now not the case.  He finally signed custody over to Nura.     Social Determinants of Health   Financial Resource Strain: Low Risk  (05/31/2023)   Overall Financial Resource Strain (CARDIA)    Difficulty of Paying Living Expenses: Not hard at all  Food Insecurity: No Food Insecurity (05/31/2023)   Hunger Vital Sign    Worried About Running Out of Food in the Last Year: Never true    Ran Out of Food in the Last Year: Never true  Transportation Needs: No Transportation Needs (05/31/2023)   PRAPARE - Administrator, Civil Service (Medical): No    Lack of Transportation (Non-Medical): No  Physical Activity: Not on file  Stress: Not on file  Social Connections: Unknown (12/13/2021)   [facility]   Social Network    Social Network: Not on file  Intimate Partner Violence: Not At Risk (05/31/2023)   Humiliation, Afraid, Rape, and Kick questionnaire    Fear of Current or Ex-Partner: No    Emotionally Abused: No    Physically Abused: No    Sexually Abused: No      Review of Systems  HENT:  Negative for dental problem (dentures upper).   Eyes:  Positive for visual disturbance (No vision in left.  Vision on left stable.  Sees retinal specialist [facility] once yearly.).  Respiratory:  Negative for shortness of breath.   Cardiovascular:  Positive for chest pain (right under her right breast.  Sharp after eating.  Both Ntg and alka seltzer help.) and leg swelling (Mild and  notes once weekly.    Notes after salty meal.).  Neurological:  Positive for weakness and numbness.     Objective:   BP (!) 144/78 (BP Location: Left Arm, Patient Position: Sitting, Cuff Size: Normal)   Pulse 86   Resp 20   Ht 5' (1.524 m)   Wt 202 lb (91.6 kg)   LMP 01/04/2015   BMI 39.45 kg/m   Physical Exam Constitutional:      Appearance: She is obese.  HENT:     Head: Normocephalic and atraumatic.     Right Ear: Tympanic membrane, ear canal and external ear normal.     Left Ear: Tympanic membrane, ear canal and external ear normal.     Nose: Nose normal.     Mouth/Throat:     Mouth: Mucous membranes are moist.     Pharynx: Oropharynx is clear.     Comments: Upper dentures.   She has her own lower incisors/bicuspids.  Rest of lower teeth missing. Eyes:     Extraocular Movements: Extraocular movements intact.     Conjunctiva/sclera: Conjunctivae normal.     Pupils: Pupils are equal, round, and reactive to light.     Comments: Left eye atrophic and scarred over.  Lid hangs down over eye.  Neck:     Thyroid: Thyromegaly present. No thyroid mass.  Cardiovascular:     Rate and Rhythm: Normal rate and regular rhythm.     Heart sounds: S1  normal and S2 normal. No murmur heard.    No friction rub. No S3 or S4 sounds.     Comments: No carotid bruits.  Carotid, radial, femoral, DP and PT pulses normal and equal.   Pulmonary:     Effort: Pulmonary effort is normal.     Breath sounds: Normal breath sounds and air entry.  Chest:  Breasts:    Right: No inverted nipple, mass or nipple discharge.     Left: No inverted nipple, mass or nipple discharge.  Abdominal:     General: Bowel sounds are normal.     Palpations: Abdomen is soft. There is no hepatomegaly, splenomegaly or mass.     Tenderness: There is no abdominal tenderness.     Hernia: No hernia is present.  Genitourinary:    Comments: Normal external female genitalia Moderate amount of yellow vaginal discharge about  cervix. No underlying cervical or vaginal mucosa lesion or erythema. Bimanual exam limited as difficult relaxing and size. No uterine or adnexal mass or tenderness.   Musculoskeletal:        General: Normal range of motion.     Cervical back: Normal range of motion and neck supple.     Right lower leg: No edema.     Left lower leg: No edema.  Feet:     Right foot:     Protective Sensation: 10 sites tested.  10 sites sensed.     Skin integrity: Dry skin present.     Toenail Condition: Right toenails are abnormally thick. Fungal disease present.    Left foot:     Protective Sensation: 10 sites tested.  10 sites sensed.     Skin integrity: Dry skin present.     Toenail Condition: Left toenails are abnormally thick. Fungal disease present. Lymphadenopathy:     Head:     Right side of head: No submental or submandibular adenopathy.     Left side of head: No submental or submandibular adenopathy.     Cervical: No cervical adenopathy.     Upper Body:     Right upper body: No supraclavicular or axillary adenopathy.     Left upper body: No supraclavicular or axillary adenopathy.     Lower Body: No right inguinal adenopathy. No left inguinal adenopathy.  Skin:    General: Skin is warm.     Capillary Refill: Capillary refill takes less than 2 seconds.     Findings: No rash.  Neurological:     General: No focal deficit present.     Mental Status: She is alert and oriented to person, place, and time.     Cranial Nerves: Cranial nerves 2-12 are intact.     Sensory: Sensation is intact.     Motor: Motor function is intact.     Coordination: Coordination is intact.     Gait: Gait is intact.     Deep Tendon Reflexes: Reflexes are normal and symmetric.     Comments: See left eye findings in HEENT  Psychiatric:        Mood and Affect: Mood normal.        Speech: Speech normal.        Behavior: Behavior normal. Behavior is cooperative.      Assessment & Plan   CPE with pap Mammogram  completed this year. Followed by Dr. Marca Ancona for GI surveillance Hep A #2/2 today. Return for COVID when private supply comes in.   2.  Vaginal discharge:  GC/chlamyida.  BV on wet  prep--otherwise normal.  She is asymptomatic, so will not treat.  3.  DM and obesity:  she is in process of getting PA for [facility].  Encouraged her to not bring junk food in home.  Has been fairly well controlled with regards to DM in recent years. Has endocrine/eye specialists for her complications.  4.  CAD/hyperlipidemia:  followed by cardiology.  Fasting labs in 1 week.  5.  Osteopenia:  Citrated calcium and cup of milk daily as well as daily D3 with history of low D in past.  DEXA repeat.    6.  Tobacco abuse to keep weight down:  encouraged her to stop again--use nicotine gum instead.  Keep junk food out of house instead as well.    7.  Need for school clothing for [place].  Referral to SW/case management.

## 2023-06-01 LAB — MICROALBUMIN / CREATININE URINE RATIO
Creatinine, Urine: 19.8 mg/dL
Microalb/Creat Ratio: 216 mg/g{creat} — ABNORMAL HIGH (ref 0–29)
Microalbumin, Urine: 42.8 ug/mL

## 2023-06-01 LAB — CYTOLOGY - PAP

## 2023-06-01 NOTE — Telephone Encounter (Signed)
Can you check with pharmacy about this prescription. It IS a 90 day prescription. It was also filled in June of this year for a year's worth of refills.   Has she already used the year's refills?

## 2023-06-04 LAB — GC/CHLAMYDIA PROBE AMP
Chlamydia trachomatis, NAA: NEGATIVE
Neisseria Gonorrhoeae by PCR: NEGATIVE

## 2023-06-04 NOTE — Progress Notes (Signed)
Subjective: Chief Complaint  Patient presents with   [facility]    [facility]- Dry skin on heels only concern    57 year old female presents the above concerns.  No open lesions.  Toenails are thickened elongated she has difficulty trimming himself and causing pain.  She has no new concerns today.  PCP: Julieanne Manson, MD-last seen May 31, 2023 Endocrinologist: Dr. Talmage Coin, MD- last seen April 24, 2023  Last A1c 6.7 on 11/17/2022  Objective: AAO 3, NAD DP/PT pulses palpable, CRT less than 3 seconds Sensation decreased with Semmes Weinstein monofilament. Nails hypertrophic, dystrophic, elongated, brittle, discolored 10. There is tenderness overlying the nails 1-5 bilaterally. There is no surrounding erythema or drainage along the nail sites. Calluses much improved since using the AmLactin. No ulcerations present bilaterally. Hammertoes present No pain with calf compression, swelling, warmth, erythema.  Symptomatic onychomycosis, hyperkeratotic lesions  Plan: -Treatment options including alternatives, risks, complications were discussed -Nails sharply debrided 10 without complication/bleeding. -Discussed daily foot inspection. If there are any changes, to call the office immediately.  -Follow-up in 3 months or sooner if any problems are to arise. In the meantime, encouraged to call the office with any questions, concerns, changes symptoms.  Ovid Curd, DPM

## 2023-06-05 NOTE — Telephone Encounter (Signed)
Pharmacy reports it may have been a mistake. They do have the orders for the 90 day supply.

## 2023-06-06 NOTE — Telephone Encounter (Signed)
Can you check with the pharmacy--an Rx was sent in June for 90 days with 2 refills, which should be after another 3 months.  Why is there a request for a refill now?

## 2023-06-07 ENCOUNTER — Other Ambulatory Visit: Payer: Medicaid Other

## 2023-06-08 ENCOUNTER — Other Ambulatory Visit (INDEPENDENT_AMBULATORY_CARE_PROVIDER_SITE_OTHER): Payer: Medicaid Other

## 2023-06-08 DIAGNOSIS — E108 Type 1 diabetes mellitus with unspecified complications: Secondary | ICD-10-CM | POA: Diagnosis not present

## 2023-06-08 DIAGNOSIS — Z79899 Other long term (current) drug therapy: Secondary | ICD-10-CM | POA: Diagnosis not present

## 2023-06-08 DIAGNOSIS — Z0000 Encounter for general adult medical examination without abnormal findings: Secondary | ICD-10-CM | POA: Diagnosis not present

## 2023-06-08 DIAGNOSIS — E785 Hyperlipidemia, unspecified: Secondary | ICD-10-CM | POA: Diagnosis not present

## 2023-06-08 DIAGNOSIS — E559 Vitamin D deficiency, unspecified: Secondary | ICD-10-CM

## 2023-06-09 LAB — LIPID PANEL W/O CHOL/HDL RATIO
Cholesterol, Total: 161 mg/dL (ref 100–199)
HDL: 84 mg/dL (ref >39)
LDL Chol Calc (NIH): 59 mg/dL (ref 0–99)
Triglycerides: 101 mg/dL (ref 0–149)
VLDL Cholesterol Cal: 18 mg/dL (ref 5–40)

## 2023-06-09 LAB — CBC WITH DIFFERENTIAL/PLATELET
Basophils Absolute: 0 10*3/uL (ref 0.0–0.2)
Basos: 0 %
EOS (ABSOLUTE): 0.2 10*3/uL (ref 0.0–0.4)
Eos: 2 %
Hematocrit: 45.9 % (ref 34.0–46.6)
Hemoglobin: 14.5 g/dL (ref 11.1–15.9)
Immature Grans (Abs): 0 10*3/uL (ref 0.0–0.1)
Immature Granulocytes: 0 %
Lymphocytes Absolute: 2.7 10*3/uL (ref 0.7–3.1)
Lymphs: 29 %
MCH: 27.7 pg (ref 26.6–33.0)
MCHC: 31.6 g/dL (ref 31.5–35.7)
MCV: 88 fL (ref 79–97)
Monocytes Absolute: 0.7 10*3/uL (ref 0.1–0.9)
Monocytes: 7 %
Neutrophils Absolute: 5.7 10*3/uL (ref 1.4–7.0)
Neutrophils: 62 %
Platelets: 269 10*3/uL (ref 150–450)
RBC: 5.23 x10E6/uL (ref 3.77–5.28)
RDW: 15.9 % — ABNORMAL HIGH (ref 11.7–15.4)
WBC: 9.2 10*3/uL (ref 3.4–10.8)

## 2023-06-09 LAB — HEMOGLOBIN A1C
Est. average glucose Bld gHb Est-mCnc: 166 mg/dL
Hgb A1c MFr Bld: 7.4 % — ABNORMAL HIGH (ref 4.8–5.6)

## 2023-06-09 LAB — COMPREHENSIVE METABOLIC PANEL
ALT: 8 [IU]/L (ref 0–32)
AST: 17 [IU]/L (ref 0–40)
Albumin: 4.1 g/dL (ref 3.8–4.9)
Alkaline Phosphatase: 181 [IU]/L — ABNORMAL HIGH (ref 44–121)
BUN/Creatinine Ratio: 14 (ref 9–23)
BUN: 20 mg/dL (ref 6–24)
Bilirubin Total: 0.6 mg/dL (ref 0.0–1.2)
CO2: 22 mmol/L (ref 20–29)
Calcium: 9.7 mg/dL (ref 8.7–10.2)
Chloride: 105 mmol/L (ref 96–106)
Creatinine, Ser: 1.43 mg/dL — ABNORMAL HIGH (ref 0.57–1.00)
Globulin, Total: 3.5 g/dL (ref 1.5–4.5)
Glucose: 72 mg/dL (ref 70–99)
Potassium: 4.1 mmol/L (ref 3.5–5.2)
Sodium: 142 mmol/L (ref 134–144)
Total Protein: 7.6 g/dL (ref 6.0–8.5)
eGFR: 43 mL/min/{1.73_m2} — ABNORMAL LOW (ref >59)

## 2023-06-09 LAB — VITAMIN D 25 HYDROXY (VIT D DEFICIENCY, FRACTURES): Vit D, 25-Hydroxy: 19.1 ng/mL — ABNORMAL LOW (ref 30.0–100.0)

## 2023-06-09 LAB — TSH: TSH: 0.512 u[IU]/mL (ref 0.450–4.500)

## 2023-06-11 NOTE — Telephone Encounter (Signed)
Spoke with pharmacy, they report that they did not request refill. They requested a change for 90 day supply. Request was a mistake as they already have the 90 day Rx.

## 2023-06-12 ENCOUNTER — Ambulatory Visit (INDEPENDENT_AMBULATORY_CARE_PROVIDER_SITE_OTHER): Payer: Medicaid Other | Admitting: Internal Medicine

## 2023-06-12 DIAGNOSIS — Z23 Encounter for immunization: Secondary | ICD-10-CM

## 2023-06-18 ENCOUNTER — Other Ambulatory Visit: Payer: Self-pay

## 2023-06-18 ENCOUNTER — Encounter: Payer: Medicaid Other | Admitting: *Deleted

## 2023-06-18 DIAGNOSIS — Z006 Encounter for examination for normal comparison and control in clinical research program: Secondary | ICD-10-CM

## 2023-06-18 MED ORDER — STUDY - OCEAN(A) - OLPASIRAN (AMG 890) 142 MG/ML OR PLACEBO SQ INJECTION (PI-HILTY)
142.0000 mg | PREFILLED_SYRINGE | Freq: Once | SUBCUTANEOUS | Status: AC
  Administered 2023-06-18: 142 mg via SUBCUTANEOUS
  Filled 2023-06-18: qty 1

## 2023-06-18 NOTE — Research (Signed)
[name] [name]  Week 84  Vitals: [x]  Experience any AE/SAE/Hospitalizations []  Yes [x]  No  If yes please explain:  Labs collected:  no labs  Discussed with patient about the importance of not letting any one draw cholesterol or lipids. As to we are blinded to results. Reassured patient if we needed to be contacted the study team would reach out to our unblinded person.   ~reminder fasting labs, EKG, MD to see next visit ~  Non-Fatal Potential Endpoint Assessment Yes  No   Has the subject experienced/undergone any of the following since the last visit/contact?   []   [x]    Any Coronary Artery Revascularization/Cerebrovascular Revascularization/ Peripheral Artery Revascularization/Amputation Procedure   []   [x]    Myocardial Infarction []   [x]    Stroke   []   [x]    Provide the date for the non-fatal Potential Endpoints status:   []   [x]     IP admin please see MAR (please add Box # to comment section on MAR) [x]   Ms Offer is here for week 23 [name] [name]. She reports no abd pain, no visits to the Ed or Urgent care, she does report her Dr put her on calcium. Vs taken at 0929. Scheduled next appointment for Jan 29 at 0830. Injection given in left lower abd. At  [address]

## 2023-08-01 ENCOUNTER — Other Ambulatory Visit: Payer: Self-pay | Admitting: Podiatry

## 2023-08-19 ENCOUNTER — Emergency Department (HOSPITAL_COMMUNITY)
Admission: EM | Admit: 2023-08-19 | Discharge: 2023-08-19 | Disposition: A | Discharge status: Home / Self Care | Payer: Medicaid Other | Attending: Emergency Medicine | Admitting: Emergency Medicine

## 2023-08-19 ENCOUNTER — Other Ambulatory Visit: Payer: Self-pay

## 2023-08-19 ENCOUNTER — Encounter (HOSPITAL_COMMUNITY): Payer: Self-pay

## 2023-08-19 DIAGNOSIS — X58XXXA Exposure to other specified factors, initial encounter: Secondary | ICD-10-CM

## 2023-08-19 DIAGNOSIS — Z7721 Contact with and (suspected) exposure to potentially hazardous body fluids: Secondary | ICD-10-CM | POA: Insufficient documentation

## 2023-08-19 DIAGNOSIS — Z794 Long term (current) use of insulin: Secondary | ICD-10-CM | POA: Insufficient documentation

## 2023-08-19 DIAGNOSIS — Z7902 Long term (current) use of antithrombotics/antiplatelets: Secondary | ICD-10-CM | POA: Diagnosis not present

## 2023-08-19 LAB — RAPID HIV SCREEN (HIV 1/2 AB+AG)
HIV 1/2 Antibodies: NONREACTIVE
HIV-1 P24 Antigen - HIV24: NONREACTIVE

## 2023-08-19 LAB — HEPATITIS PANEL, ACUTE
HCV Ab: NONREACTIVE
Hep A IgM: NONREACTIVE
Hep B C IgM: NONREACTIVE
Hepatitis B Surface Ag: NONREACTIVE

## 2023-08-19 NOTE — ED Provider Notes (Signed)
[place] [facility] AT [facility] Provider Note   CSN: [id number] Arrival date & time: 08/19/23  1137     History  Chief Complaint  Patient presents with   Labs Only    [name] [name] is a 58 y.o. female history of diabetes presented to get exposure labs done.  Patient called EMS and was given glucagon and states this fix her sugars and she has no acute issues however the EMS provider excellently stuck himself with a needle after sticking her and she agreed to come here to get exposure labs.  Patient does not have history of HIV or hepatitis B and states that she is up-to-date on her vaccines and does not want to be prophylactically treated.  Home Medications Prior to Admission medications   Medication Sig Start Date End Date Taking? Authorizing Provider  acetaminophen (TYLENOL) 325 MG tablet Take 2 tablets (650 mg total) by mouth every 6 (six) hours as needed for mild pain or fever. 02/23/22   Hongalgi, Maximino Greenland, MD  albuterol (VENTOLIN HFA) 108 (90 Base) MCG/ACT inhaler TAKE 2 PUFFS BY MOUTH EVERY 6 HOURS AS NEEDED FOR WHEEZE OR SHORTNESS OF BREATH 09/15/22   Julieanne Manson, MD  ammonium lactate (AMLACTIN) 12 % cream APPLY TOPICALLY AS NEEDED FOR DRY SKIN 08/02/23   Vivi Barrack, DPM  Calcium-Magnesium-Vitamin D (CITRACAL CALCIUM+D) 600-40-500 MG-MG-UNIT TB24 1 tab by mouth daily 05/31/23   Julieanne Manson, MD  ciclopirox (PENLAC) 8 % solution APPLY TOPICALLY AT BEDTIME. APPLY OVER NAIL AND SURROUNDING SKIN. APPLY DAILY OVER PREVIOUS COAT. AFTER SEVEN (7) DAYS, MAY REMOVE WITH ALCOHOL AND CONTINUE CYCLE. 03/07/23   Vivi Barrack, DPM  clopidogrel (PLAVIX) 75 MG tablet TAKE 1 TABLET BY MOUTH EVERY DAY 03/07/23   Corky Crafts, MD  Continuous Blood Gluc Receiver (DEXCOM G6 RECEIVER) DEVI AS DIRECTED 364 DAYS 04/08/21   [provider]  Continuous Blood Gluc Sensor (DEXCOM G6 SENSOR) MISC SMARTSIG:Topical Once a Month 10/12/22   [provider]  Continuous Blood Gluc Transmit (DEXCOM G6 TRANSMITTER) MISC AS DIRECTED 90 DAYS 04/08/21   [provider]  CVS D3 25 MCG (1000 UT) capsule TAKE 1 CAPSULE BY MOUTH EVERY DAY 10/04/22   Julieanne Manson, MD  cyclobenzaprine (FLEXERIL) 10 MG tablet TAKE 1-2 TABLETS (10-20 MG TOTAL) BY MOUTH AT BEDTIME AS NEEDED FOR MUSCLE SPASMS. MUSCLE SPASMS 05/11/23   Julieanne Manson, MD  diphenhydrAMINE (BENADRYL) 25 MG tablet Take by mouth. 07/04/19   [provider]  ENBREL SURECLICK 50 MG/ML injection Inject into the skin. 09/13/22   [provider]  folic acid (FOLVITE) 1 MG tablet Take 1 mg by mouth daily.    [provider]  furosemide (LASIX) 40 MG tablet TAKE 1 TABLET BY MOUTH EVERY DAY IN THE MORNING 04/06/23   Julieanne Manson, MD  gabapentin (NEURONTIN) 100 MG capsule Take 1 capsule (100 mg total) by mouth at bedtime. 05/31/23   Julieanne Manson, MD  HUMALOG KWIKPEN 100 UNIT/ML KwikPen Inject 100 Units into the skin.  3 UNITS AT BREAKFAST, 3 units at LUNCH, 3 UNITS AT EVENING MEAL, PLUS 2 EXTRA UNITS IF OVER 300 MG/DL Subcutaneous (max daily dose of 24 units) Subcutaneous for 90 days 11/16/22   [provider]  hydrOXYzine (ATARAX) 25 MG tablet TAKE 1 TABLET BY MOUTH TWICE A DAY 06/18/23   Julieanne Manson, MD  Incontinence Supply Disposable (POISE ULTRA THINS) PADS Use 5 pads daily as needed for urinary stress incontinence 01/03/16  Julieanne Manson, MD  insulin aspart (NOVOLOG) 100 UNIT/ML injection Inject 3 Units into the skin 3 (three) times daily with meals. Per Sliding scale CBG 100-150: 4 units, 151-200: 6 units, 201-250: 8 units, 251-300: 10 units, 301-350: 12 units, 351-400: 14 units, > 401; call doctor    [provider]  LANTUS SOLOSTAR 100 UNIT/ML Solostar Pen Inject 12 Units into the skin 2 (two) times daily. 02/23/22   Hongalgi, Maximino Greenland, MD  metoprolol tartrate (LOPRESSOR) 25 MG tablet 1 1/2 tabs by mouth twice daily Patient  taking differently: 1 1/2 tabs by mouth twice daily has been taking 1 tab in morning and half tab at night. 04/27/23   Julieanne Manson, MD  nicotine polacrilex (NICORETTE) 4 MG gum Take 1 each (4 mg total) by mouth as needed for smoking cessation. 05/31/23   Julieanne Manson, MD  nitroGLYCERIN (NITROSTAT) 0.4 MG SL tablet PLACE 1 TABLET UNDER THE TONGUE EVERY 5 MINUTES AS NEEDED FOR CHEST PAIN. MAX 3 DOSES, CALL 911. 11/02/22   Corky Crafts, MD  nystatin (MYCOSTATIN/NYSTOP) powder APPLY TO AFFECTED AREA TWICE A DAY 01/24/23   Julieanne Manson, MD  ondansetron (ZOFRAN) 4 MG tablet 1 TAB BY MOUTH EVERY 6 HOURS AS NEEDED FOR NAUSEA AND VOMITING. 03/09/23   Julieanne Manson, MD  pantoprazole (PROTONIX) 40 MG tablet Take 40 mg by mouth 2 (two) times daily.    [provider]  Pramlintide Acetate 1500 MCG/1.5ML SOPN Inject 15 mcg into the skin 3 (three) times daily before meals.    [provider]  prednisoLONE acetate (PRED FORTE) 1 % ophthalmic suspension Place 4-5 drops into the left eye daily. 04/22/17   [provider]  rosuvastatin (CRESTOR) 40 MG tablet TAKE 1 TABLET BY MOUTH EVERY DAY 03/09/23   Julieanne Manson, MD  sertraline (ZOLOFT) 100 MG tablet 1 1/2 TABS BY MOUTH DAILY Patient taking differently: 1 tab by mouth daily 08/10/22   Julieanne Manson, MD  Study - [name](A) - olpasiran (AMG 890) 142 mg/mL or placebo SQ injection (PI-Hilty) Inject 142 mg into the skin once. For Investigational Use Only. Inject 1 mL (1 prefilled syringe) subcutaneously into appropriate injection site per protocol every 12 weeks in the clinic. (Approved injection site(s): upper arm, upper thigh & abdomen). Please contact [facility] with any questions or concerns regarding this medication.    Chrystie Nose, MD  traMADol (ULTRAM) 50 MG tablet TAKE 1 TABLET EVERY 6 HOURS AS NEEDED FOR MODERATE PAIN 03/06/17   Julieanne Manson, MD      Allergies    Aspirin    Review  of Systems   Review of Systems  Physical Exam Updated Vital Signs BP (!) 140/69   Pulse 80   Temp 98 F (36.7 C) (Oral)   Resp 14   Ht 5' (1.524 m)   Wt 90.7 kg   LMP 01/04/2015   SpO2 98%   BMI 39.06 kg/m  Physical Exam Constitutional:      General: She is not in acute distress. Cardiovascular:     Rate and Rhythm: Normal rate and regular rhythm.     Pulses: Normal pulses.     Heart sounds: Normal heart sounds.  Pulmonary:     Effort: Pulmonary effort is normal. No respiratory distress.     Breath sounds: Normal breath sounds.  Abdominal:     Palpations: Abdomen is soft.     Tenderness: There is no abdominal tenderness. There is no guarding or rebound.  Skin:  General: Skin is warm and dry.  Neurological:     Mental Status: She is alert.     ED Results / Procedures / Treatments   Labs (all labs ordered are listed, but only abnormal results are displayed) Labs Reviewed  RAPID HIV SCREEN (HIV 1/2 AB+AG)  HEPATITIS PANEL, ACUTE    EKG None  Radiology No results found.  Procedures Procedures    Medications Ordered in ED Medications - No data to display  ED Course/ Medical Decision Making/ A&P                                 Medical Decision Making Amount and/or Complexity of Data Reviewed Labs: ordered.   Nike Kingbird 58 y.o. presented today for exposure labs. Working DDx that I considered at this time includes, but not limited to, needle exposure, HIV, chest pain.  R/o DDx: Pending  Review of prior external notes: 08/17/2023 office visit  Unique Tests and My Interpretation:  Hepatitis panel: Pending Rapid HIV screen: Pending  Social Determinants of Health: none  Discussion with Independent Historian: None  Discussion of Management of Tests: None  Risk: Low: based on diagnostic testing/clinical impression and treatment plan  Risk Stratification Score: None  Plan: On exam patient was no acute distress stable vitals.  Patient's exam was  unremarkable.  Patient is here to have exposure labs drawn as she was the source.  Patient does not have history of hepatitis B or HIV and is up-to-date on her hepatitis B vaccines according to her.  I spoke to the patient about possibility of being prophylactic treatment HIV and patient declined at this time.  Patient wants to be discharged and follow-up on the labs outpatient which is reasonable and so we will discharge her follow-up on the labs on her MyChart app and have her go see her primary care provider.  I offered the patient further workup due to her hypoglycemia but the reason that she called EMS however patient declines at this time states that she would like to go home as she feels much better after given the glucagon from EMS.  Patient was given return precautions. Patient stable for discharge at this time.  Patient verbalized understanding of plan.  This chart was dictated using voice recognition software.  Despite best efforts to proofread,  errors can occur which can change the documentation meaning.         Final Clinical Impression(s) / ED Diagnoses Final diagnoses:  Exposure to needle, initial encounter    Rx / DC Orders ED Discharge Orders     None         Remi Deter 08/19/23 1218    Pricilla Loveless, MD 08/23/23 [phone]

## 2023-08-19 NOTE — Discharge Instructions (Signed)
Please follow-up on the MyChart app in regards to your results from your labs today.  If symptoms change or worsen please return to the ER.

## 2023-08-19 NOTE — ED Triage Notes (Signed)
Pt to ED via [facility] from home. Pt was given glucagon by EMS, when EMS staff was capping needle, EMS staff stuck their self with the needle. Pt here for source lab draw for the needle stick. Pt denies needing to be seen for anything else.

## 2023-08-20 ENCOUNTER — Other Ambulatory Visit: Payer: Self-pay | Admitting: Podiatry

## 2023-08-20 ENCOUNTER — Other Ambulatory Visit: Payer: Self-pay | Admitting: Internal Medicine

## 2023-08-27 NOTE — Telephone Encounter (Signed)
Please see if she actually requested these or if automatic requests.   What does she need the antifungal for?

## 2023-08-27 NOTE — Telephone Encounter (Signed)
Patient did not request Cyclobenzaprine and Sertraline.   Patient did need Nystatin. She reports that it helped treat fungal infection under her arms and breast before. She recently tried a new Deoderant which cause the infections to return.

## 2023-08-28 NOTE — Telephone Encounter (Signed)
Patient notified to call the office for refills on medications to treat an infection. Confirmed that patient has been taking sertraline 100mg  1 tab daily.

## 2023-08-28 NOTE — Telephone Encounter (Signed)
Please let her know in the future when she is asking for a medication to treat an infection, those are only written for the episode of infection.  If she is asking for a refill for something like the Nystatin or antibiotics, she needs to call the office first.

## 2023-08-29 ENCOUNTER — Encounter: Payer: Medicaid Other | Admitting: *Deleted

## 2023-08-29 DIAGNOSIS — Z006 Encounter for examination for normal comparison and control in clinical research program: Secondary | ICD-10-CM

## 2023-08-29 MED ORDER — STUDY - OCEAN(A) - OLPASIRAN (AMG 890) 142 MG/ML OR PLACEBO SQ INJECTION (PI-HILTY)
142.0000 mg | PREFILLED_SYRINGE | Freq: Once | SUBCUTANEOUS | Status: AC
  Administered 2023-08-29: 142 mg via SUBCUTANEOUS
  Filled 2023-08-29: qty 1

## 2023-08-29 NOTE — Research (Addendum)
[name]  Week 96  Vitals: [x]  Experience any AE/SAE/Hospitalizations []  Yes [x]  No  If yes please explain:  EKG: yes  MD to see: LR Labs collected:  0918   Discussed with patient about the importance of not letting any one draw cholesterol or lipids. As to we are blinded to results. Reassured patient if we needed to be contacted the study team would reach out to our unblinded person.   No labs at next visit 108.    Non-Fatal Potential Endpoint Assessment Yes  No   Has the subject experienced/undergone any of the following since the last visit/contact?   []   [x]    Any Coronary Artery Revascularization/Cerebrovascular Revascularization/ Peripheral Artery Revascularization/Amputation Procedure   []   [x]    Myocardial Infarction []   [x]    Stroke   []   [x]    Provide the date for the non-fatal Potential Endpoints status:   []   [x]     IP admin please see MAR (please add Box # to comment section on MAR) [x]   Patient was started on a new medication for her RA - leflunomide and continues to take enbrel - her methotrexate was d/c    Current Outpatient Medications:    acetaminophen (TYLENOL) 325 MG tablet, Take 2 tablets (650 mg total) by mouth every 6 (six) hours as needed for mild pain or fever., Disp: , Rfl:    albuterol (VENTOLIN HFA) 108 (90 Base) MCG/ACT inhaler, TAKE 2 PUFFS BY MOUTH EVERY 6 HOURS AS NEEDED FOR WHEEZE OR SHORTNESS OF BREATH, Disp: 18 each, Rfl: 1   ammonium lactate (AMLACTIN) 12 % cream, APPLY TOPICALLY AS NEEDED FOR DRY SKIN, Disp: 420 g, Rfl: 1   Calcium-Magnesium-Vitamin D (CITRACAL CALCIUM+D) 600-40-500 MG-MG-UNIT TB24, 1 tab by mouth daily, Disp: , Rfl:    clopidogrel (PLAVIX) 75 MG tablet, TAKE 1 TABLET BY MOUTH EVERY DAY, Disp: 90 tablet, Rfl: 2   Continuous Blood Gluc Receiver (DEXCOM G6 RECEIVER) DEVI, AS DIRECTED 364 DAYS, Disp: , Rfl:    Continuous Blood Gluc Sensor (DEXCOM G6 SENSOR) MISC, SMARTSIG:Topical Once a Month, Disp: , Rfl:    Continuous Blood  Gluc Transmit (DEXCOM G6 TRANSMITTER) MISC, AS DIRECTED 90 DAYS, Disp: , Rfl:    CVS D3 25 MCG (1000 UT) capsule, TAKE 1 CAPSULE BY MOUTH EVERY DAY, Disp: 90 capsule, Rfl: 0   cyclobenzaprine (FLEXERIL) 10 MG tablet, TAKE 1-2 TABLETS (10-20 MG TOTAL) BY MOUTH AT BEDTIME AS NEEDED FOR MUSCLE SPASMS. MUSCLE SPASMS, Disp: 30 tablet, Rfl: 0   diphenhydrAMINE (BENADRYL) 25 MG tablet, Take by mouth., Disp: , Rfl:    ENBREL SURECLICK 50 MG/ML injection, Inject 50 mg into the skin once a week., Disp: , Rfl:    folic acid (FOLVITE) 1 MG tablet, Take 1 mg by mouth daily., Disp: , Rfl:    furosemide (LASIX) 40 MG tablet, TAKE 1 TABLET BY MOUTH EVERY DAY IN THE MORNING, Disp: 90 tablet, Rfl: 3   gabapentin (NEURONTIN) 100 MG capsule, Take 1 capsule (100 mg total) by mouth at bedtime., Disp: 90 capsule, Rfl: 3   HUMALOG KWIKPEN 100 UNIT/ML KwikPen, Inject 100 Units into the skin.  3 UNITS AT BREAKFAST, 3 units at LUNCH, 3 UNITS AT EVENING MEAL, PLUS 2 EXTRA UNITS IF OVER 300 MG/DL Subcutaneous (max daily dose of 24 units) Subcutaneous for 90 days, Disp: , Rfl:    hydrOXYzine (ATARAX) 25 MG tablet, TAKE 1 TABLET BY MOUTH TWICE A DAY, Disp: 180 tablet, Rfl: 3   Incontinence Supply Disposable (  POISE ULTRA THINS) PADS, Use 5 pads daily as needed for urinary stress incontinence, Disp: 150 each, Rfl: 11   insulin aspart (NOVOLOG) 100 UNIT/ML injection, Inject 3 Units into the skin 3 (three) times daily with meals. Per Sliding scale CBG 100-150: 4 units, 151-200: 6 units, 201-250: 8 units, 251-300: 10 units, 301-350: 12 units, 351-400: 14 units, > 401; call doctor, Disp: , Rfl:    LANTUS SOLOSTAR 100 UNIT/ML Solostar Pen, Inject 12 Units into the skin 2 (two) times daily., Disp: , Rfl:    leflunomide (ARAVA) 10 MG tablet, Take 10 mg by mouth daily., Disp: , Rfl:    metoprolol tartrate (LOPRESSOR) 25 MG tablet, 1 1/2 tabs by mouth twice daily (Patient taking differently: 1 1/2 tabs by mouth twice daily has been taking 1  tab in morning and half tab at night.), Disp: 180 tablet, Rfl: 2   nicotine polacrilex (NICORETTE) 4 MG gum, Take 1 each (4 mg total) by mouth as needed for smoking cessation., Disp: 100 tablet, Rfl: 6   nitroGLYCERIN (NITROSTAT) 0.4 MG SL tablet, PLACE 1 TABLET UNDER THE TONGUE EVERY 5 MINUTES AS NEEDED FOR CHEST PAIN. MAX 3 DOSES, CALL 911., Disp: 26 tablet, Rfl: 6   nystatin (MYCOSTATIN/NYSTOP) powder, APPLY TO AFFECTED AREA TWICE A DAY, Disp: 30 g, Rfl: 0   ondansetron (ZOFRAN) 4 MG tablet, 1 TAB BY MOUTH EVERY 6 HOURS AS NEEDED FOR NAUSEA AND VOMITING., Disp: 12 tablet, Rfl: 0   pantoprazole (PROTONIX) 40 MG tablet, Take 40 mg by mouth 2 (two) times daily., Disp: , Rfl:    Pramlintide Acetate 1500 MCG/1.5ML SOPN, Inject 15 mcg into the skin 3 (three) times daily before meals., Disp: , Rfl:    prednisoLONE acetate (PRED FORTE) 1 % ophthalmic suspension, Place 4-5 drops into the left eye daily., Disp: , Rfl: 6   rosuvastatin (CRESTOR) 40 MG tablet, TAKE 1 TABLET BY MOUTH EVERY DAY, Disp: 90 tablet, Rfl: 3   sertraline (ZOLOFT) 100 MG tablet, Take 1 tablet (100 mg total) by mouth daily. 1 tab by mouth daily, Disp: 90 tablet, Rfl: 3   Study - [name](A) - olpasiran (AMG 890) 142 mg/mL or placebo SQ injection (PI-Hilty), Inject 142 mg into the skin once. For Investigational Use Only. Inject 1 mL (1 prefilled syringe) subcutaneously into appropriate injection site per protocol every 12 weeks in the clinic. (Approved injection site(s): upper arm, upper thigh & abdomen). Please contact [facility] with any questions or concerns regarding this medication., Disp: , Rfl:    traMADol (ULTRAM) 50 MG tablet, TAKE 1 TABLET EVERY 6 HOURS AS NEEDED FOR MODERATE PAIN, Disp: 90 tablet, Rfl: 2   ciclopirox (PENLAC) 8 % solution, APPLY TOPICALLY AT BEDTIME. APPLY OVER NAIL AND SURROUNDING SKIN. APPLY DAILY OVER PREVIOUS COAT. AFTER SEVEN (7) DAYS, MAY REMOVE WITH ALCOHOL AND CONTINUE CYCLE. (Patient not taking:  Reported on 08/29/2023), Disp: 6.6 mL, Rfl: 0  Current Facility-Administered Medications:    Study - [name](A) - olpasiran (AMG 890) 142 mg/mL or placebo SQ injection (PI-Hilty), 142 mg, Subcutaneous, Once,

## 2023-08-29 NOTE — Progress Notes (Signed)
Patient seen as a part of ocean a research study. Overall, reports doing well. No concerns. Has had no hospital/ER visits. Was recently switched from methotrexate to leflunomide, otherwise no changes. Does have chest discomfort occasionally, but feels this is more like her acid reflux symptoms.  Research PE exam   Physical Exam: General appearance: alert Lungs: clear to auscultation bilaterally Heart: regular rate and rhythm, S1, S2 normal, no murmur,  Abdomen: soft, non-tender Extremities: extremities normal, atraumatic, no cyanosis or edema Neurologic: Alert and oriented X 3, normal strength and tone. Normal symmetric reflexes. Normal coordination and gait  EKG: NSR, nonspecific changes in inferior leads (similar to prior tracings)  [place] Class:  [x] 1 [] 2[] 3 [] 4  NYHA:  N/a  [] 1 [] 2[] 3 [] 4  The patient was given the opportunity to ask any further questions about the study that were not addressed by the research nurse coordinators - he has no further questions and wants to proceed at this time.

## 2023-08-30 NOTE — Research (Addendum)
Are there any labs that are clinically significant?  Yes []  OR No[x]   Please FORWARD back to me with any changes or follow up!   Bicarb is low, but of unknown significance. Do not suspect related to study drug  Chrystie Nose, MD, [facility], FACP  [place]  [facility]  Medical Director of the [facility]  Cardiovascular Risk Reduction Clinic Diplomate of the American Board of Clinical Lipidology Attending Cardiologist  Direct Dial: [phone]  Fax: [phone]  Website:  [email or web address]    INVESTIGATOR: ([id number])                          PROTOCOL   [id number]                     Zoila Shutter M.D.                             INVESTIGATOR NO.: [id number]                     c/o Claretta Fraise                              SUBJECT ID: [id number]                     [facility]                 SUBJECT INITIALS NOT COLLECTED:                     [address] [place] [id number]                 VISIT: [place]                     [place] [id number]                   SPONSOR REPORT TO:                 COLLECTION TIME:09:18 DATE:29-Aug-2023                     Mertha Baars                      DATE RECEIVED IN LABORATORY: 30-Aug-2023                     c/o Sponsor(or Addtl) [facility].Study DATE REPORTED BY LABORATORY: 30-Aug-2023                     [facility]                          SEX: F  BIRTHDATE:  29-Jan-1966    AGE: 4K                     [id number]                  [place]is Bamberg NO.: [address], [place] [address]  Ref. Ranges               Clinical    Comments                                                                          Significance                                                                            Yes*  No                    FASTING GLUCOSE                      Glucose        129     H    70-100  mg/dL                               IS SUBJECT FASTING?                      Fasting?       Yes   EGFR                      CKDEPI eGF     61           mL/min/1.56m2                                                            No Ref Rng                     SM [id number] ANTIBODY COLL D/T                      CDate PreD     29-Aug-2023                                               CTime PreD     09:18  CHEMISTRY PANEL              Test Name       Result              Reference Range          Flags              Total Bili      0.5                 0.2-1.2 mg/dL  D-BilGen2       Pending Result             Ind Bili        Pending Result             Alk Phos        138                 35-104 U/L               H    (03/25/2024 = 166)             ALT (SGPT)      Pending Result             AST (SGOT)      15                  8-40 U/L                                          Urea Nitr       18                  4-24 mg/dL                                       Creatinine      1.07                0.35-1.14 mg/dL                                   Calcium         9.3                 8.3-10.6 mg/dL                                    Total Prot      7.2                 6.1-8.4 g/dL                                      Alb BCG         4.0                 3.3-4.9 g/dL                                      CK              27                  26-192 U/L                                       Sodium          138  132-147 mEq/L                                    Potassium       3.6                 3.5-5.2 mEq/L                                    Bicarb          16.7                19.3-29.3 mEq/L          LT   (03/25/2024 = 21.6)                       Chloride        105                 94-112 mEq/L                                        Flag Key(s)             H               Reece             LT              Low - Transmission

## 2023-08-31 ENCOUNTER — Encounter: Payer: Self-pay | Admitting: Podiatry

## 2023-08-31 ENCOUNTER — Ambulatory Visit (INDEPENDENT_AMBULATORY_CARE_PROVIDER_SITE_OTHER): Payer: Medicaid Other | Admitting: Podiatry

## 2023-08-31 DIAGNOSIS — B351 Tinea unguium: Secondary | ICD-10-CM | POA: Diagnosis not present

## 2023-08-31 DIAGNOSIS — M79674 Pain in right toe(s): Secondary | ICD-10-CM | POA: Diagnosis not present

## 2023-08-31 DIAGNOSIS — M79675 Pain in left toe(s): Secondary | ICD-10-CM

## 2023-08-31 DIAGNOSIS — E1149 Type 2 diabetes mellitus with other diabetic neurological complication: Secondary | ICD-10-CM

## 2023-08-31 NOTE — Progress Notes (Signed)
Subjective: Chief Complaint  Patient presents with   [facility]    RM#13 [facility] patient states feet tingling some pain has improved a little since last visit.     58 year old female presents the above concerns.  No open lesions.  Toenails are thickened elongated she has difficulty trimming himself and causing pain.  She said she did try to trim herself but was not able to do them well. She is still on gabapentin.  She still gets about tingling into her toes, feet.  PCP: Julieanne Manson, MD-last seen May 31, 2023 Endocrinologist: Dr. Talmage Coin, MD- last seen April 24, 2023  Last A1c 7.4 on 06/08/2023  She is on plavix  Objective: AAO 3, NAD DP/PT pulses palpable, CRT less than 3 seconds Sensation decreased with Semmes Weinstein monofilament. Nails hypertrophic, dystrophic, elongated, brittle, discolored 10. There is tenderness overlying the nails 1-5 bilaterally. There is no surrounding erythema or drainage along the nail sites. Calluses much improved since using the AmLactin. No ulcerations present bilaterally. Hammertoes present No pain with calf compression, swelling, warmth, erythema.  Symptomatic onychomycosis, hyperkeratotic lesions  Plan: -Treatment options including alternatives, risks, complications were discussed -Nails sharply debrided 10 without complication/bleeding. -Discussed trying to increase gabapentin to 2 tablets at nighttime.  Discussed side effects. -Discussed daily foot inspection. If there are any changes, to call the office immediately.  -Follow-up in 3 months or sooner if any problems are to arise. In the meantime, encouraged to call the office with any questions, concerns, changes symptoms.  Ovid Curd, DPM

## 2023-08-31 NOTE — Research (Cosign Needed)
Are there any labs that are clinically significant?  Yes []  OR No[x]  Some labs you have already reviewed - there was some labs pending still. This is updated with all the resutls.   Please FORWARD back to me with any changes or follow up!   I would recommend repeating the chemistry panel in 1-2 weeks given the mild abnormalities in bicarb, alk phos and direct bili - this is probably not clinically significant (nor clearly related to study drug), but worth noting and at minimum notifying PCP.  Chrystie Nose, MD, [facility], FACP  [place]  [facility]  Medical Director of the [facility]id [facility] &  Cardiovascular Risk Reduction Clinic Diplomate of the American Board of Clinical Lipidology Attending Cardiologist  Direct Dial: [phone]  Fax: [phone]  Website:  [email or web address]   CHEMISTRY PANEL                      Total Bili     0.5          0.2-1.2 mg/dL                                D-BilGen2      0.22    H    0.00-0.20 mg/dL                              Ind Bili       0.3          0.0-1.2 mg/dL                                Alk Phos       138     H    35-104 U/L                                #1 ALT (SGPT)     6            4-43 U/L                                    AST (SGOT)     15           8-40 U/L                                    Urea Nitr      18           4-24 mg/dL                                   Creatinine     1.07         0.35-1.14 mg/dL                              Calcium        9.3          8.3-10.6 mg/dL  Total Prot     7.2          6.1-8.4 g/dL                                 Alb BCG        4.0          3.3-4.9 g/dL                                 CK             27           26-192 U/L                                   Sodium         138          132-147 mEq/L                                Potassium      3.6          3.5-5.2 mEq/L                                Bicarb         16.7    LT   19.3-29.3 mEq/L                                            Chloride       105          94-112 mEq/L                               ADJUSTED CALCIUM                      Adj Calc       9.3          8.3-10.6 mg/dL    HEMATOLOGY&DIFFERENTIAL PANEL                      HGB            13.5         11.6-16.4 g/dL                              HCT            41           34-48 %                                      RBC            4.5          4.1-5.6 x106/uL  MCH            30           26-34 pg                                    MCHC           33           31-38 g/dL                                   RDW            18.2    H    12.0-15.0 %  (03/26/2023 - 15.5)                                 RBC Morph      No Review Required                                       MCV            91           79-98 fL                                    WBC            8.99         3.80-10.70 x103/uL                           Neutrophil     6.19         1.96-7.23 x103/uL                           Lymphocyte     1.93         0.91-4.28 x103/uL                           Monocytes      0.65         0.12-0.92 x103/uL                           Eosinophil     0.18         0.00-0.57 x103/uL                           Basophils      0.04         0.00-0.20 x103/uL                           Neutrophil     68.9         40.5-75.0 %                                 Lymphocyte     21.5  15.4-48.5%                                   Monocytes      7.2          2.6-10.1 %                                   Eosinophil     2.0          0.0-6.8 %                                    Basophils      0.5          0.0-2.0 %                                    Platelets      388          140-400 x103/uL                            ANC                      ANC            6.19         1.96-7.23 x103/uL    COAGULATION GROUP                      APTT           Invalid/Fibrin Clots                                                                   21.9-29.4 sec                      PT             Invalid/Fibrin Clots                                                                  9.7-12.3 sec                      INR            Invalid/Fibrin Clots                                                                  Patient not taking  oral anticoagulant:                                                      0.8 - 1.2                                                                Patient taking                                                          oral anticoagulant:                                                      2.0 - 3.0                                  ALT & INR                      ALT & INR      To follow                                                                 ALT > 3 X ULN                      ALT>3XULN      Criteria not met                                        ALT > 5 X ULN                      ALT>5XULN      Criteria not met                                        ALT > 8 X ULN                      ALT>8XULN      Criteria not met                                        ALT & TBIL  ALT & TBIL     Criteria not met                                        AST > 3 X ULN                      AST>3XULN      Criteria not met                                        AST > 5 X ULN                      AST>5XULN      Criteria not met                                        AST > 8 X ULN                      AST>8XULN      Criteria not met                                        AST & TBIL                      AST & TBIL     Criteria not met                      SM [id number] LPA COLLECTION D/T                      Coll Date      29-Aug-2023                                               Coll Time      09:18    SM [id number] ANTIBODY COLL D/T                      CDate PreD     Awaiting specimen arrival                                 CTime PreD      Awaiting specimen arrival  EGFR                      CKDEPI eGF     60           mL/min/1.73m2                                                            No Ref Rng  ANION GAP  Anion Gap      20      H    7-18 mEq/L  (10/19/2022 - 19)      HBA1C                      Hgb A1c        7.1     H    <6.5%    (10/19/2022 - 6.5)

## 2023-09-18 ENCOUNTER — Other Ambulatory Visit: Payer: Self-pay

## 2023-09-18 MED ORDER — CYCLOBENZAPRINE HCL 10 MG PO TABS
10.0000 mg | ORAL_TABLET | Freq: Every evening | ORAL | 0 refills | Status: DC | PRN
Start: 2023-09-18 — End: 2024-01-31

## 2023-10-23 ENCOUNTER — Other Ambulatory Visit: Payer: Self-pay | Admitting: Internal Medicine

## 2023-10-23 DIAGNOSIS — Z1231 Encounter for screening mammogram for malignant neoplasm of breast: Secondary | ICD-10-CM

## 2023-10-28 ENCOUNTER — Other Ambulatory Visit: Payer: Self-pay | Admitting: Interventional Cardiology

## 2023-11-02 ENCOUNTER — Ambulatory Visit (INDEPENDENT_AMBULATORY_CARE_PROVIDER_SITE_OTHER): Payer: Medicaid Other | Admitting: Podiatry

## 2023-11-02 ENCOUNTER — Encounter: Payer: Self-pay | Admitting: Podiatry

## 2023-11-02 DIAGNOSIS — M79674 Pain in right toe(s): Secondary | ICD-10-CM

## 2023-11-02 DIAGNOSIS — M79675 Pain in left toe(s): Secondary | ICD-10-CM | POA: Diagnosis not present

## 2023-11-02 DIAGNOSIS — B351 Tinea unguium: Secondary | ICD-10-CM

## 2023-11-02 DIAGNOSIS — E1149 Type 2 diabetes mellitus with other diabetic neurological complication: Secondary | ICD-10-CM | POA: Diagnosis not present

## 2023-11-02 NOTE — Progress Notes (Signed)
 Subjective: Chief Complaint  Patient presents with   Diabetes    Patient is here for [facility]    58 year old female presents the above concerns.  No open lesions.  She states that the toenails are thickened elongated she has difficulty trimming himself and causing pain.     PCP: Julieanne Manson, MD-last seen May 31, 2023 Endocrinologist: Dr. Talmage Coin, MD- last seen 10/17/2023  Last A1c 7.4 on 06/08/2023  She is on plavix  Objective: AAO 3, NAD DP/PT pulses palpable, CRT less than 3 seconds Sensation decreased with Semmes Weinstein monofilament. Nails hypertrophic, dystrophic, elongated, brittle, discolored 10. There is tenderness overlying the nails 1-5 bilaterally. There is no surrounding erythema or drainage along the nail sites. Calluses much improved since using the AmLactin. No ulcerations present bilaterally. Hammertoes present No pain with calf compression, swelling, warmth, erythema.  Symptomatic onychomycosis, hyperkeratotic lesions  Plan: -Treatment options including alternatives, risks, complications were discussed -Nails sharply debrided 10 without complication/bleeding. -Continue gabapentin for neuropathy -Moisturizer for the calluses -Discussed daily foot inspection. If there are any changes, to call the office immediately.  -Follow-up in 3 months or sooner if any problems are to arise. In the meantime, encouraged to call the office with any questions, concerns, changes symptoms.  Ovid Curd, DPM

## 2023-11-12 ENCOUNTER — Other Ambulatory Visit (INDEPENDENT_AMBULATORY_CARE_PROVIDER_SITE_OTHER): Payer: Medicaid Other

## 2023-11-12 DIAGNOSIS — E559 Vitamin D deficiency, unspecified: Secondary | ICD-10-CM

## 2023-11-12 DIAGNOSIS — E108 Type 1 diabetes mellitus with unspecified complications: Secondary | ICD-10-CM

## 2023-11-12 DIAGNOSIS — Z79899 Other long term (current) drug therapy: Secondary | ICD-10-CM | POA: Diagnosis not present

## 2023-11-16 LAB — BASIC METABOLIC PANEL WITH GFR
BUN/Creatinine Ratio: 12 (ref 9–23)
BUN: 15 mg/dL (ref 6–24)
CO2: 17 mmol/L — ABNORMAL LOW (ref 20–29)
Calcium: 10.1 mg/dL (ref 8.7–10.2)
Chloride: 112 mmol/L — ABNORMAL HIGH (ref 96–106)
Creatinine, Ser: 1.21 mg/dL — ABNORMAL HIGH (ref 0.57–1.00)
Glucose: 82 mg/dL (ref 70–99)
Potassium: 6 mmol/L — ABNORMAL HIGH (ref 3.5–5.2)
Sodium: 152 mmol/L — ABNORMAL HIGH (ref 134–144)
eGFR: 52 mL/min/{1.73_m2} — ABNORMAL LOW (ref >59)

## 2023-11-16 LAB — HEMOGLOBIN A1C
Est. average glucose Bld gHb Est-mCnc: 154 mg/dL
Hgb A1c MFr Bld: 7 % — ABNORMAL HIGH (ref 4.8–5.6)

## 2023-11-16 LAB — VITAMIN D 25 HYDROXY (VIT D DEFICIENCY, FRACTURES)

## 2023-11-26 ENCOUNTER — Other Ambulatory Visit: Payer: Self-pay | Admitting: Internal Medicine

## 2023-11-30 ENCOUNTER — Other Ambulatory Visit

## 2023-11-30 DIAGNOSIS — E875 Hyperkalemia: Secondary | ICD-10-CM

## 2023-11-30 NOTE — Progress Notes (Signed)
 A user error has taken place: encounter opened in error, closed for administrative reasons.

## 2023-12-03 ENCOUNTER — Ambulatory Visit: Payer: Medicaid Other | Admitting: Internal Medicine

## 2023-12-12 ENCOUNTER — Encounter: Payer: Medicaid Other | Admitting: *Deleted

## 2023-12-12 DIAGNOSIS — Z006 Encounter for examination for normal comparison and control in clinical research program: Secondary | ICD-10-CM

## 2023-12-12 MED ORDER — STUDY - OCEAN(A) - OLPASIRAN (AMG 890) 142 MG/ML OR PLACEBO SQ INJECTION (PI-HILTY)
142.0000 mg | PREFILLED_SYRINGE | Freq: Once | SUBCUTANEOUS | Status: AC
Start: 2023-12-12 — End: 2023-12-12
  Administered 2023-12-12: 142 mg via SUBCUTANEOUS
  Filled 2023-12-12: qty 1

## 2023-12-12 NOTE — Research (Addendum)
 [name]  Week 108  Vitals: [x]  Experience any AE/SAE/Hospitalizations []  Yes [x]  No  If yes please explain:    Discussed with patient about the importance of not letting any one draw cholesterol or lipids. As to we are blinded to results. Reassured patient if we needed to be contacted the study team would reach out to our unblinded person.    Non-Fatal Potential Endpoint Assessment Yes  No   Has the subject experienced/undergone any of the following since the last visit/contact?   []   [x]    Any Coronary Artery Revascularization/Cerebrovascular Revascularization/ Peripheral Artery Revascularization/Amputation Procedure   []   [x]    Myocardial Infarction []   [x]    Stroke   []   [x]    Provide the date for the non-fatal Potential Endpoints status:   []   [x]     IP admin please see MAR (please add Box # to comment section on MAR) [x]   Ms Maslowski is here for  [facility] a Week 108. She reports no abd pain, no visits to the Ed or Urgent care, and reports no longer using penlac . Scheduled next appointment for 20-Feb-2024 at 0900.    Current Outpatient Medications:    acetaminophen  (TYLENOL ) 325 MG tablet, Take 2 tablets (650 mg total) by mouth every 6 (six) hours as needed for mild pain or fever., Disp: , Rfl:    albuterol  (VENTOLIN  HFA) 108 (90 Base) MCG/ACT inhaler, TAKE 2 PUFFS BY MOUTH EVERY 6 HOURS AS NEEDED FOR WHEEZE OR SHORTNESS OF BREATH, Disp: 18 each, Rfl: 1   ammonium lactate  (AMLACTIN) 12 % cream, APPLY TOPICALLY AS NEEDED FOR DRY SKIN, Disp: 420 g, Rfl: 1   Calcium -Magnesium -Vitamin D  (CITRACAL CALCIUM +D) 600-40-500 MG-MG-UNIT TB24, 1 tab by mouth daily, Disp: , Rfl:    clopidogrel  (PLAVIX ) 75 MG tablet, Take 1 tablet (75 mg total) by mouth daily. Please call [phone] to schedule an overdue appointment for future refills. Thank you. 1st attempt., Disp: 90 tablet, Rfl: 2   Continuous Blood Gluc Receiver (DEXCOM G6 RECEIVER) DEVI, AS DIRECTED 364 DAYS, Disp: , Rfl:    Continuous Blood  Gluc Sensor (DEXCOM G6 SENSOR) MISC, SMARTSIG:Topical Once a Month, Disp: , Rfl:    Continuous Blood Gluc Transmit (DEXCOM G6 TRANSMITTER) MISC, AS DIRECTED 90 DAYS, Disp: , Rfl:    CVS D3 25 MCG (1000 UT) capsule, TAKE 1 CAPSULE BY MOUTH EVERY DAY, Disp: 90 capsule, Rfl: 0   cyclobenzaprine  (FLEXERIL ) 10 MG tablet, Take 1-2 tablets (10-20 mg total) by mouth at bedtime as needed for muscle spasms. Muscle spasms, Disp: 30 tablet, Rfl: 0   diphenhydrAMINE  (BENADRYL ) 25 MG tablet, Take by mouth., Disp: , Rfl:    ENBREL SURECLICK 50 MG/ML injection, Inject 50 mg into the skin once a week., Disp: , Rfl:    folic acid  (FOLVITE ) 1 MG tablet, Take 1 mg by mouth daily., Disp: , Rfl:    furosemide  (LASIX ) 40 MG tablet, TAKE 1 TABLET BY MOUTH EVERY DAY IN THE MORNING, Disp: 90 tablet, Rfl: 3   gabapentin  (NEURONTIN ) 100 MG capsule, Take 1 capsule (100 mg total) by mouth at bedtime., Disp: 90 capsule, Rfl: 3   HUMALOG KWIKPEN 100 UNIT/ML KwikPen, Inject 100 Units into the skin.  3 UNITS AT BREAKFAST, 3 units at LUNCH, 3 UNITS AT EVENING MEAL, PLUS 2 EXTRA UNITS IF OVER 300 MG/DL Subcutaneous (max daily dose of 24 units) Subcutaneous for 90 days, Disp: , Rfl:    hydrOXYzine  (ATARAX ) 25 MG tablet, TAKE 1 TABLET BY MOUTH TWICE  A DAY, Disp: 180 tablet, Rfl: 3   Incontinence Supply Disposable (POISE ULTRA THINS) PADS, Use 5 pads daily as needed for urinary stress incontinence, Disp: 150 each, Rfl: 11   LANTUS  SOLOSTAR 100 UNIT/ML Solostar Pen, Inject 12 Units into the skin 2 (two) times daily., Disp: , Rfl:    leflunomide (ARAVA) 10 MG tablet, Take 10 mg by mouth daily., Disp: , Rfl:    metoprolol  tartrate (LOPRESSOR ) 25 MG tablet, 1&1/2 TABS BY MOUTH TWICE DAILY, Disp: 270 tablet, Rfl: 1   nicotine  polacrilex (NICORETTE ) 4 MG gum, Take 1 each (4 mg total) by mouth as needed for smoking cessation., Disp: 100 tablet, Rfl: 6   nitroGLYCERIN  (NITROSTAT ) 0.4 MG SL tablet, PLACE 1 TABLET UNDER THE TONGUE EVERY 5 MINUTES  AS NEEDED FOR CHEST PAIN. MAX 3 DOSES, CALL 911., Disp: 26 tablet, Rfl: 6   nystatin  (MYCOSTATIN /NYSTOP ) powder, APPLY TO AFFECTED AREA TWICE A DAY, Disp: 30 g, Rfl: 0   ondansetron  (ZOFRAN ) 4 MG tablet, 1 TAB BY MOUTH EVERY 6 HOURS AS NEEDED FOR NAUSEA AND VOMITING., Disp: 12 tablet, Rfl: 0   pantoprazole  (PROTONIX ) 40 MG tablet, Take 40 mg by mouth 2 (two) times daily., Disp: , Rfl:    Pramlintide Acetate 1500 MCG/1.5ML SOPN, Inject 15 mcg into the skin 3 (three) times daily before meals., Disp: , Rfl:    prednisoLONE  acetate (PRED FORTE ) 1 % ophthalmic suspension, Place 4-5 drops into the left eye daily., Disp: , Rfl: 6   rosuvastatin  (CRESTOR ) 40 MG tablet, TAKE 1 TABLET BY MOUTH EVERY DAY, Disp: 90 tablet, Rfl: 3   sertraline  (ZOLOFT ) 100 MG tablet, Take 1 tablet (100 mg total) by mouth daily. 1 tab by mouth daily, Disp: 90 tablet, Rfl: 3   Study - [name](A) - olpasiran (AMG 890) 142 mg/mL or placebo SQ injection (PI-Hilty), Inject 142 mg into the skin once. For Investigational Use Only. Inject 1 mL (1 prefilled syringe) subcutaneously into appropriate injection site per protocol every 12 weeks in the clinic. (Approved injection site(s): upper arm, upper thigh & abdomen). Please contact [facility] with any questions or concerns regarding this medication., Disp: , Rfl:    traMADol  (ULTRAM ) 50 MG tablet, TAKE 1 TABLET EVERY 6 HOURS AS NEEDED FOR MODERATE PAIN, Disp: 90 tablet, Rfl: 2   ciclopirox  (PENLAC ) 8 % solution, APPLY TOPICALLY AT BEDTIME. APPLY OVER NAIL AND SURROUNDING SKIN. APPLY DAILY OVER PREVIOUS COAT. AFTER SEVEN (7) DAYS, MAY REMOVE WITH ALCOHOL AND CONTINUE CYCLE. (Patient not taking: Reported on 12/12/2023), Disp: 6.6 mL, Rfl: 0   insulin  aspart (NOVOLOG ) 100 UNIT/ML injection, Inject 3 Units into the skin 3 (three) times daily with meals. Per Sliding scale CBG 100-150: 4 units, 151-200: 6 units, 201-250: 8 units, 251-300: 10 units, 301-350: 12 units, 351-400: 14 units, > 401;  call doctor (Patient not taking: Reported on 12/12/2023), Disp: , Rfl:     Informed Consent  Re-consent   Subject Name: [name] [name]  Subject met inclusion and exclusion criteria.  The informed consent form, study requirements and expectations were reviewed with the subject and questions and concerns were addressed prior to the signing of the consent form.  The subject verbalized understanding of the trial requirements.  The subject agreed to participate in the [place] a trial and signed the informed consent at 0925 on 12-Dec-2023.   A copy of the signed informed consent was given to the subject and a copy was placed in the subject's medical record.  Subject re-consented to  Version: 7 IRB approval  24-Oct-2023  Diedra Stank Ward

## 2023-12-13 ENCOUNTER — Telehealth: Payer: Self-pay

## 2023-12-13 NOTE — Telephone Encounter (Signed)
 Patient needs an after lab appointment   Patient is available any day at time.

## 2023-12-25 NOTE — Telephone Encounter (Signed)
 Patient called and left a voicemail, patient reports she needs an appointment because she fell.

## 2023-12-26 ENCOUNTER — Encounter: Payer: Self-pay | Admitting: Internal Medicine

## 2023-12-26 ENCOUNTER — Ambulatory Visit (INDEPENDENT_AMBULATORY_CARE_PROVIDER_SITE_OTHER): Admitting: Internal Medicine

## 2023-12-26 VITALS — BP 132/82 | HR 73 | Resp 16 | Ht 60.0 in | Wt 194.0 lb

## 2023-12-26 DIAGNOSIS — I25119 Atherosclerotic heart disease of native coronary artery with unspecified angina pectoris: Secondary | ICD-10-CM | POA: Diagnosis not present

## 2023-12-26 DIAGNOSIS — E118 Type 2 diabetes mellitus with unspecified complications: Secondary | ICD-10-CM | POA: Diagnosis not present

## 2023-12-26 DIAGNOSIS — F411 Generalized anxiety disorder: Secondary | ICD-10-CM

## 2023-12-26 DIAGNOSIS — F329 Major depressive disorder, single episode, unspecified: Secondary | ICD-10-CM

## 2023-12-26 DIAGNOSIS — F17200 Nicotine dependence, unspecified, uncomplicated: Secondary | ICD-10-CM

## 2023-12-26 DIAGNOSIS — F609 Personality disorder, unspecified: Secondary | ICD-10-CM

## 2023-12-26 DIAGNOSIS — Z716 Tobacco abuse counseling: Secondary | ICD-10-CM | POA: Diagnosis not present

## 2023-12-26 DIAGNOSIS — M059 Rheumatoid arthritis with rheumatoid factor, unspecified: Secondary | ICD-10-CM

## 2023-12-26 MED ORDER — NITROGLYCERIN 0.4 MG SL SUBL: SUBLINGUAL_TABLET | SUBLINGUAL | 6 refills | Status: AC

## 2023-12-26 MED ORDER — ONDANSETRON HCL 4 MG PO TABS: ORAL_TABLET | ORAL | 1 refills | Status: DC

## 2023-12-26 NOTE — Telephone Encounter (Signed)
 Patient has been scheduled

## 2023-12-26 NOTE — Progress Notes (Signed)
 Subjective:    Patient ID: [name] [name], female   DOB: Aug 05, 1965, 58 y.o.   MRN: [id number]   HPI   IDDM:  has lost 8 lbs since January with Symlin.  A1C reportedly also 7.0% with Dr. Kathyanne Parkers.  She is not driving much any longer after having an insulin  reaction.  She is carrying peppermints and PB crackers with her.  No further episodes of low blood glucose causing symptoms.   Had eye exam on 4/29 and right eye stable.   Is needing refill of Zofran  due to nausea with symlin.     2.  Hyperkalemia back in April, but normal with Dr. Lindle Rhea, rheum and Dr. Kathyanne Parkers, endo subsequently   3.  RA:  Now taking Enbrel and Arava.  Feels her RA symptoms are much better controlled.  CRP significiantly decreased in April after starting both end of Jan and Feb.    4.  Anxiety, Depression and personality disorder:  She sees a Veterinary surgeon, Ms. Marguerite Shiley with [facility], but cannot prescribe meds.  She speaks with her weekly.  She states she needs refill of hydroxyzine .    5.  Tobacco Use Disorder:  states she is working with the QUIT line and smoking 5 cigarettes daily (which is what she was at last time).  Has not tolerated other ways to quit smoking in past.    6.  CAD/hyperlipidemia:  no chest pain.  In research program receiving injection of olpasiran  vs placebo every 3 months with cardiology.  Apparently, I am not to draw lipid panel due to blinded study.  Needs NTG refilled.  Taking Rosuvastatin  as well. Continues on PLavix   7.  Fall about 1 month ago--trying to get to a dog across a coffee table and fell over the table.  Had bruising on right upper arm and anterior lower right thigh.  All bruising has resolved.    8.  HM:  needs pneumococcal 20, but we are out.  She has had this year's COVID    Current Meds  Medication Sig   acetaminophen  (TYLENOL ) 325 MG tablet Take 2 tablets (650 mg total) by mouth every 6 (six) hours as needed for mild pain or fever.   albuterol  (VENTOLIN  HFA) 108 (90  Base) MCG/ACT inhaler TAKE 2 PUFFS BY MOUTH EVERY 6 HOURS AS NEEDED FOR WHEEZE OR SHORTNESS OF BREATH   ammonium lactate  (AMLACTIN) 12 % cream APPLY TOPICALLY AS NEEDED FOR DRY SKIN   Calcium -Magnesium -Vitamin D  (CITRACAL CALCIUM +D) 600-40-500 MG-MG-UNIT TB24 1 tab by mouth daily   clopidogrel  (PLAVIX ) 75 MG tablet Take 1 tablet (75 mg total) by mouth daily. Please call [phone] to schedule an overdue appointment for future refills. Thank you. 1st attempt.   Continuous Blood Gluc Receiver (DEXCOM G6 RECEIVER) DEVI AS DIRECTED 364 DAYS   Continuous Blood Gluc Sensor (DEXCOM G6 SENSOR) MISC SMARTSIG:Topical Once a Month   Continuous Blood Gluc Transmit (DEXCOM G6 TRANSMITTER) MISC AS DIRECTED 90 DAYS   CVS D3 25 MCG (1000 UT) capsule TAKE 1 CAPSULE BY MOUTH EVERY DAY   cyclobenzaprine  (FLEXERIL ) 10 MG tablet Take 1-2 tablets (10-20 mg total) by mouth at bedtime as needed for muscle spasms. Muscle spasms   diphenhydrAMINE  (BENADRYL ) 25 MG tablet Take by mouth.   ENBREL SURECLICK 50 MG/ML injection Inject 50 mg into the skin once a week.   folic acid  (FOLVITE ) 1 MG tablet Take 1 mg by mouth daily.   furosemide  (LASIX ) 40 MG tablet TAKE 1 TABLET  BY MOUTH EVERY DAY IN THE MORNING   gabapentin  (NEURONTIN ) 100 MG capsule Take 1 capsule (100 mg total) by mouth at bedtime.   HUMALOG KWIKPEN 100 UNIT/ML KwikPen Inject 100 Units into the skin.  3 UNITS AT BREAKFAST, 3 units at LUNCH, 3 UNITS AT EVENING MEAL, PLUS 2 EXTRA UNITS IF OVER 300 MG/DL Subcutaneous (max daily dose of 24 units) Subcutaneous for 90 days   hydrOXYzine  (ATARAX ) 25 MG tablet TAKE 1 TABLET BY MOUTH TWICE A DAY   Incontinence Supply Disposable (POISE ULTRA THINS) PADS Use 5 pads daily as needed for urinary stress incontinence   LANTUS  SOLOSTAR 100 UNIT/ML Solostar Pen Inject 12 Units into the skin 2 (two) times daily.   leflunomide (ARAVA) 10 MG tablet Take 10 mg by mouth daily.   metoprolol  tartrate (LOPRESSOR ) 25 MG tablet 1&1/2 TABS  BY MOUTH TWICE DAILY   nicotine  polacrilex (NICORETTE ) 4 MG gum Take 1 each (4 mg total) by mouth as needed for smoking cessation.   nitroGLYCERIN  (NITROSTAT ) 0.4 MG SL tablet PLACE 1 TABLET UNDER THE TONGUE EVERY 5 MINUTES AS NEEDED FOR CHEST PAIN. MAX 3 DOSES, CALL 911.   nystatin  (MYCOSTATIN /NYSTOP ) powder APPLY TO AFFECTED AREA TWICE A DAY   ondansetron  (ZOFRAN ) 4 MG tablet 1 TAB BY MOUTH EVERY 6 HOURS AS NEEDED FOR NAUSEA AND VOMITING.   pantoprazole  (PROTONIX ) 40 MG tablet Take 40 mg by mouth 2 (two) times daily.   Pramlintide Acetate 1500 MCG/1.5ML SOPN Inject 15 mcg into the skin 3 (three) times daily before meals.   prednisoLONE  acetate (PRED FORTE ) 1 % ophthalmic suspension Place 4-5 drops into the left eye daily.   rosuvastatin  (CRESTOR ) 40 MG tablet TAKE 1 TABLET BY MOUTH EVERY DAY   sertraline  (ZOLOFT ) 100 MG tablet Take 1 tablet (100 mg total) by mouth daily. 1 tab by mouth daily   Study - [name](A) - olpasiran (AMG 890) 142 mg/mL or placebo SQ injection (PI-Hilty) Inject 142 mg into the skin once. For Investigational Use Only. Inject 1 mL (1 prefilled syringe) subcutaneously into appropriate injection site per protocol every 12 weeks in the clinic. (Approved injection site(s): upper arm, upper thigh & abdomen). Please contact [facility] with any questions or concerns regarding this medication.   traMADol  (ULTRAM ) 50 MG tablet TAKE 1 TABLET EVERY 6 HOURS AS NEEDED FOR MODERATE PAIN   Allergies  Allergen Reactions   Aspirin  Other (See Comments)    Stomach bleeds  Other Reaction(s): Other (See Comments)  Stomach bleeds, GI bleeding     Review of Systems    Objective:   BP 132/82 (BP Location: Left Arm, Patient Position: Sitting, Cuff Size: Normal)   Pulse 73   Resp 16   Ht 5' (1.524 m)   Wt 194 lb (88 kg)   LMP 01/04/2015   SpO2 97%   BMI 37.89 kg/m   Physical Exam Cardiovascular:     Rate and Rhythm: Normal rate and regular rhythm.     Pulses:           Dorsalis pedis pulses are 2+ on the right side and 2+ on the left side.       Posterior tibial pulses are 2+ on the right side and 2+ on the left side.     Heart sounds: S1 normal and S2 normal. No murmur heard.    No friction rub. No S3 or S4 sounds.  Pulmonary:     Effort: Pulmonary effort is normal.     Breath sounds:  Normal breath sounds and air entry.  Abdominal:     General: Bowel sounds are normal.     Palpations: Abdomen is soft. There is no hepatomegaly, splenomegaly or mass.     Tenderness: There is no abdominal tenderness.     Hernia: No hernia is present.  Musculoskeletal:     Cervical back: Normal range of motion and neck supple.     Right lower leg: No edema.     Left lower leg: No edema.  Feet:     Right foot:     Protective Sensation: 10 sites tested.  10 sites sensed.     Skin integrity: Dry skin (about toenails) present.     Toenail Condition: Right toenails are normal.     Left foot:     Protective Sensation: 10 sites tested.  10 sites sensed.     Skin integrity: Dry skin (about toenails) present.     Toenail Condition: Left toenails are normal.  Lymphadenopathy:     Head:     Right side of head: No submental or submandibular adenopathy.     Left side of head: No submental or submandibular adenopathy.     Cervical: No cervical adenopathy.  Skin:    General: Skin is warm.     Capillary Refill: Capillary refill takes less than 2 seconds.     Findings: No rash.  Neurological:     Mental Status: She is alert.      Assessment & Plan   IDDM:  at goal with control.  As per Dr. Kathyanne Parkers. Eye exam up to date. Foot exam fine. Zofran  for nausea and vomiting with Symlin. Not driving much now--no further episodes of low blood glucose with driving or otherwise and now prepared.    2.  Hyperlipidemia:  did not realize we were not to check FLP as in blinded study.  As per pharm with cardiology.  3.  Tobacco use disorder:  Basically unchanged with 5 digs daily.  CT of  chest for lung CA screen.    4.  RA:  improved with change in therapy.  As per Dr. Lindle Rhea  5.  CAD:  stable.  Refill ntg.    6.  Depression/anxiety/personality disorder:  as per counselor, Ms. Williams.  Continue current meds. She has refills of hydroxyzine  through November.  7.  Fall with soft tissue injury:  resolved.

## 2024-01-04 ENCOUNTER — Telehealth: Payer: Self-pay

## 2024-01-04 ENCOUNTER — Telehealth: Payer: Self-pay | Admitting: Internal Medicine

## 2024-01-04 NOTE — Telephone Encounter (Signed)
 [facility] called to states patient does not qualify for the CT CHEST LUNG CANCER SCREENING LOW DOSE WO CONTRAST but does qualify for CT Chest W/o Contrast   States patient has not been smoking long enough to qualify and does not smoke enough when they do calculations

## 2024-01-06 ENCOUNTER — Other Ambulatory Visit: Payer: Self-pay | Admitting: Internal Medicine

## 2024-01-07 ENCOUNTER — Telehealth: Payer: Self-pay

## 2024-01-07 ENCOUNTER — Inpatient Hospital Stay: Admission: RE | Admit: 2024-01-07 | Source: Ambulatory Visit

## 2024-01-07 NOTE — Telephone Encounter (Signed)
 Patient called and left a message for Doctor [facility], patient just wanted to notify doctor that she went to an appointment that she was referred to by doctor to get an X- ray, patient states appointment was cancel and she was informed that reason for cancellation was that patient does not smoke enough cigarettes to get x-ray done.

## 2024-01-08 NOTE — Telephone Encounter (Signed)
 [facility] left a voicemail on 01/07/24 asking for a revised order for a CT chest  with out contrast , if have questions to call back to [phone] option 1.

## 2024-01-10 NOTE — Telephone Encounter (Signed)
 Spoke with patient and she reports she smokes at least 10 cigs daily and is cutting down

## 2024-01-10 NOTE — Telephone Encounter (Signed)
 I sent a request to you to call the patient and find out when she started smoking--what age or year, how many cigarettes daily, and when she changed the number of cigarettes smoked. Again, please have her be honest as we need to get a screening study if she has smoked a certain number of cigarettes over a certain number of years.  So she may have started in 1988 and smoked 2ppd for 10 years and then decreased to 1 ppd for 10 and now smokes 1/2 ppd for last 5 years--that's what I want to know.

## 2024-01-11 ENCOUNTER — Other Ambulatory Visit: Payer: Medicaid Other

## 2024-01-14 NOTE — Telephone Encounter (Signed)
 Spoke with patient and confirms only  Started smoking at the age of 49 80 cigs daily when drinking When she went to rehab she did not smoke cigs due to another control substance in 2000 Started back 2001  still 10 cigs daily due to drinking   Patient reports her left middle finger looks as if it is broken and would like pcp to look at it Sunray I told patient we will put her on the wait list

## 2024-01-18 ENCOUNTER — Telehealth: Payer: Self-pay

## 2024-01-18 NOTE — Telephone Encounter (Signed)
 Reports the update report on smoking sessation is updated for patient to have testing

## 2024-01-24 ENCOUNTER — Other Ambulatory Visit (HOSPITAL_BASED_OUTPATIENT_CLINIC_OR_DEPARTMENT_OTHER)

## 2024-01-30 ENCOUNTER — Other Ambulatory Visit: Payer: Self-pay | Admitting: Internal Medicine

## 2024-01-30 DIAGNOSIS — I10 Essential (primary) hypertension: Secondary | ICD-10-CM

## 2024-01-31 ENCOUNTER — Encounter: Payer: Self-pay | Admitting: Internal Medicine

## 2024-01-31 NOTE — Telephone Encounter (Signed)
 Notify her smoking history is not Weyer enough to meet criteria for a yearly CT scan of lungs at this time. Would still recommend she stop smoking.

## 2024-01-31 NOTE — Telephone Encounter (Signed)
 Patient has been notified of message and also aware of recommendation to stop smoking, patient just respond okay.  Patient had also been on wait list for an appointment , patient has been scheduled for 02/04/24 at 3:00 PM.

## 2024-02-04 ENCOUNTER — Ambulatory Visit
Admission: RE | Admit: 2024-02-04 | Discharge: 2024-02-04 | Disposition: A | Discharge status: Home / Self Care | Source: Ambulatory Visit | Attending: Internal Medicine | Admitting: Internal Medicine

## 2024-02-04 ENCOUNTER — Ambulatory Visit (INDEPENDENT_AMBULATORY_CARE_PROVIDER_SITE_OTHER): Admitting: Internal Medicine

## 2024-02-04 ENCOUNTER — Telehealth: Payer: Self-pay | Admitting: Internal Medicine

## 2024-02-04 ENCOUNTER — Encounter: Payer: Self-pay | Admitting: Internal Medicine

## 2024-02-04 VITALS — BP 132/72 | HR 84 | Resp 16 | Ht 60.0 in | Wt 191.0 lb

## 2024-02-04 DIAGNOSIS — M20002 Unspecified deformity of left finger(s): Secondary | ICD-10-CM | POA: Diagnosis not present

## 2024-02-04 DIAGNOSIS — Z1231 Encounter for screening mammogram for malignant neoplasm of breast: Secondary | ICD-10-CM

## 2024-02-04 NOTE — Progress Notes (Signed)
 Subjective:    Patient ID: [name] [name], female   DOB: 02-28-1966, 58 y.o.   MRN: [id number]   HPI  Noted DIP joint of left middle finger to be deformed.  The distal phalanx is now deviated radially and has angle at the joint medially.  States no acute injury--has occurred gradually.  She has been using tape to try and keep it straight.  If presses joint into alignment, has a bit of discomfort.  She does not have a follow up with Rheumatology until August.  Feels her joint pain of RA is quiescent with erythema and swelling.    Current Meds  Medication Sig   acetaminophen  (TYLENOL ) 325 MG tablet Take 2 tablets (650 mg total) by mouth every 6 (six) hours as needed for mild pain or fever.   albuterol  (VENTOLIN  HFA) 108 (90 Base) MCG/ACT inhaler TAKE 2 PUFFS BY MOUTH EVERY 6 HOURS AS NEEDED FOR WHEEZE OR SHORTNESS OF BREATH (Patient taking differently: 2 puffs every 6 (six) hours as needed.)   ammonium lactate  (AMLACTIN) 12 % cream APPLY TOPICALLY AS NEEDED FOR DRY SKIN   clopidogrel  (PLAVIX ) 75 MG tablet Take 1 tablet (75 mg total) by mouth daily. Please call [phone] to schedule an overdue appointment for future refills. Thank you. 1st attempt.   Continuous Blood Gluc Sensor (DEXCOM G6 SENSOR) MISC SMARTSIG:Topical Once a Month   Continuous Blood Gluc Transmit (DEXCOM G6 TRANSMITTER) MISC AS DIRECTED 90 DAYS   [employer]S D3 25 MCG (1000 UT) capsule TAKE 1 CAPSULE BY MOUTH EVERY DAY   cyclobenzaprine  (FLEXERIL ) 10 MG tablet TAKE 1-2 TABLETS (10-20 MG TOTAL) BY MOUTH AT BEDTIME AS NEEDED FOR MUSCLE SPASMS. MUSCLE SPASMS   diphenhydrAMINE  (BENADRYL ) 25 MG tablet Take by mouth.   ENBREL SURECLICK 50 MG/ML injection Inject 50 mg into the skin once a week.   folic acid  (FOLVITE ) 1 MG tablet Take 1 mg by mouth daily.   furosemide  (LASIX ) 40 MG tablet TAKE 1 TABLET BY MOUTH EVERY DAY IN THE MORNING   gabapentin  (NEURONTIN ) 100 MG capsule Take 1 capsule (100 mg total) by mouth at bedtime.   HUMALOG KWIKPEN  100 UNIT/ML KwikPen Inject 100 Units into the skin.  3 UNITS AT BREAKFAST, 3 units at LUNCH, 3 UNITS AT EVENING MEAL, PLUS 2 EXTRA UNITS IF OVER 300 MG/DL Subcutaneous (max daily dose of 24 units) Subcutaneous for 90 days   hydrOXYzine  (ATARAX ) 25 MG tablet TAKE 1 TABLET BY MOUTH TWICE A DAY   Incontinence Supply Disposable (POISE ULTRA THINS) PADS Use 5 pads daily as needed for urinary stress incontinence   LANTUS  SOLOSTAR 100 UNIT/ML Solostar Pen Inject 12 Units into the skin 2 (two) times daily.   leflunomide (ARAVA) 10 MG tablet Take 10 mg by mouth daily.   metoprolol  tartrate (LOPRESSOR ) 25 MG tablet 1&1/2 TABS BY MOUTH TWICE DAILY (Patient taking differently: 1 TAB BY MOUTH TWICE DAILY)   nitroGLYCERIN  (NITROSTAT ) 0.4 MG SL tablet PLACE 1 TABLET UNDER THE TONGUE EVERY 5 MINUTES AS NEEDED FOR CHEST PAIN. MAX 3 DOSES, CALL 911.   ondansetron  (ZOFRAN ) 4 MG tablet 1 TAB BY MOUTH EVERY 6 HOURS AS NEEDED FOR NAUSEA AND VOMITING.   pantoprazole  (PROTONIX ) 40 MG tablet Take 40 mg by mouth 2 (two) times daily.   Pramlintide Acetate 1500 MCG/1.5ML SOPN Inject 15 mcg into the skin 3 (three) times daily before meals.   prednisoLONE  acetate (PRED FORTE ) 1 % ophthalmic suspension Place 4-5 drops into the left eye daily.  rosuvastatin  (CRESTOR ) 40 MG tablet TAKE 1 TABLET BY MOUTH EVERY DAY   sertraline  (ZOLOFT ) 100 MG tablet Take 1 tablet (100 mg total) by mouth daily. 1 tab by mouth daily   Study - [name](A) - olpasiran (AMG 890) 142 mg/mL or placebo SQ injection (PI-Hilty) Inject 142 mg into the skin once. For Investigational Use Only. Inject 1 mL (1 prefilled syringe) subcutaneously into appropriate injection site per protocol every 12 weeks in the clinic. (Approved injection site(s): upper arm, upper thigh & abdomen). Please contact [facility] with any questions or concerns regarding this medication.   traMADol  (ULTRAM ) 50 MG tablet TAKE 1 TABLET EVERY 6 HOURS AS NEEDED FOR MODERATE PAIN    [DISCONTINUED] Continuous Blood Gluc Receiver (DEXCOM G6 RECEIVER) DEVI AS DIRECTED 364 DAYS   Allergies  Allergen Reactions   Aspirin  Other (See Comments)    Stomach bleeds  Other Reaction(s): Other (See Comments)  Stomach bleeds, GI bleeding     Review of Systems    Objective:   BP 132/72 (BP Location: Left Arm, Patient Position: Sitting, Cuff Size: Normal)   Pulse 84   Resp 16   LMP 01/04/2015   Physical Exam DIP joint deformity with distal phalanx pointing radially and angle toward ulnar hand.  Minimal tenderness on palpation of ulnar aspect.  Ulnar DIP with compressibility  Assessment & Plan  Left middle finger DIP deviation.  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  Could be cystic lesion of bone as well.   Discussed not clear if this is something reversible.

## 2024-02-04 NOTE — Telephone Encounter (Signed)
 Patient came to office for an appointment, while doing check in , I made patient aware she had a no show fee due to her no show in Dec 03, 2023. Patient states she was not aware of that appointment and that she had a appointment slip with a different time , patient states that is not her fault and that she will not pay fee.  I made patient aware that I understand her but that is our policy.   Per Doctor Adella , patient always does attend to her appointments, we will disregard fee for just this one time.

## 2024-02-08 ENCOUNTER — Ambulatory Visit
Admission: RE | Admit: 2024-02-08 | Discharge: 2024-02-08 | Disposition: A | Discharge status: Home / Self Care | Source: Ambulatory Visit | Attending: Internal Medicine | Admitting: Internal Medicine

## 2024-02-08 DIAGNOSIS — M20002 Unspecified deformity of left finger(s): Secondary | ICD-10-CM

## 2024-02-10 ENCOUNTER — Ambulatory Visit: Payer: Self-pay | Admitting: Internal Medicine

## 2024-02-11 ENCOUNTER — Ambulatory Visit (INDEPENDENT_AMBULATORY_CARE_PROVIDER_SITE_OTHER): Admitting: Podiatry

## 2024-02-11 VITALS — Ht 60.0 in | Wt 191.0 lb

## 2024-02-11 DIAGNOSIS — B351 Tinea unguium: Secondary | ICD-10-CM

## 2024-02-11 DIAGNOSIS — M79675 Pain in left toe(s): Secondary | ICD-10-CM

## 2024-02-11 DIAGNOSIS — M79674 Pain in right toe(s): Secondary | ICD-10-CM | POA: Diagnosis not present

## 2024-02-11 DIAGNOSIS — E1149 Type 2 diabetes mellitus with other diabetic neurological complication: Secondary | ICD-10-CM | POA: Diagnosis not present

## 2024-02-13 NOTE — Progress Notes (Signed)
 Subjective: Chief Complaint  Patient presents with   Nail Problem    RM 12 Patient is here as a follow-up of dermatophytosis of the right and left hallux. Patient is currently using prescribed topical cream.    58 year old female presents the above concerns.  No open lesions.  She states that the toenails are thickened elongated she has difficulty trimming himself and causing pain.    PCP: Adella Norris, MD-last seen 02/04/24 Endocrinologist: Dr. Reyes Alexander, MD- last seen 01/10/24  Last A1c 7.0 on 11/12/2023  She is on plavix   Objective: AAO 3, NAD DP/PT pulses palpable, CRT less than 3 seconds Sensation decreased with Semmes Weinstein monofilament. Nails hypertrophic, dystrophic, elongated, brittle, discolored 10. There is tenderness overlying the nails 1-5 bilaterally. There is no surrounding erythema or drainage along the nail sites. No ulcerations present bilaterally. Hammertoes present No pain with calf compression, swelling, warmth, erythema.  Symptomatic onychomycosis  Plan: -Treatment options including alternatives, risks, complications were discussed -Nails sharply debrided 10 without complication/bleeding. -Continue gabapentin  for neuropathy -Discussed daily foot inspection. If there are any changes, to call the office immediately.  -Follow-up in 3 months or sooner if any problems are to arise. In the meantime, encouraged to call the office with any questions, concerns, changes symptoms.  Donnice Fees, DPM

## 2024-02-16 ENCOUNTER — Other Ambulatory Visit: Payer: Self-pay | Admitting: Internal Medicine

## 2024-02-20 ENCOUNTER — Other Ambulatory Visit: Payer: Self-pay

## 2024-02-20 ENCOUNTER — Encounter: Admitting: *Deleted

## 2024-02-20 ENCOUNTER — Emergency Department (HOSPITAL_COMMUNITY): Admission: EM | Admit: 2024-02-20 | Discharge: 2024-02-20 | Disposition: A | Discharge status: Home / Self Care

## 2024-02-20 ENCOUNTER — Emergency Department (HOSPITAL_COMMUNITY)

## 2024-02-20 DIAGNOSIS — I2510 Atherosclerotic heart disease of native coronary artery without angina pectoris: Secondary | ICD-10-CM | POA: Insufficient documentation

## 2024-02-20 DIAGNOSIS — E1122 Type 2 diabetes mellitus with diabetic chronic kidney disease: Secondary | ICD-10-CM | POA: Insufficient documentation

## 2024-02-20 DIAGNOSIS — I129 Hypertensive chronic kidney disease with stage 1 through stage 4 chronic kidney disease, or unspecified chronic kidney disease: Secondary | ICD-10-CM | POA: Insufficient documentation

## 2024-02-20 DIAGNOSIS — Z79899 Other long term (current) drug therapy: Secondary | ICD-10-CM | POA: Insufficient documentation

## 2024-02-20 DIAGNOSIS — N1831 Chronic kidney disease, stage 3a: Secondary | ICD-10-CM | POA: Diagnosis not present

## 2024-02-20 DIAGNOSIS — R079 Chest pain, unspecified: Secondary | ICD-10-CM | POA: Diagnosis present

## 2024-02-20 DIAGNOSIS — R0789 Other chest pain: Secondary | ICD-10-CM | POA: Diagnosis not present

## 2024-02-20 DIAGNOSIS — Z006 Encounter for examination for normal comparison and control in clinical research program: Secondary | ICD-10-CM

## 2024-02-20 LAB — CBC
HCT: 46.2 % — ABNORMAL HIGH (ref 36.0–46.0)
Hemoglobin: 15.1 g/dL — ABNORMAL HIGH (ref 12.0–15.0)
MCH: 28 pg (ref 26.0–34.0)
MCHC: 32.7 g/dL (ref 30.0–36.0)
MCV: 85.7 fL (ref 80.0–100.0)
Platelets: 194 K/uL (ref 150–400)
RBC: 5.39 MIL/uL — ABNORMAL HIGH (ref 3.87–5.11)
RDW: 17.2 % — ABNORMAL HIGH (ref 11.5–15.5)
WBC: 7.9 K/uL (ref 4.0–10.5)
nRBC: 0 % (ref 0.0–0.2)

## 2024-02-20 LAB — TROPONIN I (HIGH SENSITIVITY)
Troponin I (High Sensitivity): 9 ng/L (ref <18)
Troponin I (High Sensitivity): 9 ng/L (ref <18)

## 2024-02-20 LAB — BASIC METABOLIC PANEL WITH GFR
Anion gap: 13 (ref 5–15)
BUN: 24 mg/dL — ABNORMAL HIGH (ref 6–20)
CO2: 21 mmol/L — ABNORMAL LOW (ref 22–32)
Calcium: 9.1 mg/dL (ref 8.9–10.3)
Chloride: 105 mmol/L (ref 98–111)
Creatinine, Ser: 1.3 mg/dL — ABNORMAL HIGH (ref 0.44–1.00)
GFR, Estimated: 48 mL/min — ABNORMAL LOW (ref >60)
Glucose, Bld: 145 mg/dL — ABNORMAL HIGH (ref 70–99)
Potassium: 3.2 mmol/L — ABNORMAL LOW (ref 3.5–5.1)
Sodium: 139 mmol/L (ref 135–145)

## 2024-02-20 MED ORDER — STUDY - OCEAN(A) - OLPASIRAN (AMG 890) 142 MG/ML OR PLACEBO SQ INJECTION (PI-HILTY)
142.0000 mg | PREFILLED_SYRINGE | Freq: Once | SUBCUTANEOUS | Status: DC
Start: 2024-02-20 — End: 2024-02-21
  Filled 2024-02-20: qty 1

## 2024-02-20 MED ORDER — POTASSIUM CHLORIDE CRYS ER 20 MEQ PO TBCR
40.0000 meq | EXTENDED_RELEASE_TABLET | Freq: Once | ORAL | Status: AC
  Administered 2024-02-20: 40 meq via ORAL
  Filled 2024-02-20: qty 2

## 2024-02-20 NOTE — ED Provider Notes (Signed)
 [place] EMERGENCY DEPARTMENT AT [facility] Provider Note   CSN: [id number] Arrival date & time: 02/20/24  9041     Patient presents with: Chest Pain and right arm    [name] [name] is a 58 y.o. female.   58 year old female presents today for concern of chest pain.  Ongoing for the past month.  She also states she fell about a month ago and struck her head.  Was not evaluated.  Denies loss of consciousness.  She does have history of MI and is status post PCI.  The history is provided by the patient. No language interpreter was used.       Prior to Admission medications   Medication Sig Start Date End Date Taking? Authorizing Provider  acetaminophen  (TYLENOL ) 325 MG tablet Take 2 tablets (650 mg total) by mouth every 6 (six) hours as needed for mild pain or fever. 02/23/22   Hongalgi, Anand D, MD  albuterol  (VENTOLIN  HFA) 108 (90 Base) MCG/ACT inhaler TAKE 2 PUFFS BY MOUTH EVERY 6 HOURS AS NEEDED FOR WHEEZE OR SHORTNESS OF BREATH 09/15/22   Adella Norris, MD  ammonium lactate  (AMLACTIN) 12 % cream APPLY TOPICALLY AS NEEDED FOR DRY SKIN 08/02/23   Gershon Donnice SAUNDERS, DPM  Calcium -Magnesium -Vitamin D  (CITRACAL CALCIUM +D) 600-40-500 MG-MG-UNIT TB24 1 tab by mouth daily 05/31/23   Adella Norris, MD  clopidogrel  (PLAVIX ) 75 MG tablet Take 1 tablet (75 mg total) by mouth daily. Please call [phone] to schedule an overdue appointment for future refills. Thank you. 1st attempt. 10/29/23   Waddell Danelle ORN, MD  Continuous Blood Gluc Sensor (DEXCOM G6 SENSOR) MISC SMARTSIG:Topical Once a Month 10/12/22   [provider]  Continuous Blood Gluc Transmit (DEXCOM G6 TRANSMITTER) MISC AS DIRECTED 90 DAYS 04/08/21   [provider]  CVS D3 25 MCG (1000 UT) capsule TAKE 1 CAPSULE BY MOUTH EVERY DAY 10/04/22   Adella Norris, MD  cyclobenzaprine  (FLEXERIL ) 10 MG tablet TAKE 1-2 TABLETS (10-20 MG TOTAL) BY MOUTH AT BEDTIME AS NEEDED FOR MUSCLE SPASMS. MUSCLE SPASMS  01/31/24   Adella Norris, MD  diphenhydrAMINE  (BENADRYL ) 25 MG tablet Take by mouth. 07/04/19   [provider]  ENBREL SURECLICK 50 MG/ML injection Inject 50 mg into the skin once a week. 09/13/22   [provider]  folic acid  (FOLVITE ) 1 MG tablet Take 1 mg by mouth daily.    [provider]  furosemide  (LASIX ) 40 MG tablet TAKE 1 TABLET BY MOUTH EVERY DAY IN THE MORNING 01/31/24   Adella Norris, MD  gabapentin  (NEURONTIN ) 100 MG capsule Take 1 capsule (100 mg total) by mouth at bedtime. 05/31/23   Adella Norris, MD  HUMALOG KWIKPEN 100 UNIT/ML KwikPen Inject 100 Units into the skin.  3 UNITS AT BREAKFAST, 3 units at LUNCH, 3 UNITS AT EVENING MEAL, PLUS 2 EXTRA UNITS IF OVER 300 MG/DL Subcutaneous (max daily dose of 24 units) Subcutaneous for 90 days 11/16/22   [provider]  hydrOXYzine  (ATARAX ) 25 MG tablet TAKE 1 TABLET BY MOUTH TWICE A DAY 02/18/24   Adella Norris, MD  Incontinence Supply Disposable (POISE ULTRA THINS) PADS Use 5 pads daily as needed for urinary stress incontinence 01/03/16   Adella Norris, MD  LANTUS  SOLOSTAR 100 UNIT/ML Solostar Pen Inject 12 Units into the skin 2 (two) times daily. 02/23/22   Hongalgi, Anand D, MD  leflunomide (ARAVA) 10 MG tablet Take 10 mg by mouth daily.    [provider]  metoprolol  tartrate (LOPRESSOR ) 25 MG  tablet 1&1/2 TABS BY MOUTH TWICE DAILY 11/26/23   Adella Norris, MD  nicotine  polacrilex (NICORETTE ) 4 MG gum Take 1 each (4 mg total) by mouth as needed for smoking cessation. 05/31/23   Adella Norris, MD  nitroGLYCERIN  (NITROSTAT ) 0.4 MG SL tablet PLACE 1 TABLET UNDER THE TONGUE EVERY 5 MINUTES AS NEEDED FOR CHEST PAIN. MAX 3 DOSES, CALL 911. 12/26/23   Adella Norris, MD  ondansetron  (ZOFRAN ) 4 MG tablet 1 TAB BY MOUTH EVERY 6 HOURS AS NEEDED FOR NAUSEA AND VOMITING. 12/26/23   Adella Norris, MD  pantoprazole  (PROTONIX ) 40 MG tablet Take 40 mg by mouth 2 (two)  times daily.    [provider]  Pramlintide Acetate 1500 MCG/1.5ML SOPN Inject 15 mcg into the skin 3 (three) times daily before meals.    [provider]  prednisoLONE  acetate (PRED FORTE ) 1 % ophthalmic suspension Place 4-5 drops into the left eye daily. 04/22/17   [provider]  rosuvastatin  (CRESTOR ) 40 MG tablet TAKE 1 TABLET BY MOUTH EVERY DAY 01/31/24   Adella Norris, MD  sertraline  (ZOLOFT ) 100 MG tablet Take 1 tablet (100 mg total) by mouth daily. 1 tab by mouth daily 08/28/23   Adella Norris, MD  Study - [name](A) - olpasiran (AMG 890) 142 mg/mL or placebo SQ injection (PI-Hilty) Inject 142 mg into the skin once. For Investigational Use Only. Inject 1 mL (1 prefilled syringe) subcutaneously into appropriate injection site per protocol every 12 weeks in the clinic. (Approved injection site(s): upper arm, upper thigh & abdomen). Please contact [facility] with any questions or concerns regarding this medication.    Mona Vinie BROCKS, MD  traMADol  (ULTRAM ) 50 MG tablet TAKE 1 TABLET EVERY 6 HOURS AS NEEDED FOR MODERATE PAIN 03/06/17   Adella Norris, MD    Allergies: Aspirin     Review of Systems  Constitutional:  Negative for chills and fever.  Respiratory:  Negative for shortness of breath.   Cardiovascular:  Positive for chest pain. Negative for palpitations and leg swelling.  Gastrointestinal:  Negative for abdominal pain.  Neurological:  Negative for light-headedness.  All other systems reviewed and are negative.   Updated Vital Signs BP 116/60 (BP Location: Right Arm)   Pulse 80   Temp 97.7 F (36.5 C)   Resp 18   LMP 01/04/2015   SpO2 90% Comment: denies SHOB, dizziness  Physical Exam Vitals and nursing note reviewed.  Constitutional:      General: She is not in acute distress.    Appearance: Normal appearance. She is not ill-appearing.  HENT:     Head: Normocephalic and atraumatic.     Nose: Nose normal.  Eyes:      Conjunctiva/sclera: Conjunctivae normal.  Cardiovascular:     Rate and Rhythm: Normal rate and regular rhythm.  Pulmonary:     Effort: Pulmonary effort is normal. No respiratory distress.     Breath sounds: Normal breath sounds. No wheezing.  Musculoskeletal:        General: No deformity.     Right lower leg: No edema.     Left lower leg: No edema.  Skin:    Findings: No rash.  Neurological:     Mental Status: She is alert.     (all labs ordered are listed, but only abnormal results are displayed) Labs Reviewed  BASIC METABOLIC PANEL WITH GFR - Abnormal; Notable for the following components:      Result Value   Potassium 3.2 (*)    CO2 21 (*)  Glucose, Bld 145 (*)    BUN 24 (*)    Creatinine, Ser 1.30 (*)    GFR, Estimated 48 (*)    All other components within normal limits  CBC - Abnormal; Notable for the following components:   RBC 5.39 (*)    Hemoglobin 15.1 (*)    HCT 46.2 (*)    RDW 17.2 (*)    All other components within normal limits  TROPONIN I (Aho SENSITIVITY)  TROPONIN I (Dettman SENSITIVITY)    EKG: None  Radiology: DG Chest 2 View Result Date: 02/20/2024 CLINICAL DATA:  Chest pain. EXAM: CHEST - 2 VIEW COMPARISON:  02/19/2022. FINDINGS: The heart size and mediastinal contours are unchanged. No focal consolidation, pleural effusion, or pneumothorax. No acute osseous abnormality. IMPRESSION: No acute cardiopulmonary findings. Electronically Signed   By: Harrietta Sherry M.D.   On: 02/20/2024 10:52   CT Head Wo Contrast Result Date: 02/20/2024 CLINICAL DATA:  Fall.  Head strike. EXAM: CT HEAD WITHOUT CONTRAST TECHNIQUE: Contiguous axial images were obtained from the base of the skull through the vertex without intravenous contrast. RADIATION DOSE REDUCTION: This exam was performed according to the departmental dose-optimization program which includes automated exposure control, adjustment of the mA and/or kV according to patient size and/or use of iterative  reconstruction technique. COMPARISON:  Head CT 01/04/2018 FINDINGS: Brain: There is no evidence of an acute infarct, intracranial hemorrhage, mass, midline shift, or extra-axial fluid collection. Patchy and confluent hypodensities in the cerebral white matter bilaterally have likely mildly progressed and are nonspecific but compatible with moderate chronic small vessel ischemic disease. Cerebral volume is within normal limits for age. The ventricles are normal in size. Vascular: Calcified atherosclerosis at the skull base. No hyperdense vessel. Skull: No acute fracture or suspicious lesion. Sinuses/Orbits: Visualized paranasal sinuses and mastoid air cells are clear. Left phthisis bulbi. Other: None. IMPRESSION: 1. No evidence of acute intracranial abnormality. 2. Moderate chronic small vessel ischemic disease. Electronically Signed   By: Dasie Hamburg M.D.   On: 02/20/2024 10:43     Procedures   Medications Ordered in the ED - No data to display                                  Medical Decision Making Amount and/or Complexity of Data Reviewed Labs: ordered. Radiology: ordered.   Medical Decision Making / ED Course   This patient presents to the ED for concern of chest pain, this involves an extensive number of treatment options, and is a complaint that carries with it a Gradillas risk of complications and morbidity.  The differential diagnosis includes ACS, PE, pneumonia, MSK pain, GERD, dissection Low suspicion for dissection given history and exam.  Low suspicion for PE given no tachycardia, tachypnea, or hypoxia.  And given duration of symptoms.  MDM: 58 year old female presents today with chest pain.  Presents from cardiac research.  Symptoms ongoing for a month.  Also endorses head strike with a fall about a month ago.  No loss of consciousness.  Not on anticoagulants. CT head without acute intracranial process. Pain is reproducible on exam. CBC without acute concern.  BMP with mild renal  insufficiency otherwise without acute concern.  Potassium 3.2.  P.o. repletion given.  Troponin negative.  EKG without acute ischemic change.  Chest x-ray without acute cardiopulmonary process.  Likely MSK in etiology. Discussed follow-up with cardiology given her history. Discussed follow-up with PCP. Patient  discharged in stable condition.    Lab Tests: -I ordered, reviewed, and interpreted labs.   The pertinent results include:   Labs Reviewed  BASIC METABOLIC PANEL WITH GFR - Abnormal; Notable for the following components:      Result Value   Potassium 3.2 (*)    CO2 21 (*)    Glucose, Bld 145 (*)    BUN 24 (*)    Creatinine, Ser 1.30 (*)    GFR, Estimated 48 (*)    All other components within normal limits  CBC - Abnormal; Notable for the following components:   RBC 5.39 (*)    Hemoglobin 15.1 (*)    HCT 46.2 (*)    RDW 17.2 (*)    All other components within normal limits  TROPONIN I (Defrancesco SENSITIVITY)  TROPONIN I (Umali SENSITIVITY)      EKG  EKG Interpretation Date/Time:    Ventricular Rate:    PR Interval:    QRS Duration:    QT Interval:    QTC Calculation:   R Axis:      Text Interpretation:           Imaging Studies ordered: I ordered imaging studies including chest x-ray I independently visualized and interpreted imaging. I agree with the radiologist interpretation   Medicines ordered and prescription drug management: No orders of the defined types were placed in this encounter.   -I have reviewed the patients home medicines and have made adjustments as needed    Reevaluation: After the interventions noted above, I reevaluated the patient and found that they have :improved  Co morbidities that complicate the patient evaluation  Past Medical History:  Diagnosis Date   Acute myocardial infarction of other lateral wall, initial episode of care    Acute myocardial infarction, unspecified site, initial episode of care    Acute  osteomyelitis    Acute osteomyelitis of left foot (HCC) 02/15/2018   Acute renal failure superimposed on stage 3a chronic kidney disease (HCC)    Allergic rhinitis    Anemia    Anginal pain (HCC)    07/15/13- no chest pain in months   Anxiety    Bipolar affective (HCC)    CAD (coronary artery disease) 07/2011   s/p DES mid and distal RCA with 50% LAD   CKD stage 3 due to type 1 diabetes mellitus (HCC) 02/21/2015   Closed fracture of distal end of left radius 04/22/2020   Diabetic foot ulcer associated with diabetes mellitus due to underlying condition (HCC) 09/26/2018   Diabetic gastroparesis associated with type 1 diabetes mellitus (HCC)    Diabetic peripheral neuropathy associated with type 1 diabetes mellitus (HCC)    Diabetic retinopathy    Esophageal stenosis    Esophageal ulcer    Esophagitis    Gastroparesis    Genital herpes    Reportedly tested and documented by [facility]--rare occurrences   GERD (gastroesophageal reflux disease)    Heart murmur    History of stomach ulcers    Hypertension    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   thelbert on 12/19/2012, that's the only one I've had (12/19/2012)   Migraines    Mixed diabetic hyperlipidemia associated with type 1 diabetes mellitus (HCC)    Nausea and vomiting 02/15/2018   Nausea vomiting and diarrhea 12/29/2012   Onychomycosis 02/07/2021   Orthopnoea    Paronychia of finger, left 02/07/2021   Pneumonia 2012   Polysubstance abuse (HCC)    Crack cocaine--none since 2008,  MJ, ETOH:  clean of all since 2008   Rectal bleeding 01/10/2021   Renal lithiasis    Bilateral   Rheumatoid arthritis (HCC) 12/15/2008   Qualifier: Diagnosis of   By: Loretha MD, [place]       Sebaceous cyst    Severe sepsis (HCC) 02/19/2022   Stroke (HCC) 2011   denies residual on 12/19/2012.  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  Return precaution discussed.  Patient voices understanding and is in agreement with plan.    Final diagnoses:  Atypical chest pain    ED Discharge Orders          Ordered    Ambulatory referral to Cardiology       Comments: If you have not heard from the Cardiology office within the next 72 hours please call [phone].   02/20/24 1425               Hildegard Loge, PA-C 02/20/24 1425    Neysa Caron PARAS, [place] 02/20/24 1615

## 2024-02-20 NOTE — ED Provider Triage Note (Signed)
 Emergency Medicine Provider Triage Evaluation Note  Kaleah Crotty , a 58 y.o. female  was evaluated in triage.  Pt complains of chest pain.  Head strike after a fall..  Review of Systems  Positive: Chest pain Negative: SOB, N/V/D  Physical Exam  BP 138/66 (BP Location: Left Arm)   Pulse 73   Temp 97.7 F (36.5 C)   Resp 19   LMP 01/04/2015   SpO2 94%  Gen:   Awake, no distress   Resp:  Normal effort  MSK:   Moves extremities without difficulty. Reproducible chest wall pain  Other:    Medical Decision Making  Medically screening exam initiated at 10:17 AM.  Appropriate orders placed.  Naketa Maready was informed that the remainder of the evaluation will be completed by another provider, this initial triage assessment does not replace that evaluation, and the importance of remaining in the ED until their evaluation is complete.  Patient has reproducible chest wall pain on the right side.  This is likely MSK in nature.  Will obtain cardiac workup.  EKG reviewed.  No changes compared to prior.  Ordered troponin.  Chest x-ray.   Did fall and strike head about a month ago.  Has had chronic headaches since then.  Will obtain CT head to evaluate for any obvious chronic subdural or other intracranial pathology.  No cervical thoracic or lumbar pain to palpation.   Simon Lavonia SAILOR, MD 02/20/24 1022

## 2024-02-20 NOTE — Research (Signed)
 Ms Hegstrom is here for [place] a research. Reviewed meds. Asked her if she has having any pain. She states that she has been having right side chest pain for a month. She rates the pain as a 6 on 0-10 pain scale at this time (0920). She points to the  right side of  her chest just above her breast. She describes the pain as sharp and feels like indigestion at times. She explains that nothing makes the pain better, but she is able to sleep. Once she wakes up the pain comes back. Nothing makes the pain worse except when she turns to her right side. She took NTG, but it did not help the pain. Asked her if she has seen a Dr for this, states no My cardiologist is no longer here, and I haven't found a new Dr yet She then states that the pain is going down her right arm, and that it feels kind of like she is having a stroke. Grips are equal, able to hold up both arms ans legs without difficultly. 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 Right eye is pin point. A slight droop noted to the right side of her mouth. Smile shows even less droop. Speech is slower than normal.   EKG completed. Dr Morris informed and evaluated EKG.  States he will come see her. After notifying Dr Morris she states I forget, I fell about a month ago, I tripped over my cat and hit the front of my head. Asked if she had any head aches, states yessinus headaches, but after I take bendryl I am fine. Complaints of chills, gave her a blanket. Noted to have sweat to her back. Labs drawn and Saline lock placed in left hand #20 by Suzen Hardy, RN at [phone]. Study drug was held. Taken to the ED to be evaluated.

## 2024-02-20 NOTE — Discharge Instructions (Signed)
 Your workup was reassuring.  No concerning cause of your chest pain.  Your chest pain was reproducible on palpation.  This is likely musculoskeletal.  Take Tylenol  for pain relief.  Follow-up with your primary care doctor and cardiologist.

## 2024-02-20 NOTE — ED Triage Notes (Addendum)
 Pt. Stated, I was upstairs for cardiac research and told I was having right chest pain and right arm pain since yesterday. I had a fall  a month ago and have had a headache ever since. Takes Benadryl  everyday but it just makes me sleepy. Suzen from Cardiac Research brought me down.  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

## 2024-02-21 ENCOUNTER — Encounter: Payer: Self-pay | Admitting: Physician Assistant

## 2024-02-21 ENCOUNTER — Ambulatory Visit: Attending: Physician Assistant | Admitting: Physician Assistant

## 2024-02-21 VITALS — BP 116/60 | HR 79 | Ht 60.0 in | Wt 197.0 lb

## 2024-02-21 DIAGNOSIS — R079 Chest pain, unspecified: Secondary | ICD-10-CM

## 2024-02-21 DIAGNOSIS — I10 Essential (primary) hypertension: Secondary | ICD-10-CM | POA: Diagnosis not present

## 2024-02-21 DIAGNOSIS — I25119 Atherosclerotic heart disease of native coronary artery with unspecified angina pectoris: Secondary | ICD-10-CM | POA: Diagnosis not present

## 2024-02-21 DIAGNOSIS — E785 Hyperlipidemia, unspecified: Secondary | ICD-10-CM

## 2024-02-21 MED ORDER — STUDY - OCEAN(A) - OLPASIRAN (AMG 890) 142 MG/ML OR PLACEBO SQ INJECTION (PI-HILTY)
142.0000 mg | PREFILLED_SYRINGE | Freq: Once | SUBCUTANEOUS | Status: DC
Start: 2024-02-21 — End: 2024-02-21
  Filled 2024-02-21: qty 1

## 2024-02-21 MED ORDER — STUDY - OCEAN(A) - OLPASIRAN (AMG 890) 142 MG/ML OR PLACEBO SQ INJECTION (PI-HILTY)
142.0000 mg | PREFILLED_SYRINGE | Freq: Once | SUBCUTANEOUS | Status: AC
  Administered 2024-02-21: 142 mg via SUBCUTANEOUS
  Filled 2024-02-21: qty 1

## 2024-02-21 NOTE — Addendum Note (Signed)
 Addended by: EFRAIN LAVANDA SQUIBB on: 02/21/2024 08:44 AM   Modules accepted: Orders

## 2024-02-21 NOTE — Addendum Note (Signed)
 Addended by: EFRAIN LAVANDA SQUIBB on: 02/21/2024 08:27 AM   Modules accepted: Orders

## 2024-02-21 NOTE — Research (Signed)
 [name] [name]  Week 120  Vitals: [x]  Med reviews: [x]  Labs: (week 24): [x]   Experience any AE/SAE/Hospitalizations [x]  Yes []  No  If yes please explain: AE to ER for chest pains not kept over night no sae to report discharged home in stable condition same day   Discussed with patient about the importance of not letting any one draw cholesterol or lipids. As to we are blinded to results. Reassured patient if we needed to be contacted the study team would reach out to our unblinded person.   ~no labs next visit 132 ~  Non-Fatal Potential Endpoint Assessment Yes  No   Has the subject experienced/undergone any of the following since the last visit/contact?   []   [x]    Any Coronary Artery Revascularization/Cerebrovascular Revascularization/ Peripheral Artery Revascularization/Amputation Procedure   []   [x]    Myocardial Infarction []   [x]    Stroke   []   [x]    Provide the date for the non-fatal Potential Endpoints status as of today visit date    []   [x]     IP admin please see MAR (please add Box # to comment section on MAR) [x]    Current Outpatient Medications:    acetaminophen  (TYLENOL ) 325 MG tablet, Take 2 tablets (650 mg total) by mouth every 6 (six) hours as needed for mild pain or fever., Disp: , Rfl:    albuterol  (VENTOLIN  HFA) 108 (90 Base) MCG/ACT inhaler, TAKE 2 PUFFS BY MOUTH EVERY 6 HOURS AS NEEDED FOR WHEEZE OR SHORTNESS OF BREATH, Disp: 18 each, Rfl: 1   ammonium lactate  (AMLACTIN) 12 % cream, APPLY TOPICALLY AS NEEDED FOR DRY SKIN, Disp: 420 g, Rfl: 1   Calcium -Magnesium -Vitamin D  (CITRACAL CALCIUM +D) 600-40-500 MG-MG-UNIT TB24, 1 tab by mouth daily, Disp: , Rfl:    clopidogrel  (PLAVIX ) 75 MG tablet, Take 1 tablet (75 mg total) by mouth daily. Please call [phone] to schedule an overdue appointment for future refills. Thank you. 1st attempt., Disp: 90 tablet, Rfl: 2   Continuous Blood Gluc Sensor (DEXCOM G6 SENSOR) MISC, SMARTSIG:Topical Once [name] Month, Disp: , Rfl:     Continuous Blood Gluc Transmit (DEXCOM G6 TRANSMITTER) MISC, AS DIRECTED 90 DAYS, Disp: , Rfl:    CVS D3 25 MCG (1000 UT) capsule, TAKE 1 CAPSULE BY MOUTH EVERY DAY, Disp: 90 capsule, Rfl: 0   cyclobenzaprine  (FLEXERIL ) 10 MG tablet, TAKE 1-2 TABLETS (10-20 MG TOTAL) BY MOUTH AT BEDTIME AS NEEDED FOR MUSCLE SPASMS. MUSCLE SPASMS, Disp: 30 tablet, Rfl: 0   diphenhydrAMINE  (BENADRYL ) 25 MG tablet, Take by mouth., Disp: , Rfl:    ENBREL SURECLICK 50 MG/ML injection, Inject 50 mg into the skin once [name] week., Disp: , Rfl:    folic acid  (FOLVITE ) 1 MG tablet, Take 1 mg by mouth daily., Disp: , Rfl:    furosemide  (LASIX ) 40 MG tablet, TAKE 1 TABLET BY MOUTH EVERY DAY IN THE MORNING, Disp: 90 tablet, Rfl: 3   gabapentin  (NEURONTIN ) 100 MG capsule, Take 1 capsule (100 mg total) by mouth at bedtime., Disp: 90 capsule, Rfl: 3   HUMALOG KWIKPEN 100 UNIT/ML KwikPen, Inject 100 Units into the skin.  3 UNITS AT BREAKFAST, 3 units at LUNCH, 3 UNITS AT EVENING MEAL, PLUS 2 EXTRA UNITS IF OVER 300 MG/DL Subcutaneous (max daily dose of 24 units) Subcutaneous for 90 days, Disp: , Rfl:    hydrOXYzine  (ATARAX ) 25 MG tablet, TAKE 1 TABLET BY MOUTH TWICE [name] DAY, Disp: 180 tablet, Rfl: 3   Incontinence Supply Disposable (POISE ULTRA THINS) PADS,  Use 5 pads daily as needed for urinary stress incontinence, Disp: 150 each, Rfl: 11   LANTUS  SOLOSTAR 100 UNIT/ML Solostar Pen, Inject 12 Units into the skin 2 (two) times daily., Disp: , Rfl:    leflunomide (ARAVA) 10 MG tablet, Take 10 mg by mouth daily., Disp: , Rfl:    metoprolol  tartrate (LOPRESSOR ) 25 MG tablet, 1&1/2 TABS BY MOUTH TWICE DAILY, Disp: 270 tablet, Rfl: 1   nicotine  polacrilex (NICORETTE ) 4 MG gum, Take 1 each (4 mg total) by mouth as needed for smoking cessation., Disp: 100 tablet, Rfl: 6   nitroGLYCERIN  (NITROSTAT ) 0.4 MG SL tablet, PLACE 1 TABLET UNDER THE TONGUE EVERY 5 MINUTES AS NEEDED FOR CHEST PAIN. MAX 3 DOSES, CALL 911., Disp: 26 tablet, Rfl: 6    ondansetron  (ZOFRAN ) 4 MG tablet, 1 TAB BY MOUTH EVERY 6 HOURS AS NEEDED FOR NAUSEA AND VOMITING., Disp: 12 tablet, Rfl: 1   pantoprazole  (PROTONIX ) 40 MG tablet, Take 40 mg by mouth 2 (two) times daily., Disp: , Rfl:    Pramlintide Acetate 1500 MCG/1.5ML SOPN, Inject 15 mcg into the skin 3 (three) times daily before meals., Disp: , Rfl:    prednisoLONE  acetate (PRED FORTE ) 1 % ophthalmic suspension, Place 4-5 drops into the left eye daily., Disp: , Rfl: 6   rosuvastatin  (CRESTOR ) 40 MG tablet, TAKE 1 TABLET BY MOUTH EVERY DAY, Disp: 90 tablet, Rfl: 3   sertraline  (ZOLOFT ) 100 MG tablet, Take 1 tablet (100 mg total) by mouth daily. 1 tab by mouth daily, Disp: 90 tablet, Rfl: 3   Study - [name]([name]) - olpasiran (AMG 890) 142 mg/mL or placebo SQ injection (PI-Hilty), Inject 142 mg into the skin once. For Investigational Use Only. Inject 1 mL (1 prefilled syringe) subcutaneously into appropriate injection site per protocol every 12 weeks in the clinic. (Approved injection site(s): upper arm, upper thigh & abdomen). Please contact [facility] with any questions or concerns regarding this medication., Disp: , Rfl:    traMADol  (ULTRAM ) 50 MG tablet, TAKE 1 TABLET EVERY 6 HOURS AS NEEDED FOR MODERATE PAIN, Disp: 90 tablet, Rfl: 2

## 2024-02-21 NOTE — Patient Instructions (Signed)
 Medication Instructions:  Your physician recommends that you continue on your current medications as directed. Please refer to the Current Medication list given to you today.  *If you need a refill on your cardiac medications before your next appointment, please call your pharmacy*  Lab Work: NONE ordered at this time of appointment   Testing/Procedures: NONE ordered at this time of appointment   Follow-Up: At [facility], you and your health needs are our priority.  As part of our continuing mission to provide you with exceptional heart care, our providers are all part of one team.  This team includes your primary Cardiologist (physician) and Advanced Practice Providers or APPs (Physician Assistants and Nurse Practitioners) who all work together to provide you with the care you need, when you need it.  Your next appointment:   6 month(s)  Provider:   Dr. Elmira       We recommend signing up for the patient portal called MyChart.  Sign up information is provided on this After Visit Summary.  MyChart is used to connect with patients for Virtual Visits (Telemedicine).  Patients are able to view lab/test results, encounter notes, upcoming appointments, etc.  Non-urgent messages can be sent to your provider as well.   To learn more about what you can do with MyChart, go to [email or web address].

## 2024-02-21 NOTE — Progress Notes (Unsigned)
 Cardiology Office Note   Date:  02/21/2024  ID:  [name] [name], DOB 03/17/1966, MRN [id number] PCP: Adella Norris, MD  [facility] Providers Cardiologist:  Candyce Reek, MD  -> Dr. Elmira Finn to update primary MD,subspecialty MD or APP then REFRESH:1}    History of Present Illness [name] [name] is a 58 y.o. female with past medical history of CAD, history of esophageal ulcer, CKD stage III, type 1 diabetes mellitus, hypertension, hyperlipidemia, morbid obesity and history of tobacco abuse.  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  She had an NSTEMI in May 2014 and had a stent placed in her left circumflex artery.  She had several eye surgeries in late 2014 in early 2015, and had a bleeding issue at the time, therefore Plavix  was stopped before she completed her 32-month course post MI. Her left eye's vision is severely decreased.  Echocardiogram obtained on 08/12/2020 showed EF 55 to 60%, normal RV, mild LAE, trivial MR.  Repeat cardiac catheterization performed in January 2022 showed multivessel CAD was sequential 60 to 70% lesion and 20 to 30% mid LAD stenosis with DFR of 0.85, 40% mid left circumflex lesion, chronic total occlusion of the mid to distal RCA was faint left-to-right collaterals filling of the distal RCA.  Severe in-stent restenosis of proximal RCA stent prior to the CTO.  Patient underwent DFR guided PCI of mid LAD using resolute Onyx 2.75 x 26 mm DES postdilated to 3.1 mm.  Patient was ultimately discharged from aspirin  and Plavix  with the instruction of discontinue aspirin  after 1 month.  Patient was last seen by Dr. Reek in April 2024.  Patient is currently involved in [place] A clinical trial.  She was contacted by researcher yesterday to review medication, at which time she reported right-sided chest pain for a month.  She also fell a month ago and struck her head.  She was instructed to go to the emergency room.  Lab work in the emergency room  showed a creatinine of 1.3, hemoglobin of 15.1.  Troponin negative x 2.  Chest x-ray showed no acute finding.  CT of the head showed no acute abnormality, moderate chronic small vessel ischemic disease.  Patient was given a single dose of potassium supplement for IV hypokalemia of 3.3.  It was suspected chest discomfort is musculoskeletal in etiology.  Patient presents today for follow-up.  She says her right chest discomfort occurred since April after her big dog suddenly running forward and pulled her arm.  It is tender to touch and tender to rotate her shoulder.  She does not feel this symptom when she walk around and exert herself.  The symptom is clearly musculoskeletal in nature.  At this time I do not recommend any further workup.  Since her cardiologist Dr. Reek has left our practice, I will set her up with Dr. Elmira.  She can follow-up with Dr. Elmira in 6 months.  ROS: ***  Studies Reviewed      *** Risk Assessment/Calculations {Does this patient have ATRIAL FIBRILLATION?:[phone]}         Physical Exam VS:  BP 116/60 (BP Location: Left Arm, Patient Position: Sitting)   Pulse 79   Ht 5' (1.524 m)   Wt 197 lb (89.4 kg)   LMP 01/04/2015   SpO2 96%   BMI 38.47 kg/m        Wt Readings from Last 3 Encounters:  02/21/24 197 lb (89.4 kg)  02/11/24 191 lb (  86.6 kg)  02/04/24 191 lb (86.6 kg)    GEN: Well nourished, well developed in no acute distress NECK: No JVD; No carotid bruits CARDIAC: ***RRR, no murmurs, rubs, gallops RESPIRATORY:  Clear to auscultation without rales, wheezing or rhonchi  ABDOMEN: Soft, non-tender, non-distended EXTREMITIES:  No edema; No deformity   ASSESSMENT AND PLAN ***    {Are you ordering a CV Procedure (e.g. stress test, cath, DCCV, TEE, etc)?   Press F2        :[id number]}  Dispo: ***  Signed, Scot Ford, PA

## 2024-02-22 NOTE — Research (Addendum)
 Are there any labs that are clinically significant?  Yes []  OR No[x]   Please FORWARD back to me with any changes or follow up!   Creatinine has been up and down.  Vinie KYM Maxcy, MD, [facility], FNLA, FACP  [place]  [facility]  Medical Director of the Advanced Lipid Disorders &  Cardiovascular Risk Reduction Clinic Diplomate of the American Board of Clinical Lipidology Attending Cardiologist  Direct Dial: [phone]  Fax: [phone]  Website:  [email or web address]                                          ACCESSION NO. [id number]                                             Page 1 of 1                                                        INVESTIGATOR: ([id number])                          PROTOCOL   [id number]                     Vinie Maxcy M.D.                             INVESTIGATOR NO.: [id number]                     c/o Reena Lies                              SUBJECT ID: [id number]                     [facility]                 SUBJECT INITIALS NOT COLLECTED:                     [address] [place] [address]                 VISIT: PATRIC [place], [place]  [id number]                   SPONSOR REPORT TO:                 COLLECTION TIME:09:50 DATE:20-Feb-2024                     Leora Shoe                      DATE RECEIVED IN LABORATORY: 21-Feb-2024                     c/o Sponsor(or Addtl) ElEC.Study DATE REPORTED BY LABORATORY: 21-Feb-2024  [facility]                          SEX: F  BIRTHDATE:  29-Jan-1966    AGE: 58y                     [address]                  SELMA NO.: [phone]                   [place], [place]  [phone]                                                                                        Ref. Ranges               Clinical    Comments                                                                          Significance                                                                             Yes*  No                    SM AMG890 ANTIBODY COLL D/T                      CDate PreD     20-Feb-2024                                               CTime PreD     09:50        Accession No: [id number]                     Collection Date: 20-Feb-2024     Patient Wlfazm:[id number]                               Time: 09:50:00 AM   Patient Initials:                                         Visit: W120  Q24W       Test                               Code      Description      Group [id number] CHEMISTRY PANEL      RCT5 AST (SGOT)                    ADC156    Hemolysis-test not performed      [id number] D-BilGen2                  ADC156    Hemolysis-test not performed      Group [id number] CHEMISTRY PANEL      [id number] Ind Bili                   SCC07     Unable to calculate                    CHEMISTRY PANEL                      Total Bili     0.5          0.2-1.2 mg/dL                                D-BilGen2      Hemolysis-test not performed                                                          0.00-0.36 mg/dL                      Ind Bili       Unable to calculate                                                                   0.0-1.2 mg/dL                      Alk Phos       170     H    35-104 U/L       138 = (08/29/2023)                                 ALT (SGPT)     9            4-43 U/L                                    AST (SGOT)     Hemolysis-test not performed  8-40 U/L                      Urea Nitr      26      H    4-24 mg/dL              18 = 98/70/7974                                   Creatinine     1.40    H    0.35-1.14 mg/dL          8.92 = 98/70/7974                                Calcium         9.6          8.3-10.6 mg/dL                              Total Prot     7.8          6.1-8.4 g/dL                                 Alb BCG         4.3          3.3-4.9 g/dL                                 CK             55           26-192 U/L                                   Sodium         141          132-147 mEq/L                                Potassium      3.7          3.5-5.2 mEq/L                                Bicarb         18.0    L    19.3-29.3 mEq/L      16.7 = 08/29/2023                                  Chloride       104          94-112 mEq/L                               ADJUSTED CALCIUM                       Adj Calc  9.6          8.3-10.6 mg/dL                     HEMATOLOGY&DIFFERENTIAL PANEL                      HGB            15.3         11.6-16.4 g/dL                              HCT            47           34-48 %                                      RBC            5.3          4.1-5.6 x106/uL                              MCH            29           26-34 pg                                    MCHC           33           31-38 g/dL                                   RDW            16.3    H    12.0-15.0 %             18.2 = 08/29/2023                                  RBC Morph      No Review Required                                       MCV            87           79-98 fL                                    WBC            7.26         3.80-10.70 x103/uL                           Neutrophil     5.01         1.96-7.23 x103/uL  Lymphocyte     1.56         0.91-4.28 x103/uL                           Monocytes      0.40         0.12-0.92 x103/uL                           Eosinophil     0.17         0.00-0.57 x103/uL                           Basophils      0.13         0.00-0.20 x103/uL                           Neutrophil     68.9         40.5-75.0 %                                 Lymphocyte     21.5         15.4-48.5%                                   Monocytes      5.5          2.6-10.1 %                                   Eosinophil     2.3          0.0-6.8 %                                     Basophils      1.7          0.0-2.0 %                                    Platelets      196          140-400 x103/uL                            ANC                      ANC            5.01         1.96-7.23 x103/uL          HBA1C                      Hgb A1c        7.4     H    <6.5%       7.1 = 08/29/2023                     EGFR  CKDEPI eGF     44           mL/min/1.73m2                                                            No Ref Rng                     DECREASE >/= EGFR 50%                      eGFR > 50%     Criteria not met   ALT > 3 X ULN                      ALT>3XULN      Criteria not met                                        ALT > 5 X ULN                      ALT>5XULN      Criteria not met                                        ALT > 8 X ULN                      ALT>8XULN      Criteria not met                                        ALT & TBIL                      ALT & TBIL     Criteria not met                                        AST > 3 X ULN                      AST>3XULN      Required testing unavailable                            AST > 5 X ULN                      AST>5XULN      Required testing unavailable                            AST > 8 X ULN                      AST>8XULN      Required testing unavailable  AST & TBIL                      AST & TBIL     Required testing unavailable       ANION GAP                      Anion Gap      23      H    7-18 mEq/L      20 = 08/29/2023

## 2024-02-25 NOTE — Research (Addendum)
 Are there any labs that are clinically significant?  Yes []  OR No[x]   Please FORWARD back to me with any changes or follow up!    Vinie KYM Maxcy, MD, [facility], FNLA, FACP  [facility]  Medical Director of the Advanced Lipid Disorders &  Cardiovascular Risk Reduction Clinic Diplomate of the American Board of Clinical Lipidology Attending Cardiologist  Direct Dial: [phone]  Fax: [phone]  Website:  [email or web address]                     COAGULATION GROUP                      APTT           23.8         21.9-29.4 sec                                PT             10.4         9.7-12.3 sec                                 INR            1.0          Patient not taking                                                       oral anticoagulant:                                                      0.8 - 1.2                                                                Patient taking                                                          oral anticoagulant:                                                      2.0 - 3.0                                  ALT & INR  ALT & INR      Criteria not met                                        AST & INR                      AST & INR      Required testing unavailable

## 2024-03-26 ENCOUNTER — Telehealth: Payer: Self-pay | Admitting: Internal Medicine

## 2024-03-26 NOTE — Telephone Encounter (Signed)
 Patient called and states her united health care insurance needs prior authorization for her Sensors.  Patient states she was told to call her doctor's office and notified they need prior authorization.

## 2024-03-27 ENCOUNTER — Telehealth: Payer: Self-pay

## 2024-03-27 NOTE — Telephone Encounter (Signed)
 Good Morning Hao,  We have received a surgical clearance request for ORIF of the middle finger. They were seen recently in clinic on 02/17/2024. Can you please comment on surgical clearance for upcoming procedure. Please forward you guidance and recommendations to P CV DIV PREOP   Thanks, Jackee

## 2024-03-27 NOTE — Telephone Encounter (Signed)
   Pre-operative Risk Assessment    Patient Name: [name] [name]  DOB: [birth year] MRN: [id number]   Date of last office visit: 02/21/24 HAO MENG, PA Date of next office visit: NONE   Request for Surgical Clearance    Procedure:  ORIF LEFT MIDDLE FINGER PHALANX  Date of Surgery:  Clearance TBD                                Surgeon:  OZELL HOCK, MD Surgeon's Group or Practice Name:  [facility] Phone number:  [phone] Fax number:  [phone]   Type of Clearance Requested:   - Medical  - Pharmacy:  Hold Clopidogrel  (Plavix )     Type of Anesthesia:  Not Indicated   Additional requests/questions:    Signed, Lucie DELENA Ku   03/27/2024, 8:22 AM

## 2024-03-28 ENCOUNTER — Other Ambulatory Visit: Payer: Self-pay

## 2024-03-28 NOTE — Telephone Encounter (Signed)
 Have her call endocrinology as having difficulty sending.

## 2024-04-01 NOTE — Telephone Encounter (Signed)
 Spoke with pt. This am. She fine with contacted endocrinologist

## 2024-04-05 NOTE — Telephone Encounter (Signed)
   Patient Name: [name] [name]  DOB: 1965-08-24 MRN: [id number]  Primary Cardiologist: Candyce Reek, MD  Chart reviewed as part of pre-operative protocol coverage. Given past medical history and time since last visit, based on ACC/AHA guidelines, [name] [name] is at acceptable risk for the planned procedure without further cardiovascular testing.   If needed she may hold plavix  for 5-7 days prior to the procedure and restart as soon as possible afterward at the surgeon's discretion.   I will route this recommendation to the requesting party via Epic fax function and remove from pre-op pool.  Please call with questions.  Suzanne Kho, [place] 04/05/2024, 4:32 PM

## 2024-04-08 ENCOUNTER — Other Ambulatory Visit: Payer: Self-pay | Admitting: Internal Medicine

## 2024-04-09 NOTE — Telephone Encounter (Signed)
   Patient Name: [name] [name]  DOB: Nov 19, 1965 MRN: [id number]  Primary Cardiologist: Candyce Reek, MD  Chart reviewed as part of pre-operative protocol coverage. Given past medical history and time since last visit, based on ACC/AHA guidelines, Rabiah Curnow is at acceptable risk for the planned procedure without further cardiovascular testing.   Chart reviewed as part of pre-operative protocol coverage. Given past medical history and time since last visit, based on ACC/AHA guidelines, Nikesha Allbaugh is at acceptable risk for the planned procedure without further cardiovascular testing.    If needed she may hold plavix  for 5-7 days prior to the procedure and restart as soon as possible afterward at the surgeon's discretion.    I will route this recommendation to the requesting party via Epic fax function and remove from pre-op pool.   Please call with questions.   Hao Meng, [place]  The patient was advised that if she develops new symptoms prior to surgery to contact our office to arrange for a follow-up visit, and she verbalized understanding.  I will route this recommendation to the requesting party via Epic fax function and remove from pre-op pool.  Please call with questions.  Lamarr Satterfield, NP 04/09/2024, 9:57 AM

## 2024-04-11 ENCOUNTER — Other Ambulatory Visit: Payer: Self-pay | Admitting: Internal Medicine

## 2024-04-25 ENCOUNTER — Other Ambulatory Visit: Payer: Self-pay | Admitting: Internal Medicine

## 2024-05-13 ENCOUNTER — Ambulatory Visit: Admitting: Podiatry

## 2024-05-13 ENCOUNTER — Encounter: Payer: Self-pay | Admitting: Podiatry

## 2024-05-13 DIAGNOSIS — M79675 Pain in left toe(s): Secondary | ICD-10-CM

## 2024-05-13 DIAGNOSIS — B351 Tinea unguium: Secondary | ICD-10-CM

## 2024-05-13 DIAGNOSIS — M79674 Pain in right toe(s): Secondary | ICD-10-CM

## 2024-05-13 DIAGNOSIS — E1149 Type 2 diabetes mellitus with other diabetic neurological complication: Secondary | ICD-10-CM

## 2024-05-28 ENCOUNTER — Encounter: Admitting: *Deleted

## 2024-05-28 DIAGNOSIS — Z006 Encounter for examination for normal comparison and control in clinical research program: Secondary | ICD-10-CM

## 2024-05-28 MED ORDER — STUDY - OCEAN(A) - OLPASIRAN (AMG 890) 142 MG/ML OR PLACEBO SQ INJECTION (PI-HILTY)
142.0000 mg | PREFILLED_SYRINGE | Freq: Once | SUBCUTANEOUS | Status: AC
  Administered 2024-05-28: 142 mg via SUBCUTANEOUS
  Filled 2024-05-28: qty 1

## 2024-05-28 NOTE — Research (Signed)
 [name] [name] [name]  Week 132  Vitals: [x]  Med reviews: [x]  Experience any AE/SAE/Hospitalizations [x]  Yes []  No  If yes please explain: Had finger surgery on her left middle finger 05/07/2024 outside hospital   Discussed with patient about the importance of not letting any one draw cholesterol or lipids. As to we are blinded to results. Reassured patient if we needed to be contacted the study team would reach out to our unblinded person.   ~MD to see and fasting labs next visit 144~  Non-Fatal Potential Endpoint Assessment Yes  No   Has the subject experienced/undergone any of the following since the last visit/contact?   []   [x]    Any Coronary Artery Revascularization/Cerebrovascular Revascularization/ Peripheral Artery Revascularization/Amputation Procedure   []   [x]    Myocardial Infarction []   [x]    Stroke   []   [x]    Provide the date for the non-fatal Potential Endpoints status as of today visit date    []   [x]     IP admin please see MAR (please add Box # to comment section on MAR) [x]     Current Outpatient Medications:    acetaminophen  (TYLENOL ) 325 MG tablet, Take 2 tablets (650 mg total) by mouth every 6 (six) hours as needed for mild pain or fever., Disp: , Rfl:    albuterol  (VENTOLIN  HFA) 108 (90 Base) MCG/ACT inhaler, TAKE 2 PUFFS BY MOUTH EVERY 6 HOURS AS NEEDED FOR WHEEZE OR SHORTNESS OF BREATH, Disp: 18 each, Rfl: 1   ammonium lactate  (AMLACTIN) 12 % cream, APPLY TOPICALLY AS NEEDED FOR DRY SKIN, Disp: 420 g, Rfl: 1   Calcium -Magnesium -Vitamin D  (CITRACAL CALCIUM +D) 600-40-500 MG-MG-UNIT TB24, 1 tab by mouth daily, Disp: , Rfl:    clopidogrel  (PLAVIX ) 75 MG tablet, Take 1 tablet (75 mg total) by mouth daily. Please call [phone] to schedule an overdue appointment for future refills. Thank you. 1st attempt., Disp: 90 tablet, Rfl: 2   Continuous Blood Gluc Sensor (DEXCOM G6 SENSOR) MISC, SMARTSIG:Topical Once [name] [name] Month, Disp: , Rfl:    Continuous Blood Gluc Transmit (DEXCOM G6  TRANSMITTER) MISC, AS DIRECTED 90 DAYS, Disp: , Rfl:    [employer] D3 25 MCG (1000 UT) capsule, TAKE 1 CAPSULE BY MOUTH EVERY DAY, Disp: 90 capsule, Rfl: 0   cyclobenzaprine  (FLEXERIL ) 10 MG tablet, TAKE 1-2 TABLETS (10-20 MG TOTAL) BY MOUTH AT BEDTIME AS NEEDED FOR MUSCLE SPASMS. MUSCLE SPASMS, Disp: 30 tablet, Rfl: 0   diphenhydrAMINE  (BENADRYL ) 25 MG tablet, Take by mouth., Disp: , Rfl:    ENBREL SURECLICK 50 MG/ML injection, Inject 50 mg into the skin once [name] [name] week., Disp: , Rfl:    folic acid  (FOLVITE ) 1 MG tablet, Take 1 mg by mouth daily., Disp: , Rfl:    furosemide  (LASIX ) 40 MG tablet, TAKE 1 TABLET BY MOUTH EVERY DAY IN THE MORNING, Disp: 90 tablet, Rfl: 3   gabapentin  (NEURONTIN ) 100 MG capsule, Take 1 capsule (100 mg total) by mouth at bedtime. (Patient taking differently: Take 100 mg by mouth as needed (as needed to help sleep).), Disp: 90 capsule, Rfl: 3   HUMALOG KWIKPEN 100 UNIT/ML KwikPen, Inject 100 Units into the skin.  3 UNITS AT BREAKFAST, 3 units at LUNCH, 3 UNITS AT EVENING MEAL, PLUS 2 EXTRA UNITS IF OVER 300 MG/DL Subcutaneous (max daily dose of 24 units) Subcutaneous for 90 days, Disp: , Rfl:    hydrOXYzine  (ATARAX ) 25 MG tablet, TAKE 1 TABLET BY MOUTH TWICE [name] [name] DAY, Disp: 180 tablet, Rfl: 4   Incontinence Supply Disposable (  POISE ULTRA THINS) PADS, Use 5 pads daily as needed for urinary stress incontinence, Disp: 150 each, Rfl: 11   LANTUS  SOLOSTAR 100 UNIT/ML Solostar Pen, Inject 12 Units into the skin 2 (two) times daily., Disp: , Rfl:    leflunomide (ARAVA) 10 MG tablet, Take 10 mg by mouth daily., Disp: , Rfl:    metoprolol  tartrate (LOPRESSOR ) 25 MG tablet, 1&1/2 TABS BY MOUTH TWICE DAILY, Disp: 270 tablet, Rfl: 1   nitroGLYCERIN  (NITROSTAT ) 0.4 MG SL tablet, PLACE 1 TABLET UNDER THE TONGUE EVERY 5 MINUTES AS NEEDED FOR CHEST PAIN. MAX 3 DOSES, CALL 911., Disp: 26 tablet, Rfl: 6   ondansetron  (ZOFRAN ) 4 MG tablet, 1 TAB BY MOUTH EVERY 6 HOURS AS NEEDED FOR NAUSEA AND VOMITING.,  Disp: 12 tablet, Rfl: 1   pantoprazole  (PROTONIX ) 40 MG tablet, Take 40 mg by mouth 2 (two) times daily., Disp: , Rfl:    Pramlintide Acetate 1500 MCG/1.5ML SOPN, Inject 15 mcg into the skin 3 (three) times daily before meals., Disp: , Rfl:    prednisoLONE  acetate (PRED FORTE ) 1 % ophthalmic suspension, Place 4-5 drops into the left eye daily., Disp: , Rfl: 6   rosuvastatin  (CRESTOR ) 40 MG tablet, TAKE 1 TABLET BY MOUTH EVERY DAY, Disp: 90 tablet, Rfl: 3   sertraline  (ZOLOFT ) 100 MG tablet, Take 1 tablet (100 mg total) by mouth daily. 1 tab by mouth daily, Disp: 90 tablet, Rfl: 3   Study - [name]([name] [name]) - olpasiran (AMG 890) 142 mg/mL or placebo SQ injection (PI-Hilty), Inject 142 mg into the skin once. For Investigational Use Only. Inject 1 mL (1 prefilled syringe) subcutaneously into appropriate injection site per protocol every 12 weeks in the clinic. (Approved injection site(s): upper arm, upper thigh & abdomen). Please contact [facility] with any questions or concerns regarding this medication., Disp: , Rfl:    traMADol  (ULTRAM ) 50 MG tablet, TAKE 1 TABLET EVERY 6 HOURS AS NEEDED FOR MODERATE PAIN, Disp: 90 tablet, Rfl: 2   nicotine  polacrilex (NICORETTE ) 4 MG gum, Take 1 each (4 mg total) by mouth as needed for smoking cessation. (Patient not taking: Reported on 05/28/2024), Disp: 100 tablet, Rfl: 6  Current Facility-Administered Medications:    Study - [name]([name] [name]) - olpasiran (AMG 890) 142 mg/mL or placebo SQ injection (PI-Hilty), 142 mg, Subcutaneous, Once,

## 2024-05-30 ENCOUNTER — Other Ambulatory Visit (INDEPENDENT_AMBULATORY_CARE_PROVIDER_SITE_OTHER)

## 2024-05-30 DIAGNOSIS — Z0000 Encounter for general adult medical examination without abnormal findings: Secondary | ICD-10-CM

## 2024-05-31 LAB — COMPREHENSIVE METABOLIC PANEL WITH GFR
ALT: 6 IU/L (ref 0–32)
AST: 19 IU/L (ref 0–40)
Albumin: 3.9 g/dL (ref 3.8–4.9)
Alkaline Phosphatase: 159 IU/L — ABNORMAL HIGH (ref 49–135)
BUN/Creatinine Ratio: 13 (ref 9–23)
BUN: 16 mg/dL (ref 6–24)
Bilirubin Total: 0.6 mg/dL (ref 0.0–1.2)
CO2: 23 mmol/L (ref 20–29)
Calcium: 9.3 mg/dL (ref 8.7–10.2)
Chloride: 102 mmol/L (ref 96–106)
Creatinine, Ser: 1.22 mg/dL — ABNORMAL HIGH (ref 0.57–1.00)
Globulin, Total: 3.1 g/dL (ref 1.5–4.5)
Glucose: 82 mg/dL (ref 70–99)
Potassium: 3.8 mmol/L (ref 3.5–5.2)
Sodium: 141 mmol/L (ref 134–144)
Total Protein: 7 g/dL (ref 6.0–8.5)
eGFR: 52 mL/min/1.73 — ABNORMAL LOW (ref >59)

## 2024-05-31 LAB — CBC WITH DIFFERENTIAL/PLATELET
Basophils Absolute: 0.1 x10E3/uL (ref 0.0–0.2)
Basos: 1 %
EOS (ABSOLUTE): 0.2 x10E3/uL (ref 0.0–0.4)
Eos: 2 %
Hematocrit: 44.5 % (ref 34.0–46.6)
Hemoglobin: 13.5 g/dL (ref 11.1–15.9)
Immature Grans (Abs): 0 x10E3/uL (ref 0.0–0.1)
Immature Granulocytes: 0 %
Lymphocytes Absolute: 1.8 x10E3/uL (ref 0.7–3.1)
Lymphs: 25 %
MCH: 27.4 pg (ref 26.6–33.0)
MCHC: 30.3 g/dL — ABNORMAL LOW (ref 31.5–35.7)
MCV: 90 fL (ref 79–97)
Monocytes Absolute: 0.9 x10E3/uL (ref 0.1–0.9)
Monocytes: 12 %
Neutrophils Absolute: 4.3 x10E3/uL (ref 1.4–7.0)
Neutrophils: 60 %
Platelets: 205 x10E3/uL (ref 150–450)
RBC: 4.93 x10E6/uL (ref 3.77–5.28)
RDW: 14.4 % (ref 11.7–15.4)
WBC: 7.2 x10E3/uL (ref 3.4–10.8)

## 2024-05-31 LAB — MICROALBUMIN / CREATININE URINE RATIO
Creatinine, Urine: 7.3 mg/dL
Microalbumin, Urine: 18.2 ug/mL

## 2024-05-31 LAB — HEMOGLOBIN A1C
Est. average glucose Bld gHb Est-mCnc: 146 mg/dL
Hgb A1c MFr Bld: 6.7 % — ABNORMAL HIGH (ref 4.8–5.6)

## 2024-05-31 LAB — TSH: TSH: 0.825 u[IU]/mL (ref 0.450–4.500)

## 2024-06-02 ENCOUNTER — Other Ambulatory Visit

## 2024-06-04 ENCOUNTER — Ambulatory Visit (INDEPENDENT_AMBULATORY_CARE_PROVIDER_SITE_OTHER): Payer: Medicaid Other | Admitting: Internal Medicine

## 2024-06-04 ENCOUNTER — Encounter: Payer: Self-pay | Admitting: Internal Medicine

## 2024-06-04 VITALS — BP 140/78 | HR 74 | Resp 19 | Ht 60.0 in | Wt 197.0 lb

## 2024-06-04 DIAGNOSIS — M20002 Unspecified deformity of left finger(s): Secondary | ICD-10-CM

## 2024-06-04 DIAGNOSIS — Z23 Encounter for immunization: Secondary | ICD-10-CM | POA: Diagnosis not present

## 2024-06-04 DIAGNOSIS — M059 Rheumatoid arthritis with rheumatoid factor, unspecified: Secondary | ICD-10-CM | POA: Diagnosis not present

## 2024-06-04 DIAGNOSIS — M8589 Other specified disorders of bone density and structure, multiple sites: Secondary | ICD-10-CM | POA: Insufficient documentation

## 2024-06-04 DIAGNOSIS — Z0000 Encounter for general adult medical examination without abnormal findings: Secondary | ICD-10-CM

## 2024-06-04 DIAGNOSIS — F17200 Nicotine dependence, unspecified, uncomplicated: Secondary | ICD-10-CM

## 2024-06-04 DIAGNOSIS — E1069 Type 1 diabetes mellitus with other specified complication: Secondary | ICD-10-CM

## 2024-06-04 DIAGNOSIS — I25119 Atherosclerotic heart disease of native coronary artery with unspecified angina pectoris: Secondary | ICD-10-CM

## 2024-06-04 DIAGNOSIS — E118 Type 2 diabetes mellitus with unspecified complications: Secondary | ICD-10-CM

## 2024-06-04 DIAGNOSIS — E782 Mixed hyperlipidemia: Secondary | ICD-10-CM

## 2024-06-04 DIAGNOSIS — Z716 Tobacco abuse counseling: Secondary | ICD-10-CM

## 2024-06-04 DIAGNOSIS — I10 Essential (primary) hypertension: Secondary | ICD-10-CM

## 2024-06-04 NOTE — Progress Notes (Signed)
 Subjective:    Patient ID: [name] [name], female   DOB: Feb 08, 1966, 58 y.o.   MRN: [id number]   HPI  CPE without pap  1.  Pap:  Last in 05/2023 and normal.  Did have suggestion of BV.    2.  Mammogram:  Last 02/04/2024 and normal.  Mother with history of breast cancer diagnosed at age 61.  Died same age of same.    3.  Osteoprevention:  Taking calcium , unknown strength, but only taking one daily. and Vitamin D  3 1000 units daily.  Still smoking 5 cigs daily.  Walks the dog once daily about 1/2 mile.    4.  Guaiac Cards/FIT:    5.  Colonoscopy:  Last colonoscopy in 2022 for 3 year follow up after adenoma found in 2019.  She is not due until 2027.  Dr. Saintclair Ee physicians.  6.  Immunizations:  Needs pneumococcal 20 and COVID today.    7.  Glucose/Cholesterol:  DM with A1C at goal at 6.7%.  Cholesterol at goal 06/2023, but not done recently.  Will see if can add.  8.  Other:  Right middle finger ?combination of fracture and destructive lesion of joint--had surgery for the DIP recently and plans for more.  She is finding she is having more joint pain--and she apparently is to hold Embrel and Arava for one week before and 2 weeks after upcoming surgery.  She was given Oxycodone  by ortho, but feels it was too strong.  Dr. JENEANE, Rheum would like ortho to direct pain med care.  Current Meds  Medication Sig   acetaminophen  (TYLENOL ) 325 MG tablet Take 2 tablets (650 mg total) by mouth every 6 (six) hours as needed for mild pain or fever.   albuterol  (VENTOLIN  HFA) 108 (90 Base) MCG/ACT inhaler TAKE 2 PUFFS BY MOUTH EVERY 6 HOURS AS NEEDED FOR WHEEZE OR SHORTNESS OF BREATH   ammonium lactate  (AMLACTIN) 12 % cream APPLY TOPICALLY AS NEEDED FOR DRY SKIN   Calcium -Magnesium -Vitamin D  (CITRACAL CALCIUM +D) 600-40-500 MG-MG-UNIT TB24 1 tab by mouth daily   clopidogrel  (PLAVIX ) 75 MG tablet Take 1 tablet (75 mg total) by mouth daily. Please call [phone] to schedule an overdue appointment for future  refills. Thank you. 1st attempt.   Continuous Blood Gluc Sensor (DEXCOM G6 SENSOR) MISC SMARTSIG:Topical Once a Month   Continuous Blood Gluc Transmit (DEXCOM G6 TRANSMITTER) MISC AS DIRECTED 90 DAYS   [employer] D3 25 MCG (1000 UT) capsule TAKE 1 CAPSULE BY MOUTH EVERY DAY   cyclobenzaprine  (FLEXERIL ) 10 MG tablet TAKE 1-2 TABLETS (10-20 MG TOTAL) BY MOUTH AT BEDTIME AS NEEDED FOR MUSCLE SPASMS. MUSCLE SPASMS   diphenhydrAMINE  (BENADRYL ) 25 MG tablet Take by mouth.   ENBREL SURECLICK 50 MG/ML injection Inject 50 mg into the skin once a week.   folic acid  (FOLVITE ) 1 MG tablet Take 1 mg by mouth daily.   furosemide  (LASIX ) 40 MG tablet TAKE 1 TABLET BY MOUTH EVERY DAY IN THE MORNING   gabapentin  (NEURONTIN ) 100 MG capsule Take 1 capsule (100 mg total) by mouth at bedtime. (Patient taking differently: Take 100 mg by mouth as needed (as needed to help sleep).)   HUMALOG KWIKPEN 100 UNIT/ML KwikPen Inject 100 Units into the skin.  3 UNITS AT BREAKFAST, 3 units at LUNCH, 3 UNITS AT EVENING MEAL, PLUS 2 EXTRA UNITS IF OVER 300 MG/DL Subcutaneous (max daily dose of 24 units) Subcutaneous for 90 days   hydrOXYzine  (ATARAX ) 25 MG tablet TAKE 1 TABLET  BY MOUTH TWICE A DAY   Incontinence Supply Disposable (POISE ULTRA THINS) PADS Use 5 pads daily as needed for urinary stress incontinence   LANTUS  SOLOSTAR 100 UNIT/ML Solostar Pen Inject 12 Units into the skin 2 (two) times daily.   leflunomide (ARAVA) 10 MG tablet Take 10 mg by mouth daily.   metoprolol  tartrate (LOPRESSOR ) 25 MG tablet 1&1/2 TABS BY MOUTH TWICE DAILY   nicotine  polacrilex (NICORETTE ) 4 MG gum Take 1 each (4 mg total) by mouth as needed for smoking cessation.   nitroGLYCERIN  (NITROSTAT ) 0.4 MG SL tablet PLACE 1 TABLET UNDER THE TONGUE EVERY 5 MINUTES AS NEEDED FOR CHEST PAIN. MAX 3 DOSES, CALL 911.   ondansetron  (ZOFRAN ) 4 MG tablet 1 TAB BY MOUTH EVERY 6 HOURS AS NEEDED FOR NAUSEA AND VOMITING.   pantoprazole  (PROTONIX ) 40 MG tablet Take 40 mg by  mouth 2 (two) times daily.   Pramlintide Acetate 1500 MCG/1.5ML SOPN Inject 15 mcg into the skin 3 (three) times daily before meals.   rosuvastatin  (CRESTOR ) 40 MG tablet TAKE 1 TABLET BY MOUTH EVERY DAY   sertraline  (ZOLOFT ) 100 MG tablet Take 1 tablet (100 mg total) by mouth daily. 1 tab by mouth daily   Study - [name](A) - olpasiran (AMG 890) 142 mg/mL or placebo SQ injection (PI-Hilty) Inject 142 mg into the skin once. For Investigational Use Only. Inject 1 mL (1 prefilled syringe) subcutaneously into appropriate injection site per protocol every 12 weeks in the clinic. (Approved injection site(s): upper arm, upper thigh & abdomen). Please contact [facility] with any questions or concerns regarding this medication.   traMADol  (ULTRAM ) 50 MG tablet TAKE 1 TABLET EVERY 6 HOURS AS NEEDED FOR MODERATE PAIN   Allergies  Allergen Reactions   Aspirin  Other (See Comments)    Stomach bleeds  Other Reaction(s): Other (See Comments)  Stomach bleeds, GI bleeding   Past Medical History:  Diagnosis Date   Acute myocardial infarction of other lateral wall, initial episode of care    Acute myocardial infarction, unspecified site, initial episode of care    Acute osteomyelitis    Acute osteomyelitis of left foot (HCC) 02/15/2018   Acute renal failure superimposed on stage 3a chronic kidney disease (HCC)    Allergic rhinitis    Anemia    Anginal pain    07/15/13- no chest pain in months   Anxiety    Bipolar affective (HCC)    CAD (coronary artery disease) 07/2011   s/p DES mid and distal RCA with 50% LAD   CKD stage 3 due to type 1 diabetes mellitus (HCC) 02/21/2015   Closed fracture of distal end of left radius 04/22/2020   Diabetic foot ulcer associated with diabetes mellitus due to underlying condition (HCC) 09/26/2018   Diabetic gastroparesis associated with type 1 diabetes mellitus (HCC)    Diabetic peripheral neuropathy associated with type 1 diabetes mellitus (HCC)    Diabetic  retinopathy    Esophageal stenosis    Esophageal ulcer    Esophagitis    Gastroparesis    Genital herpes    Reportedly tested and documented by [facility]--rare occurrences   GERD (gastroesophageal reflux disease)    Heart murmur    History of stomach ulcers    Hypertension    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   thelbert on 12/19/2012, that's the only one I've had (12/19/2012)   Migraines    Mixed diabetic hyperlipidemia associated with type 1 diabetes mellitus (HCC)    Nausea and vomiting  02/15/2018   Nausea vomiting and diarrhea 12/29/2012   Onychomycosis 02/07/2021   Orthopnoea    Paronychia of finger, left 02/07/2021   Pneumonia 2012   Polysubstance abuse (HCC)    Crack cocaine--none since 2008, MJ, ETOH:  clean of all since 2008   Rectal bleeding 01/10/2021   Renal lithiasis    Bilateral   Rheumatoid arthritis (HCC) 12/15/2008   Qualifier: Diagnosis of   By: Loretha MD, Victoria       Sebaceous cyst    Severe sepsis (HCC) 02/19/2022   Stroke (HCC) 2011   denies residual on 12/19/2012.  Years ago   Tinea cruris 05/13/2019   Type 1 diabetes mellitus with multiple complications (HCC) 06/21/2006   Centricity Description: GASTROPARESIS, DIABETIC  Qualifier: Diagnosis of   By: Loretha MD, Victoria      Centricity Description: DIABETIC PERIPHERAL NEUROPATHY  Qualifier: Diagnosis of   By: Loretha MD, Victoria      Diabetic Retinopathy     CKD     ASCAD       Unstable angina (HCC)    Vitamin D  deficiency    Past Surgical History:  Procedure Laterality Date   CORONARY ANGIOPLASTY WITH STENT PLACEMENT  01/20/2009   2 (12/19/2012)   CORONARY ANGIOPLASTY WITH STENT PLACEMENT  12/19/2012   2 (12/19/2012)   CORONARY PRESSURE/FFR STUDY N/A 08/13/2020   Procedure: INTRAVASCULAR PRESSURE WIRE/FFR STUDY;  Surgeon: Mady Bruckner, MD;  Location: [facility];  Service: Cardiovascular;  Laterality: N/A;   CORONARY STENT INTERVENTION N/A 08/13/2020   Procedure: CORONARY STENT  INTERVENTION;  Surgeon: Mady Bruckner, MD;  Location: [facility];  Service: Cardiovascular;  Laterality: N/A;   CYSTOSCOPY W/ URETERAL STENT PLACEMENT Left 02/19/2022   Procedure: CYSTOSCOPY WITH URETERAL STENT PLACEMENT;  Surgeon: Watt Rush, MD;  Location: [facility];  Service: Urology;  Laterality: Left;   CYSTOSCOPY/URETEROSCOPY/HOLMIUM LASER/STENT PLACEMENT Left 03/14/2022   Procedure: CYSTOSCOPY LEFT URETEROSCOPY/HOLMIUM LASER/STENT EXCHANGE;  Surgeon: Watt Rush, MD;  Location: [facility];  Service: Urology;  Laterality: Left;  1 HR FOR CASE   ESOPHAGOGASTRODUODENOSCOPY N/A 02/26/2015   Procedure: ESOPHAGOGASTRODUODENOSCOPY (EGD);  Surgeon: Rush Hint, MD;  Location: [facility];  Service: Endoscopy;  Laterality: N/A;   EYE SURGERY     Multiple surgeries of both eyes, including unsuccessful corneal transplant, left eye:  last laser was 09/08/2015 of right eye.  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   GAS INSERTION Left 07/16/2013   Procedure: INSERTION OF GAS;  Surgeon: Jestine Bunnell, MD;  Location: [facility] OR;  Service: Ophthalmology;  Laterality: Left;  SF6   GAS/FLUID EXCHANGE Left 07/30/2013   Procedure: GAS/FLUID EXCHANGE;  Surgeon: Jestine Bunnell, MD;  Location: [facility] OR;  Service: Ophthalmology;  Laterality: Left;   IRRIGATION AND DEBRIDEMENT SEBACEOUS CYST Right 03/01/2011   pointer (12/19/2012)   LEFT HEART CATH AND CORONARY ANGIOGRAPHY N/A 08/13/2020   Procedure: LEFT HEART CATH AND CORONARY ANGIOGRAPHY;  Surgeon: Mady Bruckner, MD;  Location: [facility];  Service: Cardiovascular;  Laterality: N/A;   LEFT HEART CATHETERIZATION WITH CORONARY ANGIOGRAM N/A 07/06/2011   Procedure: LEFT HEART CATHETERIZATION WITH CORONARY ANGIOGRAM;  Surgeon: Candyce GORMAN Reek, MD;  Location: [facility] CATH LAB;  Service: Cardiovascular;  Laterality: N/A;  possible PCI   LEFT HEART CATHETERIZATION WITH CORONARY ANGIOGRAM N/A 12/19/2012   Procedure: LEFT HEART CATHETERIZATION  WITH CORONARY ANGIOGRAM;  Surgeon: Candyce GORMAN Reek, MD;  Location: [facility] CATH LAB;  Service: Cardiovascular;  Laterality: N/A;  MEMBRANE PEEL Left 07/16/2013   Procedure: MEMBRANE PEEL;  Surgeon: Jestine Bunnell, MD;  Location: [facility] OR;  Service: Ophthalmology;  Laterality: Left;   PARS PLANA VITRECTOMY Left 07/16/2013   Procedure: PARS PLANA VITRECTOMY WITH 23 GAUGE;  Surgeon: Jestine Bunnell, MD;  Location: [facility] OR;  Service: Ophthalmology;  Laterality: Left;   PARS PLANA VITRECTOMY Left 07/30/2013   Procedure: PARS PLANA VITRECTOMY WITH 23 GAUGE WITH ENDOLASER;  Surgeon: Jestine Bunnell, MD;  Location: [facility] OR;  Service: Ophthalmology;  Laterality: Left;  with endolaser   PERCUTANEOUS CORONARY STENT INTERVENTION (PCI-S) N/A 07/06/2011   Procedure: PERCUTANEOUS CORONARY STENT INTERVENTION (PCI-S);  Surgeon: Candyce GORMAN Reek, MD;  Location: [facility] CATH LAB;  Service: Cardiovascular;  Laterality: N/A;   PHOTOCOAGULATION WITH LASER Left 07/16/2013   Procedure: PHOTOCOAGULATION WITH LASER;  Surgeon: Jestine Bunnell, MD;  Location: [facility] OR;  Service: Ophthalmology;  Laterality: Left;  ENDOLASER   Family History  Problem Relation Age of Onset   Breast cancer Mother 66   Hypertension Father    Stomach cancer Sister 60       cause of death   Hypertension Sister    Lung cancer Sister 72       Cause of death   Cancer Sister 36       ? stomach cancer   Colon cancer Maternal Uncle 87 - 59   Colon cancer Maternal Uncle 21 - 67   Social History   Socioeconomic History   Marital status: Divorced    Spouse name: Not on file   Number of children: 1   Years of education: 12   Highest education level: [employer] school graduate  Occupational History   Occupation: Disabled   Tobacco Use   Smoking status: Every Day    Current packs/day: 0.25    Average packs/day: 0.3 packs/day for 31.8 years (8.0 ttl pk-yrs)    Types: Cigarettes    Start date: 1994    Passive exposure: Current   Smokeless tobacco: Never   Tobacco  comments:    Has been to classes--not sure if she wants to quit.  Changing to non menthol..  Chantix , patches, gum never helped.  9/19:  lot going on.  Nicotine  nasal spray--makes her dream about using cocaine and so stopped. 05/2023:  smoking 5 cigs daily.Using QUIT  Vaping Use   Vaping status: Never Used  Substance and Sexual Activity   Alcohol use: Yes    Comment: Rare.wine.  History of alcohol use disorder.  Stopped in 2005   Drug use: Not Currently    Types: Crack cocaine, Marijuana    Comment: 01/01/2007  clean from crack.  Does still use edibles (marijuana) rarely   Sexual activity: Not Currently    Partners: Male    Birth control/protection: None  Other Topics Concern   Not on file  Social History Narrative   Has lived in [place] for most of life   Disabled   Previously went to [employer] school and worked as conservation officer, nature.   Lives by herself near [place].   Divorced in 2015.    She is caring for a middle school aged girl, Devon Sprang, also a patient here.  [name]'s father contacted Geet on social media and left daughter with her reportedly while he went to drug rehab, which reportedly is now not the case.  He finally signed custody over to Casha.     Devon ran away with her father--Izabella is not clear where they went.  Misa reported to authorities.  Social Drivers of Corporate Investment Banker Strain: Low Risk  (06/04/2024)   Overall Financial Resource Strain (CARDIA)    Difficulty of Paying Living Expenses: Not very hard  Food Insecurity: Food Insecurity Present (06/04/2024)   Hunger Vital Sign    Worried About Running Out of Food in the Last Year: Sometimes true    Ran Out of Food in the Last Year: Sometimes true  Transportation Needs: No Transportation Needs (06/04/2024)   PRAPARE - Administrator, Civil Service (Medical): No    Lack of Transportation (Non-Medical): No  Physical Activity: Not on file  Stress: Not on file  Social Connections: Unknown  (12/13/2021)   [facility]   Social Network    Social Network: Not on file  Intimate Partner Violence: Not At Risk (06/04/2024)   Humiliation, Afraid, Rape, and Kick questionnaire    Fear of Current or Ex-Partner: No    Emotionally Abused: No    Physically Abused: No    Sexually Abused: No     Review of Systems  Eyes:  Negative for visual disturbance (Had eye exam this year--no concerns, no need for laser or other procedure).      Objective:   BP (!) 140/78 (BP Location: Right Arm, Cuff Size: Normal)   Pulse 74   Resp 19   Wt 197 lb (89.4 kg)   LMP 01/04/2015   BMI 38.47 kg/m   Physical Exam Constitutional:      Appearance: She is obese.  HENT:     Head: Normocephalic and atraumatic.     Right Ear: Tympanic membrane, ear canal and external ear normal.     Left Ear: Tympanic membrane, ear canal and external ear normal.     Nose: Nose normal.     Mouth/Throat:     Mouth: Mucous membranes are moist.     Pharynx: Oropharynx is clear.     Comments: Upper and lower partials. Eyes:     Conjunctiva/sclera: Conjunctivae normal.     Comments: Right eye with pupil round and reactive to light.   Left eye with atrophy cornea scarred over, eyelids closed  Neck:     Thyroid: No thyroid mass or thyromegaly.  Cardiovascular:     Rate and Rhythm: Normal rate and regular rhythm.     Pulses:          Dorsalis pedis pulses are 2+ on the right side and 2+ on the left side.     Heart sounds: S1 normal and S2 normal. No murmur heard.    No friction rub. No S3 or S4 sounds.     Comments: No carotid bruits.  Carotid, radial, femoral, DP and PT pulses normal and equal.   Pulmonary:     Effort: Pulmonary effort is normal.     Breath sounds: Normal breath sounds and air entry.  Chest:  Breasts:    Right: No inverted nipple, mass or nipple discharge.     Left: No inverted nipple, mass or nipple discharge.  Abdominal:     General: Bowel sounds are normal.      Palpations: Abdomen is soft. There is no hepatomegaly, splenomegaly or mass.     Tenderness: There is no abdominal tenderness.     Hernia: No hernia is present.  Genitourinary:    General: Normal vulva.     Comments: No uterine or adnexal mass or tenderness--some abdominal wall tenderness on LLQ where injects insulin .  Musculoskeletal:  General: Normal range of motion.     Cervical back: Normal range of motion and neck supple.     Right lower leg: No edema.     Left lower leg: No edema.  Feet:     Right foot:     Protective Sensation: 10 sites tested.  10 sites sensed.     Skin integrity: Skin integrity normal.     Toenail Condition: Right toenails are abnormally thick.     Left foot:     Protective Sensation: 10 sites tested.  10 sites sensed.     Skin integrity: Skin integrity normal.     Toenail Condition: Left toenails are abnormally thick.  Lymphadenopathy:     Head:     Right side of head: No submental or submandibular adenopathy.     Left side of head: No submental or submandibular adenopathy.     Cervical: No cervical adenopathy.     Upper Body:     Right upper body: No supraclavicular or axillary adenopathy.     Left upper body: No supraclavicular or axillary adenopathy.     Lower Body: No right inguinal adenopathy. No left inguinal adenopathy.  Skin:    General: Skin is warm.     Capillary Refill: Capillary refill takes less than 2 seconds.     Comments: Intertriginous odor, but no rash or discharge noted in skin folds.  Neurological:     General: No focal deficit present.     Mental Status: She is alert and oriented to person, place, and time.     Cranial Nerves: Cranial nerves 2-12 are intact.     Sensory: Sensation is intact.     Motor: Motor function is intact.     Coordination: Coordination is intact.     Gait: Gait is intact.     Deep Tendon Reflexes: Reflexes are normal and symmetric.  Psychiatric:        Mood and Affect: Mood normal.        Speech:  Speech normal.        Behavior: Behavior normal. Behavior is cooperative.      Assessment & Plan   CPE no pap Mammogram done Due for colonoscopy again in 2027.  Followed closely by GI.   Pneumococcal 20 and Spikevax  COVID vaccines today.    2.  DM:  controlled.  In discussions with Dr Faythe about ?mounjaro or Ozempic for DM and weight loss.  She would like for me to contact him regarding his recommendations.  3.  Hyperlipidemia:  Will see if can add FLP to previously drawn labs.  If not, add to labs in 6 months as she is a difficult blood draw.    4.  RA:  as per Dr. Ziolkowska  5.  L middle finger injury:  as per ortho.  Dr. JENEANE reportedly would like for her to get her pain med Rx from ortho at this point until done with surgery and follow up.  As she did not tolerate oxycodone , encouraged her to ask about more frequent dosing of Tramadol .  Currently using only 50 mg once daily as needed.  She will be holding biologics before and after the surgery per Dr. JENEANE.  Same for plavix .  6.  CAD:  stable  7.  Hypertension:  a bit borderline today.  Will have her return in 1 month for bp check  8.  Osteopenia:  DEXA repeat.  Needs to take calcium  twice daily.

## 2024-06-06 ENCOUNTER — Telehealth: Payer: Self-pay | Admitting: Internal Medicine

## 2024-06-06 NOTE — Telephone Encounter (Signed)
 Patient called and states she came to an appointment last Wednesday she states she had covid vaccine and she is having body ache and bump on arm, aslo poor apetite and nausea

## 2024-06-06 NOTE — Telephone Encounter (Signed)
 Called patient and gave instructions, patient agreed.  Ask patient to call back if does not feel better.

## 2024-06-18 NOTE — Research (Signed)
 Typo made to [facility]  Correct box # is [id number]

## 2024-06-24 ENCOUNTER — Encounter: Payer: Self-pay | Admitting: Cardiology

## 2024-07-27 ENCOUNTER — Other Ambulatory Visit: Payer: Self-pay | Admitting: Internal Medicine

## 2024-08-14 ENCOUNTER — Ambulatory Visit: Admitting: Podiatry

## 2024-08-14 DIAGNOSIS — M79674 Pain in right toe(s): Secondary | ICD-10-CM

## 2024-08-14 DIAGNOSIS — M79675 Pain in left toe(s): Secondary | ICD-10-CM

## 2024-08-14 DIAGNOSIS — E1149 Type 2 diabetes mellitus with other diabetic neurological complication: Secondary | ICD-10-CM

## 2024-08-14 DIAGNOSIS — B351 Tinea unguium: Secondary | ICD-10-CM

## 2024-08-14 NOTE — Progress Notes (Signed)
 Subjective: Chief Complaint  Patient presents with   [facility]    IDDM Patient with an A1c of 6.7 presents today for Diabetic Foot care and nail trim     59 year old female presents the above concerns.  No open lesions.  She states that the toenails are thickened elongated she has difficulty trimming himself and causing pain.    PCP: Adella Norris, MD-last seen 06/04/2024 Endocrinologist: Dr. Reyes Alexander, MD- last seen 08/11/2024  Last A1c 6.7 on 05/30/2024  She is on plavix   Objective: AAO 3, NAD DP/PT pulses palpable, CRT less than 3 seconds Sensation decreased with Semmes Weinstein monofilament. Nails hypertrophic, dystrophic, elongated, brittle, discolored 10. There is tenderness overlying the nails 1-5 bilaterally. There is no surrounding erythema or drainage along the nail sites. Dry skin distal hallux with callus formation. No underlying ulceration, drainage, or signs of infection.  No ulcerations present bilaterally. Hammertoes present No pain with calf compression, swelling, warmth, erythema.  Symptomatic onychomycosis  Plan: -Treatment options including alternatives, risks, complications were discussed -Nails sharply debrided 10 without complication/bleeding. -Continue gabapentin  for neuropathy -Moisturizer (uses Miracle foot cream) -Discussed daily foot inspection. If there are any changes, to call the office immediately.   Return in about 3 months (around 11/12/2024).  Donnice Fees, DPM

## 2024-08-14 NOTE — Patient Instructions (Signed)

## 2024-08-20 ENCOUNTER — Encounter: Admitting: *Deleted

## 2024-08-20 DIAGNOSIS — Z006 Encounter for examination for normal comparison and control in clinical research program: Secondary | ICD-10-CM

## 2024-08-20 MED ORDER — STUDY - OCEAN(A) - OLPASIRAN (AMG 890) 142 MG/ML OR PLACEBO SQ INJECTION (PI-HILTY)
142.0000 mg | PREFILLED_SYRINGE | Freq: Once | SUBCUTANEOUS | Status: AC
  Administered 2024-08-20: 142 mg via SUBCUTANEOUS
  Filled 2024-08-20: qty 1

## 2024-08-20 NOTE — Research (Signed)
 [name] [name]  Week 96  Vitals: [x]  Experience any AE/SAE/Hospitalizations [x]  Yes []  No  If yes please explain: patient here this am with some chest pain - sugar is 373 - didn't take insulin  last night will report chest pains - no changes on EKG per Dr Mona, patient to go home and take her insulin  and tu EKG: [x]  0955 MD to see: [facility]  Labs collected:  1010   Discussed with patient about the importance of not letting any one draw cholesterol or lipids. As to we are blinded to results. Reassured patient if we needed to be contacted the study team would reach out to our unblinded person.   No labs at next visit 156    Non-Fatal Potential Endpoint Assessment Yes  No   Has the subject experienced/undergone any of the following since the last visit/contact?   []   [x]    Any Coronary Artery Revascularization/Cerebrovascular Revascularization/ Peripheral Artery Revascularization/Amputation Procedure   []   [x]    Myocardial Infarction []   [x]    Stroke   []   [x]    Provide the date for the non-fatal Potential Endpoints status as of today visit date    []   [x]      IP admin please see MAR (please add Box # to comment section on MAR) [x]   Current Medications[1]      [1]  Current Outpatient Medications:    acetaminophen  (TYLENOL ) 325 MG tablet, Take 2 tablets (650 mg total) by mouth every 6 (six) hours as needed for mild pain or fever., Disp: , Rfl:    albuterol  (VENTOLIN  HFA) 108 (90 Base) MCG/ACT inhaler, TAKE 2 PUFFS BY MOUTH EVERY 6 HOURS AS NEEDED FOR WHEEZE OR SHORTNESS OF BREATH, Disp: 18 each, Rfl: 1   ammonium lactate  (AMLACTIN) 12 % cream, APPLY TOPICALLY AS NEEDED FOR DRY SKIN, Disp: 420 g, Rfl: 1   Calcium -Magnesium -Vitamin D  (CITRACAL CALCIUM +D) 600-40-500 MG-MG-UNIT TB24, 1 tab by mouth daily, Disp: , Rfl:    clopidogrel  (PLAVIX ) 75 MG tablet, Take 1 tablet (75 mg total) by mouth daily. Please call [phone] to schedule an overdue appointment for future refills. Thank you. 1st  attempt., Disp: 90 tablet, Rfl: 2   Continuous Blood Gluc Sensor (DEXCOM G6 SENSOR) MISC, SMARTSIG:Topical Once [name] Month, Disp: , Rfl:    Continuous Blood Gluc Transmit (DEXCOM G6 TRANSMITTER) MISC, AS DIRECTED 90 DAYS, Disp: , Rfl:    [employer]S D3 25 MCG (1000 UT) capsule, TAKE 1 CAPSULE BY MOUTH EVERY DAY, Disp: 90 capsule, Rfl: 0   cyclobenzaprine  (FLEXERIL ) 10 MG tablet, TAKE 1-2 TABLETS (10-20 MG TOTAL) BY MOUTH AT BEDTIME AS NEEDED FOR MUSCLE SPASMS. MUSCLE SPASMS, Disp: 30 tablet, Rfl: 0   diphenhydrAMINE  (BENADRYL ) 25 MG tablet, Take by mouth., Disp: , Rfl:    ENBREL SURECLICK 50 MG/ML injection, Inject 50 mg into the skin once [name] week., Disp: , Rfl:    folic acid  (FOLVITE ) 1 MG tablet, Take 1 mg by mouth daily., Disp: , Rfl:    furosemide  (LASIX ) 40 MG tablet, TAKE 1 TABLET BY MOUTH EVERY DAY IN THE MORNING, Disp: 90 tablet, Rfl: 3   gabapentin  (NEURONTIN ) 100 MG capsule, Take 1 capsule (100 mg total) by mouth at bedtime. (Patient taking differently: Take 100 mg by mouth as needed (as needed to help sleep).), Disp: 90 capsule, Rfl: 3   HUMALOG KWIKPEN 100 UNIT/ML KwikPen, Inject 100 Units into the skin.  3 UNITS AT BREAKFAST, 3 units at LUNCH, 3 UNITS AT EVENING MEAL, PLUS 2 EXTRA  UNITS IF OVER 300 MG/DL Subcutaneous (max daily dose of 24 units) Subcutaneous for 90 days, Disp: , Rfl:    hydrOXYzine  (ATARAX ) 25 MG tablet, TAKE 1 TABLET BY MOUTH TWICE [name] DAY, Disp: 180 tablet, Rfl: 4   Incontinence Supply Disposable (POISE ULTRA THINS) PADS, Use 5 pads daily as needed for urinary stress incontinence, Disp: 150 each, Rfl: 11   LANTUS  SOLOSTAR 100 UNIT/ML Solostar Pen, Inject 12 Units into the skin 2 (two) times daily., Disp: , Rfl:    leflunomide (ARAVA) 10 MG tablet, Take 10 mg by mouth daily., Disp: , Rfl:    metoprolol  tartrate (LOPRESSOR ) 25 MG tablet, 1&1/2 TABS BY MOUTH TWICE DAILY, Disp: 270 tablet, Rfl: 1   nicotine  polacrilex (NICORETTE ) 4 MG gum, Take 1 each (4 mg total) by mouth as needed for  smoking cessation., Disp: 100 tablet, Rfl: 6   nitroGLYCERIN  (NITROSTAT ) 0.4 MG SL tablet, PLACE 1 TABLET UNDER THE TONGUE EVERY 5 MINUTES AS NEEDED FOR CHEST PAIN. MAX 3 DOSES, CALL 911., Disp: 26 tablet, Rfl: 6   ondansetron  (ZOFRAN ) 4 MG tablet, TAKE 1 TABLET BY MOUTH EVERY 6 HOURS AS NEEDED FOR NAUSEA AND VOMITING, Disp: 12 tablet, Rfl: 1   pantoprazole  (PROTONIX ) 40 MG tablet, Take 40 mg by mouth 2 (two) times daily., Disp: , Rfl:    phentermine 15 MG capsule, Take 15 mg by mouth daily., Disp: , Rfl:    prednisoLONE  acetate (PRED FORTE ) 1 % ophthalmic suspension, Place 4-5 drops into the left eye daily., Disp: , Rfl: 6   rosuvastatin  (CRESTOR ) 40 MG tablet, TAKE 1 TABLET BY MOUTH EVERY DAY, Disp: 90 tablet, Rfl: 3   sertraline  (ZOLOFT ) 100 MG tablet, Take 1 tablet (100 mg total) by mouth daily. 1 tab by mouth daily, Disp: 90 tablet, Rfl: 3   Study - [name]([name]) - olpasiran (AMG 890) 142 mg/mL or placebo SQ injection (PI-Hilty), Inject 142 mg into the skin once. For Investigational Use Only. Inject 1 mL (1 prefilled syringe) subcutaneously into appropriate injection site per protocol every 12 weeks in the clinic. (Approved injection site(s): upper arm, upper thigh & abdomen). Please contact [facility] with any questions or concerns regarding this medication., Disp: , Rfl:    traMADol  (ULTRAM ) 50 MG tablet, TAKE 1 TABLET EVERY 6 HOURS AS NEEDED FOR MODERATE PAIN, Disp: 90 tablet, Rfl: 2   Pramlintide Acetate 1500 MCG/1.5ML SOPN, Inject 15 mcg into the skin 3 (three) times daily before meals. (Patient not taking: Reported on 08/20/2024), Disp: , Rfl:

## 2024-08-25 ENCOUNTER — Other Ambulatory Visit: Payer: Self-pay | Admitting: Internal Medicine

## 2024-08-26 NOTE — Research (Addendum)
 Are there any labs that are clinically significant?  Yes []  OR No[x]   Please FORWARD back to me with any changes or follow up!  A few labs are slightly out of range, but not concerning.  Vinie KYM Maxcy, MD, [facility], FNLA, FACP  [place]  [facility]  Medical Director of the [facility]  [facility] Diplomate of the American Board of Clinical Lipidology Attending Cardiologist  Direct Dial: [phone]  Fax: [phone]  Website:  [email or web address]   ALERT FAX REPORT           Sponsor   : [employer].                        Investigator Name: Vinie Maxcy M.D.           Protocol    : [id number]                        Investigator ID  : [id number]            Patient ID       : [id number]                Alert Recipient  : Reena Lies           Patient Initials :                            Alert Fax Number : [phone]           Patient Sex      : F                          Alert Recipient DC : [id number]           Patient DOB      : 29-Jan-1966            Visit Description: [id number]                  Collection Date  : 20-Aug-2024           Accession Number : [id number]                 Collection Time  : 10:10            -----------------------------------------------------------------------------------------           FASTING GLUCOSE              Test Name       Result              Reference Range          Flags              Glucose         372                 70-100 mg/dL             HT                         Flag Key(s)             HT              [facility]reiser - Transmission  FASTING GLUCOSE                      Glucose        372     HT   70-100 mg/dL              [ ]   [ ]                                   IS SUBJECT FASTING?                      Fasting?       Yes                                        ANION GAP                      Anion Gap      24      H    7-18 mEq/L                [ ]   [ ]                                    EGFR                      CKDEPI eGF     54           mL/min/1.73m2                                                            No Ref Rng                        CHEMISTRY PANEL                      Total Bili     0.4          0.2-1.2 mg/dL                                D-BilGen2      0.19         0.00-0.36 mg/dL                              Ind Bili       0.2          0.0-1.2 mg/dL                                Alk Phos       180     H    35-104 U/L                [ ]   [ ]   ALT (SGPT)     7            4-43 U/L                                    AST (SGOT)     14           8-40 U/L                                    Urea Nitr      19           4-24 mg/dL                                   Creatinine     1.18    H    0.35-1.14 mg/dL           [ ]   [ ]                                     Calcium         9.2          8.3-10.6 mg/dL                              Total Prot     7.5          6.1-8.4 g/dL                                 Alb BCG        3.8          3.3-4.9 g/dL                                 CK             33           26-192 U/L                                   Sodium         142          132-147 mEq/L                                Potassium      3.6          3.5-5.2 mEq/L                                Bicarb         17.7    L    19.3-29.3 mEq/L           [ ]   [ ]   Chloride       104          94-112 mEq/L                               ADJUSTED CALCIUM                       Adj Calc       9.4          8.3-10.6 mg/dL                        HEMATOLOGY&DIFFERENTIAL PANEL                      HGB            13.8         11.6-16.4 g/dL                              HCT            42           34-48 %                                      RBC            4.9          4.1-5.6 x106/uL                              MCH            28           26-34 pg                                    MCHC           33            31-38 g/dL                                   RDW            15.5    H    12.0-15.0 %               [ ]   [ ]                                     RBC Morph      No Review Required                                       MCV            85           79-98 fL  WBC            7.72         3.80-10.70 x103/uL                           Neutrophil     5.10         1.96-7.23 x103/uL                           Lymphocyte     1.95         0.91-4.28 x103/uL                           Monocytes      0.49         0.12-0.92 x103/uL                           Eosinophil     0.13         0.00-0.57 x103/uL                           Basophils      0.04         0.00-0.20 x103/uL                           Neutrophil     66.1         40.5-75.0 %                                 Lymphocyte     25.2         15.4-48.5%                                   Monocytes      6.4          2.6-10.1 %                                   Eosinophil     1.7          0.0-6.8 %                                    Basophils      0.5          0.0-2.0 %                                    Platelets      278          140-400 x103/uL                            ANC                      ANC            5.10  1.96-7.23 x103/uL                    DECREASE >/= EGFR 50%                      eGFR > 50%     Criteria not met                      ALT > 3 X ULN                      ALT>3XULN      Criteria not met                                        ALT > 5 X ULN                      ALT>5XULN      Criteria not met                                        ALT > 8 X ULN                      ALT>8XULN      Criteria not met                                        ALT & TBIL                      ALT & TBIL     Criteria not met                                        AST > 3 X ULN                      AST>3XULN      Criteria not met                                        AST > 5 X ULN                       AST>5XULN      Criteria not met                                        AST > 8 X ULN                      AST>8XULN      Criteria not met                                        AST & TBIL  AST & TBIL     Criteria not met                       SM [id number] ANTIBODY COLL D/T                      CDate PreD     20-Aug-2024                                               CTime PreD     10:10                                                   SM [id number] LPA COLLECTION D/T                      Coll Date      20-Aug-2024                                               Coll Time      10:10                       HBA1C                      Hgb A1c        7.1     H    <6.5 %                    [ ]   [ ]                               COAGULATION GROUP                      APTT           22.9         21.9-29.4 sec                                PT             9.9          9.7-12.3 sec                                 INR            0.9          Patient not taking                                                       oral anticoagulant:  0.8 - 1.2                                                                Patient taking                                                          oral anticoagulant:                                                      2.0 - 3.0                                  ALT & INR                      ALT & INR      To follow                                              AST & INR                      AST & INR      To follow                         ALT & INR                      ALT & INR      Criteria not met                                        AST & INR                      AST & INR      Criteria not met

## 2024-11-13 ENCOUNTER — Ambulatory Visit: Admitting: Podiatry

## 2024-11-13 ENCOUNTER — Encounter

## 2024-11-28 ENCOUNTER — Other Ambulatory Visit

## 2024-12-04 ENCOUNTER — Ambulatory Visit: Admitting: Internal Medicine

## 2025-06-05 ENCOUNTER — Other Ambulatory Visit: Admitting: Internal Medicine

## 2025-06-09 ENCOUNTER — Encounter: Admitting: Internal Medicine
